# Patient Record
Sex: Female | Born: 1965
Health system: Southern US, Community
[De-identification: ages and names within clinical notes are randomized; demographics above are authoritative.]

## PROBLEM LIST (undated history)

## (undated) DIAGNOSIS — E669 Obesity, unspecified: Secondary | ICD-10-CM

## (undated) DIAGNOSIS — N184 Chronic kidney disease, stage 4 (severe): Secondary | ICD-10-CM

## (undated) DIAGNOSIS — M1990 Unspecified osteoarthritis, unspecified site: Secondary | ICD-10-CM

## (undated) DIAGNOSIS — R0609 Other forms of dyspnea: Secondary | ICD-10-CM

## (undated) DIAGNOSIS — E785 Hyperlipidemia, unspecified: Secondary | ICD-10-CM

## (undated) DIAGNOSIS — I428 Other cardiomyopathies: Secondary | ICD-10-CM

## (undated) DIAGNOSIS — E739 Lactose intolerance, unspecified: Secondary | ICD-10-CM

## (undated) DIAGNOSIS — Z87442 Personal history of urinary calculi: Secondary | ICD-10-CM

## (undated) DIAGNOSIS — D509 Iron deficiency anemia, unspecified: Secondary | ICD-10-CM

## (undated) DIAGNOSIS — D219 Benign neoplasm of connective and other soft tissue, unspecified: Secondary | ICD-10-CM

## (undated) DIAGNOSIS — I10 Essential (primary) hypertension: Secondary | ICD-10-CM

## (undated) DIAGNOSIS — R600 Localized edema: Secondary | ICD-10-CM

## (undated) DIAGNOSIS — R0600 Dyspnea, unspecified: Secondary | ICD-10-CM

## (undated) DIAGNOSIS — M549 Dorsalgia, unspecified: Secondary | ICD-10-CM

## (undated) DIAGNOSIS — Z973 Presence of spectacles and contact lenses: Secondary | ICD-10-CM

## (undated) DIAGNOSIS — K429 Umbilical hernia without obstruction or gangrene: Secondary | ICD-10-CM

## (undated) DIAGNOSIS — I493 Ventricular premature depolarization: Secondary | ICD-10-CM

## (undated) DIAGNOSIS — G4730 Sleep apnea, unspecified: Secondary | ICD-10-CM

## (undated) DIAGNOSIS — R0602 Shortness of breath: Secondary | ICD-10-CM

## (undated) DIAGNOSIS — K829 Disease of gallbladder, unspecified: Secondary | ICD-10-CM

## (undated) DIAGNOSIS — I499 Cardiac arrhythmia, unspecified: Secondary | ICD-10-CM

## (undated) DIAGNOSIS — I252 Old myocardial infarction: Secondary | ICD-10-CM

## (undated) DIAGNOSIS — I4891 Unspecified atrial fibrillation: Secondary | ICD-10-CM

## (undated) DIAGNOSIS — E11319 Type 2 diabetes mellitus with unspecified diabetic retinopathy without macular edema: Secondary | ICD-10-CM

## (undated) DIAGNOSIS — H269 Unspecified cataract: Secondary | ICD-10-CM

## (undated) DIAGNOSIS — R609 Edema, unspecified: Secondary | ICD-10-CM

## (undated) DIAGNOSIS — H35039 Hypertensive retinopathy, unspecified eye: Secondary | ICD-10-CM

## (undated) DIAGNOSIS — M2550 Pain in unspecified joint: Secondary | ICD-10-CM

## (undated) DIAGNOSIS — E119 Type 2 diabetes mellitus without complications: Secondary | ICD-10-CM

## (undated) HISTORY — DX: Unspecified cataract: H26.9

## (undated) HISTORY — DX: Unspecified osteoarthritis, unspecified site: M19.90

## (undated) HISTORY — DX: Dorsalgia, unspecified: M54.9

## (undated) HISTORY — DX: Pain in unspecified joint: M25.50

## (undated) HISTORY — DX: Type 2 diabetes mellitus with unspecified diabetic retinopathy without macular edema: E11.319

## (undated) HISTORY — PX: WISDOM TOOTH EXTRACTION: SHX21

## (undated) HISTORY — PX: CATARACT EXTRACTION: SUR2

## (undated) HISTORY — DX: Hyperlipidemia, unspecified: E78.5

## (undated) HISTORY — PX: CHOLECYSTECTOMY: SHX55

## (undated) HISTORY — DX: Lactose intolerance, unspecified: E73.9

## (undated) HISTORY — DX: Hypertensive retinopathy, unspecified eye: H35.039

## (undated) HISTORY — DX: Shortness of breath: R06.02

## (undated) HISTORY — DX: Old myocardial infarction: I25.2

## (undated) HISTORY — DX: Disease of gallbladder, unspecified: K82.9

---
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
## 2019-10-20 ENCOUNTER — Emergency Department (HOSPITAL_COMMUNITY): Payer: Self-pay

## 2019-10-20 ENCOUNTER — Other Ambulatory Visit: Payer: Self-pay

## 2019-10-20 ENCOUNTER — Encounter (HOSPITAL_COMMUNITY): Payer: Self-pay

## 2019-10-20 ENCOUNTER — Inpatient Hospital Stay (HOSPITAL_COMMUNITY)
Admission: EM | Admit: 2019-10-20 | Discharge: 2019-11-12 | DRG: 286 | Disposition: A | Discharge status: Home / Self Care | Payer: Self-pay | Attending: Internal Medicine | Admitting: Internal Medicine

## 2019-10-20 DIAGNOSIS — R8271 Bacteriuria: Secondary | ICD-10-CM | POA: Diagnosis present

## 2019-10-20 DIAGNOSIS — Z7901 Long term (current) use of anticoagulants: Secondary | ICD-10-CM

## 2019-10-20 DIAGNOSIS — N179 Acute kidney failure, unspecified: Secondary | ICD-10-CM | POA: Diagnosis present

## 2019-10-20 DIAGNOSIS — R739 Hyperglycemia, unspecified: Secondary | ICD-10-CM

## 2019-10-20 DIAGNOSIS — I248 Other forms of acute ischemic heart disease: Secondary | ICD-10-CM | POA: Diagnosis present

## 2019-10-20 DIAGNOSIS — I10 Essential (primary) hypertension: Secondary | ICD-10-CM

## 2019-10-20 DIAGNOSIS — K420 Umbilical hernia with obstruction, without gangrene: Secondary | ICD-10-CM | POA: Diagnosis present

## 2019-10-20 DIAGNOSIS — E1122 Type 2 diabetes mellitus with diabetic chronic kidney disease: Secondary | ICD-10-CM | POA: Diagnosis present

## 2019-10-20 DIAGNOSIS — I5023 Acute on chronic systolic (congestive) heart failure: Secondary | ICD-10-CM | POA: Diagnosis present

## 2019-10-20 DIAGNOSIS — I493 Ventricular premature depolarization: Secondary | ICD-10-CM | POA: Diagnosis present

## 2019-10-20 DIAGNOSIS — N1831 Chronic kidney disease, stage 3a: Secondary | ICD-10-CM | POA: Diagnosis present

## 2019-10-20 DIAGNOSIS — E119 Type 2 diabetes mellitus without complications: Secondary | ICD-10-CM

## 2019-10-20 DIAGNOSIS — D696 Thrombocytopenia, unspecified: Secondary | ICD-10-CM | POA: Diagnosis present

## 2019-10-20 DIAGNOSIS — Z79899 Other long term (current) drug therapy: Secondary | ICD-10-CM

## 2019-10-20 DIAGNOSIS — M25551 Pain in right hip: Secondary | ICD-10-CM

## 2019-10-20 DIAGNOSIS — R778 Other specified abnormalities of plasma proteins: Secondary | ICD-10-CM

## 2019-10-20 DIAGNOSIS — N3946 Mixed incontinence: Secondary | ICD-10-CM | POA: Diagnosis present

## 2019-10-20 DIAGNOSIS — R946 Abnormal results of thyroid function studies: Secondary | ICD-10-CM | POA: Diagnosis present

## 2019-10-20 DIAGNOSIS — D509 Iron deficiency anemia, unspecified: Secondary | ICD-10-CM | POA: Diagnosis present

## 2019-10-20 DIAGNOSIS — I130 Hypertensive heart and chronic kidney disease with heart failure and stage 1 through stage 4 chronic kidney disease, or unspecified chronic kidney disease: Secondary | ICD-10-CM | POA: Diagnosis present

## 2019-10-20 DIAGNOSIS — E1165 Type 2 diabetes mellitus with hyperglycemia: Secondary | ICD-10-CM | POA: Diagnosis present

## 2019-10-20 DIAGNOSIS — I509 Heart failure, unspecified: Secondary | ICD-10-CM

## 2019-10-20 DIAGNOSIS — I429 Cardiomyopathy, unspecified: Secondary | ICD-10-CM | POA: Diagnosis present

## 2019-10-20 DIAGNOSIS — E876 Hypokalemia: Secondary | ICD-10-CM | POA: Diagnosis not present

## 2019-10-20 DIAGNOSIS — Z87442 Personal history of urinary calculi: Secondary | ICD-10-CM

## 2019-10-20 DIAGNOSIS — Q519 Congenital malformation of uterus and cervix, unspecified: Secondary | ICD-10-CM

## 2019-10-20 DIAGNOSIS — N200 Calculus of kidney: Secondary | ICD-10-CM | POA: Diagnosis present

## 2019-10-20 DIAGNOSIS — D638 Anemia in other chronic diseases classified elsewhere: Secondary | ICD-10-CM | POA: Diagnosis present

## 2019-10-20 DIAGNOSIS — E8809 Other disorders of plasma-protein metabolism, not elsewhere classified: Secondary | ICD-10-CM | POA: Diagnosis present

## 2019-10-20 DIAGNOSIS — D259 Leiomyoma of uterus, unspecified: Secondary | ICD-10-CM | POA: Diagnosis present

## 2019-10-20 DIAGNOSIS — Z9114 Patient's other noncompliance with medication regimen: Secondary | ICD-10-CM

## 2019-10-20 DIAGNOSIS — T502X5A Adverse effect of carbonic-anhydrase inhibitors, benzothiadiazides and other diuretics, initial encounter: Secondary | ICD-10-CM | POA: Diagnosis not present

## 2019-10-20 DIAGNOSIS — I480 Paroxysmal atrial fibrillation: Principal | ICD-10-CM | POA: Diagnosis present

## 2019-10-20 DIAGNOSIS — Z20828 Contact with and (suspected) exposure to other viral communicable diseases: Secondary | ICD-10-CM | POA: Diagnosis present

## 2019-10-20 DIAGNOSIS — Z452 Encounter for adjustment and management of vascular access device: Secondary | ICD-10-CM

## 2019-10-20 DIAGNOSIS — I071 Rheumatic tricuspid insufficiency: Secondary | ICD-10-CM | POA: Diagnosis present

## 2019-10-20 DIAGNOSIS — Z794 Long term (current) use of insulin: Secondary | ICD-10-CM

## 2019-10-20 DIAGNOSIS — I4891 Unspecified atrial fibrillation: Secondary | ICD-10-CM | POA: Diagnosis present

## 2019-10-20 DIAGNOSIS — Z6841 Body Mass Index (BMI) 40.0 and over, adult: Secondary | ICD-10-CM

## 2019-10-20 DIAGNOSIS — R601 Generalized edema: Secondary | ICD-10-CM

## 2019-10-20 HISTORY — DX: Type 2 diabetes mellitus without complications: E11.9

## 2019-10-20 HISTORY — DX: Heart failure, unspecified: I50.9

## 2019-10-20 HISTORY — DX: Essential (primary) hypertension: I10

## 2019-10-20 LAB — CBG MONITORING, ED
Glucose-Capillary: 303 mg/dL — ABNORMAL HIGH (ref 70–99)
Glucose-Capillary: 328 mg/dL — ABNORMAL HIGH (ref 70–99)

## 2019-10-20 LAB — CBC WITH DIFFERENTIAL/PLATELET
Abs Immature Granulocytes: 0.02 10*3/uL (ref 0.00–0.07)
Basophils Absolute: 0.1 10*3/uL (ref 0.0–0.1)
Basophils Relative: 1 %
Eosinophils Absolute: 0.1 10*3/uL (ref 0.0–0.5)
Eosinophils Relative: 2 %
HCT: 39.7 % (ref 36.0–46.0)
Hemoglobin: 11.9 g/dL — ABNORMAL LOW (ref 12.0–15.0)
Immature Granulocytes: 0 %
Lymphocytes Relative: 19 %
Lymphs Abs: 1.2 10*3/uL (ref 0.7–4.0)
MCH: 24.1 pg — ABNORMAL LOW (ref 26.0–34.0)
MCHC: 30 g/dL (ref 30.0–36.0)
MCV: 80.5 fL (ref 80.0–100.0)
Monocytes Absolute: 0.6 10*3/uL (ref 0.1–1.0)
Monocytes Relative: 9 %
Neutro Abs: 4.3 10*3/uL (ref 1.7–7.7)
Neutrophils Relative %: 69 %
Platelets: UNDETERMINED 10*3/uL (ref 150–400)
RBC: 4.93 MIL/uL (ref 3.87–5.11)
RDW: 18.9 % — ABNORMAL HIGH (ref 11.5–15.5)
WBC: 6.2 10*3/uL (ref 4.0–10.5)
nRBC: 0 % (ref 0.0–0.2)

## 2019-10-20 LAB — POC URINE PREG, ED: Preg Test, Ur: NEGATIVE

## 2019-10-20 LAB — TSH: TSH: 4.672 u[IU]/mL — ABNORMAL HIGH (ref 0.350–4.500)

## 2019-10-20 LAB — BASIC METABOLIC PANEL
Anion gap: 11 (ref 5–15)
BUN: 31 mg/dL — ABNORMAL HIGH (ref 6–20)
CO2: 24 mmol/L (ref 22–32)
Calcium: 9.1 mg/dL (ref 8.9–10.3)
Chloride: 105 mmol/L (ref 98–111)
Creatinine, Ser: 1.76 mg/dL — ABNORMAL HIGH (ref 0.44–1.00)
GFR calc Af Amer: 38 mL/min — ABNORMAL LOW (ref >60)
GFR calc non Af Amer: 33 mL/min — ABNORMAL LOW (ref >60)
Glucose, Bld: 355 mg/dL — ABNORMAL HIGH (ref 70–99)
Potassium: 3.4 mmol/L — ABNORMAL LOW (ref 3.5–5.1)
Sodium: 140 mmol/L (ref 135–145)

## 2019-10-20 LAB — TROPONIN I (HIGH SENSITIVITY)
Troponin I (High Sensitivity): 103 ng/L (ref <18)
Troponin I (High Sensitivity): 83 ng/L — ABNORMAL HIGH (ref <18)

## 2019-10-20 LAB — BRAIN NATRIURETIC PEPTIDE: B Natriuretic Peptide: 1623.1 pg/mL — ABNORMAL HIGH (ref 0.0–100.0)

## 2019-10-20 LAB — MAGNESIUM: Magnesium: 2.1 mg/dL (ref 1.7–2.4)

## 2019-10-20 MED ORDER — ACETAMINOPHEN 325 MG PO TABS
650.0000 mg | ORAL_TABLET | Freq: Four times a day (QID) | ORAL | Status: DC | PRN
  Administered 2019-10-21 – 2019-11-05 (×5): 650 mg via ORAL
  Filled 2019-10-20 (×6): qty 2

## 2019-10-20 MED ORDER — FUROSEMIDE 10 MG/ML IJ SOLN
20.0000 mg | Freq: Two times a day (BID) | INTRAMUSCULAR | Status: DC
  Administered 2019-10-21 – 2019-10-22 (×3): 20 mg via INTRAVENOUS
  Filled 2019-10-20 (×2): qty 2
  Filled 2019-10-20: qty 4

## 2019-10-20 MED ORDER — TRAMADOL HCL 50 MG PO TABS
50.0000 mg | ORAL_TABLET | Freq: Four times a day (QID) | ORAL | Status: DC | PRN
  Administered 2019-10-21: 50 mg via ORAL
  Filled 2019-10-20: qty 1

## 2019-10-20 MED ORDER — POTASSIUM CHLORIDE CRYS ER 20 MEQ PO TBCR
40.0000 meq | EXTENDED_RELEASE_TABLET | Freq: Once | ORAL | Status: AC
  Administered 2019-10-21: 40 meq via ORAL
  Filled 2019-10-20: qty 2

## 2019-10-20 MED ORDER — METOPROLOL TARTRATE 5 MG/5ML IV SOLN
5.0000 mg | INTRAVENOUS | Status: DC | PRN
  Administered 2019-10-20: 5 mg via INTRAVENOUS
  Filled 2019-10-20: qty 5

## 2019-10-20 MED ORDER — ACETAMINOPHEN 650 MG RE SUPP: 650.0000 mg | Freq: Four times a day (QID) | RECTAL | Status: DC | PRN

## 2019-10-20 MED ORDER — FUROSEMIDE 10 MG/ML IJ SOLN
20.0000 mg | Freq: Once | INTRAMUSCULAR | Status: AC
  Administered 2019-10-20: 20 mg via INTRAVENOUS
  Filled 2019-10-20: qty 4

## 2019-10-20 MED ORDER — INSULIN ASPART 100 UNIT/ML ~~LOC~~ SOLN
0.0000 [IU] | Freq: Three times a day (TID) | SUBCUTANEOUS | Status: DC
  Administered 2019-10-21: 3 [IU] via SUBCUTANEOUS
  Administered 2019-10-21: 9 [IU] via SUBCUTANEOUS
  Administered 2019-10-21: 3 [IU] via SUBCUTANEOUS
  Administered 2019-10-21: 2 [IU] via SUBCUTANEOUS
  Administered 2019-10-22: 3 [IU] via SUBCUTANEOUS
  Administered 2019-10-22: 2 [IU] via SUBCUTANEOUS
  Administered 2019-10-22 – 2019-10-23 (×3): 3 [IU] via SUBCUTANEOUS
  Administered 2019-10-23: 5 [IU] via SUBCUTANEOUS
  Administered 2019-10-24: 12:00:00 3 [IU] via SUBCUTANEOUS
  Administered 2019-10-24: 5 [IU] via SUBCUTANEOUS
  Administered 2019-10-24: 2 [IU] via SUBCUTANEOUS
  Administered 2019-10-25: 5 [IU] via SUBCUTANEOUS
  Administered 2019-10-25: 2 [IU] via SUBCUTANEOUS
  Administered 2019-10-25 – 2019-10-26 (×2): 5 [IU] via SUBCUTANEOUS
  Administered 2019-10-26 – 2019-10-27 (×3): 3 [IU] via SUBCUTANEOUS
  Administered 2019-10-27 – 2019-10-28 (×2): 2 [IU] via SUBCUTANEOUS
  Administered 2019-10-28: 3 [IU] via SUBCUTANEOUS
  Administered 2019-10-28: 2 [IU] via SUBCUTANEOUS
  Administered 2019-10-29: 4 [IU] via SUBCUTANEOUS
  Administered 2019-10-29 – 2019-10-30 (×4): 2 [IU] via SUBCUTANEOUS
  Administered 2019-10-31: 1 [IU] via SUBCUTANEOUS
  Administered 2019-10-31: 12:00:00 2 [IU] via SUBCUTANEOUS
  Administered 2019-10-31 – 2019-11-01 (×2): 1 [IU] via SUBCUTANEOUS
  Administered 2019-11-01: 3 [IU] via SUBCUTANEOUS
  Administered 2019-11-01: 1 [IU] via SUBCUTANEOUS
  Administered 2019-11-02: 3 [IU] via SUBCUTANEOUS
  Administered 2019-11-02: 2 [IU] via SUBCUTANEOUS
  Administered 2019-11-02 – 2019-11-03 (×2): 3 [IU] via SUBCUTANEOUS
  Administered 2019-11-03 – 2019-11-04 (×5): 2 [IU] via SUBCUTANEOUS
  Administered 2019-11-05: 1 [IU] via SUBCUTANEOUS
  Administered 2019-11-05: 2 [IU] via SUBCUTANEOUS
  Administered 2019-11-05: 5 [IU] via SUBCUTANEOUS
  Administered 2019-11-06: 3 [IU] via SUBCUTANEOUS
  Administered 2019-11-06: 2 [IU] via SUBCUTANEOUS
  Administered 2019-11-06: 3 [IU] via SUBCUTANEOUS
  Administered 2019-11-07 – 2019-11-08 (×3): 2 [IU] via SUBCUTANEOUS
  Administered 2019-11-08: 1 [IU] via SUBCUTANEOUS
  Administered 2019-11-08 – 2019-11-11 (×8): 2 [IU] via SUBCUTANEOUS
  Administered 2019-11-11: 1 [IU] via SUBCUTANEOUS
  Administered 2019-11-11: 3 [IU] via SUBCUTANEOUS
  Administered 2019-11-12: 07:00:00 1 [IU] via SUBCUTANEOUS
  Filled 2019-10-20: qty 0.09

## 2019-10-20 MED ORDER — METOPROLOL TARTRATE 5 MG/5ML IV SOLN
5.0000 mg | Freq: Once | INTRAVENOUS | Status: AC
  Administered 2019-10-20: 5 mg via INTRAVENOUS
  Filled 2019-10-20: qty 5

## 2019-10-20 MED ORDER — INSULIN ASPART 100 UNIT/ML ~~LOC~~ SOLN
0.0000 [IU] | Freq: Every day | SUBCUTANEOUS | Status: DC
  Administered 2019-10-20: 4 [IU] via SUBCUTANEOUS
  Administered 2019-10-21: 2 [IU] via SUBCUTANEOUS
  Administered 2019-10-22: 3 [IU] via SUBCUTANEOUS
  Administered 2019-10-23: 2 [IU] via SUBCUTANEOUS
  Administered 2019-10-24: 4 [IU] via SUBCUTANEOUS
  Administered 2019-10-25 – 2019-10-26 (×2): 3 [IU] via SUBCUTANEOUS
  Administered 2019-10-27 – 2019-11-05 (×5): 2 [IU] via SUBCUTANEOUS
  Administered 2019-11-10: 3 [IU] via SUBCUTANEOUS
  Filled 2019-10-20: qty 0.05

## 2019-10-20 NOTE — ED Triage Notes (Signed)
Pt presents with c/o fall that occurred on Thursday night. Pt reports she was not initially feeling the pain after the fall but is now feeling pain in her right thigh. Pt does report pain with ambulation.

## 2019-10-20 NOTE — H&P (Signed)
History and Physical    Adrienne Lane ALP:[id number] DOB: [birth year] DOA: 10/20/2019  PCP: Patient, No Pcp Per Patient coming from: Home  Chief Complaint: Right hip pain  HPI: Adrienne Lane is a 53 y.o. female with medical history significant of type 2 diabetes and hypertension presenting with a chief complaint of right hip pain.  Patient states 2 days ago she was trying to get out of her [place] and her leg got stuck.  She lost her balance and fell on her right hip.  Since then she has been having pain in this area when standing or walking.  No pain at rest.  Denies head injury from the fall.  Denies any headaches or neck pain.  Denies loss of consciousness.  States she moved from [place] to [place] in December 2019 and could no longer qualify for Medicaid here.  Since then, she has not seen a doctor and has not been on any medications for her diabetes and hypertension.  She was previously on insulin for her diabetes.  States she was told about 2.5 to 3 years ago that her heart rate was irregular.  She was referred to cardiology back in [place] about 1.5 years ago but it was just an initial visit and she was not started on any medications.  States her legs have been swollen and abdomen distended for the past 4 months.  Her abdomen feels sore all over.  No nausea, vomiting, or diarrhea.  She is tolerating p.o. intake and requesting food to eat.  Also endorsing orthopnea.  Denies chest pain.  No other complaints.  ED Course: Found to be in A. fib with RVR with rate up to 140s.  Blood pressure elevated.  No leukocytosis.  Hemoglobin 11.9, MCV 80.5.  Potassium 3.4.  Magnesium normal.  Blood glucose 355.  Bicarb 24, anion gap 11.  BUN 31, creatinine 1.7.  TSH borderline elevated at 4.672.  High-sensitivity troponin 103.  BNP 1623.  Chest x-ray without pulmonary edema.  SARS-CoV-2 test pending.  X-ray of right hip and pelvis negative for fracture or dislocation. Patient received IV Lasix 20 mg and  IV Lopressor 5 mg.  Review of Systems:  All systems reviewed and apart from history of presenting illness, are negative.  Past Medical History:  Diagnosis Date  . Diabetes mellitus without complication ([place])   . Hypertension     Past Surgical History:  Procedure Laterality Date  . CHOLECYSTECTOMY       reports that she has never smoked. She has never used smokeless tobacco. She reports that she does not drink alcohol or use drugs.  No Known Allergies  Family History  Problem Relation Age of Onset  . Atrial fibrillation Mother     Prior to Admission medications   Not on File    Physical Exam: Vitals:   10/20/19 2107 10/20/19 2135 10/20/19 2230 10/20/19 2245  BP: (!) 152/105 (!) 181/114 (!) 143/101   Pulse: 82  (!) 124 83  Resp: (!) 1 (!) 25 18 (!) 24  SpO2: 95% 100% 99% 99%    Physical Exam  Constitutional: She is oriented to person, place, and time. She appears well-developed and well-nourished. No distress.  HENT:  Head: Normocephalic.  Eyes: Right eye exhibits no discharge. Left eye exhibits no discharge.  Neck: Neck supple.  Cardiovascular: Intact distal pulses.  Heart rate in the 120s Irregularly irregular rhythm  Pulmonary/Chest: Effort normal and breath sounds normal. No respiratory distress. She has no wheezes. She  has no rales.  Abdominal: Soft. Bowel sounds are normal. She exhibits distension.  Mild generalized tenderness to palpation No guarding, rebound, or rigidity  Musculoskeletal:        General: Edema present.     Comments: +3 pitting edema of bilateral lower extremities  Neurological: She is alert and oriented to person, place, and time.  Skin: Skin is warm and dry. She is not diaphoretic.     Labs on Admission: I have personally reviewed following labs and imaging studies  CBC: Recent Labs  Lab 10/20/19 1958  WBC 6.2  NEUTROABS 4.3  HGB 11.9*  HCT 39.7  MCV 80.5  PLT PLATELET CLUMPS NOTED ON SMEAR, UNABLE TO ESTIMATE   Basic  Metabolic Panel: Recent Labs  Lab 10/20/19 1958 10/20/19 2028  NA 140  --   K 3.4*  --   CL 105  --   CO2 24  --   GLUCOSE 355*  --   BUN 31*  --   CREATININE 1.76*  --   CALCIUM 9.1  --   MG  --  2.1   GFR: CrCl cannot be calculated (Unknown ideal weight.). Liver Function Tests: No results for input(s): AST, ALT, ALKPHOS, BILITOT, PROT, ALBUMIN in the last 168 hours. No results for input(s): LIPASE, AMYLASE in the last 168 hours. No results for input(s): AMMONIA in the last 168 hours. Coagulation Profile: No results for input(s): INR, PROTIME in the last 168 hours. Cardiac Enzymes: No results for input(s): CKTOTAL, CKMB, CKMBINDEX, TROPONINI in the last 168 hours. BNP (last 3 results) No results for input(s): PROBNP in the last 8760 hours. HbA1C: No results for input(s): HGBA1C in the last 72 hours. CBG: Recent Labs  Lab 10/20/19 2036 10/20/19 2324  GLUCAP 303* 328*   Lipid Profile: No results for input(s): CHOL, HDL, LDLCALC, TRIG, CHOLHDL, LDLDIRECT in the last 72 hours. Thyroid Function Tests: Recent Labs    10/20/19 2028  TSH 4.672*   Anemia Panel: No results for input(s): VITAMINB12, FOLATE, FERRITIN, TIBC, IRON, RETICCTPCT in the last 72 hours. Urine analysis: No results found for: COLORURINE, APPEARANCEUR, LABSPEC, [place], GLUCOSEU, HGBUR, BILIRUBINUR, KETONESUR, PROTEINUR, UROBILINOGEN, NITRITE, LEUKOCYTESUR  Radiological Exams on Admission: Dg Chest 1 View  Result Date: 10/20/2019 CLINICAL DATA:  Fall 2 days ago.  Shortness of breath. EXAM: CHEST  1 VIEW COMPARISON:  None. FINDINGS: The heart size and mediastinal contours are within normal limits. Both lungs are clear. No evidence of pneumothorax or pleural effusion. The visualized skeletal structures are unremarkable. IMPRESSION: No active disease. Electronically Signed   By: Marlaine Hind M.D.   On: 10/20/2019 22:07   Dg Hip Unilat With Pelvis 2-3 Views Right  Result Date: 10/20/2019 CLINICAL  DATA:  Fall 2 days ago. Right hip pain. Initial encounter. EXAM: DG HIP (WITH OR WITHOUT PELVIS) 2-3V RIGHT COMPARISON:  None. FINDINGS: There is no evidence of hip fracture or dislocation. There is no evidence of arthropathy or other focal bone abnormality. IMPRESSION: Negative. Electronically Signed   By: Marlaine Hind M.D.   On: 10/20/2019 22:07    EKG: Independently reviewed.  EKG with irregular rhythm and some visible P waves.  Heart rate 147.  Assessment/Plan Principal Problem:   Atrial fibrillation with rapid ventricular response (HCC) Active Problems:   CHF (congestive heart failure) (HCC)   Elevated troponin   AKI (acute kidney injury) (HCC)   Hyperglycemia   Probable A. fib with RVR Possibly precipitated by volume overload from new onset CHF.  EKG  with irregular rhythm although does show some visible P waves.  Rate up to 140s on arrival to the ED.  Per patient, she has had an irregular heart rhythm for the past 2.5-3 years and was previously referred to cardiology in [place].  Currently not on a rate control agent or anticoagulation.  CHA2DS2VASc 3. -Cardiac monitoring -Received IV Lopressor 5 mg in the ED.  Continues to be in A. fib with rate in the 120s.  Blood pressure stable.  Will give additional dose of Lopressor.  If rate still uncontrolled, will need Cardizem infusion. -IV Heparin for anticoagulation -Echocardiogram -Repeat EKG in a.m. -Consult cardiology in a.m.  New onset CHF Patient reports 66-month history of bilateral lower extremity edema, orthopnea, and abdominal distention.  Chest x-ray without pulmonary edema.  However, BNP significantly elevated at 1623. -Received IV Lasix 20 mg daily.  Continue IV Lasix 20 mg twice daily starting in the morning. -Echocardiogram -Monitor intake and output, daily weights, low-sodium diet with fluid restriction  Elevated troponin Likely secondary to demand ischemia in the setting of A. fib with RVR and new onset CHF.   High-sensitivity troponin 103.  EKG not suggestive of ACS.  Patient denies chest pain and appears comfortable. -Cardiac monitoring -Trend troponin  AKI BUN 31, creatinine 1.7.  Baseline creatinine 0.9-1.0 in 2019 per care everywhere.  Possibly cardiorenal due to decompensated CHF and vascular congestion from volume overload. -IV Lasix as above -Monitor urine output -Avoid nephrotoxic agents/contrast  Hyperglycemia in the setting of untreated type 2 diabetes Patient was previously on insulin but has not been on any medications since December 2019 due to lack of insurance.  Blood glucose elevated at 355.  No signs of DKA. -Check A1c.  Sliding scale insulin sensitive with bedtime coverage.  Elevated blood pressure in the setting of untreated hypertension Patient has not been on any antihypertensives since December 2019.  Received Lopressor for A. fib with RVR.  Systolic currently in 176H to 150s. -IV Lopressor as needed, may need Cardizem infusion if heart rate continues to be uncontrolled.  Right hip pain secondary to recent mechanical fall X-ray of right hip and pelvis negative for fracture or dislocation. -Tylenol as needed, tramadol as needed -PT evaluation -Fall precautions  Normocytic anemia Stable.  Hemoglobin 11.9, MCV 80.5.  Hemoglobin ranging between 9.3-10.3 in 2019 per care everywhere. -Check iron, ferritin, TIBC -Patient will need outpatient GI follow-up for screening colonoscopy given age above 40.  Mild hypokalemia Potassium 3.4.  Magnesium normal. -Replete potassium.  Continue to monitor electrolytes.  Borderline elevated TSH TSH borderline elevated at 4.672. -Check free T4 level  Patient needs prescription drug assistance -Care management consult  HIV screening The patient falls between the ages of 13-64 and should be screened for HIV, therefore HIV testing ordered.  DVT prophylaxis: Heparin Code Status: Full code Family Communication: No family available.  Disposition Plan: Anticipate discharge after clinical improvement. Consults called: None Admission status: It is my clinical opinion that admission to INPATIENT is reasonable and necessary in this 53 y.o. female . presenting with probable A. fib with RVR, new onset CHF, AKI, hyperglycemia in the setting of untreated type 2 diabetes, and elevated blood pressure in the setting of untreated hypertension.  Will need cardiology consultation in a.m.  Given the aforementioned, the predictability of an adverse outcome is felt to be significant. I expect that the patient will require at least 2 midnights in the hospital to treat this condition.   The medical decision making on  this patient was of high complexity and the patient is at high risk for clinical deterioration, therefore this is a level 3 visit.  Shela Leff MD [facility] Pager [phone]  If 7PM-7AM, please contact night-coverage [email or web address] Password [email or web address]  10/21/2019, 12:02 AM

## 2019-10-20 NOTE — ED Provider Notes (Signed)
[facility] DEPT Provider Note   CSN: [id number] Arrival date & time: 10/20/19  1531     History   Chief Complaint Chief Complaint  Patient presents with  . Fall    HPI Adrienne Lane is a 53 y.o. female.  She has a history of diabetes and hypertension and has been off her meds for about 10 months due to lack of insurance.  She said she had a fall 2 nights ago and landed on her right buttock and since then has had pain with ambulation.  Most of the pain occurs when she is transitioning from sitting to standing.  She says her legs feel weaker since then.  She has noticed increased shortness of breath especially when lying down flat that been going on for months.  She denies any chest pain.     The history is provided by the patient.  Fall This is a new problem. The current episode started 2 days ago. The problem occurs constantly. The problem has not changed since onset.Associated symptoms include shortness of breath. Pertinent negatives include no chest pain, no abdominal pain and no headaches. The symptoms are aggravated by standing and walking. The symptoms are relieved by rest. She has tried rest for the symptoms. The treatment provided mild relief.    Past Medical History:  Diagnosis Date  . Diabetes mellitus without complication ([place])   . Hypertension     There are no active problems to display for this patient.   Past Surgical History:  Procedure Laterality Date  . CHOLECYSTECTOMY       OB History   No obstetric history on file.      Home Medications    Prior to Admission medications   Not on File    Family History No family history on file.  Social History Social History   Tobacco Use  . Smoking status: Never Smoker  . Smokeless tobacco: Never Used  Substance Use Topics  . Alcohol use: Never    Frequency: Never  . Drug use: Never     Allergies   Patient has no known allergies.   Review of Systems Review of  Systems  Constitutional: Negative for fever.  HENT: Negative for sore throat.   Eyes: Negative for visual disturbance.  Respiratory: Positive for shortness of breath.   Cardiovascular: Negative for chest pain.  Gastrointestinal: Negative for abdominal pain.  Genitourinary: Negative for dysuria.  Musculoskeletal: Negative for back pain.  Skin: Negative for rash.  Neurological: Positive for tremors. Negative for headaches.     Physical Exam Updated Vital Signs BP (!) 139/114   Pulse (!) 105   Temp 97.8 F (36.6 C) (Oral)   Resp 14   Ht 5\' 8"  (1.727 m)   Wt 127 kg   LMP 10/20/2019 (Approximate)   SpO2 99%   BMI 42.57 kg/m   Physical Exam Vitals signs and nursing note reviewed.  Constitutional:      General: She is not in acute distress.    Appearance: She is well-developed.  HENT:     Head: Normocephalic and atraumatic.  Eyes:     Conjunctiva/sclera: Conjunctivae normal.  Neck:     Musculoskeletal: Neck supple.  Cardiovascular:     Rate and Rhythm: Tachycardia present. Rhythm irregular.     Pulses: Normal pulses.     Heart sounds: No murmur.  Pulmonary:     Effort: Pulmonary effort is normal. No respiratory distress.     Breath sounds: Normal breath sounds.  Abdominal:     Palpations: Abdomen is soft.     Tenderness: There is no abdominal tenderness. There is no guarding.  Musculoskeletal:        General: No deformity.     Right lower leg: Edema present.     Left lower leg: Edema present.  Skin:    General: Skin is warm and dry.     Capillary Refill: Capillary refill takes less than 2 seconds.  Neurological:     General: No focal deficit present.     Mental Status: She is alert.     Sensory: No sensory deficit.     Motor: No weakness.      ED Treatments / Results  Labs (all labs ordered are listed, but only abnormal results are displayed) Labs Reviewed  BASIC METABOLIC PANEL - Abnormal; Notable for the following components:      Result Value    Potassium 3.4 (*)    Glucose, Bld 355 (*)    BUN 31 (*)    Creatinine, Ser 1.76 (*)    GFR calc non Af Amer 33 (*)    GFR calc Af Amer 38 (*)    All other components within normal limits  CBC WITH DIFFERENTIAL/PLATELET - Abnormal; Notable for the following components:   Hemoglobin 11.9 (*)    MCH 24.1 (*)    RDW 18.9 (*)    All other components within normal limits  TSH - Abnormal; Notable for the following components:   TSH 4.672 (*)    All other components within normal limits  BRAIN NATRIURETIC PEPTIDE - Abnormal; Notable for the following components:   B Natriuretic Peptide 1,623.1 (*)    All other components within normal limits  HEMOGLOBIN A1C - Abnormal; Notable for the following components:   Hgb A1c MFr Bld 11.7 (*)    All other components within normal limits  CBC - Abnormal; Notable for the following components:   Hemoglobin 10.4 (*)    HCT 35.1 (*)    MCH 23.9 (*)    MCHC 29.6 (*)    RDW 18.6 (*)    All other components within normal limits  BASIC METABOLIC PANEL - Abnormal; Notable for the following components:   Glucose, Bld 180 (*)    BUN 33 (*)    Creatinine, Ser 1.90 (*)    GFR calc non Af Amer 30 (*)    GFR calc Af Amer 35 (*)    All other components within normal limits  APTT - Abnormal; Notable for the following components:   aPTT >200 (*)    All other components within normal limits  PROTIME-INR - Abnormal; Notable for the following components:   Prothrombin Time 18.2 (*)    INR 1.5 (*)    All other components within normal limits  PROTIME-INR - Abnormal; Notable for the following components:   Prothrombin Time 16.9 (*)    INR 1.4 (*)    All other components within normal limits  APTT - Abnormal; Notable for the following components:   aPTT 132 (*)    All other components within normal limits  CBG MONITORING, ED - Abnormal; Notable for the following components:   Glucose-Capillary 303 (*)    All other components within normal limits  CBG  MONITORING, ED - Abnormal; Notable for the following components:   Glucose-Capillary 328 (*)    All other components within normal limits  CBG MONITORING, ED - Abnormal; Notable for the following components:   Glucose-Capillary 379 (*)  All other components within normal limits  CBG MONITORING, ED - Abnormal; Notable for the following components:   Glucose-Capillary 225 (*)    All other components within normal limits  TROPONIN I (HIGH SENSITIVITY) - Abnormal; Notable for the following components:   Troponin I (High Sensitivity) 103 (*)    All other components within normal limits  TROPONIN I (HIGH SENSITIVITY) - Abnormal; Notable for the following components:   Troponin I (High Sensitivity) 83 (*)    All other components within normal limits  SARS CORONAVIRUS 2 (TAT 6-24 HRS)  MAGNESIUM  IRON AND TIBC  FERRITIN  HEPARIN LEVEL (UNFRACTIONATED)  T4, FREE  POC URINE PREG, ED    EKG EKG Interpretation  Date/Time:  Saturday October 20 2019 20:21:30 EDT Ventricular Rate:  149 PR Interval:    QRS Duration: 65 QT Interval:  271 QTC Calculation: 427 R Axis:   10 Text Interpretation: probable afib with RVR Nonspecific T abnormalities, lateral leads No old tracing to compare Confirmed by Aletta Edouard [phone]) on 10/20/2019 8:30:16 PM   Radiology Dg Chest 1 View  Result Date: 10/20/2019 CLINICAL DATA:  Fall 2 days ago.  Shortness of breath. EXAM: CHEST  1 VIEW COMPARISON:  None. FINDINGS: The heart size and mediastinal contours are within normal limits. Both lungs are clear. No evidence of pneumothorax or pleural effusion. The visualized skeletal structures are unremarkable. IMPRESSION: No active disease. Electronically Signed   By: Marlaine Hind M.D.   On: 10/20/2019 22:07   Dg Hip Unilat With Pelvis 2-3 Views Right  Result Date: 10/20/2019 CLINICAL DATA:  Fall 2 days ago. Right hip pain. Initial encounter. EXAM: DG HIP (WITH OR WITHOUT PELVIS) 2-3V RIGHT COMPARISON:  None.  FINDINGS: There is no evidence of hip fracture or dislocation. There is no evidence of arthropathy or other focal bone abnormality. IMPRESSION: Negative. Electronically Signed   By: Marlaine Hind M.D.   On: 10/20/2019 22:07    Procedures .Critical Care Performed by: Hayden Rasmussen, MD Authorized by: Hayden Rasmussen, MD   Critical care provider statement:    Critical care time (minutes):  45   Critical care was necessary to treat or prevent imminent or life-threatening deterioration of the following conditions:  Circulatory failure   Critical care was time spent personally by me on the following activities:  Discussions with consultants, evaluation of patient's response to treatment, examination of patient, ordering and performing treatments and interventions, ordering and review of laboratory studies, ordering and review of radiographic studies, pulse oximetry, re-evaluation of patient's condition, obtaining history from patient or surrogate, review of old charts and development of treatment plan with patient or surrogate   (including critical care time)  Medications Ordered in ED Medications  furosemide (LASIX) injection 20 mg (20 mg Intravenous Given 10/21/19 0837)  acetaminophen (TYLENOL) tablet 650 mg (has no administration in time range)    Or  acetaminophen (TYLENOL) suppository 650 mg (has no administration in time range)  insulin aspart (novoLOG) injection 0-9 Units (3 Units Subcutaneous Given 10/21/19 0836)  insulin aspart (novoLOG) injection 0-5 Units (4 Units Subcutaneous Given 10/20/19 2356)  traMADol (ULTRAM) tablet 50 mg (50 mg Oral Given 10/21/19 0039)  heparin ADULT infusion 100 units/mL (25000 units/271mL sodium chloride 0.45%) (1,200 Units/hr Intravenous New Bag/Given 10/21/19 0148)  diltiazem (CARDIZEM) injection 5 mg (has no administration in time range)  metoprolol tartrate (LOPRESSOR) injection 5 mg (5 mg Intravenous Given 10/20/19 2137)  furosemide (LASIX) injection  20 mg (20 mg Intravenous Given  10/20/19 2255)  potassium chloride SA (KLOR-CON) CR tablet 40 mEq (40 mEq Oral Given 10/21/19 0001)  heparin bolus via infusion 5,000 Units (5,000 Units Intravenous Bolus from Bag 10/21/19 0145)     Initial Impression / Assessment and Plan / ED Course  I have reviewed the triage vital signs and the nursing notes.  Pertinent labs & imaging results that were available during my care of the patient were reviewed by me and considered in my medical decision making (see chart for details).  Clinical Course as of Oct 20 940  Sat Oct 20, 5651  6817 53 year old female with untreated diabetes hypertension here after a fall with pain in her right hip and buttock area.  She is tacky here irregular in the 140s, cannot lay down flat because she gets so short of breath.  Fingerstick as her sugar at 330.  She said she has no PCP and no insurance.  Ultimately I think she is probably getting need to be admitted to the hospital.  Order some IV Lopressor to see if we can slow her down a little bit although she got particularly symptomatic as far as having chest pain.  Her rhythm does show some P waves but I think ultimately is probably A. fib.  She said she is always been a regular and never had it tested so she likely has been in greater than 72 hours and would not consider cardioversion.  She will probably need admission to the hospital for cardiology evaluation and will need social work case management regarding getting her on medications and outpatient follow-up.  We will sign this out to the oncoming team.   [MB]  2150 Patient has some vital signs in the record that show her heart rate being in the 80s and 90s.  Every time that have been in the room she is consistently been 120-150.   [MB]    Clinical Course User Index [MB] Hayden Rasmussen, MD      CHA2DS2/VAS Stroke Risk Points  Current as of 51 minutes ago     3 >= 2 Points: High Risk  1 - 1.99 Points: Medium Risk  0  Points: Low Risk    The previous score was 2 on 10/20/2019.: Last Change:     Details    This score determines the patient's risk of having a stroke if the  patient has atrial fibrillation.       Points Metrics  1 Has Congestive Heart Failure:  Yes    Current as of 51 minutes ago  0 Has Vascular Disease:  No    Current as of 51 minutes ago  1 Has Hypertension:  Yes    Current as of 51 minutes ago  0 Age:  42    Current as of 51 minutes ago  0 Has Diabetes:  No    Current as of 51 minutes ago  0 Had Stroke:  No  Had TIA:  No  Had thromboembolism:  No    Current as of 51 minutes ago  1 Female:  Yes    Current as of 51 minutes ago            Final Clinical Impressions(s) / ED Diagnoses   Final diagnoses:  Atrial fibrillation with RVR ([place])  Essential hypertension  Hyperglycemia  Right hip pain  Elevated troponin    ED Discharge Orders    None       Hayden Rasmussen, MD 10/21/19 [phone]

## 2019-10-20 NOTE — ED Notes (Signed)
Date and time results received: 10/20/19 22:10 (use smartphrase ".now" to insert current time)  Test: Tropnin Critical Value: 103  Name of Provider Notified: Eulis Foster

## 2019-10-20 NOTE — ED Notes (Signed)
Changed pt acuity from level 4 to 2.

## 2019-10-20 NOTE — Progress Notes (Signed)
ANTICOAGULATION CONSULT NOTE - Initial Consult  Pharmacy Consult for IV heparin Indication: atrial fibrillation  No Known Allergies  Patient Measurements:   Heparin Dosing Weight: 83 kg  Vital Signs: BP: 143/101 (10/31 2230) Pulse Rate: 83 (10/31 2245)  Labs: Recent Labs    10/20/19 1958 10/20/19 2028  HGB 11.9*  --   HCT 39.7  --   PLT PLATELET CLUMPS NOTED ON SMEAR, UNABLE TO ESTIMATE  --   CREATININE 1.76*  --   TROPONINIHS  --  103*    CrCl cannot be calculated (Unknown ideal weight.).   Medical History: Past Medical History:  Diagnosis Date  . Diabetes mellitus without complication ([place])   . Hypertension     Medications:  Scheduled:  . [START ON 10/21/2019] furosemide  20 mg Intravenous BID  . insulin aspart  0-5 Units Subcutaneous QHS  . [START ON 10/21/2019] insulin aspart  0-9 Units Subcutaneous TID WC  . potassium chloride  40 mEq Oral Once   Infusions:    Assessment: 40 yoF s/p fall on 10/29 now c/o right hip/buttock pain found to be in a-fib with RVR. Baseline labs: H/H=11.9/39.7, plts = clumped, aptt = pending INR = 1.5  Goal of Therapy:  Heparin level 0.3-0.7 units/ml Monitor platelets by anticoagulation protocol: Yes   Plan:  Baseline aptt, INR, wt, ht STAT Heparin 5000 unit IV bolus x1 Start drip at 1200 units/hr Daily CBC/HL Check 1st HL in 6 hours   Dorrene German 10/20/2019,11:14 PM

## 2019-10-20 NOTE — ED Provider Notes (Signed)
Care assumed from Dr. Aletta Edouard at shift change with labs and imaging pending.   In brief, this patient is a 53 y.o. F with PMH/o DM and HTN (non-compliant with meds x 1 year) who presents for evaluation of fall that occurred about 2 nights ago.  She reports that she landed on her right buttock and has since had pain to the area that is worse with movement.  Please see note from previous provider for full history/physical exam.    Physical Exam  BP (!) 143/101   Pulse 83   Resp (!) 24   LMP 10/20/2019 (Approximate)   SpO2 99%   Physical Exam   Tachycardic between 106-110 No evidence of respiratory distress.  ED Course/Procedures   Clinical Course as of Oct 20 2315  Sat Oct 19, 1336  8036 53 year old female with untreated diabetes hypertension here after a fall with pain in her right hip and buttock area.  She is tacky here irregular in the 140s, cannot lay down flat because she gets so short of breath.  Fingerstick as her sugar at 330.  She said she has no PCP and no insurance.  Ultimately I think she is probably getting need to be admitted to the hospital.  Order some IV Lopressor to see if we can slow her down a little bit although she got particularly symptomatic as far as having chest pain.  Her rhythm does show some P waves but I think ultimately is probably A. fib.  She said she is always been a regular and never had it tested so she likely has been in greater than 72 hours and would not consider cardioversion.  She will probably need admission to the hospital for cardiology evaluation and will need social work case management regarding getting her on medications and outpatient follow-up.  We will sign this out to the oncoming team.   [MB]  2150 Patient has some vital signs in the record that show her heart rate being in the 80s and 90s.  Every time that have been in the room she is consistently been 120-150.   [MB]    Clinical Course User Index [MB] Hayden Rasmussen, MD     Procedures   Results for orders placed or performed during the hospital encounter of 10/20/19 (from the past 24 hour(s))  Basic metabolic panel     Status: Abnormal   Collection Time: 10/20/19  7:58 PM  Result Value Ref Range   Sodium 140 135 - 145 mmol/L   Potassium 3.4 (L) 3.5 - 5.1 mmol/L   Chloride 105 98 - 111 mmol/L   CO2 24 22 - 32 mmol/L   Glucose, Bld 355 (H) 70 - 99 mg/dL   BUN 31 (H) 6 - 20 mg/dL   Creatinine, Ser 1.76 (H) 0.44 - 1.00 mg/dL   Calcium 9.1 8.9 - 10.3 mg/dL   GFR calc non Af Amer 33 (L) >60 mL/min   GFR calc Af Amer 38 (L) >60 mL/min   Anion gap 11 5 - 15  CBC with Differential     Status: Abnormal   Collection Time: 10/20/19  7:58 PM  Result Value Ref Range   WBC 6.2 4.0 - 10.5 K/uL   RBC 4.93 3.87 - 5.11 MIL/uL   Hemoglobin 11.9 (L) 12.0 - 15.0 g/dL   HCT 39.7 36.0 - 46.0 %   MCV 80.5 80.0 - 100.0 fL   MCH 24.1 (L) 26.0 - 34.0 pg   MCHC 30.0 30.0 -  36.0 g/dL   RDW 18.9 (H) 11.5 - 15.5 %   Platelets PLATELET CLUMPS NOTED ON SMEAR, UNABLE TO ESTIMATE 150 - 400 K/uL   nRBC 0.0 0.0 - 0.2 %   Neutrophils Relative % 69 %   Neutro Abs 4.3 1.7 - 7.7 K/uL   Lymphocytes Relative 19 %   Lymphs Abs 1.2 0.7 - 4.0 K/uL   Monocytes Relative 9 %   Monocytes Absolute 0.6 0.1 - 1.0 K/uL   Eosinophils Relative 2 %   Eosinophils Absolute 0.1 0.0 - 0.5 K/uL   Basophils Relative 1 %   Basophils Absolute 0.1 0.0 - 0.1 K/uL   Immature Granulocytes 0 %   Abs Immature Granulocytes 0.02 0.00 - 0.07 K/uL  TSH     Status: Abnormal   Collection Time: 10/20/19  8:28 PM  Result Value Ref Range   TSH 4.672 (H) 0.350 - 4.500 uIU/mL  Magnesium     Status: None   Collection Time: 10/20/19  8:28 PM  Result Value Ref Range   Magnesium 2.1 1.7 - 2.4 mg/dL  Troponin I (High Sensitivity)     Status: Abnormal   Collection Time: 10/20/19  8:28 PM  Result Value Ref Range   Troponin I (High Sensitivity) 103 (HH) <18 ng/L  Brain natriuretic peptide     Status: Abnormal    Collection Time: 10/20/19  8:29 PM  Result Value Ref Range   B Natriuretic Peptide 1,623.1 (H) 0.0 - 100.0 pg/mL  CBG monitoring, ED     Status: Abnormal   Collection Time: 10/20/19  8:36 PM  Result Value Ref Range   Glucose-Capillary 303 (H) 70 - 99 mg/dL  POC Urine Pregnancy, ED (not at [facility])     Status: None   Collection Time: 10/20/19  9:37 PM  Result Value Ref Range   Preg Test, Ur NEGATIVE NEGATIVE    EKG Interpretation  Date/Time:  Saturday October 20 2019 20:21:30 EDT Ventricular Rate:  149 PR Interval:    QRS Duration: 65 QT Interval:  271 QTC Calculation: 427 R Axis:   10 Text Interpretation: probable afib with RVR Nonspecific T abnormalities, lateral leads No old tracing to compare Confirmed by Aletta Edouard [phone]) on 10/20/2019 8:30:16 PM   MDM   On initial ED arrival, patient was tachycardic with heart rate into the 140s.  EKG showed A. fib with RVR.  She was given metoprolol which slightly improved but continued to be tachycardic.  Additionally, she was hypertensive and slightly tachypneic.  She is symptomatic and states when she lays flat, she has difficulty breathing.  This does not appear to be the reason why she fell.  Will need work-up for A. fib as well as echo for evaluation of heart failure.  PLAN: Patient pending blood work and imaging.  Will likely need admission given uncontrolled A. fib, diabetes, hypertension.   MDM: Urine pregnancy negative.  BNP is elevated at 1623.1.  Lance Bosch is elevated at 103.  At this time, patient is not having any chest pain.  I suspect that this is most likely due to demand and from her prolonged A. fib with possible new onset heart failure.  CBC without any significant leukocytosis.  Hemoglobin is 11.9.  TSH is 4.672.  BMP shows BUN of 31, creatinine of 1.76.  Will hold off on fluids given concern of mild heart failure.  Chest x-ray negative for any infectious etiology.  Hip x-ray negative for any acute bony abnormality.  Will  give small dose  of Lasix.  Discussed patient with Dr. Marlowe Sax (hospitalist) who will accept patient for admission.   Portions of this note were generated with Lobby[employer]. Dictation errors may occur despite best attempts at proofreading.   1. Atrial fibrillation with RVR ([place])   2. Essential hypertension   3. Hyperglycemia   4. Right hip pain   5. Elevated troponin      Desma Mcgregor 10/20/19 2316    Hayden Rasmussen, MD 10/21/19 [phone]

## 2019-10-21 ENCOUNTER — Encounter (HOSPITAL_COMMUNITY): Payer: Self-pay

## 2019-10-21 ENCOUNTER — Inpatient Hospital Stay (HOSPITAL_COMMUNITY): Payer: Self-pay

## 2019-10-21 ENCOUNTER — Other Ambulatory Visit: Payer: Self-pay

## 2019-10-21 DIAGNOSIS — N179 Acute kidney failure, unspecified: Secondary | ICD-10-CM

## 2019-10-21 DIAGNOSIS — R778 Other specified abnormalities of plasma proteins: Secondary | ICD-10-CM

## 2019-10-21 DIAGNOSIS — I509 Heart failure, unspecified: Secondary | ICD-10-CM

## 2019-10-21 DIAGNOSIS — I4891 Unspecified atrial fibrillation: Secondary | ICD-10-CM | POA: Diagnosis present

## 2019-10-21 DIAGNOSIS — I361 Nonrheumatic tricuspid (valve) insufficiency: Secondary | ICD-10-CM

## 2019-10-21 DIAGNOSIS — R739 Hyperglycemia, unspecified: Secondary | ICD-10-CM

## 2019-10-21 DIAGNOSIS — I340 Nonrheumatic mitral (valve) insufficiency: Secondary | ICD-10-CM

## 2019-10-21 LAB — GLUCOSE, CAPILLARY
Glucose-Capillary: 197 mg/dL — ABNORMAL HIGH (ref 70–99)
Glucose-Capillary: 207 mg/dL — ABNORMAL HIGH (ref 70–99)

## 2019-10-21 LAB — CBG MONITORING, ED
Glucose-Capillary: 379 mg/dL — ABNORMAL HIGH (ref 70–99)
Glucose-Capillary: 225 mg/dL — ABNORMAL HIGH (ref 70–99)
Glucose-Capillary: 205 mg/dL — ABNORMAL HIGH (ref 70–99)

## 2019-10-21 LAB — HEMOGLOBIN A1C
Hgb A1c MFr Bld: 11.7 % — ABNORMAL HIGH (ref 4.8–5.6)
Mean Plasma Glucose: 289.09 mg/dL

## 2019-10-21 LAB — FERRITIN: Ferritin: 20 ng/mL (ref 11–307)

## 2019-10-21 LAB — HEPARIN LEVEL (UNFRACTIONATED)
Heparin Unfractionated: 0.55 IU/mL (ref 0.30–0.70)
Heparin Unfractionated: 0.13 IU/mL — ABNORMAL LOW (ref 0.30–0.70)

## 2019-10-21 LAB — BASIC METABOLIC PANEL
Anion gap: 10 (ref 5–15)
BUN: 33 mg/dL — ABNORMAL HIGH (ref 6–20)
CO2: 24 mmol/L (ref 22–32)
Calcium: 8.9 mg/dL (ref 8.9–10.3)
Chloride: 108 mmol/L (ref 98–111)
Creatinine, Ser: 1.9 mg/dL — ABNORMAL HIGH (ref 0.44–1.00)
GFR calc Af Amer: 35 mL/min — ABNORMAL LOW (ref >60)
GFR calc non Af Amer: 30 mL/min — ABNORMAL LOW (ref >60)
Glucose, Bld: 180 mg/dL — ABNORMAL HIGH (ref 70–99)
Potassium: 3.6 mmol/L (ref 3.5–5.1)
Sodium: 142 mmol/L (ref 135–145)

## 2019-10-21 LAB — PROTIME-INR
INR: 1.5 — ABNORMAL HIGH (ref 0.8–1.2)
INR: 1.4 — ABNORMAL HIGH (ref 0.8–1.2)
Prothrombin Time: 18.2 seconds — ABNORMAL HIGH (ref 11.4–15.2)
Prothrombin Time: 16.9 seconds — ABNORMAL HIGH (ref 11.4–15.2)

## 2019-10-21 LAB — IRON AND TIBC
Iron: 44 ug/dL (ref 28–170)
Saturation Ratios: 12 % (ref 10.4–31.8)
TIBC: 355 ug/dL (ref 250–450)
UIBC: 311 ug/dL

## 2019-10-21 LAB — T4, FREE: Free T4: 1.24 ng/dL — ABNORMAL HIGH (ref 0.61–1.12)

## 2019-10-21 LAB — ECHOCARDIOGRAM COMPLETE
Height: 68 in
Weight: 4480 oz

## 2019-10-21 LAB — CBC
HCT: 35.1 % — ABNORMAL LOW (ref 36.0–46.0)
Hemoglobin: 10.4 g/dL — ABNORMAL LOW (ref 12.0–15.0)
MCH: 23.9 pg — ABNORMAL LOW (ref 26.0–34.0)
MCHC: 29.6 g/dL — ABNORMAL LOW (ref 30.0–36.0)
MCV: 80.5 fL (ref 80.0–100.0)
Platelets: 173 10*3/uL (ref 150–400)
RBC: 4.36 MIL/uL (ref 3.87–5.11)
RDW: 18.6 % — ABNORMAL HIGH (ref 11.5–15.5)
WBC: 5.8 10*3/uL (ref 4.0–10.5)
nRBC: 0 % (ref 0.0–0.2)

## 2019-10-21 LAB — APTT
aPTT: >200 seconds (ref 24–36)
aPTT: 132 seconds — ABNORMAL HIGH (ref 24–36)

## 2019-10-21 LAB — SARS CORONAVIRUS 2 (TAT 6-24 HRS): SARS Coronavirus 2: NEGATIVE

## 2019-10-21 MED ORDER — DILTIAZEM HCL 25 MG/5ML IV SOLN
5.0000 mg | INTRAVENOUS | Status: DC | PRN
  Administered 2019-10-22: 5 mg via INTRAVENOUS
  Filled 2019-10-21 (×3): qty 5

## 2019-10-21 MED ORDER — METOPROLOL TARTRATE 25 MG PO TABS
25.0000 mg | ORAL_TABLET | Freq: Two times a day (BID) | ORAL | Status: DC
  Administered 2019-10-21 – 2019-10-22 (×3): 25 mg via ORAL
  Filled 2019-10-21 (×4): qty 1

## 2019-10-21 MED ORDER — INFLUENZA VAC SPLIT QUAD 0.5 ML IM SUSY: 0.5000 mL | PREFILLED_SYRINGE | INTRAMUSCULAR | Status: DC

## 2019-10-21 MED ORDER — HEPARIN BOLUS VIA INFUSION
5000.0000 [IU] | Freq: Once | INTRAVENOUS | Status: AC
  Administered 2019-10-21: 02:00:00 5000 [IU] via INTRAVENOUS
  Filled 2019-10-21: qty 5000

## 2019-10-21 MED ORDER — HEPARIN (PORCINE) 25000 UT/250ML-% IV SOLN
1200.0000 [IU]/h | INTRAVENOUS | Status: DC
  Administered 2019-10-21 (×2): 1200 [IU]/h via INTRAVENOUS
  Filled 2019-10-21 (×2): qty 250

## 2019-10-21 MED ORDER — HEPARIN BOLUS VIA INFUSION: 5000.0000 [IU] | Freq: Once | INTRAVENOUS | Status: DC

## 2019-10-21 NOTE — ED Notes (Signed)
Cardiologist at bedside, gave EKG.

## 2019-10-21 NOTE — ED Notes (Addendum)
Second EKG not needed until 0700 per hospitalist.

## 2019-10-21 NOTE — Progress Notes (Signed)
PROGRESS NOTE  Adrienne Lane EVO:[id number] DOB: May 04, 1966 DOA: 10/20/2019 PCP: Patient, No Pcp Per  Hospital Course/Subjective: 72F with history of DM, HTN without medical care since 2019 admitted after fall at home found to have new onset CHF as well as uncontrolled Afib RVR of unknown duration. She was given IV Lopressor, IV lasix and is doing well this morning without acute complaints. Has also been started on IV Heparin drip and is awaiting further workup and cardiology evaluation.   Assessment/Plan: Principal Problem:   Atrial fibrillation with rapid ventricular response (HCC) Active Problems:   CHF (congestive heart failure) (HCC)   Elevated troponin   AKI (acute kidney injury) ([place])   Hyperglycemia   Probable A. fib with RVR Possibly precipitated by volume overload from new onset CHF.  EKG with irregular rhythm although does show some visible P waves.  Rate up to 140s on arrival to the ED.  Per patient, she has had an irregular heart rhythm for the past 2.5-3 years and was previously referred to cardiology in [place].  Currently not on a rate control agent or anticoagulation.  CHA2DS2VASc 3. -Cardiac monitoring -Received IV Lopressor 5 mg in the ED.  Continues to be in A. fib with rate in the 100s.  Blood pressure stable.  Will give additional dose of Lopressor.  If rate still uncontrolled, will need Cardizem infusion. -IV Heparin for anticoagulation -Echocardiogram ordered -Consult cardiology in a.m., discussed with Dr. Acie Fredrickson  New onset CHF Patient reports 84-month history of bilateral lower extremity edema, orthopnea, and abdominal distention.  Chest x-ray without pulmonary edema.  However, BNP significantly elevated at 1623. -Received IV Lasix 20 mg daily.  Continue IV Lasix 20 mg twice daily and follow electrolytes and renal function -Echocardiogram as above -Monitor intake and output, daily weights, low-sodium diet with fluid restriction  Elevated troponin Likely  secondary to demand ischemia in the setting of A. fib with RVR and new onset CHF.  High-sensitivity troponin 103.  EKG not suggestive of ACS.  Patient denies chest pain and appears comfortable. -Cardiac monitoring -Trend troponin  AKI BUN 31, creatinine 1.7.  Baseline creatinine 0.9-1.0 in 2019 per care everywhere.  Possibly cardiorenal due to decompensated CHF and vascular congestion from volume overload. -IV Lasix as above -Monitor urine output -Avoid nephrotoxic agents/contrast  Hyperglycemia in the setting of untreated type 2 diabetes Patient was previously on insulin but has not been on any medications since December 2019 due to lack of insurance.  Blood glucose elevated at 355.  No signs of DKA. -Check A1c.  Sliding scale insulin sensitive with bedtime coverage.  Elevated blood pressure in the setting of untreated hypertension Patient has not been on any antihypertensives since December 2019.  Received Lopressor for A. fib with RVR.  Systolic currently in 299B to 150s. -IV Lopressor as needed, may need Cardizem infusion if heart rate continues to be uncontrolled.  Right hip pain secondary to recent mechanical fall X-ray of right hip and pelvis negative for fracture or dislocation. -Tylenol as needed, tramadol as needed -PT evaluation -Fall precautions  Normocytic anemia Stable.  Hemoglobin 11.9, MCV 80.5.  Hemoglobin ranging between 9.3-10.3 in 2019 per care everywhere. -Check iron, ferritin, TIBC -Patient will need outpatient GI follow-up for screening colonoscopy given age above 5.  Mild hypokalemia Potassium 3.4.  Magnesium normal. -Replete potassium.  Continue to monitor electrolytes.  Borderline elevated TSH TSH borderline elevated at 4.672. -Check free T4 level  Patient needs prescription drug assistance -Care management consult  HIV screening The patient falls between the ages of 13-64 and should be screened for HIV, therefore HIV testing ordered.   DVT Prophylaxis: Heparin gtt for Afib  Code Status: FULL   Family Communication: None present, discussed at length with patient and all questions answered.   Disposition Plan: Inpatient care for now, anticipate discharge home when medically ready.   Consultants:  Cardiology   Procedures:  None   Antimicrobials:  None    Objective: Vitals:   10/21/19 0710 10/21/19 0730 10/21/19 0747 10/21/19 0830  BP:  (!) 134/98 (!) 134/98 (!) 139/114  Pulse: (!) 39  98 (!) 105  Resp: 17 18 18 14   Temp: 97.8 F (36.6 C)     TempSrc: Oral     SpO2: 97%  97% 99%  Weight:      Height:       No intake or output data in the 24 hours ending 10/21/19 1044 Filed Weights   10/21/19 0046  Weight: 127 kg     Exam: General:  Alert, oriented, calm, in no acute distress, on room air Eyes: EOMI, clear sclerea Neck: supple, no masses, trachea mildline  Cardiovascular: Rate 102, irregular in Afib, no murmurs or rubs, she has 2+ pitting BLE edema up to above the knees Respiratory: clear to auscultation bilaterally, no wheezes, no crackles  Abdomen: soft, nontender, nondistended, normal bowel tones heard  Skin: dry, no rashes  Musculoskeletal: no joint effusions, normal range of motion  Psychiatric: appropriate affect, normal speech  Neurologic: extraocular muscles intact, clear speech, moving all extremities with intact sensorium    Data Reviewed: CBC: Recent Labs  Lab 10/20/19 1958 10/21/19 0703  WBC 6.2 5.8  NEUTROABS 4.3  --   HGB 11.9* 10.4*  HCT 39.7 35.1*  MCV 80.5 80.5  PLT PLATELET CLUMPS NOTED ON SMEAR, UNABLE TO ESTIMATE 621   Basic Metabolic Panel: Recent Labs  Lab 10/20/19 1958 10/20/19 2028 10/21/19 0703  NA 140  --  142  K 3.4*  --  3.6  CL 105  --  108  CO2 24  --  24  GLUCOSE 355*  --  180*  BUN 31*  --  33*  CREATININE 1.76*  --  1.90*  CALCIUM 9.1  --  8.9  MG  --  2.1  --    GFR: Estimated Creatinine Clearance: 49.3 mL/min (A) (by C-G formula based  on SCr of 1.9 mg/dL (H)). Liver Function Tests: No results for input(s): AST, ALT, ALKPHOS, BILITOT, PROT, ALBUMIN in the last 168 hours. No results for input(s): LIPASE, AMYLASE in the last 168 hours. No results for input(s): AMMONIA in the last 168 hours. Coagulation Profile: Recent Labs  Lab 10/20/19 2304 10/21/19 0710  INR 1.5* 1.4*   Cardiac Enzymes: No results for input(s): CKTOTAL, CKMB, CKMBINDEX, TROPONINI in the last 168 hours. BNP (last 3 results) No results for input(s): PROBNP in the last 8760 hours. HbA1C: Recent Labs    10/20/19 2306  HGBA1C 11.7*   CBG: Recent Labs  Lab 10/20/19 2036 10/20/19 2324 10/21/19 0157 10/21/19 0822  GLUCAP 303* 328* 379* 225*   Lipid Profile: No results for input(s): CHOL, HDL, LDLCALC, TRIG, CHOLHDL, LDLDIRECT in the last 72 hours. Thyroid Function Tests: Recent Labs    10/20/19 2028 10/21/19 0703  TSH 4.672*  --   FREET4  --  1.24*   Anemia Panel: Recent Labs    10/21/19 0703  FERRITIN 20  TIBC 355  IRON 44   Urine  analysis: No results found for: COLORURINE, APPEARANCEUR, LABSPEC, PHURINE, GLUCOSEU, HGBUR, BILIRUBINUR, KETONESUR, PROTEINUR, UROBILINOGEN, NITRITE, LEUKOCYTESUR Sepsis Labs: @LABRCNTIP (procalcitonin:4,lacticidven:4)  ) Recent Results (from the past 240 hour(s))  SARS CORONAVIRUS 2 (TAT 6-24 HRS) Nasopharyngeal Nasopharyngeal Swab     Status: None   Collection Time: 10/20/19 10:45 PM   Specimen: Nasopharyngeal Swab  Result Value Ref Range Status   SARS Coronavirus 2 NEGATIVE NEGATIVE Final    Comment: (NOTE) SARS-CoV-2 target nucleic acids are NOT DETECTED. The SARS-CoV-2 RNA is generally detectable in upper and lower respiratory specimens during the acute phase of infection. Negative results do not preclude SARS-CoV-2 infection, do not rule out co-infections with other pathogens, and should not be used as the sole basis for treatment or other patient management decisions. Negative results  must be combined with clinical observations, patient history, and epidemiological information. The expected result is Negative. Fact Sheet for Patients: SugarRoll.be Fact Sheet for Healthcare Providers: https://www.woods-mathews.com/ This test is not yet approved or cleared by the [place] FDA and  has been authorized for detection and/or diagnosis of SARS-CoV-2 by FDA under an Emergency Use Authorization (EUA). This EUA will remain  in effect (meaning this test can be used) for the duration of the COVID-19 declaration under Section 56 4(b)(1) of the Act, 21 U.S.C. section 360bbb-3(b)(1), unless the authorization is terminated or revoked sooner. Performed at [facility], [place] [address] [place], [place] [address]      Studies: Dg Chest 1 View  Result Date: 10/20/2019 CLINICAL DATA:  Fall 2 days ago.  Shortness of breath. EXAM: CHEST  1 VIEW COMPARISON:  None. FINDINGS: The heart size and mediastinal contours are within normal limits. Both lungs are clear. No evidence of pneumothorax or pleural effusion. The visualized skeletal structures are unremarkable. IMPRESSION: No active disease. Electronically Signed   By: Marlaine Hind M.D.   On: 10/20/2019 22:07   Dg Hip Unilat With Pelvis 2-3 Views Right  Result Date: 10/20/2019 CLINICAL DATA:  Fall 2 days ago. Right hip pain. Initial encounter. EXAM: DG HIP (WITH OR WITHOUT PELVIS) 2-3V RIGHT COMPARISON:  None. FINDINGS: There is no evidence of hip fracture or dislocation. There is no evidence of arthropathy or other focal bone abnormality. IMPRESSION: Negative. Electronically Signed   By: Marlaine Hind M.D.   On: 10/20/2019 22:07    Scheduled Meds: . furosemide  20 mg Intravenous BID  . insulin aspart  0-5 Units Subcutaneous QHS  . insulin aspart  0-9 Units Subcutaneous TID WC    Continuous Infusions: . heparin 1,200 Units/hr (10/21/19 0148)     LOS: 1 day   Time spent: 25  minutes   Marry Guan, MD [facility] Pager [phone]  If 7PM-7AM, please contact night-coverage [email or web address] Password [facility] 10/21/2019, 10:44 AM

## 2019-10-21 NOTE — Progress Notes (Signed)
  Echocardiogram 2D Echocardiogram has been performed.  Jennette Dubin 10/21/2019, 1:04 PM

## 2019-10-21 NOTE — ED Notes (Signed)
Restarted heparin

## 2019-10-21 NOTE — ED Notes (Signed)
ECHO at bedside.

## 2019-10-21 NOTE — ED Notes (Addendum)
[place]rew aPTT

## 2019-10-21 NOTE — ED Notes (Signed)
Date and time results received: 10/21/19 3:43 AM   Test: aPTT Critical Value: >200  Name of Provider Notified: Messaged [place]

## 2019-10-21 NOTE — ED Notes (Signed)
Provider requested aPTT/INR be redrawn.

## 2019-10-21 NOTE — Consult Note (Addendum)
Cardiology Consult   Patient ID: Adrienne Lane MRN: [id number]; DOB: 1966/07/16   Admission date: 10/20/2019  Primary Care Provider: Patient, No Pcp Per Primary Cardiologist: No primary care provider on file.  Primary Electrophysiologist:  None   Chief Complaint:  Dyspnea  Patient Profile:   Adrienne Lane is a 53 y.o. female with hypertension, diabetes who we are asked to see by Dr. Hollice Gong for evaluation of new onset atrial fibrillation with RVR.  History of Present Illness:   Adrienne Lane is a 53 year old female with hypertension, diabetes who presented after a fall.  She moved from [place] to [place] in December 2019 and has not had any medical care since that time.  She previously was on medications for diabetes, including insulin, and hypertension, but has not been on anything over this last year.  She reported that 2 days ago she was getting out of a car and her leg got stuck.  She fell on her hip.  Since that time has been having hip pain.  No loss of consciousness.  On presentation to the ED she was found to be in AF with RVR, rates up to 140s.  Blood pressure elevated.  High-sensitivity troponin 103->83.  BNP 1623.  Creatinine 1.7.  Chest x-ray did not show any pulmonary edema.  X-ray of hip showed no fracture.  She was given IV Lasix 20 mg and IV Lopressor 5 mg.  She currently reports that she feels significantly improved after the IV Lasix.  She denies any dyspnea at rest, but does report that she feels short of breath with minimal exertion.  She states that the swelling in her legs has worsened over the last 4 to 5 months, and she has gained 20 pounds.    Heart Pathway Score:     Past Medical History:  Diagnosis Date  . Diabetes mellitus without complication ([place])   . Hypertension     Past Surgical History:  Procedure Laterality Date  . CHOLECYSTECTOMY       Medications Prior to Admission: Prior to Admission medications   Medication Sig Start Date End  Date Taking? Authorizing Provider  Insulin Glargine (BASAGLAR KWIKPEN) 100 UNIT/ML SOPN Inject 30 Units into the skin every morning.   Yes [provider]  lisinopril (ZESTRIL) 10 MG tablet Take 10 mg by mouth daily.   Yes [provider]  metoprolol succinate (TOPROL-XL) 50 MG 24 hr tablet Take 1 tablet by mouth daily.   Yes [provider]     Allergies:   No Known Allergies  Social History:   Social History   Socioeconomic History  . Marital status: Married    Spouse name: Not on file  . Number of children: Not on file  . Years of education: Not on file  . Highest education level: Not on file  Occupational History  . Not on file  Social Needs  . Financial resource strain: Not on file  . Food insecurity    Worry: Not on file    Inability: Not on file  . Transportation needs    Medical: Not on file    Non-medical: Not on file  Tobacco Use  . Smoking status: Never Smoker  . Smokeless tobacco: Never Used  Substance and Sexual Activity  . Alcohol use: Never    Frequency: Never  . Drug use: Never  . Sexual activity: Not on file  Lifestyle  . Physical activity    Days per week: Not on file  Minutes per session: Not on file  . Stress: Not on file  Relationships  . Social Herbalist on phone: Not on file    Gets together: Not on file    Attends religious service: Not on file    Active member of club or organization: Not on file    Attends meetings of clubs or organizations: Not on file    Relationship status: Not on file  . Intimate partner violence    Fear of current or ex partner: Not on file    Emotionally abused: Not on file    Physically abused: Not on file    Forced sexual activity: Not on file  Other Topics Concern  . Not on file  Social History Narrative  . Not on file    Family History:   The patient's family history includes Atrial fibrillation in her mother.    ROS:  Please see the history of present illness.   All other ROS reviewed and negative.     Physical Exam/Data:   Vitals:   10/21/19 0710 10/21/19 0730 10/21/19 0747 10/21/19 0830  BP:  (!) 134/98 (!) 134/98 (!) 139/114  Pulse: (!) 39  98 (!) 105  Resp: 17 18 18 14   Temp: 97.8 F (36.6 C)     TempSrc: Oral     SpO2: 97%  97% 99%  Weight:      Height:       No intake or output data in the 24 hours ending 10/21/19 1057 Last 3 Weights 10/21/2019  Weight (lbs) 280 lb  Weight (kg) 127.007 kg     Body mass index is 42.57 kg/m.  General:  Well nourished, well developed, in no acute distress HEENT: normal Lymph: no adenopathy Neck: + JVD Endocrine:  No thryomegaly Vascular: No carotid bruits Cardiac:  normal S1, S2; irregular, tachycardic, no murmur Lungs: Bibasilar crackles Abd: soft, nontender, no hepatomegaly  Ext: 2+ BLE edema  Musculoskeletal:  No deformities, BUE and BLE strength normal and equal Skin: warm and dry  Neuro:  CNs 2-12 intact, no focal abnormalities noted Psych:  Normal affect    EKG:  The ECG that was done was personally reviewed and demonstrates atrial fibrillation with RVR, rate 124.  Occasional sinus beat but then will have runs of AF.  Relevant CV Studies: None  Laboratory Data:  High Sensitivity Troponin:   Recent Labs  Lab 10/20/19 2028 10/20/19 2245  TROPONINIHS 103* 83*      Chemistry Recent Labs  Lab 10/20/19 1958 10/21/19 0703  NA 140 142  K 3.4* 3.6  CL 105 108  CO2 24 24  GLUCOSE 355* 180*  BUN 31* 33*  CREATININE 1.76* 1.90*  CALCIUM 9.1 8.9  GFRNONAA 33* 30*  GFRAA 38* 35*  ANIONGAP 11 10    No results for input(s): PROT, ALBUMIN, AST, ALT, ALKPHOS, BILITOT in the last 168 hours. Hematology Recent Labs  Lab 10/20/19 1958 10/21/19 0703  WBC 6.2 5.8  RBC 4.93 4.36  HGB 11.9* 10.4*  HCT 39.7 35.1*  MCV 80.5 80.5  MCH 24.1* 23.9*  MCHC 30.0 29.6*  RDW 18.9* 18.6*  PLT PLATELET CLUMPS NOTED ON SMEAR, UNABLE TO ESTIMATE 173   BNP Recent Labs  Lab 10/20/19  2029  BNP 1,623.1*    DDimer No results for input(s): DDIMER in the last 168 hours.   Radiology/Studies:  Dg Chest 1 View  Result Date: 10/20/2019 CLINICAL DATA:  Fall 2 days ago.  Shortness of  breath. EXAM: CHEST  1 VIEW COMPARISON:  None. FINDINGS: The heart size and mediastinal contours are within normal limits. Both lungs are clear. No evidence of pneumothorax or pleural effusion. The visualized skeletal structures are unremarkable. IMPRESSION: No active disease. Electronically Signed   By: Marlaine Hind M.D.   On: 10/20/2019 22:07   Dg Hip Unilat With Pelvis 2-3 Views Right  Result Date: 10/20/2019 CLINICAL DATA:  Fall 2 days ago. Right hip pain. Initial encounter. EXAM: DG HIP (WITH OR WITHOUT PELVIS) 2-3V RIGHT COMPARISON:  None. FINDINGS: There is no evidence of hip fracture or dislocation. There is no evidence of arthropathy or other focal bone abnormality. IMPRESSION: Negative. Electronically Signed   By: Marlaine Hind M.D.   On: 10/20/2019 22:07    Assessment and Plan:   Atrial fibrillation with RVR: New diagnosis.  Heart rate up to 140s on admission, currently improved to 90s to 110s.  Appears to have sinus beats followed by runs of AF vs AT.  Discussed rhythm with EP, cannot distinguish whether AF or AT, recommend treating as AF.  CHA2DS2-VASc score at least 2 given female and diabetes, also with suspected CHF -Continue heparin drip for now.  Would hold off on starting on oral anticoagulant for now as if systolic dysfunction on TTE will need ischemia evaluation -Start metoprolol 25 mg BID for rate control, can titrate as needed  Suspected heart failure: warm and wet on exam.  BNP 1623. - Agree with IV lasix for goal net negative 1-2 L daily - TTE ordered.  If systolic heart failure, will plan for ischemia evaluation, likely cath if renal function improves with diuresis  Elevated troponin: High-sensitivity troponin 103.  Likely demand ischemia in setting of AF with RVR and  suspected decompensated heart failure  AKI: Creatinine 1.7.  Baseline 1.0.  Concern for cardiorenal syndrome, continue diuresis as above  Hypertension: Elevated to 180s/110s on admission.  Has not been on any antihypertensives over the last year.  Currently improved to 140s/100s.  Adding metoprolol as above for rate control.  Plan to add ACE/ARB/ARNI pending results of TTE and improvement in renal function  Type 2 DM: A1c 11.7.  Has not had any treatment for her diabetes over last year, previously was on insulin.  Started on SSI.     For questions or updates, please contact Industry Please consult [email or web address] for contact info under      Signed, Donato Heinz, MD  10/21/2019 10:57 AM

## 2019-10-21 NOTE — Progress Notes (Addendum)
ANTICOAGULATION CONSULT NOTE  Pharmacy Consult for IV heparin Indication: atrial fibrillation  No Known Allergies  Patient Measurements: Height: 5\' 8"  (172.7 cm) Weight: 280 lb (127 kg) IBW/kg (Calculated) : 63.9 Heparin Dosing Weight: 83 kg  Vital Signs: Temp: 97.8 F (36.6 C) (11/01 0710) Temp Source: Oral (11/01 0710) BP: 139/114 (11/01 0830) Pulse Rate: 105 (11/01 0830)  Labs: Recent Labs    10/20/19 1958 10/20/19 2028 10/20/19 2245 10/20/19 2304 10/21/19 0703 10/21/19 0710  HGB 11.9*  --   --   --  10.4*  --   HCT 39.7  --   --   --  35.1*  --   PLT PLATELET CLUMPS NOTED ON SMEAR, UNABLE TO ESTIMATE  --   --   --  173  --   APTT  --   --   --  >200*  --  132*  LABPROT  --   --   --  18.2*  --  16.9*  INR  --   --   --  1.5*  --  1.4*  HEPARINUNFRC  --   --   --   --  0.55  --   CREATININE 1.76*  --   --   --  1.90*  --   TROPONINIHS  --  103* 83*  --   --   --     Estimated Creatinine Clearance: 49.3 mL/min (A) (by C-G formula based on SCr of 1.9 mg/dL (H)).   Medical History: Past Medical History:  Diagnosis Date  . Diabetes mellitus without complication ([place])   . Hypertension     Medications:  Scheduled:  . furosemide  20 mg Intravenous BID  . insulin aspart  0-5 Units Subcutaneous QHS  . insulin aspart  0-9 Units Subcutaneous TID WC   Infusions:  . heparin 1,200 Units/hr (10/21/19 0148)    Assessment: 64 yoF with PMH DM2, HTN, presents with R hip pain after falling. Also reports several months of LE edema, orthopnea, and abdominal distention. Found to have elevated BNP and to also be in AFib with RVR. Pharmacy to dose heparin   Baseline INR, aPTT: elevated; charted as being drawn before heparin started, but RN notes report drawing aPTT/INR while heparin running  Prior anticoagulation: none  Significant events:  Today, 10/21/2019:  CBC: Hgb slightly low at baseline and lower again today but still >10g; Plt stable WNL  Most recent heparin  level therapeutic on 1200 units/hr  No bleeding or infusion issues per nursing  Goal of Therapy: Heparin level 0.3-0.7 units/ml Monitor platelets by anticoagulation protocol: Yes  Plan:  Continue heparin IV infusion at 1200 units/hr  Recheck confirmatory heparin level in 8 hrs  Daily CBC, daily heparin level once stable  Monitor for signs of bleeding or thrombosis  Issues with coverage after moving to [place]; recommend CM consult to assist in determining preferred oral anticoagulant  Reuel Boom, PharmD, BCPS [phone] 10/21/2019, 9:44 AM   ADDENDUM  Confirmatory heparin level low at 0.13  Per floor RN, heparin running appropriately at 1200 units/hr and not informed of any pauses by ED RN  No infusion or bleeding issues  Plan:  2000 unit bolus  Increase heparin to 1450 units/hr  Recheck heparin level in 6 hrs  Reuel Boom, PharmD, BCPS [phone] 10/21/2019, 4:42 PM

## 2019-10-21 NOTE — Progress Notes (Signed)
   10/21/19 1503  Vitals  Temp (!) 97.5 F (36.4 C)  Temp Source Oral  BP (!) 170/84  MAP (mmHg) 104  BP Location Right Arm  BP Method Automatic  Patient Position (if appropriate) Sitting  Pulse Rate 92  Pulse Rate Source Monitor  Resp 18  Oxygen Therapy  SpO2 100 %  O2 Device Room Air  Pain Assessment  Pain Scale 0-10  Pain Score 0  POSS Scale ([place] Opioid Sedation Scale)  POSS *See Group Information* S-Acceptable,Sleep, easy to arouse   MEWS in the yellow. Protocol initiated. MD aware.

## 2019-10-21 NOTE — ED Notes (Addendum)
Stopped heparin to draw aPTT/INR and 5AM labs.

## 2019-10-21 NOTE — ED Notes (Signed)
RN unavailable for report at this time.

## 2019-10-21 NOTE — ED Notes (Signed)
Regarding the aPTT >200. I drew blood for the aPTT/INR after I started Heprin. I stopped the Heprin drip, flushed with 10 mL NS, then drew blood. I should have stopped Heprin, waited at least 15 mintues, then drew blood.

## 2019-10-22 DIAGNOSIS — M25551 Pain in right hip: Secondary | ICD-10-CM

## 2019-10-22 DIAGNOSIS — E119 Type 2 diabetes mellitus without complications: Secondary | ICD-10-CM

## 2019-10-22 DIAGNOSIS — I10 Essential (primary) hypertension: Secondary | ICD-10-CM

## 2019-10-22 LAB — COMPREHENSIVE METABOLIC PANEL
ALT: 22 U/L (ref 0–44)
AST: 23 U/L (ref 15–41)
Albumin: 2.8 g/dL — ABNORMAL LOW (ref 3.5–5.0)
Alkaline Phosphatase: 69 U/L (ref 38–126)
Anion gap: 11 (ref 5–15)
BUN: 37 mg/dL — ABNORMAL HIGH (ref 6–20)
CO2: 23 mmol/L (ref 22–32)
Calcium: 8.8 mg/dL — ABNORMAL LOW (ref 8.9–10.3)
Chloride: 104 mmol/L (ref 98–111)
Creatinine, Ser: 1.98 mg/dL — ABNORMAL HIGH (ref 0.44–1.00)
GFR calc Af Amer: 33 mL/min — ABNORMAL LOW (ref >60)
GFR calc non Af Amer: 29 mL/min — ABNORMAL LOW (ref >60)
Glucose, Bld: 226 mg/dL — ABNORMAL HIGH (ref 70–99)
Potassium: 3.4 mmol/L — ABNORMAL LOW (ref 3.5–5.1)
Sodium: 138 mmol/L (ref 135–145)
Total Bilirubin: 1.2 mg/dL (ref 0.3–1.2)
Total Protein: 6.5 g/dL (ref 6.5–8.1)

## 2019-10-22 LAB — CBC
HCT: 34.9 % — ABNORMAL LOW (ref 36.0–46.0)
Hemoglobin: 10.5 g/dL — ABNORMAL LOW (ref 12.0–15.0)
MCH: 24.3 pg — ABNORMAL LOW (ref 26.0–34.0)
MCHC: 30.1 g/dL (ref 30.0–36.0)
MCV: 80.8 fL (ref 80.0–100.0)
Platelets: 171 10*3/uL (ref 150–400)
RBC: 4.32 MIL/uL (ref 3.87–5.11)
RDW: 18.6 % — ABNORMAL HIGH (ref 11.5–15.5)
WBC: 6.7 10*3/uL (ref 4.0–10.5)
nRBC: 0 % (ref 0.0–0.2)

## 2019-10-22 LAB — GLUCOSE, CAPILLARY
Glucose-Capillary: 168 mg/dL — ABNORMAL HIGH (ref 70–99)
Glucose-Capillary: 204 mg/dL — ABNORMAL HIGH (ref 70–99)
Glucose-Capillary: 218 mg/dL — ABNORMAL HIGH (ref 70–99)
Glucose-Capillary: 259 mg/dL — ABNORMAL HIGH (ref 70–99)

## 2019-10-22 LAB — HEPARIN LEVEL (UNFRACTIONATED)
Heparin Unfractionated: 0.1 IU/mL — ABNORMAL LOW (ref 0.30–0.70)
Heparin Unfractionated: 0.27 IU/mL — ABNORMAL LOW (ref 0.30–0.70)
Heparin Unfractionated: 0.31 IU/mL (ref 0.30–0.70)

## 2019-10-22 LAB — MAGNESIUM: Magnesium: 1.8 mg/dL (ref 1.7–2.4)

## 2019-10-22 LAB — BASIC METABOLIC PANEL
Anion gap: 9 (ref 5–15)
BUN: 36 mg/dL — ABNORMAL HIGH (ref 6–20)
CO2: 23 mmol/L (ref 22–32)
Calcium: 8.9 mg/dL (ref 8.9–10.3)
Chloride: 106 mmol/L (ref 98–111)
Creatinine, Ser: 2.08 mg/dL — ABNORMAL HIGH (ref 0.44–1.00)
GFR calc Af Amer: 31 mL/min — ABNORMAL LOW (ref >60)
GFR calc non Af Amer: 27 mL/min — ABNORMAL LOW (ref >60)
Glucose, Bld: 251 mg/dL — ABNORMAL HIGH (ref 70–99)
Potassium: 4.3 mmol/L (ref 3.5–5.1)
Sodium: 138 mmol/L (ref 135–145)

## 2019-10-22 MED ORDER — HEPARIN BOLUS VIA INFUSION
2000.0000 [IU] | Freq: Once | INTRAVENOUS | Status: AC
  Administered 2019-10-22: 2000 [IU] via INTRAVENOUS
  Filled 2019-10-22: qty 2000

## 2019-10-22 MED ORDER — FUROSEMIDE 10 MG/ML IJ SOLN: 60.0000 mg | Freq: Two times a day (BID) | INTRAMUSCULAR | Status: DC

## 2019-10-22 MED ORDER — METOPROLOL TARTRATE 50 MG PO TABS
50.0000 mg | ORAL_TABLET | Freq: Four times a day (QID) | ORAL | Status: DC
  Administered 2019-10-22 – 2019-10-25 (×10): 50 mg via ORAL
  Filled 2019-10-22 (×12): qty 1

## 2019-10-22 MED ORDER — HEPARIN (PORCINE) 25000 UT/250ML-% IV SOLN
1750.0000 [IU]/h | INTRAVENOUS | Status: DC
  Administered 2019-10-23 – 2019-10-24 (×4): 1750 [IU]/h via INTRAVENOUS
  Filled 2019-10-22 (×4): qty 250

## 2019-10-22 MED ORDER — FUROSEMIDE 10 MG/ML IJ SOLN
60.0000 mg | Freq: Once | INTRAMUSCULAR | Status: AC
  Administered 2019-10-22: 60 mg via INTRAVENOUS
  Filled 2019-10-22: qty 6

## 2019-10-22 MED ORDER — FUROSEMIDE 10 MG/ML IJ SOLN
120.0000 mg | Freq: Once | INTRAVENOUS | Status: AC
  Administered 2019-10-22: 19:00:00 120 mg via INTRAVENOUS
  Filled 2019-10-22: qty 10

## 2019-10-22 MED ORDER — MAGNESIUM SULFATE 2 GM/50ML IV SOLN
2.0000 g | Freq: Once | INTRAVENOUS | Status: AC
  Administered 2019-10-22: 12:00:00 2 g via INTRAVENOUS
  Filled 2019-10-22: qty 50

## 2019-10-22 MED ORDER — DIPHENHYDRAMINE HCL 25 MG PO CAPS
25.0000 mg | ORAL_CAPSULE | Freq: Once | ORAL | Status: AC
  Administered 2019-10-22: 02:00:00 25 mg via ORAL
  Filled 2019-10-22: qty 1

## 2019-10-22 MED ORDER — HEPARIN (PORCINE) 25000 UT/250ML-% IV SOLN
1650.0000 [IU]/h | INTRAVENOUS | Status: DC
  Administered 2019-10-22: 1650 [IU]/h via INTRAVENOUS
  Filled 2019-10-22: qty 250

## 2019-10-22 MED ORDER — ISOSORB DINITRATE-HYDRALAZINE 20-37.5 MG PO TABS
2.0000 | ORAL_TABLET | Freq: Three times a day (TID) | ORAL | Status: DC
  Administered 2019-10-22 – 2019-10-27 (×17): 2 via ORAL
  Filled 2019-10-22 (×17): qty 2

## 2019-10-22 MED ORDER — HEPARIN (PORCINE) 25000 UT/250ML-% IV SOLN: 1450.0000 [IU]/h | INTRAVENOUS | Status: DC

## 2019-10-22 MED ORDER — POTASSIUM CHLORIDE CRYS ER 20 MEQ PO TBCR
40.0000 meq | EXTENDED_RELEASE_TABLET | Freq: Once | ORAL | Status: AC
  Administered 2019-10-22: 40 meq via ORAL
  Filled 2019-10-22 (×2): qty 2

## 2019-10-22 MED ORDER — FUROSEMIDE 10 MG/ML IJ SOLN
40.0000 mg | Freq: Two times a day (BID) | INTRAMUSCULAR | Status: DC
  Administered 2019-10-22: 40 mg via INTRAVENOUS
  Filled 2019-10-22: qty 4

## 2019-10-22 MED ORDER — POTASSIUM CHLORIDE CRYS ER 20 MEQ PO TBCR
40.0000 meq | EXTENDED_RELEASE_TABLET | Freq: Once | ORAL | Status: AC
  Administered 2019-10-22: 14:00:00 40 meq via ORAL
  Filled 2019-10-22: qty 2

## 2019-10-22 MED ORDER — INSULIN GLARGINE 100 UNIT/ML ~~LOC~~ SOLN
8.0000 [IU] | Freq: Every day | SUBCUTANEOUS | Status: DC
  Administered 2019-10-22: 8 [IU] via SUBCUTANEOUS
  Filled 2019-10-22 (×2): qty 0.08

## 2019-10-22 MED ORDER — HYDROMORPHONE HCL 1 MG/ML IJ SOLN: 1.0000 mg | Freq: Once | INTRAMUSCULAR | Status: DC

## 2019-10-22 MED ORDER — FUROSEMIDE 10 MG/ML IJ SOLN: 80.0000 mg | Freq: Once | INTRAMUSCULAR | Status: DC

## 2019-10-22 NOTE — Progress Notes (Signed)
PROGRESS NOTE    Adrienne Lane  MVE:[id number] DOB: [birth year] DOA: 10/20/2019 PCP: Patient, No Pcp Per   Brief Narrative:  HPI per Dr Yevonne Aline is a 53 y.o. female with medical history significant of type 2 diabetes and hypertension presented with a chief complaint of right hip pain.  Patient stated 2 days ago she was trying to get out of her [employer] and her leg got stuck.  She lost her balance and fell on her right hip.  Since then she has been having pain in this area when standing or walking.  No pain at rest.  Denied head injury from the fall.  Denied any headaches or neck pain.  Denied loss of consciousness.  Stated she moved from [place] to [place] in December 2019 and could no longer qualify for Medicaid here.  Since then, she has not seen a doctor and has not been on any medications for her diabetes and hypertension.  She was previously on insulin for her diabetes.  Stated she was told about 2.5 to 3 years ago that her heart rate was irregular.  She was referred to cardiology back in [place] about 1.5 years ago but it was just an initial visit and she was not started on any medications.  States her legs have been swollen and abdomen distended for the past 4 months.  Her abdomen feels sore all over.  No nausea, vomiting, or diarrhea.  She is tolerating p.o. intake and requesting food to eat.  Also endorsing orthopnea.  Denies chest pain.  No other complaints.  ED Course: Found to be in A. fib with RVR with rate up to 140s.  Blood pressure elevated.  No leukocytosis.  Hemoglobin 11.9, MCV 80.5.  Potassium 3.4.  Magnesium normal.  Blood glucose 355.  Bicarb 24, anion gap 11.  BUN 31, creatinine 1.7.  TSH borderline elevated at 4.672.  High-sensitivity troponin 103.  BNP 1623.  Chest x-ray without pulmonary edema.  SARS-CoV-2 test pending.  X-ray of right hip and pelvis negative for fracture or dislocation. Patient received IV Lasix 20 mg and IV Lopressor 5 mg.  Assessment  & Plan:   Principal Problem:   Atrial fibrillation with rapid ventricular response ([facility]) Active Problems:   CHF (congestive heart failure) (HCC)   Elevated troponin   AKI (acute kidney injury) ([place])   Hyperglycemia   Atrial fibrillation with RVR ([facility])  1 A. fib with RVR Questionable etiology.  TSH of 4.6.  T4 within normal limits.  2D echo which was done with a EF of 20 to 25% with wall motion abnormalities.  Patient currently on metoprolol 50 mg every 6 hours for rate control.  Patient on IV heparin for anticoagulation.  Cardiology following and appreciate input and recommendations.  2.  New onset systolic heart failure Questionable etiology.  Patient denies any ongoing chest pain.  Patient had presented with orthopnea, lower extremity edema, abdominal distention.  Patient volume overloaded on examination.  2D echo with a EF of 20 to 25% with mild LVH, left ventricular global hypokinesis.  Patient on Lasix 20 mg IV every 12 hours with an adequate urine output.  Urine output not being properly recorded.  Lasix increased to 120 mg IV twice daily per cardiology recommendations.  Continue metoprolol 50 mg every 6 hours for rate control for A. fib.  Cardiology adding BiDil.  Patient will likely need ischemic work-up however will defer to cardiology.  Cardiology following and appreciate input and recommendations.  3.  Elevated troponin Questionable etiology.  Patient with new onset systolic heart failure.  2D echo with a EF of 20 to 25% with wall motion abnormalities.  Patient on a beta-blocker.  Patient on IV Lasix.  BiDil been added to current regimen.  Patient likely needs an ischemic work-up and will defer to cardiology.  4.  Acute renal failure Likely prerenal azotemia secondary to acute CHF exacerbation.  Baseline creatinine was 0.9-1.0 in 2019.  Creatinine on admission at 1.7.  Creatinine currently at 1.98.  Will likely need increased diuresis with Lasix.  Lasix dose increased 220 mg IV every  12 hours.  Strict I's and O's.  Daily weights.  Follow urine output.  Follow.  5.  Hypokalemia Secondary to diuresis.  Replete.  6.  Poorly controlled diabetes mellitus type 2 Hemoglobin A1c 11.7.  Patient has been out of medications for almost 11 months as patient had moved from [place] down to [place] and [employer] and no recent follow-up with PCP.  CBG of 168 this morning.  Placed on Lantus 8 units daily.  Continue sliding scale insulin.  Follow.  7.  Right hip pain secondary to mechanical fall Plain films of the right hip negative for any fracture dislocation.  PT/OT.  Fall precautions.  Pain management.  8.  Normocytic anemia Anemia panel consistent with anemia of anemia of chronic disease.  Iron level of 44.  Ferritin of 20.  Hemoglobin of 10.5.  Follow H&H.    DVT prophylaxis: Heparin. Code Status: Full Family Communication: updated patient.  No family at bedside. Disposition Plan: Likely home when okay with cardiology.   Consultants:   Cardiology: Dr. Gardiner Rhyme 10/21/2019  Procedures:   2D echo 10/21/2019  Plain films of the right hip and pelvis 10/20/2019  CXR 10/20/2019  Antimicrobials:  None   Subjective: Patient sitting up on the side of the bed.  Patient denies any chest pain.  Still with lower extremity edema but slowly improving.  States abdominal swelling is slowly improving.  Urine output not properly recorded.  Denies any chest pain.  Objective: Vitals:   10/22/19 0652 10/22/19 0835 10/22/19 0843 10/22/19 1051  BP: (!) 139/103 (!) 139/104  (!) 157/108  Pulse: (!) 105 (!) 105 (!) 103 94  Resp: 16 18  20   Temp: 97.8 F (36.6 C)   98 F (36.7 C)  TempSrc: Oral   Oral  SpO2: 100% 98%  97%  Weight:      Height:        Intake/Output Summary (Last 24 hours) at 10/22/2019 1219 Last data filed at 10/22/2019 1206 Gross per 24 hour  Intake 1804.04 ml  Output 750 ml  Net 1054.04 ml   Filed Weights   10/21/19 0046 10/21/19 1503  10/22/19 0500  Weight: 127 kg (!) 148.8 kg (!) 149.6 kg    Examination:  General exam: Appears calm and comfortable  Respiratory system: Some bibasilar crackles.  No wheezing.  No rhonchi.  Cardiovascular system: S1 & S2 heard, RRR. No JVD, murmurs, rubs, gallops or clicks.  2-3+ bilateral lower extremity edema up to thighs. Gastrointestinal system: Abdomen is nondistended, soft and nontender. No organomegaly or masses felt. Normal bowel sounds heard. Central nervous system: Alert and oriented. No focal neurological deficits. Extremities: Symmetric 5 x 5 power. Skin: No rashes, lesions or ulcers Psychiatry: Judgement and insight appear normal. Mood & affect appropriate.     Data Reviewed: I have personally reviewed following labs and imaging studies  CBC:  Recent Labs  Lab 10/20/19 1958 10/21/19 0703 10/22/19 0014  WBC 6.2 5.8 6.7  NEUTROABS 4.3  --   --   HGB 11.9* 10.4* 10.5*  HCT 39.7 35.1* 34.9*  MCV 80.5 80.5 80.8  PLT PLATELET CLUMPS NOTED ON SMEAR, UNABLE TO ESTIMATE 173 169   Basic Metabolic Panel: Recent Labs  Lab 10/20/19 1958 10/20/19 2028 10/21/19 0703 10/22/19 0925  NA 140  --  142 138  K 3.4*  --  3.6 3.4*  CL 105  --  108 104  CO2 24  --  24 23  GLUCOSE 355*  --  180* 226*  BUN 31*  --  33* 37*  CREATININE 1.76*  --  1.90* 1.98*  CALCIUM 9.1  --  8.9 8.8*  MG  --  2.1  --  1.8   GFR: Estimated Creatinine Clearance: 52.1 mL/min (A) (by C-G formula based on SCr of 1.98 mg/dL (H)). Liver Function Tests: Recent Labs  Lab 10/22/19 0925  AST 23  ALT 22  ALKPHOS 69  BILITOT 1.2  PROT 6.5  ALBUMIN 2.8*   No results for input(s): LIPASE, AMYLASE in the last 168 hours. No results for input(s): AMMONIA in the last 168 hours. Coagulation Profile: Recent Labs  Lab 10/20/19 2304 10/21/19 0710  INR 1.5* 1.4*   Cardiac Enzymes: No results for input(s): CKTOTAL, CKMB, CKMBINDEX, TROPONINI in the last 168 hours. BNP (last 3 results) No results for  input(s): PROBNP in the last 8760 hours. HbA1C: Recent Labs    10/20/19 2306  HGBA1C 11.7*   CBG: Recent Labs  Lab 10/21/19 1152 10/21/19 1618 10/21/19 2121 10/22/19 0755 10/22/19 1201  GLUCAP 205* 197* 207* 168* 204*   Lipid Profile: No results for input(s): CHOL, HDL, LDLCALC, TRIG, CHOLHDL, LDLDIRECT in the last 72 hours. Thyroid Function Tests: Recent Labs    10/20/19 2028 10/21/19 0703  TSH 4.672*  --   FREET4  --  1.24*   Anemia Panel: Recent Labs    10/21/19 0703  FERRITIN 20  TIBC 355  IRON 44   Sepsis Labs: No results for input(s): PROCALCITON, LATICACIDVEN in the last 168 hours.  Recent Results (from the past 240 hour(s))  SARS CORONAVIRUS 2 (TAT 6-24 HRS) Nasopharyngeal Nasopharyngeal Swab     Status: None   Collection Time: 10/20/19 10:45 PM   Specimen: Nasopharyngeal Swab  Result Value Ref Range Status   SARS Coronavirus 2 NEGATIVE NEGATIVE Final    Comment: (NOTE) SARS-CoV-2 target nucleic acids are NOT DETECTED. The SARS-CoV-2 RNA is generally detectable in upper and lower respiratory specimens during the acute phase of infection. Negative results do not preclude SARS-CoV-2 infection, do not rule out co-infections with other pathogens, and should not be used as the sole basis for treatment or other patient management decisions. Negative results must be combined with clinical observations, patient history, and epidemiological information. The expected result is Negative. Fact Sheet for Patients: SugarRoll.be Fact Sheet for Healthcare Providers: https://www.woods-mathews.com/ This test is not yet approved or cleared by the [place] FDA and  has been authorized for detection and/or diagnosis of SARS-CoV-2 by FDA under an Emergency Use Authorization (EUA). This EUA will remain  in effect (meaning this test can be used) for the duration of the COVID-19 declaration under Section 56 4(b)(1) of the Act,  21 U.S.C. section 360bbb-3(b)(1), unless the authorization is terminated or revoked sooner. Performed at [facility], [address], [place], [place] [address]  Radiology Studies: Dg Chest 1 View  Result Date: 10/20/2019 CLINICAL DATA:  Fall 2 days ago.  Shortness of breath. EXAM: CHEST  1 VIEW COMPARISON:  None. FINDINGS: The heart size and mediastinal contours are within normal limits. Both lungs are clear. No evidence of pneumothorax or pleural effusion. The visualized skeletal structures are unremarkable. IMPRESSION: No active disease. Electronically Signed   By: Marlaine Hind M.D.   On: 10/20/2019 22:07   Dg Hip Unilat With Pelvis 2-3 Views Right  Result Date: 10/20/2019 CLINICAL DATA:  Fall 2 days ago. Right hip pain. Initial encounter. EXAM: DG HIP (WITH OR WITHOUT PELVIS) 2-3V RIGHT COMPARISON:  None. FINDINGS: There is no evidence of hip fracture or dislocation. There is no evidence of arthropathy or other focal bone abnormality. IMPRESSION: Negative. Electronically Signed   By: Marlaine Hind M.D.   On: 10/20/2019 22:07        Scheduled Meds:  furosemide  20 mg Intravenous BID    HYDROmorphone (DILAUDID) injection  1 mg Intravenous Once   insulin aspart  0-5 Units Subcutaneous QHS   insulin aspart  0-9 Units Subcutaneous TID WC   insulin glargine  8 Units Subcutaneous Daily   metoprolol tartrate  25 mg Oral BID   Continuous Infusions:  heparin 1,650 Units/hr (10/22/19 1043)   magnesium sulfate bolus IVPB       LOS: 2 days    Time spent: 35 minutes    Irine Seal, MD [facility]  If 7PM-7AM, please contact night-coverage [email or web address] 10/22/2019, 12:19 PM

## 2019-10-22 NOTE — Progress Notes (Signed)
ANTICOAGULATION CONSULT NOTE  Pharmacy Consult for IV heparin Indication: atrial fibrillation  No Known Allergies  Patient Measurements: Height: 5\' 8"  (172.7 cm) Weight: (!) 329 lb 12.9 oz (149.6 kg) IBW/kg (Calculated) : 63.9 Heparin Dosing Weight: 100.6 kg  Vital Signs: Temp: 98 F (36.7 C) (11/02 1051) Temp Source: Oral (11/02 1051) BP: 157/108 (11/02 1051) Pulse Rate: 94 (11/02 1051)  Labs: Recent Labs    10/20/19 1958 10/20/19 2028 10/20/19 2245 10/20/19 2304 10/21/19 0703 10/21/19 0710  10/22/19 0014 10/22/19 0925 10/22/19 1716  HGB 11.9*  --   --   --  10.4*  --   --  10.5*  --   --   HCT 39.7  --   --   --  35.1*  --   --  34.9*  --   --   PLT PLATELET CLUMPS NOTED ON SMEAR, UNABLE TO ESTIMATE  --   --   --  173  --   --  171  --   --   APTT  --   --   --  >200*  --  132*  --   --   --   --   LABPROT  --   --   --  18.2*  --  16.9*  --   --   --   --   INR  --   --   --  1.5*  --  1.4*  --   --   --   --   HEPARINUNFRC  --   --   --   --  0.55  --    < > 0.10* 0.27* 0.31  CREATININE 1.76*  --   --   --  1.90*  --   --   --  1.98* 2.08*  TROPONINIHS  --  103* 83*  --   --   --   --   --   --   --    < > = values in this interval not displayed.    Estimated Creatinine Clearance: 49.6 mL/min (A) (by C-G formula based on SCr of 2.08 mg/dL (H)).   Assessment: 32 yoF with PMH DM2, HTN, presents with R hip pain after falling. Also reports several months of LE edema, orthopnea, and abdominal distention. Found to have elevated BNP and to also be in AFib with RVR. Pharmacy to dose heparin   Baseline INR, aPTT: elevated; charted as being drawn before heparin started, but RN notes report drawing aPTT/INR while heparin running  Prior anticoagulation: none  Today, 11/2  heparin level up to 0.31 after heparin rate increase to 1650 units/hr -  At low end of goal CBC stable, no bleeding reported   Goal of Therapy: Heparin level 0.3-0.7 units/ml Monitor platelets  by anticoagulation protocol: Yes  Plan:  increase heparin drip to 1750 units/hr  Daily CBC, daily heparin level   Monitor for signs of bleeding or thrombosis  Issues with coverage after moving to [place]; recommend CM consult to assist in determining preferred oral anticoagulant  To continue heparin drip for now as she will likely need cardiac cath and TEE/DCCV   Eudelia Bunch, Pharm.D [phone] 10/22/2019 5:49 PM

## 2019-10-22 NOTE — Progress Notes (Signed)
ANTICOAGULATION CONSULT NOTE  Pharmacy Consult for IV heparin Indication: atrial fibrillation  No Known Allergies  Patient Measurements: Height: 5\' 8"  (172.7 cm) Weight: (!) 329 lb 12.9 oz (149.6 kg) IBW/kg (Calculated) : 63.9 Heparin Dosing Weight: 100.6 kg  Vital Signs: Temp: 97.8 F (36.6 C) (11/02 0652) Temp Source: Oral (11/02 0652) BP: 139/104 (11/02 0835) Pulse Rate: 103 (11/02 0843)  Labs: Recent Labs    10/20/19 1958 10/20/19 2028 10/20/19 2245 10/20/19 2304  10/21/19 0703 10/21/19 0710 10/21/19 1555 10/22/19 0014 10/22/19 0925  HGB 11.9*  --   --   --   --  10.4*  --   --  10.5*  --   HCT 39.7  --   --   --   --  35.1*  --   --  34.9*  --   PLT PLATELET CLUMPS NOTED ON SMEAR, UNABLE TO ESTIMATE  --   --   --   --  173  --   --  171  --   APTT  --   --   --  >200*  --   --  132*  --   --   --   LABPROT  --   --   --  18.2*  --   --  16.9*  --   --   --   INR  --   --   --  1.5*  --   --  1.4*  --   --   --   HEPARINUNFRC  --   --   --   --    < > 0.55  --  0.13* 0.10* 0.27*  CREATININE 1.76*  --   --   --   --  1.90*  --   --   --  1.98*  TROPONINIHS  --  103* 83*  --   --   --   --   --   --   --    < > = values in this interval not displayed.    Estimated Creatinine Clearance: 52.1 mL/min (A) (by C-G formula based on SCr of 1.98 mg/dL (H)).   Assessment: 71 yoF with PMH DM2, HTN, presents with R hip pain after falling. Also reports several months of LE edema, orthopnea, and abdominal distention. Found to have elevated BNP and to also be in AFib with RVR. Pharmacy to dose heparin   Baseline INR, aPTT: elevated; charted as being drawn before heparin started, but RN notes report drawing aPTT/INR while heparin running  Prior anticoagulation: none  Today, 11/2  heparin level up to 0.27 after 2000 unit bolus and heparin rate increase to 1450 units/hr - slightly below goal CBC stable, no bleeding reported   Goal of Therapy: Heparin level 0.3-0.7  units/ml Monitor platelets by anticoagulation protocol: Yes  Plan:  increase heparin drip to 1650 units/hr  Check HL in 6 hours  Daily CBC, daily heparin level once stable  Monitor for signs of bleeding or thrombosis  Issues with coverage after moving to [place]; recommend CM consult to assist in determining preferred oral anticoagulant   Eudelia Bunch, Pharm.D [phone] 10/22/2019 10:15 AM

## 2019-10-22 NOTE — Evaluation (Signed)
Physical Therapy Evaluation Patient Details Name: Adrienne Lane MRN: [id number] DOB: February 23, 1966 Today's Date: 10/22/2019   History of Present Illness  53 yo female admitted to ED on 10/29 with fall out of minivan and R hip and LE pain. Xray negative for fracture. Pt found to be in afib with RVR, sHF (new diagnosis). PMH includes DM with A1C 11.7, HTN.  Clinical Impression   Pt presents with difficulty performing transfers, dyspnea on exertion with tachycardia, and decreased activity tolerance. Pt to benefit from acute PT to address deficits. Pt ambulated 2x40 ft with no AD but intermittent UE support on hallway handrails. Pt limited by fatigue, DOE 3/4. PT expects pt to progress well with PT, d/c home in the care and assist of her family. PT to progress mobility as tolerated, and will continue to follow acutely.      Follow Up Recommendations Supervision for mobility/OOB;No PT follow up    Equipment Recommendations  None recommended by PT    Recommendations for Other Services       Precautions / Restrictions Precautions Precautions: Fall Precaution Comments: moderate fall risk Restrictions Weight Bearing Restrictions: No      Mobility  Bed Mobility Overal bed mobility: Needs Assistance             General bed mobility comments: pt sitting EOB upon PT arrival, up to chair upon PT exit.  Transfers Overall transfer level: Needs assistance Equipment used: 1 person hand held assist Transfers: Sit to/from Stand Sit to Stand: Min assist;From elevated surface         General transfer comment: Min assist for power up and steadying, pt with x3 unsuccessful stand attempts from low bed.  Ambulation/Gait Ambulation/Gait assistance: Min guard Gait Distance (Feet): 40 Feet(2x40 ft, 1 minute standing rest break) Assistive device: None;IV Pole Gait Pattern/deviations: Step-through pattern;Decreased stride length;Wide base of support Gait velocity: decr   General Gait  Details: Min guard for safety, pt with intermittent use of handrails in hallway and IV pole for steadying. 1 standing rest break to recover dyspnea on exertion, SpO2 99% with HR 113-131 bpm.  Stairs            Wheelchair Mobility    Modified Rankin (Stroke Patients Only)       Balance Overall balance assessment: Mild deficits observed, not formally tested;History of Falls                                           Pertinent Vitals/Pain Pain Assessment: Faces Faces Pain Scale: Hurts a little bit Pain Location: R hip and leg Pain Descriptors / Indicators: Sore Pain Intervention(s): Limited activity within patient's tolerance;Monitored during session;Repositioned    Home Living Family/patient expects to be discharged to:: Private residence Living Arrangements: Spouse/significant other Available Help at Discharge: Family Type of Home: House Home Access: Stairs to enter Entrance Stairs-Rails: Right;Can reach Software engineer of Steps: 5 Home Layout: One level Home Equipment: None      Prior Function Level of Independence: Independent         Comments: Pt reports history of difficulty with transfers and occasionally requires assist from family. Otherwise, pt reports independence with mobility PTA, no other history of falling besides fall on Thursday.     Hand Dominance   Dominant Hand: Right    Extremity/Trunk Assessment   Upper Extremity Assessment Upper Extremity Assessment: Overall WFL for tasks  assessed    Lower Extremity Assessment Lower Extremity Assessment: Overall WFL for tasks assessed    Cervical / Trunk Assessment Cervical / Trunk Assessment: Normal  Communication   Communication: No difficulties  Cognition Arousal/Alertness: Awake/alert Behavior During Therapy: WFL for tasks assessed/performed Overall Cognitive Status: Within Functional Limits for tasks assessed                                         General Comments      Exercises     Assessment/Plan    PT Assessment Patient needs continued PT services  PT Problem List Decreased strength;Decreased mobility;Decreased activity tolerance;Decreased balance;Decreased knowledge of use of DME;Pain;Cardiopulmonary status limiting activity       PT Treatment Interventions DME instruction;Therapeutic activities;Gait training;Therapeutic exercise;Patient/family education;Stair training;Balance training;Functional mobility training    PT Goals (Current goals can be found in the Care Plan section)  Acute Rehab PT Goals Patient Stated Goal: return home with family PT Goal Formulation: With patient Time For Goal Achievement: 11/05/19 Potential to Achieve Goals: Good    Frequency Min 3X/week   Barriers to discharge        Co-evaluation               AM-PAC PT "6 Clicks" Mobility  Outcome Measure Help needed turning from your back to your side while in a flat bed without using bedrails?: A Little Help needed moving from lying on your back to sitting on the side of a flat bed without using bedrails?: A Little Help needed moving to and from a bed to a chair (including a wheelchair)?: A Little Help needed standing up from a chair using your arms (e.g., wheelchair or bedside chair)?: A Little Help needed to walk in hospital room?: A Little Help needed climbing 3-5 steps with a railing? : A Little 6 Click Score: 18    End of Session Equipment Utilized During Treatment: Gait belt Activity Tolerance: Patient tolerated treatment well;Patient limited by pain Patient left: in chair;with call bell/phone within reach Nurse Communication: Mobility status PT Visit Diagnosis: Other abnormalities of gait and mobility (R26.89);History of falling (Z91.81);Unsteadiness on feet (R26.81)    Time: 1583-0940 PT Time Calculation (min) (ACUTE ONLY): 26 min   Charges:   PT Evaluation $PT Eval Low Complexity: 1 Low PT  Treatments $Gait Training: 8-22 mins       Alleigh Mollica E, PT Acute Rehabilitation Services Pager [phone]  Office [phone]  Eura Mccauslin D Naiah Donahoe 10/22/2019, 1:35 PM

## 2019-10-22 NOTE — Progress Notes (Addendum)
Inpatient Diabetes Program Recommendations  AACE/ADA: New Consensus Statement on Inpatient Glycemic Control (2015)  Target Ranges:  Prepandial:   less than 140 mg/dL      Peak postprandial:   less than 180 mg/dL (1-2 hours)      Critically ill patients:  140 - 180 mg/dL   Results for Adrienne Lane, Adrienne Lane (MRN [id number]) as of 10/22/2019 08:34  Ref. Range 10/20/2019 20:36 10/20/2019 23:24 10/21/2019 01:57 10/21/2019 08:22 10/21/2019 11:52 10/21/2019 16:18 10/21/2019 21:21  Glucose-Capillary Latest Ref Range: 70 - 99 mg/dL 303 (H) 328 (H)  4 units NOVOLOG  379 (H)  9 units NOVOLOG  225 (H)  3 units NOVOLOG  205 (H)  3 units NOVOLOG  197 (H)  2 units NOVOLOG  207 (H)  2 units NOVOLOG    Results for Adrienne, Lane (MRN [id number]) as of 10/22/2019 08:34  Ref. Range 10/22/2019 07:55  Glucose-Capillary Latest Ref Range: 70 - 99 mg/dL 168 (H)   Results for Adrienne, Lane (MRN [id number]) as of 10/22/2019 08:34  Ref. Range 10/20/2019 23:06  Hemoglobin A1C Latest Ref Range: 4.8 - 5.6 % 11.7 (H)  (289 mg/dl)    Admit with: A Fib with RVR/ New Onset CHF  History: DM, HTN  Home DM Meds: None since December 2019       Was taking Basaglar 30 units Daily  Current Orders: Novolog Sensitive Correction Scale/ SSI (0-9 units) TID AC + HS     Off meds and NO medical care for HTN and DM since December 2019 when she moved to [facility] from [place] and lost her Medicaid.  Was taking Insulin prior to moving to [place].  Hopefully Care Management can get patient follow up care with the [facility] after d/c so she can also get her meds filled there as well.    MD- Please consider the following in-hospital insulin adjustments:  1. Add low dose basal insulin to regimen: Lantus 8 units QHS (0.05 units/kg)  2. Increase Novolog SSI to the Moderate scale (0-15 units) TID AC + HS   Addendum 1:30pm- Spoke with patient about her current A1c of 11.7%.  Pt sad to hear her A1c was so  high but told me it doesn't surprise her since she has been off her insulin since December 2019 after losing her Medicaid after moving to [place] from [place].  Has has diabetes for over 20 years.  Explained what an A1c is and what it measures.  Reminded patient that her goal A1c is 7% or less per ADA standards to prevent both acute and long-term complications.  Reviewed home CBG goals.  Explained to patient the extreme importance of good glucose control at home.  Encouraged patient to check her CBGs at least bid at home (fasting and another check within the day) and to record all CBGs in a logbook for her PCP to review.  Pt concerned about affording meds and MD visits.  Discussed with pt that I have placed a consult to the Care Mgmt team and that I am hoping we can get her an appt at the [facility] clinic for affordable follow up.  Also explained to pt that if we can get her an appt at the [facility] clinic that she can also use their pharmacy which helps to provide meds at low cost as well.  Pt appreciative of all the info.    Called the [facility] pharmacy today to find out what Insulin they have in stock for low cost and  the pharmacy rep told me they have Basaglar and Humalog Insuiln pens in stock. Basaglar insulin pen- Order # [id number] Humalog Insulin pen- Order # [id number] Insulin Pen Needles- Order # [id number] Glucometer- Order # [id number]      --Will follow patient during hospitalization--  Wyn Quaker RN, MSN, CDE Diabetes Coordinator Inpatient Glycemic Control Team Team Pager: [phone] (8a-5p)

## 2019-10-22 NOTE — Progress Notes (Signed)
ANTICOAGULATION CONSULT NOTE  Pharmacy Consult for IV heparin Indication: atrial fibrillation  No Known Allergies  Patient Measurements: Height: 5\' 8"  (172.7 cm) Weight: (!) 328 lb 1.6 oz (148.8 kg) IBW/kg (Calculated) : 63.9 Heparin Dosing Weight: 83 kg  Vital Signs: Temp: 98.6 F (37 C) (11/01 2315) Temp Source: Oral (11/01 2315) BP: 116/88 (11/01 2315) Pulse Rate: 93 (11/01 2315)  Labs: Recent Labs    10/20/19 1958 10/20/19 2028 10/20/19 2245 10/20/19 2304 10/21/19 0703 10/21/19 0710 10/21/19 1555 10/22/19 0014  HGB 11.9*  --   --   --  10.4*  --   --  10.5*  HCT 39.7  --   --   --  35.1*  --   --  34.9*  PLT PLATELET CLUMPS NOTED ON SMEAR, UNABLE TO ESTIMATE  --   --   --  173  --   --  171  APTT  --   --   --  >200*  --  132*  --   --   LABPROT  --   --   --  18.2*  --  16.9*  --   --   INR  --   --   --  1.5*  --  1.4*  --   --   HEPARINUNFRC  --   --   --   --  0.55  --  0.13* 0.10*  CREATININE 1.76*  --   --   --  1.90*  --   --   --   TROPONINIHS  --  103* 83*  --   --   --   --   --     Estimated Creatinine Clearance: 54.1 mL/min (A) (by C-G formula based on SCr of 1.9 mg/dL (H)).   Medical History: Past Medical History:  Diagnosis Date  . Diabetes mellitus without complication ([place])   . Hypertension     Medications:  Scheduled:  . furosemide  20 mg Intravenous BID  . influenza vac split quadrivalent PF  0.5 mL Intramuscular Tomorrow-1000  . insulin aspart  0-5 Units Subcutaneous QHS  . insulin aspart  0-9 Units Subcutaneous TID WC  . metoprolol tartrate  25 mg Oral BID   Infusions:  . heparin 1,200 Units/hr (10/21/19 1930)    Assessment: 50 yoF with PMH DM2, HTN, presents with R hip pain after falling. Also reports several months of LE edema, orthopnea, and abdominal distention. Found to have elevated BNP and to also be in AFib with RVR. Pharmacy to dose heparin   Baseline INR, aPTT: elevated; charted as being drawn before heparin  started, but RN notes report drawing aPTT/INR while heparin running  Prior anticoagulation: none  Significant events:  11/1  CBC: Hgb slightly low at baseline and lower again today but still >10g; Plt stable WNL  Most recent heparin level therapeutic on 1200 units/hr Today, 11/2   Don't think last order was entered into Epic by pharmacy so heparin did not get increase to 1450 units/hr as ordered before.   0014 HL = 0.10 below goal, no infuson or bleeding issues per RN. H/H, plts stable     Goal of Therapy: Heparin level 0.3-0.7 units/ml Monitor platelets by anticoagulation protocol: Yes  Plan:  Give 2000 unit rebolus and increase heparin drip to 1450 units/hr  Check HL in 6 hours  Daily CBC, daily heparin level once stable  Monitor for signs of bleeding or thrombosis  Issues with coverage after moving to [place]; recommend CM  consult to assist in determining preferred oral anticoagulant   Dorrene German 10/22/2019, 1:12 AM

## 2019-10-22 NOTE — Progress Notes (Signed)
Cardiology Progress Note  Patient ID: Adrienne Lane MRN: [id number] DOB: [birth year] Date of Encounter: 10/22/2019  Primary Cardiologist: No primary care provider on file.  Subjective  Suboptimal urine output overnight.  Blood pressure remains severely elevated.  Heart rates better controlled on metoprolol.  Surprisingly without major symptoms.  ROS:  All other ROS reviewed and negative. Pertinent positives noted in the HPI.     Inpatient Medications  Scheduled Meds: . furosemide  60 mg Intravenous Once  .  HYDROmorphone (DILAUDID) injection  1 mg Intravenous Once  . insulin aspart  0-5 Units Subcutaneous QHS  . insulin aspart  0-9 Units Subcutaneous TID WC  . insulin glargine  8 Units Subcutaneous Daily  . isosorbide-hydrALAZINE  2 tablet Oral TID  . metoprolol tartrate  50 mg Oral Q6H  . potassium chloride  40 mEq Oral Once  . potassium chloride  40 mEq Oral Once   Continuous Infusions: . furosemide    . heparin 1,650 Units/hr (10/22/19 1043)   PRN Meds: acetaminophen **OR** acetaminophen, traMADol   Vital Signs   Vitals:   10/22/19 0652 10/22/19 0835 10/22/19 0843 10/22/19 1051  BP: (!) 139/103 (!) 139/104  (!) 157/108  Pulse: (!) 105 (!) 105 (!) 103 94  Resp: 16 18  20   Temp: 97.8 F (36.6 C)   98 F (36.7 C)  TempSrc: Oral   Oral  SpO2: 100% 98%  97%  Weight:      Height:        Intake/Output Summary (Last 24 hours) at 10/22/2019 1330 Last data filed at 10/22/2019 1206 Gross per 24 hour  Intake 1324.04 ml  Output 750 ml  Net 574.04 ml   Last 3 Weights 10/22/2019 10/21/2019 10/21/2019  Weight (lbs) 329 lb 12.9 oz 328 lb 1.6 oz 280 lb  Weight (kg) 149.6 kg 148.825 kg 127.007 kg      Telemetry  Overnight telemetry shows intermittent atrial fibrillation with frequent PVCs heart rates in the 90-110 range, which I personally reviewed.   ECG  The most recent ECG shows atrial fibrillation with RVR heart rate 110, nonspecific ST-T changes noted, which I  personally reviewed.   Physical Exam   Vitals:   10/22/19 0652 10/22/19 0835 10/22/19 0843 10/22/19 1051  BP: (!) 139/103 (!) 139/104  (!) 157/108  Pulse: (!) 105 (!) 105 (!) 103 94  Resp: 16 18  20   Temp: 97.8 F (36.6 C)   98 F (36.7 C)  TempSrc: Oral   Oral  SpO2: 100% 98%  97%  Weight:      Height:         Intake/Output Summary (Last 24 hours) at 10/22/2019 1330 Last data filed at 10/22/2019 1206 Gross per 24 hour  Intake 1324.04 ml  Output 750 ml  Net 574.04 ml    Last 3 Weights 10/22/2019 10/21/2019 10/21/2019  Weight (lbs) 329 lb 12.9 oz 328 lb 1.6 oz 280 lb  Weight (kg) 149.6 kg 148.825 kg 127.007 kg    Body mass index is 50.15 kg/m.  General: Well nourished, well developed, in no acute distress Head: Atraumatic, normal size  Eyes: PEERLA, EOMI  Neck: Supple, jugular venous distention up to earlobes, 18 to 20 cm of water Endocrine: No thryomegaly Cardiac: Irregular rhythm, no murmurs rubs or gallops Lungs: Crackles noted at lung bases Abd: Soft, nontender, no hepatomegaly  Ext: 2+ pitting edema to thighs Musculoskeletal: No deformities, BUE and BLE strength normal and equal Skin: Warm and dry, no rashes  Neuro: Alert and oriented to person, place, time, and situation, CNII-XII grossly intact, no focal deficits  Psych: Normal mood and affect   Labs  High Sensitivity Troponin:   Recent Labs  Lab 10/20/19 2028 10/20/19 2245  TROPONINIHS 103* 83*     Cardiac EnzymesNo results for input(s): TROPONINI in the last 168 hours. No results for input(s): TROPIPOC in the last 168 hours.  Chemistry Recent Labs  Lab 10/20/19 1958 10/21/19 0703 10/22/19 0925  NA 140 142 138  K 3.4* 3.6 3.4*  CL 105 108 104  CO2 24 24 23   GLUCOSE 355* 180* 226*  BUN 31* 33* 37*  CREATININE 1.76* 1.90* 1.98*  CALCIUM 9.1 8.9 8.8*  PROT  --   --  6.5  ALBUMIN  --   --  2.8*  AST  --   --  23  ALT  --   --  22  ALKPHOS  --   --  69  BILITOT  --   --  1.2  GFRNONAA 33* 30* 29*   GFRAA 38* 35* 33*  ANIONGAP 11 10 11     Hematology Recent Labs  Lab 10/20/19 1958 10/21/19 0703 10/22/19 0014  WBC 6.2 5.8 6.7  RBC 4.93 4.36 4.32  HGB 11.9* 10.4* 10.5*  HCT 39.7 35.1* 34.9*  MCV 80.5 80.5 80.8  MCH 24.1* 23.9* 24.3*  MCHC 30.0 29.6* 30.1  RDW 18.9* 18.6* 18.6*  PLT PLATELET CLUMPS NOTED ON SMEAR, UNABLE TO ESTIMATE 173 171   BNP Recent Labs  Lab 10/20/19 2029  BNP 1,623.1*    DDimer No results for input(s): DDIMER in the last 168 hours.   Radiology  Dg Chest 1 View  Result Date: 10/20/2019 CLINICAL DATA:  Fall 2 days ago.  Shortness of breath. EXAM: CHEST  1 VIEW COMPARISON:  None. FINDINGS: The heart size and mediastinal contours are within normal limits. Both lungs are clear. No evidence of pneumothorax or pleural effusion. The visualized skeletal structures are unremarkable. IMPRESSION: No active disease. Electronically Signed   By: Marlaine Hind M.D.   On: 10/20/2019 22:07   Dg Hip Unilat With Pelvis 2-3 Views Right  Result Date: 10/20/2019 CLINICAL DATA:  Fall 2 days ago. Right hip pain. Initial encounter. EXAM: DG HIP (WITH OR WITHOUT PELVIS) 2-3V RIGHT COMPARISON:  None. FINDINGS: There is no evidence of hip fracture or dislocation. There is no evidence of arthropathy or other focal bone abnormality. IMPRESSION: Negative. Electronically Signed   By: Marlaine Hind M.D.   On: 10/20/2019 22:07    Cardiac Studies  TTE 10/21/2019  1. Left ventricular ejection fraction, by visual estimation, is 20 to 25%. The left ventricle has severely decreased function. There is mildly increased left ventricular hypertrophy.  2. Left ventricular diastolic parameters are indeterminate.  3. The left ventricle demonstrates global hypokinesis.  4. Global right ventricle has normal systolic function.The right ventricular size is mildly enlarged. No increase in right ventricular wall thickness.  5. Left atrial size was moderately dilated.  6. Right atrial size was  moderately dilated.  7. The pericardial effusion is circumferential.  8. Trivial pericardial effusion is present.  9. The mitral valve is normal in structure. Mild to moderate mitral valve regurgitation. No evidence of mitral stenosis. 10. The tricuspid valve is normal in structure. Tricuspid valve regurgitation moderate-severe. 11. The aortic valve is normal in structure. Aortic valve regurgitation is not visualized. No evidence of aortic valve sclerosis or stenosis. 12. The pulmonic valve was normal in structure.  Pulmonic valve regurgitation is not visualized. 13. Moderately elevated pulmonary artery systolic pressure. 14. The tricuspid regurgitant velocity is 3.29 m/s, and with an assumed right atrial pressure of 15 mmHg, the estimated right ventricular systolic pressure is moderately elevated at 58.3 mmHg. 15. The inferior vena cava is dilated in size with <50% respiratory variability, suggesting right atrial pressure of 15 mmHg.  Patient Profile  Jumana Paccione is a 53 y.o. female with history of diabetes (A1c 11.1), CKD, obesity who was admitted on 11/1 for new onset systolic heart failure as well as atrial fibrillation with RVR.  This is a new diagnosis for her.  Assessment & Plan  1.  New onset systolic heart failure, ejection fraction 20 to 25%, with gross volume overload -Unclear etiology at this point, but longstanding history of diabetes hypertension is suspect -EKG without any acute ischemic changes however she will need an ischemia evaluation prior to discharge -We will increase her Lasix to 120 mg twice daily, and I will give potassium supplementation with each dose of Lasix -We need to check a repeat be met this afternoon and I have ordered this -We will continue metoprolol 50 mg tartrate every 6 hours for better rate control -I have added BiDil 20 mg - 37.5 mg 3 times daily for better afterload reduction as well with diuresis -She will need a cardiac cath likely prior to  discharge and we will do this when she is euvolemic and her kidney function is improved -I also favor TEE cardioversion pending better volume removal  2.  Atrial fibrillation with RVR -TSH 4.6, but T4 is within normal limits -Unclear if this is the cause of her cardiomyopathy or if she developed heart failure first and then A. Fib -We will continue with metoprolol tartrate 50 mg every 6 hours for rate control -I suspect her rates will be better once we get volume off -Continue heparin drip, as she will likely need cardiac cath and a TEE/DCCV -We need to get as much fluid off and get her kidney function as best as possible before any of the above plan procedures   For questions or updates, please contact [place] Please consult [email or web address] for contact info under   Signed, [place] T. Audie Box, [place]  10/22/2019 1:30 PM

## 2019-10-22 NOTE — Progress Notes (Signed)
Pt reports itching around abdomen since yesterday. She states it could possibly be due to decreased fluid swelling and drying around the area. On call provider made aware. One time dose of Benadryl ordered and administered. Pt reports effective. Will continue to monitor.

## 2019-10-23 ENCOUNTER — Telehealth: Payer: Self-pay

## 2019-10-23 ENCOUNTER — Inpatient Hospital Stay (HOSPITAL_COMMUNITY): Payer: Self-pay

## 2019-10-23 DIAGNOSIS — N200 Calculus of kidney: Secondary | ICD-10-CM

## 2019-10-23 LAB — URINALYSIS, ROUTINE W REFLEX MICROSCOPIC
Bilirubin Urine: NEGATIVE
Glucose, UA: NEGATIVE mg/dL
Ketones, ur: NEGATIVE mg/dL
Nitrite: NEGATIVE
Protein, ur: 100 mg/dL — AB
RBC / HPF: >50 RBC/hpf — ABNORMAL HIGH (ref 0–5)
Specific Gravity, Urine: 1.006 (ref 1.005–1.030)
WBC, UA: >50 WBC/hpf — ABNORMAL HIGH (ref 0–5)
pH: 7 (ref 5.0–8.0)

## 2019-10-23 LAB — BASIC METABOLIC PANEL
Anion gap: 11 (ref 5–15)
BUN: 42 mg/dL — ABNORMAL HIGH (ref 6–20)
CO2: 20 mmol/L — ABNORMAL LOW (ref 22–32)
Calcium: 8.6 mg/dL — ABNORMAL LOW (ref 8.9–10.3)
Chloride: 106 mmol/L (ref 98–111)
Creatinine, Ser: 2.24 mg/dL — ABNORMAL HIGH (ref 0.44–1.00)
GFR calc Af Amer: 28 mL/min — ABNORMAL LOW (ref >60)
GFR calc non Af Amer: 25 mL/min — ABNORMAL LOW (ref >60)
Glucose, Bld: 221 mg/dL — ABNORMAL HIGH (ref 70–99)
Potassium: 4.6 mmol/L (ref 3.5–5.1)
Sodium: 137 mmol/L (ref 135–145)

## 2019-10-23 LAB — GLUCOSE, CAPILLARY
Glucose-Capillary: 215 mg/dL — ABNORMAL HIGH (ref 70–99)
Glucose-Capillary: 275 mg/dL — ABNORMAL HIGH (ref 70–99)
Glucose-Capillary: 222 mg/dL — ABNORMAL HIGH (ref 70–99)
Glucose-Capillary: 247 mg/dL — ABNORMAL HIGH (ref 70–99)

## 2019-10-23 LAB — CBC
HCT: 34.4 % — ABNORMAL LOW (ref 36.0–46.0)
Hemoglobin: 10.5 g/dL — ABNORMAL LOW (ref 12.0–15.0)
MCH: 24.3 pg — ABNORMAL LOW (ref 26.0–34.0)
MCHC: 30.5 g/dL (ref 30.0–36.0)
MCV: 79.6 fL — ABNORMAL LOW (ref 80.0–100.0)
Platelets: 197 10*3/uL (ref 150–400)
RBC: 4.32 MIL/uL (ref 3.87–5.11)
RDW: 18.8 % — ABNORMAL HIGH (ref 11.5–15.5)
WBC: 6.1 10*3/uL (ref 4.0–10.5)
nRBC: 0 % (ref 0.0–0.2)

## 2019-10-23 LAB — HEPARIN LEVEL (UNFRACTIONATED): Heparin Unfractionated: 0.42 IU/mL (ref 0.30–0.70)

## 2019-10-23 LAB — MAGNESIUM: Magnesium: 2 mg/dL (ref 1.7–2.4)

## 2019-10-23 MED ORDER — FUROSEMIDE 10 MG/ML IJ SOLN
120.0000 mg | Freq: Once | INTRAVENOUS | Status: DC
  Filled 2019-10-23: qty 12

## 2019-10-23 MED ORDER — INSULIN GLARGINE 100 UNIT/ML ~~LOC~~ SOLN
12.0000 [IU] | Freq: Every day | SUBCUTANEOUS | Status: DC
  Administered 2019-10-23 – 2019-11-12 (×21): 12 [IU] via SUBCUTANEOUS
  Filled 2019-10-23 (×21): qty 0.12

## 2019-10-23 MED ORDER — FUROSEMIDE 10 MG/ML IJ SOLN
120.0000 mg | Freq: Two times a day (BID) | INTRAVENOUS | Status: DC
  Administered 2019-10-23 (×2): 120 mg via INTRAVENOUS
  Filled 2019-10-23 (×3): qty 12

## 2019-10-23 NOTE — Progress Notes (Signed)
Pharmacist Heart Failure Core Measure Documentation  Assessment: Adrienne Lane has an EF documented as 20-25% on 10/21/2019 by TTE.  Rationale: Heart failure patients with left ventricular systolic dysfunction (LVSD) and an EF < 40% should be prescribed an angiotensin converting enzyme inhibitor (ACEI) or angiotensin receptor blocker (ARB) at discharge unless a contraindication is documented in the medical record.  This patient is not currently on an ACEI or ARB for HF.  This note is being placed in the record in order to provide documentation that a contraindication to the use of these agents is present for this encounter.  ACE Inhibitor or Angiotensin Receptor Blocker is contraindicated (specify all that apply)  []   ACEI allergy AND ARB allergy []   Angioedema []   Moderate or severe aortic stenosis []   Hyperkalemia []   Hypotension []   Renal artery stenosis [x]   Worsening renal function, preexisting renal disease or dysfunction   Eudelia Bunch, Pharm.D [phone] 10/23/2019 7:58 AM

## 2019-10-23 NOTE — Progress Notes (Deleted)
Got a call from central telemetry regarding patient's heart rate on 45, Rn went to checked patient sleeping and was woken up for further assessment, patient deny any symptoms and stated that " I must have been very relax" , heart rate went back from 60's- 70's . We will continue to monitor.

## 2019-10-23 NOTE — Progress Notes (Signed)
Inpatient Diabetes Program Recommendations  AACE/ADA: New Consensus Statement on Inpatient Glycemic Control (2015)  Target Ranges:  Prepandial:   less than 140 mg/dL      Peak postprandial:   less than 180 mg/dL (1-2 hours)      Critically ill patients:  140 - 180 mg/dL   Results for Adrienne Lane, Adrienne Lane (MRN [id number]) as of 10/23/2019 13:03  Ref. Range 10/22/2019 07:55 10/22/2019 12:01 10/22/2019 16:38 10/22/2019 20:56  Glucose-Capillary Latest Ref Range: 70 - 99 mg/dL 168 (H)  2 units NOVOLOG  204 (H)  3 units NOVOLOG +  8 units LANTUS  218 (H)  3 units NOVOLOG  259 (H)  3 units NOVOLOG    Results for Adrienne Lane, Adrienne Lane (MRN [id number]) as of 10/23/2019 13:03  Ref. Range 10/23/2019 07:31 10/23/2019 11:17  Glucose-Capillary Latest Ref Range: 70 - 99 mg/dL 215 (H)  3 units NOVOLOG  275 (H)  5 units NOVOLOG +  12 units LANTUS      Admit with: A Fib with RVR/ New Onset CHF  History: DM, HTN  Home DM Meds: None since December 2019                             Was taking Basaglar 30 units Daily  Current Orders: Novolog Sensitive Correction Scale/ SSI (0-9 units) TID AC + HS      Lantus 12 units Daily     Off meds and NO medical care for HTN and DM since December 2019 when she moved to [facility] from [place] and lost her Medicaid.  Was taking Insulin prior to moving to [place].  MD- Note Lantus increased to 12 units daily this AM.  Agree with upward adjustment.  Please also consider adding Novolog Meal Coverage:  Novolog 4 units TID with meals  (Please add the following Hold Parameters: Hold if pt eats <50% of meal, Hold if pt NPO)    I called the [facility] pharmacy yesteday to find out what Insulin they have in stock for low cost and the pharmacy rep told me they have Basaglar and Humalog Insuiln pens in stock.  Patient has an appt at the [facility] clinic on 11/06/2019 and will be able to get her Rxs filled over at the [facility] after d/c for low cost.  Please consider the  below insulins as these are what they have in stock:  Basaglar insulin pen- Order # [id number] Humalog Insulin pen- Order # [id number] Insulin Pen Needles- Order # [id number] Glucometer- Order # [id number]     --Will follow patient during hospitalization--  Wyn Quaker RN, MSN, CDE Diabetes Coordinator Inpatient Glycemic Control Team Team Pager: [phone] (8a-5p)

## 2019-10-23 NOTE — Progress Notes (Signed)
Patient's heart rate runs on low 40's this morning Rn checked it manually and went to 50's  But on the monitor patient's heart rate is between 80-90 bpm consulted CN on the floor and advised to notify provider to hold medicine . Notified on call provider to hold scheduled  Metoprolol 50 mg for 0600 am. We will continue to monitor.

## 2019-10-23 NOTE — Progress Notes (Addendum)
PROGRESS NOTE    Adrienne Lane  CZY:[id number] DOB: 09-11-1966 DOA: 10/20/2019 PCP: Patient, No Pcp Per   Brief Narrative:  HPI per Dr Yevonne Aline is a 53 y.o. female with medical history significant of type 2 diabetes and hypertension presented with a chief complaint of right hip pain.  Patient stated 2 days ago she was trying to get out of her [employer] and her leg got stuck.  She lost her balance and fell on her right hip.  Since then she has been having pain in this area when standing or walking.  No pain at rest.  Denied head injury from the fall.  Denied any headaches or neck pain.  Denied loss of consciousness.  Stated she moved from [place] to [place] in December 2019 and could no longer qualify for Medicaid here.  Since then, she has not seen a doctor and has not been on any medications for her diabetes and hypertension.  She was previously on insulin for her diabetes.  Stated she was told about 2.5 to 3 years ago that her heart rate was irregular.  She was referred to cardiology back in [place] about 1.5 years ago but it was just an initial visit and she was not started on any medications.  States her legs have been swollen and abdomen distended for the past 4 months.  Her abdomen feels sore all over.  No nausea, vomiting, or diarrhea.  She is tolerating p.o. intake and requesting food to eat.  Also endorsing orthopnea.  Denies chest pain.  No other complaints.  ED Course: Found to be in A. fib with RVR with rate up to 140s.  Blood pressure elevated.  No leukocytosis.  Hemoglobin 11.9, MCV 80.5.  Potassium 3.4.  Magnesium normal.  Blood glucose 355.  Bicarb 24, anion gap 11.  BUN 31, creatinine 1.7.  TSH borderline elevated at 4.672.  High-sensitivity troponin 103.  BNP 1623.  Chest x-ray without pulmonary edema.  SARS-CoV-2 test pending.  X-ray of right hip and pelvis negative for fracture or dislocation. Patient received IV Lasix 20 mg and IV Lopressor 5 mg.  Assessment  & Plan:   Principal Problem:   Atrial fibrillation with rapid ventricular response (HCC) Active Problems:   CHF (congestive heart failure) (HCC)   Elevated troponin   AKI (acute kidney injury) ([place])   Hyperglycemia   Atrial fibrillation with RVR (HCC)   Right hip pain   Type 2 diabetes mellitus without complication (HCC)   Essential hypertension  1 A. fib with RVR Questionable etiology.  Patient seems to be going in and out of A. fib.  TSH of 4.6.  T4 within normal limits.  2D echo which was done with a EF of 20 to 25% with wall motion abnormalities.  Patient currently on metoprolol 50 mg every 6 hours for rate control.  Patient on IV heparin for anticoagulation.  Per cardiology continue IV heparin as patient may need a cardiac cath and a TEE/DCCV after patient has been diuresed adequately with improvement in renal function.  Appreciate cardiology input and recommendations.  2.  New onset systolic heart failure Questionable etiology.  Patient denies any ongoing chest pain.  Patient had presented with orthopnea, lower extremity edema, abdominal distention.  Patient volume overloaded on examination.  2D echo with a EF of 20 to 25% with mild LVH, left ventricular global hypokinesis.  Patient was on Lasix 20 mg IV every 12 hours with inadequate urine output.  Urine  output of 2.3 L over the past 24 hours. Lasix increased to 120 mg IV twice daily per cardiology recommendations.  Continue metoprolol 50 mg every 6 hours for rate control for A. fib.  Continue BiDil.  Patient will likely need ischemic work-up per cardiology however due to kidney function and current volume overload unable to proceed at this time per cardiology.  Continue diuresis.  Appreciate cardiology input and recommendations.  3.  Elevated troponin Questionable etiology.  Patient with new onset systolic heart failure.  2D echo with a EF of 20 to 25% with wall motion abnormalities.  Patient on a beta-blocker.  Patient on IV Lasix.   BiDil been added to current regimen.  Patient likely needs an ischemic work-up and will defer to cardiology.  4.  Acute renal failure Likely prerenal azotemia secondary to acute CHF exacerbation. Baseline creatinine was 0.9-1.0 in 2019.  Creatinine on admission at 1.7.  Creatinine currently at 2.24 from 1.98.  Lasix dose increased will likely need increased diuresis with Lasix.  Lasix dose increased to 120 mg IV every 12 hours per cardiology.  Monitor urine output.  Strict I's and O's.  Daily weights.  Renal ultrasound negative for hydronephrosis however does show bilateral staghorn calculi.  Monitor urine output.  If worsening renal function may need to get nephrology involved.  Follow.   5.  Hypokalemia Secondary to diuresis.  Repleted.  6.  Poorly controlled diabetes mellitus type 2 Hemoglobin A1c 11.7.  Patient has been out of medications for almost 11 months as patient had moved from [place] down to [place] and [employer] and no recent follow-up with PCP.  CBG of 215 this morning.  Increase Lantus to 12 units daily.  Continue sliding scale insulin.  Follow.   7.  Right hip pain secondary to mechanical fall Plain films of the right hip negative for any fracture dislocation.  PT/OT.  Fall precautions.  Pain management.  8.  Normocytic anemia Anemia panel consistent with anemia of anemia of chronic disease.  Iron level of 44.  Ferritin of 20.  Hemoglobin of 10.5.  Follow H&H.   9.  Abnormal urinalysis/staghorn calculi. Urinalysis abnormal with large leukocytes, nitrite negative, greater than 50 WBCs.  Urine cultures pending.  Patient denies any dysuria.  Renal ultrasound however shows bilateral renal cortical atrophy without obvious renal mass.  Suspected staghorn calculi within both kidneys.  Abdominal films showing large bilateral staghorn renal calculi.  Discussed with urology who came and assessed the patient.  Urology recommending CT stone protocol, awaiting urine culture  results and no need for antibiotics at this time.  If patient was to get septic or decompensate then could likely place empirically on IV antibiotics and further management per urology.    DVT prophylaxis: Heparin. Code Status: Full Family Communication: updated patient.  No family at bedside. Disposition Plan: Likely home when okay with cardiology.   Consultants:   Cardiology: Dr. Gardiner Rhyme 10/21/2019  Urology  Procedures:   2D echo 10/21/2019  Plain films of the right hip and pelvis 10/20/2019  CXR 10/20/2019  Renal ultrasound 10/22/2019  Antimicrobials:  None   Subjective: Patient sitting up in bed.  Still with complaints of shortness of breath on minimal exertion.  Denies any chest pain.  Denies any abdominal pain.  No dysuria.  Going in and out of A. fib.  Metoprolol held overnight.  Objective: Vitals:   10/22/19 1051 10/22/19 2101 10/22/19 2339 10/23/19 0607  BP: (!) 157/108 130/85 (!) 142/83 133/72  Pulse: 94 (!) 43 (!) 49 (!) 42  Resp: 20 20  20   Temp: 98 F (36.7 C) 97.6 F (36.4 C)  97.8 F (36.6 C)  TempSrc: Oral Oral  Oral  SpO2: 97% 100% 98% 100%  Weight:      Height:        Intake/Output Summary (Last 24 hours) at 10/23/2019 1231 Last data filed at 10/23/2019 1200 Gross per 24 hour  Intake 1348.19 ml  Output 2251 ml  Net -902.81 ml   Filed Weights   10/21/19 0046 10/21/19 1503 10/22/19 0500  Weight: 127 kg (!) 148.8 kg (!) 149.6 kg    Examination:  General exam: NAD. Respiratory system: Bibasilar crackles.  No wheezing, no rhonchi.  Speaking in full sentences.  Cardiovascular system: Regular rate rhythm no murmurs rubs or gallops.  2-3+ bilateral lower extremity edema up to thighs.  Gastrointestinal system: Abdomen is soft, nontender, nondistended, positive bowel sounds.  No rebound.  No guarding.  Central nervous system: Alert and oriented. No focal neurological deficits. Extremities: Symmetric 5 x 5 power. Skin: No rashes, lesions or  ulcers Psychiatry: Judgement and insight appear normal. Mood & affect appropriate.     Data Reviewed: I have personally reviewed following labs and imaging studies  CBC: Recent Labs  Lab 10/20/19 1958 10/21/19 0703 10/22/19 0014 10/23/19 0424  WBC 6.2 5.8 6.7 6.1  NEUTROABS 4.3  --   --   --   HGB 11.9* 10.4* 10.5* 10.5*  HCT 39.7 35.1* 34.9* 34.4*  MCV 80.5 80.5 80.8 79.6*  PLT PLATELET CLUMPS NOTED ON SMEAR, UNABLE TO ESTIMATE 173 171 673   Basic Metabolic Panel: Recent Labs  Lab 10/20/19 1958 10/20/19 2028 10/21/19 0703 10/22/19 0925 10/22/19 1716 10/23/19 0424  NA 140  --  142 138 138 137  K 3.4*  --  3.6 3.4* 4.3 4.6  CL 105  --  108 104 106 106  CO2 24  --  24 23 23  20*  GLUCOSE 355*  --  180* 226* 251* 221*  BUN 31*  --  33* 37* 36* 42*  CREATININE 1.76*  --  1.90* 1.98* 2.08* 2.24*  CALCIUM 9.1  --  8.9 8.8* 8.9 8.6*  MG  --  2.1  --  1.8  --  2.0   GFR: Estimated Creatinine Clearance: 46.1 mL/min (A) (by C-G formula based on SCr of 2.24 mg/dL (H)). Liver Function Tests: Recent Labs  Lab 10/22/19 0925  AST 23  ALT 22  ALKPHOS 69  BILITOT 1.2  PROT 6.5  ALBUMIN 2.8*   No results for input(s): LIPASE, AMYLASE in the last 168 hours. No results for input(s): AMMONIA in the last 168 hours. Coagulation Profile: Recent Labs  Lab 10/20/19 2304 10/21/19 0710  INR 1.5* 1.4*   Cardiac Enzymes: No results for input(s): CKTOTAL, CKMB, CKMBINDEX, TROPONINI in the last 168 hours. BNP (last 3 results) No results for input(s): PROBNP in the last 8760 hours. HbA1C: Recent Labs    10/20/19 2306  HGBA1C 11.7*   CBG: Recent Labs  Lab 10/22/19 1201 10/22/19 1638 10/22/19 2056 10/23/19 0731 10/23/19 1117  GLUCAP 204* 218* 259* 215* 275*   Lipid Profile: No results for input(s): CHOL, HDL, LDLCALC, TRIG, CHOLHDL, LDLDIRECT in the last 72 hours. Thyroid Function Tests: Recent Labs    10/20/19 2028 10/21/19 0703  TSH 4.672*  --   FREET4  --   1.24*   Anemia Panel: Recent Labs    10/21/19 0703  FERRITIN  20  TIBC 355  IRON 44   Sepsis Labs: No results for input(s): PROCALCITON, LATICACIDVEN in the last 168 hours.  Recent Results (from the past 240 hour(s))  SARS CORONAVIRUS 2 (TAT 6-24 HRS) Nasopharyngeal Nasopharyngeal Swab     Status: None   Collection Time: 10/20/19 10:45 PM   Specimen: Nasopharyngeal Swab  Result Value Ref Range Status   SARS Coronavirus 2 NEGATIVE NEGATIVE Final    Comment: (NOTE) SARS-CoV-2 target nucleic acids are NOT DETECTED. The SARS-CoV-2 RNA is generally detectable in upper and lower respiratory specimens during the acute phase of infection. Negative results do not preclude SARS-CoV-2 infection, do not rule out co-infections with other pathogens, and should not be used as the sole basis for treatment or other patient management decisions. Negative results must be combined with clinical observations, patient history, and epidemiological information. The expected result is Negative. Fact Sheet for Patients: SugarRoll.be Fact Sheet for Healthcare Providers: https://www.woods-mathews.com/ This test is not yet approved or cleared by the [place] FDA and  has been authorized for detection and/or diagnosis of SARS-CoV-2 by FDA under an Emergency Use Authorization (EUA). This EUA will remain  in effect (meaning this test can be used) for the duration of the COVID-19 declaration under Section 56 4(b)(1) of the Act, 21 U.S.C. section 360bbb-3(b)(1), unless the authorization is terminated or revoked sooner. Performed at [facility], [place] [address], [place], [place] [address]          Radiology Studies: US Renal  Result Date: 10/23/2019 CLINICAL DATA:  Acute kidney injury, history type II diabetes mellitus, hypertension EXAM: RENAL / URINARY TRACT ULTRASOUND COMPLETE COMPARISON:  None FINDINGS: Right Kidney: Renal measurements: 12.6 x  6.2 x 5.8 cm = volume: 240 mL. Cortical thinning. Normal cortical echogenicity. Multiple large shadowing echogenic foci within kidney consistent with large calculi, suspect staghorn calculi. No gross hydronephrosis or renal mass. Left Kidney: Renal measurements: 12.1 x 6.7 x 6.9 cm = volume: 290 mL. Cortical thinning. Normal cortical echogenicity. Large echogenic foci within LEFT kidney likely representing large staghorn calculi. No obvious mass or hydronephrosis. Bladder: Appears normal for degree of bladder distention. Other: N/A IMPRESSION: BILATERAL renal cortical atrophy without obvious renal mass. Suspected staghorn calculi within both kidneys; this can be confirmed by abdominal radiograph. Electronically Signed   By: Lavonia Dana M.D.   On: 10/23/2019 08:17        Scheduled Meds: .  HYDROmorphone (DILAUDID) injection  1 mg Intravenous Once  . insulin aspart  0-5 Units Subcutaneous QHS  . insulin aspart  0-9 Units Subcutaneous TID WC  . insulin glargine  12 Units Subcutaneous Daily  . isosorbide-hydrALAZINE  2 tablet Oral TID  . metoprolol tartrate  50 mg Oral Q6H   Continuous Infusions: . furosemide Stopped (10/23/19 0947)  . heparin 1,750 Units/hr (10/23/19 0215)     LOS: 3 days    Time spent: 35 minutes    Irine Seal, MD [facility]  If 7PM-7AM, please contact night-coverage [email or web address] 10/23/2019, 12:31 PM

## 2019-10-23 NOTE — Progress Notes (Signed)
ANTICOAGULATION CONSULT NOTE  Pharmacy Consult for IV heparin Indication: atrial fibrillation  No Known Allergies  Patient Measurements: Height: 5\' 8"  (172.7 cm) Weight: (!) 329 lb 12.9 oz (149.6 kg) IBW/kg (Calculated) : 63.9 Heparin Dosing Weight: 100.6 kg  Vital Signs: Temp: 97.8 F (36.6 C) (11/03 0607) Temp Source: Oral (11/03 0607) BP: 133/72 (11/03 0607) Pulse Rate: 42 (11/03 0607)  Labs: Recent Labs    10/20/19 2028 10/20/19 2245 10/20/19 2304 10/21/19 0703 10/21/19 0710  10/22/19 0014 10/22/19 0925 10/22/19 1716 10/23/19 0424  HGB  --   --   --  10.4*  --   --  10.5*  --   --  10.5*  HCT  --   --   --  35.1*  --   --  34.9*  --   --  34.4*  PLT  --   --   --  173  --   --  171  --   --  197  APTT  --   --  >200*  --  132*  --   --   --   --   --   LABPROT  --   --  18.2*  --  16.9*  --   --   --   --   --   INR  --   --  1.5*  --  1.4*  --   --   --   --   --   HEPARINUNFRC  --   --   --  0.55  --    < > 0.10* 0.27* 0.31 0.42  CREATININE  --   --   --  1.90*  --   --   --  1.98* 2.08* 2.24*  TROPONINIHS 103* 83*  --   --   --   --   --   --   --   --    < > = values in this interval not displayed.    Estimated Creatinine Clearance: 46.1 mL/min (A) (by C-G formula based on SCr of 2.24 mg/dL (H)).   Assessment: 34 yoF with PMH DM2, HTN, presents with R hip pain after falling. Also reports several months of LE edema, orthopnea, and abdominal distention. Found to have elevated BNP and to also be in AFib with RVR. Pharmacy to dose heparin   Baseline INR, aPTT: elevated; charted as being drawn before heparin started, but RN notes report drawing aPTT/INR while heparin running  Prior anticoagulation: none  10/23/2019  heparin level 0.42 remains therapeutic after heparin rate increase to 1750 units/hr CBC stable, no bleeding reported  Goal of Therapy: Heparin level 0.3-0.7 units/ml Monitor platelets by anticoagulation protocol: Yes  Plan:  Continue  heparin drip at 1750 units/hr  Daily CBC, daily heparin level   Monitor for signs of bleeding or thrombosis  Issues with coverage after moving to [place]; recommend CM consult to assist in determining preferred oral anticoagulant  To continue heparin drip for now as she will likely need cardiac cath and TEE/DCCV   Eudelia Bunch, Pharm.D [phone] 10/23/2019 7:44 AM

## 2019-10-23 NOTE — Consult Note (Signed)
Urology Consult Note   Requesting Attending Physician:  Eugenie Filler, MD Service Providing Consult: Urology  Consulting Attending: Dr. Phebe Colla, MD   Reason for Consult:  Staghorn calculi  HPI: Adrienne Lane is seen in consultation for reasons noted above at the request of Eugenie Filler, MD for evaluation of staghorn calculi.  This is a 53 y.o. female with history of HTN, DM (A1c 11), morbid obesity, new Afib with RVR, new systolic heart failure, new acute kidney injury who presented after hip injury but noted with multiple new diagnosis. Urology was consulted after RUS was obtained in the setting of AKI with report of bilateral staghorn calculi and UA concerning for colonization vs infection.   Patient reports no prior history of stone symptoms or passage. After diagnosed with diabetes was seen by what sounds like a nephrogist (poor historian). Urine testing was "always" concerning for infection and she was "always" on antibiotics "every 2 weeks." However, upon further questioning, she never had dysuria, hematuria, new LUTS. Her baseline mixed urinary incontinence (1 liner per day); did not change after course of antibiotics. But sounds as though post-antibiotic course urine testing never showed resolution and she would undergo another course of antibiotics. When asked, sounds as though urine collection was suboptimal given her body habitus, an not 'clean catch.'  Denies ever having hematuria. Rare UTI with dysuria and urgency/frequency. Feels as though she empties completely.  CT obtained in July 2019 for abdominal lesion did note "large staghorn calculi nearly completely fill the collecting systems bilaterally. Diffuse urothelial thickening throughout both collecting systems. Large uterine mass measuring approximately 15.2 x 18.9 x 18.8 cm." She remember this but was unable to set up urology follow up at that time.   Since admission, she has been started on Lasix. Cr 1.7 on  admission (10/31) -> 2.2 (11/3) while on diuresis today. Electrolytes remain wnl. KUB and RUS with large volume bilateral stone burden. No obvious hydronephrosis. No CVA tenderness or flank pain. Does endorse cutaneous discomfort - although she thinks this is from diuresis.   Denies family history of stones, GI surgery, gout.  Endorses prior excessive use of TUMS, diabetes, morbid obesity.    Past Medical History: Past Medical History:  Diagnosis Date   Diabetes mellitus without complication ([place])    Hypertension     Past Surgical History:  Past Surgical History:  Procedure Laterality Date   CHOLECYSTECTOMY      Medication: Current Facility-Administered Medications  Medication Dose Route Frequency Provider Last Rate Last Dose   acetaminophen (TYLENOL) tablet 650 mg  650 mg Oral Q6H PRN Shela Leff, MD   650 mg at 10/21/19 2138   Or   acetaminophen (TYLENOL) suppository 650 mg  650 mg Rectal Q6H PRN Shela Leff, MD       furosemide (LASIX) 120 mg in dextrose 5 % 50 mL IVPB  120 mg Intravenous BID Geralynn Rile, MD 62 mL/hr at 10/23/19 1708 120 mg at 10/23/19 1708   heparin ADULT infusion 100 units/mL (25000 units/245mL sodium chloride 0.45%)  1,750 Units/hr Intravenous Continuous Eudelia Bunch, RPH 17.5 mL/hr at 10/23/19 1706 1,750 Units/hr at 10/23/19 1706   HYDROmorphone (DILAUDID) injection 1 mg  1 mg Intravenous Once Bodenheimer, Charles A, NP       insulin aspart (novoLOG) injection 0-5 Units  0-5 Units Subcutaneous QHS Shela Leff, MD   3 Units at 10/22/19 2102   insulin aspart (novoLOG) injection 0-9 Units  0-9 Units Subcutaneous TID WC Rathore,  Wandra Feinstein, MD   3 Units at 10/23/19 1647   insulin glargine (LANTUS) injection 12 Units  12 Units Subcutaneous Daily Eugenie Filler, MD   12 Units at 10/23/19 1159   isosorbide-hydrALAZINE (BIDIL) 20-37.5 MG per tablet 2 tablet  2 tablet Oral TID Geralynn Rile, MD   2 tablet at  10/23/19 1647   metoprolol tartrate (LOPRESSOR) tablet 50 mg  50 mg Oral Q6H Geralynn Rile, MD   50 mg at 10/23/19 1647   traMADol (ULTRAM) tablet 50 mg  50 mg Oral Q6H PRN Shela Leff, MD   50 mg at 10/21/19 0039    Allergies: No Known Allergies  Social History: Social History   Tobacco Use   Smoking status: Never Smoker   Smokeless tobacco: Never Used  Substance Use Topics   Alcohol use: Never    Frequency: Never   Drug use: Never    Family History Family History  Problem Relation Age of Onset   Atrial fibrillation Mother     Review of Systems 10 systems were reviewed and are negative except as noted specifically in the HPI.  Objective   Vital signs in last 24 hours: BP (!) 145/78 (BP Location: Right Arm)    Pulse 83    Temp 98 F (36.7 C) (Oral)    Resp (!) 22    Ht 5\' 8"  (1.727 m)    Wt (!) 149.6 kg    LMP 10/20/2019 (Approximate)    SpO2 98%    BMI 50.15 kg/m   Physical Exam General: NAD, A&O, resting, appropriate HEENT: [place]/AT, EOMI, MMM Pulmonary: Normal work of breathing Cardiovascular: HDS, adequate peripheral perfusion Abdomen: Morbidly obese. Soft, NTTP, nondistended.  GU: No CVA tenderness bialterally. Voiding spontaneously. Extremities: warm and well perfused Neuro: Appropriate, no focal neurological deficits Skin: Does endorse tender skin over her lateral abdomen and flank but no deep tenderness.   Most Recent Labs: Lab Results  Component Value Date   WBC 6.1 10/23/2019   HGB 10.5 (L) 10/23/2019   HCT 34.4 (L) 10/23/2019   PLT 197 10/23/2019    Lab Results  Component Value Date   NA 137 10/23/2019   K 4.6 10/23/2019   CL 106 10/23/2019   CO2 20 (L) 10/23/2019   BUN 42 (H) 10/23/2019   CREATININE 2.24 (H) 10/23/2019   CALCIUM 8.6 (L) 10/23/2019   MG 2.0 10/23/2019    Lab Results  Component Value Date   INR 1.4 (H) 10/21/2019   APTT 132 (H) 10/21/2019   IMAGING: US Renal  Result Date: 10/23/2019 CLINICAL  DATA:  Acute kidney injury, history type II diabetes mellitus, hypertension EXAM: RENAL / URINARY TRACT ULTRASOUND COMPLETE COMPARISON:  None FINDINGS: Right Kidney: Renal measurements: 12.6 x 6.2 x 5.8 cm = volume: 240 mL. Cortical thinning. Normal cortical echogenicity. Multiple large shadowing echogenic foci within kidney consistent with large calculi, suspect staghorn calculi. No gross hydronephrosis or renal mass. Left Kidney: Renal measurements: 12.1 x 6.7 x 6.9 cm = volume: 290 mL. Cortical thinning. Normal cortical echogenicity. Large echogenic foci within LEFT kidney likely representing large staghorn calculi. No obvious mass or hydronephrosis. Bladder: Appears normal for degree of bladder distention. Other: N/A IMPRESSION: BILATERAL renal cortical atrophy without obvious renal mass. Suspected staghorn calculi within both kidneys; this can be confirmed by abdominal radiograph. Electronically Signed   By: Lavonia Dana M.D.   On: 10/23/2019 08:17   Dg Abd Portable 1v  Result Date: 10/23/2019 CLINICAL DATA:  Kidney stone,  bilateral abdominal pain. EXAM: PORTABLE ABDOMEN - 1 VIEW COMPARISON:  None. FINDINGS: Body habitus degrades image quality. Large staghorn calculi are seen in the kidneys bilaterally. IMPRESSION: Large bilateral staghorn renal calculi. Electronically Signed   By: Lorin Picket M.D.   On: 10/23/2019 14:33   OSH CT 07/13/2018: Large staghorn calculi nearly completely fill the collecting systems bilaterally. Diffuse urothelial thickening throughout both collecting systems.  Large heterogeneously enhancing uterine mass measuring approximately 18.5 x 15.2 x 18.2 cm. Bilateral ovarian cysts may be physiologic.  ------  Assessment:  54 y.o. female with multiple comorbidities including uncontrolled diabetes, HTN, morbid obesity, new systolic heart failure (EF ~20%), new atrial fibrillation with RVR, new acute kidney injury, and bilateral nephrolithiasis.   Acute Kidney Injury: This  is likely multifactorial. Renal atrophy is likely secondary to medical renal disease given her uncontrolled diabetes as well as HTN. It is possible her bilateral stone disease has caused progressive injury, although would not anticipate this over the past year (Cr July 2019 was 1.0; presented at 1.7). Her cardiac status may also be contributing. Would obtain CT to evaluate for hydronephrosis and tentatively plan for nonemergently bilateral percutaneous nephrostomy tubes to optimize urinary drainage and ideal improve renal function; pending CT results. Unlikely that bilateral PCNs will normalize renal function completely.   Pyruia/Bacteruria: This is in the setting of known bilateral staghorns; expected. She does not have any new urinary symptoms, dysuria, hematuria, or flank pain. Highly unlikely she has an active UTI; although she could be colonized. Would not treat empirically with antibiotics at this time. Follow up urine culture.   Bilateral Nephrolithiasis: Given her known large volume stone disease, this is likely chronic. Would obtain CT stone protocol to characterize stone burden and anatomy for furture intervention as above. Anticipate treatment would require PCNLs bialterally, or pending overall renal function and assessment of parenchyma, may need functional study if nephrectomy were to be considered. No emergent urologic treatment is required.   Recommendations: - CT stone protocol tonight; ordered - Follow up urine culture - Please make NPO at midnight for possible IR bilateral PCNs tomorrow, pending imaging  - AM renal function panel or BMP - If concerns for fever or infection, please page urology  Thank you for this consult. Please contact the urology consult pager with any further questions/concerns.

## 2019-10-23 NOTE — Progress Notes (Signed)
Cardiology Progress Note  Patient ID: Adrienne Lane MRN: [id number] DOB: 1966-12-18 Date of Encounter: 10/23/2019  Primary Cardiologist: No primary care provider on file.  Subjective  2.3 L urine output overnight.  -956 cc.  Appears to be in and out of atrial fibrillation.  Metoprolol was held overnight.  Reports she has been urinating but would like to get more fluid off.  ROS:  All other ROS reviewed and negative. Pertinent positives noted in the HPI.     Inpatient Medications  Scheduled Meds: .  HYDROmorphone (DILAUDID) injection  1 mg Intravenous Once  . insulin aspart  0-5 Units Subcutaneous QHS  . insulin aspart  0-9 Units Subcutaneous TID WC  . insulin glargine  12 Units Subcutaneous Daily  . isosorbide-hydrALAZINE  2 tablet Oral TID  . metoprolol tartrate  50 mg Oral Q6H   Continuous Infusions: . furosemide Stopped (10/23/19 0947)  . heparin 1,750 Units/hr (10/23/19 0215)   PRN Meds: acetaminophen **OR** acetaminophen, traMADol   Vital Signs   Vitals:   10/22/19 1051 10/22/19 2101 10/22/19 2339 10/23/19 0607  BP: (!) 157/108 130/85 (!) 142/83 133/72  Pulse: 94 (!) 43 (!) 49 (!) 42  Resp: 20 20  20   Temp: 98 F (36.7 C) 97.6 F (36.4 C)  97.8 F (36.6 C)  TempSrc: Oral Oral  Oral  SpO2: 97% 100% 98% 100%  Weight:      Height:        Intake/Output Summary (Last 24 hours) at 10/23/2019 1352 Last data filed at 10/23/2019 1200 Gross per 24 hour  Intake 1298.19 ml  Output 2251 ml  Net -952.81 ml   Last 3 Weights 10/22/2019 10/21/2019 10/21/2019  Weight (lbs) 329 lb 12.9 oz 328 lb 1.6 oz 280 lb  Weight (kg) 149.6 kg 148.825 kg 127.007 kg      Telemetry  Overnight telemetry shows atrial fibrillation with intermittent sinus rhythm, frequent PVCs, which I personally reviewed.   Physical Exam   Vitals:   10/22/19 1051 10/22/19 2101 10/22/19 2339 10/23/19 0607  BP: (!) 157/108 130/85 (!) 142/83 133/72  Pulse: 94 (!) 43 (!) 49 (!) 42  Resp: 20 20  20   Temp:  98 F (36.7 C) 97.6 F (36.4 C)  97.8 F (36.6 C)  TempSrc: Oral Oral  Oral  SpO2: 97% 100% 98% 100%  Weight:      Height:         Intake/Output Summary (Last 24 hours) at 10/23/2019 1352 Last data filed at 10/23/2019 1200 Gross per 24 hour  Intake 1298.19 ml  Output 2251 ml  Net -952.81 ml    Last 3 Weights 10/22/2019 10/21/2019 10/21/2019  Weight (lbs) 329 lb 12.9 oz 328 lb 1.6 oz 280 lb  Weight (kg) 149.6 kg 148.825 kg 127.007 kg    Body mass index is 50.15 kg/m.  General: Well nourished, well developed, in no acute distress Head: Atraumatic, normal size  Eyes: PEERLA, EOMI  Neck: Supple, JVD noted to be around 15 cm of water Endocrine: No thryomegaly Cardiac: Irregular rhythm Lungs: Crackles present bilaterally Abd: Soft, nontender, no hepatomegaly  Ext: No edema, 2+ pitting edema to knees Musculoskeletal: No deformities, BUE and BLE strength normal and equal Skin: Warm and dry, no rashes   Neuro: Alert and oriented to person, place, time, and situation, CNII-XII grossly intact, no focal deficits  Psych: Normal mood and affect   Labs  High Sensitivity Troponin:   Recent Labs  Lab 10/20/19 2028 10/20/19 2245  TROPONINIHS 103*  83*     Cardiac EnzymesNo results for input(s): TROPONINI in the last 168 hours. No results for input(s): TROPIPOC in the last 168 hours.  Chemistry Recent Labs  Lab 10/22/19 0925 10/22/19 1716 10/23/19 0424  NA 138 138 137  K 3.4* 4.3 4.6  CL 104 106 106  CO2 23 23 20*  GLUCOSE 226* 251* 221*  BUN 37* 36* 42*  CREATININE 1.98* 2.08* 2.24*  CALCIUM 8.8* 8.9 8.6*  PROT 6.5  --   --   ALBUMIN 2.8*  --   --   AST 23  --   --   ALT 22  --   --   ALKPHOS 69  --   --   BILITOT 1.2  --   --   GFRNONAA 29* 27* 25*  GFRAA 33* 31* 28*  ANIONGAP 11 9 11     Hematology Recent Labs  Lab 10/21/19 0703 10/22/19 0014 10/23/19 0424  WBC 5.8 6.7 6.1  RBC 4.36 4.32 4.32  HGB 10.4* 10.5* 10.5*  HCT 35.1* 34.9* 34.4*  MCV 80.5 80.8 79.6*   MCH 23.9* 24.3* 24.3*  MCHC 29.6* 30.1 30.5  RDW 18.6* 18.6* 18.8*  PLT 173 171 197   BNP Recent Labs  Lab 10/20/19 2029  BNP 1,623.1*    DDimer No results for input(s): DDIMER in the last 168 hours.   Radiology  US Renal  Result Date: 10/23/2019 CLINICAL DATA:  Acute kidney injury, history type II diabetes mellitus, hypertension EXAM: RENAL / URINARY TRACT ULTRASOUND COMPLETE COMPARISON:  None FINDINGS: Right Kidney: Renal measurements: 12.6 x 6.2 x 5.8 cm = volume: 240 mL. Cortical thinning. Normal cortical echogenicity. Multiple large shadowing echogenic foci within kidney consistent with large calculi, suspect staghorn calculi. No gross hydronephrosis or renal mass. Left Kidney: Renal measurements: 12.1 x 6.7 x 6.9 cm = volume: 290 mL. Cortical thinning. Normal cortical echogenicity. Large echogenic foci within LEFT kidney likely representing large staghorn calculi. No obvious mass or hydronephrosis. Bladder: Appears normal for degree of bladder distention. Other: N/A IMPRESSION: BILATERAL renal cortical atrophy without obvious renal mass. Suspected staghorn calculi within both kidneys; this can be confirmed by abdominal radiograph. Electronically Signed   By: Lavonia Dana M.D.   On: 10/23/2019 08:17    Cardiac Studies  TTE 10/21/2019   1. Left ventricular ejection fraction, by visual estimation, is 20 to 25%. The left ventricle has severely decreased function. There is mildly increased left ventricular hypertrophy.  2. Left ventricular diastolic parameters are indeterminate.  3. The left ventricle demonstrates global hypokinesis.  4. Global right ventricle has normal systolic function.The right ventricular size is mildly enlarged. No increase in right ventricular wall thickness.  5. Left atrial size was moderately dilated.  6. Right atrial size was moderately dilated.  7. The pericardial effusion is circumferential.  8. Trivial pericardial effusion is present.  9. The mitral valve  is normal in structure. Mild to moderate mitral valve regurgitation. No evidence of mitral stenosis. 10. The tricuspid valve is normal in structure. Tricuspid valve regurgitation moderate-severe. 11. The aortic valve is normal in structure. Aortic valve regurgitation is not visualized. No evidence of aortic valve sclerosis or stenosis. 12. The pulmonic valve was normal in structure. Pulmonic valve regurgitation is not visualized. 13. Moderately elevated pulmonary artery systolic pressure. 14. The tricuspid regurgitant velocity is 3.29 m/s, and with an assumed right atrial pressure of 15 mmHg, the estimated right ventricular systolic pressure is moderately elevated at 58.3 mmHg. 15. The inferior vena  cava is dilated in size with <50% respiratory variability, suggesting right atrial pressure of 15 mmHg.  Patient Profile  Adrienne Lane is a 53 y.o. female with history of diabetes (A1c 11.1), CKD, obesity who was admitted on 11/1 for new onset systolic heart failure as well as atrial fibrillation with RVR.  This is a new diagnosis for her.  Assessment & Plan  1.  New onset systolic heart failure, ejection fraction 20 to 25%, with gross volume overload -Unclear etiology at this point, but longstanding history of diabetes/hypertension.  She does have atrial fibrillation, which can cause a cardiomyopathy.  However this is unclear what came first. -EKG without any acute ischemic changes however she will need an ischemia evaluation prior to discharge -We will continue with Lasix 120 mg twice daily with potassium supplementation to see if her urine output picks up.  I have added appropriate afterload reduction (BiDil).  We will continue metoprolol tartrate 50 mg every 6 for better rate control.  She appears to be going in and out of atrial fibrillation and we will get an EKG to confirm this. -Her serum creatinine has increased and renal ultrasound shows atrophy consistent with medical renal disease.  There is  concern for staghorn calculi and we will obtain abdominal x-ray.  If urine output does not increase over the next 24 hours we will have nephrology evaluate her. -The goal will be to get a left heart catheterization done as well as right heart cath but with her kidney function and volume overload we are not able to proceed with this at this time.  I suspect she will have several more days before we contemplate a cardiac cath.  2.  Atrial fibrillation with RVR -TSH 4.6, but T4 is within normal limits -Unclear if this is the cause of her cardiomyopathy or if she developed heart failure first and then A. Fib -We will continue with metoprolol tartrate 50 mg every 6 hours for rate control  -Some of her telemetry looks like she is in and out of atrial fibrillation we will obtain an ECG to confirm this -Continue heparin drip, as she will likely need cardiac cath and a TEE/DCCV -We need to get as much fluid off and get her kidney function as best as possible before any of the above plan procedures   3. AKI -Primary team working up for possible urinary infection -Renal ultrasound with possible renal calculi; abdominal x-ray pending -If urine output does not improve we will need to get nephrology to see her    For questions or updates, please contact [place] Please consult [email or web address] for contact info under   Signed, [place] T. Audie Box, [place]  10/23/2019 1:52 PM

## 2019-10-23 NOTE — Telephone Encounter (Signed)
Message received from Purcell Mouton, RN CM requesting a hospital follow up appointment for the patient.  Informed her that an appt has been scheduled for 11/06/2019 @ 1010 @ [facility].

## 2019-10-24 ENCOUNTER — Inpatient Hospital Stay (HOSPITAL_COMMUNITY): Payer: Self-pay

## 2019-10-24 LAB — GLUCOSE, CAPILLARY
Glucose-Capillary: 191 mg/dL — ABNORMAL HIGH (ref 70–99)
Glucose-Capillary: 226 mg/dL — ABNORMAL HIGH (ref 70–99)
Glucose-Capillary: 270 mg/dL — ABNORMAL HIGH (ref 70–99)
Glucose-Capillary: 333 mg/dL — ABNORMAL HIGH (ref 70–99)
Glucose-Capillary: 240 mg/dL — ABNORMAL HIGH (ref 70–99)

## 2019-10-24 LAB — CBC
HCT: 32.3 % — ABNORMAL LOW (ref 36.0–46.0)
Hemoglobin: 9.7 g/dL — ABNORMAL LOW (ref 12.0–15.0)
MCH: 23.9 pg — ABNORMAL LOW (ref 26.0–34.0)
MCHC: 30 g/dL (ref 30.0–36.0)
MCV: 79.6 fL — ABNORMAL LOW (ref 80.0–100.0)
Platelets: 228 10*3/uL (ref 150–400)
RBC: 4.06 MIL/uL (ref 3.87–5.11)
RDW: 18.7 % — ABNORMAL HIGH (ref 11.5–15.5)
WBC: 5.8 10*3/uL (ref 4.0–10.5)
nRBC: 0 % (ref 0.0–0.2)

## 2019-10-24 LAB — RENAL FUNCTION PANEL
Albumin: 2.4 g/dL — ABNORMAL LOW (ref 3.5–5.0)
Anion gap: 11 (ref 5–15)
BUN: 46 mg/dL — ABNORMAL HIGH (ref 6–20)
CO2: 22 mmol/L (ref 22–32)
Calcium: 8.4 mg/dL — ABNORMAL LOW (ref 8.9–10.3)
Chloride: 106 mmol/L (ref 98–111)
Creatinine, Ser: 2.39 mg/dL — ABNORMAL HIGH (ref 0.44–1.00)
GFR calc Af Amer: 26 mL/min — ABNORMAL LOW (ref >60)
GFR calc non Af Amer: 23 mL/min — ABNORMAL LOW (ref >60)
Glucose, Bld: 229 mg/dL — ABNORMAL HIGH (ref 70–99)
Phosphorus: 3.9 mg/dL (ref 2.5–4.6)
Potassium: 3.8 mmol/L (ref 3.5–5.1)
Sodium: 139 mmol/L (ref 135–145)

## 2019-10-24 LAB — URINE CULTURE

## 2019-10-24 LAB — HEPARIN LEVEL (UNFRACTIONATED): Heparin Unfractionated: 0.44 IU/mL (ref 0.30–0.70)

## 2019-10-24 MED ORDER — AMIODARONE HCL 200 MG PO TABS
200.0000 mg | ORAL_TABLET | Freq: Every day | ORAL | Status: DC
  Administered 2019-10-31: 08:00:00 200 mg via ORAL
  Filled 2019-10-24: qty 1

## 2019-10-24 MED ORDER — AMIODARONE HCL 200 MG PO TABS
400.0000 mg | ORAL_TABLET | Freq: Two times a day (BID) | ORAL | Status: AC
  Administered 2019-10-24 – 2019-10-30 (×14): 400 mg via ORAL
  Filled 2019-10-24 (×15): qty 2

## 2019-10-24 NOTE — TOC Progression Note (Signed)
Transition of Care [facility]) - Progression Note    Patient Details  Name: Adrienne Lane MRN: [id number] Date of Birth: November 18, 1966  [facility] CM/SW Contact  Purcell Mouton, RN Phone Number: 10/24/2019, 2:41 PM  Clinical Narrative:    Appointment on 11/17 at 1010 AM at [place]. Pt is aware anf encouraged to keep this appointment.    Expected Discharge Plan: Home/Self Care Barriers to Discharge: No Barriers Identified  Expected Discharge Plan and Services Expected Discharge Plan: Home/Self Care       Living arrangements for the past 2 months: Single Family Home                                       Social Determinants of Health (SDOH) Interventions    Readmission Risk Interventions No flowsheet data found.

## 2019-10-24 NOTE — Progress Notes (Signed)
[place] for IV heparin Indication: atrial fibrillation  No Known Allergies  Patient Measurements: Height: 5\' 8"  (172.7 cm) Weight: (!) 330 lb 0.5 oz (149.7 kg) IBW/kg (Calculated) : 63.9 Heparin Dosing Weight: 100.6 kg  Vital Signs: Temp: 97.8 F (36.6 C) (11/04 0600) Temp Source: Oral (11/04 0600) BP: 123/83 (11/04 0600) Pulse Rate: 77 (11/04 0600)  Labs: Recent Labs    10/22/19 0014  10/22/19 1716 10/23/19 0424 10/24/19 0410  HGB 10.5*  --   --  10.5* 9.7*  HCT 34.9*  --   --  34.4* 32.3*  PLT 171  --   --  197 228  HEPARINUNFRC 0.10*   < > 0.31 0.42 0.44  CREATININE  --    < > 2.08* 2.24* 2.39*   < > = values in this interval not displayed.    Estimated Creatinine Clearance: 43.2 mL/min (A) (by C-G formula based on SCr of 2.39 mg/dL (H)).   Assessment: 40 yoF with PMH DM2, HTN, presents with R hip pain after falling. Also reports several months of LE edema, orthopnea, and abdominal distention. Found to have elevated BNP and to also be in AFib with RVR. Pharmacy to dose heparin   Baseline INR, aPTT: elevated; charted as being drawn before heparin started, but RN notes report drawing aPTT/INR while heparin running  Prior anticoagulation: none  10/24/2019  heparin level 0.44 remains therapeutic on heparin at 1750 units/hr Hg down slightly to 9.7, no bleeding reported  Goal of Therapy: Heparin level 0.3-0.7 units/ml Monitor platelets by anticoagulation protocol: Yes  Plan:  Continue heparin drip at 1750 units/hr  Daily CBC, daily heparin level   Monitor for signs of bleeding or thrombosis  Issues with coverage after moving to [place]; recommend CM consult to assist in determining preferred oral anticoagulant  To continue heparin drip for now as she will likely need cardiac cath and TEE/DCCV   Eudelia Bunch, Pharm.D [phone] 10/24/2019 9:08 AM

## 2019-10-24 NOTE — Progress Notes (Signed)
Cardiology Progress Note  Patient ID: Adrienne Lane MRN: [id number] DOB: 1966/04/04 Date of Encounter: 10/24/2019  Primary Cardiologist: Donato Heinz, MD  Subjective  Urine output remains suboptimal.  Creatinine continues to rise.  Staghorn renal calculi noted and urology has evaluated her without plans for intervention.  Also noted to have a uterine mass.  ROS:  All other ROS reviewed and negative. Pertinent positives noted in the HPI.     Inpatient Medications  Scheduled Meds:  insulin aspart  0-5 Units Subcutaneous QHS   insulin aspart  0-9 Units Subcutaneous TID WC   insulin glargine  12 Units Subcutaneous Daily   isosorbide-hydrALAZINE  2 tablet Oral TID   metoprolol tartrate  50 mg Oral Q6H   Continuous Infusions:  heparin 1,750 Units/hr (10/24/19 0921)   PRN Meds: acetaminophen **OR** acetaminophen, traMADol   Vital Signs   Vitals:   10/23/19 1636 10/23/19 2013 10/24/19 0500 10/24/19 0600  BP: (!) 145/78 125/76  123/83  Pulse: 83 (!) 42  77  Resp: (!) 22 20  20   Temp: 98 F (36.7 C) (!) 97.5 F (36.4 C)  97.8 F (36.6 C)  TempSrc: Oral Oral  Oral  SpO2: 98% 100%  100%  Weight:   (!) 150.5 kg (!) 149.7 kg  Height:        Intake/Output Summary (Last 24 hours) at 10/24/2019 1135 Last data filed at 10/24/2019 1000 Gross per 24 hour  Intake 1351.93 ml  Output 1701 ml  Net -349.07 ml   Last 3 Weights 10/24/2019 10/24/2019 10/22/2019  Weight (lbs) 330 lb 0.5 oz 331 lb 12.7 oz 329 lb 12.9 oz  Weight (kg) 149.7 kg 150.5 kg 149.6 kg      Telemetry  Overnight telemetry shows she appears to be having paroxysmal atrial fibrillation, which I personally reviewed.   ECG  The most recent ECG shows sinus rhythm with paroxysmal A. fib and PVCs, which I personally reviewed.   Physical Exam   Vitals:   10/23/19 1636 10/23/19 2013 10/24/19 0500 10/24/19 0600  BP: (!) 145/78 125/76  123/83  Pulse: 83 (!) 42  77  Resp: (!) 22 20  20   Temp: 98 F (36.7  C) (!) 97.5 F (36.4 C)  97.8 F (36.6 C)  TempSrc: Oral Oral  Oral  SpO2: 98% 100%  100%  Weight:   (!) 150.5 kg (!) 149.7 kg  Height:         Intake/Output Summary (Last 24 hours) at 10/24/2019 1135 Last data filed at 10/24/2019 1000 Gross per 24 hour  Intake 1351.93 ml  Output 1701 ml  Net -349.07 ml    Last 3 Weights 10/24/2019 10/24/2019 10/22/2019  Weight (lbs) 330 lb 0.5 oz 331 lb 12.7 oz 329 lb 12.9 oz  Weight (kg) 149.7 kg 150.5 kg 149.6 kg    Body mass index is 50.18 kg/m.  General: Well nourished, well developed, in no acute distress Head: Atraumatic, normal size  Eyes: PEERLA, EOMI  Neck: Supple, JVD noted up to earlobes around 15 to 18 cm of water Endocrine: No thryomegaly Cardiac: Irregular rhythm Lungs: Crackles at lung bases Abd: Soft, nontender, no hepatomegaly  Ext: 2+ pitting edema Musculoskeletal: No deformities, BUE and BLE strength normal and equal Skin: Warm and dry, no rashes   Neuro: Alert and oriented to person, place, time, and situation, CNII-XII grossly intact, no focal deficits  Psych: Normal mood and affect   Labs  High Sensitivity Troponin:   Recent Labs  Lab 10/20/19  2028 10/20/19 2245  TROPONINIHS 103* 83*     Cardiac EnzymesNo results for input(s): TROPONINI in the last 168 hours. No results for input(s): TROPIPOC in the last 168 hours.  Chemistry Recent Labs  Lab 10/22/19 0925 10/22/19 1716 10/23/19 0424 10/24/19 0410  NA 138 138 137 139  K 3.4* 4.3 4.6 3.8  CL 104 106 106 106  CO2 23 23 20* 22  GLUCOSE 226* 251* 221* 229*  BUN 37* 36* 42* 46*  CREATININE 1.98* 2.08* 2.24* 2.39*  CALCIUM 8.8* 8.9 8.6* 8.4*  PROT 6.5  --   --   --   ALBUMIN 2.8*  --   --  2.4*  AST 23  --   --   --   ALT 22  --   --   --   ALKPHOS 69  --   --   --   BILITOT 1.2  --   --   --   GFRNONAA 29* 27* 25* 23*  GFRAA 33* 31* 28* 26*  ANIONGAP 11 9 11 11     Hematology Recent Labs  Lab 10/22/19 0014 10/23/19 0424 10/24/19 0410  WBC 6.7 6.1  5.8  RBC 4.32 4.32 4.06  HGB 10.5* 10.5* 9.7*  HCT 34.9* 34.4* 32.3*  MCV 80.8 79.6* 79.6*  MCH 24.3* 24.3* 23.9*  MCHC 30.1 30.5 30.0  RDW 18.6* 18.8* 18.7*  PLT 171 197 228   BNP Recent Labs  Lab 10/20/19 2029  BNP 1,623.1*    DDimer No results for input(s): DDIMER in the last 168 hours.   Radiology  US Renal  Result Date: 10/23/2019 CLINICAL DATA:  Acute kidney injury, history type II diabetes mellitus, hypertension EXAM: RENAL / URINARY TRACT ULTRASOUND COMPLETE COMPARISON:  None FINDINGS: Right Kidney: Renal measurements: 12.6 x 6.2 x 5.8 cm = volume: 240 mL. Cortical thinning. Normal cortical echogenicity. Multiple large shadowing echogenic foci within kidney consistent with large calculi, suspect staghorn calculi. No gross hydronephrosis or renal mass. Left Kidney: Renal measurements: 12.1 x 6.7 x 6.9 cm = volume: 290 mL. Cortical thinning. Normal cortical echogenicity. Large echogenic foci within LEFT kidney likely representing large staghorn calculi. No obvious mass or hydronephrosis. Bladder: Appears normal for degree of bladder distention. Other: N/A IMPRESSION: BILATERAL renal cortical atrophy without obvious renal mass. Suspected staghorn calculi within both kidneys; this can be confirmed by abdominal radiograph. Electronically Signed   By: Lavonia Dana M.D.   On: 10/23/2019 08:17   Dg Abd Portable 1v  Result Date: 10/23/2019 CLINICAL DATA:  Kidney stone, bilateral abdominal pain. EXAM: PORTABLE ABDOMEN - 1 VIEW COMPARISON:  None. FINDINGS: Body habitus degrades image quality. Large staghorn calculi are seen in the kidneys bilaterally. IMPRESSION: Large bilateral staghorn renal calculi. Electronically Signed   By: Lorin Picket M.D.   On: 10/23/2019 14:33   Ct Renal Stone Study  Addendum Date: 10/23/2019   ADDENDUM REPORT: 10/23/2019 21:29 ADDENDUM: Findings and recommendations were discussed with NP Baltazar Najjar via telephone on 10/23/2019 at 9:29 p.m. Electronically Signed   By:  Lovena Le M.D.   On: 10/23/2019 21:29   Result Date: 10/23/2019 CLINICAL DATA:  Renal failure, history of bilateral staghorn calculi with new renal failure EXAM: CT ABDOMEN AND PELVIS WITHOUT CONTRAST TECHNIQUE: Multidetector CT imaging of the abdomen and pelvis was performed following the standard protocol without IV contrast. COMPARISON:  None. FINDINGS: Lower chest: Regions of mosaic attenuation in the lower lungs may reflect areas of air trapping versus imaging during exhalation. No consolidative  opacity. Small right pleural effusion. No left effusion. Marked cardiomegaly with four-chamber enlargement. Coronary artery calcifications are present. No pericardial effusion. Hepatobiliary: No visible or contour deforming liver abnormality is seen. Patient is post cholecystectomy. Slight prominence of the biliary tree likely related to reservoir effect. No calcified intraductal gallstones. Pancreas: Unremarkable. No pancreatic ductal dilatation or surrounding inflammatory changes. Spleen: Normal in size without focal abnormality. Adrenals/Urinary Tract: Adrenal glands are difficult to visualize due to increased attenuation of the mesentery and photon starvation towards the midline abdomen. The kidneys demonstrate extensive bilateral staghorn calculi. No visible hydronephrosis. Difficult to follow the course of either ureter given lack of contrast and fluid in the abdomen. No contour deforming renal lesions are seen. There is circumferential bladder wall thickening. Stomach/Bowel: Distal esophagus, stomach and duodenal sweep are unremarkable. No small bowel dilatation or wall thickening. Nonvisualization of the appendix. No convincing pericecal inflammation. No colonic dilatation or wall thickening. Vascular/Lymphatic: Atherosclerotic plaque within the normal caliber aorta. Assessment of the lymph nodes is limited in the absence of contrast media. Central mesenteric lymph nodes appear slightly engorged and  edematous. Reproductive: There is marked enlargement of the uterine fundus measuring up to 17 x 16 x 16 cm in size. There is heterogeneous attenuation of the uterus. A curvilinear air lucency is noted towards the superior aspect of this masslike enlargement. Difficult to discern the margins of the ovaries. Other: Moderate volume free fluid throughout the abdomen with central mesenteric hazy congestion and edema. Extensive severe body wall edema is seen circumferentially. Fat containing umbilical hernia is noted. No bowel containing hernias. Musculoskeletal: No acute osseous abnormality or suspicious osseous lesion. Multilevel degenerative changes are present in the imaged portions of the spine. Degenerative changes most pronounced at L4-5. 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 IMPRESSION: 1. Imaging quality is significantly degraded due to patient body habitus with resulting central photon starvation. Evaluation is furthermore complicated by lack of intravenous contrast material. 2. Marked enlargement of the uterine fundus measuring up to 17 cm in size, with heterogeneous attenuation and a an irregular curvilinear air lucency towards the superior aspect of this masslike enlargement. 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 Recommend further evaluation with pelvic ultrasound. 3. Extensive bilateral staghorn calculi. No visible hydronephrosis. Difficult to follow the course of either ureter given lack of contrast and fluid extensive fluid in the abdomen. 4. Severe anasarca with extensive body wall edema, mesenteric congestion, ascites, and right pleural effusion. 5. Circumferential thickening of the urinary bladder. Correlate with urinalysis to exclude cystitis. 6. Marked cardiomegaly with four-chamber enlargement. 7. Regions of mosaic attenuation in the lower lungs may reflect areas of air trapping versus imaging during exhalation. 8. Aortic  Atherosclerosis (ICD10-I70.0). Electronically Signed: By: Lovena Le M.D. On: 10/23/2019 20:58    Cardiac Studies  TTE 10/21/2019 1. Left ventricular ejection fraction, by visual estimation, is 20 to 25%. The left ventricle has severely decreased function. There is mildly increased left ventricular hypertrophy.  2. Left ventricular diastolic parameters are indeterminate.  3. The left ventricle demonstrates global hypokinesis.  4. Global right ventricle has normal systolic function.The right ventricular size is mildly enlarged. No increase in right ventricular wall thickness.  5. Left atrial size was moderately dilated.  6. Right atrial size was moderately dilated.  7. The pericardial effusion is circumferential.  8. Trivial pericardial effusion is present.  9. The mitral valve is normal in structure. Mild to moderate mitral valve regurgitation. No evidence of  mitral stenosis. 10. The tricuspid valve is normal in structure. Tricuspid valve regurgitation moderate-severe. 11. The aortic valve is normal in structure. Aortic valve regurgitation is not visualized. No evidence of aortic valve sclerosis or stenosis. 12. The pulmonic valve was normal in structure. Pulmonic valve regurgitation is not visualized. 13. Moderately elevated pulmonary artery systolic pressure. 14. The tricuspid regurgitant velocity is 3.29 m/s, and with an assumed right atrial pressure of 15 mmHg, the estimated right ventricular systolic pressure is moderately elevated at 58.3 mmHg. 15. The inferior vena cava is dilated in size with <50% respiratory variability, suggesting right atrial pressure of 15 mmHg.  CT A/P  1. Imaging quality is significantly degraded due to patient body habitus with resulting central photon starvation. Evaluation is furthermore complicated by lack of intravenous contrast material. 2. Marked enlargement of the uterine fundus measuring up to 17 cm in size, with heterogeneous attenuation and a an  irregular curvilinear air lucency towards the superior aspect of this masslike enlargement. 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 Recommend further evaluation with pelvic ultrasound. 3. Extensive bilateral staghorn calculi. No visible hydronephrosis. Difficult to follow the course of either ureter given lack of contrast and fluid extensive fluid in the abdomen. 4. Severe anasarca with extensive body wall edema, mesenteric congestion, ascites, and right pleural effusion. 5. Circumferential thickening of the urinary bladder. Correlate with urinalysis to exclude cystitis. 6. Marked cardiomegaly with four-chamber enlargement. 7. Regions of mosaic attenuation in the lower lungs may reflect areas of air trapping versus imaging during exhalation. 8. Aortic Atherosclerosis (ICD10-I70.0).   Patient Profile  Trace Cederberg a 53 y.o.femalewith history of diabetes(A1c 11.1),CKD, obesity who was admitted on 11/1 for new onset systolic heart failure as well as atrial fibrillation with RVR. This is a new diagnosis for her.  Assessment & Plan  1.New onset systolic heart failure, ejection fraction 20 to 25%, with gross volume overload -Unclear etiology at this point, but longstanding history of diabetes/hypertension.  She does have atrial fibrillation, which can cause a cardiomyopathy.  However this is unclear what came first. -EKG without any acute ischemic changes however she will need an ischemia evaluation prior to discharge -Urine output is not progressing despite high-dose Lasix.  We will stop this today.  Given that she has staghorn calculi noted and likely some underlying CKD we need nephrology to evaluate this. -She also has a rather large uterine mass and it is unclear if this is contributing to her decreased urine output.  It appears gynecology will evaluate her for this. -She remains grossly volume overloaded on examination and we will need better guidance on her kidney  disease, kidney stones, uterine mass, and if this is playing a role in her reduced urine output. -We will hold her diuretics today -She appears to be in and out of atrial fibrillation and I will continue her metoprolol tartrate 50 mg every 6 hours.  We will likely transition her to metoprolol succinate tomorrow. -Frequent PVCs are noted -We will also continue BiDil 20-37.5 mg 2 tablets 3 times daily -Overall, this is concerning that her urine output is declining and then she is now has significant likely underlying CKD and a uterine mass.  This all will need to be sorted out as we need to get her euvolemic prior to any consideration for heart catheterization.  We also hope that gynecology will be able to sort out what this mass is in any sort of prognosis needed as this will determine heart failure therapy and aggressiveness of  our recommendations.  2.Atrial fibrillation with RVR, paroxysmal -TSH 4.6, but T4 is within normal limits -Unclear if this is the cause of her cardiomyopathy or if she developed heart failure first and then A. Fib -She appears to be in relation. Given that it appears to be paroxysmal I do not think we will pursue cardioversion.  I will keep her on heparin for now as she may have planned procedures coming up in a DOAC may limit this. -I will plan to add amiodarone oral load (400 mg twice daily for 7 days and then 200 mg daily for 21 days and then stop) to help get her paroxysms of it under better control.  I do not want this to be a long-term therapy for her given her young age.  3. AKI -Staghorn renal calculi noted as well as uterine mass -Nephrology will weigh in today on this and gynecology is working up the mass to determine how much this is impacting her kidney function  For questions or updates, please contact [place] Please consult [email or web address] for contact info under   Signed, [place] B[place] T. Audie Box, [place]  10/24/2019 11:35 AM

## 2019-10-24 NOTE — Progress Notes (Signed)
Physical Therapy Treatment Patient Details Name: Adrienne Lane MRN: [id number] DOB: 03-Oct-1966 Today's Date: 10/24/2019    History of Present Illness 53 yo female admitted to ED on 10/29 with fall out of minivan and R hip and LE pain. Xray negative for fracture. Pt found to be in afib with RVR, sHF (new diagnosis). PMH includes DM with A1C 11.7, HTN.    PT Comments    Pt was able to increase gait distance but with some dyspnea.  Required assistance with transfers with cues for technique.  Pt with bradycardia today.  Will cont to benefit from PT to advance endurance for ambulation at home.     Follow Up Recommendations  Supervision for mobility/OOB;No PT follow up     Equipment Recommendations  None recommended by PT    Recommendations for Other Services       Precautions / Restrictions Precautions Precautions: Fall    Mobility  Bed Mobility Overal bed mobility: Needs Assistance Bed Mobility: Supine to Sit;Sit to Supine     Supine to sit: Min assist Sit to supine: Mod assist   General bed mobility comments: min A to pull up with moving to sit and mod A for legs with moving to supine  Transfers Overall transfer level: Needs assistance Equipment used: None Transfers: Sit to/from Stand Sit to Stand: Min guard;From elevated surface         General transfer comment: performed x 2 (from bed and from bsc); required assist toileting ADLS due to IVs in both hands  Ambulation/Gait Ambulation/Gait assistance: Min guard Gait Distance (Feet): 80 Feet Assistive device: IV Pole Gait Pattern/deviations: Step-through pattern;Decreased stride length;Wide base of support Gait velocity: decr   General Gait Details: 1 standing rest break due to dyspnea on exertion; SpO2 100% throughout tx; HR 45-50 bpm (HR low since yesterdya)   Stairs             Wheelchair Mobility    Modified Rankin (Stroke Patients Only)       Balance Overall balance assessment: Needs  assistance Sitting-balance support: No upper extremity supported;Feet supported Sitting balance-Leahy Scale: Good     Standing balance support: No upper extremity supported Standing balance-Leahy Scale: Good                              Cognition Arousal/Alertness: Awake/alert Behavior During Therapy: WFL for tasks assessed/performed Overall Cognitive Status: Within Functional Limits for tasks assessed                                        Exercises      General Comments        Pertinent Vitals/Pain Pain Assessment: Faces Faces Pain Scale: Hurts a little bit Pain Location: R hip and leg Pain Descriptors / Indicators: Sore Pain Intervention(s): Limited activity within patient's tolerance;Repositioned    Home Living                      Prior Function            PT Goals (current goals can now be found in the care plan section) Progress towards PT goals: Progressing toward goals    Frequency    Min 3X/week      PT Plan Current plan remains appropriate    Co-evaluation  AM-PAC PT "6 Clicks" Mobility   Outcome Measure  Help needed turning from your back to your side while in a flat bed without using bedrails?: A Little Help needed moving from lying on your back to sitting on the side of a flat bed without using bedrails?: A Little Help needed moving to and from a bed to a chair (including a wheelchair)?: None Help needed standing up from a chair using your arms (e.g., wheelchair or bedside chair)?: None Help needed to walk in hospital room?: None Help needed climbing 3-5 steps with a railing? : A Little 6 Click Score: 21    End of Session Equipment Utilized During Treatment: Gait belt Activity Tolerance: Patient tolerated treatment well;Patient limited by pain Patient left: in bed;with call bell/phone within reach;with bed alarm set   PT Visit Diagnosis: Other abnormalities of gait and mobility  (R26.89);History of falling (Z91.81);Unsteadiness on feet (R26.81)     Time: 1017-5102 PT Time Calculation (min) (ACUTE ONLY): 28 min  Charges:  $Gait Training: 8-22 mins $Therapeutic Activity: 8-22 mins                     Maggie Font, PT Acute Rehab Services [phone]    [name] 10/24/2019, 2:56 PM

## 2019-10-24 NOTE — TOC Initial Note (Signed)
Transition of Care [facility]) - Initial/Assessment Note    Patient Details  Name: Adrienne Lane MRN: [id number] Date of Birth: 1966-11-02  Transition of Care [facility]) CM/SW Contact:    Adrienne Mouton, RN Phone Number: 10/24/2019, 12:24 PM  Clinical Narrative:                 Pt states that she would like to get better and her kidneys. Pt agreed with finding her an appointment with one of [facility] [place].   Expected Discharge Plan: Home/Self Care Barriers to Discharge: No Barriers Identified   Patient Goals and CMS Choice Patient states their goals for this hospitalization and ongoing recovery are:: To get better.      Expected Discharge Plan and Services Expected Discharge Plan: Home/Self Care       Living arrangements for the past 2 months: Single Family Home                                      Prior Living Arrangements/Services Living arrangements for the past 2 months: Single Family Home Lives with:: Spouse Patient language and need for interpreter reviewed:: No Do you feel safe going back to the place where you live?: Yes               Activities of Daily Living Home Assistive Devices/Equipment: None ADL Screening (condition at time of admission) Patient's cognitive ability adequate to safely complete daily activities?: Yes Is the patient deaf or have difficulty hearing?: No Does the patient have difficulty seeing, even when wearing glasses/contacts?: No Does the patient have difficulty concentrating, remembering, or making decisions?: No Patient able to express need for assistance with ADLs?: Yes Does the patient have difficulty dressing or bathing?: No Independently performs ADLs?: Yes (appropriate for developmental age) Does the patient have difficulty walking or climbing stairs?: Yes Weakness of Legs: Both Weakness of Arms/Hands: Both  Permission Sought/Granted                  Emotional Assessment Appearance:: Appears stated  age   Affect (typically observed): Accepting Orientation: : Oriented to Self, Oriented to Place, Oriented to  Time, Oriented to Situation      Admission diagnosis:  Hyperglycemia [R73.9] Elevated troponin [R77.8] Right hip pain [M25.551] Atrial fibrillation with RVR ([name]) [I48.91] Essential hypertension [I10] Patient Active Problem List   Diagnosis Date Noted  . Right hip pain   . Type 2 diabetes mellitus without complication ([name])   . Essential hypertension   . CHF (congestive heart failure) ([name]) 10/21/2019  . Elevated troponin 10/21/2019  . AKI (acute kidney injury) ([name]) 10/21/2019  . Hyperglycemia 10/21/2019  . Atrial fibrillation with RVR ([name]) 10/21/2019  . Atrial fibrillation with rapid ventricular response ([name]) 10/20/2019   PCP:  Patient, No Pcp Per Pharmacy:   [facility] [address] ([address]), [place] [address] [place] ([place]) [place] [address] Phone: [phone] Fax: [phone]     Social Determinants of Health (SDOH) Interventions    Readmission Risk Interventions No flowsheet data found.

## 2019-10-24 NOTE — Progress Notes (Signed)
PROGRESS NOTE  Adrienne Lane KGU:[id number] DOB: 1966-03-16 DOA: 10/20/2019 PCP: Patient, No Pcp Per   LOS: 4 days   Brief Narrative / Interim history: 53 year old female with history of type 2 diabetes mellitus, hypertension, uterine fibroids in the past, came to the hospital with complaints of right hip pain, this is been going on for the past 2 days after having a fall. She moved from [place] to [place] in December 2019 and could no longer qualify for Medicaid here. Since then, she has not seen a doctor and has not been on any medications for her diabetes and hypertension. She was previously on insulin for her diabetes. Stated she was told about 2.5 to 3 years ago that her heart rate was irregular. She was referred to cardiology back in [place] about 1.5 years ago but it was just an initial visit and she was not started on any medications. States her legs have been swollen and abdomen distended for the past 4 months. Her abdomen feels sore all over. No nausea, vomiting, or diarrhea. She is tolerating p.o. intake and requesting food to eat. Also having orthopnea. Denies chest pain. No other complaints.   Subjective / 24h Interval events: Feeling better, body swelling still present but improved  Assessment & Plan: Principal Problem:   Atrial fibrillation with rapid ventricular response (HCC) Active Problems:   CHF (congestive heart failure) (HCC)   Elevated troponin   AKI (acute kidney injury) ([place])   Hyperglycemia   Atrial fibrillation with RVR (HCC)   Right hip pain   Type 2 diabetes mellitus without complication (HCC)   Essential hypertension   Principal Problem A. fib with RVR -Likely in the setting of heart failure, cardiology consulted and following.  Currently on metoprolol for rate control as well as IV heparin -Was diuresed aggressively, creatinine slightly increased and hold diuretics today -May eventually need cardiac cath and TEE/DCCV, defer to  cardiology  Active Problems Acute systolic CHF -2D echo done showed an EF of 20 to 25% -Initially on Lasix now, with creatinine elevation of this is on hold.  Continue metoprolol for rate control, continue BiDil, will likely need ischemic work-up when fluid optimized  Acute kidney injury -Patient's creatinine in 2019 was 0.9-1.0 range with labs done at [facility]. -Creatinine this admission 1.7, with diuresis gradually increasing and now it is 2.4.  Hold further Lasix. -May need nephrology input if continues to increase -Renal ultrasound negative for hydronephrosis   Bilateral staghorn renal calculi -Urology consulted, no need for interventions at this point.  Urine output 2 L over the last 24 hours -We will need immediate antibiotic coverage if she becomes febrile and urology reconsultation  Type 2 diabetes mellitus -Poorly controlled, with hyperglycemia.  Continue sliding scale and Lantus  Right hip pain secondary to mechanical fall -Negative imaging for fractures  Normocytic anemia -Of chronic disease  Uterine mass -CT scan of the abdomen and pelvis showed marked enlargement of the uterine fundus measuring up to 17 cm with heterogeneous attenuation and irregular curvilinear air lucency towards the superior aspect of this masslike and large amount.  This is concerning for endometrial carcinoma. -Patient apparently is aware of having uterine mass, she was told last year at [facility] that she is having uterine fibroids -Obtain transvaginal pelvic ultrasound to better characterize, if findings are still concerning for malignancy benefit from GYN onc input  Scheduled Meds:  amiodarone  400 mg Oral BID   Followed by   Derrill Memo ON 10/31/2019]  amiodarone  200 mg Oral Daily   insulin aspart  0-5 Units Subcutaneous QHS   insulin aspart  0-9 Units Subcutaneous TID WC   insulin glargine  12 Units Subcutaneous Daily   isosorbide-hydrALAZINE  2 tablet Oral TID   metoprolol  tartrate  50 mg Oral Q6H   Continuous Infusions:  heparin 1,750 Units/hr (10/24/19 0921)   PRN Meds:.acetaminophen **OR** acetaminophen, traMADol  DVT prophylaxis: heparin Code Status: Full code Family Communication: d/w patient  Disposition Plan: home when ready   Consultants:  Cardiology  Urology   Procedures:   2D echo 10/21/2019  Plain films of the right hip and pelvis 10/20/2019  CXR 10/20/2019  Renal ultrasound 10/22/2019  Microbiology  None   Antimicrobials: None     Objective: Vitals:   10/23/19 2013 10/24/19 0500 10/24/19 0600 10/24/19 1255  BP: 125/76  123/83 125/65  Pulse: (!) 42  77 (!) 42  Resp: 20  20 20   Temp: (!) 97.5 F (36.4 C)  97.8 F (36.6 C) 98.1 F (36.7 C)  TempSrc: Oral  Oral Oral  SpO2: 100%  100% 99%  Weight:  (!) 150.5 kg (!) 149.7 kg   Height:        Intake/Output Summary (Last 24 hours) at 10/24/2019 1328 Last data filed at 10/24/2019 1235 Gross per 24 hour  Intake 1591.93 ml  Output 1501 ml  Net 90.93 ml   Filed Weights   10/22/19 0500 10/24/19 0500 10/24/19 0600  Weight: (!) 149.6 kg (!) 150.5 kg (!) 149.7 kg    Examination:  Constitutional: NAD Eyes: PERRL, lids and conjunctivae normal ENMT: Mucous membranes are moist.  Respiratory: clear to auscultation bilaterally, no wheezing, no crackles. Normal respiratory effort.  Cardiovascular: Irregular, no murmurs / rubs / gallops. 2+ pitting LE edema. Good peripheral pulses Abdomen: no tenderness. Bowel sounds positive.  Musculoskeletal: no clubbing / cyanosis.  Skin: no rashes Neurologic: CN 2-12 grossly intact. Strength 5/5 in all 4.  Psychiatric: Normal judgment and insight. Alert and oriented x 3. Normal mood.    Data Reviewed: I have independently reviewed following labs and imaging studies   CBC: Recent Labs  Lab 10/20/19 1958 10/21/19 0703 10/22/19 0014 10/23/19 0424 10/24/19 0410  WBC 6.2 5.8 6.7 6.1 5.8  NEUTROABS 4.3  --   --   --   --   HGB 11.9*  10.4* 10.5* 10.5* 9.7*  HCT 39.7 35.1* 34.9* 34.4* 32.3*  MCV 80.5 80.5 80.8 79.6* 79.6*  PLT PLATELET CLUMPS NOTED ON SMEAR, UNABLE TO ESTIMATE 173 171 197 962   Basic Metabolic Panel: Recent Labs  Lab 10/20/19 2028 10/21/19 0703 10/22/19 0925 10/22/19 1716 10/23/19 0424 10/24/19 0410  NA  --  142 138 138 137 139  K  --  3.6 3.4* 4.3 4.6 3.8  CL  --  108 104 106 106 106  CO2  --  24 23 23  20* 22  GLUCOSE  --  180* 226* 251* 221* 229*  BUN  --  33* 37* 36* 42* 46*  CREATININE  --  1.90* 1.98* 2.08* 2.24* 2.39*  CALCIUM  --  8.9 8.8* 8.9 8.6* 8.4*  MG 2.1  --  1.8  --  2.0  --   PHOS  --   --   --   --   --  3.9   GFR: Estimated Creatinine Clearance: 43.2 mL/min (A) (by C-G formula based on SCr of 2.39 mg/dL (H)). Liver Function Tests: Recent Labs  Lab 10/22/19 9528 10/24/19  0410  AST 23  --   ALT 22  --   ALKPHOS 69  --   BILITOT 1.2  --   PROT 6.5  --   ALBUMIN 2.8* 2.4*   No results for input(s): LIPASE, AMYLASE in the last 168 hours. No results for input(s): AMMONIA in the last 168 hours. Coagulation Profile: Recent Labs  Lab 10/20/19 2304 10/21/19 0710  INR 1.5* 1.4*   Cardiac Enzymes: No results for input(s): CKTOTAL, CKMB, CKMBINDEX, TROPONINI in the last 168 hours. BNP (last 3 results) No results for input(s): PROBNP in the last 8760 hours. HbA1C: No results for input(s): HGBA1C in the last 72 hours. CBG: Recent Labs  Lab 10/23/19 1117 10/23/19 1642 10/23/19 2008 10/24/19 0813 10/24/19 1155  GLUCAP 275* 222* 247* 191* 226*   Lipid Profile: No results for input(s): CHOL, HDL, LDLCALC, TRIG, CHOLHDL, LDLDIRECT in the last 72 hours. Thyroid Function Tests: No results for input(s): TSH, T4TOTAL, FREET4, T3FREE, THYROIDAB in the last 72 hours. Anemia Panel: No results for input(s): VITAMINB12, FOLATE, FERRITIN, TIBC, IRON, RETICCTPCT in the last 72 hours. Urine analysis:    Component Value Date/Time   COLORURINE YELLOW 10/23/2019 1151    APPEARANCEUR CLOUDY (A) 10/23/2019 1151   LABSPEC 1.006 10/23/2019 1151   PHURINE 7.0 10/23/2019 1151   GLUCOSEU NEGATIVE 10/23/2019 1151   HGBUR MODERATE (A) 10/23/2019 1151   BILIRUBINUR NEGATIVE 10/23/2019 1151   KETONESUR NEGATIVE 10/23/2019 1151   PROTEINUR 100 (A) 10/23/2019 1151   NITRITE NEGATIVE 10/23/2019 1151   LEUKOCYTESUR LARGE (A) 10/23/2019 1151   Sepsis Labs: Invalid input(s): PROCALCITONIN, LACTICIDVEN  Recent Results (from the past 240 hour(s))  SARS CORONAVIRUS 2 (TAT 6-24 HRS) Nasopharyngeal Nasopharyngeal Swab     Status: None   Collection Time: 10/20/19 10:45 PM   Specimen: Nasopharyngeal Swab  Result Value Ref Range Status   SARS Coronavirus 2 NEGATIVE NEGATIVE Final    Comment: (NOTE) SARS-CoV-2 target nucleic acids are NOT DETECTED. The SARS-CoV-2 RNA is generally detectable in upper and lower respiratory specimens during the acute phase of infection. Negative results do not preclude SARS-CoV-2 infection, do not rule out co-infections with other pathogens, and should not be used as the sole basis for treatment or other patient management decisions. Negative results must be combined with clinical observations, patient history, and epidemiological information. The expected result is Negative. Fact Sheet for Patients: SugarRoll.be Fact Sheet for Healthcare Providers: https://www.woods-mathews.com/ This test is not yet approved or cleared by the [place] FDA and  has been authorized for detection and/or diagnosis of SARS-CoV-2 by FDA under an Emergency Use Authorization (EUA). This EUA will remain  in effect (meaning this test can be used) for the duration of the COVID-19 declaration under Section 56 4(b)(1) of the Act, 21 U.S.C. section 360bbb-3(b)(1), unless the authorization is terminated or revoked sooner. Performed at [facility], [place] [address], [place], [place] [address]   Culture, Urine      Status: Abnormal   Collection Time: 10/23/19 11:51 AM   Specimen: Urine, Catheterized  Result Value Ref Range Status   Specimen Description   Final    URINE, CATHETERIZED Performed at [facility] [address], [place], [place] [address]    Special Requests   Final    NONE Performed at [facility], [place] [address] [place], [place] [address]    Culture MULTIPLE SPECIES PRESENT, SUGGEST RECOLLECTION (A)  Final   Report Status 10/24/2019 FINAL  Final  Radiology Studies: US Renal  Result Date: 10/23/2019 CLINICAL DATA:  Acute kidney injury, history type II diabetes mellitus, hypertension EXAM: RENAL / URINARY TRACT ULTRASOUND COMPLETE COMPARISON:  None FINDINGS: Right Kidney: Renal measurements: 12.6 x 6.2 x 5.8 cm = volume: 240 mL. Cortical thinning. Normal cortical echogenicity. Multiple large shadowing echogenic foci within kidney consistent with large calculi, suspect staghorn calculi. No gross hydronephrosis or renal mass. Left Kidney: Renal measurements: 12.1 x 6.7 x 6.9 cm = volume: 290 mL. Cortical thinning. Normal cortical echogenicity. Large echogenic foci within LEFT kidney likely representing large staghorn calculi. No obvious mass or hydronephrosis. Bladder: Appears normal for degree of bladder distention. Other: N/A IMPRESSION: BILATERAL renal cortical atrophy without obvious renal mass. Suspected staghorn calculi within both kidneys; this can be confirmed by abdominal radiograph. Electronically Signed   By: Lavonia Dana M.D.   On: 10/23/2019 08:17   Dg Abd Portable 1v  Result Date: 10/23/2019 CLINICAL DATA:  Kidney stone, bilateral abdominal pain. EXAM: PORTABLE ABDOMEN - 1 VIEW COMPARISON:  None. FINDINGS: Body habitus degrades image quality. Large staghorn calculi are seen in the kidneys bilaterally. IMPRESSION: Large bilateral staghorn renal calculi. Electronically Signed   By: Lorin Picket M.D.   On: 10/23/2019 14:33   Ct Renal  Stone Study  Addendum Date: 10/23/2019   ADDENDUM REPORT: 10/23/2019 21:29 ADDENDUM: Findings and recommendations were discussed with NP Baltazar Najjar via telephone on 10/23/2019 at 9:29 p.m. Electronically Signed   By: Lovena Le M.D.   On: 10/23/2019 21:29   Result Date: 10/23/2019 CLINICAL DATA:  Renal failure, history of bilateral staghorn calculi with new renal failure EXAM: CT ABDOMEN AND PELVIS WITHOUT CONTRAST TECHNIQUE: Multidetector CT imaging of the abdomen and pelvis was performed following the standard protocol without IV contrast. COMPARISON:  None. FINDINGS: Lower chest: Regions of mosaic attenuation in the lower lungs may reflect areas of air trapping versus imaging during exhalation. No consolidative opacity. Small right pleural effusion. No left effusion. Marked cardiomegaly with four-chamber enlargement. Coronary artery calcifications are present. No pericardial effusion. Hepatobiliary: No visible or contour deforming liver abnormality is seen. Patient is post cholecystectomy. Slight prominence of the biliary tree likely related to reservoir effect. No calcified intraductal gallstones. Pancreas: Unremarkable. No pancreatic ductal dilatation or surrounding inflammatory changes. Spleen: Normal in size without focal abnormality. Adrenals/Urinary Tract: Adrenal glands are difficult to visualize due to increased attenuation of the mesentery and photon starvation towards the midline abdomen. The kidneys demonstrate extensive bilateral staghorn calculi. No visible hydronephrosis. Difficult to follow the course of either ureter given lack of contrast and fluid in the abdomen. No contour deforming renal lesions are seen. There is circumferential bladder wall thickening. Stomach/Bowel: Distal esophagus, stomach and duodenal sweep are unremarkable. No small bowel dilatation or wall thickening. Nonvisualization of the appendix. No convincing pericecal inflammation. No colonic dilatation or wall thickening.  Vascular/Lymphatic: Atherosclerotic plaque within the normal caliber aorta. Assessment of the lymph nodes is limited in the absence of contrast media. Central mesenteric lymph nodes appear slightly engorged and edematous. Reproductive: There is marked enlargement of the uterine fundus measuring up to 17 x 16 x 16 cm in size. There is heterogeneous attenuation of the uterus. A curvilinear air lucency is noted towards the superior aspect of this masslike enlargement. Difficult to discern the margins of the ovaries. Other: Moderate volume free fluid throughout the abdomen with central mesenteric hazy congestion and edema. Extensive severe body wall edema is seen circumferentially. Fat containing umbilical hernia is noted. No bowel  containing hernias. Musculoskeletal: No acute osseous abnormality or suspicious osseous lesion. Multilevel degenerative changes are present in the imaged portions of the spine. Degenerative changes most pronounced at L4-5. 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 IMPRESSION: 1. Imaging quality is significantly degraded due to patient body habitus with resulting central photon starvation. Evaluation is furthermore complicated by lack of intravenous contrast material. 2. Marked enlargement of the uterine fundus measuring up to 17 cm in size, with heterogeneous attenuation and a an irregular curvilinear air lucency towards the superior aspect of this masslike enlargement. 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 Recommend further evaluation with pelvic ultrasound. 3. Extensive bilateral staghorn calculi. No visible hydronephrosis. Difficult to follow the course of either ureter given lack of contrast and fluid extensive fluid in the abdomen. 4. Severe anasarca with extensive body wall edema, mesenteric congestion, ascites, and right pleural effusion. 5. Circumferential thickening of the urinary bladder. Correlate with urinalysis to  exclude cystitis. 6. Marked cardiomegaly with four-chamber enlargement. 7. Regions of mosaic attenuation in the lower lungs may reflect areas of air trapping versus imaging during exhalation. 8. Aortic Atherosclerosis (ICD10-I70.0). Electronically Signed: By: Lovena Le M.D. On: 10/23/2019 20:58   Marzetta Board, MD, PhD [facility]  Contact via  [email or web address]  [place] P: [phone] F: [phone]

## 2019-10-24 NOTE — Consult Note (Signed)
Urology Consult Progress Note   Requesting Attending Physician:  Caren Griffins, MD Service Providing Consult: Urology  Consulting Attending: Dr. Phebe Colla, MD  Reason for Consult:  Staghorn calculi  HPI from initial consult note: Adrienne Lane is seen in consultation for reasons noted above at the request of Caren Griffins, MD for evaluation of staghorn calculi.  This is a 53 y.o. female with history of HTN, DM (A1c 11), morbid obesity, new Afib with RVR, new systolic heart failure, new acute kidney injury who presented after hip injury but noted with multiple new diagnosis. Urology was consulted after RUS was obtained in the setting of AKI with report of bilateral staghorn calculi and UA concerning for colonization vs infection.   Patient reports no prior history of stone symptoms or passage. After diagnosed with diabetes was seen by what sounds like a nephrogist (poor historian). Urine testing was "always" concerning for infection and she was "always" on antibiotics "every 2 weeks." However, upon further questioning, she never had dysuria, hematuria, new LUTS. Her baseline mixed urinary incontinence (1 liner per day); did not change after course of antibiotics. But sounds as though post-antibiotic course urine testing never showed resolution and she would undergo another course of antibiotics. When asked, sounds as though urine collection was suboptimal given her body habitus, an not 'clean catch.'  Denies ever having hematuria. Rare UTI with dysuria and urgency/frequency. Feels as though she empties completely.  CT obtained in July 2019 for abdominal lesion did note "large staghorn calculi nearly completely fill the collecting systems bilaterally. Diffuse urothelial thickening throughout both collecting systems. Large uterine mass measuring approximately 15.2 x 18.9 x 18.8 cm." She remember this but was unable to set up urology follow up at that time.   Since admission, she has been  started on Lasix. Cr 1.7 on admission (10/31) -> 2.2 (11/3) while on diuresis today. Electrolytes remain wnl. KUB and RUS with large volume bilateral stone burden. No obvious hydronephrosis. No CVA tenderness or flank pain. Does endorse cutaneous discomfort - although she thinks this is from diuresis.   Denies family history of stones, GI surgery, gout.  Endorses prior excessive use of TUMS, diabetes, morbid obesity.   Objective   Vital signs in last 24 hours: BP 123/83 (BP Location: Left Arm)    Pulse 77    Temp 97.8 F (36.6 C) (Oral)    Resp 20    Ht 5\' 8"  (1.727 m)    Wt (!) 149.7 kg    LMP 10/20/2019 (Approximate)    SpO2 100%    BMI 50.18 kg/m   Physical Exam General: NAD, A&O, resting, appropriate HEENT: [place]/AT, EOMI, MMM Pulmonary: Normal work of breathing Cardiovascular: HDS, adequate peripheral perfusion Abdomen: Morbidly obese. Soft, NTTP, nondistended.  GU: No CVA tenderness bialterally. Voiding spontaneously. Extremities: warm and well perfused Neuro: Appropriate, no focal neurological deficits Skin: Does endorse tender skin over her lateral abdomen and flank but no deep tenderness.   Most Recent Labs: Lab Results  Component Value Date   WBC 5.8 10/24/2019   HGB 9.7 (L) 10/24/2019   HCT 32.3 (L) 10/24/2019   PLT 228 10/24/2019    Lab Results  Component Value Date   NA 139 10/24/2019   K 3.8 10/24/2019   CL 106 10/24/2019   CO2 22 10/24/2019   BUN 46 (H) 10/24/2019   CREATININE 2.39 (H) 10/24/2019   CALCIUM 8.4 (L) 10/24/2019   MG 2.0 10/23/2019   PHOS 3.9 10/24/2019  Ct Renal Stone Study  Addendum Date: 10/23/2019   ADDENDUM REPORT: 10/23/2019 21:29 ADDENDUM: Findings and recommendations were discussed with NP Baltazar Najjar via telephone on 10/23/2019 at 9:29 p.m. Electronically Signed   By: Lovena Le M.D.   On: 10/23/2019 21:29   Result Date: 10/23/2019 CLINICAL DATA:  Renal failure, history of bilateral staghorn calculi with new renal failure EXAM: CT ABDOMEN  AND PELVIS WITHOUT CONTRAST TECHNIQUE: Multidetector CT imaging of the abdomen and pelvis was performed following the standard protocol without IV contrast. COMPARISON:  None. FINDINGS: Lower chest: Regions of mosaic attenuation in the lower lungs may reflect areas of air trapping versus imaging during exhalation. No consolidative opacity. Small right pleural effusion. No left effusion. Marked cardiomegaly with four-chamber enlargement. Coronary artery calcifications are present. No pericardial effusion. Hepatobiliary: No visible or contour deforming liver abnormality is seen. Patient is post cholecystectomy. Slight prominence of the biliary tree likely related to reservoir effect. No calcified intraductal gallstones. Pancreas: Unremarkable. No pancreatic ductal dilatation or surrounding inflammatory changes. Spleen: Normal in size without focal abnormality. Adrenals/Urinary Tract: Adrenal glands are difficult to visualize due to increased attenuation of the mesentery and photon starvation towards the midline abdomen. The kidneys demonstrate extensive bilateral staghorn calculi. No visible hydronephrosis. Difficult to follow the course of either ureter given lack of contrast and fluid in the abdomen. No contour deforming renal lesions are seen. There is circumferential bladder wall thickening. Stomach/Bowel: Distal esophagus, stomach and duodenal sweep are unremarkable. No small bowel dilatation or wall thickening. Nonvisualization of the appendix. No convincing pericecal inflammation. No colonic dilatation or wall thickening. Vascular/Lymphatic: Atherosclerotic plaque within the normal caliber aorta. Assessment of the lymph nodes is limited in the absence of contrast media. Central mesenteric lymph nodes appear slightly engorged and edematous. Reproductive: There is marked enlargement of the uterine fundus measuring up to 17 x 16 x 16 cm in size. There is heterogeneous attenuation of the uterus. A curvilinear air  lucency is noted towards the superior aspect of this masslike enlargement. Difficult to discern the margins of the ovaries. Other: Moderate volume free fluid throughout the abdomen with central mesenteric hazy congestion and edema. Extensive severe body wall edema is seen circumferentially. Fat containing umbilical hernia is noted. No bowel containing hernias. Musculoskeletal: No acute osseous abnormality or suspicious osseous lesion. Multilevel degenerative changes are present in the imaged portions of the spine. Degenerative changes most pronounced at L4-5. 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 IMPRESSION: 1. Imaging quality is significantly degraded due to patient body habitus with resulting central photon starvation. Evaluation is furthermore complicated by lack of intravenous contrast material. 2. Marked enlargement of the uterine fundus measuring up to 17 cm in size, with heterogeneous attenuation and a an irregular curvilinear air lucency towards the superior aspect of this masslike enlargement. 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 Recommend further evaluation with pelvic ultrasound. 3. Extensive bilateral staghorn calculi. No visible hydronephrosis. Difficult to follow the course of either ureter given lack of contrast and fluid extensive fluid in the abdomen. 4. Severe anasarca with extensive body wall edema, mesenteric congestion, ascites, and right pleural effusion. 5. Circumferential thickening of the urinary bladder. Correlate with urinalysis to exclude cystitis. 6. Marked cardiomegaly with four-chamber enlargement. 7. Regions of mosaic attenuation in the lower lungs may reflect areas of air trapping versus imaging during exhalation. 8. Aortic Atherosclerosis (ICD10-I70.0). Electronically Signed: By: Lovena Le M.D. On: 10/23/2019 20:58  ------  INTERVAL: CT scan  with large bilateral staghorn calculi, no appreciable  hydronephrosis  Interestingly, concerning enlargement and appearance of uterus.  She continues to be afebrile without signs of infection. No leukocytosis.  Urine culture with mixed species; of note this was a catheterized specimen. Would not treat based on these findings.  Reports voiding well.  On heparin currently.   Assessment:  53 y.o. female with multiple comorbidities including uncontrolled diabetes, HTN, morbid obesity, new systolic heart failure (EF ~20%), new atrial fibrillation with RVR, new acute kidney injury, and bilateral nephrolithiasis.   Acute Kidney Injury: This is likely multifactorial. Renal atrophy is likely secondary to medical renal disease given her uncontrolled diabetes as well as HTN. It is possible her bilateral stone disease has caused progressive injury, although would not anticipate this over the past year (Cr July 2019 was 1.0; presented at 1.7). Her cardiac status may also be contributing. CT with large bilateral staghorns and renal atrophy, no hydronephrosis. Would not pursue further urinary drainage with stents or PCNs at this time.   Pyruia/Bacteruria: This is in the setting of known bilateral staghorns; expected. She does not have any new urinary symptoms, dysuria, hematuria, or flank pain. Highly unlikely she has an active UTI; urine culture with multiple species. She is likely colonized given staghorn calculi. Would not treat empirically with antibiotics at this time.   Bilateral Nephrolithiasis: CT with bilateral complete staghorn calculi, no prior symptomatic episodes or hospitalizations for UTI/urospesis. Anticipate treatment would require PCNLs bialterally, or pending overall renal function and assessment of parenchyma, may need functional study if nephrectomy were to be considered. After her overall hospital course, she should have urologic follow up to discuss management of bilateral complete staghorn calculi.    Recommendations: - Consider further  evaluation of uterine findings on CT - No urologic indication for further imaging at this time. - No indication to treat mixed positive UClx at this time as she does not have an active UTI (ie: asymptomatic) - No role for urologic intervention at this time.  - Please place ambulatory referral to urology to discuss future management of bilateral staghorn calculi - If concerns for fever or infection, please page urology.  Thank you for this consult. Urology will sign off at this time. Please contact urology with any further questions/concerns.

## 2019-10-24 NOTE — Progress Notes (Signed)
Inpatient Diabetes Program Recommendations  AACE/ADA: New Consensus Statement on Inpatient Glycemic Control (2015)  Target Ranges:  Prepandial:   less than 140 mg/dL      Peak postprandial:   less than 180 mg/dL (1-2 hours)      Critically ill patients:  140 - 180 mg/dL    Results for Adrienne Lane, Adrienne Lane (MRN [id number]) as of 10/24/2019 12:04  Ref. Range 10/24/2019 08:13 10/24/2019 11:55  Glucose-Capillary Latest Ref Range: 70 - 99 mg/dL 191 (H) 226 (H)    Admit with: A Fib with RVR/ New Onset CHF  History: DM, HTN  Home DM Meds: None since December 2019                             Was taking Basaglar 30 units Daily  Current Orders: Novolog Sensitive Correction Scale/ SSI (0-9 units) TID AC + HS      Lantus 12 units Daily    Off meds and NO medical care for HTN and DM since December 2019 when she moved to [facility] from [place] and lost her Medicaid.  Was taking Insulin prior to moving to [place].  Glucose increases after meal intake. Please consider adding Novolog Meal Coverage:  Novolog 2-4 units TID with meals  (Please add the following Hold Parameters: Hold if pt eats <50% of meal, Hold if pt NPO)    [facility] pharmacy have Basaglar and Humalog Insulin pens in stock.  Patient has an appt at the [facility] clinic on 11/06/2019 and will be able to get her Rxs filled over at the [facility] after d/c for low cost.  Please consider the below insulins as these are what they have in stock:  Basaglar insulin pen- Order # [id number] Humalog Insulin pen- Order # [id number] Insulin Pen Needles- Order # [id number] Glucometer- Order # [id number]     --Will follow patient during hospitalization--  Tama Headings RN, MSN, BC-ADM Inpatient Diabetes Coordinator Team Pager [phone] (8a-5p)

## 2019-10-25 DIAGNOSIS — D259 Leiomyoma of uterus, unspecified: Secondary | ICD-10-CM

## 2019-10-25 LAB — HEPARIN LEVEL (UNFRACTIONATED)
Heparin Unfractionated: 0.28 IU/mL — ABNORMAL LOW (ref 0.30–0.70)
Heparin Unfractionated: 0.42 IU/mL (ref 0.30–0.70)

## 2019-10-25 LAB — RENAL FUNCTION PANEL
Albumin: 2.7 g/dL — ABNORMAL LOW (ref 3.5–5.0)
Anion gap: 14 (ref 5–15)
BUN: 49 mg/dL — ABNORMAL HIGH (ref 6–20)
CO2: 20 mmol/L — ABNORMAL LOW (ref 22–32)
Calcium: 8.7 mg/dL — ABNORMAL LOW (ref 8.9–10.3)
Chloride: 103 mmol/L (ref 98–111)
Creatinine, Ser: 2.65 mg/dL — ABNORMAL HIGH (ref 0.44–1.00)
GFR calc Af Amer: 23 mL/min — ABNORMAL LOW (ref >60)
GFR calc non Af Amer: 20 mL/min — ABNORMAL LOW (ref >60)
Glucose, Bld: 213 mg/dL — ABNORMAL HIGH (ref 70–99)
Phosphorus: 4.3 mg/dL (ref 2.5–4.6)
Potassium: 4.3 mmol/L (ref 3.5–5.1)
Sodium: 137 mmol/L (ref 135–145)

## 2019-10-25 LAB — CBC
HCT: 34.3 % — ABNORMAL LOW (ref 36.0–46.0)
Hemoglobin: 10.2 g/dL — ABNORMAL LOW (ref 12.0–15.0)
MCH: 24 pg — ABNORMAL LOW (ref 26.0–34.0)
MCHC: 29.7 g/dL — ABNORMAL LOW (ref 30.0–36.0)
MCV: 80.7 fL (ref 80.0–100.0)
Platelets: 177 10*3/uL (ref 150–400)
RBC: 4.25 MIL/uL (ref 3.87–5.11)
RDW: 19 % — ABNORMAL HIGH (ref 11.5–15.5)
WBC: 5.8 10*3/uL (ref 4.0–10.5)
nRBC: 0.5 % — ABNORMAL HIGH (ref 0.0–0.2)

## 2019-10-25 LAB — CORTISOL: Cortisol, Plasma: 21.6 ug/dL

## 2019-10-25 LAB — GLUCOSE, CAPILLARY
Glucose-Capillary: 198 mg/dL — ABNORMAL HIGH (ref 70–99)
Glucose-Capillary: 266 mg/dL — ABNORMAL HIGH (ref 70–99)
Glucose-Capillary: 288 mg/dL — ABNORMAL HIGH (ref 70–99)
Glucose-Capillary: 274 mg/dL — ABNORMAL HIGH (ref 70–99)

## 2019-10-25 MED ORDER — METOPROLOL SUCCINATE ER 100 MG PO TB24
200.0000 mg | ORAL_TABLET | Freq: Every day | ORAL | Status: DC
  Administered 2019-10-25 – 2019-10-27 (×3): 200 mg via ORAL
  Filled 2019-10-25 (×3): qty 2

## 2019-10-25 MED ORDER — ONDANSETRON HCL 4 MG/2ML IJ SOLN
4.0000 mg | Freq: Four times a day (QID) | INTRAMUSCULAR | Status: DC | PRN
  Administered 2019-10-27: 4 mg via INTRAVENOUS
  Filled 2019-10-25: qty 2

## 2019-10-25 MED ORDER — FUROSEMIDE 10 MG/ML IJ SOLN
160.0000 mg | Freq: Two times a day (BID) | INTRAVENOUS | Status: DC
  Administered 2019-10-25 – 2019-10-27 (×5): 160 mg via INTRAVENOUS
  Filled 2019-10-25 (×4): qty 16
  Filled 2019-10-25 (×2): qty 10

## 2019-10-25 MED ORDER — ALBUMIN HUMAN 5 % IV SOLN
12.5000 g | Freq: Once | INTRAVENOUS | Status: AC
  Administered 2019-10-25: 12.5 g via INTRAVENOUS
  Filled 2019-10-25: qty 250

## 2019-10-25 MED ORDER — ALBUMIN HUMAN 25 % IV SOLN
12.5000 g | Freq: Four times a day (QID) | INTRAVENOUS | Status: AC
  Administered 2019-10-26 (×4): 12.5 g via INTRAVENOUS
  Filled 2019-10-25 (×4): qty 50

## 2019-10-25 MED ORDER — HEPARIN (PORCINE) 25000 UT/250ML-% IV SOLN
1850.0000 [IU]/h | INTRAVENOUS | Status: DC
  Administered 2019-10-25 – 2019-10-27 (×5): 1850 [IU]/h via INTRAVENOUS
  Filled 2019-10-25 (×5): qty 250

## 2019-10-25 NOTE — Consult Note (Addendum)
Reason for Consult:AKI/CKD stage 3 Referring Physician: Samuella Cota, MD  Lesbia Ottaway is an 53 y.o. female.  HPI: Mrs. Iwata has a PMH significant for DM, HTN, obesity, bilateral staghorn calculi, CKD stage 3, atrial fibrillation, who recently moved from [place] presented to [facility] ED on 10/20/19 with right hip pain.  In the ED she was found to be in atrial fibrillation with RVR and rate in the 140's.  Her BNP was 1623 and BUN/Cr of 31/1.76 (Cr had been 1.03 on 09/01/18 at [facility]).  She was also noted to have 3 + edema on exam and was admitted for new onset afib and CHF.  Cardiology was consulted and an ECHO revealed an EF of 20-25% with severe decreased LV function, moderately elevated pulmonary artery systolic pressure.  She was treated with high dose IV lasix without significant diuresis and we were consulted after her BUN/Cr continued to climb during this hospitalization.  She did have CT scan with showed large bilateral staghorn calculi but without hydronephrosis, so Urology signed off.  The trend in Scr is seen below.  Trend in Creatinine: Creatinine, Ser  Date/Time Value Ref Range Status  10/25/2019 04:41 AM 2.65 (H) 0.44 - 1.00 mg/dL Final  10/24/2019 04:10 AM 2.39 (H) 0.44 - 1.00 mg/dL Final  10/23/2019 04:24 AM 2.24 (H) 0.44 - 1.00 mg/dL Final  10/22/2019 05:16 PM 2.08 (H) 0.44 - 1.00 mg/dL Final  10/22/2019 09:25 AM 1.98 (H) 0.44 - 1.00 mg/dL Final  10/21/2019 07:03 AM 1.90 (H) 0.44 - 1.00 mg/dL Final  10/20/2019 07:58 PM 1.76 (H) 0.44 - 1.00 mg/dL Final    PMH:   Past Medical History:  Diagnosis Date  . Diabetes mellitus without complication ([place])   . Hypertension     PSH:   Past Surgical History:  Procedure Laterality Date  . CHOLECYSTECTOMY      Allergies: No Known Allergies  Medications:   Prior to Admission medications   Medication Sig Start Date End Date Taking? Authorizing Provider  Insulin Glargine (BASAGLAR KWIKPEN) 100 UNIT/ML SOPN Inject 30 Units into the skin  every morning.   Yes [provider]  lisinopril (ZESTRIL) 10 MG tablet Take 10 mg by mouth daily.   Yes [provider]  metoprolol succinate (TOPROL-XL) 50 MG 24 hr tablet Take 1 tablet by mouth daily.   Yes [provider]    Inpatient medications: . amiodarone  400 mg Oral BID   Followed by  . [START ON 10/31/2019] amiodarone  200 mg Oral Daily  . insulin aspart  0-5 Units Subcutaneous QHS  . insulin aspart  0-9 Units Subcutaneous TID WC  . insulin glargine  12 Units Subcutaneous Daily  . isosorbide-hydrALAZINE  2 tablet Oral TID  . metoprolol succinate  200 mg Oral Daily    Discontinued Meds:   Medications Discontinued During This Encounter  Medication Reason  . heparin bolus via infusion 5,000 Units Entry Error  . metoprolol tartrate (LOPRESSOR) injection 5 mg   . heparin ADULT infusion 100 units/mL (25000 units/232mL sodium chloride 0.45%)   . heparin ADULT infusion 100 units/mL (25000 units/229mL sodium chloride 0.45%)   . influenza vac split quadrivalent PF (FLUARIX) injection 0.5 mL   . furosemide (LASIX) injection 20 mg   . furosemide (LASIX) injection 60 mg   . furosemide (LASIX) injection 40 mg   . diltiazem (CARDIZEM) injection 5 mg   . metoprolol tartrate (LOPRESSOR) tablet 25 mg   . furosemide (LASIX) injection 80 mg   . heparin  ADULT infusion 100 units/mL (25000 units/232mL sodium chloride 0.45%)   . furosemide (LASIX) 120 mg in dextrose 5 % 50 mL IVPB   . insulin glargine (LANTUS) injection 8 Units   . furosemide (LASIX) 120 mg in dextrose 5 % 50 mL IVPB   . HYDROmorphone (DILAUDID) injection 1 mg   . traMADol (ULTRAM) tablet 50 mg   . heparin ADULT infusion 100 units/mL (25000 units/267mL sodium chloride 0.45%)   . metoprolol tartrate (LOPRESSOR) tablet 50 mg     Social History:  reports that she has never smoked. She has never used smokeless tobacco. She reports that she does not drink alcohol or use drugs.  Family History:    Family History  Problem Relation Age of Onset  . Atrial fibrillation Mother     Pertinent items are noted in HPI. Weight change: 0.9 kg  Intake/Output Summary (Last 24 hours) at 10/25/2019 1229 Last data filed at 10/25/2019 0920 Gross per 24 hour  Intake 1050 ml  Output 500 ml  Net 550 ml   BP 129/82 (BP Location: Right Arm)   Pulse 100   Temp 98.4 F (36.9 C) (Oral)   Resp 20   Ht 5\' 8"  (1.727 m)   Wt (!) 151.4 kg   LMP 10/20/2019 (Approximate)   SpO2 99%   BMI 50.75 kg/m  Vitals:   10/24/19 2048 10/24/19 2234 10/25/19 0500 10/25/19 0508  BP: (!) 183/153 (!) 146/121  129/82  Pulse: 64 73  100  Resp: 20   20  Temp: 97.9 F (36.6 C)   98.4 F (36.9 C)  TempSrc: Oral   Oral  SpO2: 99%   99%  Weight:   (!) 151.4 kg   Height:         General appearance: no distress, morbidly obese and slowed mentation Head: Normocephalic, without obvious abnormality, atraumatic Resp: clear to auscultation bilaterally Cardio: irregularly irregular rhythm and no rub GI: soft, non-tender; bowel sounds normal; no masses,  no organomegaly Extremities: edema 2+ anasaraca to lower abdomen  Labs: Basic Metabolic Panel: Recent Labs  Lab 10/20/19 1958 10/21/19 0703 10/22/19 0925 10/22/19 1716 10/23/19 0424 10/24/19 0410 10/25/19 0441  NA 140 142 138 138 137 139 137  K 3.4* 3.6 3.4* 4.3 4.6 3.8 4.3  CL 105 108 104 106 106 106 103  CO2 24 24 23 23  20* 22 20*  GLUCOSE 355* 180* 226* 251* 221* 229* 213*  BUN 31* 33* 37* 36* 42* 46* 49*  CREATININE 1.76* 1.90* 1.98* 2.08* 2.24* 2.39* 2.65*  ALBUMIN  --   --  2.8*  --   --  2.4* 2.7*  CALCIUM 9.1 8.9 8.8* 8.9 8.6* 8.4* 8.7*  PHOS  --   --   --   --   --  3.9 4.3   Liver Function Tests: Recent Labs  Lab 10/22/19 0925 10/24/19 0410 10/25/19 0441  AST 23  --   --   ALT 22  --   --   ALKPHOS 69  --   --   BILITOT 1.2  --   --   PROT 6.5  --   --   ALBUMIN 2.8* 2.4* 2.7*   No results for input(s): LIPASE, AMYLASE in the last  168 hours. No results for input(s): AMMONIA in the last 168 hours. CBC: Recent Labs  Lab 10/20/19 1958  10/22/19 0014 10/23/19 0424 10/24/19 0410 10/25/19 0441  WBC 6.2   < > 6.7 6.1 5.8 5.8  NEUTROABS 4.3  --   --   --   --   --  HGB 11.9*   < > 10.5* 10.5* 9.7* 10.2*  HCT 39.7   < > 34.9* 34.4* 32.3* 34.3*  MCV 80.5   < > 80.8 79.6* 79.6* 80.7  PLT PLATELET CLUMPS NOTED ON SMEAR, UNABLE TO ESTIMATE   < > 171 197 228 177   < > = values in this interval not displayed.   PT/INR: @LABRCNTIP (inr:5) Cardiac Enzymes: )No results for input(s): CKTOTAL, CKMB, CKMBINDEX, TROPONINI in the last 168 hours. CBG: Recent Labs  Lab 10/24/19 1155 10/24/19 1644 10/24/19 2039 10/24/19 2311 10/25/19 0729  GLUCAP 226* 270* 333* 240* 198*    Iron Studies:  Recent Labs  Lab 10/21/19 0703  IRON 44  TIBC 355  FERRITIN 20    Xrays/Other Studies: Dg Abd Portable 1v  Result Date: 10/23/2019 CLINICAL DATA:  Kidney stone, bilateral abdominal pain. EXAM: PORTABLE ABDOMEN - 1 VIEW COMPARISON:  None. FINDINGS: Body habitus degrades image quality. Large staghorn calculi are seen in the kidneys bilaterally. IMPRESSION: Large bilateral staghorn renal calculi. Electronically Signed   By: Lorin Picket M.D.   On: 10/23/2019 14:33   Ct Renal Stone Study  Addendum Date: 10/23/2019   ADDENDUM REPORT: 10/23/2019 21:29 ADDENDUM: Findings and recommendations were discussed with NP Baltazar Najjar via telephone on 10/23/2019 at 9:29 p.m. Electronically Signed   By: Lovena Le M.D.   On: 10/23/2019 21:29   Result Date: 10/23/2019 CLINICAL DATA:  Renal failure, history of bilateral staghorn calculi with new renal failure EXAM: CT ABDOMEN AND PELVIS WITHOUT CONTRAST TECHNIQUE: Multidetector CT imaging of the abdomen and pelvis was performed following the standard protocol without IV contrast. COMPARISON:  None. FINDINGS: Lower chest: Regions of mosaic attenuation in the lower lungs may reflect areas of air trapping  versus imaging during exhalation. No consolidative opacity. Small right pleural effusion. No left effusion. Marked cardiomegaly with four-chamber enlargement. Coronary artery calcifications are present. No pericardial effusion. Hepatobiliary: No visible or contour deforming liver abnormality is seen. Patient is post cholecystectomy. Slight prominence of the biliary tree likely related to reservoir effect. No calcified intraductal gallstones. Pancreas: Unremarkable. No pancreatic ductal dilatation or surrounding inflammatory changes. Spleen: Normal in size without focal abnormality. Adrenals/Urinary Tract: Adrenal glands are difficult to visualize due to increased attenuation of the mesentery and photon starvation towards the midline abdomen. The kidneys demonstrate extensive bilateral staghorn calculi. No visible hydronephrosis. Difficult to follow the course of either ureter given lack of contrast and fluid in the abdomen. No contour deforming renal lesions are seen. There is circumferential bladder wall thickening. Stomach/Bowel: Distal esophagus, stomach and duodenal sweep are unremarkable. No small bowel dilatation or wall thickening. Nonvisualization of the appendix. No convincing pericecal inflammation. No colonic dilatation or wall thickening. Vascular/Lymphatic: Atherosclerotic plaque within the normal caliber aorta. Assessment of the lymph nodes is limited in the absence of contrast media. Central mesenteric lymph nodes appear slightly engorged and edematous. Reproductive: There is marked enlargement of the uterine fundus measuring up to 17 x 16 x 16 cm in size. There is heterogeneous attenuation of the uterus. A curvilinear air lucency is noted towards the superior aspect of this masslike enlargement. Difficult to discern the margins of the ovaries. Other: Moderate volume free fluid throughout the abdomen with central mesenteric hazy congestion and edema. Extensive severe body wall edema is seen  circumferentially. Fat containing umbilical hernia is noted. No bowel containing hernias. Musculoskeletal: No acute osseous abnormality or suspicious osseous lesion. Multilevel degenerative changes are present in the imaged portions of the spine. Degenerative changes  most pronounced at L4-5. 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 IMPRESSION: 1. Imaging quality is significantly degraded due to patient body habitus with resulting central photon starvation. Evaluation is furthermore complicated by lack of intravenous contrast material. 2. Marked enlargement of the uterine fundus measuring up to 17 cm in size, with heterogeneous attenuation and a an irregular curvilinear air lucency towards the superior aspect of this masslike enlargement. 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 Recommend further evaluation with pelvic ultrasound. 3. Extensive bilateral staghorn calculi. No visible hydronephrosis. Difficult to follow the course of either ureter given lack of contrast and fluid extensive fluid in the abdomen. 4. Severe anasarca with extensive body wall edema, mesenteric congestion, ascites, and right pleural effusion. 5. Circumferential thickening of the urinary bladder. Correlate with urinalysis to exclude cystitis. 6. Marked cardiomegaly with four-chamber enlargement. 7. Regions of mosaic attenuation in the lower lungs may reflect areas of air trapping versus imaging during exhalation. 8. Aortic Atherosclerosis (ICD10-I70.0). Electronically Signed: By: Lovena Le M.D. On: 10/23/2019 20:58   US Pelvic Complete With Transvaginal  Result Date: 10/24/2019 CLINICAL DATA:  Initial evaluation for uterine anomaly. EXAM: TRANSABDOMINAL AND TRANSVAGINAL ULTRASOUND OF PELVIS TECHNIQUE: Both transabdominal and transvaginal ultrasound examinations of the pelvis were performed. Transabdominal technique was performed for global imaging of the pelvis including  uterus, ovaries, adnexal regions, and pelvic cul-de-sac. It was necessary to proceed with endovaginal exam following the transabdominal exam to visualize the uterus, endometrium, and ovaries. COMPARISON:  Prior CT from 10/23/2019. FINDINGS: Uterus Evaluation of the uterus is limited given body habitus in size. Uterus measures up to approximately 16.3 x 15.8 x 15.8 cm. Previously identified large masslike lesion involving the uterus is not well delineated given size, and could reflect a large fibroid or possibly endometrial mass. Endometrium Not definitely visualized. Right ovary Not visualized.  No adnexal mass. Left ovary Not visualized.  No adnexal mass. Other findings Moderate volume free fluid within the pelvis. IMPRESSION: 1. Technically limited exam due to body habitus. 2. Marked enlargement of the uterus, corresponding with abnormality seen on prior CT. Unclear whether this reflects a large uterine fibroid or possibly endometrial mass, not well assessed on this technically limited exam. Given the limitations of this study, further assessment with dedicated pelvic MRI is recommended for further characterization. Given the patient's low GFR, a noncontrast examination could be performed. 3. Nonvisualization of the ovaries.  No other adnexal mass. 4. Moderate volume free fluid within the pelvis. Electronically Signed   By: Jeannine Boga M.D.   On: 10/24/2019 18:48     Assessment/Plan: 1.  AKI/CKD- in setting of large, bilateral staghorn calculi, anasarca, atrial fibrillation with RVR, and new systolic CHF.  Her lasix was held due to rising Scr and we were consulted to help assist with diuresis.  She denies an history of CKD but knew about her calculi.  She also has hypoalbuminemia, anemia, and proteinuria.  Will start GN workup and quantify proteinuria. 2. New onset, acute systolic CHF with anasarca- CArdiology following.  Will resume IV lasix and add IV albumin to help assist with diuresis.  Need to  follow renal US to make sure she doesn't develop hydronephrosis from her bilateral staghorn calculi. 3. Atrial fibrillation- now rate controlled with amiodarone.  Currently on heparin drip 4. Bilateral staghorn calculi- evaluated by Urology.  No evidence of hydronephrosis so no indication for PNT 5. DM- poorly controlled with Hgb A1c 11.7%.  per primary 6.  Right hip pain s/p mechanical fall- no fx 7. Anemia- normocytic. Low iron will replete and follow 8. Uterine mass- per primary   Broadus John A Cedar Ditullio 10/25/2019, 12:29 PM

## 2019-10-25 NOTE — Progress Notes (Signed)
Cardiology Progress Note  Patient ID: Adrienne Lane MRN: [id number] DOB: May 06, 1966 Date of Encounter: 10/25/2019  Primary Cardiologist: Donato Heinz, MD  Subjective  Creatinine continue to rise.  We have given her a break from diuresis.  She had very suboptimal urine output even with diuresis.  Heart rates appear to be back in sinus with PVCs.  ROS:  All other ROS reviewed and negative. Pertinent positives noted in the HPI.     Inpatient Medications  Scheduled Meds: . amiodarone  400 mg Oral BID   Followed by  . [START ON 10/31/2019] amiodarone  200 mg Oral Daily  . insulin aspart  0-5 Units Subcutaneous QHS  . insulin aspart  0-9 Units Subcutaneous TID WC  . insulin glargine  12 Units Subcutaneous Daily  . isosorbide-hydrALAZINE  2 tablet Oral TID  . metoprolol tartrate  50 mg Oral Q6H   Continuous Infusions: . heparin 1,850 Units/hr (10/25/19 1000)   PRN Meds: acetaminophen **OR** acetaminophen   Vital Signs   Vitals:   10/24/19 2048 10/24/19 2234 10/25/19 0500 10/25/19 0508  BP: (!) 183/153 (!) 146/121  129/82  Pulse: 64 73  100  Resp: 20   20  Temp: 97.9 F (36.6 C)   98.4 F (36.9 C)  TempSrc: Oral   Oral  SpO2: 99%   99%  Weight:   (!) 151.4 kg   Height:        Intake/Output Summary (Last 24 hours) at 10/25/2019 1109 Last data filed at 10/25/2019 0920 Gross per 24 hour  Intake 1050 ml  Output 500 ml  Net 550 ml   Last 3 Weights 10/25/2019 10/24/2019 10/24/2019  Weight (lbs) 333 lb 12.4 oz 330 lb 0.5 oz 331 lb 12.7 oz  Weight (kg) 151.4 kg 149.7 kg 150.5 kg      Telemetry  Overnight telemetry shows normal sinus rhythm with frequent PVCs.  ECG  The most recent ECG shows sinus rhythm with paroxysmal A. fib and PVCs, which I personally reviewed.   Physical Exam   Vitals:   10/24/19 2048 10/24/19 2234 10/25/19 0500 10/25/19 0508  BP: (!) 183/153 (!) 146/121  129/82  Pulse: 64 73  100  Resp: 20   20  Temp: 97.9 F (36.6 C)   98.4 F (36.9  C)  TempSrc: Oral   Oral  SpO2: 99%   99%  Weight:   (!) 151.4 kg   Height:         Intake/Output Summary (Last 24 hours) at 10/25/2019 1109 Last data filed at 10/25/2019 0920 Gross per 24 hour  Intake 1050 ml  Output 500 ml  Net 550 ml    Last 3 Weights 10/25/2019 10/24/2019 10/24/2019  Weight (lbs) 333 lb 12.4 oz 330 lb 0.5 oz 331 lb 12.7 oz  Weight (kg) 151.4 kg 149.7 kg 150.5 kg    Body mass index is 50.75 kg/m.  General: Well nourished, well developed, in no acute distress Head: Atraumatic, normal size  Eyes: PEERLA, EOMI  Neck: Supple, JVD noted up to earlobes around 15 to 18 cm of water Endocrine: No thryomegaly Cardiac: Irregular rhythm Lungs: Crackles at lung bases Abd: Soft, nontender, no hepatomegaly  Ext: 2+ pitting edema Musculoskeletal: No deformities, BUE and BLE strength normal and equal Skin: Warm and dry, no rashes   Neuro: Alert and oriented to person, place, time, and situation, CNII-XII grossly intact, no focal deficits  Psych: Normal mood and affect   Labs  High Sensitivity Troponin:   Recent  Labs  Lab 10/20/19 2028 10/20/19 2245  TROPONINIHS 103* 83*     Cardiac EnzymesNo results for input(s): TROPONINI in the last 168 hours. No results for input(s): TROPIPOC in the last 168 hours.  Chemistry Recent Labs  Lab 10/22/19 0925  10/23/19 0424 10/24/19 0410 10/25/19 0441  NA 138   < > 137 139 137  K 3.4*   < > 4.6 3.8 4.3  CL 104   < > 106 106 103  CO2 23   < > 20* 22 20*  GLUCOSE 226*   < > 221* 229* 213*  BUN 37*   < > 42* 46* 49*  CREATININE 1.98*   < > 2.24* 2.39* 2.65*  CALCIUM 8.8*   < > 8.6* 8.4* 8.7*  PROT 6.5  --   --   --   --   ALBUMIN 2.8*  --   --  2.4* 2.7*  AST 23  --   --   --   --   ALT 22  --   --   --   --   ALKPHOS 69  --   --   --   --   BILITOT 1.2  --   --   --   --   GFRNONAA 29*   < > 25* 23* 20*  GFRAA 33*   < > 28* 26* 23*  ANIONGAP 11   < > 11 11 14    < > = values in this interval not displayed.    Hematology  Recent Labs  Lab 10/23/19 0424 10/24/19 0410 10/25/19 0441  WBC 6.1 5.8 5.8  RBC 4.32 4.06 4.25  HGB 10.5* 9.7* 10.2*  HCT 34.4* 32.3* 34.3*  MCV 79.6* 79.6* 80.7  MCH 24.3* 23.9* 24.0*  MCHC 30.5 30.0 29.7*  RDW 18.8* 18.7* 19.0*  PLT 197 228 177   BNP Recent Labs  Lab 10/20/19 2029  BNP 1,623.1*    DDimer No results for input(s): DDIMER in the last 168 hours.   Radiology  Dg Abd Portable 1v  Result Date: 10/23/2019 CLINICAL DATA:  Kidney stone, bilateral abdominal pain. EXAM: PORTABLE ABDOMEN - 1 VIEW COMPARISON:  None. FINDINGS: Body habitus degrades image quality. Large staghorn calculi are seen in the kidneys bilaterally. IMPRESSION: Large bilateral staghorn renal calculi. Electronically Signed   By: Lorin Picket M.D.   On: 10/23/2019 14:33   Ct Renal Stone Study  Addendum Date: 10/23/2019   ADDENDUM REPORT: 10/23/2019 21:29 ADDENDUM: Findings and recommendations were discussed with NP Baltazar Najjar via telephone on 10/23/2019 at 9:29 p.m. Electronically Signed   By: Lovena Le M.D.   On: 10/23/2019 21:29   Result Date: 10/23/2019 CLINICAL DATA:  Renal failure, history of bilateral staghorn calculi with new renal failure EXAM: CT ABDOMEN AND PELVIS WITHOUT CONTRAST TECHNIQUE: Multidetector CT imaging of the abdomen and pelvis was performed following the standard protocol without IV contrast. COMPARISON:  None. FINDINGS: Lower chest: Regions of mosaic attenuation in the lower lungs may reflect areas of air trapping versus imaging during exhalation. No consolidative opacity. Small right pleural effusion. No left effusion. Marked cardiomegaly with four-chamber enlargement. Coronary artery calcifications are present. No pericardial effusion. Hepatobiliary: No visible or contour deforming liver abnormality is seen. Patient is post cholecystectomy. Slight prominence of the biliary tree likely related to reservoir effect. No calcified intraductal gallstones. Pancreas: Unremarkable. No  pancreatic ductal dilatation or surrounding inflammatory changes. Spleen: Normal in size without focal abnormality. Adrenals/Urinary Tract: Adrenal glands are difficult to visualize  due to increased attenuation of the mesentery and photon starvation towards the midline abdomen. The kidneys demonstrate extensive bilateral staghorn calculi. No visible hydronephrosis. Difficult to follow the course of either ureter given lack of contrast and fluid in the abdomen. No contour deforming renal lesions are seen. There is circumferential bladder wall thickening. Stomach/Bowel: Distal esophagus, stomach and duodenal sweep are unremarkable. No small bowel dilatation or wall thickening. Nonvisualization of the appendix. No convincing pericecal inflammation. No colonic dilatation or wall thickening. Vascular/Lymphatic: Atherosclerotic plaque within the normal caliber aorta. Assessment of the lymph nodes is limited in the absence of contrast media. Central mesenteric lymph nodes appear slightly engorged and edematous. Reproductive: There is marked enlargement of the uterine fundus measuring up to 17 x 16 x 16 cm in size. There is heterogeneous attenuation of the uterus. A curvilinear air lucency is noted towards the superior aspect of this masslike enlargement. Difficult to discern the margins of the ovaries. Other: Moderate volume free fluid throughout the abdomen with central mesenteric hazy congestion and edema. Extensive severe body wall edema is seen circumferentially. Fat containing umbilical hernia is noted. No bowel containing hernias. Musculoskeletal: No acute osseous abnormality or suspicious osseous lesion. Multilevel degenerative changes are present in the imaged portions of the spine. Degenerative changes most pronounced at L4-5. 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 IMPRESSION: 1. Imaging quality is significantly degraded due to patient body habitus with  resulting central photon starvation. Evaluation is furthermore complicated by lack of intravenous contrast material. 2. Marked enlargement of the uterine fundus measuring up to 17 cm in size, with heterogeneous attenuation and a an irregular curvilinear air lucency towards the superior aspect of this masslike enlargement. 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 Recommend further evaluation with pelvic ultrasound. 3. Extensive bilateral staghorn calculi. No visible hydronephrosis. Difficult to follow the course of either ureter given lack of contrast and fluid extensive fluid in the abdomen. 4. Severe anasarca with extensive body wall edema, mesenteric congestion, ascites, and right pleural effusion. 5. Circumferential thickening of the urinary bladder. Correlate with urinalysis to exclude cystitis. 6. Marked cardiomegaly with four-chamber enlargement. 7. Regions of mosaic attenuation in the lower lungs may reflect areas of air trapping versus imaging during exhalation. 8. Aortic Atherosclerosis (ICD10-I70.0). Electronically Signed: By: Lovena Le M.D. On: 10/23/2019 20:58   US Pelvic Complete With Transvaginal  Result Date: 10/24/2019 CLINICAL DATA:  Initial evaluation for uterine anomaly. EXAM: TRANSABDOMINAL AND TRANSVAGINAL ULTRASOUND OF PELVIS TECHNIQUE: Both transabdominal and transvaginal ultrasound examinations of the pelvis were performed. Transabdominal technique was performed for global imaging of the pelvis including uterus, ovaries, adnexal regions, and pelvic cul-de-sac. It was necessary to proceed with endovaginal exam following the transabdominal exam to visualize the uterus, endometrium, and ovaries. COMPARISON:  Prior CT from 10/23/2019. FINDINGS: Uterus Evaluation of the uterus is limited given body habitus in size. Uterus measures up to approximately 16.3 x 15.8 x 15.8 cm. Previously identified large masslike lesion involving the uterus is not well delineated given size, and could  reflect a large fibroid or possibly endometrial mass. Endometrium Not definitely visualized. Right ovary Not visualized.  No adnexal mass. Left ovary Not visualized.  No adnexal mass. Other findings Moderate volume free fluid within the pelvis. IMPRESSION: 1. Technically limited exam due to body habitus. 2. Marked enlargement of the uterus, corresponding with abnormality seen on prior CT. Unclear whether this reflects a large uterine fibroid or possibly endometrial mass, not well assessed  on this technically limited exam. Given the limitations of this study, further assessment with dedicated pelvic MRI is recommended for further characterization. Given the patient's low GFR, a noncontrast examination could be performed. 3. Nonvisualization of the ovaries.  No other adnexal mass. 4. Moderate volume free fluid within the pelvis. Electronically Signed   By: Jeannine Boga M.D.   On: 10/24/2019 18:48    Cardiac Studies  TTE 10/21/2019 1. Left ventricular ejection fraction, by visual estimation, is 20 to 25%. The left ventricle has severely decreased function. There is mildly increased left ventricular hypertrophy.  2. Left ventricular diastolic parameters are indeterminate.  3. The left ventricle demonstrates global hypokinesis.  4. Global right ventricle has normal systolic function.The right ventricular size is mildly enlarged. No increase in right ventricular wall thickness.  5. Left atrial size was moderately dilated.  6. Right atrial size was moderately dilated.  7. The pericardial effusion is circumferential.  8. Trivial pericardial effusion is present.  9. The mitral valve is normal in structure. Mild to moderate mitral valve regurgitation. No evidence of mitral stenosis. 10. The tricuspid valve is normal in structure. Tricuspid valve regurgitation moderate-severe. 11. The aortic valve is normal in structure. Aortic valve regurgitation is not visualized. No evidence of aortic valve sclerosis  or stenosis. 12. The pulmonic valve was normal in structure. Pulmonic valve regurgitation is not visualized. 13. Moderately elevated pulmonary artery systolic pressure. 14. The tricuspid regurgitant velocity is 3.29 m/s, and with an assumed right atrial pressure of 15 mmHg, the estimated right ventricular systolic pressure is moderately elevated at 58.3 mmHg. 15. The inferior vena cava is dilated in size with <50% respiratory variability, suggesting right atrial pressure of 15 mmHg.  CT A/P  1. Imaging quality is significantly degraded due to patient body habitus with resulting central photon starvation. Evaluation is furthermore complicated by lack of intravenous contrast material. 2. Marked enlargement of the uterine fundus measuring up to 17 cm in size, with heterogeneous attenuation and a an irregular curvilinear air lucency towards the superior aspect of this masslike enlargement. 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 Recommend further evaluation with pelvic ultrasound. 3. Extensive bilateral staghorn calculi. No visible hydronephrosis. Difficult to follow the course of either ureter given lack of contrast and fluid extensive fluid in the abdomen. 4. Severe anasarca with extensive body wall edema, mesenteric congestion, ascites, and right pleural effusion. 5. Circumferential thickening of the urinary bladder. Correlate with urinalysis to exclude cystitis. 6. Marked cardiomegaly with four-chamber enlargement. 7. Regions of mosaic attenuation in the lower lungs may reflect areas of air trapping versus imaging during exhalation. 8. Aortic Atherosclerosis (ICD10-I70.0).   Patient Profile  Naveena Eyman a 53 y.o.femalewith history of diabetes(A1c 11.1),CKD, obesity who was admitted on 11/1 for new onset systolic heart failure as well as atrial fibrillation with RVR. This is a new diagnosis for her.  Assessment & Plan  1.New onset systolic heart failure,  ejection fraction 20 to 25%, with gross volume overload -Unclear etiology at this point, but longstanding history of diabetes/hypertension.  She does have atrial fibrillation, which can cause a cardiomyopathy.  However this is unclear what came first. -EKG without any acute ischemic changes however she will need an ischemia evaluation prior to discharge -Urine output has been suboptimal with high-dose diuretic therapy.  I am concerned given the staghorn calculi as well as uterine mass that there is any difficulty with diuresing her.  I also am concerned that she has pre-existing CKD.  We need nephrology  to evaluate her to help assist with this.  She may ultimately need dialysis for volume removal and we would appreciate their guidance for this.  I am currently holding diuretic therapy for now. -She is mainly normal sinus rhythm; I will switch her over to metoprolol succinate 200 mg daily -We will also continue BiDil 20-37.5 mg 2 tablets 3 times daily -No ACE/ARB given AKI -Would ultimately like to perform a cardiac cath to ensure that she has no significant CAD.  I think a right heart cath will also improve.  Given that she has significant AKI, we are holding on any cardiac at this point.  2.Atrial fibrillation with RVR, paroxysmal -TSH 4.6, but T4 is within normal limits -Unclear if this is the cause of her cardiomyopathy or if she developed heart failure first and then A. Fib -She is mainly in sinus rhythm today.  We will transition her to metoprolol succinate 20 mg daily.  She will continue with oral load as below (400 mg twice daily for 7 days and then 200 mg daily for 21 days and then stop) -We will continue heparin drip for now as I expect she may end up needing a dialysis catheter or other procedures given the uterine mass.  We will transition to a DOAC when we are able to.  I also would like to at some point consider cardiac catheterization however her kidney function precludes this currently    3. AKI -Staghorn renal calculi noted as well as uterine mass -Nephrology will weigh in today on this and gynecology is working up the mass to determine how much this is impacting her kidney function  For questions or updates, please contact [place] Please consult [email or web address] for contact info under   Signed, [place] Bells T. Audie Box, [place]  10/25/2019 11:09 AM

## 2019-10-25 NOTE — Progress Notes (Addendum)
Inpatient Diabetes Program Recommendations  AACE/ADA: New Consensus Statement on Inpatient Glycemic Control (2015)  Target Ranges:  Prepandial:   less than 140 mg/dL      Peak postprandial:   less than 180 mg/dL (1-2 hours)      Critically ill patients:  140 - 180 mg/dL   Results for CARNELLA, FRYMAN (MRN [id number]) as of 10/25/2019 09:33  Ref. Range 10/24/2019 08:13 10/24/2019 11:55 10/24/2019 16:44 10/24/2019 20:39 10/24/2019 23:11 10/25/2019 07:29  Glucose-Capillary Latest Ref Range: 70 - 99 mg/dL 191 (H) 226 (H) 270 (H) 333 (H) 240 (H) 198 (H)    Admit with: A Fib with RVR/ New Onset CHF  History: DM, HTN  Home DM Meds: None since December 2019                             Was taking Basaglar 30 units Daily  Current Orders: Novolog Sensitive Correction Scale/ SSI (0-9 units) TID AC + HS      Lantus 12 units Daily    Off meds and NO medical care for HTN and DM since December 2019 when she moved to [facility] from [place] and lost her Medicaid.  Was taking Insulin prior to moving to [place].  Fasting glucose still elevated in the 190's. Glucose increases after meal intake. Please consider:  -  Increase Lantus to 14 units  -  Novolog 2-4 units TID for meal coverage  (Please add the following Hold Parameters: Hold if pt eats <50% of meal, Hold if pt NPO)    [facility] pharmacy have Basaglar and Humalog Insulin pens in stock.  Patient has an appt at the [facility] clinic on 11/06/2019 and will be able to get her Rxs filled over at the [facility] after d/c for low cost.  Please consider the below insulins as these are what they have in stock:  Basaglar insulin pen- Order # [id number] Humalog Insulin pen- Order # [id number] Insulin Pen Needles- Order # [id number] Glucometer- Order # [id number]    --Will follow patient during hospitalization--  Tama Headings RN, MSN, BC-ADM Inpatient Diabetes Coordinator Team Pager [phone] (8a-5p)

## 2019-10-25 NOTE — Consult Note (Addendum)
Reason for Consult: uterine mass on CT scan Referring Physician: Geralynn Rile, MD  Adrienne Lane is an 53 y.o. female. No obstetric history on file. Patient's last menstrual period was 10/20/2019 (approximate). G0P0. Consult was requested to assess uterine mass that was noted on CT scan 11/3. Patient had a diagnosis of fibroid uterus when a CT was done 08/2018 at [facility]. She did not follow with gyn as recommended due to lack of insurance. Her menses are regular and not heavy or painful.  Pertinent Gynecological History: Menses: flow is moderate Bleeding: menstrual, monthly, lasts 3 days Contraception: none Sexually transmitted diseases: no past history Previous GYN Procedures: none  Last pap: normal Date: unsure OB History: G0, P0   Menstrual History: Patient's last menstrual period was 10/20/2019 (approximate).    Past Medical History:  Diagnosis Date  . Diabetes mellitus without complication ([place])   . Hypertension     Past Surgical History:  Procedure Laterality Date  . CHOLECYSTECTOMY      Family History  Problem Relation Age of Onset  . Atrial fibrillation Mother     Social History:  reports that she has never smoked. She has never used smokeless tobacco. She reports that she does not drink alcohol or use drugs.  Allergies: No Known Allergies  Medications: I have reviewed the patient's current medications.  Review of Systems  Constitutional: Positive for malaise/fatigue.  Respiratory: Negative for cough.   Cardiovascular: Negative for chest pain.  Gastrointestinal: Negative.   Genitourinary: Negative for hematuria.    Blood pressure (!) 138/94, pulse 77, temperature 97.7 F (36.5 C), temperature source Oral, resp. rate 18, height 5\' 8"  (1.727 m), weight (!) 151.4 kg, last menstrual period 10/20/2019, SpO2 98 %. Physical Exam  Nursing note and vitals reviewed. Constitutional: She is oriented to person, place, and time. She appears well-developed. No  distress.  obese  Cardiovascular: Normal rate.  Respiratory: Effort normal.  GI: There is no abdominal tenderness.  Morbidly obese, large pannus, no palpable mass or tenderness  Neurological: She is alert and oriented to person, place, and time.  Skin: Skin is warm and dry.  Psychiatric: She has a normal mood and affect. Her behavior is normal.    Results for orders placed or performed during the hospital encounter of 10/20/19 (from the past 48 hour(s))  Glucose, capillary     Status: Abnormal   Collection Time: 10/23/19  4:42 PM  Result Value Ref Range   Glucose-Capillary 222 (H) 70 - 99 mg/dL  Glucose, capillary     Status: Abnormal   Collection Time: 10/23/19  8:08 PM  Result Value Ref Range   Glucose-Capillary 247 (H) 70 - 99 mg/dL  CBC     Status: Abnormal   Collection Time: 10/24/19  4:10 AM  Result Value Ref Range   WBC 5.8 4.0 - 10.5 K/uL   RBC 4.06 3.87 - 5.11 MIL/uL   Hemoglobin 9.7 (L) 12.0 - 15.0 g/dL   HCT 32.3 (L) 36.0 - 46.0 %   MCV 79.6 (L) 80.0 - 100.0 fL   MCH 23.9 (L) 26.0 - 34.0 pg   MCHC 30.0 30.0 - 36.0 g/dL   RDW 18.7 (H) 11.5 - 15.5 %   Platelets 228 150 - 400 K/uL   nRBC 0.0 0.0 - 0.2 %    Comment: Performed at [facility], [place] [address], [place], [place] [address]  Heparin level (unfractionated)     Status: None   Collection Time: 10/24/19  4:10 AM  Result Value Ref Range   Heparin Unfractionated 0.44 0.30 - 0.70 IU/mL    Comment: (NOTE) If heparin results are below expected values, and patient dosage has  been confirmed, suggest follow up testing of antithrombin III levels. Performed at [facility], [place] [address], [place], [place] [address]   Renal function panel     Status: Abnormal   Collection Time: 10/24/19  4:10 AM  Result Value Ref Range   Sodium 139 135 - 145 mmol/L   Potassium 3.8 3.5 - 5.1 mmol/L    Comment: REPEATED TO VERIFY DELTA CHECK NOTED    Chloride 106 98 - 111 mmol/L   CO2 22 22  - 32 mmol/L   Glucose, Bld 229 (H) 70 - 99 mg/dL   BUN 46 (H) 6 - 20 mg/dL   Creatinine, Ser 2.39 (H) 0.44 - 1.00 mg/dL   Calcium 8.4 (L) 8.9 - 10.3 mg/dL   Phosphorus 3.9 2.5 - 4.6 mg/dL   Albumin 2.4 (L) 3.5 - 5.0 g/dL   GFR calc non Af Amer 23 (L) >60 mL/min   GFR calc Af Amer 26 (L) >60 mL/min   Anion gap 11 5 - 15    Comment: Performed at [facility], [place] [address] [place], [place] [address]  Glucose, capillary     Status: Abnormal   Collection Time: 10/24/19  8:13 AM  Result Value Ref Range   Glucose-Capillary 191 (H) 70 - 99 mg/dL  Glucose, capillary     Status: Abnormal   Collection Time: 10/24/19 11:55 AM  Result Value Ref Range   Glucose-Capillary 226 (H) 70 - 99 mg/dL   Comment 1 Notify RN   Glucose, capillary     Status: Abnormal   Collection Time: 10/24/19  4:44 PM  Result Value Ref Range   Glucose-Capillary 270 (H) 70 - 99 mg/dL   Comment 1 Notify RN   Glucose, capillary     Status: Abnormal   Collection Time: 10/24/19  8:39 PM  Result Value Ref Range   Glucose-Capillary 333 (H) 70 - 99 mg/dL  Glucose, capillary     Status: Abnormal   Collection Time: 10/24/19 11:11 PM  Result Value Ref Range   Glucose-Capillary 240 (H) 70 - 99 mg/dL  CBC     Status: Abnormal   Collection Time: 10/25/19  4:41 AM  Result Value Ref Range   WBC 5.8 4.0 - 10.5 K/uL   RBC 4.25 3.87 - 5.11 MIL/uL   Hemoglobin 10.2 (L) 12.0 - 15.0 g/dL   HCT 34.3 (L) 36.0 - 46.0 %   MCV 80.7 80.0 - 100.0 fL   MCH 24.0 (L) 26.0 - 34.0 pg   MCHC 29.7 (L) 30.0 - 36.0 g/dL   RDW 19.0 (H) 11.5 - 15.5 %   Platelets 177 150 - 400 K/uL    Comment: REPEATED TO VERIFY PLATELET COUNT CONFIRMED BY SMEAR SPECIMEN CHECKED FOR CLOTS    nRBC 0.5 (H) 0.0 - 0.2 %    Comment: Performed at [facility], [place] [address], [place], [place] [address]  Heparin level (unfractionated)     Status: Abnormal   Collection Time: 10/25/19  4:41 AM  Result Value Ref Range   Heparin  Unfractionated 0.28 (L) 0.30 - 0.70 IU/mL    Comment: (NOTE) If heparin results are below expected values, and patient dosage has  been confirmed, suggest follow up testing of antithrombin III levels. Performed at [facility], [place]  [address] [place], [place] [address]   Renal function panel     Status: Abnormal   Collection Time: 10/25/19  4:41 AM  Result Value Ref Range   Sodium 137 135 - 145 mmol/L   Potassium 4.3 3.5 - 5.1 mmol/L   Chloride 103 98 - 111 mmol/L   CO2 20 (L) 22 - 32 mmol/L   Glucose, Bld 213 (H) 70 - 99 mg/dL   BUN 49 (H) 6 - 20 mg/dL   Creatinine, Ser 2.65 (H) 0.44 - 1.00 mg/dL   Calcium 8.7 (L) 8.9 - 10.3 mg/dL   Phosphorus 4.3 2.5 - 4.6 mg/dL   Albumin 2.7 (L) 3.5 - 5.0 g/dL   GFR calc non Af Amer 20 (L) >60 mL/min   GFR calc Af Amer 23 (L) >60 mL/min   Anion gap 14 5 - 15    Comment: Performed at [facility], [place] [address], [place], [place] [address]  Glucose, capillary     Status: Abnormal   Collection Time: 10/25/19  7:29 AM  Result Value Ref Range   Glucose-Capillary 198 (H) 70 - 99 mg/dL  Glucose, capillary     Status: Abnormal   Collection Time: 10/25/19 12:31 PM  Result Value Ref Range   Glucose-Capillary 266 (H) 70 - 99 mg/dL  Cortisol     Status: None   Collection Time: 10/25/19  1:45 PM  Result Value Ref Range   Cortisol, Plasma 21.6 ug/dL    Comment: (NOTE) AM    6.7 - 22.6 ug/dL PM   <10.0       ug/dL Performed at [place] [address] [place], [place] [address]   Glucose, capillary     Status: Abnormal   Collection Time: 10/25/19  3:57 PM  Result Value Ref Range   Glucose-Capillary 288 (H) 70 - 99 mg/dL    Ct Renal Stone Study  Addendum Date: 10/23/2019   ADDENDUM REPORT: 10/23/2019 21:29 ADDENDUM: Findings and recommendations were discussed with NP Baltazar Najjar via telephone on 10/23/2019 at 9:29 p.m. Electronically Signed   By: Lovena Le M.D.   On: 10/23/2019 21:29   Result  Date: 10/23/2019 CLINICAL DATA:  Renal failure, history of bilateral staghorn calculi with new renal failure EXAM: CT ABDOMEN AND PELVIS WITHOUT CONTRAST TECHNIQUE: Multidetector CT imaging of the abdomen and pelvis was performed following the standard protocol without IV contrast. COMPARISON:  None. FINDINGS: Lower chest: Regions of mosaic attenuation in the lower lungs may reflect areas of air trapping versus imaging during exhalation. No consolidative opacity. Small right pleural effusion. No left effusion. Marked cardiomegaly with four-chamber enlargement. Coronary artery calcifications are present. No pericardial effusion. Hepatobiliary: No visible or contour deforming liver abnormality is seen. Patient is post cholecystectomy. Slight prominence of the biliary tree likely related to reservoir effect. No calcified intraductal gallstones. Pancreas: Unremarkable. No pancreatic ductal dilatation or surrounding inflammatory changes. Spleen: Normal in size without focal abnormality. Adrenals/Urinary Tract: Adrenal glands are difficult to visualize due to increased attenuation of the mesentery and photon starvation towards the midline abdomen. The kidneys demonstrate extensive bilateral staghorn calculi. No visible hydronephrosis. Difficult to follow the course of either ureter given lack of contrast and fluid in the abdomen. No contour deforming renal lesions are seen. There is circumferential bladder wall thickening. Stomach/Bowel: Distal esophagus, stomach and duodenal sweep are unremarkable. No small bowel dilatation or wall thickening. Nonvisualization of the appendix. No convincing pericecal inflammation. No colonic dilatation or wall thickening. Vascular/Lymphatic: Atherosclerotic plaque within  the normal caliber aorta. Assessment of the lymph nodes is limited in the absence of contrast media. Central mesenteric lymph nodes appear slightly engorged and edematous. Reproductive: There is marked enlargement of the  uterine fundus measuring up to 17 x 16 x 16 cm in size. There is heterogeneous attenuation of the uterus. A curvilinear air lucency is noted towards the superior aspect of this masslike enlargement. Difficult to discern the margins of the ovaries. Other: Moderate volume free fluid throughout the abdomen with central mesenteric hazy congestion and edema. Extensive severe body wall edema is seen circumferentially. Fat containing umbilical hernia is noted. No bowel containing hernias. Musculoskeletal: No acute osseous abnormality or suspicious osseous lesion. Multilevel degenerative changes are present in the imaged portions of the spine. Degenerative changes most pronounced at L4-5. 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 IMPRESSION: 1. Imaging quality is significantly degraded due to patient body habitus with resulting central photon starvation. Evaluation is furthermore complicated by lack of intravenous contrast material. 2. Marked enlargement of the uterine fundus measuring up to 17 cm in size, with heterogeneous attenuation and a an irregular curvilinear air lucency towards the superior aspect of this masslike enlargement. 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 Recommend further evaluation with pelvic ultrasound. 3. Extensive bilateral staghorn calculi. No visible hydronephrosis. Difficult to follow the course of either ureter given lack of contrast and fluid extensive fluid in the abdomen. 4. Severe anasarca with extensive body wall edema, mesenteric congestion, ascites, and right pleural effusion. 5. Circumferential thickening of the urinary bladder. Correlate with urinalysis to exclude cystitis. 6. Marked cardiomegaly with four-chamber enlargement. 7. Regions of mosaic attenuation in the lower lungs may reflect areas of air trapping versus imaging during exhalation. 8. Aortic Atherosclerosis (ICD10-I70.0). Electronically Signed: By: Lovena Le  M.D. On: 10/23/2019 20:58   US Pelvic Complete With Transvaginal  Result Date: 10/24/2019 CLINICAL DATA:  Initial evaluation for uterine anomaly. EXAM: TRANSABDOMINAL AND TRANSVAGINAL ULTRASOUND OF PELVIS TECHNIQUE: Both transabdominal and transvaginal ultrasound examinations of the pelvis were performed. Transabdominal technique was performed for global imaging of the pelvis including uterus, ovaries, adnexal regions, and pelvic cul-de-sac. It was necessary to proceed with endovaginal exam following the transabdominal exam to visualize the uterus, endometrium, and ovaries. COMPARISON:  Prior CT from 10/23/2019. FINDINGS: Uterus Evaluation of the uterus is limited given body habitus in size. Uterus measures up to approximately 16.3 x 15.8 x 15.8 cm. Previously identified large masslike lesion involving the uterus is not well delineated given size, and could reflect a large fibroid or possibly endometrial mass. Endometrium Not definitely visualized. Right ovary Not visualized.  No adnexal mass. Left ovary Not visualized.  No adnexal mass. Other findings Moderate volume free fluid within the pelvis. IMPRESSION: 1. Technically limited exam due to body habitus. 2. Marked enlargement of the uterus, corresponding with abnormality seen on prior CT. Unclear whether this reflects a large uterine fibroid or possibly endometrial mass, not well assessed on this technically limited exam. Given the limitations of this study, further assessment with dedicated pelvic MRI is recommended for further characterization. Given the patient's low GFR, a noncontrast examination could be performed. 3. Nonvisualization of the ovaries.  No other adnexal mass. 4. Moderate volume free fluid within the pelvis. Electronically Signed   By: Jeannine Boga M.D.   On: 10/24/2019 18:48   Result Impression   1. No acute findings within the abdomen or pelvis. 2. Small fat-containing umbilical hernia. The previously noted  water density  structure in this region has essentially resolved. 3. Large degenerated fibroid uterus. 4. Similar appearance of staghorn calculi occupying the majority of the bilateral renal collecting systems. 5. Additional findings as detailed in the body of the report.  Result Narrative  CT ABDOMEN PELVIS W CONTRAST (ROUTINE), 09/06/2018 7:38 PM  INDICATION:abdominal pain \ F79.02 Periumbilical abdominal pain  COMPARISON: CT abdomen pelvis from 07/12/2018  TECHNIQUE: Multislice axial images were obtained through the abdomen and pelvis with administration of iodinated intravenous contrast material. Multi-planar reformatted images were generated for additional analysis. Nongated technique limits cardiac detail.    All CT scans at [facility] and [place] are performed using dose optimization techniques as appropriate to a performed exam, including but not limited to one or more of the following: automated exposure control, adjustment of the mA and/or kV according to patient size, use of iterative reconstruction technique. In addition, [place] is participating in the [place] program which will further assist [place] in optimizing patient radiation exposure.  FINDINGS:   LOWER CHEST: . Mediastinum: Within normal limits.  . Heart/vessel: Normal heart size. No pericardial effusion. Minimal coronary artery calcification. . Lungs: Within normal limits. . Pleura: Within normal limits.   ABDOMEN: . Liver: Mild hepatomegaly. Liver span measured at 18.4 cm in the right MCL. . Gallbladder/biliary: Cholecystectomy. Mild intra and extrahepatic biliary ductal dilatation likely related to the postcholecystectomy state, and similar in appearance to the prior examination.  Marland Kitchen Spleen: The spleen may be mildly enlarged. The AP dimension is measured at 13.9 cm. Small splenules evident area. Pancreas: Within normal limits. . Adrenals: Within normal limits. . Kidneys:  Large staghorn calculi are again seen in the bilateral renal collecting systems. . Peritoneum: Within normal limits. . Mesentery: Within normal limits. . Extraperitoneum: Small fat-containing umbilical hernia. The previously noted water density structure in this region has essentially resolved with only a small residual. . GI tract: No bowel obstruction. Duodenal diverticulum. Portions of a normal appendix are seen. . Vascular: Minimal aortobiiliac atherosclerosis without aneurysm.  PELVIS: . Peritoneum: Within normal limits. . Extraperitoneum: Within normal limits. . Ureters: Within normal limits. . Bladder: Within normal limits. . Reproductive system: Similar large uterine mass measuring approximately 15.2 x 18.9 x 18.8 cm (AP x Trans x Sag), again most compatible with a degenerated fibroid. . Vascular: Minimal aortobiiliac atherosclerosis.  MSK: . 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  Other Result Information  Interface, Rad Results In - 09/07/2018  7:24 AM EDT CT ABDOMEN PELVIS W CONTRAST (ROUTINE), 09/06/2018 7:38 PM  INDICATION:abdominal pain \ I09.73 Periumbilical abdominal pain  COMPARISON: CT abdomen pelvis from 07/12/2018  TECHNIQUE: Multislice axial images were obtained through the abdomen and pelvis with administration of iodinated intravenous contrast material. Multi-planar reformatted images were generated for additional analysis. Nongated technique limits cardiac detail.      All CT scans at [facility] and [place] are performed using dose optimization techniques as appropriate to a performed exam, including but not limited to one or more of the following: automated exposure control, adjustment of the mA and/or kV according to patient size, use of iterative reconstruction technique. In addition, [place] is participating in the [place] program which will further assist [place] in optimizing patient  radiation exposure.  FINDINGS:   LOWER CHEST: .  Mediastinum: Within normal limits.  .  Heart/vessel: Normal heart size. No pericardial effusion. Minimal coronary artery calcification. Marland Kitchen  Lungs: Within normal limits. .  Pleura: Within normal limits.   ABDOMEN: .  Liver: Mild hepatomegaly. Liver span measured at 18.4 cm in the right MCL. .  Gallbladder/biliary: Cholecystectomy. Mild intra and extrahepatic biliary ductal dilatation likely related to the postcholecystectomy state, and similar in appearance to the prior examination.  Marland Kitchen  Spleen: The spleen may be mildly enlarged. The AP dimension is measured at 13.9 cm. Small splenules evident area.  Pancreas: Within normal limits. .  Adrenals: Within normal limits. .  Kidneys: Large staghorn calculi are again seen in the bilateral renal collecting systems. .  Peritoneum: Within normal limits. .  Mesentery: Within normal limits. .  Extraperitoneum: Small fat-containing umbilical hernia. The previously noted water density structure in this region has essentially resolved with only a small residual. .  GI tract: No bowel obstruction. Duodenal diverticulum. Portions of a normal appendix are seen. .  Vascular: Minimal aortobiiliac atherosclerosis without aneurysm.  PELVIS: .  Peritoneum: Within normal limits. .  Extraperitoneum: Within normal limits. .  Ureters: Within normal limits. .  Bladder: Within normal limits. .  Reproductive system: Similar large uterine mass measuring approximately 15.2 x 18.9 x 18.8 cm (AP x Trans x Sag), again most compatible with a degenerated fibroid. .  Vascular: Minimal aortobiiliac atherosclerosis.  MSK: .  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  CONCLUSION:  1.  No acute findings within the abdomen or pelvis. 2.  Small fat-containing umbilical hernia. The previously noted water density structure in this region has essentially resolved. 3.  Large degenerated fibroid uterus. 4.   Similar appearance of staghorn calculi occupying the majority of the bilateral renal collecting systems. 5.  Additional findings as detailed in the body of the report.  Status    Assessment/Plan: Large uterine mass which is stable since CT was done 08/2018 at [facility].  Patient Active Problem List   Diagnosis Date Noted  . Right hip pain   . Type 2 diabetes mellitus without complication ([place])   . Essential hypertension   . CHF (congestive heart failure) ([place]) 10/21/2019  . Elevated troponin 10/21/2019  . AKI (acute kidney injury) ([place]) 10/21/2019  . Hyperglycemia 10/21/2019  . Atrial fibrillation with RVR ([place]) 10/21/2019  . Atrial fibrillation with rapid ventricular response ([place]) 10/20/2019  The mass is consistent with a large fibroid and is stable since 08/2018 with previous imaging. She has minimal symptoms referable to the mass and does not have menorrhagia or dysmenorrhea. I would consider that this can be addressed after she recovers during this hospitalization and I will request an appointment at [facility] after she is discharged. Although she needs a pap test and endometrial biopsy there is little likelihood that she has a pelvic malignancy. I answered her questions to her satisfaction. Thank you for the consult  I viewed the images of the CT scan 40 min face to face and chart review and coordination of care  Emeterio Reeve 10/25/2019  4:37 PM   [phone]

## 2019-10-25 NOTE — Progress Notes (Signed)
PROGRESS NOTE  Adrienne Lane IEP:[id number] DOB: 10-19-1966 DOA: 10/20/2019 PCP: Patient, No Pcp Per   LOS: 5 days   Brief Narrative / Interim history: Patient is a 53 year old African-American female, morbidly obese, with past medical history significant for diabetes mellitus type 2, hypertension, uterine fibroids, chronic leg edema/anasarca and query history of atrial fibrillation not on any medication.  Patient was admitted with right-sided hip pain following a fall.  Patient has not seen a provider for the last 1 to 2 years due to lack of insurance, and has not been compliant with his medications and insulin.  Patient developed progressive leg edema with increased abdominal girth over the last 4 months.  On presentation to the hospital, patient was found to be in atrial fibrillation with rapid ventricular response.  Echocardiogram done revealed EF of 20-25 %, global hypokinesis and RVSP of 58.3 mmHg.  Cardiology input is appreciated.  Significantly, despite severe anasarca, patient is not on oxygen.  Worsening renal function is reported.  UA reveals protein, few bacteria, greater than 50 RBC and greater than 50 WBC.  Renal ultrasound is negative for obstruction, better revealed bilateral staghorn calculi.  Albumin is low.  Nephrology team has also been consulted.  Patient will be started on IV albumin and Lasix.  For GN work-up as per nephrology.  Further management will depend on hospital course.  Subjective: No shortness of breath at rest.  Assessment & Plan: Principal Problem:   Atrial fibrillation with rapid ventricular response (HCC) Active Problems:   CHF (congestive heart failure) (HCC)   Elevated troponin   AKI (acute kidney injury) ([place])   Hyperglycemia   Atrial fibrillation with RVR (HCC)   Right hip pain   Type 2 diabetes mellitus without complication (HCC)   Essential hypertension   Uterine fibroid   A. fib with RVR: -Heart rate is controlled.   -Continue  anticoagulation with heparin drip.   -Significantly low EF noted on echocardiogram could be secondary to tachycardia cardiomyopathy.   -Cardiology input is appreciated.  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 -Etiology of progressive edema/anasarca may be multifactorial. -Cardiology input is appreciated.  Acute kidney injury: -Patient's creatinine in 2019 was 0.9-1.0 range with labs done at [facility]. -Creatinine this admission 1.7, with diuresis gradually increasing and now it is 2.4.  Hold further Lasix. -Renal ultrasound negative for hydronephrosis  -Bilateral staghorn calculi noted. -Nephrology team has been consulted.  For possible GN work-up.  Bilateral staghorn renal calculi -Urology consulted, no need for interventions at this point.    Type 2 diabetes mellitus: -Poorly controlled, with hyperglycemia.  -Continue sliding scale and Lantus -Continue to optimize.  Right hip pain secondary to mechanical fall -Negative imaging for fractures  Normocytic anemia -Chronic disease  Uterine mass -CT scan of the abdomen and pelvis showed marked enlargement of the uterine fundus measuring up to 17 cm with heterogeneous attenuation and irregular curvilinear air lucency towards the superior aspect of this masslike and large amount.  This is concerning for endometrial carcinoma. -Patient apparently is aware of having uterine mass, she was told last year at [facility] that she is having uterine fibroids -Gynecology team consulted.  For further management on outpatient basis.  Scheduled Meds: . amiodarone  400 mg Oral BID   Followed by  . [START ON 10/31/2019] amiodarone  200 mg Oral  Daily  . insulin aspart  0-5 Units Subcutaneous QHS  . insulin aspart  0-9 Units Subcutaneous TID WC  . insulin glargine  12 Units  Subcutaneous Daily  . isosorbide-hydrALAZINE  2 tablet Oral TID  . metoprolol succinate  200 mg Oral Daily   Continuous Infusions: . furosemide    . heparin 1,850 Units/hr (10/25/19 1454)   PRN Meds:.acetaminophen **OR** acetaminophen, ondansetron (ZOFRAN) IV  DVT prophylaxis: heparin Code Status: Full code Family Communication:  Disposition Plan: This will depend on hospital course.  Consultants:  Cardiology  Urology  Nephrology  Procedures:   2D echo 10/21/2019  Plain films of the right hip and pelvis 10/20/2019  CXR 10/20/2019  Renal ultrasound 10/22/2019  Microbiology  None   Antimicrobials: None     Objective: Vitals:   10/24/19 2234 10/25/19 0500 10/25/19 0508 10/25/19 1235  BP: (!) 146/121  129/82 (!) 138/94  Pulse: 73  100 77  Resp:   20 18  Temp:   98.4 F (36.9 C) 97.7 F (36.5 C)  TempSrc:   Oral Oral  SpO2:   99% 98%  Weight:  (!) 151.4 kg    Height:        Intake/Output Summary (Last 24 hours) at 10/25/2019 1814 Last data filed at 10/25/2019 1513 Gross per 24 hour  Intake 918.38 ml  Output 300 ml  Net 618.38 ml   Filed Weights   10/24/19 0500 10/24/19 0600 10/25/19 0500  Weight: (!) 150.5 kg (!) 149.7 kg (!) 151.4 kg    Examination:  Constitutional: Patient is morbidly obese.  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   Eyes: Pallor.  No jaundice.  ENMT: Mucous membranes are moist.  JVD is equivocal. Respiratory: clear to auscultation bilaterally Cardiovascular: S1-S2, irregular.   Abdomen: Mildly obese, pitting edema, organs are difficult to assess. Musculoskeletal: 2+ lower extremity edema.  Abdominal wall edema. Neurologic: Awake and alert.  Patient moves all extremities.  Data Reviewed: I have independently reviewed following labs and imaging studies   CBC: Recent Labs  Lab 10/20/19 1958 10/21/19 0703 10/22/19 0014 10/23/19 0424 10/24/19 0410 10/25/19 0441  WBC 6.2 5.8 6.7 6.1 5.8 5.8  NEUTROABS 4.3  --   --   --   --   --    HGB 11.9* 10.4* 10.5* 10.5* 9.7* 10.2*  HCT 39.7 35.1* 34.9* 34.4* 32.3* 34.3*  MCV 80.5 80.5 80.8 79.6* 79.6* 80.7  PLT PLATELET CLUMPS NOTED ON SMEAR, UNABLE TO ESTIMATE 173 171 197 228 694   Basic Metabolic Panel: Recent Labs  Lab 10/20/19 2028  10/22/19 0925 10/22/19 1716 10/23/19 0424 10/24/19 0410 10/25/19 0441  NA  --    < > 138 138 137 139 137  K  --    < > 3.4* 4.3 4.6 3.8 4.3  CL  --    < > 104 106 106 106 103  CO2  --    < > 23 23 20* 22 20*  GLUCOSE  --    < > 226* 251* 221* 229* 213*  BUN  --    < > 37* 36* 42* 46* 49*  CREATININE  --    < > 1.98* 2.08* 2.24* 2.39* 2.65*  CALCIUM  --    < > 8.8* 8.9 8.6* 8.4* 8.7*  MG 2.1  --  1.8  --  2.0  --   --   PHOS  --   --   --   --   --  3.9 4.3   < > =  values in this interval not displayed.   GFR: Estimated Creatinine Clearance: 39.2 mL/min (A) (by C-G formula based on SCr of 2.65 mg/dL (H)). Liver Function Tests: Recent Labs  Lab 10/22/19 0925 10/24/19 0410 10/25/19 0441  AST 23  --   --   ALT 22  --   --   ALKPHOS 69  --   --   BILITOT 1.2  --   --   PROT 6.5  --   --   ALBUMIN 2.8* 2.4* 2.7*   No results for input(s): LIPASE, AMYLASE in the last 168 hours. No results for input(s): AMMONIA in the last 168 hours. Coagulation Profile: Recent Labs  Lab 10/20/19 2304 10/21/19 0710  INR 1.5* 1.4*   Cardiac Enzymes: No results for input(s): CKTOTAL, CKMB, CKMBINDEX, TROPONINI in the last 168 hours. BNP (last 3 results) No results for input(s): PROBNP in the last 8760 hours. HbA1C: No results for input(s): HGBA1C in the last 72 hours. CBG: Recent Labs  Lab 10/24/19 2039 10/24/19 2311 10/25/19 0729 10/25/19 1231 10/25/19 1557  GLUCAP 333* 240* 198* 266* 288*   Lipid Profile: No results for input(s): CHOL, HDL, LDLCALC, TRIG, CHOLHDL, LDLDIRECT in the last 72 hours. Thyroid Function Tests: No results for input(s): TSH, T4TOTAL, FREET4, T3FREE, THYROIDAB in the last 72 hours. Anemia Panel: No  results for input(s): VITAMINB12, FOLATE, FERRITIN, TIBC, IRON, RETICCTPCT in the last 72 hours. Urine analysis:    Component Value Date/Time   COLORURINE YELLOW 10/23/2019 1151   APPEARANCEUR CLOUDY (A) 10/23/2019 1151   LABSPEC 1.006 10/23/2019 1151   PHURINE 7.0 10/23/2019 1151   GLUCOSEU NEGATIVE 10/23/2019 1151   HGBUR MODERATE (A) 10/23/2019 1151   BILIRUBINUR NEGATIVE 10/23/2019 1151   KETONESUR NEGATIVE 10/23/2019 1151   PROTEINUR 100 (A) 10/23/2019 1151   NITRITE NEGATIVE 10/23/2019 1151   LEUKOCYTESUR LARGE (A) 10/23/2019 1151   Sepsis Labs: Invalid input(s): PROCALCITONIN, LACTICIDVEN  Recent Results (from the past 240 hour(s))  SARS CORONAVIRUS 2 (TAT 6-24 HRS) Nasopharyngeal Nasopharyngeal Swab     Status: None   Collection Time: 10/20/19 10:45 PM   Specimen: Nasopharyngeal Swab  Result Value Ref Range Status   SARS Coronavirus 2 NEGATIVE NEGATIVE Final    Comment: (NOTE) SARS-CoV-2 target nucleic acids are NOT DETECTED. The SARS-CoV-2 RNA is generally detectable in upper and lower respiratory specimens during the acute phase of infection. Negative results do not preclude SARS-CoV-2 infection, do not rule out co-infections with other pathogens, and should not be used as the sole basis for treatment or other patient management decisions. Negative results must be combined with clinical observations, patient history, and epidemiological information. The expected result is Negative. Fact Sheet for Patients: SugarRoll.be Fact Sheet for Healthcare Providers: https://www.woods-mathews.com/ This test is not yet approved or cleared by the [place] FDA and  has been authorized for detection and/or diagnosis of SARS-CoV-2 by FDA under an Emergency Use Authorization (EUA). This EUA will remain  in effect (meaning this test can be used) for the duration of the COVID-19 declaration under Section 56 4(b)(1) of the Act, 21 U.S.C.  section 360bbb-3(b)(1), unless the authorization is terminated or revoked sooner. Performed at [facility], [place] [address], [place], [place] [address]   Culture, Urine     Status: Abnormal   Collection Time: 10/23/19 11:51 AM   Specimen: Urine, Catheterized  Result Value Ref Range Status   Specimen Description   Final    URINE, CATHETERIZED Performed at [facility],  [place] [address], [place], [place] [address]    Special Requests   Final    NONE Performed at [facility], [place] [address] [place],  [address]    Culture MULTIPLE SPECIES PRESENT, SUGGEST RECOLLECTION (A)  Final   Report Status 10/24/2019 FINAL  Final      Radiology Studies: Ct Renal Stone Study  Addendum Date: 10/23/2019   ADDENDUM REPORT: 10/23/2019 21:29 ADDENDUM: Findings and recommendations were discussed with NP Baltazar Najjar via telephone on 10/23/2019 at 9:29 p.m. Electronically Signed   By: Lovena Le M.D.   On: 10/23/2019 21:29   Result Date: 10/23/2019 CLINICAL DATA:  Renal failure, history of bilateral staghorn calculi with new renal failure EXAM: CT ABDOMEN AND PELVIS WITHOUT CONTRAST TECHNIQUE: Multidetector CT imaging of the abdomen and pelvis was performed following the standard protocol without IV contrast. COMPARISON:  None. FINDINGS: Lower chest: Regions of mosaic attenuation in the lower lungs may reflect areas of air trapping versus imaging during exhalation. No consolidative opacity. Small right pleural effusion. No left effusion. Marked cardiomegaly with four-chamber enlargement. Coronary artery calcifications are present. No pericardial effusion. Hepatobiliary: No visible or contour deforming liver abnormality is seen. Patient is post cholecystectomy. Slight prominence of the biliary tree likely related to reservoir effect. No calcified intraductal gallstones. Pancreas: Unremarkable. No pancreatic ductal dilatation or surrounding inflammatory changes.  Spleen: Normal in size without focal abnormality. Adrenals/Urinary Tract: Adrenal glands are difficult to visualize due to increased attenuation of the mesentery and photon starvation towards the midline abdomen. The kidneys demonstrate extensive bilateral staghorn calculi. No visible hydronephrosis. Difficult to follow the course of either ureter given lack of contrast and fluid in the abdomen. No contour deforming renal lesions are seen. There is circumferential bladder wall thickening. Stomach/Bowel: Distal esophagus, stomach and duodenal sweep are unremarkable. No small bowel dilatation or wall thickening. Nonvisualization of the appendix. No convincing pericecal inflammation. No colonic dilatation or wall thickening. Vascular/Lymphatic: Atherosclerotic plaque within the normal caliber aorta. Assessment of the lymph nodes is limited in the absence of contrast media. Central mesenteric lymph nodes appear slightly engorged and edematous. Reproductive: There is marked enlargement of the uterine fundus measuring up to 17 x 16 x 16 cm in size. There is heterogeneous attenuation of the uterus. A curvilinear air lucency is noted towards the superior aspect of this masslike enlargement. Difficult to discern the margins of the ovaries. Other: Moderate volume free fluid throughout the abdomen with central mesenteric hazy congestion and edema. Extensive severe body wall edema is seen circumferentially. Fat containing umbilical hernia is noted. No bowel containing hernias. Musculoskeletal: No acute osseous abnormality or suspicious osseous lesion. Multilevel degenerative changes are present in the imaged portions of the spine. Degenerative changes most pronounced at L4-5. 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 IMPRESSION: 1. Imaging quality is significantly degraded due to patient body habitus with resulting central photon starvation. Evaluation is furthermore  complicated by lack of intravenous contrast material. 2. Marked enlargement of the uterine fundus measuring up to 17 cm in size, with heterogeneous attenuation and a an irregular curvilinear air lucency towards the superior aspect of this masslike enlargement. 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 Recommend further evaluation with pelvic ultrasound. 3. Extensive bilateral staghorn calculi. No visible hydronephrosis. Difficult to follow the course of either ureter given lack of contrast and fluid extensive fluid in the abdomen. 4. Severe anasarca with extensive body wall edema, mesenteric congestion, ascites, and right pleural  effusion. 5. Circumferential thickening of the urinary bladder. Correlate with urinalysis to exclude cystitis. 6. Marked cardiomegaly with four-chamber enlargement. 7. Regions of mosaic attenuation in the lower lungs may reflect areas of air trapping versus imaging during exhalation. 8. Aortic Atherosclerosis (ICD10-I70.0). Electronically Signed: By: Lovena Le M.D. On: 10/23/2019 20:58   US Pelvic Complete With Transvaginal  Result Date: 10/24/2019 CLINICAL DATA:  Initial evaluation for uterine anomaly. EXAM: TRANSABDOMINAL AND TRANSVAGINAL ULTRASOUND OF PELVIS TECHNIQUE: Both transabdominal and transvaginal ultrasound examinations of the pelvis were performed. Transabdominal technique was performed for global imaging of the pelvis including uterus, ovaries, adnexal regions, and pelvic cul-de-sac. It was necessary to proceed with endovaginal exam following the transabdominal exam to visualize the uterus, endometrium, and ovaries. COMPARISON:  Prior CT from 10/23/2019. FINDINGS: Uterus Evaluation of the uterus is limited given body habitus in size. Uterus measures up to approximately 16.3 x 15.8 x 15.8 cm. Previously identified large masslike lesion involving the uterus is not well delineated given size, and could reflect a large fibroid or possibly endometrial mass.  Endometrium Not definitely visualized. Right ovary Not visualized.  No adnexal mass. Left ovary Not visualized.  No adnexal mass. Other findings Moderate volume free fluid within the pelvis. IMPRESSION: 1. Technically limited exam due to body habitus. 2. Marked enlargement of the uterus, corresponding with abnormality seen on prior CT. Unclear whether this reflects a large uterine fibroid or possibly endometrial mass, not well assessed on this technically limited exam. Given the limitations of this study, further assessment with dedicated pelvic MRI is recommended for further characterization. Given the patient's low GFR, a noncontrast examination could be performed. 3. Nonvisualization of the ovaries.  No other adnexal mass. 4. Moderate volume free fluid within the pelvis. Electronically Signed   By: Jeannine Boga M.D.   On: 10/24/2019 18:48   Dana Allan MD. [facility]  Contact via  [email or web address]  [place] P: [phone] F: [phone]

## 2019-10-25 NOTE — Progress Notes (Signed)
[facility] for IV heparin Indication: atrial fibrillation  No Known Allergies  Patient Measurements: Height: 5\' 8"  (172.7 cm) Weight: (!) 333 lb 12.4 oz (151.4 kg) IBW/kg (Calculated) : 63.9 Heparin Dosing Weight: 100.6 kg  Vital Signs: Temp: 98.4 F (36.9 C) (11/05 0508) Temp Source: Oral (11/05 0508) BP: 129/82 (11/05 0508) Pulse Rate: 100 (11/05 0508)  Labs: Recent Labs    10/23/19 0424 10/24/19 0410 10/25/19 0441  HGB 10.5* 9.7* 10.2*  HCT 34.4* 32.3* 34.3*  PLT 197 228 177  HEPARINUNFRC 0.42 0.44 0.28*  CREATININE 2.24* 2.39* 2.65*    Estimated Creatinine Clearance: 39.2 mL/min (A) (by C-G formula based on SCr of 2.65 mg/dL (H)).   Assessment: 89 yoF with PMH DM2, HTN, presents with R hip pain after falling. Also reports several months of LE edema, orthopnea, and abdominal distention. Found to have elevated BNP and to also be in AFib with RVR. Pharmacy to dose heparin   Baseline INR, aPTT: elevated; charted as being drawn before heparin started, but RN notes report drawing aPTT/INR while heparin running  Prior anticoagulation: none  10/25/2019  heparin level 0.28 is slightly below goal on heparin at 1750 units/hr CBC stable, no bleeding reported, RN reports IV infusing well/no issues  Goal of Therapy: Heparin level 0.3-0.7 units/ml Monitor platelets by anticoagulation protocol: Yes  Plan:  Increase heparin drip to 1850 units/hr and check 8 hr heparin level  Daily CBC, daily heparin level   Monitor for signs of bleeding or thrombosis  Issues with coverage after moving to [place]; recommend CM consult to assist in determining preferred oral anticoagulant  To continue heparin drip for now as she will likely need cardiac cath and TEE/DCCV   Eudelia Bunch, Pharm.D [phone] 10/25/2019 8:08 AM

## 2019-10-25 NOTE — Progress Notes (Signed)
Pharmacy Brief Note - Anticoagulation Follow Up:  Patient on heparin infusion for atrial fibrillation.   Assessment:  HL = 0.42 is therapeutic on current heparin infusion rate of 1850 units/hr  Confirmed heparin infusing at correct rate. No issues with infusion, no signs/symptoms of bleeding or bruising per discussion with RN  Goal: HL 0.3 - 0.7  Plan:  Continue heparin infusion at current rate of 1850 units/hr  Check confirmatory HL in 6 hours  CBC daily   Lenis Noon, PharmD 10/25/19 7:47 PM

## 2019-10-26 LAB — CBC WITH DIFFERENTIAL/PLATELET
Abs Immature Granulocytes: 0.03 10*3/uL (ref 0.00–0.07)
Basophils Absolute: 0.1 10*3/uL (ref 0.0–0.1)
Basophils Relative: 1 %
Eosinophils Absolute: 0.1 10*3/uL (ref 0.0–0.5)
Eosinophils Relative: 2 %
HCT: 33 % — ABNORMAL LOW (ref 36.0–46.0)
Hemoglobin: 9.9 g/dL — ABNORMAL LOW (ref 12.0–15.0)
Immature Granulocytes: 1 %
Lymphocytes Relative: 23 %
Lymphs Abs: 1.4 10*3/uL (ref 0.7–4.0)
MCH: 24.3 pg — ABNORMAL LOW (ref 26.0–34.0)
MCHC: 30 g/dL (ref 30.0–36.0)
MCV: 80.9 fL (ref 80.0–100.0)
Monocytes Absolute: 0.8 10*3/uL (ref 0.1–1.0)
Monocytes Relative: 13 %
Neutro Abs: 3.7 10*3/uL (ref 1.7–7.7)
Neutrophils Relative %: 60 %
Platelets: 227 10*3/uL (ref 150–400)
RBC: 4.08 MIL/uL (ref 3.87–5.11)
RDW: 18.9 % — ABNORMAL HIGH (ref 11.5–15.5)
WBC: 6 10*3/uL (ref 4.0–10.5)
nRBC: 0.5 % — ABNORMAL HIGH (ref 0.0–0.2)

## 2019-10-26 LAB — PROTEIN ELECTROPHORESIS, SERUM
A/G Ratio: 0.8 (ref 0.7–1.7)
Albumin ELP: 2.9 g/dL (ref 2.9–4.4)
Alpha-1-Globulin: 0.3 g/dL (ref 0.0–0.4)
Alpha-2-Globulin: 1 g/dL (ref 0.4–1.0)
Beta Globulin: 1 g/dL (ref 0.7–1.3)
Gamma Globulin: 1.5 g/dL (ref 0.4–1.8)
Globulin, Total: 3.8 g/dL (ref 2.2–3.9)
Total Protein ELP: 6.7 g/dL (ref 6.0–8.5)

## 2019-10-26 LAB — GLUCOSE, CAPILLARY
Glucose-Capillary: 227 mg/dL — ABNORMAL HIGH (ref 70–99)
Glucose-Capillary: 235 mg/dL — ABNORMAL HIGH (ref 70–99)
Glucose-Capillary: 257 mg/dL — ABNORMAL HIGH (ref 70–99)
Glucose-Capillary: 282 mg/dL — ABNORMAL HIGH (ref 70–99)

## 2019-10-26 LAB — RENAL FUNCTION PANEL
Albumin: 2.9 g/dL — ABNORMAL LOW (ref 3.5–5.0)
Anion gap: 13 (ref 5–15)
BUN: 51 mg/dL — ABNORMAL HIGH (ref 6–20)
CO2: 22 mmol/L (ref 22–32)
Calcium: 8.9 mg/dL (ref 8.9–10.3)
Chloride: 103 mmol/L (ref 98–111)
Creatinine, Ser: 2.86 mg/dL — ABNORMAL HIGH (ref 0.44–1.00)
GFR calc Af Amer: 21 mL/min — ABNORMAL LOW (ref >60)
GFR calc non Af Amer: 18 mL/min — ABNORMAL LOW (ref >60)
Glucose, Bld: 213 mg/dL — ABNORMAL HIGH (ref 70–99)
Phosphorus: 4.6 mg/dL (ref 2.5–4.6)
Potassium: 4.4 mmol/L (ref 3.5–5.1)
Sodium: 138 mmol/L (ref 135–145)

## 2019-10-26 LAB — LIPID PANEL
Cholesterol: 122 mg/dL (ref 0–200)
HDL: 49 mg/dL (ref >40)
LDL Cholesterol: 62 mg/dL (ref 0–99)
Total CHOL/HDL Ratio: 2.5 RATIO
Triglycerides: 57 mg/dL (ref <150)
VLDL: 11 mg/dL (ref 0–40)

## 2019-10-26 LAB — ANTISTREPTOLYSIN O TITER: ASO: <20 IU/mL (ref 0.0–200.0)

## 2019-10-26 LAB — KAPPA/LAMBDA LIGHT CHAINS
Kappa free light chain: 163.6 mg/L — ABNORMAL HIGH (ref 3.3–19.4)
Kappa, lambda light chain ratio: 2.6 — ABNORMAL HIGH (ref 0.26–1.65)
Lambda free light chains: 63 mg/L — ABNORMAL HIGH (ref 5.7–26.3)

## 2019-10-26 LAB — MAGNESIUM: Magnesium: 2.2 mg/dL (ref 1.7–2.4)

## 2019-10-26 LAB — ANTI-DNA ANTIBODY, DOUBLE-STRANDED: ds DNA Ab: <1 IU/mL (ref 0–9)

## 2019-10-26 LAB — MPO/PR-3 (ANCA) ANTIBODIES
ANCA Proteinase 3: 6.6 U/mL — ABNORMAL HIGH (ref 0.0–3.5)
Myeloperoxidase Abs: 13.8 U/mL — ABNORMAL HIGH (ref 0.0–9.0)

## 2019-10-26 LAB — C3 COMPLEMENT: C3 Complement: 152 mg/dL (ref 82–167)

## 2019-10-26 LAB — HEPARIN LEVEL (UNFRACTIONATED)
Heparin Unfractionated: 0.36 IU/mL (ref 0.30–0.70)
Heparin Unfractionated: 0.37 IU/mL (ref 0.30–0.70)

## 2019-10-26 LAB — C4 COMPLEMENT: Complement C4, Body Fluid: 26 mg/dL (ref 12–38)

## 2019-10-26 MED ORDER — SODIUM CHLORIDE 0.9 % IV SOLN
510.0000 mg | INTRAVENOUS | Status: AC
  Administered 2019-10-26 – 2019-11-02 (×2): 510 mg via INTRAVENOUS
  Filled 2019-10-26 (×4): qty 17

## 2019-10-26 NOTE — Progress Notes (Signed)
Physical Therapy Treatment Patient Details Name: Adrienne Lane MRN: [id number] DOB: Apr 29, 1966 Today's Date: 10/26/2019    History of Present Illness 53 yo female admitted to ED on 10/29 with fall out of minivan and R hip and LE pain. Xray negative for fracture. Pt found to be in afib with RVR, sHF (new diagnosis). PMH includes DM with A1C 11.7, HTN.    PT Comments    Pt reports fatigue today and not able to ambulate as far, but did demonstrate improved transfers. Continues to need min guard for safety and vital signs monitored. Will continue to benefit from acute PT.    Follow Up Recommendations  Supervision for mobility/OOB;No PT follow up     Equipment Recommendations  None recommended by PT    Recommendations for Other Services       Precautions / Restrictions Precautions Precautions: Fall Restrictions Weight Bearing Restrictions: No    Mobility  Bed Mobility               General bed mobility comments: In chair at arrival  Transfers Overall transfer level: Needs assistance Equipment used: None Transfers: Sit to/from Stand Sit to Stand: Min guard         General transfer comment: Performed x 2 from low surface; required 2 attempts to stand  Ambulation/Gait Ambulation/Gait assistance: Min guard Gait Distance (Feet): 50 Feet Assistive device: IV Pole Gait Pattern/deviations: Step-through pattern;Wide base of support;Decreased stride length Gait velocity: decr   General Gait Details: Required 1 seated and 2 standing rest breaks.  Reports "just not feeling it today, little SHOB".  Upon return to room pt did report she stood for several minutes for ADLs earlier with nurse tech and is likely just fatigued. O2 sats were 100% on RA and HR 60-70 bpm   Stairs             Wheelchair Mobility    Modified Rankin (Stroke Patients Only)       Balance Overall balance assessment: Needs assistance Sitting-balance support: No upper extremity  supported;Feet supported Sitting balance-Leahy Scale: Good     Standing balance support: No upper extremity supported Standing balance-Leahy Scale: Fair                              Cognition Arousal/Alertness: Awake/alert Behavior During Therapy: WFL for tasks assessed/performed Overall Cognitive Status: Within Functional Limits for tasks assessed                                 General Comments: Reports hip feeling better      Exercises      General Comments        Pertinent Vitals/Pain Pain Assessment: No/denies pain    Home Living                      Prior Function            PT Goals (current goals can now be found in the care plan section) Progress towards PT goals: Progressing toward goals(overall improving but was not able to ambulate as far today due to fatigue)    Frequency    Min 3X/week      PT Plan Current plan remains appropriate    Co-evaluation              AM-PAC PT "6 Clicks" Mobility   Outcome Measure  Help needed turning from your back to your side while in a flat bed without using bedrails?: A Little Help needed moving from lying on your back to sitting on the side of a flat bed without using bedrails?: A Little Help needed moving to and from a bed to a chair (including a wheelchair)?: None Help needed standing up from a chair using your arms (e.g., wheelchair or bedside chair)?: None Help needed to walk in hospital room?: None Help needed climbing 3-5 steps with a railing? : A Little 6 Click Score: 21    End of Session Equipment Utilized During Treatment: Gait belt Activity Tolerance: Patient limited by fatigue Patient left: in chair;with call bell/phone within reach(chair alarm not in place at arrival, pt following commands and calls for assist) Nurse Communication: Mobility status PT Visit Diagnosis: Other abnormalities of gait and mobility (R26.89);History of falling  (Z91.81);Unsteadiness on feet (R26.81)     Time: 4643-1427 PT Time Calculation (min) (ACUTE ONLY): 12 min  Charges:  $Gait Training: 8-22 mins                     Maggie Font, PT Acute Rehab Services [phone]    [name] 10/26/2019, 12:31 PM

## 2019-10-26 NOTE — Progress Notes (Signed)
PROGRESS NOTE  Adrienne Lane TMH:[id number] DOB: Oct 29, 1966 DOA: 10/20/2019 PCP: Patient, No Pcp Per   LOS: 6 days   Brief Narrative / Interim history: Patient is a 53 year old African-American female, morbidly obese, with past medical history significant for diabetes mellitus type 2, hypertension, uterine fibroids, chronic leg edema/anasarca and query history of atrial fibrillation not on any medication.  Patient was admitted with right-sided hip pain following a fall.  Patient has not seen a provider for the last 1 to 2 years due to lack of insurance, and has not been compliant with his medications and insulin.  Patient developed progressive leg edema with increased abdominal girth over the last 4 months.  On presentation to the hospital, patient was found to be in atrial fibrillation with rapid ventricular response.  Echocardiogram done revealed EF of 20-25 %, global hypokinesis and RVSP of 58.3 mmHg.  Cardiology input is appreciated.  Significantly, despite severe anasarca, patient is not on oxygen.  Worsening renal function is reported.  UA reveals protein, few bacteria, greater than 50 RBC and greater than 50 WBC.  Renal ultrasound is negative for obstruction, better revealed bilateral staghorn calculi.  Albumin is low.  Nephrology team has also been consulted.  Patient will be started on IV albumin and Lasix.  For GN work-up as per nephrology.  Further management will depend on hospital course.  10/26/2019: Patient seen.  Input from nephrology and cardiology is appreciated.  Urine output noted, however, not sure about the accuracy.  Serum creatinine is 2.86 today.  We will add 3-day course of metolazone.  Continue to monitor renal function and electrolytes closely.  Further history from the patient indicated that she may obtain 40 to 60 pounds over the last 4 months.  Subjective: No shortness of breath at rest.  Assessment & Plan: Principal Problem:   Atrial fibrillation with rapid  ventricular response (HCC) Active Problems:   CHF (congestive heart failure) (HCC)   Elevated troponin   AKI (acute kidney injury) ([place])   Hyperglycemia   Atrial fibrillation with RVR (HCC)   Right hip pain   Type 2 diabetes mellitus without complication (HCC)   Essential hypertension   Uterine fibroid   A. fib with RVR: -Heart rate is controlled.   -Continue anticoagulation with heparin drip.   -Significantly low EF noted on echocardiogram could be secondary to tachycardia cardiomyopathy.  -Cardiology input is appreciated.  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 -Etiology of progressive edema/anasarca likely multifactorial. -Cardiology input is appreciated. -Baseline EF is not known.  Echocardiogram done on 10/21/2019 revealed EF of 20 to 25%.  Cannot entirely rule out tachycardia induced cardiomyopathy (patient was on rapid A. fib on presentation)  Acute kidney injury: -Patient's creatinine in 2019 was 0.9-1.0 range with labs done at [facility] -Creatinine this admission 1.7, with diuresis gradually increasing and now it is 2.4.  Hold further Lasix. -Renal ultrasound negative for hydronephrosis  -Bilateral staghorn calculi noted. -Nephrology team has been consulted.  For possible GN work-up. 10/26/2019: Add 3-day course of metolazone.  Monitor renal function and electrolytes while on metolazone.  Continue to monitor urine output.  Serum creatinine today is 2.86.  LDL is 62  Bilateral staghorn renal calculi -Urology consulted, no need for interventions at this point.    Type 2 diabetes mellitus: -Poorly controlled, with hyperglycemia.  -Continue sliding  scale and Lantus -Continue to optimize.  Right hip pain secondary to mechanical fall -Negative imaging for fractures  Normocytic anemia -Chronic disease   Uterine mass -CT scan of the abdomen and pelvis showed marked enlargement of the uterine fundus measuring up to 17 cm with heterogeneous attenuation and irregular curvilinear air lucency towards the superior aspect of this masslike and large amount.  This is concerning for endometrial carcinoma. -Patient apparently is aware of having uterine mass, she was told last year at [facility] that she is having uterine fibroids -Gynecology team consulted.  For further management on outpatient basis.  Scheduled Meds: . amiodarone  400 mg Oral BID   Followed by  . [START ON 10/31/2019] amiodarone  200 mg Oral Daily  . insulin aspart  0-5 Units Subcutaneous QHS  . insulin aspart  0-9 Units Subcutaneous TID WC  . insulin glargine  12 Units Subcutaneous Daily  . isosorbide-hydrALAZINE  2 tablet Oral TID  . metoprolol succinate  200 mg Oral Daily   Continuous Infusions: . albumin human 12.5 g (10/26/19 1425)  . ferumoxytol 510 mg (10/26/19 1328)  . furosemide 160 mg (10/26/19 0937)  . heparin 1,850 Units/hr (10/26/19 0421)   PRN Meds:.acetaminophen **OR** acetaminophen, ondansetron (ZOFRAN) IV  DVT prophylaxis: heparin Code Status: Full code Family Communication:  Disposition Plan: This will depend on hospital course.  Consultants:  Cardiology  Urology  Nephrology  Procedures:   2D echo 10/21/2019  Plain films of the right hip and pelvis 10/20/2019  CXR 10/20/2019  Renal ultrasound 10/22/2019  Microbiology  None   Antimicrobials: None     Objective: Vitals:   10/26/19 0424 10/26/19 0610 10/26/19 0616 10/26/19 1315  BP: 102/61  136/80 (!) 117/59  Pulse: 88  66 74  Resp: 18   20  Temp: 98.2 F (36.8 C)   97.9 F (36.6 C)  TempSrc:      SpO2: 100%   100%  Weight:  (!) 152 kg    Height:        Intake/Output Summary (Last 24 hours) at 10/26/2019 1558 Last data filed at 10/26/2019 1500 Gross per 24 hour  Intake 1775.52 ml  Output 1125 ml  Net 650.52 ml   Filed Weights    10/24/19 0600 10/25/19 0500 10/26/19 0610  Weight: (!) 149.7 kg (!) 151.4 kg (!) 152 kg    Examination:  Constitutional: Patient is morbidly obese.  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   Eyes: Pallor.  No jaundice.  ENMT: Mucous membranes are moist.  JVD is equivocal. Respiratory: clear to auscultation bilaterally Cardiovascular: S1-S2, irregular.   Abdomen: Mildly obese, pitting edema, organs are difficult to assess. Musculoskeletal: 2+ to 3 lower extremity edema/Anasarca.  Abdominal wall edema. Neurologic: Awake and alert.  Patient moves all extremities.  Data Reviewed: I have independently reviewed following labs and imaging studies   CBC: Recent Labs  Lab 10/20/19 1958  10/22/19 0014 10/23/19 0424 10/24/19 0410 10/25/19 0441 10/26/19 0434  WBC 6.2   < > 6.7 6.1 5.8 5.8 6.0  NEUTROABS 4.3  --   --   --   --   --  3.7  HGB 11.9*   < > 10.5* 10.5* 9.7* 10.2* 9.9*  HCT 39.7   < > 34.9* 34.4* 32.3* 34.3* 33.0*  MCV 80.5   < > 80.8 79.6* 79.6* 80.7 80.9  PLT PLATELET CLUMPS NOTED ON SMEAR, UNABLE TO ESTIMATE   < > 171 197 228 177 227   < > =  values in this interval not displayed.   Basic Metabolic Panel: Recent Labs  Lab 10/20/19 2028  10/22/19 0925 10/22/19 1716 10/23/19 0424 10/24/19 0410 10/25/19 0441 10/26/19 0434  NA  --    < > 138 138 137 139 137 138  K  --    < > 3.4* 4.3 4.6 3.8 4.3 4.4  CL  --    < > 104 106 106 106 103 103  CO2  --    < > 23 23 20* 22 20* 22  GLUCOSE  --    < > 226* 251* 221* 229* 213* 213*  BUN  --    < > 37* 36* 42* 46* 49* 51*  CREATININE  --    < > 1.98* 2.08* 2.24* 2.39* 2.65* 2.86*  CALCIUM  --    < > 8.8* 8.9 8.6* 8.4* 8.7* 8.9  MG 2.1  --  1.8  --  2.0  --   --  2.2  PHOS  --   --   --   --   --  3.9 4.3 4.6   < > = values in this interval not displayed.   GFR: Estimated Creatinine Clearance: 36.4 mL/min (A) (by C-G formula based on SCr of 2.86 mg/dL (H)). Liver Function Tests: Recent Labs  Lab 10/22/19 0925 10/24/19 0410  10/25/19 0441 10/26/19 0434  AST 23  --   --   --   ALT 22  --   --   --   ALKPHOS 69  --   --   --   BILITOT 1.2  --   --   --   PROT 6.5  --   --   --   ALBUMIN 2.8* 2.4* 2.7* 2.9*   No results for input(s): LIPASE, AMYLASE in the last 168 hours. No results for input(s): AMMONIA in the last 168 hours. Coagulation Profile: Recent Labs  Lab 10/20/19 2304 10/21/19 0710  INR 1.5* 1.4*   Cardiac Enzymes: No results for input(s): CKTOTAL, CKMB, CKMBINDEX, TROPONINI in the last 168 hours. BNP (last 3 results) No results for input(s): PROBNP in the last 8760 hours. HbA1C: No results for input(s): HGBA1C in the last 72 hours. CBG: Recent Labs  Lab 10/25/19 1231 10/25/19 1557 10/25/19 2039 10/26/19 0740 10/26/19 1202  GLUCAP 266* 288* 274* 227* 235*   Lipid Profile: Recent Labs    10/26/19 1020  CHOL 122  HDL 49  LDLCALC 62  TRIG 57  CHOLHDL 2.5   Thyroid Function Tests: No results for input(s): TSH, T4TOTAL, FREET4, T3FREE, THYROIDAB in the last 72 hours. Anemia Panel: No results for input(s): VITAMINB12, FOLATE, FERRITIN, TIBC, IRON, RETICCTPCT in the last 72 hours. Urine analysis:    Component Value Date/Time   COLORURINE YELLOW 10/23/2019 1151   APPEARANCEUR CLOUDY (A) 10/23/2019 1151   LABSPEC 1.006 10/23/2019 1151   PHURINE 7.0 10/23/2019 1151   GLUCOSEU NEGATIVE 10/23/2019 1151   HGBUR MODERATE (A) 10/23/2019 1151   BILIRUBINUR NEGATIVE 10/23/2019 1151   KETONESUR NEGATIVE 10/23/2019 1151   PROTEINUR 100 (A) 10/23/2019 1151   NITRITE NEGATIVE 10/23/2019 1151   LEUKOCYTESUR LARGE (A) 10/23/2019 1151   Sepsis Labs: Invalid input(s): PROCALCITONIN, LACTICIDVEN  Recent Results (from the past 240 hour(s))  SARS CORONAVIRUS 2 (TAT 6-24 HRS) Nasopharyngeal Nasopharyngeal Swab     Status: None   Collection Time: 10/20/19 10:45 PM   Specimen: Nasopharyngeal Swab  Result Value Ref Range Status   SARS Coronavirus 2 NEGATIVE NEGATIVE Final  Comment:  (NOTE) SARS-CoV-2 target nucleic acids are NOT DETECTED. The SARS-CoV-2 RNA is generally detectable in upper and lower respiratory specimens during the acute phase of infection. Negative results do not preclude SARS-CoV-2 infection, do not rule out co-infections with other pathogens, and should not be used as the sole basis for treatment or other patient management decisions. Negative results must be combined with clinical observations, patient history, and epidemiological information. The expected result is Negative. Fact Sheet for Patients: SugarRoll.be Fact Sheet for Healthcare Providers: https://www.woods-mathews.com/ This test is not yet approved or cleared by the [place] FDA and  has been authorized for detection and/or diagnosis of SARS-CoV-2 by FDA under an Emergency Use Authorization (EUA). This EUA will remain  in effect (meaning this test can be used) for the duration of the COVID-19 declaration under Section 56 4(b)(1) of the Act, 21 U.S.C. section 360bbb-3(b)(1), unless the authorization is terminated or revoked sooner. Performed at [facility], [place] [address], [place], [place] [address]   Culture, Urine     Status: Abnormal   Collection Time: 10/23/19 11:51 AM   Specimen: Urine, Catheterized  Result Value Ref Range Status   Specimen Description   Final    URINE, CATHETERIZED Performed at [facility] [address] [place], [place] [address]    Special Requests   Final    NONE Performed at [facility], [place] [address] [place], [place] [address]    Culture MULTIPLE SPECIES PRESENT, SUGGEST RECOLLECTION (A)  Final   Report Status 10/24/2019 FINAL  Final      Radiology Studies: US Pelvic Complete With Transvaginal  Result Date: 10/24/2019 CLINICAL DATA:  Initial evaluation for uterine anomaly. EXAM: TRANSABDOMINAL AND TRANSVAGINAL ULTRASOUND OF PELVIS TECHNIQUE: Both  transabdominal and transvaginal ultrasound examinations of the pelvis were performed. Transabdominal technique was performed for global imaging of the pelvis including uterus, ovaries, adnexal regions, and pelvic cul-de-sac. It was necessary to proceed with endovaginal exam following the transabdominal exam to visualize the uterus, endometrium, and ovaries. COMPARISON:  Prior CT from 10/23/2019. FINDINGS: Uterus Evaluation of the uterus is limited given body habitus in size. Uterus measures up to approximately 16.3 x 15.8 x 15.8 cm. Previously identified large masslike lesion involving the uterus is not well delineated given size, and could reflect a large fibroid or possibly endometrial mass. Endometrium Not definitely visualized. Right ovary Not visualized.  No adnexal mass. Left ovary Not visualized.  No adnexal mass. Other findings Moderate volume free fluid within the pelvis. IMPRESSION: 1. Technically limited exam due to body habitus. 2. Marked enlargement of the uterus, corresponding with abnormality seen on prior CT. Unclear whether this reflects a large uterine fibroid or possibly endometrial mass, not well assessed on this technically limited exam. Given the limitations of this study, further assessment with dedicated pelvic MRI is recommended for further characterization. Given the patient's low GFR, a noncontrast examination could be performed. 3. Nonvisualization of the ovaries.  No other adnexal mass. 4. Moderate volume free fluid within the pelvis. Electronically Signed   By: Jeannine Boga M.D.   On: 10/24/2019 18:48   Dana Allan MD. [facility]  Contact via  [email or web address]  [place] P: [phone] F: [phone]

## 2019-10-26 NOTE — Progress Notes (Signed)
Patient ID: Adrienne Lane, female   DOB: [birth year], 53 y.o.   MRN: [id number] S: No new complaints.  Received 1 dose of IV lasix and albumin yesterday with increased UOP. O:BP 136/80   Pulse 66   Temp 98.2 F (36.8 C)   Resp 18   Ht 5\' 8"  (1.727 m)   Wt (!) 152 kg   LMP 10/20/2019 (Approximate)   SpO2 100%   BMI 50.95 kg/m   Intake/Output Summary (Last 24 hours) at 10/26/2019 0930 Last data filed at 10/26/2019 0914 Gross per 24 hour  Intake 1403.9 ml  Output 825 ml  Net 578.9 ml   Intake/Output: I/O last 3 completed shifts: In: 2213.9 [P.O.:1440; I.V.:596; IV Piggyback:177.9] Out: 875 [Urine:875]  Intake/Output this shift:  Total I/O In: -  Out: 250 [Urine:250] Weight change: 0.6 kg Gen: morbidly obese AAF in NAD sitting upright in chair CVS: no rub Resp: decreased BS Abd: obese, +BS, soft, NT Ext: 2+ edema  Recent Labs  Lab 10/21/19 0703 10/22/19 0925 10/22/19 1716 10/23/19 0424 10/24/19 0410 10/25/19 0441 10/26/19 0434  NA 142 138 138 137 139 137 138  K 3.6 3.4* 4.3 4.6 3.8 4.3 4.4  CL 108 104 106 106 106 103 103  CO2 24 23 23  20* 22 20* 22  GLUCOSE 180* 226* 251* 221* 229* 213* 213*  BUN 33* 37* 36* 42* 46* 49* 51*  CREATININE 1.90* 1.98* 2.08* 2.24* 2.39* 2.65* 2.86*  ALBUMIN  --  2.8*  --   --  2.4* 2.7* 2.9*  CALCIUM 8.9 8.8* 8.9 8.6* 8.4* 8.7* 8.9  PHOS  --   --   --   --  3.9 4.3 4.6  AST  --  23  --   --   --   --   --   ALT  --  22  --   --   --   --   --    Liver Function Tests: Recent Labs  Lab 10/22/19 0925 10/24/19 0410 10/25/19 0441 10/26/19 0434  AST 23  --   --   --   ALT 22  --   --   --   ALKPHOS 69  --   --   --   BILITOT 1.2  --   --   --   PROT 6.5  --   --   --   ALBUMIN 2.8* 2.4* 2.7* 2.9*   No results for input(s): LIPASE, AMYLASE in the last 168 hours. No results for input(s): AMMONIA in the last 168 hours. CBC: Recent Labs  Lab 10/20/19 1958  10/22/19 0014 10/23/19 0424 10/24/19 0410 10/25/19 0441 10/26/19 0434   WBC 6.2   < > 6.7 6.1 5.8 5.8 6.0  NEUTROABS 4.3  --   --   --   --   --  3.7  HGB 11.9*   < > 10.5* 10.5* 9.7* 10.2* 9.9*  HCT 39.7   < > 34.9* 34.4* 32.3* 34.3* 33.0*  MCV 80.5   < > 80.8 79.6* 79.6* 80.7 80.9  PLT PLATELET CLUMPS NOTED ON SMEAR, UNABLE TO ESTIMATE   < > 171 197 228 177 227   < > = values in this interval not displayed.   Cardiac Enzymes: No results for input(s): CKTOTAL, CKMB, CKMBINDEX, TROPONINI in the last 168 hours. CBG: Recent Labs  Lab 10/25/19 0729 10/25/19 1231 10/25/19 1557 10/25/19 2039 10/26/19 0740  GLUCAP 198* 266* 288* 274* 227*    Iron Studies: No results for  input(s): IRON, TIBC, TRANSFERRIN, FERRITIN in the last 72 hours. Studies/Results: US Pelvic Complete With Transvaginal  Result Date: 10/24/2019 CLINICAL DATA:  Initial evaluation for uterine anomaly. EXAM: TRANSABDOMINAL AND TRANSVAGINAL ULTRASOUND OF PELVIS TECHNIQUE: Both transabdominal and transvaginal ultrasound examinations of the pelvis were performed. Transabdominal technique was performed for global imaging of the pelvis including uterus, ovaries, adnexal regions, and pelvic cul-de-sac. It was necessary to proceed with endovaginal exam following the transabdominal exam to visualize the uterus, endometrium, and ovaries. COMPARISON:  Prior CT from 10/23/2019. FINDINGS: Uterus Evaluation of the uterus is limited given body habitus in size. Uterus measures up to approximately 16.3 x 15.8 x 15.8 cm. Previously identified large masslike lesion involving the uterus is not well delineated given size, and could reflect a large fibroid or possibly endometrial mass. Endometrium Not definitely visualized. Right ovary Not visualized.  No adnexal mass. Left ovary Not visualized.  No adnexal mass. Other findings Moderate volume free fluid within the pelvis. IMPRESSION: 1. Technically limited exam due to body habitus. 2. Marked enlargement of the uterus, corresponding with abnormality seen on prior CT.  Unclear whether this reflects a large uterine fibroid or possibly endometrial mass, not well assessed on this technically limited exam. Given the limitations of this study, further assessment with dedicated pelvic MRI is recommended for further characterization. Given the patient's low GFR, a noncontrast examination could be performed. 3. Nonvisualization of the ovaries.  No other adnexal mass. 4. Moderate volume free fluid within the pelvis. Electronically Signed   By: Jeannine Boga M.D.   On: 10/24/2019 18:48   . amiodarone  400 mg Oral BID   Followed by  . [START ON 10/31/2019] amiodarone  200 mg Oral Daily  . insulin aspart  0-5 Units Subcutaneous QHS  . insulin aspart  0-9 Units Subcutaneous TID WC  . insulin glargine  12 Units Subcutaneous Daily  . isosorbide-hydrALAZINE  2 tablet Oral TID  . metoprolol succinate  200 mg Oral Daily    BMET    Component Value Date/Time   NA 138 10/26/2019 0434   K 4.4 10/26/2019 0434   CL 103 10/26/2019 0434   CO2 22 10/26/2019 0434   GLUCOSE 213 (H) 10/26/2019 0434   BUN 51 (H) 10/26/2019 0434   CREATININE 2.86 (H) 10/26/2019 0434   CALCIUM 8.9 10/26/2019 0434   GFRNONAA 18 (L) 10/26/2019 0434   GFRAA 21 (L) 10/26/2019 0434   CBC    Component Value Date/Time   WBC 6.0 10/26/2019 0434   RBC 4.08 10/26/2019 0434   HGB 9.9 (L) 10/26/2019 0434   HCT 33.0 (L) 10/26/2019 0434   PLT 227 10/26/2019 0434   MCV 80.9 10/26/2019 0434   MCH 24.3 (L) 10/26/2019 0434   MCHC 30.0 10/26/2019 0434   RDW 18.9 (H) 10/26/2019 0434   LYMPHSABS 1.4 10/26/2019 0434   MONOABS 0.8 10/26/2019 0434   EOSABS 0.1 10/26/2019 0434   BASOSABS 0.1 10/26/2019 0434     Assessment/Plan: 1.  AKI/CKD- in setting of large, bilateral staghorn calculi, anasarca, atrial fibrillation with RVR, and new systolic CHF.  Her lasix was held due to rising Scr and we were consulted to help assist with diuresis.  She denies an history of CKD but knew about her calculi.  She  also has hypoalbuminemia, anemia, and proteinuria.   1. Complement levels and ASO WNL 2. ANCA, ANA, dsDNA, SPEP/UPEP pending 3. 24 hour urine to quantify proteinuria. 2. New onset, acute systolic CHF with anasarca- CArdiology following.  Will resume IV lasix and add IV albumin to help assist with diuresis.  Need to follow renal US to make sure she doesn't develop hydronephrosis from her bilateral staghorn calculi. 3. Atrial fibrillation- now rate controlled with amiodarone.  Currently on heparin drip 4. Bilateral staghorn calculi- evaluated by Urology.  No evidence of hydronephrosis so no indication for PNT 5. DM- poorly controlled with Hgb A1c 11.7%.  per primary 6. Right hip pain s/p mechanical fall- no fx 7. Anemia- normocytic. Low iron will replete and follow 8. Uterine mass- is a large fibroid no change from CT in 2019. Donetta Potts, MD [employer] [phone]

## 2019-10-26 NOTE — Progress Notes (Signed)
Cardiology Progress Note  Patient ID: Lauria Depoy MRN: [id number] DOB: 1966-01-23 Date of Encounter: 10/26/2019  Primary Cardiologist: Donato Heinz, MD  Subjective  She is in sinus rhythm with ventricular bigeminy at times.  Urine output is picking up.  Nephrology following.  Remains grossly volume overloaded.  ROS:  All other ROS reviewed and negative. Pertinent positives noted in the HPI.     Inpatient Medications  Scheduled Meds:  amiodarone  400 mg Oral BID   Followed by   Derrill Memo ON 10/31/2019] amiodarone  200 mg Oral Daily   insulin aspart  0-5 Units Subcutaneous QHS   insulin aspart  0-9 Units Subcutaneous TID WC   insulin glargine  12 Units Subcutaneous Daily   isosorbide-hydrALAZINE  2 tablet Oral TID   metoprolol succinate  200 mg Oral Daily   Continuous Infusions:  albumin human 12.5 g (10/26/19 0424)   ferumoxytol 510 mg (10/26/19 1328)   furosemide 160 mg (10/26/19 0937)   heparin 1,850 Units/hr (10/26/19 0421)   PRN Meds: acetaminophen **OR** acetaminophen, ondansetron (ZOFRAN) IV   Vital Signs   Vitals:   10/26/19 0424 10/26/19 0610 10/26/19 0616 10/26/19 1315  BP: 102/61  136/80 (!) 117/59  Pulse: 88  66 74  Resp: 18   20  Temp: 98.2 F (36.8 C)   97.9 F (36.6 C)  TempSrc:      SpO2: 100%   100%  Weight:  (!) 152 kg    Height:        Intake/Output Summary (Last 24 hours) at 10/26/2019 1404 Last data filed at 10/26/2019 0937 Gross per 24 hour  Intake 1643.9 ml  Output 825 ml  Net 818.9 ml   Last 3 Weights 10/26/2019 10/25/2019 10/24/2019  Weight (lbs) 335 lb 1.6 oz 333 lb 12.4 oz 330 lb 0.5 oz  Weight (kg) 152 kg 151.4 kg 149.7 kg      Telemetry  Overnight telemetry shows normal sinus rhythm with frequent PVCs.  ECG  The most recent ECG shows sinus rhythm with paroxysmal A. fib and PVCs, which I personally reviewed.   Physical Exam   Vitals:   10/26/19 0424 10/26/19 0610 10/26/19 0616 10/26/19 1315  BP: 102/61   136/80 (!) 117/59  Pulse: 88  66 74  Resp: 18   20  Temp: 98.2 F (36.8 C)   97.9 F (36.6 C)  TempSrc:      SpO2: 100%   100%  Weight:  (!) 152 kg    Height:         Intake/Output Summary (Last 24 hours) at 10/26/2019 1404 Last data filed at 10/26/2019 0937 Gross per 24 hour  Intake 1643.9 ml  Output 825 ml  Net 818.9 ml    Last 3 Weights 10/26/2019 10/25/2019 10/24/2019  Weight (lbs) 335 lb 1.6 oz 333 lb 12.4 oz 330 lb 0.5 oz  Weight (kg) 152 kg 151.4 kg 149.7 kg    Body mass index is 50.95 kg/m.  General: Well nourished, well developed, in no acute distress Head: Atraumatic, normal size  Eyes: PEERLA, EOMI  Neck: Supple, JVD noted up to earlobes around 15 to 18 cm of water Endocrine: No thryomegaly Cardiac: Irregular rhythm Lungs: Crackles at lung bases Abd: Soft, nontender, no hepatomegaly  Ext: 2+ pitting edema Musculoskeletal: No deformities, BUE and BLE strength normal and equal Skin: Warm and dry, no rashes   Neuro: Alert and oriented to person, place, time, and situation, CNII-XII grossly intact, no focal deficits  Psych: Normal  mood and affect   Labs  High Sensitivity Troponin:   Recent Labs  Lab 10/20/19 2028 10/20/19 2245  TROPONINIHS 103* 83*     Cardiac EnzymesNo results for input(s): TROPONINI in the last 168 hours. No results for input(s): TROPIPOC in the last 168 hours.  Chemistry Recent Labs  Lab 10/22/19 0925  10/24/19 0410 10/25/19 0441 10/26/19 0434  NA 138   < > 139 137 138  K 3.4*   < > 3.8 4.3 4.4  CL 104   < > 106 103 103  CO2 23   < > 22 20* 22  GLUCOSE 226*   < > 229* 213* 213*  BUN 37*   < > 46* 49* 51*  CREATININE 1.98*   < > 2.39* 2.65* 2.86*  CALCIUM 8.8*   < > 8.4* 8.7* 8.9  PROT 6.5  --   --   --   --   ALBUMIN 2.8*  --  2.4* 2.7* 2.9*  AST 23  --   --   --   --   ALT 22  --   --   --   --   ALKPHOS 69  --   --   --   --   BILITOT 1.2  --   --   --   --   GFRNONAA 29*   < > 23* 20* 18*  GFRAA 33*   < > 26* 23* 21*    ANIONGAP 11   < > 11 14 13    < > = values in this interval not displayed.    Hematology Recent Labs  Lab 10/24/19 0410 10/25/19 0441 10/26/19 0434  WBC 5.8 5.8 6.0  RBC 4.06 4.25 4.08  HGB 9.7* 10.2* 9.9*  HCT 32.3* 34.3* 33.0*  MCV 79.6* 80.7 80.9  MCH 23.9* 24.0* 24.3*  MCHC 30.0 29.7* 30.0  RDW 18.7* 19.0* 18.9*  PLT 228 177 227   BNP Recent Labs  Lab 10/20/19 2029  BNP 1,623.1*    DDimer No results for input(s): DDIMER in the last 168 hours.   Radiology  US Pelvic Complete With Transvaginal  Result Date: 10/24/2019 CLINICAL DATA:  Initial evaluation for uterine anomaly. EXAM: TRANSABDOMINAL AND TRANSVAGINAL ULTRASOUND OF PELVIS TECHNIQUE: Both transabdominal and transvaginal ultrasound examinations of the pelvis were performed. Transabdominal technique was performed for global imaging of the pelvis including uterus, ovaries, adnexal regions, and pelvic cul-de-sac. It was necessary to proceed with endovaginal exam following the transabdominal exam to visualize the uterus, endometrium, and ovaries. COMPARISON:  Prior CT from 10/23/2019. FINDINGS: Uterus Evaluation of the uterus is limited given body habitus in size. Uterus measures up to approximately 16.3 x 15.8 x 15.8 cm. Previously identified large masslike lesion involving the uterus is not well delineated given size, and could reflect a large fibroid or possibly endometrial mass. Endometrium Not definitely visualized. Right ovary Not visualized.  No adnexal mass. Left ovary Not visualized.  No adnexal mass. Other findings Moderate volume free fluid within the pelvis. IMPRESSION: 1. Technically limited exam due to body habitus. 2. Marked enlargement of the uterus, corresponding with abnormality seen on prior CT. Unclear whether this reflects a large uterine fibroid or possibly endometrial mass, not well assessed on this technically limited exam. Given the limitations of this study, further assessment with dedicated pelvic MRI is  recommended for further characterization. Given the patient's low GFR, a noncontrast examination could be performed. 3. Nonvisualization of the ovaries.  No other adnexal mass. 4. Moderate volume  free fluid within the pelvis. Electronically Signed   By: Jeannine Boga M.D.   On: 10/24/2019 18:48    Cardiac Studies  TTE 10/21/2019 1. Left ventricular ejection fraction, by visual estimation, is 20 to 25%. The left ventricle has severely decreased function. There is mildly increased left ventricular hypertrophy.  2. Left ventricular diastolic parameters are indeterminate.  3. The left ventricle demonstrates global hypokinesis.  4. Global right ventricle has normal systolic function.The right ventricular size is mildly enlarged. No increase in right ventricular wall thickness.  5. Left atrial size was moderately dilated.  6. Right atrial size was moderately dilated.  7. The pericardial effusion is circumferential.  8. Trivial pericardial effusion is present.  9. The mitral valve is normal in structure. Mild to moderate mitral valve regurgitation. No evidence of mitral stenosis. 10. The tricuspid valve is normal in structure. Tricuspid valve regurgitation moderate-severe. 11. The aortic valve is normal in structure. Aortic valve regurgitation is not visualized. No evidence of aortic valve sclerosis or stenosis. 12. The pulmonic valve was normal in structure. Pulmonic valve regurgitation is not visualized. 13. Moderately elevated pulmonary artery systolic pressure. 14. The tricuspid regurgitant velocity is 3.29 m/s, and with an assumed right atrial pressure of 15 mmHg, the estimated right ventricular systolic pressure is moderately elevated at 58.3 mmHg. 15. The inferior vena cava is dilated in size with <50% respiratory variability, suggesting right atrial pressure of 15 mmHg.  CT A/P  1. Imaging quality is significantly degraded due to patient body habitus with resulting central photon  starvation. Evaluation is furthermore complicated by lack of intravenous contrast material. 2. Marked enlargement of the uterine fundus measuring up to 17 cm in size, with heterogeneous attenuation and a an irregular curvilinear air lucency towards the superior aspect of this masslike enlargement. 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 Recommend further evaluation with pelvic ultrasound. 3. Extensive bilateral staghorn calculi. No visible hydronephrosis. Difficult to follow the course of either ureter given lack of contrast and fluid extensive fluid in the abdomen. 4. Severe anasarca with extensive body wall edema, mesenteric congestion, ascites, and right pleural effusion. 5. Circumferential thickening of the urinary bladder. Correlate with urinalysis to exclude cystitis. 6. Marked cardiomegaly with four-chamber enlargement. 7. Regions of mosaic attenuation in the lower lungs may reflect areas of air trapping versus imaging during exhalation. 8. Aortic Atherosclerosis (ICD10-I70.0).   Patient Profile  Annslee Tercero a 53 y.o.femalewith history of diabetes(A1c 11.1),CKD, obesity who was admitted on 11/1 for new onset systolic heart failure as well as atrial fibrillation with RVR. This is a new diagnosis for her.  Assessment & Plan  1.New onset systolic heart failure, ejection fraction 20 to 25%, with gross volume overload -Unclear etiology at this point, but longstanding history of diabetes/hypertension.  She does have atrial fibrillation, which can cause a cardiomyopathy.  However this is unclear what came first.  She has been evaluated by gynecology who feel that her uterine mass will not interfere with any planned procedures we have.  They do feel it is a large uterine fibroid, and this does not represent a malignancy.  This is good to know regarding her aggressiveness of heart failure medications and therapy. -EKG without any acute ischemic changes however she  will need an ischemia evaluation prior to discharge -Continue metoprolol succinate 200 mg daily, and BiDil 20-37.5 mg 2 tablets 3 times daily -No ACE/ARB given AKI -Would ultimately like to perform a cardiac cath to ensure that she has no significant CAD.  I think a right heart cath will also improve.  Given that she has significant AKI, we are holding on any cardiac at this point. -Nephrology is following to assist with her diuresis.  She had rising creatinine and decreased urine output despite high doses of diuretic therapy.  They are assisting [place] with this.  Hopefully we can avoid any hemodialysis.  Clearly we will have to wait on cardiac catheterization and other things until we can get volume off of her.  I am hopeful that her urine output will improve.  2.Atrial fibrillation with RVR, paroxysmal -TSH 4.6, but T4 is within normal limits -Unclear if this is the cause of her cardiomyopathy or if she developed heart failure first and then A. Fib -Appears to be mainly in sinus rhythm now and I have transitioned her to metoprolol succinate 200 mg daily for heart failure  -We will complete a full amiodarone load to help keep her in sinus rhythm (400 mg twice daily for 7 days and then 200 mg daily for 21 days and then stop) -We will continue heparin drip for now as I expect she may end up needing a dialysis catheter  We will transition to a DOAC when we are able to.  I also would like to at some point consider cardiac catheterization however her kidney function precludes this currently   3. AKI -Staghorn renal calculi noted as well as uterine fibroid which is large -Nephrology following for assistance with diuretics -The staghorn renal calculi will require frequent monitoring for hydronephrosis  For questions or updates, please contact [place] Please consult [email or web address] for contact info under   Signed, Lake Bells T. Audie Box, [place]  10/26/2019 2:04 PM

## 2019-10-26 NOTE — Progress Notes (Signed)
ANTICOAGULATION CONSULT NOTE - Follow Up Consult  Pharmacy Consult for Heparin Indication: atrial fibrillation  No Known Allergies  Patient Measurements: Height: 5\' 8"  (172.7 cm) Weight: (!) 333 lb 12.4 oz (151.4 kg) IBW/kg (Calculated) : 63.9 Heparin Dosing Weight:   Vital Signs: Temp: 98.2 F (36.8 C) (11/06 0115) Temp Source: Oral (11/06 0115) BP: 128/71 (11/06 0115) Pulse Rate: 80 (11/06 0115)  Labs: Recent Labs    10/23/19 0424 10/24/19 0410 10/25/19 0441 10/25/19 1859 10/26/19 0112  HGB 10.5* 9.7* 10.2*  --   --   HCT 34.4* 32.3* 34.3*  --   --   PLT 197 228 177  --   --   HEPARINUNFRC 0.42 0.44 0.28* 0.42 0.36  CREATININE 2.24* 2.39* 2.65*  --   --     Estimated Creatinine Clearance: 39.2 mL/min (A) (by C-G formula based on SCr of 2.65 mg/dL (H)).   Medications:  Infusions:  . albumin human 12.5 g (10/26/19 0134)  . furosemide 160 mg (10/25/19 1907)  . heparin 1,850 Units/hr (10/25/19 1454)    Assessment: Patient with heparin level at goal.  No heparin issues noted.  Goal of Therapy:  Heparin level 0.3-0.7 units/ml Monitor platelets by anticoagulation protocol: Yes   Plan:  Continue heparin drip at current rate Recheck level with AM labs  Tyler Deis, Shea Stakes [place] 10/26/2019,3:26 AM

## 2019-10-27 DIAGNOSIS — I5020 Unspecified systolic (congestive) heart failure: Secondary | ICD-10-CM

## 2019-10-27 LAB — RENAL FUNCTION PANEL
Albumin: 3 g/dL — ABNORMAL LOW (ref 3.5–5.0)
Albumin: 2.9 g/dL — ABNORMAL LOW (ref 3.5–5.0)
Anion gap: 12 (ref 5–15)
Anion gap: 12 (ref 5–15)
BUN: 57 mg/dL — ABNORMAL HIGH (ref 6–20)
BUN: 55 mg/dL — ABNORMAL HIGH (ref 6–20)
CO2: 21 mmol/L — ABNORMAL LOW (ref 22–32)
CO2: 23 mmol/L (ref 22–32)
Calcium: 8.8 mg/dL — ABNORMAL LOW (ref 8.9–10.3)
Calcium: 8.7 mg/dL — ABNORMAL LOW (ref 8.9–10.3)
Chloride: 105 mmol/L (ref 98–111)
Chloride: 104 mmol/L (ref 98–111)
Creatinine, Ser: 2.87 mg/dL — ABNORMAL HIGH (ref 0.44–1.00)
Creatinine, Ser: 2.89 mg/dL — ABNORMAL HIGH (ref 0.44–1.00)
GFR calc Af Amer: 21 mL/min — ABNORMAL LOW (ref >60)
GFR calc Af Amer: 21 mL/min — ABNORMAL LOW (ref >60)
GFR calc non Af Amer: 18 mL/min — ABNORMAL LOW (ref >60)
GFR calc non Af Amer: 18 mL/min — ABNORMAL LOW (ref >60)
Glucose, Bld: 214 mg/dL — ABNORMAL HIGH (ref 70–99)
Glucose, Bld: 197 mg/dL — ABNORMAL HIGH (ref 70–99)
Phosphorus: 4.9 mg/dL — ABNORMAL HIGH (ref 2.5–4.6)
Phosphorus: 5 mg/dL — ABNORMAL HIGH (ref 2.5–4.6)
Potassium: 3.9 mmol/L (ref 3.5–5.1)
Potassium: 4 mmol/L (ref 3.5–5.1)
Sodium: 138 mmol/L (ref 135–145)
Sodium: 139 mmol/L (ref 135–145)

## 2019-10-27 LAB — CBC
HCT: 30.9 % — ABNORMAL LOW (ref 36.0–46.0)
Hemoglobin: 9.3 g/dL — ABNORMAL LOW (ref 12.0–15.0)
MCH: 24.8 pg — ABNORMAL LOW (ref 26.0–34.0)
MCHC: 30.1 g/dL (ref 30.0–36.0)
MCV: 82.4 fL (ref 80.0–100.0)
Platelets: 183 10*3/uL (ref 150–400)
RBC: 3.75 MIL/uL — ABNORMAL LOW (ref 3.87–5.11)
RDW: 19.2 % — ABNORMAL HIGH (ref 11.5–15.5)
WBC: 5.2 10*3/uL (ref 4.0–10.5)
nRBC: 0.6 % — ABNORMAL HIGH (ref 0.0–0.2)

## 2019-10-27 LAB — GLUCOSE, CAPILLARY
Glucose-Capillary: 182 mg/dL — ABNORMAL HIGH (ref 70–99)
Glucose-Capillary: 279 mg/dL — ABNORMAL HIGH (ref 70–99)
Glucose-Capillary: 226 mg/dL — ABNORMAL HIGH (ref 70–99)
Glucose-Capillary: 201 mg/dL — ABNORMAL HIGH (ref 70–99)

## 2019-10-27 LAB — PROTEIN / CREATININE RATIO, URINE
Creatinine, Urine: 37.65 mg/dL
Protein Creatinine Ratio: 1.07 mg/mg{Cre} — ABNORMAL HIGH (ref 0.00–0.15)
Total Protein, Urine: 41 mg/dL

## 2019-10-27 LAB — COMPLEMENT, TOTAL: Compl, Total (CH50): >60 U/mL (ref >41)

## 2019-10-27 LAB — SODIUM, URINE, RANDOM: Sodium, Ur: 87 mmol/L

## 2019-10-27 LAB — CREATININE, URINE, RANDOM: Creatinine, Urine: 39.62 mg/dL

## 2019-10-27 LAB — HEPARIN LEVEL (UNFRACTIONATED): Heparin Unfractionated: 0.41 IU/mL (ref 0.30–0.70)

## 2019-10-27 LAB — MAGNESIUM: Magnesium: 1.9 mg/dL (ref 1.7–2.4)

## 2019-10-27 MED ORDER — ALBUMIN HUMAN 25 % IV SOLN
25.0000 g | Freq: Four times a day (QID) | INTRAVENOUS | Status: DC
  Administered 2019-10-27 – 2019-10-28 (×5): 25 g via INTRAVENOUS
  Filled 2019-10-27 (×6): qty 100

## 2019-10-27 MED ORDER — METOPROLOL TARTRATE 50 MG PO TABS
50.0000 mg | ORAL_TABLET | Freq: Two times a day (BID) | ORAL | Status: DC
  Administered 2019-10-27 – 2019-10-28 (×3): 50 mg via ORAL
  Filled 2019-10-27 (×3): qty 1

## 2019-10-27 MED ORDER — METOLAZONE 5 MG PO TABS
5.0000 mg | ORAL_TABLET | Freq: Two times a day (BID) | ORAL | Status: DC
  Administered 2019-10-27 (×2): 5 mg via ORAL
  Filled 2019-10-27 (×3): qty 1

## 2019-10-27 MED ORDER — FUROSEMIDE 10 MG/ML IJ SOLN
160.0000 mg | Freq: Three times a day (TID) | INTRAVENOUS | Status: DC
  Administered 2019-10-28 (×2): 160 mg via INTRAVENOUS
  Filled 2019-10-27 (×3): qty 16

## 2019-10-27 NOTE — Progress Notes (Addendum)
MD paged about close administration of lasix 160mg  @ [place] and ordered again for 2230. Awaiting response. MD responded and followed orders by neurologist

## 2019-10-27 NOTE — Progress Notes (Addendum)
Patient ID: Adrienne Lane, female   DOB: [birth year], 53 y.o.   MRN: [id number] S: No new complaints.    O:BP 100/80 (BP Location: Right Arm)   Pulse 68   Temp 97.8 F (36.6 C) (Oral)   Resp 16   Ht 5\' 8"  (1.727 m)   Wt (!) 152.8 kg   LMP 10/20/2019 (Approximate)   SpO2 98%   BMI 51.21 kg/m   Intake/Output Summary (Last 24 hours) at 10/27/2019 2207 Last data filed at 10/27/2019 1950 Gross per 24 hour  Intake 1096 ml  Output 1100 ml  Net -4 ml   Intake/Output: I/O last 3 completed shifts: In: 1978 [P.O.:1200; I.V.:313; IV Piggyback:465] Out: 1950 [Urine:1950]  Intake/Output this shift:  Total I/O In: -  Out: 200 [Urine:200] Weight change: 1.8 kg Gen: morbidly obese AAF in NAD seen in bed in room CVS: no rub Resp: decreased BS Abd: obese, +BS, soft, NT Ext: 2+ edema  Recent Labs  Lab 10/22/19 0925 10/22/19 1716 10/23/19 0424 10/24/19 0410 10/25/19 0441 10/26/19 0434 10/27/19 0411 10/27/19 0545  NA 138 138 137 139 137 138 138 139  K 3.4* 4.3 4.6 3.8 4.3 4.4 3.9 4.0  CL 104 106 106 106 103 103 105 104  CO2 23 23 20* 22 20* 22 21* 23  GLUCOSE 226* 251* 221* 229* 213* 213* 214* 197*  BUN 37* 36* 42* 46* 49* 51* 57* 55*  CREATININE 1.98* 2.08* 2.24* 2.39* 2.65* 2.86* 2.87* 2.89*  ALBUMIN 2.8*  --   --  2.4* 2.7* 2.9* 3.0* 2.9*  CALCIUM 8.8* 8.9 8.6* 8.4* 8.7* 8.9 8.8* 8.7*  PHOS  --   --   --  3.9 4.3 4.6 4.9* 5.0*  AST 23  --   --   --   --   --   --   --   ALT 22  --   --   --   --   --   --   --    Liver Function Tests: Recent Labs  Lab 10/22/19 0925  10/26/19 0434 10/27/19 0411 10/27/19 0545  AST 23  --   --   --   --   ALT 22  --   --   --   --   ALKPHOS 69  --   --   --   --   BILITOT 1.2  --   --   --   --   PROT 6.5  --   --   --   --   ALBUMIN 2.8*   < > 2.9* 3.0* 2.9*   < > = values in this interval not displayed.   No results for input(s): LIPASE, AMYLASE in the last 168 hours. No results for input(s): AMMONIA in the last 168  hours. CBC: Recent Labs  Lab 10/23/19 0424 10/24/19 0410 10/25/19 0441 10/26/19 0434 10/27/19 0411  WBC 6.1 5.8 5.8 6.0 5.2  NEUTROABS  --   --   --  3.7  --   HGB 10.5* 9.7* 10.2* 9.9* 9.3*  HCT 34.4* 32.3* 34.3* 33.0* 30.9*  MCV 79.6* 79.6* 80.7 80.9 82.4  PLT 197 228 177 227 183   Cardiac Enzymes: No results for input(s): CKTOTAL, CKMB, CKMBINDEX, TROPONINI in the last 168 hours. CBG: Recent Labs  Lab 10/26/19 2134 10/27/19 0745 10/27/19 1431 10/27/19 1613 10/27/19 2146  GLUCAP 282* 182* 279* 226* 201*    Iron Studies: No results for input(s): IRON, TIBC, TRANSFERRIN, FERRITIN in the last 72  hours. Studies/Results: No results found. Marland Kitchen amiodarone  400 mg Oral BID   Followed by  . [START ON 10/31/2019] amiodarone  200 mg Oral Daily  . insulin aspart  0-5 Units Subcutaneous QHS  . insulin aspart  0-9 Units Subcutaneous TID WC  . insulin glargine  12 Units Subcutaneous Daily  . isosorbide-hydrALAZINE  2 tablet Oral TID  . metolazone  5 mg Oral BID  . metoprolol succinate  200 mg Oral Daily    BMET    Component Value Date/Time   NA 139 10/27/2019 0545   K 4.0 10/27/2019 0545   CL 104 10/27/2019 0545   CO2 23 10/27/2019 0545   GLUCOSE 197 (H) 10/27/2019 0545   BUN 55 (H) 10/27/2019 0545   CREATININE 2.89 (H) 10/27/2019 0545   CALCIUM 8.7 (L) 10/27/2019 0545   GFRNONAA 18 (L) 10/27/2019 0545   GFRAA 21 (L) 10/27/2019 0545   CBC    Component Value Date/Time   WBC 5.2 10/27/2019 0411   RBC 3.75 (L) 10/27/2019 0411   HGB 9.3 (L) 10/27/2019 0411   HCT 30.9 (L) 10/27/2019 0411   PLT 183 10/27/2019 0411   MCV 82.4 10/27/2019 0411   MCH 24.8 (L) 10/27/2019 0411   MCHC 30.1 10/27/2019 0411   RDW 19.2 (H) 10/27/2019 0411   LYMPHSABS 1.4 10/26/2019 0434   MONOABS 0.8 10/26/2019 0434   EOSABS 0.1 10/26/2019 0434   BASOSABS 0.1 10/26/2019 0434     Assessment/Plan: 1.  AKI/CKD- in setting of large, bilateral staghorn calculi, anasarca, atrial fibrillation  with RVR, and new systolic CHF.  Her lasix was held due to rising Scr and we were consulted to help assist with diuresis.  She denies an history of CKD but knew about her calculi.  She also has hypoalbuminemia, anemia, and proteinuria 1. ANCA's are both slightly up, ANA pend, anti-DNA neg, ASO neg, C3/C4 wnl,  SPEP and serum FLC are negative,  2. 24 hour urine to quantify proteinuria pending, urine PC ratio = 1.0 3. Creat stable today 2.8 range 4. Will get f/u renal US tomorrow , make sure no hydro 2. HTN - bp's soft, will dc bidil , cont metoprolol at lower dose w/ hold order 3. New onset, acute systolic CHF with anasarca- cardiology following.  We resumed IV lasix at highest dose 160 bid IV w/ IV albumin. Diuresing some but wt's are not coming down.  Will ^ lasix 160 tid.  4. Atrial fibrillation- now rate controlled with amiodarone.  Currently on heparin drip 5. Bilateral staghorn calculi- evaluated by Urology.  No evidence of hydronephrosis so no indication for nephrostomy tubes 6. DM- poorly controlled with Hgb A1c 11.7%.  per primary 7. Right hip pain s/p mechanical fall- no fx 8. Anemia- normocytic. Low iron will replete and follow 9. Uterine mass- is a large fibroid no change from CT in 2019  Kelly Splinter, MD 10/27/2019, 10:09 PM

## 2019-10-27 NOTE — Progress Notes (Signed)
ANTICOAGULATION CONSULT NOTE  Pharmacy Consult for IV heparin Indication: atrial fibrillation  No Known Allergies  Patient Measurements: Height: 5\' 8"  (172.7 cm) Weight: (!) 336 lb 12.8 oz (152.8 kg) IBW/kg (Calculated) : 63.9 Heparin Dosing Weight: 100.6 kg  Vital Signs: Temp: 97.9 F (36.6 C) (11/07 0443) Temp Source: Oral (11/07 0443) BP: 107/70 (11/07 0443) Pulse Rate: 66 (11/07 0443)  Labs: Recent Labs    10/25/19 0441  10/26/19 0112 10/26/19 0434 10/27/19 0411 10/27/19 0545  HGB 10.2*  --   --  9.9* 9.3*  --   HCT 34.3*  --   --  33.0* 30.9*  --   PLT 177  --   --  227 183  --   HEPARINUNFRC 0.28*   < > 0.36 0.37 0.41  --   CREATININE 2.65*  --   --  2.86* 2.87* 2.89*   < > = values in this interval not displayed.    Estimated Creatinine Clearance: 36.2 mL/min (A) (by C-G formula based on SCr of 2.89 mg/dL (H)).   Assessment: 36 yoF with PMH DM2, HTN, presents with R hip pain after falling. Also reports several months of LE edema, orthopnea, and abdominal distention. Found to have elevated BNP and to also be in AFib with RVR. Pharmacy to dose heparin   Baseline INR, aPTT: elevated; charted as being drawn before heparin started, but RN notes report drawing aPTT/INR while heparin running  Prior anticoagulation: none  10/27/2019  heparin level 0.41 is therapeutic on heparin  at 1850 units/hr CBC stable, no bleeding reported,   Goal of Therapy: Heparin level 0.3-0.7 units/ml Monitor platelets by anticoagulation protocol: Yes  Plan:  continue heparin drip at 1850 units/hr  Daily CBC, daily heparin level   Monitor for signs of bleeding or thrombosis  Issues with coverage after moving to ; recommend CM consult to assist in determining preferred oral anticoagulant   Eudelia Bunch, Pharm.D [phone] 10/27/2019 10:13 AM

## 2019-10-27 NOTE — Progress Notes (Signed)
PROGRESS NOTE  Adrienne Lane WNU:[id number] DOB: May 08, 1966 DOA: 10/20/2019 PCP: Patient, No Pcp Per   LOS: 7 days   Brief Narrative / Interim history: Patient is a 53 year old African-American female, morbidly obese, with past medical history significant for diabetes mellitus type 2, hypertension, uterine fibroids, chronic leg edema/anasarca and query history of atrial fibrillation not on any medication.  Patient was admitted with right-sided hip pain following a fall.  Patient has not seen a provider for the last 1 to 2 years due to lack of insurance, and has not been compliant with his medications and insulin.  Patient developed progressive leg edema with increased abdominal girth over the last 4 months.  On presentation to the hospital, patient was found to be in atrial fibrillation with rapid ventricular response.  Echocardiogram done revealed EF of 20-25 %, global hypokinesis and RVSP of 58.3 mmHg.  Cardiology input is appreciated.  Significantly, despite severe anasarca, patient is not on oxygen.  Worsening renal function is reported.  UA reveals protein, few bacteria, greater than 50 RBC and greater than 50 WBC.  Renal ultrasound is negative for obstruction, better revealed bilateral staghorn calculi.  Albumin is low.  Nephrology team has also been consulted.  Patient will be started on IV albumin and Lasix.  For GN work-up as per nephrology.  Further management will depend on hospital course.  10/26/2019: Patient seen.  Input from nephrology and cardiology is appreciated.  Urine output noted, however, not sure about the accuracy.  Serum creatinine is 2.86 today.  We will add 3-day course of metolazone.  Continue to monitor renal function and electrolytes closely.  Further history from the patient indicated that she may obtain 40 to 60 pounds over the last 4 months.  10/27/2019: Patient seen.  Patient reports improved urine output.  We will continue current regimen.  UPC reveals approximately 1  g proteinuria.  2.87 today (stable).  Follow-up pending work-up.  Subjective: No shortness of breath at rest. Reports some improvement in urine output.  Assessment & Plan: Principal Problem:   Atrial fibrillation with rapid ventricular response (HCC) Active Problems:   CHF (congestive heart failure) (HCC)   Elevated troponin   AKI (acute kidney injury) ([place])   Hyperglycemia   Atrial fibrillation with RVR (HCC)   Right hip pain   Type 2 diabetes mellitus without complication (HCC)   Essential hypertension   Uterine fibroid   A. fib with RVR: -Heart rate is controlled.   -Continue anticoagulation with heparin drip.   -Significantly low EF noted on echocardiogram could be secondary to tachycardia cardiomyopathy.  -Cardiology input is appreciated.  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 -Etiology of progressive edema/anasarca likely multifactorial. -Cardiology input is appreciated. -Baseline EF is not known.  Echocardiogram done on 10/21/2019 revealed EF of 20 to 25%.  Cannot entirely rule out tachycardia induced cardiomyopathy (patient was on rapid A. fib on presentation)  Acute kidney injury: -Patient's creatinine in 2019 was 0.9-1.0 range with labs done at [facility]. -Creatinine this admission 1.7, with diuresis gradually increasing and now it is 2.4.  Hold further Lasix. -Renal ultrasound negative for hydronephrosis  -Bilateral staghorn calculi noted. -Nephrology team has been consulted.  For possible GN work-up. 10/26/2019: Add 3-day course of metolazone.  Monitor renal function and electrolytes while on metolazone.  Continue to monitor urine output.  Serum creatinine today is 2.86.  LDL is 62. 10/27/2019: Serum creatinine seems to have peaked.  Continue current regimen.  Follow work-up.  Nephrology team is  directing.  Bilateral staghorn renal calculi -Urology consulted, no need for interventions at this point.    Type 2 diabetes mellitus: -Poorly controlled, with hyperglycemia.  -Continue sliding scale and Lantus -Continue to optimize.  Right hip pain secondary to mechanical fall -Negative imaging for fractures  Normocytic anemia -Chronic disease  Uterine mass -CT scan of the abdomen and pelvis showed marked enlargement of the uterine fundus measuring up to 17 cm with heterogeneous attenuation and irregular curvilinear air lucency towards the superior aspect of this masslike and large amount.  This is concerning for endometrial carcinoma. -Patient apparently is aware of having uterine mass, she was told last year at [facility] that she is having uterine fibroids -Gynecology team consulted.  For further management on outpatient basis.  Scheduled Meds: . amiodarone  400 mg Oral BID   Followed by  . [START ON 10/31/2019] amiodarone  200 mg Oral Daily  . insulin aspart  0-5 Units Subcutaneous QHS  . insulin aspart  0-9 Units Subcutaneous TID WC  . insulin glargine  12 Units Subcutaneous Daily  . isosorbide-hydrALAZINE  2 tablet Oral TID  . metolazone  5 mg Oral BID  . metoprolol succinate  200 mg Oral Daily   Continuous Infusions: . albumin human 25 g (10/27/19 1154)  . ferumoxytol 510 mg (10/26/19 1328)  . furosemide 160 mg (10/27/19 1020)  . heparin 1,850 Units/hr (10/27/19 0743)   PRN Meds:.acetaminophen **OR** acetaminophen, ondansetron (ZOFRAN) IV  DVT prophylaxis: heparin Code Status: Full code Family Communication:  Disposition Plan: This will depend on hospital course.  Consultants:  Cardiology  Urology  Nephrology  Procedures:   2D echo 10/21/2019  Plain films of the right hip and pelvis 10/20/2019  CXR 10/20/2019  Renal ultrasound 10/22/2019  Microbiology  None   Antimicrobials: None     Objective: Vitals:   10/26/19 1315 10/26/19 2138 10/27/19  0443 10/27/19 0701  BP: (!) 117/59 108/73 107/70   Pulse: 74 73 66   Resp: 20 20 20    Temp: 97.9 F (36.6 C) 98.7 F (37.1 C) 97.9 F (36.6 C)   TempSrc:  Oral Oral   SpO2: 100% 97% 94%   Weight:   (!) 153.8 kg (!) 152.8 kg  Height:        Intake/Output Summary (Last 24 hours) at 10/27/2019 1306 Last data filed at 10/27/2019 0950 Gross per 24 hour  Intake 707.97 ml  Output 1600 ml  Net -892.03 ml   Filed Weights   10/26/19 0610 10/27/19 0443 10/27/19 0701  Weight: (!) 152 kg (!) 153.8 kg (!) 152.8 kg    Examination:  Constitutional: Patient is morbidly obese.  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   Eyes: Pallor.  No jaundice.  ENMT: Mucous membranes are moist.  JVD is equivocal. Respiratory: clear to auscultation bilaterally Cardiovascular: S1-S2, irregular.   Abdomen: Mildly obese, pitting edema, organs are difficult to assess. Musculoskeletal: 2+ to 3 lower extremity edema/Anasarca.  Abdominal wall edema. Neurologic: Awake and alert.  Patient moves all extremities.  Data Reviewed: I have independently reviewed following labs and imaging studies   CBC: Recent Labs  Lab 10/20/19 1958  10/23/19 0424 10/24/19 0410 10/25/19 0441 10/26/19 0434 10/27/19 0411  WBC 6.2   < > 6.1 5.8 5.8 6.0 5.2  NEUTROABS 4.3  --   --   --   --  3.7  --   HGB 11.9*   < > 10.5* 9.7* 10.2* 9.9* 9.3*  HCT 39.7   < > 34.4* 32.3* 34.3* 33.0* 30.9*  MCV 80.5   < > 79.6* 79.6* 80.7 80.9 82.4  PLT PLATELET CLUMPS NOTED ON SMEAR, UNABLE TO ESTIMATE   < > 197 228 177 227 183   < > = values in this interval not displayed.   Basic Metabolic Panel: Recent Labs  Lab 10/20/19 2028  10/22/19 0925  10/23/19 0424 10/24/19 0410 10/25/19 0441 10/26/19 0434 10/27/19 0411 10/27/19 0527 10/27/19 0545  NA  --    < > 138   < > 137 139 137 138 138  --  139  K  --    < > 3.4*   < > 4.6 3.8 4.3 4.4 3.9  --  4.0  CL  --    < > 104   < > 106 106 103 103 105  --  104  CO2  --    < > 23   < > 20* 22 20* 22  21*  --  23  GLUCOSE  --    < > 226*   < > 221* 229* 213* 213* 214*  --  197*  BUN  --    < > 37*   < > 42* 46* 49* 51* 57*  --  55*  CREATININE  --    < > 1.98*   < > 2.24* 2.39* 2.65* 2.86* 2.87*  --  2.89*  CALCIUM  --    < > 8.8*   < > 8.6* 8.4* 8.7* 8.9 8.8*  --  8.7*  MG 2.1  --  1.8  --  2.0  --   --  2.2  --  1.9  --   PHOS  --   --   --   --   --  3.9 4.3 4.6 4.9*  --  5.0*   < > = values in this interval not displayed.   GFR: Estimated Creatinine Clearance: 36.2 mL/min (A) (by C-G formula based on SCr of 2.89 mg/dL (H)). Liver Function Tests: Recent Labs  Lab 10/22/19 0925 10/24/19 0410 10/25/19 0441 10/26/19 0434 10/27/19 0411 10/27/19 0545  AST 23  --   --   --   --   --   ALT 22  --   --   --   --   --   ALKPHOS 69  --   --   --   --   --   BILITOT 1.2  --   --   --   --   --   PROT 6.5  --   --   --   --   --   ALBUMIN 2.8* 2.4* 2.7* 2.9* 3.0* 2.9*   No results for input(s): LIPASE, AMYLASE in the last 168 hours. No results for input(s): AMMONIA in the last 168 hours. Coagulation Profile: Recent Labs  Lab 10/20/19 2304 10/21/19 0710  INR 1.5* 1.4*   Cardiac Enzymes: No results for input(s): CKTOTAL, CKMB, CKMBINDEX, TROPONINI in the last 168 hours. BNP (last 3 results) No results for input(s): PROBNP in the last 8760 hours. HbA1C: No results for input(s): HGBA1C in the last 72 hours. CBG: Recent Labs  Lab 10/26/19 0740 10/26/19 1202 10/26/19 1604 10/26/19 2134 10/27/19 0745  GLUCAP 227* 235* 257* 282* 182*   Lipid Profile: Recent Labs    10/26/19 1020  CHOL 122  HDL 49  LDLCALC 62  TRIG 57  CHOLHDL 2.5   Thyroid Function Tests: No results for input(s): TSH, T4TOTAL, FREET4, T3FREE, THYROIDAB in the last 72 hours. Anemia Panel: No results for input(s): VITAMINB12, FOLATE, FERRITIN, TIBC, IRON, RETICCTPCT in the last 72 hours. Urine analysis:    Component Value Date/Time   COLORURINE YELLOW 10/23/2019 1151   APPEARANCEUR CLOUDY (A)  10/23/2019 1151   LABSPEC 1.006 10/23/2019 1151   PHURINE 7.0 10/23/2019 1151   GLUCOSEU NEGATIVE 10/23/2019 1151   HGBUR MODERATE (A) 10/23/2019 1151   BILIRUBINUR NEGATIVE 10/23/2019 1151   KETONESUR NEGATIVE 10/23/2019 1151   PROTEINUR 100 (A) 10/23/2019 1151   NITRITE NEGATIVE 10/23/2019 1151   LEUKOCYTESUR LARGE (A) 10/23/2019 1151   Sepsis Labs: Invalid input(s): PROCALCITONIN, LACTICIDVEN  Recent Results (from the past 240 hour(s))  SARS CORONAVIRUS 2 (TAT 6-24 HRS) Nasopharyngeal Nasopharyngeal Swab     Status: None   Collection Time: 10/20/19 10:45 PM   Specimen: Nasopharyngeal Swab  Result Value Ref Range Status   SARS Coronavirus 2 NEGATIVE NEGATIVE Final    Comment: (NOTE) SARS-CoV-2 target nucleic acids are NOT DETECTED. The SARS-CoV-2 RNA is generally detectable in upper and lower respiratory specimens during the acute phase of infection. Negative results do not preclude SARS-CoV-2 infection, do not rule out co-infections with other pathogens, and should not be used as the sole basis for treatment or other patient management decisions. Negative results must be combined with clinical observations, patient history, and epidemiological information. The expected result is Negative. Fact Sheet for Patients: SugarRoll.be Fact Sheet for Healthcare Providers: https://www.woods-mathews.com/ This test is not yet approved or cleared by the [place] FDA and  has been authorized for detection and/or diagnosis of SARS-CoV-2 by FDA under an Emergency Use Authorization (EUA). This EUA will remain  in effect (meaning this test can be used) for the duration of the COVID-19 declaration under Section 56 4(b)(1) of the Act, 21 U.S.C. section 360bbb-3(b)(1), unless the authorization is terminated or revoked sooner. Performed at [facility], [place] [address], [place], [place] [address]   Culture, Urine     Status: Abnormal    Collection Time: 10/23/19 11:51 AM   Specimen: Urine, Catheterized  Result Value Ref Range Status   Specimen Description   Final    URINE, CATHETERIZED Performed at [facility] [address], [place], [place] [address]    Special Requests   Final    NONE Performed at [facility], [place] [address] [place], [place] [address]    Culture MULTIPLE SPECIES PRESENT, SUGGEST RECOLLECTION (A)  Final   Report Status 10/24/2019 FINAL  Final      Radiology Studies: No results found. Dana Allan MD. [facility]  Contact via  [email or web address]  [place] P: [phone] F: [phone]

## 2019-10-27 NOTE — Progress Notes (Signed)
Cardiology Progress Note  Patient ID: Adrienne Lane MRN: [id number] DOB: 19-Sep-1966 Date of Encounter: 10/27/2019  Primary Cardiologist: Donato Heinz, MD  Subjective   No complaints laying flat in bed  ROS:  All other ROS reviewed and negative. Pertinent positives noted in the HPI.     Inpatient Medications  Scheduled Meds: . amiodarone  400 mg Oral BID   Followed by  . [START ON 10/31/2019] amiodarone  200 mg Oral Daily  . insulin aspart  0-5 Units Subcutaneous QHS  . insulin aspart  0-9 Units Subcutaneous TID WC  . insulin glargine  12 Units Subcutaneous Daily  . isosorbide-hydrALAZINE  2 tablet Oral TID  . metolazone  5 mg Oral BID  . metoprolol succinate  200 mg Oral Daily   Continuous Infusions: . albumin human    . ferumoxytol 510 mg (10/26/19 1328)  . furosemide 160 mg (10/26/19 1911)  . heparin 1,850 Units/hr (10/26/19 1826)   PRN Meds: acetaminophen **OR** acetaminophen, ondansetron (ZOFRAN) IV   Vital Signs   Vitals:   10/26/19 1315 10/26/19 2138 10/27/19 0443 10/27/19 0701  BP: (!) 117/59 108/73 107/70   Pulse: 74 73 66   Resp: 20 20 20    Temp: 97.9 F (36.6 C) 98.7 F (37.1 C) 97.9 F (36.6 C)   TempSrc:  Oral Oral   SpO2: 100% 97% 94%   Weight:   (!) 153.8 kg (!) 152.8 kg  Height:        Intake/Output Summary (Last 24 hours) at 10/27/2019 0743 Last data filed at 10/27/2019 0504 Gross per 24 hour  Intake 1187.97 ml  Output 1650 ml  Net -462.03 ml   Last 3 Weights 10/27/2019 10/27/2019 10/26/2019  Weight (lbs) 336 lb 12.8 oz 339 lb 1.1 oz 335 lb 1.6 oz  Weight (kg) 152.771 kg 153.8 kg 152 kg      Telemetry   NSR occasional PVC;s   ECG  The most recent ECG shows sinus rhythm with paroxysmal A. fib and PVCs, which I personally reviewed.   Physical Exam   Vitals:   10/26/19 1315 10/26/19 2138 10/27/19 0443 10/27/19 0701  BP: (!) 117/59 108/73 107/70   Pulse: 74 73 66   Resp: 20 20 20    Temp: 97.9 F (36.6 C) 98.7 F (37.1  C) 97.9 F (36.6 C)   TempSrc:  Oral Oral   SpO2: 100% 97% 94%   Weight:   (!) 153.8 kg (!) 152.8 kg  Height:         Intake/Output Summary (Last 24 hours) at 10/27/2019 0743 Last data filed at 10/27/2019 0504 Gross per 24 hour  Intake 1187.97 ml  Output 1650 ml  Net -462.03 ml    Last 3 Weights 10/27/2019 10/27/2019 10/26/2019  Weight (lbs) 336 lb 12.8 oz 339 lb 1.1 oz 335 lb 1.6 oz  Weight (kg) 152.771 kg 153.8 kg 152 kg    Body mass index is 51.21 kg/m.   Affect appropriate Obese black female  HEENT: normal Neck supple with no adenopathy JVP normal no bruits no thyromegaly Lungs clear with no wheezing and good diaphragmatic motion Heart:  S1/S2 no murmur, no rub, gallop or click PMI normal Abdomen: benighn, BS positve, no tenderness, no AAA no bruit.  No HSM or HJR Distal pulses intact with no bruits No edema Neuro non-focal Skin warm and dry No muscular weakness   Labs  High Sensitivity Troponin:   Recent Labs  Lab 10/20/19 2028 10/20/19 2245  TROPONINIHS 103* 83*  Cardiac EnzymesNo results for input(s): TROPONINI in the last 168 hours. No results for input(s): TROPIPOC in the last 168 hours.  Chemistry Recent Labs  Lab 10/22/19 0925  10/26/19 0434 10/27/19 0411 10/27/19 0545  NA 138   < > 138 138 139  K 3.4*   < > 4.4 3.9 4.0  CL 104   < > 103 105 104  CO2 23   < > 22 21* 23  GLUCOSE 226*   < > 213* 214* 197*  BUN 37*   < > 51* 57* 55*  CREATININE 1.98*   < > 2.86* 2.87* 2.89*  CALCIUM 8.8*   < > 8.9 8.8* 8.7*  PROT 6.5  --   --   --   --   ALBUMIN 2.8*   < > 2.9* 3.0* 2.9*  AST 23  --   --   --   --   ALT 22  --   --   --   --   ALKPHOS 69  --   --   --   --   BILITOT 1.2  --   --   --   --   GFRNONAA 29*   < > 18* 18* 18*  GFRAA 33*   < > 21* 21* 21*  ANIONGAP 11   < > 13 12 12    < > = values in this interval not displayed.    Hematology Recent Labs  Lab 10/25/19 0441 10/26/19 0434 10/27/19 0411  WBC 5.8 6.0 5.2  RBC 4.25 4.08  3.75*  HGB 10.2* 9.9* 9.3*  HCT 34.3* 33.0* 30.9*  MCV 80.7 80.9 82.4  MCH 24.0* 24.3* 24.8*  MCHC 29.7* 30.0 30.1  RDW 19.0* 18.9* 19.2*  PLT 177 227 183   BNP Recent Labs  Lab 10/20/19 2029  BNP 1,623.1*    DDimer No results for input(s): DDIMER in the last 168 hours.   Radiology  No results found.  Cardiac Studies  TTE 10/21/2019 1. Left ventricular ejection fraction, by visual estimation, is 20 to 25%. The left ventricle has severely decreased function. There is mildly increased left ventricular hypertrophy.  2. Left ventricular diastolic parameters are indeterminate.  3. The left ventricle demonstrates global hypokinesis.  4. Global right ventricle has normal systolic function.The right ventricular size is mildly enlarged. No increase in right ventricular wall thickness.  5. Left atrial size was moderately dilated.  6. Right atrial size was moderately dilated.  7. The pericardial effusion is circumferential.  8. Trivial pericardial effusion is present.  9. The mitral valve is normal in structure. Mild to moderate mitral valve regurgitation. No evidence of mitral stenosis. 10. The tricuspid valve is normal in structure. Tricuspid valve regurgitation moderate-severe. 11. The aortic valve is normal in structure. Aortic valve regurgitation is not visualized. No evidence of aortic valve sclerosis or stenosis. 12. The pulmonic valve was normal in structure. Pulmonic valve regurgitation is not visualized. 13. Moderately elevated pulmonary artery systolic pressure. 14. The tricuspid regurgitant velocity is 3.29 m/s, and with an assumed right atrial pressure of 15 mmHg, the estimated right ventricular systolic pressure is moderately elevated at 58.3 mmHg. 15. The inferior vena cava is dilated in size with <50% respiratory variability, suggesting right atrial pressure of 15 mmHg.  CT A/P  1. Imaging quality is significantly degraded due to patient body habitus with resulting  central photon starvation. Evaluation is furthermore complicated by lack of intravenous contrast material. 2. Marked enlargement of the uterine fundus measuring up  to 17 cm in size, with heterogeneous attenuation and a an irregular curvilinear air lucency towards the superior aspect of this masslike enlargement. 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 Recommend further evaluation with pelvic ultrasound. 3. Extensive bilateral staghorn calculi. No visible hydronephrosis. Difficult to follow the course of either ureter given lack of contrast and fluid extensive fluid in the abdomen. 4. Severe anasarca with extensive body wall edema, mesenteric congestion, ascites, and right pleural effusion. 5. Circumferential thickening of the urinary bladder. Correlate with urinalysis to exclude cystitis. 6. Marked cardiomegaly with four-chamber enlargement. 7. Regions of mosaic attenuation in the lower lungs may reflect areas of air trapping versus imaging during exhalation. 8. Aortic Atherosclerosis (ICD10-I70.0).   Patient Profile  Adrienne Lane a 53 y.o.femalewith history of diabetes(A1c 11.1),CKD, obesity who was admitted on 11/1 for new onset systolic heart failure as well as atrial fibrillation with RVR. This is a new diagnosis for her.  Assessment & Plan  1.New onset systolic heart failure, ejection fraction 20 to 25%, with gross volume overload - on bidil and toprol unable to use entresto or ACE/ARB due to renal failure Cath being deferred for time being due to renal failure   2.Atrial fibrillation with RVR, paroxysma  We will complete a full amiodarone load to help keep her in sinus rhythm (400 mg twice daily for 7 days and then 200 mg daily for 21 days and then stop) continue heparin consider transition to DOAC once renal function declares itself  3. AKI -Staghorn renal calculi noted as well as uterine fibroid which is large -Nephrology following for assistance with  diuretics -The staghorn renal calculi will require frequent monitoring for hydronephrosis  For questions or updates, please contact [place] Please consult [email or web address] for contact info under    [employer]

## 2019-10-28 ENCOUNTER — Inpatient Hospital Stay (HOSPITAL_COMMUNITY): Payer: Self-pay

## 2019-10-28 LAB — URINALYSIS, ROUTINE W REFLEX MICROSCOPIC
Bacteria, UA: NONE SEEN
Bilirubin Urine: NEGATIVE
Glucose, UA: NEGATIVE mg/dL
Ketones, ur: NEGATIVE mg/dL
Nitrite: NEGATIVE
Protein, ur: 100 mg/dL — AB
Specific Gravity, Urine: 1.006 (ref 1.005–1.030)
WBC, UA: >50 WBC/hpf — ABNORMAL HIGH (ref 0–5)
pH: 7 (ref 5.0–8.0)

## 2019-10-28 LAB — RENAL FUNCTION PANEL
Albumin: 3.7 g/dL (ref 3.5–5.0)
Anion gap: 12 (ref 5–15)
BUN: 59 mg/dL — ABNORMAL HIGH (ref 6–20)
CO2: 23 mmol/L (ref 22–32)
Calcium: 9.1 mg/dL (ref 8.9–10.3)
Chloride: 102 mmol/L (ref 98–111)
Creatinine, Ser: 3.37 mg/dL — ABNORMAL HIGH (ref 0.44–1.00)
GFR calc Af Amer: 17 mL/min — ABNORMAL LOW (ref >60)
GFR calc non Af Amer: 15 mL/min — ABNORMAL LOW (ref >60)
Glucose, Bld: 178 mg/dL — ABNORMAL HIGH (ref 70–99)
Phosphorus: 4.9 mg/dL — ABNORMAL HIGH (ref 2.5–4.6)
Potassium: 4.1 mmol/L (ref 3.5–5.1)
Sodium: 137 mmol/L (ref 135–145)

## 2019-10-28 LAB — GLUCOSE, CAPILLARY
Glucose-Capillary: 160 mg/dL — ABNORMAL HIGH (ref 70–99)
Glucose-Capillary: 234 mg/dL — ABNORMAL HIGH (ref 70–99)
Glucose-Capillary: 200 mg/dL — ABNORMAL HIGH (ref 70–99)
Glucose-Capillary: 186 mg/dL — ABNORMAL HIGH (ref 70–99)

## 2019-10-28 LAB — MAGNESIUM: Magnesium: 2 mg/dL (ref 1.7–2.4)

## 2019-10-28 LAB — HEPARIN LEVEL (UNFRACTIONATED): Heparin Unfractionated: 0.3 IU/mL (ref 0.30–0.70)

## 2019-10-28 MED ORDER — ALBUMIN HUMAN 25 % IV SOLN
25.0000 g | Freq: Two times a day (BID) | INTRAVENOUS | Status: AC
  Administered 2019-10-28 – 2019-10-31 (×6): 25 g via INTRAVENOUS
  Filled 2019-10-28 (×3): qty 50
  Filled 2019-10-28 (×3): qty 100
  Filled 2019-10-28 (×2): qty 50

## 2019-10-28 MED ORDER — FUROSEMIDE 10 MG/ML IJ SOLN
160.0000 mg | Freq: Four times a day (QID) | INTRAVENOUS | Status: DC
  Administered 2019-10-28 – 2019-10-31 (×13): 160 mg via INTRAVENOUS
  Filled 2019-10-28 (×4): qty 16
  Filled 2019-10-28: qty 10
  Filled 2019-10-28: qty 16
  Filled 2019-10-28 (×2): qty 10
  Filled 2019-10-28 (×3): qty 16
  Filled 2019-10-28: qty 10
  Filled 2019-10-28 (×5): qty 16

## 2019-10-28 MED ORDER — METOLAZONE 5 MG PO TABS
10.0000 mg | ORAL_TABLET | Freq: Two times a day (BID) | ORAL | Status: AC
  Administered 2019-10-28 – 2019-10-29 (×3): 10 mg via ORAL
  Filled 2019-10-28 (×4): qty 2

## 2019-10-28 MED ORDER — METOLAZONE 5 MG PO TABS
10.0000 mg | ORAL_TABLET | Freq: Once | ORAL | Status: AC
  Administered 2019-10-28: 15:00:00 10 mg via ORAL
  Filled 2019-10-28: qty 2

## 2019-10-28 MED ORDER — HEPARIN (PORCINE) 25000 UT/250ML-% IV SOLN
2000.0000 [IU]/h | INTRAVENOUS | Status: DC
  Administered 2019-10-28 – 2019-10-31 (×6): 1900 [IU]/h via INTRAVENOUS
  Administered 2019-11-01 (×2): 2000 [IU]/h via INTRAVENOUS
  Filled 2019-10-28 (×8): qty 250

## 2019-10-28 NOTE — Progress Notes (Signed)
[facility] Progress Note  Subjective: BP's soft 100's , 1.5L in and 1.2 L out, wt's up again 154kg. Creat up 3.37, BUN 59.  CXR done is clear.  Renal US no hydro but poor eval.   Vitals:   10/27/19 0701 10/27/19 1441 10/27/19 2144 10/28/19 0505  BP:  100/80 111/69 108/72  Pulse:  68 64 64  Resp:  16 19 16   Temp:  97.8 F (36.6 C) (!) 97.5 F (36.4 C) 97.8 F (36.6 C)  TempSrc:  Oral  Oral  SpO2:  98% 100% 96%  Weight: (!) 152.8 kg   (!) 154.8 kg  Height:        Inpatient medications: . amiodarone  400 mg Oral BID   Followed by  . [START ON 10/31/2019] amiodarone  200 mg Oral Daily  . insulin aspart  0-5 Units Subcutaneous QHS  . insulin aspart  0-9 Units Subcutaneous TID WC  . insulin glargine  12 Units Subcutaneous Daily  . metolazone  10 mg Oral BID  . metoprolol tartrate  50 mg Oral BID   . albumin human    . ferumoxytol 510 mg (10/26/19 1328)  . furosemide 160 mg (10/28/19 1113)  . heparin 1,900 Units/hr (10/28/19 1316)   acetaminophen **OR** acetaminophen, ondansetron (ZOFRAN) IV    Exam: Gen: morbidly obese AAF in NAD seen in bed in room CVS: no rub Resp: decreased BS Abd: obese, +BS, soft, NT Ext: 2+ doughy edema LE's bilat    CXR today - clear  Renal US - 11 cm kidneys, large bilateral renal staghorn calculi, better visualized on CT. No definite hydronephrosis, although poorly evaluated. Bladder decompressed by indwelling Foley catheter.   CT renal stone 11/3 > The kidneys demonstrate extensive bilateral staghorn calculi. No visible hydronephrosis. Difficult to follow the course of either ureter given lack of contrast and fluid in the abdomen. No contour deforming renal lesions are seen. There is circumferential bladder wall thickening.   ECHO 10/21/19 >  Left Ventricle: Left ventricular ejection fraction, by visual estimation, is 20 to 25%. The left ventricle has severely decreased function. The left ventricle demonstrates global hypokinesis.  There is mildly increased left ventricular hypertrophy. Left ventricular diastolic parameters are indeterminate. Normal left atrial pressure.   UA 11/3 > > 50 rbc/ wbc per hpf, prot 100   Urine prot-creat ratio = 1.0   UNa 87, UCr 39 on 11/7 (on lasix)   Assessment/Plan:  1. AKI/CKD- in setting of large, bilateral staghorn calculi, anasarca, atrial fibrillation with RVR, and new low EF 20--25%. Her lasix was held due to rising Scr and we were consulted to help assist with diuresis and lasix was restarted at higher dose w/ IV albumin. Serologic w/u showed ANCA's are both slightly up, ANA pend, anti-DNA neg, ASO neg, C3/C4 wnl,  SPEP and serum FLC are negative. Urine PC ratio = 1.0. Unfortunately w/ higher lasix doses creat is still rising and pt is voiding but not in amounts needing to affect the edema in her legs/ abdomen, and wt's cont to rise.  - will max out diuretics today and hopefully can turn this around. If not working will likely progress to needing dialysis. Have d/w the patient who understands and questions answered.  2. HTN - bp's soft, dc'd bidil , cont metoprolol at lower dose w/ hold order 3. New onset, acute systolic CHF with anasarca- cardiology following 4. Atrial fibrillation- now rate controlled with amiodarone. Currently on heparin drip 5. Bilateral staghorn calculi- evaluated  by Urology. No evidence of hydronephrosis so no indication for nephrostomy tubes 6. DM-poorly controlled with Hgb A1c 11.7%.per primary 7. Right hip pain s/p mechanical fall- no fx 8. Anemia- normocytic.Low iron will replete and follow 9. Uterine mass- is a large fibroid no change from CT in 2019  Kelly Splinter, MD 10/27/2019, 10:09 PM  Iron/TIBC/Ferritin/ %Sat    Component Value Date/Time   IRON 44 10/21/2019 0703   TIBC 355 10/21/2019 0703   FERRITIN 20 10/21/2019 0703   IRONPCTSAT 12 10/21/2019 0703   Recent Labs  Lab 10/28/19 0437  NA 137  K 4.1  CL 102  CO2 23  GLUCOSE 178*   BUN 59*  CREATININE 3.37*  CALCIUM 9.1  PHOS 4.9*  ALBUMIN 3.7   Recent Labs  Lab 10/22/19 0925  AST 23  ALT 22  ALKPHOS 69  BILITOT 1.2  PROT 6.5   Recent Labs  Lab 10/27/19 0411  WBC 5.2  HGB 9.3*  HCT 30.9*  PLT 183

## 2019-10-28 NOTE — Progress Notes (Signed)
PROGRESS NOTE  Adrienne Lane:[id number] DOB: July 11, 1966 DOA: 10/20/2019 PCP: Patient, No Pcp Per   LOS: 8 days   Brief Narrative / Interim history: Patient is a 53 year old African-American female, morbidly obese, with past medical history significant for diabetes mellitus type 2, hypertension, uterine fibroids, chronic leg edema/anasarca and query history of atrial fibrillation not on any medication.  Patient was admitted with right-sided hip pain following a fall.  Patient has not seen a provider for the last 1 to 2 years due to lack of insurance, and has not been compliant with his medications and insulin.  Patient developed progressive leg edema with increased abdominal girth over the last 4 months.  On presentation to the hospital, patient was found to be in atrial fibrillation with rapid ventricular response.  Echocardiogram done revealed EF of 20-25 %, global hypokinesis and RVSP of 58.3 mmHg.  Cardiology input is appreciated.  Significantly, despite severe anasarca, patient is not on oxygen.  Worsening renal function is reported.  UA reveals protein, few bacteria, greater than 50 RBC and greater than 50 WBC.  Renal ultrasound is negative for obstruction, better revealed bilateral staghorn calculi.  Albumin is low.  Nephrology team has also been consulted.  Patient will be started on IV albumin and Lasix.  For GN work-up as per nephrology.  Further management will depend on hospital course.  10/26/2019: Patient seen.  Input from nephrology and cardiology is appreciated.  Urine output noted, however, not sure about the accuracy.  Serum creatinine is 2.86 today.  We will add 3-day course of metolazone.  Continue to monitor renal function and electrolytes closely.  Further history from the patient indicated that she may obtain 40 to 60 pounds over the last 4 months.  10/27/2019: Patient seen.  Patient reports improved urine output.  We will continue current regimen.  UPC reveals approximately 1  g proteinuria.  2.87 today (stable).  Follow-up pending work-up.  10/28/2019: Patient seen.  No new complaints.  Tells me the urine output is improving, but creatinine is rising.  Serum creatinine today is 3.37.  Input from nephrology team is highly appreciated.  Continue to monitor I's and O's and renal function closely.  Subjective: No shortness of breath at rest. Reports some improvement in urine output.  Assessment & Plan: Principal Problem:   Atrial fibrillation with rapid ventricular response (HCC) Active Problems:   CHF (congestive heart failure) (HCC)   Elevated troponin   AKI (acute kidney injury) ([place])   Hyperglycemia   Atrial fibrillation with RVR (HCC)   Right hip pain   Type 2 diabetes mellitus without complication (HCC)   Essential hypertension   Uterine fibroid   A. fib with RVR: -Heart rate is controlled.   -Continue anticoagulation with heparin drip.   -Significantly low EF noted on echocardiogram could be secondary to tachycardia cardiomyopathy.  -Cardiology input is appreciated.  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 -Etiology of progressive edema/anasarca likely multifactorial. -Cardiology input is appreciated. -Baseline EF is not known.  Echocardiogram done on 10/21/2019 revealed EF of 20 to 25%.  Cannot entirely rule out tachycardia induced cardiomyopathy (patient was on rapid A. fib on presentation)  Acute kidney injury: -Patient's creatinine in 2019 was 0.9-1.0 range with labs done at [facility]. -Creatinine this admission 1.7, with diuresis gradually increasing and now it is 2.4.  Hold further Lasix. -Renal ultrasound negative for hydronephrosis  -Bilateral staghorn calculi noted. -Nephrology team has been consulted.  For possible GN work-up. 10/26/2019: Add 3-day course of metolazone.   Monitor renal function and electrolytes while on metolazone.  Continue to monitor urine output.  Serum creatinine today is 2.86.  LDL is 62. 10/27/2019: Serum creatinine seems to have peaked.  Continue current regimen.  Follow work-up.  Nephrology team is directing. 10/28/2019: According to the patient, urine output is improving.  The dose of metolazone has been increased to 10 mg p.o. twice daily by nephrology.  Nephrology team is also increased the dose of IV Lasix to 160 mg every 6 hours.  Serum creatinine today is 3.37.  This is worsening.  Will defer further management due to the nephrology team.   Bilateral staghorn renal calculi -Urology consulted, no need for interventions at this point.    Type 2 diabetes mellitus: -Poorly controlled, with hyperglycemia.  -Continue sliding scale and Lantus -Continue to optimize.  Right hip pain secondary to mechanical fall -Negative imaging for fractures  Normocytic anemia -Chronic disease  Uterine mass -CT scan of the abdomen and pelvis showed marked enlargement of the uterine fundus measuring up to 17 cm with heterogeneous attenuation and irregular curvilinear air lucency towards the superior aspect of this masslike and large amount.  This is concerning for endometrial carcinoma. -Patient apparently is aware of having uterine mass, she was told last year at [facility] that she is having uterine fibroids -Gynecology team consulted.  For further management on outpatient basis.  Scheduled Meds: . amiodarone  400 mg Oral BID   Followed by  . [START ON 10/31/2019] amiodarone  200 mg Oral Daily  . insulin aspart  0-5 Units Subcutaneous QHS  . insulin aspart  0-9 Units Subcutaneous TID WC  . insulin glargine  12 Units Subcutaneous Daily  . metolazone  10 mg Oral BID  . metoprolol tartrate  50 mg Oral BID   Continuous Infusions: . albumin human    . ferumoxytol 510 mg (10/26/19 1328)  . furosemide 160 mg (10/28/19 1113)  . heparin 1,900  Units/hr (10/28/19 1114)   PRN Meds:.acetaminophen **OR** acetaminophen, ondansetron (ZOFRAN) IV  DVT prophylaxis: heparin Code Status: Full code Family Communication:  Disposition Plan: This will depend on hospital course.  Consultants:  Cardiology  Urology  Nephrology  Procedures:   2D echo 10/21/2019  Plain films of the right hip and pelvis 10/20/2019  CXR 10/20/2019  Renal ultrasound 10/22/2019  Microbiology  None   Antimicrobials: None     Objective: Vitals:   10/27/19 0701 10/27/19 1441 10/27/19 2144 10/28/19 0505  BP:  100/80 111/69 108/72  Pulse:  68 64 64  Resp:  16 19 16   Temp:  97.8 F (36.6 C) (!) 97.5 F (36.4 C) 97.8 F (36.6 C)  TempSrc:  Oral  Oral  SpO2:  98% 100% 96%  Weight: (!) 152.8 kg   (!) 154.8 kg  Height:        Intake/Output Summary (Last 24 hours) at 10/28/2019 1144 Last data filed at 10/28/2019 0600 Gross per 24 hour  Intake 1563.26 ml  Output 1050 ml  Net 513.26 ml   Filed Weights   10/27/19 0443 10/27/19 0701 10/28/19 0505  Weight: (!) 153.8 kg (!) 152.8 kg (!) 154.8 kg    Examination:  Constitutional: Patient is morbidly obese.  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   Eyes: Pallor.  No jaundice.  ENMT: Mucous membranes are moist.  JVD is equivocal. Respiratory: clear to auscultation bilaterally Cardiovascular: S1-S2, irregular.   Abdomen: Mildly obese, pitting edema, organs are difficult to assess. Musculoskeletal: 2+ to 3 lower extremity edema/Anasarca.  Abdominal wall edema. Neurologic: Awake and alert.  Patient moves all extremities.  Data Reviewed: I have independently reviewed following labs and imaging studies   CBC: Recent Labs  Lab 10/23/19 0424 10/24/19 0410 10/25/19 0441 10/26/19 0434 10/27/19 0411  WBC 6.1 5.8 5.8 6.0 5.2  NEUTROABS  --   --   --  3.7  --   HGB 10.5* 9.7* 10.2* 9.9* 9.3*  HCT 34.4* 32.3* 34.3* 33.0* 30.9*  MCV 79.6* 79.6* 80.7 80.9 82.4  PLT 197 228 177 227 109   Basic Metabolic  Panel: Recent Labs  Lab 10/22/19 0925  10/23/19 0424  10/25/19 0441 10/26/19 0434 10/27/19 0411 10/27/19 0527 10/27/19 0545 10/28/19 0437  NA 138   < > 137   < > 137 138 138  --  139 137  K 3.4*   < > 4.6   < > 4.3 4.4 3.9  --  4.0 4.1  CL 104   < > 106   < > 103 103 105  --  104 102  CO2 23   < > 20*   < > 20* 22 21*  --  23 23  GLUCOSE 226*   < > 221*   < > 213* 213* 214*  --  197* 178*  BUN 37*   < > 42*   < > 49* 51* 57*  --  55* 59*  CREATININE 1.98*   < > 2.24*   < > 2.65* 2.86* 2.87*  --  2.89* 3.37*  CALCIUM 8.8*   < > 8.6*   < > 8.7* 8.9 8.8*  --  8.7* 9.1  MG 1.8  --  2.0  --   --  2.2  --  1.9  --  2.0  PHOS  --   --   --    < > 4.3 4.6 4.9*  --  5.0* 4.9*   < > = values in this interval not displayed.   GFR: Estimated Creatinine Clearance: 31.3 mL/min (A) (by C-G formula based on SCr of 3.37 mg/dL (H)). Liver Function Tests: Recent Labs  Lab 10/22/19 0925  10/25/19 0441 10/26/19 0434 10/27/19 0411 10/27/19 0545 10/28/19 0437  AST 23  --   --   --   --   --   --   ALT 22  --   --   --   --   --   --   ALKPHOS 69  --   --   --   --   --   --   BILITOT 1.2  --   --   --   --   --   --   PROT 6.5  --   --   --   --   --   --   ALBUMIN 2.8*   < > 2.7* 2.9* 3.0* 2.9* 3.7   < > = values in this interval not displayed.   No results for input(s): LIPASE, AMYLASE in the last 168 hours. No results for input(s): AMMONIA in the last 168 hours. Coagulation Profile: No results for input(s): INR, PROTIME in the last 168 hours. Cardiac Enzymes: No results for input(s): CKTOTAL, CKMB, CKMBINDEX, TROPONINI in the last 168 hours. BNP (last 3 results) No results for input(s): PROBNP in the last 8760 hours. HbA1C: No  results for input(s): HGBA1C in the last 72 hours. CBG: Recent Labs  Lab 10/27/19 0745 10/27/19 1431 10/27/19 1613 10/27/19 2146 10/28/19 0800  GLUCAP 182* 279* 226* 201* 160*   Lipid Profile: Recent Labs    10/26/19 1020  CHOL 122  HDL 49   LDLCALC 62  TRIG 57  CHOLHDL 2.5   Thyroid Function Tests: No results for input(s): TSH, T4TOTAL, FREET4, T3FREE, THYROIDAB in the last 72 hours. Anemia Panel: No results for input(s): VITAMINB12, FOLATE, FERRITIN, TIBC, IRON, RETICCTPCT in the last 72 hours. Urine analysis:    Component Value Date/Time   COLORURINE YELLOW 10/23/2019 1151   APPEARANCEUR CLOUDY (A) 10/23/2019 1151   LABSPEC 1.006 10/23/2019 1151   PHURINE 7.0 10/23/2019 1151   GLUCOSEU NEGATIVE 10/23/2019 1151   HGBUR MODERATE (A) 10/23/2019 1151   BILIRUBINUR NEGATIVE 10/23/2019 1151   KETONESUR NEGATIVE 10/23/2019 1151   PROTEINUR 100 (A) 10/23/2019 1151   NITRITE NEGATIVE 10/23/2019 1151   LEUKOCYTESUR LARGE (A) 10/23/2019 1151   Sepsis Labs: Invalid input(s): PROCALCITONIN, LACTICIDVEN  Recent Results (from the past 240 hour(s))  SARS CORONAVIRUS 2 (TAT 6-24 HRS) Nasopharyngeal Nasopharyngeal Swab     Status: None   Collection Time: 10/20/19 10:45 PM   Specimen: Nasopharyngeal Swab  Result Value Ref Range Status   SARS Coronavirus 2 NEGATIVE NEGATIVE Final    Comment: (NOTE) SARS-CoV-2 target nucleic acids are NOT DETECTED. The SARS-CoV-2 RNA is generally detectable in upper and lower respiratory specimens during the acute phase of infection. Negative results do not preclude SARS-CoV-2 infection, do not rule out co-infections with other pathogens, and should not be used as the sole basis for treatment or other patient management decisions. Negative results must be combined with clinical observations, patient history, and epidemiological information. The expected result is Negative. Fact Sheet for Patients: SugarRoll.be Fact Sheet for Healthcare Providers: https://www.woods-mathews.com/ This test is not yet approved or cleared by the [place] FDA and  has been authorized for detection and/or diagnosis of SARS-CoV-2 by FDA under an Emergency Use  Authorization (EUA). This EUA will remain  in effect (meaning this test can be used) for the duration of the COVID-19 declaration under Section 56 4(b)(1) of the Act, 21 U.S.C. section 360bbb-3(b)(1), unless the authorization is terminated or revoked sooner. Performed at [facility], [place] [address] [place], [place] [address]   Culture, Urine     Status: Abnormal   Collection Time: 10/23/19 11:51 AM   Specimen: Urine, Catheterized  Result Value Ref Range Status   Specimen Description   Final    URINE, CATHETERIZED Performed at [facility] [address], [place], [place] [address]    Special Requests   Final    NONE Performed at [facility], [place] [address] [place], [place] [address]    Culture MULTIPLE SPECIES PRESENT, SUGGEST RECOLLECTION (A)  Final   Report Status 10/24/2019 FINAL  Final  Culture, Urine     Status: None (Preliminary result)   Collection Time: 10/27/19 12:17 AM   Specimen: Urine, Random  Result Value Ref Range Status   Specimen Description   Final    URINE, RANDOM Performed at [facility] [address], [place], [place] [address]    Special Requests   Final    NONE Performed at [facility], [place] [address] [place], [place] [address]    Culture   Final    CULTURE REINCUBATED FOR BETTER GROWTH Performed at [facility], [place] [address] [place], [place] [address]    Report Status PENDING  Incomplete      Radiology Studies: US Renal  Result Date: 10/28/2019 CLINICAL DATA:  Acute kidney injury EXAM: RENAL / URINARY TRACT ULTRASOUND COMPLETE COMPARISON:  CT abdomen/pelvis dated 10/23/2019 FINDINGS: Right Kidney: Renal measurements: 11.4 x 5.9 x 5.2 cm = volume: 183 mL. Large staghorn calculus throughout the renal pelvis, better visualized on CT. No definite hydronephrosis, although poorly evaluated. Left Kidney: Renal measurements: 11.2 x 5.3 x 5.5 cm = volume:  171 mL. Large staghorn calculus throughout the renal pelvis, better visualized on CT. No definite hydronephrosis, although poorly evaluated. Bladder: Decompressed by indwelling Foley catheter. Other: None. IMPRESSION: Large bilateral renal staghorn calculi, better visualized on CT. No definite hydronephrosis, although poorly evaluated. Bladder decompressed by indwelling Foley catheter. Electronically Signed   By: Julian Hy M.D.   On: 10/28/2019 10:22   Dg Chest Port 1 View  Result Date: 10/28/2019 CLINICAL DATA:  Shortness of breath, anasarca EXAM: PORTABLE CHEST 1 VIEW COMPARISON:  None. FINDINGS: Low lung volumes. No focal consolidation. No pleural effusion or pneumothorax. Cardiomegaly. IMPRESSION: No evidence of acute cardiopulmonary disease. Electronically Signed   By: Julian Hy M.D.   On: 10/28/2019 10:23   Dana Allan MD. [facility]  Contact via  [email or web address]  [place] P: [phone] F: [phone]

## 2019-10-28 NOTE — Progress Notes (Signed)
Cardiology Progress Note  Patient ID: Adrienne Lane MRN: [id number] DOB: [birth year] Date of Encounter: 10/28/2019  Primary Cardiologist: Donato Heinz, MD  Subjective   No cardiac complaints   ROS:  All other ROS reviewed and negative. Pertinent positives noted in the HPI.     Inpatient Medications  Scheduled Meds: . amiodarone  400 mg Oral BID   Followed by  . [START ON 10/31/2019] amiodarone  200 mg Oral Daily  . insulin aspart  0-5 Units Subcutaneous QHS  . insulin aspart  0-9 Units Subcutaneous TID WC  . insulin glargine  12 Units Subcutaneous Daily  . metolazone  5 mg Oral BID  . metoprolol tartrate  50 mg Oral BID   Continuous Infusions: . albumin human 60 mL/hr at 10/28/19 0600  . ferumoxytol 510 mg (10/26/19 1328)  . furosemide 160 mg (10/28/19 0658)  . heparin 1,850 Units/hr (10/28/19 0600)   PRN Meds: acetaminophen **OR** acetaminophen, ondansetron (ZOFRAN) IV   Vital Signs   Vitals:   10/27/19 0701 10/27/19 1441 10/27/19 2144 10/28/19 0505  BP:  100/80 111/69 108/72  Pulse:  68 64 64  Resp:  16 19 16   Temp:  97.8 F (36.6 C) (!) 97.5 F (36.4 C) 97.8 F (36.6 C)  TempSrc:  Oral  Oral  SpO2:  98% 100% 96%  Weight: (!) 152.8 kg   (!) 154.8 kg  Height:        Intake/Output Summary (Last 24 hours) at 10/28/2019 0808 Last data filed at 10/28/2019 0600 Gross per 24 hour  Intake 1563.26 ml  Output 1250 ml  Net 313.26 ml   Last 3 Weights 10/28/2019 10/27/2019 10/27/2019  Weight (lbs) 341 lb 3.2 oz 336 lb 12.8 oz 339 lb 1.1 oz  Weight (kg) 154.767 kg 152.771 kg 153.8 kg      Telemetry   NSR occasional PVC;s   ECG  The most recent ECG shows sinus rhythm with paroxysmal A. fib and PVCs, which I personally reviewed.   Physical Exam   Vitals:   10/27/19 0701 10/27/19 1441 10/27/19 2144 10/28/19 0505  BP:  100/80 111/69 108/72  Pulse:  68 64 64  Resp:  16 19 16   Temp:  97.8 F (36.6 C) (!) 97.5 F (36.4 C) 97.8 F (36.6 C)  TempSrc:   Oral  Oral  SpO2:  98% 100% 96%  Weight: (!) 152.8 kg   (!) 154.8 kg  Height:         Intake/Output Summary (Last 24 hours) at 10/28/2019 0808 Last data filed at 10/28/2019 0600 Gross per 24 hour  Intake 1563.26 ml  Output 1250 ml  Net 313.26 ml    Last 3 Weights 10/28/2019 10/27/2019 10/27/2019  Weight (lbs) 341 lb 3.2 oz 336 lb 12.8 oz 339 lb 1.1 oz  Weight (kg) 154.767 kg 152.771 kg 153.8 kg    Body mass index is 51.88 kg/m.   Affect appropriate Obese black female  HEENT: normal Neck supple with no adenopathy JVP normal no bruits no thyromegaly Lungs clear with no wheezing and good diaphragmatic motion Heart:  S1/S2 no murmur, no rub, gallop or click PMI normal Abdomen: benighn, BS positve, no tenderness, no AAA no bruit.  No HSM or HJR Distal pulses intact with no bruits No edema Neuro non-focal Skin warm and dry No muscular weakness   Labs  High Sensitivity Troponin:   Recent Labs  Lab 10/20/19 2028 10/20/19 2245  TROPONINIHS 103* 83*     Cardiac EnzymesNo results for  input(s): TROPONINI in the last 168 hours. No results for input(s): TROPIPOC in the last 168 hours.  Chemistry Recent Labs  Lab 10/22/19 0925  10/27/19 0411 10/27/19 0545 10/28/19 0437  NA 138   < > 138 139 137  K 3.4*   < > 3.9 4.0 4.1  CL 104   < > 105 104 102  CO2 23   < > 21* 23 23  GLUCOSE 226*   < > 214* 197* 178*  BUN 37*   < > 57* 55* 59*  CREATININE 1.98*   < > 2.87* 2.89* 3.37*  CALCIUM 8.8*   < > 8.8* 8.7* 9.1  PROT 6.5  --   --   --   --   ALBUMIN 2.8*   < > 3.0* 2.9* 3.7  AST 23  --   --   --   --   ALT 22  --   --   --   --   ALKPHOS 69  --   --   --   --   BILITOT 1.2  --   --   --   --   GFRNONAA 29*   < > 18* 18* 15*  GFRAA 33*   < > 21* 21* 17*  ANIONGAP 11   < > 12 12 12    < > = values in this interval not displayed.    Hematology Recent Labs  Lab 10/25/19 0441 10/26/19 0434 10/27/19 0411  WBC 5.8 6.0 5.2  RBC 4.25 4.08 3.75*  HGB 10.2* 9.9* 9.3*  HCT  34.3* 33.0* 30.9*  MCV 80.7 80.9 82.4  MCH 24.0* 24.3* 24.8*  MCHC 29.7* 30.0 30.1  RDW 19.0* 18.9* 19.2*  PLT 177 227 183   BNP No results for input(s): BNP, PROBNP in the last 168 hours.  DDimer No results for input(s): DDIMER in the last 168 hours.   Radiology  No results found.  Cardiac Studies  TTE 10/21/2019 1. Left ventricular ejection fraction, by visual estimation, is 20 to 25%. The left ventricle has severely decreased function. There is mildly increased left ventricular hypertrophy.  2. Left ventricular diastolic parameters are indeterminate.  3. The left ventricle demonstrates global hypokinesis.  4. Global right ventricle has normal systolic function.The right ventricular size is mildly enlarged. No increase in right ventricular wall thickness.  5. Left atrial size was moderately dilated.  6. Right atrial size was moderately dilated.  7. The pericardial effusion is circumferential.  8. Trivial pericardial effusion is present.  9. The mitral valve is normal in structure. Mild to moderate mitral valve regurgitation. No evidence of mitral stenosis. 10. The tricuspid valve is normal in structure. Tricuspid valve regurgitation moderate-severe. 11. The aortic valve is normal in structure. Aortic valve regurgitation is not visualized. No evidence of aortic valve sclerosis or stenosis. 12. The pulmonic valve was normal in structure. Pulmonic valve regurgitation is not visualized. 13. Moderately elevated pulmonary artery systolic pressure. 14. The tricuspid regurgitant velocity is 3.29 m/s, and with an assumed right atrial pressure of 15 mmHg, the estimated right ventricular systolic pressure is moderately elevated at 58.3 mmHg. 15. The inferior vena cava is dilated in size with <50% respiratory variability, suggesting right atrial pressure of 15 mmHg.  CT A/P  1. Imaging quality is significantly degraded due to patient body habitus with resulting central photon starvation.  Evaluation is furthermore complicated by lack of intravenous contrast material. 2. Marked enlargement of the uterine fundus measuring up to 17 cm in  size, with heterogeneous attenuation and a an irregular curvilinear air lucency towards the superior aspect of this masslike enlargement. 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 Recommend further evaluation with pelvic ultrasound. 3. Extensive bilateral staghorn calculi. No visible hydronephrosis. Difficult to follow the course of either ureter given lack of contrast and fluid extensive fluid in the abdomen. 4. Severe anasarca with extensive body wall edema, mesenteric congestion, ascites, and right pleural effusion. 5. Circumferential thickening of the urinary bladder. Correlate with urinalysis to exclude cystitis. 6. Marked cardiomegaly with four-chamber enlargement. 7. Regions of mosaic attenuation in the lower lungs may reflect areas of air trapping versus imaging during exhalation. 8. Aortic Atherosclerosis (ICD10-I70.0).   Patient Profile  Adrienne Lane a 53 y.o.femalewith history of diabetes(A1c 11.1),CKD, obesity who was admitted on 11/1 for new onset systolic heart failure as well as atrial fibrillation with RVR. This is a new diagnosis for her.  Assessment & Plan  1.New onset systolic heart failure, ejection fraction 20 to 25%, with gross volume overload - on bidil and toprol unable to use entresto or ACE/ARB due to renal failure Cath being deferred for time being due to renal failure   2.Atrial fibrillation with RVR, paroxysma  We will complete a full amiodarone load to help keep her in sinus rhythm (400 mg twice daily for 7 days and then 200 mg daily for 21 days and then stop) continue heparin consider transition to DOAC once renal function declares itself  3. AKI -Staghorn renal calculi noted as well as uterine fibroid which is large -Nephrology following for assistance with diuretics -The staghorn  renal calculi will require frequent monitoring for hydronephrosis - Cr up to 3.37 this am   For questions or updates, please contact [place] Please consult [email or web address] for contact info under    [employer]

## 2019-10-28 NOTE — Progress Notes (Signed)
ANTICOAGULATION CONSULT NOTE  Pharmacy Consult for IV heparin Indication: atrial fibrillation  No Known Allergies  Patient Measurements: Height: 5\' 8"  (172.7 cm) Weight: (!) 341 lb 3.2 oz (154.8 kg) IBW/kg (Calculated) : 63.9 Heparin Dosing Weight: 100.6 kg  Vital Signs: Temp: 97.8 F (36.6 C) (11/08 0505) Temp Source: Oral (11/08 0505) BP: 108/72 (11/08 0505) Pulse Rate: 64 (11/08 0505)  Labs: Recent Labs    10/26/19 0434 10/27/19 0411 10/27/19 0545 10/28/19 0437  HGB 9.9* 9.3*  --   --   HCT 33.0* 30.9*  --   --   PLT 227 183  --   --   HEPARINUNFRC 0.37 0.41  --  0.30  CREATININE 2.86* 2.87* 2.89* 3.37*    Estimated Creatinine Clearance: 31.3 mL/min (A) (by C-G formula based on SCr of 3.37 mg/dL (H)).   Assessment: 34 yoF with PMH DM2, HTN, presents with R hip pain after falling. Also reports several months of LE edema, orthopnea, and abdominal distention. Found to have elevated BNP and to also be in AFib with RVR. Pharmacy to dose heparin   Baseline INR, aPTT: elevated; charted as being drawn before heparin started, but RN notes report drawing aPTT/INR while heparin running  Prior anticoagulation: none  10/28/2019  heparin level 0.30 is therapeutic at the low end of goal on heparin  at 1850 units/hr No CBC today, but has been stable, no bleeding reported,   Goal of Therapy: Heparin level 0.3-0.7 units/ml Monitor platelets by anticoagulation protocol: Yes  Plan:  increase heparin drip to 1900 units/hr  Daily CBC, daily heparin level   Monitor for signs of bleeding or thrombosis  Eudelia Bunch, Pharm.D [phone] 10/28/2019 9:21 AM

## 2019-10-29 ENCOUNTER — Inpatient Hospital Stay (HOSPITAL_COMMUNITY): Payer: Self-pay

## 2019-10-29 DIAGNOSIS — I5021 Acute systolic (congestive) heart failure: Secondary | ICD-10-CM

## 2019-10-29 LAB — RENAL FUNCTION PANEL
Albumin: 4 g/dL (ref 3.5–5.0)
Anion gap: 11 (ref 5–15)
BUN: 60 mg/dL — ABNORMAL HIGH (ref 6–20)
CO2: 22 mmol/L (ref 22–32)
Calcium: 9.5 mg/dL (ref 8.9–10.3)
Chloride: 105 mmol/L (ref 98–111)
Creatinine, Ser: 3.42 mg/dL — ABNORMAL HIGH (ref 0.44–1.00)
GFR calc Af Amer: 17 mL/min — ABNORMAL LOW (ref >60)
GFR calc non Af Amer: 15 mL/min — ABNORMAL LOW (ref >60)
Glucose, Bld: 167 mg/dL — ABNORMAL HIGH (ref 70–99)
Phosphorus: 5 mg/dL — ABNORMAL HIGH (ref 2.5–4.6)
Potassium: 4.1 mmol/L (ref 3.5–5.1)
Sodium: 138 mmol/L (ref 135–145)

## 2019-10-29 LAB — CBC
HCT: 32.4 % — ABNORMAL LOW (ref 36.0–46.0)
Hemoglobin: 9.6 g/dL — ABNORMAL LOW (ref 12.0–15.0)
MCH: 24.2 pg — ABNORMAL LOW (ref 26.0–34.0)
MCHC: 29.6 g/dL — ABNORMAL LOW (ref 30.0–36.0)
MCV: 81.8 fL (ref 80.0–100.0)
Platelets: 234 10*3/uL (ref 150–400)
RBC: 3.96 MIL/uL (ref 3.87–5.11)
RDW: 19.3 % — ABNORMAL HIGH (ref 11.5–15.5)
WBC: 5.5 10*3/uL (ref 4.0–10.5)
nRBC: 0.5 % — ABNORMAL HIGH (ref 0.0–0.2)

## 2019-10-29 LAB — MAGNESIUM: Magnesium: 2 mg/dL (ref 1.7–2.4)

## 2019-10-29 LAB — COOXEMETRY PANEL
Carboxyhemoglobin: 1.8 % — ABNORMAL HIGH (ref 0.5–1.5)
Methemoglobin: 0.7 % (ref 0.0–1.5)
O2 Saturation: 92.9 %
Total hemoglobin: 9.7 g/dL — ABNORMAL LOW (ref 12.0–16.0)

## 2019-10-29 LAB — URINE CULTURE: Culture: 20000 — AB

## 2019-10-29 LAB — HEPARIN LEVEL (UNFRACTIONATED): Heparin Unfractionated: 0.42 IU/mL (ref 0.30–0.70)

## 2019-10-29 LAB — GLUCOSE, CAPILLARY
Glucose-Capillary: 144 mg/dL — ABNORMAL HIGH (ref 70–99)
Glucose-Capillary: 193 mg/dL — ABNORMAL HIGH (ref 70–99)
Glucose-Capillary: 205 mg/dL — ABNORMAL HIGH (ref 70–99)

## 2019-10-29 LAB — GLOMERULAR BASEMENT MEMBRANE ANTIBODIES: GBM Ab: 3 units (ref 0–20)

## 2019-10-29 LAB — IMMUNOFIXATION, URINE

## 2019-10-29 MED ORDER — ISOSORBIDE MONONITRATE ER 30 MG PO TB24
15.0000 mg | ORAL_TABLET | Freq: Every day | ORAL | Status: DC
  Administered 2019-10-29 – 2019-10-31 (×3): 15 mg via ORAL
  Filled 2019-10-29 (×3): qty 1

## 2019-10-29 MED ORDER — HYDRALAZINE HCL 10 MG PO TABS
10.0000 mg | ORAL_TABLET | Freq: Four times a day (QID) | ORAL | Status: DC
  Administered 2019-10-29 – 2019-11-01 (×12): 10 mg via ORAL
  Filled 2019-10-29 (×12): qty 1

## 2019-10-29 MED ORDER — SODIUM CHLORIDE 0.9 % IV SOLN: 250.0000 [IU]/h | INTRAVENOUS | Status: DC

## 2019-10-29 MED ORDER — PRISMASOL BGK 4/2.5 32-4-2.5 MEQ/L REPLACEMENT SOLN
Status: DC
  Administered 2019-10-29 – 2019-11-01 (×4): via INTRAVENOUS_CENTRAL

## 2019-10-29 MED ORDER — CHLORHEXIDINE GLUCONATE CLOTH 2 % EX PADS
6.0000 | MEDICATED_PAD | Freq: Every day | CUTANEOUS | Status: DC
  Administered 2019-10-29 – 2019-11-11 (×11): 6 via TOPICAL

## 2019-10-29 MED ORDER — FENTANYL CITRATE (PF) 100 MCG/2ML IJ SOLN
25.0000 ug | Freq: Once | INTRAMUSCULAR | Status: AC
  Administered 2019-10-29: 25 ug via INTRAVENOUS
  Filled 2019-10-29: qty 2

## 2019-10-29 MED ORDER — HEPARIN SODIUM (PORCINE) 1000 UNIT/ML DIALYSIS
1000.0000 [IU] | INTRAMUSCULAR | Status: DC | PRN
  Administered 2019-10-29: 4000 [IU] via INTRAVENOUS_CENTRAL
  Filled 2019-10-29 (×3): qty 6

## 2019-10-29 MED ORDER — HYDROCODONE-ACETAMINOPHEN 5-325 MG PO TABS
1.0000 | ORAL_TABLET | ORAL | Status: DC | PRN
  Administered 2019-10-29 – 2019-11-06 (×8): 1 via ORAL
  Filled 2019-10-29 (×8): qty 1

## 2019-10-29 MED ORDER — MIDAZOLAM HCL 2 MG/2ML IJ SOLN
2.0000 mg | Freq: Once | INTRAMUSCULAR | Status: AC
  Administered 2019-10-29: 18:00:00 2 mg via INTRAVENOUS

## 2019-10-29 MED ORDER — ALTEPLASE 2 MG IJ SOLR: 2.0000 mg | Freq: Once | INTRAMUSCULAR | Status: DC | PRN

## 2019-10-29 MED ORDER — HEPARIN BOLUS VIA INFUSION (CRRT): 1000.0000 [IU] | INTRAVENOUS | Status: DC | PRN

## 2019-10-29 MED ORDER — PRISMASOL BGK 4/2.5 32-4-2.5 MEQ/L IV SOLN
INTRAVENOUS | Status: DC
  Administered 2019-10-29 – 2019-11-01 (×20): via INTRAVENOUS_CENTRAL

## 2019-10-29 MED ORDER — SODIUM CHLORIDE 0.9 % FOR CRRT
INTRAVENOUS_CENTRAL | Status: DC | PRN
  Filled 2019-10-29: qty 1000

## 2019-10-29 MED ORDER — PRISMASOL BGK 4/2.5 32-4-2.5 MEQ/L REPLACEMENT SOLN
Status: DC
  Administered 2019-10-29: 21:00:00 via INTRAVENOUS_CENTRAL
  Administered 2019-10-30: 400 mL via INTRAVENOUS_CENTRAL
  Administered 2019-10-30 – 2019-11-01 (×4): via INTRAVENOUS_CENTRAL

## 2019-10-29 NOTE — Progress Notes (Addendum)
[facility] Progress Note  Subjective: BP's up a bit. 1.4L out yest and 1.9 in . 1.3 L uop so far today. Wt's stable and creat up slightly.  No new issues per pt.   Vitals:   10/29/19 0459 10/29/19 1147 10/29/19 1208 10/29/19 1319  BP: 114/81 120/69 (!) 128/100 (!) 160/85  Pulse: 64 71 70 80  Resp: 17 16  16   Temp: (!) 97.5 F (36.4 C) 97.9 F (36.6 C)    TempSrc: Oral Oral    SpO2: 99% 100% 100% 100%  Weight: (!) 154.6 kg     Height:        Inpatient medications: . amiodarone  400 mg Oral BID   Followed by  . [START ON 10/31/2019] amiodarone  200 mg Oral Daily  . hydrALAZINE  10 mg Oral Q6H  . insulin aspart  0-5 Units Subcutaneous QHS  . insulin aspart  0-9 Units Subcutaneous TID WC  . insulin glargine  12 Units Subcutaneous Daily  . isosorbide mononitrate  15 mg Oral Daily  . metolazone  10 mg Oral BID   . albumin human 25 g (10/29/19 1129)  . ferumoxytol 510 mg (10/26/19 1328)  . furosemide 160 mg (10/29/19 1323)  . heparin 1,900 Units/hr (10/29/19 0600)   acetaminophen **OR** acetaminophen, ondansetron (ZOFRAN) IV    Exam: Gen: morbidly obese AAF in NAD seen in bed in room CVS: no rub Resp: decreased BS Abd: obese, +BS, soft, NT Ext: 2+ doughy edema LE's bilat    CXR today - clear  Renal US - 11 cm kidneys, large bilateral renal staghorn calculi, better visualized on CT. No definite hydronephrosis, although poorly evaluated. Bladder decompressed by indwelling Foley catheter.   CT renal stone 11/3 > The kidneys demonstrate extensive bilateral staghorn calculi. No visible hydronephrosis. Difficult to follow the course of either ureter given lack of contrast and fluid in the abdomen. No contour deforming renal lesions are seen. There is circumferential bladder wall thickening.   ECHO 10/21/19 >  Left Ventricle: Left ventricular ejection fraction, by visual estimation, is 20 to 25%. The left ventricle has severely decreased function. The left ventricle  demonstrates global hypokinesis. There is mildly increased left ventricular hypertrophy. Left ventricular diastolic parameters are indeterminate. Normal left atrial pressure.   UA 11/3 > > 50 rbc/ wbc per hpf, prot 100   Urine prot-creat ratio = 1.0   UNa 87, UCr 39 on 11/7 (on lasix)   Assessment/Plan:  1. AKI/CKD- in setting of large, bilateral staghorn calculi, anasarca, atrial fibrillation with RVR, and new low EF 20--25%. Her lasix was held due to rising Scr and we were consulted to help assist with diuresis and lasix was restarted at higher dose w/ IV albumin. Serologic w/u showed ANCA's are both slightly up, anti-DNA neg, ASO neg, C3/C4 wnl,  SPEP and serum FLC negative. Urine PC ratio = 1.0.  - unfortunately patient requiring max doses of diuretics to keep vol even, but has sig anasarca left , and +8kg up from admission - creat stable overnight, no uremic signs, no indication for RRT yet - cont max diuretics - have d/w Dr [name] of CHF team 2. HTN - bp's up, back on hydral/ imdur 3. New onset, acute systolic CHF with anasarca- cardiology following 4. Atrial fibrillation- now rate controlled with amiodarone. Currently on heparin drip 5. Bilateral staghorn calculi- evaluated by Urology. No evidence of hydronephrosis so no indication for nephrostomy tubes 6. DM-poorly controlled with Hgb A1c 11.7%.per primary 7.  Right hip pain s/p mechanical fall- no fx 8. Anemia- normocytic.Low iron will replete and follow 9. Uterine mass- is a large fibroid no change from CT in 2019  Adrienne Splinter, MD 10/27/2019, 10:09 PM  Iron/TIBC/Ferritin/ %Sat    Component Value Date/Time   IRON 44 10/21/2019 0703   TIBC 355 10/21/2019 0703   FERRITIN 20 10/21/2019 0703   IRONPCTSAT 12 10/21/2019 0703   Recent Labs  Lab 10/29/19 0403  NA 138  K 4.1  CL 105  CO2 22  GLUCOSE 167*  BUN 60*  CREATININE 3.42*  CALCIUM 9.5  PHOS 5.0*  ALBUMIN 4.0   No results for input(s): AST, ALT,  ALKPHOS, BILITOT, PROT in the last 168 hours. Recent Labs  Lab 10/29/19 0403  WBC 5.5  HGB 9.6*  HCT 32.4*  PLT 234

## 2019-10-29 NOTE — CV Procedure (Signed)
Central Venous Catheter Insertion Procedure Note Adrienne Lane [id number] 05-19-1966    Procedure: Insertion of Central Venous Catheter Indications: Hemodialysis   Procedure Details Consent: Risks of procedure as well as the alternatives and risks of each were explained to the (patient/caregiver).  Consent for procedure obtained. Time Out: Verified patient identification, verified procedure, site/side was marked, verified correct patient position, special equipment/implants available, medications/allergies/relevent history reviewed, required imaging and test results available.  Performed   Maximum sterile technique was used including antiseptics, cap, gloves, gown, hand hygiene, mask and sheet. Skin prep: Chlorhexidine; local anesthetic administered A antimicrobial bonded/coated 13Fr 15cm triple lumen Trialysis catheter was placed in the right internal jugular vein using the Seldinger technique and u/s guidance.   Evaluation Blood flow good Complications: No apparent complications Patient did tolerate procedure well. Chest X-ray ordered to verify placement.  CXR: pending   [name], MD  6:02 PM

## 2019-10-29 NOTE — Consult Note (Signed)
Advanced Heart Failure Team Consult Note   Primary Physician: Patient, No Pcp Per PCP-Cardiologist:  Adrienne Heinz, MD  Reason for Consultation: Heart Failure   HPI:    Adrienne Lane is seen today for evaluation of heart failure at the request of Dr Adrienne Lane.   Adrienne Lane is a 53 year old with a history of morbid obesity, HTN and DM. She had been off all medications for at least a year.   Presented to [facility] after a mechanical fall and landed on her hip. In the ED EKG showed A fib RVR. Blood pressure was high so metoprolol was added for rate control. HS Trop 103>83. BNP 1623. Xray hip was negative. Cardiology consulted for new A fib and acute systolic heart failure.  ECHO showed EF 20-25% with severe RV dysfunction as well. 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 Started onamio/ heparin drip and IV lasix. Renal function worsened. Abdominal Xray showed large staghorn calculi. CT Renal Stone showed bilateral staghorn calculi without hydronephrosis and severe anasarca. Urology consulted on 11/3 with mention of possible nephrostomy tubes but renal US was negative for hydronephrosis. GYN consulted for uterine mass. Mass was thought to be large fibroid that will need to follow up after discharge. Nephrology consulted for worsening renal failure. Diuresing with high dose 160 mg IV lasix every 6 hours + metolazone 10 mg twice a day with sluggish response.    Creatinine worsening with attempts at diuresis 1.76  -> ->  3.42  Endorses SOB with any movement. No CP. Says baseline weight close to 250. Now weighs 340.   Transferring to [facility] for HF management.   Echo this admit- EF 20-25%. Dilated LA/RA. Trivial Pericardial Effusion.   Review of Systems: [y] = yes, [ ]  = no   . General: Weight gain [Y ]; Weight loss [ ] ; Anorexia [ ] ; Fatigue [Y ]; Fever [ ] ; Chills [ ] ; Weakness [Y ]  . Cardiac: Chest pain/pressure [ ] ; Resting SOB Blue.Reese ]; Exertional SOB [Y ]; Orthopnea [ ] ; Pedal Edema Blue.Reese ];  Palpitations [ ] ; Syncope [ ] ; Presyncope [ ] ; Paroxysmal nocturnal dyspnea[y ]  . Pulmonary: Cough [ ] ; Wheezing[ ] ; Hemoptysis[ ] ; Sputum [ ] ; Snoring [ ]   . GI: Vomiting[ ] ; Dysphagia[ ] ; Melena[ ] ; Hematochezia [ ] ; Heartburn[ ] ; Abdominal pain [ ] ; Constipation [ ] ; Diarrhea [ ] ; BRBPR [ ]   . GU: Hematuria[ ] ; Dysuria [ ] ; Nocturia[ ]   . Vascular: Pain in legs with walking [ ] ; Pain in feet with lying flat [ ] ; Non-healing sores [ ] ; Stroke [ ] ; TIA [ ] ; Slurred speech [ ] ;  . Neuro: Headaches[ ] ; Vertigo[ ] ; Seizures[ ] ; Paresthesias[ ] ;Blurred vision [ ] ; Diplopia [ ] ; Vision changes [ ]   . Ortho/Skin: Arthritis Blue.Reese ]; Joint pain [ Y]; Muscle pain [ ] ; Joint swelling [ ] ; Back Pain [ ] ; Rash [ ]  . Psych: Depression[ ] ; Anxiety[ ]   . Heme: Bleeding problems [ ] ; Clotting disorders [ ] ; Anemia [ ]   . Endocrine: Diabetes [Y ]; Thyroid dysfunction[ ]   Home Medications Prior to Admission medications   Medication Sig Start Date End Date Taking? Authorizing Provider  Insulin Glargine (BASAGLAR KWIKPEN) 100 UNIT/ML SOPN Inject 30 Units into the skin every morning.   Yes [provider]  lisinopril (ZESTRIL) 10 MG tablet Take 10 mg by mouth daily.   Yes [provider]  metoprolol succinate (TOPROL-XL) 50 MG 24 hr tablet Take 1 tablet by mouth daily.  Yes [provider]    Past Medical History: Past Medical History:  Diagnosis Date  . Diabetes mellitus without complication ([place])   . Hypertension     Past Surgical History: Past Surgical History:  Procedure Laterality Date  . CHOLECYSTECTOMY      Family History: Family History  Problem Relation Age of Onset  . Atrial fibrillation Mother     Social History: Social History   Socioeconomic History  . Marital status: Married    Spouse name: Not on file  . Number of children: Not on file  . Years of education: Not on file  . Highest education level: Not on file  Occupational History  . Not on file   Social Needs  . Financial resource strain: Not on file  . Food insecurity    Worry: Not on file    Inability: Not on file  . Transportation needs    Medical: Not on file    Non-medical: Not on file  Tobacco Use  . Smoking status: Never Smoker  . Smokeless tobacco: Never Used  Substance and Sexual Activity  . Alcohol use: Never    Frequency: Never  . Drug use: Never  . Sexual activity: Not on file  Lifestyle  . Physical activity    Days per week: Not on file    Minutes per session: Not on file  . Stress: Not on file  Relationships  . Social Herbalist on phone: Not on file    Gets together: Not on file    Attends religious service: Not on file    Active member of club or organization: Not on file    Attends meetings of clubs or organizations: Not on file    Relationship status: Not on file  Other Topics Concern  . Not on file  Social History Narrative  . Not on file    Allergies:  No Known Allergies  Objective:    Vital Signs:   Temp:  [97.5 F (36.4 C)-98.3 F (36.8 C)] 97.5 F (36.4 C) (11/09 0459) Pulse Rate:  [64-77] 64 (11/09 0459) Resp:  [17] 17 (11/09 0459) BP: (114)/(66-81) 114/81 (11/09 0459) SpO2:  [98 %-99 %] 99 % (11/09 0459) Weight:  [154.6 kg] 154.6 kg (11/09 0459) Last BM Date: 10/28/19  Weight change: Filed Weights   10/27/19 0701 10/28/19 0505 10/29/19 0459  Weight: (!) 152.8 kg (!) 154.8 kg (!) 154.6 kg    Intake/Output:   Intake/Output Summary (Last 24 hours) at 10/29/2019 1028 Last data filed at 10/29/2019 0907 Gross per 24 hour  Intake 1424.2 ml  Output 1725 ml  Net -300.8 ml      Physical Exam    General: Morbidly obese woman Lying in bed.  HEENT: normal Neck: supple. JVP to ear . Carotids 2+ bilat; no bruits. No lymphadenopathy or thyromegaly appreciated. Cor: PMI nondisplaced. Regular rate & rhythm. No rubs, gallops or murmurs. Lungs: clear Abdomen: obese soft, nontender, + distended. No hepatosplenomegaly. No  bruits or masses. Good bowel sounds. Extremities: no cyanosis, clubbing, rash, 3+ edema Neuro: alert & orientedx3, cranial nerves grossly intact. moves all 4 extremities w/o difficulty. Affect pleasant   Telemetry   NSR with frequent PACs/PVCs. Personally reviewed   EKG    Probable A Fib RVR on admit (versus sinus tach/MAT with frequent PACs)  Personally reviewed   Labs   Basic Metabolic Panel: Recent Labs  Lab 10/23/19 0424  10/26/19 0434 10/27/19 0411 10/27/19 0737 10/27/19 0545 10/28/19 1062  10/29/19 0403  NA 137   < > 138 138  --  139 137 138  K 4.6   < > 4.4 3.9  --  4.0 4.1 4.1  CL 106   < > 103 105  --  104 102 105  CO2 20*   < > 22 21*  --  23 23 22   GLUCOSE 221*   < > 213* 214*  --  197* 178* 167*  BUN 42*   < > 51* 57*  --  55* 59* 60*  CREATININE 2.24*   < > 2.86* 2.87*  --  2.89* 3.37* 3.42*  CALCIUM 8.6*   < > 8.9 8.8*  --  8.7* 9.1 9.5  MG 2.0  --  2.2  --  1.9  --  2.0 2.0  PHOS  --    < > 4.6 4.9*  --  5.0* 4.9* 5.0*   < > = values in this interval not displayed.    Liver Function Tests: Recent Labs  Lab 10/26/19 0434 10/27/19 0411 10/27/19 0545 10/28/19 0437 10/29/19 0403  ALBUMIN 2.9* 3.0* 2.9* 3.7 4.0   No results for input(s): LIPASE, AMYLASE in the last 168 hours. No results for input(s): AMMONIA in the last 168 hours.  CBC: Recent Labs  Lab 10/24/19 0410 10/25/19 0441 10/26/19 0434 10/27/19 0411 10/29/19 0403  WBC 5.8 5.8 6.0 5.2 5.5  NEUTROABS  --   --  3.7  --   --   HGB 9.7* 10.2* 9.9* 9.3* 9.6*  HCT 32.3* 34.3* 33.0* 30.9* 32.4*  MCV 79.6* 80.7 80.9 82.4 81.8  PLT 228 177 227 183 234    Cardiac Enzymes: No results for input(s): CKTOTAL, CKMB, CKMBINDEX, TROPONINI in the last 168 hours.  BNP: BNP (last 3 results) Recent Labs    10/20/19 2029  BNP 1,623.1*    ProBNP (last 3 results) No results for input(s): PROBNP in the last 8760 hours.   CBG: Recent Labs  Lab 10/28/19 0800 10/28/19 1203 10/28/19 1703  10/28/19 2240 10/29/19 0744  GLUCAP 160* 234* 200* 186* 144*    Coagulation Studies: No results for input(s): LABPROT, INR in the last 72 hours.   Imaging    No results found.   Medications:     Current Medications: . amiodarone  400 mg Oral BID   Followed by  . [START ON 10/31/2019] amiodarone  200 mg Oral Daily  . hydrALAZINE  10 mg Oral Q6H  . insulin aspart  0-5 Units Subcutaneous QHS  . insulin aspart  0-9 Units Subcutaneous TID WC  . insulin glargine  12 Units Subcutaneous Daily  . isosorbide mononitrate  15 mg Oral Daily  . metolazone  10 mg Oral BID     Infusions: . albumin human Stopped (10/29/19 0015)  . ferumoxytol 510 mg (10/26/19 1328)  . furosemide 66 mL/hr at 10/29/19 0600  . heparin 1,900 Units/hr (10/29/19 0600)        Assessment/Plan   1. Fall, R hip pain Xray was negative.   2. Acute Systolic Heart Failure BNP >1600 on admit. ECHO this admit  EF 20-25%, pericardial effusion, mild LVH, RA/LA dilated.  RV . 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  HS Trop low. Would not pursue LHC with elevated creatinine.  - Marked volume overload despite high dose IV lasix + metolazone 10 mg twice a day.   - Cut back BB No bb, dig, entesto, spiro with elevated creatinine.   3. A fib RVR - On Admit. Placed  on amio drip + heparin drip.  - Chemically converted to NSR   4. HTN  - On bidil - No insurance later will need to switch to hydralazine/imdur due to cost.   5. AKI  Creatinine on admit 1.76.  Creatinine worsened with diuresis. Marked volume overload. Todays creatinine 3.4 . Nephrology consulted and following closely.   - Urology saw--. Bilateral staghorn calculi. No hydronephrosis so no role for percutaneous nephrostomy tubes.    6. Uncontrolled DM Hgb A1C 11.7  On SS. Per primary team.   7. Uterine Mass Likely fibroid per GYN. Needs outpatient follow up.   Dr [name] discussed with Dr Jonnie Finner. Plan for HD catheter today for dialysis.    Length of Stay: [place], NP  10/29/2019, 10:28 AM  Advanced Heart Failure Team Pager [phone] (M-F; 7a - 4p)  Please contact [facility] for night-coverage after hours (4p -7a ) and weekends on [email or web address]  Agree with above.  53 y/o woman with morbid obesity, HTN, CKD 3a, and DM2. Admitted to [facility] ~ 1 week ago with new onset systolic HF with severe biventricular dysfunction EF 20-25% with severe RV dysfunction.   Now with marked volume overload (> 50 pounds) with worsening renal function in setting of attempts at IV diuresis.   General: Morbidly obese woman Lying in bed.  HEENT: normal Neck: supple. JVP to ear . Carotids 2+ bilat; no bruits. No lymphadenopathy or thyromegaly appreciated. Cor: PMI nondisplaced. Regular rate & rhythm. No rubs, gallops or murmurs. Lungs: clear Abdomen: obese soft, nontender, + distended. No hepatosplenomegaly. No bruits or masses. Good bowel sounds. Extremities: no cyanosis, clubbing, rash, 3-4+ edema into thigh Neuro: alert & orientedx3, cranial nerves grossly intact. moves all 4 extremities w/o difficulty. Affect pleasant  She has massive volume overload in setting on new onset systolic HF with severe biventricular dysfunction. Now c/b cardiorenal syndrome and AKI/CKD. I discussed the management with Dr. Jonnie Finner in Renal. I think options are obtaining central access and trying to manage HF with inotropic support versus moving straight to CVVHD. Given amount of volume on board, I think best option is to proceed with CVVHD for volume removal. Will eventually need R/L cath as permitted.   CRITICAL CARE Performed by: [name]  Total critical care time: 55 minutes  Critical care time was exclusive of separately billable procedures and treating other patients.  Critical care was necessary to treat or prevent imminent or life-threatening deterioration.  Critical care was time spent personally by me (independent of midlevel providers or  residents) on the following activities: development of treatment plan with patient and/or surrogate as well as nursing, discussions with consultants, evaluation of patient's response to treatment, examination of patient, obtaining history from patient or surrogate, ordering and performing treatments and interventions, ordering and review of laboratory studies, ordering and review of radiographic studies, pulse oximetry and re-evaluation of patient's condition.  [name], MD  5:19 PM

## 2019-10-29 NOTE — Progress Notes (Addendum)
Cardiology Progress Note  Patient ID: Adrienne Lane MRN: [id number] DOB: 28-Apr-1966 Date of Encounter: 10/29/2019  Primary Cardiologist: Donato Heinz, MD  Subjective   No chest pain, breathing a little better than yesterday, but still very volume overloaded from abd down  Inpatient Medications  Scheduled Meds: . amiodarone  400 mg Oral BID   Followed by  . [START ON 10/31/2019] amiodarone  200 mg Oral Daily  . insulin aspart  0-5 Units Subcutaneous QHS  . insulin aspart  0-9 Units Subcutaneous TID WC  . insulin glargine  12 Units Subcutaneous Daily  . metolazone  10 mg Oral BID  . metoprolol tartrate  50 mg Oral BID   Continuous Infusions: . albumin human Stopped (10/29/19 0015)  . ferumoxytol 510 mg (10/26/19 1328)  . furosemide 66 mL/hr at 10/29/19 0600  . heparin 1,900 Units/hr (10/29/19 0600)   PRN Meds: acetaminophen **OR** acetaminophen, ondansetron (ZOFRAN) IV   Vital Signs   Vitals:   10/27/19 2144 10/28/19 0505 10/28/19 2051 10/29/19 0459  BP: 111/69 108/72 114/66 114/81  Pulse: 64 64 77 64  Resp: 19 16 17 17   Temp: (!) 97.5 F (36.4 C) 97.8 F (36.6 C) 98.3 F (36.8 C) (!) 97.5 F (36.4 C)  TempSrc:  Oral Oral Oral  SpO2: 100% 96% 98% 99%  Weight:  (!) 154.8 kg  (!) 154.6 kg  Height:        Intake/Output Summary (Last 24 hours) at 10/29/2019 0856 Last data filed at 10/29/2019 0600 Gross per 24 hour  Intake 1904.2 ml  Output 1425 ml  Net 479.2 ml   Last 3 Weights 10/29/2019 10/28/2019 10/27/2019  Weight (lbs) 340 lb 12.8 oz 341 lb 3.2 oz 336 lb 12.8 oz  Weight (kg) 154.586 kg 154.767 kg 152.771 kg      Telemetry   SR, PVCs are frequent, episodes bigeminy  ECG   None today  Physical Exam   Vitals:   10/27/19 2144 10/28/19 0505 10/28/19 2051 10/29/19 0459  BP: 111/69 108/72 114/66 114/81  Pulse: 64 64 77 64  Resp: 19 16 17 17   Temp: (!) 97.5 F (36.4 C) 97.8 F (36.6 C) 98.3 F (36.8 C) (!) 97.5 F (36.4 C)  TempSrc:  Oral  Oral Oral  SpO2: 100% 96% 98% 99%  Weight:  (!) 154.8 kg  (!) 154.6 kg  Height:         Intake/Output Summary (Last 24 hours) at 10/29/2019 0856 Last data filed at 10/29/2019 0600 Gross per 24 hour  Intake 1904.2 ml  Output 1425 ml  Net 479.2 ml    Last 3 Weights 10/29/2019 10/28/2019 10/27/2019  Weight (lbs) 340 lb 12.8 oz 341 lb 3.2 oz 336 lb 12.8 oz  Weight (kg) 154.586 kg 154.767 kg 152.771 kg    Body mass index is 51.82 kg/m.   General: Well developed, obese, female in no acute distress Head: Eyes PERRLA, Head normocephalic and atraumatic Lungs: decreased BS bases bilaterally to auscultation. Heart: regularly irregular R&R, S1 S2, without rub or gallop. No murmur. Upper extremity pulses are 2+ & equal. JVD to jaw Abdomen: Bowel sounds are present, abdomen firm and slightly tender without masses or  hernias noted. Msk: Normal strength and tone for age. Extremities: No clubbing, cyanosis, 2-3+ LE edema.    Skin:  No rashes or lesions noted. Neuro: Alert and oriented X 3. Psych:  Good affect, responds appropriately   Labs  High Sensitivity Troponin:   Recent Labs  Lab 10/20/19 2028  10/20/19 2245  TROPONINIHS 103* 83*      Chemistry Recent Labs  Lab 10/22/19 0925  10/27/19 0545 10/28/19 0437 10/29/19 0403  NA 138   < > 139 137 138  K 3.4*   < > 4.0 4.1 4.1  CL 104   < > 104 102 105  CO2 23   < > 23 23 22   GLUCOSE 226*   < > 197* 178* 167*  BUN 37*   < > 55* 59* 60*  CREATININE 1.98*   < > 2.89* 3.37* 3.42*  CALCIUM 8.8*   < > 8.7* 9.1 9.5  PROT 6.5  --   --   --   --   ALBUMIN 2.8*   < > 2.9* 3.7 4.0  AST 23  --   --   --   --   ALT 22  --   --   --   --   ALKPHOS 69  --   --   --   --   BILITOT 1.2  --   --   --   --   GFRNONAA 29*   < > 18* 15* 15*  GFRAA 33*   < > 21* 17* 17*  ANIONGAP 11   < > 12 12 11    < > = values in this interval not displayed.    Hematology Recent Labs  Lab 10/26/19 0434 10/27/19 0411 10/29/19 0403  WBC 6.0 5.2 5.5  RBC  4.08 3.75* 3.96  HGB 9.9* 9.3* 9.6*  HCT 33.0* 30.9* 32.4*  MCV 80.9 82.4 81.8  MCH 24.3* 24.8* 24.2*  MCHC 30.0 30.1 29.6*  RDW 18.9* 19.2* 19.3*  PLT 227 183 234   BNP No results for input(s): BNP, PROBNP in the last 168 hours.  DDimer No results for input(s): DDIMER in the last 168 hours.   Radiology  US Renal  Result Date: 10/28/2019 CLINICAL DATA:  Acute kidney injury EXAM: RENAL / URINARY TRACT ULTRASOUND COMPLETE COMPARISON:  CT abdomen/pelvis dated 10/23/2019 FINDINGS: Right Kidney: Renal measurements: 11.4 x 5.9 x 5.2 cm = volume: 183 mL. Large staghorn calculus throughout the renal pelvis, better visualized on CT. No definite hydronephrosis, although poorly evaluated. Left Kidney: Renal measurements: 11.2 x 5.3 x 5.5 cm = volume: 171 mL. Large staghorn calculus throughout the renal pelvis, better visualized on CT. No definite hydronephrosis, although poorly evaluated. Bladder: Decompressed by indwelling Foley catheter. Other: None. IMPRESSION: Large bilateral renal staghorn calculi, better visualized on CT. No definite hydronephrosis, although poorly evaluated. Bladder decompressed by indwelling Foley catheter. Electronically Signed   By: Julian Hy M.D.   On: 10/28/2019 10:22   Dg Chest Port 1 View  Result Date: 10/28/2019 CLINICAL DATA:  Shortness of breath, anasarca EXAM: PORTABLE CHEST 1 VIEW COMPARISON:  None. FINDINGS: Low lung volumes. No focal consolidation. No pleural effusion or pneumothorax. Cardiomegaly. IMPRESSION: No evidence of acute cardiopulmonary disease. Electronically Signed   By: Julian Hy M.D.   On: 10/28/2019 10:23    Cardiac Studies  TTE 10/21/2019 1. Left ventricular ejection fraction, by visual estimation, is 20 to 25%. The left ventricle has severely decreased function. There is mildly increased left ventricular hypertrophy.  2. Left ventricular diastolic parameters are indeterminate.  3. The left ventricle demonstrates global hypokinesis.   4. Global right ventricle has normal systolic function.The right ventricular size is mildly enlarged. No increase in right ventricular wall thickness.  5. Left atrial size was moderately dilated.  6. Right atrial size was  moderately dilated.  7. The pericardial effusion is circumferential.  8. Trivial pericardial effusion is present.  9. The mitral valve is normal in structure. Mild to moderate mitral valve regurgitation. No evidence of mitral stenosis. 10. The tricuspid valve is normal in structure. Tricuspid valve regurgitation moderate-severe. 11. The aortic valve is normal in structure. Aortic valve regurgitation is not visualized. No evidence of aortic valve sclerosis or stenosis. 12. The pulmonic valve was normal in structure. Pulmonic valve regurgitation is not visualized. 13. Moderately elevated pulmonary artery systolic pressure. 14. The tricuspid regurgitant velocity is 3.29 m/s, and with an assumed right atrial pressure of 15 mmHg, the estimated right ventricular systolic pressure is moderately elevated at 58.3 mmHg. 15. The inferior vena cava is dilated in size with <50% respiratory variability, suggesting right atrial pressure of 15 mmHg.  CT A/P  1. Imaging quality is significantly degraded due to patient body habitus with resulting central photon starvation. Evaluation is furthermore complicated by lack of intravenous contrast material. 2. Marked enlargement of the uterine fundus measuring up to 17 cm in size, with heterogeneous attenuation and a an irregular curvilinear air lucency towards the superior aspect of this masslike enlargement. 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 Recommend further evaluation with pelvic ultrasound. 3. Extensive bilateral staghorn calculi. No visible hydronephrosis. Difficult to follow the course of either ureter given lack of contrast and fluid extensive fluid in the abdomen. 4. Severe anasarca with extensive body wall edema,  mesenteric congestion, ascites, and right pleural effusion. 5. Circumferential thickening of the urinary bladder. Correlate with urinalysis to exclude cystitis. 6. Marked cardiomegaly with four-chamber enlargement. 7. Regions of mosaic attenuation in the lower lungs may reflect areas of air trapping versus imaging during exhalation. 8. Aortic Atherosclerosis (ICD10-I70.0).   Patient Profile   Adrienne Lane a 53 y.o.femalewith history of diabetes(A1c 11.1),CKD, obesity who was admitted on 11/1 for new onset systolic heart failure as well as atrial fibrillation with RVR. This is a new diagnosis for her.  Assessment & Plan   1.New onset systolic heart failure, ejection fraction 20 to 25%, with gross volume overload  - pt found to be grossly volume overloaded w/ EF 20% on admit 10/31, new dx - thinks sx began in June-July, but no hx CP, no precipitating event - currently on Lasix 160 mg IV q 6 hr, and metolazone 10 mg qd, per Nephrology - Bidil d/c'd - on Lopressor 50 mg bid, discuss changing to Toprol XL 100 mg qd w/ MD  2.Atrial fibrillation with RVR, paroxysmal  - being loaded w/ Amio, w/ 400 mg bid x 7 d>>200 mg qd x 21 days and stop - on heparin>>DOAC when renal function stabilized  3. AKI on CKD - bilateral Staghorn calculi seen>>watch for hydronephrosis - Nephrology managing diuretics - Cr 1.76 on admit>>3.42 today - Uterine fibroid seen, GYN has seen, fibroid is stable, f/u after hospitalization  Otherwise, per IM Principal Problem:   Atrial fibrillation with rapid ventricular response (HCC) Active Problems:   CHF (congestive heart failure) (HCC)   Elevated troponin   AKI (acute kidney injury) ([place])   Hyperglycemia   Atrial fibrillation with RVR (HCC)   Right hip pain   Type 2 diabetes mellitus without complication ([place])   Essential hypertension   Uterine fibroid    For questions or updates, please contact [facility] Please consult  [email or web address] for contact info under    Rosaria Ferries, PA-C 10/29/2019 9:02 AM Beeper [phone]  As above, patient seen and examined.  Patient has had gradual worsening dyspnea and bilateral lower extremity edema since June.  I have personally reviewed the patient's echocardiogram and she has severe global reduction in LV systolic function.  She remains markedly volume overloaded on examination.  Despite high-dose diuretics little progress is being made in improving volume status.  Her renal function is also deteriorating.  She has converted to sinus rhythm on amiodarone.  1 acute systolic congestive heart failure-patient remains markedly volume overloaded.  Renal function is worsening despite high-dose diuretics.  I have discussed the patient with Dr. [name].  We will transfer to [facility] to ICU bed.  She will likely need inotropes.  Follow [facility].  May ultimately require dialysis.  2 cardiomyopathy-etiology unclear.  Patient was in atrial fibrillation at time of admission and therefore could be tachycardia mediated.  She was also very hypertensive and not taking medications.  There is no history of alcohol abuse.  She does have a long history of diabetes mellitus and therefore coronary artery disease could also be contributing.  We will discontinue metoprolol now that patient is in sinus rhythm (patient with acute systolic CHF).  Avoid ARB or Entresto in setting of worsening renal function.  Will also avoid digoxin and spironolactone for same reason.  Add hydralazine 10 mg p.o. 3 times daily and Imdur 30 mg daily.  Titrate as tolerated.  She will ultimately require cardiac catheterization but will avoid for now in the setting of worsening renal function.  3 acute on chronic stage III kidney disease-renal function is worsening with aggressive diuresis.  Nephrology following.  May require dialysis if inotropes do not improve creatinine.  103  4 noncompliance-patient was not taking  medications at time of admission due to financial reasons.  We discussed the importance of compliance with all medications.  5 hypertension-follow blood pressure with changes as outlined above.  Titrate medications as needed.  6 paroxysmal atrial fibrillation-she remains in sinus rhythm.  Continue amiodarone at present dose.  Continue heparin.  She will need transition to apixaban once all procedures complete.  Kirk Ruths, MD

## 2019-10-29 NOTE — Progress Notes (Signed)
ANTICOAGULATION CONSULT NOTE  Pharmacy Consult for IV heparin Indication: atrial fibrillation  No Known Allergies  Patient Measurements: Height: 5\' 8"  (172.7 cm) Weight: (!) 340 lb 12.8 oz (154.6 kg) IBW/kg (Calculated) : 63.9 Heparin Dosing Weight: 100.6 kg  Vital Signs: Temp: 97.9 F (36.6 C) (11/09 1147) Temp Source: Oral (11/09 1147) BP: 160/85 (11/09 1319) Pulse Rate: 80 (11/09 1319)  Labs: Recent Labs    10/27/19 0411 10/27/19 0545 10/28/19 0437 10/29/19 0403  HGB 9.3*  --   --  9.6*  HCT 30.9*  --   --  32.4*  PLT 183  --   --  234  HEPARINUNFRC 0.41  --  0.30 0.42  CREATININE 2.87* 2.89* 3.37* 3.42*    Estimated Creatinine Clearance: 30.8 mL/min (A) (by C-G formula based on SCr of 3.42 mg/dL (H)).   Assessment: 35 yoF with PMH DM2, HTN, presents with R hip pain after falling. Also reports several months of LE edema, orthopnea, and abdominal distention. Found to have elevated BNP and to also be in AFib with RVR. Pharmacy to dose heparin  Heparin therapeutic this morning at 0.42, CBC stable. Pt transferred to [facility] for CRRT and evaluation by HF team. Heparin paused on transfer - will resume prior rate.  Goal of Therapy: Heparin level 0.3-0.7 units/ml Monitor platelets by anticoagulation protocol: Yes  Plan: -Restart heparin 1900 units/hr -Check heparin level with daily labs   Arrie Senate, PharmD, BCPS Clinical Pharmacist [phone] Please check AMION for all [facility] Pharmacy numbers 10/29/2019

## 2019-10-29 NOTE — Progress Notes (Signed)
PT Cancellation Note  Patient Details Name: Adrienne Lane MRN: [id number] DOB: 1965-12-26   Cancelled Treatment:    Reason Eval/Treat Not Completed: Patient not medically ready  Pt has transferred to [facility].     Mikael Spray Amea Mcphail 10/29/2019, 5:21 PM

## 2019-10-29 NOTE — Progress Notes (Signed)
Report called to [facility]H for transfer.

## 2019-10-29 NOTE — Progress Notes (Signed)
PROGRESS NOTE  Adrienne Lane GYJ:[id number] DOB: 1966-01-28 DOA: 10/20/2019 PCP: Patient, No Pcp Per   LOS: 9 days   Brief Narrative / Interim history: Patient is a 53 year old African-American female, morbidly obese, with past medical history significant for diabetes mellitus type 2, hypertension, uterine fibroids, chronic leg edema/anasarca and query history of atrial fibrillation not on any medication.  Patient was admitted with right-sided hip pain following a fall.  Patient had not seen a provider for the last 1 to 2 years due to lack of insurance, and was not compliant with her medication, including insulin.  Patient developed progressive leg edema with increased abdominal girth over the last 4-5 months.  On presentation to the hospital, patient was found to be in atrial fibrillation with rapid ventricular response.  Echocardiogram done revealed EF of 20-25 %, global hypokinesis and RVSP of 58.3 mmHg.  Cardiology input is appreciated.  Significantly, despite severe anasarca, patient is not on oxygen.  Worsening renal function is noted with bilateral staghorn calculi.  UA revealed protein, few bacteria, greater than 50 RBC and greater than 50 WBC.  Renal ultrasound was negative for obstruction.  CT scan revealed bilateral staghorn calculi.  Albumin was low.  Despite aggressive diuresis, there was no improvement in patient's weight.  Renal function continued to worsen.  Nephrology team was consulted.  Patient has been on IV albumin, IV Lasix and oral metolazone with no significant change in patient's weight.  Rising creatinine noted.  Nephrology also initiated GN work-up, but no significant findings to date.  Approximately 1 g proteinuria was noted.    10/29/2019: Cardiology team has decided to transfer patient to [facility] for further cardiac intervention.   Subjective: No shortness of breath at rest. Reports some improvement in urine output.  Assessment & Plan: Principal  Problem:   Atrial fibrillation with rapid ventricular response (HCC) Active Problems:   CHF (congestive heart failure) (HCC)   Elevated troponin   AKI (acute kidney injury) ([place])   Hyperglycemia   Atrial fibrillation with RVR (HCC)   Right hip pain   Type 2 diabetes mellitus without complication (HCC)   Essential hypertension   Uterine fibroid   A. fib with RVR: -Heart rate is controlled.   -Continue anticoagulation with heparin drip.   -Significantly low EF noted on echocardiogram could be secondary to tachycardia cardiomyopathy.  -Cardiology input is appreciated.  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 -Etiology of progressive edema/anasarca likely multifactorial. -Cardiology input is appreciated. -Baseline EF is not known.  Echocardiogram done on 10/21/2019 revealed EF of 20 to 25%.  Cannot entirely rule out tachycardia induced cardiomyopathy (patient was on rapid A. fib on presentation) 10/29/2019: The plan is to transfer patient to [facility] for further cardiac management.    Acute kidney injury: -Patient's creatinine in 2019 was 0.9-1.0 range with labs done at [facility]. -Creatinine this admission 1.7, with diuresis gradually increasing and now it is 2.4.  Hold further Lasix. -Renal ultrasound negative for hydronephrosis  -Bilateral staghorn calculi noted. -Nephrology team has been consulted.  For possible GN work-up. 10/26/2019: Add 3-day course of metolazone.  Monitor renal function and electrolytes while on metolazone.  Continue to monitor urine output.  Serum creatinine today is 2.86.  LDL is 62. 10/27/2019: Serum creatinine seems to have peaked.  Continue current regimen.  Follow work-up.  Nephrology team is directing. 10/28/2019: According to the patient, urine output is improving.  The  dose of metolazone has been increased to 10 mg p.o. twice daily by nephrology.  Nephrology team is also increased the dose of IV Lasix to 160 mg every 6 hours.  Serum creatinine today is 3.37.  This is worsening.  Will defer further management due to the nephrology team. 10/29/2019: Despite maximum diuretics, patient's weight has not changed.  Massive edema persists.  Bilateral staghorn renal calculi -Urology consulted, no need for interventions at this point. 10/29/2019: Kindly have a low threshold to reconsult urology team.  Type 2 diabetes mellitus: -Poorly controlled, with hyperglycemia.  -Continue sliding scale and Lantus -Continue to optimize.  Right hip pain secondary to mechanical fall -Negative imaging for fractures  Normocytic anemia -Chronic disease  Uterine mass -CT scan of the abdomen and pelvis showed marked enlargement of the uterine fundus measuring up to 17 cm with heterogeneous attenuation and irregular curvilinear air lucency towards the superior aspect of this masslike and large amount.  This is concerning for endometrial carcinoma. -Patient apparently is aware of having uterine mass, she was told last year at [facility] that she is having uterine fibroids -Gynecology team consulted.  For further management on outpatient basis.  Scheduled Meds: . amiodarone  400 mg Oral BID   Followed by  . [START ON 10/31/2019] amiodarone  200 mg Oral Daily  . hydrALAZINE  10 mg Oral Q6H  . insulin aspart  0-5 Units Subcutaneous QHS  . insulin aspart  0-9 Units Subcutaneous TID WC  . insulin glargine  12 Units Subcutaneous Daily  . isosorbide mononitrate  15 mg Oral Daily  . metolazone  10 mg Oral BID   Continuous Infusions: . albumin human 25 g (10/29/19 1129)  . ferumoxytol 510 mg (10/26/19 1328)  . furosemide 66 mL/hr at 10/29/19 0600  . heparin 1,900 Units/hr (10/29/19 0600)   PRN Meds:.acetaminophen **OR** acetaminophen, ondansetron (ZOFRAN) IV  DVT prophylaxis:  heparin Code Status: Full code Family Communication:  Disposition Plan: This will depend on hospital course.  Consultants:  Cardiology  Urology  Nephrology  Procedures:   2D echo 10/21/2019  Plain films of the right hip and pelvis 10/20/2019  CXR 10/20/2019  Renal ultrasound 10/22/2019  Microbiology  None   Antimicrobials: None     Objective: Vitals:   10/28/19 2051 10/29/19 0459 10/29/19 1147 10/29/19 1208  BP: 114/66 114/81 120/69 (!) 128/100  Pulse: 77 64 71 70  Resp: 17 17 16    Temp: 98.3 F (36.8 C) (!) 97.5 F (36.4 C) 97.9 F (36.6 C)   TempSrc: Oral Oral Oral   SpO2: 98% 99% 100% 100%  Weight:  (!) 154.6 kg    Height:        Intake/Output Summary (Last 24 hours) at 10/29/2019 1305 Last data filed at 10/29/2019 1255 Gross per 24 hour  Intake 1358.2 ml  Output 2325 ml  Net -966.8 ml   Filed Weights   10/27/19 0701 10/28/19 0505 10/29/19 0459  Weight: (!) 152.8 kg (!) 154.8 kg (!) 154.6 kg    Examination:  Constitutional: Patient is morbidly obese.  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   Eyes: Pallor.  No jaundice.  ENMT: Mucous membranes are moist.  JVD is equivocal. Respiratory: clear to auscultation bilaterally Cardiovascular: S1-S2, irregular.   Abdomen: Mildly obese, pitting edema, organs are difficult to assess. Musculoskeletal: 2+ to 3 lower extremity edema/Anasarca.  Abdominal wall edema. Neurologic:  Awake and alert.  Patient moves all extremities.  Data Reviewed: I have independently reviewed following labs and imaging studies   CBC: Recent Labs  Lab 10/24/19 0410 10/25/19 0441 10/26/19 0434 10/27/19 0411 10/29/19 0403  WBC 5.8 5.8 6.0 5.2 5.5  NEUTROABS  --   --  3.7  --   --   HGB 9.7* 10.2* 9.9* 9.3* 9.6*  HCT 32.3* 34.3* 33.0* 30.9* 32.4*  MCV 79.6* 80.7 80.9 82.4 81.8  PLT 228 177 227 183 338   Basic Metabolic Panel: Recent Labs  Lab 10/23/19 0424  10/26/19 0434 10/27/19 0411 10/27/19 0527 10/27/19 0545 10/28/19  0437 10/29/19 0403  NA 137   < > 138 138  --  139 137 138  K 4.6   < > 4.4 3.9  --  4.0 4.1 4.1  CL 106   < > 103 105  --  104 102 105  CO2 20*   < > 22 21*  --  23 23 22   GLUCOSE 221*   < > 213* 214*  --  197* 178* 167*  BUN 42*   < > 51* 57*  --  55* 59* 60*  CREATININE 2.24*   < > 2.86* 2.87*  --  2.89* 3.37* 3.42*  CALCIUM 8.6*   < > 8.9 8.8*  --  8.7* 9.1 9.5  MG 2.0  --  2.2  --  1.9  --  2.0 2.0  PHOS  --    < > 4.6 4.9*  --  5.0* 4.9* 5.0*   < > = values in this interval not displayed.   GFR: Estimated Creatinine Clearance: 30.8 mL/min (A) (by C-G formula based on SCr of 3.42 mg/dL (H)). Liver Function Tests: Recent Labs  Lab 10/26/19 0434 10/27/19 0411 10/27/19 0545 10/28/19 0437 10/29/19 0403  ALBUMIN 2.9* 3.0* 2.9* 3.7 4.0   No results for input(s): LIPASE, AMYLASE in the last 168 hours. No results for input(s): AMMONIA in the last 168 hours. Coagulation Profile: No results for input(s): INR, PROTIME in the last 168 hours. Cardiac Enzymes: No results for input(s): CKTOTAL, CKMB, CKMBINDEX, TROPONINI in the last 168 hours. BNP (last 3 results) No results for input(s): PROBNP in the last 8760 hours. HbA1C: No results for input(s): HGBA1C in the last 72 hours. CBG: Recent Labs  Lab 10/28/19 1203 10/28/19 1703 10/28/19 2240 10/29/19 0744 10/29/19 1135  GLUCAP 234* 200* 186* 144* 193*   Lipid Profile: No results for input(s): CHOL, HDL, LDLCALC, TRIG, CHOLHDL, LDLDIRECT in the last 72 hours. Thyroid Function Tests: No results for input(s): TSH, T4TOTAL, FREET4, T3FREE, THYROIDAB in the last 72 hours. Anemia Panel: No results for input(s): VITAMINB12, FOLATE, FERRITIN, TIBC, IRON, RETICCTPCT in the last 72 hours. Urine analysis:    Component Value Date/Time   COLORURINE YELLOW 10/28/2019 1542   APPEARANCEUR CLOUDY (A) 10/28/2019 1542   LABSPEC 1.006 10/28/2019 1542   PHURINE 7.0 10/28/2019 1542   GLUCOSEU NEGATIVE 10/28/2019 1542   HGBUR MODERATE (A)  10/28/2019 1542   BILIRUBINUR NEGATIVE 10/28/2019 1542   KETONESUR NEGATIVE 10/28/2019 1542   PROTEINUR 100 (A) 10/28/2019 1542   NITRITE NEGATIVE 10/28/2019 1542   LEUKOCYTESUR LARGE (A) 10/28/2019 1542   Sepsis Labs: Invalid input(s): PROCALCITONIN, LACTICIDVEN  Recent Results (from the past 240 hour(s))  SARS CORONAVIRUS 2 (TAT 6-24 HRS) Nasopharyngeal Nasopharyngeal Swab     Status: None   Collection Time: 10/20/19 10:45 PM   Specimen: Nasopharyngeal Swab  Result Value Ref Range Status  SARS Coronavirus 2 NEGATIVE NEGATIVE Final    Comment: (NOTE) SARS-CoV-2 target nucleic acids are NOT DETECTED. The SARS-CoV-2 RNA is generally detectable in upper and lower respiratory specimens during the acute phase of infection. Negative results do not preclude SARS-CoV-2 infection, do not rule out co-infections with other pathogens, and should not be used as the sole basis for treatment or other patient management decisions. Negative results must be combined with clinical observations, patient history, and epidemiological information. The expected result is Negative. Fact Sheet for Patients: SugarRoll.be Fact Sheet for Healthcare Providers: https://www.woods-mathews.com/ This test is not yet approved or cleared by the [place] FDA and  has been authorized for detection and/or diagnosis of SARS-CoV-2 by FDA under an Emergency Use Authorization (EUA). This EUA will remain  in effect (meaning this test can be used) for the duration of the COVID-19 declaration under Section 56 4(b)(1) of the Act, 21 U.S.C. section 360bbb-3(b)(1), unless the authorization is terminated or revoked sooner. Performed at [facility], [place] [address], [place], [place] [address]   Culture, Urine     Status: Abnormal   Collection Time: 10/23/19 11:51 AM   Specimen: Urine, Catheterized  Result Value Ref Range Status   Specimen Description   Final    URINE,  CATHETERIZED Performed at [facility] [address] [place], [place] [address]    Special Requests   Final    NONE Performed at [facility], [place] [address] [place], [place] [address]    Culture MULTIPLE SPECIES PRESENT, SUGGEST RECOLLECTION (A)  Final   Report Status 10/24/2019 FINAL  Final  Culture, Urine     Status: None (Preliminary result)   Collection Time: 10/27/19 12:17 AM   Specimen: Urine, Random  Result Value Ref Range Status   Specimen Description   Final    URINE, RANDOM Performed at [facility] [address], [place], [place] [address]    Special Requests   Final    NONE Performed at [facility], [place] [address], [place], [place] [address]    Culture   Final    CULTURE REINCUBATED FOR BETTER GROWTH Performed at [facility], [place] [address], [place], [place] [address]    Report Status PENDING  Incomplete      Radiology Studies: US Renal  Result Date: 10/28/2019 CLINICAL DATA:  Acute kidney injury EXAM: RENAL / URINARY TRACT ULTRASOUND COMPLETE COMPARISON:  CT abdomen/pelvis dated 10/23/2019 FINDINGS: Right Kidney: Renal measurements: 11.4 x 5.9 x 5.2 cm = volume: 183 mL. Large staghorn calculus throughout the renal pelvis, better visualized on CT. No definite hydronephrosis, although poorly evaluated. Left Kidney: Renal measurements: 11.2 x 5.3 x 5.5 cm = volume: 171 mL. Large staghorn calculus throughout the renal pelvis, better visualized on CT. No definite hydronephrosis, although poorly evaluated. Bladder: Decompressed by indwelling Foley catheter. Other: None. IMPRESSION: Large bilateral renal staghorn calculi, better visualized on CT. No definite hydronephrosis, although poorly evaluated. Bladder decompressed by indwelling Foley catheter. Electronically Signed   By: Julian Hy M.D.   On: 10/28/2019 10:22   Dg Chest Port 1 View  Result Date: 10/28/2019 CLINICAL DATA:   Shortness of breath, anasarca EXAM: PORTABLE CHEST 1 VIEW COMPARISON:  None. FINDINGS: Low lung volumes. No focal consolidation. No pleural effusion or pneumothorax. Cardiomegaly. IMPRESSION: No evidence of acute cardiopulmonary disease. Electronically Signed   By: Julian Hy M.D.   On: 10/28/2019 10:23   Dana Allan MD. [facility]  [facility]  Contact via  [email or web address]  [place] P: [phone] F: [phone]

## 2019-10-29 NOTE — Progress Notes (Signed)
ANTICOAGULATION CONSULT NOTE  Pharmacy Consult for IV heparin Indication: atrial fibrillation  No Known Allergies  Patient Measurements: Height: 5\' 8"  (172.7 cm) Weight: (!) 340 lb 12.8 oz (154.6 kg) IBW/kg (Calculated) : 63.9 Heparin Dosing Weight: 100.6 kg  Vital Signs: Temp: 97.5 F (36.4 C) (11/09 0459) Temp Source: Oral (11/09 0459) BP: 114/81 (11/09 0459) Pulse Rate: 64 (11/09 0459)  Labs: Recent Labs    10/27/19 0411 10/27/19 0545 10/28/19 0437 10/29/19 0403  HGB 9.3*  --   --  9.6*  HCT 30.9*  --   --  32.4*  PLT 183  --   --  234  HEPARINUNFRC 0.41  --  0.30 0.42  CREATININE 2.87* 2.89* 3.37* 3.42*    Estimated Creatinine Clearance: 30.8 mL/min (A) (by C-G formula based on SCr of 3.42 mg/dL (H)).   Assessment: 57 yoF with PMH DM2, HTN, presents with R hip pain after falling. Also reports several months of LE edema, orthopnea, and abdominal distention. Found to have elevated BNP and to also be in AFib with RVR. Pharmacy to dose heparin   Baseline INR, aPTT: elevated; charted as being drawn before heparin started, but RN notes report drawing aPTT/INR while heparin running  Prior anticoagulation: none  Today,10/29/2019 -Heparin level therapeutic and stable at rate 1900 units/hr -Plt wnl, Hgb low, but stable, Scr still trending up -No bleeding reported, per nursing  Goal of Therapy: Heparin level 0.3-0.7 units/ml Monitor platelets by anticoagulation protocol: Yes  Plan:  Continue Heparin drip at 1900 units/hr  Daily CBC, daily Heparin level   Monitor SCr  Monitor for signs of bleeding or thrombosis  Plan transfer to [facility] for cardiac ICU level care  Neville Route, PharmD Student 10/29/2019 8:56 AM

## 2019-10-29 NOTE — TOC Progression Note (Signed)
Transition of Care [facility]) - Progression Note    Patient Details  Name: Iqra Rotundo MRN: [id number] Date of Birth: 10/22/1966  Transition of Care [facility]) CM/SW Contact  Purcell Mouton, RN Phone Number: 10/29/2019, 11:40 AM  Clinical Narrative:    Pt will transfer to [facility]. Appointment on 11/17 at 1010 AM at [place]. Pt is aware anf encouraged to keep this appointment.    Expected Discharge Plan: Home/Self Care Barriers to Discharge: No Barriers Identified  Expected Discharge Plan and Services Expected Discharge Plan: Home/Self Care       Living arrangements for the past 2 months: Single Family Home                                       Social Determinants of Health (SDOH) Interventions    Readmission Risk Interventions No flowsheet data found.

## 2019-10-30 LAB — RENAL FUNCTION PANEL
Albumin: 4 g/dL (ref 3.5–5.0)
Albumin: 4 g/dL (ref 3.5–5.0)
Anion gap: 10 (ref 5–15)
Anion gap: 12 (ref 5–15)
BUN: 49 mg/dL — ABNORMAL HIGH (ref 6–20)
BUN: 39 mg/dL — ABNORMAL HIGH (ref 6–20)
CO2: 25 mmol/L (ref 22–32)
CO2: 25 mmol/L (ref 22–32)
Calcium: 9.6 mg/dL (ref 8.9–10.3)
Calcium: 9.3 mg/dL (ref 8.9–10.3)
Chloride: 102 mmol/L (ref 98–111)
Chloride: 100 mmol/L (ref 98–111)
Creatinine, Ser: 2.87 mg/dL — ABNORMAL HIGH (ref 0.44–1.00)
Creatinine, Ser: 2.49 mg/dL — ABNORMAL HIGH (ref 0.44–1.00)
GFR calc Af Amer: 21 mL/min — ABNORMAL LOW (ref >60)
GFR calc Af Amer: 25 mL/min — ABNORMAL LOW (ref >60)
GFR calc non Af Amer: 18 mL/min — ABNORMAL LOW (ref >60)
GFR calc non Af Amer: 22 mL/min — ABNORMAL LOW (ref >60)
Glucose, Bld: 164 mg/dL — ABNORMAL HIGH (ref 70–99)
Glucose, Bld: 181 mg/dL — ABNORMAL HIGH (ref 70–99)
Phosphorus: 4.1 mg/dL (ref 2.5–4.6)
Phosphorus: 3.5 mg/dL (ref 2.5–4.6)
Potassium: 3.8 mmol/L (ref 3.5–5.1)
Potassium: 3.7 mmol/L (ref 3.5–5.1)
Sodium: 137 mmol/L (ref 135–145)
Sodium: 137 mmol/L (ref 135–145)

## 2019-10-30 LAB — ANTINUCLEAR ANTIBODIES, IFA: ANA Ab, IFA: NEGATIVE

## 2019-10-30 LAB — COOXEMETRY PANEL
Carboxyhemoglobin: 1.8 % — ABNORMAL HIGH (ref 0.5–1.5)
Methemoglobin: 0.8 % (ref 0.0–1.5)
O2 Saturation: 56.9 %
Total hemoglobin: 9.9 g/dL — ABNORMAL LOW (ref 12.0–16.0)

## 2019-10-30 LAB — MAGNESIUM: Magnesium: 2 mg/dL (ref 1.7–2.4)

## 2019-10-30 LAB — CBC
HCT: 32.2 % — ABNORMAL LOW (ref 36.0–46.0)
Hemoglobin: 9.9 g/dL — ABNORMAL LOW (ref 12.0–15.0)
MCH: 24.4 pg — ABNORMAL LOW (ref 26.0–34.0)
MCHC: 30.7 g/dL (ref 30.0–36.0)
MCV: 79.5 fL — ABNORMAL LOW (ref 80.0–100.0)
Platelets: 219 10*3/uL (ref 150–400)
RBC: 4.05 MIL/uL (ref 3.87–5.11)
RDW: 19.6 % — ABNORMAL HIGH (ref 11.5–15.5)
WBC: 5.8 10*3/uL (ref 4.0–10.5)
nRBC: 0.3 % — ABNORMAL HIGH (ref 0.0–0.2)

## 2019-10-30 LAB — GLUCOSE, CAPILLARY
Glucose-Capillary: 184 mg/dL — ABNORMAL HIGH (ref 70–99)
Glucose-Capillary: 187 mg/dL — ABNORMAL HIGH (ref 70–99)
Glucose-Capillary: 171 mg/dL — ABNORMAL HIGH (ref 70–99)
Glucose-Capillary: 161 mg/dL — ABNORMAL HIGH (ref 70–99)

## 2019-10-30 LAB — HEPARIN LEVEL (UNFRACTIONATED): Heparin Unfractionated: 0.34 IU/mL (ref 0.30–0.70)

## 2019-10-30 LAB — APTT: aPTT: 88 seconds — ABNORMAL HIGH (ref 24–36)

## 2019-10-30 MED ORDER — SODIUM CHLORIDE 0.9% FLUSH
10.0000 mL | INTRAVENOUS | Status: DC | PRN
  Administered 2019-10-30 (×2): 10 mL
  Filled 2019-10-30 (×2): qty 40

## 2019-10-30 MED ORDER — SODIUM CHLORIDE 0.9% FLUSH
10.0000 mL | Freq: Two times a day (BID) | INTRAVENOUS | Status: DC
  Administered 2019-10-30 – 2019-11-12 (×5): 10 mL

## 2019-10-30 NOTE — Progress Notes (Addendum)
Advanced Heart Failure Rounding Note  PCP-Cardiologist: Donato Heinz, MD   Subjective:    Transferred from [facility] to [facility] for HF management. Marked volume overload. Started on CVVHD. >4 liters of urine over night. Weight trending down 340>322 pounds.   Feeling a little better. Denies SOB.    Objective:   Weight Range: (!) 150.6 kg Body mass index is 50.48 kg/m.   Vital Signs:   Temp:  [97.4 F (36.3 C)-98.4 F (36.9 C)] 97.5 F (36.4 C) (11/10 0600) Pulse Rate:  [42-82] 67 (11/10 0645) Resp:  [13-30] 14 (11/10 0645) BP: (113-160)/(67-103) 137/86 (11/10 0645) SpO2:  [90 %-100 %] 99 % (11/10 0645) Weight:  [150.6 kg] 150.6 kg (11/10 0401) Last BM Date: 10/29/19  Weight change: Filed Weights   10/28/19 0505 10/29/19 0459 10/30/19 0401  Weight: (!) 154.8 kg (!) 154.6 kg (!) 150.6 kg    Intake/Output:   Intake/Output Summary (Last 24 hours) at 10/30/2019 0732 Last data filed at 10/30/2019 0700 Gross per 24 hour  Intake 1294.47 ml  Output 4981 ml  Net -3686.53 ml      Physical Exam   CVP 23-24  General:  No resp difficulty HEENT: Normal Neck: Supple. JVP to jaw . Carotids 2+ bilat; no bruits. No lymphadenopathy or thyromegaly appreciated. Cor: PMI nondisplaced. Regular rate & rhythm. No rubs, gallops or murmurs. Lungs: Clear Abdomen: obese, soft, nontender, nondistended. No hepatosplenomegaly. No bruits or masses. Good bowel sounds. Extremities: No cyanosis, clubbing, rash, Ra dnLLE 2+ edema Neuro: Alert & orientedx3, cranial nerves grossly intact. moves all 4 extremities w/o difficulty. Affect pleasant   Telemetry   SR PVCs/PACs   EKG    N/A   Labs    CBC Recent Labs    10/29/19 0403 10/30/19 0418  WBC 5.5 5.8  HGB 9.6* 9.9*  HCT 32.4* 32.2*  MCV 81.8 79.5*  PLT 234 191   Basic Metabolic Panel Recent Labs    10/29/19 0403 10/30/19 0418  NA 138 137  K 4.1 3.8  CL 105 102  CO2 22 25  GLUCOSE 167* 164*  BUN 60* 49*   CREATININE 3.42* 2.87*  CALCIUM 9.5 9.6  MG 2.0 2.0  PHOS 5.0* 4.1   Liver Function Tests Recent Labs    10/29/19 0403 10/30/19 0418  ALBUMIN 4.0 4.0   No results for input(s): LIPASE, AMYLASE in the last 72 hours. Cardiac Enzymes No results for input(s): CKTOTAL, CKMB, CKMBINDEX, TROPONINI in the last 72 hours.  BNP: BNP (last 3 results) Recent Labs    10/20/19 2029  BNP 1,623.1*    ProBNP (last 3 results) No results for input(s): PROBNP in the last 8760 hours.   D-Dimer No results for input(s): DDIMER in the last 72 hours. Hemoglobin A1C No results for input(s): HGBA1C in the last 72 hours. Fasting Lipid Panel No results for input(s): CHOL, HDL, LDLCALC, TRIG, CHOLHDL, LDLDIRECT in the last 72 hours. Thyroid Function Tests No results for input(s): TSH, T4TOTAL, T3FREE, THYROIDAB in the last 72 hours.  Invalid input(s): FREET3  Other results:   Imaging    Dg Chest 1 View  Result Date: 10/29/2019 CLINICAL DATA:  Central line placement EXAM: CHEST  1 VIEW COMPARISON:  October 28, 2011 FINDINGS: The lung volumes are low. The heart size is enlarged. A central venous catheter is noted with tip terminating near the cavoatrial junction. There is no pneumothorax. There is vascular congestion with possible early pulmonary edema. IMPRESSION: 1. Well-positioned right-sided central venous  catheter without evidence for pneumothorax. 2. Low lung volumes. 3. Cardiomegaly with vascular congestion and probable early developing pulmonary edema. Electronically Signed   By: Constance Holster M.D.   On: 10/29/2019 18:27      Medications:     Scheduled Medications: . amiodarone  400 mg Oral BID   Followed by  . [START ON 10/31/2019] amiodarone  200 mg Oral Daily  . Chlorhexidine Gluconate Cloth  6 each Topical Daily  . hydrALAZINE  10 mg Oral Q6H  . insulin aspart  0-5 Units Subcutaneous QHS  . insulin aspart  0-9 Units Subcutaneous TID WC  . insulin glargine  12 Units  Subcutaneous Daily  . isosorbide mononitrate  15 mg Oral Daily  . sodium chloride flush  10-40 mL Intracatheter Q12H     Infusions: .  prismasol BGK 4/2.5 400 mL/hr at 10/29/19 2106  .  prismasol BGK 4/2.5 300 mL/hr at 10/29/19 2105  . albumin human Stopped (10/30/19 0000)  . ferumoxytol 510 mg (10/26/19 1328)  . furosemide 66 mL/hr at 10/30/19 0700  . heparin 1,900 Units/hr (10/30/19 0700)  . prismasol BGK 4/2.5 1,500 mL/hr at 10/30/19 0704     PRN Medications:  acetaminophen **OR** acetaminophen, alteplase, heparin, HYDROcodone-acetaminophen, ondansetron (ZOFRAN) IV, sodium chloride, sodium chloride flush     Assessment/Plan   1. Fall, R hip pain Xray was negative.   2. Acute Systolic Heart Failure BNP >1600 on admit. ECHO this admit  EF 20-25%, pericardial effusion, mild LVH, RA/LA dilated.  RV . 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  HS Trop low. Would not pursue LHC with elevated creatinine.  - Marked volume overload despite high dose IV lasix + metolazone 10 mg twice a day.   - Started CVVHD on 11/9. CVP trending down from 23-24.  - Cut back BB No bb, dig, entesto, spiro with elevated creatinine.   3. A fib RVR - On Admit. Placed on amio drip + heparin drip.  - Chemically converted to NSR  - Maintaining NSR  4. HTN  - On bidil - No insurance later will need to switch to hydralazine/imdur due to cost.  - Stable.   5. AKI  Creatinine on admit 1.76.  Creatinine worsened with diuresis. Marked volume overload. Started on CVVHD on 11/9.  Making over 4 liters of urine.  Nephrology consulted and following closely.   - Urology saw--. Bilateral staghorn calculi. No hydronephrosis so no role for percutaneous nephrostomy tubes.    6. Uncontrolled DM Hgb A1C 11.7  On SS. Per primary team.   7. Uterine Mass Likely fibroid per GYN. Needs outpatient follow up.   8. Anemia  Hgb 9.9. No obvious source of bleeding.   Length of Stay: [place], NP   10/30/2019, 7:32 AM  Advanced Heart Failure Team Pager [phone] (M-F; 7a - 4p)  Please contact [facility] for night-coverage after hours (4p -7a ) and weekends on [email or web address]   Agree with above.   Started on CVVHD last night with excellent response. Urine output has now also picked up dramatically. CVP initially 32 now 23-24. Co-ox inaccurate. Repeating this am.  Weight down 18 pounds. Fatigued. But denies SOB, orthopnea or PND.   General:  Sitting up in bed No resp difficulty HEENT: normal Neck: supple. RIJ trialysis cath Carotids 2+ bilat; no bruits. No lymphadenopathy or thryomegaly appreciated. Cor: PMI nonpalpable Regular rate & rhythm. No rubs, gallops or murmurs. Lungs: clear Abdomen: obese soft, nontender, + distended. No hepatosplenomegaly. No bruits or masses.  Good bowel sounds. Extremities: no cyanosis, clubbing, rash, 4+ edema Neuro: alert & orientedx3, cranial nerves grossly intact. moves all 4 extremities w/o difficulty. Affect pleasant  She is improving with CVVHD. Renal function picking up with renal unloading. Weight down 18 pounds. Will continue CVVHD and diuresis. Check co-ox. With CVP so high I worry about significant PH. Need to make sure we don't drop her pressure or diurese too quickly so as not to add to her renal injury. Will need R +/- L heart cath when fluid status improved. Continue heparin for PAF. Place UNNA boots.   CRITICAL CARE Performed by: [name]  Total critical care time: 35 minutes  Critical care time was exclusive of separately billable procedures and treating other patients.  Critical care was necessary to treat or prevent imminent or life-threatening deterioration.  Critical care was time spent personally by me (independent of midlevel providers or residents) on the following activities: development of treatment plan with patient and/or surrogate as well as nursing, discussions with consultants, evaluation of patient's response to  treatment, examination of patient, obtaining history from patient or surrogate, ordering and performing treatments and interventions, ordering and review of laboratory studies, ordering and review of radiographic studies, pulse oximetry and re-evaluation of patient's condition.  [name], MD  7:53 AM

## 2019-10-30 NOTE — Progress Notes (Signed)
Physical Therapy Treatment Patient Details Name: Adrienne Lane MRN: [id number] DOB: 01/16/1966 Today's Date: 10/30/2019    History of Present Illness 53 yo female admitted to ED on 10/29 with fall out of minivan and R hip and LE pain. Xray negative for fracture. Pt found to be in afib with RVR, sHF (new diagnosis). PMH includes DM with A1C 11.7, HTN. Pt started on CRRT on 11/9    PT Comments    Pt tolerated treatment well, requiring increased assistance to perform bed mobility and transfers. Pt ambulation limited by CRRT to protect line integrity. RN present in room for mobility and monitoring lines. Pt continues to demonstrate LE weakness, as well as gait and balance deficits and will benefit from continued acute PT services to reduce falls risk.   Follow Up Recommendations  Home health PT;Supervision/Assistance - 24 hour     Equipment Recommendations  None recommended by PT(will continue to assess)    Recommendations for Other Services       Precautions / Restrictions Precautions Precautions: Fall Restrictions Weight Bearing Restrictions: No    Mobility  Bed Mobility Overal bed mobility: Needs Assistance Bed Mobility: Supine to Sit;Sit to Supine     Supine to sit: Mod assist Sit to supine: Mod assist      Transfers Overall transfer level: Needs assistance Equipment used: 2 person hand held assist Transfers: Sit to/from Stand Sit to Stand: Mod assist            Ambulation/Gait Ambulation/Gait assistance: Min guard Gait Distance (Feet): 0 Feet(pt marching in place for ~2 minutes) Assistive device: 1 person hand held assist Gait Pattern/deviations: (marching in place)     General Gait Details: Pt marching in place, initially with BUe support of PT progressing to no UE support   Stairs             Wheelchair Mobility    Modified Rankin (Stroke Patients Only)       Balance Overall balance assessment: Needs assistance Sitting-balance support:  No upper extremity supported;Feet supported Sitting balance-Leahy Scale: Good Sitting balance - Comments: supervision of 1 person   Standing balance support: No upper extremity supported Standing balance-Leahy Scale: Fair Standing balance comment: minG-close supervision                            Cognition Arousal/Alertness: Awake/alert Behavior During Therapy: WFL for tasks assessed/performed Overall Cognitive Status: Within Functional Limits for tasks assessed                                        Exercises      General Comments General comments (skin integrity, edema, etc.): VSS throughout session on room air, RN monitoring CRRT lines      Pertinent Vitals/Pain Pain Assessment: Faces Faces Pain Scale: Hurts little more Pain Location: CRRT cath site Pain Descriptors / Indicators: Grimacing;Guarding;Sharp Pain Intervention(s): Limited activity within patient's tolerance;Monitored during session    Home Living                      Prior Function            PT Goals (current goals can now be found in the care plan section) Acute Rehab PT Goals Patient Stated Goal: return home with family Progress towards PT goals: Not progressing toward goals - comment(pt started on CRRT limiting  mobility this session)    Frequency    Min 3X/week      PT Plan Current plan remains appropriate    Co-evaluation              AM-PAC PT "6 Clicks" Mobility   Outcome Measure  Help needed turning from your back to your side while in a flat bed without using bedrails?: A Lot Help needed moving from lying on your back to sitting on the side of a flat bed without using bedrails?: A Lot Help needed moving to and from a bed to a chair (including a wheelchair)?: A Lot Help needed standing up from a chair using your arms (e.g., wheelchair or bedside chair)?: A Little Help needed to walk in hospital room?: A Little Help needed climbing 3-5 steps  with a railing? : A Lot 6 Click Score: 14    End of Session Equipment Utilized During Treatment: (none) Activity Tolerance: Patient tolerated treatment well Patient left: in bed;with call bell/phone within reach;with nursing/sitter in room Nurse Communication: Mobility status PT Visit Diagnosis: Other abnormalities of gait and mobility (R26.89);History of falling (Z91.81);Unsteadiness on feet (R26.81)     Time: 0086-7619 PT Time Calculation (min) (ACUTE ONLY): 23 min  Charges:  $Gait Training: 8-22 mins $Therapeutic Activity: 8-22 mins                     Zenaida Niece, PT, DPT Acute Rehabilitation Pager: [phone]    Zenaida Niece 10/30/2019, 9:49 AM

## 2019-10-30 NOTE — Progress Notes (Signed)
[facility] Progress Note  Subjective: BP up slightly. 4.9 L out yest , combination of UF and UOP  Vitals:   10/30/19 1445 10/30/19 1500 10/30/19 1515 10/30/19 1522  BP:  (!) 145/93 (!) 153/91   Pulse: (!) 38 75 73   Resp: 20 20 (!) 27   Temp:    (!) 97.4 F (36.3 C)  TempSrc:    Oral  SpO2: 100% 99% 100%   Weight:      Height:        Inpatient medications: . amiodarone  400 mg Oral BID   Followed by  . [START ON 10/31/2019] amiodarone  200 mg Oral Daily  . Chlorhexidine Gluconate Cloth  6 each Topical Daily  . hydrALAZINE  10 mg Oral Q6H  . insulin aspart  0-5 Units Subcutaneous QHS  . insulin aspart  0-9 Units Subcutaneous TID WC  . insulin glargine  12 Units Subcutaneous Daily  . isosorbide mononitrate  15 mg Oral Daily  . sodium chloride flush  10-40 mL Intracatheter Q12H   .  prismasol BGK 4/2.5 400 mL/hr at 10/30/19 0925  .  prismasol BGK 4/2.5 300 mL/hr at 10/30/19 1400  . albumin human 60 mL/hr at 10/30/19 1118  . ferumoxytol 510 mg (10/26/19 1328)  . furosemide Stopped (10/30/19 1233)  . heparin 1,900 Units/hr (10/30/19 1500)  . prismasol BGK 4/2.5 1,500 mL/hr at 10/30/19 1318   acetaminophen **OR** acetaminophen, alteplase, heparin, HYDROcodone-acetaminophen, ondansetron (ZOFRAN) IV, sodium chloride, sodium chloride flush    Exam: Gen: morbidly obese AAF in NAD seen in bed in room CVS: no rub Resp: decreased BS Abd: obese, +BS, soft, NT Ext: 2-3 + doughy edema LE's bilat    CXR today - clear  Renal US - 11 cm kidneys, large bilateral renal staghorn calculi, better visualized on CT. No definite hydronephrosis, although poorly evaluated. Bladder decompressed by indwelling Foley catheter.   CT renal stone 11/3 > The kidneys demonstrate extensive bilateral staghorn calculi. No visible hydronephrosis. Difficult to follow the course of either ureter given lack of contrast and fluid in the abdomen. No contour deforming renal lesions are seen. There  is circumferential bladder wall thickening.   ECHO 10/21/19 >  Left Ventricle: Left ventricular ejection fraction, by visual estimation, is 20 to 25%. The left ventricle has severely decreased function. The left ventricle demonstrates global hypokinesis. There is mildly increased left ventricular hypertrophy. Left ventricular diastolic parameters are indeterminate. Normal left atrial pressure.   UA 11/3 > > 50 rbc/ wbc per hpf, prot 100   Urine prot-creat ratio = 1.0   UNa 87, UCr 39 on 11/7 (on lasix)   Assessment/Plan:  1. AKI/CKD- in setting of large, bilateral staghorn calculi, anasarca, atrial fibrillation with RVR, and new low EF 20--25%. Her lasix was held due to rising Scr and we were consulted to help assist with diuresis and lasix was restarted at higher dose w/ IV albumin. Serologic w/u showed ANCA's are both slightly up, anti-DNA neg, ASO neg, C3/C4 wnl,  SPEP and serum FLC negative. Urine PC ratio = 1.0.  - patient did not improve w/ highest dosing IV diuretics > transferred to CHF service at [facility] and is now on CRRT, appreciate CHF team - lowered UF goal to 100 cc/hr approx - still making urine and getting lasix 160 qid IV 2. HTN - bp's up, back on hydral/ imdur 3. New onset, acute systolic CHF EF 16-07%, per cards 4. Atrial fibrillation- rate controlled with amiodarone. Currently on  heparin drip 5. Bilateral staghorn calculi- evaluated by Urology. No evidence of hydronephrosis so no indication for nephrostomy tubes 6. DM-poorly controlled with Hgb A1c 11.7%.per primary 7. Right hip pain s/p mechanical fall- no fx 8. Anemia- normocytic.Low iron will replete and follow 9. Uterine mass- is a large fibroid no change from CT in 2019  Kelly Splinter, MD 10/27/2019, 10:09 PM  Iron/TIBC/Ferritin/ %Sat    Component Value Date/Time   IRON 44 10/21/2019 0703   TIBC 355 10/21/2019 0703   FERRITIN 20 10/21/2019 0703   IRONPCTSAT 12 10/21/2019 0703   Recent Labs  Lab  10/30/19 1516  NA 137  K 3.7  CL 100  CO2 25  GLUCOSE 181*  BUN 39*  CREATININE 2.49*  CALCIUM 9.3  PHOS 3.5  ALBUMIN 4.0   No results for input(s): AST, ALT, ALKPHOS, BILITOT, PROT in the last 168 hours. Recent Labs  Lab 10/30/19 0418  WBC 5.8  HGB 9.9*  HCT 32.2*  PLT 219

## 2019-10-30 NOTE — Progress Notes (Signed)
Orthopedic Tech Progress Note Patient Details:  Adrienne Lane 04-08-1966 [id number]  Ortho Devices Type of Ortho Device: Unna boot, Ace wrap Ortho Device/Splint Location: Bilateral unna boots Ortho Device/Splint Interventions: Application   Post Interventions Patient Tolerated: Well Instructions Provided: Care of device   [place] Pink 10/30/2019, 6:21 PM

## 2019-10-30 NOTE — Progress Notes (Signed)
[place] for IV heparin Indication: atrial fibrillation  No Known Allergies  Patient Measurements: Height: 5\' 8"  (172.7 cm) Weight: (!) 332 lb 0.2 oz (150.6 kg) IBW/kg (Calculated) : 63.9 Heparin Dosing Weight: 100.6 kg  Vital Signs: Temp: 97.5 F (36.4 C) (11/10 1111) Temp Source: Oral (11/10 1111) BP: 152/82 (11/10 1345) Pulse Rate: 83 (11/10 1400)  Labs: Recent Labs    10/28/19 0437 10/29/19 0403 10/30/19 0418  HGB  --  9.6* 9.9*  HCT  --  32.4* 32.2*  PLT  --  234 219  APTT  --   --  88*  HEPARINUNFRC 0.30 0.42 0.34  CREATININE 3.37* 3.42* 2.87*    Estimated Creatinine Clearance: 36.1 mL/min (A) (by C-G formula based on SCr of 2.87 mg/dL (H)).   Assessment: 37 yoF with PMH DM2, HTN, presents with R hip pain after falling. Also reports several months of LE edema, orthopnea, and abdominal distention. Found to have elevated BNP and to also be in AFib with RVR. Pharmacy to dose heparin  Heparin therapeutic this morning at 0.34, CBC stable.   .  prismasol BGK 4/2.5 400 mL/hr at 10/30/19 0925  .  prismasol BGK 4/2.5 300 mL/hr at 10/30/19 1400  . albumin human 60 mL/hr at 10/30/19 1118  . ferumoxytol 510 mg (10/26/19 1328)  . furosemide Stopped (10/30/19 1233)  . heparin 1,900 Units/hr (10/30/19 1400)  . prismasol BGK 4/2.5 1,500 mL/hr at 10/30/19 1318    Goal of Therapy: Heparin level 0.3-0.7 units/ml Monitor platelets by anticoagulation protocol: Yes  Plan: -Restart heparin 1900 units/hr -Check heparin level with daily labs   Marguerite Olea, [facility] Clinical Pharmacist Phone [phone]  10/30/2019 2:02 PM

## 2019-10-30 NOTE — TOC Initial Note (Signed)
Transition of Care [facility]) - Initial/Assessment Note    Patient Details  Name: Adrienne Lane MRN: [id number] Date of Birth: Sep 16, 1966  [facility] CM/SW Contact:    Bethena Roys, RN Phone Number: 10/30/2019, 3:39 PM  Clinical Narrative: Pt transitioned from [facility] to unit 2[facility]. PTA pt is from home with family. Presented for Afib with RVR- was on CRRT during visit. Previous CM has set her up with PCP @ [facility]. Patient had questions regarding diabetes medications- Patient will benefit from Diabetes Educator consult regarding medications- she is without insurance; however, she does work. CM will continue to follow for additional transition of care needs as she progresses.                 Expected Discharge Plan: [place] Barriers to Discharge: Continued Medical Work up   Patient Goals and CMS Choice Patient states their goals for this hospitalization and ongoing recovery are:: "to feel better"      Expected Discharge Plan and Services Expected Discharge Plan: [place]   Discharge Planning Services: Follow-up appt scheduled, CM Consult Post Acute Care Choice: Home Health Living arrangements for the past 2 months: Single Family Home                   Prior Living Arrangements/Services Living arrangements for the past 2 months: Single Family Home Lives with:: Spouse Patient language and need for interpreter reviewed:: Yes Do you feel safe going back to the place where you live?: Yes      Need for Family Participation in Patient Care: Yes (Comment) Care giver support system in place?: Yes (comment)   Criminal Activity/Legal Involvement Pertinent to Current Situation/Hospitalization: No - Comment as needed  Activities of Daily Living Home Assistive Devices/Equipment: None ADL Screening (condition at time of admission) Patient's cognitive ability adequate to safely complete daily activities?:  Yes Is the patient deaf or have difficulty hearing?: No Does the patient have difficulty seeing, even when wearing glasses/contacts?: No Does the patient have difficulty concentrating, remembering, or making decisions?: No Patient able to express need for assistance with ADLs?: Yes Does the patient have difficulty dressing or bathing?: No Independently performs ADLs?: Yes (appropriate for developmental age) Does the patient have difficulty walking or climbing stairs?: Yes Weakness of Legs: Both Weakness of Arms/Hands: Both  Permission Sought/Granted Permission sought to share information with : Family Supports     Emotional Assessment Appearance:: Appears stated age Attitude/Demeanor/Rapport: Engaged Affect (typically observed): Accepting Orientation: : Oriented to Self, Oriented to Place, Oriented to  Time, Oriented to Situation Alcohol / Substance Use: Not Applicable Psych Involvement: No (comment)  Admission diagnosis:  Hyperglycemia [R73.9] Elevated troponin [R77.8] Right hip pain [M25.551] Atrial fibrillation with RVR ([place]) [I48.91] Essential hypertension [I10] Patient Active Problem List   Diagnosis Date Noted  . Uterine fibroid 10/25/2019  . Right hip pain   . Type 2 diabetes mellitus without complication ([place])   . Essential hypertension   . CHF (congestive heart failure) ([place]) 10/21/2019  . Elevated troponin 10/21/2019  . AKI (acute kidney injury) ([place]) 10/21/2019  . Hyperglycemia 10/21/2019  . Atrial fibrillation with RVR ([place]) 10/21/2019  . Atrial fibrillation with rapid ventricular response ([place]) 10/20/2019   PCP:  Patient, No Pcp Per Pharmacy:   [facility] [address] ([address]), [place] [address] [place] ([place]) [place] [address] Phone: [phone] Fax: [phone]  Social Determinants of Health (SDOH) Interventions    Readmission Risk Interventions No flowsheet data found.

## 2019-10-31 LAB — CBC
HCT: 29.9 % — ABNORMAL LOW (ref 36.0–46.0)
HCT: 30 % — ABNORMAL LOW (ref 36.0–46.0)
Hemoglobin: 9.3 g/dL — ABNORMAL LOW (ref 12.0–15.0)
Hemoglobin: 9.5 g/dL — ABNORMAL LOW (ref 12.0–15.0)
MCH: 24.9 pg — ABNORMAL LOW (ref 26.0–34.0)
MCH: 25.2 pg — ABNORMAL LOW (ref 26.0–34.0)
MCHC: 31.1 g/dL (ref 30.0–36.0)
MCHC: 31.7 g/dL (ref 30.0–36.0)
MCV: 80.2 fL (ref 80.0–100.0)
MCV: 79.6 fL — ABNORMAL LOW (ref 80.0–100.0)
Platelets: 94 10*3/uL — ABNORMAL LOW (ref 150–400)
Platelets: 95 10*3/uL — ABNORMAL LOW (ref 150–400)
RBC: 3.73 MIL/uL — ABNORMAL LOW (ref 3.87–5.11)
RBC: 3.77 MIL/uL — ABNORMAL LOW (ref 3.87–5.11)
RDW: 20 % — ABNORMAL HIGH (ref 11.5–15.5)
RDW: 20 % — ABNORMAL HIGH (ref 11.5–15.5)
WBC: 5.4 10*3/uL (ref 4.0–10.5)
WBC: 5.5 10*3/uL (ref 4.0–10.5)
nRBC: 0 % (ref 0.0–0.2)
nRBC: 0 % (ref 0.0–0.2)

## 2019-10-31 LAB — APTT
aPTT: 124 seconds — ABNORMAL HIGH (ref 24–36)
aPTT: 86 seconds — ABNORMAL HIGH (ref 24–36)

## 2019-10-31 LAB — RENAL FUNCTION PANEL
Albumin: 3.9 g/dL (ref 3.5–5.0)
Albumin: 3.9 g/dL (ref 3.5–5.0)
Anion gap: 12 (ref 5–15)
Anion gap: 10 (ref 5–15)
BUN: 29 mg/dL — ABNORMAL HIGH (ref 6–20)
BUN: 24 mg/dL — ABNORMAL HIGH (ref 6–20)
CO2: 26 mmol/L (ref 22–32)
CO2: 26 mmol/L (ref 22–32)
Calcium: 9.4 mg/dL (ref 8.9–10.3)
Calcium: 9.3 mg/dL (ref 8.9–10.3)
Chloride: 99 mmol/L (ref 98–111)
Chloride: 100 mmol/L (ref 98–111)
Creatinine, Ser: 1.91 mg/dL — ABNORMAL HIGH (ref 0.44–1.00)
Creatinine, Ser: 1.75 mg/dL — ABNORMAL HIGH (ref 0.44–1.00)
GFR calc Af Amer: 35 mL/min — ABNORMAL LOW (ref >60)
GFR calc Af Amer: 38 mL/min — ABNORMAL LOW (ref >60)
GFR calc non Af Amer: 30 mL/min — ABNORMAL LOW (ref >60)
GFR calc non Af Amer: 33 mL/min — ABNORMAL LOW (ref >60)
Glucose, Bld: 154 mg/dL — ABNORMAL HIGH (ref 70–99)
Glucose, Bld: 151 mg/dL — ABNORMAL HIGH (ref 70–99)
Phosphorus: 2.8 mg/dL (ref 2.5–4.6)
Phosphorus: 2.6 mg/dL (ref 2.5–4.6)
Potassium: 3.5 mmol/L (ref 3.5–5.1)
Potassium: 3.8 mmol/L (ref 3.5–5.1)
Sodium: 137 mmol/L (ref 135–145)
Sodium: 136 mmol/L (ref 135–145)

## 2019-10-31 LAB — COOXEMETRY PANEL
Carboxyhemoglobin: 2.1 % — ABNORMAL HIGH (ref 0.5–1.5)
Methemoglobin: 1.1 % (ref 0.0–1.5)
O2 Saturation: 62.7 %
Total hemoglobin: 13.2 g/dL (ref 12.0–16.0)

## 2019-10-31 LAB — HEPARIN LEVEL (UNFRACTIONATED)
Heparin Unfractionated: 0.19 IU/mL — ABNORMAL LOW (ref 0.30–0.70)
Heparin Unfractionated: 0.22 IU/mL — ABNORMAL LOW (ref 0.30–0.70)
Heparin Unfractionated: 0.36 IU/mL (ref 0.30–0.70)

## 2019-10-31 LAB — GLUCOSE, CAPILLARY
Glucose-Capillary: 145 mg/dL — ABNORMAL HIGH (ref 70–99)
Glucose-Capillary: 176 mg/dL — ABNORMAL HIGH (ref 70–99)
Glucose-Capillary: 136 mg/dL — ABNORMAL HIGH (ref 70–99)
Glucose-Capillary: 181 mg/dL — ABNORMAL HIGH (ref 70–99)

## 2019-10-31 LAB — MAGNESIUM: Magnesium: 2.1 mg/dL (ref 1.7–2.4)

## 2019-10-31 MED ORDER — ISOSORBIDE MONONITRATE ER 30 MG PO TB24
30.0000 mg | ORAL_TABLET | Freq: Every day | ORAL | Status: DC
  Administered 2019-11-01 – 2019-11-02 (×2): 30 mg via ORAL
  Filled 2019-10-31 (×2): qty 1

## 2019-10-31 MED ORDER — FUROSEMIDE 10 MG/ML IJ SOLN
160.0000 mg | Freq: Three times a day (TID) | INTRAVENOUS | Status: DC
  Administered 2019-10-31 – 2019-11-04 (×11): 160 mg via INTRAVENOUS
  Filled 2019-10-31: qty 10
  Filled 2019-10-31 (×11): qty 16
  Filled 2019-10-31: qty 10

## 2019-10-31 MED ORDER — DOCUSATE SODIUM 100 MG PO CAPS: 200.0000 mg | ORAL_CAPSULE | Freq: Every day | ORAL | Status: DC | PRN

## 2019-10-31 MED ORDER — AMIODARONE HCL 200 MG PO TABS
200.0000 mg | ORAL_TABLET | Freq: Two times a day (BID) | ORAL | Status: DC
  Administered 2019-10-31 – 2019-11-06 (×12): 200 mg via ORAL
  Filled 2019-10-31 (×12): qty 1

## 2019-10-31 MED ORDER — GERHARDT'S BUTT CREAM
TOPICAL_CREAM | CUTANEOUS | Status: DC | PRN
  Administered 2019-10-31: 21:00:00 via TOPICAL
  Filled 2019-10-31: qty 1

## 2019-10-31 NOTE — Progress Notes (Signed)
Advanced Heart Failure Rounding Note  PCP-Cardiologist: Donato Heinz, MD   Subjective:    Transferred from [facility] to [facility] for HF management. Marked volume overload. Started on CVVHD.  Remains on CVVHD and also high-dose lasis. Weight down 4 pounds overnight.  U/o good   Feels bloated. UNNA boots bothering her. No orthopnea or PND.    Objective:   Weight Range: (!) 149.1 kg Body mass index is 49.98 kg/m.   Vital Signs:   Temp:  [97.4 F (36.3 C)-98.3 F (36.8 C)] 98.1 F (36.7 C) (11/11 0700) Pulse Rate:  [38-90] 78 (11/11 0900) Resp:  [14-32] 20 (11/11 0900) BP: (116-169)/(68-120) 131/92 (11/11 0900) SpO2:  [95 %-100 %] 95 % (11/11 0900) Weight:  [149.1 kg] 149.1 kg (11/11 0500) Last BM Date: 10/29/19  Weight change: Filed Weights   10/29/19 0459 10/30/19 0401 10/31/19 0500  Weight: (!) 154.6 kg (!) 150.6 kg (!) 149.1 kg    Intake/Output:   Intake/Output Summary (Last 24 hours) at 10/31/2019 1016 Last data filed at 10/31/2019 1000 Gross per 24 hour  Intake 1310.1 ml  Output 5278 ml  Net -3967.9 ml      Physical Exam   CVP 22 General: Sitting up in bed HEENT: normal Neck: supple. JVP up RIJ trialysis Carotids 2+ bilat; no bruits. No lymphadenopathy or thryomegaly appreciated. Cor: PMI nondisplaced. Regular rate & rhythm. No rubs, gallops or murmurs. Lungs: clear Abdomen: obese soft, nontender, nondistended. No hepatosplenomegaly. No bruits or masses. Good bowel sounds. Extremities: no cyanosis, clubbing, rash, 3-4+ edema into thighs + unna boots Neuro: alert & orientedx3, cranial nerves grossly intact. moves all 4 extremities w/o difficulty. Affect pleasant    Telemetry   SR 80-90s PVCs/PACs PVC burden 15-20 per min Personally reviewed  Labs    CBC Recent Labs    10/31/19 0512 10/31/19 0816  WBC 5.4 5.5  HGB 9.3* 9.5*  HCT 29.9* 30.0*  MCV 80.2 79.6*  PLT 94* 95*   Basic Metabolic Panel Recent Labs    10/30/19 0418 10/30/19  1516 10/31/19 0512  NA 137 137 137  K 3.8 3.7 3.5  CL 102 100 99  CO2 25 25 26   GLUCOSE 164* 181* 154*  BUN 49* 39* 29*  CREATININE 2.87* 2.49* 1.91*  CALCIUM 9.6 9.3 9.4  MG 2.0  --  2.1  PHOS 4.1 3.5 2.8   Liver Function Tests Recent Labs    10/30/19 1516 10/31/19 0512  ALBUMIN 4.0 3.9   No results for input(s): LIPASE, AMYLASE in the last 72 hours. Cardiac Enzymes No results for input(s): CKTOTAL, CKMB, CKMBINDEX, TROPONINI in the last 72 hours.  BNP: BNP (last 3 results) Recent Labs    10/20/19 2029  BNP 1,623.1*    ProBNP (last 3 results) No results for input(s): PROBNP in the last 8760 hours.   D-Dimer No results for input(s): DDIMER in the last 72 hours. Hemoglobin A1C No results for input(s): HGBA1C in the last 72 hours. Fasting Lipid Panel No results for input(s): CHOL, HDL, LDLCALC, TRIG, CHOLHDL, LDLDIRECT in the last 72 hours. Thyroid Function Tests No results for input(s): TSH, T4TOTAL, T3FREE, THYROIDAB in the last 72 hours.  Invalid input(s): FREET3  Other results:   Imaging    No results found.   Medications:     Scheduled Medications: . amiodarone  200 mg Oral Daily  . Chlorhexidine Gluconate Cloth  6 each Topical Daily  . hydrALAZINE  10 mg Oral Q6H  . insulin aspart  0-5 Units Subcutaneous QHS  . insulin aspart  0-9 Units Subcutaneous TID WC  . insulin glargine  12 Units Subcutaneous Daily  . isosorbide mononitrate  15 mg Oral Daily  . sodium chloride flush  10-40 mL Intracatheter Q12H    Infusions: .  prismasol BGK 4/2.5 400 mL (10/30/19 2057)  .  prismasol BGK 4/2.5 300 mL/hr at 10/31/19 0743  . ferumoxytol 510 mg (10/26/19 1328)  . furosemide Stopped (10/31/19 1761)  . heparin 1,900 Units/hr (10/31/19 1000)  . prismasol BGK 4/2.5 1,500 mL/hr at 10/31/19 0729    PRN Medications: acetaminophen **OR** acetaminophen, alteplase, heparin, HYDROcodone-acetaminophen, ondansetron (ZOFRAN) IV, sodium chloride, sodium  chloride flush     Assessment/Plan   1. Acute Systolic Heart Failure - BNP >1600 on admit. ECHO this admit  EF 20-25%, pericardial effusion, mild LVH, RA/LA dilated.  RV . 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 . - Marked volume overload despite high dose IV lasix + metolazone 10 mg twice a day.   - Started CVVHD on 11/9. CVP initially 32 now trending down to 22 - Since CVVHD started urine output has picked up as well. Continue diuresis. Adjust CVVHD to shoot for net negative 5-8 pounds/day. Will keep UF at -100/hr for now. D/w Renal personally.  - Suspect she has component of PAH. Consider RHC on Friday or early next week - Not candidate for coronary angio with ARF - Continue hydralazine/imdur. Do not drop BP - No b-b, ACE/ARB or spiro with ARF - PVCs may be contributing to CM. Increase amio to 200 bid   2. Paroxysmal A fib RVR - On Admit. Placed on amio drip + heparin drip.  - Chemically converted to NSR  - Maintaining NSR on po amio. Continue heparin  3. HTN  - Not on Bidil due to lack of insurance -> use hydralazine/imdur  - Stable.   4. AKI  - Creatinine on admit 1.76.  Creatinine worsened with diuresis. Marked volume overload. Started on CVVHD on 11/9.  - Renal following. See discussion above - Urology saw--. Bilateral staghorn calculi. No hydronephrosis so no role for percutaneous nephrostomy tubes.    5. Uncontrolled DM -Hgb A1C 11.7  - TRH managing On SS.   6. Uterine Mass Likely fibroid per GYN. Needs outpatient follow up.   7. Anemia  Hgb 9.5. No obvious source of bleeding.   CRITICAL CARE Performed by: [name]  Total critical care time: 35 minutes  Critical care time was exclusive of separately billable procedures and treating other patients.  Critical care was necessary to treat or prevent imminent or life-threatening deterioration.  Critical care was time spent personally by me (independent of midlevel providers or residents) on the  following activities: development of treatment plan with patient and/or surrogate as well as nursing, discussions with consultants, evaluation of patient's response to treatment, examination of patient, obtaining history from patient or surrogate, ordering and performing treatments and interventions, ordering and review of laboratory studies, ordering and review of radiographic studies, pulse oximetry and re-evaluation of patient's condition.    Length of Stay: [place], MD  10/31/2019, 10:16 AM  Advanced Heart Failure Team Pager [phone] (M-F; 7a - 4p)  Please contact [facility] for night-coverage after hours (4p -7a ) and weekends on [email or web address]

## 2019-10-31 NOTE — Progress Notes (Signed)
[facility] for IV heparin Indication: atrial fibrillation  No Known Allergies  Patient Measurements: Height: 5\' 8"  (172.7 cm) Weight: (!) 328 lb 11.3 oz (149.1 kg) IBW/kg (Calculated) : 63.9 Heparin Dosing Weight: 100.6 kg  Vital Signs: Temp: 97.6 F (36.4 C) (11/11 1100) Temp Source: Oral (11/11 1100) BP: 147/80 (11/11 1400) Pulse Rate: 81 (11/11 1400)  Labs: Recent Labs    10/30/19 0418 10/30/19 1516 10/31/19 0512 10/31/19 0816 10/31/19 0926  HGB 9.9*  --  9.3* 9.5*  --   HCT 32.2*  --  29.9* 30.0*  --   PLT 219  --  94* 95*  --   APTT 88*  --  124*  --  86*  HEPARINUNFRC 0.34  --  0.19* 0.22*  --   CREATININE 2.87* 2.49* 1.91*  --   --     Estimated Creatinine Clearance: 53.9 mL/min (A) (by C-G formula based on SCr of 1.91 mg/dL (H)).   Assessment: 29 yoF with PMH DM2, HTN, presents with R hip pain after falling. Also reports several months of LE edema, orthopnea, and abdominal distention. Found to have elevated BNP and to also be in AFib with RVR. Pharmacy to dose heparin  Heparin therapeutic this morning at 0.34, CBC stable.   .  prismasol BGK 4/2.5 400 mL/hr at 10/31/19 1234  .  prismasol BGK 4/2.5 300 mL/hr at 10/31/19 0743  . ferumoxytol 510 mg (10/26/19 1328)  . furosemide    . heparin 1,900 Units/hr (10/31/19 1400)  . prismasol BGK 4/2.5 1,500 mL/hr at 10/31/19 1134    Goal of Therapy: Heparin level 0.3-0.7 units/ml Monitor platelets by anticoagulation protocol: Yes  Plan: Increase IV heparin to 2000 units/hr. Repeat heparin level in 8 hrs Daily heparin level and CBC.  Marguerite Olea, [facility] Clinical Pharmacist Phone [phone]  10/31/2019 2:57 PM

## 2019-10-31 NOTE — Progress Notes (Signed)
ANTICOAGULATION CONSULT NOTE - Follow Up Consult  Pharmacy Consult for heparin Indication: atrial fibrillation  Labs: Recent Labs    10/30/19 0418 10/30/19 1516 10/31/19 0512 10/31/19 0816 10/31/19 0926 10/31/19 1524 10/31/19 2238  HGB 9.9*  --  9.3* 9.5*  --   --   --   HCT 32.2*  --  29.9* 30.0*  --   --   --   PLT 219  --  94* 95*  --   --   --   APTT 88*  --  124*  --  86*  --   --   HEPARINUNFRC 0.34  --  0.19* 0.22*  --   --  0.36  CREATININE 2.87* 2.49* 1.91*  --   --  1.75*  --     Assessment/Plan: 53yo female therapeutic on heparin after rate changes. Will continue gtt at current rate and confirm stable with additional level.     Wynona Neat, PharmD, BCPS  10/31/2019,11:37 PM

## 2019-10-31 NOTE — Progress Notes (Signed)
[facility] Progress Note  Subjective: [age] UOP yest and 973 UF, wt's down 149kg, pt feeling a little better  Vitals:   10/31/19 0800 10/31/19 0900 10/31/19 1010 10/31/19 1100  BP: (!) 127/94 (!) 131/92 (!) 145/97 134/73  Pulse: 86 78 88 76  Resp: (!) 21 20 18 15   Temp:      TempSrc:      SpO2: 96% 95% 99% 100%  Weight:      Height:        Inpatient medications: . amiodarone  200 mg Oral BID  . Chlorhexidine Gluconate Cloth  6 each Topical Daily  . hydrALAZINE  10 mg Oral Q6H  . insulin aspart  0-5 Units Subcutaneous QHS  . insulin aspart  0-9 Units Subcutaneous TID WC  . insulin glargine  12 Units Subcutaneous Daily  . [START ON 11/01/2019] isosorbide mononitrate  30 mg Oral Daily  . sodium chloride flush  10-40 mL Intracatheter Q12H   .  prismasol BGK 4/2.5 400 mL (10/30/19 2057)  .  prismasol BGK 4/2.5 300 mL/hr at 10/31/19 0743  . ferumoxytol 510 mg (10/26/19 1328)  . furosemide 160 mg (10/31/19 1137)  . heparin 1,900 Units/hr (10/31/19 1100)  . prismasol BGK 4/2.5 1,500 mL/hr at 10/31/19 1134   acetaminophen **OR** acetaminophen, alteplase, docusate sodium, Gerhardt's butt cream, heparin, HYDROcodone-acetaminophen, ondansetron (ZOFRAN) IV, sodium chloride, sodium chloride flush    Exam: Gen: morbidly obese AAF in NAD, on CRRT CVS: no rub Resp: decreased BS Abd: obese, +BS, soft, NT Ext: 3 + doughy edema LE's bilat to hips    CXR today - clear  Renal US - 11 cm kidneys, large bilateral renal staghorn calculi, better visualized on CT. No definite hydronephrosis, although poorly evaluated. Bladder decompressed by indwelling Foley catheter.   CT renal stone 11/3 > The kidneys demonstrate extensive bilateral staghorn calculi. No visible hydronephrosis. Difficult to follow the course of either ureter given lack of contrast and fluid in the abdomen. No contour deforming renal lesions are seen. There is circumferential bladder wall thickening.   ECHO  10/21/19 >  Left Ventricle: Left ventricular ejection fraction, by visual estimation, is 20 to 25%. The left ventricle has severely decreased function. The left ventricle demonstrates global hypokinesis. There is mildly increased left ventricular hypertrophy. Left ventricular diastolic parameters are indeterminate. Normal left atrial pressure.   UA 11/3 > > 50 rbc/ wbc per hpf, prot 100   Urine prot-creat ratio = 1.0   UNa 87, UCr 39 on 11/7 (on lasix)   Assessment/Plan: 1. AKI/CKD- in setting of large, bilateral staghorn calculi, anasarca, atrial fibrillation with RVR, and new low EF 20--25%. Baseline creat prob around 1.7- 1.9. Her lasix was held due to rising Scr and we were consulted to help assist with diuresis and lasix was restarted at higher dose w/ IV albumin, however patient did not improve w/ high dosing IV diuretics and so was started on CRRT on 11/9. Serologic w/u essentially negative except mildly bilat ANCA (both)      - goal total wt lowering 5- 8 lbs per day, have d/w cards - UOP yest 4L, will lower lasix 160 to tid - cont UF net neg 100 cc/hr approx 2. HTN - on hydral/ imdur, BP's good 3. New onset, acute systolic CHF EF 69-48%, per cards 4. Atrial fibrillation- rate controlled with amiodarone. Currently on heparin drip 5. Bilateral staghorn calculi- evaluated by Urology. No evidence of hydronephrosis so no indication for nephrostomy tubes 6. DM-poorly controlled  with Hgb A1c 11.7%.per primary 7. Right hip pain s/p mechanical fall- no fx 8. Anemia- normocytic.Low iron will replete and follow 9. Uterine mass- is a large fibroid no change from CT in 2019  Kelly Splinter, MD 10/27/2019, 10:09 PM  Iron/TIBC/Ferritin/ %Sat    Component Value Date/Time   IRON 44 10/21/2019 0703   TIBC 355 10/21/2019 0703   FERRITIN 20 10/21/2019 0703   IRONPCTSAT 12 10/21/2019 0703   Recent Labs  Lab 10/31/19 0512  NA 137  K 3.5  CL 99  CO2 26  GLUCOSE 154*  BUN 29*   CREATININE 1.91*  CALCIUM 9.4  PHOS 2.8  ALBUMIN 3.9   No results for input(s): AST, ALT, ALKPHOS, BILITOT, PROT in the last 168 hours. Recent Labs  Lab 10/31/19 0816  WBC 5.5  HGB 9.5*  HCT 30.0*  PLT 95*

## 2019-11-01 LAB — RENAL FUNCTION PANEL
Albumin: 3.7 g/dL (ref 3.5–5.0)
Albumin: 3.7 g/dL (ref 3.5–5.0)
Anion gap: 11 (ref 5–15)
Anion gap: 10 (ref 5–15)
BUN: 19 mg/dL (ref 6–20)
BUN: 16 mg/dL (ref 6–20)
CO2: 28 mmol/L (ref 22–32)
CO2: 28 mmol/L (ref 22–32)
Calcium: 9.2 mg/dL (ref 8.9–10.3)
Calcium: 9.3 mg/dL (ref 8.9–10.3)
Chloride: 100 mmol/L (ref 98–111)
Chloride: 99 mmol/L (ref 98–111)
Creatinine, Ser: 1.49 mg/dL — ABNORMAL HIGH (ref 0.44–1.00)
Creatinine, Ser: 1.46 mg/dL — ABNORMAL HIGH (ref 0.44–1.00)
GFR calc Af Amer: 47 mL/min — ABNORMAL LOW (ref >60)
GFR calc Af Amer: 48 mL/min — ABNORMAL LOW (ref >60)
GFR calc non Af Amer: 40 mL/min — ABNORMAL LOW (ref >60)
GFR calc non Af Amer: 41 mL/min — ABNORMAL LOW (ref >60)
Glucose, Bld: 188 mg/dL — ABNORMAL HIGH (ref 70–99)
Glucose, Bld: 151 mg/dL — ABNORMAL HIGH (ref 70–99)
Phosphorus: 2.2 mg/dL — ABNORMAL LOW (ref 2.5–4.6)
Phosphorus: 1.9 mg/dL — ABNORMAL LOW (ref 2.5–4.6)
Potassium: 3.8 mmol/L (ref 3.5–5.1)
Potassium: 3.8 mmol/L (ref 3.5–5.1)
Sodium: 139 mmol/L (ref 135–145)
Sodium: 137 mmol/L (ref 135–145)

## 2019-11-01 LAB — CBC
HCT: 29.7 % — ABNORMAL LOW (ref 36.0–46.0)
Hemoglobin: 9.2 g/dL — ABNORMAL LOW (ref 12.0–15.0)
MCH: 24.7 pg — ABNORMAL LOW (ref 26.0–34.0)
MCHC: 31 g/dL (ref 30.0–36.0)
MCV: 79.8 fL — ABNORMAL LOW (ref 80.0–100.0)
Platelets: 82 10*3/uL — ABNORMAL LOW (ref 150–400)
RBC: 3.72 MIL/uL — ABNORMAL LOW (ref 3.87–5.11)
RDW: 20.2 % — ABNORMAL HIGH (ref 11.5–15.5)
WBC: 5.5 10*3/uL (ref 4.0–10.5)
nRBC: 0 % (ref 0.0–0.2)

## 2019-11-01 LAB — GLUCOSE, CAPILLARY
Glucose-Capillary: 215 mg/dL — ABNORMAL HIGH (ref 70–99)
Glucose-Capillary: 145 mg/dL — ABNORMAL HIGH (ref 70–99)
Glucose-Capillary: 144 mg/dL — ABNORMAL HIGH (ref 70–99)
Glucose-Capillary: 203 mg/dL — ABNORMAL HIGH (ref 70–99)

## 2019-11-01 LAB — HEPARIN LEVEL (UNFRACTIONATED): Heparin Unfractionated: 0.36 IU/mL (ref 0.30–0.70)

## 2019-11-01 LAB — HEPARIN INDUCED PLATELET AB (HIT ANTIBODY): Heparin Induced Plt Ab: 0.983 OD — ABNORMAL HIGH (ref 0.000–0.400)

## 2019-11-01 LAB — APTT: aPTT: 145 seconds — ABNORMAL HIGH (ref 24–36)

## 2019-11-01 LAB — MAGNESIUM: Magnesium: 2.1 mg/dL (ref 1.7–2.4)

## 2019-11-01 MED ORDER — HYDRALAZINE HCL 25 MG PO TABS
25.0000 mg | ORAL_TABLET | Freq: Three times a day (TID) | ORAL | Status: DC
  Administered 2019-11-01 – 2019-11-02 (×4): 25 mg via ORAL
  Filled 2019-11-01 (×4): qty 1

## 2019-11-01 MED ORDER — SODIUM CHLORIDE 0.9 % IV SOLN
0.0500 mg/kg/h | INTRAVENOUS | Status: DC
  Administered 2019-11-01 – 2019-11-09 (×6): 0.05 mg/kg/h via INTRAVENOUS
  Filled 2019-11-01 (×8): qty 250

## 2019-11-01 NOTE — Progress Notes (Addendum)
Advanced Heart Failure Rounding Note  PCP-Cardiologist: Donato Heinz, MD   Subjective:    Transferred from [facility] to [facility] for HF management. Marked volume overload. Started on CVVHD.  Remains on CVVHD and also high-dose lasix. Weight down another 14 pounds  341---> 309 -> 295 pounds.  Urine output about 6L CVVHD -100  Continues to feel better. Denies SOB, orthopnea or PND. HIT panel + SRA pending. SBP 140. Now on bival. No bleeding   Objective:   Weight Range: 134.2 kg Body mass index is 44.98 kg/m.   Vital Signs:   Temp:  [98.1 F (36.7 C)-98.9 F (37.2 C)] 98.5 F (36.9 C) (11/13 0805) Pulse Rate:  [48-103] 52 (11/13 0805) Resp:  [16-29] 17 (11/13 0805) BP: (123-187)/(66-164) 147/71 (11/13 0805) SpO2:  [95 %-100 %] 95 % (11/13 0805) Weight:  [134.2 kg] 134.2 kg (11/13 0406) Last BM Date: 11/01/19  Weight change: Filed Weights   11/01/19 0354 11/01/19 0800 11/02/19 0406  Weight: (!) 149 kg (!) 140.4 kg 134.2 kg    Intake/Output:   Intake/Output Summary (Last 24 hours) at 11/02/2019 0922 Last data filed at 11/02/2019 0800 Gross per 24 hour  Intake 832.39 ml  Output 7914 ml  Net -7081.61 ml      Physical Exam  CVP 18 General:  Sitting up in bed  HEENT: normal Neck: supple. + JVP to jaw RIJ trialysis Carotids 2+ bilat; no bruits. No lymphadenopathy or thryomegaly appreciated. Cor: PMI nondisplaced. Regular rate & rhythm. No rubs, gallops or murmurs. Lungs: clear Abdomen: obese soft, nontender, nondistended. No hepatosplenomegaly. No bruits or masses. Good bowel sounds. Extremities: no cyanosis, clubbing, rash, 2+ edema Neuro: alert & orientedx3, cranial nerves grossly intact. moves all 4 extremities w/o difficulty. Affect pleasa  Telemetry   SR 90-100 PVC burden decreased  personally checked.  Labs    CBC Recent Labs    11/01/19 0353 11/02/19 0338  WBC 5.5 4.9  HGB 9.2* 9.1*  HCT 29.7* 29.4*  MCV 79.8* 81.4  PLT 82* 68*   Basic  Metabolic Panel Recent Labs    11/01/19 0353 11/01/19 1601 11/02/19 0338  NA 139 137 139  K 3.8 3.8 3.6  CL 100 99 100  CO2 28 28 28   GLUCOSE 188* 151* 171*  BUN 19 16 13   CREATININE 1.49* 1.46* 1.32*  CALCIUM 9.2 9.3 9.1  MG 2.1  --  2.2  PHOS 2.2* 1.9* 2.1*   Liver Function Tests Recent Labs    11/01/19 1601 11/02/19 0338  ALBUMIN 3.7 3.4*   No results for input(s): LIPASE, AMYLASE in the last 72 hours. Cardiac Enzymes No results for input(s): CKTOTAL, CKMB, CKMBINDEX, TROPONINI in the last 72 hours.  BNP: BNP (last 3 results) Recent Labs    10/20/19 2029  BNP 1,623.1*    ProBNP (last 3 results) No results for input(s): PROBNP in the last 8760 hours.   D-Dimer No results for input(s): DDIMER in the last 72 hours. Hemoglobin A1C No results for input(s): HGBA1C in the last 72 hours. Fasting Lipid Panel No results for input(s): CHOL, HDL, LDLCALC, TRIG, CHOLHDL, LDLDIRECT in the last 72 hours. Thyroid Function Tests No results for input(s): TSH, T4TOTAL, T3FREE, THYROIDAB in the last 72 hours.  Invalid input(s): FREET3  Other results:   Imaging    No results found.   Medications:     Scheduled Medications: . amiodarone  200 mg Oral BID  . Chlorhexidine Gluconate Cloth  6 each Topical Daily  .  hydrALAZINE  25 mg Oral Q8H  . insulin aspart  0-5 Units Subcutaneous QHS  . insulin aspart  0-9 Units Subcutaneous TID WC  . insulin glargine  12 Units Subcutaneous Daily  . isosorbide mononitrate  30 mg Oral Daily  . sodium chloride flush  10-40 mL Intracatheter Q12H    Infusions: .  prismasol BGK 4/2.5 400 mL/hr at 11/01/19 1519  .  prismasol BGK 4/2.5 200 mL/hr at 11/01/19 0904  . bivalirudin (ANGIOMAX) infusion 0.5 mg/mL (Non-ACS indications) 0.05 mg/kg/hr (11/02/19 0800)  . ferumoxytol 510 mg (10/26/19 1328)  . furosemide Stopped (11/02/19 0754)  . prismasol BGK 4/2.5 1,500 mL/hr at 11/01/19 2210    PRN Medications: acetaminophen **OR**  acetaminophen, alteplase, docusate sodium, Gerhardt's butt cream, HYDROcodone-acetaminophen, ondansetron (ZOFRAN) IV, sodium chloride, sodium chloride flush     Assessment/Plan   1. Acute Systolic Heart Failure - BNP >1600 on admit. ECHO this admit  EF 20-25%, pericardial effusion, mild LVH, RA/LA dilated.  RV down . Possible Tachy Mediated cardiomyopathy versus HTN. . - Started CVVHD on 11/9 due to poor response to IV diuresis and rising creatinine. CVP initially 32 now 18-19  - Weight down 46 pounds so far but CVP still up and urine output brisk. (Patient reports dry weight of 250 pounds) - Urien output picked up markedly with fluid removal and renal vein decomaression. Can stop CVVHD and continue diuresis. D/w Renal at bedside - Plan RHC on Monday after full fluid removal - Not candidate for coronary angio with ARF - Increase hydralazine to 37.5 mg three times a day. Continue imdur 30 mg daily.  - No b-b, ACE/ARB or spiro with ARF - PVCs may be contributing to CM. Continue amio 200 bid - Consult CR  2. Paroxysmal A fib RVR - On Admit. Placed on amio drip + heparin drip.  - Chemically converted to NSR  - Maintaining NSR on po amio. Heparin off due to + HIT screen. On bival  3. HTN  - SBP 140s. Not on Bidil due to lack of insurance -> use hydralazine/imdur  - Would not drop SBP < 120 to preserve renal perfusion  - See above.   4. AKI  - Creatinine on admit 1.76.  Creatinine worsened with diuresis. Marked volume overload. Started on CVVHD on 11/9.  - Renal following. See discussion above - Urology saw--. Bilateral staghorn calculi. No hydronephrosis so no role for percutaneous nephrostomy tubes.    5. Uncontrolled DM - Hgb A1C 11.7  - TRH managing On SS.   6. Uterine Mass - Likely fibroid per GYN. Needs outpatient follow up.   7. Anemia  - Hgb stable at 9.1  8. Thrombocytopenia - Platelets dropping. 219> 28>41>32>44 - HIT panel +. SRA pending. Now on Bival  - No  bleeding  CRITICAL CARE Performed by: [name]  Total critical care time: 35 minutes  Critical care time was exclusive of separately billable procedures and treating other patients.  Critical care was necessary to treat or prevent imminent or life-threatening deterioration.  Critical care was time spent personally by me (independent of midlevel providers or residents) on the following activities: development of treatment plan with patient and/or surrogate as well as nursing, discussions with consultants, evaluation of patient's response to treatment, examination of patient, obtaining history from patient or surrogate, ordering and performing treatments and interventions, ordering and review of laboratory studies, ordering and review of radiographic studies, pulse oximetry and re-evaluation of patient's condition.     Length of Stay:  [place], MD  11/02/2019, 9:22 AM  Advanced Heart Failure Team Pager [phone] (M-F; 7a - 4p)  Please contact [facility] for night-coverage after hours (4p -7a ) and weekends on [email or web address]

## 2019-11-01 NOTE — Progress Notes (Signed)
Physical Therapy Treatment Patient Details Name: Adrienne Lane MRN: [id number] DOB: 08/31/1966 Today's Date: 11/01/2019    History of Present Illness 53 yo female admitted to ED on 10/29 with fall out of minivan and R hip and LE pain. Xray negative for fracture. Pt found to be in afib with RVR, sHF (new diagnosis). PMH includes DM with A1C 11.7, HTN. Pt started on CRRT on 11/9    PT Comments    Pt fatigued during session, yawning at times, but tolerates therapeutic exercise with use of Blue theraband well. Pt demonstrates good LE and UE strength, limited at times by CRRT lines and pain from IV in LUE. Pt will continue to benefit from PT POC to ensure a return to her prior level of function.   Follow Up Recommendations  Home health PT;Supervision/Assistance - 24 hour     Equipment Recommendations  None recommended by PT    Recommendations for Other Services       Precautions / Restrictions Precautions Precautions: Fall Restrictions Weight Bearing Restrictions: No    Mobility  Bed Mobility                  Transfers Overall transfer level: (pt preferring to transfer to [facility] with assistance of nursing)                  Ambulation/Gait                 Stairs             Wheelchair Mobility    Modified Rankin (Stroke Patients Only)       Balance Overall balance assessment: Modified Independent Sitting-balance support: Feet unsupported;Bilateral upper extremity supported Sitting balance-Leahy Scale: Good                                      Cognition Arousal/Alertness: Awake/alert Behavior During Therapy: WFL for tasks assessed/performed Overall Cognitive Status: Within Functional Limits for tasks assessed                                        Exercises General Exercises - Upper Extremity Elbow Flexion: AROM;Both;Other reps (comment);Theraband(8 reps, blue theraband) Elbow Extension: AROM;Both;Other  reps (comment);Other (comment);Theraband(8 reps, blue theraband) General Exercises - Lower Extremity Ankle Circles/Pumps: AROM;Both;Other reps (comment);Other (comment)(12 reps blue theraband) Long Arc Quad: AROM;Both;Other reps (comment)(blue theraband, 12 reps) Hip ABduction/ADduction: AROM;Both;10 reps Hip Flexion/Marching: AROM;Both;5 reps    General Comments        Pertinent Vitals/Pain Pain Assessment: No/denies pain    Home Living                      Prior Function            PT Goals (current goals can now be found in the care plan section) Acute Rehab PT Goals Patient Stated Goal: return home with family Progress towards PT goals: Not progressing toward goals - comment(limited due to CRRT, pt and RN report pt ambulates well)    Frequency    Min 3X/week      PT Plan Current plan remains appropriate    Co-evaluation              AM-PAC PT "6 Clicks" Mobility   Outcome Measure  Help needed turning from your back  to your side while in a flat bed without using bedrails?: A Lot Help needed moving from lying on your back to sitting on the side of a flat bed without using bedrails?: A Lot Help needed moving to and from a bed to a chair (including a wheelchair)?: A Lot Help needed standing up from a chair using your arms (e.g., wheelchair or bedside chair)?: A Little Help needed to walk in hospital room?: A Little Help needed climbing 3-5 steps with a railing? : A Lot 6 Click Score: 14    End of Session Equipment Utilized During Treatment: (none) Activity Tolerance: Patient tolerated treatment well Patient left: in chair;with call bell/phone within reach;with nursing/sitter in room Nurse Communication: Mobility status PT Visit Diagnosis: Other abnormalities of gait and mobility (R26.89);History of falling (Z91.81);Unsteadiness on feet (R26.81)     Time: 1245-1300 PT Time Calculation (min) (ACUTE ONLY): 15 min  Charges:  $Therapeutic  Exercise: 8-22 mins                     Zenaida Niece, PT, DPT Acute Rehabilitation Pager: [phone]    Zenaida Niece 11/01/2019, 1:20 PM

## 2019-11-01 NOTE — Progress Notes (Signed)
ANTICOAGULATION CONSULT NOTE  Pharmacy Consult:  Bivalirudin Indication: atrial fibrillation with +HIT Ab  No Known Allergies  Patient Measurements: Height: 5\' 8"  (172.7 cm) Weight: (!) 309 lb 8.4 oz (140.4 kg) IBW/kg (Calculated) : 63.9  Vital Signs: Temp: 98.2 F (36.8 C) (11/12 1900) Temp Source: Oral (11/12 1900) BP: 170/90 (11/12 1900) Pulse Rate: 99 (11/12 1900)  Labs: Recent Labs    10/31/19 0512 10/31/19 0816 10/31/19 0926 10/31/19 1524 10/31/19 2238 11/01/19 0353 11/01/19 1601  HGB 9.3* 9.5*  --   --   --  9.2*  --   HCT 29.9* 30.0*  --   --   --  29.7*  --   PLT 94* 95*  --   --   --  82*  --   APTT 124*  --  86*  --   --  145*  --   HEPARINUNFRC 0.19* 0.22*  --   --  0.36 0.36  --   CREATININE 1.91*  --   --  1.75*  --  1.49* 1.46*    Estimated Creatinine Clearance: 68 mL/min (A) (by C-G formula based on SCr of 1.46 mg/dL (H)).   Assessment: 45 yoF with PMH DM2, HTN, presents with R hip pain after falling. Also reports several months of LE edema, orthopnea, and abdominal distention. Found to have elevated BNP and to also be in AFib with RVR.  Patient has been on IV heparin, now with positive HIT antibody.  Pharmacy has been consulted to transition from IV heparin to bivalirudin.  Renal aware all heparin through CRRT will be discontinued.  Goal of Therapy: APTT 50-85 sec Monitor platelets by anticoagulation protocol: Yes  Plan: Start bivalirudin at 0.05 mg/kg/hr Check 2 hr aPTT and SRA Daily aPTT and CBC  Schon Zeiders D. Mina Marble, PharmD, BCPS, [place] 11/01/2019, 7:50 PM

## 2019-11-01 NOTE — Progress Notes (Signed)
[facility] Progress Note  Subjective: 5400 UOP and 2000 UF yest, net neg 6.2 L.   Feeling better.   Vitals:   11/01/19 0900 11/01/19 1000 11/01/19 1100 11/01/19 1200  BP: (!) 145/76 138/74 133/81 (!) 151/93  Pulse: 87 91 92 88  Resp: (!) 23 18    Temp:   98.3 F (36.8 C)   TempSrc:   Oral   SpO2: 99% 100% 100% 100%  Weight:      Height:        Inpatient medications: . amiodarone  200 mg Oral BID  . Chlorhexidine Gluconate Cloth  6 each Topical Daily  . hydrALAZINE  25 mg Oral Q8H  . insulin aspart  0-5 Units Subcutaneous QHS  . insulin aspart  0-9 Units Subcutaneous TID WC  . insulin glargine  12 Units Subcutaneous Daily  . isosorbide mononitrate  30 mg Oral Daily  . sodium chloride flush  10-40 mL Intracatheter Q12H   .  prismasol BGK 4/2.5 400 mL/hr at 11/01/19 0201  .  prismasol BGK 4/2.5 200 mL/hr at 11/01/19 0904  . ferumoxytol 510 mg (10/26/19 1328)  . furosemide Stopped (11/01/19 0263)  . heparin 2,000 Units/hr (11/01/19 1200)  . prismasol BGK 4/2.5 1,500 mL/hr at 11/01/19 1156   acetaminophen **OR** acetaminophen, alteplase, docusate sodium, Gerhardt's butt cream, heparin, HYDROcodone-acetaminophen, ondansetron (ZOFRAN) IV, sodium chloride, sodium chloride flush    Exam: Gen: morbidly obese AAF in NAD, on CRRT CVS: no rub Resp: decreased BS Abd: obese, +BS, soft, NT Ext: 3 + doughy edema LE's bilat to hips    CXR today - clear  Renal US - 11 cm kidneys, large bilateral renal staghorn calculi, better visualized on CT. No definite hydronephrosis, although poorly evaluated. Bladder decompressed by indwelling Foley catheter.   CT renal stone 11/3 > The kidneys demonstrate extensive bilateral staghorn calculi. No visible hydronephrosis. Difficult to follow the course of either ureter given lack of contrast and fluid in the abdomen. No contour deforming renal lesions are seen. There is circumferential bladder wall thickening.   ECHO 10/21/19 >  Left  Ventricle: Left ventricular ejection fraction, by visual estimation, is 20 to 25%. The left ventricle has severely decreased function. The left ventricle demonstrates global hypokinesis. There is mildly increased left ventricular hypertrophy. Left ventricular diastolic parameters are indeterminate. Normal left atrial pressure.   UA 11/3 > > 50 rbc/ wbc per hpf, prot 100   Urine prot-creat ratio = 1.0   UNa 87, UCr 39 on 11/7 (on lasix)   Assessment/Plan: 1. AKI/CKD- in setting of anasarca, atrial fibrillation with RVR, and new low EF 20--25%. Suspected cardiorenal.  Baseline creat prob around 1.7- 1.9. Anasarca did not improve w/ high dose IV diuretics and so was started on CRRT on 11/9. Serologic w/u essentially negative except mild ^ of both ANCA's      - good UF and good UOP - cont IV lasix 160 to tid - cont UF net neg 100 cc/hr approx 2. HTN - on hydral/ imdur, BP's good 3. New onset, acute systolic CHF EF 78-58%, per cards 4. Atrial fibrillation- rate controlled with amiodarone. Currently on heparin drip 5. Bilateral staghorn calculi- evaluated by Urology. No evidence of hydronephrosis so no indication for nephrostomy tubes 6. DM-poorly controlled with Hgb A1c 11.7%.per primary 7. Right hip pain s/p mechanical fall- no fx 8. Anemia- normocytic.Low iron will replete and follow 9. Uterine mass- is a large fibroid no change from CT in 2019  [employer],  MD 10/27/2019, 10:09 PM  Iron/TIBC/Ferritin/ %Sat    Component Value Date/Time   IRON 44 10/21/2019 0703   TIBC 355 10/21/2019 0703   FERRITIN 20 10/21/2019 0703   IRONPCTSAT 12 10/21/2019 0703   Recent Labs  Lab 11/01/19 0353  NA 139  K 3.8  CL 100  CO2 28  GLUCOSE 188*  BUN 19  CREATININE 1.49*  CALCIUM 9.2  PHOS 2.2*  ALBUMIN 3.7   No results for input(s): AST, ALT, ALKPHOS, BILITOT, PROT in the last 168 hours. Recent Labs  Lab 11/01/19 0353  WBC 5.5  HGB 9.2*  HCT 29.7*  PLT 82*

## 2019-11-01 NOTE — Progress Notes (Signed)
[place] for IV heparin Indication: atrial fibrillation  No Known Allergies  Patient Measurements: Height: 5\' 8"  (172.7 cm) Weight: (!) 309 lb 8.4 oz (140.4 kg) IBW/kg (Calculated) : 63.9 Heparin Dosing Weight: 100.6 kg  Vital Signs: Temp: 98.1 F (36.7 C) (11/12 0700) Temp Source: Oral (11/12 0700) BP: 138/74 (11/12 1000) Pulse Rate: 91 (11/12 1000)  Labs: Recent Labs    10/31/19 0512 10/31/19 0816 10/31/19 0926 10/31/19 1524 10/31/19 2238 11/01/19 0353  HGB 9.3* 9.5*  --   --   --  9.2*  HCT 29.9* 30.0*  --   --   --  29.7*  PLT 94* 95*  --   --   --  82*  APTT 124*  --  86*  --   --  145*  HEPARINUNFRC 0.19* 0.22*  --   --  0.36 0.36  CREATININE 1.91*  --   --  1.75*  --  1.49*    Estimated Creatinine Clearance: 66.6 mL/min (A) (by C-G formula based on SCr of 1.49 mg/dL (H)).   Assessment: 89 yoF with PMH DM2, HTN, presents with R hip pain after falling. Also reports several months of LE edema, orthopnea, and abdominal distention. Found to have elevated BNP and to also be in AFib with RVR. Pharmacy to dose heparin  Heparin therapeutic this morning at 0.36, H/H stable, pltc down and HIT Ab sent yesterday.   Goal of Therapy: Heparin level 0.3-0.7 units/ml Monitor platelets by anticoagulation protocol: Yes  Plan: Continue IV heparin 2000 units/hr. Daily heparin level and CBC F/U HIT antibody   Arrie Senate, PharmD, BCPS Clinical Pharmacist [phone] Please check AMION for all [place] numbers 11/01/2019

## 2019-11-02 LAB — CBC
HCT: 29.4 % — ABNORMAL LOW (ref 36.0–46.0)
Hemoglobin: 9.1 g/dL — ABNORMAL LOW (ref 12.0–15.0)
MCH: 25.2 pg — ABNORMAL LOW (ref 26.0–34.0)
MCHC: 31 g/dL (ref 30.0–36.0)
MCV: 81.4 fL (ref 80.0–100.0)
Platelets: 68 10*3/uL — ABNORMAL LOW (ref 150–400)
RBC: 3.61 MIL/uL — ABNORMAL LOW (ref 3.87–5.11)
RDW: 20.5 % — ABNORMAL HIGH (ref 11.5–15.5)
WBC: 4.9 10*3/uL (ref 4.0–10.5)
nRBC: 0 % (ref 0.0–0.2)

## 2019-11-02 LAB — RENAL FUNCTION PANEL
Albumin: 3.4 g/dL — ABNORMAL LOW (ref 3.5–5.0)
Anion gap: 11 (ref 5–15)
BUN: 13 mg/dL (ref 6–20)
CO2: 28 mmol/L (ref 22–32)
Calcium: 9.1 mg/dL (ref 8.9–10.3)
Chloride: 100 mmol/L (ref 98–111)
Creatinine, Ser: 1.32 mg/dL — ABNORMAL HIGH (ref 0.44–1.00)
GFR calc Af Amer: 54 mL/min — ABNORMAL LOW (ref >60)
GFR calc non Af Amer: 47 mL/min — ABNORMAL LOW (ref >60)
Glucose, Bld: 171 mg/dL — ABNORMAL HIGH (ref 70–99)
Phosphorus: 2.1 mg/dL — ABNORMAL LOW (ref 2.5–4.6)
Potassium: 3.6 mmol/L (ref 3.5–5.1)
Sodium: 139 mmol/L (ref 135–145)

## 2019-11-02 LAB — GLUCOSE, CAPILLARY
Glucose-Capillary: 153 mg/dL — ABNORMAL HIGH (ref 70–99)
Glucose-Capillary: 191 mg/dL — ABNORMAL HIGH (ref 70–99)
Glucose-Capillary: 202 mg/dL — ABNORMAL HIGH (ref 70–99)
Glucose-Capillary: 255 mg/dL — ABNORMAL HIGH (ref 70–99)
Glucose-Capillary: 221 mg/dL — ABNORMAL HIGH (ref 70–99)

## 2019-11-02 LAB — BASIC METABOLIC PANEL
Anion gap: 9 (ref 5–15)
BUN: 12 mg/dL (ref 6–20)
CO2: 27 mmol/L (ref 22–32)
Calcium: 9 mg/dL (ref 8.9–10.3)
Chloride: 100 mmol/L (ref 98–111)
Creatinine, Ser: 1.69 mg/dL — ABNORMAL HIGH (ref 0.44–1.00)
GFR calc Af Amer: 40 mL/min — ABNORMAL LOW (ref >60)
GFR calc non Af Amer: 35 mL/min — ABNORMAL LOW (ref >60)
Glucose, Bld: 230 mg/dL — ABNORMAL HIGH (ref 70–99)
Potassium: 4.1 mmol/L (ref 3.5–5.1)
Sodium: 136 mmol/L (ref 135–145)

## 2019-11-02 LAB — APTT
aPTT: 58 seconds — ABNORMAL HIGH (ref 24–36)
aPTT: 73 seconds — ABNORMAL HIGH (ref 24–36)
aPTT: 68 seconds — ABNORMAL HIGH (ref 24–36)

## 2019-11-02 LAB — MAGNESIUM: Magnesium: 2.2 mg/dL (ref 1.7–2.4)

## 2019-11-02 MED ORDER — HYDRALAZINE HCL 25 MG PO TABS
37.5000 mg | ORAL_TABLET | Freq: Three times a day (TID) | ORAL | Status: DC
  Administered 2019-11-02 – 2019-11-03 (×3): 37.5 mg via ORAL
  Filled 2019-11-02 (×3): qty 2

## 2019-11-02 MED ORDER — POTASSIUM CHLORIDE CRYS ER 20 MEQ PO TBCR
40.0000 meq | EXTENDED_RELEASE_TABLET | Freq: Once | ORAL | Status: AC
  Administered 2019-11-02: 10:00:00 40 meq via ORAL
  Filled 2019-11-02: qty 2

## 2019-11-02 NOTE — Progress Notes (Signed)
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 Heparin     +HIT Ab 11/01/19    Patient Measurements: Height: 5\' 8"  (172.7 cm) Weight: (!) 309 lb 8.4 oz (140.4 kg) IBW/kg (Calculated) : 63.9  Vital Signs: Temp: 98.2 F (36.8 C) (11/12 1900) Temp Source: Oral (11/12 1900) BP: 158/84 (11/12 2300) Pulse Rate: 93 (11/12 2300)  Labs: Recent Labs    10/31/19 0512 10/31/19 0816 10/31/19 0926 10/31/19 1524 10/31/19 2238 11/01/19 0353 11/01/19 1601 11/01/19 2326  HGB 9.3* 9.5*  --   --   --  9.2*  --   --   HCT 29.9* 30.0*  --   --   --  29.7*  --   --   PLT 94* 95*  --   --   --  82*  --   --   APTT 124*  --  86*  --   --  145*  --  58*  HEPARINUNFRC 0.19* 0.22*  --   --  0.36 0.36  --   --   CREATININE 1.91*  --   --  1.75*  --  1.49* 1.46*  --     Estimated Creatinine Clearance: 68 mL/min (A) (by C-G formula based on SCr of 1.46 mg/dL (H)).   Assessment: 73 yoF with PMH DM2, HTN, presents with R hip pain after falling. Also reports several months of LE edema, orthopnea, and abdominal distention. Found to have elevated BNP and to also be in AFib with RVR.  Patient has been on IV heparin, now with positive HIT antibody.  Pharmacy has been consulted to transition from IV heparin to bivalirudin.  Renal aware all heparin through CRRT will be discontinued.  Initial aPTT therapeutic at 58 sec.  Goal of Therapy: APTT 50-85 sec Monitor platelets by anticoagulation protocol: Yes  Plan: Continue bivalirudin at 0.05 mg/kg/hr Check aPTT with am labs Daily aPTT and CBC F/u SRA  Alanda Slim, PharmD, [facility] Clinical Pharmacist Please see AMION for all Pharmacists' Contact Phone Numbers 11/02/2019, 12:44 AM

## 2019-11-02 NOTE — Progress Notes (Signed)
[facility] Progress Note  Subjective:   Ongoing excellent UOP, 6L UOP, 2.2L UF, neg 7.7L yesterday  K 3.6 and P 2.2.   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 amiodarone  200 mg Oral BID  . Chlorhexidine Gluconate Cloth  6 each Topical Daily  . hydrALAZINE  37.5 mg Oral Q8H  . insulin aspart  0-5 Units Subcutaneous QHS  . insulin aspart  0-9 Units Subcutaneous TID WC  . insulin glargine  12 Units Subcutaneous Daily  . isosorbide mononitrate  30 mg Oral Daily  . sodium chloride flush  10-40 mL Intracatheter Q12H   .  prismasol BGK 4/2.5 400 mL/hr at 11/01/19 1519  .  prismasol BGK 4/2.5 200 mL/hr at 11/01/19 0904  . bivalirudin (ANGIOMAX) infusion 0.5 mg/mL (Non-ACS indications) 0.05 mg/kg/hr (11/02/19 1200)  . ferumoxytol 510 mg (10/26/19 1328)  . furosemide Stopped (11/02/19 0754)  . prismasol BGK 4/2.5 1,500 mL/hr at 11/01/19 2210   acetaminophen **OR** acetaminophen, alteplase, docusate sodium, Gerhardt's butt cream, HYDROcodone-acetaminophen, ondansetron (ZOFRAN) IV, sodium chloride, sodium chloride flush    Exam: Gen: morbidly obese AAF in NAD, on CRRT CVS: no rub Resp: decreased BS Abd: obese, +BS, soft, NT Ext: 3 + doughy edema LE's bilat to hips    CXR today - clear  Renal US - 11 cm kidneys, large bilateral renal staghorn calculi, better visualized on CT. No definite hydronephrosis, although poorly evaluated. Bladder decompressed by indwelling Foley catheter.   CT renal stone 11/3 > The kidneys demonstrate extensive bilateral staghorn calculi. No visible hydronephrosis. Difficult to follow the course of either ureter given lack of  contrast and fluid in the abdomen. No contour deforming renal lesions are seen. There is circumferential bladder wall thickening.   ECHO 10/21/19 >  Left Ventricle: Left ventricular ejection fraction, by visual estimation, is 20 to 25%. The left ventricle has severely decreased function. The left ventricle demonstrates global hypokinesis. There is mildly increased left ventricular hypertrophy. Left ventricular diastolic parameters are indeterminate. Normal left atrial pressure.   UA 11/3 > > 50 rbc/ wbc per hpf, prot 100   Urine prot-creat ratio = 1.0   UNa 87, UCr 39 on 11/7 (on lasix)   Assessment/Plan: 1. AKI/CKD- in setting of anasarca, atrial fibrillation with RVR, and new low EF 20--25%. Suspected cardiorenal.  Baseline creat prob around 1.7- 1.9. Anasarca did not improve w/ high dose IV diuretics and so was started on CRRT on 11/9. Serologic w/u essentially negative except mild ^ of both ANCA's.  Now clearly recovering GFR and diuretics responsive. Stop CRRT today, cont diuretics.  BMP BID 2. HTN - on hydral/ imdur, BP's good 3. New onset, acute systolic CHF EF 95-63%, per AHF 4. Atrial fibrillation- rate controlled with amiodarone.  ? HIT, bivalirudin 5. Bilateral staghorn calculi- evaluated by Urology. No evidence of hydronephrosis so no indication for nephrostomy tubes 6. DM-poorly controlled with Hgb A1c 11.7%.per primary 7. Right hip pain s/p mechanical fall- no fx 8. Anemia- normocytic.Low iron will replete and  follow 9. Uterine mass- is a large fibroid no change from CT in 2019  Rexene Agent MD 10/27/2019, 10:09 PM  Iron/TIBC/Ferritin/ %Sat    Component Value Date/Time   IRON 44 10/21/2019 0703   TIBC 355 10/21/2019 0703   FERRITIN 20 10/21/2019 0703   IRONPCTSAT 12 10/21/2019 0703   Recent Labs  Lab 11/02/19 0338  NA 139  K 3.6  CL 100  CO2 28  GLUCOSE 171*  BUN 13  CREATININE 1.32*  CALCIUM 9.1  PHOS 2.1*  ALBUMIN 3.4*   No results for input(s): AST,  ALT, ALKPHOS, BILITOT, PROT in the last 168 hours. Recent Labs  Lab 11/02/19 0338  WBC 4.9  HGB 9.1*  HCT 29.4*  PLT 68*

## 2019-11-02 NOTE — Progress Notes (Signed)
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 Heparin     +HIT Ab 11/01/19    Patient Measurements: Height: 5\' 8"  (172.7 cm) Weight: 295 lb 13.7 oz (134.2 kg)(standing scale) IBW/kg (Calculated) : 63.9  Vital Signs: Temp: 98.9 F (37.2 C) (11/13 0000) Temp Source: Oral (11/13 0000) BP: 141/91 (11/13 0400) Pulse Rate: 79 (11/13 0300)  Labs: Recent Labs    10/31/19 0512 10/31/19 0816  10/31/19 1524 10/31/19 2238 11/01/19 0353 11/01/19 1601 11/01/19 2326 11/02/19 0338  HGB 9.3* 9.5*  --   --   --  9.2*  --   --   --   HCT 29.9* 30.0*  --   --   --  29.7*  --   --   --   PLT 94* 95*  --   --   --  82*  --   --   --   APTT 124*  --    < >  --   --  145*  --  58* 73*  HEPARINUNFRC 0.19* 0.22*  --   --  0.36 0.36  --   --   --   CREATININE 1.91*  --   --  1.75*  --  1.49* 1.46*  --   --    < > = values in this interval not displayed.    Estimated Creatinine Clearance: 66.2 mL/min (A) (by C-G formula based on SCr of 1.46 mg/dL (H)).   Assessment: 51 yoF with PMH DM2, HTN, presents with R hip pain after falling. Also reports several months of LE edema, orthopnea, and abdominal distention. Found to have elevated BNP and to also be in AFib with RVR.  Patient has been on IV heparin, now with positive HIT antibody.  Pharmacy has been consulted to transition from IV heparin to bivalirudin.  Renal aware all heparin through CRRT will be discontinued.  Second aPTT therapeutic at 73 sec.  Goal of Therapy: APTT 50-85 sec Monitor platelets by anticoagulation protocol: Yes  Plan: Continue bivalirudin at 0.05 mg/kg/hr Daily aPTT and CBC F/u SRA  Alanda Slim, PharmD, [facility] Clinical Pharmacist Please see AMION for all Pharmacists' Contact Phone Numbers 11/02/2019, 4:33 AM

## 2019-11-02 NOTE — Progress Notes (Signed)
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 Heparin     +HIT Ab 11/01/19    Patient Measurements: Height: 5\' 8"  (172.7 cm) Weight: 295 lb 13.7 oz (134.2 kg) IBW/kg (Calculated) : 63.9  Vital Signs: Temp: 98 F (36.7 C) (11/13 1641) Temp Source: Oral (11/13 1641) BP: 134/79 (11/13 1820) Pulse Rate: 97 (11/13 1820)  Labs: Recent Labs    10/31/19 0816  10/31/19 2238 11/01/19 0353 11/01/19 1601 11/01/19 2326 11/02/19 0338 11/02/19 1342 11/02/19 1806  HGB 9.5*  --   --  9.2*  --   --  9.1*  --   --   HCT 30.0*  --   --  29.7*  --   --  29.4*  --   --   PLT 95*  --   --  82*  --   --  68*  --   --   APTT  --    < >  --  145*  --  58* 73*  --  68*  HEPARINUNFRC 0.22*  --  0.36 0.36  --   --   --   --   --   CREATININE  --    < >  --  1.49* 1.46*  --  1.32* 1.69*  --    < > = values in this interval not displayed.    Estimated Creatinine Clearance: 57.2 mL/min (A) (by C-G formula based on SCr of 1.69 mg/dL (H)).   Assessment: 86 yoF with PMH DM2, HTN, presents with R hip pain after falling. Also reports several months of LE edema, orthopnea, and abdominal distention. Found to have elevated BNP and to also be in AFib with RVR.  Patient has been on IV heparin, now with positive HIT antibody.  Pharmacy has been consulted to transition from IV heparin to bivalirudin.  APTT remains therapeutic off CRRT.  No bleeding reported.  Goal of Therapy: APTT 50-85 sec Monitor platelets by anticoagulation protocol: Yes  Plan: Continue bivalirudin at 0.05 mg/kg/hr Daily aPTT and CBC F/U SRA  Jesscia Imm D. Mina Marble, PharmD, BCPS, [place] 11/02/2019, 6:49 PM

## 2019-11-02 NOTE — Plan of Care (Signed)
  Problem: Clinical Measurements: Goal: Ability to maintain clinical measurements within normal limits will improve Outcome: Progressing   Problem: Clinical Measurements: Goal: Respiratory complications will improve Outcome: Progressing   Problem: Clinical Measurements: Goal: Cardiovascular complication will be avoided Outcome: Progressing   

## 2019-11-02 NOTE — Progress Notes (Signed)
Pharmacy Heparin Induced Thrombocytopenia (HIT) Note:  Adrienne Lane is an 53 y.o. female being evaluated for HIT. Heparin was started 10/31 for atrial fib, and baseline platelets were 173.   HIT labs were ordered on 11/11 when platelets dropped to 95.  Auto-populate labs:  Heparin Induced Plt Ab  Date/Time Value Ref Range Status  10/31/2019 03:24 PM 0.983 (H) 0.000 - 0.400 OD Final    Comment:    (NOTE) Performed At: [facility] [place], [place] [id number] Rush Farmer MD VF:[id number]      CALCULATE SCORE:  4Ts (see the HIT Algorithm) Score  Thrombocytopenia 2  Timing 1  Thrombosis 0  Other causes of thrombocytopenia 1  Total 7     Recommendations (A or B) are based on available lab results (HIT antibody and/or SRA) and the HIT algorithm    A. HIT antibody result available  Possible HIT    Order SRA:  Yes  Discontinue heparin / LMWH:  Yes  Initiate alternative anticoagulation:  Yes  Document heparin allergy:  Yes    B. SRA result availability  SRA not available   Name of MD Contacted: [place] (Discussed with provider) Labs ordered:  SRA ordered  Heparin allergy:  Heparin allergy documented or updated. Anticoagulation plans:  Begin alternative anticoagulation with bivalirudin   Comments (List any alternative plans or if there are contraindications to therapy)   Arrie Senate, PharmD, BCPS Clinical Pharmacist [phone] Please check AMION for all [place] numbers 11/02/2019

## 2019-11-03 LAB — CBC
HCT: 29.9 % — ABNORMAL LOW (ref 36.0–46.0)
Hemoglobin: 9 g/dL — ABNORMAL LOW (ref 12.0–15.0)
MCH: 24.4 pg — ABNORMAL LOW (ref 26.0–34.0)
MCHC: 30.1 g/dL (ref 30.0–36.0)
MCV: 81 fL (ref 80.0–100.0)
Platelets: 75 10*3/uL — ABNORMAL LOW (ref 150–400)
RBC: 3.69 MIL/uL — ABNORMAL LOW (ref 3.87–5.11)
RDW: 20.6 % — ABNORMAL HIGH (ref 11.5–15.5)
WBC: 4.9 10*3/uL (ref 4.0–10.5)
nRBC: 0 % (ref 0.0–0.2)

## 2019-11-03 LAB — GLUCOSE, CAPILLARY
Glucose-Capillary: 163 mg/dL — ABNORMAL HIGH (ref 70–99)
Glucose-Capillary: 174 mg/dL — ABNORMAL HIGH (ref 70–99)
Glucose-Capillary: 220 mg/dL — ABNORMAL HIGH (ref 70–99)
Glucose-Capillary: 152 mg/dL — ABNORMAL HIGH (ref 70–99)

## 2019-11-03 LAB — RENAL FUNCTION PANEL
Albumin: 3.5 g/dL (ref 3.5–5.0)
Anion gap: 10 (ref 5–15)
BUN: 15 mg/dL (ref 6–20)
CO2: 28 mmol/L (ref 22–32)
Calcium: 9.3 mg/dL (ref 8.9–10.3)
Chloride: 97 mmol/L — ABNORMAL LOW (ref 98–111)
Creatinine, Ser: 1.81 mg/dL — ABNORMAL HIGH (ref 0.44–1.00)
GFR calc Af Amer: 37 mL/min — ABNORMAL LOW (ref >60)
GFR calc non Af Amer: 32 mL/min — ABNORMAL LOW (ref >60)
Glucose, Bld: 189 mg/dL — ABNORMAL HIGH (ref 70–99)
Phosphorus: 2.5 mg/dL (ref 2.5–4.6)
Potassium: 4 mmol/L (ref 3.5–5.1)
Sodium: 135 mmol/L (ref 135–145)

## 2019-11-03 LAB — APTT: aPTT: 66 seconds — ABNORMAL HIGH (ref 24–36)

## 2019-11-03 LAB — MAGNESIUM: Magnesium: 1.9 mg/dL (ref 1.7–2.4)

## 2019-11-03 MED ORDER — ISOSORBIDE MONONITRATE ER 60 MG PO TB24
60.0000 mg | ORAL_TABLET | Freq: Every day | ORAL | Status: DC
  Administered 2019-11-03 – 2019-11-12 (×10): 60 mg via ORAL
  Filled 2019-11-03 (×10): qty 1

## 2019-11-03 MED ORDER — HYDRALAZINE HCL 50 MG PO TABS
50.0000 mg | ORAL_TABLET | Freq: Three times a day (TID) | ORAL | Status: DC
  Administered 2019-11-03 – 2019-11-04 (×3): 50 mg via ORAL
  Filled 2019-11-03 (×3): qty 1

## 2019-11-03 NOTE — Significant Event (Signed)
Patient has an order to remove HD catheter. However, Dr. Aundra Dubin would like to keep HD line as it is used to transduce CVP via the pigtail port. RN called office of Dr. Joelyn Oms as there is no phone number listed in the unit for him nor in [place]. Spoke to receptionist and left call back message to [employer] PA.   11:16am: Dr. Jonnie Finner called back and updated regarding HD. Per Dr. Jonnie Finner, can leave HD as is.    Lita Flynn

## 2019-11-03 NOTE — Progress Notes (Signed)
[facility] Progress Note  Subjective:   3.5L UOP on 160 TID IV lasix, off CRRT , net 2.6L neg and 2kg down  SCr 1.8, K 4.0, Mg 1.9  Has R IJ Temp HD cath  Vitals:   11/03/19 0650 11/03/19 0700 11/03/19 0746 11/03/19 0800  BP:    135/64  Pulse:  (!) 110  97  Resp:  (!) 22  (!) 22  Temp: 98.4 F (36.9 C)  98.9 F (37.2 C)   TempSrc: Oral  Oral   SpO2:  98%  96%  Weight:      Height:        Inpatient medications: . amiodarone  200 mg Oral BID  . Chlorhexidine Gluconate Cloth  6 each Topical Daily  . hydrALAZINE  50 mg Oral Q8H  . insulin aspart  0-5 Units Subcutaneous QHS  . insulin aspart  0-9 Units Subcutaneous TID WC  . insulin glargine  12 Units Subcutaneous Daily  . isosorbide mononitrate  60 mg Oral Daily  . sodium chloride flush  10-40 mL Intracatheter Q12H   . bivalirudin (ANGIOMAX) infusion 0.5 mg/mL (Non-ACS indications) 0.05 mg/kg/hr (11/03/19 0800)  . furosemide Stopped (11/03/19 6222)   acetaminophen **OR** acetaminophen, alteplase, docusate sodium, Gerhardt's butt cream, HYDROcodone-acetaminophen, ondansetron (ZOFRAN) IV, sodium chloride flush    Exam: Gen: morbidly obese AAF in NAD R IJ Temp HD Cath c/d/i CVS: no rub Resp: decreased BS Abd: obese, +BS, soft, NT Ext: 1 + doughy edema LE's bilat to hips    CXR today - clear  Renal US - 11 cm kidneys, large bilateral renal staghorn calculi, better visualized on CT. No definite hydronephrosis, although poorly evaluated. Bladder decompressed by indwelling Foley catheter.   CT renal stone 11/3 > The kidneys demonstrate extensive bilateral staghorn calculi. No visible hydronephrosis. Difficult to follow the course of either ureter given lack of contrast and fluid in the abdomen. No contour deforming renal lesions are seen. There is circumferential bladder wall thickening.   ECHO 10/21/19 >  Left Ventricle: Left ventricular ejection fraction, by visual estimation, is 20 to 25%. The left  ventricle has severely decreased function. The left ventricle demonstrates global hypokinesis. There is mildly increased left ventricular hypertrophy. Left ventricular diastolic parameters are indeterminate. Normal left atrial pressure.   UA 11/3 > > 50 rbc/ wbc per hpf, prot 100   Urine prot-creat ratio = 1.0   UNa 87, UCr 39 on 11/7 (on lasix)   Assessment/Plan: 1. AKI/CKD- in setting of anasarca, atrial fibrillation with RVR, and new low EF 20--25%. Suspected cardiorenal.  Baseline creat prob around 1.7- 1.9. Anasarca did not improve w/ high dose IV diuretics and so was started on CRRT on 11/9-11/13.  Serologic w/u essentially negative except mild ^ of both ANCA's.  Now clearly recovering GFR and diuretics responsive. Off CRRT.  Remove HD cath today. Cont diuretics 2. HTN - on hydral/ imdur, BP's good 3. New onset, acute systolic CHF EF 97-98%, per AHF 4. Atrial fibrillation- rate controlled with amiodarone.  ? HIT, bivalirudin 5. Bilateral staghorn calculi- evaluated by Urology. No evidence of hydronephrosis so no indication for nephrostomy tubes 6. DM-poorly controlled with Hgb A1c 11.7%.per primary 7. Right hip pain s/p mechanical fall- no fx 8. Anemia- normocytic.Low iron will replete and follow 9. Uterine mass- is a large fibroid no change from CT in 2019  Rexene Agent MD 10/27/2019, 10:09 PM  Iron/TIBC/Ferritin/ %Sat    Component Value Date/Time   IRON 44 10/21/2019 0703  TIBC 355 10/21/2019 0703   FERRITIN 20 10/21/2019 0703   IRONPCTSAT 12 10/21/2019 0703   Recent Labs  Lab 11/03/19 0458  NA 135  K 4.0  CL 97*  CO2 28  GLUCOSE 189*  BUN 15  CREATININE 1.81*  CALCIUM 9.3  PHOS 2.5  ALBUMIN 3.5   No results for input(s): AST, ALT, ALKPHOS, BILITOT, PROT in the last 168 hours. Recent Labs  Lab 11/03/19 0458  WBC 4.9  HGB 9.0*  HCT 29.9*  PLT 75*

## 2019-11-03 NOTE — Progress Notes (Signed)
Patient ID: Adrienne Lane, female   DOB: 29-Feb-1966, 53 y.o.   MRN: [id number]     Advanced Heart Failure Rounding Note  PCP-Cardiologist: Donato Heinz, MD   Subjective:    Transferred from [facility] to [facility] for HF management. Marked volume overload. Started on CVVHD.  CVVH stopped 11/13, high dose Lasix continued.  Weight down 4 lbs.  CVP 15.   Continues to feel better. Denies SOB, orthopnea or PND.  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  SBP 140s.   She is in NSR.    Objective:   Weight Range: 132.4 kg Body mass index is 44.38 kg/m.   Vital Signs:   Temp:  [98 F (36.7 C)-98.7 F (37.1 C)] 98.4 F (36.9 C) (11/14 0650) Pulse Rate:  [35-110] 97 (11/14 0800) Resp:  [17-30] 22 (11/14 0800) BP: (110-178)/(64-100) 135/64 (11/14 0800) SpO2:  [96 %-100 %] 96 % (11/14 0800) Weight:  [132.4 kg] 132.4 kg (11/14 0457) Last BM Date: 11/02/19  Weight change: Filed Weights   11/01/19 0800 11/02/19 0406 11/03/19 0457  Weight: (!) 140.4 kg 134.2 kg 132.4 kg    Intake/Output:   Intake/Output Summary (Last 24 hours) at 11/03/2019 0818 Last data filed at 11/03/2019 0800 Gross per 24 hour  Intake 706.12 ml  Output 4350 ml  Net -3643.88 ml      Physical Exam  CVP 15 General: NAD Neck: JVP 14-16 cm, no thyromegaly or thyroid nodule.  Lungs: Clear to auscultation bilaterally with normal respiratory effort. CV: Nondisplaced PMI.  Heart regular S1/S2, no S3/S4, no murmur. 2+ edema to knees.   Abdomen: Soft, nontender, no hepatosplenomegaly, no distention.  Skin: Intact without lesions or rashes.  Neurologic: Alert and oriented x 3.  Psych: Normal affect. Extremities: No clubbing or cyanosis.  HEENT: Normal.    Telemetry   NSR 90s, personally reviewed.   Labs    CBC Recent Labs    11/02/19 0338 11/03/19 0458  WBC 4.9 4.9  HGB 9.1* 9.0*  HCT 29.4* 29.9*  MCV 81.4 81.0  PLT 68* 75*   Basic Metabolic Panel Recent Labs    11/02/19 0338 11/02/19 1342 11/03/19 0458  NA 139  136 135  K 3.6 4.1 4.0  CL 100 100 97*  CO2 28 27 28   GLUCOSE 171* 230* 189*  BUN 13 12 15   CREATININE 1.32* 1.69* 1.81*  CALCIUM 9.1 9.0 9.3  MG 2.2  --  1.9  PHOS 2.1*  --  2.5   Liver Function Tests Recent Labs    11/02/19 0338 11/03/19 0458  ALBUMIN 3.4* 3.5   No results for input(s): LIPASE, AMYLASE in the last 72 hours. Cardiac Enzymes No results for input(s): CKTOTAL, CKMB, CKMBINDEX, TROPONINI in the last 72 hours.  BNP: BNP (last 3 results) Recent Labs    10/20/19 2029  BNP 1,623.1*    ProBNP (last 3 results) No results for input(s): PROBNP in the last 8760 hours.   D-Dimer No results for input(s): DDIMER in the last 72 hours. Hemoglobin A1C No results for input(s): HGBA1C in the last 72 hours. Fasting Lipid Panel No results for input(s): CHOL, HDL, LDLCALC, TRIG, CHOLHDL, LDLDIRECT in the last 72 hours. Thyroid Function Tests No results for input(s): TSH, T4TOTAL, T3FREE, THYROIDAB in the last 72 hours.  Invalid input(s): FREET3  Other results:   Imaging    No results found.   Medications:     Scheduled Medications: . amiodarone  200 mg Oral BID  . Chlorhexidine Gluconate Cloth  6 each  Topical Daily  . hydrALAZINE  37.5 mg Oral Q8H  . insulin aspart  0-5 Units Subcutaneous QHS  . insulin aspart  0-9 Units Subcutaneous TID WC  . insulin glargine  12 Units Subcutaneous Daily  . isosorbide mononitrate  30 mg Oral Daily  . sodium chloride flush  10-40 mL Intracatheter Q12H    Infusions: . bivalirudin (ANGIOMAX) infusion 0.5 mg/mL (Non-ACS indications) 0.05 mg/kg/hr (11/03/19 0800)  . furosemide Stopped (11/03/19 0354)    PRN Medications: acetaminophen **OR** acetaminophen, alteplase, docusate sodium, Gerhardt's butt cream, HYDROcodone-acetaminophen, ondansetron (ZOFRAN) IV, sodium chloride flush     Assessment/Plan   1. Acute Systolic Heart Failure - BNP >1600 on admit. ECHO this admit  EF 20-25%, pericardial effusion, mild LVH,  RA/LA dilated.  RV down. Possible Tachy-mediated cardiomyopathy versus HTN.  - Started CVVHD on 11/9 due to poor response to IV diuresis and rising creatinine. CVP initially 32 now 15.  CVVH stopped 11/13.   - Weight continues to come down.  Still volume overloaded.  Continue Lasix 160 mg IV q8 hrs today.  - Plan RHC on Monday after full fluid removal - Not candidate for coronary angio with AKI - Increase hydralazine to 50 mg three times a day and Imdur to 60 mg daily.  - No b-b, ACE/ARB or spiro with ARF - PVCs may be contributing to CM. Continue amio 200 bid - Consult CR  2. Paroxysmal A fib RVR - On admit. Placed on amio drip.  - Chemically converted to NSR  - Maintaining NSR on po amio. Heparin off due to + HIT screen. On bivalirudin.   3. HTN  - SBP 140s. Not on Bidil due to lack of insurance -> use hydralazine/imdur  - Would not drop SBP < 120 to preserve renal perfusion  - See above.   4. AKI  - Creatinine on admit 1.76.  Creatinine worsened with diuresis. Marked volume overload. Started on CVVHD on 11/9 and stopped 11/13.  Now good UOP.   - Renal following. See discussion above - Urology saw: Bilateral staghorn calculi. No hydronephrosis so no role for percutaneous nephrostomy tubes.    5. Uncontrolled DM - Hgb A1C 11.7  - TRH managing On SS.   6. Uterine Mass - Likely fibroid per GYN. Needs outpatient follow up.   7. Anemia  - Hgb stable at 9.1  8. Thrombocytopenia - Platelets dropping. 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  Now on bivalirudin.   - No bleeding   Length of Stay: [place], MD  11/03/2019, 8:18 AM  Advanced Heart Failure Team Pager [phone] (M-F; 7a - 4p)  Please contact [facility] for night-coverage after hours (4p -7a ) and weekends on [email or web address]

## 2019-11-03 NOTE — Progress Notes (Signed)
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 Heparin     +HIT Ab 11/01/19    Patient Measurements: Height: 5\' 8"  (172.7 cm) Weight: 291 lb 14.2 oz (132.4 kg) IBW/kg (Calculated) : 63.9  Vital Signs: Temp: 98.9 F (37.2 C) (11/14 0746) Temp Source: Oral (11/14 0746) BP: 152/95 (11/14 0900) Pulse Rate: 94 (11/14 1000)  Labs: Recent Labs    10/31/19 2238  11/01/19 0353  11/02/19 0338 11/02/19 1342 11/02/19 1806 11/03/19 0458 11/03/19 0736  HGB  --    < > 9.2*  --  9.1*  --   --  9.0*  --   HCT  --   --  29.7*  --  29.4*  --   --  29.9*  --   PLT  --   --  82*  --  68*  --   --  75*  --   APTT  --   --  145*   < > 73*  --  68*  --  66*  HEPARINUNFRC 0.36  --  0.36  --   --   --   --   --   --   CREATININE  --   --  1.49*   < > 1.32* 1.69*  --  1.81*  --    < > = values in this interval not displayed.    Estimated Creatinine Clearance: 53 mL/min (A) (by C-G formula based on SCr of 1.81 mg/dL (H)).   Assessment: 49 yoF with PMH DM2, HTN, presents with R hip pain after falling. Also reports several months of LE edema, orthopnea, and abdominal distention. Found to have elevated BNP and to also be in AFib with RVR.  Patient has been on IV heparin, now with positive HIT antibody.  Pharmacy consulted to transition from IV heparin to bivalirudin.  APTT remains therapeutic off CRRT.  No bleeding reported.  Platelet count beginning to recover.  Goal of Therapy: APTT 50-85 sec Monitor platelets by anticoagulation protocol: Yes  Plan: Continue bivalirudin at 0.05 mg/kg/hr Daily aPTT and CBC F/U SRA Oral anticoagulation eventually once platelets recover.  Marguerite Olea, [facility] Clinical Pharmacist Phone [phone]  11/03/2019 10:21 AM

## 2019-11-04 DIAGNOSIS — I5043 Acute on chronic combined systolic (congestive) and diastolic (congestive) heart failure: Secondary | ICD-10-CM

## 2019-11-04 LAB — GLUCOSE, CAPILLARY
Glucose-Capillary: 167 mg/dL — ABNORMAL HIGH (ref 70–99)
Glucose-Capillary: 175 mg/dL — ABNORMAL HIGH (ref 70–99)
Glucose-Capillary: 195 mg/dL — ABNORMAL HIGH (ref 70–99)
Glucose-Capillary: 170 mg/dL — ABNORMAL HIGH (ref 70–99)

## 2019-11-04 LAB — CBC
HCT: 30.5 % — ABNORMAL LOW (ref 36.0–46.0)
Hemoglobin: 9.3 g/dL — ABNORMAL LOW (ref 12.0–15.0)
MCH: 24.8 pg — ABNORMAL LOW (ref 26.0–34.0)
MCHC: 30.5 g/dL (ref 30.0–36.0)
MCV: 81.3 fL (ref 80.0–100.0)
Platelets: 92 10*3/uL — ABNORMAL LOW (ref 150–400)
RBC: 3.75 MIL/uL — ABNORMAL LOW (ref 3.87–5.11)
RDW: 20.7 % — ABNORMAL HIGH (ref 11.5–15.5)
WBC: 5.3 10*3/uL (ref 4.0–10.5)
nRBC: 0 % (ref 0.0–0.2)

## 2019-11-04 LAB — RENAL FUNCTION PANEL
Albumin: 3.4 g/dL — ABNORMAL LOW (ref 3.5–5.0)
Anion gap: 12 (ref 5–15)
BUN: 18 mg/dL (ref 6–20)
CO2: 28 mmol/L (ref 22–32)
Calcium: 9.5 mg/dL (ref 8.9–10.3)
Chloride: 99 mmol/L (ref 98–111)
Creatinine, Ser: 1.91 mg/dL — ABNORMAL HIGH (ref 0.44–1.00)
GFR calc Af Amer: 35 mL/min — ABNORMAL LOW (ref >60)
GFR calc non Af Amer: 30 mL/min — ABNORMAL LOW (ref >60)
Glucose, Bld: 125 mg/dL — ABNORMAL HIGH (ref 70–99)
Phosphorus: 2.9 mg/dL (ref 2.5–4.6)
Potassium: 3.5 mmol/L (ref 3.5–5.1)
Sodium: 139 mmol/L (ref 135–145)

## 2019-11-04 LAB — APTT: aPTT: 73 seconds — ABNORMAL HIGH (ref 24–36)

## 2019-11-04 LAB — MAGNESIUM: Magnesium: 1.6 mg/dL — ABNORMAL LOW (ref 1.7–2.4)

## 2019-11-04 MED ORDER — MAGNESIUM SULFATE 4 GM/100ML IV SOLN
4.0000 g | Freq: Once | INTRAVENOUS | Status: AC
  Administered 2019-11-04: 4 g via INTRAVENOUS
  Filled 2019-11-04: qty 100

## 2019-11-04 MED ORDER — FUROSEMIDE 10 MG/ML IJ SOLN
120.0000 mg | Freq: Two times a day (BID) | INTRAVENOUS | Status: DC
  Administered 2019-11-04 – 2019-11-08 (×9): 120 mg via INTRAVENOUS
  Filled 2019-11-04: qty 10
  Filled 2019-11-04: qty 12
  Filled 2019-11-04: qty 10
  Filled 2019-11-04 (×2): qty 12
  Filled 2019-11-04: qty 10
  Filled 2019-11-04: qty 2
  Filled 2019-11-04 (×4): qty 12
  Filled 2019-11-04: qty 10

## 2019-11-04 MED ORDER — POTASSIUM CHLORIDE CRYS ER 20 MEQ PO TBCR
40.0000 meq | EXTENDED_RELEASE_TABLET | Freq: Four times a day (QID) | ORAL | Status: AC
  Administered 2019-11-04 (×2): 40 meq via ORAL
  Filled 2019-11-04 (×2): qty 2

## 2019-11-04 MED ORDER — HYDRALAZINE HCL 50 MG PO TABS
75.0000 mg | ORAL_TABLET | Freq: Three times a day (TID) | ORAL | Status: DC
  Administered 2019-11-04 – 2019-11-06 (×6): 75 mg via ORAL
  Filled 2019-11-04 (×6): qty 1

## 2019-11-04 NOTE — Plan of Care (Signed)
Progressing well, performing own ADLs. Ambulates to/from bathroom with steady gait. Intermittently has dyspnea with exertion but no drop in O2 sats, remains on RA. Diuresing well.   In depth discussion about Heart Failure, fluid restrictions, dietary changes, daily weights, edema, when to call MD. Adrienne Lane understanding and desire to make lifestyle changes for improved health.    Problem: Education: Goal: Knowledge of General Education information will improve Description: Including pain rating scale, medication(s)/side effects and non-pharmacologic comfort measures Outcome: Progressing   Problem: Health Behavior/Discharge Planning: Goal: Ability to manage health-related needs will improve Outcome: Progressing   Problem: Clinical Measurements: Goal: Ability to maintain clinical measurements within normal limits will improve Outcome: Progressing Goal: Will remain free from infection Outcome: Progressing Goal: Diagnostic test results will improve Outcome: Progressing Goal: Respiratory complications will improve Outcome: Progressing Goal: Cardiovascular complication will be avoided Outcome: Progressing   Problem: Activity: Goal: Risk for activity intolerance will decrease Outcome: Progressing   Problem: Nutrition: Goal: Adequate nutrition will be maintained Outcome: Progressing   Problem: Coping: Goal: Level of anxiety will decrease Outcome: Progressing   Problem: Elimination: Goal: Will not experience complications related to bowel motility Outcome: Progressing Goal: Will not experience complications related to urinary retention Outcome: Progressing   Problem: Pain Managment: Goal: General experience of comfort will improve Outcome: Progressing   Problem: Safety: Goal: Ability to remain free from injury will improve Outcome: Progressing   Problem: Skin Integrity: Goal: Risk for impaired skin integrity will decrease Outcome: Progressing

## 2019-11-04 NOTE — Progress Notes (Signed)
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 Heparin     +HIT Ab 11/01/19    Patient Measurements: Height: 5\' 8"  (172.7 cm) Weight: 281 lb 4.9 oz (127.6 kg)(checked x 2, standing scale, voided 7.6 L in 24 hr) IBW/kg (Calculated) : 63.9  Vital Signs: Temp: 98.2 F (36.8 C) (11/15 0345) Temp Source: Oral (11/15 0345) BP: 166/95 (11/15 0545) Pulse Rate: 72 (11/15 0700)  Labs: Recent Labs    11/02/19 0338 11/02/19 1342 11/02/19 1806 11/03/19 0458 11/03/19 0736 11/04/19 0304  HGB 9.1*  --   --  9.0*  --  9.3*  HCT 29.4*  --   --  29.9*  --  30.5*  PLT 68*  --   --  75*  --  92*  APTT 73*  --  68*  --  66* 73*  CREATININE 1.32* 1.69*  --  1.81*  --  1.91*    Estimated Creatinine Clearance: 49.2 mL/min (A) (by C-G formula based on SCr of 1.91 mg/dL (H)).   Assessment: 34 yoF with PMH DM2, HTN, presents with R hip pain after falling. Also reports several months of LE edema, orthopnea, and abdominal distention. Found to have elevated BNP and to also be in AFib with RVR.  Patient has been on IV heparin, now with positive HIT antibody.  Pharmacy consulted to transition from IV heparin to bivalirudin.  APTT remains therapeutic off CRRT.  No bleeding reported.  Platelet count beginning to recover.  Goal of Therapy: APTT 50-85 sec Monitor platelets by anticoagulation protocol: Yes  Plan: Continue bivalirudin at 0.05 mg/kg/hr Daily aPTT and CBC F/U SRA Oral anticoagulation eventually once platelets recover.  Marguerite Olea, [facility] Clinical Pharmacist Phone [phone]  11/04/2019 7:38 AM

## 2019-11-04 NOTE — Progress Notes (Signed)
Patient ID: Adrienne Lane, female   DOB: 17-Feb-1966, 53 y.o.   MRN: [id number]     Advanced Heart Failure Rounding Note  PCP-Cardiologist: Donato Heinz, MD   Subjective:    Transferred from [facility] to [facility] for HF management. Marked volume overload. Started on CVVHD.  CVVH stopped 11/13, high dose Lasix continued.  Weight down 10 lbs over the last day with marked diuresis.  CVP 16 today.  Creatinine 1.9 up from 1.8.   Continues to feel better, walked in hall. Denies SOB, orthopnea or PND.  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  SBP 150s.   She is in NSR.    Objective:   Weight Range: 127.6 kg Body mass index is 42.77 kg/m.   Vital Signs:   Temp:  [98.2 F (36.8 C)-98.8 F (37.1 C)] 98.8 F (37.1 C) (11/15 0744) Pulse Rate:  [53-103] 72 (11/15 0700) Resp:  [15-26] 20 (11/15 0700) BP: (128-166)/(71-97) (P) 157/90 (11/15 0700) SpO2:  [94 %-100 %] 98 % (11/15 0700) Weight:  [127.6 kg] 127.6 kg (11/15 0545) Last BM Date: 11/03/19  Weight change: Filed Weights   11/02/19 0406 11/03/19 0457 11/04/19 0545  Weight: 134.2 kg 132.4 kg 127.6 kg    Intake/Output:   Intake/Output Summary (Last 24 hours) at 11/04/2019 0818 Last data filed at 11/04/2019 0746 Gross per 24 hour  Intake 1567.05 ml  Output 8000 ml  Net -6432.95 ml      Physical Exam  CVP 16 General: NAD Neck: JVP 14 cm, no thyromegaly or thyroid nodule.  Lungs: Clear to auscultation bilaterally with normal respiratory effort. CV: Nondisplaced PMI.  Heart regular S1/S2, no S3/S4, no murmur. 1+ ankle edema.   Abdomen: Soft, nontender, no hepatosplenomegaly, no distention.  Skin: Intact without lesions or rashes.  Neurologic: Alert and oriented x 3.  Psych: Normal affect. Extremities: No clubbing or cyanosis.  HEENT: Normal.    Telemetry   NSR 90s, personally reviewed.   Labs    CBC Recent Labs    11/03/19 0458 11/04/19 0304  WBC 4.9 5.3  HGB 9.0* 9.3*  HCT 29.9* 30.5*  MCV 81.0 81.3  PLT  75* 92*   Basic Metabolic Panel Recent Labs    11/03/19 0458 11/04/19 0304  NA 135 139  K 4.0 3.5  CL 97* 99  CO2 28 28  GLUCOSE 189* 125*  BUN 15 18  CREATININE 1.81* 1.91*  CALCIUM 9.3 9.5  MG 1.9 1.6*  PHOS 2.5 2.9   Liver Function Tests Recent Labs    11/03/19 0458 11/04/19 0304  ALBUMIN 3.5 3.4*   No results for input(s): LIPASE, AMYLASE in the last 72 hours. Cardiac Enzymes No results for input(s): CKTOTAL, CKMB, CKMBINDEX, TROPONINI in the last 72 hours.  BNP: BNP (last 3 results) Recent Labs    10/20/19 2029  BNP 1,623.1*    ProBNP (last 3 results) No results for input(s): PROBNP in the last 8760 hours.   D-Dimer No results for input(s): DDIMER in the last 72 hours. Hemoglobin A1C No results for input(s): HGBA1C in the last 72 hours. Fasting Lipid Panel No results for input(s): CHOL, HDL, LDLCALC, TRIG, CHOLHDL, LDLDIRECT in the last 72 hours. Thyroid Function Tests No results for input(s): TSH, T4TOTAL, T3FREE, THYROIDAB in the last 72 hours.  Invalid input(s): FREET3  Other results:   Imaging    No results found.   Medications:     Scheduled Medications: . amiodarone  200 mg Oral BID  . Chlorhexidine Gluconate Cloth  6 each Topical Daily  . hydrALAZINE  75 mg Oral Q8H  . insulin aspart  0-5 Units Subcutaneous QHS  . insulin aspart  0-9 Units Subcutaneous TID WC  . insulin glargine  12 Units Subcutaneous Daily  . isosorbide mononitrate  60 mg Oral Daily  . potassium chloride  40 mEq Oral Q6H  . sodium chloride flush  10-40 mL Intracatheter Q12H    Infusions: . bivalirudin (ANGIOMAX) infusion 0.5 mg/mL (Non-ACS indications) 0.05 mg/kg/hr (11/03/19 1900)  . furosemide    . magnesium sulfate bolus IVPB      PRN Medications: acetaminophen **OR** acetaminophen, alteplase, docusate sodium, Gerhardt's butt cream, HYDROcodone-acetaminophen, ondansetron (ZOFRAN) IV, sodium chloride flush     Assessment/Plan   1. Acute Systolic  Heart Failure - BNP >1600 on admit. ECHO this admit  EF 20-25%, pericardial effusion, mild LVH, RA/LA dilated.  RV down. Possible Tachy-mediated cardiomyopathy versus HTN.  - Started CVVHD on 11/9 due to poor response to IV diuresis and rising creatinine. CVP initially 32, now 16.  CVVH stopped 11/13.   - Weight continues to come down, marked diuresis yesterday with 10 lb weight loss and stable creatinine.  Still volume overloaded, will decrease Lasix to 120 mg IV bid given marked diuresis yesterday.  - Follow co-ox.  - RHC after full diuresis, ?Monday versus Tuesday.  - Not candidate for coronary angio with AKI - Increase hydralazine to 75 mg three times a day and continue Imdur 60 mg daily.  - No b-b, ACE/ARB or spiro with ARF - PVCs may be contributing to CM. Continue amiodarone 200 bid - Consult CR  2. Paroxysmal A fib RVR - On admit. Placed on amio drip.  - Chemically converted to NSR  - Maintaining NSR on po amio. Heparin off due to + HIT screen. On bivalirudin, would eventually start him on apixaban.     3. HTN  - SBP 150s. Not on Bidil due to lack of insurance -> use hydralazine/imdur  - Would not drop SBP < 120 to preserve renal perfusion  - See above.   4. AKI  - Creatinine on admit 1.76.  Creatinine worsened with diuresis. Marked volume overload. Started on CVVHD on 11/9 and stopped 11/13.  Now good UOP, creatinine stable at 1.9 off CVVH.   - Renal following. See discussion above - Urology saw: Bilateral staghorn calculi. No hydronephrosis so no role for percutaneous nephrostomy tubes.    5. Uncontrolled DM - Hgb A1C 11.7  - TRH managing On SS.   6. Uterine Mass - Likely fibroid per GYN. Needs outpatient follow up.   7. Anemia  - Hgb stable at 9.1  8. Thrombocytopenia - Platelets starting to rise again, 92 K today.  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  Now on bivalirudin.  Plan long-term anticoagulation with apixaban.  - No bleeding   Length of Stay: [place], MD   11/04/2019, 8:18 AM  Advanced Heart Failure Team Pager [phone] (M-F; 7a - 4p)  Please contact [facility] for night-coverage after hours (4p -7a ) and weekends on [email or web address]

## 2019-11-04 NOTE — Progress Notes (Signed)
[facility] Progress Note  Subjective:   8L!!! UOP on 160 TID IV lasix, off CRRT , net 6.4L neg and 5kg down  Stable SCr 1.9, K 3.5, Mg 1.8  Still has R IJ Temp HD cath, AHF wanted CVPs  No c/o feels good, is OOB to chair.  Vitals:   11/04/19 0600 11/04/19 0700 11/04/19 0744 11/04/19 0814  BP:  (!) 157/90  (!) 143/85  Pulse: (!) 53 72  (!) 103  Resp: 18 20  (!) 25  Temp:   98.8 F (37.1 C)   TempSrc:   Oral   SpO2: 99% 98%  98%  Weight:      Height:        Inpatient medications: . amiodarone  200 mg Oral BID  . Chlorhexidine Gluconate Cloth  6 each Topical Daily  . hydrALAZINE  75 mg Oral Q8H  . insulin aspart  0-5 Units Subcutaneous QHS  . insulin aspart  0-9 Units Subcutaneous TID WC  . insulin glargine  12 Units Subcutaneous Daily  . isosorbide mononitrate  60 mg Oral Daily  . potassium chloride  40 mEq Oral Q6H  . sodium chloride flush  10-40 mL Intracatheter Q12H   . bivalirudin (ANGIOMAX) infusion 0.5 mg/mL (Non-ACS indications) 0.05 mg/kg/hr (11/04/19 1000)  . furosemide     acetaminophen **OR** acetaminophen, alteplase, docusate sodium, Gerhardt's butt cream, HYDROcodone-acetaminophen, ondansetron (ZOFRAN) IV, sodium chloride flush    Exam: Gen: morbidly obese AAF in NAD R IJ Temp HD Cath c/d/i CVS: no rub Resp: decreased BS Abd: obese, +BS, soft, NT Ext: 1 + doughy edema LE's bilat to hips    CXR today - clear  Renal US - 11 cm kidneys, large bilateral renal staghorn calculi, better visualized on CT. No definite hydronephrosis, although poorly evaluated. Bladder decompressed by indwelling Foley catheter.   CT renal stone 11/3 > The kidneys demonstrate extensive bilateral staghorn calculi. No visible hydronephrosis. Difficult to follow the course of either ureter given lack of contrast and fluid in the abdomen. No contour deforming renal lesions are seen. There is circumferential bladder wall thickening.   ECHO 10/21/19 >  Left Ventricle:  Left ventricular ejection fraction, by visual estimation, is 20 to 25%. The left ventricle has severely decreased function. The left ventricle demonstrates global hypokinesis. There is mildly increased left ventricular hypertrophy. Left ventricular diastolic parameters are indeterminate. Normal left atrial pressure.   UA 11/3 > > 50 rbc/ wbc per hpf, prot 100   Urine prot-creat ratio = 1.0   UNa 87, UCr 39 on 11/7 (on lasix)   Assessment/Plan: 1. AKI/CKD- in setting of anasarca, atrial fibrillation with RVR, and new low EF 20--25%. Suspected cardiorenal.  Baseline creat prob around 1.7- 1.9. Anasarca did not improve w/ high dose IV diuretics and so was started on CRRT on 11/9-11/13.  Serologic w/u essentially negative except mild ^ of both ANCA's.  Now clearly recovering GFR and diuretic responsive. Off CRRT with stable GFR. No futher suggestion. AHF managing diuretics. Will need to f/u at [place], will make appt 2. HTN - on hydral/ imdur, BP's good 3. New onset, acute systolic CHF EF 84-16%, per AHF 4. Atrial fibrillation- rate controlled with amiodarone.  ? HIT, bivalirudin 5. Bilateral staghorn calculi- evaluated by Urology. No evidence of hydronephrosis so no indication for nephrostomy tubes 6. DM-poorly controlled with Hgb A1c 11.7%.per primary 7. Right hip pain s/p mechanical fall- no fx 8. Anemia- normocytic.Low iron will replete and follow 9. Uterine mass- is  a large fibroid no change from CT in 2019  Will sign off for now.  Please call with any questions or concerns.  Pt does need follow up with nephrology and I will arrange.    Adrienne Lane 10/27/2019, 10:09 PM  Iron/TIBC/Ferritin/ %Sat    Component Value Date/Time   IRON 44 10/21/2019 0703   TIBC 355 10/21/2019 0703   FERRITIN 20 10/21/2019 0703   IRONPCTSAT 12 10/21/2019 0703   Recent Labs  Lab 11/04/19 0304  NA 139  K 3.5  CL 99  CO2 28  GLUCOSE 125*  BUN 18  CREATININE 1.91*  CALCIUM 9.5  PHOS 2.9   ALBUMIN 3.4*   No results for input(s): AST, ALT, ALKPHOS, BILITOT, PROT in the last 168 hours. Recent Labs  Lab 11/04/19 0304  WBC 5.3  HGB 9.3*  HCT 30.5*  PLT 92*

## 2019-11-05 LAB — CBC
HCT: 30.5 % — ABNORMAL LOW (ref 36.0–46.0)
Hemoglobin: 9.4 g/dL — ABNORMAL LOW (ref 12.0–15.0)
MCH: 24.9 pg — ABNORMAL LOW (ref 26.0–34.0)
MCHC: 30.8 g/dL (ref 30.0–36.0)
MCV: 80.7 fL (ref 80.0–100.0)
Platelets: 121 10*3/uL — ABNORMAL LOW (ref 150–400)
RBC: 3.78 MIL/uL — ABNORMAL LOW (ref 3.87–5.11)
RDW: 20.5 % — ABNORMAL HIGH (ref 11.5–15.5)
WBC: 5.8 10*3/uL (ref 4.0–10.5)
nRBC: 0 % (ref 0.0–0.2)

## 2019-11-05 LAB — RENAL FUNCTION PANEL
Albumin: 3.4 g/dL — ABNORMAL LOW (ref 3.5–5.0)
Anion gap: 13 (ref 5–15)
BUN: 21 mg/dL — ABNORMAL HIGH (ref 6–20)
CO2: 28 mmol/L (ref 22–32)
Calcium: 9.6 mg/dL (ref 8.9–10.3)
Chloride: 98 mmol/L (ref 98–111)
Creatinine, Ser: 2.15 mg/dL — ABNORMAL HIGH (ref 0.44–1.00)
GFR calc Af Amer: 30 mL/min — ABNORMAL LOW (ref >60)
GFR calc non Af Amer: 26 mL/min — ABNORMAL LOW (ref >60)
Glucose, Bld: 172 mg/dL — ABNORMAL HIGH (ref 70–99)
Phosphorus: 3.3 mg/dL (ref 2.5–4.6)
Potassium: 4 mmol/L (ref 3.5–5.1)
Sodium: 139 mmol/L (ref 135–145)

## 2019-11-05 LAB — APTT: aPTT: 71 seconds — ABNORMAL HIGH (ref 24–36)

## 2019-11-05 LAB — GLUCOSE, CAPILLARY
Glucose-Capillary: 183 mg/dL — ABNORMAL HIGH (ref 70–99)
Glucose-Capillary: 257 mg/dL — ABNORMAL HIGH (ref 70–99)
Glucose-Capillary: 140 mg/dL — ABNORMAL HIGH (ref 70–99)
Glucose-Capillary: 219 mg/dL — ABNORMAL HIGH (ref 70–99)

## 2019-11-05 LAB — MAGNESIUM: Magnesium: 2.1 mg/dL (ref 1.7–2.4)

## 2019-11-05 MED ORDER — POTASSIUM CHLORIDE CRYS ER 20 MEQ PO TBCR
20.0000 meq | EXTENDED_RELEASE_TABLET | Freq: Once | ORAL | Status: AC
  Administered 2019-11-05: 20 meq via ORAL
  Filled 2019-11-05: qty 1

## 2019-11-05 NOTE — TOC Progression Note (Signed)
Transition of Care [facility]) - Progression Note    Patient Details  Name: Adrienne Lane MRN: [id number] Date of Birth: 1966/06/12  Transition of Care [facility]) CM/SW Contact  Graves-Bigelow, Ocie Cornfield, RN Phone Number: 11/05/2019, 11:16 AM  Clinical Narrative:   CM will continue to follow for transition of care needs. Patient previously had an appointment scheduled outpatient for 11-06-19 for hospital follow up. CM did call the Clinic[facility] at [facility] and rescheduled appointment for 12-18-19 at 2:50 pm with Dr. Wynetta Emery. Pt continues to be diuresed with IV Lasix. Plan for RHC- will follow for additional transition of care needs.     Expected Discharge Plan: [place] Barriers to Discharge: Continued Medical Work up  Expected Discharge Plan and Services Expected Discharge Plan: [place]   Discharge Planning Services: Follow-up appt scheduled, CM Consult Post Acute Care Choice: Home Health Living arrangements for the past 2 months: Single Family Home                  Readmission Risk Interventions No flowsheet data found.

## 2019-11-05 NOTE — Plan of Care (Signed)

## 2019-11-05 NOTE — Progress Notes (Signed)
Patient ID: Adrienne Lane, female   DOB: 08/05/1966, 53 y.o.   MRN: [id number]     Advanced Heart Failure Rounding Note  PCP-Cardiologist: Donato Heinz, MD   Subjective:    Transferred from [facility] to [facility] for HF management. Marked volume overload. Started on CVVHD.  CVVH stopped 11/13, high dose Lasix continued.    Weight down another 10 pounds overnight. Weight down 67 pounds total.  Breathing better. No orthopnea or PND. Walking halls. Creatinine 2.15 (was 1.9 yesterday).    Objective:   Weight Range: 124.4 kg Body mass index is 41.7 kg/m.   Vital Signs:   Temp:  [98.2 F (36.8 C)-99 F (37.2 C)] 98.5 F (36.9 C) (11/16 1236) Pulse Rate:  [90-105] 100 (11/16 1200) Resp:  [16-26] 22 (11/16 1200) BP: (131-161)/(70-95) 142/87 (11/16 1200) SpO2:  [95 %-100 %] 99 % (11/16 1200) Weight:  [124.4 kg] 124.4 kg (11/16 0639) Last BM Date: 11/04/19  Weight change: Filed Weights   11/03/19 0457 11/04/19 0545 11/05/19 0639  Weight: 132.4 kg 127.6 kg 124.4 kg    Intake/Output:   Intake/Output Summary (Last 24 hours) at 11/05/2019 1428 Last data filed at 11/05/2019 1400 Gross per 24 hour  Intake 1899.8 ml  Output 4600 ml  Net -2700.2 ml      Physical Exam   CVP 17-18 General:  Sitting up in chair  No resp difficulty HEENT: normal Neck: supple. JVP to ear Carotids 2+ bilat; no bruits. No lymphadenopathy or thryomegaly appreciated. Cor: PMI nondisplaced. Mildly irregular rate & rhythm. No rubs, gallops or murmurs. Lungs: clear Abdomen: obese soft, nontender, nondistended. No hepatosplenomegaly. No bruits or masses. Good bowel sounds. Extremities: no cyanosis, clubbing, rash, 1-2+ edema Neuro: alert & orientedx3, cranial nerves grossly intact. moves all 4 extremities w/o difficulty. Affect pleasant   Telemetry   NSR 90-105, frequent PACs personally reviewed.   Labs    CBC Recent Labs    11/04/19 0304 11/05/19 0327  WBC 5.3 5.8  HGB 9.3* 9.4*  HCT 30.5*  30.5*  MCV 81.3 80.7  PLT 92* 119*   Basic Metabolic Panel Recent Labs    11/04/19 0304 11/05/19 0327  NA 139 139  K 3.5 4.0  CL 99 98  CO2 28 28  GLUCOSE 125* 172*  BUN 18 21*  CREATININE 1.91* 2.15*  CALCIUM 9.5 9.6  MG 1.6* 2.1  PHOS 2.9 3.3   Liver Function Tests Recent Labs    11/04/19 0304 11/05/19 0327  ALBUMIN 3.4* 3.4*   No results for input(s): LIPASE, AMYLASE in the last 72 hours. Cardiac Enzymes No results for input(s): CKTOTAL, CKMB, CKMBINDEX, TROPONINI in the last 72 hours.  BNP: BNP (last 3 results) Recent Labs    10/20/19 2029  BNP 1,623.1*    ProBNP (last 3 results) No results for input(s): PROBNP in the last 8760 hours.   D-Dimer No results for input(s): DDIMER in the last 72 hours. Hemoglobin A1C No results for input(s): HGBA1C in the last 72 hours. Fasting Lipid Panel No results for input(s): CHOL, HDL, LDLCALC, TRIG, CHOLHDL, LDLDIRECT in the last 72 hours. Thyroid Function Tests No results for input(s): TSH, T4TOTAL, T3FREE, THYROIDAB in the last 72 hours.  Invalid input(s): FREET3  Other results:   Imaging    No results found.   Medications:     Scheduled Medications: . amiodarone  200 mg Oral BID  . Chlorhexidine Gluconate Cloth  6 each Topical Daily  . hydrALAZINE  75 mg Oral Q8H  .  insulin aspart  0-5 Units Subcutaneous QHS  . insulin aspart  0-9 Units Subcutaneous TID WC  . insulin glargine  12 Units Subcutaneous Daily  . isosorbide mononitrate  60 mg Oral Daily  . sodium chloride flush  10-40 mL Intracatheter Q12H    Infusions: . bivalirudin (ANGIOMAX) infusion 0.5 mg/mL (Non-ACS indications) 0.05 mg/kg/hr (11/05/19 1400)  . furosemide Stopped (11/05/19 0848)    PRN Medications: acetaminophen **OR** acetaminophen, alteplase, docusate sodium, Gerhardt's butt cream, HYDROcodone-acetaminophen, ondansetron (ZOFRAN) IV, sodium chloride flush     Assessment/Plan   1. Acute Systolic Heart Failure - BNP  >1600 on admit. ECHO this admit  EF 20-25%, pericardial effusion, mild LVH, RA/LA dilated.  RV down. Possible Tachy-mediated cardiomyopathy versus HTN.  - Started CVVHD on 11/9 due to poor response to IV diuresis and rising creatinine. CVP initially 32, now 16.  CVVH stopped 11/13.   - Weight continues to come down. Now down nearly 70 pounds on lasix 120 IV bid and metoalzone - RHC after full diuresis, - Not candidate for coronary angio with AKI - Continue hydralazine 75 mg tid Imdur 60 mg daily.  - No b-b, ACE/ARB or spiro with ARF - PVCs may be contributing to CM. Continue amiodarone 200 bid  2. Paroxysmal A fib RVR - On admit.  - Chemically converted to NSR  - Maintaining NSR on po amio. Heparin off due to + HIT screen. On bivalirudin, would eventually start him on apixaban.     3. HTN  - SBP 140s. Not on Bidil due to lack of insurance -> use hydralazine/imdur  - Would not drop SBP < 120 to preserve renal perfusion    4. AKI  - Creatinine on admit 1.76.  Creatinine worsened with diuresis. Marked volume overload. Started on CVVHD on 11/9 and stopped 11/13.  Now good UOP, creatinine up slightly to 2.1 off CVVH.   - Renal following. See discussion above - Urology saw: Bilateral staghorn calculi. No hydronephrosis so no role for percutaneous nephrostomy tubes.    5. Uncontrolled DM - Hgb A1C 11.7  - TRH managing On SS.   6. Uterine Mass - Likely fibroid per GYN. Needs outpatient follow up.   7. Anemia  - Hgb stable at 9.4  8. Thrombocytopenia - Platelets starting to rise again, 121 K today.  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  Now on bivalirudin.  Plan long-term anticoagulation with apixaban.  - No bleeding   Length of Stay: Stow, MD  11/05/2019, 2:28 PM  Advanced Heart Failure Team Pager [phone] (M-F; 7a - 4p)  Please contact [facility] for night-coverage after hours (4p -7a ) and weekends on [email or web address]

## 2019-11-05 NOTE — Progress Notes (Signed)
Physical Therapy Treatment and Discharge Patient Details Name: Adrienne Lane MRN: [id number] DOB: [birth year] Today's Date: 11/05/2019    History of Present Illness 53 yo female admitted to ED on 10/29 with fall out of minivan and R hip and LE pain. Xray negative for fracture. Pt found to be in afib with RVR, sHF (new diagnosis). PMH includes DM with A1C 11.7, HTN. Pt started on CRRT on 11/9    PT Comments    Pt tolerated treatment well, ambulating for community distances at a modI level with use of RW. PT provides HEP with use of blue theraband and demonstrates all exercises for patient, some of which performed in previous PT session. Pt expresses no concern with her current mobility level. Pt is encouraged to ambulate 2-3 times per day for the remainder of hospitalization with use of RW. Pt requires no further acute PT services. PT recommending discharge home with outpatient PT and a RW.   Follow Up Recommendations  Outpatient PT;Supervision - Intermittent     Equipment Recommendations  Rolling walker with 5" wheels    Recommendations for Other Services       Precautions / Restrictions Precautions Precautions: Fall Restrictions Weight Bearing Restrictions: No    Mobility  Bed Mobility Overal bed mobility: Independent Bed Mobility: Supine to Sit     Supine to sit: Independent        Transfers Overall transfer level: Independent Equipment used: None Transfers: Sit to/from Stand Sit to Stand: Independent            Ambulation/Gait Ambulation/Gait assistance: Modified independent (Device/Increase time) Gait Distance (Feet): 500 Feet Assistive device: Rolling walker (2 wheeled) Gait Pattern/deviations: Step-through pattern;Wide base of support Gait velocity: functional Gait velocity interpretation: 1.31 - 2.62 ft/sec, indicative of limited community ambulator General Gait Details: steady step through gait pattern, no significant balance  deviations   Stairs Stairs: (pt denies concerns for stair negotiation)           Wheelchair Mobility    Modified Rankin (Stroke Patients Only)       Balance Overall balance assessment: Modified Independent Sitting-balance support: No upper extremity supported;Feet supported Sitting balance-Leahy Scale: Normal     Standing balance support: No upper extremity supported;During functional activity Standing balance-Leahy Scale: Good                              Cognition Arousal/Alertness: Awake/alert Behavior During Therapy: WFL for tasks assessed/performed Overall Cognitive Status: Within Functional Limits for tasks assessed                                        Exercises General Exercises - Upper Extremity Shoulder Flexion: AROM;Theraband Theraband Level (Shoulder Flexion): Level 4 (Blue) Elbow Flexion: AROM;Theraband Theraband Level (Elbow Flexion): Level 4 (Blue) Elbow Extension: AROM;Theraband Theraband Level (Elbow Extension): Level 4 (Blue) General Exercises - Lower Extremity Long Arc Quad: AROM(blue theraband) Hip Flexion/Marching: AROM;Seated(blue theraband) Toe Raises: AROM Other Exercises Other Exercises: Rows, blue theraband Other Exercises: Clam shells, blue theraband Other Exercises: Bridges    General Comments General comments (skin integrity, edema, etc.): VSS, pt asymptomatic on room air      Pertinent Vitals/Pain Pain Assessment: No/denies pain    Home Living                      Prior Function  PT Goals (current goals can now be found in the care plan section) Acute Rehab PT Goals Patient Stated Goal: return home with family Progress towards PT goals: Goals met/education completed, patient discharged from PT    Frequency    Min 3X/week      PT Plan Current plan remains appropriate    Co-evaluation              AM-PAC PT "6 Clicks" Mobility   Outcome Measure  Help  needed turning from your back to your side while in a flat bed without using bedrails?: None Help needed moving from lying on your back to sitting on the side of a flat bed without using bedrails?: None Help needed moving to and from a bed to a chair (including a wheelchair)?: None Help needed standing up from a chair using your arms (e.g., wheelchair or bedside chair)?: None Help needed to walk in hospital room?: None Help needed climbing 3-5 steps with a railing? : None 6 Click Score: 24    End of Session Equipment Utilized During Treatment: (none) Activity Tolerance: Patient tolerated treatment well Patient left: in chair;with call bell/phone within reach Nurse Communication: Mobility status PT Visit Diagnosis: Other abnormalities of gait and mobility (R26.89);History of falling (Z91.81);Unsteadiness on feet (R26.81)     Time: 4210-3128 PT Time Calculation (min) (ACUTE ONLY): 23 min  Charges:  $Gait Training: 8-22 mins $Therapeutic Exercise: 8-22 mins                     Zenaida Niece, PT, DPT Acute Rehabilitation Pager: [phone]    Zenaida Niece 11/05/2019, 10:25 AM

## 2019-11-05 NOTE — Progress Notes (Signed)
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 Heparin     +HIT Ab 11/01/19    Patient Measurements: Height: 5\' 8"  (172.7 cm) Weight: 274 lb 4 oz (124.4 kg) IBW/kg (Calculated) : 63.9  Vital Signs: Temp: 98.5 F (36.9 C) (11/16 1236) Temp Source: Oral (11/16 1236) BP: 142/87 (11/16 1200) Pulse Rate: 100 (11/16 1200)  Labs: Recent Labs    11/03/19 0458 11/03/19 0736 11/04/19 0304 11/05/19 0327  HGB 9.0*  --  9.3* 9.4*  HCT 29.9*  --  30.5* 30.5*  PLT 75*  --  92* 121*  APTT  --  66* 73* 71*  CREATININE 1.81*  --  1.91* 2.15*    Estimated Creatinine Clearance: 43.1 mL/min (A) (by C-G formula based on SCr of 2.15 mg/dL (H)).   Assessment: 14 yoF with PMH DM2, HTN, presents with R hip pain after falling. Also reports several months of LE edema, orthopnea, and abdominal distention. Found to have elevated BNP and to also be in AFib with RVR.  Patient has been on IV heparin, now with positive HIT antibody.  Pharmacy consulted to transition from IV heparin to bivalirudin.  APTT remains therapeutic..  No bleeding reported.  Platelet count beginning to recover.  Goal of Therapy: APTT 50-85 sec Monitor platelets by anticoagulation protocol: Yes  Plan: Continue bivalirudin at 0.05 mg/kg/hr Daily aPTT and CBC F/U SRA - called lab today - still pending. Oral anticoagulation eventually once platelets recover.  Marguerite Olea, [facility] Clinical Pharmacist Phone [phone]  11/05/2019 1:37 PM

## 2019-11-05 NOTE — Plan of Care (Signed)

## 2019-11-06 ENCOUNTER — Ambulatory Visit: Payer: Self-pay

## 2019-11-06 LAB — GLUCOSE, CAPILLARY
Glucose-Capillary: 157 mg/dL — ABNORMAL HIGH (ref 70–99)
Glucose-Capillary: 219 mg/dL — ABNORMAL HIGH (ref 70–99)
Glucose-Capillary: 244 mg/dL — ABNORMAL HIGH (ref 70–99)
Glucose-Capillary: 187 mg/dL — ABNORMAL HIGH (ref 70–99)

## 2019-11-06 LAB — SEROTONIN RELEASE ASSAY (SRA)
SRA .2 IU/mL UFH Ser-aCnc: 3 % (ref 0–20)
SRA 100IU/mL UFH Ser-aCnc: 2 % (ref 0–20)

## 2019-11-06 LAB — CBC
HCT: 29.3 % — ABNORMAL LOW (ref 36.0–46.0)
Hemoglobin: 9.2 g/dL — ABNORMAL LOW (ref 12.0–15.0)
MCH: 25.3 pg — ABNORMAL LOW (ref 26.0–34.0)
MCHC: 31.4 g/dL (ref 30.0–36.0)
MCV: 80.5 fL (ref 80.0–100.0)
Platelets: 131 10*3/uL — ABNORMAL LOW (ref 150–400)
RBC: 3.64 MIL/uL — ABNORMAL LOW (ref 3.87–5.11)
RDW: 20.8 % — ABNORMAL HIGH (ref 11.5–15.5)
WBC: 4.9 10*3/uL (ref 4.0–10.5)
nRBC: 0 % (ref 0.0–0.2)

## 2019-11-06 LAB — MAGNESIUM: Magnesium: 1.8 mg/dL (ref 1.7–2.4)

## 2019-11-06 LAB — RENAL FUNCTION PANEL
Albumin: 3.4 g/dL — ABNORMAL LOW (ref 3.5–5.0)
Anion gap: 13 (ref 5–15)
BUN: 22 mg/dL — ABNORMAL HIGH (ref 6–20)
CO2: 27 mmol/L (ref 22–32)
Calcium: 9.2 mg/dL (ref 8.9–10.3)
Chloride: 99 mmol/L (ref 98–111)
Creatinine, Ser: 2 mg/dL — ABNORMAL HIGH (ref 0.44–1.00)
GFR calc Af Amer: 33 mL/min — ABNORMAL LOW (ref >60)
GFR calc non Af Amer: 28 mL/min — ABNORMAL LOW (ref >60)
Glucose, Bld: 157 mg/dL — ABNORMAL HIGH (ref 70–99)
Phosphorus: 4.2 mg/dL (ref 2.5–4.6)
Potassium: 3.5 mmol/L (ref 3.5–5.1)
Sodium: 139 mmol/L (ref 135–145)

## 2019-11-06 LAB — APTT: aPTT: 74 seconds — ABNORMAL HIGH (ref 24–36)

## 2019-11-06 MED ORDER — MEXILETINE HCL 200 MG PO CAPS
200.0000 mg | ORAL_CAPSULE | Freq: Two times a day (BID) | ORAL | Status: DC
  Administered 2019-11-06 – 2019-11-07 (×3): 200 mg via ORAL
  Filled 2019-11-06 (×5): qty 1

## 2019-11-06 MED ORDER — SODIUM CHLORIDE 0.9 % IV SOLN
INTRAVENOUS | Status: DC | PRN
  Administered 2019-11-06 – 2019-11-07 (×2): 250 mL via INTRAVENOUS

## 2019-11-06 MED ORDER — POTASSIUM CHLORIDE CRYS ER 20 MEQ PO TBCR
40.0000 meq | EXTENDED_RELEASE_TABLET | Freq: Two times a day (BID) | ORAL | Status: DC
  Administered 2019-11-06 (×2): 40 meq via ORAL
  Filled 2019-11-06 (×2): qty 2

## 2019-11-06 MED ORDER — MAGNESIUM SULFATE 2 GM/50ML IV SOLN
2.0000 g | Freq: Once | INTRAVENOUS | Status: AC
  Administered 2019-11-06: 2 g via INTRAVENOUS
  Filled 2019-11-06: qty 50

## 2019-11-06 MED ORDER — HYDRALAZINE HCL 50 MG PO TABS
100.0000 mg | ORAL_TABLET | Freq: Three times a day (TID) | ORAL | Status: DC
  Administered 2019-11-06 – 2019-11-12 (×18): 100 mg via ORAL
  Filled 2019-11-06 (×18): qty 2

## 2019-11-06 MED ORDER — CARVEDILOL 3.125 MG PO TABS
3.1250 mg | ORAL_TABLET | Freq: Two times a day (BID) | ORAL | Status: DC
  Administered 2019-11-06 – 2019-11-08 (×5): 3.125 mg via ORAL
  Filled 2019-11-06 (×5): qty 1

## 2019-11-06 NOTE — Progress Notes (Addendum)
Patient ID: Adrienne Lane, female   DOB: 11-01-1966, 53 y.o.   MRN: [id number]     Advanced Heart Failure Rounding Note  PCP-Cardiologist: Donato Heinz, MD   Subjective:    Transferred from [facility] to [facility] for HF management. Marked volume overload. Started on CVVHD.  CVVH stopped 11/13, high dose Lasix continued.    Weight down another 3kg overnight. Weight down 27kg total. She is up in chair and feeling well. No orthopnea or PND. Walking halls. Creatinine stable at 2.0 (was 2.15 yesterday), which is thought to be near her baseline.    Objective:   Weight Range: 121.4 kg Body mass index is 40.69 kg/m.   Vital Signs:   Temp:  [98.5 F (36.9 C)-99.3 F (37.4 C)] 98.6 F (37 C) (11/17 0337) Pulse Rate:  [61-104] 104 (11/17 0541) Resp:  [16-25] 17 (11/17 0541) BP: (131-161)/(70-100) 161/95 (11/17 0541) SpO2:  [97 %-100 %] 97 % (11/17 0541) Weight:  [121.4 kg-124.4 kg] 121.4 kg (11/17 0541) Last BM Date: 11/05/19  Weight change: Filed Weights   11/04/19 0545 11/05/19 0639 11/06/19 0541  Weight: 127.6 kg 124.4 kg 121.4 kg    Intake/Output:   Intake/Output Summary (Last 24 hours) at 11/06/2019 0623 Last data filed at 11/06/2019 0545 Gross per 24 hour  Intake 1754.9 ml  Output 4600 ml  Net -2845.1 ml      Physical Exam   CVP 15-16 General:  Sitting up in chair, appears stated age HEENT: normal Neck: supple. +JVD, no bruits. No lymphadenopathy or thryomegaly appreciated. Cor: PMI nondisplaced. irregular rate & rhythm. No rubs, gallops or murmurs. Lungs: CTAB Abdomen: obese soft, nontender, nondistended. Large ventral hernia. Good bowel sounds. Extremities: no cyanosis, clubbing, rash, 2+ edema Neuro: alert & orientedx3, cranial nerves grossly intact. moves all 4 extremities w/o difficulty. Affect pleasant   Telemetry   NSR 90-115, frequent PACs & PVCs personally reviewed.   Labs    CBC Recent Labs    11/05/19 0327 11/06/19 0245  WBC 5.8 4.9  HGB  9.4* 9.2*  HCT 30.5* 29.3*  MCV 80.7 80.5  PLT 121* 818*   Basic Metabolic Panel Recent Labs    11/05/19 0327 11/06/19 0245  NA 139 139  K 4.0 3.5  CL 98 99  CO2 28 27  GLUCOSE 172* 157*  BUN 21* 22*  CREATININE 2.15* 2.00*  CALCIUM 9.6 9.2  MG 2.1 1.8  PHOS 3.3 4.2   Liver Function Tests Recent Labs    11/05/19 0327 11/06/19 0245  ALBUMIN 3.4* 3.4*   No results for input(s): LIPASE, AMYLASE in the last 72 hours. Cardiac Enzymes No results for input(s): CKTOTAL, CKMB, CKMBINDEX, TROPONINI in the last 72 hours.  BNP: BNP (last 3 results) Recent Labs    10/20/19 2029  BNP 1,623.1*    ProBNP (last 3 results) No results for input(s): PROBNP in the last 8760 hours.   D-Dimer No results for input(s): DDIMER in the last 72 hours. Hemoglobin A1C No results for input(s): HGBA1C in the last 72 hours. Fasting Lipid Panel No results for input(s): CHOL, HDL, LDLCALC, TRIG, CHOLHDL, LDLDIRECT in the last 72 hours. Thyroid Function Tests No results for input(s): TSH, T4TOTAL, T3FREE, THYROIDAB in the last 72 hours.  Invalid input(s): FREET3  Other results:   Imaging    No results found.   Medications:     Scheduled Medications: . amiodarone  200 mg Oral BID  . Chlorhexidine Gluconate Cloth  6 each Topical Daily  . hydrALAZINE  75 mg Oral Q8H  . insulin aspart  0-5 Units Subcutaneous QHS  . insulin aspart  0-9 Units Subcutaneous TID WC  . insulin glargine  12 Units Subcutaneous Daily  . isosorbide mononitrate  60 mg Oral Daily  . sodium chloride flush  10-40 mL Intracatheter Q12H    Infusions: . bivalirudin (ANGIOMAX) infusion 0.5 mg/mL (Non-ACS indications) 0.05 mg/kg/hr (11/06/19 0336)  . furosemide Stopped (11/05/19 1810)    PRN Medications: acetaminophen **OR** acetaminophen, alteplase, docusate sodium, Gerhardt's butt cream, HYDROcodone-acetaminophen, ondansetron (ZOFRAN) IV, sodium chloride flush     Assessment/Plan   1. Acute Systolic  Heart Failure - BNP >1600 on admit. ECHO this admit  EF 20-25%, pericardial effusion, mild LVH, RA/LA dilated.  RV down. Possible Tachy-mediated cardiomyopathy versus HTN.  - Started CVVHD on 11/9 due to poor response to IV diuresis and rising creatinine. CVP initially 32, now 16.  CVVH stopped 11/13.   - Weight continues to come down. Now down >70 pounds on lasix 120 IV bid and metolazone, now on lasix alone. EDW 250 lbs per patient, still 17 lbs up if not more.  - RHC after full diuresis, - Not candidate for coronary angio with AKI - Continue hydralazine 75 mg tid, increase Imdur 90 mg daily.  - No ACE/ARB or spiro with ARF - start low dose beta blocker once diuresis complete - PVCs may be contributing to CM. Continue amiodarone 200 bid  2. Paroxysmal A fib RVR - On admit.  - Chemically converted to NSR  - Maintaining NSR on po amio. Heparin off due to + HIT screen. On bivalirudin, eventually start on apixaban    3. HTN  - SBP 160s. Not on Bidil due to lack of insurance -> use hydralazine/imdur  - increase imdur to 90 mg qd.  - start beta blocker once diuresis complete - Would not drop SBP < 120 to preserve renal perfusion  4. AKI  - Creatinine on admit 1.76.  Creatinine worsened with diuresis. Marked volume overload. Started on CVVHD on 11/9 and stopped 11/13.  Now good UOP, Cr 2 today. - Nephrology signed off, will need f/u with [facility].  - Urology saw: Bilateral staghorn calculi. No hydronephrosis so no role for percutaneous nephrostomy tubes.    5. Uncontrolled DM - Hgb A1C 11.7  - TRH managing On SSI and lantus 12   6. Uterine Mass - Likely fibroid per GYN. Needs outpatient follow up.   7. Anemia  - Hgb stable at 9.2  8. Thrombocytopenia - Platelets continuing to improve, 131  - HIT panel +.  Now on bivalirudin.  Plan long-term anticoagulation with apixaban.  - No bleeding   Length of Stay: Dixon, DO  11/06/2019, 6:23 AM  Advanced Heart  Failure Team Pager [phone] (M-F; [place])  Please contact [facility] for night-coverage after hours (4p -7a ) and weekends on [email or web address]  Patient seen and examined with the above-signed Advanced Practice Provider and/or Housestaff. I personally reviewed laboratory data, imaging studies and relevant notes. I independently examined the patient and formulated the important aspects of the plan. I have edited the note to reflect any of my changes or salient points. I have personally discussed the plan with the patient and/or family.  Continues to diurese well. Renal function stable. Still about 20 pounds form baseline weight. Will continue IV diuresis probably 2-3 more days. RHC later this week. Maintaining NSR on po amio. On bival due to HIT. (SRA still pending).  General:  Well appearing. No resp difficulty HEENT: normal Neck: supple. RIJ trialysis cath. + JVP to jaw  Carotids 2+ bilat; no bruits. No lymphadenopathy or thryomegaly appreciated. Cor: PMI nondisplaced. Regular rate & rhythm. No rubs, gallops or murmurs. Lungs: clear Abdomen: soft, nontender, nondistended. No hepatosplenomegaly. No bruits or masses. Good bowel sounds. Extremities: no cyanosis, clubbing, rash, 2+ edema Neuro: alert & orientedx3, cranial nerves grossly intact. moves all 4 extremities w/o difficulty. Affect pleasant   Plan continue IV diuresis. Continue bival. Pull trialysis cath. RHC later in the week. Possibly Thursday.  Still with frequents monomorphic PVCs (22%) will add carvedilol may need mexilitene. Will need outpatient sleep study.increase hydral to 100 tid for HTN .  [name], MD  9:41 AM

## 2019-11-06 NOTE — Plan of Care (Signed)
Progressing well, continues to diurese. Performing own ADLs, ambulated in hallways. Continuing teaching on heart healthy/heart failure lifestyle changes. Remains on angiomax gtt, no Afib.   Problem: Education: Goal: Knowledge of General Education information will improve Description: Including pain rating scale, medication(s)/side effects and non-pharmacologic comfort measures Outcome: Progressing   Problem: Health Behavior/Discharge Planning: Goal: Ability to manage health-related needs will improve Outcome: Progressing   Problem: Clinical Measurements: Goal: Ability to maintain clinical measurements within normal limits will improve Outcome: Progressing Goal: Will remain free from infection Outcome: Progressing Goal: Diagnostic test results will improve Outcome: Progressing Goal: Respiratory complications will improve Outcome: Progressing Goal: Cardiovascular complication will be avoided Outcome: Progressing   Problem: Activity: Goal: Risk for activity intolerance will decrease Outcome: Progressing   Problem: Nutrition: Goal: Adequate nutrition will be maintained Outcome: Progressing   Problem: Coping: Goal: Level of anxiety will decrease Outcome: Progressing   Problem: Elimination: Goal: Will not experience complications related to bowel motility Outcome: Progressing Goal: Will not experience complications related to urinary retention Outcome: Progressing   Problem: Pain Managment: Goal: General experience of comfort will improve Outcome: Progressing   Problem: Safety: Goal: Ability to remain free from injury will improve Outcome: Progressing   Problem: Skin Integrity: Goal: Risk for impaired skin integrity will decrease Outcome: Progressing

## 2019-11-06 NOTE — Progress Notes (Signed)
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 Heparin     +HIT Ab 11/01/19    Patient Measurements: Height: 5\' 8"  (172.7 cm) Weight: 267 lb 10.2 oz (121.4 kg) IBW/kg (Calculated) : 63.9  Vital Signs: Temp: 98.5 F (36.9 C) (11/17 1147) Temp Source: Oral (11/17 1147) BP: 153/90 (11/17 1130) Pulse Rate: 104 (11/17 0541)  Labs: Recent Labs    11/04/19 0304 11/05/19 0327 11/06/19 0245  HGB 9.3* 9.4* 9.2*  HCT 30.5* 30.5* 29.3*  PLT 92* 121* 131*  APTT 73* 71* 74*  CREATININE 1.91* 2.15* 2.00*    Estimated Creatinine Clearance: 45.7 mL/min (A) (by C-G formula based on SCr of 2 mg/dL (H)).   Assessment: 41 yoF with PMH DM2, HTN, presents with R hip pain after falling. Also reports several months of LE edema, orthopnea, and abdominal distention. Found to have elevated BNP and to also be in AFib with RVR.  Patient has been on IV heparin, now with positive HIT antibody.  Pharmacy consulted to transition from IV heparin to bivalirudin.  APTT remains therapeutic.  No bleeding reported.  Platelet count continues to recover. SRA negative for HIT.   Goal of Therapy: APTT 50-85 sec Monitor platelets by anticoagulation protocol: Yes  Plan: Continue bivalirudin at 0.05 mg/kg/hr Daily aPTT and CBC F/u transition to oral anticoagulation  Richardine Service, PharmD PGY1 Pharmacy Resident Phone: [phone] 11/06/2019  12:34 PM  Please check [email or web address] for unit-specific pharmacy phone numbers.

## 2019-11-06 NOTE — Progress Notes (Signed)
CARDIAC REHAB PHASE I   PRE:  Rate/Rhythm: 93 SR bigeminy PVCs  BP:  Supine:   Sitting: 151/73  Standing:    SaO2: 99%RA  MODE:  Ambulation: 370 ft   POST:  Rate/Rhythm: 113 ST PVCs                           Sat 97%RA 1424-1512 Pt walked 370 ft on RA with steady gait and tolerated well. Went to bathroom and then to recliner. Breathing better per pt. Gave CHF booklet and discussed signs/symptoms of when to call MD, daily weights, 2000 mg sodium restriction, and 2 LFR. Understanding voiced.  Gave low sodium diets.      Graylon Good, RN BSN  11/06/2019 3:07 PM

## 2019-11-07 ENCOUNTER — Inpatient Hospital Stay (HOSPITAL_COMMUNITY): Payer: Self-pay

## 2019-11-07 LAB — CBC
HCT: 28.4 % — ABNORMAL LOW (ref 36.0–46.0)
Hemoglobin: 9.1 g/dL — ABNORMAL LOW (ref 12.0–15.0)
MCH: 25.5 pg — ABNORMAL LOW (ref 26.0–34.0)
MCHC: 32 g/dL (ref 30.0–36.0)
MCV: 79.6 fL — ABNORMAL LOW (ref 80.0–100.0)
Platelets: 139 10*3/uL — ABNORMAL LOW (ref 150–400)
RBC: 3.57 MIL/uL — ABNORMAL LOW (ref 3.87–5.11)
RDW: 20.4 % — ABNORMAL HIGH (ref 11.5–15.5)
WBC: 4.7 10*3/uL (ref 4.0–10.5)
nRBC: 0 % (ref 0.0–0.2)

## 2019-11-07 LAB — RENAL FUNCTION PANEL
Albumin: 3.2 g/dL — ABNORMAL LOW (ref 3.5–5.0)
Anion gap: 12 (ref 5–15)
BUN: 26 mg/dL — ABNORMAL HIGH (ref 6–20)
CO2: 28 mmol/L (ref 22–32)
Calcium: 9.2 mg/dL (ref 8.9–10.3)
Chloride: 100 mmol/L (ref 98–111)
Creatinine, Ser: 2.17 mg/dL — ABNORMAL HIGH (ref 0.44–1.00)
GFR calc Af Amer: 30 mL/min — ABNORMAL LOW (ref >60)
GFR calc non Af Amer: 26 mL/min — ABNORMAL LOW (ref >60)
Glucose, Bld: 202 mg/dL — ABNORMAL HIGH (ref 70–99)
Phosphorus: 4.3 mg/dL (ref 2.5–4.6)
Potassium: 3.7 mmol/L (ref 3.5–5.1)
Sodium: 140 mmol/L (ref 135–145)

## 2019-11-07 LAB — LACTIC ACID, PLASMA: Lactic Acid, Venous: 1 mmol/L (ref 0.5–1.9)

## 2019-11-07 LAB — MRSA PCR SCREENING: MRSA by PCR: NEGATIVE

## 2019-11-07 LAB — MAGNESIUM: Magnesium: 2 mg/dL (ref 1.7–2.4)

## 2019-11-07 LAB — GLUCOSE, CAPILLARY
Glucose-Capillary: 161 mg/dL — ABNORMAL HIGH (ref 70–99)
Glucose-Capillary: 212 mg/dL — ABNORMAL HIGH (ref 70–99)
Glucose-Capillary: 194 mg/dL — ABNORMAL HIGH (ref 70–99)
Glucose-Capillary: 194 mg/dL — ABNORMAL HIGH (ref 70–99)

## 2019-11-07 LAB — APTT: aPTT: 68 seconds — ABNORMAL HIGH (ref 24–36)

## 2019-11-07 MED ORDER — POTASSIUM CHLORIDE CRYS ER 20 MEQ PO TBCR
40.0000 meq | EXTENDED_RELEASE_TABLET | Freq: Two times a day (BID) | ORAL | Status: AC
  Administered 2019-11-07 (×2): 40 meq via ORAL
  Filled 2019-11-07 (×2): qty 2

## 2019-11-07 MED ORDER — METOLAZONE 5 MG PO TABS
5.0000 mg | ORAL_TABLET | Freq: Once | ORAL | Status: AC
  Administered 2019-11-07: 5 mg via ORAL
  Filled 2019-11-07: qty 1

## 2019-11-07 NOTE — Progress Notes (Addendum)
Patient ID: Adrienne Lane, female   DOB: [birth year], 53 y.o.   MRN: [id number]     Advanced Heart Failure Rounding Note  PCP-Cardiologist: Donato Heinz, MD   Subjective:    Transferred from [facility] to [facility] for HF management. Marked volume overload. Started on CVVHD.  CVVH stopped 11/13, high dose Lasix at 120mg  bid continued.    Feeling well today but having increasing pain from umbilical hernia. Down 1kg overnight. Weight down 28kg total. She is up in chair and feeling well. No orthopnea or PND. Walking halls. Creatinine at baseline 2.17.   Started on mexilitene for high PVC burden on 11/17    Objective:   Weight Range: 121.4 kg Body mass index is 40.69 kg/m.   Vital Signs:   Temp:  [98.1 F (36.7 C)-98.8 F (37.1 C)] 98.3 F (36.8 C) (11/18 0300) Pulse Rate:  [42-98] 42 (11/18 0300) Resp:  [14-25] 19 (11/18 0300) BP: (128-159)/(63-110) 128/69 (11/18 0300) SpO2:  [99 %-100 %] 99 % (11/18 0300) Last BM Date: 11/05/19  Weight change: Filed Weights   11/04/19 0545 11/05/19 0639 11/06/19 0541  Weight: 127.6 kg 124.4 kg 121.4 kg    Intake/Output:   Intake/Output Summary (Last 24 hours) at 11/07/2019 0603 Last data filed at 11/07/2019 0400 Gross per 24 hour  Intake 1371.84 ml  Output 2875 ml  Net -1503.16 ml      Physical Exam   General:  Sitting up in chair, obese HEENT: Normal anicteric Neck: supple. +JVD, improving, no bruits. No lymphadenopathy or thryomegaly appreciated. Cor: PMI nondisplaced. irregular rate & rhythm. No rubs, gallops or murmurs. Lungs: CTAB,  No wheeze Abdomen: obese soft, nontender, nondistended. Large ventral hernia, indurated. Good bowel sounds. Extremities: no cyanosis, clubbing, rash, 1-2+ edema Neuro: alert & oriented x 3, cranial nerves grossly intact. moves all 4 extremities w/o difficulty. Affect pleasant    Telemetry   NSR 90-115, frequent PVCs, episodes of bigeminy, personally reviewed.   Labs    CBC Recent Labs    11/06/19 0245 11/07/19 0224  WBC 4.9 4.7  HGB 9.2* 9.1*  HCT 29.3* 28.4*  MCV 80.5 79.6*  PLT 131* 891*   Basic Metabolic Panel Recent Labs    11/06/19 0245 11/07/19 0224  NA 139 140  K 3.5 3.7  CL 99 100  CO2 27 28  GLUCOSE 157* 202*  BUN 22* 26*  CREATININE 2.00* 2.17*  CALCIUM 9.2 9.2  MG 1.8 2.0  PHOS 4.2 4.3   Liver Function Tests Recent Labs    11/06/19 0245 11/07/19 0224  ALBUMIN 3.4* 3.2*   No results for input(s): LIPASE, AMYLASE in the last 72 hours. Cardiac Enzymes No results for input(s): CKTOTAL, CKMB, CKMBINDEX, TROPONINI in the last 72 hours.  BNP: BNP (last 3 results) Recent Labs    10/20/19 2029  BNP 1,623.1*    ProBNP (last 3 results) No results for input(s): PROBNP in the last 8760 hours.   D-Dimer No results for input(s): DDIMER in the last 72 hours. Hemoglobin A1C No results for input(s): HGBA1C in the last 72 hours. Fasting Lipid Panel No results for input(s): CHOL, HDL, LDLCALC, TRIG, CHOLHDL, LDLDIRECT in the last 72 hours. Thyroid Function Tests No results for input(s): TSH, T4TOTAL, T3FREE, THYROIDAB in the last 72 hours.  Invalid input(s): FREET3  Other results:   Imaging    No results found.   Medications:     Scheduled Medications: . carvedilol  3.125 mg Oral BID WC  . Chlorhexidine Gluconate Cloth  6 each Topical Daily  . hydrALAZINE  100 mg Oral Q8H  . insulin aspart  0-5 Units Subcutaneous QHS  . insulin aspart  0-9 Units Subcutaneous TID WC  . insulin glargine  12 Units Subcutaneous Daily  . isosorbide mononitrate  60 mg Oral Daily  . mexiletine  200 mg Oral Q12H  . potassium chloride  40 mEq Oral BID  . sodium chloride flush  10-40 mL Intracatheter Q12H    Infusions: . sodium chloride 250 mL (11/06/19 0810)  . bivalirudin (ANGIOMAX) infusion 0.5 mg/mL (Non-ACS indications) 0.05 mg/kg/hr (11/06/19 1800)  . furosemide 120 mg (11/06/19 1843)    PRN Medications: sodium chloride, acetaminophen  **OR** acetaminophen, alteplase, docusate sodium, Gerhardt's butt cream, HYDROcodone-acetaminophen, ondansetron (ZOFRAN) IV, sodium chloride flush     Assessment/Plan   1. Acute Systolic Heart Failure - BNP >1600 on admit. ECHO this admit  EF 20-25%, pericardial effusion, mild LVH, RA/LA dilated.  RV down. Possible Tachy-mediated cardiomyopathy versus HTN.  - Started CVVHD on 11/9 due to poor response to IV diuresis and rising creatinine. CVP initially 32, last 16 11/17.  CVVH stopped 11/13.   - Weight continues to come down. Now down >70 pounds on lasix 120 IV bid and metolazone, now on lasix alone. EDW 250 lbs per patient, still ~17 lbs up.  - RHC after full diuresis - Not candidate for coronary angio with AKI - Hydral increased 100 mg tid, cont imdur 60 mg qd, bp better controlled today  - No ACE/ARB or spiro with ARF - PVCs may be contributing to CM. coreg 3.125 mg bid and mexiletine 200 mg bid started yesterday. Now having episodes of bigeminy as well with ~39% PVcs  2. Paroxysmal A fib RVR - On admit.  - Chemically converted to NSR  - Maintaining NSR on po amio. Heparin off due to + HIT screen. - SRA negative, platelets improving, switch bivalirudin to apixaban today   3. HTN  - SBP 160s. Not on Bidil due to lack of insurance -> use hydralazine/imdur  - hydral increased to 100 yesterday - low dose beta blocker started yesterday - Would not drop SBP < 120 to preserve renal perfusion  4. AKI  - Creatinine on admit 1.76.  Creatinine worsened with diuresis. Marked volume overload. Started on CVVHD on 11/9 and stopped 11/13.  Now good UOP, Cr 2 today. - Nephrology signed off, thought to be at baseline ~2, will need f/u with [facility].  - Urology saw: Bilateral staghorn calculi. No hydronephrosis so no role for percutaneous nephrostomy tubes.    5. Uncontrolled DM - Hgb A1C 11.7  - TRH managing On SSI and lantus 12   6. Uterine Mass - Likely fibroid per GYN. Needs  outpatient follow up.   7. Anemia  - Hgb stable at 9.1  8. Thrombocytopenia - Platelets continuing to improve, 131>139 today - HIT panel +, Serotonin release assay negative, now on bivalirudin.  Plan long-term anticoagulation with apixaban.  - No bleeding   Length of Stay: [place], DO  11/07/2019, 6:03 AM  Advanced Heart Failure Team Pager [phone] (M-F; [place])  Please contact [facility] for night-coverage after hours (4p -7a ) and weekends on [email or web address]  Patient seen and examined with the above-signed Advanced Practice Provider and/or Housestaff. I personally reviewed laboratory data, imaging studies and relevant notes. I independently examined the patient and formulated the important aspects of the plan. I have edited the note to reflect any of  my changes or salient points. I have personally discussed the plan with the patient and/or family.  Continues to diurese well. Weight down 74 pounds. Nearing baseline weight of 250 pounds. Will continue IV lasix. Add metolazone 5mg  today. Will plan RHC tomorrow.   Remains in NSR with high PVC burden. Amio switched to mexilitene but has only had one dose so far. I am concerned she may have PVC-induced CM (vs HTN). No coronary angio in setting of Cr . 2.0.   Platelets improving on bival. SRA negative. Can switch to Eliquis after cath tomorrow.   Exam with increasing pain and tenderness surrounding umbilical hernia. Concern for incarceration. No fevers or leukocytosis. Repeat CT (no contrast). Check lactate. Call [facility].   Increase carvedilol to 6.25 bid.   [name], MD  8:34 AM

## 2019-11-07 NOTE — Progress Notes (Signed)
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   Assessment: 4 yoF with PMH DM2, HTN, presents with R hip pain after falling. Also reports several months of LE edema, orthopnea, and abdominal distention. Found to have elevated BNP and to also be in AFib with RVR.  Patient has been on IV heparin, now with positive HIT antibody.  Pharmacy consulted to transition from IV heparin to bivalirudin.  APTT remains therapeutic.  No bleeding reported.  Platelet count continues to recover. SRA negative for HIT.   Goal of Therapy: APTT 50-85 sec Monitor platelets by anticoagulation protocol: Yes  Plan: Continue bivalirudin at 0.05 mg/kg/hr Daily aPTT and CBC Plan to transition to Eliquis after cath tomorrow  Richardine Service, PharmD PGY1 Pharmacy Resident Phone: [phone] 11/07/2019  6:30 AM  Please check [email or web address] for unit-specific pharmacy phone numbers.

## 2019-11-07 NOTE — Progress Notes (Signed)
Patient ID: Adrienne Lane, female   DOB: January 07, 1966, 53 y.o.   MRN: [id number]   CT scan shows hernia only contains fat and fluid.  No bowel involved. No need for emergent surgery.  Will resume her diet. Will consider elective surgery in the future depending on her cardiac status. I discussed this with her and she agrees with the plan

## 2019-11-07 NOTE — Progress Notes (Signed)
No need for 2nd PIV at this time. Medications compatible. Notified Research scientist (medical). Fran Lowes, RN VAST

## 2019-11-07 NOTE — Consult Note (Signed)
Reason for Consult:incarcerated hernia Referring Physician: Dr. Pierre Bali  Adrienne Lane is an 53 y.o. female.  HPI: This is a 53 year old female I was asked to consult on urgently for incarcerated umbilical hernia.  The patient reports she has had the hernia for at least a year.  It has been evaluated in the past at [facility] in [place].  Over the past 24 hours, she is reported increasing pain at her umbilicus.  She had one episode of emesis 2 days ago but denies any nausea today.  She reports sharp, focal, moderate to severe pain at the umbilicus.  Again, she currently has no obstructive symptoms.  She is here for management of heart failure and volume overload.  Past Medical History:  Diagnosis Date  . Diabetes mellitus without complication ([place])   . Hypertension     Past Surgical History:  Procedure Laterality Date  . CHOLECYSTECTOMY      Family History  Problem Relation Age of Onset  . Atrial fibrillation Mother     Social History:  reports that she has never smoked. She has never used smokeless tobacco. She reports that she does not drink alcohol or use drugs.  Allergies: No Known Allergies  Medications: I have reviewed the patient's current medications.  Results for orders placed or performed during the hospital encounter of 10/20/19 (from the past 48 hour(s))  Glucose, capillary     Status: Abnormal   Collection Time: 11/05/19 12:33 PM  Result Value Ref Range   Glucose-Capillary 257 (H) 70 - 99 mg/dL  Glucose, capillary     Status: Abnormal   Collection Time: 11/05/19  3:45 PM  Result Value Ref Range   Glucose-Capillary 140 (H) 70 - 99 mg/dL  Glucose, capillary     Status: Abnormal   Collection Time: 11/05/19  9:30 PM  Result Value Ref Range   Glucose-Capillary 219 (H) 70 - 99 mg/dL  CBC     Status: Abnormal   Collection Time: 11/06/19  2:45 AM  Result Value Ref Range   WBC 4.9 4.0 - 10.5 K/uL   RBC 3.64 (L) 3.87 - 5.11 MIL/uL   Hemoglobin 9.2  (L) 12.0 - 15.0 g/dL   HCT 29.3 (L) 36.0 - 46.0 %   MCV 80.5 80.0 - 100.0 fL   MCH 25.3 (L) 26.0 - 34.0 pg   MCHC 31.4 30.0 - 36.0 g/dL   RDW 20.8 (H) 11.5 - 15.5 %   Platelets 131 (L) 150 - 400 K/uL   nRBC 0.0 0.0 - 0.2 %    Comment: Performed at [facility], [address], [place], [place] [address]  Magnesium     Status: None   Collection Time: 11/06/19  2:45 AM  Result Value Ref Range   Magnesium 1.8 1.7 - 2.4 mg/dL    Comment: Performed at [facility] [address], [place], [place] [address]  APTT     Status: Abnormal   Collection Time: 11/06/19  2:45 AM  Result Value Ref Range   aPTT 74 (H) 24 - 36 seconds    Comment:        IF BASELINE aPTT IS ELEVATED, SUGGEST PATIENT RISK ASSESSMENT BE USED TO DETERMINE APPROPRIATE ANTICOAGULANT THERAPY. Performed at [facility], [place] [address], [place], [place] [address]   Renal function panel     Status: Abnormal   Collection Time: 11/06/19  2:45 AM  Result Value Ref Range   Sodium 139 135 - 145 mmol/L  Potassium 3.5 3.5 - 5.1 mmol/L   Chloride 99 98 - 111 mmol/L   CO2 27 22 - 32 mmol/L   Glucose, Bld 157 (H) 70 - 99 mg/dL   BUN 22 (H) 6 - 20 mg/dL   Creatinine, Ser 2.00 (H) 0.44 - 1.00 mg/dL   Calcium 9.2 8.9 - 10.3 mg/dL   Phosphorus 4.2 2.5 - 4.6 mg/dL   Albumin 3.4 (L) 3.5 - 5.0 g/dL   GFR calc non Af Amer 28 (L) >60 mL/min   GFR calc Af Amer 33 (L) >60 mL/min   Anion gap 13 5 - 15    Comment: Performed at [facility] [address], [place], [place] [address]  Glucose, capillary     Status: Abnormal   Collection Time: 11/06/19  6:54 AM  Result Value Ref Range   Glucose-Capillary 157 (H) 70 - 99 mg/dL  Glucose, capillary     Status: Abnormal   Collection Time: 11/06/19 11:45 AM  Result Value Ref Range   Glucose-Capillary 219 (H) 70 - 99 mg/dL  Glucose, capillary     Status: Abnormal   Collection Time: 11/06/19  3:49 PM  Result Value Ref Range   Glucose-Capillary 244 (H) 70 - 99  mg/dL  Glucose, capillary     Status: Abnormal   Collection Time: 11/06/19  9:16 PM  Result Value Ref Range   Glucose-Capillary 187 (H) 70 - 99 mg/dL  CBC     Status: Abnormal   Collection Time: 11/07/19  2:24 AM  Result Value Ref Range   WBC 4.7 4.0 - 10.5 K/uL   RBC 3.57 (L) 3.87 - 5.11 MIL/uL   Hemoglobin 9.1 (L) 12.0 - 15.0 g/dL   HCT 28.4 (L) 36.0 - 46.0 %   MCV 79.6 (L) 80.0 - 100.0 fL   MCH 25.5 (L) 26.0 - 34.0 pg   MCHC 32.0 30.0 - 36.0 g/dL   RDW 20.4 (H) 11.5 - 15.5 %   Platelets 139 (L) 150 - 400 K/uL    Comment: REPEATED TO VERIFY   nRBC 0.0 0.0 - 0.2 %    Comment: Performed at [facility], [place] [address], [place], [place] [address]  Renal function panel (daily at 0500)     Status: Abnormal   Collection Time: 11/07/19  2:24 AM  Result Value Ref Range   Sodium 140 135 - 145 mmol/L   Potassium 3.7 3.5 - 5.1 mmol/L   Chloride 100 98 - 111 mmol/L   CO2 28 22 - 32 mmol/L   Glucose, Bld 202 (H) 70 - 99 mg/dL   BUN 26 (H) 6 - 20 mg/dL   Creatinine, Ser 2.17 (H) 0.44 - 1.00 mg/dL   Calcium 9.2 8.9 - 10.3 mg/dL   Phosphorus 4.3 2.5 - 4.6 mg/dL   Albumin 3.2 (L) 3.5 - 5.0 g/dL   GFR calc non Af Amer 26 (L) >60 mL/min   GFR calc Af Amer 30 (L) >60 mL/min   Anion gap 12 5 - 15    Comment: Performed at [facility] [address], [place], [place] [address]  Magnesium     Status: None   Collection Time: 11/07/19  2:24 AM  Result Value Ref Range   Magnesium 2.0 1.7 - 2.4 mg/dL    Comment: Performed at [facility], [place] [address] [place], [place] [address]  APTT     Status: Abnormal   Collection Time: 11/07/19  2:24 AM  Result  Value Ref Range   aPTT 68 (H) 24 - 36 seconds    Comment:        IF BASELINE aPTT IS ELEVATED, SUGGEST PATIENT RISK ASSESSMENT BE USED TO DETERMINE APPROPRIATE ANTICOAGULANT THERAPY. Performed at [facility], [place] [address] [place], [place] [address]   Glucose, capillary     Status: Abnormal   Collection Time:  11/07/19  6:32 AM  Result Value Ref Range   Glucose-Capillary 161 (H) 70 - 99 mg/dL    No results found.  Review of Systems  All other systems reviewed and are negative.  Blood pressure 126/73, pulse (!) 44, temperature 98 F (36.7 C), temperature source Oral, resp. rate (!) 22, height 5\' 8"  (1.727 m), weight 120.4 kg, last menstrual period 10/20/2019, SpO2 97 %. Physical Exam  Constitutional: She is oriented to person, place, and time. She appears well-developed and well-nourished. No distress.  HENT:  Head: Normocephalic and atraumatic.  Right Ear: External ear normal.  Left Ear: External ear normal.  Nose: Nose normal.  Mouth/Throat: No oropharyngeal exudate.  Eyes: Pupils are equal, round, and reactive to light. No scleral icterus.  Neck: Normal range of motion. Neck supple. No tracheal deviation present.  Cardiovascular: Intact distal pulses.  Irregular rate and rhythm  Respiratory: Effort normal and breath sounds normal. No respiratory distress.  GI: Soft.  Her abdomen is obese.  She has a chronically incarcerated hernia above the umbilicus.  There are chronic skin changes.  She has tenderness at the hernia site.  The rest of her abdomen is soft and nontender.  Musculoskeletal: Normal range of motion.        General: Edema present. No tenderness.  Neurological: She is alert and oriented to person, place, and time.  Skin: Skin is warm. No rash noted. She is not diaphoretic. No erythema.  Psychiatric: Her behavior is normal. Judgment normal.    Assessment/Plan:  Incarcerated umbilical hernia  I suspect this is from her laparoscopic incision from her cholecystectomy.  I suspect it probably contains incarcerated omentum.  An urgent CAT scan of the abdomen and pelvis is been ordered.  If this does contain bowel, I would need to proceed to the operating room for an urgent repair.  If it contains only omentum, we would try to manage this conservatively and plan elective surgery  when she is more stable from a cardiopulmonary standpoint.  I will see her back after the CT scan.  I explained to her and she is in agreement with plan.  Coralie Keens MD 11/07/2019, 9:32 AM

## 2019-11-07 NOTE — Progress Notes (Signed)
1330 Holding ambulation until CT results available. Will continue to follow . Graylon Good RN BSN 11/07/2019 1:28 PM

## 2019-11-08 LAB — RENAL FUNCTION PANEL
Albumin: 3.4 g/dL — ABNORMAL LOW (ref 3.5–5.0)
Anion gap: 13 (ref 5–15)
BUN: 32 mg/dL — ABNORMAL HIGH (ref 6–20)
CO2: 26 mmol/L (ref 22–32)
Calcium: 9.2 mg/dL (ref 8.9–10.3)
Chloride: 98 mmol/L (ref 98–111)
Creatinine, Ser: 2.5 mg/dL — ABNORMAL HIGH (ref 0.44–1.00)
GFR calc Af Amer: 25 mL/min — ABNORMAL LOW (ref >60)
GFR calc non Af Amer: 22 mL/min — ABNORMAL LOW (ref >60)
Glucose, Bld: 261 mg/dL — ABNORMAL HIGH (ref 70–99)
Phosphorus: 4.5 mg/dL (ref 2.5–4.6)
Potassium: 4 mmol/L (ref 3.5–5.1)
Sodium: 137 mmol/L (ref 135–145)

## 2019-11-08 LAB — CBC
HCT: 29.4 % — ABNORMAL LOW (ref 36.0–46.0)
Hemoglobin: 9.4 g/dL — ABNORMAL LOW (ref 12.0–15.0)
MCH: 25.5 pg — ABNORMAL LOW (ref 26.0–34.0)
MCHC: 32 g/dL (ref 30.0–36.0)
MCV: 79.7 fL — ABNORMAL LOW (ref 80.0–100.0)
Platelets: 174 10*3/uL (ref 150–400)
RBC: 3.69 MIL/uL — ABNORMAL LOW (ref 3.87–5.11)
RDW: 20.5 % — ABNORMAL HIGH (ref 11.5–15.5)
WBC: 4.7 10*3/uL (ref 4.0–10.5)
nRBC: 0 % (ref 0.0–0.2)

## 2019-11-08 LAB — GLUCOSE, CAPILLARY
Glucose-Capillary: 179 mg/dL — ABNORMAL HIGH (ref 70–99)
Glucose-Capillary: 148 mg/dL — ABNORMAL HIGH (ref 70–99)
Glucose-Capillary: 157 mg/dL — ABNORMAL HIGH (ref 70–99)
Glucose-Capillary: 167 mg/dL — ABNORMAL HIGH (ref 70–99)
Glucose-Capillary: 177 mg/dL — ABNORMAL HIGH (ref 70–99)

## 2019-11-08 LAB — APTT: aPTT: 66 seconds — ABNORMAL HIGH (ref 24–36)

## 2019-11-08 LAB — MAGNESIUM: Magnesium: 1.9 mg/dL (ref 1.7–2.4)

## 2019-11-08 MED ORDER — POTASSIUM CHLORIDE CRYS ER 20 MEQ PO TBCR
40.0000 meq | EXTENDED_RELEASE_TABLET | Freq: Once | ORAL | Status: AC
  Administered 2019-11-08: 11:00:00 40 meq via ORAL
  Filled 2019-11-08: qty 2

## 2019-11-08 MED ORDER — SODIUM CHLORIDE 0.9% FLUSH: 3.0000 mL | INTRAVENOUS | Status: DC | PRN

## 2019-11-08 MED ORDER — SODIUM CHLORIDE 0.9% FLUSH
3.0000 mL | Freq: Two times a day (BID) | INTRAVENOUS | Status: DC
  Administered 2019-11-08 – 2019-11-12 (×3): 3 mL via INTRAVENOUS

## 2019-11-08 MED ORDER — SODIUM CHLORIDE 0.9 % IV SOLN: 250.0000 mL | INTRAVENOUS | Status: DC | PRN

## 2019-11-08 MED ORDER — SODIUM CHLORIDE 0.9 % IV SOLN
INTRAVENOUS | Status: DC
  Administered 2019-11-09: 06:00:00 via INTRAVENOUS

## 2019-11-08 MED ORDER — ASPIRIN 81 MG PO CHEW
81.0000 mg | CHEWABLE_TABLET | ORAL | Status: AC
  Administered 2019-11-09: 81 mg via ORAL
  Filled 2019-11-08: qty 1

## 2019-11-08 MED ORDER — MAGNESIUM SULFATE 2 GM/50ML IV SOLN
2.0000 g | Freq: Once | INTRAVENOUS | Status: AC
  Administered 2019-11-08: 2 g via INTRAVENOUS
  Filled 2019-11-08: qty 50

## 2019-11-08 MED ORDER — METOLAZONE 5 MG PO TABS
5.0000 mg | ORAL_TABLET | Freq: Once | ORAL | Status: AC
  Administered 2019-11-08: 5 mg via ORAL
  Filled 2019-11-08: qty 1

## 2019-11-08 MED ORDER — MEXILETINE HCL 150 MG PO CAPS
300.0000 mg | ORAL_CAPSULE | Freq: Two times a day (BID) | ORAL | Status: DC
  Administered 2019-11-08 – 2019-11-12 (×9): 300 mg via ORAL
  Filled 2019-11-08 (×9): qty 2

## 2019-11-08 NOTE — H&P (View-Only) (Signed)
Patient ID: Adrienne Lane, female   DOB: 1966-10-21, 53 y.o.   MRN: [id number]     Advanced Heart Failure Rounding Note  PCP-Cardiologist: Donato Heinz, MD   Subjective:    Transferred from [facility] to [facility] for HF management. Marked volume overload. Started on CVVHD.  CVVH stopped 11/13, high dose Lasix at 120mg  bid continued, metolazone added yesterday. Slight Cr/BUN bump today.    No SOB or orthopnea, ambulating. Some umbilical hernia pain but only at night, agrees with plan for outpatient elective surgery. Down 1kg overnight. Weight down 29kg total. No orthopnea or PND. Working with PT.  Started on mexilitene for high PVC burden on 11/17. PVC burden 25-30%.    Objective:   Weight Range: 119.7 kg Body mass index is 40.12 kg/m.   Vital Signs:   Temp:  [97.4 F (36.3 C)-98.1 F (36.7 C)] 98.1 F (36.7 C) (11/19 0341) Pulse Rate:  [44-94] 80 (11/19 0341) Resp:  [15-26] 20 (11/19 0341) BP: (126-154)/(72-92) 139/72 (11/19 0341) SpO2:  [97 %-100 %] 100 % (11/19 0341) Weight:  [119.7 kg] 119.7 kg (11/19 0341) Last BM Date: 11/06/19  Weight change: Filed Weights   11/06/19 0541 11/07/19 0500 11/08/19 0341  Weight: 121.4 kg 120.4 kg 119.7 kg    Intake/Output:   Intake/Output Summary (Last 24 hours) at 11/08/2019 0604 Last data filed at 11/08/2019 0422 Gross per 24 hour  Intake 1067.68 ml  Output 2950 ml  Net -1882.32 ml      Physical Exam   General:  Supine in bed, NAD HEENT: Normal anicteric  Neck: supple. Mild JVD, improving, no bruits. No lymphadenopathy or thryomegaly appreciated. Cor: PMI nondisplaced. irregular rate & rhythm. No r/m/g Lungs: CTAB,  No wheeze Abdomen: obese soft, nondistended. Indurated umbilical hernia, slightly tender. Good bowel sounds. Extremities: no cyanosis, clubbing, rash, tr-1+ edema Neuro: alert & oriented x 3, cranial nerves grossly intact. moves all 4 extremities w/o difficulty. Affect pleasant   Telemetry   NSR 88, PVC  burden 25-30%, episodes of bigeminy, personally reviewed.   Labs    CBC Recent Labs    11/07/19 0224 11/08/19 0215  WBC 4.7 4.7  HGB 9.1* 9.4*  HCT 28.4* 29.4*  MCV 79.6* 79.7*  PLT 139* 517   Basic Metabolic Panel Recent Labs    11/07/19 0224 11/08/19 0215  NA 140 137  K 3.7 4.0  CL 100 98  CO2 28 26  GLUCOSE 202* 261*  BUN 26* 32*  CREATININE 2.17* 2.50*  CALCIUM 9.2 9.2  MG 2.0 1.9  PHOS 4.3 4.5   Liver Function Tests Recent Labs    11/07/19 0224 11/08/19 0215  ALBUMIN 3.2* 3.4*   No results for input(s): LIPASE, AMYLASE in the last 72 hours. Cardiac Enzymes No results for input(s): CKTOTAL, CKMB, CKMBINDEX, TROPONINI in the last 72 hours.  BNP: BNP (last 3 results) Recent Labs    10/20/19 2029  BNP 1,623.1*    ProBNP (last 3 results) No results for input(s): PROBNP in the last 8760 hours.   D-Dimer No results for input(s): DDIMER in the last 72 hours. Hemoglobin A1C No results for input(s): HGBA1C in the last 72 hours. Fasting Lipid Panel No results for input(s): CHOL, HDL, LDLCALC, TRIG, CHOLHDL, LDLDIRECT in the last 72 hours. Thyroid Function Tests No results for input(s): TSH, T4TOTAL, T3FREE, THYROIDAB in the last 72 hours.  Invalid input(s): FREET3  Other results:   Imaging    Ct Abdomen Pelvis Wo Contrast  Result Date: 11/07/2019 CLINICAL  DATA:  Evaluate for incarcerated umbilical hernia. EXAM: CT ABDOMEN AND PELVIS WITHOUT CONTRAST TECHNIQUE: Multidetector CT imaging of the abdomen and pelvis was performed following the standard protocol without IV contrast. COMPARISON:  10/23/2019 FINDINGS: Lower chest: Small calcification along the medial right lung base. 3 mm nodule along the left major fissure on sequence 5, image 7. Slightly mosaic attenuation in the lower lungs could represent air trapping versus areas of atelectasis. No significant pleural effusions. Hepatobiliary: Again noted is a small amount of perihepatic ascites.  Perihepatic ascites has slightly decreased from the comparison examination. Gallbladder has been surgically removed. Pancreas: Unremarkable. No pancreatic ductal dilatation or surrounding inflammatory changes. Spleen: Normal in size without focal abnormality. Adrenals/Urinary Tract: Adrenal glands are poorly characterized. Large bilateral staghorn calculi appear to be involving most of the renal calices. Evidence for cortical scarring or atrophy along the left kidney lower pole. Normal appearance of the urinary bladder. Stomach/Bowel: There is oral contrast on this examination. Oral contrast in the stomach and small bowel. There is no contrast in the colon but there is no evidence for bowel extending into the umbilical hernia. No evidence for bowel obstruction or focal bowel inflammation. Vascular/Lymphatic: Atherosclerotic calcifications in the aorta and iliac arteries. Negative for an aortic aneurysm. No significant lymph node enlargement but limited evaluation on this noncontrast examination. Reproductive: Again noted is a markedly enlarged uterus measuring 18.2 x 14.0 x 16.9 cm. Uterus is heterogeneous and difficult to evaluate for fibroids or a uterine mass. No gross adnexal lesions. Other: Again noted is a umbilical or periumbilical hernia containing fat and fluid. This ventral hernia is large measuring up to 7.1 cm in the craniocaudal dimension. Diffuse subcutaneous edema in the anterior abdominal soft tissues. Decreased subcutaneous edema appeared to the previous examination. Negative for free intraperitoneal air. Musculoskeletal: Disc space narrowing and endplate changes at O2-H4. IMPRESSION: 1. Large umbilical hernia containing fat and fluid. Fluid within the hernia could represent ascites but cannot exclude incarceration or inflammation involving the umbilical hernia. No evidence for bowel within this umbilical hernia. 2. Large bilateral renal staghorn calculi. 3. Markedly enlarged uterus. Differential  would include uterine fibroids versus endometrial mass. Consider further characterization with a pelvic MRI as was mentioned on the ultrasound from 10/24/2019. 4. Decreased ascites and subcutaneous edema since 10/23/2019. 5. 3 mm nodule in the left lung is indeterminate. No follow-up needed if patient is low-risk. Non-contrast chest CT can be considered in 12 months if patient is high-risk. This recommendation follows the consensus statement: Guidelines for Management of Incidental Pulmonary Nodules Detected on CT Images: From the Fleischner Society 2017; Radiology 2017; 284:228-243. Electronically Signed   By: Markus Daft M.D.   On: 11/07/2019 13:48     Medications:     Scheduled Medications: . carvedilol  3.125 mg Oral BID WC  . Chlorhexidine Gluconate Cloth  6 each Topical Daily  . hydrALAZINE  100 mg Oral Q8H  . insulin aspart  0-5 Units Subcutaneous QHS  . insulin aspart  0-9 Units Subcutaneous TID WC  . insulin glargine  12 Units Subcutaneous Daily  . isosorbide mononitrate  60 mg Oral Daily  . mexiletine  200 mg Oral Q12H  . sodium chloride flush  10-40 mL Intracatheter Q12H    Infusions: . sodium chloride Stopped (11/07/19 2142)  . bivalirudin (ANGIOMAX) infusion 0.5 mg/mL (Non-ACS indications) 0.05 mg/kg/hr (11/08/19 0000)  . furosemide Stopped (11/07/19 2124)    PRN Medications: sodium chloride, acetaminophen **OR** acetaminophen, alteplase, docusate sodium, Gerhardt's butt cream,  HYDROcodone-acetaminophen, ondansetron (ZOFRAN) IV, sodium chloride flush     Assessment/Plan   1. Acute Systolic Heart Failure - BNP >1600 on admit. ECHO this admit  EF 20-25%, pericardial effusion, mild LVH, RA/LA dilated.  RV down. Cardiomyopathy likely 2/2 heavy PVC burden, possibly uncontrolled hypertension as well.  - near baseline weight, creatinine bump today with weight down >70 lbs. L - Now down >70 pounds on lasix 120 IV bid and metolazone added again yesterday. EDW 250 lbs per  patient, still ~13 lbs up with slight Cr bump today. LE edema resolved but +JVD. Cont. Lasix & metolazone one more day.  - RHC today, no coronary angio due to AKI - NPO this morning  - cont. hydral 100 mg tid, imdur 60 mg qd - coreg & mexiletine 2/2 heavy PVC burden - increase coreg - No ACE/ARB or spiro with ARF  2. Paroxysmal A fib RVR Resolved, chemically converted to sinus rhythm with amio, switched to mexiletine & coreg for significant PVC burden. 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    3. HTN  BP controlled on hydral 100 mg tid, imdur 60 mg qd, and coreg 3.125.  - Would not drop SBP < 120 to preserve renal perfusion  4. AKI  Creatinine on admit 1.76, initially worsened with diuresis. Marked volume overload. Started on CVVHD on 11/9 and stopped 11/13. Baseline creatinine thought to be ~2 per nephro.  - Having good Uop. Continuing to diurese, creatinine bump today with added metolazone yesterday.    - Urology saw: Bilateral staghorn calculi. No hydronephrosis so no role for percutaneous nephrostomy tubes.    5. Uncontrolled DM Hgb A1C 11.7. TRH managing On SSI and lantus 12   6. Uterine Mass Likely fibroid per GYN. Needs outpatient follow up.   7. Iron Deficiency Anemia  Hgb stable at 9.1. Ferritin 20. Completed feraheme 11/13.   8. Thrombocytopenia - resolved Platelets 174 today. Previously positive HIT antibody, negative SRA.   9. Umbilical Hernia CT scan with increased pain yesterday showed fat and fluid, no bowel within the hernia. Will need outpatient follow-up for surgery.    Length of Stay: [place], DO  11/08/2019, 6:04 AM  Patient seen and examined with the above-signed Advanced Practice Provider and/or Housestaff. I personally reviewed laboratory data, imaging studies and relevant notes. I independently examined the patient and formulated the important aspects of the plan. I have edited the note to reflect  any of my changes or salient points. I have personally discussed the plan with the patient and/or family.  She continues to diurese well on IV lasix and metolazone. Slight bump in creatinine today but will continue IV lasix and metolazone one more day. Paln RHC tomorrow am. Still with frequent PVCs despite mexilitene. Will increase mexilitene to 300 bid. Discussed dosing with PharmD personally.  Maintaining NSR. Continue heparin for now. Can switch to eliquis after cath.   AB pain improved (not resolved). No n/v. CT reassuring. Appreciate GSU help.    [name], MD  9:16 AM

## 2019-11-08 NOTE — Progress Notes (Addendum)
Patient ID: Adrienne Lane, female   DOB: [birth year], 53 y.o.   MRN: [id number]     Advanced Heart Failure Rounding Note  PCP-Cardiologist: Donato Heinz, MD   Subjective:    Transferred from [facility] to [facility] for HF management. Marked volume overload. Started on CVVHD.  CVVH stopped 11/13, high dose Lasix at 120mg  bid continued, metolazone added yesterday. Slight Cr/BUN bump today.    No SOB or orthopnea, ambulating. Some umbilical hernia pain but only at night, agrees with plan for outpatient elective surgery. Down 1kg overnight. Weight down 29kg total. No orthopnea or PND. Working with PT.  Started on mexilitene for high PVC burden on 11/17. PVC burden 25-30%.    Objective:   Weight Range: 119.7 kg Body mass index is 40.12 kg/m.   Vital Signs:   Temp:  [97.4 F (36.3 C)-98.1 F (36.7 C)] 98.1 F (36.7 C) (11/19 0341) Pulse Rate:  [44-94] 80 (11/19 0341) Resp:  [15-26] 20 (11/19 0341) BP: (126-154)/(72-92) 139/72 (11/19 0341) SpO2:  [97 %-100 %] 100 % (11/19 0341) Weight:  [119.7 kg] 119.7 kg (11/19 0341) Last BM Date: 11/06/19  Weight change: Filed Weights   11/06/19 0541 11/07/19 0500 11/08/19 0341  Weight: 121.4 kg 120.4 kg 119.7 kg    Intake/Output:   Intake/Output Summary (Last 24 hours) at 11/08/2019 0604 Last data filed at 11/08/2019 0422 Gross per 24 hour  Intake 1067.68 ml  Output 2950 ml  Net -1882.32 ml      Physical Exam   General:  Supine in bed, NAD HEENT: Normal anicteric  Neck: supple. Mild JVD, improving, no bruits. No lymphadenopathy or thryomegaly appreciated. Cor: PMI nondisplaced. irregular rate & rhythm. No r/m/g Lungs: CTAB,  No wheeze Abdomen: obese soft, nondistended. Indurated umbilical hernia, slightly tender. Good bowel sounds. Extremities: no cyanosis, clubbing, rash, tr-1+ edema Neuro: alert & oriented x 3, cranial nerves grossly intact. moves all 4 extremities w/o difficulty. Affect pleasant   Telemetry   NSR 88, PVC  burden 25-30%, episodes of bigeminy, personally reviewed.   Labs    CBC Recent Labs    11/07/19 0224 11/08/19 0215  WBC 4.7 4.7  HGB 9.1* 9.4*  HCT 28.4* 29.4*  MCV 79.6* 79.7*  PLT 139* 408   Basic Metabolic Panel Recent Labs    11/07/19 0224 11/08/19 0215  NA 140 137  K 3.7 4.0  CL 100 98  CO2 28 26  GLUCOSE 202* 261*  BUN 26* 32*  CREATININE 2.17* 2.50*  CALCIUM 9.2 9.2  MG 2.0 1.9  PHOS 4.3 4.5   Liver Function Tests Recent Labs    11/07/19 0224 11/08/19 0215  ALBUMIN 3.2* 3.4*   No results for input(s): LIPASE, AMYLASE in the last 72 hours. Cardiac Enzymes No results for input(s): CKTOTAL, CKMB, CKMBINDEX, TROPONINI in the last 72 hours.  BNP: BNP (last 3 results) Recent Labs    10/20/19 2029  BNP 1,623.1*    ProBNP (last 3 results) No results for input(s): PROBNP in the last 8760 hours.   D-Dimer No results for input(s): DDIMER in the last 72 hours. Hemoglobin A1C No results for input(s): HGBA1C in the last 72 hours. Fasting Lipid Panel No results for input(s): CHOL, HDL, LDLCALC, TRIG, CHOLHDL, LDLDIRECT in the last 72 hours. Thyroid Function Tests No results for input(s): TSH, T4TOTAL, T3FREE, THYROIDAB in the last 72 hours.  Invalid input(s): FREET3  Other results:   Imaging    Ct Abdomen Pelvis Wo Contrast  Result Date: 11/07/2019 CLINICAL  DATA:  Evaluate for incarcerated umbilical hernia. EXAM: CT ABDOMEN AND PELVIS WITHOUT CONTRAST TECHNIQUE: Multidetector CT imaging of the abdomen and pelvis was performed following the standard protocol without IV contrast. COMPARISON:  10/23/2019 FINDINGS: Lower chest: Small calcification along the medial right lung base. 3 mm nodule along the left major fissure on sequence 5, image 7. Slightly mosaic attenuation in the lower lungs could represent air trapping versus areas of atelectasis. No significant pleural effusions. Hepatobiliary: Again noted is a small amount of perihepatic ascites.  Perihepatic ascites has slightly decreased from the comparison examination. Gallbladder has been surgically removed. Pancreas: Unremarkable. No pancreatic ductal dilatation or surrounding inflammatory changes. Spleen: Normal in size without focal abnormality. Adrenals/Urinary Tract: Adrenal glands are poorly characterized. Large bilateral staghorn calculi appear to be involving most of the renal calices. Evidence for cortical scarring or atrophy along the left kidney lower pole. Normal appearance of the urinary bladder. Stomach/Bowel: There is oral contrast on this examination. Oral contrast in the stomach and small bowel. There is no contrast in the colon but there is no evidence for bowel extending into the umbilical hernia. No evidence for bowel obstruction or focal bowel inflammation. Vascular/Lymphatic: Atherosclerotic calcifications in the aorta and iliac arteries. Negative for an aortic aneurysm. No significant lymph node enlargement but limited evaluation on this noncontrast examination. Reproductive: Again noted is a markedly enlarged uterus measuring 18.2 x 14.0 x 16.9 cm. Uterus is heterogeneous and difficult to evaluate for fibroids or a uterine mass. No gross adnexal lesions. Other: Again noted is a umbilical or periumbilical hernia containing fat and fluid. This ventral hernia is large measuring up to 7.1 cm in the craniocaudal dimension. Diffuse subcutaneous edema in the anterior abdominal soft tissues. Decreased subcutaneous edema appeared to the previous examination. Negative for free intraperitoneal air. Musculoskeletal: Disc space narrowing and endplate changes at G3-O7. IMPRESSION: 1. Large umbilical hernia containing fat and fluid. Fluid within the hernia could represent ascites but cannot exclude incarceration or inflammation involving the umbilical hernia. No evidence for bowel within this umbilical hernia. 2. Large bilateral renal staghorn calculi. 3. Markedly enlarged uterus. Differential  would include uterine fibroids versus endometrial mass. Consider further characterization with a pelvic MRI as was mentioned on the ultrasound from 10/24/2019. 4. Decreased ascites and subcutaneous edema since 10/23/2019. 5. 3 mm nodule in the left lung is indeterminate. No follow-up needed if patient is low-risk. Non-contrast chest CT can be considered in 12 months if patient is high-risk. This recommendation follows the consensus statement: Guidelines for Management of Incidental Pulmonary Nodules Detected on CT Images: From the Fleischner Society 2017; Radiology 2017; 284:228-243. Electronically Signed   By: Markus Daft M.D.   On: 11/07/2019 13:48     Medications:     Scheduled Medications: . carvedilol  3.125 mg Oral BID WC  . Chlorhexidine Gluconate Cloth  6 each Topical Daily  . hydrALAZINE  100 mg Oral Q8H  . insulin aspart  0-5 Units Subcutaneous QHS  . insulin aspart  0-9 Units Subcutaneous TID WC  . insulin glargine  12 Units Subcutaneous Daily  . isosorbide mononitrate  60 mg Oral Daily  . mexiletine  200 mg Oral Q12H  . sodium chloride flush  10-40 mL Intracatheter Q12H    Infusions: . sodium chloride Stopped (11/07/19 2142)  . bivalirudin (ANGIOMAX) infusion 0.5 mg/mL (Non-ACS indications) 0.05 mg/kg/hr (11/08/19 0000)  . furosemide Stopped (11/07/19 2124)    PRN Medications: sodium chloride, acetaminophen **OR** acetaminophen, alteplase, docusate sodium, Gerhardt's butt cream,  HYDROcodone-acetaminophen, ondansetron (ZOFRAN) IV, sodium chloride flush     Assessment/Plan   1. Acute Systolic Heart Failure - BNP >1600 on admit. ECHO this admit  EF 20-25%, pericardial effusion, mild LVH, RA/LA dilated.  RV down. Cardiomyopathy likely 2/2 heavy PVC burden, possibly uncontrolled hypertension as well.  - near baseline weight, creatinine bump today with weight down >70 lbs. L - Now down >70 pounds on lasix 120 IV bid and metolazone added again yesterday. EDW 250 lbs per  patient, still ~13 lbs up with slight Cr bump today. LE edema resolved but +JVD. Cont. Lasix & metolazone one more day.  - RHC today, no coronary angio due to AKI - NPO this morning  - cont. hydral 100 mg tid, imdur 60 mg qd - coreg & mexiletine 2/2 heavy PVC burden - increase coreg - No ACE/ARB or spiro with ARF  2. Paroxysmal A fib RVR Resolved, chemically converted to sinus rhythm with amio, switched to mexiletine & coreg for significant PVC burden. 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    3. HTN  BP controlled on hydral 100 mg tid, imdur 60 mg qd, and coreg 3.125.  - Would not drop SBP < 120 to preserve renal perfusion  4. AKI  Creatinine on admit 1.76, initially worsened with diuresis. Marked volume overload. Started on CVVHD on 11/9 and stopped 11/13. Baseline creatinine thought to be ~2 per nephro.  - Having good Uop. Continuing to diurese, creatinine bump today with added metolazone yesterday.    - Urology saw: Bilateral staghorn calculi. No hydronephrosis so no role for percutaneous nephrostomy tubes.    5. Uncontrolled DM Hgb A1C 11.7. TRH managing On SSI and lantus 12   6. Uterine Mass Likely fibroid per GYN. Needs outpatient follow up.   7. Iron Deficiency Anemia  Hgb stable at 9.1. Ferritin 20. Completed feraheme 11/13.   8. Thrombocytopenia - resolved Platelets 174 today. Previously positive HIT antibody, negative SRA.   9. Umbilical Hernia CT scan with increased pain yesterday showed fat and fluid, no bowel within the hernia. Will need outpatient follow-up for surgery.    Length of Stay: [place], DO  11/08/2019, 6:04 AM  Patient seen and examined with the above-signed Advanced Practice Provider and/or Housestaff. I personally reviewed laboratory data, imaging studies and relevant notes. I independently examined the patient and formulated the important aspects of the plan. I have edited the note to reflect  any of my changes or salient points. I have personally discussed the plan with the patient and/or family.  She continues to diurese well on IV lasix and metolazone. Slight bump in creatinine today but will continue IV lasix and metolazone one more day. Paln RHC tomorrow am. Still with frequent PVCs despite mexilitene. Will increase mexilitene to 300 bid. Discussed dosing with PharmD personally.  Maintaining NSR. Continue heparin for now. Can switch to eliquis after cath.   AB pain improved (not resolved). No n/v. CT reassuring. Appreciate GSU help.    [name], MD  9:16 AM

## 2019-11-08 NOTE — Progress Notes (Signed)
CARDIAC REHAB PHASE I   PRE:  Rate/Rhythm: 82 SR bigeminy PVCs  BP:  Supine:   Sitting: 138/76  Standing:    SaO2: 98%RA  MODE:  Ambulation: 440 ft   POST:  Rate/Rhythm: 92 SR PVCs  BP:  Supine:   Sitting: 144/90  Standing:    SaO2: 95-100%RA 0958-1020 Pt walked 440 ft on RA with steady gait. I managed IV pole. Tolerated well. Stated felt like breathing normal.  To recliner with call bell.    Graylon Good, RN BSN  11/08/2019 10:15 AM

## 2019-11-08 NOTE — Progress Notes (Signed)
Nutrition Brief Note  Patient identified for LOS (day #19)  Wt Readings from Last 15 Encounters:  11/08/19 119.7 kg    Body mass index is 40.12 kg/m. Patient meets criteria for morbid obesity based on current BMI. Current weight is 263 lb. PTA, the only other weight recorded in the chart was on 09/06/18 at [facility] when she weighed 269 lb.   She has an incarcerated umbilical hernia without signs/symptoms of obstructive nature. She is currently NPO for RHC.   Current diet order is NPO, but patient wsa previously on Heart Healthy/Carb Modified and consumed 100% of all meals over the past 5 days.  Labs reviewed; A1c: 11.7% Medications reviewed; 120 mg IV lasix BID since 11/15, sliding scale novolog, 12 units lantus/day, 2 g IV Mg sulfate x1 run 11/19, 40 mEq Klor-Con x2 doses 11/18 and x1 dose 11/19.  No nutrition interventions warranted at this time. If nutrition issues arise, please consult RD.     Jarome Matin, MS, RD, LDN, [facility] Inpatient Clinical Dietitian Pager # [phone] After hours/weekend pager # [phone]

## 2019-11-08 NOTE — Progress Notes (Signed)
[facility]/Trauma Progress Note      Assessment/Plan Principal Problem:   Atrial fibrillation with rapid ventricular response (HCC) Active Problems:   CHF (congestive heart failure) (HCC)   Elevated troponin   AKI (acute kidney injury) ([place])   Hyperglycemia   Atrial fibrillation with RVR (HCC)   Right hip pain   Type 2 diabetes mellitus without complication (HCC)   Essential hypertension   Uterine fibroid  Incarcerated umbilical hernia - CT showed large umbilical hernia with fat and fluid, no bowel seen. - pt is having flatus and tolerating a diet. Not ill from this.  - VSS - recommend outpt follow up for repair  FEN: NPO per cards then okay for reg diet from our standpoint.  VTE: SCD's, per primary ID: no antibiotics. Afebrile, WBC 4.7 Follow up: Dr. Ninfa Linden     LOS: 19 days    Subjective: CC: tired  No pain at rest. She states pain only with palpation of hernia. She is having flatus and tolerated a regular diet last evening.   Objective: Vital signs in last 24 hours: Temp:  [97.4 F (36.3 C)-98.1 F (36.7 C)] 98.1 F (36.7 C) (11/19 0341) Pulse Rate:  [44-94] 80 (11/19 0341) Resp:  [15-26] 20 (11/19 0341) BP: (126-154)/(72-92) 139/72 (11/19 0341) SpO2:  [97 %-100 %] 100 % (11/19 0341) Weight:  [119.7 kg] 119.7 kg (11/19 0341) Last BM Date: 11/06/19  Intake/Output from previous day: 11/18 0701 - 11/19 0700 In: 1067.7 [P.O.:660; I.V.:283.7; IV Piggyback:124] Out: 3350 [Urine:3350] Intake/Output this shift: No intake/output data recorded.  PE:  Gen:  Alert, NAD, pleasant, cooperative Pulm:  Rate and effort normal Abd: Soft, ND, +BS, obese, umbilical hernia that is firm and TTP. Otherwise no TTP of abdomen. No peritonitis  Skin: no rashes noted, warm and dry   Anti-infectives: Anti-infectives (From admission, onward)   None      Lab Results:  Recent Labs    11/07/19 0224 11/08/19 0215  WBC 4.7 4.7  HGB 9.1* 9.4*  HCT 28.4*  29.4*  PLT 139* 174   BMET Recent Labs    11/07/19 0224 11/08/19 0215  NA 140 137  K 3.7 4.0  CL 100 98  CO2 28 26  GLUCOSE 202* 261*  BUN 26* 32*  CREATININE 2.17* 2.50*  CALCIUM 9.2 9.2   PT/INR No results for input(s): LABPROT, INR in the last 72 hours. CMP     Component Value Date/Time   NA 137 11/08/2019 0215   K 4.0 11/08/2019 0215   CL 98 11/08/2019 0215   CO2 26 11/08/2019 0215   GLUCOSE 261 (H) 11/08/2019 0215   BUN 32 (H) 11/08/2019 0215   CREATININE 2.50 (H) 11/08/2019 0215   CALCIUM 9.2 11/08/2019 0215   PROT 6.5 10/22/2019 0925   ALBUMIN 3.4 (L) 11/08/2019 0215   AST 23 10/22/2019 0925   ALT 22 10/22/2019 0925   ALKPHOS 69 10/22/2019 0925   BILITOT 1.2 10/22/2019 0925   GFRNONAA 22 (L) 11/08/2019 0215   GFRAA 25 (L) 11/08/2019 0215   Lipase  No results found for: LIPASE  Studies/Results: Ct Abdomen Pelvis Wo Contrast  Result Date: 11/07/2019 CLINICAL DATA:  Evaluate for incarcerated umbilical hernia. EXAM: CT ABDOMEN AND PELVIS WITHOUT CONTRAST TECHNIQUE: Multidetector CT imaging of the abdomen and pelvis was performed following the standard protocol without IV contrast. COMPARISON:  10/23/2019 FINDINGS: Lower chest: Small calcification along the medial right lung base. 3 mm nodule along the left major fissure on sequence 5,  image 7. Slightly mosaic attenuation in the lower lungs could represent air trapping versus areas of atelectasis. No significant pleural effusions. Hepatobiliary: Again noted is a small amount of perihepatic ascites. Perihepatic ascites has slightly decreased from the comparison examination. Gallbladder has been surgically removed. Pancreas: Unremarkable. No pancreatic ductal dilatation or surrounding inflammatory changes. Spleen: Normal in size without focal abnormality. Adrenals/Urinary Tract: Adrenal glands are poorly characterized. Large bilateral staghorn calculi appear to be involving most of the renal calices. Evidence for  cortical scarring or atrophy along the left kidney lower pole. Normal appearance of the urinary bladder. Stomach/Bowel: There is oral contrast on this examination. Oral contrast in the stomach and small bowel. There is no contrast in the colon but there is no evidence for bowel extending into the umbilical hernia. No evidence for bowel obstruction or focal bowel inflammation. Vascular/Lymphatic: Atherosclerotic calcifications in the aorta and iliac arteries. Negative for an aortic aneurysm. No significant lymph node enlargement but limited evaluation on this noncontrast examination. Reproductive: Again noted is a markedly enlarged uterus measuring 18.2 x 14.0 x 16.9 cm. Uterus is heterogeneous and difficult to evaluate for fibroids or a uterine mass. No gross adnexal lesions. Other: Again noted is a umbilical or periumbilical hernia containing fat and fluid. This ventral hernia is large measuring up to 7.1 cm in the craniocaudal dimension. Diffuse subcutaneous edema in the anterior abdominal soft tissues. Decreased subcutaneous edema appeared to the previous examination. Negative for free intraperitoneal air. Musculoskeletal: Disc space narrowing and endplate changes at U8-K8. IMPRESSION: 1. Large umbilical hernia containing fat and fluid. Fluid within the hernia could represent ascites but cannot exclude incarceration or inflammation involving the umbilical hernia. No evidence for bowel within this umbilical hernia. 2. Large bilateral renal staghorn calculi. 3. Markedly enlarged uterus. Differential would include uterine fibroids versus endometrial mass. Consider further characterization with a pelvic MRI as was mentioned on the ultrasound from 10/24/2019. 4. Decreased ascites and subcutaneous edema since 10/23/2019. 5. 3 mm nodule in the left lung is indeterminate. No follow-up needed if patient is low-risk. Non-contrast chest CT can be considered in 12 months if patient is high-risk. This recommendation follows  the consensus statement: Guidelines for Management of Incidental Pulmonary Nodules Detected on CT Images: From the Fleischner Society 2017; Radiology 2017; 284:228-243. Electronically Signed   By: Markus Daft M.D.   On: 11/07/2019 13:48     Kalman Drape, [facility] Please see amion for pager for the following: Cristine Polio, & Friday 7:00am - 4:30pm Thursdays 7:00am -11:30am

## 2019-11-08 NOTE — Progress Notes (Signed)
ANTICOAGULATION CONSULT NOTE  Pharmacy Consult:  Bivalirudin Indication: atrial fibrillation with +HIT Ab, negative SRA  No Known Allergies  Patient Measurements: Height: 5\' 8"  (172.7 cm) Weight: 263 lb 14.3 oz (119.7 kg) IBW/kg (Calculated) : 63.9  Vital Signs: Temp: 98 F (36.7 C) (11/19 0744) Temp Source: Oral (11/19 0744) BP: 132/80 (11/19 0744) Pulse Rate: 80 (11/19 0341)  Labs: Recent Labs    11/06/19 0245 11/07/19 0224 11/08/19 0215  HGB 9.2* 9.1* 9.4*  HCT 29.3* 28.4* 29.4*  PLT 131* 139* 174  APTT 74* 68* 66*  CREATININE 2.00* 2.17* 2.50*    Estimated Creatinine Clearance: 36.2 mL/min (A) (by C-G formula based on SCr of 2.5 mg/dL (H)).   Assessment: 61 yoF with PMH DM2, HTN, presents with R hip pain after falling. Also reports several months of LE edema, orthopnea, and abdominal distention. Found to have elevated BNP and to also be in AFib with RVR.  Patient has been on IV heparin, now with positive HIT antibody.  Pharmacy consulted to transition from IV heparin to bivalirudin.  APTT remains therapeutic.  No bleeding reported.  Platelet count continues to recover. SRA negative for HIT.   Goal of Therapy: APTT 50-85 sec Monitor platelets by anticoagulation protocol: Yes  Plan: Continue bivalirudin at 0.05 mg/kg/hr Daily aPTT and CBC Plan to transition to Eliquis after cath tomorrow AM  Richardine Service, PharmD PGY1 Pharmacy Resident Phone: [phone] 11/08/2019  8:55 AM  Please check [email or web address] for unit-specific pharmacy phone numbers.

## 2019-11-09 ENCOUNTER — Encounter (HOSPITAL_COMMUNITY): Payer: Self-pay | Admitting: Internal Medicine

## 2019-11-09 ENCOUNTER — Inpatient Hospital Stay (HOSPITAL_COMMUNITY)
Admission: EM | Disposition: A | Discharge status: Home / Self Care | Payer: Self-pay | Source: Home / Self Care | Attending: Internal Medicine

## 2019-11-09 ENCOUNTER — Ambulatory Visit (HOSPITAL_COMMUNITY): Admission: RE | Admit: 2019-11-09 | Payer: MEDICAID | Source: Home / Self Care | Admitting: Internal Medicine

## 2019-11-09 DIAGNOSIS — I480 Paroxysmal atrial fibrillation: Principal | ICD-10-CM

## 2019-11-09 HISTORY — PX: RIGHT HEART CATH: CATH118263

## 2019-11-09 LAB — RENAL FUNCTION PANEL
Albumin: 3.5 g/dL (ref 3.5–5.0)
Anion gap: 14 (ref 5–15)
BUN: 36 mg/dL — ABNORMAL HIGH (ref 6–20)
CO2: 27 mmol/L (ref 22–32)
Calcium: 9.6 mg/dL (ref 8.9–10.3)
Chloride: 98 mmol/L (ref 98–111)
Creatinine, Ser: 2.58 mg/dL — ABNORMAL HIGH (ref 0.44–1.00)
GFR calc Af Amer: 24 mL/min — ABNORMAL LOW (ref >60)
GFR calc non Af Amer: 21 mL/min — ABNORMAL LOW (ref >60)
Glucose, Bld: 230 mg/dL — ABNORMAL HIGH (ref 70–99)
Phosphorus: 4.9 mg/dL — ABNORMAL HIGH (ref 2.5–4.6)
Potassium: 3.6 mmol/L (ref 3.5–5.1)
Sodium: 139 mmol/L (ref 135–145)

## 2019-11-09 LAB — POCT I-STAT EG7
Acid-Base Excess: 4 mmol/L — ABNORMAL HIGH (ref 0.0–2.0)
Acid-Base Excess: 2 mmol/L (ref 0.0–2.0)
Acid-Base Excess: 4 mmol/L — ABNORMAL HIGH (ref 0.0–2.0)
Bicarbonate: 28.9 mmol/L — ABNORMAL HIGH (ref 20.0–28.0)
Bicarbonate: 25.9 mmol/L (ref 20.0–28.0)
Bicarbonate: 28.3 mmol/L — ABNORMAL HIGH (ref 20.0–28.0)
Calcium, Ion: 1.24 mmol/L (ref 1.15–1.40)
Calcium, Ion: 1.18 mmol/L (ref 1.15–1.40)
Calcium, Ion: 1.22 mmol/L (ref 1.15–1.40)
HCT: 30 % — ABNORMAL LOW (ref 36.0–46.0)
HCT: 27 % — ABNORMAL LOW (ref 36.0–46.0)
HCT: 29 % — ABNORMAL LOW (ref 36.0–46.0)
Hemoglobin: 10.2 g/dL — ABNORMAL LOW (ref 12.0–15.0)
Hemoglobin: 9.2 g/dL — ABNORMAL LOW (ref 12.0–15.0)
Hemoglobin: 9.9 g/dL — ABNORMAL LOW (ref 12.0–15.0)
O2 Saturation: 72 %
O2 Saturation: 68 %
O2 Saturation: 70 %
Potassium: 3.4 mmol/L — ABNORMAL LOW (ref 3.5–5.1)
Potassium: 3.1 mmol/L — ABNORMAL LOW (ref 3.5–5.1)
Potassium: 3.2 mmol/L — ABNORMAL LOW (ref 3.5–5.1)
Sodium: 141 mmol/L (ref 135–145)
Sodium: 141 mmol/L (ref 135–145)
Sodium: 143 mmol/L (ref 135–145)
TCO2: 30 mmol/L (ref 22–32)
TCO2: 27 mmol/L (ref 22–32)
TCO2: 30 mmol/L (ref 22–32)
pCO2, Ven: 42.1 mmHg — ABNORMAL LOW (ref 44.0–60.0)
pCO2, Ven: 38.5 mmHg — ABNORMAL LOW (ref 44.0–60.0)
pCO2, Ven: 43 mmHg — ABNORMAL LOW (ref 44.0–60.0)
pH, Ven: 7.444 — ABNORMAL HIGH (ref 7.250–7.430)
pH, Ven: 7.427 (ref 7.250–7.430)
pH, Ven: 7.435 — ABNORMAL HIGH (ref 7.250–7.430)
pO2, Ven: 37 mmHg (ref 32.0–45.0)
pO2, Ven: 35 mmHg (ref 32.0–45.0)
pO2, Ven: 35 mmHg (ref 32.0–45.0)

## 2019-11-09 LAB — GLUCOSE, CAPILLARY
Glucose-Capillary: 156 mg/dL — ABNORMAL HIGH (ref 70–99)
Glucose-Capillary: 155 mg/dL — ABNORMAL HIGH (ref 70–99)
Glucose-Capillary: 157 mg/dL — ABNORMAL HIGH (ref 70–99)
Glucose-Capillary: 180 mg/dL — ABNORMAL HIGH (ref 70–99)

## 2019-11-09 LAB — CBC
HCT: 31.1 % — ABNORMAL LOW (ref 36.0–46.0)
Hemoglobin: 9.8 g/dL — ABNORMAL LOW (ref 12.0–15.0)
MCH: 25.2 pg — ABNORMAL LOW (ref 26.0–34.0)
MCHC: 31.5 g/dL (ref 30.0–36.0)
MCV: 79.9 fL — ABNORMAL LOW (ref 80.0–100.0)
Platelets: 229 10*3/uL (ref 150–400)
RBC: 3.89 MIL/uL (ref 3.87–5.11)
RDW: 20.5 % — ABNORMAL HIGH (ref 11.5–15.5)
WBC: 5.2 10*3/uL (ref 4.0–10.5)
nRBC: 0 % (ref 0.0–0.2)

## 2019-11-09 LAB — APTT: aPTT: 71 seconds — ABNORMAL HIGH (ref 24–36)

## 2019-11-09 LAB — MAGNESIUM: Magnesium: 2.1 mg/dL (ref 1.7–2.4)

## 2019-11-09 SURGERY — RIGHT HEART CATH
Anesthesia: LOCAL

## 2019-11-09 MED ORDER — FENTANYL CITRATE (PF) 100 MCG/2ML IJ SOLN
INTRAMUSCULAR | Status: AC
  Filled 2019-11-09: qty 2

## 2019-11-09 MED ORDER — ONDANSETRON HCL 4 MG/2ML IJ SOLN: 4.0000 mg | Freq: Four times a day (QID) | INTRAMUSCULAR | Status: DC | PRN

## 2019-11-09 MED ORDER — LABETALOL HCL 5 MG/ML IV SOLN: 10.0000 mg | INTRAVENOUS | Status: AC | PRN

## 2019-11-09 MED ORDER — SODIUM CHLORIDE 0.9 % IV SOLN: 250.0000 mL | INTRAVENOUS | Status: DC | PRN

## 2019-11-09 MED ORDER — SODIUM CHLORIDE 0.9% FLUSH
3.0000 mL | INTRAVENOUS | Status: DC | PRN
  Administered 2019-11-11: 3 mL via INTRAVENOUS
  Filled 2019-11-09: qty 3

## 2019-11-09 MED ORDER — HYDRALAZINE HCL 20 MG/ML IJ SOLN: 10.0000 mg | INTRAMUSCULAR | Status: AC | PRN

## 2019-11-09 MED ORDER — HEPARIN (PORCINE) IN NACL 1000-0.9 UT/500ML-% IV SOLN
INTRAVENOUS | Status: AC
  Filled 2019-11-09: qty 1000

## 2019-11-09 MED ORDER — ACETAMINOPHEN 325 MG PO TABS: 650.0000 mg | ORAL_TABLET | ORAL | Status: DC | PRN

## 2019-11-09 MED ORDER — FENTANYL CITRATE (PF) 100 MCG/2ML IJ SOLN
INTRAMUSCULAR | Status: DC | PRN
  Administered 2019-11-09: 25 ug via INTRAVENOUS

## 2019-11-09 MED ORDER — SODIUM CHLORIDE 0.9% FLUSH
3.0000 mL | Freq: Two times a day (BID) | INTRAVENOUS | Status: DC
  Administered 2019-11-10 – 2019-11-12 (×4): 3 mL via INTRAVENOUS

## 2019-11-09 MED ORDER — LIDOCAINE HCL (PF) 1 % IJ SOLN
INTRAMUSCULAR | Status: AC
  Filled 2019-11-09: qty 30

## 2019-11-09 MED ORDER — CARVEDILOL 6.25 MG PO TABS
6.2500 mg | ORAL_TABLET | Freq: Two times a day (BID) | ORAL | Status: DC
  Administered 2019-11-09 – 2019-11-10 (×3): 6.25 mg via ORAL
  Filled 2019-11-09 (×3): qty 1

## 2019-11-09 MED ORDER — APIXABAN 5 MG PO TABS
5.0000 mg | ORAL_TABLET | Freq: Two times a day (BID) | ORAL | Status: DC
  Administered 2019-11-09 – 2019-11-12 (×6): 5 mg via ORAL
  Filled 2019-11-09 (×6): qty 1

## 2019-11-09 MED ORDER — MIDAZOLAM HCL 2 MG/2ML IJ SOLN
INTRAMUSCULAR | Status: DC | PRN
  Administered 2019-11-09: 1 mg via INTRAVENOUS

## 2019-11-09 MED ORDER — SODIUM CHLORIDE 0.9 % IV SOLN
INTRAVENOUS | Status: AC | PRN
  Administered 2019-11-09: 999 mL via INTRAVENOUS

## 2019-11-09 MED ORDER — POTASSIUM CHLORIDE CRYS ER 20 MEQ PO TBCR
40.0000 meq | EXTENDED_RELEASE_TABLET | Freq: Once | ORAL | Status: AC
  Administered 2019-11-09: 06:00:00 40 meq via ORAL
  Filled 2019-11-09: qty 2

## 2019-11-09 MED ORDER — LIDOCAINE HCL (PF) 1 % IJ SOLN
INTRAMUSCULAR | Status: DC | PRN
  Administered 2019-11-09: 2 mL

## 2019-11-09 MED ORDER — MIDAZOLAM HCL 2 MG/2ML IJ SOLN
INTRAMUSCULAR | Status: AC
  Filled 2019-11-09: qty 2

## 2019-11-09 SURGICAL SUPPLY — 8 items
CATH BALLN WEDGE 5F 110CM (CATHETERS) ×1 IMPLANT
PACK CARDIAC CATHETERIZATION (CUSTOM PROCEDURE TRAY) ×2 IMPLANT
PROTECTION STATION PRESSURIZED (MISCELLANEOUS) ×2
SHEATH GLIDE SLENDER 4/5FR (SHEATH) ×1 IMPLANT
STATION PROTECTION PRESSURIZED (MISCELLANEOUS) IMPLANT
TRANSDUCER W/STOPCOCK (MISCELLANEOUS) ×2 IMPLANT
TUBING ART PRESS 72  MALE/FEM (TUBING) ×1
TUBING ART PRESS 72 MALE/FEM (TUBING) IMPLANT

## 2019-11-09 NOTE — Interval H&P Note (Signed)
History and Physical Interval Note:  11/09/2019 7:36 AM  Adrienne Lane  has presented today for surgery, with the diagnosis of heart failure.  The various methods of treatment have been discussed with the patient and family. After consideration of risks, benefits and other options for treatment, the patient has consented to  Procedure(s): RIGHT HEART CATH (N/A) as a surgical intervention.  The patient's history has been reviewed, patient examined, no change in status, stable for surgery.  I have reviewed the patient's chart and labs.  Questions were answered to the patient's satisfaction.     [name]

## 2019-11-09 NOTE — Discharge Instructions (Signed)

## 2019-11-09 NOTE — Progress Notes (Signed)
CARDIAC REHAB PHASE I   PRE:  Rate/Rhythm: 88 SR bigeminy PVCs  BP:  Supine:   Sitting: 125/56  Standing:    SaO2: 98%RA  MODE:  Ambulation: 450 ft   POST:  Rate/Rhythm: 95 SR PVCs  BP:  Supine:   Sitting: 139/69  Standing:    SaO2: 100%RA 1115-1145 Pt walked 450 ft on RA with steady gait and tolerated well. No complaints. Back to recliner after walk. Gave pt OFF THE BEAT booklet for atrial fib.  Graylon Good, RN BSN  11/09/2019 11:41 AM

## 2019-11-09 NOTE — Progress Notes (Signed)
Patient ID: Adrienne Lane, female   DOB: 07/11/1966, 53 y.o.   MRN: [id number]     Advanced Heart Failure Rounding Note  PCP-Cardiologist: Donato Heinz, MD   Subjective:    Transferred from [facility] to [facility] for HF management. Marked volume overload. Started on CVVHD.  CVVH stopped 11/13  Remains on high dose Lasix at 120mg  bid and metolazone.   Diuresing well. Weight down to 259  (admit 341)  Feeling better. Denies SOB, orthopnea or PND.   Switched from [place] for high PVC burden on 11/17. PVC burden 25-30%.   RHC this am RA = 11 RV = 47/15 PA = 49/17 (32) PCW = 20 Fick cardiac output/index = 8.8/3.8 PVR = 1.4 WU FA sat = 97% PA sat = 70%, 72% High SVC = 68%  Assessment: 1. Mild pulmonary venous HTN with normal output  Plan/Discussion:  This looks to be as good as we are going to get her for now as renal function now getting worse with diuresis.    Objective:   Weight Range: 117.8 kg Body mass index is 39.49 kg/m.   Vital Signs:   Temp:  [97.6 F (36.4 C)-98.4 F (36.9 C)] 98.4 F (36.9 C) (11/20 0345) Pulse Rate:  [64-100] 88 (11/20 0345) Resp:  [17-25] 25 (11/20 0345) BP: (132-163)/(67-84) 136/69 (11/20 0345) SpO2:  [92 %-100 %] 100 % (11/20 0345) Weight:  [117.8 kg] 117.8 kg (11/20 0436) Last BM Date: 11/06/19  Weight change: Filed Weights   11/07/19 0500 11/08/19 0341 11/09/19 0436  Weight: 120.4 kg 119.7 kg 117.8 kg    Intake/Output:   Intake/Output Summary (Last 24 hours) at 11/09/2019 0617 Last data filed at 11/09/2019 0535 Gross per 24 hour  Intake 1666.74 ml  Output 4550 ml  Net -2883.26 ml      Physical Exam   CVP 11 General:  Lying flat No resp difficulty HEENT: normal Neck: supple. JVP to jaw. Carotids 2+ bilat; no bruits. No lymphadenopathy or thryomegaly appreciated. Cor: PMI nondisplaced. Regular rate & rhythm. No rubs, gallops or murmurs. Lungs: clear Abdomen: obese soft, nondistended. + peri-umbilical  hernia mildly tender No hepatosplenomegaly. No bruits or masses. Good bowel sounds. Extremities: no cyanosis, clubbing, rash, trace edema Neuro: alert & orientedx3, cranial nerves grossly intact. moves all 4 extremities w/o difficulty. Affect pleasant    Telemetry   NSR 80s monomporphic PVC burden 25-30%, episodes of bigeminy Personally reviewed  Labs    CBC Recent Labs    11/08/19 0215 11/09/19 0201  WBC 4.7 5.2  HGB 9.4* 9.8*  HCT 29.4* 31.1*  MCV 79.7* 79.9*  PLT 174 829   Basic Metabolic Panel Recent Labs    11/08/19 0215 11/09/19 0201  NA 137 139  K 4.0 3.6  CL 98 98  CO2 26 27  GLUCOSE 261* 230*  BUN 32* 36*  CREATININE 2.50* 2.58*  CALCIUM 9.2 9.6  MG 1.9 2.1  PHOS 4.5 4.9*   Liver Function Tests Recent Labs    11/08/19 0215 11/09/19 0201  ALBUMIN 3.4* 3.5   No results for input(s): LIPASE, AMYLASE in the last 72 hours. Cardiac Enzymes No results for input(s): CKTOTAL, CKMB, CKMBINDEX, TROPONINI in the last 72 hours.  BNP: BNP (last 3 results) Recent Labs    10/20/19 2029  BNP 1,623.1*    ProBNP (last 3 results) No results for input(s): PROBNP in the last 8760 hours.   D-Dimer No results for input(s): DDIMER in the last 72 hours. Hemoglobin A1C No  results for input(s): HGBA1C in the last 72 hours. Fasting Lipid Panel No results for input(s): CHOL, HDL, LDLCALC, TRIG, CHOLHDL, LDLDIRECT in the last 72 hours. Thyroid Function Tests No results for input(s): TSH, T4TOTAL, T3FREE, THYROIDAB in the last 72 hours.  Invalid input(s): FREET3  Other results:   Imaging    No results found.   Medications:     Scheduled Medications: . carvedilol  3.125 mg Oral BID WC  . Chlorhexidine Gluconate Cloth  6 each Topical Daily  . hydrALAZINE  100 mg Oral Q8H  . insulin aspart  0-5 Units Subcutaneous QHS  . insulin aspart  0-9 Units Subcutaneous TID WC  . insulin glargine  12 Units Subcutaneous Daily  . isosorbide mononitrate  60 mg Oral  Daily  . mexiletine  300 mg Oral Q12H  . sodium chloride flush  10-40 mL Intracatheter Q12H  . sodium chloride flush  3 mL Intravenous Q12H    Infusions: . sodium chloride Stopped (11/09/19 0533)  . sodium chloride    . sodium chloride 10 mL/hr at 11/09/19 0534  . bivalirudin (ANGIOMAX) infusion 0.5 mg/mL (Non-ACS indications) 0.05 mg/kg/hr (11/09/19 0535)  . furosemide Stopped (11/08/19 1836)    PRN Medications: sodium chloride, sodium chloride, acetaminophen **OR** acetaminophen, alteplase, docusate sodium, Gerhardt's butt cream, HYDROcodone-acetaminophen, ondansetron (ZOFRAN) IV, sodium chloride flush, sodium chloride flush     Assessment/Plan   1. Acute Systolic Heart Failure - BNP >1600 on admit. ECHO this admit  EF 20-25%, pericardial effusion, mild LVH, RA/LA dilated.  RV down. Cardiomyopathy likely 2/2 heavy PVC burden, possibly uncontrolled hypertension as well.  - weight down 82 pounds. RHC as above. This looks to be about as dry as we can get her.  - Creatinine up - stop lasix.  - possibly switch to torsemide 40 bid in am - cont. hydral 100 mg tid, imdur 60 mg qd - coreg & mexiletine 2/2 heavy PVC burden - mexiletine increased on 11/18 - No ACE/ARB or spiro with ARF - Consider Farxiga soon   2. Paroxysmal A fib RVR - Resolved with chemical conversion to NSR on admit - Was started on amio , switched to mexiletine & coreg for significant PVC burden. - Mexilitine increased 11/18 to 300 mg tid 2/2 PVCs & bigeminy. Increase carvedilol today - still with high burden. Will follow. Can place zio in clinic. If not improving can refer to EP for consideration of PVC ablation  - will start eliquis after cath today.    3. HTN  - improving  4. AKI  Creatinine on admit 1.76, initially worsened with diuresis. Marked volume overload. Started on CVVHD on 11/9 and stopped 11/13. Baseline creatinine thought to be ~2 per nephro.  - Creatinine up to 2.5. Stop lasix  - Urology  saw: Bilateral staghorn calculi. No hydronephrosis so no role for percutaneous nephrostomy tubes.    5. Uncontrolled DM Hgb A1C 11.7. TRH managing On SSI and lantus 12   6. Uterine Mass -Likely fibroid per GYN. Needs outpatient follow up.   7. Iron Deficiency Anemia  -Hgb stable at 9.8. Ferritin 20. Completed feraheme 51/88.   9. Umbilical Hernia - GSU has evaluated. CT ok Will follow-up with surgery after discharge.   10. Thrombocytopenia - resolved.  - HIT negative - stop bival today. Start Eliquis    Length of Stay: 20  [name], MD  8:28 AM

## 2019-11-09 NOTE — Evaluation (Signed)
Physical Therapy Evaluation Patient Details Name: Adrienne Lane MRN: [id number] DOB: 1966-06-21 Today's Date: 11/09/2019   History of Present Illness  53 yo female admitted to ED on 10/29 with fall out of minivan and R hip and LE pain. Xray negative for fracture. Pt found to be in afib with RVR, sHF (new diagnosis). PMH includes DM with A1C 11.7, HTN. Pt started on CRRT on 11/9-11/13. s/p cardiac cath 11/19.  Clinical Impression  Patient presents with dyspnea on exertion and impaired endurance s/p above. Pt was requiring some assist for the last 2 months with standing and had limited walking tolerance due to SOB. Has support from spouse at home.Today, pt tolerated gait training and stair training with supervision-Mod I for safety. Noted to have 2/4 DOE however VSS throughout. Reports feeling better and eager to return home. Education re: walking program, importance of mobility/exercise, weighing self daily etc. Encouraged walking while in the hospital. Pt does not require skilled therapy services as pt functioning close to baseline. All education completed. Discharge from therapy.    Follow Up Recommendations Outpatient PT;Supervision - Intermittent(cardiac rehab)    Equipment Recommendations  None recommended by PT    Recommendations for Other Services       Precautions / Restrictions Precautions Precautions: None Restrictions Weight Bearing Restrictions: No      Mobility  Bed Mobility               General bed mobility comments: Up in chair upon PT arrival.  Transfers Overall transfer level: Independent Equipment used: None Transfers: Sit to/from Stand Sit to Stand: Independent         General transfer comment: Stood from chair without difficulty, use of UEs to assist. Reports she has been working on trying to stand without UE support.  Ambulation/Gait Ambulation/Gait assistance: Modified independent (Device/Increase time) Gait Distance (Feet): 400  Feet Assistive device: None Gait Pattern/deviations: Step-through pattern;Decreased stride length;Wide base of support Gait velocity: functional Gait velocity interpretation: 1.31 - 2.62 ft/sec, indicative of limited community ambulator General Gait Details: SLow, steady gait with wide BoS. 2/4 DOE. VSS throughout. No evidence of imbalance.  Stairs Stairs: Yes Stairs assistance: Supervision;Modified independent (Device/Increase time) Stair Management: Alternating pattern;Step to pattern;Two rails Number of Stairs: 5 General stair comments: Cues for technique and safety; alternating stepping technique to ascend and step to to descend.  Wheelchair Mobility    Modified Rankin (Stroke Patients Only)       Balance Overall balance assessment: Modified Independent Sitting-balance support: Feet supported;No upper extremity supported Sitting balance-Leahy Scale: Normal     Standing balance support: During functional activity Standing balance-Leahy Scale: Good                               Pertinent Vitals/Pain Pain Assessment: No/denies pain    Home Living Family/patient expects to be discharged to:: Private residence Living Arrangements: Spouse/significant other Available Help at Discharge: Family Type of Home: House Home Access: Stairs to enter Entrance Stairs-Rails: Right;Can reach Software engineer of Steps: 5 Home Layout: One level Home Equipment: None      Prior Function Level of Independence: Independent         Comments: Pt reports history of difficulty with transfers and occasionally requires assist from family over the last 2 months. Otherwise, pt reports independence with mobility PTA, no other history of falling besides fall prior to admission.     Hand Dominance   Dominant Hand:  Right    Extremity/Trunk Assessment   Upper Extremity Assessment Upper Extremity Assessment: Defer to OT evaluation    Lower Extremity  Assessment Lower Extremity Assessment: Overall WFL for tasks assessed    Cervical / Trunk Assessment Cervical / Trunk Assessment: Normal  Communication   Communication: No difficulties  Cognition Arousal/Alertness: Awake/alert Behavior During Therapy: WFL for tasks assessed/performed Overall Cognitive Status: Within Functional Limits for tasks assessed                                        General Comments General comments (skin integrity, edema, etc.): VSS    Exercises     Assessment/Plan    PT Assessment Patent does not need any further PT services  PT Problem List Decreased mobility;Decreased activity tolerance;Decreased balance;Cardiopulmonary status limiting activity       PT Treatment Interventions      PT Goals (Current goals can be found in the Care Plan section)  Acute Rehab PT Goals Patient Stated Goal: go home PT Goal Formulation: All assessment and education complete, DC therapy    Frequency     Barriers to discharge        Co-evaluation               AM-PAC PT "6 Clicks" Mobility  Outcome Measure Help needed turning from your back to your side while in a flat bed without using bedrails?: None Help needed moving from lying on your back to sitting on the side of a flat bed without using bedrails?: None Help needed moving to and from a bed to a chair (including a wheelchair)?: None Help needed standing up from a chair using your arms (e.g., wheelchair or bedside chair)?: None Help needed to walk in hospital room?: None Help needed climbing 3-5 steps with a railing? : A Little 6 Click Score: 23    End of Session   Activity Tolerance: Patient tolerated treatment well Patient left: in chair;with call bell/phone within reach Nurse Communication: Mobility status PT Visit Diagnosis: Other abnormalities of gait and mobility (R26.89);History of falling (Z91.81);Unsteadiness on feet (R26.81)    Time: 1165-7903 PT Time Calculation  (min) (ACUTE ONLY): 20 min   Charges:   PT Evaluation $PT Eval Moderate Complexity: 1 Mod          Marisa Severin, PT, DPT Acute Rehabilitation Services Pager [phone] Office [phone]      Marguarite Arbour A Sabra Heck 11/09/2019, 2:51 PM

## 2019-11-10 LAB — RENAL FUNCTION PANEL
Albumin: 3.3 g/dL — ABNORMAL LOW (ref 3.5–5.0)
Anion gap: 14 (ref 5–15)
BUN: 40 mg/dL — ABNORMAL HIGH (ref 6–20)
CO2: 25 mmol/L (ref 22–32)
Calcium: 9.3 mg/dL (ref 8.9–10.3)
Chloride: 98 mmol/L (ref 98–111)
Creatinine, Ser: 2.94 mg/dL — ABNORMAL HIGH (ref 0.44–1.00)
GFR calc Af Amer: 21 mL/min — ABNORMAL LOW (ref >60)
GFR calc non Af Amer: 18 mL/min — ABNORMAL LOW (ref >60)
Glucose, Bld: 201 mg/dL — ABNORMAL HIGH (ref 70–99)
Phosphorus: 4.7 mg/dL — ABNORMAL HIGH (ref 2.5–4.6)
Potassium: 3.8 mmol/L (ref 3.5–5.1)
Sodium: 137 mmol/L (ref 135–145)

## 2019-11-10 LAB — CBC
HCT: 28.2 % — ABNORMAL LOW (ref 36.0–46.0)
Hemoglobin: 9.2 g/dL — ABNORMAL LOW (ref 12.0–15.0)
MCH: 25.9 pg — ABNORMAL LOW (ref 26.0–34.0)
MCHC: 32.6 g/dL (ref 30.0–36.0)
MCV: 79.4 fL — ABNORMAL LOW (ref 80.0–100.0)
Platelets: 220 10*3/uL (ref 150–400)
RBC: 3.55 MIL/uL — ABNORMAL LOW (ref 3.87–5.11)
RDW: 20.5 % — ABNORMAL HIGH (ref 11.5–15.5)
WBC: 4.4 10*3/uL (ref 4.0–10.5)
nRBC: 0 % (ref 0.0–0.2)

## 2019-11-10 LAB — GLUCOSE, CAPILLARY
Glucose-Capillary: 167 mg/dL — ABNORMAL HIGH (ref 70–99)
Glucose-Capillary: 171 mg/dL — ABNORMAL HIGH (ref 70–99)
Glucose-Capillary: 174 mg/dL — ABNORMAL HIGH (ref 70–99)
Glucose-Capillary: 283 mg/dL — ABNORMAL HIGH (ref 70–99)

## 2019-11-10 LAB — MAGNESIUM: Magnesium: 2 mg/dL (ref 1.7–2.4)

## 2019-11-10 MED ORDER — CARVEDILOL 6.25 MG PO TABS
9.3750 mg | ORAL_TABLET | Freq: Two times a day (BID) | ORAL | Status: DC
  Administered 2019-11-10 – 2019-11-11 (×2): 9.375 mg via ORAL
  Filled 2019-11-10 (×2): qty 1

## 2019-11-10 NOTE — Plan of Care (Signed)
  Problem: Education: Goal: Knowledge of General Education information will improve Description: Including pain rating scale, medication(s)/side effects and non-pharmacologic comfort measures Outcome: Progressing   Problem: Health Behavior/Discharge Planning: Goal: Ability to manage health-related needs will improve Outcome: Progressing   Problem: Clinical Measurements: Goal: Ability to maintain clinical measurements within normal limits will improve Outcome: Progressing Goal: Will remain free from infection Outcome: Progressing Goal: Diagnostic test results will improve Outcome: Progressing Goal: Respiratory complications will improve Outcome: Progressing Goal: Cardiovascular complication will be avoided Outcome: Progressing   Problem: Activity: Goal: Risk for activity intolerance will decrease Outcome: Progressing   Problem: Nutrition: Goal: Adequate nutrition will be maintained Outcome: Progressing   Problem: Coping: Goal: Level of anxiety will decrease Outcome: Progressing   Problem: Elimination: Goal: Will not experience complications related to bowel motility Outcome: Progressing Goal: Will not experience complications related to urinary retention Outcome: Progressing   Problem: Pain Managment: Goal: General experience of comfort will improve Outcome: Progressing   Problem: Cardiac: Goal: Ability to achieve and maintain adequate cardiopulmonary perfusion will improve Outcome: Progressing   Problem: Activity: Goal: Capacity to carry out activities will improve Outcome: Progressing   Problem: Education: Goal: Ability to demonstrate management of disease process will improve Outcome: Progressing Goal: Ability to verbalize understanding of medication therapies will improve Outcome: Progressing Goal: Individualized Educational Video(s) Outcome: Progressing   Problem: Skin Integrity: Goal: Risk for impaired skin integrity will decrease Outcome:  Progressing   Problem: Safety: Goal: Ability to remain free from injury will improve Outcome: Progressing

## 2019-11-10 NOTE — Progress Notes (Signed)
Patient ID: Adrienne Lane, female   DOB: 1966-04-27, 53 y.o.   MRN: [id number]     Advanced Heart Failure Rounding Note  PCP-Cardiologist: Donato Heinz, MD   Subjective:    Transferred from [facility] to [facility] for HF management. Marked volume overload. Started on CVVHD.  CVVH stopped 11/13  She has diuresed well, weight down.  IV Lasix was stopped yesterday.  Creatinine trending up, 2.94 today.   Switched from amiodarone to mexiletine for high PVC burden on 11/17. PVC burden 25-30%.  Still with bigeminal PVCs today.   RHC this am RA = 11 RV = 47/15 PA = 49/17 (32) PCW = 20 Fick cardiac output/index = 8.8/3.8 PVR = 1.4 WU FA sat = 97% PA sat = 70%, 72% High SVC = 68%  Assessment: 1. Mild pulmonary venous HTN with normal output  Plan/Discussion:  This looks to be as good as we are going to get her for now as renal function now getting worse with diuresis.    Objective:   Weight Range: 113.6 kg Body mass index is 38.08 kg/m.   Vital Signs:   Temp:  [97.2 F (36.2 C)-98.1 F (36.7 C)] 97.2 F (36.2 C) (11/21 0746) Pulse Rate:  [73-91] 91 (11/21 0746) Resp:  [18-22] 20 (11/21 0746) BP: (124-140)/(55-74) 140/64 (11/21 0746) SpO2:  [97 %-99 %] 98 % (11/21 0746) Weight:  [113.6 kg] 113.6 kg (11/21 0500) Last BM Date: 11/06/19  Weight change: Filed Weights   11/08/19 0341 11/09/19 0436 11/10/19 0500  Weight: 119.7 kg 117.8 kg 113.6 kg    Intake/Output:   Intake/Output Summary (Last 24 hours) at 11/10/2019 0918 Last data filed at 11/10/2019 3716 Gross per 24 hour  Intake 120 ml  Output 950 ml  Net -830 ml      Physical Exam   General: NAD Neck: JVP 10 cm, no thyromegaly or thyroid nodule.  Lungs: Clear to auscultation bilaterally with normal respiratory effort. CV: Nondisplaced PMI.  Heart regularly irregular S1/S2 (PVCs), no S3/S4, no murmur.  1+ ankle edema.   Abdomen: Soft, nontender, no hepatosplenomegaly, no distention.  Skin: Intact without  lesions or rashes.  Neurologic: Alert and oriented x 3.  Psych: Normal affect. Extremities: No clubbing or cyanosis.  HEENT: Normal.    Telemetry   NSR 80s with ventricular bigeminy. Personally reviewed  Labs    CBC Recent Labs    11/09/19 0201  11/09/19 0751 11/10/19 0139  WBC 5.2  --   --  4.4  HGB 9.8*   < > 9.9* 9.2*  HCT 31.1*   < > 29.0* 28.2*  MCV 79.9*  --   --  79.4*  PLT 229  --   --  220   < > = values in this interval not displayed.   Basic Metabolic Panel Recent Labs    11/09/19 0201  11/09/19 0751 11/10/19 0139  NA 139   < > 141 137  K 3.6   < > 3.2* 3.8  CL 98  --   --  98  CO2 27  --   --  25  GLUCOSE 230*  --   --  201*  BUN 36*  --   --  40*  CREATININE 2.58*  --   --  2.94*  CALCIUM 9.6  --   --  9.3  MG 2.1  --   --  2.0  PHOS 4.9*  --   --  4.7*   < > = values in this  interval not displayed.   Liver Function Tests Recent Labs    11/09/19 0201 11/10/19 0139  ALBUMIN 3.5 3.3*   No results for input(s): LIPASE, AMYLASE in the last 72 hours. Cardiac Enzymes No results for input(s): CKTOTAL, CKMB, CKMBINDEX, TROPONINI in the last 72 hours.  BNP: BNP (last 3 results) Recent Labs    10/20/19 2029  BNP 1,623.1*    ProBNP (last 3 results) No results for input(s): PROBNP in the last 8760 hours.   D-Dimer No results for input(s): DDIMER in the last 72 hours. Hemoglobin A1C No results for input(s): HGBA1C in the last 72 hours. Fasting Lipid Panel No results for input(s): CHOL, HDL, LDLCALC, TRIG, CHOLHDL, LDLDIRECT in the last 72 hours. Thyroid Function Tests No results for input(s): TSH, T4TOTAL, T3FREE, THYROIDAB in the last 72 hours.  Invalid input(s): FREET3  Other results:   Imaging    No results found.   Medications:     Scheduled Medications: . apixaban  5 mg Oral BID  . carvedilol  9.375 mg Oral BID WC  . Chlorhexidine Gluconate Cloth  6 each Topical Daily  . hydrALAZINE  100 mg Oral Q8H  . insulin aspart   0-5 Units Subcutaneous QHS  . insulin aspart  0-9 Units Subcutaneous TID WC  . insulin glargine  12 Units Subcutaneous Daily  . isosorbide mononitrate  60 mg Oral Daily  . mexiletine  300 mg Oral Q12H  . sodium chloride flush  10-40 mL Intracatheter Q12H  . sodium chloride flush  3 mL Intravenous Q12H  . sodium chloride flush  3 mL Intravenous Q12H    Infusions: . sodium chloride Stopped (11/09/19 0533)  . sodium chloride      PRN Medications: sodium chloride, sodium chloride, acetaminophen, alteplase, docusate sodium, Gerhardt's butt cream, HYDROcodone-acetaminophen, ondansetron (ZOFRAN) IV, sodium chloride flush, sodium chloride flush     Assessment/Plan   1. Acute Systolic Heart Failure - BNP >1600 on admit. ECHO this admit  EF 20-25%, pericardial effusion, mild LVH, RA/LA dilated.  RV down. Cardiomyopathy likely 2/2 heavy PVC burden, possibly uncontrolled hypertension as well.  - weight down considerably. RHC as above. Creatinine rising, 2.94 today.  Would give diuretic holiday to allow equilibration today, if creatinine stabilizes could start torsemide 40 mg bid tomorrow.  - cont. hydral 100 mg tid, imdur 60 mg qd - coreg & mexiletine 2/2 heavy PVC burden - mexiletine increased on 11/18.  BP is normal to elevated, will increase Coreg to 9.375 mg bid today.  - No ACE/ARB or spiro with AKI.  - Consider [name] soon   2. Paroxysmal A fib RVR - Resolved with chemical conversion to NSR on admit - Was started on amiodarone, switched to mexiletine & coreg for significant PVC burden. - Continue Eliquis.   3. PVCs - Mexilitine increased 11/18 to 300 mg tid 2/2 PVCs & bigeminy. Increase carvedilol again today - still with high burden, ventricular bigeminy currently. Will follow. Can place zio in clinic. If not improving can refer to EP for consideration of PVC ablation    4. HTN  - improving  5. AKI  Creatinine on admit 1.76, initially worsened with diuresis. Marked volume  overload. Started on CVVHD on 11/9 and stopped 11/13. Baseline creatinine thought to be ~2 per nephro.  - Creatinine up to 2.94 today. Off IV Lasix, will reassess tomorrow, start torsemide if creatinine stabilizes.  - Urology saw: Bilateral staghorn calculi. No hydronephrosis so no role for percutaneous nephrostomy tubes.  6. Uncontrolled DM Hgb A1C 11.7. TRH managing On SSI and lantus 12   7. Uterine Mass -Likely fibroid per GYN. Needs outpatient follow up.   8. Iron Deficiency Anemia  -Hgb stable at 9.8. Ferritin 20. Completed feraheme 59/09.   9. Umbilical Hernia - [facility] has evaluated. CT ok Will follow-up with surgery after discharge.   10. Thrombocytopenia - resolved.  - HIT negative   Length of Stay: Roland, MD  9:18 AM

## 2019-11-10 NOTE — Progress Notes (Signed)
Pt is stable throughout the day, vitals stable, ambulated in a hallway without distress and chest pain, will continue to monitor  Palma Holter, RN

## 2019-11-10 NOTE — Progress Notes (Signed)
CARDIAC REHAB PHASE I   PRE:  Rate/Rhythm: 21 SR with PVCs   BP:  Sitting: 125/80      SaO2: 96% RA   MODE:  Ambulation: 450 ft   POST:  Rate/Rhythm: 86 SR with PVCs  BP:  Sitting: 122/75      SaO2: 95% RA   Pt just walked with nurse, but willing to walk again. Pt ambulated 474ft independently. Pt denied any complaints of CP, SOB or lightheadedness. Spoke with pt regarding CRPII. Left pt brochure.   [id number]  Carma Lair MS, ACSM CEP  1:55 PM 11/10/2019

## 2019-11-11 LAB — RENAL FUNCTION PANEL
Albumin: 3.3 g/dL — ABNORMAL LOW (ref 3.5–5.0)
Anion gap: 12 (ref 5–15)
BUN: 48 mg/dL — ABNORMAL HIGH (ref 6–20)
CO2: 27 mmol/L (ref 22–32)
Calcium: 9.3 mg/dL (ref 8.9–10.3)
Chloride: 101 mmol/L (ref 98–111)
Creatinine, Ser: 2.86 mg/dL — ABNORMAL HIGH (ref 0.44–1.00)
GFR calc Af Amer: 21 mL/min — ABNORMAL LOW (ref >60)
GFR calc non Af Amer: 18 mL/min — ABNORMAL LOW (ref >60)
Glucose, Bld: 130 mg/dL — ABNORMAL HIGH (ref 70–99)
Phosphorus: 4.7 mg/dL — ABNORMAL HIGH (ref 2.5–4.6)
Potassium: 3.8 mmol/L (ref 3.5–5.1)
Sodium: 140 mmol/L (ref 135–145)

## 2019-11-11 LAB — CBC
HCT: 28.1 % — ABNORMAL LOW (ref 36.0–46.0)
Hemoglobin: 8.9 g/dL — ABNORMAL LOW (ref 12.0–15.0)
MCH: 25.4 pg — ABNORMAL LOW (ref 26.0–34.0)
MCHC: 31.7 g/dL (ref 30.0–36.0)
MCV: 80.3 fL (ref 80.0–100.0)
Platelets: 228 10*3/uL (ref 150–400)
RBC: 3.5 MIL/uL — ABNORMAL LOW (ref 3.87–5.11)
RDW: 20.7 % — ABNORMAL HIGH (ref 11.5–15.5)
WBC: 3.9 10*3/uL — ABNORMAL LOW (ref 4.0–10.5)
nRBC: 0 % (ref 0.0–0.2)

## 2019-11-11 LAB — MAGNESIUM: Magnesium: 2.3 mg/dL (ref 1.7–2.4)

## 2019-11-11 LAB — GLUCOSE, CAPILLARY
Glucose-Capillary: 140 mg/dL — ABNORMAL HIGH (ref 70–99)
Glucose-Capillary: 231 mg/dL — ABNORMAL HIGH (ref 70–99)
Glucose-Capillary: 168 mg/dL — ABNORMAL HIGH (ref 70–99)
Glucose-Capillary: 191 mg/dL — ABNORMAL HIGH (ref 70–99)

## 2019-11-11 MED ORDER — POTASSIUM CHLORIDE CRYS ER 20 MEQ PO TBCR
40.0000 meq | EXTENDED_RELEASE_TABLET | Freq: Once | ORAL | Status: AC
  Administered 2019-11-11: 10:00:00 40 meq via ORAL
  Filled 2019-11-11: qty 2

## 2019-11-11 MED ORDER — CARVEDILOL 12.5 MG PO TABS
12.5000 mg | ORAL_TABLET | Freq: Two times a day (BID) | ORAL | Status: DC
  Administered 2019-11-11 – 2019-11-12 (×2): 12.5 mg via ORAL
  Filled 2019-11-11 (×2): qty 1

## 2019-11-11 MED ORDER — TORSEMIDE 20 MG PO TABS
40.0000 mg | ORAL_TABLET | Freq: Two times a day (BID) | ORAL | Status: DC
  Administered 2019-11-11 – 2019-11-12 (×3): 40 mg via ORAL
  Filled 2019-11-11 (×3): qty 2

## 2019-11-11 NOTE — Progress Notes (Signed)
Pt is stable, ambulated in a hallway without chest pain and distress, husband is visiting her, denies any complain this time, will continue to monitor the patient  Palma Holter, RN

## 2019-11-11 NOTE — Progress Notes (Signed)
Patient ID: Adrienne Lane, female   DOB: [birth year], 53 y.o.   MRN: [id number]     Advanced Heart Failure Rounding Note  PCP-Cardiologist: Donato Heinz, MD   Subjective:    Transferred from [facility] to [facility] for HF management. Marked volume overload. Started on CVVHD.  CVVH stopped 11/13  She has diuresed well, weight down.  IV Lasix was stopped Friday.  Creatinine rose and diuretics held yesterday.  Creatinine 2.94 => 2.86 today.    Switched from amiodarone to mexiletine for high PVC burden on 11/17. PVC burden 25-30%.  Looks like less PVCs today (was in bigeminy yesterday).   RHC this am RA = 11 RV = 47/15 PA = 49/17 (32) PCW = 20 Fick cardiac output/index = 8.8/3.8 PVR = 1.4 WU FA sat = 97% PA sat = 70%, 72% High SVC = 68%  Assessment: 1. Mild pulmonary venous HTN with normal output  Plan/Discussion:  This looks to be as good as we are going to get her for now as renal function now getting worse with diuresis.    Objective:   Weight Range: 116.9 kg Body mass index is 39.19 kg/m.   Vital Signs:   Temp:  [97.4 F (36.3 C)-98.4 F (36.9 C)] 98.4 F (36.9 C) (11/22 0745) Pulse Rate:  [61-88] 88 (11/22 0745) Resp:  [16-24] 18 (11/22 0745) BP: (120-147)/(56-74) 136/57 (11/22 0745) SpO2:  [98 %-100 %] 100 % (11/22 0745) Weight:  [116.9 kg] 116.9 kg (11/22 0359) Last BM Date: 11/10/19  Weight change: Filed Weights   11/09/19 0436 11/10/19 0500 11/11/19 0359  Weight: 117.8 kg 113.6 kg 116.9 kg    Intake/Output:   Intake/Output Summary (Last 24 hours) at 11/11/2019 0836 Last data filed at 11/11/2019 0745 Gross per 24 hour  Intake 720 ml  Output 1900 ml  Net -1180 ml      Physical Exam   General: NAD Neck: JVP 8 cm, no thyromegaly or thyroid nodule.  Lungs: Clear to auscultation bilaterally with normal respiratory effort. CV: Nondisplaced PMI.  Heart regular S1/S2, no S3/S4, no murmur.  1+ ankle edema.  Abdomen: Soft, nontender, no  hepatosplenomegaly, no distention.  Skin: Intact without lesions or rashes.  Neurologic: Alert and oriented x 3.  Psych: Normal affect. Extremities: No clubbing or cyanosis.  HEENT: Normal.    Telemetry   NSR 80s with occasional PVCs, less than yesterday. Personally reviewed  Labs    CBC Recent Labs    11/10/19 0139 11/11/19 0211  WBC 4.4 3.9*  HGB 9.2* 8.9*  HCT 28.2* 28.1*  MCV 79.4* 80.3  PLT 220 563   Basic Metabolic Panel Recent Labs    11/10/19 0139 11/11/19 0211  NA 137 140  K 3.8 3.8  CL 98 101  CO2 25 27  GLUCOSE 201* 130*  BUN 40* 48*  CREATININE 2.94* 2.86*  CALCIUM 9.3 9.3  MG 2.0 2.3  PHOS 4.7* 4.7*   Liver Function Tests Recent Labs    11/10/19 0139 11/11/19 0211  ALBUMIN 3.3* 3.3*   No results for input(s): LIPASE, AMYLASE in the last 72 hours. Cardiac Enzymes No results for input(s): CKTOTAL, CKMB, CKMBINDEX, TROPONINI in the last 72 hours.  BNP: BNP (last 3 results) Recent Labs    10/20/19 2029  BNP 1,623.1*    ProBNP (last 3 results) No results for input(s): PROBNP in the last 8760 hours.   D-Dimer No results for input(s): DDIMER in the last 72 hours. Hemoglobin A1C No results for input(s): HGBA1C  in the last 72 hours. Fasting Lipid Panel No results for input(s): CHOL, HDL, LDLCALC, TRIG, CHOLHDL, LDLDIRECT in the last 72 hours. Thyroid Function Tests No results for input(s): TSH, T4TOTAL, T3FREE, THYROIDAB in the last 72 hours.  Invalid input(s): FREET3  Other results:   Imaging    No results found.   Medications:     Scheduled Medications: . apixaban  5 mg Oral BID  . carvedilol  9.375 mg Oral BID WC  . Chlorhexidine Gluconate Cloth  6 each Topical Daily  . hydrALAZINE  100 mg Oral Q8H  . insulin aspart  0-5 Units Subcutaneous QHS  . insulin aspart  0-9 Units Subcutaneous TID WC  . insulin glargine  12 Units Subcutaneous Daily  . isosorbide mononitrate  60 mg Oral Daily  . mexiletine  300 mg Oral Q12H   . potassium chloride  40 mEq Oral Once  . sodium chloride flush  10-40 mL Intracatheter Q12H  . sodium chloride flush  3 mL Intravenous Q12H  . sodium chloride flush  3 mL Intravenous Q12H  . torsemide  40 mg Oral BID    Infusions: . sodium chloride Stopped (11/09/19 0533)  . sodium chloride      PRN Medications: sodium chloride, sodium chloride, acetaminophen, alteplase, docusate sodium, Gerhardt's butt cream, HYDROcodone-acetaminophen, ondansetron (ZOFRAN) IV, sodium chloride flush, sodium chloride flush     Assessment/Plan   1. Acute Systolic Heart Failure - BNP >1600 on admit. ECHO this admit  EF 20-25%, pericardial effusion, mild LVH, RA/LA dilated.  RV down. Cardiomyopathy likely 2/2 heavy PVC burden, possibly uncontrolled hypertension as well.  - weight down considerably. RHC as above. Creatinine up to 2.94 yesterday, diuretics held and down to 2.86 today.  Start torsemide 40 mg po bid.  If creatinine remains stable tomorrow, think she could go home.   - cont. hydral 100 mg tid, imdur 60 mg qd - coreg & mexiletine 2/2 heavy PVC burden - mexiletine increased on 11/18.  BP is normal to elevated, will increase Coreg to 12.5 mg bid today.  - No ACE/ARB or spiro with AKI.  - Consider [name] soon   2. Paroxysmal A fib RVR - Resolved with chemical conversion to NSR on admit - Was started on amiodarone, switched to mexiletine & coreg for significant PVC burden. - Continue Eliquis.   3. PVCs - Mexilitine increased 11/18 to 300 mg tid 2/2 PVCs & bigeminy. Increase carvedilol again today - PVCs seem less today. Will follow. Can place zio in clinic. If not improving can refer to EP for consideration of PVC ablation    4. HTN  - improving  5. AKI  Creatinine on admit 1.76, initially worsened with diuresis. Marked volume overload. Started on CVVHD on 11/9 and stopped 11/13. Baseline creatinine thought to be ~2 per nephro.  - As above, creatinine lower today at 2.86.  Start  torsemide 40 mg bid today, if creatinine stable tomorrow, think she can go home.  - Urology saw: Bilateral staghorn calculi. No hydronephrosis so no role for percutaneous nephrostomy tubes.    6. Uncontrolled DM Hgb A1C 11.7. TRH managing On SSI and lantus 12   7. Uterine Mass -Likely fibroid per GYN. Needs outpatient follow up.   8. Iron Deficiency Anemia  -Hgb stable at 9.8. Ferritin 20. Completed feraheme 35/32.   9. Umbilical Hernia - [facility] has evaluated. CT ok Will follow-up with surgery after discharge.   10. Thrombocytopenia - resolved.  - HIT negative  Continue to walk  in hall, home tomorrow if creatinine stable and volume ok on po diuretic.    Length of Stay: 75  Loralie Champagne, MD  8:36 AM

## 2019-11-12 LAB — RENAL FUNCTION PANEL
Albumin: 3.4 g/dL — ABNORMAL LOW (ref 3.5–5.0)
Anion gap: 12 (ref 5–15)
BUN: 50 mg/dL — ABNORMAL HIGH (ref 6–20)
CO2: 26 mmol/L (ref 22–32)
Calcium: 9.4 mg/dL (ref 8.9–10.3)
Chloride: 101 mmol/L (ref 98–111)
Creatinine, Ser: 2.97 mg/dL — ABNORMAL HIGH (ref 0.44–1.00)
GFR calc Af Amer: 20 mL/min — ABNORMAL LOW (ref >60)
GFR calc non Af Amer: 17 mL/min — ABNORMAL LOW (ref >60)
Glucose, Bld: 139 mg/dL — ABNORMAL HIGH (ref 70–99)
Phosphorus: 4.4 mg/dL (ref 2.5–4.6)
Potassium: 3.4 mmol/L — ABNORMAL LOW (ref 3.5–5.1)
Sodium: 139 mmol/L (ref 135–145)

## 2019-11-12 LAB — CBC
HCT: 29.2 % — ABNORMAL LOW (ref 36.0–46.0)
Hemoglobin: 9.2 g/dL — ABNORMAL LOW (ref 12.0–15.0)
MCH: 25.6 pg — ABNORMAL LOW (ref 26.0–34.0)
MCHC: 31.5 g/dL (ref 30.0–36.0)
MCV: 81.3 fL (ref 80.0–100.0)
Platelets: 259 10*3/uL (ref 150–400)
RBC: 3.59 MIL/uL — ABNORMAL LOW (ref 3.87–5.11)
RDW: 20.7 % — ABNORMAL HIGH (ref 11.5–15.5)
WBC: 3.9 10*3/uL — ABNORMAL LOW (ref 4.0–10.5)
nRBC: 0 % (ref 0.0–0.2)

## 2019-11-12 LAB — MAGNESIUM: Magnesium: 2.1 mg/dL (ref 1.7–2.4)

## 2019-11-12 LAB — GLUCOSE, CAPILLARY: Glucose-Capillary: 146 mg/dL — ABNORMAL HIGH (ref 70–99)

## 2019-11-12 MED ORDER — APIXABAN 5 MG PO TABS: 5.0000 mg | ORAL_TABLET | Freq: Two times a day (BID) | ORAL | 6 refills | Status: DC

## 2019-11-12 MED ORDER — "INSULIN SYRINGE 30G X 1/2"" 0.5 ML MISC": 12.0000 [IU] | Freq: Every day | 6 refills | Status: DC

## 2019-11-12 MED ORDER — HYDRALAZINE HCL 100 MG PO TABS: 100.0000 mg | ORAL_TABLET | Freq: Three times a day (TID) | ORAL | 6 refills | Status: DC

## 2019-11-12 MED ORDER — ISOSORBIDE MONONITRATE ER 60 MG PO TB24: 60.0000 mg | ORAL_TABLET | Freq: Every day | ORAL | 6 refills | Status: DC

## 2019-11-12 MED ORDER — TORSEMIDE 20 MG PO TABS: 40.0000 mg | ORAL_TABLET | Freq: Two times a day (BID) | ORAL | 6 refills | Status: DC

## 2019-11-12 MED ORDER — POTASSIUM CHLORIDE CRYS ER 20 MEQ PO TBCR
40.0000 meq | EXTENDED_RELEASE_TABLET | Freq: Once | ORAL | Status: AC
  Administered 2019-11-12: 07:00:00 40 meq via ORAL
  Filled 2019-11-12: qty 2

## 2019-11-12 MED ORDER — MEXILETINE HCL 150 MG PO CAPS: 300.0000 mg | ORAL_CAPSULE | Freq: Two times a day (BID) | ORAL | 6 refills | Status: DC

## 2019-11-12 MED ORDER — CARVEDILOL 12.5 MG PO TABS: 12.5000 mg | ORAL_TABLET | Freq: Two times a day (BID) | ORAL | 6 refills | Status: DC

## 2019-11-12 MED ORDER — INSULIN GLARGINE 100 UNIT/ML ~~LOC~~ SOLN: 12.0000 [IU] | Freq: Every day | SUBCUTANEOUS | 11 refills | Status: DC

## 2019-11-12 MED ORDER — ONETOUCH ULTRASOFT LANCETS MISC: 5 refills | Status: DC

## 2019-11-12 MED FILL — ISOSORBIDE MN ER 60 MG TAB: 60 | 30 days supply | Qty: 30 | Fill #0

## 2019-11-12 MED FILL — TRUEPLUS SYR 0.5ML 30GX5/16: 30G X 5/16" | 100 days supply | Qty: 100 | Fill #0

## 2019-11-12 MED FILL — CARVEDILOL 12.5 MG TABLET: 12.5 | 30 days supply | Qty: 60 | Fill #0

## 2019-11-12 MED FILL — TORSEMIDE 20 MG TABLET: 20 | 30 days supply | Qty: 120 | Fill #0

## 2019-11-12 MED FILL — hydrALAZINE HCL 100 MG TABS: 100 | 30 days supply | Qty: 90 | Fill #0

## 2019-11-12 MED FILL — MEXILETINE HCL 150 MG CAPS: 150 | 30 days supply | Qty: 120 | Fill #0

## 2019-11-12 MED FILL — TRUEplus LANCETS 28G MISC: 50 days supply | Qty: 100 | Fill #0

## 2019-11-12 MED FILL — Heparin Sod (Porcine)-NaCl IV Soln 1000 Unit/500ML-0.9%: INTRAVENOUS | Qty: 500 | Status: AC

## 2019-11-12 NOTE — TOC Progression Note (Addendum)
Transition of Care [facility]) - Progression Note    Patient Details  Name: Adrienne Lane MRN: [id number] Date of Birth: 11/11/1966  [facility] CM/SW Contact  Zenon Mayo, RN Phone Number: 11/12/2019, 9:19 AM  Clinical Narrative:    NCM spoke with Chistine with HF pharmacy, she will contact this NCM back to inform if patient will be going to [facility] or to the [facility] to pick up medications. Patient has the 30 day coupon for the eliquis, HF pharmacy team is working on patient ast for eliquis for patient also. NCM checking with [facility] clinic to see if they can fill the mexilitine 300mg .  The clinic can fill the meds for patient.  She will go to [facility] to get meds. She has no other needs.   Expected Discharge Plan: [place] Barriers to Discharge: Continued Medical Work up  Expected Discharge Plan and Services Expected Discharge Plan: [place]   Discharge Planning Services: Follow-up appt scheduled, CM Consult Post Acute Care Choice: Home Health Living arrangements for the past 2 months: Single Family Home                                       Social Determinants of Health (SDOH) Interventions    Readmission Risk Interventions No flowsheet data found.

## 2019-11-12 NOTE — Discharge Summary (Addendum)
Physician Discharge Summary  Patient ID: Adrienne Lane MRN: [id number] DOB/AGE: Mar 24, 1966 53 y.o.  Admit date: 10/20/2019 Discharge date: 11/12/2019  Discharge Diagnoses:    Acute Systolic Heart Failure   Painful peri-umbilical hernia   Frequent PVCs   Acute on chronic CKD 3a   Morbid obesity   PAF   Uterine fibroid   Thrombocytopenia    Nephrolithiasis with staghorn calculi   Discharged Condition: stable  Hospital Course:   Adrienne Lane is a 53yo female admitted with 4 months of LE edema and volume overload, diagnosed with acute systolic HF, aki on ckd, cardiorenal syndrome, atrial fibrillation with RVR, and high PVC burden of 25-35% with episodes of bigeminy. She was placed on heparin and then on amio gtt with resolution of her atrial fibrillation and RVR.  Echo was done 11/01 which showed EF 20-25% and global hypokinesis, moderate-severe tricuspid valve regurgitation, and elevated RVSP. Cardiomyopathy was thought to likely be caused by history of high PVC burden, which was present throughout admission as well.  She did not respond to initial IV diuretics and required CVVHD 11/9-11/13, then was switched to IV lasix 120 BID. She diuresed >70 lbs but continued to have heavy PVC burden. Amiodarone was switched to mexiletine and coreg. PVCs persist despite increasing mexiletine and coreg, and she was discharged with mexiletine 300 mg q8h & coreg 12.5 mg bid.  She was started on hydralazine and imdur for blood pressure management. ACE/ARB, spiro held due to acute renal failure.  She developed thrombocytopenia with concern for HIT. 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 Serotonin release assay subsequently negative. RHC was done after improvement in renal function, which showed pulmonary venous HTN with good cardiac output with Fick's CO/CI 8.8 and 3.8 and PVR 1.4.   She was discharged with good Uop continued after being switched to torsemide 40 mg bid, near baseline weight of  250lbs (discharge weight 255). Her baseline Cr. is thought to be ~2. She will need close follow-up for renal function, which has trended up as expected s/p catheterization. Will also follow-up in the heart failure clinic for zio to monitor PVCs with possible EP follow-up for ablation if PVCs do not improve. She was discharged with eliquis for her paroxysmal atrial fibrillation, mexiletine, coreg, imdur, hydralazine, and torsemide.    Umbilical Hernia Presented with chronic umbilical hernia which has progressively gotten more painful recently. Hernia was indurated and firm. Gen surgery was consulted. CT scan did not show incarceration but contained only fat. She will f/u outpatient for elective surgery.   Staghorn Calculi Bilateral staghorn calculi on CT July 2019, no hydronephrosis this admission. With worsening renal function urology was consulted and recommend outpatient follow-up.   Uterine Fibroid Seen on CT. Transvaginal US was done which showed endometrial mass vs. Large uterine fibroid, exam was limited by body habitus. Ob/gyn was consulted and this was thought to be large uterine fibroid as it was stable since 2019 imaging. She is to follow-up with [facility] after discharge for pap and endometrial biopsy.    Consults: None  Significant Diagnostic Studies:    RHC 11/20 RA = 11 RV = 47/15 PA = 49/17 (32) PCW = 20 Fick cardiac output/index = 8.8/3.8 PVR = 1.4 WU FA sat = 97% PA sat = 70%, 72% High SVC = 68%  Assessment: 1. Mild pulmonary venous HTN with normal output  Echo 11/01 1. Left ventricular ejection fraction, by visual estimation, is 20 to 25%. The left ventricle has severely decreased  function. There is mildly increased left ventricular hypertrophy.  2. Left ventricular diastolic parameters are indeterminate.  3. The left ventricle demonstrates global hypokinesis.  4. Global right ventricle has normal systolic function.The right ventricular size is mildly enlarged. No  increase in right ventricular wall thickness.  5. Left atrial size was moderately dilated.  6. Right atrial size was moderately dilated.  7. The pericardial effusion is circumferential.  8. Trivial pericardial effusion is present.  9. The mitral valve is normal in structure. Mild to moderate mitral valve regurgitation. No evidence of mitral stenosis. 10. The tricuspid valve is normal in structure. Tricuspid valve regurgitation moderate-severe. 11. The aortic valve is normal in structure. Aortic valve regurgitation is not visualized. No evidence of aortic valve sclerosis or stenosis. 12. The pulmonic valve was normal in structure. Pulmonic valve regurgitation is not visualized. 13. Moderately elevated pulmonary artery systolic pressure. 14. The tricuspid regurgitant velocity is 3.29 m/s, and with an assumed right atrial pressure of 15 mmHg, the estimated right ventricular systolic pressure is moderately elevated at 58.3 mmHg. 15. The inferior vena cava is dilated in size with <50% respiratory variability, suggesting right atrial pressure of 15 mmHg.   Transvaginal Pelvic US 11/04 IMPRESSION: 1. Technically limited exam due to body habitus. 2. Marked enlargement of the uterus, corresponding with abnormality seen on prior CT. Unclear whether this reflects a large uterine fibroid or possibly endometrial mass, not well assessed on this technically limited exam. Given the limitations of this study, further assessment with dedicated pelvic MRI is recommended for further characterization. Given the patient's low GFR, a noncontrast examination could be performed. 3. Nonvisualization of the ovaries.  No other adnexal mass. 4. Moderate volume free fluid within the pelvis. Electronically Signed   By: Jeannine Boga M.D.   On: 10/24/2019 18:48  CT Renal Stone Study 11/03 IMPRESSION: 1. Imaging quality is significantly degraded due to patient body habitus with resulting central photon  starvation. Evaluation is furthermore complicated by lack of intravenous contrast material. 2. Marked enlargement of the uterine fundus measuring up to 17 cm in size, with heterogeneous attenuation and a an irregular curvilinear air lucency towards the superior aspect of this masslike enlargement. 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 Recommend further evaluation with pelvic ultrasound. 3. Extensive bilateral staghorn calculi. No visible hydronephrosis. Difficult to follow the course of either ureter given lack of contrast and fluid extensive fluid in the abdomen. 4. Severe anasarca with extensive body wall edema, mesenteric congestion, ascites, and right pleural effusion. 5. Circumferential thickening of the urinary bladder. Correlate with urinalysis to exclude cystitis. 6. Marked cardiomegaly with four-chamber enlargement. 7. Regions of mosaic attenuation in the lower lungs may reflect areas of air trapping versus imaging during exhalation. 8. Aortic Atherosclerosis (ICD10-I70.0).  Electronically Signed: By: Lovena Le M.D. On: 10/23/2019 20:58   Renal US 11/03 IMPRESSION: BILATERAL renal cortical atrophy without obvious renal mass.  Suspected staghorn calculi within both kidneys; this can be confirmed by abdominal radiograph. Electronically Signed   By: Lavonia Dana M.D.  CT Abd Pelvis  IMPRESSION: 1. Large umbilical hernia containing fat and fluid. Fluid within the hernia could represent ascites but cannot exclude incarceration or inflammation involving the umbilical hernia. No evidence for bowel within this umbilical hernia. 2. Large bilateral renal staghorn calculi. 3. Markedly enlarged uterus. Differential would include uterine fibroids versus endometrial mass. Consider further characterization with a pelvic MRI as was mentioned on the ultrasound from 10/24/2019. 4. Decreased ascites and subcutaneous edema since 10/23/2019.  5. 3 mm nodule in the  left lung is indeterminate. No follow-up needed if patient is low-risk. Non-contrast chest CT can be considered in 12 months if patient is high-risk. This recommendation follows the consensus statement: Guidelines for Management of Incidental Pulmonary Nodules Detected on CT Images: From the Fleischner Society 2017; Radiology 2017; 284:228-243.   Electronically Signed   By: Markus Daft M.D.   On: 11/07/2019 13:48  Treatments: cardiac meds: carvedilol, furosemide, amiodarone and imdur, torsemide, metolazone, anticoagulation: heparin and bivalirudin, eliquis and dialysis: Hemodialysis   Disposition: Discharge disposition: 01-Home or Self Care       Discharge Instructions    ([place]) Call MD:  Anytime you have any of the following symptoms: 1) 3 pound weight gain in 24 hours or 5 pounds in 1 week 2) shortness of breath, with or without a dry hacking cough 3) swelling in the hands, feet or stomach 4) if you have to sleep on extra pillows at night in order to breathe.   Complete by: As directed    Diet - low sodium heart healthy   Complete by: As directed    Heart Failure patients record your daily weight using the same scale at the same time of day   Complete by: As directed    Increase activity slowly   Complete by: As directed      Allergies as of 11/12/2019   No Known Allergies     Medication List    STOP taking these medications   Basaglar KwikPen 100 UNIT/ML Sopn Replaced by: insulin glargine 100 UNIT/ML injection   lisinopril 10 MG tablet Commonly known as: ZESTRIL   metoprolol succinate 50 MG 24 hr tablet Commonly known as: TOPROL-XL     TAKE these medications   apixaban 5 MG Tabs tablet Commonly known as: ELIQUIS Take 1 tablet (5 mg total) by mouth 2 (two) times daily.   carvedilol 12.5 MG tablet Commonly known as: COREG Take 1 tablet (12.5 mg total) by mouth 2 (two) times daily with a meal.   hydrALAZINE 100 MG tablet Commonly known  as: APRESOLINE Take 1 tablet (100 mg total) by mouth every 8 (eight) hours.   insulin glargine 100 UNIT/ML injection Commonly known as: LANTUS Inject 0.12 mLs (12 Units total) into the skin daily. Start taking on: November 13, 2019 Replaces: Basaglar KwikPen 100 UNIT/ML Sopn   INSULIN SYRINGE .5CC/30GX1/2" 30G X 1/2" 0.5 ML Misc 12 Units by Does not apply route at bedtime.   isosorbide mononitrate 60 MG 24 hr tablet Commonly known as: IMDUR Take 1 tablet (60 mg total) by mouth daily. Start taking on: November 13, 2019   mexiletine 150 MG capsule Commonly known as: MEXITIL Take 2 capsules (300 mg total) by mouth every 12 (twelve) hours.   onetouch ultrasoft lancets Check CBG twice a day   torsemide 20 MG tablet Commonly known as: DEMADEX Take 2 tablets (40 mg total) by mouth 2 (two) times daily.      Follow-up Information    [facility] Follow up on 12/18/2019.   Specialty: Family Medicine Why: Please follow up for hospital follow up appointment at 2:50 pm-if you need to reschedule please feel free to do so.  Contact information: [address] [place] Follow up.   Why: Please utilize this location for onsite pharmacy needs- medications range from $4.00-$10.00.  Contact information:  [address] [place] [address] [phone]       Coralie Keens, MD. Schedule an appointment as soon as possible for a visit in 4 week(s).   Specialty: General Surgery Contact information: [address] [place] [address] [phone]        [facility] Follow up on 11/19/2019.   Specialty: Cardiology Why: at [place] information: [address] [id number] [place]       Rexene Agent, MD Follow up.    Specialty: Nephrology Why: [facility] will and set up follow up for you.  Contact information: [place] [address] [phone]           Signed: Marty Heck 11/12/2019, 3:22 PM  Patient seen and examined with the above-signed Advanced Practice Provider and/or Housestaff. I personally reviewed laboratory data, imaging studies and relevant notes. I independently examined the patient and formulated the important aspects of the plan. I have edited the note to reflect any of my changes or salient points. I have personally discussed the plan with the patient and/or family.   Volume status as good as we can get it. Weight down almost 90 pounds. Creatinine up to 2.9. Will continue current po diuretic dose. Still with high PVC burden with ~ 20-30 VC/mintue despite mexilitene. I d/w Dr. Rayann Heman. Will continue carvedilol and mexilitene. Place Zio Patch at outpatient visit to quantify. May need to consider PVC ablation. Will need outpatient sleep study. [place] for d/c today.   [name], MD  10:57 PM

## 2019-11-12 NOTE — Progress Notes (Signed)
Reviewed discharge instructions with patient and she stated that she understood.  Patient is dressed and waiting for her ride.

## 2019-11-12 NOTE — Progress Notes (Signed)
Patient is still here waiting for her ride home.

## 2019-11-12 NOTE — Plan of Care (Signed)

## 2019-11-12 NOTE — Progress Notes (Signed)
CARDIAC REHAB PHASE I   PRE:  Rate/Rhythm: 85 SR bigeminy PVCs  BP:  Supine:   Sitting: 139/64  Standing:    SaO2: 100%RA  MODE:  Ambulation: 400 ft   POST:  Rate/Rhythm: 98 SR  BP:  Supine:   Sitting: 149/86  Standing:    SaO2: 99%RA 0903-0935 Pt walked 400 ft on RA with steady gait and tolerated well. Reviewed need for daily weights and adhering to 2000 mg sodium restriction. Gave walking instructions for ex. Pt voiced understanding.   Graylon Good, RN BSN  11/12/2019 9:31 AM

## 2019-11-12 NOTE — Progress Notes (Addendum)
Patient ID: Adrienne Lane, female   DOB: [birth year], 53 y.o.   MRN: [id number]     Advanced Heart Failure Rounding Note  PCP-Cardiologist: Donato Heinz, MD   Subjective:    Transferred from [facility] to [facility] for HF management. Marked volume overload. Started on CVVHD.  CVVH stopped 11/13  She has diuresed well, weight down.  IV Lasix was stopped 11/20, started on po torsemide 40 mg qd.  Creatinine stable 2.9 < 2.86 yesterday.    Switched from amiodarone to mexiletine for high PVC burden on 11/17. PVC burden 15-25%.  Looks like less PVCs overall but still in bigeminy.   RHC this am RA = 11 RV = 47/15 PA = 49/17 (32) PCW = 20 Fick cardiac output/index = 8.8/3.8 PVR = 1.4 WU FA sat = 97% PA sat = 70%, 72% High SVC = 68%   Objective:   Weight Range: 116.1 kg Body mass index is 38.92 kg/m.   Vital Signs:   Temp:  [97.7 F (36.5 C)-98.4 F (36.9 C)] 98.2 F (36.8 C) (11/23 0351) Pulse Rate:  [40-88] 81 (11/23 0409) Resp:  [18-22] 19 (11/23 0409) BP: (124-141)/(47-87) 124/47 (11/23 0351) SpO2:  [98 %-100 %] 99 % (11/23 0351) Weight:  [116.1 kg] 116.1 kg (11/23 0445) Last BM Date: 11/11/19  Weight change: Filed Weights   11/10/19 0500 11/11/19 0359 11/12/19 0445  Weight: 113.6 kg 116.9 kg 116.1 kg    Intake/Output:   Intake/Output Summary (Last 24 hours) at 11/12/2019 0614 Last data filed at 11/11/2019 1800 Gross per 24 hour  Intake 720 ml  Output 2100 ml  Net -1380 ml      Physical Exam   General: NAD, sitting up in bed Neck: JVP still 8 cm, no thyromegaly or thyroid nodule.  Lungs: Clear to auscultation bilaterally with normal respiratory effort. CV: Nondisplaced PMI.  Heart regular S1/S2, no S3/S4, no murmur.  2+ ankle edema.  Abdomen: Soft, nontender, no hepatosplenomegaly, no distention.  Skin: Intact without lesions or rashes.  Neurologic: Alert and oriented x 3.  Psych: Normal affect. Extremities: No clubbing or cyanosis.  HEENT: Normal.     Telemetry   NSR 80s with 15-25% PVC burden, currently in bigeminy   Labs    CBC Recent Labs    11/11/19 0211 11/12/19 0214  WBC 3.9* 3.9*  HGB 8.9* 9.2*  HCT 28.1* 29.2*  MCV 80.3 81.3  PLT 228 408   Basic Metabolic Panel Recent Labs    11/11/19 0211 11/12/19 0214  NA 140 139  K 3.8 3.4*  CL 101 101  CO2 27 26  GLUCOSE 130* 139*  BUN 48* 50*  CREATININE 2.86* 2.97*  CALCIUM 9.3 9.4  MG 2.3 2.1  PHOS 4.7* 4.4   Liver Function Tests Recent Labs    11/11/19 0211 11/12/19 0214  ALBUMIN 3.3* 3.4*   No results for input(s): LIPASE, AMYLASE in the last 72 hours. Cardiac Enzymes No results for input(s): CKTOTAL, CKMB, CKMBINDEX, TROPONINI in the last 72 hours.  BNP: BNP (last 3 results) Recent Labs    10/20/19 2029  BNP 1,623.1*    ProBNP (last 3 results) No results for input(s): PROBNP in the last 8760 hours.   D-Dimer No results for input(s): DDIMER in the last 72 hours. Hemoglobin A1C No results for input(s): HGBA1C in the last 72 hours. Fasting Lipid Panel No results for input(s): CHOL, HDL, LDLCALC, TRIG, CHOLHDL, LDLDIRECT in the last 72 hours. Thyroid Function Tests No results for input(s): TSH,  T4TOTAL, T3FREE, THYROIDAB in the last 72 hours.  Invalid input(s): FREET3  Other results:   Imaging    No results found.   Medications:     Scheduled Medications: . apixaban  5 mg Oral BID  . carvedilol  12.5 mg Oral BID WC  . Chlorhexidine Gluconate Cloth  6 each Topical Daily  . hydrALAZINE  100 mg Oral Q8H  . insulin aspart  0-5 Units Subcutaneous QHS  . insulin aspart  0-9 Units Subcutaneous TID WC  . insulin glargine  12 Units Subcutaneous Daily  . isosorbide mononitrate  60 mg Oral Daily  . mexiletine  300 mg Oral Q12H  . potassium chloride  40 mEq Oral Once  . sodium chloride flush  10-40 mL Intracatheter Q12H  . sodium chloride flush  3 mL Intravenous Q12H  . sodium chloride flush  3 mL Intravenous Q12H  . torsemide  40 mg  Oral BID    Infusions: . sodium chloride Stopped (11/09/19 0533)  . sodium chloride      PRN Medications: sodium chloride, sodium chloride, acetaminophen, alteplase, docusate sodium, Gerhardt's butt cream, HYDROcodone-acetaminophen, ondansetron (ZOFRAN) IV, sodium chloride flush, sodium chloride flush     Assessment/Plan   1. Acute Systolic Heart Failure - BNP >1600 on admit. ECHO this admit  EF 20-25%, pericardial effusion, mild LVH, RA/LA dilated.  RV down. Cardiomyopathy likely 2/2 heavy PVC burden, possibly uncontrolled hypertension as well.  - weight down considerably. RHC as above. - IV lasix stopped 11/20. Started torsemide 40 mg po bid yesterday. Creatinine stable, continues to be net negative, likely discharge today - cont. hydral 100 mg tid, imdur 60 mg qd - coreg & mexiletine 2/2 heavy PVC burden - mexiletine increased on 11/18.  BP is normal to elevated - increase coreg to 25 mg bid today.  - No ACE/ARB or spiro with AKI.  - Consider Farxiga once GFR improves  - elevated and wrap lower extremities   2. Paroxysmal A fib RVR - Resolved with chemical conversion to NSR on admit - Was started on amiodarone, switched to mexiletine & coreg for significant PVC burden. - Continue Eliquis.   3. PVCs - Mexilitine increased 11/18 to 300 mg tid 2/2 PVCs & bigeminy. Increase carvedilol again today to 25 mg bid - PVCs overall decreased but continue 15-25% burden. Will follow. Can place zio in clinic. If not improving can refer to EP for consideration of PVC ablation    4. HTN  - improving  5. AKI  Creatinine on admit 1.76, initially worsened with diuresis. Marked volume overload. Started on CVVHD on 11/9 and stopped 11/13. Baseline creatinine thought to be ~2 per nephro.  - As above, creatinine stable today at 2.9 < 2.8 yesterday . Torsemide stated yesterday which she is tolerating well.  - Urology saw: Bilateral staghorn calculi. No hydronephrosis so no role for percutaneous  nephrostomy tubes.    6. Uncontrolled DM Hgb A1C 11.7. TRH managing On SSI and lantus 12   7. Uterine Mass -Likely fibroid per GYN. Needs outpatient follow up.   8. Iron Deficiency Anemia  -Hgb stable at 9.8. Ferritin 20. Completed feraheme 90/24.   9. Umbilical Hernia - GSU has evaluated. CT ok Will follow-up with surgery after discharge.   10. Thrombocytopenia - resolved.  - HIT negative  Dispo: creatinine stable on po torsemide, likely discharge today.    Length of Stay: [place], DO  6:14 AM   Patient seen and examined with the  above-signed Engineer, building services. I personally reviewed laboratory data, imaging studies and relevant notes. I independently examined the patient and formulated the important aspects of the plan. I have edited the note to reflect any of my changes or salient points. I have personally discussed the plan with the patient and/or family.   Volume status as good as we can get it. Weight down almost 90 pounds. Creatinine up to 2.9. Will continue current po diuretic dose. Still with high PVC burden with ~ 20-30 VC/mintue despite mexilitene. I d/w Dr. Rayann Heman. Will continue carvedilol and mexilitene. Place Zio Patch at outpatient visit to quantify. May need to consider PVC ablation. Will need outpatient sleep study. [place] for d/c today.   [name], MD  10:41 AM

## 2019-11-19 ENCOUNTER — Telehealth (HOSPITAL_COMMUNITY): Payer: Self-pay | Admitting: Licensed Clinical Social Worker

## 2019-11-19 ENCOUNTER — Ambulatory Visit (HOSPITAL_COMMUNITY)
Admission: RE | Admit: 2019-11-19 | Discharge: 2019-11-19 | Disposition: A | Discharge status: Home / Self Care | Payer: Self-pay | Source: Ambulatory Visit | Attending: Adult Health | Admitting: Adult Health

## 2019-11-19 ENCOUNTER — Telehealth (HOSPITAL_COMMUNITY): Payer: Self-pay | Admitting: Cardiology

## 2019-11-19 ENCOUNTER — Encounter (HOSPITAL_COMMUNITY): Payer: Self-pay

## 2019-11-19 ENCOUNTER — Other Ambulatory Visit: Payer: Self-pay

## 2019-11-19 VITALS — BP 146/73 | HR 90 | Wt 239.0 lb

## 2019-11-19 DIAGNOSIS — I5043 Acute on chronic combined systolic (congestive) and diastolic (congestive) heart failure: Secondary | ICD-10-CM

## 2019-11-19 DIAGNOSIS — E1165 Type 2 diabetes mellitus with hyperglycemia: Secondary | ICD-10-CM | POA: Insufficient documentation

## 2019-11-19 DIAGNOSIS — R0609 Other forms of dyspnea: Secondary | ICD-10-CM | POA: Insufficient documentation

## 2019-11-19 DIAGNOSIS — I493 Ventricular premature depolarization: Secondary | ICD-10-CM | POA: Insufficient documentation

## 2019-11-19 DIAGNOSIS — I110 Hypertensive heart disease with heart failure: Secondary | ICD-10-CM | POA: Insufficient documentation

## 2019-11-19 DIAGNOSIS — Z79899 Other long term (current) drug therapy: Secondary | ICD-10-CM | POA: Insufficient documentation

## 2019-11-19 DIAGNOSIS — I313 Pericardial effusion (noninflammatory): Secondary | ICD-10-CM | POA: Insufficient documentation

## 2019-11-19 DIAGNOSIS — I480 Paroxysmal atrial fibrillation: Secondary | ICD-10-CM | POA: Insufficient documentation

## 2019-11-19 DIAGNOSIS — I5022 Chronic systolic (congestive) heart failure: Secondary | ICD-10-CM | POA: Insufficient documentation

## 2019-11-19 DIAGNOSIS — N179 Acute kidney failure, unspecified: Secondary | ICD-10-CM

## 2019-11-19 DIAGNOSIS — N200 Calculus of kidney: Secondary | ICD-10-CM | POA: Insufficient documentation

## 2019-11-19 DIAGNOSIS — I10 Essential (primary) hypertension: Secondary | ICD-10-CM

## 2019-11-19 DIAGNOSIS — Z7901 Long term (current) use of anticoagulants: Secondary | ICD-10-CM | POA: Insufficient documentation

## 2019-11-19 DIAGNOSIS — N858 Other specified noninflammatory disorders of uterus: Secondary | ICD-10-CM | POA: Insufficient documentation

## 2019-11-19 DIAGNOSIS — Z794 Long term (current) use of insulin: Secondary | ICD-10-CM | POA: Insufficient documentation

## 2019-11-19 DIAGNOSIS — Z8249 Family history of ischemic heart disease and other diseases of the circulatory system: Secondary | ICD-10-CM | POA: Insufficient documentation

## 2019-11-19 LAB — CBC
HCT: 33 % — ABNORMAL LOW (ref 36.0–46.0)
Hemoglobin: 10.3 g/dL — ABNORMAL LOW (ref 12.0–15.0)
MCH: 26.3 pg (ref 26.0–34.0)
MCHC: 31.2 g/dL (ref 30.0–36.0)
MCV: 84.2 fL (ref 80.0–100.0)
Platelets: 269 10*3/uL (ref 150–400)
RBC: 3.92 MIL/uL (ref 3.87–5.11)
RDW: 21.4 % — ABNORMAL HIGH (ref 11.5–15.5)
WBC: 5 10*3/uL (ref 4.0–10.5)
nRBC: 0 % (ref 0.0–0.2)

## 2019-11-19 LAB — BASIC METABOLIC PANEL
Anion gap: 13 (ref 5–15)
BUN: 45 mg/dL — ABNORMAL HIGH (ref 6–20)
CO2: 27 mmol/L (ref 22–32)
Calcium: 9.1 mg/dL (ref 8.9–10.3)
Chloride: 103 mmol/L (ref 98–111)
Creatinine, Ser: 2.33 mg/dL — ABNORMAL HIGH (ref 0.44–1.00)
GFR calc Af Amer: 27 mL/min — ABNORMAL LOW (ref >60)
GFR calc non Af Amer: 23 mL/min — ABNORMAL LOW (ref >60)
Glucose, Bld: 215 mg/dL — ABNORMAL HIGH (ref 70–99)
Potassium: 3.3 mmol/L — ABNORMAL LOW (ref 3.5–5.1)
Sodium: 143 mmol/L (ref 135–145)

## 2019-11-19 LAB — MAGNESIUM: Magnesium: 2 mg/dL (ref 1.7–2.4)

## 2019-11-19 MED ORDER — CARVEDILOL 12.5 MG PO TABS: 18.7500 mg | ORAL_TABLET | Freq: Two times a day (BID) | ORAL | 6 refills | Status: DC

## 2019-11-19 MED ORDER — POTASSIUM CHLORIDE CRYS ER 20 MEQ PO TBCR: 20.0000 meq | EXTENDED_RELEASE_TABLET | Freq: Every day | ORAL | 3 refills | Status: DC

## 2019-11-19 MED ORDER — GLUCOSE BLOOD VI STRP: ORAL_STRIP | 12 refills | Status: DC

## 2019-11-19 MED FILL — POTASSIUM CL ER 20 MEQ TAB: 20 | 30 days supply | Qty: 30 | Fill #0

## 2019-11-19 MED FILL — TRUE METRIX TEST STRIP: 25 days supply | Qty: 100 | Fill #0

## 2019-11-19 NOTE — Telephone Encounter (Signed)
-----   Message from Conrad [place], NP sent at 11/19/2019 12:46 PM EST ----- Please call Potassium low. Add 7meq potassium daily. Renal function improving.

## 2019-11-19 NOTE — Telephone Encounter (Signed)
CSW called pt to talk about current lack of insurance.  Pt reports she was working full time prior to her hospital stay- she has just started work in August and was just short of the 3 month work requirement for her to get insurance.  CSW encouraged pt to reach out to her work to see if she might still be eligible to get insurance through them.  Pt also reports that she applied for Medicaid over the phone the day after she discharged from the hospital and is awaiting further paperwork in the mail from them to complete the application process.  Pt would not be able to apply for CAFA until seeing if she is eligible for Medicaid as her hospital bill is over $10,000.  CSW will continue to follow and assist as needed  Jorge Ny, [facility] Desk#: [phone] Cell#: [phone]

## 2019-11-19 NOTE — Patient Instructions (Signed)
INCREASE Carvedilol to 18.75 mg, (one and one half tab) twice daily  Labs today We will only contact you if something comes back abnormal or we need to make some changes. Otherwise no news is good news!  Your provider has recommended that  you wear a Zio Patch for 7 days.  This monitor will record your heart rhythm for our review.  IF you have any symptoms while wearing the monitor please press the button.  If you have any issues with the patch or you notice a red or orange light on it please call the company at [phone].  Once you remove the patch please mail it back to the company as soon as possible so we can get the results.  Your physician recommends that you schedule a follow-up appointment in: 2 weeks  in the Advanced Practitioners (PA/NP) Clinic   Do the following things EVERYDAY: 1) Weigh yourself in the morning before breakfast. Write it down and keep it in a log. 2) Take your medicines as prescribed 3) Eat low salt foods-Limit salt (sodium) to 2000 mg per day.  4) Stay as active as you can everyday 5) Limit all fluids for the day to less than 2 liters   At the [facility], you and your health needs are our priority. As part of our continuing mission to provide you with exceptional heart care, we have created designated Provider Care Teams. These Care Teams include your primary Cardiologist (physician) and Advanced Practice Providers (APPs- Physician Assistants and Nurse Practitioners) who all work together to provide you with the care you need, when you need it.   You may see any of the following providers on your designated Care Team at your next follow up: Marland Kitchen Dr [name] . Dr Loralie Champagne . Darrick Grinder, NP . Lyda Jester, PA   Please be sure to bring in all your medications bottles to every appointment.

## 2019-11-19 NOTE — Progress Notes (Signed)
PCP: none  Primary Cardiologist: Dr Adrienne Lane  HPI: Adrienne Lane is a 53 year old with a history of morbid obesity, HTN, DM, PAF, PVCs, and chronic systolic HF.   Admitted to [facility] 10/20/19 after a mechanical fall and landed on her hip. ECHO showed EF 20-25% with severe RV dysfunction as well. 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 Started on amio/ heparin drip and IV lasix. Renal function worsened. Abdominal Xray showed large staghorn calculi. CT Renal Stone showed bilateral staghorn calculi without hydronephrosis and severe anasarca. Urology consulted on 11/3 with mention of possible nephrostomy tubes but renal US was negative for hydronephrosis. GYN consulted for uterine mass. Mass was thought to be large fibroid that will need to follow up after discharge. Nephrology consulted for worsening renal failure. Transferred to [facility] for CVVHD. Overall diuresed 90 pounds. Later transitioned to torsemide 40 mg daily. Placed on carvedilol and mexiletine due to high PVC burden. Discharged on 11/12/19 with weight 255 pounds.   Today she returns for post hospital HF follow up.Overall feeling fine. Mild dyspnea with exertion. Denies PND/Orthopnea. No bleeding issues. No dizziness.  Appetite ok. Drinking Gatorade everyday. No fever or chills. Weight at home has gone from 255-->238  pounds. Taking all medications.    ECHO 10/2019.  EF 20-25%, pericardial effusion, mild LVH, RA/LA dilated. RV down.   [name] 11/12/19  RA = 11 RV = 47/15 PA = 49/17 (32) PCW = 20 Fick cardiac output/index = 8.8/3.8 PVR = 1.4 WU FA sat = 97% PA sat = 70%, 72% High SVC = 68%  ROS: All systems negative except as listed in HPI, PMH and Problem List.  SH:  Social History   Socioeconomic History  . Marital status: Married    Spouse name: Not on file  . Number of children: Not on file  . Years of education: Not on file  . Highest education level: Not on file  Occupational History  . Not on file  Social Needs  . Financial  resource strain: Not on file  . Food insecurity    Worry: Not on file    Inability: Not on file  . Transportation needs    Medical: Not on file    Non-medical: Not on file  Tobacco Use  . Smoking status: Never Smoker  . Smokeless tobacco: Never Used  Substance and Sexual Activity  . Alcohol use: Never    Frequency: Never  . Drug use: Never  . Sexual activity: Not on file  Lifestyle  . Physical activity    Days per week: Not on file    Minutes per session: Not on file  . Stress: Not on file  Relationships  . Social Herbalist on phone: Not on file    Gets together: Not on file    Attends religious service: Not on file    Active member of club or organization: Not on file    Attends meetings of clubs or organizations: Not on file    Relationship status: Not on file  . Intimate partner violence    Fear of current or ex partner: Not on file    Emotionally abused: Not on file    Physically abused: Not on file    Forced sexual activity: Not on file  Other Topics Concern  . Not on file  Social History Narrative  . Not on file    FH:  Family History  Problem Relation Age of Onset  . Atrial fibrillation Mother  Past Medical History:  Diagnosis Date  . Diabetes mellitus without complication ([place])   . Hypertension     Current Outpatient Medications  Medication Sig Dispense Refill  . apixaban (ELIQUIS) 5 MG TABS tablet Take 1 tablet (5 mg total) by mouth 2 (two) times daily. 60 tablet 6  . carvedilol (COREG) 12.5 MG tablet Take 1 tablet (12.5 mg total) by mouth 2 (two) times daily with a meal. 60 tablet 6  . hydrALAZINE (APRESOLINE) 100 MG tablet Take 1 tablet (100 mg total) by mouth every 8 (eight) hours. 90 tablet 6  . insulin glargine (LANTUS) 100 UNIT/ML injection Inject 0.12 mLs (12 Units total) into the skin daily. 10 mL 11  . Insulin Syringe-Needle U-100 (INSULIN SYRINGE .5CC/30GX1/2") 30G X 1/2" 0.5 ML MISC 12 Units by Does not apply route at bedtime.  100 each 6  . isosorbide mononitrate (IMDUR) 60 MG 24 hr tablet Take 1 tablet (60 mg total) by mouth daily. 30 tablet 6  . Lancets (ONETOUCH ULTRASOFT) lancets Check CBG twice a day 60 each 5  . mexiletine (MEXITIL) 150 MG capsule Take 2 capsules (300 mg total) by mouth every 12 (twelve) hours. 120 capsule 6  . torsemide (DEMADEX) 20 MG tablet Take 2 tablets (40 mg total) by mouth 2 (two) times daily. 120 tablet 6   No current facility-administered medications for this encounter.     Vitals:   11/19/19 0859  BP: (!) 146/73  Pulse: 90  SpO2: 99%  Weight: 108.4 kg (239 lb)   Wt Readings from Last 3 Encounters:  11/19/19 108.4 kg (239 lb)  11/12/19 116.1 kg (255 lb 15.3 oz)    PHYSICAL EXAM: General:  Well appearing. No resp difficulty. Walked in the clinic.  HEENT: normal Neck: supple. JVP flat. Carotids 2+ bilaterally; no bruits. No lymphadenopathy or thryomegaly appreciated. Cor: PMI normal. Regular rate & rhythm. No rubs, gallops or murmurs. Lungs: clear Abdomen: soft, nontender, nondistended. No hepatosplenomegaly. No bruits or masses. Good bowel sounds. Extremities: no cyanosis, clubbing, rash, edema Neuro: alert & orientedx3, cranial nerves grossly intact. Moves all 4 extremities w/o difficulty. Affect pleasant.   ECG: SR with occasional PVCs 88 bpm.    ASSESSMENT & PLAN: 1. Chronic  Systolic Heart Failure ECHO 10/2019  EF 20-25%, pericardial effusion, mild LVH, RA/LA dilated.  RV . Possible Tachy Mediated cardiomyopathy verus PVC burden. She has not had LHC due to elevated creatinine. Had [place] 11/23 with preserved cardiac output.  NYHA II. Volume status stable. Continue torsemide 40 mg daily. Check BMET today  - Increase carvedilol 18.75 mg twice a day - No spiro/dig/arb with CKD - Continue hydralazine 100 mg three times a day + imdur 60 mg daily.  - Plan to check ECHO in 3 months after HF medications optimized.  - Discussed low salt food choices and limiting fluid to <  2 liters per day. Provided with weight chart.   2. PAF  - Continue carvedilol  -Continue eliquis 5 mg twice a day.  - EKG today. Maintaining NSR.   3 . HTN   Stable. Increase carvedilol as noted above.   4. H/O AKI  Recent admit peaked at 3.4 - Urology saw--. Bilateral staghorn calculi. No hydronephrosis so no role for percutaneous nephrostomy tubes.  - Follow up with Nephrology next week.   5. Uncontrolled DM Hgb A1C 11.7  - She has follow up with PCP  6. Uterine Mass Likely fibroid per GYN. Needs outpatient follow up.  - Follow up  was set up.   7. PVCs  High PVC burden recent hospitalization.  - Continue mexiletene + carvedilol.  - Place ziopatch to quantify.   Follow up in 2 week. Plan to repeat ECHO in 3 months. Check BMET,CBC.   Adrienne Cardoza NP-C  10:46 AM

## 2019-11-19 NOTE — Telephone Encounter (Signed)
-----   Message from Conrad Sylvania, NP sent at 11/19/2019 12:46 PM EST ----- Please call Potassium low. Add 27meq potassium daily. Renal function improving.

## 2019-11-21 ENCOUNTER — Encounter: Payer: Self-pay | Admitting: Obstetrics & Gynecology

## 2019-11-21 ENCOUNTER — Ambulatory Visit (INDEPENDENT_AMBULATORY_CARE_PROVIDER_SITE_OTHER): Payer: Self-pay | Admitting: Obstetrics & Gynecology

## 2019-11-21 ENCOUNTER — Other Ambulatory Visit: Payer: Self-pay

## 2019-11-21 VITALS — BP 135/78 | HR 71 | Ht 68.0 in | Wt 242.0 lb

## 2019-11-21 DIAGNOSIS — D259 Leiomyoma of uterus, unspecified: Secondary | ICD-10-CM

## 2019-11-21 DIAGNOSIS — K429 Umbilical hernia without obstruction or gangrene: Secondary | ICD-10-CM

## 2019-11-21 MED FILL — !TRUE METRIX BLOOD GLUCOSE: 365 days supply | Qty: 1 | Fill #0

## 2019-11-21 NOTE — Patient Instructions (Signed)
Uterine Fibroids  Uterine fibroids are lumps of tissue (tumors) in your womb (uterus). They are not cancer (are benign). Most women with this condition do not need treatment. Sometimes fibroids can affect your ability to have children (your fertility). If that happens, you may need surgery to take out the fibroids. Follow these instructions at home:  Take over-the-counter and prescription medicines only as told by your doctor. Your doctor may suggest NSAIDs (such as aspirin or ibuprofen) to help with pain.  Ask your doctor if you should: ? Take iron pills. ? Eat more foods that have iron in them, such as dark green, leafy vegetables.  If directed, apply heat to your back or belly to reduce pain. Use the heat source that your doctor recommends, such as a moist heat pack or a heating pad. ? Put a towel between your skin and the heat source. ? Leave the heat on for 20-30 minutes. ? Remove the heat if your skin turns bright red. This is especially important if you are unable to feel pain, heat, or cold. You may have a greater risk of getting burned.  Pay close attention to your period (menstrual) cycles. Tell your doctor about any changes, such as: ? A heavier blood flow than usual. ? Needing to use more pads or tampons than normal. ? A change in how many days your period lasts. ? A change in symptoms that come with your period, such as cramps or back pain.  Keep all follow-up visits as told by your doctor. This is important. Your doctor may need to watch your fibroids over time for any changes. Contact a doctor if you:  Have pain that does not get better with medicine or heat, such as pain or cramps in: ? Your back. ? The area between your hip bones (pelvic area). ? Your belly.  Have new bleeding between your periods.  Have more bleeding during or between your periods.  Feel very tired or weak.  Feel light-headed. Get help right away if you:  Pass out (faint).  Have pain in the  area between your hip bones that suddenly gets worse.  Have bleeding that soaks a tampon or pad in 30 minutes or less. Summary  Uterine fibroids are lumps of tissue (tumors) in your womb (uterus). They are not cancer.  The only treatment that most women need is taking aspirin or ibuprofen for pain.  Contact a doctor if you have pain or cramps that do not get better with medicine.  Make sure you know what symptoms you should get help for right away. This information is not intended to replace advice given to you by your health care provider. Make sure you discuss any questions you have with your health care provider. Document Released: 01/08/2011 Document Revised: 11/18/2017 Document Reviewed: 11/01/2017 Elsevier Patient Education  2020 [employer].

## 2019-11-21 NOTE — Progress Notes (Signed)
Patient ID: Adrienne Lane, female   DOB: [birth year], 52 y.o.   MRN: [id number]  Chief Complaint  Patient presents with  . Referral    enlarged uterus    HPI Adrienne Lane is a 53 y.o. female.  [id number] Patient's last menstrual period was 10/15/2019 (within days). Patient as hospitalized in early Nov on cardiology service. A CT and [place] confirmed her previously Dx fibroid uterus and I was consulted. She has no problem with heavy VB and no pain HPI  Past Medical History:  Diagnosis Date  . Diabetes mellitus without complication ([place])   . Hypertension     Past Surgical History:  Procedure Laterality Date  . CHOLECYSTECTOMY    . RIGHT HEART CATH N/A 11/09/2019   Procedure: RIGHT HEART CATH;  Surgeon: Jolaine Artist, MD;  Location: [place] [facility];  Service: Cardiovascular;  Laterality: N/A;    Family History  Problem Relation Age of Onset  . Atrial fibrillation Mother     Social History Social History   Tobacco Use  . Smoking status: Never Smoker  . Smokeless tobacco: Never Used  Substance Use Topics  . Alcohol use: Never    Frequency: Never  . Drug use: Never    No Known Allergies  Current Outpatient Medications  Medication Sig Dispense Refill  . apixaban (ELIQUIS) 5 MG TABS tablet Take 1 tablet (5 mg total) by mouth 2 (two) times daily. 60 tablet 6  . BLACK ELDERBERRY PO Take by mouth.    . carvedilol (COREG) 12.5 MG tablet Take 1.5 tablets (18.75 mg total) by mouth 2 (two) times daily with a meal. 90 tablet 6  . Cholecalciferol (VITAMIN D3) 10 MCG (400 UNIT) tablet Take 400 Units by mouth daily.    . hydrALAZINE (APRESOLINE) 100 MG tablet Take 1 tablet (100 mg total) by mouth every 8 (eight) hours. 90 tablet 6  . insulin glargine (LANTUS) 100 UNIT/ML injection Inject 0.12 mLs (12 Units total) into the skin daily. 10 mL 11  . Insulin Syringe-Needle U-100 (INSULIN SYRINGE .5CC/30GX1/2") 30G X 1/2" 0.5 ML MISC 12 Units by Does not apply route at bedtime. 100  each 6  . isosorbide mononitrate (IMDUR) 60 MG 24 hr tablet Take 1 tablet (60 mg total) by mouth daily. 30 tablet 6  . mexiletine (MEXITIL) 150 MG capsule Take 2 capsules (300 mg total) by mouth every 12 (twelve) hours. 120 capsule 6  . Multiple Vitamins-Minerals (WOMENS MULTI PO) Take 1 tablet by mouth daily.    Marland Kitchen torsemide (DEMADEX) 20 MG tablet Take 2 tablets (40 mg total) by mouth 2 (two) times daily. 120 tablet 6  . glucose blood test strip Use as instructed (Patient not taking: Reported on 11/21/2019) 100 each 12  . Lancets (ONETOUCH ULTRASOFT) lancets Check CBG twice a day (Patient not taking: Reported on 11/21/2019) 60 each 5  . potassium chloride SA (KLOR-CON) 20 MEQ tablet Take 1 tablet (20 mEq total) by mouth daily. (Patient not taking: Reported on 11/21/2019) 90 tablet 3   No current facility-administered medications for this visit.     Review of Systems Review of Systems  Constitutional: Negative.   Gastrointestinal: Positive for abdominal pain.       Hernia  Genitourinary: Positive for menstrual problem. Negative for vaginal bleeding and vaginal discharge.    Blood pressure 135/78, pulse 71, height 5\' 8"  (1.727 m), weight 242 lb (109.8 kg), last menstrual period 10/15/2019.  Physical Exam Physical Exam Vitals signs and nursing note reviewed.  Constitutional:  Appearance: Normal appearance.  Cardiovascular:     Rate and Rhythm: Normal rate.  Pulmonary:     Effort: Pulmonary effort is normal.  Abdominal:     Palpations: There is mass (lower abdomen below umbilicus).     Hernia: A hernia (umbilical) is present.  Skin:    General: Skin is warm and dry.  Neurological:     General: No focal deficit present.     Mental Status: She is alert.  Psychiatric:        Mood and Affect: Mood normal.        Behavior: Behavior normal.     Data Reviewed CLINICAL DATA:  Initial evaluation for uterine anomaly.  EXAM: TRANSABDOMINAL AND TRANSVAGINAL ULTRASOUND OF  PELVIS  TECHNIQUE: Both transabdominal and transvaginal ultrasound examinations of the pelvis were performed. Transabdominal technique was performed for global imaging of the pelvis including uterus, ovaries, adnexal regions, and pelvic cul-de-sac. It was necessary to proceed with endovaginal exam following the transabdominal exam to visualize the uterus, endometrium, and ovaries.  COMPARISON:  Prior CT from 10/23/2019.  FINDINGS: Uterus  Evaluation of the uterus is limited given body habitus in size. Uterus measures up to approximately 16.3 x 15.8 x 15.8 cm. Previously identified large masslike lesion involving the uterus is not well delineated given size, and could reflect a large fibroid or possibly endometrial mass.  Endometrium  Not definitely visualized.  Right ovary  Not visualized.  No adnexal mass.  Left ovary  Not visualized.  No adnexal mass.  Other findings  Moderate volume free fluid within the pelvis.  IMPRESSION: 1. Technically limited exam due to body habitus. 2. Marked enlargement of the uterus, corresponding with abnormality seen on prior CT. Unclear whether this reflects a large uterine fibroid or possibly endometrial mass, not well assessed on this technically limited exam. Given the limitations of this study, further assessment with dedicated pelvic MRI is recommended for further characterization. Given the patient's low GFR, a noncontrast examination could be performed. 3. Nonvisualization of the ovaries.  No other adnexal mass. 4. Moderate volume free fluid within the pelvis.   Electronically Signed   By: Jeannine Boga M.D.   On: 10/24/2019 18:48 CLINICAL DATA:  Evaluate for incarcerated umbilical hernia.  EXAM: CT ABDOMEN AND PELVIS WITHOUT CONTRAST  TECHNIQUE: Multidetector CT imaging of the abdomen and pelvis was performed following the standard protocol without IV contrast.  COMPARISON:   10/23/2019  FINDINGS: Lower chest: Small calcification along the medial right lung base. 3 mm nodule along the left major fissure on sequence 5, image 7. Slightly mosaic attenuation in the lower lungs could represent air trapping versus areas of atelectasis. No significant pleural effusions.  Hepatobiliary: Again noted is a small amount of perihepatic ascites. Perihepatic ascites has slightly decreased from the comparison examination. Gallbladder has been surgically removed.  Pancreas: Unremarkable. No pancreatic ductal dilatation or surrounding inflammatory changes.  Spleen: Normal in size without focal abnormality.  Adrenals/Urinary Tract: Adrenal glands are poorly characterized. Large bilateral staghorn calculi appear to be involving most of the renal calices. Evidence for cortical scarring or atrophy along the left kidney lower pole. Normal appearance of the urinary bladder.  Stomach/Bowel: There is oral contrast on this examination. Oral contrast in the stomach and small bowel. There is no contrast in the colon but there is no evidence for bowel extending into the umbilical hernia. No evidence for bowel obstruction or focal bowel inflammation.  Vascular/Lymphatic: Atherosclerotic calcifications in the aorta and iliac arteries. Negative for  an aortic aneurysm. No significant lymph node enlargement but limited evaluation on this noncontrast examination.  Reproductive: Again noted is a markedly enlarged uterus measuring 18.2 x 14.0 x 16.9 cm. Uterus is heterogeneous and difficult to evaluate for fibroids or a uterine mass. No gross adnexal lesions.  Other: Again noted is a umbilical or periumbilical hernia containing fat and fluid. This ventral hernia is large measuring up to 7.1 cm in the craniocaudal dimension. Diffuse subcutaneous edema in the anterior abdominal soft tissues. Decreased subcutaneous edema appeared to the previous examination. Negative for  free intraperitoneal air.  Musculoskeletal: Disc space narrowing and endplate changes at O0-B5.  IMPRESSION: 1. Large umbilical hernia containing fat and fluid. Fluid within the hernia could represent ascites but cannot exclude incarceration or inflammation involving the umbilical hernia. No evidence for bowel within this umbilical hernia. 2. Large bilateral renal staghorn calculi. 3. Markedly enlarged uterus. Differential would include uterine fibroids versus endometrial mass. Consider further characterization with a pelvic MRI as was mentioned on the ultrasound from 10/24/2019. 4. Decreased ascites and subcutaneous edema since 10/23/2019. 5. 3 mm nodule in the left lung is indeterminate. No follow-up needed if patient is low-risk. Non-contrast chest CT can be considered in 12 months if patient is high-risk. This recommendation follows the consensus statement: Guidelines for Management of Incidental Pulmonary Nodules Detected on CT Images: From the Fleischner Society 2017; Radiology 2017; 284:228-243.   Electronically Signed   By: Markus Daft M.D.   On: 11/07/2019 13:48   Assessment Large fibroid uterus essentially asymptomatic Umbilical hernia likely to be repaired Plan F/u in 6 month Report if sx of bleeding pain or mass increase   Emeterio Reeve 11/21/2019, 10:17 AM

## 2019-11-22 ENCOUNTER — Ambulatory Visit (HOSPITAL_COMMUNITY)
Admission: RE | Admit: 2019-11-22 | Discharge: 2019-11-22 | Disposition: A | Discharge status: Home / Self Care | Payer: MEDICAID | Source: Ambulatory Visit | Attending: Cardiology | Admitting: Cardiology

## 2019-11-22 ENCOUNTER — Telehealth (HOSPITAL_COMMUNITY): Payer: Self-pay

## 2019-11-22 DIAGNOSIS — I4891 Unspecified atrial fibrillation: Secondary | ICD-10-CM

## 2019-11-22 NOTE — Telephone Encounter (Signed)
Pt called to report that zio patch is blinking orange.  She tried calling company to trouble shoot however they were unsuccessful and it continues to blink orange.  Advised to come to office for a replacement patch.  She will do so with nurses today.  Staff made aware of same.

## 2019-12-02 NOTE — Progress Notes (Signed)
PCP: none  Primary Cardiologist: Dr Gardiner Rhyme  HPI: Adrienne Lane is a 53 year old with a history of morbid obesity, HTN, DM, PAF, PVCs, and chronic systolic HF.   Admitted to [facility] 10/20/19 after a mechanical fall and landed on her hip. ECHO showed EF 20-25% with severe RV dysfunction as well. 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 Started on amio/ heparin drip and IV lasix. Renal function worsened. Abdominal Xray showed large staghorn calculi. CT Renal Stone showed bilateral staghorn calculi without hydronephrosis and severe anasarca. Urology consulted on 11/3 with mention of possible nephrostomy tubes but renal US was negative for hydronephrosis. GYN consulted for uterine mass. Mass was thought to be large fibroid that will need to follow up after discharge. Nephrology consulted for worsening renal failure. Transferred to [facility] for CVVHD. Overall diuresed 90 pounds. Later transitioned to torsemide 40 mg daily. Placed on carvedilol and mexiletine due to high PVC burden. Discharged on 11/12/19 with weight 255 pounds.   Today she returns for HF follow up. Last visit carvedilol was increased to 18.75 mg twice a day. Overall feeling fine. Mild SOB with inclines. Denies PND/Orthopnea.No bleeding issues.  Appetite ok. No fever or chills. Weight at home 238-242  pounds. Taking all medications  ECHO 10/2019.  EF 20-25%, pericardial effusion, mild LVH, RA/LA dilated. RV down.   [place] 11/12/19  RA = 11 RV = 47/15 PA = 49/17 (32) PCW = 20 Fick cardiac output/index = 8.8/3.8 PVR = 1.4 WU FA sat = 97% PA sat = 70%, 72% High SVC = 68%  ROS: All systems negative except as listed in HPI, PMH and Problem List.  SH:  Social History   Socioeconomic History  . Marital status: Married    Spouse name: Not on file  . Number of children: Not on file  . Years of education: Not on file  . Highest education level: Not on file  Occupational History  . Not on file  Tobacco Use  . Smoking status: Never Smoker    . Smokeless tobacco: Never Used  Substance and Sexual Activity  . Alcohol use: Never  . Drug use: Never  . Sexual activity: Not on file  Other Topics Concern  . Not on file  Social History Narrative  . Not on file   Social Determinants of Health   Financial Resource Strain:   . Difficulty of Paying Living Expenses: Not on file  Food Insecurity:   . Worried About Charity fundraiser in the Last Year: Not on file  . Ran Out of Food in the Last Year: Not on file  Transportation Needs:   . Lack of Transportation (Medical): Not on file  . Lack of Transportation (Non-Medical): Not on file  Physical Activity:   . Days of Exercise per Week: Not on file  . Minutes of Exercise per Session: Not on file  Stress:   . Feeling of Stress : Not on file  Social Connections:   . Frequency of Communication with Friends and Family: Not on file  . Frequency of Social Gatherings with Friends and Family: Not on file  . Attends Religious Services: Not on file  . Active Member of Clubs or Organizations: Not on file  . Attends Archivist Meetings: Not on file  . Marital Status: Not on file  Intimate Partner Violence:   . Fear of Current or Ex-Partner: Not on file  . Emotionally Abused: Not on file  . Physically Abused: Not on file  . Sexually  Abused: Not on file    FH:  Family History  Problem Relation Age of Onset  . Atrial fibrillation Mother     Past Medical History:  Diagnosis Date  . Diabetes mellitus without complication ([place])   . Hypertension     Current Outpatient Medications  Medication Sig Dispense Refill  . apixaban (ELIQUIS) 5 MG TABS tablet Take 1 tablet (5 mg total) by mouth 2 (two) times daily. 60 tablet 6  . BLACK ELDERBERRY PO Take by mouth.    . carvedilol (COREG) 12.5 MG tablet Take 1.5 tablets (18.75 mg total) by mouth 2 (two) times daily with a meal. 90 tablet 6  . Cholecalciferol (VITAMIN D3) 10 MCG (400 UNIT) tablet Take 400 Units by mouth daily.    Marland Kitchen  glucose blood test strip Use as instructed 100 each 12  . hydrALAZINE (APRESOLINE) 100 MG tablet Take 1 tablet (100 mg total) by mouth every 8 (eight) hours. 90 tablet 6  . insulin glargine (LANTUS) 100 UNIT/ML injection Inject 0.12 mLs (12 Units total) into the skin daily. 10 mL 11  . Insulin Syringe-Needle U-100 (INSULIN SYRINGE .5CC/30GX1/2") 30G X 1/2" 0.5 ML MISC 12 Units by Does not apply route at bedtime. 100 each 6  . isosorbide mononitrate (IMDUR) 60 MG 24 hr tablet Take 1 tablet (60 mg total) by mouth daily. 30 tablet 6  . Lancets (ONETOUCH ULTRASOFT) lancets Check CBG twice a day 60 each 5  . mexiletine (MEXITIL) 150 MG capsule Take 2 capsules (300 mg total) by mouth every 12 (twelve) hours. 120 capsule 6  . Multiple Vitamins-Minerals (WOMENS MULTI PO) Take 1 tablet by mouth daily.    . potassium chloride SA (KLOR-CON) 20 MEQ tablet Take 1 tablet (20 mEq total) by mouth daily. 90 tablet 3  . torsemide (DEMADEX) 20 MG tablet Take 2 tablets (40 mg total) by mouth 2 (two) times daily. 120 tablet 6   No current facility-administered medications for this encounter.    Vitals:   12/03/19 0830  BP: 112/63  Pulse: 89  SpO2: 97%  Weight: 108.4 kg (239 lb)   Wt Readings from Last 3 Encounters:  12/03/19 108.4 kg (239 lb)  11/21/19 109.8 kg (242 lb)  11/19/19 108.4 kg (239 lb)    PHYSICAL EXAM: General:  Well appearing. No resp difficulty HEENT: normal Neck: supple. no JVD. Carotids 2+ bilat; no bruits. No lymphadenopathy or thryomegaly appreciated. Cor: PMI nondisplaced. Regular rate & rhythm. No rubs, gallops or murmurs. Lungs: clear Abdomen: soft, nontender, nondistended. No hepatosplenomegaly. No bruits or masses. Good bowel sounds. Extremities: no cyanosis, clubbing, rash, edema Neuro: alert & orientedx3, cranial nerves grossly intact. moves all 4 extremities w/o difficulty. Affect pleasant  ASSESSMENT & PLAN: 1. Chronic  Systolic Heart Failure ECHO 10/2019  EF 20-25%,  pericardial effusion, mild LVH, RA/LA dilated.  RV . Possible Tachy Mediated cardiomyopathy verus PVC burden. She has not had LHC due to elevated creatinine. Had [place] 11/23 with preserved cardiac output.  NYHA II-III. Volume status stable. Continue torsemide 40 mg daily.  Continue carvedilol 18.75 mg twice a day. - No spiro/dig/arb with CKD - Continue hydralazine 100 mg three times a day + imdur 60 mg daily.  - Plan to check ECHO in 8 -10 weeks.   2. PAF  - Continue carvedilol  -Continue eliquis 5 mg twice a day.  - Regular pulse on exam.   3 . HTN  - Stable.   4. H/O AKI  Recent admit peaked at  3.4 - Urology saw--. Bilateral staghorn calculi. No hydronephrosis so no role for percutaneous nephrostomy tubes.   - Follow up with Nephrology this week.    5. Uncontrolled DM Hgb A1C 11.7  - She has follow up with PCP  6. Uterine Mass Likely fibroid per GYN.  - She has had GYN F/U and be seen again in 6 month.   7. PVCs  High PVC burden recent hospitalization.  - Continue mexiletene + carvedilol.  - ZioPatch results pending.    Follow up 4 weeks. Zio Patch results pending. Follow up with Dr [name] in 8 weeks with an ECHO.   Jaxson Anglin NP-C  8:41 AM

## 2019-12-03 ENCOUNTER — Ambulatory Visit (HOSPITAL_COMMUNITY)
Admission: RE | Admit: 2019-12-03 | Discharge: 2019-12-03 | Disposition: A | Discharge status: Home / Self Care | Payer: Self-pay | Source: Ambulatory Visit | Attending: Cardiology | Admitting: Cardiology

## 2019-12-03 ENCOUNTER — Encounter (HOSPITAL_COMMUNITY): Payer: Self-pay

## 2019-12-03 ENCOUNTER — Other Ambulatory Visit: Payer: Self-pay

## 2019-12-03 VITALS — BP 112/63 | HR 89 | Wt 239.0 lb

## 2019-12-03 DIAGNOSIS — Z79899 Other long term (current) drug therapy: Secondary | ICD-10-CM | POA: Insufficient documentation

## 2019-12-03 DIAGNOSIS — I509 Heart failure, unspecified: Secondary | ICD-10-CM

## 2019-12-03 DIAGNOSIS — Z7901 Long term (current) use of anticoagulants: Secondary | ICD-10-CM | POA: Insufficient documentation

## 2019-12-03 DIAGNOSIS — I10 Essential (primary) hypertension: Secondary | ICD-10-CM

## 2019-12-03 DIAGNOSIS — I4891 Unspecified atrial fibrillation: Secondary | ICD-10-CM

## 2019-12-03 DIAGNOSIS — I313 Pericardial effusion (noninflammatory): Secondary | ICD-10-CM | POA: Insufficient documentation

## 2019-12-03 DIAGNOSIS — N200 Calculus of kidney: Secondary | ICD-10-CM | POA: Insufficient documentation

## 2019-12-03 DIAGNOSIS — E1165 Type 2 diabetes mellitus with hyperglycemia: Secondary | ICD-10-CM | POA: Insufficient documentation

## 2019-12-03 DIAGNOSIS — Z8249 Family history of ischemic heart disease and other diseases of the circulatory system: Secondary | ICD-10-CM | POA: Insufficient documentation

## 2019-12-03 DIAGNOSIS — I110 Hypertensive heart disease with heart failure: Secondary | ICD-10-CM | POA: Insufficient documentation

## 2019-12-03 DIAGNOSIS — I480 Paroxysmal atrial fibrillation: Secondary | ICD-10-CM | POA: Insufficient documentation

## 2019-12-03 DIAGNOSIS — Z87448 Personal history of other diseases of urinary system: Secondary | ICD-10-CM | POA: Insufficient documentation

## 2019-12-03 DIAGNOSIS — I493 Ventricular premature depolarization: Secondary | ICD-10-CM | POA: Insufficient documentation

## 2019-12-03 DIAGNOSIS — I5022 Chronic systolic (congestive) heart failure: Secondary | ICD-10-CM | POA: Insufficient documentation

## 2019-12-03 DIAGNOSIS — N858 Other specified noninflammatory disorders of uterus: Secondary | ICD-10-CM | POA: Insufficient documentation

## 2019-12-03 DIAGNOSIS — Z794 Long term (current) use of insulin: Secondary | ICD-10-CM | POA: Insufficient documentation

## 2019-12-03 NOTE — Patient Instructions (Signed)
It was great to see you today! No medication changes are needed at this time.  Your physician has requested that you have an echocardiogram. Echocardiography is a painless test that uses sound waves to create images of your heart. It provides your doctor with information about the size and shape of your heart and how well your heart's chambers and valves are working. This procedure takes approximately one hour. There are no restrictions for this procedure.   Your physician recommends that you schedule a follow-up appointment in: 2 weeks  in the Advanced Practitioners (PA/NP) Clinic  And in 8 weeks with Dr [name] with echo  Do the following things EVERYDAY: 1) Weigh yourself in the morning before breakfast. Write it down and keep it in a log. 2) Take your medicines as prescribed 3) Eat low salt foods--Limit salt (sodium) to 2000 mg per day.  4) Stay as active as you can everyday 5) Limit all fluids for the day to less than 2 liters  At the [facility], you and your health needs are our priority. As part of our continuing mission to provide you with exceptional heart care, we have created designated Provider Care Teams. These Care Teams include your primary Cardiologist (physician) and Advanced Practice Providers (APPs- Physician Assistants and Nurse Practitioners) who all work together to provide you with the care you need, when you need it.   You may see any of the following providers on your designated Care Team at your next follow up: Marland Kitchen Dr [name] . Dr Loralie Champagne . Darrick Grinder, NP . Lyda Jester, PA . Audry Riles, PharmD   Please be sure to bring in all your medications bottles to every appointment.

## 2019-12-11 MED FILL — CARVEDILOL 12.5 MG TABLET: 12.5 | 90 days supply | Qty: 90 | Fill #0

## 2019-12-17 ENCOUNTER — Ambulatory Visit: Payer: Self-pay

## 2019-12-18 ENCOUNTER — Ambulatory Visit (INDEPENDENT_AMBULATORY_CARE_PROVIDER_SITE_OTHER): Payer: Self-pay | Admitting: Internal Medicine

## 2019-12-18 DIAGNOSIS — K429 Umbilical hernia without obstruction or gangrene: Secondary | ICD-10-CM

## 2019-12-18 DIAGNOSIS — N200 Calculus of kidney: Secondary | ICD-10-CM

## 2019-12-18 DIAGNOSIS — N184 Chronic kidney disease, stage 4 (severe): Secondary | ICD-10-CM

## 2019-12-18 DIAGNOSIS — Z794 Long term (current) use of insulin: Secondary | ICD-10-CM

## 2019-12-18 DIAGNOSIS — E1159 Type 2 diabetes mellitus with other circulatory complications: Secondary | ICD-10-CM

## 2019-12-18 DIAGNOSIS — I493 Ventricular premature depolarization: Secondary | ICD-10-CM

## 2019-12-18 DIAGNOSIS — I5022 Chronic systolic (congestive) heart failure: Secondary | ICD-10-CM

## 2019-12-18 DIAGNOSIS — D259 Leiomyoma of uterus, unspecified: Secondary | ICD-10-CM

## 2019-12-18 DIAGNOSIS — I480 Paroxysmal atrial fibrillation: Secondary | ICD-10-CM

## 2019-12-18 DIAGNOSIS — R310 Gross hematuria: Secondary | ICD-10-CM

## 2019-12-18 NOTE — Progress Notes (Signed)
Virtual Visit via Telephone Note Due to current restrictions/limitations of in-office visits due to the COVID-19 pandemic, this scheduled clinical appointment was converted to a telehealth visit  I connected with Adrienne Lane on 12/18/19 at  2:50 PM EST by telephone and verified that I am speaking with the correct person using two identifiers. I am in my office.  The patient is in her car with her spouse driving.  Only the patient and myself participated in this encounter.  I discussed the limitations, risks, security and privacy concerns of performing an evaluation and management service by telephone and the availability of in person appointments. I also discussed with the patient that there may be a patient responsible charge related to this service. The patient expressed understanding and agreed to proceed.   History of Present Illness: Patient with history of DM type II, systolic CHF, PAF, frequent PVCs, morbid obesity, CKD stage 4, uterine fibroid, nephrolithiasis with staghorn calculi bilaterally.  Purpose of today's visit is hospital follow-up and to establish care at [place]  Previous PCP was in [place].  She was last seen there 06/2018.  She has not had a PCP since relocating to [place].  Patient hospitalized 10/31-11/23/2020 with new acute systolic heart failure frequent PVCs and PAF.  She did not respond to IV diuretics initially and required CVVHD for 5 days.  By the time she was discharged weight was down to 250 pounds having diuresed greater than 70 pounds. -EF 20-25% with global hypokinesis and moderate severe tricuspid valve regurg.  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  Serotonin release assay subsequently negative.  Platelet count has since normalized. -Umbilical hernia: She complained of painful chronic umbilical hernia.  CT scan  did not show incarceration but did contain fat.  Plan is for her to follow-up with general surgery -Hematuria: Patient states she had gross hematuria prior to and during hospitalization and continues to have it.  She was noted to have bilateral staghorn calculi once CAT scan and plan was for follow-up with urology as an outpatient. -Uterine fibroid: Endometrial mass versus large uterine fibroid seen on transvaginal ultrasound which was limited by body habitus.  Plan was for her to follow-up with gynecology as an outpatient.  Today:   CHF/atrial fibrillation/PVCs: Patient reports she is doing much better since hospitalization.  Weight is down to 225 pounds.  She is trying to limit salt in the foods. -Some shortness of breath when walking uphill or upstairs.  No lower extremity edema. Wore zio patch x7 days and has turned it in.  She is awaiting results. -She has not had any bruising since being on Eliquis.  She is having some hematuria which has not changed since hospitalization -She was seen in heart failure clinic on the 14th of this month.  Note reviewed from that visit.  DM: She is on Lantus 12 units daily.  She checks blood sugars fasting before breakfast.  Last 5 readings were 139, 188, 198, 163, 155.  She tries to be careful with her eating habits.  Renal stone/hematuria: In addition to the staghorn calculus, CT renal protocol also revealed circumferential thickening of the urinary bladder.  She has appointment [facility] 01/10/[facility].  Uterine fibroid: She was seen by the gynecologist Dr. Roselie Awkward on 12/10/2019.  He told her that the plan for now is to observe and have her follow-up in about 6 months.  She still gets regular menstrual cycles with moderate bleeding lasting 7 to 9 days.  She has a normocytic anemia with iron studies revealing iron of 44, ferritin of 20, TIBC of 353.  She is currently not on iron supplement.  Most recent CBC showed an improvement in H&H of 10.3/33  CKD 4:  She has seen Dr. Carmina Miller with [facility] on 12/05/2019.  No changes made in medications.  She did have blood draw and UA done on that visit.  Umbilical hernia: She reports pain "every now and then only at night."  She reports that when she does have pain it is never enough to warrant taking any medications.  She has not had any nausea or vomiting.   Outpatient Encounter Medications as of 12/18/2019  Medication Sig  . apixaban (ELIQUIS) 5 MG TABS tablet Take 1 tablet (5 mg total) by mouth 2 (two) times daily.  Marland Kitchen BLACK ELDERBERRY PO Take by mouth.  . carvedilol (COREG) 12.5 MG tablet Take 1.5 tablets (18.75 mg total) by mouth 2 (two) times daily with a meal.  . Cholecalciferol (VITAMIN D3) 10 MCG (400 UNIT) tablet Take 400 Units by mouth daily.  Marland Kitchen glucose blood test strip Use as instructed  . hydrALAZINE (APRESOLINE) 100 MG tablet Take 1 tablet (100 mg total) by mouth every 8 (eight) hours.  . insulin glargine (LANTUS) 100 UNIT/ML injection Inject 0.12 mLs (12 Units total) into the skin daily.  . Insulin Syringe-Needle U-100 (INSULIN SYRINGE .5CC/30GX1/2") 30G X 1/2" 0.5 ML MISC 12 Units by Does not apply route at bedtime.  . isosorbide mononitrate (IMDUR) 60 MG 24 hr tablet Take 1 tablet (60 mg total) by mouth daily.  . Lancets (ONETOUCH ULTRASOFT) lancets Check CBG twice a day  . mexiletine (MEXITIL) 150 MG capsule Take 2 capsules (300 mg total) by mouth every 12 (twelve) hours.  . Multiple Vitamins-Minerals (WOMENS MULTI PO) Take 1 tablet by mouth daily.  . potassium chloride SA (KLOR-CON) 20 MEQ tablet Take 1 tablet (20 mEq total) by mouth daily.  Marland Kitchen torsemide (DEMADEX) 20 MG tablet Take 2 tablets (40 mg total) by mouth 2 (two) times daily.   No facility-administered encounter medications on file as of 12/18/2019.    Observations/Objective: Lab Results  Component Value Date   WBC 5.0 11/19/2019   HGB 10.3 (L) 11/19/2019   HCT 33.0 (L) 11/19/2019   MCV 84.2 11/19/2019   PLT 269  11/19/2019     Chemistry      Component Value Date/Time   NA 143 11/19/2019 1049   K 3.3 (L) 11/19/2019 1049   CL 103 11/19/2019 1049   CO2 27 11/19/2019 1049   BUN 45 (H) 11/19/2019 1049   CREATININE 2.33 (H) 11/19/2019 1049      Component Value Date/Time   CALCIUM 9.1 11/19/2019 1049   ALKPHOS 69 10/22/2019 0925   AST 23 10/22/2019 0925   ALT 22 10/22/2019 0925   BILITOT 1.2 10/22/2019 0925     Lab Results  Component Value Date   IRON 44 10/21/2019   TIBC 355 10/21/2019   FERRITIN 20 10/21/2019     Assessment and Plan: 1. Chronic systolic congestive heart failure ([place]) -Followed by cardiology. She sounds clinically stable and has had additional weight loss.  DASH diet discussed and encouraged.  Encouraged her to continue to weigh herself at least once a week. -Continue medications as prescribed by cardiology  2. Paroxysmal atrial fibrillation (HCC) 3. Frequent PVCs Followed by cardiology. Advised to report  any significant bruising or bleeding while on DOAC.  However she is currently having some gross hematuria which she has had since hospitalization and for which she will be seeing urologist  4. Type 2 diabetes mellitus with other circulatory complication, with long-term current use of insulin (HCC) Would like to get morning blood sugars a little bit better but will not try to tightly control blood sugars given her other comorbidities including CKD.  Recommend that she increase the Lantus from 12 units daily to 15 units daily.  Continue to monitor blood sugars  5. Uterine leiomyoma, unspecified location She has seen the gynecologist already.  Plan is to monitor for now and have her follow-up in 6 months  6. Gross hematuria 7. Staghorn renal calculus Has appointment coming up with the urologist. -In the meantime I recommend that she start taking iron 325 mg once a day which she will purchase over-the-counter.  8. Chronic kidney disease (CKD), stage IV (severe)  ([place]) We need to be careful with any medicine prescribed to make sure that it does not adversely affect kidney function.  She will continue follow-up with the nephrologist  9. Umbilical hernia without obstruction and without gangrene Went over signs and symptoms that would suggest need for urgent evaluation.   Follow Up Instructions: F/u in 4-6 wks   I discussed the assessment and treatment plan with the patient. The patient was provided an opportunity to ask questions and all were answered. The patient agreed with the plan and demonstrated an understanding of the instructions.   The patient was advised to call back or seek an in-person evaluation if the symptoms worsen or if the condition fails to improve as anticipated.  I provided 22 minutes of non-face-to-face time during this encounter.   Karle Plumber, MD

## 2019-12-24 MED FILL — TORSEMIDE 20 MG TABLET: 20 | 30 days supply | Qty: 120 | Fill #1

## 2019-12-24 MED FILL — ISOSORBIDE MN ER 60 MG TAB: 60 | 30 days supply | Qty: 30 | Fill #1

## 2019-12-24 MED FILL — MEXILETINE HCL 150 MG CAPS: 150 | 30 days supply | Qty: 120 | Fill #1

## 2019-12-24 MED FILL — !ELIQUIS 5MG TABLET: 5 | 30 days supply | Qty: 60 | Fill #1

## 2019-12-26 ENCOUNTER — Telehealth (HOSPITAL_COMMUNITY): Payer: Self-pay | Admitting: Cardiology

## 2019-12-26 DIAGNOSIS — I4891 Unspecified atrial fibrillation: Secondary | ICD-10-CM

## 2019-12-26 DIAGNOSIS — I493 Ventricular premature depolarization: Secondary | ICD-10-CM

## 2019-12-26 NOTE — Telephone Encounter (Signed)
Patient aware of results and voiced understanding Referral placed    Clegg, Amy D, NP  12/26/2019 1:20 PM EST    Ongoing high PVC burden. Was on amiodarone in the hospital and later switched to mexiletene. Will refer to EP. ? Possible ablation with 2 morphologies.   Please call her and let her know we are referring to EP.

## 2019-12-26 NOTE — Telephone Encounter (Signed)
-----   Message from Conrad Tioga, NP sent at 12/26/2019  1:20 PM EST ----- Ongoing high PVC burden. Was on amiodarone in the hospital and later switched to mexiletene.  Will refer to EP. ? Possible ablation with 2 morphologies.   Please call her and let her know we are referring to EP.

## 2019-12-30 NOTE — Progress Notes (Signed)
PCP: Dr Wynetta Emery  Primary Cardiologist: Dr Gardiner Rhyme HF MD: Dr [name] Nephorology: Dr Joelyn Oms   HPI: Adrienne Lane is a 54 year old with a history of morbid obesity, HTN, DM, PAF, PVCs, and chronic systolic HF.   Admitted to [facility] 10/20/19 after a mechanical fall and landed on her hip. ECHO showed EF 20-25% with severe RV dysfunction as well. 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 Started on amio/ heparin drip and IV lasix. Renal function worsened. Abdominal Xray showed large staghorn calculi. CT Renal Stone showed bilateral staghorn calculi without hydronephrosis and severe anasarca. Urology consulted on 11/3 with mention of possible nephrostomy tubes but renal US was negative for hydronephrosis. GYN consulted for uterine mass. Mass was thought to be large fibroid that will need to follow up after discharge. Nephrology consulted for worsening renal failure. Transferred to [facility] for CVV[facility]. Overall diuresed 90 pounds. Later transitioned to torsemide 40 mg daily. Placed on carvedilol and mexiletine due to high PVC burden. Discharged on 11/12/19 with weight 255 pounds.   Zio Patch 11/2019 High PVC burden with 2 different morphologies. Referred to EP 12/26/19  Today she returns for HF follow up.Overall feeling fine. SOB with inclines Denies PND/Orthopnea.  No bleeding issues. Appetite ok. No fever or chills. Weight at home 225-233 pounds. Taking all medications.   ECHO 10/21/2019. EF 20-25%, pericardial effusion, mild LVH, RA/LA dilated. RV down.   [place] 11/12/19  RA = 11 RV = 47/15 PA = 49/17 (32) PCW = 20 Fick cardiac output/index = 8.8/3.8 PVR = 1.4 WU FA sat = 97% PA sat = 70%, 72% High SVC = 68%  ROS: All systems negative except as listed in HPI, PMH and Problem List.  SH:  Social History   Socioeconomic History  . Marital status: Married    Spouse name: Not on file  . Number of children: Not on file  . Years of education: Not on file  . Highest education level: Not on file    Occupational History  . Not on file  Tobacco Use  . Smoking status: Never Smoker  . Smokeless tobacco: Never Used  Substance and Sexual Activity  . Alcohol use: Never  . Drug use: Never  . Sexual activity: Not on file  Other Topics Concern  . Not on file  Social History Narrative  . Not on file   Social Determinants of Health   Financial Resource Strain:   . Difficulty of Paying Living Expenses: Not on file  Food Insecurity:   . Worried About Charity fundraiser in the Last Year: Not on file  . Ran Out of Food in the Last Year: Not on file  Transportation Needs:   . Lack of Transportation (Medical): Not on file  . Lack of Transportation (Non-Medical): Not on file  Physical Activity:   . Days of Exercise per Week: Not on file  . Minutes of Exercise per Session: Not on file  Stress:   . Feeling of Stress : Not on file  Social Connections:   . Frequency of Communication with Friends and Family: Not on file  . Frequency of Social Gatherings with Friends and Family: Not on file  . Attends Religious Services: Not on file  . Active Member of Clubs or Organizations: Not on file  . Attends Archivist Meetings: Not on file  . Marital Status: Not on file  Intimate Partner Violence:   . Fear of Current or Ex-Partner: Not on file  . Emotionally Abused: Not on  file  . Physically Abused: Not on file  . Sexually Abused: Not on file    FH:  Family History  Problem Relation Age of Onset  . Atrial fibrillation Mother     Past Medical History:  Diagnosis Date  . Diabetes mellitus without complication ([place])   . Hypertension     Current Outpatient Medications  Medication Sig Dispense Refill  . apixaban (ELIQUIS) 5 MG TABS tablet Take 1 tablet (5 mg total) by mouth 2 (two) times daily. 60 tablet 6  . BLACK ELDERBERRY PO Take by mouth.    . carvedilol (COREG) 12.5 MG tablet Take 1.5 tablets (18.75 mg total) by mouth 2 (two) times daily with a meal. 90 tablet 6  .  Cholecalciferol (VITAMIN D3) 10 MCG (400 UNIT) tablet Take 400 Units by mouth daily.    Marland Kitchen glucose blood test strip Use as instructed 100 each 12  . hydrALAZINE (APRESOLINE) 100 MG tablet Take 1 tablet (100 mg total) by mouth every 8 (eight) hours. 90 tablet 6  . insulin glargine (LANTUS) 100 UNIT/ML injection Inject 0.12 mLs (12 Units total) into the skin daily. 10 mL 11  . Insulin Syringe-Needle U-100 (INSULIN SYRINGE .5CC/30GX1/2") 30G X 1/2" 0.5 ML MISC 12 Units by Does not apply route at bedtime. 100 each 6  . isosorbide mononitrate (IMDUR) 60 MG 24 hr tablet Take 1 tablet (60 mg total) by mouth daily. 30 tablet 6  . Lancets (ONETOUCH ULTRASOFT) lancets Check CBG twice a day 60 each 5  . mexiletine (MEXITIL) 150 MG capsule Take 2 capsules (300 mg total) by mouth every 12 (twelve) hours. 120 capsule 6  . Multiple Vitamins-Minerals (WOMENS MULTI PO) Take 1 tablet by mouth daily.    . potassium chloride SA (KLOR-CON) 20 MEQ tablet Take 1 tablet (20 mEq total) by mouth daily. 90 tablet 3  . torsemide (DEMADEX) 20 MG tablet Take 2 tablets (40 mg total) by mouth 2 (two) times daily. 120 tablet 6   No current facility-administered medications for this encounter.    Vitals:   12/31/19 0951  BP: 128/85  Pulse: 82  SpO2: 100%  Weight: 103.4 kg (228 lb)   Wt Readings from Last 3 Encounters:  12/31/19 103.4 kg (228 lb)  12/03/19 108.4 kg (239 lb)  11/21/19 109.8 kg (242 lb)    PHYSICAL EXAM: General:  Well appearing. No resp difficulty HEENT: normal Neck: supple. no JVD. Carotids 2+ bilat; no bruits. No lymphadenopathy or thryomegaly appreciated. Cor: PMI nondisplaced. Irregular rate & rhythm. No rubs, gallops or murmurs. Lungs: clear Abdomen: soft, nontender, nondistended. No hepatosplenomegaly. No bruits or masses. Good bowel sounds. Extremities: no cyanosis, clubbing, rash, R and LLE trace edema. Compression stockings in place.  Neuro: alert & orientedx3, cranial nerves grossly intact.  moves all 4 extremities w/o difficulty. Affect pleasant  EKG: SR with frequent PVCs 79 bpm    ASSESSMENT & PLAN: 1. Chronic  Systolic Heart Failure ECHO 10/2019  EF 20-25%, pericardial effusion, mild LVH, RA/LA dilated.  RV . Possible Tachy Mediated cardiomyopathy verus PVC burden. She has not had LHC due to elevated creatinine. Had [place] 11/23 with preserved cardiac output.  NYHA III. Volume status stable. Continue torsemide 40 mg daily.  - Increase carvedilol to 25 mg twice a day.   - No spiro/dig/arb with CKD - Continue hydralazine 100 mg three times a day + imdur 60 mg daily.  - Plan to check ECHO 02/03/19 with Dr [name].    2. PAF  -  Maintaining NSR.  - Continue carvedilol  -Continue eliquis 5 mg twice a day.   3 . HTN  -Stable.   4. H/O AKI  Recent admit peaked at 3.4 - Urology saw--. Bilateral staghorn calculi. No hydronephrosis so no role for percutaneous nephrostomy tubes. She has follow up Urology  - Creatinine down 2.33.  - Check BMET today    5. Uncontrolled DM Hgb A1C 11.7  - She has been seen by PCP with adjustments made.   6. Uterine Mass Likely fibroid per GYN.  - She has had GYN F/U and be seen again in 6 month.   7. PVCs  High PVC burden recent hospitalization.  - Continue mexiletene + carvedilol.  - ZioPatch- High PVC burden with 2 different morphoogies. Referred to EP ob 12/26/19   Follow up 4 weeks with Dr [name] and an ECHO.    Eulalio Reamy NP-C  9:59 AM

## 2019-12-31 ENCOUNTER — Encounter (HOSPITAL_COMMUNITY): Payer: Self-pay

## 2019-12-31 ENCOUNTER — Ambulatory Visit (HOSPITAL_COMMUNITY)
Admission: RE | Admit: 2019-12-31 | Discharge: 2019-12-31 | Disposition: A | Discharge status: Home / Self Care | Payer: Self-pay | Source: Ambulatory Visit | Attending: Adult Health | Admitting: Adult Health

## 2019-12-31 ENCOUNTER — Other Ambulatory Visit: Payer: Self-pay

## 2019-12-31 VITALS — BP 128/85 | HR 82 | Wt 228.0 lb

## 2019-12-31 DIAGNOSIS — I509 Heart failure, unspecified: Secondary | ICD-10-CM

## 2019-12-31 DIAGNOSIS — I480 Paroxysmal atrial fibrillation: Secondary | ICD-10-CM | POA: Insufficient documentation

## 2019-12-31 DIAGNOSIS — N858 Other specified noninflammatory disorders of uterus: Secondary | ICD-10-CM | POA: Insufficient documentation

## 2019-12-31 DIAGNOSIS — Z87448 Personal history of other diseases of urinary system: Secondary | ICD-10-CM | POA: Insufficient documentation

## 2019-12-31 DIAGNOSIS — Z7901 Long term (current) use of anticoagulants: Secondary | ICD-10-CM | POA: Insufficient documentation

## 2019-12-31 DIAGNOSIS — I110 Hypertensive heart disease with heart failure: Secondary | ICD-10-CM | POA: Insufficient documentation

## 2019-12-31 DIAGNOSIS — I493 Ventricular premature depolarization: Secondary | ICD-10-CM | POA: Insufficient documentation

## 2019-12-31 DIAGNOSIS — N179 Acute kidney failure, unspecified: Secondary | ICD-10-CM

## 2019-12-31 DIAGNOSIS — E1165 Type 2 diabetes mellitus with hyperglycemia: Secondary | ICD-10-CM | POA: Insufficient documentation

## 2019-12-31 DIAGNOSIS — I313 Pericardial effusion (noninflammatory): Secondary | ICD-10-CM | POA: Insufficient documentation

## 2019-12-31 DIAGNOSIS — Z8249 Family history of ischemic heart disease and other diseases of the circulatory system: Secondary | ICD-10-CM | POA: Insufficient documentation

## 2019-12-31 DIAGNOSIS — I10 Essential (primary) hypertension: Secondary | ICD-10-CM

## 2019-12-31 DIAGNOSIS — Z794 Long term (current) use of insulin: Secondary | ICD-10-CM | POA: Insufficient documentation

## 2019-12-31 DIAGNOSIS — N200 Calculus of kidney: Secondary | ICD-10-CM | POA: Insufficient documentation

## 2019-12-31 DIAGNOSIS — I5022 Chronic systolic (congestive) heart failure: Secondary | ICD-10-CM | POA: Insufficient documentation

## 2019-12-31 DIAGNOSIS — R008 Other abnormalities of heart beat: Secondary | ICD-10-CM | POA: Insufficient documentation

## 2019-12-31 DIAGNOSIS — Z79899 Other long term (current) drug therapy: Secondary | ICD-10-CM | POA: Insufficient documentation

## 2019-12-31 LAB — BASIC METABOLIC PANEL
Anion gap: 10 (ref 5–15)
BUN: 31 mg/dL — ABNORMAL HIGH (ref 6–20)
CO2: 27 mmol/L (ref 22–32)
Calcium: 9.2 mg/dL (ref 8.9–10.3)
Chloride: 105 mmol/L (ref 98–111)
Creatinine, Ser: 1.82 mg/dL — ABNORMAL HIGH (ref 0.44–1.00)
GFR calc Af Amer: 37 mL/min — ABNORMAL LOW (ref >60)
GFR calc non Af Amer: 32 mL/min — ABNORMAL LOW (ref >60)
Glucose, Bld: 225 mg/dL — ABNORMAL HIGH (ref 70–99)
Potassium: 3.2 mmol/L — ABNORMAL LOW (ref 3.5–5.1)
Sodium: 142 mmol/L (ref 135–145)

## 2019-12-31 MED ORDER — CARVEDILOL 25 MG PO TABS: 25.0000 mg | ORAL_TABLET | Freq: Two times a day (BID) | ORAL | 3 refills | Status: DC

## 2019-12-31 MED FILL — CARVEDILOL 25 MG TABLET: 25 | 30 days supply | Qty: 60 | Fill #0

## 2019-12-31 NOTE — Patient Instructions (Addendum)
Lab work done today. We will notify you of any abnormal lab work. No news is good news!  EKG done today.  INCREASE Carvedilol to 25mg  tab two times daily.  Please keep follow up appointment in February 2021.  At the [facility], you and your health needs are our priority. As part of our continuing mission to provide you with exceptional heart care, we have created designated Provider Care Teams. These Care Teams include your primary Cardiologist (physician) and Advanced Practice Providers (APPs- Physician Assistants and Nurse Practitioners) who all work together to provide you with the care you need, when you need it.   You may see any of the following providers on your designated Care Team at your next follow up: Marland Kitchen Dr [name] . Dr Loralie Champagne . Darrick Grinder, NP . Lyda Jester, PA . Audry Riles, PharmD   Please be sure to bring in all your medications bottles to every appointment.

## 2020-01-01 ENCOUNTER — Encounter: Payer: Self-pay | Admitting: Internal Medicine

## 2020-01-01 ENCOUNTER — Ambulatory Visit (INDEPENDENT_AMBULATORY_CARE_PROVIDER_SITE_OTHER): Payer: Self-pay | Admitting: Internal Medicine

## 2020-01-01 VITALS — BP 160/86 | HR 79 | Ht 68.0 in | Wt 224.8 lb

## 2020-01-01 DIAGNOSIS — I493 Ventricular premature depolarization: Secondary | ICD-10-CM

## 2020-01-01 DIAGNOSIS — I5022 Chronic systolic (congestive) heart failure: Secondary | ICD-10-CM

## 2020-01-01 DIAGNOSIS — I4891 Unspecified atrial fibrillation: Secondary | ICD-10-CM

## 2020-01-01 NOTE — Addendum Note (Signed)
Encounter addended by: Micki Riley, RN on: 01/01/2020 10:41 AM  Actions taken: Imaging Exam ended

## 2020-01-01 NOTE — Patient Instructions (Addendum)
Medication Instructions:  Your physician recommends that you continue on your current medications as directed. Please refer to the Current Medication list given to you today.  Labwork: None ordered.  Testing/Procedures: None ordered.  Follow-Up: Your physician wants you to follow-up in:  Will contact you regarding f/u.  Any Other Special Instructions Will Be Listed Below (If Applicable).  If you need a refill on your cardiac medications before your next appointment, please call your pharmacy.

## 2020-01-01 NOTE — Progress Notes (Signed)
HPI Adrienne Lane is referred by Darrick Grinder for evaluation of PVC's and PAF in the setting of a DCM, chronic renal insufficiency and LV dysfunction. She is an obese middle aged woman who presented with anasarca in late October and ultimately was diuresed over 90 lbs. She has continued to lose weight. She is breathing much better. She was seen in our CHF clinic yesterday and noted to be in ventricular bigeminy. She denies a h/o syncope. Her renal function has improved. Her dyspnea is class 2. Her peripheral edema is much improved. She feels her PVC's when she lies down to sleep at night. She wore a cardiac monitor which demonstrated 19% PVC's. Mostly monomorphic. No Known Allergies   Current Outpatient Medications  Medication Sig Dispense Refill  . apixaban (ELIQUIS) 5 MG TABS tablet Take 1 tablet (5 mg total) by mouth 2 (two) times daily. 60 tablet 6  . BLACK ELDERBERRY PO Take by mouth.    . carvedilol (COREG) 25 MG tablet Take 1 tablet (25 mg total) by mouth 2 (two) times daily with a meal. 180 tablet 3  . Cholecalciferol (VITAMIN D3) 10 MCG (400 UNIT) tablet Take 400 Units by mouth daily.    Marland Kitchen glucose blood test strip Use as instructed 100 each 12  . hydrALAZINE (APRESOLINE) 100 MG tablet Take 1 tablet (100 mg total) by mouth every 8 (eight) hours. 90 tablet 6  . insulin glargine (LANTUS) 100 UNIT/ML injection Inject 0.12 mLs (12 Units total) into the skin daily. 10 mL 11  . Insulin Syringe-Needle U-100 (INSULIN SYRINGE .5CC/30GX1/2") 30G X 1/2" 0.5 ML MISC 12 Units by Does not apply route at bedtime. 100 each 6  . isosorbide mononitrate (IMDUR) 60 MG 24 hr tablet Take 1 tablet (60 mg total) by mouth daily. 30 tablet 6  . Lancets (ONETOUCH ULTRASOFT) lancets Check CBG twice a day 60 each 5  . mexiletine (MEXITIL) 150 MG capsule Take 2 capsules (300 mg total) by mouth every 12 (twelve) hours. 120 capsule 6  . Multiple Vitamins-Minerals (WOMENS MULTI PO) Take 1 tablet by mouth daily.    .  potassium chloride SA (KLOR-CON) 20 MEQ tablet Take 1 tablet (20 mEq total) by mouth daily. 90 tablet 3  . torsemide (DEMADEX) 20 MG tablet Take 2 tablets (40 mg total) by mouth 2 (two) times daily. 120 tablet 6   No current facility-administered medications for this visit.     Past Medical History:  Diagnosis Date  . Diabetes mellitus without complication ([place])   . Hypertension     ROS:   All systems reviewed and negative except as noted in the HPI.   Past Surgical History:  Procedure Laterality Date  . CHOLECYSTECTOMY    . RIGHT HEART CATH N/A 11/09/2019   Procedure: RIGHT HEART CATH;  Surgeon: Jolaine Artist, MD;  Location: [place] [facility];  Service: Cardiovascular;  Laterality: N/A;     Family History  Problem Relation Age of Onset  . Atrial fibrillation Mother      Social History   Socioeconomic History  . Marital status: Married    Spouse name: Not on file  . Number of children: Not on file  . Years of education: Not on file  . Highest education level: Not on file  Occupational History  . Not on file  Tobacco Use  . Smoking status: Never Smoker  . Smokeless tobacco: Never Used  Substance and Sexual Activity  . Alcohol use: Never  .  Drug use: Never  . Sexual activity: Not on file  Other Topics Concern  . Not on file  Social History Narrative  . Not on file   Social Determinants of Health   Financial Resource Strain:   . Difficulty of Paying Living Expenses: Not on file  Food Insecurity:   . Worried About Charity fundraiser in the Last Year: Not on file  . Ran Out of Food in the Last Year: Not on file  Transportation Needs:   . Lack of Transportation (Medical): Not on file  . Lack of Transportation (Non-Medical): Not on file  Physical Activity:   . Days of Exercise per Week: Not on file  . Minutes of Exercise per Session: Not on file  Stress:   . Feeling of Stress : Not on file  Social Connections:   . Frequency of Communication with  Friends and Family: Not on file  . Frequency of Social Gatherings with Friends and Family: Not on file  . Attends Religious Services: Not on file  . Active Member of Clubs or Organizations: Not on file  . Attends Archivist Meetings: Not on file  . Marital Status: Not on file  Intimate Partner Violence:   . Fear of Current or Ex-Partner: Not on file  . Emotionally Abused: Not on file  . Physically Abused: Not on file  . Sexually Abused: Not on file     BP (!) 160/86   Pulse 79   Ht 5\' 8"  (1.727 m)   Wt 224 lb 12.8 oz (102 kg)   SpO2 97%   BMI 34.18 kg/m   Physical Exam:  Well appearing NAD HEENT: Unremarkable Neck:  No JVD, no thyromegally Lymphatics:  No adenopathy Back:  No CVA tenderness Lungs:  Clear with no wheezes HEART:  Regular rate rhythm, no murmurs, no rubs, no clicks Abd:  soft, positive bowel sounds, no organomegally, no rebound, no guarding Ext:  2 plus pulses, no edema, no cyanosis, no clubbing Skin:  No rashes no nodules Neuro:  CN II through XII intact, motor grossly intact  EKG - reviewed NSR with PVC's with a Left bundle inferior axis, transition to positive at V3.     Assess/Plan: 1. Non-ischemic CM - her EF is poor. She will have this repeated and if her EF remains down, she will likely need ICD insertion for primary prevention. 2. PVC's - there is a possibility the PVC's are contributing to her CM. However, she failed amiodarone therapy in the hospital, albeit when she was much sicker. I will discuss PVC ablation with Dr. Reine Just. If the patient is willing we would consider an attempt at [facility] ablation as she appears to have failed medical therapy. I do not think the mexitil is adding much. 3. Obesity - she continues to lose weight. I encouraged her. 4. Chronic renal insufficiency -her creatinine has continued to improve and is probably near her baseline.  Mikle Bosworth.D.

## 2020-01-04 ENCOUNTER — Telehealth (HOSPITAL_COMMUNITY): Payer: Self-pay | Admitting: Cardiology

## 2020-01-04 MED ORDER — POTASSIUM CHLORIDE CRYS ER 20 MEQ PO TBCR: 40.0000 meq | EXTENDED_RELEASE_TABLET | Freq: Every day | ORAL | 3 refills | Status: DC

## 2020-01-04 MED FILL — POTASSIUM CL ER 20 MEQ TAB: 20 | 30 days supply | Qty: 60 | Fill #0

## 2020-01-04 NOTE — Telephone Encounter (Signed)
Pt aware and voiced understanding 

## 2020-01-04 NOTE — Telephone Encounter (Signed)
-----   Message from Conrad Bradford, NP sent at 12/31/2019 12:44 PM EST ----- Renal function improving. Increase potassium to 40 meq daily.

## 2020-01-16 ENCOUNTER — Telehealth (HOSPITAL_COMMUNITY): Payer: Self-pay | Admitting: Cardiology

## 2020-01-16 NOTE — Telephone Encounter (Signed)
Disability/physician accommodation form completed Faxed 01/14/20, patient aware and copy mailed to patient. Sent documents to be scanned into chart

## 2020-01-17 ENCOUNTER — Other Ambulatory Visit: Payer: Self-pay | Admitting: Urology

## 2020-01-17 ENCOUNTER — Other Ambulatory Visit (HOSPITAL_COMMUNITY): Payer: Self-pay

## 2020-01-21 DIAGNOSIS — I498 Other specified cardiac arrhythmias: Secondary | ICD-10-CM

## 2020-01-21 HISTORY — DX: Other specified cardiac arrhythmias: I49.8

## 2020-01-21 MED FILL — hydrALAZINE HCL 100 MG TABS: 100 | 30 days supply | Qty: 90 | Fill #1

## 2020-01-22 MED FILL — ISOSORBIDE MN ER 60 MG TAB: 60 | 30 days supply | Qty: 30 | Fill #2

## 2020-01-22 MED FILL — TORSEMIDE 20 MG TABLET: 20 | 30 days supply | Qty: 120 | Fill #2

## 2020-01-31 MED FILL — !LANTUS 100 UNITS/ML VIAL: 100 | 28 days supply | Qty: 10 | Fill #1

## 2020-01-31 MED FILL — MEXILETINE HCL 150 MG CAPS: 150 | 30 days supply | Qty: 120 | Fill #2

## 2020-01-31 MED FILL — ELIQUIS 5 MG TABLET: 5 | 30 days supply | Qty: 60 | Fill #2

## 2020-01-31 MED FILL — CARVEDILOL 25 MG TABLET: 25 | 30 days supply | Qty: 60 | Fill #0

## 2020-02-01 ENCOUNTER — Encounter (HOSPITAL_COMMUNITY): Payer: Self-pay

## 2020-02-01 NOTE — Patient Instructions (Addendum)
DUE TO COVID-19 ONLY ONE VISITOR IS ALLOWED TO COME WITH YOU AND STAY IN THE WAITING ROOM ONLY DURING PRE OP AND PROCEDURE. THE ONE VISITOR MAY VISIT WITH YOU IN YOUR PRIVATE ROOM DURING VISITING HOURS ONLY!!   COVID SWAB TESTING MUST BE COMPLETED ON:  Monday, Feb. 22, 2021 at 9:40 AM [address] [facility] (Must self quarantine after testing. Follow instructions on handout.)             Your procedure is scheduled on: Thursday, Feb. 25, 2021   Report to [facility] Main  Entrance   Report to Short Stay at 5:30 AM   Call this number if you have problems the morning of surgery [phone]   Do not eat food or drink liquids :After Midnight.   Oral Hygiene is also important to reduce your risk of infection.                                    Remember - BRUSH YOUR TEETH THE MORNING OF SURGERY WITH YOUR REGULAR TOOTHPASTE   Do NOT smoke after Midnight   Take these medicines the morning of surgery with A SIP OF WATER: Carvedilol, Hydralazine, Isosorbide, Mexiletine, Amlodipine   TAKE LANTUS 7.5 UNITS THE MORNING OF SURGERY  DO NOT TAKE ANY ORAL DIABETIC MEDICATIONS DAY OF YOUR SURGERY                               You may not have any metal on your body including hair pins, jewelry, and body piercings             Do not wear make-up, lotions, powders, perfumes/cologne, or deodorant             Do not wear nail polish.  Do not shave  48 hours prior to surgery.               Do not bring valuables to the hospital. [place].   Contacts, dentures or bridgework may not be worn into surgery.   Bring small overnight bag day of surgery.    Special Instructions: Bring a copy of your healthcare power of attorney and living will documents         the day of surgery if you haven't scanned them in before.              Please read over the following fact sheets you  were given:  How to Manage Your Diabetes Before and After Surgery  Why is it important to control my blood sugar before and after surgery? . Improving blood sugar levels before and after surgery helps healing and can limit problems. . A way of improving blood sugar control is eating a healthy diet by: o  Eating less sugar and carbohydrates o  Increasing activity/exercise o  Talking with your doctor about reaching your blood sugar goals . High blood sugars (greater than 180 mg/dL) can raise your risk of infections and slow your recovery, so you will need to focus on controlling your diabetes during the weeks before surgery. . Make sure that the doctor who takes care of your diabetes knows about your planned surgery including the date  and location.  How do I manage my blood sugar before surgery? . Check your blood sugar at least 4 times a day, starting 2 days before surgery, to make sure that the level is not too high or low. o Check your blood sugar the morning of your surgery when you wake up and every 2 hours until you get to the Short Stay unit. . If your blood sugar is less than 70 mg/dL, you will need to treat for low blood sugar: o Do not take insulin. o Treat a low blood sugar (less than 70 mg/dL) with  cup of clear juice (cranberry or apple), 4 glucose tablets, OR glucose gel. o Recheck blood sugar in 15 minutes after treatment (to make sure it is greater than 70 mg/dL). If your blood sugar is not greater than 70 mg/dL on recheck, call [phone] for further instructions. . Report your blood sugar to the short stay nurse when you get to Short Stay.  . If you are admitted to the hospital after surgery: o Your blood sugar will be checked by the staff and you will probably be given insulin after surgery (instead of oral diabetes medicines) to make sure you have good blood sugar levels. o The goal for blood sugar control after surgery is 80-180 mg/dL.   WHAT DO I DO ABOUT MY  DIABETES MEDICATION?  Marland Kitchen Do not take oral diabetes medicines (pills) the morning of surgery.  . THE DAY BEFORE SURGERY, take  15   units of   LANTUS    Insulin        . THE MORNING OF SURGERY, take 7.5   units of  LANTUS       insulin.  Reviewed and Endorsed by [facility] Patient Education Committee, August 2015  [facility] - Preparing for Surgery Before surgery, you can play an important role.  Because skin is not sterile, your skin needs to be as free of germs as possible.  You can reduce the number of germs on your skin by washing with CHG (chlorahexidine gluconate) soap before surgery.  CHG is an antiseptic cleaner which kills germs and bonds with the skin to continue killing germs even after washing. Please DO NOT use if you have an allergy to CHG or antibacterial soaps.  If your skin becomes reddened/irritated stop using the CHG and inform your nurse when you arrive at Short Stay. Do not shave (including legs and underarms) for at least 48 hours prior to the first CHG shower.  You may shave your face/neck.  Please follow these instructions carefully:  1.  Shower with CHG Soap the night before surgery and the  morning of surgery.  2.  If you choose to wash your hair, wash your hair first as usual with your normal  shampoo.  3.  After you shampoo, rinse your hair and body thoroughly to remove the shampoo.                             4.  Use CHG as you would any other liquid soap.  You can apply chg directly to the skin and wash.  Gently with a scrungie or clean washcloth.  5.  Apply the CHG Soap to your body ONLY FROM THE NECK DOWN.   Do   not use on face/ open  Wound or open sores. Avoid contact with eyes, ears mouth and   genitals (private parts).                       Wash face,  Genitals (private parts) with your normal soap.             6.  Wash thoroughly, paying special attention to the area where your    surgery  will be performed.  7.  Thoroughly rinse  your body with warm water from the neck down.  8.  DO NOT shower/wash with your normal soap after using and rinsing off the CHG Soap.                9.  Pat yourself dry with a clean towel.            10.  Wear clean pajamas.            11.  Place clean sheets on your bed the night of your first shower and do not  sleep with pets. Day of Surgery : Do not apply any lotions/deodorants the morning of surgery.  Please wear clean clothes to the hospital/surgery center.  FAILURE TO FOLLOW THESE INSTRUCTIONS MAY RESULT IN THE CANCELLATION OF YOUR SURGERY  PATIENT SIGNATURE_________________________________  NURSE SIGNATURE__________________________________  ________________________________________________________________________

## 2020-02-04 ENCOUNTER — Other Ambulatory Visit (HOSPITAL_COMMUNITY): Payer: Self-pay | Admitting: Internal Medicine

## 2020-02-04 ENCOUNTER — Other Ambulatory Visit: Payer: Self-pay

## 2020-02-04 ENCOUNTER — Ambulatory Visit (HOSPITAL_BASED_OUTPATIENT_CLINIC_OR_DEPARTMENT_OTHER)
Admission: RE | Admit: 2020-02-04 | Discharge: 2020-02-04 | Disposition: A | Discharge status: Home / Self Care | Payer: Self-pay | Source: Ambulatory Visit | Attending: Internal Medicine | Admitting: Internal Medicine

## 2020-02-04 ENCOUNTER — Ambulatory Visit (HOSPITAL_COMMUNITY)
Admission: RE | Admit: 2020-02-04 | Discharge: 2020-02-04 | Disposition: A | Discharge status: Home / Self Care | Payer: Self-pay | Source: Ambulatory Visit | Attending: Cardiology | Admitting: Cardiology

## 2020-02-04 ENCOUNTER — Encounter (HOSPITAL_COMMUNITY): Payer: Self-pay | Admitting: Internal Medicine

## 2020-02-04 VITALS — BP 150/72 | HR 70 | Wt 224.5 lb

## 2020-02-04 DIAGNOSIS — I493 Ventricular premature depolarization: Secondary | ICD-10-CM | POA: Insufficient documentation

## 2020-02-04 DIAGNOSIS — Z7901 Long term (current) use of anticoagulants: Secondary | ICD-10-CM | POA: Insufficient documentation

## 2020-02-04 DIAGNOSIS — Z79899 Other long term (current) drug therapy: Secondary | ICD-10-CM | POA: Insufficient documentation

## 2020-02-04 DIAGNOSIS — I5022 Chronic systolic (congestive) heart failure: Secondary | ICD-10-CM | POA: Insufficient documentation

## 2020-02-04 DIAGNOSIS — I313 Pericardial effusion (noninflammatory): Secondary | ICD-10-CM | POA: Insufficient documentation

## 2020-02-04 DIAGNOSIS — I480 Paroxysmal atrial fibrillation: Secondary | ICD-10-CM | POA: Insufficient documentation

## 2020-02-04 DIAGNOSIS — I10 Essential (primary) hypertension: Secondary | ICD-10-CM

## 2020-02-04 DIAGNOSIS — I130 Hypertensive heart and chronic kidney disease with heart failure and stage 1 through stage 4 chronic kidney disease, or unspecified chronic kidney disease: Secondary | ICD-10-CM | POA: Insufficient documentation

## 2020-02-04 DIAGNOSIS — N200 Calculus of kidney: Secondary | ICD-10-CM | POA: Insufficient documentation

## 2020-02-04 DIAGNOSIS — N1832 Chronic kidney disease, stage 3b: Secondary | ICD-10-CM | POA: Insufficient documentation

## 2020-02-04 DIAGNOSIS — Z6832 Body mass index (BMI) 32.0-32.9, adult: Secondary | ICD-10-CM | POA: Insufficient documentation

## 2020-02-04 DIAGNOSIS — Z794 Long term (current) use of insulin: Secondary | ICD-10-CM | POA: Insufficient documentation

## 2020-02-04 DIAGNOSIS — E1122 Type 2 diabetes mellitus with diabetic chronic kidney disease: Secondary | ICD-10-CM | POA: Insufficient documentation

## 2020-02-04 DIAGNOSIS — I509 Heart failure, unspecified: Secondary | ICD-10-CM

## 2020-02-04 DIAGNOSIS — I2510 Atherosclerotic heart disease of native coronary artery without angina pectoris: Secondary | ICD-10-CM

## 2020-02-04 MED ORDER — AMLODIPINE BESYLATE 5 MG PO TABS: 5.0000 mg | ORAL_TABLET | Freq: Every day | ORAL | 6 refills | Status: DC

## 2020-02-04 MED ORDER — POTASSIUM CHLORIDE ER 10 MEQ PO TBCR: 40.0000 meq | EXTENDED_RELEASE_TABLET | Freq: Every day | ORAL | 6 refills | Status: DC

## 2020-02-04 MED FILL — AMLODIPINE BESYLATE 5 MG TA: 5 | 30 days supply | Qty: 30 | Fill #0

## 2020-02-04 MED FILL — POTASSIUM CHLORIDE ER 10 ME: 10 | 30 days supply | Qty: 120 | Fill #0

## 2020-02-04 NOTE — Patient Instructions (Signed)
Restart Potassium at 40 meq (4 tabs) daily, we have sent you in a prescription for the 10 meq tabs so they will be easier for you to swallow  Start Amlodipine 5 mg daily  Your physician recommends that you schedule a follow-up appointment in: 4 months  If you have any questions or concerns before your next appointment please send [place] a message through [place] or call our office at [phone].  At the [facility], you and your health needs are our priority. As part of our continuing mission to provide you with exceptional heart care, we have created designated Provider Care Teams. These Care Teams include your primary Cardiologist (physician) and Advanced Practice Providers (APPs- Physician Assistants and Nurse Practitioners) who all work together to provide you with the care you need, when you need it.   You may see any of the following providers on your designated Care Team at your next follow up: Marland Kitchen Dr [name] . Dr Loralie Champagne . Darrick Grinder, NP . Lyda Jester, PA . Audry Riles, PharmD   Please be sure to bring in all your medications bottles to every appointment.

## 2020-02-04 NOTE — Progress Notes (Signed)
  Echocardiogram 2D Echocardiogram has been performed.  Roylene Heaton A Audray Rumore 02/04/2020, 11:00 AM

## 2020-02-04 NOTE — Progress Notes (Signed)
PCP: Dr Adrienne Lane  Primary Cardiologist: Dr Adrienne Lane HF MD: Dr Adrienne Lane Nephorology: Dr Adrienne Lane   HPI: Adrienne Lane is a 54 year old with a history of morbid obesity, HTN, DM, PAF, PVCs, and chronic systolic HF.   Admitted to [facility] 10/20/19 after a mechanical fall and landed on her hip. ECHO showed EF 20-25% with severe RV dysfunction as well. 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 Started on amio/ heparin drip and IV lasix. Renal function worsened. Abdominal Xray showed large staghorn calculi. CT Renal Stone showed bilateral staghorn calculi without hydronephrosis and severe anasarca. Urology consulted on 11/3 with mention of possible nephrostomy tubes but renal US was negative for hydronephrosis. GYN consulted for uterine mass. Mass was thought to be large fibroid that will need to follow up after discharge. Nephrology consulted for worsening renal failure. Transferred to [facility] for CVVHD. Overall diuresed 90 pounds. Later transitioned to torsemide 40 mg daily. Placed on carvedilol and mexiletine due to high PVC burden. Discharged on 11/12/19 with weight 255 pounds.   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 Saw Dr. [name] who was considering PVC ablation. Doubted mexilitene is helping much.  Returns for HF f/u. Feels ok. Just tired. Can do basic activities and go to store but gets tired easily. Can go up steps if she takes her time. Weight down to 225. No edema, orthopnea or PND. Occasional palpitations especially when lying on left side.   Echo today EF 35-40% hard to assess with PVCs. RV mildly down No effusion.   ECHO 10/21/2019. EF 20-25%, pericardial effusion, mild LVH, RA/LA dilated. RV down.   [place] 11/12/19  RA = 11 RV = 47/15 PA = 49/17 (32) PCW = 20 Fick cardiac output/index = 8.8/3.8 PVR = 1.4 WU FA sat = 97% PA sat = 70%, 72% High SVC = 68%  ROS: All systems negative except as listed in HPI, PMH and Problem  List.  SH:  Social History   Socioeconomic History  . Marital status: Married    Spouse name: Not on file  . Number of children: Not on file  . Years of education: Not on file  . Highest education level: Not on file  Occupational History  . Not on file  Tobacco Use  . Smoking status: Never Smoker  . Smokeless tobacco: Never Used  Substance and Sexual Activity  . Alcohol use: Never  . Drug use: Never  . Sexual activity: Not on file  Other Topics Concern  . Not on file  Social History Narrative  . Not on file   Social Determinants of Health   Financial Resource Strain:   . Difficulty of Paying Living Expenses: Not on file  Food Insecurity:   . Worried About Charity fundraiser in the Last Year: Not on file  . Ran Out of Food in the Last Year: Not on file  Transportation Needs:   . Lack of Transportation (Medical): Not on file  . Lack of Transportation (Non-Medical): Not on file  Physical Activity:   . Days of Exercise per Week: Not on file  . Minutes of Exercise per Session: Not on file  Stress:   . Feeling of Stress : Not on file  Social Connections:   . Frequency of Communication with Friends and Family: Not on file  . Frequency of Social Gatherings with Friends and Family: Not on file  . Attends Religious Services: Not on file  . Active Member of Clubs  or Organizations: Not on file  . Attends Archivist Meetings: Not on file  . Marital Status: Not on file  Intimate Partner Violence:   . Fear of Current or Ex-Partner: Not on file  . Emotionally Abused: Not on file  . Physically Abused: Not on file  . Sexually Abused: Not on file    FH:  Family History  Problem Relation Age of Onset  . Atrial fibrillation Mother     Past Medical History:  Diagnosis Date  . Atrial fibrillation with RVR ([place])   . CHF (congestive heart failure) ([place])   . Chronic renal insufficiency   . Diabetes mellitus without complication ([place])   . Fibroids   . History of  kidney stones   . Hypertension   . LV dysfunction   . Non-ischemic cardiomyopathy ([place])   . Obese   . Peripheral edema   . Premature ventricular contractions (PVCs) (VPCs)   . Umbilical hernia     Current Outpatient Medications  Medication Sig Dispense Refill  . apixaban (ELIQUIS) 5 MG TABS tablet Take 1 tablet (5 mg total) by mouth 2 (two) times daily. 60 tablet 6  . carvedilol (COREG) 25 MG tablet Take 1 tablet (25 mg total) by mouth 2 (two) times daily with a meal. 180 tablet 3  . glucose blood test strip Use as instructed 100 each 12  . hydrALAZINE (APRESOLINE) 100 MG tablet Take 1 tablet (100 mg total) by mouth every 8 (eight) hours. 90 tablet 6  . insulin glargine (LANTUS) 100 UNIT/ML injection Inject 15 Units into the skin daily.    . Insulin Syringe-Needle U-100 (INSULIN SYRINGE .5CC/30GX1/2") 30G X 1/2" 0.5 ML MISC 12 Units by Does not apply route at bedtime. 100 each 6  . isosorbide mononitrate (IMDUR) 60 MG 24 hr tablet Take 1 tablet (60 mg total) by mouth daily. 30 tablet 6  . Lancets (ONETOUCH ULTRASOFT) lancets Check CBG twice a day 60 each 5  . mexiletine (MEXITIL) 150 MG capsule Take 2 capsules (300 mg total) by mouth every 12 (twelve) hours. 120 capsule 6  . Multiple Vitamins-Minerals (WOMENS MULTI PO) Take 1 tablet by mouth daily.    . Nutritional Supplements (NUTRITIONAL SHAKE PO) Take by mouth. For potassium    . torsemide (DEMADEX) 20 MG tablet Take 2 tablets (40 mg total) by mouth 2 (two) times daily. 120 tablet 6   No current facility-administered medications for this encounter.    Vitals:   02/04/20 1101  BP: (!) 150/72  Pulse: 70  SpO2: 99%  Weight: 115.8 kg (255 lb 3.2 oz)   Wt Readings from Last 3 Encounters:  02/04/20 115.8 kg (255 lb 3.2 oz)  01/01/20 102 kg (224 lb 12.8 oz)  12/31/19 103.4 kg (228 lb)    PHYSICAL EXAM: General:  Well appearing. No resp difficulty HEENT: normal Neck: supple. no JVD. Carotids 2+ bilat; no bruits. No  lymphadenopathy or thryomegaly appreciated. Cor: PMI nondisplaced. irregular rate & rhythm. No rubs, gallops or murmurs. Lungs: clear Abdomen: obese soft, nontender, nondistended. No hepatosplenomegaly. No bruits or masses. Good bowel sounds. Extremities: no cyanosis, clubbing, rash, edema Neuro: alert & orientedx3, cranial nerves grossly intact. moves all 4 extremities w/o difficulty. Affect pleasant   EKG: SR with frequent PVCs 79 bpm    ASSESSMENT & PLAN: 1. Chronic  Systolic Heart Failure ECHO 10/2019  EF 20-25%, pericardial effusion, mild LVH, RA/LA dilated.  RV . Possible Tachy Mediated cardiomyopathy verus PVC. She has not had LHC  due to elevated creatinine. Had [place] 11/23 with preserved cardiac output.  - Echo today EF 35-40% (Hard to assess with PVCs) RV mild HK. No effusion Personally reviewed - NYHA II-early III - Volume status ok   - Continue torsemide 40 mg daily.  - Continue carvedilol 25 bid - No spiro/dig/arb with CKD. Recent creatinine 1.82 (down from 2.97). If creatinine stays under 2.0 would consider Entresto +/- SGLT2i (after she has nephrolithotomy) - Continue hydralazine 100 mg three times a day + imdur 60 mg daily.  - can't tolerate kdur pills will switch to 10meq pills. If doesn't tolerate use liquid   2. 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 Saw Dr. [name] who was considering PVC ablation. Doubted mexilitene is helping much. - We discussed and we both favor attempt at [facility] ablation when other medical issues sorted out. Has f/u with Dr. [name] 03/04/20  3. PAF  - Maintaining NSR.  - Continue carvedilol  - Continue eliquis 5 mg twice a day. Ok to hold for surgery for 2 days  4. CKD 3b - recent creatinine 1.8 (down from 2.97)  - Follows with Nephrology  5. HTN  - still a bit high - add amldopine 5 daily   6. H/O AKI  - Recent admit  peaked at 3.4 - Urology saw--. Bilateral staghorn calculi. No hydronephrosis so no role for percutaneous nephrostomy tubes.  - She is scheduled for L percutaneous nephrolithotomy with Dr. Louis Lane 02/14/20 - Creatinine down 2.97 -> 2.33 -> 1.82 - Check BMET today    7. Uncontrolled DM - Hgb A1C 11.7  - She has been seen by PCP with adjustments made.  - Recheck today  8. Uterine Mass - Likely fibroid per GYN.  - She has had GYN F/U and be seen again in 6 month.   9. Possible OSA with snoring - needs sleep study - complicated by lack of health insurance - can order when she gets Medicaid  [name] MD  11:37 AM

## 2020-02-05 ENCOUNTER — Ambulatory Visit: Payer: Self-pay | Admitting: Internal Medicine

## 2020-02-05 ENCOUNTER — Encounter (HOSPITAL_COMMUNITY): Payer: Self-pay

## 2020-02-05 ENCOUNTER — Encounter (HOSPITAL_COMMUNITY)
Admission: RE | Admit: 2020-02-05 | Discharge: 2020-02-05 | Disposition: A | Discharge status: Home / Self Care | Payer: Self-pay | Source: Ambulatory Visit | Attending: Urology | Admitting: Urology

## 2020-02-05 ENCOUNTER — Other Ambulatory Visit: Payer: Self-pay

## 2020-02-05 DIAGNOSIS — Z7901 Long term (current) use of anticoagulants: Secondary | ICD-10-CM | POA: Insufficient documentation

## 2020-02-05 DIAGNOSIS — I509 Heart failure, unspecified: Secondary | ICD-10-CM | POA: Insufficient documentation

## 2020-02-05 DIAGNOSIS — E669 Obesity, unspecified: Secondary | ICD-10-CM | POA: Insufficient documentation

## 2020-02-05 DIAGNOSIS — I429 Cardiomyopathy, unspecified: Secondary | ICD-10-CM | POA: Insufficient documentation

## 2020-02-05 DIAGNOSIS — I083 Combined rheumatic disorders of mitral, aortic and tricuspid valves: Secondary | ICD-10-CM | POA: Insufficient documentation

## 2020-02-05 DIAGNOSIS — N183 Chronic kidney disease, stage 3 unspecified: Secondary | ICD-10-CM | POA: Insufficient documentation

## 2020-02-05 DIAGNOSIS — I4891 Unspecified atrial fibrillation: Secondary | ICD-10-CM | POA: Insufficient documentation

## 2020-02-05 DIAGNOSIS — Z794 Long term (current) use of insulin: Secondary | ICD-10-CM | POA: Insufficient documentation

## 2020-02-05 DIAGNOSIS — Z6832 Body mass index (BMI) 32.0-32.9, adult: Secondary | ICD-10-CM | POA: Insufficient documentation

## 2020-02-05 DIAGNOSIS — N200 Calculus of kidney: Secondary | ICD-10-CM | POA: Insufficient documentation

## 2020-02-05 DIAGNOSIS — Z01812 Encounter for preprocedural laboratory examination: Secondary | ICD-10-CM | POA: Insufficient documentation

## 2020-02-05 DIAGNOSIS — I130 Hypertensive heart and chronic kidney disease with heart failure and stage 1 through stage 4 chronic kidney disease, or unspecified chronic kidney disease: Secondary | ICD-10-CM | POA: Insufficient documentation

## 2020-02-05 DIAGNOSIS — E1122 Type 2 diabetes mellitus with diabetic chronic kidney disease: Secondary | ICD-10-CM | POA: Insufficient documentation

## 2020-02-05 DIAGNOSIS — Z9049 Acquired absence of other specified parts of digestive tract: Secondary | ICD-10-CM | POA: Insufficient documentation

## 2020-02-05 HISTORY — DX: Dyspnea, unspecified: R06.00

## 2020-02-05 HISTORY — DX: Personal history of urinary calculi: Z87.442

## 2020-02-05 HISTORY — DX: Other forms of dyspnea: R06.09

## 2020-02-05 HISTORY — DX: Localized edema: R60.0

## 2020-02-05 HISTORY — DX: Iron deficiency anemia, unspecified: D50.9

## 2020-02-05 HISTORY — DX: Other cardiomyopathies: I42.8

## 2020-02-05 HISTORY — DX: Ventricular premature depolarization: I49.3

## 2020-02-05 HISTORY — DX: Edema, unspecified: R60.9

## 2020-02-05 HISTORY — DX: Benign neoplasm of connective and other soft tissue, unspecified: D21.9

## 2020-02-05 HISTORY — DX: Obesity, unspecified: E66.9

## 2020-02-05 HISTORY — DX: Unspecified atrial fibrillation: I48.91

## 2020-02-05 HISTORY — DX: Umbilical hernia without obstruction or gangrene: K42.9

## 2020-02-05 NOTE — Progress Notes (Signed)
PCP - Dr. Etheleen Mayhew Cardiologist - Dr. Beckie Salts Last office visit 01/01/20 in epic, Dr. [name] 02/04/20 in epic  Chest x-ray - 10/29/2019 in epic EKG - 12/31/19 in epic Stress Test - N/A ECHO - 10/21/2019 in epic Cardiac Cath - 11/09/2019 in epic  Sleep Study - N/A CPAP - N/A  Fasting Blood Sugar - 131-177 Checks Blood Sugar ___once__ daily  Blood Thinner Instructions: Eliquis patient stated will stop 2 days prior to surgery Aspirin Instructions: N/A Last Dose: N/A  Anesthesia review: CHF, Afib RVR, Non ischemic cardiomyopathy, HTN, DM  Patient denies shortness of breath, fever, cough and chest pain at PAT appointment   Patient verbalized understanding of instructions that were given to them at the PAT appointment. Patient was also instructed that they will need to review over the PAT instructions again at home before surgery.

## 2020-02-06 ENCOUNTER — Encounter (HOSPITAL_COMMUNITY)
Admission: RE | Admit: 2020-02-06 | Discharge: 2020-02-06 | Disposition: A | Discharge status: Home / Self Care | Payer: Self-pay | Source: Ambulatory Visit | Attending: Urology | Admitting: Urology

## 2020-02-06 LAB — BASIC METABOLIC PANEL
Anion gap: 9 (ref 5–15)
BUN: 30 mg/dL — ABNORMAL HIGH (ref 6–20)
CO2: 28 mmol/L (ref 22–32)
Calcium: 8.9 mg/dL (ref 8.9–10.3)
Chloride: 104 mmol/L (ref 98–111)
Creatinine, Ser: 1.74 mg/dL — ABNORMAL HIGH (ref 0.44–1.00)
GFR calc Af Amer: 39 mL/min — ABNORMAL LOW (ref >60)
GFR calc non Af Amer: 33 mL/min — ABNORMAL LOW (ref >60)
Glucose, Bld: 237 mg/dL — ABNORMAL HIGH (ref 70–99)
Potassium: 3.4 mmol/L — ABNORMAL LOW (ref 3.5–5.1)
Sodium: 141 mmol/L (ref 135–145)

## 2020-02-06 LAB — CBC
HCT: 35.2 % — ABNORMAL LOW (ref 36.0–46.0)
Hemoglobin: 11.2 g/dL — ABNORMAL LOW (ref 12.0–15.0)
MCH: 28.6 pg (ref 26.0–34.0)
MCHC: 31.8 g/dL (ref 30.0–36.0)
MCV: 89.8 fL (ref 80.0–100.0)
Platelets: 189 10*3/uL (ref 150–400)
RBC: 3.92 MIL/uL (ref 3.87–5.11)
RDW: 15.2 % (ref 11.5–15.5)
WBC: 4.6 10*3/uL (ref 4.0–10.5)
nRBC: 0 % (ref 0.0–0.2)

## 2020-02-06 LAB — GLUCOSE, CAPILLARY: Glucose-Capillary: 242 mg/dL — ABNORMAL HIGH (ref 70–99)

## 2020-02-06 LAB — HEMOGLOBIN A1C
Hgb A1c MFr Bld: 8.4 % — ABNORMAL HIGH (ref 4.8–5.6)
Mean Plasma Glucose: 194.38 mg/dL

## 2020-02-07 ENCOUNTER — Other Ambulatory Visit: Payer: Self-pay | Admitting: Urology

## 2020-02-07 ENCOUNTER — Ambulatory Visit: Payer: Self-pay | Admitting: Internal Medicine

## 2020-02-07 DIAGNOSIS — N200 Calculus of kidney: Secondary | ICD-10-CM

## 2020-02-07 NOTE — Progress Notes (Signed)
Urology Note:  Patient with upcoming surgery. Needs urine sample. Orders placed.

## 2020-02-07 NOTE — Anesthesia Preprocedure Evaluation (Addendum)
Anesthesia Evaluation  Patient identified by MRN, date of birth, ID band Patient awake    Reviewed: Allergy & Precautions, NPO status , Patient's Chart, lab work & pertinent test results  Airway Mallampati: II  TM Distance: >3 FB Neck ROM: Full    Dental  (+) Teeth Intact, Dental Advisory Given   Pulmonary neg pulmonary ROS,    Pulmonary exam normal breath sounds clear to auscultation       Cardiovascular hypertension, Pt. on medications and Pt. on home beta blockers (-) angina+CHF  (-) Past MI and (-) Cardiac Stents + dysrhythmias Atrial Fibrillation  Rhythm:Irregular Rate:Abnormal  Echo 02/04/2020: 1. Inferobasal hypokinesis EF hard to judge as patient in bigeminal rhythm most of study No starin imaging done . Left ventricular ejection fraction, by estimation, is 50 to 55%. The left ventricle has low normal function. The left ventricle demonstrates regional wall motion abnormalities (see scoring diagram/findings for description). The left ventricular internal cavity size was mildly dilated. There is mild left ventricular hypertrophy. Left ventricular diastolic parameters are indeterminate.  2. Right ventricular systolic function is normal. The right ventricular size is normal. There is mildly elevated pulmonary artery systolic pressure.  3. Left atrial size was moderately dilated.  4. The mitral valve is normal in structure and function. Mild mitral valve regurgitation.  5. Tricuspid valve regurgitation is mild to moderate.  6. The aortic valve is tricuspid. Aortic valve regurgitation is trivial. Mild aortic valve sclerosis is present, with no evidence of aortic valve stenosis.  7. The inferior vena cava is dilated in size with >50% respiratory variability, suggesting right atrial pressure of 8 mmHg.    Neuro/Psych negative neurological ROS  negative psych ROS   GI/Hepatic negative GI ROS, Neg liver ROS,   Endo/Other   diabetes, Type 2, Insulin DependentObesity   Renal/GU Renal InsufficiencyRenal diseaseLEFT RENAL STONE     Musculoskeletal negative musculoskeletal ROS (+)   Abdominal   Peds  Hematology  (+) Blood dyscrasia, anemia ,   Anesthesia Other Findings Day of surgery medications reviewed with the patient.  Reproductive/Obstetrics                           Anesthesia Physical Anesthesia Plan  ASA: III  Anesthesia Plan: General   Post-op Pain Management:    Induction: Intravenous  PONV Risk Score and Plan: 4 or greater and Midazolam, Diphenhydramine, Dexamethasone and Ondansetron  Airway Management Planned: Oral ETT  Additional Equipment:   Intra-op Plan:   Post-operative Plan: Extubation in OR  Informed Consent: I have reviewed the patients History and Physical, chart, labs and discussed the procedure including the risks, benefits and alternatives for the proposed anesthesia with the patient or authorized representative who has indicated his/her understanding and acceptance.     Dental advisory given  Plan Discussed with: CRNA  Anesthesia Plan Comments: (See PAT note 02/06/20, Konrad Felix, PA-C)      Anesthesia Quick Evaluation

## 2020-02-07 NOTE — H&P (View-Only) (Signed)
Urology Note:  Patient with upcoming surgery. Needs urine sample. Orders placed.

## 2020-02-07 NOTE — Progress Notes (Signed)
Anesthesia Chart Review   Case: [id number] Date/Time: 02/14/20 4098   Procedure: NEPHROLITHOTOMY PERCUTANEOUS (Left )   Anesthesia type: General   Pre-op diagnosis: LEFT RENAL STONE   Location: [facility]   Surgeons: Ardis Hughs, MD      DISCUSSION:54 y.o. never smoker with h/o HTN, A-fib (on Eliquis), Non-ischemic cardiomyopathy, DM II (improvement in management since 10/2019 hospital admission, A1C now 8.4, CBG DOS), CKD Stage III (creatinine improved since 10/2019 hospital admission, now 1/74), left renal stone scheduled for above procedure 02/14/20 with Dr. Louis Meckel.    Pt last seen by cardiologist, Dr. [name], 02/04/20.  Per OV note EF now 35-40%, previously 20-25% 10/2019.  She has not had a LHC due to elevated creatinine, RHC 10/2019 with preserved cardiac output.  Volume status ok at this visit, she continues with torsemide 40 mg daily.  Pt with frequent PVCs, considering ablation after above procedure, she has a follow up appointment with Dr. Lovena Le 03/04/20. Dr. [name] aware of planned procedure and states it is ok for her to hold Eliquis for 2 days prior to surgery.    Anticipate pt can proceed with planned procedure barring acute status change.   VS: BP (!) 164/69 (BP Location: Right Arm)   Pulse 98   Temp 36.9 C (Oral)   Resp 18   Ht 5\' 8"  (1.727 m)   Wt 96.9 kg   LMP 01/22/2020 (Exact Date)   SpO2 99%   BMI 32.47 kg/m   PROVIDERS: Ladell Pier, MD is PCP   [name], MD is Cardiologist  LABS: Labs reviewed: Acceptable for surgery. (all labs ordered are listed, but only abnormal results are displayed)  Labs Reviewed  HEMOGLOBIN A1C - Abnormal; Notable for the following components:      Result Value   Hgb A1c MFr Bld 8.4 (*)    All other components within normal limits  BASIC METABOLIC PANEL - Abnormal; Notable for the following components:   Potassium 3.4 (*)    Glucose, Bld 237 (*)    BUN 30 (*)    Creatinine, Ser 1.74 (*)    GFR calc non Af Amer 33 (*)    GFR calc Af Amer 39 (*)    All other components within normal limits  CBC - Abnormal; Notable for the following components:   Hemoglobin 11.2 (*)    HCT 35.2 (*)    All other components within normal limits  GLUCOSE, CAPILLARY - Abnormal; Notable for the following components:   Glucose-Capillary 242 (*)    All other components within normal limits     IMAGES:   EKG: 12/31/19 Rate 79 bpm Sinus rhythm with frequent Premature Ventricular complexes in a pattern of bigeminy Abnormal ECG Since last tracing now in ventricular bigeminy.  CV: Echo 02/04/20 Impression:  1. Inferobasal hypokinesis EF hard to judge as patient in bigeminal rhythm most of study No starin imaging done . Left ventricular ejection fraction, by estimation, is 50 to 55%. The left ventricle has low normal function. The left ventricle demonstrates regional wall motion abnormalities (see scoring diagram/findings for description). The left ventricular internal cavity size was mildly dilated. There is mild left ventricular hypertrophy. Left ventricular diastolic parameters are indeterminate. 2. Right ventricular systolic function is normal. The right ventricular size is normal. There is mildly elevated pulmonary artery systolic pressure. 3. Left atrial size was moderately dilated. 4. The mitral valve is normal in structure and function. Mild mitral valve regurgitation. 5.  Tricuspid valve regurgitation is mild to moderate. 6. The aortic valve is tricuspid. Aortic valve regurgitation is trivial. Mild aortic valve sclerosis is present, with no evidence of aortic valve stenosis. 7. The inferior vena cava is dilated in size with >50% respiratory variability, suggesting right atrial pressure of 8 mmHg. Past Medical History:  Diagnosis Date  . Atrial fibrillation with RVR ([place])   . CHF (congestive heart failure) ([place])   . CKD (chronic kidney disease), stage III    . Diabetes mellitus without complication ([place])   . DOE (dyspnea on exertion)    walking upstairs or up hill resolves in one minute  . Fibroids   . History of kidney stones   . Hypertension   . Iron deficiency anemia   . Non-ischemic cardiomyopathy ([facility])    tachycardia induced  . Obese   . Peripheral edema   . Premature ventricular contractions (PVCs) ([facility])   . Umbilical hernia   . Umbilical hernia     Past Surgical History:  Procedure Laterality Date  . CHOLECYSTECTOMY    . RIGHT HEART CATH N/A 11/09/2019   Procedure: RIGHT HEART CATH;  Surgeon: Jolaine Artist, MD;  Location: [facility];  Service: Cardiovascular;  Laterality: N/A;  . WISDOM TOOTH EXTRACTION      MEDICATIONS: . amLODipine (NORVASC) 5 MG tablet  . apixaban (ELIQUIS) 5 MG TABS tablet  . carvedilol (COREG) 25 MG tablet  . glucose blood test strip  . hydrALAZINE (APRESOLINE) 100 MG tablet  . insulin glargine (LANTUS) 100 UNIT/ML injection  . Insulin Syringe-Needle U-100 (INSULIN SYRINGE .5CC/30GX1/2") 30G X 1/2" 0.5 ML MISC  . isosorbide mononitrate (IMDUR) 60 MG 24 hr tablet  . Lancets (ONETOUCH ULTRASOFT) lancets  . mexiletine (MEXITIL) 150 MG capsule  . Multiple Vitamins-Minerals (WOMENS MULTI PO)  . Nutritional Supplements (NUTRITIONAL SHAKE PO)  . potassium chloride (KLOR-CON) 10 MEQ tablet  . torsemide (DEMADEX) 20 MG tablet   No current facility-administered medications for this encounter.     Maia Plan [facility] Pre-Surgical Testing [phone] 02/07/20  11:38 AM

## 2020-02-08 LAB — URINALYSIS, ROUTINE W REFLEX MICROSCOPIC
Bilirubin Urine: NEGATIVE
Glucose, UA: NEGATIVE mg/dL
Ketones, ur: NEGATIVE mg/dL
Nitrite: NEGATIVE
Protein, ur: 100 mg/dL — AB
RBC / HPF: >50 RBC/hpf — ABNORMAL HIGH (ref 0–5)
Specific Gravity, Urine: 1.011 (ref 1.005–1.030)
WBC, UA: >50 WBC/hpf — ABNORMAL HIGH (ref 0–5)
pH: 8 (ref 5.0–8.0)

## 2020-02-10 ENCOUNTER — Telehealth: Payer: Self-pay | Admitting: Urology

## 2020-02-10 LAB — URINE CULTURE: Culture: >=100000 — AB

## 2020-02-10 MED ORDER — AMOXICILLIN-POT CLAVULANATE 875-125 MG PO TABS: 1.0000 | ORAL_TABLET | Freq: Two times a day (BID) | ORAL | 0 refills | Status: DC

## 2020-02-10 NOTE — Telephone Encounter (Signed)
Urology Phone Note:  Called in Augmentin for preop urine culture. She will pick up tomorrow and start. Confirmed pharmacy.

## 2020-02-11 ENCOUNTER — Other Ambulatory Visit (HOSPITAL_COMMUNITY)
Admission: RE | Admit: 2020-02-11 | Discharge: 2020-02-11 | Disposition: A | Discharge status: Home / Self Care | Payer: HRSA Program | Source: Ambulatory Visit | Attending: Urology | Admitting: Urology

## 2020-02-11 DIAGNOSIS — Z20822 Contact with and (suspected) exposure to covid-19: Secondary | ICD-10-CM | POA: Diagnosis not present

## 2020-02-11 DIAGNOSIS — Z01812 Encounter for preprocedural laboratory examination: Secondary | ICD-10-CM | POA: Diagnosis present

## 2020-02-11 LAB — SARS CORONAVIRUS 2 (TAT 6-24 HRS): SARS Coronavirus 2: NEGATIVE

## 2020-02-11 MED FILL — AMOX-CLAV 875-125 MG TABLET: 875-125 | 7 days supply | Qty: 14 | Fill #0

## 2020-02-13 MED ORDER — GENTAMICIN SULFATE 40 MG/ML IJ SOLN
400.0000 mg | INTRAVENOUS | Status: AC
  Administered 2020-02-14: 400 mg via INTRAVENOUS
  Filled 2020-02-13: qty 10

## 2020-02-13 MED ORDER — SODIUM CHLORIDE 0.9 % IV SOLN
2.0000 g | INTRAVENOUS | Status: AC
  Administered 2020-02-14: 2 g via INTRAVENOUS
  Filled 2020-02-13: qty 2

## 2020-02-14 ENCOUNTER — Inpatient Hospital Stay (HOSPITAL_COMMUNITY)
Admission: RE | Admit: 2020-02-14 | Discharge: 2020-02-15 | DRG: 660 | Disposition: A | Discharge status: Home / Self Care | Payer: Self-pay | Attending: Urology | Admitting: Urology

## 2020-02-14 ENCOUNTER — Ambulatory Visit (HOSPITAL_COMMUNITY): Payer: Self-pay | Admitting: Anesthesiology

## 2020-02-14 ENCOUNTER — Ambulatory Visit (HOSPITAL_COMMUNITY): Payer: Self-pay

## 2020-02-14 ENCOUNTER — Ambulatory Visit (HOSPITAL_COMMUNITY): Payer: Self-pay | Admitting: Physician Assistant

## 2020-02-14 ENCOUNTER — Encounter (HOSPITAL_COMMUNITY)
Admission: RE | Disposition: A | Discharge status: Home / Self Care | Payer: Self-pay | Source: Home / Self Care | Attending: Urology

## 2020-02-14 ENCOUNTER — Encounter (HOSPITAL_COMMUNITY): Payer: Self-pay | Admitting: Urology

## 2020-02-14 DIAGNOSIS — I509 Heart failure, unspecified: Secondary | ICD-10-CM | POA: Diagnosis present

## 2020-02-14 DIAGNOSIS — Z7901 Long term (current) use of anticoagulants: Secondary | ICD-10-CM

## 2020-02-14 DIAGNOSIS — I130 Hypertensive heart and chronic kidney disease with heart failure and stage 1 through stage 4 chronic kidney disease, or unspecified chronic kidney disease: Secondary | ICD-10-CM | POA: Diagnosis present

## 2020-02-14 DIAGNOSIS — I4819 Other persistent atrial fibrillation: Secondary | ICD-10-CM | POA: Diagnosis present

## 2020-02-14 DIAGNOSIS — Z6832 Body mass index (BMI) 32.0-32.9, adult: Secondary | ICD-10-CM

## 2020-02-14 DIAGNOSIS — Z8249 Family history of ischemic heart disease and other diseases of the circulatory system: Secondary | ICD-10-CM

## 2020-02-14 DIAGNOSIS — N184 Chronic kidney disease, stage 4 (severe): Secondary | ICD-10-CM | POA: Diagnosis present

## 2020-02-14 DIAGNOSIS — N200 Calculus of kidney: Principal | ICD-10-CM | POA: Diagnosis present

## 2020-02-14 DIAGNOSIS — Z87442 Personal history of urinary calculi: Secondary | ICD-10-CM

## 2020-02-14 DIAGNOSIS — I480 Paroxysmal atrial fibrillation: Secondary | ICD-10-CM | POA: Diagnosis present

## 2020-02-14 DIAGNOSIS — E669 Obesity, unspecified: Secondary | ICD-10-CM | POA: Diagnosis present

## 2020-02-14 DIAGNOSIS — E1122 Type 2 diabetes mellitus with diabetic chronic kidney disease: Secondary | ICD-10-CM | POA: Diagnosis present

## 2020-02-14 DIAGNOSIS — Z833 Family history of diabetes mellitus: Secondary | ICD-10-CM

## 2020-02-14 HISTORY — PX: NEPHROLITHOTOMY: SHX5134

## 2020-02-14 LAB — TYPE AND SCREEN
ABO/RH(D): O POS
Antibody Screen: NEGATIVE

## 2020-02-14 LAB — GLUCOSE, CAPILLARY
Glucose-Capillary: 163 mg/dL — ABNORMAL HIGH (ref 70–99)
Glucose-Capillary: 230 mg/dL — ABNORMAL HIGH (ref 70–99)
Glucose-Capillary: 208 mg/dL — ABNORMAL HIGH (ref 70–99)
Glucose-Capillary: 226 mg/dL — ABNORMAL HIGH (ref 70–99)
Glucose-Capillary: 181 mg/dL — ABNORMAL HIGH (ref 70–99)
Glucose-Capillary: 277 mg/dL — ABNORMAL HIGH (ref 70–99)

## 2020-02-14 LAB — ABO/RH: ABO/RH(D): O POS

## 2020-02-14 LAB — PREGNANCY, URINE: Preg Test, Ur: NEGATIVE

## 2020-02-14 SURGERY — NEPHROLITHOTOMY PERCUTANEOUS
Anesthesia: General | Laterality: Left

## 2020-02-14 MED ORDER — ISOSORBIDE MONONITRATE ER 60 MG PO TB24
60.0000 mg | ORAL_TABLET | Freq: Every day | ORAL | Status: DC
  Administered 2020-02-15: 10:00:00 60 mg via ORAL
  Filled 2020-02-14: qty 1

## 2020-02-14 MED ORDER — FENTANYL CITRATE (PF) 250 MCG/5ML IJ SOLN
INTRAMUSCULAR | Status: AC
  Filled 2020-02-14: qty 5

## 2020-02-14 MED ORDER — MEXILETINE HCL 150 MG PO CAPS
300.0000 mg | ORAL_CAPSULE | Freq: Two times a day (BID) | ORAL | Status: DC
  Administered 2020-02-15: 10:00:00 300 mg via ORAL
  Filled 2020-02-14 (×2): qty 2

## 2020-02-14 MED ORDER — INSULIN ASPART 100 UNIT/ML ~~LOC~~ SOLN
5.0000 [IU] | Freq: Once | SUBCUTANEOUS | Status: AC
  Administered 2020-02-14: 11:00:00 5 [IU] via SUBCUTANEOUS

## 2020-02-14 MED ORDER — INSULIN ASPART 100 UNIT/ML ~~LOC~~ SOLN
0.0000 [IU] | SUBCUTANEOUS | Status: DC
  Administered 2020-02-14: 17:00:00 4 [IU] via SUBCUTANEOUS
  Administered 2020-02-14: 11 [IU] via SUBCUTANEOUS
  Administered 2020-02-15: 4 [IU] via SUBCUTANEOUS
  Administered 2020-02-15: 05:00:00 3 [IU] via SUBCUTANEOUS

## 2020-02-14 MED ORDER — OXYCODONE HCL 5 MG PO TABS
5.0000 mg | ORAL_TABLET | ORAL | Status: DC | PRN
  Administered 2020-02-14: 15:00:00 5 mg via ORAL
  Filled 2020-02-14: qty 1

## 2020-02-14 MED ORDER — ACETAMINOPHEN 325 MG PO TABS: 650.0000 mg | ORAL_TABLET | ORAL | Status: DC | PRN

## 2020-02-14 MED ORDER — DOCUSATE SODIUM 100 MG PO CAPS
100.0000 mg | ORAL_CAPSULE | Freq: Two times a day (BID) | ORAL | Status: DC
  Administered 2020-02-14 – 2020-02-15 (×2): 100 mg via ORAL
  Filled 2020-02-14 (×2): qty 1

## 2020-02-14 MED ORDER — DEXAMETHASONE SODIUM PHOSPHATE 10 MG/ML IJ SOLN
INTRAMUSCULAR | Status: DC | PRN
  Administered 2020-02-14: 10 mg via INTRAVENOUS

## 2020-02-14 MED ORDER — EPHEDRINE 5 MG/ML INJ
INTRAVENOUS | Status: AC
  Filled 2020-02-14: qty 10

## 2020-02-14 MED ORDER — BUPIVACAINE HCL 0.25 % IJ SOLN
INTRAMUSCULAR | Status: DC | PRN
  Administered 2020-02-14: 30 mL

## 2020-02-14 MED ORDER — AMLODIPINE BESYLATE 5 MG PO TABS
5.0000 mg | ORAL_TABLET | Freq: Every day | ORAL | Status: DC
  Administered 2020-02-15: 10:00:00 5 mg via ORAL
  Filled 2020-02-14: qty 1

## 2020-02-14 MED ORDER — MENTHOL 3 MG MT LOZG: 1.0000 | LOZENGE | OROMUCOSAL | Status: DC | PRN

## 2020-02-14 MED ORDER — SODIUM CHLORIDE 0.9% IV SOLUTION: Freq: Once | INTRAVENOUS | Status: DC

## 2020-02-14 MED ORDER — PROPOFOL 10 MG/ML IV BOLUS: INTRAVENOUS | Status: DC | PRN

## 2020-02-14 MED ORDER — FENTANYL CITRATE (PF) 100 MCG/2ML IJ SOLN
INTRAMUSCULAR | Status: AC
  Filled 2020-02-14: qty 2

## 2020-02-14 MED ORDER — LACTATED RINGERS IV SOLN: INTRAVENOUS | Status: DC

## 2020-02-14 MED ORDER — IOHEXOL 300 MG/ML  SOLN
INTRAMUSCULAR | Status: DC | PRN
  Administered 2020-02-14: 100 mL

## 2020-02-14 MED ORDER — FENTANYL CITRATE (PF) 250 MCG/5ML IJ SOLN: INTRAMUSCULAR | Status: DC | PRN

## 2020-02-14 MED ORDER — CARVEDILOL 25 MG PO TABS
25.0000 mg | ORAL_TABLET | Freq: Two times a day (BID) | ORAL | Status: DC
  Administered 2020-02-15: 08:00:00 25 mg via ORAL
  Filled 2020-02-14: qty 1

## 2020-02-14 MED ORDER — SODIUM CHLORIDE 0.9 % IV SOLN
1.0000 g | Freq: Four times a day (QID) | INTRAVENOUS | Status: DC
  Filled 2020-02-14 (×8): qty 1000

## 2020-02-14 MED ORDER — TORSEMIDE 20 MG PO TABS
40.0000 mg | ORAL_TABLET | Freq: Two times a day (BID) | ORAL | Status: DC
  Administered 2020-02-14 – 2020-02-15 (×2): 40 mg via ORAL
  Filled 2020-02-14 (×3): qty 2

## 2020-02-14 MED ORDER — PROMETHAZINE HCL 25 MG/ML IJ SOLN: 6.2500 mg | INTRAMUSCULAR | Status: DC | PRN

## 2020-02-14 MED ORDER — PHENOL 1.4 % MT LIQD
1.0000 | OROMUCOSAL | Status: DC | PRN
  Filled 2020-02-14: qty 177

## 2020-02-14 MED ORDER — BUPIVACAINE HCL 0.25 % IJ SOLN
INTRAMUSCULAR | Status: AC
  Filled 2020-02-14: qty 1

## 2020-02-14 MED ORDER — MIDAZOLAM HCL 2 MG/2ML IJ SOLN
INTRAMUSCULAR | Status: DC | PRN
  Administered 2020-02-14: 2 mg via INTRAVENOUS

## 2020-02-14 MED ORDER — SENNA 8.6 MG PO TABS
1.0000 | ORAL_TABLET | Freq: Two times a day (BID) | ORAL | Status: DC
  Administered 2020-02-14 – 2020-02-15 (×2): 8.6 mg via ORAL
  Filled 2020-02-14 (×2): qty 1

## 2020-02-14 MED ORDER — SODIUM CHLORIDE 0.9 % IV SOLN
1.0000 g | Freq: Every day | INTRAVENOUS | Status: DC
  Administered 2020-02-14: 16:00:00 1 g via INTRAVENOUS
  Filled 2020-02-14: qty 10
  Filled 2020-02-14: qty 1

## 2020-02-14 MED ORDER — LACTATED RINGERS IV SOLN: INTRAVENOUS | Status: DC | PRN

## 2020-02-14 MED ORDER — INSULIN ASPART 100 UNIT/ML ~~LOC~~ SOLN
5.0000 [IU] | Freq: Once | SUBCUTANEOUS | Status: AC
  Administered 2020-02-14: 13:00:00 5 [IU] via SUBCUTANEOUS

## 2020-02-14 MED ORDER — DEXAMETHASONE SODIUM PHOSPHATE 10 MG/ML IJ SOLN
INTRAMUSCULAR | Status: AC
  Filled 2020-02-14: qty 1

## 2020-02-14 MED ORDER — PROPOFOL 10 MG/ML IV BOLUS
INTRAVENOUS | Status: AC
  Filled 2020-02-14: qty 20

## 2020-02-14 MED ORDER — SODIUM CHLORIDE 0.9 % IR SOLN
Status: DC | PRN
  Administered 2020-02-14: 33000 mL

## 2020-02-14 MED ORDER — ONDANSETRON HCL 4 MG/2ML IJ SOLN
INTRAMUSCULAR | Status: DC | PRN
  Administered 2020-02-14: 4 mg via INTRAVENOUS

## 2020-02-14 MED ORDER — PROPOFOL 10 MG/ML IV BOLUS
INTRAVENOUS | Status: DC | PRN
  Administered 2020-02-14: 90 mg via INTRAVENOUS

## 2020-02-14 MED ORDER — ALUM & MAG HYDROXIDE-SIMETH 200-200-20 MG/5ML PO SUSP: 15.0000 mL | ORAL | Status: DC | PRN

## 2020-02-14 MED ORDER — ROCURONIUM BROMIDE 10 MG/ML (PF) SYRINGE
PREFILLED_SYRINGE | INTRAVENOUS | Status: DC | PRN
  Administered 2020-02-14: 10 mg via INTRAVENOUS
  Administered 2020-02-14: 20 mg via INTRAVENOUS
  Administered 2020-02-14: 10 mg via INTRAVENOUS
  Administered 2020-02-14: 60 mg via INTRAVENOUS

## 2020-02-14 MED ORDER — FENTANYL CITRATE (PF) 250 MCG/5ML IJ SOLN
INTRAMUSCULAR | Status: DC | PRN
  Administered 2020-02-14: 100 ug via INTRAVENOUS
  Administered 2020-02-14 (×2): 50 ug via INTRAVENOUS

## 2020-02-14 MED ORDER — ONDANSETRON HCL 4 MG/2ML IJ SOLN: 4.0000 mg | INTRAMUSCULAR | Status: DC | PRN

## 2020-02-14 MED ORDER — INSULIN ASPART 100 UNIT/ML ~~LOC~~ SOLN
SUBCUTANEOUS | Status: AC
  Filled 2020-02-14: qty 1

## 2020-02-14 MED ORDER — FENTANYL CITRATE (PF) 100 MCG/2ML IJ SOLN
25.0000 ug | INTRAMUSCULAR | Status: DC | PRN
  Administered 2020-02-14 (×3): 50 ug via INTRAVENOUS

## 2020-02-14 MED ORDER — SUGAMMADEX SODIUM 200 MG/2ML IV SOLN
INTRAVENOUS | Status: DC | PRN
  Administered 2020-02-14: 250 mg via INTRAVENOUS

## 2020-02-14 MED ORDER — EPHEDRINE SULFATE-NACL 50-0.9 MG/10ML-% IV SOSY
PREFILLED_SYRINGE | INTRAVENOUS | Status: DC | PRN
  Administered 2020-02-14 (×3): 10 mg via INTRAVENOUS

## 2020-02-14 MED ORDER — MORPHINE SULFATE (PF) 2 MG/ML IV SOLN: 2.0000 mg | INTRAVENOUS | Status: DC | PRN

## 2020-02-14 MED ORDER — LIDOCAINE 2% (20 MG/ML) 5 ML SYRINGE
INTRAMUSCULAR | Status: DC | PRN
  Administered 2020-02-14: 100 mg via INTRAVENOUS

## 2020-02-14 MED ORDER — ONDANSETRON HCL 4 MG/2ML IJ SOLN
INTRAMUSCULAR | Status: AC
  Filled 2020-02-14: qty 2

## 2020-02-14 MED ORDER — SUCCINYLCHOLINE CHLORIDE 200 MG/10ML IV SOSY
PREFILLED_SYRINGE | INTRAVENOUS | Status: AC
  Filled 2020-02-14: qty 10

## 2020-02-14 MED ORDER — ROCURONIUM BROMIDE 10 MG/ML (PF) SYRINGE: PREFILLED_SYRINGE | INTRAVENOUS | Status: DC | PRN

## 2020-02-14 MED ORDER — MIDAZOLAM HCL 2 MG/2ML IJ SOLN
INTRAMUSCULAR | Status: AC
  Filled 2020-02-14: qty 2

## 2020-02-14 MED ORDER — HYDROMORPHONE HCL 1 MG/ML IJ SOLN
0.5000 mg | Freq: Once | INTRAMUSCULAR | Status: AC
  Administered 2020-02-14: 14:00:00 0.5 mg via INTRAVENOUS

## 2020-02-14 MED ORDER — HYDROMORPHONE HCL 1 MG/ML IJ SOLN
INTRAMUSCULAR | Status: AC
  Filled 2020-02-14: qty 1

## 2020-02-14 MED ORDER — LIDOCAINE 2% (20 MG/ML) 5 ML SYRINGE
INTRAMUSCULAR | Status: AC
  Filled 2020-02-14: qty 5

## 2020-02-14 SURGICAL SUPPLY — 52 items
BAG URINE DRAIN 2000ML AR STRL (UROLOGICAL SUPPLIES) ×2 IMPLANT
BASKET STONE NCOMPASS (UROLOGICAL SUPPLIES) IMPLANT
BASKET ZERO TIP NITINOL 2.4FR (BASKET) ×2 IMPLANT
BENZOIN TINCTURE PRP APPL 2/3 (GAUZE/BANDAGES/DRESSINGS) ×2 IMPLANT
BLADE SURG 15 STRL LF DISP TIS (BLADE) ×1 IMPLANT
BLADE SURG 15 STRL SS (BLADE) ×1
CATH AINSWORTH 30CC 24FR (CATHETERS) IMPLANT
CATH IMAGER II 65CM (CATHETERS) ×2 IMPLANT
CATH ULTRATHANE 14FR (CATHETERS) ×2 IMPLANT
CATH URET 5FR 28IN OPEN ENDED (CATHETERS) ×2 IMPLANT
CATH URET DUAL LUMEN 6-10FR 50 (CATHETERS) ×2 IMPLANT
CATH X-FORCE N30 NEPHROSTOMY (TUBING) ×2 IMPLANT
CHLORAPREP W/TINT 26 (MISCELLANEOUS) ×2 IMPLANT
COVER WAND RF STERILE (DRAPES) IMPLANT
DRAPE C-ARM 42X120 X-RAY (DRAPES) ×2 IMPLANT
DRAPE LINGEMAN PERC (DRAPES) ×2 IMPLANT
DRSG PAD ABDOMINAL 8X10 ST (GAUZE/BANDAGES/DRESSINGS) ×2 IMPLANT
DRSG TEGADERM 8X12 (GAUZE/BANDAGES/DRESSINGS) ×4 IMPLANT
EXTRACTOR STONE 1.7FRX115CM (UROLOGICAL SUPPLIES) IMPLANT
FIBER LASER FLEXIVA 365 (UROLOGICAL SUPPLIES) IMPLANT
FIBER LASER TRAC TIP (UROLOGICAL SUPPLIES) IMPLANT
GAUZE SPONGE 4X4 12PLY STRL (GAUZE/BANDAGES/DRESSINGS) ×2 IMPLANT
GLOVE BIOGEL M STRL SZ7.5 (GLOVE) ×2 IMPLANT
GOWN STRL REUS W/TWL XL LVL3 (GOWN DISPOSABLE) ×2 IMPLANT
GUIDEWIRE AMPLAZ .035X145 (WIRE) ×4 IMPLANT
GUIDEWIRE ANG ZIPWIRE 038X150 (WIRE) IMPLANT
GUIDEWIRE STR DUAL SENSOR (WIRE) ×4 IMPLANT
HOLDER NEEDLE AMPLATZ W/INSERT (MISCELLANEOUS) IMPLANT
IV SET EXTENSION CATH 6 NF (IV SETS) ×2 IMPLANT
KIT BASIN OR (CUSTOM PROCEDURE TRAY) ×2 IMPLANT
KIT PROBE 340X3.4XDISP GRN (MISCELLANEOUS) IMPLANT
KIT PROBE TRILOGY 3.4X340 (MISCELLANEOUS)
KIT PROBE TRILOGY 3.9X350 (MISCELLANEOUS) IMPLANT
KIT TURNOVER KIT A (KITS) IMPLANT
MANIFOLD NEPTUNE II (INSTRUMENTS) ×2 IMPLANT
NEEDLE SPNL 20GX3.5 QUINCKE YW (NEEDLE) IMPLANT
NEEDLE TROCAR 18X15 ECHO (NEEDLE) IMPLANT
NEEDLE TROCAR 18X20 (NEEDLE) IMPLANT
NS IRRIG 1000ML POUR BTL (IV SOLUTION) ×2 IMPLANT
PACK CYSTO (CUSTOM PROCEDURE TRAY) ×2 IMPLANT
SHEATH PEELAWAY SET 9 (SHEATH) ×2 IMPLANT
SHEATH X FORCE 10MMX22CM (SHEATH) ×2 IMPLANT
SPONGE LAP 4X18 RFD (DISPOSABLE) ×2 IMPLANT
SUT ETHILON 3 0 PS 1 (SUTURE) ×4 IMPLANT
SYR 10ML LL (SYRINGE) ×2 IMPLANT
SYR 20ML LL LF (SYRINGE) ×4 IMPLANT
SYR 50ML LL SCALE MARK (SYRINGE) ×2 IMPLANT
TOWEL OR 17X26 10 PK STRL BLUE (TOWEL DISPOSABLE) ×2 IMPLANT
TRAY FOLEY MTR SLVR 16FR STAT (SET/KITS/TRAYS/PACK) ×2 IMPLANT
TUBING CONNECTING 10 (TUBING) ×4 IMPLANT
TUBING STONE CATCHER TRILOGY (MISCELLANEOUS) IMPLANT
TUBING UROLOGY SET (TUBING) ×2 IMPLANT

## 2020-02-14 NOTE — Interval H&P Note (Signed)
History and Physical Interval Note:  02/14/2020 7:33 AM  Adrienne Lane  has presented today for surgery, with the diagnosis of LEFT RENAL STONE.  The various methods of treatment have been discussed with the patient and family. After consideration of risks, benefits and other options for treatment, the patient has consented to  Procedure(s): NEPHROLITHOTOMY PERCUTANEOUS (Left) as a surgical intervention.  The patient's history has been reviewed, patient examined, no change in status, stable for surgery.  I have reviewed the patient's chart and labs.  Questions were answered to the patient's satisfaction.     Haskel Schroeder

## 2020-02-14 NOTE — Transfer of Care (Signed)
Immediate Anesthesia Transfer of Care Note  Patient: Adrienne Lane  Procedure(s) Performed: NEPHROLITHOTOMY PERCUTANEOUS/ SURGEON ACCESS/ LEFT PERCUTANEOUS NEPHROSTOMY TUBE PLACEMENT (Left )  Patient Location: PACU  Anesthesia Type:General  Level of Consciousness: drowsy  Airway & Oxygen Therapy: Patient Spontanous Breathing and Patient connected to face mask oxygen  Post-op Assessment: Report given to RN and Post -op Vital signs reviewed and stable  Post vital signs: Reviewed and stable  Last Vitals:  Vitals Value Taken Time  BP 115/73 02/14/20 1106  Temp    Pulse 55 02/14/20 1107  Resp 22 02/14/20 1107  SpO2 96 % 02/14/20 1107  Vitals shown include unvalidated device data.  Last Pain:  Vitals:   02/14/20 0559  TempSrc: Oral         Complications: No apparent anesthesia complications

## 2020-02-14 NOTE — H&P (Signed)
Urology Problem List  - Bilateral Staghorn stones  - Microhematuria   01/10/20  54 year old unhealthy female with obesity, diabetes on insulin, heart failure (Ef 25% in Nov 2020), paroxysmal A fib on Eliquis uterine fibroids, CKD, and bilateral staghorn stones.   Stones were diagnosed in 2019 but did not follow-up with urology. She was hospitalized in November 2020 with heart failure and acute on chronic CKD. During that admission a CT scan was performed which again identified large bilateral renal stones without hydronephrosis. Seen by urology on 10/24/2019 ([facility]). Decision was made to hold on intervention due to no hydronephrosis.   Since discharge she has adressed several of her medical issues   CARDIOLOGY:  - seen in heart failure clinic on 12/03/2019. Dr. [name]. [phone]. [place] [facility] heart failure clinic. Seen again by cardiology on 12/31/2019. Plan was to continue Eliquis and be evaluated by a EP cardiologist for persistent A. fib. Although she has a low EF she denies any history of MI, stent, aspirin, Plavix. Next appointment on 02/05/20   GYN:  - She was seen by gynecology with plan to reevaluate her large uterine fibroid in June 2021. She is on no medications for this.   NEPHROLOGY:  She was seen by nephrology on 12/05/2019. No acute interventions from this visit. Last creatinine 12/31/2019, 1.82.    UROLOGY:  From a urologic perspective she has had no prior episodes of nephrolithiasis. Did have hematuria back when she was living in [place] in 2019 but never had imaging at the time. Currently denies flank pain. No visible hematuria but does have microscopic hematuria on UA today. Denies fevers, chills, suprapubic pain. Due to lack of symptoms will not send urine for culture.   Did discuss that she has microhematuria, likely from stones, and that we would plan on a cystoscopy during her stone procedure   Today we reviewed her imaging and I drew a picture to  help her conceptualize her stone burden. I explained a PCNL in detail and in her specific circumstances stated we would likely need access from 2 points in the kidney and she may need multiple procedures on each side to clear her stones.   She was very knowledgeable of her medical history and participated in the conversation, voicing understanding of the plan and rationale. States she will likely receive disability within 90 days and is actively working on that process.   No prior abdominal surgeries. Does have a ventral wall hernia which is mostly asymptomatic     ALLERGIES: None   MEDICATIONS: Apresoline  Coreg  Dermadex  Eliquis  Imdur  Lantus  Mexitil     GU PSH: None   NON-GU PSH: Remove Gallbladder     GU PMH: None   NON-GU PMH: Atrial Fibrillation Diabetes Type 2 Heart disease, unspecified Hypertension    FAMILY HISTORY: Congestive Heart Failure - Mother Diabetes - Mother, Brother Heart Attack - Mother Hypertension - Brother, Mother stroke - Mother   SOCIAL HISTORY: Marital Status: Married Preferred Language: English; Ethnicity: Not Hispanic Or Latino; Race: Black or African American Current Smoking Status: Patient has never smoked.   Tobacco Use Assessment Completed: Used Tobacco in last 30 days? Has never drank.  Drinks 1 caffeinated drink per day. Patient's occupation Customer service manager.    REVIEW OF SYSTEMS:    GU Review Female:   Patient reports frequent urination, get up at night to urinate, and leakage of urine. Patient denies hard to postpone urination, burning /pain with urination, stream  starts and stops, trouble starting your stream, have to strain to urinate, and being pregnant.  Gastrointestinal (Upper):   Patient denies nausea, vomiting, and indigestion/ heartburn.  Gastrointestinal (Lower):   Patient denies diarrhea and constipation.  Constitutional:   Patient reports fatigue. Patient denies fever, night sweats, and weight loss.  Skin:    Patient denies skin rash/ lesion and itching.  Eyes:   Patient denies blurred vision and double vision.  Ears/ Nose/ Throat:   Patient denies sore throat and sinus problems.  Hematologic/Lymphatic:   Patient denies swollen glands and easy bruising.  Cardiovascular:   Patient denies leg swelling and chest pains.  Respiratory:   Patient denies cough and shortness of breath.  Endocrine:   Patient reports excessive thirst.   Musculoskeletal:   Patient denies back pain and joint pain.  Neurological:   Patient denies headaches and dizziness.  Psychologic:   Patient denies depression and anxiety.   Notes: Hematuria, Hx of UTI    VITAL SIGNS:      01/10/2020 02:42 PM  Weight 230 lb / 104.33 kg  Height 68 in / 172.72 cm  BP 173/76 mmHg  Pulse 39 /min  Temperature 98.0 F / 36.6 C  BMI 35.0 kg/m   MULTI-SYSTEM PHYSICAL EXAMINATION:      Notes: General Appearance: No acute distress. Alert and oriented x 3.  Pulmonary: Normal respiratory effort on room air  Cardiovascular: Regular rate  Abdomen: Soft, non-tender, no protruding hernia  Musculoskeletal: Normal gait. Extremities without edema.  GU: No CVA or SP tenderness  Neurologic: No motor abnormalities noted.      PAST DATA REVIEWED:  Source Of History:  Patient  Records Review:   Previous Patient Records  Urine Test Review:   Urinalysis  X-Ray Review: C.T. Abdomen: Reviewed Films.     PROCEDURES:          Urinalysis w/Scope Dipstick Dipstick Cont'd Micro  Color: Red Bilirubin: Neg mg/dL WBC/hpf: >60/hpf  Appearance: Turbid Ketones: Neg mg/dL RBC/hpf: >60/hpf  Specific Gravity: 1.020 Blood: 3+ ery/uL Bacteria: Rare (0-9/hpf)  pH: 7.0 Protein: 3+ mg/dL Cystals: NS (Not Seen)  Glucose: Neg mg/dL Urobilinogen: 0.2 mg/dL Casts: NS (Not Seen)    Nitrites: Neg Trichomonas: Not Present    Leukocyte Esterase: 3+ leu/uL Mucous: Not Present      Epithelial Cells: 0 - 5/hpf      Yeast: NS (Not Seen)      Sperm: Not Present     Notes: Microscopic not concentrated.    ASSESSMENT:      ICD-10 Details  1 GU:   Renal calculus - N20.0   2   Microscopic hematuria - R31.21    PLAN:           Schedule Return Visit/Planned Activity: 1 Month - Office Visit  Return Notes: Return on 02/07/20 for resident clinic          Document Letter(s):  Created for Patient: Clinical Summary         Notes:   54 year old unhealthy female with CHF/ EF 20%, A. fib on Eliquis, and large bilateral staghorn stones.   We had an in-depth discussion regarding PCNL. She is on board with the general surgical plan. Discussed risks of PCNL which include bleeding, infection, damage to the kidney, residual stone burden.   She needs PCNL soon, but it is not technically an emergency. Due to COVID, pending insurance, and ongoing cardiac workup I think it is best to aim for late February.  Plan:   Microhematuria: likely due to bilateral stones  -Evaluate with cystoscopy during stone procedure   Bilateral stones: Discussed case with Dr. Louis Meckel. Plan for staged PCNL procedure.  - Start with the left side with urology to obtain access from likely 2 sites. Admit overnight and leave council tip nephrostomy tube in place. Likely discharge the next day and bring back for second look PCNL 1 week later. After she has recovered from left side, plan for a PCNL on the right, again getting our own access and proactively scheduling 2 procedures.   Preoperative checklist:  -- Will submit green posting sheet to scheduler. PCNL x2 with 1 week between  - Needs preoperative anesthesia clearance, requested on green sheet  - CBC, BMP, Urine at Visit on 02/07/20  - Cardiac clearance requested on posting sheet. We will see her in clinic after her cardiac visit on 02/05/20  - NO VIR access needed  - Can double check insurance status at time of next visit  Discussed with Dr. Louis Meckel

## 2020-02-14 NOTE — Anesthesia Postprocedure Evaluation (Signed)
Anesthesia Post Note  Patient: Jonica Bickhart  Procedure(s) Performed: NEPHROLITHOTOMY PERCUTANEOUS/ SURGEON ACCESS/ LEFT PERCUTANEOUS NEPHROSTOMY TUBE PLACEMENT (Left )     Patient location during evaluation: PACU Anesthesia Type: General Level of consciousness: awake and alert Vital Signs Assessment: post-procedure vital signs reviewed and stable Respiratory status: spontaneous breathing, nonlabored ventilation, respiratory function stable and patient connected to nasal cannula oxygen Cardiovascular status: blood pressure returned to baseline and stable Postop Assessment: no apparent nausea or vomiting Anesthetic complications: no    Last Vitals:  Vitals:   02/14/20 1315 02/14/20 1330  BP: 105/63   Pulse: (!) 40   Resp: (!) 22 (!) 25  Temp:    SpO2: 94%     Last Pain:  Vitals:   02/14/20 1300  TempSrc:   PainSc: [place]

## 2020-02-14 NOTE — Interval H&P Note (Signed)
History and Physical Interval Note:  02/14/2020 7:38 AM  Adrienne Lane  has presented today for surgery, with the diagnosis of LEFT RENAL STONE.  The various methods of treatment have been discussed with the patient and family. After consideration of risks, benefits and other options for treatment, the patient has consented to  Procedure(s): NEPHROLITHOTOMY PERCUTANEOUS (Left) as a surgical intervention.  The patient's history has been reviewed, patient examined, no change in status, stable for surgery.  I have reviewed the patient's chart and labs.  Questions were answered to the patient's satisfaction.     Ardis Hughs

## 2020-02-14 NOTE — Anesthesia Procedure Notes (Signed)
Procedure Name: Intubation Date/Time: 02/14/2020 8:48 AM Performed by: Sharlette Dense, CRNA Patient Re-evaluated:Patient Re-evaluated prior to induction Oxygen Delivery Method: Circle system utilized Preoxygenation: Pre-oxygenation with 100% oxygen Induction Type: IV induction Ventilation: Mask ventilation without difficulty and Oral airway inserted - appropriate to patient size Laryngoscope Size: Sabra Heck and 2 Grade View: Grade I Tube type: Oral Tube size: 7.5 mm Number of attempts: 1 Airway Equipment and Method: Stylet Placement Confirmation: ETT inserted through vocal cords under direct vision,  positive ETCO2 and breath sounds checked- equal and bilateral Secured at: 24 cm Tube secured with: Tape Dental Injury: Teeth and Oropharynx as per pre-operative assessment

## 2020-02-14 NOTE — Plan of Care (Signed)

## 2020-02-14 NOTE — Op Note (Addendum)
Preoperative Diagnosis:   Left renal stone greater than 2 cm   Postoperative Diagnosis:   Left renal stone greater than 2 cm   Procedure(s) Performed:   1. Left percutaneous nephrostolithotomy for stone burden greater than 2 cm 2. Left percutaneous renal access to establish nephrostomy tract 3. Cystoscopy with Left ureteral catheterization and retrograde pyelogram 4. Antegrade nephrostogram with nephrostomy tube placement 5. Simple Foley catheter placement 6. Intraoperative fluoroscopy with interpretation less than 1 hour   Teaching Surgeon:  Louis Meckel, M.D.   Resident Surgeon:  Bethel Born Snyder,M.D.   Anesthesia:  General via endotracheal tube.     IV Fluids:  See Anesthesia record.   Estimated Blood Loss:  25 cc.   Cultures:  None   Drains:  Left 16 Fr nephrostomy tube, 16 French Foley catheter, both to drainage.   Specimens: None   Complications:  None.   Indications for Surgery:  Adrienne Lane is a 54 y.o. female with a history of nephrolithiasis.  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  The patient presents today for percutaneous treatment of Left kidney stone. The risks and benefits of the procedure were discussed with the patient who wishes to proceed.   Operative Findings:   Successful percutaneous access was obtained to the posterior upper pole calyx and treatment of the half of stone burden.  Successful placement of nephrostomy tube via lower pole access.  Radiologic Interpretation of Retrograde Pyelogram and Anterograde Nephrostogram: Retrograde pyelogram demonstrated multiple filling defects consistent with staghorn calculi.  Antegrade nephrostogram post-treatment demonstrated expected contrast along access tract and no other contrast extravasation   Procedure:  The patient was correctly identified in the preoperative holding area where written informed consent as well as potential risks and complications were  reviewed. The patient was brought back to the operative suite. Once correct information was verified, general anesthesia was induced via endotracheal tube.  The patient was then gently repositioned into the prone split leg position, paying careful attention to pad all pressure points and affixed the patient to the bed at multiple points of contact.  She was then prepped and draped in the usual sterile fashion and given appropriate perioperative procedural antibiotics.  Sequential compression devices were placed for VTE prophylaxis.  A timeout was then performed.   At the beginning of the case, flexible cystoscope was performed per urethra with copious lubrication and normal saline irrigation running.  The urethra and bladder appeared grossly normal.  Turning our attention to the Left ureteral orifice, we gently cannulated the orifice with a sensor wire, which was advanced into the renal pelvis under fluoroscopic guidance.  The cystoscope was removed and a dual lumen catheter was advanced over the wire.    Next, we performed a retrograde pyelogram, which demonstrated findings  as above.  We elected to obtain upper pole access. We then placed a second wire in retrograde fashion and removed the dual lumen catheter, leaving the wires in place. We then advanced a ureteroscope over the second wire and advanced this to the level of the renal pelvis. We removed the wire from the scope.  We obtained access via 18-gauge needle using triangulation technique and fluoroscopic as well as direct visual guidance.  After placement of the access needle, a wire was advanced into the kidney and manipulated down the patient's ureter with the assistance of the Kumpe catheter. Next, using a combination of catheters and dilators, we placed a second safety wire and then developed our  percutaneous tract with the advancement of a 30 French x 20 cm Nephromax balloon dilator.  After this, a 64 French sheath was advanced to the edge of the  distal calyx on fluoroscopy.     We then performed rigid nephroscopy with the lithotrite. Immediately upon entrance into the collecting system, we encountered the stone and we then proceeded to treat the stone with lithotripsy. After reaching the limit of the stone capable of being treated with the rigid scope, we elected to obtain a second access point in the lower pole. After a few attempts, we were able to access the lower pole via the triangulation technique. A wire was advanced with some difficulty around lower pole stones and into the kidney with the assistance of a Kumpe catheter. At this time, given length of the procedure and large residual stone burden, we elected to place a lower pole nephrostomy tube and treat the remaining stone at follow up procedure scheduled for next week. A 16 Fr foley catheter was placed per urethra to drainage.   We passed a 78 French nephrostomy tube over our lower pole wire to the patient's renal pelvis without difficulty. We removed the wire and locked nephrostomy tube into place. Antegrade nephrostogram demonstrated complete filling of the entire renal collecting system with minimal contrast extravasation from the access tract and satisfactory placement of her nephrostomy tube in the renal pelvis. The nephrostomy tube was affixed to the patient's skin using a single suture in an interrupted fashion. From the upper pole access site, we removed the sheath and all instruments and wires, and skin was closed in an interrupted fashion. We then administered 20 cc of 1% Lidocaine solution locally around the nephrostomy site and upper pole access site, and placed the nephrostomy tube to drainage. At this point, the patient was extubated and taken to the recovery area in stable fashion.

## 2020-02-15 LAB — BASIC METABOLIC PANEL
Anion gap: 12 (ref 5–15)
BUN: 56 mg/dL — ABNORMAL HIGH (ref 6–20)
CO2: 22 mmol/L (ref 22–32)
Calcium: 9.3 mg/dL (ref 8.9–10.3)
Chloride: 103 mmol/L (ref 98–111)
Creatinine, Ser: 2.72 mg/dL — ABNORMAL HIGH (ref 0.44–1.00)
GFR calc Af Amer: 23 mL/min — ABNORMAL LOW (ref >60)
GFR calc non Af Amer: 19 mL/min — ABNORMAL LOW (ref >60)
Glucose, Bld: 155 mg/dL — ABNORMAL HIGH (ref 70–99)
Potassium: 4.7 mmol/L (ref 3.5–5.1)
Sodium: 137 mmol/L (ref 135–145)

## 2020-02-15 LAB — GLUCOSE, CAPILLARY
Glucose-Capillary: 183 mg/dL — ABNORMAL HIGH (ref 70–99)
Glucose-Capillary: 148 mg/dL — ABNORMAL HIGH (ref 70–99)
Glucose-Capillary: 116 mg/dL — ABNORMAL HIGH (ref 70–99)

## 2020-02-15 LAB — HEMOGLOBIN AND HEMATOCRIT, BLOOD
HCT: 34.1 % — ABNORMAL LOW (ref 36.0–46.0)
Hemoglobin: 10.8 g/dL — ABNORMAL LOW (ref 12.0–15.0)

## 2020-02-15 MED ORDER — AMOXICILLIN-POT CLAVULANATE 875-125 MG PO TABS: 1.0000 | ORAL_TABLET | Freq: Two times a day (BID) | ORAL | 0 refills | Status: DC

## 2020-02-15 MED ORDER — OXYCODONE HCL 5 MG PO TABS: 5.0000 mg | ORAL_TABLET | ORAL | 0 refills | Status: DC | PRN

## 2020-02-15 MED FILL — AMOX-CLAV 875-125 MG TABLET: 875-125 | 7 days supply | Qty: 14 | Fill #0

## 2020-02-15 NOTE — Discharge Summary (Addendum)
Date of admission: 02/14/2020  Date of discharge: 02/15/2020  Admission diagnosis: Nephrolithiasis  Discharge diagnosis: Nephrolithiasis  History and Physical: For full details, please see admission history and physical. Briefly, Adrienne Lane is a 54 y.o. with bilateral nephrolithiasis.  After discussing management/treatment options, she elected to proceed with left PCNL  Hospital Course: [name] was taken to the operating room on 02/14/2020 and underwent a left PCNL. She tolerated this procedure well and without complications. Postoperatively, she was able to be transferred to a regular hospital room following recovery from anesthesia.  She was able to begin ambulating the night of surgery. She remained hemodynamically stable overnight.  She had excellent urine output and foley catheter was removed. She was transitioned to oral pain medication, tolerated a clear liquid diet, and had met all discharge criteria and was able to be discharged home later on POD#1. She is being discharged with nephrostomy tube to drainage until next procedure.   Laboratory values:  Recent Labs    02/15/20 0417  HGB 10.8*  HCT 34.1*    Disposition: Home  Discharge instruction: 1.  Activity:  You are encouraged to ambulate frequently (about every hour during waking hours) to help prevent blood clots from forming in your legs or lungs.  However, you should not engage in any heavy lifting (> 10-15 lbs), strenuous activity, or straining. 2. Diet: You should advance your diet as instructed by your physician.  It will be normal to have some bloating, nausea, and abdominal discomfort intermittently. 3. Prescriptions:  You will be provided a prescription for pain medication to take as needed.  If your pain is not severe enough to require the prescription pain medication, you may take extra strength Tylenol instead which will have less side effects.  You can also take Tylenol in addition to the prescribed oxycodone.  You should also take a stool softener to avoid straining with bowel movements as the prescription pain medication may constipate you. 4. Nephrostomy tube: The nurse will instruct you on how to care for the nephrostomy tube bag after discharge. You can shower, just let the water run over the bag, no need to scrub or clean bag or tubing. 5. What to call us about: You should call the office [phone]) if you develop fever > 101 or develop persistent vomiting. 6.  You should continue holding your Eliquis until surgery next week as we discussed. 7.  You should continue the antibiotic Augmentin; a refill was sent for you in case you run out before surgery. Keep taking until surgery next week.     Discharge medications:   Allergies as of 02/15/2020      Reactions   Adhesive [tape]    Tears skin, can tolerate paper tape      Medication List    TAKE these medications   amLODipine 5 MG tablet Commonly known as: NORVASC Take 1 tablet (5 mg total) by mouth daily. What changed: when to take this   amoxicillin-clavulanate 875-125 MG tablet Commonly known as: AUGMENTIN Take 1 tablet by mouth every 12 (twelve) hours.   apixaban 5 MG Tabs tablet Commonly known as: ELIQUIS Take 1 tablet (5 mg total) by mouth 2 (two) times daily.   carvedilol 25 MG tablet Commonly known as: COREG Take 1 tablet (25 mg total) by mouth 2 (two) times daily with a meal.   glucose blood test strip Use as instructed   hydrALAZINE 100 MG tablet Commonly known as: APRESOLINE Take 1 tablet (100 mg total) by  mouth every 8 (eight) hours.   insulin glargine 100 UNIT/ML injection Commonly known as: LANTUS Inject 15 Units into the skin every morning.   INSULIN SYRINGE .5CC/30GX1/2" 30G X 1/2" 0.5 ML Misc 12 Units by Does not apply route at bedtime.   isosorbide mononitrate 60 MG 24 hr tablet Commonly known as: IMDUR Take 1 tablet (60 mg total) by mouth daily.   mexiletine 150 MG capsule Commonly known as:  MEXITIL Take 2 capsules (300 mg total) by mouth every 12 (twelve) hours.   NUTRITIONAL SHAKE PO Take by mouth. For potassium   onetouch ultrasoft lancets Check CBG twice a day   oxyCODONE 5 MG immediate release tablet Commonly known as: Roxicodone Take 1 tablet (5 mg total) by mouth every 4 (four) hours as needed.   potassium chloride 10 MEQ tablet Commonly known as: KLOR-CON Take 4 tablets (40 mEq total) by mouth daily.   torsemide 20 MG tablet Commonly known as: DEMADEX Take 2 tablets (40 mg total) by mouth 2 (two) times daily.   WOMENS MULTI PO Take 1 tablet by mouth daily.       Followup:  In one week for next procedure as scheduled.

## 2020-02-15 NOTE — Discharge Instructions (Signed)
1.  Activity:  You are encouraged to ambulate frequently (about every hour during waking hours) to help prevent blood clots from forming in your legs or lungs.  However, you should not engage in any heavy lifting (> 10-15 lbs), strenuous activity, or straining. 2. Diet: You should advance your diet as instructed by your physician.  It will be normal to have some bloating, nausea, and abdominal discomfort intermittently. 3. Prescriptions:  You will be provided a prescription for pain medication to take as needed.  If your pain is not severe enough to require the prescription pain medication, you may take extra strength Tylenol instead which will have less side effects.  You can also take Tylenol in addition to the prescribed oxycodone. You should also take a stool softener to avoid straining with bowel movements as the prescription pain medication may constipate you. 4. Nephrostomy tube: The nurse will instruct you on how to care for the nephrostomy tube bag after discharge. You can shower, just let the water run over the bag, no need to scrub or clean bag or tubing. 5. What to call us about: You should call the office [phone]) if you develop fever > 101 or develop persistent vomiting. 6. 6. You should continue holding your Eliquis until surgery next week as we discussed. 7. 7. You should continue the antibiotic Augmentin; a refill was sent for you in case you run out before surgery. Keep taking until surgery next week.

## 2020-02-18 ENCOUNTER — Other Ambulatory Visit (HOSPITAL_COMMUNITY)
Admission: RE | Admit: 2020-02-18 | Discharge: 2020-02-18 | Disposition: A | Discharge status: Home / Self Care | Payer: HRSA Program | Source: Ambulatory Visit | Attending: Urology | Admitting: Urology

## 2020-02-18 ENCOUNTER — Other Ambulatory Visit: Payer: Self-pay

## 2020-02-18 ENCOUNTER — Encounter (HOSPITAL_COMMUNITY): Payer: Self-pay | Admitting: Urology

## 2020-02-18 ENCOUNTER — Telehealth: Payer: Self-pay

## 2020-02-18 DIAGNOSIS — Z20822 Contact with and (suspected) exposure to covid-19: Secondary | ICD-10-CM | POA: Insufficient documentation

## 2020-02-18 DIAGNOSIS — Z01812 Encounter for preprocedural laboratory examination: Secondary | ICD-10-CM | POA: Diagnosis present

## 2020-02-18 NOTE — Telephone Encounter (Signed)
Transition Care Management Follow-up Telephone Call  Date of discharge and from where: 02/15/2020, [facility]  How have you been since you were released from the hospital? She stated that she has been " groggy and sore."   Any questions or concerns? None at this time.   Items Reviewed:  Did the pt receive and understand the discharge instructions provided?  she said that she has the instructions in a bag. Instructed her to call the clinic with any questions that she might have. She can also direct her questions to Dr Louis Meckel regarding her nephrostomy tube and planned follow up. She said that she had a COVID test prior to her recent procedure and she has remained quarantined since discharge   Medications obtained and verified? She has her medications, no new medications ordered.  She confirmed that she continues to hold the eliquis.  She also confirmed that she takes lantus 15 units daily. No questions about her medications.   Any new allergies since your discharge?   None reported   Do you have support at home?  yes, her husband   Other (ie: DME, Home Health, etc) no home health ordered.   Has nephrostomy tube. She said that her husband has been emptying the drainage bag. No questions about the nephrostomy tube.   Functional Questionnaire:  independent.  Her husband provides needed assist    Follow up appointments reviewed:    PCP Hospital f/u appt confirmed? Yes, 03/10/2020, with [facility] [place] f/u appt confirmed?   Dr Louis Meckel - 02/21/2020; cardiology - 03/04/2020.   Are transportation arrangements needed?   No,she has transportation   If their condition worsens, is the pt aware to call  their PCP or go to the ED?  yes  Was the patient provided with contact information for the PCP's office or ED?   She has the phone numbers for PCP and specialists on her AVS  Was the pt encouraged to call back with questions or concerns? Yes.  She was also instructed to  call Dr Louis Meckel with any questions regarding her nephrostomy tube or upcoming appointment with him  - 02/21/2020

## 2020-02-19 LAB — SARS CORONAVIRUS 2 (TAT 6-24 HRS): SARS Coronavirus 2: NEGATIVE

## 2020-02-21 ENCOUNTER — Inpatient Hospital Stay (HOSPITAL_COMMUNITY): Payer: Self-pay

## 2020-02-21 ENCOUNTER — Inpatient Hospital Stay (HOSPITAL_COMMUNITY)
Admission: RE | Admit: 2020-02-21 | Discharge: 2020-02-22 | DRG: 661 | Disposition: A | Discharge status: Home / Self Care | Payer: Self-pay | Source: Other Acute Inpatient Hospital | Attending: Urology | Admitting: Urology

## 2020-02-21 ENCOUNTER — Ambulatory Visit (HOSPITAL_COMMUNITY): Payer: Self-pay

## 2020-02-21 ENCOUNTER — Ambulatory Visit (HOSPITAL_COMMUNITY): Payer: Self-pay | Admitting: Anesthesiology

## 2020-02-21 ENCOUNTER — Encounter (HOSPITAL_COMMUNITY)
Admission: RE | Disposition: A | Discharge status: Home / Self Care | Payer: Self-pay | Source: Other Acute Inpatient Hospital | Attending: Urology

## 2020-02-21 ENCOUNTER — Encounter (HOSPITAL_COMMUNITY): Payer: Self-pay | Admitting: Urology

## 2020-02-21 ENCOUNTER — Other Ambulatory Visit: Payer: Self-pay

## 2020-02-21 DIAGNOSIS — Z8249 Family history of ischemic heart disease and other diseases of the circulatory system: Secondary | ICD-10-CM

## 2020-02-21 DIAGNOSIS — N132 Hydronephrosis with renal and ureteral calculous obstruction: Principal | ICD-10-CM | POA: Diagnosis present

## 2020-02-21 DIAGNOSIS — I878 Other specified disorders of veins: Secondary | ICD-10-CM

## 2020-02-21 DIAGNOSIS — Z833 Family history of diabetes mellitus: Secondary | ICD-10-CM

## 2020-02-21 DIAGNOSIS — Z452 Encounter for adjustment and management of vascular access device: Secondary | ICD-10-CM

## 2020-02-21 DIAGNOSIS — Z794 Long term (current) use of insulin: Secondary | ICD-10-CM

## 2020-02-21 DIAGNOSIS — Z7901 Long term (current) use of anticoagulants: Secondary | ICD-10-CM

## 2020-02-21 DIAGNOSIS — I480 Paroxysmal atrial fibrillation: Secondary | ICD-10-CM | POA: Diagnosis present

## 2020-02-21 DIAGNOSIS — N200 Calculus of kidney: Secondary | ICD-10-CM

## 2020-02-21 DIAGNOSIS — E119 Type 2 diabetes mellitus without complications: Secondary | ICD-10-CM | POA: Diagnosis present

## 2020-02-21 HISTORY — PX: IR FLUORO GUIDE CV LINE RIGHT: IMG2283

## 2020-02-21 HISTORY — DX: Presence of spectacles and contact lenses: Z97.3

## 2020-02-21 HISTORY — PX: NEPHROLITHOTOMY: SHX5134

## 2020-02-21 HISTORY — PX: IR US GUIDE VASC ACCESS RIGHT: IMG2390

## 2020-02-21 LAB — BASIC METABOLIC PANEL
Anion gap: 9 (ref 5–15)
BUN: 35 mg/dL — ABNORMAL HIGH (ref 6–20)
CO2: 23 mmol/L (ref 22–32)
Calcium: 8.4 mg/dL — ABNORMAL LOW (ref 8.9–10.3)
Chloride: 108 mmol/L (ref 98–111)
Creatinine, Ser: 2.09 mg/dL — ABNORMAL HIGH (ref 0.44–1.00)
GFR calc Af Amer: 31 mL/min — ABNORMAL LOW (ref >60)
GFR calc non Af Amer: 27 mL/min — ABNORMAL LOW (ref >60)
Glucose, Bld: 305 mg/dL — ABNORMAL HIGH (ref 70–99)
Potassium: 4.3 mmol/L (ref 3.5–5.1)
Sodium: 140 mmol/L (ref 135–145)

## 2020-02-21 LAB — CBC
HCT: 29.8 % — ABNORMAL LOW (ref 36.0–46.0)
Hemoglobin: 9.6 g/dL — ABNORMAL LOW (ref 12.0–15.0)
MCH: 29.4 pg (ref 26.0–34.0)
MCHC: 32.2 g/dL (ref 30.0–36.0)
MCV: 91.1 fL (ref 80.0–100.0)
Platelets: 265 10*3/uL (ref 150–400)
RBC: 3.27 MIL/uL — ABNORMAL LOW (ref 3.87–5.11)
RDW: 14.1 % (ref 11.5–15.5)
WBC: 12.2 10*3/uL — ABNORMAL HIGH (ref 4.0–10.5)
nRBC: 0 % (ref 0.0–0.2)

## 2020-02-21 LAB — GLUCOSE, CAPILLARY
Glucose-Capillary: 302 mg/dL — ABNORMAL HIGH (ref 70–99)
Glucose-Capillary: 284 mg/dL — ABNORMAL HIGH (ref 70–99)
Glucose-Capillary: 266 mg/dL — ABNORMAL HIGH (ref 70–99)
Glucose-Capillary: 282 mg/dL — ABNORMAL HIGH (ref 70–99)
Glucose-Capillary: 432 mg/dL — ABNORMAL HIGH (ref 70–99)

## 2020-02-21 LAB — GLUCOSE, RANDOM: Glucose, Bld: 495 mg/dL — ABNORMAL HIGH (ref 70–99)

## 2020-02-21 SURGERY — NEPHROLITHOTOMY PERCUTANEOUS SECOND LOOK
Anesthesia: General | Laterality: Left

## 2020-02-21 MED ORDER — LACTATED RINGERS IV SOLN: INTRAVENOUS | Status: DC

## 2020-02-21 MED ORDER — ENSURE ENLIVE PO LIQD: 237.0000 mL | Freq: Two times a day (BID) | ORAL | Status: DC

## 2020-02-21 MED ORDER — FENTANYL CITRATE (PF) 250 MCG/5ML IJ SOLN
INTRAMUSCULAR | Status: DC | PRN
  Administered 2020-02-21: 50 ug via INTRAVENOUS

## 2020-02-21 MED ORDER — INSULIN ASPART 100 UNIT/ML ~~LOC~~ SOLN
0.0000 [IU] | Freq: Three times a day (TID) | SUBCUTANEOUS | Status: DC
  Administered 2020-02-22 (×2): 5 [IU] via SUBCUTANEOUS

## 2020-02-21 MED ORDER — SUGAMMADEX SODIUM 500 MG/5ML IV SOLN
INTRAVENOUS | Status: AC
  Filled 2020-02-21: qty 5

## 2020-02-21 MED ORDER — SODIUM CHLORIDE 0.9 % IR SOLN
Status: DC | PRN
  Administered 2020-02-21: 30000 mL

## 2020-02-21 MED ORDER — SODIUM CHLORIDE 0.9 % IV SOLN: INTRAVENOUS | Status: DC

## 2020-02-21 MED ORDER — OXYCODONE HCL 5 MG PO TABS
5.0000 mg | ORAL_TABLET | ORAL | Status: DC | PRN
  Administered 2020-02-21 – 2020-02-22 (×3): 5 mg via ORAL
  Filled 2020-02-21 (×4): qty 1

## 2020-02-21 MED ORDER — HYDROMORPHONE HCL 1 MG/ML IJ SOLN: 0.2500 mg | INTRAMUSCULAR | Status: DC | PRN

## 2020-02-21 MED ORDER — BUPIVACAINE-EPINEPHRINE 0.5% -1:200000 IJ SOLN
INTRAMUSCULAR | Status: DC | PRN
  Administered 2020-02-21: 20 mL

## 2020-02-21 MED ORDER — PROPOFOL 10 MG/ML IV BOLUS
INTRAVENOUS | Status: AC
  Filled 2020-02-21: qty 20

## 2020-02-21 MED ORDER — ONDANSETRON HCL 4 MG/2ML IJ SOLN
INTRAMUSCULAR | Status: DC | PRN
  Administered 2020-02-21: 4 mg via INTRAVENOUS

## 2020-02-21 MED ORDER — IOHEXOL 300 MG/ML  SOLN
INTRAMUSCULAR | Status: DC | PRN
  Administered 2020-02-21: 20 mL

## 2020-02-21 MED ORDER — ONDANSETRON HCL 4 MG/2ML IJ SOLN: 4.0000 mg | INTRAMUSCULAR | Status: DC | PRN

## 2020-02-21 MED ORDER — ACETAMINOPHEN 500 MG PO TABS
1000.0000 mg | ORAL_TABLET | Freq: Four times a day (QID) | ORAL | Status: DC
  Administered 2020-02-21 – 2020-02-22 (×3): 1000 mg via ORAL
  Filled 2020-02-21 (×3): qty 2

## 2020-02-21 MED ORDER — DOCUSATE SODIUM 100 MG PO CAPS
100.0000 mg | ORAL_CAPSULE | Freq: Two times a day (BID) | ORAL | Status: DC
  Administered 2020-02-21 – 2020-02-22 (×3): 100 mg via ORAL
  Filled 2020-02-21 (×4): qty 1

## 2020-02-21 MED ORDER — SUGAMMADEX SODIUM 500 MG/5ML IV SOLN
INTRAVENOUS | Status: DC | PRN
  Administered 2020-02-21: 250 mg via INTRAVENOUS

## 2020-02-21 MED ORDER — CHLORHEXIDINE GLUCONATE CLOTH 2 % EX PADS
6.0000 | MEDICATED_PAD | Freq: Every day | CUTANEOUS | Status: DC
  Administered 2020-02-22: 6 via TOPICAL

## 2020-02-21 MED ORDER — LIDOCAINE HCL 1 % IJ SOLN
INTRAMUSCULAR | Status: DC | PRN
  Administered 2020-02-21: 10 mL via INTRADERMAL

## 2020-02-21 MED ORDER — PHENYLEPHRINE 40 MCG/ML (10ML) SYRINGE FOR IV PUSH (FOR BLOOD PRESSURE SUPPORT)
PREFILLED_SYRINGE | INTRAVENOUS | Status: AC
  Filled 2020-02-21: qty 10

## 2020-02-21 MED ORDER — SODIUM CHLORIDE 0.9% FLUSH: 10.0000 mL | INTRAVENOUS | Status: DC | PRN

## 2020-02-21 MED ORDER — OXYCODONE HCL 5 MG/5ML PO SOLN: 5.0000 mg | Freq: Once | ORAL | Status: DC | PRN

## 2020-02-21 MED ORDER — MIDAZOLAM HCL 5 MG/5ML IJ SOLN
INTRAMUSCULAR | Status: DC | PRN
  Administered 2020-02-21: 1 mg via INTRAVENOUS

## 2020-02-21 MED ORDER — DEXAMETHASONE SODIUM PHOSPHATE 10 MG/ML IJ SOLN
INTRAMUSCULAR | Status: AC
  Filled 2020-02-21: qty 1

## 2020-02-21 MED ORDER — INSULIN ASPART 100 UNIT/ML ~~LOC~~ SOLN
0.0000 [IU] | Freq: Every day | SUBCUTANEOUS | Status: DC
  Administered 2020-02-21: 5 [IU] via SUBCUTANEOUS

## 2020-02-21 MED ORDER — LIDOCAINE HCL 1 % IJ SOLN
INTRAMUSCULAR | Status: AC
  Filled 2020-02-21: qty 20

## 2020-02-21 MED ORDER — MUPIROCIN 2 % EX OINT
1.0000 "application " | TOPICAL_OINTMENT | Freq: Two times a day (BID) | CUTANEOUS | Status: DC
  Administered 2020-02-21 – 2020-02-22 (×2): 1 via NASAL
  Filled 2020-02-21: qty 22

## 2020-02-21 MED ORDER — PHENYLEPHRINE HCL-NACL 10-0.9 MG/250ML-% IV SOLN
INTRAVENOUS | Status: DC | PRN
  Administered 2020-02-21: 25 ug/min via INTRAVENOUS

## 2020-02-21 MED ORDER — FENTANYL CITRATE (PF) 250 MCG/5ML IJ SOLN
INTRAMUSCULAR | Status: AC
  Filled 2020-02-21: qty 5

## 2020-02-21 MED ORDER — DEXAMETHASONE SODIUM PHOSPHATE 10 MG/ML IJ SOLN
INTRAMUSCULAR | Status: DC | PRN
  Administered 2020-02-21: 4 mg via INTRAVENOUS

## 2020-02-21 MED ORDER — ONDANSETRON HCL 4 MG/2ML IJ SOLN
INTRAMUSCULAR | Status: AC
  Filled 2020-02-21: qty 2

## 2020-02-21 MED ORDER — LIDOCAINE HCL (CARDIAC) PF 100 MG/5ML IV SOSY
PREFILLED_SYRINGE | INTRAVENOUS | Status: DC | PRN
  Administered 2020-02-21: 60 mg via INTRAVENOUS

## 2020-02-21 MED ORDER — CEFAZOLIN SODIUM-DEXTROSE 2-4 GM/100ML-% IV SOLN
2.0000 g | INTRAVENOUS | Status: AC
  Administered 2020-02-21: 2 g via INTRAVENOUS
  Filled 2020-02-21: qty 100

## 2020-02-21 MED ORDER — EPHEDRINE 5 MG/ML INJ
INTRAVENOUS | Status: AC
  Filled 2020-02-21: qty 10

## 2020-02-21 MED ORDER — PROMETHAZINE HCL 25 MG/ML IJ SOLN: 6.2500 mg | INTRAMUSCULAR | Status: DC | PRN

## 2020-02-21 MED ORDER — GENTAMICIN SULFATE 40 MG/ML IJ SOLN
2.0000 mg/kg | INTRAVENOUS | Status: AC
  Administered 2020-02-21: 160 mg via INTRAVENOUS
  Filled 2020-02-21: qty 4

## 2020-02-21 MED ORDER — PROPOFOL 10 MG/ML IV BOLUS
INTRAVENOUS | Status: DC | PRN
  Administered 2020-02-21: 100 mg via INTRAVENOUS

## 2020-02-21 MED ORDER — ROCURONIUM BROMIDE 10 MG/ML (PF) SYRINGE
PREFILLED_SYRINGE | INTRAVENOUS | Status: AC
  Filled 2020-02-21: qty 10

## 2020-02-21 MED ORDER — MORPHINE SULFATE (PF) 2 MG/ML IV SOLN: 2.0000 mg | INTRAVENOUS | Status: DC | PRN

## 2020-02-21 MED ORDER — BUPIVACAINE-EPINEPHRINE 0.5% -1:200000 IJ SOLN
INTRAMUSCULAR | Status: AC
  Filled 2020-02-21: qty 1

## 2020-02-21 MED ORDER — 0.9 % SODIUM CHLORIDE (POUR BTL) OPTIME
TOPICAL | Status: DC | PRN
  Administered 2020-02-21: 1000 mL

## 2020-02-21 MED ORDER — EPHEDRINE SULFATE 50 MG/ML IJ SOLN
INTRAMUSCULAR | Status: DC | PRN
  Administered 2020-02-21 (×5): 10 mg via INTRAVENOUS

## 2020-02-21 MED ORDER — OXYCODONE HCL 5 MG PO TABS: 5.0000 mg | ORAL_TABLET | Freq: Once | ORAL | Status: DC | PRN

## 2020-02-21 MED ORDER — LIDOCAINE 2% (20 MG/ML) 5 ML SYRINGE
INTRAMUSCULAR | Status: AC
  Filled 2020-02-21: qty 5

## 2020-02-21 MED ORDER — INSULIN ASPART 100 UNIT/ML ~~LOC~~ SOLN
5.0000 [IU] | Freq: Once | SUBCUTANEOUS | Status: AC
  Administered 2020-02-21: 5 [IU] via SUBCUTANEOUS

## 2020-02-21 MED ORDER — AMOXICILLIN-POT CLAVULANATE 875-125 MG PO TABS
1.0000 | ORAL_TABLET | Freq: Two times a day (BID) | ORAL | Status: DC
  Administered 2020-02-21 – 2020-02-22 (×2): 1 via ORAL
  Filled 2020-02-21 (×2): qty 1

## 2020-02-21 MED ORDER — MIDAZOLAM HCL 2 MG/2ML IJ SOLN
INTRAMUSCULAR | Status: AC
  Filled 2020-02-21: qty 2

## 2020-02-21 MED ORDER — ROCURONIUM BROMIDE 100 MG/10ML IV SOLN
INTRAVENOUS | Status: DC | PRN
  Administered 2020-02-21 (×2): 10 mg via INTRAVENOUS
  Administered 2020-02-21: 60 mg via INTRAVENOUS
  Administered 2020-02-21 (×2): 10 mg via INTRAVENOUS

## 2020-02-21 MED ORDER — ACETAMINOPHEN 500 MG PO TABS
1000.0000 mg | ORAL_TABLET | Freq: Once | ORAL | Status: AC
  Administered 2020-02-21: 1000 mg via ORAL
  Filled 2020-02-21: qty 2

## 2020-02-21 MED ORDER — SENNA 8.6 MG PO TABS
1.0000 | ORAL_TABLET | Freq: Two times a day (BID) | ORAL | Status: DC
  Administered 2020-02-21 – 2020-02-22 (×3): 8.6 mg via ORAL
  Filled 2020-02-21 (×3): qty 1

## 2020-02-21 MED ORDER — SODIUM CHLORIDE 0.9% FLUSH: 10.0000 mL | Freq: Two times a day (BID) | INTRAVENOUS | Status: DC

## 2020-02-21 MED ORDER — PHENYLEPHRINE HCL (PRESSORS) 10 MG/ML IV SOLN
INTRAVENOUS | Status: DC | PRN
  Administered 2020-02-21 (×3): 80 ug via INTRAVENOUS

## 2020-02-21 SURGICAL SUPPLY — 55 items
BAG URINE DRAIN 2000ML AR STRL (UROLOGICAL SUPPLIES) ×2 IMPLANT
BASKET STONE NCOMPASS (UROLOGICAL SUPPLIES) IMPLANT
BASKET ZERO TIP NITINOL 2.4FR (BASKET) IMPLANT
BENZOIN TINCTURE PRP APPL 2/3 (GAUZE/BANDAGES/DRESSINGS) ×2 IMPLANT
BLADE SURG 15 STRL LF DISP TIS (BLADE) ×1 IMPLANT
BLADE SURG 15 STRL SS (BLADE) ×1
CATH AINSWORTH 30CC 24FR (CATHETERS) IMPLANT
CATH IMAGER II 65CM (CATHETERS) ×2 IMPLANT
CATH ULTRATHANE 14FR (CATHETERS) ×2 IMPLANT
CATH URET 5FR 28IN OPEN ENDED (CATHETERS) IMPLANT
CATH URET DUAL LUMEN 6-10FR 50 (CATHETERS) ×2 IMPLANT
CATH X-FORCE N30 NEPHROSTOMY (TUBING) ×2 IMPLANT
CHLORAPREP W/TINT 26 (MISCELLANEOUS) ×2 IMPLANT
COVER WAND RF STERILE (DRAPES) IMPLANT
DRAPE C-ARM 42X120 X-RAY (DRAPES) ×2 IMPLANT
DRAPE LINGEMAN PERC (DRAPES) ×2 IMPLANT
DRAPE SURG IRRIG POUCH 19X23 (DRAPES) ×2 IMPLANT
DRSG PAD ABDOMINAL 8X10 ST (GAUZE/BANDAGES/DRESSINGS) IMPLANT
DRSG TEGADERM 8X12 (GAUZE/BANDAGES/DRESSINGS) IMPLANT
EXTRACTOR STONE 1.7FRX115CM (UROLOGICAL SUPPLIES) IMPLANT
FIBER LASER FLEXIVA 365 (UROLOGICAL SUPPLIES) IMPLANT
FIBER LASER TRAC TIP (UROLOGICAL SUPPLIES) IMPLANT
GAUZE SPONGE 4X4 12PLY STRL (GAUZE/BANDAGES/DRESSINGS) IMPLANT
GLOVE BIOGEL M STRL SZ7.5 (GLOVE) ×2 IMPLANT
GOWN STRL REUS W/TWL XL LVL3 (GOWN DISPOSABLE) ×4 IMPLANT
GUIDEWIRE AMPLAZ .035X145 (WIRE) ×4 IMPLANT
GUIDEWIRE ANG ZIPWIRE 038X150 (WIRE) IMPLANT
GUIDEWIRE STR DUAL SENSOR (WIRE) ×4 IMPLANT
HOLDER FOLEY CATH W/STRAP (MISCELLANEOUS) ×2 IMPLANT
HOLDER NEEDLE AMPLATZ W/INSERT (MISCELLANEOUS) IMPLANT
IV SET EXTENSION CATH 6 NF (IV SETS) IMPLANT
KIT BASIN OR (CUSTOM PROCEDURE TRAY) ×2 IMPLANT
KIT PROBE 340X3.4XDISP GRN (MISCELLANEOUS) IMPLANT
KIT PROBE TRILOGY 3.4X340 (MISCELLANEOUS)
KIT PROBE TRILOGY 3.9X350 (MISCELLANEOUS) ×4 IMPLANT
KIT TURNOVER KIT A (KITS) ×2 IMPLANT
MANIFOLD NEPTUNE II (INSTRUMENTS) ×2 IMPLANT
NEEDLE SPNL 20GX3.5 QUINCKE YW (NEEDLE) ×2 IMPLANT
NEEDLE TROCAR 18X15 ECHO (NEEDLE) IMPLANT
NEEDLE TROCAR 18X20 (NEEDLE) ×2 IMPLANT
NS IRRIG 1000ML POUR BTL (IV SOLUTION) IMPLANT
PACK CYSTO (CUSTOM PROCEDURE TRAY) ×2 IMPLANT
SHEATH PEELAWAY SET 9 (SHEATH) ×2 IMPLANT
SPONGE DRAIN TRACH 4X4 STRL 2S (GAUZE/BANDAGES/DRESSINGS) ×2 IMPLANT
SPONGE LAP 4X18 RFD (DISPOSABLE) ×2 IMPLANT
SUT ETHILON 3 0 PS 1 (SUTURE) ×2 IMPLANT
SYR 10ML LL (SYRINGE) ×2 IMPLANT
SYR 20ML LL LF (SYRINGE) ×4 IMPLANT
SYR 50ML LL SCALE MARK (SYRINGE) IMPLANT
TOWEL OR 17X26 10 PK STRL BLUE (TOWEL DISPOSABLE) ×2 IMPLANT
TRAY FOLEY MTR SLVR 14FR STAT (SET/KITS/TRAYS/PACK) ×2 IMPLANT
TRAY FOLEY MTR SLVR 16FR STAT (SET/KITS/TRAYS/PACK) IMPLANT
TUBING CONNECTING 10 (TUBING) ×2 IMPLANT
TUBING STONE CATCHER TRILOGY (MISCELLANEOUS) ×2 IMPLANT
TUBING UROLOGY SET (TUBING) ×2 IMPLANT

## 2020-02-21 NOTE — Progress Notes (Signed)
Spoke with Dr Louis Meckel re midline vs PICC line for IV access.  He is to consult with nephrology for approval.  Aware that the pt currently has no IV access.

## 2020-02-21 NOTE — Anesthesia Postprocedure Evaluation (Signed)
Anesthesia Post Note  Patient: Adrienne Lane  Procedure(s) Performed: NEPHROLITHOTOMY PERCUTANEOUS SECOND LOOK (Left )     Patient location during evaluation: PACU Anesthesia Type: General Level of consciousness: awake and alert Pain management: pain level controlled Vital Signs Assessment: post-procedure vital signs reviewed and stable Respiratory status: spontaneous breathing, nonlabored ventilation and respiratory function stable Cardiovascular status: blood pressure returned to baseline and stable Postop Assessment: no apparent nausea or vomiting Anesthetic complications: no    Last Vitals:  Vitals:   02/21/20 1200 02/21/20 1226  BP: 100/60 (!) 115/59  Pulse: 65 64  Resp: 18 20  Temp: (!) 36.4 C (!) 36.3 C  SpO2: 94% 92%    Last Pain:  Vitals:   02/21/20 1226  TempSrc: Oral  PainSc:                  Lynda Rainwater

## 2020-02-21 NOTE — Anesthesia Preprocedure Evaluation (Signed)
Anesthesia Evaluation  Patient identified by MRN, date of birth, ID band Patient awake    Reviewed: Allergy & Precautions, NPO status , Patient's Chart, lab work & pertinent test results  Airway Mallampati: II  TM Distance: >3 FB Neck ROM: Full    Dental  (+) Teeth Intact, Dental Advisory Given   Pulmonary neg pulmonary ROS,    Pulmonary exam normal breath sounds clear to auscultation       Cardiovascular hypertension, Pt. on medications and Pt. on home beta blockers (-) angina+CHF  (-) Past MI and (-) Cardiac Stents + dysrhythmias Atrial Fibrillation  Rhythm:Irregular Rate:Abnormal  Echo 02/04/2020: 1. Inferobasal hypokinesis EF hard to judge as patient in bigeminal rhythm most of study No starin imaging done . Left ventricular ejection fraction, by estimation, is 50 to 55%. The left ventricle has low normal function. The left ventricle demonstrates regional wall motion abnormalities (see scoring diagram/findings for description). The left ventricular internal cavity size was mildly dilated. There is mild left ventricular hypertrophy. Left ventricular diastolic parameters are indeterminate.  2. Right ventricular systolic function is normal. The right ventricular size is normal. There is mildly elevated pulmonary artery systolic pressure.  3. Left atrial size was moderately dilated.  4. The mitral valve is normal in structure and function. Mild mitral valve regurgitation.  5. Tricuspid valve regurgitation is mild to moderate.  6. The aortic valve is tricuspid. Aortic valve regurgitation is trivial. Mild aortic valve sclerosis is present, with no evidence of aortic valve stenosis.  7. The inferior vena cava is dilated in size with >50% respiratory variability, suggesting right atrial pressure of 8 mmHg.    Neuro/Psych negative neurological ROS  negative psych ROS   GI/Hepatic negative GI ROS, Neg liver ROS,   Endo/Other   diabetes, Type 2, Insulin DependentObesity   Renal/GU Renal InsufficiencyRenal diseaseLEFT RENAL STONE     Musculoskeletal negative musculoskeletal ROS (+)   Abdominal   Peds  Hematology  (+) Blood dyscrasia, anemia ,   Anesthesia Other Findings Day of surgery medications reviewed with the patient.  Reproductive/Obstetrics                             Anesthesia Physical  Anesthesia Plan  ASA: III  Anesthesia Plan: General   Post-op Pain Management:    Induction: Intravenous  PONV Risk Score and Plan: 3 and Midazolam, Dexamethasone, Ondansetron and Treatment may vary due to age or medical condition  Airway Management Planned: Oral ETT  Additional Equipment:   Intra-op Plan:   Post-operative Plan: Extubation in OR  Informed Consent: I have reviewed the patients History and Physical, chart, labs and discussed the procedure including the risks, benefits and alternatives for the proposed anesthesia with the patient or authorized representative who has indicated his/her understanding and acceptance.     Dental advisory given  Plan Discussed with: CRNA  Anesthesia Plan Comments: (See PAT note 02/06/20, Konrad Felix, PA-C)        Anesthesia Quick Evaluation

## 2020-02-21 NOTE — Transfer of Care (Signed)
Immediate Anesthesia Transfer of Care Note  Patient: Adrienne Lane  Procedure(s) Performed: NEPHROLITHOTOMY PERCUTANEOUS SECOND LOOK (Left )  Patient Location: PACU  Anesthesia Type:General  Level of Consciousness: drowsy, patient cooperative and responds to stimulation  Airway & Oxygen Therapy: Patient Spontanous Breathing and Patient connected to face mask oxygen  Post-op Assessment: Report given to RN and Post -op Vital signs reviewed and stable  Post vital signs: Reviewed and stable  Last Vitals:  Vitals Value Taken Time  BP 115/62 02/21/20 1049  Temp    Pulse 61 02/21/20 1052  Resp 18 02/21/20 1052  SpO2 100 % 02/21/20 1052  Vitals shown include unvalidated device data.  Last Pain:  Vitals:   02/21/20 0630  TempSrc:   PainSc: 7          Complications: No apparent anesthesia complications

## 2020-02-21 NOTE — H&P (Signed)
Urology Problem List  - Bilateral Staghorn stones  - Microhematuria   Interval history since clinic visit:  S/p initial left PCNL on 02/14/2020.  Presents today for second look left PCNL.  Clinic visit: 01/10/20  54 year old unhealthy female with obesity, diabetes on insulin, heart failure (Ef 25% in Nov 2020), paroxysmal A fib on Eliquis uterine fibroids, CKD, and bilateral staghorn stones.   Stones were diagnosed in 2019 but did not follow-up with urology. She was hospitalized in November 2020 with heart failure and acute on chronic CKD. During that admission a CT scan was performed which again identified large bilateral renal stones without hydronephrosis. Seen by urology on 10/24/2019 ([facility]). Decision was made to hold on intervention due to no hydronephrosis.   Since discharge she has adressed several of her medical issues   CARDIOLOGY:  - seen in heart failure clinic on 12/03/2019. Dr. [name]. [phone]. [facility] heart failure [facility]. Seen again by cardiology on 12/31/2019. Plan was to continue Eliquis and be evaluated by a EP cardiologist for persistent A. fib. Although she has a low EF she denies any history of MI, stent, aspirin, Plavix. Next appointment on 02/05/20   GYN:  - She was seen by gynecology with plan to reevaluate her large uterine fibroid in June 2021. She is on no medications for this.   NEPHROLOGY:  She was seen by nephrology on 12/05/2019. No acute interventions from this visit. Last creatinine 12/31/2019, 1.82.    UROLOGY:  From a urologic perspective she has had no prior episodes of nephrolithiasis. Did have hematuria back when she was living in [place] in 2019 but never had imaging at the time. Currently denies flank pain. No visible hematuria but does have microscopic hematuria on UA today. Denies fevers, chills, suprapubic pain. Due to lack of symptoms will not send urine for culture.   Did discuss that she has microhematuria,  likely from stones, and that we would plan on a cystoscopy during her stone procedure   Today we reviewed her imaging and I drew a picture to help her conceptualize her stone burden. I explained a PCNL in detail and in her specific circumstances stated we would likely need access from 2 points in the kidney and she may need multiple procedures on each side to clear her stones.   She was very knowledgeable of her medical history and participated in the conversation, voicing understanding of the plan and rationale. States she will likely receive disability within 90 days and is actively working on that process.   No prior abdominal surgeries. Does have a ventral wall hernia which is mostly asymptomatic     ALLERGIES: None   MEDICATIONS: Apresoline  Coreg  Dermadex  Eliquis  Imdur  Lantus  Mexitil     GU PSH: None   NON-GU PSH: Remove Gallbladder     GU PMH: None   NON-GU PMH: Atrial Fibrillation Diabetes Type 2 Heart disease, unspecified Hypertension    FAMILY HISTORY: Congestive Heart Failure - Mother Diabetes - Mother, Brother Heart Attack - Mother Hypertension - Brother, Mother stroke - Mother   SOCIAL HISTORY: Marital Status: Married Preferred Language: English; Ethnicity: Not Hispanic Or Latino; Race: Black or African American Current Smoking Status: Patient has never smoked.   Tobacco Use Assessment Completed: Used Tobacco in last 30 days? Has never drank.  Drinks 1 caffeinated drink per day. Patient's occupation Customer service manager.    REVIEW OF SYSTEMS:    GU Review Female:   Patient reports  frequent urination, get up at night to urinate, and leakage of urine. Patient denies hard to postpone urination, burning /pain with urination, stream starts and stops, trouble starting your stream, have to strain to urinate, and being pregnant.  Gastrointestinal (Upper):   Patient denies nausea, vomiting, and indigestion/ heartburn.  Gastrointestinal  (Lower):   Patient denies diarrhea and constipation.  Constitutional:   Patient reports fatigue. Patient denies fever, night sweats, and weight loss.  Skin:   Patient denies skin rash/ lesion and itching.  Eyes:   Patient denies blurred vision and double vision.  Ears/ Nose/ Throat:   Patient denies sore throat and sinus problems.  Hematologic/Lymphatic:   Patient denies swollen glands and easy bruising.  Cardiovascular:   Patient denies leg swelling and chest pains.  Respiratory:   Patient denies cough and shortness of breath.  Endocrine:   Patient reports excessive thirst.   Musculoskeletal:   Patient denies back pain and joint pain.  Neurological:   Patient denies headaches and dizziness.  Psychologic:   Patient denies depression and anxiety.   Notes: Hematuria, Hx of UTI    VITAL SIGNS:      01/10/2020 02:42 PM  Weight 230 lb / 104.33 kg  Height 68 in / 172.72 cm  BP 173/76 mmHg  Pulse 39 /min  Temperature 98.0 F / 36.6 C  BMI 35.0 kg/m   MULTI-SYSTEM PHYSICAL EXAMINATION:      Notes: General Appearance: No acute distress. Alert and oriented x 3.  Pulmonary: Normal respiratory effort on room air  Cardiovascular: Regular rate  Abdomen: Soft, non-tender, no protruding hernia  Musculoskeletal: Normal gait. Extremities without edema.  GU: No CVA or SP tenderness  Neurologic: No motor abnormalities noted.      PAST DATA REVIEWED:  Source Of History:  Patient  Records Review:   Previous Patient Records  Urine Test Review:   Urinalysis  X-Ray Review: C.T. Abdomen: Reviewed Films.     PROCEDURES:          Urinalysis w/Scope Dipstick Dipstick Cont'd Micro  Color: Red Bilirubin: Neg mg/dL WBC/hpf: >60/hpf  Appearance: Turbid Ketones: Neg mg/dL RBC/hpf: >60/hpf  Specific Gravity: 1.020 Blood: 3+ ery/uL Bacteria: Rare (0-9/hpf)  pH: 7.0 Protein: 3+ mg/dL Cystals: NS (Not Seen)  Glucose: Neg mg/dL Urobilinogen: 0.2 mg/dL Casts: NS (Not Seen)    Nitrites: Neg  Trichomonas: Not Present    Leukocyte Esterase: 3+ leu/uL Mucous: Not Present      Epithelial Cells: 0 - 5/hpf      Yeast: NS (Not Seen)      Sperm: Not Present    Notes: Microscopic not concentrated.    ASSESSMENT:      ICD-10 Details  1 GU:   Renal calculus - N20.0   2   Microscopic hematuria - R31.21    PLAN:           Schedule Return Visit/Planned Activity: 1 Month - Office Visit  Return Notes: Return on 02/07/20 for resident clinic          Document Letter(s):  Created for Patient: Clinical Summary         Notes:   54 year old unhealthy female with CHF/ EF 20%, A. fib on Eliquis, and large bilateral staghorn stones.   We had an in-depth discussion regarding PCNL. She is on board with the general surgical plan. Discussed risks of PCNL which include bleeding, infection, damage to the kidney, residual stone burden.   She needs PCNL soon, but it is not technically  an emergency. Due to COVID, pending insurance, and ongoing cardiac workup I think it is best to aim for late February.   Plan:   Microhematuria: likely due to bilateral stones  -Evaluate with cystoscopy during stone procedure   Bilateral stones: Discussed case with Dr. Louis Meckel. Plan for staged PCNL procedure.  - Start with the left side with urology to obtain access from likely 2 sites. Admit overnight and leave council tip nephrostomy tube in place. Likely discharge the next day and bring back for second look PCNL 1 week later. After she has recovered from left side, plan for a PCNL on the right, again getting our own access and proactively scheduling 2 procedures.   Preoperative checklist:  -- Will submit green posting sheet to scheduler. PCNL x2 with 1 week between  - Needs preoperative anesthesia clearance, requested on green sheet  - CBC, BMP, Urine at Visit on 02/07/20  - Cardiac clearance requested on posting sheet. We will see her in clinic after her cardiac visit on 02/05/20  - NO VIR  access needed  - Can double check insurance status at time of next visit

## 2020-02-21 NOTE — Procedures (Signed)
Interventional Radiology Procedure:   Indications: Poor venous access  Procedure: Central line placement  Findings: Right jugular dual lumen central line, tip at SVC/RA junction  Complications: None     EBL: Minimal, less than 10 ml  Plan: Central line is ready to use.   Adrienne Lane R. Anselm Pancoast, MD  Pager: [phone]

## 2020-02-21 NOTE — Op Note (Signed)
Preoperative Diagnosis:   Left renal stone greater than 2 cm   Postoperative Diagnosis:   Left renal stone greater than 2 cm   Procedure(s) Performed:   1. Left percutaneous nephrostolithotomy for stone burden greater than 2 cm 2. Left percutaneous renal access to establish nephrostomy tract 3. Antegrade nephrostogram with nephrostomy tube placement 6. Intraoperative fluoroscopy with interpretation less than 1 hour   Teaching Surgeon:  Louis Meckel, M.D.   Resident Surgeon:  Bethel Born Gilberta Peeters,M.D.   Anesthesia:  General via endotracheal tube.     IV Fluids:  See Anesthesia record.   Estimated Blood Loss:  25 cc.   Cultures:  None   Drains:  Left 14 Fr nephrostomy tube, 14 French Foley catheter, both to drainage.   Specimens: None   Complications:  None.   Indications for Surgery:  Adrienne Lane is a 54 y.o. female with a history of nephrolithiasis.  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  She had initial left PCNL on 02/14/2020 with known residual burden after procedure. The patient presents today for repeat  percutaneous treatment of Left kidney stone. The risks and benefits of the procedure were discussed with the patient who wishes to proceed.   Operative Findings:    Left nephrostomy tube found to be dislodged; therefore new access was obtained.  Successful percutaneous access was obtained to the posterior lower pole calyx with treatment of lower pole stone burden.  Successful placement of nephrostomy tube via a new upper pole access point for planned third look PCNL in the future.  Radiologic Interpretation of Retrograde Pyelogram and Anterograde Nephrostogram: Antegrade nephrostogram demonstrated multiple filling defects consistent with staghorn calculi and moderate hydronephrosis.    Procedure: The patient was correctly identified in the preoperative holding area where written informed consent as well as potential risks and  complications were reviewed. The patient was brought back to the operative suite. Once correct information was verified, general anesthesia was induced via endotracheal tube.  The patient was then gently repositioned into the prone position, paying careful attention to pad all pressure points and affix the patient to the bed at multiple points of contact.  She was then prepped and draped in the usual sterile fashion and given appropriate perioperative procedural antibiotics.  Sequential compression devices were placed for VTE prophylaxis. A 14 Fr foley catheter was placed per urethra.  A timeout was then performed.   We started by performing antegrade nephrostogram through prior nephrostomy tube. This demonstrated nephrostomy tube outside of kidney with contrast extravasation in tract. Therefore, we elected to remove the nephrostomy tube and place new access.  We elected to obtain lower pole access.  We obtained access via 18-gauge needle using triangulation technique and fluoroscopic as well as direct visual guidance.  After placement of the access needle, a wire was advanced into the kidney and manipulated down the patient's ureter with the assistance of the Kumpe catheter. We then utilized a combination of the dual lumen catheter and a Kumpe catheter to place a second wire down to the level of the bladder. We replaced one of the sensor wires with a super stiff wire. Next, we developed our percutaneous tract with the advancement of a 30 French x 20 cm Nephromax balloon dilator over the stiff wire.  After this, a 46 French sheath was advanced to the edge of the distal calyx on fluoroscopy.     We then performed rigid nephroscopy with the lithotrite. Immediately upon entrance into the  collecting system, we encountered stone and we then proceeded to treat the stone with lithotripsy. After reaching the limit of the stone capable of being treated with the rigid scope. We then switched to flexible scope and  identified additional stone burden remaining in upper pole. At this time, given length of the procedure and large residual stone burden, we elected to place an upper pole nephrostomy tube and treat the remaining stone at a third follow up procedure in the future. .   We gained access to the upper pole utilizing the needle and triangulation technique described above. We passed a 34 French nephrostomy tube over the wire to the patient's renal pelvis without difficulty. We confirmed with nephroscopy the placement of the nephrostomy tube curl within the renal pelvis. We removed the wire and locked nephrostomy tube into place. The nephrostomy tube was affixed to the patient's skin using a single suture in an interrupted fashion. From the prior lower pole access site, we removed the sheath and all instruments and wires, and skin was closed in an interrupted fashion. We administered 20 cc of 1% Lidocaine solution with epinephrine locally around the nephrostomy site and lower pole access site, and placed the nephrostomy tube to drainage. At this point, the patient was extubated and taken to the recovery area in stable fashion.

## 2020-02-21 NOTE — Interval H&P Note (Signed)
History and Physical Interval Note:  02/21/2020 7:13 AM  Adrienne Lane  has presented today for surgery, with the diagnosis of LEFT RENAL STONE.  The various methods of treatment have been discussed with the patient and family. After consideration of risks, benefits and other options for treatment, the patient has consented to  Procedure(s): NEPHROLITHOTOMY PERCUTANEOUS SECOND LOOK (Left) as a surgical intervention.  The patient's history has been reviewed, patient examined, no change in status, stable for surgery.  I have reviewed the patient's chart and labs.  Questions were answered to the patient's satisfaction.     Ardis Hughs

## 2020-02-21 NOTE — Anesthesia Procedure Notes (Signed)
Procedure Name: Intubation Date/Time: 02/21/2020 7:40 AM Performed by: Glory Buff, CRNA Pre-anesthesia Checklist: Patient identified, Emergency Drugs available, Suction available and Patient being monitored Patient Re-evaluated:Patient Re-evaluated prior to induction Oxygen Delivery Method: Circle system utilized Preoxygenation: Pre-oxygenation with 100% oxygen Induction Type: IV induction Ventilation: Mask ventilation without difficulty Laryngoscope Size: Miller and 3 Grade View: Grade I Tube type: Oral Tube size: 7.0 mm Number of attempts: 1 Airway Equipment and Method: Stylet and Oral airway Placement Confirmation: ETT inserted through vocal cords under direct vision,  positive ETCO2 and breath sounds checked- equal and bilateral Secured at: 21 cm Tube secured with: Tape Dental Injury: Teeth and Oropharynx as per pre-operative assessment

## 2020-02-21 NOTE — Progress Notes (Signed)
Inpatient Diabetes Program Recommendations  AACE/ADA: New Consensus Statement on Inpatient Glycemic Control (2015)  Target Ranges:  Prepandial:   less than 140 mg/dL      Peak postprandial:   less than 180 mg/dL (1-2 hours)      Critically ill patients:  140 - 180 mg/dL   Lab Results  Component Value Date   GLUCAP 266 (H) 02/21/2020   HGBA1C 8.4 (H) 02/06/2020    Review of Glycemic Control  Diabetes history: DM2 Outpatient Diabetes medications: Lantus 15 units QHS Current orders for Inpatient glycemic control: Novolog 0-15 units tidwc and hs  HgbA1C - 8.4% Received Decadron 4 mg this am. Needs portion of basal insulin home dose  Inpatient Diabetes Program Recommendations:     Lantus 8 units QHS  Continue to follow.  Thank you. Lorenda Peck, RD, LDN, CDE Inpatient Diabetes Coordinator [phone]

## 2020-02-22 ENCOUNTER — Inpatient Hospital Stay (HOSPITAL_COMMUNITY): Payer: Self-pay

## 2020-02-22 LAB — BASIC METABOLIC PANEL
Anion gap: 10 (ref 5–15)
BUN: 40 mg/dL — ABNORMAL HIGH (ref 6–20)
CO2: 23 mmol/L (ref 22–32)
Calcium: 8.6 mg/dL — ABNORMAL LOW (ref 8.9–10.3)
Chloride: 100 mmol/L (ref 98–111)
Creatinine, Ser: 2.45 mg/dL — ABNORMAL HIGH (ref 0.44–1.00)
GFR calc Af Amer: 26 mL/min — ABNORMAL LOW (ref >60)
GFR calc non Af Amer: 22 mL/min — ABNORMAL LOW (ref >60)
Glucose, Bld: 306 mg/dL — ABNORMAL HIGH (ref 70–99)
Potassium: 4.7 mmol/L (ref 3.5–5.1)
Sodium: 133 mmol/L — ABNORMAL LOW (ref 135–145)

## 2020-02-22 LAB — CBC
HCT: 25.9 % — ABNORMAL LOW (ref 36.0–46.0)
Hemoglobin: 8.2 g/dL — ABNORMAL LOW (ref 12.0–15.0)
MCH: 28.8 pg (ref 26.0–34.0)
MCHC: 31.7 g/dL (ref 30.0–36.0)
MCV: 90.9 fL (ref 80.0–100.0)
Platelets: 250 10*3/uL (ref 150–400)
RBC: 2.85 MIL/uL — ABNORMAL LOW (ref 3.87–5.11)
RDW: 13.9 % (ref 11.5–15.5)
WBC: 12.2 10*3/uL — ABNORMAL HIGH (ref 4.0–10.5)
nRBC: 0 % (ref 0.0–0.2)

## 2020-02-22 LAB — GLUCOSE, CAPILLARY
Glucose-Capillary: 327 mg/dL — ABNORMAL HIGH (ref 70–99)
Glucose-Capillary: 236 mg/dL — ABNORMAL HIGH (ref 70–99)
Glucose-Capillary: 241 mg/dL — ABNORMAL HIGH (ref 70–99)

## 2020-02-22 LAB — CALCULI, WITH PHOTOGRAPH (CLINICAL LAB)
Carbonate Apatite: 20 %
Mg NH4 PO4 (Struvite): 80 %
Weight Calculi: 720 mg

## 2020-02-22 MED ORDER — OXYCODONE HCL 5 MG PO TABS: 5.0000 mg | ORAL_TABLET | ORAL | 0 refills | Status: DC | PRN

## 2020-02-22 MED ORDER — FERROUS SULFATE 325 (65 FE) MG PO TABS: 325.0000 mg | ORAL_TABLET | Freq: Every day | ORAL | 3 refills | Status: DC

## 2020-02-22 MED ORDER — AMOXICILLIN-POT CLAVULANATE 875-125 MG PO TABS: 1.0000 | ORAL_TABLET | Freq: Two times a day (BID) | ORAL | 0 refills | Status: AC

## 2020-02-22 MED FILL — AMOX-CLAV 875-125 MG TABLET: 875-125 | 21 days supply | Qty: 42 | Fill #0

## 2020-02-22 MED FILL — FERROUS SULFATE 325 MG TAB: 325 (65 FE) | 30 days supply | Qty: 30 | Fill #0

## 2020-02-22 NOTE — Plan of Care (Signed)
  Problem: Skin Integrity: Goal: Demonstration of wound healing without infection will improve Outcome: Progressing   Problem: Education: Goal: Knowledge of General Education information will improve Description: Including pain rating scale, medication(s)/side effects and non-pharmacologic comfort measures Outcome: Completed/Met   Problem: Clinical Measurements: Goal: Ability to maintain clinical measurements within normal limits will improve Outcome: Completed/Met   Problem: Activity: Goal: Risk for activity intolerance will decrease Outcome: Completed/Met   Problem: Nutrition: Goal: Adequate nutrition will be maintained Outcome: Completed/Met   Problem: Coping: Goal: Level of anxiety will decrease Outcome: Completed/Met

## 2020-02-22 NOTE — Progress Notes (Signed)
1 Day Post-Op Subjective: The patient is doing well.  No nausea or vomiting. Pain is adequately controlled.  Objective: Vital signs in last 24 hours: Temp:  [97.4 F (36.3 C)-98.6 F (37 C)] 98.6 F (37 C) (03/05 0604) Pulse Rate:  [58-72] 72 (03/05 0604) Resp:  [11-22] 18 (03/05 0604) BP: (95-135)/(56-90) 132/64 (03/05 0604) SpO2:  [92 %-100 %] 100 % (03/05 0604)  Intake/Output from previous day: 03/04 0701 - 03/05 0700 In: 2164 [P.O.:800; I.V.:1210; IV Piggyback:154] Out: 750 [Urine:600; Blood:150] Intake/Output this shift: No intake/output data recorded.  Physical Exam:  General: Alert and oriented. CV: Regular rate Lungs: Normal work of breathing GI: Soft, Nondistended. Incisions: Clean and dry. Urine: Clear, pink tinged.  Extremities: Nontender, no erythema, no edema.  Lab Results: Recent Labs    02/21/20 1100 02/22/20 0415  HGB 9.6* 8.2*  HCT 29.8* 25.9*          Recent Labs    02/21/20 1100 02/22/20 0415  CREATININE 2.09* 2.45*           Results for orders placed or performed during the hospital encounter of 02/21/20 (from the past 24 hour(s))  Glucose, capillary     Status: Abnormal   Collection Time: 02/21/20  9:05 AM  Result Value Ref Range   Glucose-Capillary 284 (H) 70 - 99 mg/dL  Glucose, capillary     Status: Abnormal   Collection Time: 02/21/20 10:56 AM  Result Value Ref Range   Glucose-Capillary 266 (H) 70 - 99 mg/dL  Basic metabolic panel     Status: Abnormal   Collection Time: 02/21/20 11:00 AM  Result Value Ref Range   Sodium 140 135 - 145 mmol/L   Potassium 4.3 3.5 - 5.1 mmol/L   Chloride 108 98 - 111 mmol/L   CO2 23 22 - 32 mmol/L   Glucose, Bld 305 (H) 70 - 99 mg/dL   BUN 35 (H) 6 - 20 mg/dL   Creatinine, Ser 2.09 (H) 0.44 - 1.00 mg/dL   Calcium 8.4 (L) 8.9 - 10.3 mg/dL   GFR calc non Af Amer 27 (L) >60 mL/min   GFR calc Af Amer 31 (L) >60 mL/min   Anion gap 9 5 - 15  CBC     Status: Abnormal   Collection Time: 02/21/20  11:00 AM  Result Value Ref Range   WBC 12.2 (H) 4.0 - 10.5 K/uL   RBC 3.27 (L) 3.87 - 5.11 MIL/uL   Hemoglobin 9.6 (L) 12.0 - 15.0 g/dL   HCT 29.8 (L) 36.0 - 46.0 %   MCV 91.1 80.0 - 100.0 fL   MCH 29.4 26.0 - 34.0 pg   MCHC 32.2 30.0 - 36.0 g/dL   RDW 14.1 11.5 - 15.5 %   Platelets 265 150 - 400 K/uL   nRBC 0.0 0.0 - 0.2 %  Glucose, capillary     Status: Abnormal   Collection Time: 02/21/20 12:41 PM  Result Value Ref Range   Glucose-Capillary 282 (H) 70 - 99 mg/dL  Glucose, capillary     Status: Abnormal   Collection Time: 02/21/20  9:09 PM  Result Value Ref Range   Glucose-Capillary 432 (H) 70 - 99 mg/dL  Glucose, random     Status: Abnormal   Collection Time: 02/21/20  9:51 PM  Result Value Ref Range   Glucose, Bld 495 (H) 70 - 99 mg/dL  Glucose, capillary     Status: Abnormal   Collection Time: 02/22/20  1:48 AM  Result Value Ref Range  Glucose-Capillary 327 (H) 70 - 99 mg/dL  CBC     Status: Abnormal   Collection Time: 02/22/20  4:15 AM  Result Value Ref Range   WBC 12.2 (H) 4.0 - 10.5 K/uL   RBC 2.85 (L) 3.87 - 5.11 MIL/uL   Hemoglobin 8.2 (L) 12.0 - 15.0 g/dL   HCT 25.9 (L) 36.0 - 46.0 %   MCV 90.9 80.0 - 100.0 fL   MCH 28.8 26.0 - 34.0 pg   MCHC 31.7 30.0 - 36.0 g/dL   RDW 13.9 11.5 - 15.5 %   Platelets 250 150 - 400 K/uL   nRBC 0.0 0.0 - 0.2 %  Basic metabolic panel     Status: Abnormal   Collection Time: 02/22/20  4:15 AM  Result Value Ref Range   Sodium 133 (L) 135 - 145 mmol/L   Potassium 4.7 3.5 - 5.1 mmol/L   Chloride 100 98 - 111 mmol/L   CO2 23 22 - 32 mmol/L   Glucose, Bld 306 (H) 70 - 99 mg/dL   BUN 40 (H) 6 - 20 mg/dL   Creatinine, Ser 2.45 (H) 0.44 - 1.00 mg/dL   Calcium 8.6 (L) 8.9 - 10.3 mg/dL   GFR calc non Af Amer 22 (L) >60 mL/min   GFR calc Af Amer 26 (L) >60 mL/min   Anion gap 10 5 - 15    Assessment/Plan: POD# 1 s/p repeat left PCNL.   1) Ambulate, Incentive spirometry 2) Advance diet as tolerated 3) Oral pain medication 4)  D/C urethral catheter 5) D/C central line  Likely discharge today.    LOS: 1 day   Haskel Schroeder 02/22/2020, 7:51 AM

## 2020-02-22 NOTE — Progress Notes (Signed)
Central line placed yesterday without complication.  Reviewed placement images and there is an indeterminate curvilinear density overlying the right lung apex on final image.  This density is not consistent with a retained wire but indeterminate.  Therefore, follow up CXR performed this morning.  I reviewed the CXR and this curvilinear density is not present and likely represented a structure external to the patient during the central line placement.  Central line remains well positioned without complicating features.

## 2020-02-22 NOTE — Discharge Instructions (Signed)
1.  Activity:  You are encouraged to ambulate frequently (about every hour during waking hours) to help prevent blood clots from forming in your legs or lungs.  However, you should not engage in any heavy lifting (> 10-15 lbs), strenuous activity, or straining. 2. Diet: You should advance your diet as instructed by your physician.  It will be normal to have some bloating, nausea, and abdominal discomfort intermittently. 3. Prescriptions:  You will be provided a prescription for pain medication to take as needed.  If your pain is not severe enough to require the prescription pain medication, you may take extra strength Tylenol instead which will have less side effects.  You should also take a  stool softener to avoid straining with bowel movements as the prescription pain medication may constipate you. 4. You should take care of the nephrostomy tube as you have before. 5. You should continue the antibiotic Augmentin. We sent a refill to the pharmacy for you. 6. You should continue holding Eliquis. We have cleared this by your cardiologist. 7. You should start taking the iron supplement that we prescribed to you. 8. What to call us about: You should call the office [phone]) if you develop fever > 101 or develop persistent vomiting.

## 2020-02-22 NOTE — Discharge Summary (Signed)
Date of admission: 02/21/2020  Date of discharge: 02/22/2020  Admission diagnosis: Nephrolithiasis  Discharge diagnosis: Nephrolithiasis  History and Physical: For full details, please see admission history and physical. Briefly, Adrienne Lane is a 54 y.o. female with bilateral nephrolithiasis presenting for repeat left PCNL.  After discussing management/treatment options, she elected to proceed with surgical treatment.  Hospital Course: Adrienne Lane was taken to the operating room on 02/21/2020 and underwent a left PCNL. She tolerated this procedure well and without complications. Postoperatively, she was able to be transferred to a regular hospital room following recovery from anesthesia.  She was able to begin ambulating the night of surgery. She remained hemodynamically stable overnight.  She had excellent urine output and foley was removed on POD #1.  Her pain was well controlled, she tolerated a regular diet, and had met all discharge criteria and was able to be discharged home later on POD#1. She is being discharge with left nephrostomy tube in place.  Laboratory values:  Recent Labs    02/21/20 1100 02/22/20 0415  HGB 9.6* 8.2*  HCT 29.8* 25.9*    Disposition: Home  Discharge instruction:  Discussed with patient.   Discharge medications:   Allergies as of 02/22/2020      Reactions   Adhesive [tape]    Tears skin, can tolerate paper tape      Medication List    TAKE these medications   acetaminophen 500 MG tablet Commonly known as: TYLENOL Take 500 mg by mouth every 6 (six) hours as needed.   amLODipine 5 MG tablet Commonly known as: NORVASC Take 1 tablet (5 mg total) by mouth daily. What changed: when to take this   amoxicillin-clavulanate 875-125 MG tablet Commonly known as: AUGMENTIN Take 1 tablet by mouth every 12 (twelve) hours for 21 days.   apixaban 5 MG Tabs tablet Commonly known as: ELIQUIS Take 1 tablet (5 mg total) by mouth 2 (two) times daily.    carvedilol 25 MG tablet Commonly known as: COREG Take 1 tablet (25 mg total) by mouth 2 (two) times daily with a meal.   ferrous sulfate 325 (65 FE) MG tablet Take 1 tablet (325 mg total) by mouth daily with breakfast.   glucose blood test strip Use as instructed   hydrALAZINE 100 MG tablet Commonly known as: APRESOLINE Take 1 tablet (100 mg total) by mouth every 8 (eight) hours.   insulin glargine 100 UNIT/ML injection Commonly known as: LANTUS Inject 15 Units into the skin every morning.   INSULIN SYRINGE .5CC/30GX1/2" 30G X 1/2" 0.5 ML Misc 12 Units by Does not apply route at bedtime.   isosorbide mononitrate 60 MG 24 hr tablet Commonly known as: IMDUR Take 1 tablet (60 mg total) by mouth daily.   mexiletine 150 MG capsule Commonly known as: MEXITIL Take 2 capsules (300 mg total) by mouth every 12 (twelve) hours.   NUTRITIONAL SHAKE PO Take by mouth. For potassium   onetouch ultrasoft lancets Check CBG twice a day   oxyCODONE 5 MG immediate release tablet Commonly known as: Roxicodone Take 1 tablet (5 mg total) by mouth every 4 (four) hours as needed.   potassium chloride 10 MEQ tablet Commonly known as: KLOR-CON Take 4 tablets (40 mEq total) by mouth daily.   torsemide 20 MG tablet Commonly known as: DEMADEX Take 2 tablets (40 mg total) by mouth 2 (two) times daily.   WOMENS MULTI PO Take 1 tablet by mouth daily.       Followup:  We  will call patient to schedule follow up procedure.

## 2020-02-25 ENCOUNTER — Telehealth: Payer: Self-pay

## 2020-02-25 ENCOUNTER — Encounter (HOSPITAL_COMMUNITY): Payer: Self-pay | Admitting: Urology

## 2020-02-25 ENCOUNTER — Other Ambulatory Visit: Payer: Self-pay | Admitting: Urology

## 2020-02-25 ENCOUNTER — Other Ambulatory Visit (HOSPITAL_COMMUNITY)
Admission: RE | Admit: 2020-02-25 | Discharge: 2020-02-25 | Disposition: A | Discharge status: Home / Self Care | Payer: HRSA Program | Source: Ambulatory Visit | Attending: Urology | Admitting: Urology

## 2020-02-25 DIAGNOSIS — Z20822 Contact with and (suspected) exposure to covid-19: Secondary | ICD-10-CM | POA: Insufficient documentation

## 2020-02-25 DIAGNOSIS — Z01812 Encounter for preprocedural laboratory examination: Secondary | ICD-10-CM | POA: Insufficient documentation

## 2020-02-25 LAB — SARS CORONAVIRUS 2 (TAT 6-24 HRS): SARS Coronavirus 2: NEGATIVE

## 2020-02-25 NOTE — Progress Notes (Signed)
Anesthesia Review:  PCP: Cardiologist :Taylor,Bensimhon  Chest x-ray :02/22/20 EKG :12/31/19 Echo :02/14/20 Cardiac Cath :11/09/19  Sleep Study/ CPAP : Fasting Blood Sugar :      / Checks Blood Sugar -- times a day:   Blood Thinner/ Instructions /Last Dose: ASA / Instructions/ Last Dose :  Last dose of Eliquis on 02/10/20 Patient denies shortness of breath, chest pain, fever, and cough at this phone interview. Patient discharged on 02/21/20.  HGB- 8.2 on 3/5 last hgba1c-8.4 on 02/06/20 bmp-done 3/5-epic

## 2020-02-25 NOTE — Anesthesia Preprocedure Evaluation (Addendum)
Anesthesia Evaluation  Patient identified by MRN, date of birth, ID bandGeneral Assessment Comment:Patient awake but reports fatigue  Reviewed: Allergy & Precautions, NPO status , Patient's Chart, lab work & pertinent test results, reviewed documented beta blocker date and time   Airway Mallampati: I  TM Distance: >3 FB Neck ROM: Full    Dental no notable dental hx.    Pulmonary neg pulmonary ROS,    Pulmonary exam normal breath sounds clear to auscultation       Cardiovascular hypertension, Pt. on medications and Pt. on home beta blockers +CHF  Normal cardiovascular exam+ dysrhythmias Atrial Fibrillation  Rhythm:Regular Rate:Normal  ECHO: 1. Inferobasal hypokinesis EF hard to judge as patient in bigeminal rhythm most of study No starin imaging done . Left ventricular ejection fraction, by estimation, is 50 to 55%. The left ventricle has low normal function. The left ventricle demonstrates regional wall motion abnormalities (see scoring diagram/findings for description). The left ventricular internal cavity size was mildly dilated. There is mild left ventricular hypertrophy. Left ventricular diastolic parameters are indeterminate. 2. Right ventricular systolic function is normal. The right ventricular size is normal. There is mildly elevated pulmonary artery systolic pressure. 3. Left atrial size was moderately dilated. 4. The mitral valve is normal in structure and function. Mild mitral valve regurgitation. 5. Tricuspid valve regurgitation is mild to moderate. 6. The aortic valve is tricuspid. Aortic valve regurgitation is trivial. Mild aortic valve sclerosis is present, with no evidence of aortic valve stenosis. 7. The inferior vena cava is dilated in size with >50% respiratory variability, suggesting right atrial pressure of 8 mmHg.   Neuro/Psych negative neurological ROS  negative psych ROS   GI/Hepatic negative GI ROS, Neg  liver ROS,   Endo/Other  diabetes, Insulin Dependent  Renal/GU CRFRenal disease     Musculoskeletal negative musculoskeletal ROS (+)   Abdominal (+) + obese,   Peds  Hematology  (+) anemia ,   Anesthesia Other Findings LEFT PARTIAL STAGHORN STONE  Reproductive/Obstetrics hcg negative                            Anesthesia Physical Anesthesia Plan  ASA: III  Anesthesia Plan: General   Post-op Pain Management:    Induction: Intravenous  PONV Risk Score and Plan: 4 or greater and Dexamethasone, Ondansetron and Treatment may vary due to age or medical condition  Airway Management Planned: Oral ETT  Additional Equipment:   Intra-op Plan:   Post-operative Plan: Extubation in OR  Informed Consent: I have reviewed the patients History and Physical, chart, labs and discussed the procedure including the risks, benefits and alternatives for the proposed anesthesia with the patient or authorized representative who has indicated his/her understanding and acceptance.     Dental advisory given  Plan Discussed with: CRNA  Anesthesia Plan Comments:        Anesthesia Quick Evaluation

## 2020-02-25 NOTE — Telephone Encounter (Signed)
Transition Care Management Follow-up Telephone Call  Date of discharge and from where: 02/22/2020, [facility]  How have you been since you were released from the hospital? She said she is feeling" pretty good."  She noted that she will be returning to the hospital tomorrow to have the rest of her kidney stone removed. She said that she just received a call about that procedure this  morning.  She has COVID test today at 1405.  Any questions or concerns?  none at this time  Items Reviewed:  Did the pt receive and understand the discharge instructions provided?  she has the instructions and did not have any questions.   Medications obtained and verified?  She said that she has all medications except the ferrous sulfate.  No questions about her medications.  She said that she continues to hold the eliquis as she was instructed   Any new allergies since your discharge?   None reported   Do you have support at home? yes, her husband  Other (ie: DME, Home Health, etc) no home health ordered   Has glucometer. Blood sugar this morning - 234  She did not have any questions about her left nephrostomy tube or care of the tube/drainiage bag.    Functional Questionnaire: (I = Independent and D = Dependent) ADL's:independent   Follow up appointments reviewed:    PCP Hospital f/u appt confirmed?[facility] Juleen China 03/10/2020. She did not want to schedule an appointment any sooner.   [facility] f/u appt confirmed? .cardiology appointment 03/04/2020  Are transportation arrangements needed? no, she has transportation.   If their condition worsens, is the pt aware to call  their PCP or go to the ED? yes  Was the patient provided with contact information for the PCP's office or ED?  She has the phone number for the clinic  Was the pt encouraged to call back with questions or concerns?  yes

## 2020-02-26 ENCOUNTER — Ambulatory Visit (HOSPITAL_COMMUNITY): Payer: Self-pay | Admitting: Physician Assistant

## 2020-02-26 ENCOUNTER — Other Ambulatory Visit: Payer: Self-pay

## 2020-02-26 ENCOUNTER — Encounter (HOSPITAL_COMMUNITY): Payer: Self-pay | Admitting: Urology

## 2020-02-26 ENCOUNTER — Encounter (HOSPITAL_COMMUNITY)
Admission: RE | Disposition: A | Discharge status: Home / Self Care | Payer: Self-pay | Source: Home / Self Care | Attending: Urology

## 2020-02-26 ENCOUNTER — Ambulatory Visit (HOSPITAL_COMMUNITY): Payer: Self-pay

## 2020-02-26 ENCOUNTER — Inpatient Hospital Stay (HOSPITAL_COMMUNITY)
Admission: RE | Admit: 2020-02-26 | Discharge: 2020-02-27 | DRG: 660 | Disposition: A | Discharge status: Home / Self Care | Payer: Self-pay | Attending: Urology | Admitting: Urology

## 2020-02-26 DIAGNOSIS — I5032 Chronic diastolic (congestive) heart failure: Secondary | ICD-10-CM | POA: Diagnosis present

## 2020-02-26 DIAGNOSIS — E119 Type 2 diabetes mellitus without complications: Secondary | ICD-10-CM

## 2020-02-26 DIAGNOSIS — E1165 Type 2 diabetes mellitus with hyperglycemia: Secondary | ICD-10-CM | POA: Diagnosis present

## 2020-02-26 DIAGNOSIS — Z20822 Contact with and (suspected) exposure to covid-19: Secondary | ICD-10-CM | POA: Diagnosis present

## 2020-02-26 DIAGNOSIS — Z794 Long term (current) use of insulin: Secondary | ICD-10-CM

## 2020-02-26 DIAGNOSIS — Z6835 Body mass index (BMI) 35.0-35.9, adult: Secondary | ICD-10-CM

## 2020-02-26 DIAGNOSIS — Z7901 Long term (current) use of anticoagulants: Secondary | ICD-10-CM

## 2020-02-26 DIAGNOSIS — Z8249 Family history of ischemic heart disease and other diseases of the circulatory system: Secondary | ICD-10-CM

## 2020-02-26 DIAGNOSIS — N184 Chronic kidney disease, stage 4 (severe): Secondary | ICD-10-CM | POA: Diagnosis present

## 2020-02-26 DIAGNOSIS — N200 Calculus of kidney: Principal | ICD-10-CM | POA: Diagnosis present

## 2020-02-26 DIAGNOSIS — Z79899 Other long term (current) drug therapy: Secondary | ICD-10-CM

## 2020-02-26 DIAGNOSIS — I428 Other cardiomyopathies: Secondary | ICD-10-CM | POA: Diagnosis present

## 2020-02-26 DIAGNOSIS — E669 Obesity, unspecified: Secondary | ICD-10-CM | POA: Diagnosis present

## 2020-02-26 DIAGNOSIS — Z9289 Personal history of other medical treatment: Secondary | ICD-10-CM

## 2020-02-26 DIAGNOSIS — E1122 Type 2 diabetes mellitus with diabetic chronic kidney disease: Secondary | ICD-10-CM | POA: Diagnosis present

## 2020-02-26 DIAGNOSIS — I10 Essential (primary) hypertension: Secondary | ICD-10-CM | POA: Diagnosis present

## 2020-02-26 DIAGNOSIS — Z91048 Other nonmedicinal substance allergy status: Secondary | ICD-10-CM

## 2020-02-26 DIAGNOSIS — Z87442 Personal history of urinary calculi: Secondary | ICD-10-CM

## 2020-02-26 DIAGNOSIS — I130 Hypertensive heart and chronic kidney disease with heart failure and stage 1 through stage 4 chronic kidney disease, or unspecified chronic kidney disease: Secondary | ICD-10-CM | POA: Diagnosis present

## 2020-02-26 DIAGNOSIS — I480 Paroxysmal atrial fibrillation: Secondary | ICD-10-CM | POA: Diagnosis present

## 2020-02-26 DIAGNOSIS — Z833 Family history of diabetes mellitus: Secondary | ICD-10-CM

## 2020-02-26 DIAGNOSIS — D649 Anemia, unspecified: Secondary | ICD-10-CM | POA: Diagnosis present

## 2020-02-26 HISTORY — DX: Personal history of other medical treatment: Z92.89

## 2020-02-26 HISTORY — PX: NEPHROLITHOTOMY: SHX5134

## 2020-02-26 LAB — CBC
HCT: 26 % — ABNORMAL LOW (ref 36.0–46.0)
Hemoglobin: 8.2 g/dL — ABNORMAL LOW (ref 12.0–15.0)
MCH: 29.2 pg (ref 26.0–34.0)
MCHC: 31.5 g/dL (ref 30.0–36.0)
MCV: 92.5 fL (ref 80.0–100.0)
Platelets: 262 10*3/uL (ref 150–400)
RBC: 2.81 MIL/uL — ABNORMAL LOW (ref 3.87–5.11)
RDW: 14.1 % (ref 11.5–15.5)
WBC: 13.3 10*3/uL — ABNORMAL HIGH (ref 4.0–10.5)
nRBC: 0 % (ref 0.0–0.2)

## 2020-02-26 LAB — GLUCOSE, CAPILLARY
Glucose-Capillary: 283 mg/dL — ABNORMAL HIGH (ref 70–99)
Glucose-Capillary: 273 mg/dL — ABNORMAL HIGH (ref 70–99)
Glucose-Capillary: 352 mg/dL — ABNORMAL HIGH (ref 70–99)
Glucose-Capillary: 392 mg/dL — ABNORMAL HIGH (ref 70–99)

## 2020-02-26 LAB — POCT I-STAT, CHEM 8
BUN: 32 mg/dL — ABNORMAL HIGH (ref 6–20)
Calcium, Ion: 1.27 mmol/L (ref 1.15–1.40)
Chloride: 105 mmol/L (ref 98–111)
Creatinine, Ser: 2.5 mg/dL — ABNORMAL HIGH (ref 0.44–1.00)
Glucose, Bld: 275 mg/dL — ABNORMAL HIGH (ref 70–99)
HCT: 23 % — ABNORMAL LOW (ref 36.0–46.0)
Hemoglobin: 7.8 g/dL — ABNORMAL LOW (ref 12.0–15.0)
Potassium: 4.1 mmol/L (ref 3.5–5.1)
Sodium: 139 mmol/L (ref 135–145)
TCO2: 25 mmol/L (ref 22–32)

## 2020-02-26 LAB — BASIC METABOLIC PANEL
Anion gap: 10 (ref 5–15)
BUN: 36 mg/dL — ABNORMAL HIGH (ref 6–20)
CO2: 21 mmol/L — ABNORMAL LOW (ref 22–32)
Calcium: 8.7 mg/dL — ABNORMAL LOW (ref 8.9–10.3)
Chloride: 107 mmol/L (ref 98–111)
Creatinine, Ser: 2.46 mg/dL — ABNORMAL HIGH (ref 0.44–1.00)
GFR calc Af Amer: 25 mL/min — ABNORMAL LOW (ref >60)
GFR calc non Af Amer: 22 mL/min — ABNORMAL LOW (ref >60)
Glucose, Bld: 274 mg/dL — ABNORMAL HIGH (ref 70–99)
Potassium: 4.3 mmol/L (ref 3.5–5.1)
Sodium: 138 mmol/L (ref 135–145)

## 2020-02-26 LAB — MAGNESIUM: Magnesium: 2 mg/dL (ref 1.7–2.4)

## 2020-02-26 LAB — PREGNANCY, URINE: Preg Test, Ur: NEGATIVE

## 2020-02-26 LAB — PREPARE RBC (CROSSMATCH)

## 2020-02-26 SURGERY — NEPHROLITHOTOMY PERCUTANEOUS
Anesthesia: General | Laterality: Left

## 2020-02-26 MED ORDER — LACTATED RINGERS IV SOLN: INTRAVENOUS | Status: DC | PRN

## 2020-02-26 MED ORDER — LACTATED RINGERS IV SOLN: INTRAVENOUS | Status: DC

## 2020-02-26 MED ORDER — 0.9 % SODIUM CHLORIDE (POUR BTL) OPTIME
TOPICAL | Status: DC | PRN
  Administered 2020-02-26: 1000 mL

## 2020-02-26 MED ORDER — INSULIN ASPART 100 UNIT/ML ~~LOC~~ SOLN
0.0000 [IU] | Freq: Three times a day (TID) | SUBCUTANEOUS | Status: DC
  Administered 2020-02-26: 15 [IU] via SUBCUTANEOUS
  Administered 2020-02-27: 5 [IU] via SUBCUTANEOUS
  Administered 2020-02-27: 3 [IU] via SUBCUTANEOUS

## 2020-02-26 MED ORDER — PROMETHAZINE HCL 25 MG/ML IJ SOLN: 6.2500 mg | INTRAMUSCULAR | Status: DC | PRN

## 2020-02-26 MED ORDER — SUGAMMADEX SODIUM 500 MG/5ML IV SOLN
INTRAVENOUS | Status: DC | PRN
  Administered 2020-02-26: 250 mg via INTRAVENOUS

## 2020-02-26 MED ORDER — FENTANYL CITRATE (PF) 100 MCG/2ML IJ SOLN
INTRAMUSCULAR | Status: DC | PRN
  Administered 2020-02-26: 100 ug via INTRAVENOUS

## 2020-02-26 MED ORDER — PROPOFOL 10 MG/ML IV BOLUS
INTRAVENOUS | Status: AC
  Filled 2020-02-26: qty 20

## 2020-02-26 MED ORDER — INSULIN ASPART 100 UNIT/ML ~~LOC~~ SOLN
SUBCUTANEOUS | Status: AC
  Filled 2020-02-26: qty 1

## 2020-02-26 MED ORDER — DEXAMETHASONE SODIUM PHOSPHATE 10 MG/ML IJ SOLN
INTRAMUSCULAR | Status: DC | PRN
  Administered 2020-02-26: 4 mg via INTRAVENOUS

## 2020-02-26 MED ORDER — OXYCODONE HCL 5 MG PO TABS: 5.0000 mg | ORAL_TABLET | ORAL | Status: DC | PRN

## 2020-02-26 MED ORDER — FERROUS SULFATE 325 (65 FE) MG PO TABS
325.0000 mg | ORAL_TABLET | Freq: Every day | ORAL | Status: DC
  Administered 2020-02-27: 325 mg via ORAL
  Filled 2020-02-26: qty 1

## 2020-02-26 MED ORDER — MEXILETINE HCL 150 MG PO CAPS
300.0000 mg | ORAL_CAPSULE | Freq: Two times a day (BID) | ORAL | Status: DC
  Administered 2020-02-26 – 2020-02-27 (×2): 300 mg via ORAL
  Filled 2020-02-26 (×3): qty 2

## 2020-02-26 MED ORDER — OXYCODONE HCL 5 MG/5ML PO SOLN: 5.0000 mg | Freq: Once | ORAL | Status: DC | PRN

## 2020-02-26 MED ORDER — AMOXICILLIN-POT CLAVULANATE 875-125 MG PO TABS: 1.0000 | ORAL_TABLET | Freq: Two times a day (BID) | ORAL | Status: DC

## 2020-02-26 MED ORDER — LIDOCAINE HCL (CARDIAC) PF 100 MG/5ML IV SOSY
PREFILLED_SYRINGE | INTRAVENOUS | Status: DC | PRN
  Administered 2020-02-26: 60 mg via INTRAVENOUS

## 2020-02-26 MED ORDER — DEXAMETHASONE SODIUM PHOSPHATE 10 MG/ML IJ SOLN
INTRAMUSCULAR | Status: AC
  Filled 2020-02-26: qty 1

## 2020-02-26 MED ORDER — BUPIVACAINE-EPINEPHRINE 0.5% -1:200000 IJ SOLN
INTRAMUSCULAR | Status: AC
  Filled 2020-02-26: qty 1

## 2020-02-26 MED ORDER — FENTANYL CITRATE (PF) 100 MCG/2ML IJ SOLN
INTRAMUSCULAR | Status: AC
  Filled 2020-02-26: qty 2

## 2020-02-26 MED ORDER — ROCURONIUM BROMIDE 10 MG/ML (PF) SYRINGE
PREFILLED_SYRINGE | INTRAVENOUS | Status: AC
  Filled 2020-02-26: qty 10

## 2020-02-26 MED ORDER — MIDAZOLAM HCL 2 MG/2ML IJ SOLN
INTRAMUSCULAR | Status: AC
  Filled 2020-02-26: qty 2

## 2020-02-26 MED ORDER — CARVEDILOL 25 MG PO TABS
25.0000 mg | ORAL_TABLET | Freq: Two times a day (BID) | ORAL | Status: DC
  Administered 2020-02-27: 25 mg via ORAL
  Filled 2020-02-26: qty 1

## 2020-02-26 MED ORDER — INSULIN ASPART 100 UNIT/ML ~~LOC~~ SOLN
5.0000 [IU] | Freq: Once | SUBCUTANEOUS | Status: AC
  Administered 2020-02-26: 5 [IU] via SUBCUTANEOUS

## 2020-02-26 MED ORDER — HYDROMORPHONE HCL 1 MG/ML IJ SOLN: 0.2500 mg | INTRAMUSCULAR | Status: DC | PRN

## 2020-02-26 MED ORDER — SODIUM CHLORIDE 0.9 % IR SOLN
Status: DC | PRN
  Administered 2020-02-26: 30000 mL

## 2020-02-26 MED ORDER — DOCUSATE SODIUM 100 MG PO CAPS
100.0000 mg | ORAL_CAPSULE | Freq: Two times a day (BID) | ORAL | Status: DC
  Administered 2020-02-26 – 2020-02-27 (×2): 100 mg via ORAL
  Filled 2020-02-26 (×2): qty 1

## 2020-02-26 MED ORDER — INSULIN GLARGINE 100 UNIT/ML ~~LOC~~ SOLN
15.0000 [IU] | Freq: Every day | SUBCUTANEOUS | Status: DC
  Administered 2020-02-26 – 2020-02-27 (×2): 15 [IU] via SUBCUTANEOUS
  Filled 2020-02-26 (×2): qty 0.15

## 2020-02-26 MED ORDER — CEFAZOLIN SODIUM-DEXTROSE 2-4 GM/100ML-% IV SOLN
2.0000 g | Freq: Three times a day (TID) | INTRAVENOUS | Status: DC
  Administered 2020-02-26: 2 g via INTRAVENOUS
  Filled 2020-02-26 (×2): qty 100

## 2020-02-26 MED ORDER — CHLORHEXIDINE GLUCONATE CLOTH 2 % EX PADS: 6.0000 | MEDICATED_PAD | Freq: Every day | CUTANEOUS | Status: DC

## 2020-02-26 MED ORDER — AMLODIPINE BESYLATE 5 MG PO TABS
5.0000 mg | ORAL_TABLET | ORAL | Status: DC
  Administered 2020-02-27: 5 mg via ORAL
  Filled 2020-02-26: qty 1

## 2020-02-26 MED ORDER — PHENYLEPHRINE HCL-NACL 10-0.9 MG/250ML-% IV SOLN
INTRAVENOUS | Status: DC | PRN
  Administered 2020-02-26: 25 ug/min via INTRAVENOUS

## 2020-02-26 MED ORDER — CEFAZOLIN SODIUM-DEXTROSE 1-4 GM/50ML-% IV SOLN
1.0000 g | Freq: Three times a day (TID) | INTRAVENOUS | Status: AC
  Administered 2020-02-26 – 2020-02-27 (×2): 1 g via INTRAVENOUS
  Filled 2020-02-26 (×3): qty 50

## 2020-02-26 MED ORDER — GLYCOPYRROLATE PF 0.2 MG/ML IJ SOSY
PREFILLED_SYRINGE | INTRAMUSCULAR | Status: AC
  Filled 2020-02-26: qty 1

## 2020-02-26 MED ORDER — SUGAMMADEX SODIUM 500 MG/5ML IV SOLN
INTRAVENOUS | Status: AC
  Filled 2020-02-26: qty 5

## 2020-02-26 MED ORDER — PROPOFOL 10 MG/ML IV BOLUS
INTRAVENOUS | Status: DC | PRN
  Administered 2020-02-26: 150 mg via INTRAVENOUS

## 2020-02-26 MED ORDER — INSULIN ASPART 100 UNIT/ML ~~LOC~~ SOLN
SUBCUTANEOUS | Status: DC | PRN
  Administered 2020-02-26: 4 [IU] via SUBCUTANEOUS

## 2020-02-26 MED ORDER — ISOSORBIDE MONONITRATE ER 60 MG PO TB24
60.0000 mg | ORAL_TABLET | Freq: Every day | ORAL | Status: DC
  Administered 2020-02-27: 60 mg via ORAL
  Filled 2020-02-26: qty 1

## 2020-02-26 MED ORDER — ENSURE ENLIVE PO LIQD: 237.0000 mL | Freq: Two times a day (BID) | ORAL | Status: DC

## 2020-02-26 MED ORDER — ACETAMINOPHEN 500 MG PO TABS
1000.0000 mg | ORAL_TABLET | Freq: Four times a day (QID) | ORAL | Status: DC
  Administered 2020-02-26 – 2020-02-27 (×5): 1000 mg via ORAL
  Filled 2020-02-26 (×4): qty 2

## 2020-02-26 MED ORDER — HYDRALAZINE HCL 50 MG PO TABS
100.0000 mg | ORAL_TABLET | Freq: Three times a day (TID) | ORAL | Status: DC
  Administered 2020-02-27: 100 mg via ORAL
  Filled 2020-02-26: qty 2

## 2020-02-26 MED ORDER — PHENYLEPHRINE HCL (PRESSORS) 10 MG/ML IV SOLN
INTRAVENOUS | Status: AC
  Filled 2020-02-26: qty 1

## 2020-02-26 MED ORDER — SENNA 8.6 MG PO TABS
1.0000 | ORAL_TABLET | Freq: Two times a day (BID) | ORAL | Status: DC
  Administered 2020-02-26 – 2020-02-27 (×2): 8.6 mg via ORAL
  Filled 2020-02-26 (×2): qty 1

## 2020-02-26 MED ORDER — IOHEXOL 300 MG/ML  SOLN
INTRAMUSCULAR | Status: DC | PRN
  Administered 2020-02-26: 46 mL

## 2020-02-26 MED ORDER — LIDOCAINE 2% (20 MG/ML) 5 ML SYRINGE
INTRAMUSCULAR | Status: AC
  Filled 2020-02-26: qty 5

## 2020-02-26 MED ORDER — POTASSIUM CHLORIDE ER 10 MEQ PO TBCR
40.0000 meq | EXTENDED_RELEASE_TABLET | Freq: Every day | ORAL | Status: DC
  Administered 2020-02-27: 40 meq via ORAL
  Filled 2020-02-26 (×2): qty 4

## 2020-02-26 MED ORDER — GENTAMICIN SULFATE 40 MG/ML IJ SOLN
520.0000 mg | Freq: Once | INTRAVENOUS | Status: AC
  Administered 2020-02-26: 520 mg via INTRAVENOUS
  Filled 2020-02-26: qty 13

## 2020-02-26 MED ORDER — ONDANSETRON HCL 4 MG/2ML IJ SOLN
INTRAMUSCULAR | Status: DC | PRN
  Administered 2020-02-26: 4 mg via INTRAVENOUS

## 2020-02-26 MED ORDER — SODIUM CHLORIDE 0.9% IV SOLUTION: Freq: Once | INTRAVENOUS | Status: DC

## 2020-02-26 MED ORDER — ACETAMINOPHEN 500 MG PO TABS
1000.0000 mg | ORAL_TABLET | Freq: Once | ORAL | Status: AC
  Administered 2020-02-26: 1000 mg via ORAL
  Filled 2020-02-26: qty 2

## 2020-02-26 MED ORDER — BUPIVACAINE-EPINEPHRINE (PF) 0.5% -1:200000 IJ SOLN
INTRAMUSCULAR | Status: AC
  Filled 2020-02-26: qty 1.8

## 2020-02-26 MED ORDER — OXYCODONE HCL 5 MG PO TABS: 5.0000 mg | ORAL_TABLET | Freq: Once | ORAL | Status: DC | PRN

## 2020-02-26 MED ORDER — ONDANSETRON HCL 4 MG/2ML IJ SOLN
INTRAMUSCULAR | Status: AC
  Filled 2020-02-26: qty 2

## 2020-02-26 MED ORDER — ROCURONIUM BROMIDE 100 MG/10ML IV SOLN
INTRAVENOUS | Status: DC | PRN
  Administered 2020-02-26: 20 mg via INTRAVENOUS
  Administered 2020-02-26 (×2): 10 mg via INTRAVENOUS
  Administered 2020-02-26: 60 mg via INTRAVENOUS

## 2020-02-26 MED ORDER — TORSEMIDE 20 MG PO TABS
40.0000 mg | ORAL_TABLET | Freq: Two times a day (BID) | ORAL | Status: DC
  Administered 2020-02-27: 40 mg via ORAL
  Filled 2020-02-26 (×2): qty 2

## 2020-02-26 MED ORDER — BUPIVACAINE-EPINEPHRINE 0.5% -1:200000 IJ SOLN
INTRAMUSCULAR | Status: DC | PRN
  Administered 2020-02-26: 13 mL

## 2020-02-26 MED ORDER — AMOXICILLIN-POT CLAVULANATE 875-125 MG PO TABS
1.0000 | ORAL_TABLET | Freq: Two times a day (BID) | ORAL | Status: DC
  Administered 2020-02-27: 1 via ORAL
  Filled 2020-02-26: qty 1

## 2020-02-26 SURGICAL SUPPLY — 57 items
BAG URINE DRAIN 2000ML AR STRL (UROLOGICAL SUPPLIES) IMPLANT
BASKET STONE NITINOL 3FRX115MB (UROLOGICAL SUPPLIES) IMPLANT
BASKET ZERO TIP NITINOL 2.4FR (BASKET) IMPLANT
BENZOIN TINCTURE PRP APPL 2/3 (GAUZE/BANDAGES/DRESSINGS) ×2 IMPLANT
CATH FOLEY 2W COUNCIL 20FR 5CC (CATHETERS) IMPLANT
CATH FOLEY 2W COUNCIL 5CC 18FR (CATHETERS) IMPLANT
CATH IMAGER II 65CM (CATHETERS) ×2 IMPLANT
CATH ROBINSON RED A/P 20FR (CATHETERS) IMPLANT
CATH ULTRATHANE 14FR (CATHETERS) ×2 IMPLANT
CATH URET 5FR 28IN OPEN ENDED (CATHETERS) IMPLANT
CATH URET DUAL LUMEN 6-10FR 50 (CATHETERS) ×2 IMPLANT
CATH X-FORCE N30 NEPHROSTOMY (TUBING) ×2 IMPLANT
CHLORAPREP W/TINT 26 (MISCELLANEOUS) ×2 IMPLANT
COVER SURGICAL LIGHT HANDLE (MISCELLANEOUS) ×2 IMPLANT
COVER WAND RF STERILE (DRAPES) IMPLANT
DRAPE C-ARM 42X120 X-RAY (DRAPES) ×2 IMPLANT
DRAPE LINGEMAN PERC (DRAPES) ×2 IMPLANT
DRAPE SURG IRRIG POUCH 19X23 (DRAPES) ×2 IMPLANT
DRAPE UTILITY XL STRL (DRAPES) IMPLANT
DRSG PAD ABDOMINAL 8X10 ST (GAUZE/BANDAGES/DRESSINGS) ×2 IMPLANT
DRSG TEGADERM 8X12 (GAUZE/BANDAGES/DRESSINGS) ×2 IMPLANT
EXTRACTOR STONE NITINOL NGAGE (UROLOGICAL SUPPLIES) IMPLANT
EXTRACTOR STONE PERC NCIRCLE (MISCELLANEOUS) ×2 IMPLANT
FIBER LASER FLEXIVA 365 (UROLOGICAL SUPPLIES) ×2 IMPLANT
FIBER LASER TRAC TIP (UROLOGICAL SUPPLIES) ×2 IMPLANT
GAUZE SPONGE 4X4 12PLY STRL (GAUZE/BANDAGES/DRESSINGS) ×2 IMPLANT
GLOVE BIOGEL M STRL SZ7.5 (GLOVE) ×2 IMPLANT
GOWN STRL REUS W/TWL XL LVL3 (GOWN DISPOSABLE) ×2 IMPLANT
GUIDEWIRE AMPLAZ .035X145 (WIRE) ×2 IMPLANT
GUIDEWIRE ANG ZIPWIRE 038X150 (WIRE) IMPLANT
GUIDEWIRE STR DUAL SENSOR (WIRE) ×2 IMPLANT
IV SET EXTENSION CATH 6 NF (IV SETS) IMPLANT
KIT BASIN OR (CUSTOM PROCEDURE TRAY) ×2 IMPLANT
KIT PROBE 340X3.4XDISP GRN (MISCELLANEOUS) IMPLANT
KIT PROBE TRILOGY 3.4X340 (MISCELLANEOUS)
KIT PROBE TRILOGY 3.9X350 (MISCELLANEOUS) ×2 IMPLANT
KIT TURNOVER KIT A (KITS) IMPLANT
MANIFOLD NEPTUNE II (INSTRUMENTS) ×2 IMPLANT
NEEDLE HYPO 22GX1.5 SAFETY (NEEDLE) IMPLANT
NEEDLE TROCAR 18X15 ECHO (NEEDLE) IMPLANT
NS IRRIG 1000ML POUR BTL (IV SOLUTION) ×2 IMPLANT
PACK CYSTO (CUSTOM PROCEDURE TRAY) ×2 IMPLANT
SHEATH PEELAWAY SET 9 (SHEATH) IMPLANT
SHEATH URETERAL 12FRX35CM (MISCELLANEOUS) IMPLANT
SPONGE LAP 4X18 RFD (DISPOSABLE) ×2 IMPLANT
STENT ENDOURETEROTOMY 7-14 26C (STENTS) IMPLANT
STENT URET 6FRX24 CONTOUR (STENTS) ×2 IMPLANT
SUT SILK 2 0 SH (SUTURE) ×2 IMPLANT
SYR 10ML LL (SYRINGE) ×2 IMPLANT
SYR 20ML LL LF (SYRINGE) ×2 IMPLANT
SYR 50ML LL SCALE MARK (SYRINGE) ×2 IMPLANT
TOWEL OR NON WOVEN STRL DISP B (DISPOSABLE) ×2 IMPLANT
TRAY FOLEY MTR SLVR 16FR STAT (SET/KITS/TRAYS/PACK) IMPLANT
TUBING CONNECTING 10 (TUBING) ×2 IMPLANT
TUBING STONE CATCHER TRILOGY (MISCELLANEOUS) ×2 IMPLANT
TUBING UROLOGY SET (TUBING) ×2 IMPLANT
WATER STERILE IRR 1000ML POUR (IV SOLUTION) IMPLANT

## 2020-02-26 NOTE — Consult Note (Signed)
Medical Consultation   Adrienne Lane  EHU:[id number]  DOB: Dec 30, 1965  DOA: 02/26/2020  PCP: Ladell Pier, MD    Outpatient Specialists:   Requesting physician: Dr. Kerrie Pleasure and Dr. Ellison Hughs.  Reason for consultation: Medical management.  History of Present Illness: Adrienne Lane is an 54 y.o. female with below past medical history paroxysmal atrial fibrillation, frequent PVCs, chronic diastolic CHF, stage IV CKD, type 2 diabetes, uterine fibroids, hypertension, iron deficiency anemia, nonischemic cardiomyopathy, obesity, peripheral edema, urolithiasis who has been followed by urology due to bilateral staghorn stone and microhematuria who underwent a left percutaneous nephrostolithotomy with antegrade nephrostogram with nephrostomy tube placement and antegrade left ureteral stent placement who we are being consulted for medical management.  She was evaluated in PACU after her procedure and stated that she was feeling well, but was hungry.  She denies fever, chills, headache, rhinorrhea, sore throat, dyspnea, wheezing, hemoptysis, chest pain, palpitations, dizziness, diaphoresis, PND or orthopnea.  She occasionally gets lower extremity edema.  No abdominal pain, no flank pain at this time, nausea, emesis, diarrhea, constipation, melena or hematochezia.  No polyuria, polydipsia, polyphagia or blurred vision.  Past Medical History:  Diagnosis Date  . Atrial fibrillation with RVR ([place])   . CHF (congestive heart failure) ([place])   . CKD (chronic kidney disease), stage III   . Diabetes mellitus without complication ([place])   . DOE (dyspnea on exertion)    walking upstairs or up hill resolves in one minute  . Fibroids   . History of kidney stones   . Hypertension   . Iron deficiency anemia   . Non-ischemic cardiomyopathy (HCC)    tachycardia induced  . Obese   . Peripheral edema   . Premature ventricular contractions (PVCs) ([facility])   . Umbilical hernia     . Umbilical hernia   . Wears glasses    Review of Systems:  ROS As per HPI otherwise 10 point review of systems negative.   Past Surgical History: Past Surgical History:  Procedure Laterality Date  . CHOLECYSTECTOMY    . IR FLUORO GUIDE CV LINE RIGHT  02/21/2020  . IR US GUIDE VASC ACCESS RIGHT  02/21/2020  . NEPHROLITHOTOMY Left 02/14/2020   Procedure: NEPHROLITHOTOMY PERCUTANEOUS/ SURGEON ACCESS/ LEFT PERCUTANEOUS NEPHROSTOMY TUBE PLACEMENT;  Surgeon: Ardis Hughs, MD;  Location: [facility];  Service: Urology;  Laterality: Left;  . NEPHROLITHOTOMY Left 02/21/2020   Procedure: NEPHROLITHOTOMY PERCUTANEOUS SECOND LOOK;  Surgeon: Ardis Hughs, MD;  Location: [facility];  Service: Urology;  Laterality: Left;  . RIGHT HEART CATH N/A 11/09/2019   Procedure: RIGHT HEART CATH;  Surgeon: Jolaine Artist, MD;  Location: [facility];  Service: Cardiovascular;  Laterality: N/A;  . WISDOM TOOTH EXTRACTION     Allergies:   Allergies  Allergen Reactions  . Adhesive [Tape]     Tears skin, can tolerate paper tape   Social History:  reports that she has never smoked. She has never used smokeless tobacco. She reports that she does not drink alcohol or use drugs.  Family History: Family History  Problem Relation Age of Onset  . Atrial fibrillation Mother    Physical Exam: Vitals:   02/26/20 1300 02/26/20 1315 02/26/20 1345 02/26/20 1415  BP: 133/63 129/62  135/70  Pulse: (!) 38 (!) 39  79  Resp: (!) 8 19  13   Temp:   98.1 F (36.7 C)  TempSrc:      SpO2: 99% 100%  99%  Weight:      Height:        Constitutional: Alert and awake, oriented x3, not in any acute distress. Eyes: PERLA, EOMI, irises appear normal, anicteric sclera,  ENMT: external ears and nose appear normal, normal hearing.            Lips appears normal, oropharynx mucosa, tongue, posterior pharynx appear normal  Neck: neck appears normal, no masses, normal ROM, no thyromegaly, no JVD  CVS: S1-S2 with  frequent extrasystoles and occasional bigeminy on the cardiac monitor, no murmur rubs or gallops, no LE edema, normal pedal pulses  Respiratory:  clear to auscultation bilaterally, no wheezing, rales or rhonchi. Respiratory effort normal. No accessory muscle use.  Abdomen: soft nontender, nondistended, normal bowel sounds, no hepatosplenomegaly, no hernias  Musculoskeletal: : SCDs in place.  No cyanosis, clubbing or edema noted bilaterally                       Neuro: Cranial nerves II-XII intact, strength, sensation, reflexes Psych: judgement and insight appear normal, stable mood and affect, mental status Skin: no rashes or lesions or ulcers, no induration or nodules   Data reviewed:  I have personally reviewed following labs and imaging studies Labs:  CBC: Recent Labs  Lab 02/21/20 1100 02/22/20 0415 02/26/20 0713 02/26/20 1003  WBC 12.2* 12.2* 13.3*  --   HGB 9.6* 8.2* 8.2* 7.8*  HCT 29.8* 25.9* 26.0* 23.0*  MCV 91.1 90.9 92.5  --   PLT 265 250 262  --     Basic Metabolic Panel: Recent Labs  Lab 02/21/20 1100 02/21/20 1100 02/21/20 2151 02/22/20 0415 02/22/20 0415 02/26/20 0713 02/26/20 1003  NA 140  --   --  133*  --  138 139  K 4.3   < >  --  4.7   < > 4.3 4.1  CL 108  --   --  100  --  107 105  CO2 23  --   --  23  --  21*  --   GLUCOSE 305*  --  495* 306*  --  274* 275*  BUN 35*  --   --  40*  --  36* 32*  CREATININE 2.09*  --   --  2.45*  --  2.46* 2.50*  CALCIUM 8.4*  --   --  8.6*  --  8.7*  --   MG  --   --   --   --   --  2.0  --    < > = values in this interval not displayed.   GFR Estimated Creatinine Clearance: 34 mL/min (A) (by C-G formula based on SCr of 2.5 mg/dL (H)). Liver Function Tests: No results for input(s): AST, ALT, ALKPHOS, BILITOT, PROT, ALBUMIN in the last 168 hours. No results for input(s): LIPASE, AMYLASE in the last 168 hours. No results for input(s): AMMONIA in the last 168 hours. Coagulation profile No results for input(s): INR,  PROTIME in the last 168 hours.  Cardiac Enzymes: No results for input(s): CKTOTAL, CKMB, CKMBINDEX, TROPONINI in the last 168 hours. BNP: Invalid input(s): POCBNP CBG: Recent Labs  Lab 02/22/20 0148 02/22/20 0824 02/22/20 1142 02/26/20 0554 02/26/20 1120  GLUCAP 327* 236* 241* 283* 273*   D-Dimer No results for input(s): DDIMER in the last 72 hours. Hgb A1c No results for input(s): HGBA1C in the last 72 hours. Lipid Profile No results  for input(s): CHOL, HDL, LDLCALC, TRIG, CHOLHDL, LDLDIRECT in the last 72 hours. Thyroid function studies No results for input(s): TSH, T4TOTAL, T3FREE, THYROIDAB in the last 72 hours.  Invalid input(s): FREET3 Anemia work up No results for input(s): VITAMINB12, FOLATE, FERRITIN, TIBC, IRON, RETICCTPCT in the last 72 hours. Urinalysis    Component Value Date/Time   COLORURINE RED (A) 02/08/2020 1159   APPEARANCEUR TURBID (A) 02/08/2020 1159   LABSPEC 1.011 02/08/2020 1159   PHURINE 8.0 02/08/2020 1159   GLUCOSEU NEGATIVE 02/08/2020 1159   HGBUR LARGE (A) 02/08/2020 1159   BILIRUBINUR NEGATIVE 02/08/2020 1159   KETONESUR NEGATIVE 02/08/2020 1159   PROTEINUR 100 (A) 02/08/2020 1159   NITRITE NEGATIVE 02/08/2020 1159   LEUKOCYTESUR LARGE (A) 02/08/2020 1159     Microbiology Recent Results (from the past 240 hour(s))  SARS CORONAVIRUS 2 (TAT 6-24 HRS) Nasopharyngeal Nasopharyngeal Swab     Status: None   Collection Time: 02/18/20  3:35 PM   Specimen: Nasopharyngeal Swab  Result Value Ref Range Status   SARS Coronavirus 2 NEGATIVE NEGATIVE Final    Comment: (NOTE) SARS-CoV-2 target nucleic acids are NOT DETECTED. The SARS-CoV-2 RNA is generally detectable in upper and lower respiratory specimens during the acute phase of infection. Negative results do not preclude SARS-CoV-2 infection, do not rule out co-infections with other pathogens, and should not be used as the sole basis for treatment or other patient management  decisions. Negative results must be combined with clinical observations, patient history, and epidemiological information. The expected result is Negative. Fact Sheet for Patients: SugarRoll.be Fact Sheet for Healthcare Providers: https://www.woods-mathews.com/ This test is not yet approved or cleared by the [place] FDA and  has been authorized for detection and/or diagnosis of SARS-CoV-2 by FDA under an Emergency Use Authorization (EUA). This EUA will remain  in effect (meaning this test can be used) for the duration of the COVID-19 declaration under Section 56 4(b)(1) of the Act, 21 U.S.C. section 360bbb-3(b)(1), unless the authorization is terminated or revoked sooner. Performed at [facility], [place] [address], [place], [place] [address]   SARS CORONAVIRUS 2 (TAT 6-24 HRS) Nasopharyngeal Nasopharyngeal Swab     Status: None   Collection Time: 02/25/20  2:04 PM   Specimen: Nasopharyngeal Swab  Result Value Ref Range Status   SARS Coronavirus 2 NEGATIVE NEGATIVE Final    Comment: (NOTE) SARS-CoV-2 target nucleic acids are NOT DETECTED. The SARS-CoV-2 RNA is generally detectable in upper and lower respiratory specimens during the acute phase of infection. Negative results do not preclude SARS-CoV-2 infection, do not rule out co-infections with other pathogens, and should not be used as the sole basis for treatment or other patient management decisions. Negative results must be combined with clinical observations, patient history, and epidemiological information. The expected result is Negative. Fact Sheet for Patients: SugarRoll.be Fact Sheet for Healthcare Providers: https://www.woods-mathews.com/ This test is not yet approved or cleared by the [place] FDA and  has been authorized for detection and/or diagnosis of SARS-CoV-2 by FDA under an Emergency Use Authorization (EUA). This  EUA will remain  in effect (meaning this test can be used) for the duration of the COVID-19 declaration under Section 56 4(b)(1) of the Act, 21 U.S.C. section 360bbb-3(b)(1), unless the authorization is terminated or revoked sooner. Performed at [facility], [place] [address], [place], [place] [address]        Inpatient Medications:   Scheduled Meds: . sodium chloride   Intravenous Once  Continuous Infusions: .  ceFAZolin (ANCEF) IV    . lactated ringers       Radiological Exams on Admission: DG C-Arm 1-60 Min-No Report  Result Date: 02/26/2020 Fluoroscopy was utilized by the requesting physician.  No radiographic interpretation.    Impression/Recommendations Principal Problem:   Nephrolithiasis This morning she had the following procedure(s) Performed: 1. Left percutaneous nephrostolithotomy for stone burden greater than 2 cm 2. Antegrade nephrostogram with nephrostomy tube placement 3. Antegrade left ureteral stent placement 4. Intraoperative fluoroscopy with interpretation less than 1 hour. Postop management per urology.  Active Problems:   Type 2 diabetes mellitus (HCC) Carbohydrate modified diet. Resume Lantus 15 units SQ every morning. CBG monitoring with RI SS AC and QHS.    Essential hypertension Continue amlodipine 5 mg p.o. daily. Continue hydralazine 100 mg p.o. every 8 hours. Also on torsemide, carvedilol and Imdur.    Paroxysmal atrial fibrillation (HCC) CHA?DS?-VASc Score of at least 4. Continue apixaban 5 mg p.o. twice daily. Continue carvedilol 25 mg p.o. twice daily.    Chronic kidney disease (CKD), stage IV (severe) (HCC) Monitor renal function electrolytes.    Non-ischemic cardiomyopathy ([place]) No signs of decompensation at this time. Continue carvedilol, Imdur, hydralazine and torsemide.    Normocytic anemia Monitor H&H. Transfuse as needed.  Thank you for this consultation.  Our [facility] hospitalist team will follow the patient with  you.  Time Spent: About 55 minutes were spent during the process of this consult.  Reubin Milan M.D. [facility] 02/26/2020, 3:06 PM

## 2020-02-26 NOTE — Anesthesia Postprocedure Evaluation (Signed)
Anesthesia Post Note  Patient: Adrienne Lane  Procedure(s) Performed: NEPHROLITHOTOMY PERCUTANEOUS (Left )     Patient location during evaluation: PACU Anesthesia Type: General Level of consciousness: awake Pain management: pain level controlled Vital Signs Assessment: post-procedure vital signs reviewed and stable Respiratory status: spontaneous breathing, nonlabored ventilation, respiratory function stable and patient connected to nasal cannula oxygen Cardiovascular status: blood pressure returned to baseline and stable Postop Assessment: no apparent nausea or vomiting Anesthetic complications: no    Last Vitals:  Vitals:   02/26/20 1345 02/26/20 1415  BP:  135/70  Pulse:  79  Resp:  13  Temp: 36.7 C   SpO2:  99%    Last Pain:  Vitals:   02/26/20 1415  TempSrc:   PainSc: 4                  Jorita Bohanon P Kylieann Eagles

## 2020-02-26 NOTE — Transfer of Care (Signed)
Immediate Anesthesia Transfer of Care Note  Patient: Adrienne Lane  Procedure(s) Performed: NEPHROLITHOTOMY PERCUTANEOUS (Left )  Patient Location: PACU  Anesthesia Type:General  Level of Consciousness: drowsy, patient cooperative and responds to stimulation  Airway & Oxygen Therapy: Patient Spontanous Breathing and Patient connected to face mask oxygen  Post-op Assessment: Report given to RN and Post -op Vital signs reviewed and stable  Post vital signs: Reviewed and stable  Last Vitals:  Vitals Value Taken Time  BP 135/78 02/26/20 1039  Temp    Pulse 62 02/26/20 1044  Resp 13 02/26/20 1044  SpO2 100 % 02/26/20 1044  Vitals shown include unvalidated device data.  Last Pain:  Vitals:   02/26/20 0557  TempSrc: Oral         Complications: No apparent anesthesia complications

## 2020-02-26 NOTE — Op Note (Signed)
Preoperative Diagnosis:   Left renal stone greater than 2 cm   Postoperative Diagnosis:   Left renal stone greater than 2 cm   Procedure(s) Performed:   1. Left percutaneous nephrostolithotomy for stone burden greater than 2 cm 2. Antegrade nephrostogram with nephrostomy tube placement 3. Antegrade left ureteral stent placement 4. Intraoperative fluoroscopy with interpretation less than 1 hour   Teaching Surgeon:  Aleen Campi, M.D.   Resident Surgeon:  Bethel Born Chenille Toor,M.D.   Anesthesia:  General via endotracheal tube.     IV Fluids:  See Anesthesia record.   Estimated Blood Loss:  25 cc.   Cultures:  None   Drains:  Left 14 Fr nephrostomy tube, 14 French Foley catheter, both to drainage. 6 Fr x 24 cm JJ ureteral stent   Specimens: None   Complications:  None.   Indications for Surgery:  Adrienne Lane is a 54 y.o. female with a history of nephrolithiasis.  The patient was evaluated and noted with bilateral staghorn renal calculi.  She had initial left PCNL on 02/14/2020 with known residual stone. burden after procedure; she had repeat left PCNL on 02/21/2020 with continued residual stone burden. The patient presents today for repeat  percutaneous treatment of Left kidney stone. The risks and benefits of the procedure were discussed with the patient who wished to proceed.   Operative Findings:    Treatment of all significantly sized left renal stones via established nephrostomy tract. Successful placement of nephrostomy tube and ureteral stent in antegrade fashion.   Radiologic Interpretation of Anterograde Nephrostogram: Antegrade nephrostogram demonstrated multiple filling defects consistent with staghorn calculi.  Fluoroscopy at end of case demonstrated no significantly sized residual stone burden.   Procedure: The patient was correctly identified in the preoperative holding area where written informed consent as well as potential risks and complications were reviewed. The  patient was brought back to the operative suite. Once correct information was verified, general anesthesia was induced via endotracheal tube.  The patient was then gently repositioned into the prone position, paying careful attention to pad all pressure points and affix the patient to the bed at multiple points of contact.  She was then prepped and draped in the usual sterile fashion and given appropriate perioperative procedural antibiotics.  Sequential compression devices were placed for VTE prophylaxis. A 14 Fr foley catheter was placed per urethra.  A timeout was then performed.   We started by performing antegrade nephrostogram through prior nephrostomy tube.  This demonstrated the above-noted findings and the nephrostomy tube was confirmed to be in excellent position.  We then advanced a sensor wire through the nephrostomy tube tract and removed the nephrostomy tube leaving the wire in place.  Next, we utilized a Kumpe catheter in order to help direct the sensor wire down the ureter and it was advanced successfully down to the level of the bladder.  With the assistance of a dual-lumen catheter, we placed a stiff wire alongside the sensor wire down the level of the bladder.  We removed the dual-lumen catheter leaving the 2 wires in place.  Next, we re-developed our percutaneous tract with the advancement of a 30 French x 20 cm Nephromax balloon dilator over the stiff wire.  After this, a 47 French sheath was advanced to the edge of the upper pole calyx on fluoroscopy.     We then performed rigid nephroscopy with the lithotrite. Upon entrance into the collecting system, we encountered stone and we then proceeded to treat the stone with lithotripsy.  After reaching the limit of the stone capable of being treated with the rigid scope, we obtained a flexible scope, and identified additional stone burden in the lower pole which was successfully treated with a laser lithotripsy through the flexible scope.  We  treated the stone until there are no significantly sized stones left in the kidney.  Tiny residual fragments of stone remained, and these fragments are likely to pass via ureter.  We then placed a 6 [place] by 24 cm JJ ureteral stent in antegrade fashion over the sensor wire.  The sensor wire was removed, leaving the catheter in place.  Fluoroscopy confirmed appropriate placement with proximal curl within the kidney and distal curl within bladder.  Next, we passed a 31 French nephrostomy tube over the stiff wire to the patient's renal pelvis without difficulty.  We remove the wire and locked the nephrostomy tube into place. The nephrostomy tube was affixed to the patient's skin using a single suture in an interrupted fashion. We administered 20 cc of 1% Lidocaine solution with epinephrine locally around the nephrostomy site, and placed the nephrostomy tube to drainage. At this point, the patient was extubated and taken to the recovery area in stable fashion.

## 2020-02-26 NOTE — H&P (View-Only) (Signed)
Urology Problem List  - Bilateral Staghorn stones  - Microhematuria   Interval history since clinic visit:  S/p initial left PCNL on 02/14/2020, repeat PCNL on 02/21/2020  Presents today for third look left PCNL.  Clinic visit: 01/10/20  54 year old unhealthy female with obesity, diabetes on insulin, heart failure (Ef 25% in Nov 2020), paroxysmal A fib on Eliquis uterine fibroids, CKD, and bilateral staghorn stones.   Stones were diagnosed in 2019 but did not follow-up with urology. She was hospitalized in November 2020 with heart failure and acute on chronic CKD. During that admission a CT scan was performed which again identified large bilateral renal stones without hydronephrosis. Seen by urology on 10/24/2019 ([facility]). Decision was made to hold on intervention due to no hydronephrosis.   Since discharge she has adressed several of her medical issues   CARDIOLOGY:  - seen in heart failure clinic on 12/03/2019. Dr. [name]. [phone]. [facility] heart failure [facility]. Seen again by cardiology on 12/31/2019. Plan was to continue Eliquis and be evaluated by a EP cardiologist for persistent A. fib. Although she has a low EF she denies any history of MI, stent, aspirin, Plavix. Next appointment on 02/05/20   GYN:  - She was seen by gynecology with plan to reevaluate her large uterine fibroid in June 2021. She is on no medications for this.   NEPHROLOGY:  She was seen by nephrology on 12/05/2019. No acute interventions from this visit. Last creatinine 12/31/2019, 1.82.    UROLOGY:  From a urologic perspective she has had no prior episodes of nephrolithiasis. Did have hematuria back when she was living in [place] in 2019 but never had imaging at the time. Currently denies flank pain. No visible hematuria but does have microscopic hematuria on UA today. Denies fevers, chills, suprapubic pain. Due to lack of symptoms will not send urine for culture.   Did discuss that  she has microhematuria, likely from stones, and that we would plan on a cystoscopy during her stone procedure   Today we reviewed her imaging and I drew a picture to help her conceptualize her stone burden. I explained a PCNL in detail and in her specific circumstances stated we would likely need access from 2 points in the kidney and she may need multiple procedures on each side to clear her stones.   She was very knowledgeable of her medical history and participated in the conversation, voicing understanding of the plan and rationale. States she will likely receive disability within 90 days and is actively working on that process.   No prior abdominal surgeries. Does have a ventral wall hernia which is mostly asymptomatic    ALLERGIES: None   MEDICATIONS: Apresoline  Coreg  Dermadex  Eliquis  Imdur  Lantus  Mexitil    GU PSH: None   NON-GU PSH: Remove Gallbladder    GU PMH: None   NON-GU PMH: Atrial Fibrillation Diabetes Type 2 Heart disease, unspecified Hypertension   FAMILY HISTORY: Congestive Heart Failure - Mother Diabetes - Mother, Brother Heart Attack - Mother Hypertension - Brother, Mother stroke - Mother   SOCIAL HISTORY: Marital Status: Married Preferred Language: English; Ethnicity: Not Hispanic Or Latino; Race: Black or African American Current Smoking Status: Patient has never smoked.   Tobacco Use Assessment Completed: Used Tobacco in last 30 days? Has never drank.  Drinks 1 caffeinated drink per day. Patient's occupation Customer service manager.   REVIEW OF SYSTEMS:  GU Review Female: Patient reports frequent urination, get up at  night to urinate, and leakage of urine. Patient denies hard to postpone urination, burning /pain with urination, stream starts and stops, trouble starting your stream, have to strain to urinate, and being pregnant.  Gastrointestinal (Upper): Patient denies nausea, vomiting, and indigestion/  heartburn.  Gastrointestinal (Lower): Patient denies diarrhea and constipation.  Constitutional: Patient reports fatigue. Patient denies fever, night sweats, and weight loss.  Skin: Patient denies skin rash/ lesion and itching.  Eyes: Patient denies blurred vision and double vision.  Ears/ Nose/ Throat: Patient denies sore throat and sinus problems.  Hematologic/Lymphatic: Patient denies swollen glands and easy bruising.  Cardiovascular: Patient denies leg swelling and chest pains.  Respiratory: Patient denies cough and shortness of breath.  Endocrine: Patient reports excessive thirst.  Musculoskeletal: Patient denies back pain and joint pain.  Neurological: Patient denies headaches and dizziness.  Psychologic: Patient denies depression and anxiety.   Notes: Hematuria, Hx of UTI   VITAL SIGNS:  01/10/2020 02:42 PM  Weight 230 lb / 104.33 kg  Height 68 in / 172.72 cm  BP 173/76 mmHg  Pulse 39 /min  Temperature 98.0 F / 36.6 C  BMI 35.0 kg/m   MULTI-SYSTEM PHYSICAL EXAMINATION:   Notes: General Appearance: No acute distress. Alert and oriented x 3.  Pulmonary: Normal respiratory effort on room air  Cardiovascular: Regular rate  Abdomen: Soft, non-tender, no protruding hernia  Musculoskeletal: Normal gait. Extremities without edema.  GU: No CVA or SP tenderness  Neurologic: No motor abnormalities noted.    PAST DATA REVIEWED:  Source Of History: Patient  Records Review: Previous Patient Records  Urine Test Review: Urinalysis  X-Ray Review: C.T. Abdomen: Reviewed Films.    PROCEDURES:   Urinalysis w/Scope Dipstick Dipstick Cont'd Micro  Color: Red Bilirubin: Neg mg/dL WBC/hpf: >60/hpf  Appearance: Turbid Ketones: Neg mg/dL RBC/hpf: >60/hpf  Specific Gravity: 1.020 Blood: 3+ ery/uL Bacteria: Rare (0-9/hpf)  pH: 7.0 Protein: 3+ mg/dL Cystals: NS (Not Seen)  Glucose: Neg mg/dL Urobilinogen: 0.2 mg/dL Casts: NS (Not  Seen)  Nitrites: Neg Trichomonas: Not Present  Leukocyte Esterase: 3+ leu/uL Mucous: Not Present  Epithelial Cells: 0 - 5/hpf  Yeast: NS (Not Seen)  Sperm: Not Present    Notes: Microscopic not concentrated.   ASSESSMENT:  ICD-10 Details  1 GU: Renal calculus - N20.0  2 Microscopic hematuria - R31.21   PLAN:   Schedule Return Visit/Planned Activity: 1 Month - Office Visit  Return Notes: Return on 02/07/20 for resident clinic    Document Letter(s): Created for Patient: Clinical Summary    Notes: 54 year old unhealthy female with CHF/ EF 20%, A. fib on Eliquis, and large bilateral staghorn stones.   We had an in-depth discussion regarding PCNL. She is on board with the general surgical plan. Discussed risks of PCNL which include bleeding, infection, damage to the kidney, residual stone burden.   She needs PCNL soon, but it is not technically an emergency. Due to COVID, pending insurance, and ongoing cardiac workup I think it is best to aim for late February.   Plan:   Microhematuria: likely due to bilateral stones  -Evaluate with cystoscopy during stone procedure   Bilateral stones: Discussed case with Dr. Louis Meckel. Plan for staged PCNL procedure.  - Start with the left side with urology to obtain access from likely 2 sites. Admit overnight and leave council tip nephrostomy tube in place. Likely discharge the next day and bring back for second look PCNL 1 week later. After she has recovered from left side, plan for a  PCNL on the right, again getting our own access and proactively scheduling 2 procedures.   Preoperative checklist:  -- Will submit green posting sheet to scheduler. PCNL x2 with 1 week between  - Needs preoperative anesthesia clearance, requested on green sheet  - CBC, BMP, Urine at Visit on 02/07/20  - Cardiac clearance requested on posting sheet. We will see her in clinic after her cardiac visit on  02/05/20  - NO VIR access needed  - Can double check insurance status at time of next visit

## 2020-02-26 NOTE — Anesthesia Procedure Notes (Signed)
Procedure Name: Intubation Date/Time: 02/26/2020 8:01 AM Performed by: Glory Buff, CRNA Pre-anesthesia Checklist: Patient identified, Emergency Drugs available, Suction available and Patient being monitored Patient Re-evaluated:Patient Re-evaluated prior to induction Oxygen Delivery Method: Circle system utilized Preoxygenation: Pre-oxygenation with 100% oxygen Induction Type: IV induction Ventilation: Mask ventilation without difficulty Laryngoscope Size: Miller and 3 Grade View: Grade I Tube type: Oral Number of attempts: 1 Airway Equipment and Method: Stylet and Oral airway Placement Confirmation: ETT inserted through vocal cords under direct vision,  positive ETCO2 and breath sounds checked- equal and bilateral Secured at: 22 cm Tube secured with: Tape Dental Injury: Teeth and Oropharynx as per pre-operative assessment

## 2020-02-26 NOTE — H&P (Signed)
Urology Problem List  - Bilateral Staghorn stones  - Microhematuria   Interval history since clinic visit:  S/p initial left PCNL on 02/14/2020, repeat PCNL on 02/21/2020  Presents today for third look left PCNL.  Clinic visit: 01/10/20  54 year old unhealthy female with obesity, diabetes on insulin, heart failure (Ef 25% in Nov 2020), paroxysmal A fib on Eliquis uterine fibroids, CKD, and bilateral staghorn stones.   Stones were diagnosed in 2019 but did not follow-up with urology. She was hospitalized in November 2020 with heart failure and acute on chronic CKD. During that admission a CT scan was performed which again identified large bilateral renal stones without hydronephrosis. Seen by urology on 10/24/2019 ([facility]). Decision was made to hold on intervention due to no hydronephrosis.   Since discharge she has adressed several of her medical issues   CARDIOLOGY:  - seen in heart failure clinic on 12/03/2019. Dr. [name]. [phone]. [facility] heart failure [facility]. Seen again by cardiology on 12/31/2019. Plan was to continue Eliquis and be evaluated by a EP cardiologist for persistent A. fib. Although she has a low EF she denies any history of MI, stent, aspirin, Plavix. Next appointment on 02/05/20   GYN:  - She was seen by gynecology with plan to reevaluate her large uterine fibroid in June 2021. She is on no medications for this.   NEPHROLOGY:  She was seen by nephrology on 12/05/2019. No acute interventions from this visit. Last creatinine 12/31/2019, 1.82.    UROLOGY:  From a urologic perspective she has had no prior episodes of nephrolithiasis. Did have hematuria back when she was living in [place] in 2019 but never had imaging at the time. Currently denies flank pain. No visible hematuria but does have microscopic hematuria on UA today. Denies fevers, chills, suprapubic pain. Due to lack of symptoms will not send urine for culture.   Did discuss that  she has microhematuria, likely from stones, and that we would plan on a cystoscopy during her stone procedure   Today we reviewed her imaging and I drew a picture to help her conceptualize her stone burden. I explained a PCNL in detail and in her specific circumstances stated we would likely need access from 2 points in the kidney and she may need multiple procedures on each side to clear her stones.   She was very knowledgeable of her medical history and participated in the conversation, voicing understanding of the plan and rationale. States she will likely receive disability within 90 days and is actively working on that process.   No prior abdominal surgeries. Does have a ventral wall hernia which is mostly asymptomatic    ALLERGIES: None   MEDICATIONS: Apresoline  Coreg  Dermadex  Eliquis  Imdur  Lantus  Mexitil    GU PSH: None   NON-GU PSH: Remove Gallbladder    GU PMH: None   NON-GU PMH: Atrial Fibrillation Diabetes Type 2 Heart disease, unspecified Hypertension   FAMILY HISTORY: Congestive Heart Failure - Mother Diabetes - Mother, Brother Heart Attack - Mother Hypertension - Brother, Mother stroke - Mother   SOCIAL HISTORY: Marital Status: Married Preferred Language: English; Ethnicity: Not Hispanic Or Latino; Race: Black or African American Current Smoking Status: Patient has never smoked.   Tobacco Use Assessment Completed: Used Tobacco in last 30 days? Has never drank.  Drinks 1 caffeinated drink per day. Patient's occupation Customer service manager.   REVIEW OF SYSTEMS:  GU Review Female: Patient reports frequent urination, get up at  night to urinate, and leakage of urine. Patient denies hard to postpone urination, burning /pain with urination, stream starts and stops, trouble starting your stream, have to strain to urinate, and being pregnant.  Gastrointestinal (Upper): Patient denies nausea, vomiting, and indigestion/  heartburn.  Gastrointestinal (Lower): Patient denies diarrhea and constipation.  Constitutional: Patient reports fatigue. Patient denies fever, night sweats, and weight loss.  Skin: Patient denies skin rash/ lesion and itching.  Eyes: Patient denies blurred vision and double vision.  Ears/ Nose/ Throat: Patient denies sore throat and sinus problems.  Hematologic/Lymphatic: Patient denies swollen glands and easy bruising.  Cardiovascular: Patient denies leg swelling and chest pains.  Respiratory: Patient denies cough and shortness of breath.  Endocrine: Patient reports excessive thirst.  Musculoskeletal: Patient denies back pain and joint pain.  Neurological: Patient denies headaches and dizziness.  Psychologic: Patient denies depression and anxiety.   Notes: Hematuria, Hx of UTI   VITAL SIGNS:  01/10/2020 02:42 PM  Weight 230 lb / 104.33 kg  Height 68 in / 172.72 cm  BP 173/76 mmHg  Pulse 39 /min  Temperature 98.0 F / 36.6 C  BMI 35.0 kg/m   MULTI-SYSTEM PHYSICAL EXAMINATION:   Notes: General Appearance: No acute distress. Alert and oriented x 3.  Pulmonary: Normal respiratory effort on room air  Cardiovascular: Regular rate  Abdomen: Soft, non-tender, no protruding hernia  Musculoskeletal: Normal gait. Extremities without edema.  GU: No CVA or SP tenderness  Neurologic: No motor abnormalities noted.    PAST DATA REVIEWED:  Source Of History: Patient  Records Review: Previous Patient Records  Urine Test Review: Urinalysis  X-Ray Review: C.T. Abdomen: Reviewed Films.    PROCEDURES:   Urinalysis w/Scope Dipstick Dipstick Cont'd Micro  Color: Red Bilirubin: Neg mg/dL WBC/hpf: >60/hpf  Appearance: Turbid Ketones: Neg mg/dL RBC/hpf: >60/hpf  Specific Gravity: 1.020 Blood: 3+ ery/uL Bacteria: Rare (0-9/hpf)  pH: 7.0 Protein: 3+ mg/dL Cystals: NS (Not Seen)  Glucose: Neg mg/dL Urobilinogen: 0.2 mg/dL Casts: NS (Not  Seen)  Nitrites: Neg Trichomonas: Not Present  Leukocyte Esterase: 3+ leu/uL Mucous: Not Present  Epithelial Cells: 0 - 5/hpf  Yeast: NS (Not Seen)  Sperm: Not Present    Notes: Microscopic not concentrated.   ASSESSMENT:  ICD-10 Details  1 GU: Renal calculus - N20.0  2 Microscopic hematuria - R31.21   PLAN:   Schedule Return Visit/Planned Activity: 1 Month - Office Visit  Return Notes: Return on 02/07/20 for resident clinic    Document Letter(s): Created for Patient: Clinical Summary    Notes: 54 year old unhealthy female with CHF/ EF 20%, A. fib on Eliquis, and large bilateral staghorn stones.   We had an in-depth discussion regarding PCNL. She is on board with the general surgical plan. Discussed risks of PCNL which include bleeding, infection, damage to the kidney, residual stone burden.   She needs PCNL soon, but it is not technically an emergency. Due to COVID, pending insurance, and ongoing cardiac workup I think it is best to aim for late February.   Plan:   Microhematuria: likely due to bilateral stones  -Evaluate with cystoscopy during stone procedure   Bilateral stones: Discussed case with Dr. Louis Meckel. Plan for staged PCNL procedure.  - Start with the left side with urology to obtain access from likely 2 sites. Admit overnight and leave council tip nephrostomy tube in place. Likely discharge the next day and bring back for second look PCNL 1 week later. After she has recovered from left side, plan for a  PCNL on the right, again getting our own access and proactively scheduling 2 procedures.   Preoperative checklist:  -- Will submit green posting sheet to scheduler. PCNL x2 with 1 week between  - Needs preoperative anesthesia clearance, requested on green sheet  - CBC, BMP, Urine at Visit on 02/07/20  - Cardiac clearance requested on posting sheet. We will see her in clinic after her cardiac visit on  02/05/20  - NO VIR access needed  - Can double check insurance status at time of next visit

## 2020-02-27 DIAGNOSIS — Z794 Long term (current) use of insulin: Secondary | ICD-10-CM

## 2020-02-27 DIAGNOSIS — I428 Other cardiomyopathies: Secondary | ICD-10-CM

## 2020-02-27 DIAGNOSIS — D649 Anemia, unspecified: Secondary | ICD-10-CM

## 2020-02-27 DIAGNOSIS — N184 Chronic kidney disease, stage 4 (severe): Secondary | ICD-10-CM

## 2020-02-27 DIAGNOSIS — E1165 Type 2 diabetes mellitus with hyperglycemia: Secondary | ICD-10-CM

## 2020-02-27 DIAGNOSIS — I480 Paroxysmal atrial fibrillation: Secondary | ICD-10-CM

## 2020-02-27 DIAGNOSIS — N200 Calculus of kidney: Principal | ICD-10-CM

## 2020-02-27 DIAGNOSIS — I10 Essential (primary) hypertension: Secondary | ICD-10-CM

## 2020-02-27 LAB — BPAM RBC
Blood Product Expiration Date: 202104042359
ISSUE DATE / TIME: 202103091012
Unit Type and Rh: 5100

## 2020-02-27 LAB — BASIC METABOLIC PANEL
Anion gap: 10 (ref 5–15)
BUN: 43 mg/dL — ABNORMAL HIGH (ref 6–20)
CO2: 22 mmol/L (ref 22–32)
Calcium: 8.8 mg/dL — ABNORMAL LOW (ref 8.9–10.3)
Chloride: 106 mmol/L (ref 98–111)
Creatinine, Ser: 2.47 mg/dL — ABNORMAL HIGH (ref 0.44–1.00)
GFR calc Af Amer: 25 mL/min — ABNORMAL LOW (ref >60)
GFR calc non Af Amer: 22 mL/min — ABNORMAL LOW (ref >60)
Glucose, Bld: 209 mg/dL — ABNORMAL HIGH (ref 70–99)
Potassium: 4 mmol/L (ref 3.5–5.1)
Sodium: 138 mmol/L (ref 135–145)

## 2020-02-27 LAB — TYPE AND SCREEN
ABO/RH(D): O POS
Antibody Screen: NEGATIVE
Unit division: 0

## 2020-02-27 LAB — HEMOGLOBIN AND HEMATOCRIT, BLOOD
HCT: 27.1 % — ABNORMAL LOW (ref 36.0–46.0)
Hemoglobin: 8.7 g/dL — ABNORMAL LOW (ref 12.0–15.0)

## 2020-02-27 LAB — GLUCOSE, CAPILLARY
Glucose-Capillary: 202 mg/dL — ABNORMAL HIGH (ref 70–99)
Glucose-Capillary: 202 mg/dL — ABNORMAL HIGH (ref 70–99)
Glucose-Capillary: 178 mg/dL — ABNORMAL HIGH (ref 70–99)

## 2020-02-27 MED ORDER — TAMSULOSIN HCL 0.4 MG PO CAPS: 0.4000 mg | ORAL_CAPSULE | Freq: Every day | ORAL | 2 refills | Status: DC

## 2020-02-27 MED ORDER — PRO-STAT SUGAR FREE PO LIQD: 30.0000 mL | Freq: Two times a day (BID) | ORAL | Status: DC

## 2020-02-27 MED ORDER — INSULIN ASPART 100 UNIT/ML ~~LOC~~ SOLN
3.0000 [IU] | Freq: Three times a day (TID) | SUBCUTANEOUS | Status: DC
  Administered 2020-02-27: 3 [IU] via SUBCUTANEOUS

## 2020-02-27 MED ORDER — ADULT MULTIVITAMIN W/MINERALS CH: 1.0000 | ORAL_TABLET | Freq: Every day | ORAL | Status: DC

## 2020-02-27 MED FILL — TAMSULOSIN HCL 0.4 MG CAP: 0.4 | 30 days supply | Qty: 30 | Fill #0

## 2020-02-27 NOTE — Discharge Summary (Signed)
Date of admission: 02/26/2020  Date of discharge: 02/27/2020  Admission diagnosis: Nephrolithiasis  Discharge diagnosis: Nephrolithiasis  History and Physical: For full details, please see admission history and physical. Briefly, Adrienne Lane is a 54 y.o. female with bilateral nephrolithiasis presenting for repeat left PCNL.  After discussing management/treatment options, she elected to proceed with surgical treatment.  Hospital Course: Adrienne Lane was taken to the operating room on 02/26/2020 and underwent a left PCNL. She tolerated this procedure well and without complications. Postoperatively, she was able to be transferred to a regular hospital room following recovery from anesthesia.  She was able to begin ambulating the night of surgery. She remained hemodynamically stable overnight after receiving one unit of pRBC for anemia.  She had excellent urine output and foley was removed on POD #1.  Her nephrostomy tube was capped, and she tolerated this well so nephrostomy tube was removed prior to discharge. Her pain was well controlled, she tolerated a regular diet, and had met all discharge criteria and was able to be discharged home later on POD#1. She is being discharged with a left ureteral stent in place.    Laboratory values:  Recent Labs    02/26/20 0713 02/26/20 1003 02/27/20 0539  HGB 8.2* 7.8* 8.7*  HCT 26.0* 23.0* 27.1*     Physical Exam:  General: Alert and oriented. CV: Regular rate Lungs: Normal work of breathing GI: Soft, Nondistended. Incisions: Clean, healing well.  GU: voiding spontaneously.   Disposition: Home  Discharge instruction: Reviewed with patient.   Discharge medications:   Allergies as of 02/27/2020      Reactions   Adhesive [tape]    Tears skin, can tolerate paper tape      Medication List    TAKE these medications   acetaminophen 500 MG tablet Commonly known as: TYLENOL Take 500 mg by mouth every 6 (six) hours as needed for mild pain.    amLODipine 5 MG tablet Commonly known as: NORVASC Take 1 tablet (5 mg total) by mouth daily. What changed: when to take this   amoxicillin-clavulanate 875-125 MG tablet Commonly known as: AUGMENTIN Take 1 tablet by mouth every 12 (twelve) hours for 21 days. Notes to patient: Take for 7 days and then resume 7 days prior to surgery   apixaban 5 MG Tabs tablet Commonly known as: ELIQUIS Take 1 tablet (5 mg total) by mouth 2 (two) times daily. Notes to patient: Stop taking 3-5 days prior to surgery   carvedilol 25 MG tablet Commonly known as: COREG Take 1 tablet (25 mg total) by mouth 2 (two) times daily with a meal.   ferrous sulfate 325 (65 FE) MG tablet Take 1 tablet (325 mg total) by mouth daily with breakfast.   glucose blood test strip Use as instructed   hydrALAZINE 100 MG tablet Commonly known as: APRESOLINE Take 1 tablet (100 mg total) by mouth every 8 (eight) hours.   insulin glargine 100 UNIT/ML injection Commonly known as: LANTUS Inject 15 Units into the skin every morning.   INSULIN SYRINGE .5CC/30GX1/2" 30G X 1/2" 0.5 ML Misc 12 Units by Does not apply route at bedtime.   isosorbide mononitrate 60 MG 24 hr tablet Commonly known as: IMDUR Take 1 tablet (60 mg total) by mouth daily.   mexiletine 150 MG capsule Commonly known as: MEXITIL Take 2 capsules (300 mg total) by mouth every 12 (twelve) hours.   onetouch ultrasoft lancets Check CBG twice a day   oxyCODONE 5 MG immediate release tablet Commonly  known as: Roxicodone Take 1 tablet (5 mg total) by mouth every 4 (four) hours as needed.   potassium chloride 10 MEQ tablet Commonly known as: KLOR-CON Take 4 tablets (40 mEq total) by mouth daily.   tamsulosin 0.4 MG Caps capsule Commonly known as: Flomax Take 1 capsule (0.4 mg total) by mouth daily.   torsemide 20 MG tablet Commonly known as: DEMADEX Take 2 tablets (40 mg total) by mouth 2 (two) times daily.   WOMENS MULTI PO Take 1 tablet by  mouth daily.       Followup:She will follow up for repeat procedure and stent removal; we will call her to schedule.    

## 2020-02-27 NOTE — Progress Notes (Signed)
Initial Nutrition Assessment  DOCUMENTATION CODES:   Obesity unspecified  INTERVENTION:  - will d/c Ensure Enlive per patient preference. - will order Hormel Shake with breakfast meals, each supplement provides 500 kcal and 22 grams protein. - will order 30 mL Prostat BID, each supplement provides 100 kcal and 15 grams of protein. - will order daily multivitamin with minerals.    NUTRITION DIAGNOSIS:   Increased nutrient needs related to acute illness as evidenced by estimated needs.  GOAL:   Patient will meet greater than or equal to 90% of their needs  MONITOR:   PO intake, Supplement acceptance, Labs, Weight trends  REASON FOR ASSESSMENT:   Malnutrition Screening Tool  ASSESSMENT:   54 y.o. female with medical history of afib, frequent PVCs, CHF, stage 4 CKD, type 2 DM, uterine fibroids, HTN, iron deficiency anemia, non-ischemic cardiomyopathy, obesity, peripheral edema, and urolithiasis. She underwent a L percutaneous nephrostolithotomy with antegrade nephrostogram with nephrostomy tube placement and antegrade L ureteral stent placement.  Patient reports that appetite was good PTA and that she did not have any difficulty or discomfort when eating, but that despite eating well she has been losing weight over the past 1-2 months. Per chart review, weight on 3/9 was 234 lb and weight on 12/03/19 was 238 lb. This indicates 4 lb weight loss (1.7% body weight) in the past 3 months; not significant for time frame.   Per notes: - nephrolithiasis s/p L perc nephrostolithotomy, nephrostomy tube placement and L ureteral stent placement - normocytic anemia   Labs reviewed; CBGs: 202, 202, 178 mg/dl, BUN: 43 mg/dl, creatinine: 2.47 mg/dl, Ca: 8.8 mg/dl, GFR: 25 ml/min. Medications reviewed; 100 mg colace BID, sliding scale novolog, 3 units novolog TID, 15 units lantus/day, 40 mEq Klor-Con once/day, 1 tablet senokot BID.      NUTRITION - FOCUSED PHYSICAL EXAM:  completed; no  muscle and no fat wasting.   Diet Order:   Diet Order            Diet Carb Modified Fluid consistency: Thin; Room service appropriate? Yes  Diet effective now              EDUCATION NEEDS:   No education needs have been identified at this time  Skin:  Skin Assessment: Reviewed RN Assessment  Last BM:  unknown  Height:   Ht Readings from Last 1 Encounters:  02/26/20 5\' 8"  (1.727 m)    Weight:   Wt Readings from Last 1 Encounters:  02/26/20 106.2 kg    Ideal Body Weight:  63.6 kg  BMI:  Body mass index is 35.61 kg/m.  Estimated Nutritional Needs:   Kcal:  0034-9179 kcal  Protein:  110-125 grams  Fluid:  >/= 2.3 L/day     Jarome Matin, MS, RD, LDN, CNSC Inpatient Clinical Dietitian RD pager # available in [facility]  After hours/weekend pager # available in [facility]

## 2020-02-27 NOTE — Progress Notes (Signed)
PROGRESS NOTE  Adrienne Lane RSW:[id number] DOB: [birth year] DOA: 02/26/2020 PCP: Ladell Pier, MD   LOS: 1 day   Brief narrative: As per HPI,  Adrienne Lane is an 54 y.o. female with below past medical history paroxysmal atrial fibrillation, frequent PVCs, chronic diastolic CHF, stage IV CKD, type 2 diabetes, uterine fibroids, hypertension, iron deficiency anemia, nonischemic cardiomyopathy, obesity, peripheral edema, urolithiasis who has been followed by urology due to bilateral staghorn stone and microhematuria underwent a left percutaneous nephrostolithotomy with antegrade nephrostogram with nephrostomy tube placement and antegrade left ureteral stent placement was referred to medical team for medical consultation of medical issues.  Assessment/Plan:  Principal Problem:   Nephrolithiasis Active Problems:   Type 2 diabetes mellitus (HCC)   Essential hypertension   Paroxysmal atrial fibrillation (HCC)   Chronic kidney disease (CKD), stage IV (severe) (HCC)   Non-ischemic cardiomyopathy (HCC)   Normocytic anemia    Nephrolithiasis Status post leftpercutaneous nephrostolithotomy, Antegrade nephrostogram with nephrostomy tube placement, Antegrade left ureteral stent placement.  Management as per primary team.    Type 2 diabetes mellitus with hyperglycemia.. Continue Lantus in the morning, sliding scale insulin Accu-Cheks diabetic diet.  Carbohydrate modified diet.  Patient is on Lantus at home.  Add NovoLog with meals during hospitalization.    Essential hypertension Continue amlodipine, hydralazine, torsemide, carvedilol and Imdur.  Blood pressure.  Blood pressure reasonably controlled.    Paroxysmal atrial fibrillation (HCC) Continue Coreg.  Patient is not on Eliquis at this time.  She takes Eliquis 5 mg twice a day at home.  Okay to take Eliquis if no further intervention is planned.    Chronic kidney disease stage IV.  Closely monitor renal function,  electrolytes.    Non-ischemic cardiomyopathy ([place]) Compensated at this time.  Continue carvedilol, Imdur, hydralazine and torsemide.    Normocytic anemia Monitor H&H. Transfuse as needed.  On iron supplement as well.  VTE Prophylaxis: SCDs  Code Status: Full code  Family Communication: None today  Disposition Plan:  . Patient is from home . Likely disposition to home . Barriers to discharge: As per primary team    Procedures: Status post leftpercutaneous nephrostolithotomy, Antegrade nephrostogram with nephrostomy tube placement, Antegrade left ureteral stent placement.   Antibiotics:  . Augmentin  Anti-infectives (From admission, onward)   Start     Dose/Rate Route Frequency Ordered Stop   02/27/20 1000  amoxicillin-clavulanate (AUGMENTIN) 875-125 MG per tablet 1 tablet     1 tablet Oral Every 12 hours 02/26/20 1704     02/26/20 2200  amoxicillin-clavulanate (AUGMENTIN) 875-125 MG per tablet 1 tablet  Status:  Discontinued     1 tablet Oral Every 12 hours 02/26/20 1655 02/26/20 1704   02/26/20 1730  ceFAZolin (ANCEF) IVPB 1 g/50 mL premix     1 g 100 mL/hr over 30 Minutes Intravenous Every 8 hours 02/26/20 1655 02/27/20 0251   02/26/20 0645  ceFAZolin (ANCEF) IVPB 2g/100 mL premix  Status:  Discontinued     2 g 200 mL/hr over 30 Minutes Intravenous Every 8 hours 02/26/20 0641 02/26/20 1656   02/26/20 0600  gentamicin (GARAMYCIN) 520 mg in dextrose 5 % 100 mL IVPB    Note to Pharmacy: PER PHARMACY PROTOCOL   520 mg 113 mL/hr over 60 Minutes Intravenous  Once 02/26/20 0552 02/26/20 0844       Subjective: Today, patient was seen and examined at bedside.  Patient denies overt pain.  Denies any nausea, vomiting.  No fever or chills.  Objective: Vitals:  02/27/20 0614 02/27/20 0630  BP: (!) 149/70 (!) 149/70  Pulse:  78  Resp:  18  Temp:  97.9 F (36.6 C)  SpO2:  98%    Intake/Output Summary (Last 24 hours) at 02/27/2020 1123 Last data filed at 02/27/2020  6948 Gross per 24 hour  Intake 515 ml  Output 1135 ml  Net -620 ml   Filed Weights   02/26/20 0557  Weight: 106.2 kg   Body mass index is 35.61 kg/m.   Physical Exam: GENERAL: Patient is alert awake and oriented. Not in obvious distress.  Obese HENT: No scleral pallor or icterus. Pupils equally reactive to light. Oral mucosa is moist NECK: is supple, no gross swelling noted. CHEST: Clear to auscultation. No crackles or wheezes.  Diminished breath sounds bilaterally. CVS: S1 and S2 heard, no murmur. Regular rate and rhythm.  ABDOMEN: Soft, non-tender, bowel sounds are present.  Left percutaneous nephrostomy tube in place. EXTREMITIES: No edema. CNS: Cranial nerves are intact. No focal motor deficits. SKIN: warm and dry without rashes.  Data Review: I have personally reviewed the following laboratory data and studies,  CBC: Recent Labs  Lab 02/21/20 1100 02/22/20 0415 02/26/20 0713 02/26/20 1003 02/27/20 0539  WBC 12.2* 12.2* 13.3*  --   --   HGB 9.6* 8.2* 8.2* 7.8* 8.7*  HCT 29.8* 25.9* 26.0* 23.0* 27.1*  MCV 91.1 90.9 92.5  --   --   PLT 265 250 262  --   --    Basic Metabolic Panel: Recent Labs  Lab 02/21/20 1100 02/21/20 1100 02/21/20 2151 02/22/20 0415 02/26/20 0713 02/26/20 1003 02/27/20 0539  NA 140  --   --  133* 138 139 138  K 4.3  --   --  4.7 4.3 4.1 4.0  CL 108  --   --  100 107 105 106  CO2 23  --   --  23 21*  --  22  GLUCOSE 305*   < > 495* 306* 274* 275* 209*  BUN 35*  --   --  40* 36* 32* 43*  CREATININE 2.09*  --   --  2.45* 2.46* 2.50* 2.47*  CALCIUM 8.4*  --   --  8.6* 8.7*  --  8.8*  MG  --   --   --   --  2.0  --   --    < > = values in this interval not displayed.   Liver Function Tests: No results for input(s): AST, ALT, ALKPHOS, BILITOT, PROT, ALBUMIN in the last 168 hours. No results for input(s): LIPASE, AMYLASE in the last 168 hours. No results for input(s): AMMONIA in the last 168 hours. Cardiac Enzymes: No results for  input(s): CKTOTAL, CKMB, CKMBINDEX, TROPONINI in the last 168 hours. BNP (last 3 results) Recent Labs    10/20/19 2029  BNP 1,623.1*    ProBNP (last 3 results) No results for input(s): PROBNP in the last 8760 hours.  CBG: Recent Labs  Lab 02/26/20 1120 02/26/20 1705 02/26/20 2043 02/27/20 0310 02/27/20 0747  GLUCAP 273* 352* 392* 202* 202*   Recent Results (from the past 240 hour(s))  SARS CORONAVIRUS 2 (TAT 6-24 HRS) Nasopharyngeal Nasopharyngeal Swab     Status: None   Collection Time: 02/18/20  3:35 PM   Specimen: Nasopharyngeal Swab  Result Value Ref Range Status   SARS Coronavirus 2 NEGATIVE NEGATIVE Final    Comment: (NOTE) SARS-CoV-2 target nucleic acids are NOT DETECTED. The SARS-CoV-2 RNA is generally detectable in upper  and lower respiratory specimens during the acute phase of infection. Negative results do not preclude SARS-CoV-2 infection, do not rule out co-infections with other pathogens, and should not be used as the sole basis for treatment or other patient management decisions. Negative results must be combined with clinical observations, patient history, and epidemiological information. The expected result is Negative. Fact Sheet for Patients: SugarRoll.be Fact Sheet for Healthcare Providers: https://www.woods-mathews.com/ This test is not yet approved or cleared by the [place] FDA and  has been authorized for detection and/or diagnosis of SARS-CoV-2 by FDA under an Emergency Use Authorization (EUA). This EUA will remain  in effect (meaning this test can be used) for the duration of the COVID-19 declaration under Section 56 4(b)(1) of the Act, 21 U.S.C. section 360bbb-3(b)(1), unless the authorization is terminated or revoked sooner. Performed at [facility], [place] [address] [place], [place] [address]   SARS CORONAVIRUS 2 (TAT 6-24 HRS) Nasopharyngeal Nasopharyngeal Swab     Status: None    Collection Time: 02/25/20  2:04 PM   Specimen: Nasopharyngeal Swab  Result Value Ref Range Status   SARS Coronavirus 2 NEGATIVE NEGATIVE Final    Comment: (NOTE) SARS-CoV-2 target nucleic acids are NOT DETECTED. The SARS-CoV-2 RNA is generally detectable in upper and lower respiratory specimens during the acute phase of infection. Negative results do not preclude SARS-CoV-2 infection, do not rule out co-infections with other pathogens, and should not be used as the sole basis for treatment or other patient management decisions. Negative results must be combined with clinical observations, patient history, and epidemiological information. The expected result is Negative. Fact Sheet for Patients: SugarRoll.be Fact Sheet for Healthcare Providers: https://www.woods-mathews.com/ This test is not yet approved or cleared by the [place] FDA and  has been authorized for detection and/or diagnosis of SARS-CoV-2 by FDA under an Emergency Use Authorization (EUA). This EUA will remain  in effect (meaning this test can be used) for the duration of the COVID-19 declaration under Section 56 4(b)(1) of the Act, 21 U.S.C. section 360bbb-3(b)(1), unless the authorization is terminated or revoked sooner. Performed at [facility], [place] [address] [place], [place] [address]   Urine culture     Status: Abnormal (Preliminary result)   Collection Time: 02/26/20  5:53 AM   Specimen: Urine, Clean Catch  Result Value Ref Range Status   Specimen Description   Final    URINE, CLEAN CATCH Performed at [facility], [place] [address], [place], [place] [address]    Special Requests   Final    NONE Performed at [facility], [place] [address] [place], [place] [address]    Culture (A)  Final    40,000 COLONIES/mL STAPHYLOCOCCUS EPIDERMIDIS SUSCEPTIBILITIES TO FOLLOW Performed at [facility], [place] [address],  [place],  [address]    Report Status PENDING  Incomplete     Studies: DG C-Arm 1-60 Min-No Report  Result Date: 02/26/2020 Fluoroscopy was utilized by the requesting physician.  No radiographic interpretation.      Flora Lipps, MD  [facility] 02/27/2020

## 2020-02-28 ENCOUNTER — Other Ambulatory Visit: Payer: Self-pay | Admitting: Internal Medicine

## 2020-02-28 ENCOUNTER — Telehealth: Payer: Self-pay

## 2020-02-28 LAB — URINE CULTURE: Culture: 40000 — AB

## 2020-02-28 MED ORDER — CLINDAMYCIN HCL 300 MG PO CAPS: 300.0000 mg | ORAL_CAPSULE | Freq: Three times a day (TID) | ORAL | 0 refills | Status: DC

## 2020-02-28 NOTE — Telephone Encounter (Signed)
Transition Care Management Follow-up Telephone Call  Date of discharge and from where: 02/27/2020, [facility]   How have you been since you were released from the hospital? She said that she is tired but feeling better.  Any questions or concerns? no questions/concerns at this time.   Items Reviewed:  Did the pt receive and understand the discharge instructions provided?  yes she has the instructions and she has no questions.   Medications obtained and verified?  she has all medications except the tamsulosin. She does not feel that she needs it.  No questions about meds at this time.  She said that she was instructed to hold the eliquis for 5 days and urology would then call to check on her and provide further instructions regarding resuming the medication.    Any new allergies since your discharge?   None reported   Do you have support at home? yes  Other (ie: DME, Home Health, etc) no home health ordered.  Left nephrostomy tube has been pulled. She said that her urine is starting to clear.  Yesterday it was red and today it has been more pink. No clots. No urgency or hesitancy voiding.    She has a glucometer  Functional Questionnaire: (I = Independent and D = Dependent) ADL's: independent   Follow up appointments reviewed:    PCP Hospital f/u appt confirmed? .  Appointment with Dr Juleen China  03/10/2020 @0830   [facility] f/u appt confirmed? Marland Kitchenappointmwnt with cardiology- 03/04/2020.   If their condition worsens, is the pt aware to call  their PCP or go to the ED?   yes  Was the patient provided with contact information for the PCP's office or ED?  She has the clinic phone number  Was the pt encouraged to call back with questions or concerns? yes

## 2020-02-29 ENCOUNTER — Telehealth: Payer: Self-pay

## 2020-02-29 MED FILL — CLINDAMYCIN HCL 300 MG CAPS: 300 | 7 days supply | Qty: 21 | Fill #0

## 2020-02-29 NOTE — Telephone Encounter (Signed)
Contacted pt to go over urine results. Pt states she was already prescribed Amoxicillin and she was told to continue for 5 days then stop and then start back before the procedure

## 2020-03-03 ENCOUNTER — Other Ambulatory Visit: Payer: Self-pay | Admitting: Urology

## 2020-03-04 ENCOUNTER — Other Ambulatory Visit: Payer: Self-pay

## 2020-03-04 ENCOUNTER — Encounter: Payer: Self-pay | Admitting: Internal Medicine

## 2020-03-04 ENCOUNTER — Ambulatory Visit (INDEPENDENT_AMBULATORY_CARE_PROVIDER_SITE_OTHER): Payer: Self-pay | Admitting: Internal Medicine

## 2020-03-04 VITALS — BP 122/66 | HR 65 | Ht 68.0 in | Wt 228.0 lb

## 2020-03-04 DIAGNOSIS — I493 Ventricular premature depolarization: Secondary | ICD-10-CM

## 2020-03-04 NOTE — Patient Instructions (Addendum)
Medication Instructions:  Your physician recommends that you continue on your current medications as directed. Please refer to the Current Medication list given to you today.  Labwork: None ordered.  Testing/Procedures: Your physician has recommended that you wear a holter monitor. Holter monitors are medical devices that record the heart's electrical activity. Doctors most often use these monitors to diagnose arrhythmias. Arrhythmias are problems with the speed or rhythm of the heartbeat. The monitor is a small, portable device. You can wear one while you do your normal daily activities. This is usually used to diagnose what is causing palpitations/syncope (passing out).  You will wear a heart monitor for 24 hours  Follow-Up: Your physician wants you to follow-up in: with Dr. Lovena Le based on results of your heart monitor.     Any Other Special Instructions Will Be Listed Below (If Applicable).  If you need a refill on your cardiac medications before your next appointment, please call your pharmacy.   ZIO XT- Long Term Monitor Instructions   Your physician has requested you wear your ZIO patch monitor__24 hours.   This is a single patch monitor.  [employer] supplies one patch monitor per enrollment.  Additional stickers are not available.   Please do not apply patch if you will be having a Nuclear Stress Test, Echocardiogram, Cardiac CT, MRI, or Chest Xray during the time frame you would be wearing the monitor. The patch cannot be worn during these tests.  You cannot remove and re-apply the ZIO XT patch monitor.   Your ZIO patch monitor will be sent USPS Priority mail from [facility] directly to your home address. The monitor may also be mailed to a PO BOX if home delivery is not available.   It may take 3-5 days to receive your monitor after you have been enrolled.   Once you have received you monitor, please review enclosed instructions.  Your monitor has already been registered  assigning a specific monitor serial # to you.   Applying the monitor   Shave hair from upper left chest.   Hold abrader disc by orange tab.  Rub abrader in 40 strokes over left upper chest as indicated in your monitor instructions.   Clean area with 4 enclosed alcohol pads .  Use all pads to assure are is cleaned thoroughly.  Let dry.   Apply patch as indicated in monitor instructions.  Patch will be place under collarbone on left side of chest with arrow pointing upward.   Rub patch adhesive wings for 2 minutes.Remove white label marked "1".  Remove white label marked "2".  Rub patch adhesive wings for 2 additional minutes.   While looking in a mirror, press and release button in center of patch.  A small green light will flash 3-4 times .  This will be your only indicator the monitor has been turned on.     Do not shower for the first 24 hours.  You may shower after the first 24 hours.   Press button if you feel a symptom. You will hear a small click.  Record Date, Time and Symptom in the Patient Log Book.   When you are ready to remove patch, follow instructions on last 2 pages of Patient Log Book.  Stick patch monitor onto last page of Patient Log Book.   Place Patient Log Book in [place] box.  Use locking tab on box and tape box closed securely.  The [employer] has [employer] on it.  Please  place in mailbox as soon as possible.  Your physician should have your test results approximately 7 days after the monitor has been mailed back to [facility].   Call [facility] at [phone] if you have questions regarding your ZIO XT patch monitor.  Call them immediately if you see an orange light blinking on your monitor.   If your monitor falls off in less than 4 days contact our Monitor department at [phone].  If your monitor becomes loose or falls off after 4 days call [employer] at [phone] for suggestions on securing your monitor.

## 2020-03-04 NOTE — Progress Notes (Signed)
HPI Ms. Rettinger returns today for followup of her PVC's. She is a pleasant 54 yo woman with a non-ischemic CM, chronic systolic heart failure and a fairly high PVC burden (19%). It is unclear as to whether her CM is related to her PVC's. Specifically whether her PVC's have played a role in its maintenance. She was placed on mexitil. She does not have much in the way of palpitations except when she lies down in bed at night. Her CHF symptoms are unchanged.  Allergies  Allergen Reactions  . Adhesive [Tape]     Tears skin, can tolerate paper tape     Current Outpatient Medications  Medication Sig Dispense Refill  . acetaminophen (TYLENOL) 500 MG tablet Take 500 mg by mouth every 6 (six) hours as needed for mild pain.     Marland Kitchen amLODipine (NORVASC) 5 MG tablet Take 1 tablet (5 mg total) by mouth daily. 30 tablet 6  . amoxicillin-clavulanate (AUGMENTIN) 875-125 MG tablet Take 1 tablet by mouth every 12 (twelve) hours for 21 days. 42 tablet 0  . apixaban (ELIQUIS) 5 MG TABS tablet Take 1 tablet (5 mg total) by mouth 2 (two) times daily. 60 tablet 6  . carvedilol (COREG) 25 MG tablet Take 1 tablet (25 mg total) by mouth 2 (two) times daily with a meal. 180 tablet 3  . clindamycin (CLEOCIN) 300 MG capsule Take 1 capsule (300 mg total) by mouth 3 (three) times daily. 21 capsule 0  . ferrous sulfate 325 (65 FE) MG tablet Take 1 tablet (325 mg total) by mouth daily with breakfast. 30 tablet 3  . glucose blood test strip Use as instructed 100 each 12  . hydrALAZINE (APRESOLINE) 100 MG tablet Take 1 tablet (100 mg total) by mouth every 8 (eight) hours. 90 tablet 6  . insulin glargine (LANTUS) 100 UNIT/ML injection Inject 15 Units into the skin every morning.     . Insulin Syringe-Needle U-100 (INSULIN SYRINGE .5CC/30GX1/2") 30G X 1/2" 0.5 ML MISC 12 Units by Does not apply route at bedtime. 100 each 6  . isosorbide mononitrate (IMDUR) 60 MG 24 hr tablet Take 1 tablet (60 mg total) by mouth daily. 30  tablet 6  . Lancets (ONETOUCH ULTRASOFT) lancets Check CBG twice a day 60 each 5  . mexiletine (MEXITIL) 150 MG capsule Take 2 capsules (300 mg total) by mouth every 12 (twelve) hours. 120 capsule 6  . Multiple Vitamins-Minerals (WOMENS MULTI PO) Take 1 tablet by mouth daily.    Marland Kitchen oxyCODONE (ROXICODONE) 5 MG immediate release tablet Take 1 tablet (5 mg total) by mouth every 4 (four) hours as needed. 15 tablet 0  . potassium chloride (KLOR-CON) 10 MEQ tablet Take 4 tablets (40 mEq total) by mouth daily. 120 tablet 6  . torsemide (DEMADEX) 20 MG tablet Take 2 tablets (40 mg total) by mouth 2 (two) times daily. 120 tablet 6   No current facility-administered medications for this visit.     Past Medical History:  Diagnosis Date  . Atrial fibrillation with RVR ([place])   . CHF (congestive heart failure) ([place])   . CKD (chronic kidney disease), stage III   . Diabetes mellitus without complication ([name])   . DOE (dyspnea on exertion)    walking upstairs or up hill resolves in one minute  . Fibroids   . History of kidney stones   . Hypertension   . Iron deficiency anemia   . Non-ischemic cardiomyopathy (HCC)    tachycardia induced  .  Obese   . Peripheral edema   . Premature ventricular contractions (PVCs) ([facility])   . Umbilical hernia   . Umbilical hernia   . Wears glasses     ROS:   All systems reviewed and negative except as noted in the HPI.   Past Surgical History:  Procedure Laterality Date  . CHOLECYSTECTOMY    . IR FLUORO GUIDE CV LINE RIGHT  02/21/2020  . IR US GUIDE VASC ACCESS RIGHT  02/21/2020  . NEPHROLITHOTOMY Left 02/14/2020   Procedure: NEPHROLITHOTOMY PERCUTANEOUS/ SURGEON ACCESS/ LEFT PERCUTANEOUS NEPHROSTOMY TUBE PLACEMENT;  Surgeon: Ardis Hughs, MD;  Location: [facility];  Service: Urology;  Laterality: Left;  . NEPHROLITHOTOMY Left 02/21/2020   Procedure: NEPHROLITHOTOMY PERCUTANEOUS SECOND LOOK;  Surgeon: Ardis Hughs, MD;  Location: [facility];  Service: Urology;   Laterality: Left;  . NEPHROLITHOTOMY Left 02/26/2020   Procedure: NEPHROLITHOTOMY PERCUTANEOUS;  Surgeon: Ceasar Mons, MD;  Location: [facility];  Service: Urology;  Laterality: Left;  NEED 150 MIN  . RIGHT HEART CATH N/A 11/09/2019   Procedure: RIGHT HEART CATH;  Surgeon: Jolaine Artist, MD;  Location: [facility];  Service: Cardiovascular;  Laterality: N/A;  . WISDOM TOOTH EXTRACTION       Family History  Problem Relation Age of Onset  . Atrial fibrillation Mother      Social History   Socioeconomic History  . Marital status: Married    Spouse name: Not on file  . Number of children: Not on file  . Years of education: Not on file  . Highest education level: Not on file  Occupational History  . Not on file  Tobacco Use  . Smoking status: Never Smoker  . Smokeless tobacco: Never Used  Substance and Sexual Activity  . Alcohol use: Never  . Drug use: Never  . Sexual activity: Not on file  Other Topics Concern  . Not on file  Social History Narrative  . Not on file   Social Determinants of Health   Financial Resource Strain:   . Difficulty of Paying Living Expenses:   Food Insecurity:   . Worried About Charity fundraiser in the Last Year:   . Arboriculturist in the Last Year:   Transportation Needs:   . Film/video editor (Medical):   Marland Kitchen Lack of Transportation (Non-Medical):   Physical Activity:   . Days of Exercise per Week:   . Minutes of Exercise per Session:   Stress:   . Feeling of Stress :   Social Connections:   . Frequency of Communication with Friends and Family:   . Frequency of Social Gatherings with Friends and Family:   . Attends Religious Services:   . Active Member of Clubs or Organizations:   . Attends Archivist Meetings:   Marland Kitchen Marital Status:   Intimate Partner Violence:   . Fear of Current or Ex-Partner:   . Emotionally Abused:   Marland Kitchen Physically Abused:   . Sexually Abused:      BP 122/66   Pulse 65    Ht 5\' 8"  (1.727 m)   Wt 228 lb (103.4 kg)   SpO2 97%   BMI 34.67 kg/m   Physical Exam:  Well appearing 54 yo woman, NAD HEENT: Unremarkable Neck:  6 cm JVD, no thyromegally Lymphatics:  No adenopathy Back:  No CVA tenderness Lungs:  Clear with no wheezes HEART:  IRegular rate rhythm, no murmurs, no rubs, no clicks Abd:  soft, positive  bowel sounds, no organomegally, no rebound, no guarding Ext:  2 plus pulses, no edema, no cyanosis, no clubbing Skin:  No rashes no nodules Neuro:  CN II through XII intact, motor grossly intact  EKG - nsr with no PVC's  Assess/Plan: 1. PVC's - it is difficult to know whether she has gotten better or not since I last saw her. On her ECG today she had no PVC's. On exam she did. I am concerned that an EP study might result in a paucity of PVC' s making mapping and ablation much more difficult. Even though it has only been 3 months, I would like to see if there is a significant change of the PVC's on mexitil. If not then we would proceed with ablation but if she is now down to 12% or less I do not think ablation would be of much value. It would certainly be unlikely to improve her LV function. However, if she is over 20%, then proceeding with ablation would be more likely to help to improve her EF and make her CHF symptoms improve as well. I have reviewed this with the patient and we will make a decision regarding ablation based on her PVC results.  2. Chronic systolic heart failure - her symptoms are class 2B/3A. She will continue guideline directed medical therapy.  3. Stage 3 renal failure - her creatining is in the mid/low 2's and stable.  4. Nephrolithiasis - she is s/p nephrolithiasis treatment with lithotripsy.  Mikle Bosworth.D.

## 2020-03-05 ENCOUNTER — Telehealth: Payer: Self-pay | Admitting: *Deleted

## 2020-03-05 NOTE — Telephone Encounter (Signed)
Dr. Lovena Le had requested a 24 hour holter to evaluate PVC's.  Patient is not insured at this time.  Patient was given information regaridng Financial assistance program with [employer].   She did not want [place] to enroll her at this time.  She will contact our office, if and when she decides to be enrolled for a self pay 3 day ZIO XT long term holter monitor to be shipped to her home.

## 2020-03-06 ENCOUNTER — Telehealth: Payer: Self-pay

## 2020-03-06 NOTE — Telephone Encounter (Signed)
Left message for Pt per her request.  Pt has medicaid pending, had asked this nurse to check back in with her.  Left VM advising Pt to call when she gets medicaid so that we can move forward with her heart monitor.

## 2020-03-06 NOTE — Telephone Encounter (Signed)

## 2020-03-10 ENCOUNTER — Ambulatory Visit (INDEPENDENT_AMBULATORY_CARE_PROVIDER_SITE_OTHER): Payer: Self-pay | Admitting: Internal Medicine

## 2020-03-10 ENCOUNTER — Other Ambulatory Visit: Payer: Self-pay

## 2020-03-10 ENCOUNTER — Encounter: Payer: Self-pay | Admitting: Internal Medicine

## 2020-03-10 VITALS — BP 154/94 | HR 85 | Temp 97.2°F | Resp 17 | Wt 223.0 lb

## 2020-03-10 DIAGNOSIS — I10 Essential (primary) hypertension: Secondary | ICD-10-CM

## 2020-03-10 DIAGNOSIS — Z09 Encounter for follow-up examination after completed treatment for conditions other than malignant neoplasm: Secondary | ICD-10-CM

## 2020-03-10 DIAGNOSIS — Z1231 Encounter for screening mammogram for malignant neoplasm of breast: Secondary | ICD-10-CM

## 2020-03-10 DIAGNOSIS — E1165 Type 2 diabetes mellitus with hyperglycemia: Secondary | ICD-10-CM

## 2020-03-10 DIAGNOSIS — N200 Calculus of kidney: Secondary | ICD-10-CM

## 2020-03-10 DIAGNOSIS — Z794 Long term (current) use of insulin: Secondary | ICD-10-CM

## 2020-03-10 NOTE — Progress Notes (Signed)
Subjective:    Adrienne Lane - 54 y.o. female MRN [id number]  Date of birth: 05-17-1966  HPI  Adrienne Lane is here for hospital f/u for nephrolithiasis. Patient underwent a left PCNL. Nephrostomy tube was removed prior to d/c. She was discharged with left uretal stent in place. She has f/u scheduled for stent removal and also has surgery scheduled for the right side. She will hold Eliquis 5 days prior to her surgery. She denies pain and has not had to use her narcotic. She is not taking the Flomax. Denies difficulty with urinating. She does report that when she resumed Eliquis she did note some pink tinge to her urine occasionally. Denies frank blood or passage of clots.      Health Maintenance:  Health Maintenance Due  Topic Date Due  . FOOT EXAM  Never done  . HIV Screening  Never done  . PAP SMEAR-Modifier  Never done  . MAMMOGRAM  Never done    -  reports that she has never smoked. She has never used smokeless tobacco. - Review of Systems: Per HPI. - Past Medical History: Patient Active Problem List   Diagnosis Date Noted  . Non-ischemic cardiomyopathy ([place])   . Normocytic anemia   . Nephrolithiasis 02/14/2020  . Paroxysmal atrial fibrillation ([place]) 12/18/2019  . 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 Gross hematuria 12/18/2019  . Staghorn renal calculus 12/18/2019  . Chronic kidney disease (CKD), stage IV (severe) ([place]) 12/18/2019  . Umbilical hernia without obstruction and without gangrene 12/18/2019  . Uterine fibroid 10/25/2019  . Right hip pain   . Type 2 diabetes mellitus ([place])   . Essential hypertension   . CHF (congestive heart failure) ([place]) 10/21/2019  . Elevated troponin 10/21/2019  . AKI (acute kidney injury) ([place]) 10/21/2019  . Hyperglycemia 10/21/2019  . Atrial fibrillation with RVR ([place]) 10/21/2019  . Atrial fibrillation with rapid ventricular response ([place]) 10/20/2019   - Medications: reviewed and updated   Objective:   Physical Exam BP (!) 154/94    Pulse 85   Temp (!) 97.2 F (36.2 C) (Temporal)   Resp 17   Wt 223 lb (101.2 kg)   SpO2 98%   BMI 33.91 kg/m  Physical Exam  Constitutional: She is oriented to person, place, and time and well-developed, well-nourished, and in no distress. No distress.  Cardiovascular: Normal rate.  Pulmonary/Chest: Effort normal. No respiratory distress.  Musculoskeletal:        General: Normal range of motion.  Neurological: She is alert and oriented to person, place, and time.  Skin: Skin is warm and dry. She is not diaphoretic.  Psychiatric: Affect and judgment normal.           Assessment & Plan:   1. Hospital discharge follow-up  2. Nephrolithiasis Has f/u planned with urology. Seems to be recovering well. Suspect pink tinged urine related to anticoagulation, does not sound concerning for frank bleeding.   3. Essential hypertension BP elevated today. Patient reports she took her medications later than she typically does this AM. Reviewed recent BPs, at goal over the past few measurements. Will continue to monitor.    4. Encounter for screening mammogram for malignant neoplasm of breast - MM DIGITAL SCREENING BILATERAL; Future  5. Type 2 diabetes mellitus with hyperglycemia, with long-term current use of insulin (HCC) On Insulin and reports compliance. A1c trend 11.7> 8.4. from Oct to Feb. Patient able to state her goal is <7%. Will continue to work on glucose control. Patient wishes to wait  to address further until after surgery. Plan for f/u in May.      Phill Myron, D.O. 03/10/2020, 8:51 AM Primary Care at [facility]

## 2020-03-12 ENCOUNTER — Other Ambulatory Visit: Payer: Self-pay

## 2020-03-12 ENCOUNTER — Encounter (HOSPITAL_COMMUNITY): Payer: Self-pay

## 2020-03-12 DIAGNOSIS — Z1231 Encounter for screening mammogram for malignant neoplasm of breast: Secondary | ICD-10-CM

## 2020-03-12 NOTE — Patient Instructions (Addendum)
DUE TO COVID-19 ONLY TWO VISITORS ARE ALLOWED TO COME WITH YOU AND STAY IN THE WAITING ROOM ONLY DURING PRE OP AND PROCEDURE. THE TWO VISITORS MAY VISIT WITH YOU IN YOUR PRIVATE ROOM DURING VISITING HOURS ONLY!!   COVID SWAB TESTING MUST BE COMPLETED ON:  Thursday, March 20, 2020 at   [address], [place] [facility] enter pre surgical testing line (Must self quarantine after testing. Follow instructions on handout.)             Your procedure is scheduled on: Monday, March 24, 2020   Report to [facility] Main  Entrance   Report to Short Stay at 5:30 AM   [facility])   Call this number if you have problems the morning of surgery [phone]   Do not eat food or drink liquids :After Midnight.   Oral Hygiene is also important to reduce your risk of infection.                                    Remember - BRUSH YOUR TEETH THE MORNING OF SURGERY WITH YOUR REGULAR TOOTHPASTE   Do NOT smoke after Midnight   Take these medicines the morning of surgery with A SIP OF WATER: Amlodipine, Amoxicillin, Carvedilol, Clindamycin, Hydralazine, Isosorbide, Mexiletine    THE MORNING OF SURGERY, take 7.5   units of  Lantus       insulin.  DO NOT TAKE ANY ORAL DIABETIC MEDICATIONS DAY OF YOUR SURGERY                               You may not have any metal on your body including hair pins, jewelry, and body piercings             Do not wear make-up, lotions, powders, perfumes/cologne, or deodorant             Do not wear nail polish.  Do not shave  48 hours prior to surgery.               Do not bring valuables to the hospital. [place].   Contacts, dentures or bridgework may not be worn into surgery.   Bring small overnight bag day of surgery.   Special Instructions: Bring a copy of your healthcare power of attorney and living will documents the day of surgery if you haven't scanned them in before.               Please read over the following fact sheets you were given:  How to Manage Your Diabetes Before and After Surgery  Why is it important to control my blood sugar before and after surgery? . Improving blood sugar levels before and after surgery helps healing and can limit problems. . A way of improving blood sugar control is eating a healthy diet by: o  Eating less sugar and carbohydrates o  Increasing activity/exercise o  Talking with your doctor about reaching your blood sugar goals . High blood sugars (greater than 180 mg/dL) can raise your risk of infections and slow your recovery, so you will need to focus on controlling your diabetes during the weeks before surgery. . Make sure that the doctor who takes care of your  diabetes knows about your planned surgery including the date and location.  How do I manage my blood sugar before surgery? . Check your blood sugar at least 4 times a day, starting 2 days before surgery, to make sure that the level is not too high or low. o Check your blood sugar the morning of your surgery when you wake up and every 2 hours until you get to the Short Stay unit. . If your blood sugar is less than 70 mg/dL, you will need to treat for low blood sugar: o Do not take insulin. o Treat a low blood sugar (less than 70 mg/dL) with  cup of clear juice (cranberry or apple), 4 glucose tablets, OR glucose gel. o Recheck blood sugar in 15 minutes after treatment (to make sure it is greater than 70 mg/dL). If your blood sugar is not greater than 70 mg/dL on recheck, call [phone] for further instructions. . Report your blood sugar to the short stay nurse when you get to Short Stay.  . If you are admitted to the hospital after surgery: o Your blood sugar will be checked by the staff and you will probably be given insulin after surgery (instead of oral diabetes medicines) to make sure you have good blood sugar levels. o The goal for blood sugar control after surgery  is 80-180 mg/dL.   WHAT DO I DO ABOUT MY DIABETES MEDICATION?  Marland Kitchen Do not take oral diabetes medicines (pills) the morning of surgery.  . THE NIGHT BEFORE SURGERY, take 15    units of  Lantus     insulin.      . THE MORNING OF SURGERY, take 7.5   units of  Lantus       insulin.  Reviewed and Endorsed by [facility] Patient Education Committee, August 2015  [facility] - Preparing for Surgery Before surgery, you can play an important role.  Because skin is not sterile, your skin needs to be as free of germs as possible.  You can reduce the number of germs on your skin by washing with CHG (chlorahexidine gluconate) soap before surgery.  CHG is an antiseptic cleaner which kills germs and bonds with the skin to continue killing germs even after washing. Please DO NOT use if you have an allergy to CHG or antibacterial soaps.  If your skin becomes reddened/irritated stop using the CHG and inform your nurse when you arrive at Short Stay. Do not shave (including legs and underarms) for at least 48 hours prior to the first CHG shower.  You may shave your face/neck.  Please follow these instructions carefully:  1.  Shower with CHG Soap the night before surgery and the  morning of surgery.  2.  If you choose to wash your hair, wash your hair first as usual with your normal  shampoo.  3.  After you shampoo, rinse your hair and body thoroughly to remove the shampoo.                             4.  Use CHG as you would any other liquid soap.  You can apply chg directly to the skin and wash.  Gently with a scrungie or clean washcloth.  5.  Apply the CHG Soap to your body ONLY FROM THE NECK DOWN.   Do not use on face/ open  Wound or open sores. Avoid contact with eyes, ears mouth and genitals (private parts).                       Wash face,  Genitals (private parts) with your normal soap.             6.  Wash thoroughly, paying special attention to the area where your surgery  will  be performed.  7.  Thoroughly rinse your body with warm water from the neck down.  8.  DO NOT shower/wash with your normal soap after using and rinsing off the CHG Soap.                9.  Pat yourself dry with a clean towel.            10.  Wear clean pajamas.            11.  Place clean sheets on your bed the night of your first shower and do not  sleep with pets. Day of Surgery : Do not apply any lotions/deodorants the morning of surgery.  Please wear clean clothes to the hospital/surgery center.  FAILURE TO FOLLOW THESE INSTRUCTIONS MAY RESULT IN THE CANCELLATION OF YOUR SURGERY  PATIENT SIGNATURE_________________________________  NURSE SIGNATURE__________________________________  ________________________________________________________________________

## 2020-03-13 ENCOUNTER — Encounter (HOSPITAL_COMMUNITY)
Admission: RE | Admit: 2020-03-13 | Discharge: 2020-03-13 | Disposition: A | Discharge status: Home / Self Care | Payer: Self-pay | Source: Ambulatory Visit | Attending: Urology | Admitting: Urology

## 2020-03-13 ENCOUNTER — Other Ambulatory Visit: Payer: Self-pay

## 2020-03-13 ENCOUNTER — Encounter (HOSPITAL_COMMUNITY): Payer: Self-pay

## 2020-03-13 DIAGNOSIS — Z01812 Encounter for preprocedural laboratory examination: Secondary | ICD-10-CM | POA: Insufficient documentation

## 2020-03-13 HISTORY — DX: Chronic kidney disease, stage 4 (severe): N18.4

## 2020-03-13 LAB — BASIC METABOLIC PANEL
Anion gap: 8 (ref 5–15)
BUN: 22 mg/dL — ABNORMAL HIGH (ref 6–20)
CO2: 27 mmol/L (ref 22–32)
Calcium: 8.8 mg/dL — ABNORMAL LOW (ref 8.9–10.3)
Chloride: 104 mmol/L (ref 98–111)
Creatinine, Ser: 1.51 mg/dL — ABNORMAL HIGH (ref 0.44–1.00)
GFR calc Af Amer: 46 mL/min — ABNORMAL LOW (ref >60)
GFR calc non Af Amer: 39 mL/min — ABNORMAL LOW (ref >60)
Glucose, Bld: 215 mg/dL — ABNORMAL HIGH (ref 70–99)
Potassium: 3.8 mmol/L (ref 3.5–5.1)
Sodium: 139 mmol/L (ref 135–145)

## 2020-03-13 LAB — CBC
HCT: 28.6 % — ABNORMAL LOW (ref 36.0–46.0)
Hemoglobin: 9.1 g/dL — ABNORMAL LOW (ref 12.0–15.0)
MCH: 29.1 pg (ref 26.0–34.0)
MCHC: 31.8 g/dL (ref 30.0–36.0)
MCV: 91.4 fL (ref 80.0–100.0)
Platelets: 189 10*3/uL (ref 150–400)
RBC: 3.13 MIL/uL — ABNORMAL LOW (ref 3.87–5.11)
RDW: 15 % (ref 11.5–15.5)
WBC: 5.6 10*3/uL (ref 4.0–10.5)
nRBC: 0 % (ref 0.0–0.2)

## 2020-03-13 LAB — GLUCOSE, CAPILLARY: Glucose-Capillary: 198 mg/dL — ABNORMAL HIGH (ref 70–99)

## 2020-03-13 MED FILL — TRUEPLUS SYR 0.5ML 30GX5/16: 30G X 5/16" | 100 days supply | Qty: 100 | Fill #1

## 2020-03-13 MED FILL — AMLODIPINE BESYLATE 5 MG TA: 5 | 30 days supply | Qty: 30 | Fill #1

## 2020-03-13 MED FILL — ISOSORBIDE MN ER 60 MG TAB: 60 | 30 days supply | Qty: 30 | Fill #3

## 2020-03-13 NOTE — Progress Notes (Signed)
PCP - dr. Jobe Gibbon Cardiologist - Dr. Ardyth Harps  Chest x-ray - 02/22/20 EKG - 03/04/20 Stress Test -  ECHO - 02/04/20 Cardiac Cath - 11/09/19  Sleep Study - no CPAP -   Fasting Blood Sugar - 140-180 Checks Blood Sugar __QD___ times a day  Blood Thinner Instructions:Eliquis Aspirin Instructions:Stop 5 days prior to DOS Last Dose:03/19/20  Anesthesia review:   Patient denies shortness of breath, fever, cough and chest pain at PAT appointment yes  Patient verbalized understanding of instructions that were given to them at the PAT appointment. Patient was also instructed that they will need to review over the PAT instructions again at home before surgery. yes

## 2020-03-20 ENCOUNTER — Other Ambulatory Visit (HOSPITAL_COMMUNITY)
Admission: RE | Admit: 2020-03-20 | Discharge: 2020-03-20 | Disposition: A | Discharge status: Home / Self Care | Payer: HRSA Program | Source: Ambulatory Visit | Attending: Urology | Admitting: Urology

## 2020-03-20 DIAGNOSIS — Z20822 Contact with and (suspected) exposure to covid-19: Secondary | ICD-10-CM | POA: Insufficient documentation

## 2020-03-20 DIAGNOSIS — Z01812 Encounter for preprocedural laboratory examination: Secondary | ICD-10-CM | POA: Diagnosis present

## 2020-03-20 LAB — SARS CORONAVIRUS 2 (TAT 6-24 HRS): SARS Coronavirus 2: NEGATIVE

## 2020-03-23 MED ORDER — GENTAMICIN SULFATE 40 MG/ML IJ SOLN
5.0000 mg/kg | INTRAVENOUS | Status: AC
  Administered 2020-03-24: 400 mg via INTRAVENOUS
  Filled 2020-03-23: qty 10

## 2020-03-23 NOTE — Anesthesia Preprocedure Evaluation (Addendum)
Anesthesia Evaluation  Patient identified by MRN, date of birth, ID bandGeneral Assessment Comment:Patient awake but reports fatigue  Reviewed: Allergy & Precautions, NPO status , Patient's Chart, lab work & pertinent test results, reviewed documented beta blocker date and time   Airway Mallampati: I  TM Distance: >3 FB Neck ROM: Full    Dental no notable dental hx.    Pulmonary shortness of breath and with exertion,    Pulmonary exam normal breath sounds clear to auscultation       Cardiovascular hypertension, Pt. on medications and Pt. on home beta blockers +CHF and + DOE  Normal cardiovascular exam+ dysrhythmias Atrial Fibrillation  Rhythm:Regular Rate:Normal  ECHO: 1. Inferobasal hypokinesis EF hard to judge as patient in bigeminal rhythm most of study No starin imaging done . Left ventricular ejection fraction, by estimation, is 50 to 55%. The left ventricle has low normal function. The left ventricle demonstrates regional wall motion abnormalities (see scoring diagram/findings for description). The left ventricular internal cavity size was mildly dilated. There is mild left ventricular hypertrophy. Left ventricular diastolic parameters are indeterminate. 2. Right ventricular systolic function is normal. The right ventricular size is normal. There is mildly elevated pulmonary artery systolic pressure. 3. Left atrial size was moderately dilated. 4. The mitral valve is normal in structure and function. Mild mitral valve regurgitation. 5. Tricuspid valve regurgitation is mild to moderate. 6. The aortic valve is tricuspid. Aortic valve regurgitation is trivial. Mild aortic valve sclerosis is present, with no evidence of aortic valve stenosis. 7. The inferior vena cava is dilated in size with >50% respiratory variability, suggesting right atrial pressure of 8 mmHg.   Neuro/Psych negative neurological ROS  negative psych ROS   GI/Hepatic negative GI ROS, Neg liver ROS,   Endo/Other  diabetes, Insulin Dependent  Renal/GU CRFRenal disease     Musculoskeletal negative musculoskeletal ROS (+)   Abdominal (+) + obese,   Peds  Hematology  (+) Blood dyscrasia, anemia ,   Anesthesia Other Findings LEFT PARTIAL STAGHORN STONE  Reproductive/Obstetrics hcg negative                            Anesthesia Physical  Anesthesia Plan  ASA: III  Anesthesia Plan: General   Post-op Pain Management:    Induction: Intravenous  PONV Risk Score and Plan: 4 or greater and Dexamethasone, Ondansetron and Treatment may vary due to age or medical condition  Airway Management Planned: LMA and Oral ETT  Additional Equipment:   Intra-op Plan:   Post-operative Plan: Extubation in OR  Informed Consent: I have reviewed the patients History and Physical, chart, labs and discussed the procedure including the risks, benefits and alternatives for the proposed anesthesia with the patient or authorized representative who has indicated his/her understanding and acceptance.     Dental advisory given  Plan Discussed with: CRNA and Anesthesiologist  Anesthesia Plan Comments:         Anesthesia Quick Evaluation

## 2020-03-24 ENCOUNTER — Other Ambulatory Visit: Payer: Self-pay

## 2020-03-24 ENCOUNTER — Encounter (HOSPITAL_COMMUNITY)
Admission: RE | Disposition: A | Discharge status: Home / Self Care | Payer: Self-pay | Source: Home / Self Care | Attending: Urology

## 2020-03-24 ENCOUNTER — Ambulatory Visit (HOSPITAL_COMMUNITY): Payer: Self-pay | Admitting: Physician Assistant

## 2020-03-24 ENCOUNTER — Inpatient Hospital Stay (HOSPITAL_COMMUNITY)
Admission: RE | Admit: 2020-03-24 | Discharge: 2020-03-25 | DRG: 660 | Disposition: A | Discharge status: Home / Self Care | Payer: Self-pay | Attending: Urology | Admitting: Urology

## 2020-03-24 ENCOUNTER — Encounter (HOSPITAL_COMMUNITY): Payer: Self-pay | Admitting: Urology

## 2020-03-24 ENCOUNTER — Ambulatory Visit (HOSPITAL_COMMUNITY): Payer: Self-pay

## 2020-03-24 DIAGNOSIS — Z91048 Other nonmedicinal substance allergy status: Secondary | ICD-10-CM

## 2020-03-24 DIAGNOSIS — Z7901 Long term (current) use of anticoagulants: Secondary | ICD-10-CM

## 2020-03-24 DIAGNOSIS — I130 Hypertensive heart and chronic kidney disease with heart failure and stage 1 through stage 4 chronic kidney disease, or unspecified chronic kidney disease: Secondary | ICD-10-CM | POA: Diagnosis present

## 2020-03-24 DIAGNOSIS — E1122 Type 2 diabetes mellitus with diabetic chronic kidney disease: Secondary | ICD-10-CM | POA: Diagnosis present

## 2020-03-24 DIAGNOSIS — I480 Paroxysmal atrial fibrillation: Secondary | ICD-10-CM | POA: Diagnosis present

## 2020-03-24 DIAGNOSIS — I5022 Chronic systolic (congestive) heart failure: Secondary | ICD-10-CM | POA: Diagnosis present

## 2020-03-24 DIAGNOSIS — Z823 Family history of stroke: Secondary | ICD-10-CM

## 2020-03-24 DIAGNOSIS — N189 Chronic kidney disease, unspecified: Secondary | ICD-10-CM | POA: Diagnosis present

## 2020-03-24 DIAGNOSIS — N200 Calculus of kidney: Principal | ICD-10-CM | POA: Diagnosis present

## 2020-03-24 DIAGNOSIS — Z6834 Body mass index (BMI) 34.0-34.9, adult: Secondary | ICD-10-CM

## 2020-03-24 DIAGNOSIS — E669 Obesity, unspecified: Secondary | ICD-10-CM | POA: Diagnosis present

## 2020-03-24 DIAGNOSIS — Z79899 Other long term (current) drug therapy: Secondary | ICD-10-CM

## 2020-03-24 DIAGNOSIS — Z833 Family history of diabetes mellitus: Secondary | ICD-10-CM

## 2020-03-24 DIAGNOSIS — Z794 Long term (current) use of insulin: Secondary | ICD-10-CM

## 2020-03-24 DIAGNOSIS — Z20822 Contact with and (suspected) exposure to covid-19: Secondary | ICD-10-CM | POA: Diagnosis present

## 2020-03-24 DIAGNOSIS — Z8249 Family history of ischemic heart disease and other diseases of the circulatory system: Secondary | ICD-10-CM

## 2020-03-24 DIAGNOSIS — D649 Anemia, unspecified: Secondary | ICD-10-CM | POA: Diagnosis present

## 2020-03-24 DIAGNOSIS — F17200 Nicotine dependence, unspecified, uncomplicated: Secondary | ICD-10-CM | POA: Diagnosis present

## 2020-03-24 HISTORY — PX: NEPHROLITHOTOMY: SHX5134

## 2020-03-24 LAB — SAMPLE TO BLOOD BANK

## 2020-03-24 LAB — GLUCOSE, CAPILLARY
Glucose-Capillary: 168 mg/dL — ABNORMAL HIGH (ref 70–99)
Glucose-Capillary: 240 mg/dL — ABNORMAL HIGH (ref 70–99)
Glucose-Capillary: 260 mg/dL — ABNORMAL HIGH (ref 70–99)
Glucose-Capillary: 220 mg/dL — ABNORMAL HIGH (ref 70–99)
Glucose-Capillary: 274 mg/dL — ABNORMAL HIGH (ref 70–99)

## 2020-03-24 LAB — HEMOGLOBIN AND HEMATOCRIT, BLOOD
HCT: 37.7 % (ref 36.0–46.0)
Hemoglobin: 11.7 g/dL — ABNORMAL LOW (ref 12.0–15.0)

## 2020-03-24 LAB — PREGNANCY, URINE: Preg Test, Ur: NEGATIVE

## 2020-03-24 SURGERY — NEPHROLITHOTOMY PERCUTANEOUS
Anesthesia: General | Laterality: Right

## 2020-03-24 MED ORDER — MIDAZOLAM HCL 2 MG/2ML IJ SOLN
INTRAMUSCULAR | Status: DC | PRN
  Administered 2020-03-24: 2 mg via INTRAVENOUS

## 2020-03-24 MED ORDER — INSULIN ASPART 100 UNIT/ML ~~LOC~~ SOLN
6.0000 [IU] | Freq: Once | SUBCUTANEOUS | Status: AC
  Administered 2020-03-24: 6 [IU] via SUBCUTANEOUS

## 2020-03-24 MED ORDER — GENTAMICIN SULFATE 40 MG/ML IJ SOLN
400.0000 mg | INTRAVENOUS | Status: DC
  Administered 2020-03-25: 07:00:00 400 mg via INTRAVENOUS
  Filled 2020-03-24: qty 10

## 2020-03-24 MED ORDER — IOHEXOL 300 MG/ML  SOLN
INTRAMUSCULAR | Status: DC | PRN
  Administered 2020-03-24: 75 mL via ORAL

## 2020-03-24 MED ORDER — FENTANYL CITRATE (PF) 100 MCG/2ML IJ SOLN: 25.0000 ug | INTRAMUSCULAR | Status: DC | PRN

## 2020-03-24 MED ORDER — CHLORHEXIDINE GLUCONATE CLOTH 2 % EX PADS: 6.0000 | MEDICATED_PAD | Freq: Every day | CUTANEOUS | Status: DC

## 2020-03-24 MED ORDER — MIDAZOLAM HCL 2 MG/2ML IJ SOLN
INTRAMUSCULAR | Status: AC
  Filled 2020-03-24: qty 2

## 2020-03-24 MED ORDER — ROCURONIUM BROMIDE 10 MG/ML (PF) SYRINGE
PREFILLED_SYRINGE | INTRAVENOUS | Status: AC
  Filled 2020-03-24: qty 10

## 2020-03-24 MED ORDER — DEXAMETHASONE SODIUM PHOSPHATE 10 MG/ML IJ SOLN
INTRAMUSCULAR | Status: DC | PRN
  Administered 2020-03-24: 8 mg via INTRAVENOUS

## 2020-03-24 MED ORDER — FENTANYL CITRATE (PF) 100 MCG/2ML IJ SOLN
INTRAMUSCULAR | Status: DC | PRN
  Administered 2020-03-24 (×2): 50 ug via INTRAVENOUS

## 2020-03-24 MED ORDER — BUPIVACAINE-EPINEPHRINE 0.5% -1:200000 IJ SOLN
INTRAMUSCULAR | Status: DC | PRN
  Administered 2020-03-24: 30 mL

## 2020-03-24 MED ORDER — FENTANYL CITRATE (PF) 100 MCG/2ML IJ SOLN
INTRAMUSCULAR | Status: AC
  Filled 2020-03-24: qty 2

## 2020-03-24 MED ORDER — ONDANSETRON HCL 4 MG/2ML IJ SOLN
INTRAMUSCULAR | Status: AC
  Filled 2020-03-24: qty 2

## 2020-03-24 MED ORDER — PHENYLEPHRINE 40 MCG/ML (10ML) SYRINGE FOR IV PUSH (FOR BLOOD PRESSURE SUPPORT)
PREFILLED_SYRINGE | INTRAVENOUS | Status: DC | PRN
  Administered 2020-03-24 (×3): 120 ug via INTRAVENOUS

## 2020-03-24 MED ORDER — ACETAMINOPHEN 325 MG PO TABS: 650.0000 mg | ORAL_TABLET | ORAL | Status: DC | PRN

## 2020-03-24 MED ORDER — OXYCODONE HCL 5 MG PO TABS: 5.0000 mg | ORAL_TABLET | ORAL | Status: DC | PRN

## 2020-03-24 MED ORDER — ONDANSETRON HCL 4 MG/2ML IJ SOLN: 4.0000 mg | Freq: Once | INTRAMUSCULAR | Status: DC | PRN

## 2020-03-24 MED ORDER — ONDANSETRON HCL 4 MG/2ML IJ SOLN
INTRAMUSCULAR | Status: DC | PRN
  Administered 2020-03-24: 4 mg via INTRAVENOUS

## 2020-03-24 MED ORDER — PROPOFOL 10 MG/ML IV BOLUS
INTRAVENOUS | Status: DC | PRN
  Administered 2020-03-24: 130 mg via INTRAVENOUS

## 2020-03-24 MED ORDER — LIDOCAINE 2% (20 MG/ML) 5 ML SYRINGE
INTRAMUSCULAR | Status: DC | PRN
  Administered 2020-03-24: 60 mg via INTRAVENOUS

## 2020-03-24 MED ORDER — MEPERIDINE HCL 50 MG/ML IJ SOLN: 6.2500 mg | INTRAMUSCULAR | Status: DC | PRN

## 2020-03-24 MED ORDER — POTASSIUM CHLORIDE IN NACL 20-0.9 MEQ/L-% IV SOLN
INTRAVENOUS | Status: DC
  Filled 2020-03-24 (×2): qty 1000

## 2020-03-24 MED ORDER — BUPIVACAINE-EPINEPHRINE (PF) 0.5% -1:200000 IJ SOLN
INTRAMUSCULAR | Status: AC
  Filled 2020-03-24: qty 30

## 2020-03-24 MED ORDER — HYDRALAZINE HCL 50 MG PO TABS
100.0000 mg | ORAL_TABLET | Freq: Three times a day (TID) | ORAL | Status: DC
  Administered 2020-03-24 – 2020-03-25 (×3): 100 mg via ORAL
  Filled 2020-03-24 (×2): qty 2

## 2020-03-24 MED ORDER — ACETAMINOPHEN 325 MG PO TABS: 325.0000 mg | ORAL_TABLET | ORAL | Status: DC | PRN

## 2020-03-24 MED ORDER — LIDOCAINE 2% (20 MG/ML) 5 ML SYRINGE
INTRAMUSCULAR | Status: AC
  Filled 2020-03-24: qty 5

## 2020-03-24 MED ORDER — SENNA 8.6 MG PO TABS
1.0000 | ORAL_TABLET | Freq: Two times a day (BID) | ORAL | Status: DC
  Administered 2020-03-24 – 2020-03-25 (×2): 8.6 mg via ORAL
  Filled 2020-03-24 (×2): qty 1

## 2020-03-24 MED ORDER — CARVEDILOL 25 MG PO TABS
25.0000 mg | ORAL_TABLET | Freq: Two times a day (BID) | ORAL | Status: DC
  Administered 2020-03-24 – 2020-03-25 (×2): 25 mg via ORAL
  Filled 2020-03-24 (×2): qty 1

## 2020-03-24 MED ORDER — MORPHINE SULFATE (PF) 2 MG/ML IV SOLN
2.0000 mg | INTRAVENOUS | Status: DC | PRN
  Administered 2020-03-24 (×3): 2 mg via INTRAVENOUS
  Filled 2020-03-24 (×3): qty 1

## 2020-03-24 MED ORDER — SODIUM CHLORIDE 0.9 % IR SOLN
Status: DC | PRN
  Administered 2020-03-24: 36000 mL

## 2020-03-24 MED ORDER — SUGAMMADEX SODIUM 500 MG/5ML IV SOLN
INTRAVENOUS | Status: AC
  Filled 2020-03-24: qty 5

## 2020-03-24 MED ORDER — ACETAMINOPHEN 160 MG/5ML PO SOLN: 325.0000 mg | ORAL | Status: DC | PRN

## 2020-03-24 MED ORDER — PHENYLEPHRINE HCL (PRESSORS) 10 MG/ML IV SOLN
INTRAVENOUS | Status: AC
  Filled 2020-03-24: qty 1

## 2020-03-24 MED ORDER — LACTATED RINGERS IV SOLN: INTRAVENOUS | Status: DC

## 2020-03-24 MED ORDER — PHENYLEPHRINE 40 MCG/ML (10ML) SYRINGE FOR IV PUSH (FOR BLOOD PRESSURE SUPPORT)
PREFILLED_SYRINGE | INTRAVENOUS | Status: AC
  Filled 2020-03-24: qty 10

## 2020-03-24 MED ORDER — ROCURONIUM BROMIDE 10 MG/ML (PF) SYRINGE
PREFILLED_SYRINGE | INTRAVENOUS | Status: DC | PRN
  Administered 2020-03-24: 50 mg via INTRAVENOUS
  Administered 2020-03-24: 20 mg via INTRAVENOUS
  Administered 2020-03-24: 10 mg via INTRAVENOUS
  Administered 2020-03-24: 20 mg via INTRAVENOUS

## 2020-03-24 MED ORDER — OXYCODONE HCL 5 MG/5ML PO SOLN: 5.0000 mg | Freq: Once | ORAL | Status: DC | PRN

## 2020-03-24 MED ORDER — INSULIN ASPART 100 UNIT/ML ~~LOC~~ SOLN
SUBCUTANEOUS | Status: AC
  Filled 2020-03-24: qty 1

## 2020-03-24 MED ORDER — OXYCODONE HCL 5 MG PO TABS: 5.0000 mg | ORAL_TABLET | Freq: Once | ORAL | Status: DC | PRN

## 2020-03-24 MED ORDER — MEXILETINE HCL 150 MG PO CAPS
300.0000 mg | ORAL_CAPSULE | Freq: Two times a day (BID) | ORAL | Status: DC
  Administered 2020-03-24 – 2020-03-25 (×2): 300 mg via ORAL
  Filled 2020-03-24 (×3): qty 2

## 2020-03-24 MED ORDER — INSULIN ASPART 100 UNIT/ML ~~LOC~~ SOLN
0.0000 [IU] | Freq: Three times a day (TID) | SUBCUTANEOUS | Status: DC
  Administered 2020-03-24 (×2): 7 [IU] via SUBCUTANEOUS
  Administered 2020-03-25: 3 [IU] via SUBCUTANEOUS

## 2020-03-24 MED ORDER — PROPOFOL 10 MG/ML IV BOLUS
INTRAVENOUS | Status: AC
  Filled 2020-03-24: qty 20

## 2020-03-24 MED ORDER — DOCUSATE SODIUM 100 MG PO CAPS
100.0000 mg | ORAL_CAPSULE | Freq: Two times a day (BID) | ORAL | Status: DC
  Administered 2020-03-24 – 2020-03-25 (×2): 100 mg via ORAL
  Filled 2020-03-24 (×2): qty 1

## 2020-03-24 MED ORDER — SUGAMMADEX SODIUM 200 MG/2ML IV SOLN
INTRAVENOUS | Status: DC | PRN
  Administered 2020-03-24: 210 mg via INTRAVENOUS

## 2020-03-24 MED ORDER — SODIUM CHLORIDE (PF) 0.9 % IJ SOLN
INTRAMUSCULAR | Status: AC
  Filled 2020-03-24: qty 10

## 2020-03-24 MED ORDER — PHENYLEPHRINE HCL-NACL 10-0.9 MG/250ML-% IV SOLN
INTRAVENOUS | Status: DC | PRN
  Administered 2020-03-24: 40 ug/min via INTRAVENOUS

## 2020-03-24 MED ORDER — CEFAZOLIN SODIUM-DEXTROSE 1-4 GM/50ML-% IV SOLN
1.0000 g | Freq: Three times a day (TID) | INTRAVENOUS | Status: DC
  Administered 2020-03-24 – 2020-03-25 (×3): 1 g via INTRAVENOUS
  Filled 2020-03-24 (×6): qty 50

## 2020-03-24 MED ORDER — CEFAZOLIN SODIUM-DEXTROSE 2-4 GM/100ML-% IV SOLN
2.0000 g | INTRAVENOUS | Status: AC
  Administered 2020-03-24: 2 g via INTRAVENOUS
  Filled 2020-03-24: qty 100

## 2020-03-24 MED ORDER — STERILE WATER FOR IRRIGATION IR SOLN
Status: DC | PRN
  Administered 2020-03-24: 500 mL

## 2020-03-24 MED ORDER — AMLODIPINE BESYLATE 5 MG PO TABS
5.0000 mg | ORAL_TABLET | Freq: Every day | ORAL | Status: DC
  Administered 2020-03-25: 5 mg via ORAL
  Filled 2020-03-24: qty 1

## 2020-03-24 MED ORDER — DEXAMETHASONE SODIUM PHOSPHATE 10 MG/ML IJ SOLN
INTRAMUSCULAR | Status: AC
  Filled 2020-03-24: qty 1

## 2020-03-24 MED ORDER — TORSEMIDE 20 MG PO TABS
40.0000 mg | ORAL_TABLET | Freq: Two times a day (BID) | ORAL | Status: DC
  Administered 2020-03-24 – 2020-03-25 (×2): 40 mg via ORAL
  Filled 2020-03-24 (×3): qty 2

## 2020-03-24 MED ORDER — 0.9 % SODIUM CHLORIDE (POUR BTL) OPTIME
TOPICAL | Status: DC | PRN
  Administered 2020-03-24: 1000 mL

## 2020-03-24 SURGICAL SUPPLY — 52 items
BAG URINE DRAIN 2000ML AR STRL (UROLOGICAL SUPPLIES) ×2 IMPLANT
BASKET STONE NCOMPASS (UROLOGICAL SUPPLIES) IMPLANT
BASKET ZERO TIP NITINOL 2.4FR (BASKET) ×2 IMPLANT
BENZOIN TINCTURE PRP APPL 2/3 (GAUZE/BANDAGES/DRESSINGS) ×2 IMPLANT
BLADE SURG 15 STRL LF DISP TIS (BLADE) ×1 IMPLANT
BLADE SURG 15 STRL SS (BLADE) ×1
CATH AINSWORTH 30CC 24FR (CATHETERS) ×2 IMPLANT
CATH IMAGER II 65CM (CATHETERS) ×4 IMPLANT
CATH URET 5FR 28IN OPEN ENDED (CATHETERS) ×2 IMPLANT
CATH URET DUAL LUMEN 6-10FR 50 (CATHETERS) ×2 IMPLANT
CATH X-FORCE N30 NEPHROSTOMY (TUBING) ×2 IMPLANT
CHLORAPREP W/TINT 26 (MISCELLANEOUS) ×2 IMPLANT
COVER WAND RF STERILE (DRAPES) IMPLANT
DRAPE C-ARM 42X120 X-RAY (DRAPES) ×2 IMPLANT
DRAPE LINGEMAN PERC (DRAPES) ×2 IMPLANT
DRSG PAD ABDOMINAL 8X10 ST (GAUZE/BANDAGES/DRESSINGS) ×4 IMPLANT
DRSG TEGADERM 8X12 (GAUZE/BANDAGES/DRESSINGS) ×2 IMPLANT
EXTRACTOR STONE 1.7FRX115CM (UROLOGICAL SUPPLIES) IMPLANT
FIBER LASER FLEXIVA 365 (UROLOGICAL SUPPLIES) ×2 IMPLANT
FIBER LASER TRAC TIP (UROLOGICAL SUPPLIES) IMPLANT
GAUZE SPONGE 4X4 12PLY STRL (GAUZE/BANDAGES/DRESSINGS) ×2 IMPLANT
GLOVE BIOGEL M STRL SZ7.5 (GLOVE) ×2 IMPLANT
GOWN STRL REUS W/TWL XL LVL3 (GOWN DISPOSABLE) ×2 IMPLANT
GUIDEWIRE AMPLAZ .035X145 (WIRE) ×4 IMPLANT
GUIDEWIRE ANG ZIPWIRE 038X150 (WIRE) IMPLANT
GUIDEWIRE SENSOR ANG DUAL FLEX (WIRE) IMPLANT
GUIDEWIRE STR DUAL SENSOR (WIRE) ×4 IMPLANT
HOLDER NEEDLE AMPLATZ W/INSERT (MISCELLANEOUS) IMPLANT
IV SET EXTENSION CATH 6 NF (IV SETS) ×2 IMPLANT
KIT BASIN OR (CUSTOM PROCEDURE TRAY) ×2 IMPLANT
KIT PROBE 340X3.4XDISP GRN (MISCELLANEOUS) IMPLANT
KIT PROBE TRILOGY 3.4X340 (MISCELLANEOUS)
KIT PROBE TRILOGY 3.9X350 (MISCELLANEOUS) ×2 IMPLANT
KIT TURNOVER KIT A (KITS) IMPLANT
MANIFOLD NEPTUNE II (INSTRUMENTS) ×2 IMPLANT
NEEDLE SPNL 20GX3.5 QUINCKE YW (NEEDLE) IMPLANT
NEEDLE TROCAR 18X15 ECHO (NEEDLE) IMPLANT
NEEDLE TROCAR 18X20 (NEEDLE) IMPLANT
NS IRRIG 1000ML POUR BTL (IV SOLUTION) ×2 IMPLANT
PACK CYSTO (CUSTOM PROCEDURE TRAY) ×2 IMPLANT
SHEATH PEELAWAY SET 9 (SHEATH) IMPLANT
SPONGE LAP 4X18 RFD (DISPOSABLE) ×2 IMPLANT
STENT URET 6FRX26 CONTOUR (STENTS) ×2 IMPLANT
SUT ETHILON 3 0 PS 1 (SUTURE) ×2 IMPLANT
SYR 10ML LL (SYRINGE) ×2 IMPLANT
SYR 20ML LL LF (SYRINGE) ×4 IMPLANT
SYR 50ML LL SCALE MARK (SYRINGE) ×2 IMPLANT
TOWEL OR 17X26 10 PK STRL BLUE (TOWEL DISPOSABLE) ×2 IMPLANT
TRAY FOLEY MTR SLVR 16FR STAT (SET/KITS/TRAYS/PACK) ×2 IMPLANT
TUBING CONNECTING 10 (TUBING) ×4 IMPLANT
TUBING STONE CATCHER TRILOGY (MISCELLANEOUS) ×2 IMPLANT
TUBING UROLOGY SET (TUBING) ×2 IMPLANT

## 2020-03-24 NOTE — Interval H&P Note (Signed)
History and Physical Interval Note: Patient underwent left sided PCNL previously. She presents today for right sided PCNL and left ureteral stent removal.  03/24/2020 7:01 AM  Karl Luke  has presented today for surgery, with the diagnosis of BILATERAL NEPHROLITHIASIS.  The various methods of treatment have been discussed with the patient and family. After consideration of risks, benefits and other options for treatment, the patient has consented to  Procedure(s): NEPHROLITHOTOMY PERCUTANEOUS WITH ACCESS LEFT STENT REMOVAL (Right) as a surgical intervention.  The patient's history has been reviewed, patient examined, no change in status, stable for surgery.  I have reviewed the patient's chart and labs.  Questions were answered to the patient's satisfaction.     Haskel Schroeder

## 2020-03-24 NOTE — Interval H&P Note (Signed)
History and Physical Interval Note:  03/24/2020 7:07 AM  Adrienne Lane  has presented today for surgery, with the diagnosis of BILATERAL NEPHROLITHIASIS.  The various methods of treatment have been discussed with the patient and family. After consideration of risks, benefits and other options for treatment, the patient has consented to  Procedure(s): NEPHROLITHOTOMY PERCUTANEOUS WITH ACCESS LEFT STENT REMOVAL (Right) as a surgical intervention.  The patient's history has been reviewed, patient examined, no change in status, stable for surgery.  I have reviewed the patient's chart and labs.  Questions were answered to the patient's satisfaction.     Ardis Hughs

## 2020-03-24 NOTE — Progress Notes (Signed)
Pharmacy Antibiotic Note  Adrienne Lane is a 54 y.o. female admitted on 03/24/2020 with UTI.  Pharmacy has been consulted for gentamicin dosing.  Plan:  Gentamicin 400 mg IV q24h ( 5 mg/kg using adjusted BW of 79.5 kg)    Consider obtaining gentamicin level if pt is on prolonged course.  Monitor clinical course, renal function, cultures as available   Height: 5\' 8"  (172.7 cm) Weight: 103 kg (227 lb) IBW/kg (Calculated) : 63.9  Temp (24hrs), Avg:97.2 F (36.2 C), Min:96.4 F (35.8 C), Max:98.3 F (36.8 C)  No results for input(s): WBC, CREATININE, LATICACIDVEN, VANCOTROUGH, VANCOPEAK, VANCORANDOM, GENTTROUGH, GENTPEAK, GENTRANDOM, TOBRATROUGH, TOBRAPEAK, TOBRARND, AMIKACINPEAK, AMIKACINTROU, AMIKACIN in the last 168 hours.  Estimated Creatinine Clearance: 54.7 mL/min (A) (by C-G formula based on SCr of 1.51 mg/dL (H)).    Allergies  Allergen Reactions  . Adhesive [Tape]     Tears skin, can tolerate paper tape    Antimicrobials this admission: 4/5 cefazolin x 1 4/5 gentamicin   Dose adjustments this admission:    Microbiology results:    Thank you for allowing pharmacy to be a part of this patient's care.   Royetta Asal, PharmD, BCPS 03/24/2020 12:57 PM

## 2020-03-24 NOTE — Anesthesia Postprocedure Evaluation (Signed)
Anesthesia Post Note  Patient: Adrienne Lane  Procedure(s) Performed: NEPHROLITHOTOMY PERCUTANEOUS WITH ACCESS LEFT STENT REMOVAL (Right )     Patient location during evaluation: PACU Anesthesia Type: General Level of consciousness: awake and alert Pain management: pain level controlled Vital Signs Assessment: post-procedure vital signs reviewed and stable Respiratory status: spontaneous breathing, nonlabored ventilation, respiratory function stable and patient connected to nasal cannula oxygen Cardiovascular status: blood pressure returned to baseline and stable Postop Assessment: no apparent nausea or vomiting Anesthetic complications: no    Last Vitals:  Vitals:   03/24/20 0615  BP: (!) 146/80  Pulse: 63  Resp: 18  Temp: 36.8 C  SpO2: 100%    Last Pain:  Vitals:   03/24/20 0639  TempSrc:   PainSc: 0-No pain                 Ziyanna Tolin

## 2020-03-24 NOTE — Transfer of Care (Signed)
Immediate Anesthesia Transfer of Care Note  Patient: Adrienne Lane  Procedure(s) Performed: NEPHROLITHOTOMY PERCUTANEOUS WITH ACCESS LEFT STENT REMOVAL (Right )  Patient Location: PACU  Anesthesia Type:General  Level of Consciousness: awake  Airway & Oxygen Therapy: Patient Spontanous Breathing and Patient connected to face mask oxygen  Post-op Assessment: Report given to RN, Post -op Vital signs reviewed and stable and Patient moving all extremities X 4  Post vital signs: Reviewed and stable  Last Vitals:  Vitals Value Taken Time  BP 143/84 03/24/20 1150  Temp    Pulse 66 03/24/20 1152  Resp 23 03/24/20 1152  SpO2 100 % 03/24/20 1152  Vitals shown include unvalidated device data.  Last Pain:  Vitals:   03/24/20 0639  TempSrc:   PainSc: 0-No pain         Complications: No apparent anesthesia complications

## 2020-03-24 NOTE — Op Note (Signed)
Preoperative Diagnosis: Right renal stone greater than 2 cm   Postoperative Diagnosis: Right renal stone greater than 2 cm   Procedure(s) Performed:   1. Right percutaneous nephrostolithotomy for stone burden greater than 2 cm 2. Right percutaneous renal access to establish nephrostomy tract 3. Cystoscopy with right ureteral catheterization and retrograde pyelogram 4. Antegrade nephrostogram  5. Simple Foley catheter placement 6. Intraoperative fluoroscopy with interpretation less than 1 hour 7. Left ureteral stent removal   Teaching Surgeon:  Louis Meckel, M.D.   Resident Surgeon:  Bethel Born Calistro Rauf,M.D.   Anesthesia:  General via endotracheal tube.     IV Fluids:  See Anesthesia record.   Estimated Blood Loss:  25 cc.   Cultures:  None   Drains:  Right 6 Fr x 26 cm JJ ureteral stent 16 French Foley catheter, to drainage.   Specimens: None   Complications:  None.   Indications for Surgery:  Adrienne Lane is a 54 y.o. female with a history of nephrolithiasis.  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  The patient presents today for percutaneous treatment of right kidney stone as well as removal of left ureteral stent. She had undergone previous left stone treatment and had prior left ureteral stent in place. The risks and benefits of the procedure were discussed with the patient who wishes to proceed.   Operative Findings:   Successful percutaneous access was obtained to the posterior calyx with treatment of all visible stone burden.  Successful placement of right ureteral stent. Successful removal of left ureteral stent.   Radiologic Interpretation of Retrograde Pyelogram and Anterograde Nephrostogram: Retrograde pyelogram demonstrated multiple filling defects consistent with staghorn calculi.  Antegrade nephrostogram post-treatment demonstrated expected contrast along access tract and no other contrast extravasation or obvious  filling defects.    Procedure:  The patient was correctly identified in the preoperative holding area where written informed consent as well as potential risks and complications were reviewed. The patient was brought back to the operative suite. Once correct information was verified, general anesthesia was induced via endotracheal tube.  The patient was then gently repositioned into the prone split leg position, paying careful attention to pad all pressure points and affixed the patient to the bed at multiple points of contact.  She was then prepped and draped in the usual sterile fashion and given appropriate perioperative procedural antibiotics.  Sequential compression devices were placed for VTE prophylaxis.  A timeout was then performed.   At the beginning of the case, cystoscopy was performed per urethra with copious lubrication and normal saline irrigation running.  The urethra and bladder appeared grossly normal.  Turning our attention to the left ureteral orifice, we removed the left ureteral stent using a grasper. We next gently cannulated the RIGHT ureteral orifice with a sensor wire, which was advanced into the renal pelvis under fluoroscopic guidance.  The cystoscope was removed and a dual lumen catheter was advanced over the wire.    Next, we performed a retrograde pyelogram, which demonstrated findings as above.  We elected to obtain lower pole access. We then placed a second wire in retrograde fashion and removed the dual lumen catheter, leaving the wires in place. We then advanced a ureteroscope over the second wire and advanced this to the level of the renal pelvis. We removed the wire from the scope.  We placed a 16 Fr foley catheter to drainage. We obtained access via 18-gauge needle using triangulation technique and fluoroscopic as well  as direct visual guidance.  After placement of the access needle, a wire was advanced into the kidney and manipulated down the patient's ureter. We then  placed a second wire using the assistance of a dual lumen catheter. Next, we developed our percutaneous tract with the advancement of a 30 French x 20 cm Nephromax balloon dilator.  After this, a 70 French sheath was advanced to the edge of the distal calyx on fluoroscopy.     We then performed rigid nephroscopy with the lithotrite. Immediately upon entrance into the collecting system, we encountered the stone and we then proceeded to treat the stone with lithotripsy. After reaching the limit of the stone capable of being treated with the rigid scope, we performed nephroscopy with flexible cystoscope and treated the remaining stone burden with laser lithotripsy and basket extraction. At the end of the procedure, no significant stone burden remained. We placed a 6 Fr x 26 cm JJ ureteral stent in antegrade fashion with the assistance of a wire, this was placed under direct and fluoroscopic guidance. We then removed all of our instruments, and placed a 30 Fr catheter through the sheath to irrigate kidney. Sheath and wires were removed from tract. Antegrade nephrostogram was performed through 30 Fr catheter with findings as noted above. Irrigation of kidney revealed no active bleeding, and we elected to remove the nephrostomy tube catheter, leaving the ureteral stent in place and the urethral catheter to drainage.   We then administered 20 cc of 1% Lidocaine solution locally around the nephrostomy site and closed skin in an interrupted vertical mattress fashion with nylon suture. At this point, the patient was extubated and taken to the recovery area in stable fashion.

## 2020-03-24 NOTE — Anesthesia Procedure Notes (Signed)
Procedure Name: Intubation Date/Time: 03/24/2020 7:42 AM Performed by: Niel Hummer, CRNA Pre-anesthesia Checklist: Patient identified, Emergency Drugs available, Suction available and Patient being monitored Patient Re-evaluated:Patient Re-evaluated prior to induction Oxygen Delivery Method: Circle system utilized Preoxygenation: Pre-oxygenation with 100% oxygen Induction Type: IV induction Ventilation: Mask ventilation without difficulty Laryngoscope Size: Mac and 4 Grade View: Grade I Tube type: Oral Tube size: 7.0 mm Number of attempts: 1 Airway Equipment and Method: Stylet Placement Confirmation: ETT inserted through vocal cords under direct vision,  breath sounds checked- equal and bilateral and positive ETCO2 Secured at: 22 cm Tube secured with: Tape Dental Injury: Teeth and Oropharynx as per pre-operative assessment

## 2020-03-25 ENCOUNTER — Inpatient Hospital Stay (HOSPITAL_COMMUNITY): Payer: Self-pay

## 2020-03-25 LAB — HEMOGLOBIN AND HEMATOCRIT, BLOOD
HCT: 30.6 % — ABNORMAL LOW (ref 36.0–46.0)
Hemoglobin: 9.8 g/dL — ABNORMAL LOW (ref 12.0–15.0)

## 2020-03-25 LAB — BASIC METABOLIC PANEL
Anion gap: 9 (ref 5–15)
BUN: 39 mg/dL — ABNORMAL HIGH (ref 6–20)
CO2: 25 mmol/L (ref 22–32)
Calcium: 9 mg/dL (ref 8.9–10.3)
Chloride: 104 mmol/L (ref 98–111)
Creatinine, Ser: 2.43 mg/dL — ABNORMAL HIGH (ref 0.44–1.00)
GFR calc Af Amer: 26 mL/min — ABNORMAL LOW (ref >60)
GFR calc non Af Amer: 22 mL/min — ABNORMAL LOW (ref >60)
Glucose, Bld: 139 mg/dL — ABNORMAL HIGH (ref 70–99)
Potassium: 4.6 mmol/L (ref 3.5–5.1)
Sodium: 138 mmol/L (ref 135–145)

## 2020-03-25 LAB — GLUCOSE, CAPILLARY
Glucose-Capillary: 144 mg/dL — ABNORMAL HIGH (ref 70–99)
Glucose-Capillary: 143 mg/dL — ABNORMAL HIGH (ref 70–99)

## 2020-03-25 MED ORDER — APIXABAN 5 MG PO TABS: 5.0000 mg | ORAL_TABLET | Freq: Two times a day (BID) | ORAL | 6 refills | Status: DC

## 2020-03-25 MED ORDER — TRAMADOL HCL 50 MG PO TABS: 50.0000 mg | ORAL_TABLET | Freq: Four times a day (QID) | ORAL | 0 refills | Status: DC | PRN

## 2020-03-25 MED FILL — traMADol HCL 50 MG TABS: 50 | 2 days supply | Qty: 15 | Fill #0

## 2020-03-25 NOTE — Progress Notes (Signed)
Urology Inpatient Progress Report  Nephrolithiasis [N20.0]  Procedure(s): NEPHROLITHOTOMY PERCUTANEOUS WITH ACCESS LEFT STENT REMOVAL  1 Day Post-Op   Intv/Subj: No acute events overnight. Patient is without complaint.  Active Problems:   Nephrolithiasis  Current Facility-Administered Medications  Medication Dose Route Frequency Provider Last Rate Last Admin  . 0.9 % NaCl with KCl 20 mEq/ L  infusion   Intravenous Continuous Haskel Schroeder, MD 50 mL/hr at 03/24/20 1520 New Bag at 03/24/20 1520  . acetaminophen (TYLENOL) tablet 650 mg  650 mg Oral Q4H PRN Haskel Schroeder, MD      . amLODipine (NORVASC) tablet 5 mg  5 mg Oral Daily Haskel Schroeder, MD      . carvedilol (COREG) tablet 25 mg  25 mg Oral BID WC Haskel Schroeder, MD   25 mg at 03/24/20 1730  . ceFAZolin (ANCEF) IVPB 1 g/50 mL premix  1 g Intravenous Q8H Haskel Schroeder, MD 100 mL/hr at 03/25/20 0514 1 g at 03/25/20 0514  . Chlorhexidine Gluconate Cloth 2 % PADS 6 each  6 each Topical Daily Ardis Hughs, MD      . docusate sodium (COLACE) capsule 100 mg  100 mg Oral BID Haskel Schroeder, MD   100 mg at 03/24/20 2230  . gentamicin (GARAMYCIN) 400 mg in dextrose 5 % 100 mL IVPB  400 mg Intravenous Q24H Ardis Hughs, MD 110 mL/hr at 03/25/20 0722 400 mg at 03/25/20 2355  . hydrALAZINE (APRESOLINE) tablet 100 mg  100 mg Oral Q8H Haskel Schroeder, MD   100 mg at 03/25/20 7322  . insulin aspart (novoLOG) injection 0-20 Units  0-20 Units Subcutaneous TID WC Haskel Schroeder, MD   7 Units at 03/24/20 2228  . mexiletine (MEXITIL) capsule 300 mg  300 mg Oral Q12H Haskel Schroeder, MD   300 mg at 03/24/20 1729  . morphine 2 MG/ML injection 2-4 mg  2-4 mg Intravenous Q2H PRN Haskel Schroeder, MD   2 mg at 03/24/20 2230  . oxyCODONE (Oxy IR/ROXICODONE) immediate release tablet 5 mg  5 mg Oral Q4H PRN Haskel Schroeder, MD      . senna [facility]) tablet 8.6 mg  1 tablet Oral BID Haskel Schroeder, MD   8.6 mg at 03/24/20 2230  . torsemide (DEMADEX) tablet 40 mg  40 mg Oral BID Haskel Schroeder, MD   40 mg at 03/24/20 1730     Objective: Vital: Vitals:   03/24/20 1731 03/24/20 2101 03/25/20 0021 03/25/20 0410  BP: 139/87 (!) 124/59 (!) 118/56 (!) 141/72  Pulse: 64 71 74 86  Resp: 17 18 18 18   Temp: (!) 97.5 F (36.4 C) 98.4 F (36.9 C) 98.6 F (37 C) 98.7 F (37.1 C)  TempSrc:  Oral Oral Oral  SpO2: 99% 98% 97% 96%  Weight:      Height:       I/Os: I/O last 3 completed shifts: In: 2190 [P.O.:480; I.V.:1450; IV Piggyback:260] Out: 750 [Urine:700; Blood:50]  Physical Exam:  General: Patient is in no apparent distress Lungs: Normal respiratory effort, chest expands symmetrically. GI: Incisions are c/d/i. The abdomen is soft and nontender without mass. Foley: blood tinged  Ext: lower extremities symmetric  Lab Results: Recent Labs    03/24/20 1206 03/25/20 0517  HGB 11.7* 9.8*  HCT 37.7 30.6*   Recent Labs    03/25/20 0517  NA 138  K 4.6  CL 104  CO2 25  GLUCOSE  139*  BUN 39*  CREATININE 2.43*  CALCIUM 9.0   No results for input(s): LABPT, INR in the last 72 hours. No results for input(s): LABURIN in the last 72 hours. Results for orders placed or performed during the hospital encounter of 03/20/20  SARS CORONAVIRUS 2 (TAT 6-24 HRS) Nasopharyngeal Nasopharyngeal Swab     Status: None   Collection Time: 03/20/20  2:49 PM   Specimen: Nasopharyngeal Swab  Result Value Ref Range Status   SARS Coronavirus 2 NEGATIVE NEGATIVE Final    Comment: (NOTE) SARS-CoV-2 target nucleic acids are NOT DETECTED. The SARS-CoV-2 RNA is generally detectable in upper and lower respiratory specimens during the acute phase of infection. Negative results do not preclude SARS-CoV-2 infection, do not rule out co-infections with other pathogens, and should not be used as the sole basis for treatment or other patient management decisions. Negative results must  be combined with clinical observations, patient history, and epidemiological information. The expected result is Negative. Fact Sheet for Patients: SugarRoll.be Fact Sheet for Healthcare Providers: https://www.woods-mathews.com/ This test is not yet approved or cleared by the [place] FDA and  has been authorized for detection and/or diagnosis of SARS-CoV-2 by FDA under an Emergency Use Authorization (EUA). This EUA will remain  in effect (meaning this test can be used) for the duration of the COVID-19 declaration under Section 56 4(b)(1) of the Act, 21 U.S.C. section 360bbb-3(b)(1), unless the authorization is terminated or revoked sooner. Performed at [facility], [place] [address] [place], [place] [address]     Studies/Results: CT ABDOMEN WO CONTRAST  Result Date: 03/25/2020 CLINICAL DATA:  evaluate for residual kidney stones. Status post right percutaneous nephrostomy and left ureteral stent removal. EXAM: CT ABDOMEN WITHOUT CONTRAST TECHNIQUE: Multidetector CT imaging of the abdomen was performed following the standard protocol without IV contrast. COMPARISON:  11/07/2019 FINDINGS: Lower chest: Subsegmental atelectasis identified within the lung bases. Hepatobiliary: No suspicious liver abnormality. Status post cholecystectomy. Fusiform dilatation of the CBD is again noted measuring up to 11 mm. Pancreas: Unremarkable. No pancreatic ductal dilatation or surrounding inflammatory changes. Spleen: Normal in size without focal abnormality. Adrenals/Urinary Tract: Normal appearance of the adrenal glands. Decrease in stone burden within the right renal collecting system. Right-sided nephroureteral stent is identified. The proximal pigtail is at the level of the right UPJ. Air is identified within the mildly dilated right renal collecting system. Small perinephric hematoma noted around the inferior pole of the right kidney measuring 3.9 x 1.5 cm,  image 50/2. There are 5 residual calcifications identified within the mid and lower pole collecting system of the right kidney which measure up to 6 mm. Several stone fragments are identified posterior to the inferior pole of the right kidney measuring up to 3 mm, image 51/2. Interval decrease in overall stone burden within the left renal collecting system. Multiple large stone fragments remain within the mid and inferior pole collecting systems of the left kidney. The largest is in the lower pole measuring 1.5 cm, image 59/4. Cluster of stone fragments measuring up to 1.1 cm noted within the mid collecting system of the left kidney. Multiple tiny stone fragments are identified along the previous percutaneous nephrostomy tract, image 34/2. Stomach/Bowel: Stomach is within normal limits. Appendix appears normal. No evidence of bowel wall thickening, distention, or inflammatory changes. Vascular/Lymphatic: Mild aortic atherosclerosis without aneurysm. Small retroperitoneal lymph nodes are identified, likely reactive. Other: Midline ventral abdominal wall umbilical hernia is identified which contains fat and fluid. The very large  fibroid uterus is again noted. Free fluid is noted extending along the right pericolic gutter. Musculoskeletal: No acute or significant osseous findings. IMPRESSION: 1. Significant interval decrease in stone burden within both renal collecting systems. 2. Residual stones are identified within both collecting systems, left greater than right. Additionally, tiny stone fragments are noted within the retroperitoneal fat posterior to the inferior pole of right kidney and along the left sided percutaneous nephrostomy tract 3. Right-sided nephroureteral stent is in place with proximal pigtail at the level of the UPJ. 4. Small perinephric hematoma is identified around the inferior pole of the right kidney. Aortic Atherosclerosis (ICD10-I70.0). Electronically Signed   By: Kerby Moors M.D.   On:  03/25/2020 08:54   DG C-Arm 1-60 Min-No Report  Result Date: 03/24/2020 Fluoroscopy was utilized by the requesting physician.  No radiographic interpretation.    Assessment: Procedure(s): NEPHROLITHOTOMY PERCUTANEOUS WITH ACCESS LEFT STENT REMOVAL, 1 Day Post-Op  doing well.  Small residual fragments on CT, small perinephric hemorrhage  Plan: ADAT ORal pain meds Home at lunch   Louis Meckel, MD Urology 03/25/2020, 9:41 AM

## 2020-03-25 NOTE — H&P (View-Only) (Signed)
Urology Inpatient Progress Report  Nephrolithiasis [N20.0]  Procedure(s): NEPHROLITHOTOMY PERCUTANEOUS WITH ACCESS LEFT STENT REMOVAL  1 Day Post-Op   Intv/Subj: No acute events overnight. Patient is without complaint.  Active Problems:   Nephrolithiasis  Current Facility-Administered Medications  Medication Dose Route Frequency Provider Last Rate Last Admin  . 0.9 % NaCl with KCl 20 mEq/ L  infusion   Intravenous Continuous Haskel Schroeder, MD 50 mL/hr at 03/24/20 1520 New Bag at 03/24/20 1520  . acetaminophen (TYLENOL) tablet 650 mg  650 mg Oral Q4H PRN Haskel Schroeder, MD      . amLODipine (NORVASC) tablet 5 mg  5 mg Oral Daily Haskel Schroeder, MD      . carvedilol (COREG) tablet 25 mg  25 mg Oral BID WC Haskel Schroeder, MD   25 mg at 03/24/20 1730  . ceFAZolin (ANCEF) IVPB 1 g/50 mL premix  1 g Intravenous Q8H Haskel Schroeder, MD 100 mL/hr at 03/25/20 0514 1 g at 03/25/20 0514  . Chlorhexidine Gluconate Cloth 2 % PADS 6 each  6 each Topical Daily Ardis Hughs, MD      . docusate sodium (COLACE) capsule 100 mg  100 mg Oral BID Haskel Schroeder, MD   100 mg at 03/24/20 2230  . gentamicin (GARAMYCIN) 400 mg in dextrose 5 % 100 mL IVPB  400 mg Intravenous Q24H Ardis Hughs, MD 110 mL/hr at 03/25/20 0722 400 mg at 03/25/20 6283  . hydrALAZINE (APRESOLINE) tablet 100 mg  100 mg Oral Q8H Haskel Schroeder, MD   100 mg at 03/25/20 6629  . insulin aspart (novoLOG) injection 0-20 Units  0-20 Units Subcutaneous TID WC Haskel Schroeder, MD   7 Units at 03/24/20 2228  . mexiletine (MEXITIL) capsule 300 mg  300 mg Oral Q12H Haskel Schroeder, MD   300 mg at 03/24/20 1729  . morphine 2 MG/ML injection 2-4 mg  2-4 mg Intravenous Q2H PRN Haskel Schroeder, MD   2 mg at 03/24/20 2230  . oxyCODONE (Oxy IR/ROXICODONE) immediate release tablet 5 mg  5 mg Oral Q4H PRN Haskel Schroeder, MD      . senna [facility]) tablet 8.6 mg  1 tablet Oral BID Haskel Schroeder, MD   8.6 mg at 03/24/20 2230  . torsemide (DEMADEX) tablet 40 mg  40 mg Oral BID Haskel Schroeder, MD   40 mg at 03/24/20 1730     Objective: Vital: Vitals:   03/24/20 1731 03/24/20 2101 03/25/20 0021 03/25/20 0410  BP: 139/87 (!) 124/59 (!) 118/56 (!) 141/72  Pulse: 64 71 74 86  Resp: 17 18 18 18   Temp: (!) 97.5 F (36.4 C) 98.4 F (36.9 C) 98.6 F (37 C) 98.7 F (37.1 C)  TempSrc:  Oral Oral Oral  SpO2: 99% 98% 97% 96%  Weight:      Height:       I/Os: I/O last 3 completed shifts: In: 2190 [P.O.:480; I.V.:1450; IV Piggyback:260] Out: 750 [Urine:700; Blood:50]  Physical Exam:  General: Patient is in no apparent distress Lungs: Normal respiratory effort, chest expands symmetrically. GI: Incisions are c/d/i. The abdomen is soft and nontender without mass. Foley: blood tinged  Ext: lower extremities symmetric  Lab Results: Recent Labs    03/24/20 1206 03/25/20 0517  HGB 11.7* 9.8*  HCT 37.7 30.6*   Recent Labs    03/25/20 0517  NA 138  K 4.6  CL 104  CO2 25  GLUCOSE  139*  BUN 39*  CREATININE 2.43*  CALCIUM 9.0   No results for input(s): LABPT, INR in the last 72 hours. No results for input(s): LABURIN in the last 72 hours. Results for orders placed or performed during the hospital encounter of 03/20/20  SARS CORONAVIRUS 2 (TAT 6-24 HRS) Nasopharyngeal Nasopharyngeal Swab     Status: None   Collection Time: 03/20/20  2:49 PM   Specimen: Nasopharyngeal Swab  Result Value Ref Range Status   SARS Coronavirus 2 NEGATIVE NEGATIVE Final    Comment: (NOTE) SARS-CoV-2 target nucleic acids are NOT DETECTED. The SARS-CoV-2 RNA is generally detectable in upper and lower respiratory specimens during the acute phase of infection. Negative results do not preclude SARS-CoV-2 infection, do not rule out co-infections with other pathogens, and should not be used as the sole basis for treatment or other patient management decisions. Negative results must  be combined with clinical observations, patient history, and epidemiological information. The expected result is Negative. Fact Sheet for Patients: SugarRoll.be Fact Sheet for Healthcare Providers: https://www.woods-mathews.com/ This test is not yet approved or cleared by the [place] FDA and  has been authorized for detection and/or diagnosis of SARS-CoV-2 by FDA under an Emergency Use Authorization (EUA). This EUA will remain  in effect (meaning this test can be used) for the duration of the COVID-19 declaration under Section 56 4(b)(1) of the Act, 21 U.S.C. section 360bbb-3(b)(1), unless the authorization is terminated or revoked sooner. Performed at [facility], [place] [address], [place], [place] [address]     Studies/Results: CT ABDOMEN WO CONTRAST  Result Date: 03/25/2020 CLINICAL DATA:  evaluate for residual kidney stones. Status post right percutaneous nephrostomy and left ureteral stent removal. EXAM: CT ABDOMEN WITHOUT CONTRAST TECHNIQUE: Multidetector CT imaging of the abdomen was performed following the standard protocol without IV contrast. COMPARISON:  11/07/2019 FINDINGS: Lower chest: Subsegmental atelectasis identified within the lung bases. Hepatobiliary: No suspicious liver abnormality. Status post cholecystectomy. Fusiform dilatation of the CBD is again noted measuring up to 11 mm. Pancreas: Unremarkable. No pancreatic ductal dilatation or surrounding inflammatory changes. Spleen: Normal in size without focal abnormality. Adrenals/Urinary Tract: Normal appearance of the adrenal glands. Decrease in stone burden within the right renal collecting system. Right-sided nephroureteral stent is identified. The proximal pigtail is at the level of the right UPJ. Air is identified within the mildly dilated right renal collecting system. Small perinephric hematoma noted around the inferior pole of the right kidney measuring 3.9 x 1.5 cm,  image 50/2. There are 5 residual calcifications identified within the mid and lower pole collecting system of the right kidney which measure up to 6 mm. Several stone fragments are identified posterior to the inferior pole of the right kidney measuring up to 3 mm, image 51/2. Interval decrease in overall stone burden within the left renal collecting system. Multiple large stone fragments remain within the mid and inferior pole collecting systems of the left kidney. The largest is in the lower pole measuring 1.5 cm, image 59/4. Cluster of stone fragments measuring up to 1.1 cm noted within the mid collecting system of the left kidney. Multiple tiny stone fragments are identified along the previous percutaneous nephrostomy tract, image 34/2. Stomach/Bowel: Stomach is within normal limits. Appendix appears normal. No evidence of bowel wall thickening, distention, or inflammatory changes. Vascular/Lymphatic: Mild aortic atherosclerosis without aneurysm. Small retroperitoneal lymph nodes are identified, likely reactive. Other: Midline ventral abdominal wall umbilical hernia is identified which contains fat and fluid. The very large  fibroid uterus is again noted. Free fluid is noted extending along the right pericolic gutter. Musculoskeletal: No acute or significant osseous findings. IMPRESSION: 1. Significant interval decrease in stone burden within both renal collecting systems. 2. Residual stones are identified within both collecting systems, left greater than right. Additionally, tiny stone fragments are noted within the retroperitoneal fat posterior to the inferior pole of right kidney and along the left sided percutaneous nephrostomy tract 3. Right-sided nephroureteral stent is in place with proximal pigtail at the level of the UPJ. 4. Small perinephric hematoma is identified around the inferior pole of the right kidney. Aortic Atherosclerosis (ICD10-I70.0). Electronically Signed   By: Kerby Moors M.D.   On:  03/25/2020 08:54   DG C-Arm 1-60 Min-No Report  Result Date: 03/24/2020 Fluoroscopy was utilized by the requesting physician.  No radiographic interpretation.    Assessment: Procedure(s): NEPHROLITHOTOMY PERCUTANEOUS WITH ACCESS LEFT STENT REMOVAL, 1 Day Post-Op  doing well.  Small residual fragments on CT, small perinephric hemorrhage  Plan: ADAT ORal pain meds Home at lunch   Louis Meckel, MD Urology 03/25/2020, 9:41 AM

## 2020-03-25 NOTE — Discharge Summary (Signed)
Date of admission: 03/24/2020  Date of discharge: 03/25/2020  Admission diagnosis: Nephrolithiasis  Discharge diagnosis: Nephrolithiasis   History and Physical: For full details, please see admission history and physical. Briefly,Adrienne Vereenis a53 y.o.female with bilateral nephrolithiasis presenting for right PCNL and left ureteral stent placement. After discussing management/treatment options, she elected to proceed with surgical treatment.  Hospital Course:Adrienne Vereenwas taken to the operating room on3/9/2021and underwent a right PCNL and left ureteral stent removal.She tolerated this procedure well and without complications. Postoperatively,she was able to be transferred to a regular hospital room following recovery from anesthesia.She was able to begin ambulating the night of surgery.She remained hemodynamically stable overnight.She had excellent urine outputand foley was removed onPOD #1. Her pain was well controlled,she tolerated a diet, and had met all discharge criteria and was able to be discharged home later on POD#1.CT scan prior to discharge demonstrated small residual right stone burden and moderate residual left stone burden. She is being discharged with a RIGHT ureteral stent in place.     Laboratory values:  Recent Labs    03/24/20 1206 03/25/20 0517  HGB 11.7* 9.8*  HCT 37.7 30.6*    Physical Exam: General: Alert and oriented. CV: Regular rate Lungs:Normal work of breathing GI: Soft, Nondistended. Incisions: Clean, healing well.  GU: clear dark pink urine, no clots  Disposition: Home  Discharge instruction: Reviewed with patient. Advised to continue Augmentin at home until next surgery. Advised to hold Eliquis 5 days prior to next procedure as we have done before.   Discharge medications:   Allergies as of 03/25/2020      Reactions   Adhesive [tape]    Tears skin, can tolerate paper tape      Medication List    STOP taking these  medications   clindamycin 300 MG capsule Commonly known as: CLEOCIN     TAKE these medications   amLODipine 5 MG tablet Commonly known as: NORVASC Take 1 tablet (5 mg total) by mouth daily.   apixaban 5 MG Tabs tablet Commonly known as: ELIQUIS Take 1 tablet (5 mg total) by mouth 2 (two) times daily. Resume when bleeding has stopped in your urine. What changed: additional instructions   carvedilol 25 MG tablet Commonly known as: COREG Take 1 tablet (25 mg total) by mouth 2 (two) times daily with a meal.   ferrous sulfate 325 (65 FE) MG tablet Take 1 tablet (325 mg total) by mouth daily with breakfast.   glucose blood test strip Use as instructed   hydrALAZINE 100 MG tablet Commonly known as: APRESOLINE Take 1 tablet (100 mg total) by mouth every 8 (eight) hours.   insulin glargine 100 UNIT/ML injection Commonly known as: LANTUS Inject 15 Units into the skin every morning.   INSULIN SYRINGE .5CC/30GX1/2" 30G X 1/2" 0.5 ML Misc 12 Units by Does not apply route at bedtime.   isosorbide mononitrate 60 MG 24 hr tablet Commonly known as: IMDUR Take 1 tablet (60 mg total) by mouth daily.   mexiletine 150 MG capsule Commonly known as: MEXITIL Take 2 capsules (300 mg total) by mouth every 12 (twelve) hours.   onetouch ultrasoft lancets Check CBG twice a day   potassium chloride 10 MEQ tablet Commonly known as: KLOR-CON Take 4 tablets (40 mEq total) by mouth daily.   torsemide 20 MG tablet Commonly known as: DEMADEX Take 2 tablets (40 mg total) by mouth 2 (two) times daily.   traMADol 50 MG tablet Commonly known as: Ultram Take 1-2 tablets (50-100  mg total) by mouth every 6 (six) hours as needed for moderate pain.   WOMENS MULTI PO Take 1 tablet by mouth daily.       Followup: She will follow up as scheduled for next procedure. We will plan on bilateral ureteroscopy and likely right ureteral stent removal.

## 2020-03-26 ENCOUNTER — Telehealth: Payer: Self-pay

## 2020-03-26 NOTE — Telephone Encounter (Signed)
Transition Care Management Follow-up Telephone Call  Date of discharge and from where:03/25/2020 [address]rien[address]   How have you been since you were released from the hospital? She said that she is much better / taking her meds for pain   Any questions or concerns? no questions/concerns at this time.   Items Reviewed:  Did the pt receive and understand the discharge instructions provided?  yes she has the instructions and she has no questions.   Medications obtained and verified?   Stated she is well aware of all her med and instructions. Pas pain medication for now. Will pick up the rest of meds today from the pharmacy.  Any new allergies since your discharge?   None reported   Do you have support at home? yes  Other (ie: DME, Home Health, etc) no home health ordered.  Left ureteral stent removal.  Placement of right ureteral stent. No urgency or hesitancy voiding.    She has a glucometer at home  Functional Questionnaire: (I = Independent and D = Dependent) ADL's: independent   Follow up appointments reviewed:    PCP Hospital f/u appt confirmed? .  Appointment with Dr Juleen China  03/31/2020 @10 :[facility] f/u appt confirmed?  appt with Dr Louis Meckel on 04/03/2020 and Norwood Levo MD[facility] 06/02/2020  If their condition worsens, is the pt aware to call  their PCP or go to the ED?   yes  Was the patient provided with contact information for the PCP's office or ED?  She has the clinic phone number  Was the pt encouraged to call back with questions or concerns? yes Avoid straining or any acctivity to cause Chest pain Diff breathing , SOB.  Call MD if any of this occurs : 3-4 pound weight gain in 1-2 days or 2 pounds overnight Shortness of breath, with or without a dry hacking cough Swelling in the hands, feet or stomach If you have to sleep on extra pillows at night in order to breathe Verbalized understanding

## 2020-03-26 NOTE — Telephone Encounter (Signed)
Duplicate/ already in [employer]

## 2020-03-27 ENCOUNTER — Encounter (HOSPITAL_COMMUNITY): Payer: Self-pay | Admitting: Urology

## 2020-03-28 LAB — CALCULI, WITH PHOTOGRAPH (CLINICAL LAB)
Carbonate Apatite: 40 %
Mg NH4 PO4 (Struvite): 60 %
Weight Calculi: 58 mg

## 2020-03-31 ENCOUNTER — Other Ambulatory Visit (HOSPITAL_COMMUNITY)
Admission: RE | Admit: 2020-03-31 | Discharge: 2020-03-31 | Disposition: A | Discharge status: Home / Self Care | Payer: HRSA Program | Source: Ambulatory Visit | Attending: Urology | Admitting: Urology

## 2020-03-31 DIAGNOSIS — Z20822 Contact with and (suspected) exposure to covid-19: Secondary | ICD-10-CM | POA: Insufficient documentation

## 2020-03-31 DIAGNOSIS — Z01812 Encounter for preprocedural laboratory examination: Secondary | ICD-10-CM | POA: Diagnosis present

## 2020-03-31 LAB — SARS CORONAVIRUS 2 (TAT 6-24 HRS): SARS Coronavirus 2: NEGATIVE

## 2020-03-31 MED FILL — CARVEDILOL 25 MG TABLET: 25 | 30 days supply | Qty: 60 | Fill #1

## 2020-03-31 MED FILL — TORSEMIDE 20 MG TABLET: 20 | 30 days supply | Qty: 120 | Fill #3

## 2020-03-31 MED FILL — POTASSIUM CHLORIDE ER 10 ME: 10 | 30 days supply | Qty: 120 | Fill #1

## 2020-03-31 MED FILL — MEXILETINE HCL 150 MG CAPS: 150 | 30 days supply | Qty: 120 | Fill #3

## 2020-04-01 ENCOUNTER — Ambulatory Visit: Payer: Self-pay

## 2020-04-01 ENCOUNTER — Encounter: Payer: Self-pay | Admitting: Internal Medicine

## 2020-04-01 ENCOUNTER — Telehealth (INDEPENDENT_AMBULATORY_CARE_PROVIDER_SITE_OTHER): Payer: Self-pay | Admitting: Internal Medicine

## 2020-04-01 DIAGNOSIS — Z9889 Other specified postprocedural states: Secondary | ICD-10-CM

## 2020-04-01 DIAGNOSIS — Z87442 Personal history of urinary calculi: Secondary | ICD-10-CM

## 2020-04-01 DIAGNOSIS — Z09 Encounter for follow-up examination after completed treatment for conditions other than malignant neoplasm: Secondary | ICD-10-CM

## 2020-04-01 NOTE — Progress Notes (Signed)
Virtual Visit via Telephone Note  I connected with Adrienne Lane, on 04/01/2020 at 10:49 AM by telephone due to the COVID-19 pandemic and verified that I am speaking with the correct person using two identifiers.   Consent: I discussed the limitations, risks, security and privacy concerns of performing an evaluation and management service by telephone and the availability of in person appointments. I also discussed with the patient that there may be a patient responsible charge related to this service. The patient expressed understanding and agreed to proceed.   Location of Patient: Home   Location of Provider: Clinic    Persons participating in Telemedicine visit: Me[facility] Dr. Juleen China      History of Present Illness: Patient has visit for hospital f/u. Patient reports she is feeling well since her procedure. No concerns about her incisions. She has no issues with urination. She is still taking Augmentin and will continue through her next procedure, which is scheduled for 4/15. She is holding her Eliquis currently in anticipation of her surgery.   Hospital Course:Adrienne Vereenwas taken to the operating room on 4/5/2021and underwent a right PCNL and left ureteral stent removal.She tolerated this procedure well and without complications. Postoperatively,she was able to be transferred to a regular hospital room following recovery from anesthesia.She was able to begin ambulating the night of surgery.She remained hemodynamically stable overnight.She had excellent urine outputand foley was removed onPOD #1.Her pain was well controlled,she tolerated a diet, and had met all discharge criteria and was able to be discharged home later on POD#1.CT scan prior to discharge demonstrated small residual right stone burden and moderate residual left stone burden. She is being discharged with a RIGHT ureteral stent in place.   Followup: She will follow up as  scheduled for next   procedure. We will plan on bilateral ureteroscopy   and likely right ureteral stent removal.    Past Medical History:  Diagnosis Date  . Atrial fibrillation with RVR ([place])   . Bigeminy 01/2020  . CHF (congestive heart failure) ([place]) 10/20/2019  . CKD (chronic kidney disease), stage IV ([place])   . Diabetes mellitus without complication ([place])   . DOE (dyspnea on exertion)    walking upstairs or up hill resolves in one minute  . Dyspnea    with activity  . Fibroids   . History of kidney stones   . History of recent blood transfusion 02/26/2020  . Hypertension   . Iron deficiency anemia   . Non-ischemic cardiomyopathy ([facility])    tachycardia induced  . Obese   . Peripheral edema   . Premature ventricular contractions (PVCs) ([facility])   . Umbilical hernia   . Umbilical hernia   . Wears glasses    Allergies  Allergen Reactions  . Adhesive [Tape]     Tears skin, can tolerate paper tape    Current Outpatient Medications on File Prior to Visit  Medication Sig Dispense Refill  . amLODipine (NORVASC) 5 MG tablet Take 1 tablet (5 mg total) by mouth daily. 30 tablet 6  . amoxicillin-clavulanate (AUGMENTIN) 875-125 MG tablet Take 1 tablet by mouth 2 (two) times daily.    Marland Kitchen apixaban (ELIQUIS) 5 MG TABS tablet Take 1 tablet (5 mg total) by mouth 2 (two) times daily. Resume when bleeding has stopped in your urine. 60 tablet 6  . carvedilol (COREG) 25 MG tablet Take 1 tablet (25 mg total) by mouth 2 (two) times daily with a meal. 180 tablet 3  . ferrous sulfate  325 (65 FE) MG tablet Take 1 tablet (325 mg total) by mouth daily with breakfast. 30 tablet 3  . glucose blood test strip Use as instructed 100 each 12  . hydrALAZINE (APRESOLINE) 100 MG tablet Take 1 tablet (100 mg total) by mouth every 8 (eight) hours. 90 tablet 6  . insulin glargine (LANTUS) 100 UNIT/ML injection Inject 15 Units into the skin every morning.     . isosorbide mononitrate (IMDUR) 60 MG 24 hr tablet Take 1  tablet (60 mg total) by mouth daily. 30 tablet 6  . Lancets (ONETOUCH ULTRASOFT) lancets Check CBG twice a day 60 each 5  . mexiletine (MEXITIL) 150 MG capsule Take 2 capsules (300 mg total) by mouth every 12 (twelve) hours. 120 capsule 6  . Multiple Vitamins-Minerals (WOMENS MULTI PO) Take 1 tablet by mouth daily.    . potassium chloride (KLOR-CON) 10 MEQ tablet Take 4 tablets (40 mEq total) by mouth daily. 120 tablet 6  . tamsulosin (FLOMAX) 0.4 MG CAPS capsule Take 0.4 mg by mouth daily.    Marland Kitchen torsemide (DEMADEX) 20 MG tablet Take 2 tablets (40 mg total) by mouth 2 (two) times daily. 120 tablet 6  . traMADol (ULTRAM) 50 MG tablet Take 1-2 tablets (50-100 mg total) by mouth every 6 (six) hours as needed for moderate pain. 15 tablet 0  . TRUEPLUS INSULIN SYRINGE 30G X 5/16" 0.5 ML MISC USE TO INJECT 12 UNITS AT BEDTIME.     No current facility-administered medications on file prior to visit.    Observations/Objective: NAD. Speaking clearly.  Work of breathing normal.  Alert and oriented. Mood appropriate.   Assessment and Plan: 1. Hospital discharge follow-up Patient doing well. Is compliant with abx without side effects. She is holding anticoagulation appropriately for next procedure 4/15 for right uretal stent removal and bilateral ureteroscopy.   2. H/O nephrolithotomy with removal of calculi    Follow Up Instructions: After next procedure performed    I discussed the assessment and treatment plan with the patient. The patient was provided an opportunity to ask questions and all were answered. The patient agreed with the plan and demonstrated an understanding of the instructions.   The patient was advised to call back or seek an in-person evaluation if the symptoms worsen or if the condition fails to improve as anticipated.     I provided 10 minutes total of non-face-to-face time during this encounter including median intraservice time, reviewing previous notes, investigations,  ordering medications, medical decision making, coordinating care and patient verbalized understanding at the end of the visit.    Phill Myron, D.O. Primary Care at [facility]  04/01/2020, 10:49 AM

## 2020-04-02 MED ORDER — GENTAMICIN SULFATE 40 MG/ML IJ SOLN
5.0000 mg/kg | INTRAVENOUS | Status: AC
  Administered 2020-04-03: 08:00:00 400 mg via INTRAVENOUS
  Filled 2020-04-02: qty 10

## 2020-04-02 NOTE — Anesthesia Preprocedure Evaluation (Addendum)
Anesthesia Evaluation  Patient identified by MRN, date of birth, ID band Patient awake    Reviewed: Allergy & Precautions, NPO status , Patient's Chart, lab work & pertinent test results  Airway Mallampati: II  TM Distance: >3 FB Neck ROM: Full    Dental no notable dental hx.    Pulmonary neg pulmonary ROS,    Pulmonary exam normal breath sounds clear to auscultation       Cardiovascular hypertension, Normal cardiovascular exam Rhythm:Regular Rate:Normal  1. Inferobasal hypokinesis EF hard to judge as patient in bigeminal  rhythm most of study No starin imaging done . Left ventricular ejection  fraction, by estimation, is 50 to 55%. The left ventricle has low normal  function. The left ventricle  demonstrates regional wall motion abnormalities (see scoring  diagram/findings for description). The left ventricular internal cavity  size was mildly dilated. There is mild left ventricular hypertrophy. Left  ventricular diastolic parameters are  indeterminate.  2. Right ventricular systolic function is normal. The right ventricular  size is normal. There is mildly elevated pulmonary artery systolic  pressure.  3. Left atrial size was moderately dilated.  4. The mitral valve is normal in structure and function. Mild mitral  valve regurgitation.  5. Tricuspid valve regurgitation is mild to moderate.  6. The aortic valve is tricuspid. Aortic valve regurgitation is trivial.  Mild aortic valve sclerosis is present, with no evidence of aortic valve  stenosis.  7. The inferior vena cava is dilated in size with >50% respiratory  variability, suggesting right atrial pressure of 8 mmHg.    Neuro/Psych negative neurological ROS  negative psych ROS   GI/Hepatic negative GI ROS, Neg liver ROS,   Endo/Other  diabetes  Renal/GU Renal InsufficiencyRenal disease  negative genitourinary   Musculoskeletal negative musculoskeletal  ROS (+)   Abdominal   Peds negative pediatric ROS (+)  Hematology  (+) anemia ,   Anesthesia Other Findings   Reproductive/Obstetrics negative OB ROS                            Anesthesia Physical Anesthesia Plan  ASA: III  Anesthesia Plan: General   Post-op Pain Management:    Induction: Intravenous  PONV Risk Score and Plan: 3 and Ondansetron, Dexamethasone and Treatment may vary due to age or medical condition  Airway Management Planned: LMA  Additional Equipment:   Intra-op Plan:   Post-operative Plan: Extubation in OR  Informed Consent: I have reviewed the patients History and Physical, chart, labs and discussed the procedure including the risks, benefits and alternatives for the proposed anesthesia with the patient or authorized representative who has indicated his/her understanding and acceptance.     Dental advisory given  Plan Discussed with: CRNA and Surgeon  Anesthesia Plan Comments:        Anesthesia Quick Evaluation

## 2020-04-03 ENCOUNTER — Ambulatory Visit (HOSPITAL_COMMUNITY): Payer: Self-pay | Admitting: Physician Assistant

## 2020-04-03 ENCOUNTER — Ambulatory Visit (HOSPITAL_COMMUNITY): Payer: Self-pay

## 2020-04-03 ENCOUNTER — Encounter (HOSPITAL_COMMUNITY): Payer: Self-pay | Admitting: Urology

## 2020-04-03 ENCOUNTER — Ambulatory Visit (HOSPITAL_COMMUNITY): Payer: Self-pay | Admitting: Certified Registered"

## 2020-04-03 ENCOUNTER — Other Ambulatory Visit: Payer: Self-pay

## 2020-04-03 ENCOUNTER — Ambulatory Visit (HOSPITAL_COMMUNITY)
Admission: RE | Admit: 2020-04-03 | Discharge: 2020-04-03 | Disposition: A | Discharge status: Home / Self Care | Payer: Self-pay | Source: Ambulatory Visit | Attending: Urology | Admitting: Urology

## 2020-04-03 ENCOUNTER — Encounter (HOSPITAL_COMMUNITY)
Admission: RE | Disposition: A | Discharge status: Home / Self Care | Payer: Self-pay | Source: Ambulatory Visit | Attending: Urology

## 2020-04-03 DIAGNOSIS — Z87442 Personal history of urinary calculi: Secondary | ICD-10-CM | POA: Insufficient documentation

## 2020-04-03 DIAGNOSIS — N200 Calculus of kidney: Secondary | ICD-10-CM

## 2020-04-03 DIAGNOSIS — E119 Type 2 diabetes mellitus without complications: Secondary | ICD-10-CM | POA: Insufficient documentation

## 2020-04-03 DIAGNOSIS — N132 Hydronephrosis with renal and ureteral calculous obstruction: Secondary | ICD-10-CM | POA: Insufficient documentation

## 2020-04-03 DIAGNOSIS — I10 Essential (primary) hypertension: Secondary | ICD-10-CM | POA: Insufficient documentation

## 2020-04-03 HISTORY — PX: CYSTOSCOPY/RETROGRADE/URETEROSCOPY: SHX5316

## 2020-04-03 LAB — CBC
HCT: 28.4 % — ABNORMAL LOW (ref 36.0–46.0)
Hemoglobin: 9.3 g/dL — ABNORMAL LOW (ref 12.0–15.0)
MCH: 29.8 pg (ref 26.0–34.0)
MCHC: 32.7 g/dL (ref 30.0–36.0)
MCV: 91 fL (ref 80.0–100.0)
Platelets: 278 10*3/uL (ref 150–400)
RBC: 3.12 MIL/uL — ABNORMAL LOW (ref 3.87–5.11)
RDW: 13.6 % (ref 11.5–15.5)
WBC: 6.8 10*3/uL (ref 4.0–10.5)
nRBC: 0 % (ref 0.0–0.2)

## 2020-04-03 LAB — BASIC METABOLIC PANEL
Anion gap: 10 (ref 5–15)
BUN: 28 mg/dL — ABNORMAL HIGH (ref 6–20)
CO2: 25 mmol/L (ref 22–32)
Calcium: 8.9 mg/dL (ref 8.9–10.3)
Chloride: 106 mmol/L (ref 98–111)
Creatinine, Ser: 2.11 mg/dL — ABNORMAL HIGH (ref 0.44–1.00)
GFR calc Af Amer: 30 mL/min — ABNORMAL LOW (ref >60)
GFR calc non Af Amer: 26 mL/min — ABNORMAL LOW (ref >60)
Glucose, Bld: 229 mg/dL — ABNORMAL HIGH (ref 70–99)
Potassium: 3.7 mmol/L (ref 3.5–5.1)
Sodium: 141 mmol/L (ref 135–145)

## 2020-04-03 LAB — GLUCOSE, CAPILLARY
Glucose-Capillary: 201 mg/dL — ABNORMAL HIGH (ref 70–99)
Glucose-Capillary: 220 mg/dL — ABNORMAL HIGH (ref 70–99)

## 2020-04-03 LAB — PREGNANCY, URINE: Preg Test, Ur: NEGATIVE

## 2020-04-03 SURGERY — CYSTOSCOPY/RETROGRADE/URETEROSCOPY
Anesthesia: General | Laterality: Right

## 2020-04-03 MED ORDER — PROPOFOL 10 MG/ML IV BOLUS
INTRAVENOUS | Status: DC | PRN
  Administered 2020-04-03: 140 mg via INTRAVENOUS

## 2020-04-03 MED ORDER — DEXAMETHASONE SODIUM PHOSPHATE 10 MG/ML IJ SOLN
INTRAMUSCULAR | Status: AC
  Filled 2020-04-03: qty 1

## 2020-04-03 MED ORDER — ACETAMINOPHEN 10 MG/ML IV SOLN: 1000.0000 mg | Freq: Once | INTRAVENOUS | Status: DC | PRN

## 2020-04-03 MED ORDER — FENTANYL CITRATE (PF) 250 MCG/5ML IJ SOLN
INTRAMUSCULAR | Status: DC | PRN
  Administered 2020-04-03 (×2): 25 ug via INTRAVENOUS
  Administered 2020-04-03: 50 ug via INTRAVENOUS
  Administered 2020-04-03: 25 ug via INTRAVENOUS

## 2020-04-03 MED ORDER — LACTATED RINGERS IV SOLN: INTRAVENOUS | Status: DC

## 2020-04-03 MED ORDER — IOHEXOL 300 MG/ML  SOLN
INTRAMUSCULAR | Status: DC | PRN
  Administered 2020-04-03: 41 mL

## 2020-04-03 MED ORDER — SODIUM CHLORIDE 0.9 % IR SOLN
Status: DC | PRN
  Administered 2020-04-03: 12000 mL

## 2020-04-03 MED ORDER — LIDOCAINE 2% (20 MG/ML) 5 ML SYRINGE
INTRAMUSCULAR | Status: DC | PRN
  Administered 2020-04-03: 40 mg via INTRAVENOUS

## 2020-04-03 MED ORDER — ONDANSETRON HCL 4 MG/2ML IJ SOLN
INTRAMUSCULAR | Status: AC
  Filled 2020-04-03: qty 2

## 2020-04-03 MED ORDER — LIDOCAINE 2% (20 MG/ML) 5 ML SYRINGE
INTRAMUSCULAR | Status: AC
  Filled 2020-04-03: qty 5

## 2020-04-03 MED ORDER — ROCURONIUM BROMIDE 10 MG/ML (PF) SYRINGE
PREFILLED_SYRINGE | INTRAVENOUS | Status: DC | PRN
  Administered 2020-04-03 (×4): 10 mg via INTRAVENOUS
  Administered 2020-04-03: 50 mg via INTRAVENOUS

## 2020-04-03 MED ORDER — PROPOFOL 10 MG/ML IV BOLUS
INTRAVENOUS | Status: AC
  Filled 2020-04-03: qty 40

## 2020-04-03 MED ORDER — SODIUM CHLORIDE 0.9 % IV SOLN: INTRAVENOUS | Status: DC

## 2020-04-03 MED ORDER — OXYCODONE HCL 5 MG/5ML PO SOLN: 5.0000 mg | Freq: Once | ORAL | Status: DC | PRN

## 2020-04-03 MED ORDER — AMOXICILLIN-POT CLAVULANATE 875-125 MG PO TABS: 1.0000 | ORAL_TABLET | Freq: Two times a day (BID) | ORAL | 0 refills | Status: AC

## 2020-04-03 MED ORDER — DEXAMETHASONE SODIUM PHOSPHATE 10 MG/ML IJ SOLN
INTRAMUSCULAR | Status: DC | PRN
  Administered 2020-04-03: 4 mg via INTRAVENOUS

## 2020-04-03 MED ORDER — FENTANYL CITRATE (PF) 100 MCG/2ML IJ SOLN: 25.0000 ug | INTRAMUSCULAR | Status: DC | PRN

## 2020-04-03 MED ORDER — CEFAZOLIN SODIUM-DEXTROSE 2-4 GM/100ML-% IV SOLN
2.0000 g | INTRAVENOUS | Status: AC
  Administered 2020-04-03: 08:00:00 2 g via INTRAVENOUS
  Filled 2020-04-03: qty 100

## 2020-04-03 MED ORDER — MIDAZOLAM HCL 2 MG/2ML IJ SOLN
INTRAMUSCULAR | Status: DC | PRN
  Administered 2020-04-03 (×2): 1 mg via INTRAVENOUS

## 2020-04-03 MED ORDER — PROMETHAZINE HCL 25 MG/ML IJ SOLN: 6.2500 mg | INTRAMUSCULAR | Status: DC | PRN

## 2020-04-03 MED ORDER — 0.9 % SODIUM CHLORIDE (POUR BTL) OPTIME
TOPICAL | Status: DC | PRN
  Administered 2020-04-03: 1000 mL

## 2020-04-03 MED ORDER — LACTATED RINGERS IV SOLN: INTRAVENOUS | Status: DC | PRN

## 2020-04-03 MED ORDER — FENTANYL CITRATE (PF) 100 MCG/2ML IJ SOLN
INTRAMUSCULAR | Status: AC
  Filled 2020-04-03: qty 2

## 2020-04-03 MED ORDER — PHENYLEPHRINE HCL-NACL 10-0.9 MG/250ML-% IV SOLN
INTRAVENOUS | Status: DC | PRN
  Administered 2020-04-03: 20 ug/min via INTRAVENOUS

## 2020-04-03 MED ORDER — SUGAMMADEX SODIUM 200 MG/2ML IV SOLN
INTRAVENOUS | Status: DC | PRN
  Administered 2020-04-03: 200 mg via INTRAVENOUS

## 2020-04-03 MED ORDER — OXYCODONE HCL 5 MG PO TABS: 5.0000 mg | ORAL_TABLET | Freq: Once | ORAL | Status: DC | PRN

## 2020-04-03 MED ORDER — BUPIVACAINE-EPINEPHRINE (PF) 0.5% -1:200000 IJ SOLN
INTRAMUSCULAR | Status: AC
  Filled 2020-04-03: qty 30

## 2020-04-03 MED ORDER — MIDAZOLAM HCL 2 MG/2ML IJ SOLN
INTRAMUSCULAR | Status: AC
  Filled 2020-04-03: qty 2

## 2020-04-03 MED ORDER — ONDANSETRON HCL 4 MG/2ML IJ SOLN
INTRAMUSCULAR | Status: DC | PRN
  Administered 2020-04-03: 4 mg via INTRAVENOUS

## 2020-04-03 MED ORDER — ROCURONIUM BROMIDE 10 MG/ML (PF) SYRINGE
PREFILLED_SYRINGE | INTRAVENOUS | Status: AC
  Filled 2020-04-03: qty 10

## 2020-04-03 MED FILL — AMOX-CLAV 875-125 MG TABLET: 875-125 | 14 days supply | Qty: 28 | Fill #0

## 2020-04-03 SURGICAL SUPPLY — 60 items
BAG URINE DRAIN 2000ML AR STRL (UROLOGICAL SUPPLIES) IMPLANT
BAG URO CATCHER STRL LF (MISCELLANEOUS) ×3 IMPLANT
BASKET STONE NCOMPASS (UROLOGICAL SUPPLIES) ×1 IMPLANT
BASKET ZERO TIP NITINOL 2.4FR (BASKET) IMPLANT
BENZOIN TINCTURE PRP APPL 2/3 (GAUZE/BANDAGES/DRESSINGS) ×2 IMPLANT
BLADE SURG 15 STRL LF DISP TIS (BLADE) IMPLANT
BLADE SURG 15 STRL SS (BLADE)
CATH AINSWORTH 30CC 24FR (CATHETERS) IMPLANT
CATH IMAGER II 65CM (CATHETERS) IMPLANT
CATH URET 5FR 28IN OPEN ENDED (CATHETERS) ×3 IMPLANT
CATH URET DUAL LUMEN 6-10FR 50 (CATHETERS) ×1 IMPLANT
CATH X-FORCE N30 NEPHROSTOMY (TUBING) IMPLANT
CHLORAPREP W/TINT 26 (MISCELLANEOUS) ×2 IMPLANT
CLOTH BEACON ORANGE TIMEOUT ST (SAFETY) ×2 IMPLANT
COVER WAND RF STERILE (DRAPES) IMPLANT
DRAPE C-ARM 42X120 X-RAY (DRAPES) ×2 IMPLANT
DRAPE LINGEMAN PERC (DRAPES) ×2 IMPLANT
DRSG PAD ABDOMINAL 8X10 ST (GAUZE/BANDAGES/DRESSINGS) ×4 IMPLANT
DRSG TEGADERM 8X12 (GAUZE/BANDAGES/DRESSINGS) IMPLANT
EXTRACTOR STONE 1.7FRX115CM (UROLOGICAL SUPPLIES) ×1 IMPLANT
FIBER LASER FLEXIVA 365 (UROLOGICAL SUPPLIES) IMPLANT
FIBER LASER TRAC TIP (UROLOGICAL SUPPLIES) ×1 IMPLANT
GAUZE SPONGE 4X4 12PLY STRL (GAUZE/BANDAGES/DRESSINGS) ×3 IMPLANT
GLOVE BIOGEL M STRL SZ7.5 (GLOVE) ×5 IMPLANT
GOWN STRL REUS W/TWL XL LVL3 (GOWN DISPOSABLE) ×6 IMPLANT
GUIDEWIRE AMPLAZ .035X145 (WIRE) ×4 IMPLANT
GUIDEWIRE ANG ZIPWIRE 038X150 (WIRE) IMPLANT
GUIDEWIRE STR DUAL SENSOR (WIRE) ×5 IMPLANT
HLDR NDL AMPLATZ W/INSERTS (MISCELLANEOUS) IMPLANT
HOLDER NEEDLE AMPLATZ W/INSERT (MISCELLANEOUS) IMPLANT
IV SET EXTENSION CATH 6 NF (IV SETS) IMPLANT
KIT BASIN OR (CUSTOM PROCEDURE TRAY) ×2 IMPLANT
KIT PROBE 340X3.4XDISP GRN (MISCELLANEOUS) IMPLANT
KIT PROBE TRILOGY 3.4X340 (MISCELLANEOUS)
KIT PROBE TRILOGY 3.9X350 (MISCELLANEOUS) IMPLANT
KIT TURNOVER KIT A (KITS) IMPLANT
MANIFOLD NEPTUNE II (INSTRUMENTS) ×5 IMPLANT
NDL SPNL 20GX3.5 QUINCKE YW (NEEDLE) IMPLANT
NDL TROCAR 18X15 ECHO (NEEDLE) IMPLANT
NDL TROCAR 18X20 (NEEDLE) IMPLANT
NEEDLE SPNL 20GX3.5 QUINCKE YW (NEEDLE) IMPLANT
NEEDLE TROCAR 18X15 ECHO (NEEDLE) IMPLANT
NEEDLE TROCAR 18X20 (NEEDLE) IMPLANT
NS IRRIG 1000ML POUR BTL (IV SOLUTION) ×2 IMPLANT
PACK CYSTO (CUSTOM PROCEDURE TRAY) ×5 IMPLANT
SHEATH PEELAWAY SET 9 (SHEATH) IMPLANT
SHEATH URETERAL 12FRX28CM (UROLOGICAL SUPPLIES) IMPLANT
SHEATH URETERAL 12FRX35CM (MISCELLANEOUS) ×1 IMPLANT
SPONGE LAP 4X18 RFD (DISPOSABLE) ×2 IMPLANT
STENT URET 6FRX26 CONTOUR (STENTS) ×2 IMPLANT
SUT ETHILON 3 0 PS 1 (SUTURE) IMPLANT
SYR 10ML LL (SYRINGE) ×2 IMPLANT
SYR 20ML LL LF (SYRINGE) ×4 IMPLANT
SYR 50ML LL SCALE MARK (SYRINGE) IMPLANT
TOWEL OR 17X26 10 PK STRL BLUE (TOWEL DISPOSABLE) ×2 IMPLANT
TRAY FOLEY MTR SLVR 16FR STAT (SET/KITS/TRAYS/PACK) IMPLANT
TUBING CONNECTING 10 (TUBING) ×5 IMPLANT
TUBING STONE CATCHER TRILOGY (MISCELLANEOUS) IMPLANT
TUBING UROLOGY SET (TUBING) ×5 IMPLANT
WIRE COONS/BENSON .038X145CM (WIRE) IMPLANT

## 2020-04-03 NOTE — Transfer of Care (Signed)
Immediate Anesthesia Transfer of Care Note  Patient: Adrienne Lane  Procedure(s) Performed: CYSTOSCOPY/RETROGRADE/URETEROSCOPY (Bilateral )  Patient Location: PACU  Anesthesia Type:General  Level of Consciousness: awake, drowsy and patient cooperative  Airway & Oxygen Therapy: Patient Spontanous Breathing and Patient connected to face mask oxygen  Post-op Assessment: Report given to RN and Post -op Vital signs reviewed and stable  Post vital signs: Reviewed and stable  Last Vitals:  Vitals Value Taken Time  BP 138/84 04/03/20 1115  Temp    Pulse 59 04/03/20 1116  Resp 20 04/03/20 1116  SpO2 100 % 04/03/20 1116  Vitals shown include unvalidated device data.  Last Pain:  Vitals:   04/03/20 0620  TempSrc:   PainSc: 0-No pain      Patients Stated Pain Goal: 4 (75/88/32 5498)  Complications: No apparent anesthesia complications

## 2020-04-03 NOTE — Op Note (Signed)
Preoperative Diagnosis: Bilateral nephrolithiasis  Postoperative Diagnosis:  Same  Procedure(s) Performed:   - Cystourethroscopy - Bilateral ureteroscopic stone extraction with laser lithotripsy - Bilateral retrograde pyelogram - Right ureteral stent removal  - Bilateral ureteral stent placement - Intraoperative fluoroscopy with interpretation <1hr.   Teaching Surgeon:  Louis Meckel, MD  Resident Surgeon:  Haskel Schroeder, MD  Assistant(s):  None  Anesthesia:  General  Fluids:  See anesthesia record  Estimated blood loss: Minimal  Specimens:  None  Drains:  Bilateral 6Fr x 26cm JJ ureteral stent with dangler  Complications:  None  Indications: 54 y.o. patient with a history of bilateral nephrolithiasis; see prior notes for details. Risks & benefits of the procedure discussed with the patient, who wishes to proceed.  Findings:   Cystourethroscopy with grossly normal bladder and expected stent edema.  Right ureteroscopy demonstrated tiny residual stones that were removed via basket extraction.  Ureteroscopy demonstrated large residual stone burden that was successfully treated with laser lithotripsy and basket extraction.  There were small residual pieces of stone in left kidney at end of procedure that will likely pass spontaneously; these were too small to easily extract with the basket.  Radiologic Interpretation of Retrograde Pyelogram: Right retrograde pyelogram demonstrated mild hydroureteronephrosis with no filling defects.  Left retrograde pyelogram demonstrated moderate hydroureteronephrosis with a filling defects consistent with lower pole stone at the start of the case.  Fluoroscopy at the end of the case demonstrated proper placement of bilateral ureteral stents.  Description:  The patient was correctly identified in the preop holding area where written informed consent as well potential risk and complication reviewed. The patient agreed. They were brought back  to the operative suite where a preinduction timeout was performed. Once correct information was verified, general anesthesia was induced. They were then gently placed into dorsal lithotomy position with SCDs in place for VTE prophylaxis. They were prepped and draped in the usual sterile fashion and given appropriate preoperative antibiotics. A second timeout was then performed.   We inserted a 13F rigid cystoscope per urethra with copious lubrication and normal saline irrigation running. This demonstrated findings as described above.    We turned our attention to the right ureteral orifice which had a prior right ureteral stent emanating without any encrustation.  We grasped the distal end of the ureteral stent and brought it to the urethral meatus with flexible graspers.  A sensor wire was then advanced through the ureteral stent under fluoroscopic guidance until an appropriate curl was noted in the expected location of the renal pelvis.  The ureteral stent was removed leaving the wire in place.  A dual-lumen catheter was then passed over the wire.  Right retrograde pyelogram was performed through the dual-lumen catheter with the above-noted findings.  A second wire was placed utilizing the dual-lumen catheter.  The dual-lumen catheter was removed leaving the sensor wires in place.  For one of the wires, we advanced a ureteral sheath to the level of the right proximal ureter.  We then obtained a flexible ureteroscope which passed through the sheath.  We identified tiny residual stone burden which was easily basket extracted.  We removed our instruments and the sheath leaving the wire in place for later access.  We then turned our attention to the left ureteral orifice.  Left retrograde pyelogram was performed with the above-noted findings.  We cannulated this with a combination of a open-ended catheter and a sensor wire.  The sensor wire was advanced to the expected  location of the renal pelvis.  The  open-ended catheter was removed, leaving the sensor wire in place.  In a similar fashion to the right side, we placed a second sensor wire utilizing the assistance of a dual-lumen catheter.  Again, we passed a ureteral sheath over one of our wires. Ureteroscopy was then performed with the above-noted findings.  Of note there was large residual stone burden in the mid and lower pole.  This was treated with laser lithotripsy utilizing the 200 m laser.  Fragments were then basket extracted.  As we already had stone for analysis from prior procedure, stone fragments were not sent for analysis.  We then reexplored the location of stone treatment which did demonstrate multiple tiny residual stones.  Due to their location and small size we are not able to basket extract these and elected to end procedure.  These stone fragments are small enough to be able to pass through ureter.   We decided to leave bilateral stents.  During initial left ureteral stent placement, wire was curled in the bladder and we temporarily lost access to the left kidney.  We were able to replace access wire without difficulty.  Bilaterally the same procedure was performed to place the stents.  We advanced a 6 [place] by 26 cm JJ ureteral stent with a Dangler and the assistance of a stent pusher under direct fluoroscopic and visual guidance.  Sensor wire removal demonstrated satisfactory stent curls proximally in the renal pelvis and distally in the bladder bilaterally.  The bladder was emptied and all instrumentation was removed.  Stent strings were cut to an appropriate length and tucked within the vagina.  The patient was awoken and taken to the recovery unit for routine postoperative care.   Post Op Plan:   Discharge home when meets PACU criteria.  She will have stents removed at follow-up appointment.  She should continue antibiotics until stent removal appointment.

## 2020-04-03 NOTE — Interval H&P Note (Signed)
History and Physical Interval Note:  04/03/2020 7:27 AM  Adrienne Lane  has presented today for surgery, with the diagnosis of BILATERAL NEPHROLITHIASIS.  The various methods of treatment have been discussed with the patient and family. After consideration of risks, benefits and other options for treatment, the patient has consented to  Procedure(s): CYSTOSCOPY/RETROGRADE/URETEROSCOPY (Bilateral) as a surgical intervention.  The patient's history has been reviewed, patient examined, no change in status, stable for surgery.  I have reviewed the patient's chart and labs.  Questions were answered to the patient's satisfaction.     Ardis Hughs

## 2020-04-03 NOTE — Anesthesia Procedure Notes (Signed)
Procedure Name: Intubation Date/Time: 04/03/2020 7:39 AM Performed by: Eben Burow, CRNA Pre-anesthesia Checklist: Patient identified, Emergency Drugs available, Suction available, Patient being monitored and Timeout performed Patient Re-evaluated:Patient Re-evaluated prior to induction Oxygen Delivery Method: Circle system utilized Preoxygenation: Pre-oxygenation with 100% oxygen Induction Type: IV induction Ventilation: Mask ventilation without difficulty Laryngoscope Size: Mac and 4 Grade View: Grade I Tube type: Oral Tube size: 7.0 mm Number of attempts: 1 Airway Equipment and Method: Stylet Placement Confirmation: ETT inserted through vocal cords under direct vision,  positive ETCO2 and breath sounds checked- equal and bilateral Secured at: 22 cm Tube secured with: Tape Dental Injury: Teeth and Oropharynx as per pre-operative assessment

## 2020-04-03 NOTE — Anesthesia Postprocedure Evaluation (Signed)
Anesthesia Post Note  Patient: Adrienne Lane  Procedure(s) Performed: CYSTOSCOPY/RETROGRADE/URETEROSCOPY (Bilateral )     Patient location during evaluation: PACU Anesthesia Type: General Level of consciousness: awake and alert Pain management: pain level controlled Vital Signs Assessment: post-procedure vital signs reviewed and stable Respiratory status: spontaneous breathing, nonlabored ventilation, respiratory function stable and patient connected to nasal cannula oxygen Cardiovascular status: blood pressure returned to baseline and stable Postop Assessment: no apparent nausea or vomiting Anesthetic complications: no    Last Vitals:  Vitals:   04/03/20 1115 04/03/20 1130  BP: 138/84 127/78  Pulse: (!) 55 62  Resp: 20 19  Temp:    SpO2: 100% 100%    Last Pain:  Vitals:   04/03/20 1130  TempSrc:   PainSc: Asleep                 Shondell Poulson S

## 2020-04-03 NOTE — Interval H&P Note (Signed)
History and Physical Interval Note: Patient has done well since last surgery. Plan for bilateral ureteroscopy today to evaluate residual stone.   04/03/2020 7:07 AM  Adrienne Lane  has presented today for surgery, with the diagnosis of BILATERAL NEPHROLITHIASIS.  The various methods of treatment have been discussed with the patient and family. After consideration of risks, benefits and other options for treatment, the patient has consented to  Procedure(s): CYSTOSCOPY/RETROGRADE/URETEROSCOPY (Bilateral) as a surgical intervention.  The patient's history has been reviewed, patient examined, no change in status, stable for surgery.  I have reviewed the patient's chart and labs.  Questions were answered to the patient's satisfaction.     Haskel Schroeder

## 2020-04-09 MED FILL — hydrALAZINE HCL 100 MG TABS: 100 | 30 days supply | Qty: 90 | Fill #2

## 2020-04-10 ENCOUNTER — Other Ambulatory Visit: Payer: Self-pay | Admitting: Student in an Organized Health Care Education/Training Program

## 2020-04-10 MED ORDER — SULFAMETHOXAZOLE-TRIMETHOPRIM 400-80 MG PO TABS: 1.0000 | ORAL_TABLET | Freq: Every day | ORAL | 2 refills | Status: DC

## 2020-04-10 MED FILL — SULFAMETHOXAZOLE-TMP SS TAB: 400-80 | 30 days supply | Qty: 30 | Fill #0

## 2020-04-10 NOTE — Progress Notes (Signed)
Low dose Bactrim for struvite stones ordered. Office visit documented through [facility].

## 2020-05-02 ENCOUNTER — Telehealth (HOSPITAL_COMMUNITY): Payer: Self-pay | Admitting: *Deleted

## 2020-05-02 DIAGNOSIS — I5022 Chronic systolic (congestive) heart failure: Secondary | ICD-10-CM

## 2020-05-02 MED ORDER — METOLAZONE 5 MG PO TABS: ORAL_TABLET | ORAL | 0 refills | Status: DC

## 2020-05-02 NOTE — Telephone Encounter (Signed)
Take metolazone 5mg  with kcl 40 meq x 2 days  Bmet monday

## 2020-05-02 NOTE — Telephone Encounter (Signed)
Pt aware and agreeable with plan. Lab appt scheduled.  

## 2020-05-02 NOTE — Telephone Encounter (Signed)
Pt called stating her weight is up 15lbs in 2 weeks and shes becoming more short of breath when she walks. PT c/o swelling in ankles and feet. No change in diet and she states she is taking all medications as prescribed. Pt said weight gain was gradual gaining about 2lbs every day or so. Pt denies chest pain.    Routed to [place] for advice

## 2020-05-02 NOTE — Addendum Note (Signed)
Addended by: Harvie Junior on: 05/02/2020 02:59 PM   Modules accepted: Orders

## 2020-05-05 ENCOUNTER — Other Ambulatory Visit: Payer: Self-pay

## 2020-05-05 ENCOUNTER — Ambulatory Visit (HOSPITAL_COMMUNITY)
Admission: RE | Admit: 2020-05-05 | Discharge: 2020-05-05 | Disposition: A | Discharge status: Home / Self Care | Payer: Self-pay | Source: Ambulatory Visit | Attending: Internal Medicine | Admitting: Internal Medicine

## 2020-05-05 DIAGNOSIS — I5022 Chronic systolic (congestive) heart failure: Secondary | ICD-10-CM | POA: Insufficient documentation

## 2020-05-05 LAB — BASIC METABOLIC PANEL WITH GFR
Anion gap: 12 (ref 5–15)
BUN: 25 mg/dL — ABNORMAL HIGH (ref 6–20)
CO2: 26 mmol/L (ref 22–32)
Calcium: 8.7 mg/dL — ABNORMAL LOW (ref 8.9–10.3)
Chloride: 100 mmol/L (ref 98–111)
Creatinine, Ser: 1.94 mg/dL — ABNORMAL HIGH (ref 0.44–1.00)
GFR calc Af Amer: 34 mL/min — ABNORMAL LOW
GFR calc non Af Amer: 29 mL/min — ABNORMAL LOW
Glucose, Bld: 151 mg/dL — ABNORMAL HIGH (ref 70–99)
Potassium: 3.4 mmol/L — ABNORMAL LOW (ref 3.5–5.1)
Sodium: 138 mmol/L (ref 135–145)

## 2020-05-09 ENCOUNTER — Telehealth: Payer: Self-pay

## 2020-05-09 NOTE — Telephone Encounter (Signed)

## 2020-05-12 ENCOUNTER — Other Ambulatory Visit: Payer: Self-pay

## 2020-05-12 ENCOUNTER — Encounter: Payer: Self-pay | Admitting: Internal Medicine

## 2020-05-12 ENCOUNTER — Ambulatory Visit (INDEPENDENT_AMBULATORY_CARE_PROVIDER_SITE_OTHER): Payer: Self-pay | Admitting: Internal Medicine

## 2020-05-12 VITALS — BP 149/66 | HR 62 | Temp 97.5°F | Resp 17 | Ht 68.0 in | Wt 228.0 lb

## 2020-05-12 DIAGNOSIS — E1165 Type 2 diabetes mellitus with hyperglycemia: Secondary | ICD-10-CM

## 2020-05-12 DIAGNOSIS — Z794 Long term (current) use of insulin: Secondary | ICD-10-CM

## 2020-05-12 DIAGNOSIS — I10 Essential (primary) hypertension: Secondary | ICD-10-CM

## 2020-05-12 DIAGNOSIS — Z114 Encounter for screening for human immunodeficiency virus [HIV]: Secondary | ICD-10-CM

## 2020-05-12 LAB — POCT GLYCOSYLATED HEMOGLOBIN (HGB A1C): Hemoglobin A1C: 8.1 % — AB (ref 4.0–5.6)

## 2020-05-12 LAB — GLUCOSE, POCT (MANUAL RESULT ENTRY): POC Glucose: 221 mg/dl — AB (ref 70–99)

## 2020-05-12 NOTE — Patient Instructions (Signed)
Try changing the battery on your meter. If that does not resolve your error message, please bring it to your appointment with the pharmacist to see if you can troubleshoot at that visit.

## 2020-05-12 NOTE — Progress Notes (Signed)
Subjective:    Adrienne Lane - 54 y.o. female MRN [id number]  Date of birth: 07-23-1966  HPI  Adrienne Lane is here for DM follow up.  Diabetes mellitus, Type 2 Disease Monitoring             Blood Sugar Ranges: Has not been monitoring due to receiving error message on her meter. Had been running high in the low 200s prior to receiving this message.              Polyuria: None              Visual problems: no   Urine Microalbumin: Collected. Patient was previously on Lisinopril but appears to have been held at hospital d/c due to ARF.   Last A1C: 8.4 (Feb 2021)   Medications: Lantus 15u in AM  Medication Compliance: yes  Medication Side Effects             Hypoglycemia: no    Health Maintenance:  Health Maintenance Due  Topic Date Due  . COVID-19 Vaccine (1) Never done  . HIV Screening  Never done  . PAP SMEAR-Modifier  Never done  . MAMMOGRAM  Never done    -  reports that she has never smoked. She has never used smokeless tobacco. - Review of Systems: Per HPI. - Past Medical History: Patient Active Problem List   Diagnosis Date Noted  . Non-ischemic cardiomyopathy ([place])   . Normocytic anemia   . Nephrolithiasis 02/14/2020  . Paroxysmal atrial fibrillation ([place]) 12/18/2019  . 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 Gross hematuria 12/18/2019  . Staghorn renal calculus 12/18/2019  . Chronic kidney disease (CKD), stage IV (severe) ([place]) 12/18/2019  . Umbilical hernia without obstruction and without gangrene 12/18/2019  . Uterine fibroid 10/25/2019  . Right hip pain   . Type 2 diabetes mellitus ([place])   . Essential hypertension   . CHF (congestive heart failure) ([place]) 10/21/2019  . Atrial fibrillation with RVR ([place]) 10/21/2019  . Atrial fibrillation with rapid ventricular response ([place]) 10/20/2019   - Medications: reviewed and updated   Objective:   Physical Exam BP (!) 149/66   Pulse 62   Temp (!) 97.5 F (36.4 C) (Temporal)   Resp 17   Ht 5\' 8"  (1.727 m)   Wt 228  lb (103.4 kg)   LMP 03/13/2020   SpO2 95%   BMI 34.67 kg/m  Physical Exam  Constitutional: She is oriented to person, place, and time and well-developed, well-nourished, and in no distress. No distress.  HENT:  Head: Normocephalic and atraumatic.  Eyes: Conjunctivae and EOM are normal.  Cardiovascular: Normal rate, regular rhythm and normal heart sounds.  No murmur heard. Pulmonary/Chest: Effort normal and breath sounds normal. No respiratory distress.  Musculoskeletal:        General: Normal range of motion.  Neurological: She is alert and oriented to person, place, and time.  Skin: Skin is warm and dry. She is not diaphoretic.  Diabetic Foot Check -  Appearance - no lesions, ulcers or calluses Skin - no unusual pallor or redness Monofilament testing - normal bilaterally  Right - Great toe, medial, central, lateral ball and posterior foot intact Left - Great toe, medial, central, lateral ball and posterior foot intact   Psychiatric: Affect and judgment normal.           Assessment & Plan:   1. Type 2 diabetes mellitus with hyperglycemia, with long-term current use of insulin (HCC) CBG 221 non fasting and A1c  8.1%. A1c has improved slightly; however fasting CBGs per patient are above goal. Would likely benefit from increase in insulin dosing; however, reports error message on meter. She did not bring this today so unable to help troubleshoot. She reports the meter is only about 35 months old. Have recommended trying battery change. Schedule follow up with PharmD and bring meter with her to visit if still receiving error message. Discussed that will need her to have ability to monitor CBGs at home before we can adjust her insulin dose.  Counseled on Diabetic diet, my plate method, 924 minutes of moderate intensity exercise/week Blood sugar logs with fasting goals of 80-120 mg/dl, random of less than 180 and in the event of sugars less than 60 mg/dl or greater than 400 mg/dl  encouraged to notify the clinic. Advised on the need for annual eye exams, annual foot exams, Pneumonia vaccine. - Glucose (CBG) - HgB A1c - HM Diabetes Foot Exam - Microalbumin/Creatinine Ratio, Urine  2. Screening for HIV (human immunodeficiency virus) - HIV antibody (with reflex)  3. Essential hypertension BP is above goal; however, at past office visits has been much better controlled. Will hold off on adjusting medications for now and will continue to monitor. If remains elevated, could consider increase in Amlodipine dose. Additionally, patient was previously on Lisinopril but this was discontinued at hospitalization due to ARF.          Phill Myron, D.O. 05/12/2020, 9:16 AM Primary Care at [facility]

## 2020-05-13 ENCOUNTER — Other Ambulatory Visit: Payer: Self-pay | Admitting: Internal Medicine

## 2020-05-13 ENCOUNTER — Ambulatory Visit: Payer: Self-pay

## 2020-05-13 DIAGNOSIS — R809 Proteinuria, unspecified: Secondary | ICD-10-CM | POA: Insufficient documentation

## 2020-05-13 DIAGNOSIS — N184 Chronic kidney disease, stage 4 (severe): Secondary | ICD-10-CM

## 2020-05-13 LAB — MICROALBUMIN / CREATININE URINE RATIO
Creatinine, Urine: 51.4 mg/dL
Microalb/Creat Ratio: 1776 mg/g creat — ABNORMAL HIGH (ref 0–29)
Microalbumin, Urine: 913.1 ug/mL

## 2020-05-13 LAB — HIV ANTIBODY (ROUTINE TESTING W REFLEX): HIV Screen 4th Generation wRfx: NONREACTIVE

## 2020-05-13 MED ORDER — LOSARTAN POTASSIUM 50 MG PO TABS: 50.0000 mg | ORAL_TABLET | Freq: Every day | ORAL | 3 refills | Status: DC

## 2020-05-13 MED FILL — LOSARTAN POTASSIUM 50 MG TA: 50 | 30 days supply | Qty: 30 | Fill #0

## 2020-05-14 MED FILL — ISOSORBIDE MN ER 60 MG TAB: 60 | 30 days supply | Qty: 30 | Fill #4

## 2020-05-14 MED FILL — TRUE METRIX TEST STRIP: 25 days supply | Qty: 100 | Fill #1

## 2020-05-14 MED FILL — CARVEDILOL 25 MG TABLET: 25 | 30 days supply | Qty: 60 | Fill #2

## 2020-05-20 ENCOUNTER — Ambulatory Visit: Payer: Self-pay | Admitting: *Deleted

## 2020-05-20 ENCOUNTER — Ambulatory Visit
Admission: RE | Admit: 2020-05-20 | Discharge: 2020-05-20 | Disposition: A | Discharge status: Home / Self Care | Payer: Self-pay | Source: Ambulatory Visit | Attending: Obstetrics and Gynecology | Admitting: Obstetrics and Gynecology

## 2020-05-20 ENCOUNTER — Other Ambulatory Visit: Payer: Self-pay

## 2020-05-20 VITALS — BP 138/86 | Temp 97.5°F | Wt 231.9 lb

## 2020-05-20 DIAGNOSIS — Z124 Encounter for screening for malignant neoplasm of cervix: Secondary | ICD-10-CM

## 2020-05-20 DIAGNOSIS — Z01419 Encounter for gynecological examination (general) (routine) without abnormal findings: Secondary | ICD-10-CM

## 2020-05-20 DIAGNOSIS — Z1231 Encounter for screening mammogram for malignant neoplasm of breast: Secondary | ICD-10-CM

## 2020-05-20 NOTE — Patient Instructions (Signed)
Explained breast self awareness with Karl Luke. Let patient know BCCCP will cover Pap smears and HPV typing every 5 years unless has a history of abnormal Pap smears. Patient stated she is on the wait list to have a hysterectomy completed due to her fibroids. Let patient know if she has a hysterectomy completed that she will not need any further Pap smears due to she has no history of an abnormal Pap smear and the hysterectomy will be completed for benign reasons. Referred patient to the [facility] for a screening mammogram. Appointment scheduled for Tuesday, May 20, 2020 at 1410. Patient aware of appointment and will be there. Let patient know will follow up with her within the next couple weeks with results of Pap smear by letter or phone. Informed patient that the [facility] will follow-up with her within the next couple of weeks with results of her mammogram by letter or phone. Karl Luke verbalized understanding.  Karolyne Timmons, Arvil Chaco, RN 11:43 AM

## 2020-05-20 NOTE — Progress Notes (Signed)
Adrienne Lane is a 54 y.o. [place] female who presents to [facility] clinic today with no complaints.   Pap Smear: Pap smear completed today. Last Pap smear was in 2018 at a clinic in [place] and was normal per patient. Per patient has no history of an abnormal Pap smear. No Pap smear results are in Epic.   Physical exam: Breasts Breasts symmetrical. No skin abnormalities bilateral breasts. No nipple retraction bilateral breasts. No nipple discharge bilateral breasts. No lymphadenopathy. No lumps palpated bilateral breasts. No complaints of pain or tenderness on exam.       Pelvic/Bimanual Ext Genitalia No lesions, no swelling and no discharge observed on external genitalia.        Vagina Vagina pink and normal texture. No lesions or blood observed in vagina. Patient has a history of AUB due to fibroids. Patient is being followed by an OBGYN and on a wait list for a hysterectomy.       Cervix Cervix is present. Cervix pink and of normal texture. Blood observed at cervical os.   Uterus Uterus is present and palpable. Uterus is firm and enlarged due to uterine fibroids.     Adnexae Bilateral ovaries present and palpable. No tenderness on palpation.         Rectovaginal No rectal exam completed today since patient had no rectal complaints. No skin abnormalities observed on exam.     Smoking History: Patient has never smoked.   Patient Navigation: Patient education provided. Access to services provided for patient through [facility] program.   Colorectal Cancer Screening: Per patient had a colonoscopy completed in May 2019 in [place]. No complaints today.    Breast and Cervical Cancer Risk Assessment: Patient has no family history of breast cancer, known genetic mutations, or radiation treatment to the chest before age 38. Patient has no history of cervical dysplasia, immunocompromised, or DES exposure in-utero.  Risk Assessment    Risk Scores      05/20/2020   Last edited by:  Demetrius Revel, LPN   5-year risk: 1.2 %   Lifetime risk: 8.3 %          A: BCCCP exam with pap smear   P: Referred patient to the [facility] for a screening mammogram. Appointment scheduled for Tuesday, May 20, 2020 at 1410.  Adrienne Parish, RN 05/20/2020 11:43 AM

## 2020-05-21 ENCOUNTER — Ambulatory Visit (HOSPITAL_COMMUNITY)
Admission: RE | Admit: 2020-05-21 | Discharge: 2020-05-21 | Disposition: A | Discharge status: Home / Self Care | Payer: Self-pay | Source: Ambulatory Visit | Attending: Urology | Admitting: Urology

## 2020-05-21 ENCOUNTER — Other Ambulatory Visit (HOSPITAL_COMMUNITY): Payer: Self-pay | Admitting: Urology

## 2020-05-21 ENCOUNTER — Other Ambulatory Visit: Payer: Self-pay | Admitting: Urology

## 2020-05-21 DIAGNOSIS — N200 Calculus of kidney: Secondary | ICD-10-CM

## 2020-05-21 NOTE — Progress Notes (Signed)
Patient notified of results & recommendations. Expressed understanding. Scheduled lab appointment for 05/29/2020 @ 9 AM.

## 2020-05-22 ENCOUNTER — Telehealth (HOSPITAL_COMMUNITY): Payer: Self-pay | Admitting: Cardiology

## 2020-05-22 DIAGNOSIS — I5022 Chronic systolic (congestive) heart failure: Secondary | ICD-10-CM

## 2020-05-22 LAB — CYTOLOGY - PAP
Adequacy: ABSENT
Comment: NEGATIVE
Diagnosis: NEGATIVE
High risk HPV: NEGATIVE

## 2020-05-22 NOTE — Telephone Encounter (Signed)
Repeat labs 05/23/20 Pt aware

## 2020-05-23 ENCOUNTER — Emergency Department (HOSPITAL_COMMUNITY): Payer: Self-pay

## 2020-05-23 ENCOUNTER — Telehealth: Payer: Self-pay

## 2020-05-23 ENCOUNTER — Inpatient Hospital Stay (HOSPITAL_COMMUNITY)
Admission: EM | Admit: 2020-05-23 | Discharge: 2020-05-26 | DRG: 660 | Disposition: A | Discharge status: Home / Self Care | Payer: Self-pay | Attending: Internal Medicine | Admitting: Internal Medicine

## 2020-05-23 ENCOUNTER — Other Ambulatory Visit: Payer: Self-pay

## 2020-05-23 ENCOUNTER — Ambulatory Visit (HOSPITAL_COMMUNITY)
Admission: RE | Admit: 2020-05-23 | Discharge: 2020-05-23 | Disposition: A | Discharge status: Home / Self Care | Payer: Self-pay | Source: Ambulatory Visit | Attending: Cardiology | Admitting: Cardiology

## 2020-05-23 ENCOUNTER — Encounter (HOSPITAL_COMMUNITY): Payer: Self-pay

## 2020-05-23 DIAGNOSIS — I509 Heart failure, unspecified: Secondary | ICD-10-CM

## 2020-05-23 DIAGNOSIS — E876 Hypokalemia: Secondary | ICD-10-CM | POA: Diagnosis present

## 2020-05-23 DIAGNOSIS — Z833 Family history of diabetes mellitus: Secondary | ICD-10-CM

## 2020-05-23 DIAGNOSIS — K429 Umbilical hernia without obstruction or gangrene: Secondary | ICD-10-CM | POA: Diagnosis present

## 2020-05-23 DIAGNOSIS — R809 Proteinuria, unspecified: Secondary | ICD-10-CM

## 2020-05-23 DIAGNOSIS — Z9049 Acquired absence of other specified parts of digestive tract: Secondary | ICD-10-CM

## 2020-05-23 DIAGNOSIS — Z8249 Family history of ischemic heart disease and other diseases of the circulatory system: Secondary | ICD-10-CM

## 2020-05-23 DIAGNOSIS — D259 Leiomyoma of uterus, unspecified: Secondary | ICD-10-CM | POA: Diagnosis present

## 2020-05-23 DIAGNOSIS — Z7901 Long term (current) use of anticoagulants: Secondary | ICD-10-CM

## 2020-05-23 DIAGNOSIS — N132 Hydronephrosis with renal and ureteral calculous obstruction: Secondary | ICD-10-CM | POA: Diagnosis present

## 2020-05-23 DIAGNOSIS — Z79899 Other long term (current) drug therapy: Secondary | ICD-10-CM

## 2020-05-23 DIAGNOSIS — Z6834 Body mass index (BMI) 34.0-34.9, adult: Secondary | ICD-10-CM

## 2020-05-23 DIAGNOSIS — I493 Ventricular premature depolarization: Secondary | ICD-10-CM | POA: Diagnosis present

## 2020-05-23 DIAGNOSIS — Z20822 Contact with and (suspected) exposure to covid-19: Secondary | ICD-10-CM | POA: Diagnosis present

## 2020-05-23 DIAGNOSIS — I5032 Chronic diastolic (congestive) heart failure: Secondary | ICD-10-CM | POA: Diagnosis present

## 2020-05-23 DIAGNOSIS — I130 Hypertensive heart and chronic kidney disease with heart failure and stage 1 through stage 4 chronic kidney disease, or unspecified chronic kidney disease: Secondary | ICD-10-CM | POA: Diagnosis present

## 2020-05-23 DIAGNOSIS — E669 Obesity, unspecified: Secondary | ICD-10-CM | POA: Diagnosis present

## 2020-05-23 DIAGNOSIS — I10 Essential (primary) hypertension: Secondary | ICD-10-CM | POA: Diagnosis present

## 2020-05-23 DIAGNOSIS — N179 Acute kidney failure, unspecified: Principal | ICD-10-CM | POA: Diagnosis present

## 2020-05-23 DIAGNOSIS — D509 Iron deficiency anemia, unspecified: Secondary | ICD-10-CM | POA: Diagnosis present

## 2020-05-23 DIAGNOSIS — Z823 Family history of stroke: Secondary | ICD-10-CM

## 2020-05-23 DIAGNOSIS — I5022 Chronic systolic (congestive) heart failure: Secondary | ICD-10-CM | POA: Insufficient documentation

## 2020-05-23 DIAGNOSIS — E1122 Type 2 diabetes mellitus with diabetic chronic kidney disease: Secondary | ICD-10-CM | POA: Diagnosis present

## 2020-05-23 DIAGNOSIS — Z87442 Personal history of urinary calculi: Secondary | ICD-10-CM

## 2020-05-23 DIAGNOSIS — K5900 Constipation, unspecified: Secondary | ICD-10-CM | POA: Diagnosis present

## 2020-05-23 DIAGNOSIS — I428 Other cardiomyopathies: Secondary | ICD-10-CM | POA: Diagnosis present

## 2020-05-23 DIAGNOSIS — N184 Chronic kidney disease, stage 4 (severe): Secondary | ICD-10-CM | POA: Diagnosis present

## 2020-05-23 DIAGNOSIS — I482 Chronic atrial fibrillation, unspecified: Secondary | ICD-10-CM | POA: Diagnosis present

## 2020-05-23 DIAGNOSIS — Z794 Long term (current) use of insulin: Secondary | ICD-10-CM

## 2020-05-23 DIAGNOSIS — I480 Paroxysmal atrial fibrillation: Secondary | ICD-10-CM | POA: Diagnosis present

## 2020-05-23 DIAGNOSIS — N134 Hydroureter: Secondary | ICD-10-CM

## 2020-05-23 LAB — BASIC METABOLIC PANEL
Anion gap: 11 (ref 5–15)
BUN: 36 mg/dL — ABNORMAL HIGH (ref 6–20)
CO2: 29 mmol/L (ref 22–32)
Calcium: 9.5 mg/dL (ref 8.9–10.3)
Chloride: 100 mmol/L (ref 98–111)
Creatinine, Ser: 3.24 mg/dL — ABNORMAL HIGH (ref 0.44–1.00)
GFR calc Af Amer: 18 mL/min — ABNORMAL LOW (ref >60)
GFR calc non Af Amer: 16 mL/min — ABNORMAL LOW (ref >60)
Glucose, Bld: 194 mg/dL — ABNORMAL HIGH (ref 70–99)
Potassium: 3.3 mmol/L — ABNORMAL LOW (ref 3.5–5.1)
Sodium: 140 mmol/L (ref 135–145)

## 2020-05-23 LAB — COMPREHENSIVE METABOLIC PANEL
ALT: 15 U/L (ref 0–44)
AST: 20 U/L (ref 15–41)
Albumin: 3.6 g/dL (ref 3.5–5.0)
Alkaline Phosphatase: 75 U/L (ref 38–126)
Anion gap: 12 (ref 5–15)
BUN: 41 mg/dL — ABNORMAL HIGH (ref 6–20)
CO2: 29 mmol/L (ref 22–32)
Calcium: 9.4 mg/dL (ref 8.9–10.3)
Chloride: 100 mmol/L (ref 98–111)
Creatinine, Ser: 3.23 mg/dL — ABNORMAL HIGH (ref 0.44–1.00)
GFR calc Af Amer: 18 mL/min — ABNORMAL LOW (ref >60)
GFR calc non Af Amer: 16 mL/min — ABNORMAL LOW (ref >60)
Glucose, Bld: 123 mg/dL — ABNORMAL HIGH (ref 70–99)
Potassium: 3.4 mmol/L — ABNORMAL LOW (ref 3.5–5.1)
Sodium: 141 mmol/L (ref 135–145)
Total Bilirubin: 0.8 mg/dL (ref 0.3–1.2)
Total Protein: 7.8 g/dL (ref 6.5–8.1)

## 2020-05-23 LAB — CBC
HCT: 37.9 % (ref 36.0–46.0)
Hemoglobin: 12.4 g/dL (ref 12.0–15.0)
MCH: 29.6 pg (ref 26.0–34.0)
MCHC: 32.7 g/dL (ref 30.0–36.0)
MCV: 90.5 fL (ref 80.0–100.0)
Platelets: 189 10*3/uL (ref 150–400)
RBC: 4.19 MIL/uL (ref 3.87–5.11)
RDW: 14.3 % (ref 11.5–15.5)
WBC: 9.7 10*3/uL (ref 4.0–10.5)
nRBC: 0 % (ref 0.0–0.2)

## 2020-05-23 LAB — URINALYSIS, ROUTINE W REFLEX MICROSCOPIC
Bilirubin Urine: NEGATIVE
Glucose, UA: NEGATIVE mg/dL
Hgb urine dipstick: NEGATIVE
Ketones, ur: NEGATIVE mg/dL
Nitrite: NEGATIVE
Protein, ur: 100 mg/dL — AB
Specific Gravity, Urine: 1.006 (ref 1.005–1.030)
pH: 7 (ref 5.0–8.0)

## 2020-05-23 LAB — SARS CORONAVIRUS 2 BY RT PCR (HOSPITAL ORDER, PERFORMED IN ~~LOC~~ HOSPITAL LAB): SARS Coronavirus 2: NEGATIVE

## 2020-05-23 LAB — LIPASE, BLOOD: Lipase: 21 U/L (ref 11–51)

## 2020-05-23 LAB — CBG MONITORING, ED
Glucose-Capillary: 122 mg/dL — ABNORMAL HIGH (ref 70–99)
Glucose-Capillary: 237 mg/dL — ABNORMAL HIGH (ref 70–99)

## 2020-05-23 MED ORDER — MEXILETINE HCL 150 MG PO CAPS
300.0000 mg | ORAL_CAPSULE | Freq: Two times a day (BID) | ORAL | Status: DC
  Administered 2020-05-23 – 2020-05-26 (×5): 300 mg via ORAL
  Filled 2020-05-23 (×6): qty 2

## 2020-05-23 MED ORDER — HYDROMORPHONE HCL 1 MG/ML IJ SOLN
0.5000 mg | INTRAMUSCULAR | Status: DC | PRN
  Administered 2020-05-23: 0.5 mg via INTRAVENOUS
  Filled 2020-05-23: qty 1

## 2020-05-23 MED ORDER — SODIUM CHLORIDE 0.9% FLUSH: 3.0000 mL | Freq: Once | INTRAVENOUS | Status: DC

## 2020-05-23 MED ORDER — ONDANSETRON HCL 4 MG/2ML IJ SOLN: 4.0000 mg | Freq: Four times a day (QID) | INTRAMUSCULAR | Status: DC | PRN

## 2020-05-23 MED ORDER — HYDRALAZINE HCL 25 MG PO TABS
100.0000 mg | ORAL_TABLET | Freq: Three times a day (TID) | ORAL | Status: DC
  Administered 2020-05-23 – 2020-05-26 (×6): 100 mg via ORAL
  Filled 2020-05-23 (×7): qty 4

## 2020-05-23 MED ORDER — INSULIN ASPART 100 UNIT/ML ~~LOC~~ SOLN
0.0000 [IU] | Freq: Three times a day (TID) | SUBCUTANEOUS | Status: DC
  Administered 2020-05-23: 2 [IU] via SUBCUTANEOUS
  Administered 2020-05-24: 5 [IU] via SUBCUTANEOUS
  Administered 2020-05-24 – 2020-05-25 (×2): 3 [IU] via SUBCUTANEOUS
  Administered 2020-05-25: 5 [IU] via SUBCUTANEOUS
  Administered 2020-05-25: 3 [IU] via SUBCUTANEOUS
  Administered 2020-05-26 (×2): 2 [IU] via SUBCUTANEOUS
  Filled 2020-05-23: qty 0.15

## 2020-05-23 MED ORDER — FERROUS SULFATE 325 (65 FE) MG PO TABS
325.0000 mg | ORAL_TABLET | Freq: Every day | ORAL | Status: DC
  Administered 2020-05-25 – 2020-05-26 (×2): 325 mg via ORAL
  Filled 2020-05-23 (×2): qty 1

## 2020-05-23 MED ORDER — SODIUM CHLORIDE 0.9 % IV BOLUS
500.0000 mL | Freq: Once | INTRAVENOUS | Status: AC
  Administered 2020-05-23: 500 mL via INTRAVENOUS

## 2020-05-23 MED ORDER — HEPARIN SODIUM (PORCINE) 5000 UNIT/ML IJ SOLN
5000.0000 [IU] | Freq: Three times a day (TID) | INTRAMUSCULAR | Status: DC
  Administered 2020-05-23 – 2020-05-26 (×7): 5000 [IU] via SUBCUTANEOUS
  Filled 2020-05-23 (×7): qty 1

## 2020-05-23 MED ORDER — ONDANSETRON HCL 4 MG PO TABS: 4.0000 mg | ORAL_TABLET | Freq: Four times a day (QID) | ORAL | Status: DC | PRN

## 2020-05-23 MED ORDER — INSULIN GLARGINE 100 UNIT/ML ~~LOC~~ SOLN
15.0000 [IU] | Freq: Every day | SUBCUTANEOUS | Status: DC
  Administered 2020-05-25 – 2020-05-26 (×2): 15 [IU] via SUBCUTANEOUS
  Filled 2020-05-23 (×3): qty 0.15

## 2020-05-23 MED ORDER — AMLODIPINE BESYLATE 5 MG PO TABS
5.0000 mg | ORAL_TABLET | Freq: Every day | ORAL | Status: DC
  Administered 2020-05-23 – 2020-05-26 (×3): 5 mg via ORAL
  Filled 2020-05-23 (×3): qty 1

## 2020-05-23 MED ORDER — FENTANYL CITRATE (PF) 100 MCG/2ML IJ SOLN
50.0000 ug | Freq: Once | INTRAMUSCULAR | Status: AC
  Administered 2020-05-23: 50 ug via INTRAVENOUS
  Filled 2020-05-23: qty 2

## 2020-05-23 MED ORDER — SODIUM CHLORIDE 0.9 % IV SOLN: INTRAVENOUS | Status: DC

## 2020-05-23 MED ORDER — ONDANSETRON HCL 4 MG/2ML IJ SOLN
4.0000 mg | Freq: Once | INTRAMUSCULAR | Status: AC
  Administered 2020-05-23: 4 mg via INTRAVENOUS
  Filled 2020-05-23: qty 2

## 2020-05-23 MED ORDER — BISACODYL 5 MG PO TBEC
5.0000 mg | DELAYED_RELEASE_TABLET | Freq: Every day | ORAL | Status: DC | PRN
  Filled 2020-05-23: qty 1

## 2020-05-23 MED ORDER — ISOSORBIDE MONONITRATE ER 60 MG PO TB24
60.0000 mg | ORAL_TABLET | Freq: Every day | ORAL | Status: DC
  Administered 2020-05-23 – 2020-05-26 (×3): 60 mg via ORAL
  Filled 2020-05-23 (×3): qty 1

## 2020-05-23 MED ORDER — POTASSIUM CHLORIDE ER 10 MEQ PO TBCR: 40.0000 meq | EXTENDED_RELEASE_TABLET | Freq: Every day | ORAL | Status: DC

## 2020-05-23 MED ORDER — SENNOSIDES-DOCUSATE SODIUM 8.6-50 MG PO TABS: 1.0000 | ORAL_TABLET | Freq: Every evening | ORAL | Status: DC | PRN

## 2020-05-23 MED ORDER — HYDROCODONE-ACETAMINOPHEN 5-325 MG PO TABS
1.0000 | ORAL_TABLET | ORAL | Status: DC | PRN
  Administered 2020-05-24: 2 via ORAL
  Administered 2020-05-24 – 2020-05-25 (×2): 1 via ORAL
  Filled 2020-05-23 (×2): qty 1
  Filled 2020-05-23: qty 2

## 2020-05-23 MED ORDER — INSULIN ASPART 100 UNIT/ML ~~LOC~~ SOLN
0.0000 [IU] | Freq: Every day | SUBCUTANEOUS | Status: DC
  Administered 2020-05-23: 2 [IU] via SUBCUTANEOUS
  Administered 2020-05-24: 3 [IU] via SUBCUTANEOUS
  Administered 2020-05-25: 2 [IU] via SUBCUTANEOUS
  Filled 2020-05-23: qty 0.05

## 2020-05-23 MED ORDER — TAMSULOSIN HCL 0.4 MG PO CAPS
0.4000 mg | ORAL_CAPSULE | Freq: Every day | ORAL | Status: DC
  Administered 2020-05-23 – 2020-05-26 (×3): 0.4 mg via ORAL
  Filled 2020-05-23 (×3): qty 1

## 2020-05-23 MED ORDER — CARVEDILOL 25 MG PO TABS
25.0000 mg | ORAL_TABLET | Freq: Two times a day (BID) | ORAL | Status: DC
  Administered 2020-05-23 – 2020-05-26 (×4): 25 mg via ORAL
  Filled 2020-05-23: qty 1
  Filled 2020-05-23: qty 2
  Filled 2020-05-23 (×2): qty 1

## 2020-05-23 NOTE — ED Triage Notes (Signed)
Patient c/o abdominal pain and bloating x 2 days ago.

## 2020-05-23 NOTE — ED Notes (Signed)
Patient transported to CT 

## 2020-05-23 NOTE — Consult Note (Signed)
I have been asked to see the patient by Dr. Holli Humbles, for evaluation and management of hydronephrosis with AKI.  History of present illness: 54 yo woman with hx of nephrolithiasis most recently s/p  bilateral ureteroscopy with Dr. Louis Meckel in April 2021 who presented to ED with abdominal pain that has been worsening over the past week.  CT abd/pelvis shows enlarged leiomyomatous uterus (19x20x14cm) causing extrinsic compression of ureters with distension of bilateral proximal ureters and pelvises.  Patient denies flank pain, dysuria, or hematuria.     Review of systems: A 12 point comprehensive review of systems was obtained and is negative unless otherwise stated in the history of present illness.  Patient Active Problem List   Diagnosis Date Noted  . AKI (acute kidney injury) ([place]) 05/23/2020  . Chronic a-fib ([place]) 05/23/2020  . Hydroureter on right 05/23/2020  . Microalbuminuria 05/13/2020  . Non-ischemic cardiomyopathy ([place])   . Normocytic anemia   . Nephrolithiasis 02/14/2020  . Paroxysmal atrial fibrillation ([place]) 12/18/2019  . 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 Gross hematuria 12/18/2019  . Staghorn renal calculus 12/18/2019  . Chronic kidney disease (CKD), stage IV (severe) ([place]) 12/18/2019  . Umbilical hernia without obstruction and without gangrene 12/18/2019  . Uterine fibroid 10/25/2019  . Right hip pain   . Type 2 diabetes mellitus ([place])   . Essential hypertension   . CHF (congestive heart failure) ([place]) 10/21/2019  . Atrial fibrillation with RVR ([place]) 10/21/2019  . Atrial fibrillation with rapid ventricular response ([place]) 10/20/2019    No current facility-administered medications on file prior to encounter.   Current Outpatient Medications on File Prior to Encounter  Medication Sig Dispense Refill  . amLODipine (NORVASC) 5 MG tablet Take 1 tablet (5 mg total) by mouth daily. 30 tablet 6  . apixaban (ELIQUIS) 5 MG TABS tablet Take 1 tablet (5 mg total) by mouth 2  (two) times daily. Resume when bleeding has stopped in your urine. 60 tablet 6  . carvedilol (COREG) 25 MG tablet Take 1 tablet (25 mg total) by mouth 2 (two) times daily with a meal. 180 tablet 3  . ferrous sulfate 325 (65 FE) MG tablet Take 1 tablet (325 mg total) by mouth daily with breakfast. 30 tablet 3  . hydrALAZINE (APRESOLINE) 100 MG tablet Take 1 tablet (100 mg total) by mouth every 8 (eight) hours. 90 tablet 6  . insulin glargine (LANTUS) 100 UNIT/ML injection Inject 15 Units into the skin every morning.     . isosorbide mononitrate (IMDUR) 60 MG 24 hr tablet Take 1 tablet (60 mg total) by mouth daily. 30 tablet 6  . losartan (COZAAR) 50 MG tablet Take 1 tablet (50 mg total) by mouth daily. 90 tablet 3  . metolazone (ZAROXOLYN) 5 MG tablet Take only as directed by CHF clinic (Patient taking differently: Take 10 mg by mouth See admin instructions. Per CHF Clinic take 10mg  by mouth on 6.3.21 and 6.4.21.) 10 tablet 0  . mexiletine (MEXITIL) 150 MG capsule Take 2 capsules (300 mg total) by mouth every 12 (twelve) hours. 120 capsule 6  . Multiple Vitamins-Minerals (WOMENS MULTI PO) Take 1 tablet by mouth daily.    . potassium chloride (KLOR-CON) 10 MEQ tablet Take 4 tablets (40 mEq total) by mouth daily. 120 tablet 6  . sulfamethoxazole-trimethoprim (BACTRIM) 400-80 MG tablet Take 1 tablet by mouth daily. 30 tablet 2  . tamsulosin (FLOMAX) 0.4 MG CAPS capsule Take 0.4 mg by mouth daily.    Marland Kitchen torsemide (DEMADEX) 20  MG tablet Take 2 tablets (40 mg total) by mouth 2 (two) times daily. 120 tablet 6  . traMADol (ULTRAM) 50 MG tablet Take 1-2 tablets (50-100 mg total) by mouth every 6 (six) hours as needed for moderate pain. 15 tablet 0  . glucose blood test strip Use as instructed 100 each 12  . Lancets (ONETOUCH ULTRASOFT) lancets Check CBG twice a day 60 each 5    Past Medical History:  Diagnosis Date  . Atrial fibrillation with RVR ([place])   . [place] 01/2020  . CHF (congestive heart  failure) ([place]) 10/20/2019  . CKD (chronic kidney disease), stage IV ([place])   . Diabetes mellitus without complication ([place])   . DOE (dyspnea on exertion)    walking upstairs or up hill resolves in one minute  . Dyspnea    with activity  . Fibroids   . History of kidney stones   . History of recent blood transfusion 02/26/2020  . Hypertension   . Iron deficiency anemia   . Non-ischemic cardiomyopathy (HCC)    tachycardia induced  . Obese   . Peripheral edema   . Premature ventricular contractions (PVCs) ([facility])   . Umbilical hernia   . Umbilical hernia   . Wears glasses     Past Surgical History:  Procedure Laterality Date  . CHOLECYSTECTOMY    . CYSTOSCOPY/RETROGRADE/URETEROSCOPY Bilateral 04/03/2020   Procedure: CYSTOSCOPY/RETROGRADE/URETEROSCOPY;  Surgeon: Ardis Hughs, MD;  Location: [facility];  Service: Urology;  Laterality: Bilateral;  . IR FLUORO GUIDE CV LINE RIGHT  02/21/2020  . IR US GUIDE VASC ACCESS RIGHT  02/21/2020  . NEPHROLITHOTOMY Left 02/14/2020   Procedure: NEPHROLITHOTOMY PERCUTANEOUS/ SURGEON ACCESS/ LEFT PERCUTANEOUS NEPHROSTOMY TUBE PLACEMENT;  Surgeon: Ardis Hughs, MD;  Location: [facility];  Service: Urology;  Laterality: Left;  . NEPHROLITHOTOMY Left 02/21/2020   Procedure: NEPHROLITHOTOMY PERCUTANEOUS SECOND LOOK;  Surgeon: Ardis Hughs, MD;  Location: [facility];  Service: Urology;  Laterality: Left;  . NEPHROLITHOTOMY Left 02/26/2020   Procedure: NEPHROLITHOTOMY PERCUTANEOUS;  Surgeon: Ceasar Mons, MD;  Location: [facility];  Service: Urology;  Laterality: Left;  NEED 150 MIN  . NEPHROLITHOTOMY Right 03/24/2020   Procedure: NEPHROLITHOTOMY PERCUTANEOUS WITH ACCESS LEFT STENT REMOVAL;  Surgeon: Ardis Hughs, MD;  Location: [facility];  Service: Urology;  Laterality: Right;  . RIGHT HEART CATH N/A 11/09/2019   Procedure: RIGHT HEART CATH;  Surgeon: Jolaine Artist, MD;  Location: [facility];  Service: Cardiovascular;  Laterality:  N/A;  . WISDOM TOOTH EXTRACTION      Social History   Tobacco Use  . Smoking status: Never Smoker  . Smokeless tobacco: Never Used  Substance Use Topics  . Alcohol use: Not Currently  . Drug use: Never    Family History  Problem Relation Age of Onset  . Atrial fibrillation Mother   . Hypertension Mother   . Stroke Mother   . Diabetes Mother   . Diabetes Father   . Hypertension Father   . Diabetes Sister   . Hypertension Sister   . Diabetes Brother   . Hypertension Brother   . Diabetes Brother   . Heart attack Brother     PE: Vitals:   05/23/20 1416 05/23/20 1430 05/23/20 1500 05/23/20 1730  BP: 131/88 (!) 143/73 128/74 136/61  Pulse: 65 (!) 47 (!) 48 67  Resp: 15 16 16 15   Temp:      TempSrc:      SpO2: 93% 99% 93% 98%  Weight:      Height:       Patient appears to be in no acute distress  patient is alert and oriented x3 Atraumatic normocephalic head No increased work of breathing Regular sinus rhythm/rate Abdomen is soft, nontender, nondistended, no CVA; mild suprapubic tenderness Lower extremities are symmetric without appreciable edema Grossly neurologically intact No identifiable skin lesions  Recent Labs    05/23/20 1007  WBC 9.7  HGB 12.4  HCT 37.9   Recent Labs    05/23/20 0917 05/23/20 1007  NA 140 141  K 3.3* 3.4*  CL 100 100  CO2 29 29  GLUCOSE 194* 123*  BUN 36* 41*  CREATININE 3.24* 3.23*  CALCIUM 9.5 9.4   No results for input(s): LABPT, INR in the last 72 hours. No results for input(s): LABURIN in the last 72 hours. Results for orders placed or performed during the hospital encounter of 05/23/20  SARS Coronavirus 2 by RT PCR (hospital order, performed in [facility] hospital lab) Nasopharyngeal Nasopharyngeal Swab     Status: None   Collection Time: 05/23/20  3:05 PM   Specimen: Nasopharyngeal Swab  Result Value Ref Range Status   SARS Coronavirus 2 NEGATIVE NEGATIVE Final    Comment: (NOTE) SARS-CoV-2 target nucleic  acids are NOT DETECTED. The SARS-CoV-2 RNA is generally detectable in upper and lower respiratory specimens during the acute phase of infection. The lowest concentration of SARS-CoV-2 viral copies this assay can detect is 250 copies / mL. A negative result does not preclude SARS-CoV-2 infection and should not be used as the sole basis for treatment or other patient management decisions.  A negative result may occur with improper specimen collection / handling, submission of specimen other than nasopharyngeal swab, presence of viral mutation(s) within the areas targeted by this assay, and inadequate number of viral copies (<250 copies / mL). A negative result must be combined with clinical observations, patient history, and epidemiological information. Fact Sheet for Patients:   StrictlyIdeas.no Fact Sheet for Healthcare Providers: BankingDealers.co.za This test is not yet approved or cleared  by the [place] FDA and has been authorized for detection and/or diagnosis of SARS-CoV-2 by FDA under an Emergency Use Authorization (EUA).  This EUA will remain in effect (meaning this test can be used) for the duration of the COVID-19 declaration under Section 564(b)(1) of the Act, 21 U.S.C. section 360bbb-3(b)(1), unless the authorization is terminated or revoked sooner. Performed at [facility], [place] [address], [place], [place] [address]     Imaging: CT Abd/Pelvis 05/22/20 IMPRESSION: 1. Incarcerated fat containing umbilical hernia. 2. Bilateral nonobstructing renal calculi, stable. Calcifications along the posterior margins of the bilateral kidneys consistent with previous percutaneous nephrostomies, stable. 3. Enlarged leiomyomatous uterus. 4. Mild distension of the bilateral renal pelves and proximal ureters, likely due to extrinsic compression on the ureters by an enlarged leiomyomatous uterus. 5. Aortic  Atherosclerosis (ICD10-I70.0).  Imp: 54 yo woman with enlarged leiomyomatous uterus causing extrinsic compression of bilateral ureters in setting of AKI.  Creatinine has been variable but was 1.5 in March 2021 and is now  3.23.  Recommendations: -discussed with patient management of bilateral hydronephrosis due to extrinsic compression from uterus including observation, ureteral stent placement, and PCN tubes -patient is willing to try cystoscopy with ureteral stent placement which may then be beneficial for gyn if hysterectomy is planned -if stents fail (unable to place or hydro does not improve will proceed with PCN tubes) -NPO at midnight -risks and benefits  of stents discussed with patient (she has had them in the past and tolerated them well)  Thank you for involving me in this patient's care, I will continue to follow along.  Please page with any further questions or concerns. Jordon Kristiansen D Roben Tatsch

## 2020-05-23 NOTE — ED Provider Notes (Addendum)
[facility] DEPT Provider Note   CSN: [id number] Arrival date & time: 05/23/20  0160     History Chief Complaint  Patient presents with  . Abdominal Pain  . Bloated    Adrienne Lane is a 54 y.o. female.  HPI Patient presents with abdominal pain.  Some nausea and decreased oral intake.  Pain in the upper abdomen.  States abdomen is much larger and firmer than normal.  Swelling is in upper abdomen.  No fevers.  States she normally has bowel movements around 4 times a day has only had once a day for the last 2 days.  No dysuria.  Had blood work drawn at [facility] and creatinine has gone from around 2 2 weeks ago up to 3.2 now.  Potassium is 3.3.  Does have a history of fibroid uterus.  Does have a history of kidney stones and had to have recent stone retrieval.  Had ultrasound and x-ray done 2 days ago reassuring.  States the upper abdomen is full and swollen which is not typical for her.  Has known fibroid uterus.  Has known small umbilical hernia.    Past Medical History:  Diagnosis Date  . Atrial fibrillation with RVR ([place])   . [place] 01/2020  . CHF (congestive heart failure) ([place]) 10/20/2019  . CKD (chronic kidney disease), stage IV ([place])   . Diabetes mellitus without complication ([place])   . DOE (dyspnea on exertion)    walking upstairs or up hill resolves in one minute  . Dyspnea    with activity  . Fibroids   . History of kidney stones   . History of recent blood transfusion 02/26/2020  . Hypertension   . Iron deficiency anemia   . Non-ischemic cardiomyopathy (H[facility])    tachycardia induced  . Obese   . Peripheral edema   . Premature ventricular contractions (PVCs) ([facility])   . Umbilical hernia   . Umbilical hernia   . Wears glasses     Patient Active Problem List   Diagnosis Date Noted  . Microalbuminuria 05/13/2020  . Non-ischemic cardiomyopathy ([place])   . Normocytic anemia   . Nephrolithiasis 02/14/2020  . Paroxysmal atrial  fibrillation ([place]) 12/18/2019  . 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 Gross hematuria 12/18/2019  . Staghorn renal calculus 12/18/2019  . Chronic kidney disease (CKD), stage IV (severe) ([place]) 12/18/2019  . Umbilical hernia without obstruction and without gangrene 12/18/2019  . Uterine fibroid 10/25/2019  . Right hip pain   . Type 2 diabetes mellitus ([place])   . Essential hypertension   . CHF (congestive heart failure) ([place]) 10/21/2019  . Atrial fibrillation with RVR ([place]) 10/21/2019  . Atrial fibrillation with rapid ventricular response ([place]) 10/20/2019    Past Surgical History:  Procedure Laterality Date  . CHOLECYSTECTOMY    . CYSTOSCOPY/RETROGRADE/URETEROSCOPY Bilateral 04/03/2020   Procedure: CYSTOSCOPY/RETROGRADE/URETEROSCOPY;  Surgeon: Ardis Hughs, MD;  Location: [facility];  Service: Urology;  Laterality: Bilateral;  . IR FLUORO GUIDE CV LINE RIGHT  02/21/2020  . IR US GUIDE VASC ACCESS RIGHT  02/21/2020  . NEPHROLITHOTOMY Left 02/14/2020   Procedure: NEPHROLITHOTOMY PERCUTANEOUS/ SURGEON ACCESS/ LEFT PERCUTANEOUS NEPHROSTOMY TUBE PLACEMENT;  Surgeon: Ardis Hughs, MD;  Location: [facility];  Service: Urology;  Laterality: Left;  . NEPHROLITHOTOMY Left 02/21/2020   Procedure: NEPHROLITHOTOMY PERCUTANEOUS SECOND LOOK;  Surgeon: Ardis Hughs, MD;  Location: [facility];  Service: Urology;  Laterality: Left;  . NEPHROLITHOTOMY Left 02/26/2020   Procedure: NEPHROLITHOTOMY PERCUTANEOUS;  Surgeon: Lovena Neighbours,  Conception Oms, MD;  Location: [facility];  Service: Urology;  Laterality: Left;  NEED 150 MIN  . NEPHROLITHOTOMY Right 03/24/2020   Procedure: NEPHROLITHOTOMY PERCUTANEOUS WITH ACCESS LEFT STENT REMOVAL;  Surgeon: Ardis Hughs, MD;  Location: [facility];  Service: Urology;  Laterality: Right;  . RIGHT HEART CATH N/A 11/09/2019   Procedure: RIGHT HEART CATH;  Surgeon: Jolaine Artist, MD;  Location: [facility];  Service: Cardiovascular;  Laterality: N/A;  . WISDOM TOOTH EXTRACTION        OB History    Gravida  0   Para  0   Term  0   Preterm  0   AB  0   Living  0     SAB  0   TAB  0   Ectopic  0   Multiple  0   Live Births  0           Family History  Problem Relation Age of Onset  . Atrial fibrillation Mother   . Hypertension Mother   . Stroke Mother   . Diabetes Mother   . Diabetes Father   . Hypertension Father   . Diabetes Sister   . Hypertension Sister   . Diabetes Brother   . Hypertension Brother   . Diabetes Brother   . Heart attack Brother     Social History   Tobacco Use  . Smoking status: Never Smoker  . Smokeless tobacco: Never Used  Substance Use Topics  . Alcohol use: Not Currently  . Drug use: Never    Home Medications Prior to Admission medications   Medication Sig Start Date End Date Taking? Authorizing Provider  amLODipine (NORVASC) 5 MG tablet Take 1 tablet (5 mg total) by mouth daily. 02/04/20  Yes Bensimhon, Shaune Pascal, MD  apixaban (ELIQUIS) 5 MG TABS tablet Take 1 tablet (5 mg total) by mouth 2 (two) times daily. Resume when bleeding has stopped in your urine. 03/25/20  Yes Ardis Hughs, MD  carvedilol (COREG) 25 MG tablet Take 1 tablet (25 mg total) by mouth 2 (two) times daily with a meal. 12/31/19  Yes Clegg, Amy D, NP  ferrous sulfate 325 (65 FE) MG tablet Take 1 tablet (325 mg total) by mouth daily with breakfast. 02/22/20  Yes Haskel Schroeder, MD  hydrALAZINE (APRESOLINE) 100 MG tablet Take 1 tablet (100 mg total) by mouth every 8 (eight) hours. 11/12/19  Yes Clegg, Amy D, NP  insulin glargine (LANTUS) 100 UNIT/ML injection Inject 15 Units into the skin every morning.    Yes [provider]  isosorbide mononitrate (IMDUR) 60 MG 24 hr tablet Take 1 tablet (60 mg total) by mouth daily. 11/13/19  Yes Clegg, Amy D, NP  losartan (COZAAR) 50 MG tablet Take 1 tablet (50 mg total) by mouth daily. 05/13/20  Yes Nicolette Bang, DO  metolazone (ZAROXOLYN) 5 MG tablet Take only as directed  by CHF clinic Patient taking differently: Take 10 mg by mouth See admin instructions. Per CHF Clinic take 10mg  by mouth on 6.3.21 and 6.4.21. 05/02/20  Yes Bensimhon, Shaune Pascal, MD  mexiletine (MEXITIL) 150 MG capsule Take 2 capsules (300 mg total) by mouth every 12 (twelve) hours. 11/12/19  Yes Clegg, Amy D, NP  Multiple Vitamins-Minerals (WOMENS MULTI PO) Take 1 tablet by mouth daily.   Yes [provider]  potassium chloride (KLOR-CON) 10 MEQ tablet Take 4 tablets (40 mEq total) by mouth daily. 02/04/20  Yes Bensimhon, Shaune Pascal,  MD  sulfamethoxazole-trimethoprim (BACTRIM) 400-80 MG tablet Take 1 tablet by mouth daily. 04/10/20  Yes Haskel Schroeder, MD  tamsulosin (FLOMAX) 0.4 MG CAPS capsule Take 0.4 mg by mouth daily. 03/10/20  Yes [provider]  torsemide (DEMADEX) 20 MG tablet Take 2 tablets (40 mg total) by mouth 2 (two) times daily. 11/12/19  Yes Clegg, Amy D, NP  traMADol (ULTRAM) 50 MG tablet Take 1-2 tablets (50-100 mg total) by mouth every 6 (six) hours as needed for moderate pain. 03/25/20  Yes Ardis Hughs, MD  glucose blood test strip Use as instructed 11/19/19   Darrick Grinder D, NP  Lancets (ONETOUCH ULTRASOFT) lancets Check CBG twice a day 11/12/19   Darrick Grinder D, NP    Allergies    Adhesive [tape]  Review of Systems   Review of Systems  Constitutional: Negative for appetite change.  HENT: Negative for congestion.   Respiratory: Negative for shortness of breath.   Gastrointestinal: Positive for abdominal pain, nausea and vomiting. Negative for blood in stool.  Genitourinary: Negative for flank pain.  Musculoskeletal: Negative for back pain.  Skin: Negative for rash.  Neurological: Negative for weakness.    Physical Exam Updated Vital Signs BP (!) 143/73   Pulse (!) 47   Temp 97.6 F (36.4 C) (Oral)   Resp 16   Ht 5\' 8"  (1.727 m)   Wt 102.1 kg   LMP 05/12/2020 (Exact Date)   SpO2 99%   BMI 34.21 kg/m   Physical Exam Vitals and nursing  note reviewed.  HENT:     Head: Normocephalic.  Abdominal:     Hernia: A hernia is present.     Comments: Upper abdominal tenderness with mass.  Large over most of the upper abdomen.  Fair amount of tenderness.  Reducible umbilical hernia.  Skin:    General: Skin is warm.     Capillary Refill: Capillary refill takes less than 2 seconds.  Neurological:     Mental Status: She is alert.     ED Results / Procedures / Treatments   Labs (all labs ordered are listed, but only abnormal results are displayed) Labs Reviewed  COMPREHENSIVE METABOLIC PANEL - Abnormal; Notable for the following components:      Result Value   Potassium 3.4 (*)    Glucose, Bld 123 (*)    BUN 41 (*)    Creatinine, Ser 3.23 (*)    GFR calc non Af Amer 16 (*)    GFR calc Af Amer 18 (*)    All other components within normal limits  URINALYSIS, ROUTINE W REFLEX MICROSCOPIC - Abnormal; Notable for the following components:   Color, Urine STRAW (*)    Protein, ur 100 (*)    Leukocytes,Ua TRACE (*)    Bacteria, UA RARE (*)    All other components within normal limits  SARS CORONAVIRUS 2 BY RT PCR [facility] ORDER, [place] [facility])  LIPASE, BLOOD  CBC    EKG EKG Interpretation  Date/Time:  Friday May 23 2020 13:09:18 EDT Ventricular Rate:  77 PR Interval:    QRS Duration: 99 QT Interval:  435 QTC Calculation: 493 R Axis:   -3 Text Interpretation: Sinus rhythm Ventricular trigeminy Left ventricular hypertrophy Borderline prolonged QT interval Confirmed by Davonna Belling [phone]) on 05/23/2020 1:34:38 PM   Radiology CT ABDOMEN PELVIS WO CONTRAST  Result Date: 05/23/2020 CLINICAL DATA:  Nausea, abdominal pain EXAM: CT ABDOMEN AND PELVIS WITHOUT CONTRAST TECHNIQUE: Multidetector CT imaging of  the abdomen and pelvis was performed following the standard protocol without IV contrast. COMPARISON:  05/23/2020, 03/25/2020 FINDINGS: Lower chest: No acute pleural or parenchymal lung disease.  Hepatobiliary: No focal liver abnormality is seen. Status post cholecystectomy. No biliary dilatation. Pancreas: Unremarkable. No pancreatic ductal dilatation or surrounding inflammatory changes. Spleen: Normal in size without focal abnormality. Adrenals/Urinary Tract: Bilateral nonobstructing renal calculi are identified. Within the lower pole right kidney there are 2 discrete calyceal calculi largest measuring 3 mm. Increasing calcification in the perinephric tissues adjacent to the lower pole right kidney measuring 6 mm likely reflects sequela from prior percutaneous nephrostomy. Multiple nonobstructing left renal calculi are seen, largest in the lower pole calices measuring up to 15 mm. Again, there are linear calcifications extending through the posterior aspect of the interpolar left kidney likely representing sequela from previous percutaneous nephrostomy, stable. There is mild distension of the bilateral renal pelves and proximal ureters, likely due to extrinsic compression on the ureters by an enlarged leiomyomatous uterus. No distal urinary tract calculi. The bladder is minimally distended with no gross abnormalities. The adrenals are stable. Stomach/Bowel: No bowel obstruction or ileus. Large diverticulum off the 2/3 portion duodenum again noted, without complication. No bowel wall thickening or inflammatory change. Normal appendix right lower quadrant. Vascular/Lymphatic: Aortic atherosclerosis. No enlarged abdominal or pelvic lymph nodes. Reproductive: Enlarged leiomyomatous uterus is again identified, measuring 19.2 by 20.4 x 14.3 cm. There are no adnexal masses. Other: There is a fat containing umbilical hernia. Significant fat stranding is seen within the herniated mesenteric fat, consistent with incarcerated hernia. No bowel herniation. There is no free fluid or free gas. Musculoskeletal: There are no acute or destructive bony lesions. Stable symmetrical sclerosis of the sacroiliac joints.  Reconstructed images demonstrate no additional findings. IMPRESSION: 1. Incarcerated fat containing umbilical hernia. 2. Bilateral nonobstructing renal calculi, stable. Calcifications along the posterior margins of the bilateral kidneys consistent with previous percutaneous nephrostomies, stable. 3. Enlarged leiomyomatous uterus. 4. Mild distension of the bilateral renal pelves and proximal ureters, likely due to extrinsic compression on the ureters by an enlarged leiomyomatous uterus. 5. Aortic Atherosclerosis (ICD10-I70.0). Electronically Signed   By: Randa Ngo M.D.   On: 05/23/2020 15:44   DG Abdomen Acute W/Chest  Result Date: 05/23/2020 CLINICAL DATA:  Abdominal pain with bloating EXAM: DG ABDOMEN ACUTE W/ 1V CHEST COMPARISON:  Abdominal radiograph May 21, 2020. CT abdomen and pelvis March 25, 2020 FINDINGS: PA chest: Lungs are clear. Heart size and pulmonary vascularity are normal. No adenopathy. Supine and upright abdomen: Pelvic mass better appreciated on CT and more recent abdominal radiographic examination. There is no appreciable bowel dilatation or air-fluid level to suggest bowel obstruction. No free air. There are surgical clips in the right upper quadrant. Scattered renal calculi again noted, largest seen on the left measuring approximately 1 cm. Surgical clips right upper quadrant. IMPRESSION: 1.  No bowel obstruction or free air. 2.  Multiple renal calculi bilaterally, larger on the left, stable. 3. Pelvic mass, less well seen currently than on previous study. Mass known to represent enlarged leiomyomatous uterus. 4.  Lungs clear. Electronically Signed   By: Lowella Grip III M.D.   On: 05/23/2020 14:09    Procedures Procedures (including critical care time)  Medications Ordered in ED Medications  sodium chloride flush (NS) 0.9 % injection 3 mL ( Intravenous Canceled Entry 05/23/20 1316)  ondansetron (ZOFRAN) injection 4 mg (4 mg Intravenous Given 05/23/20 1259)  fentaNYL  (SUBLIMAZE) injection 50 mcg (50 mcg Intravenous  Given 05/23/20 1259)  sodium chloride 0.9 % bolus 500 mL (0 mLs Intravenous Stopped 05/23/20 1337)    ED Course  I have reviewed the triage vital signs and the nursing notes.  Pertinent labs & imaging results that were available during my care of the patient were reviewed by me and considered in my medical decision making (see chart for details).    MDM Rules/Calculators/A&P                      Patient abdominal with known fibroid uterus and known kidney stones.  Has had creatinine go from 2 up to 3.  Known heart failure.  With decreased oral intake patient may benefit from admission to the hospital with acute kidney injury.  However with the mass in the upper abdomen potentially could be the fibroid uterus will get CT scan.    CT scan done and does show a large fibroid uterus.  Potentially could be a area of enteritis versus peristalsis in the left upper abdomen.  She is tender in this area so potentially could be enteritis.  Does have some hydronephrosis secondary to the ureters being compressed from the fibroids.  Will admit to internal medicine. Final Clinical Impression(s) / ED Diagnoses Final diagnoses:  AKI (acute kidney injury) [facility])    Rx / [place] Orders ED Discharge Orders    None       Davonna Belling, MD 05/23/20 1500    Davonna Belling, MD 05/23/20 1554

## 2020-05-23 NOTE — H&P (View-Only) (Signed)
I have been asked to see the patient by Dr. Holli Humbles, for evaluation and management of hydronephrosis with AKI.  History of present illness: 54 yo woman with hx of nephrolithiasis most recently s/p  bilateral ureteroscopy with Dr. Louis Meckel in April 2021 who presented to ED with abdominal pain that has been worsening over the past week.  CT abd/pelvis shows enlarged leiomyomatous uterus (19x20x14cm) causing extrinsic compression of ureters with distension of bilateral proximal ureters and pelvises.  Patient denies flank pain, dysuria, or hematuria.     Review of systems: A 12 point comprehensive review of systems was obtained and is negative unless otherwise stated in the history of present illness.  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   Current Outpatient Medications on File Prior to Encounter  Medication Sig Dispense Refill   amLODipine (NORVASC) 5 MG tablet Take 1 tablet (5 mg total) by mouth daily. 30 tablet 6   apixaban (ELIQUIS) 5 MG TABS tablet Take 1 tablet (5 mg total) by mouth 2  (two) times daily. Resume when bleeding has stopped in your urine. 60 tablet 6   carvedilol (COREG) 25 MG tablet Take 1 tablet (25 mg total) by mouth 2 (two) times daily with a meal. 180 tablet 3   ferrous sulfate 325 (65 FE) MG tablet Take 1 tablet (325 mg total) by mouth daily with breakfast. 30 tablet 3   hydrALAZINE (APRESOLINE) 100 MG tablet Take 1 tablet (100 mg total) by mouth every 8 (eight) hours. 90 tablet 6   insulin glargine (LANTUS) 100 UNIT/ML injection Inject 15 Units into the skin every morning.      isosorbide mononitrate (IMDUR) 60 MG 24 hr tablet Take 1 tablet (60 mg total) by mouth daily. 30 tablet 6   losartan (COZAAR) 50 MG tablet Take 1 tablet (50 mg total) by mouth daily. 90 tablet 3   metolazone (ZAROXOLYN) 5 MG tablet Take only as directed by CHF clinic (Patient taking differently: Take 10 mg by mouth See admin instructions. Per CHF Clinic take 10mg  by mouth on 6.3.21 and 6.4.21.) 10 tablet 0   mexiletine (MEXITIL) 150 MG capsule Take 2 capsules (300 mg total) by mouth every 12 (twelve) hours. 120 capsule 6   Multiple Vitamins-Minerals (WOMENS MULTI PO) Take 1 tablet by mouth daily.     potassium chloride (KLOR-CON) 10 MEQ tablet Take 4 tablets (40 mEq total) by mouth daily. 120 tablet 6   sulfamethoxazole-trimethoprim (BACTRIM) 400-80 MG tablet Take 1 tablet by mouth daily. 30 tablet 2   tamsulosin (FLOMAX) 0.4 MG CAPS capsule Take 0.4 mg by mouth daily.     torsemide (DEMADEX) 20  MG tablet Take 2 tablets (40 mg total) by mouth 2 (two) times daily. 120 tablet 6   traMADol (ULTRAM) 50 MG tablet Take 1-2 tablets (50-100 mg total) by mouth every 6 (six) hours as needed for moderate pain. 15 tablet 0   glucose blood test strip Use as instructed 100 each 12   Lancets (ONETOUCH ULTRASOFT) lancets Check CBG twice a day 60 each 5    Past Medical History:  Diagnosis Date   Atrial fibrillation with RVR ([place])    [place] 01/2020   CHF (congestive heart  failure) ([place]) 10/20/2019   CKD (chronic kidney disease), stage IV ([facility])    Diabetes mellitus without complication ([place])    DOE (dyspnea on exertion)    walking upstairs or up hill resolves in one minute   Dyspnea    with activity   Fibroids    History of kidney stones    History of recent blood transfusion 02/26/2020   Hypertension    Iron deficiency anemia    Non-ischemic cardiomyopathy ([facility])    tachycardia induced   Obese    Peripheral edema    Premature ventricular contractions (PVCs) ([facility])    Umbilical hernia    Umbilical hernia    Wears glasses     Past Surgical History:  Procedure Laterality Date   CHOLECYSTECTOMY     CYSTOSCOPY/RETROGRADE/URETEROSCOPY Bilateral 04/03/2020   Procedure: CYSTOSCOPY/RETROGRADE/URETEROSCOPY;  Surgeon: Ardis Hughs, MD;  Location: [facility];  Service: Urology;  Laterality: Bilateral;   IR FLUORO GUIDE CV LINE RIGHT  02/21/2020   IR US GUIDE VASC ACCESS RIGHT  02/21/2020   NEPHROLITHOTOMY Left 02/14/2020   Procedure: NEPHROLITHOTOMY PERCUTANEOUS/ SURGEON ACCESS/ LEFT PERCUTANEOUS NEPHROSTOMY TUBE PLACEMENT;  Surgeon: Ardis Hughs, MD;  Location: [facility];  Service: Urology;  Laterality: Left;   NEPHROLITHOTOMY Left 02/21/2020   Procedure: NEPHROLITHOTOMY PERCUTANEOUS SECOND LOOK;  Surgeon: Ardis Hughs, MD;  Location: [facility];  Service: Urology;  Laterality: Left;   NEPHROLITHOTOMY Left 02/26/2020   Procedure: NEPHROLITHOTOMY PERCUTANEOUS;  Surgeon: Ceasar Mons, MD;  Location: [facility];  Service: Urology;  Laterality: Left;  NEED 150 MIN   NEPHROLITHOTOMY Right 03/24/2020   Procedure: NEPHROLITHOTOMY PERCUTANEOUS WITH ACCESS LEFT STENT REMOVAL;  Surgeon: Ardis Hughs, MD;  Location: [facility];  Service: Urology;  Laterality: Right;   RIGHT HEART CATH N/A 11/09/2019   Procedure: RIGHT HEART CATH;  Surgeon: Jolaine Artist, MD;  Location: [facility];  Service: Cardiovascular;  Laterality:  N/A;   WISDOM TOOTH EXTRACTION      Social History   Tobacco Use   Smoking status: Never Smoker   Smokeless tobacco: Never Used  Substance Use Topics   Alcohol use: Not Currently   Drug use: Never    Family History  Problem Relation Age of Onset   Atrial fibrillation Mother    Hypertension Mother    Stroke Mother    Diabetes Mother    Diabetes Father    Hypertension Father    Diabetes Sister    Hypertension Sister    Diabetes Brother    Hypertension Brother    Diabetes Brother    Heart attack Brother     PE: Vitals:   05/23/20 1416 05/23/20 1430 05/23/20 1500 05/23/20 1730  BP: 131/88 (!) 143/73 128/74 136/61  Pulse: 65 (!) 47 (!) 48 67  Resp: 15 16 16 15   Temp:      TempSrc:      SpO2: 93% 99% 93% 98%  Weight:      Height:       Patient appears to be in no acute distress  patient is alert and oriented x3 Atraumatic normocephalic head No increased work of breathing Regular sinus rhythm/rate Abdomen is soft, nontender, nondistended, no CVA; mild suprapubic tenderness Lower extremities are symmetric without appreciable edema Grossly neurologically intact No identifiable skin lesions  Recent Labs    05/23/20 1007  WBC 9.7  HGB 12.4  HCT 37.9   Recent Labs    05/23/20 0917 05/23/20 1007  NA 140 141  K 3.3* 3.4*  CL 100 100  CO2 29 29  GLUCOSE 194* 123*  BUN 36* 41*  CREATININE 3.24* 3.23*  CALCIUM 9.5 9.4   No results for input(s): LABPT, INR in the last 72 hours. No results for input(s): LABURIN in the last 72 hours. Results for orders placed or performed during the hospital encounter of 05/23/20  SARS Coronavirus 2 by RT PCR (hospital order, performed in [facility] hospital lab) Nasopharyngeal Nasopharyngeal Swab     Status: None   Collection Time: 05/23/20  3:05 PM   Specimen: Nasopharyngeal Swab  Result Value Ref Range Status   SARS Coronavirus 2 NEGATIVE NEGATIVE Final    Comment: (NOTE) SARS-CoV-2 target nucleic  acids are NOT DETECTED. The SARS-CoV-2 RNA is generally detectable in upper and lower respiratory specimens during the acute phase of infection. The lowest concentration of SARS-CoV-2 viral copies this assay can detect is 250 copies / mL. A negative result does not preclude SARS-CoV-2 infection and should not be used as the sole basis for treatment or other patient management decisions.  A negative result may occur with improper specimen collection / handling, submission of specimen other than nasopharyngeal swab, presence of viral mutation(s) within the areas targeted by this assay, and inadequate number of viral copies (<250 copies / mL). A negative result must be combined with clinical observations, patient history, and epidemiological information. Fact Sheet for Patients:   StrictlyIdeas.no Fact Sheet for Healthcare Providers: BankingDealers.co.za This test is not yet approved or cleared  by the [place] FDA and has been authorized for detection and/or diagnosis of SARS-CoV-2 by FDA under an Emergency Use Authorization (EUA).  This EUA will remain in effect (meaning this test can be used) for the duration of the COVID-19 declaration under Section 564(b)(1) of the Act, 21 U.S.C. section 360bbb-3(b)(1), unless the authorization is terminated or revoked sooner. Performed at [facility], [place] [address] [place], [place] [address]     Imaging: CT Abd/Pelvis 05/22/20 IMPRESSION: 1. Incarcerated fat containing umbilical hernia. 2. Bilateral nonobstructing renal calculi, stable. Calcifications along the posterior margins of the bilateral kidneys consistent with previous percutaneous nephrostomies, stable. 3. Enlarged leiomyomatous uterus. 4. Mild distension of the bilateral renal pelves and proximal ureters, likely due to extrinsic compression on the ureters by an enlarged leiomyomatous uterus. 5. Aortic  Atherosclerosis (ICD10-I70.0).  Imp: 54 yo woman with enlarged leiomyomatous uterus causing extrinsic compression of bilateral ureters in setting of AKI.  Creatinine has been variable but was 1.5 in March 2021 and is now  3.23.  Recommendations: -discussed with patient management of bilateral hydronephrosis due to extrinsic compression from uterus including observation, ureteral stent placement, and PCN tubes -patient is willing to try cystoscopy with ureteral stent placement which may then be beneficial for gyn if hysterectomy is planned -if stents fail (unable to place or hydro does not improve will proceed with PCN tubes) -NPO at midnight -risks and benefits  of stents discussed with patient (she has had them in the past and tolerated them well)  Thank you for involving me in this patient's care, I will continue to follow along.  Please page with any further questions or concerns. Merilyn Pagan D Amiliana Foutz

## 2020-05-23 NOTE — ED Notes (Signed)
Patient given meal tray.

## 2020-05-23 NOTE — H&P (Addendum)
History and Physical    Adrienne Lane UEA:[id number] DOB: 03-07-1966 DOA: 05/23/2020  PCP: Nicolette Bang, DO   Chief Complaint: Abdominal pain  HPI: Adrienne Lane is a 54 y.o. female with medical history significant of A. fib, diastolic heart failure EF 50 to 55%, CKD 4, fibroid uterus, history of nephrolithiasis, iron deficiency anemia, hypertension, obesity.  Patient presents with approximately 1 week of abdominal pain and changes in bowel and bladder frequency.  She indicates fewer bowel movements, typically for a day now approximately 1 a day, also indicates decreased urine output.  Nominal pain is periumbilical and epigastric, worse with movement and palpation, improved with rest.  No improvement with over-the-counter medications, heat or ice.  ED Course: Presents to the ED as above after 1 week of abdominal pain, CT in the ED remarkable for possible right-sided hydronephrosis questionably bilateral, large fibroid uterus as well as compression of colon with questionable inflammatory changes.  Patient's labs are remarkable for elevated creatinine at 3.24 with otherwise normal white count mild hypokalemia at 3.4 and depressed GFR.  Given elevated creatinine, intractable abdominal pain poor p.o. intake and changes in bowel and bladder habits hospitalist was called for further evaluation and admission.  Review of Systems: As per HPI denies nausea, vomiting, diarrhea, headache, fever, chills, shortness of breath, chest pain.  She does remark on constipation, decreased urinary output, epigastric abdominal pain.   Assessment/Plan Principal Problem:   AKI (acute kidney injury) ([place]) Active Problems:   CHF (congestive heart failure) (HCC)   Essential hypertension   Uterine fibroid   Frequent PVCs   Chronic kidney disease (CKD), stage IV (severe) (HCC)   Chronic a-fib (HCC)   Hydroureter on right   AKI concerning for obstructive process given hydroureter on baseline CKD 4, POA    -Continue IV fluids cautiously given heart failure diagnosis as below -Creatinine moderately elevated from baseline (around 1.9-2.0) -Urology consulted for possible need for stent placement over the weekend -Of note recently had bilateral staghorn calculi removed, could have residual enlarged hydroureter, defer to urology whether or not this appears to be more acute in the setting of obstruction or residual Lab Results  Component Value Date   CREATININE 3.23 (H) 05/23/2020   CREATININE 3.24 (H) 05/23/2020   CREATININE 1.94 (H) 05/05/2020   Large fibroid uterus, concerning for obstruction -OB/GYN has been consulted for further evaluation and treatment, patient was previously seen by Dr. Roselie Awkward who discussed surgical removal of fibroid held off due to patient's new onset A. fib and heart failure diagnoses that needed to be better controlled which they now are. -At this time once patient's creatinine improves patient would appears to be medically stable for procedure from our standpoint  Constipation, secondary to above -Continue to monitor, will offer Dulcolax Senokot or MiraLAX as needed  Heart failure, diastolic, EF 50 to 47%, no acute exacerbation -Appears to be well controlled on home regimen: Continue carvedilol, currently holding ARB and diuretic in the setting of AKI as above -Monitor closely given IV fluids as above  A. fib, rate controlled, frequent PVCs -Rate controlled, continue home medications: Carvedilol  History of nephrolithiasis status post bilateral staghorn calculi removal earlier this year -As above, possibly somewhat subacute or chronic hydroureter cannot be ruled out, defer to urology  Anemia, iron deficiency, at baseline -Continue home iron supplementation  Hypertension, well controlled -Resume home medications, currently well controlled on home amlodipine, carvedilol, hydralazine, isosorbide -holding diuretics as above  Diabetes, poorly controlled -Hold home  p.o. medications,  continue sliding scale Lab Results  Component Value Date   HGBA1C 8.1 (A) 05/12/2020   DVT prophylaxis: Heparin Code Status: Full Family Communication: Husband at bedside Status is: Observation  Dispo: The patient is from: Home              Anticipated d/c is to: Home              Anticipated d/c date is: Pending further evaluation and treatment likely 48 to 72 hours pending specialist evaluation and possible need for further procedure or surgery.              Patient currently not medically stable for discharge due to need for IV fluids, IV pain medications, further imaging and evaluation by specialist to ensure no need for further evaluation procedure or surgery.  Consultants:   OB/GYN, Dr. Rip Harbour  Urology Dr. Claudia Desanctis  Procedures:   Defer to specialists/consults -none currently planned   Past Medical History:  Diagnosis Date  . Atrial fibrillation with RVR ([place])   . [place] 01/2020  . CHF (congestive heart failure) ([place]) 10/20/2019  . CKD (chronic kidney disease), stage IV ([place])   . Diabetes mellitus without complication ([place])   . DOE (dyspnea on exertion)    walking upstairs or up hill resolves in one minute  . Dyspnea    with activity  . Fibroids   . History of kidney stones   . History of recent blood transfusion 02/26/2020  . Hypertension   . Iron deficiency anemia   . Non-ischemic cardiomyopathy (HCC)    tachycardia induced  . Obese   . Peripheral edema   . Premature ventricular contractions (PVCs) ([facility])   . Umbilical hernia   . Umbilical hernia   . Wears glasses     Past Surgical History:  Procedure Laterality Date  . CHOLECYSTECTOMY    . CYSTOSCOPY/RETROGRADE/URETEROSCOPY Bilateral 04/03/2020   Procedure: CYSTOSCOPY/RETROGRADE/URETEROSCOPY;  Surgeon: Ardis Hughs, MD;  Location: [facility];  Service: Urology;  Laterality: Bilateral;  . IR FLUORO GUIDE CV LINE RIGHT  02/21/2020  . IR US GUIDE VASC ACCESS RIGHT  02/21/2020  .  NEPHROLITHOTOMY Left 02/14/2020   Procedure: NEPHROLITHOTOMY PERCUTANEOUS/ SURGEON ACCESS/ LEFT PERCUTANEOUS NEPHROSTOMY TUBE PLACEMENT;  Surgeon: Ardis Hughs, MD;  Location: [facility];  Service: Urology;  Laterality: Left;  . NEPHROLITHOTOMY Left 02/21/2020   Procedure: NEPHROLITHOTOMY PERCUTANEOUS SECOND LOOK;  Surgeon: Ardis Hughs, MD;  Location: [facility];  Service: Urology;  Laterality: Left;  . NEPHROLITHOTOMY Left 02/26/2020   Procedure: NEPHROLITHOTOMY PERCUTANEOUS;  Surgeon: Ceasar Mons, MD;  Location: [facility];  Service: Urology;  Laterality: Left;  NEED 150 MIN  . NEPHROLITHOTOMY Right 03/24/2020   Procedure: NEPHROLITHOTOMY PERCUTANEOUS WITH ACCESS LEFT STENT REMOVAL;  Surgeon: Ardis Hughs, MD;  Location: [facility];  Service: Urology;  Laterality: Right;  . RIGHT HEART CATH N/A 11/09/2019   Procedure: RIGHT HEART CATH;  Surgeon: Jolaine Artist, MD;  Location: [facility];  Service: Cardiovascular;  Laterality: N/A;  . WISDOM TOOTH EXTRACTION       reports that she has never smoked. She has never used smokeless tobacco. She reports previous alcohol use. She reports that she does not use drugs.  Allergies  Allergen Reactions  . Adhesive [Tape]     Tears skin, can tolerate paper tape    Family History  Problem Relation Age of Onset  . Atrial fibrillation Mother   . Hypertension Mother   . Stroke Mother   .  Diabetes Mother   . Diabetes Father   . Hypertension Father   . Diabetes Sister   . Hypertension Sister   . Diabetes Brother   . Hypertension Brother   . Diabetes Brother   . Heart attack Brother     Prior to Admission medications   Medication Sig Start Date End Date Taking? Authorizing Provider  amLODipine (NORVASC) 5 MG tablet Take 1 tablet (5 mg total) by mouth daily. 02/04/20  Yes Bensimhon, Shaune Pascal, MD  apixaban (ELIQUIS) 5 MG TABS tablet Take 1 tablet (5 mg total) by mouth 2 (two) times daily. Resume when bleeding has stopped in  your urine. 03/25/20  Yes Ardis Hughs, MD  carvedilol (COREG) 25 MG tablet Take 1 tablet (25 mg total) by mouth 2 (two) times daily with a meal. 12/31/19  Yes Clegg, Amy D, NP  ferrous sulfate 325 (65 FE) MG tablet Take 1 tablet (325 mg total) by mouth daily with breakfast. 02/22/20  Yes Haskel Schroeder, MD  hydrALAZINE (APRESOLINE) 100 MG tablet Take 1 tablet (100 mg total) by mouth every 8 (eight) hours. 11/12/19  Yes Clegg, Amy D, NP  insulin glargine (LANTUS) 100 UNIT/ML injection Inject 15 Units into the skin every morning.    Yes [provider]  isosorbide mononitrate (IMDUR) 60 MG 24 hr tablet Take 1 tablet (60 mg total) by mouth daily. 11/13/19  Yes Clegg, Amy D, NP  losartan (COZAAR) 50 MG tablet Take 1 tablet (50 mg total) by mouth daily. 05/13/20  Yes Nicolette Bang, DO  metolazone (ZAROXOLYN) 5 MG tablet Take only as directed by CHF clinic Patient taking differently: Take 10 mg by mouth See admin instructions. Per CHF Clinic take 10mg  by mouth on 6.3.21 and 6.4.21. 05/02/20  Yes Bensimhon, Shaune Pascal, MD  mexiletine (MEXITIL) 150 MG capsule Take 2 capsules (300 mg total) by mouth every 12 (twelve) hours. 11/12/19  Yes Clegg, Amy D, NP  Multiple Vitamins-Minerals (WOMENS MULTI PO) Take 1 tablet by mouth daily.   Yes [provider]  potassium chloride (KLOR-CON) 10 MEQ tablet Take 4 tablets (40 mEq total) by mouth daily. 02/04/20  Yes Bensimhon, Shaune Pascal, MD  sulfamethoxazole-trimethoprim (BACTRIM) 400-80 MG tablet Take 1 tablet by mouth daily. 04/10/20  Yes Haskel Schroeder, MD  tamsulosin (FLOMAX) 0.4 MG CAPS capsule Take 0.4 mg by mouth daily. 03/10/20  Yes [provider]  torsemide (DEMADEX) 20 MG tablet Take 2 tablets (40 mg total) by mouth 2 (two) times daily. 11/12/19  Yes Clegg, Amy D, NP  traMADol (ULTRAM) 50 MG tablet Take 1-2 tablets (50-100 mg total) by mouth every 6 (six) hours as needed for moderate pain. 03/25/20  Yes Ardis Hughs, MD  glucose blood test strip Use as instructed 11/19/19   Darrick Grinder D, NP  Lancets [facility] ULTRASOFT) lancets Check CBG twice a day 11/12/19   Darrick Grinder D, NP    Physical Exam: Vitals:   05/23/20 1330 05/23/20 1416 05/23/20 1430 05/23/20 1500  BP: 132/66 131/88 (!) 143/73 128/74  Pulse: 87 65 (!) 47 (!) 48  Resp: 18 15 16 16   Temp:      TempSrc:      SpO2: 95% 93% 99% 93%  Weight:      Height:        Constitutional: NAD, calm, comfortable Vitals:   05/23/20 1330 05/23/20 1416 05/23/20 1430 05/23/20 1500  BP: 132/66 131/88 (!) 143/73 128/74  Pulse: 87 65 (!) 47 (!)  48  Resp: 18 15 16 16   Temp:      TempSrc:      SpO2: 95% 93% 99% 93%  Weight:      Height:       General:  Pleasantly resting in bed, No acute distress. HEENT:  Normocephalic atraumatic.  Sclerae nonicteric, noninjected.  Extraocular movements intact bilaterally. Neck:  Without mass or deformity.  Trachea is midline. Lungs:  Clear to auscultate bilaterally without rhonchi, wheeze, or rales. Heart: Irregularly irregular - without murmurs, rubs, or gallops. Abdomen: Soft, moderately tender at the epigastrium otherwise without distention guarding or rebound. Extremities: Without cyanosis, clubbing, edema, or obvious deformity. Vascular:  Dorsalis pedis and posterior tibial pulses palpable bilaterally. Skin:  Warm and dry, no erythema, no ulcerations.   Labs on Admission: I have personally reviewed following labs and imaging studies  CBC: Recent Labs  Lab 05/23/20 1007  WBC 9.7  HGB 12.4  HCT 37.9  MCV 90.5  PLT 505   Basic Metabolic Panel: Recent Labs  Lab 05/23/20 0917 05/23/20 1007  NA 140 141  K 3.3* 3.4*  CL 100 100  CO2 29 29  GLUCOSE 194* 123*  BUN 36* 41*  CREATININE 3.24* 3.23*  CALCIUM 9.5 9.4   GFR: Estimated Creatinine Clearance: 25.5 mL/min (A) (by C-G formula based on SCr of 3.23 mg/dL (H)). Liver Function Tests: Recent Labs  Lab 05/23/20 1007  AST 20  ALT 15    ALKPHOS 75  BILITOT 0.8  PROT 7.8  ALBUMIN 3.6   Recent Labs  Lab 05/23/20 1007  LIPASE 21   No results for input(s): AMMONIA in the last 168 hours. Coagulation Profile: No results for input(s): INR, PROTIME in the last 168 hours. Cardiac Enzymes: No results for input(s): CKTOTAL, CKMB, CKMBINDEX, TROPONINI in the last 168 hours. BNP (last 3 results) No results for input(s): PROBNP in the last 8760 hours. HbA1C: No results for input(s): HGBA1C in the last 72 hours. CBG: No results for input(s): GLUCAP in the last 168 hours. Lipid Profile: No results for input(s): CHOL, HDL, LDLCALC, TRIG, CHOLHDL, LDLDIRECT in the last 72 hours. Thyroid Function Tests: No results for input(s): TSH, T4TOTAL, FREET4, T3FREE, THYROIDAB in the last 72 hours. Anemia Panel: No results for input(s): VITAMINB12, FOLATE, FERRITIN, TIBC, IRON, RETICCTPCT in the last 72 hours. Urine analysis:    Component Value Date/Time   COLORURINE STRAW (A) 05/23/2020 1400   APPEARANCEUR CLEAR 05/23/2020 1400   LABSPEC 1.006 05/23/2020 1400   PHURINE 7.0 05/23/2020 1400   GLUCOSEU NEGATIVE 05/23/2020 1400   HGBUR NEGATIVE 05/23/2020 1400   BILIRUBINUR NEGATIVE 05/23/2020 1400   KETONESUR NEGATIVE 05/23/2020 1400   PROTEINUR 100 (A) 05/23/2020 1400   NITRITE NEGATIVE 05/23/2020 1400   LEUKOCYTESUR TRACE (A) 05/23/2020 1400    Radiological Exams on Admission: CT ABDOMEN PELVIS WO CONTRAST  Addendum Date: 05/23/2020   ADDENDUM REPORT: 05/23/2020 15:58 ADDENDUM: The case was reviewed with Dr. Alvino Chapel. On physical exam the patient is tender within the left mid abdomen. There is a short segment of jejunum on images 36 through 46 demonstrating wall thickening. There is some mild mesenteric fat stranding in this region as well. Differential would include short segment inflammatory/infectious enteritis versus decompressed bowel due to peristalsis. Electronically Signed   By: Randa Ngo M.D.   On: 05/23/2020 15:58    Result Date: 05/23/2020 CLINICAL DATA:  Nausea, abdominal pain EXAM: CT ABDOMEN AND PELVIS WITHOUT CONTRAST TECHNIQUE: Multidetector CT imaging of the abdomen and pelvis  was performed following the standard protocol without IV contrast. COMPARISON:  05/23/2020, 03/25/2020 FINDINGS: Lower chest: No acute pleural or parenchymal lung disease. Hepatobiliary: No focal liver abnormality is seen. Status post cholecystectomy. No biliary dilatation. Pancreas: Unremarkable. No pancreatic ductal dilatation or surrounding inflammatory changes. Spleen: Normal in size without focal abnormality. Adrenals/Urinary Tract: Bilateral nonobstructing renal calculi are identified. Within the lower pole right kidney there are 2 discrete calyceal calculi largest measuring 3 mm. Increasing calcification in the perinephric tissues adjacent to the lower pole right kidney measuring 6 mm likely reflects sequela from prior percutaneous nephrostomy. Multiple nonobstructing left renal calculi are seen, largest in the lower pole calices measuring up to 15 mm. Again, there are linear calcifications extending through the posterior aspect of the interpolar left kidney likely representing sequela from previous percutaneous nephrostomy, stable. There is mild distension of the bilateral renal pelves and proximal ureters, likely due to extrinsic compression on the ureters by an enlarged leiomyomatous uterus. No distal urinary tract calculi. The bladder is minimally distended with no gross abnormalities. The adrenals are stable. Stomach/Bowel: No bowel obstruction or ileus. Large diverticulum off the 2/3 portion duodenum again noted, without complication. No bowel wall thickening or inflammatory change. Normal appendix right lower quadrant. Vascular/Lymphatic: Aortic atherosclerosis. No enlarged abdominal or pelvic lymph nodes. Reproductive: Enlarged leiomyomatous uterus is again identified, measuring 19.2 by 20.4 x 14.3 cm. There are no adnexal masses.  Other: There is a fat containing umbilical hernia. Significant fat stranding is seen within the herniated mesenteric fat, consistent with incarcerated hernia. No bowel herniation. There is no free fluid or free gas. Musculoskeletal: There are no acute or destructive bony lesions. Stable symmetrical sclerosis of the sacroiliac joints. Reconstructed images demonstrate no additional findings. IMPRESSION: 1. Incarcerated fat containing umbilical hernia. 2. Bilateral nonobstructing renal calculi, stable. Calcifications along the posterior margins of the bilateral kidneys consistent with previous percutaneous nephrostomies, stable. 3. Enlarged leiomyomatous uterus. 4. Mild distension of the bilateral renal pelves and proximal ureters, likely due to extrinsic compression on the ureters by an enlarged leiomyomatous uterus. 5. Aortic Atherosclerosis (ICD10-I70.0). Electronically Signed: By: Randa Ngo M.D. On: 05/23/2020 15:44   DG Abdomen Acute W/Chest  Result Date: 05/23/2020 CLINICAL DATA:  Abdominal pain with bloating EXAM: DG ABDOMEN ACUTE W/ 1V CHEST COMPARISON:  Abdominal radiograph May 21, 2020. CT abdomen and pelvis March 25, 2020 FINDINGS: PA chest: Lungs are clear. Heart size and pulmonary vascularity are normal. No adenopathy. Supine and upright abdomen: Pelvic mass better appreciated on CT and more recent abdominal radiographic examination. There is no appreciable bowel dilatation or air-fluid level to suggest bowel obstruction. No free air. There are surgical clips in the right upper quadrant. Scattered renal calculi again noted, largest seen on the left measuring approximately 1 cm. Surgical clips right upper quadrant. IMPRESSION: 1.  No bowel obstruction or free air. 2.  Multiple renal calculi bilaterally, larger on the left, stable. 3. Pelvic mass, less well seen currently than on previous study. Mass known to represent enlarged leiomyomatous uterus. 4.  Lungs clear. Electronically Signed   By: Lowella Grip III M.D.   On: 05/23/2020 14:09    EKG: Independently reviewed.  Normal sinus rhythm with frequent PVCs   [facility]  If 7PM-7AM, please contact night-coverage [email or web address]   05/23/2020, 5:02 PM

## 2020-05-23 NOTE — Telephone Encounter (Signed)
Normal Pap/HPV result letter mailed.

## 2020-05-24 ENCOUNTER — Observation Stay (HOSPITAL_COMMUNITY): Payer: Self-pay | Admitting: Anesthesiology

## 2020-05-24 ENCOUNTER — Encounter (HOSPITAL_COMMUNITY)
Admission: EM | Disposition: A | Discharge status: Home / Self Care | Payer: Self-pay | Source: Home / Self Care | Attending: Internal Medicine

## 2020-05-24 ENCOUNTER — Other Ambulatory Visit: Payer: Self-pay

## 2020-05-24 ENCOUNTER — Observation Stay (HOSPITAL_COMMUNITY): Payer: Self-pay

## 2020-05-24 DIAGNOSIS — I5022 Chronic systolic (congestive) heart failure: Secondary | ICD-10-CM

## 2020-05-24 DIAGNOSIS — D259 Leiomyoma of uterus, unspecified: Secondary | ICD-10-CM

## 2020-05-24 DIAGNOSIS — I482 Chronic atrial fibrillation, unspecified: Secondary | ICD-10-CM

## 2020-05-24 DIAGNOSIS — I493 Ventricular premature depolarization: Secondary | ICD-10-CM

## 2020-05-24 DIAGNOSIS — N134 Hydroureter: Secondary | ICD-10-CM

## 2020-05-24 DIAGNOSIS — I10 Essential (primary) hypertension: Secondary | ICD-10-CM

## 2020-05-24 DIAGNOSIS — N184 Chronic kidney disease, stage 4 (severe): Secondary | ICD-10-CM

## 2020-05-24 HISTORY — PX: CYSTOSCOPY W/ URETERAL STENT PLACEMENT: SHX1429

## 2020-05-24 LAB — BASIC METABOLIC PANEL
Anion gap: 9 (ref 5–15)
BUN: 45 mg/dL — ABNORMAL HIGH (ref 6–20)
CO2: 27 mmol/L (ref 22–32)
Calcium: 8.2 mg/dL — ABNORMAL LOW (ref 8.9–10.3)
Chloride: 101 mmol/L (ref 98–111)
Creatinine, Ser: 3.38 mg/dL — ABNORMAL HIGH (ref 0.44–1.00)
GFR calc Af Amer: 17 mL/min — ABNORMAL LOW (ref >60)
GFR calc non Af Amer: 15 mL/min — ABNORMAL LOW (ref >60)
Glucose, Bld: 104 mg/dL — ABNORMAL HIGH (ref 70–99)
Potassium: 3.1 mmol/L — ABNORMAL LOW (ref 3.5–5.1)
Sodium: 137 mmol/L (ref 135–145)

## 2020-05-24 LAB — GLUCOSE, CAPILLARY
Glucose-Capillary: 113 mg/dL — ABNORMAL HIGH (ref 70–99)
Glucose-Capillary: 177 mg/dL — ABNORMAL HIGH (ref 70–99)
Glucose-Capillary: 275 mg/dL — ABNORMAL HIGH (ref 70–99)
Glucose-Capillary: 281 mg/dL — ABNORMAL HIGH (ref 70–99)
Glucose-Capillary: 363 mg/dL — ABNORMAL HIGH (ref 70–99)

## 2020-05-24 LAB — CBC
HCT: 29.6 % — ABNORMAL LOW (ref 36.0–46.0)
Hemoglobin: 9.6 g/dL — ABNORMAL LOW (ref 12.0–15.0)
MCH: 29.5 pg (ref 26.0–34.0)
MCHC: 32.4 g/dL (ref 30.0–36.0)
MCV: 91.1 fL (ref 80.0–100.0)
Platelets: 157 10*3/uL (ref 150–400)
RBC: 3.25 MIL/uL — ABNORMAL LOW (ref 3.87–5.11)
RDW: 14 % (ref 11.5–15.5)
WBC: 5.2 10*3/uL (ref 4.0–10.5)
nRBC: 0 % (ref 0.0–0.2)

## 2020-05-24 SURGERY — CYSTOSCOPY, WITH RETROGRADE PYELOGRAM AND URETERAL STENT INSERTION
Anesthesia: General | Site: Ureter | Laterality: Bilateral

## 2020-05-24 MED ORDER — SODIUM CHLORIDE 0.9 % IV SOLN: 1.0000 g | Freq: Once | INTRAVENOUS | Status: DC

## 2020-05-24 MED ORDER — MIDAZOLAM HCL 5 MG/5ML IJ SOLN
INTRAMUSCULAR | Status: DC | PRN
  Administered 2020-05-24: 2 mg via INTRAVENOUS

## 2020-05-24 MED ORDER — IOHEXOL 300 MG/ML  SOLN
INTRAMUSCULAR | Status: DC | PRN
  Administered 2020-05-24: 28 mL

## 2020-05-24 MED ORDER — LIDOCAINE 2% (20 MG/ML) 5 ML SYRINGE
INTRAMUSCULAR | Status: DC | PRN
  Administered 2020-05-24: 50 mg via INTRAVENOUS

## 2020-05-24 MED ORDER — FENTANYL CITRATE (PF) 100 MCG/2ML IJ SOLN: 25.0000 ug | INTRAMUSCULAR | Status: DC | PRN

## 2020-05-24 MED ORDER — PROPOFOL 10 MG/ML IV BOLUS
INTRAVENOUS | Status: DC | PRN
  Administered 2020-05-24: 150 mg via INTRAVENOUS

## 2020-05-24 MED ORDER — SODIUM CHLORIDE 0.9 % IV SOLN
1.0000 g | Freq: Once | INTRAVENOUS | Status: AC
  Administered 2020-05-24: 1 g via INTRAVENOUS
  Filled 2020-05-24: qty 1

## 2020-05-24 MED ORDER — FENTANYL CITRATE (PF) 100 MCG/2ML IJ SOLN
INTRAMUSCULAR | Status: AC
  Filled 2020-05-24: qty 2

## 2020-05-24 MED ORDER — DEXAMETHASONE SODIUM PHOSPHATE 10 MG/ML IJ SOLN
INTRAMUSCULAR | Status: DC | PRN
  Administered 2020-05-24: 8 mg via INTRAVENOUS

## 2020-05-24 MED ORDER — ONDANSETRON HCL 4 MG/2ML IJ SOLN
INTRAMUSCULAR | Status: AC
  Filled 2020-05-24: qty 2

## 2020-05-24 MED ORDER — STERILE WATER FOR IRRIGATION IR SOLN
Status: DC | PRN
  Administered 2020-05-24: 500 mL

## 2020-05-24 MED ORDER — POLYETHYLENE GLYCOL 3350 17 G PO PACK
17.0000 g | PACK | Freq: Every day | ORAL | Status: DC
  Administered 2020-05-24 – 2020-05-26 (×3): 17 g via ORAL
  Filled 2020-05-24 (×3): qty 1

## 2020-05-24 MED ORDER — PHENYLEPHRINE 40 MCG/ML (10ML) SYRINGE FOR IV PUSH (FOR BLOOD PRESSURE SUPPORT)
PREFILLED_SYRINGE | INTRAVENOUS | Status: AC
  Filled 2020-05-24: qty 10

## 2020-05-24 MED ORDER — DEXAMETHASONE SODIUM PHOSPHATE 10 MG/ML IJ SOLN
INTRAMUSCULAR | Status: AC
  Filled 2020-05-24: qty 1

## 2020-05-24 MED ORDER — ONDANSETRON HCL 4 MG/2ML IJ SOLN: 4.0000 mg | Freq: Once | INTRAMUSCULAR | Status: DC | PRN

## 2020-05-24 MED ORDER — PROPOFOL 10 MG/ML IV BOLUS
INTRAVENOUS | Status: AC
  Filled 2020-05-24: qty 20

## 2020-05-24 MED ORDER — PHENYLEPHRINE 40 MCG/ML (10ML) SYRINGE FOR IV PUSH (FOR BLOOD PRESSURE SUPPORT)
PREFILLED_SYRINGE | INTRAVENOUS | Status: DC | PRN
  Administered 2020-05-24: 120 ug via INTRAVENOUS
  Administered 2020-05-24: 80 ug via INTRAVENOUS
  Administered 2020-05-24: 120 ug via INTRAVENOUS

## 2020-05-24 MED ORDER — LIDOCAINE 2% (20 MG/ML) 5 ML SYRINGE
INTRAMUSCULAR | Status: AC
  Filled 2020-05-24: qty 5

## 2020-05-24 MED ORDER — ONDANSETRON HCL 4 MG/2ML IJ SOLN
INTRAMUSCULAR | Status: DC | PRN
  Administered 2020-05-24: 4 mg via INTRAVENOUS

## 2020-05-24 MED ORDER — EPHEDRINE SULFATE-NACL 50-0.9 MG/10ML-% IV SOSY
PREFILLED_SYRINGE | INTRAVENOUS | Status: DC | PRN
  Administered 2020-05-24: 10 mg via INTRAVENOUS

## 2020-05-24 MED ORDER — MAGNESIUM SULFATE 2 GM/50ML IV SOLN
2.0000 g | Freq: Once | INTRAVENOUS | Status: AC
  Administered 2020-05-24: 2 g via INTRAVENOUS
  Filled 2020-05-24: qty 50

## 2020-05-24 MED ORDER — EPHEDRINE 5 MG/ML INJ
INTRAVENOUS | Status: AC
  Filled 2020-05-24: qty 10

## 2020-05-24 MED ORDER — MIDAZOLAM HCL 2 MG/2ML IJ SOLN
INTRAMUSCULAR | Status: AC
  Filled 2020-05-24: qty 2

## 2020-05-24 MED ORDER — LACTATED RINGERS IV SOLN
INTRAVENOUS | Status: DC | PRN
Start: 2020-05-24 — End: 2020-05-24

## 2020-05-24 MED ORDER — SODIUM CHLORIDE 0.9 % IR SOLN
Status: DC | PRN
  Administered 2020-05-24: 3000 mL

## 2020-05-24 MED ORDER — FENTANYL CITRATE (PF) 100 MCG/2ML IJ SOLN
INTRAMUSCULAR | Status: DC | PRN
  Administered 2020-05-24: 50 ug via INTRAVENOUS
  Administered 2020-05-24 (×2): 25 ug via INTRAVENOUS

## 2020-05-24 MED ORDER — POTASSIUM CHLORIDE 10 MEQ/100ML IV SOLN
10.0000 meq | INTRAVENOUS | Status: AC
  Administered 2020-05-24 (×4): 10 meq via INTRAVENOUS
  Filled 2020-05-24 (×2): qty 100

## 2020-05-24 SURGICAL SUPPLY — 13 items
BAG URO CATCHER STRL LF (MISCELLANEOUS) ×2 IMPLANT
CATH INTERMIT  6FR 70CM (CATHETERS) ×2 IMPLANT
CLOTH BEACON ORANGE TIMEOUT ST (SAFETY) ×2 IMPLANT
GLOVE BIO SURGEON STRL SZ 6.5 (GLOVE) ×2 IMPLANT
GOWN STRL REUS W/ TWL LRG LVL3 (GOWN DISPOSABLE) ×1 IMPLANT
GOWN STRL REUS W/TWL LRG LVL3 (GOWN DISPOSABLE) ×5 IMPLANT
GUIDEWIRE STR DUAL SENSOR (WIRE) ×2 IMPLANT
KIT TURNOVER KIT A (KITS) IMPLANT
MANIFOLD NEPTUNE II (INSTRUMENTS) ×2 IMPLANT
STENT URET 6FRX26 CONTOUR (STENTS) ×2 IMPLANT
TRAY CYSTO PACK (CUSTOM PROCEDURE TRAY) ×2 IMPLANT
TUBING CONNECTING 10 (TUBING) ×2 IMPLANT
TUBING UROLOGY SET (TUBING) IMPLANT

## 2020-05-24 NOTE — Plan of Care (Signed)

## 2020-05-24 NOTE — Plan of Care (Signed)
  Problem: Education: Goal: Knowledge of General Education information will improve Description: Including pain rating scale, medication(s)/side effects and non-pharmacologic comfort measures Outcome: Progressing   Problem: Health Behavior/Discharge Planning: Goal: Ability to manage health-related needs will improve Outcome: Progressing   Problem: Clinical Measurements: Goal: Will remain free from infection Outcome: Progressing Goal: Respiratory complications will improve Outcome: Progressing   Problem: Activity: Goal: Risk for activity intolerance will decrease Outcome: Progressing   Problem: Nutrition: Goal: Adequate nutrition will be maintained Outcome: Progressing   

## 2020-05-24 NOTE — Progress Notes (Signed)
PROGRESS NOTE  Adrienne Lane BPZ:[id number] DOB: [birth year] DOA: 05/23/2020 PCP: Nicolette Bang, DO   LOS: 0 days   Brief narrative: As per HPI,  Adrienne Lane is a 54 y.o. female with medical history significant of A. fib, diastolic heart failure EF 50 to 55%, CKD 4, fibroid uterus, history of nephrolithiasis, iron deficiency anemia, hypertension, obesity who presented to the hospital with complaints of 1 week of abdominal pain and changes in bowel and bladder frequency with decreased urinary output epigastric periumbilical pain not improved with over-the-counter medication heat or ice.  In the ED patient had a CT scan of the abdomen which showed bilateral hydronephrosis and large fibroid uterus.  Creatinine on presentation was 3.24 with mild hypokalemia at 3.4. Given elevated creatinine, intractable abdominal pain poor p.o. intake and changes in bowel and bladder habits hospitalist was called for further evaluation and admission.  Assessment/Plan:  Principal Problem:   AKI (acute kidney injury) ([place]) Active Problems:   CHF (congestive heart failure) (HCC)   Essential hypertension   Uterine fibroid   Frequent PVCs   Chronic kidney disease (CKD), stage IV (severe) (HCC)   Chronic a-fib (HCC)   Hydroureter on right  AKI on chronic renal disease stage IV likely secondary to hydronephrosis.   Received some IV fluids.  Creatinine moderately elevated, baseline around 1.9-2.0. Urology was consulted for possible stent placement.  Patient did have a history of bilateral staghorn calculus removal with residual hydroureter.  Creatinine of of 3.3 today.  Patient was then taken for cystoscopy with with bilateral ureteral stent placement with bilateral retrograde pyelography.  Afebrile at this time.  Has mild lower abdominal pain office intervention.  Hypokalemia.  Will replenish.  Potassium of 3.1.  Replenish IV.  Check magnesium level in a.m.  We will give her magnesium sulfate 2 g x  1.  Large fibroid uterus, likely contributing to hydronephrosis.  GYN  Dr. Roselie Awkward had seen the patient previously who had recommended surgical removal of fibroid but because of A. fib and heart failure diagnosis it had not been performed.  Could consider elective removal as scheduled in July.  Constipation, secondary to above  Dulcolax Senokot or MiraLAX as needed.  Chronic heart failure, diastolic, EF 50 to 24%, Compensated at this time.  Continue carvedilol, currently holding ARB and diuretic in the setting of AKI as above. Monitor closely on IV fluids.  A. fib, rate controlled, frequent PVCs.  Rate controlled on Carvedilol.  Not on anticoagulation.  Had an episode of bradycardia but at this time has improved.  Need to replenish electrolytes.  Monitor.  Will check magnesium level as well  History of nephrolithiasis status post bilateral staghorn calculi removal earlier this year.  Urology on board.  Anemia, iron deficiency, at baseline. Continue home iron supplementation  Essential hypertension Continue amlodipine, carvedilol, hydralazine, isosorbide -holding diuretics due to AKI  Diabetes mellittus type II, poorly controlled Continue sliding scale insulin for now.  Hold p.o. medication.   VTE Prophylaxis: Heparin subcu  Code Status: Full code  Family Communication: Spoke with the patient's husband at bedside  Status is: Observation  The patient will require care spanning > 2 midnights and should be moved to inpatient because: Inpatient level of care appropriate due to severity of illness and Acute kidney injury on CKD, urological procedure and further monitoring plan  Dispo: The patient is from: Home              Anticipated d/c is to: Home  Anticipated d/c date is: 2 days              Patient currently is not medically stable to d/c.    Consultants:  Urology  Procedures:  Cystoscopy with bilateral ureteric stent  insertion.  Antibiotics:  . None  Anti-infectives (From admission, onward)   None     Subjective: Today, patient was seen and examined at bedside.  Seen after ureteric stent placement.  Complaints of mild abdominal pain.  Also complains of constipation.  Objective: Vitals:   05/24/20 0121 05/24/20 0547  BP: 109/64 106/64  Pulse: 62 (!) 53  Resp: 16 15  Temp: 98.5 F (36.9 C) (!) 97.5 F (36.4 C)  SpO2: 97% 96%    Intake/Output Summary (Last 24 hours) at 05/24/2020 0755 Last data filed at 05/24/2020 0600 Gross per 24 hour  Intake 1267.25 ml  Output 0 ml  Net 1267.25 ml   Filed Weights   05/23/20 1002  Weight: 102.1 kg   Body mass index is 34.21 kg/m.   Physical Exam:  GENERAL: Patient is alert awake and oriented. Not in obvious distress. Obese HENT: No scleral pallor or icterus. Pupils equally reactive to light. Oral mucosa is moist NECK: is supple, no gross swelling noted. CHEST: Clear to auscultation. No crackles or wheezes.  Diminished breath sounds bilaterally. CVS: S1 and S2 heard, no murmur. Regular rate and rhythm.  ABDOMEN: Soft, tenderness over the epigastric region, bowel sounds are present.  Suprapubic tenderness noted. EXTREMITIES: No edema. CNS: Cranial nerves are intact. No focal motor deficits. SKIN: warm and dry without rashes.  Data Review: I have personally reviewed the following laboratory data and studies,  CBC: Recent Labs  Lab 05/23/20 1007 05/24/20 0336  WBC 9.7 5.2  HGB 12.4 9.6*  HCT 37.9 29.6*  MCV 90.5 91.1  PLT 189 735   Basic Metabolic Panel: Recent Labs  Lab 05/23/20 0917 05/23/20 1007 05/24/20 0336  NA 140 141 137  K 3.3* 3.4* 3.1*  CL 100 100 101  CO2 29 29 27   GLUCOSE 194* 123* 104*  BUN 36* 41* 45*  CREATININE 3.24* 3.23* 3.38*  CALCIUM 9.5 9.4 8.2*   Liver Function Tests: Recent Labs  Lab 05/23/20 1007  AST 20  ALT 15  ALKPHOS 75  BILITOT 0.8  PROT 7.8  ALBUMIN 3.6   Recent Labs  Lab  05/23/20 1007  LIPASE 21   No results for input(s): AMMONIA in the last 168 hours. Cardiac Enzymes: No results for input(s): CKTOTAL, CKMB, CKMBINDEX, TROPONINI in the last 168 hours. BNP (last 3 results) Recent Labs    10/20/19 2029  BNP 1,623.1*    ProBNP (last 3 results) No results for input(s): PROBNP in the last 8760 hours.  CBG: Recent Labs  Lab 05/23/20 1753 05/23/20 2227 05/24/20 0743  GLUCAP 122* 237* 113*   Recent Results (from the past 240 hour(s))  SARS Coronavirus 2 by RT PCR (hospital order, performed in [facility] hospital lab) Nasopharyngeal Nasopharyngeal Swab     Status: None   Collection Time: 05/23/20  3:05 PM   Specimen: Nasopharyngeal Swab  Result Value Ref Range Status   SARS Coronavirus 2 NEGATIVE NEGATIVE Final    Comment: (NOTE) SARS-CoV-2 target nucleic acids are NOT DETECTED. The SARS-CoV-2 RNA is generally detectable in upper and lower respiratory specimens during the acute phase of infection. The lowest concentration of SARS-CoV-2 viral copies this assay can detect is 250 copies / mL. A negative result does not preclude SARS-CoV-2  infection and should not be used as the sole basis for treatment or other patient management decisions.  A negative result may occur with improper specimen collection / handling, submission of specimen other than nasopharyngeal swab, presence of viral mutation(s) within the areas targeted by this assay, and inadequate number of viral copies (<250 copies / mL). A negative result must be combined with clinical observations, patient history, and epidemiological information. Fact Sheet for Patients:   StrictlyIdeas.no Fact Sheet for Healthcare Providers: BankingDealers.co.za This test is not yet approved or cleared  by the [place] FDA and has been authorized for detection and/or diagnosis of SARS-CoV-2 by FDA under an Emergency Use Authorization (EUA).  This EUA  will remain in effect (meaning this test can be used) for the duration of the COVID-19 declaration under Section 564(b)(1) of the Act, 21 U.S.C. section 360bbb-3(b)(1), unless the authorization is terminated or revoked sooner. Performed at [facility], [place] [address], [place], [place] [address]      Studies: CT ABDOMEN PELVIS WO CONTRAST  Addendum Date: 05/23/2020   ADDENDUM REPORT: 05/23/2020 15:58 ADDENDUM: The case was reviewed with Dr. Alvino Chapel. On physical exam the patient is tender within the left mid abdomen. There is a short segment of jejunum on images 36 through 46 demonstrating wall thickening. There is some mild mesenteric fat stranding in this region as well. Differential would include short segment inflammatory/infectious enteritis versus decompressed bowel due to peristalsis. Electronically Signed   By: Randa Ngo M.D.   On: 05/23/2020 15:58   Result Date: 05/23/2020 CLINICAL DATA:  Nausea, abdominal pain EXAM: CT ABDOMEN AND PELVIS WITHOUT CONTRAST TECHNIQUE: Multidetector CT imaging of the abdomen and pelvis was performed following the standard protocol without IV contrast. COMPARISON:  05/23/2020, 03/25/2020 FINDINGS: Lower chest: No acute pleural or parenchymal lung disease. Hepatobiliary: No focal liver abnormality is seen. Status post cholecystectomy. No biliary dilatation. Pancreas: Unremarkable. No pancreatic ductal dilatation or surrounding inflammatory changes. Spleen: Normal in size without focal abnormality. Adrenals/Urinary Tract: Bilateral nonobstructing renal calculi are identified. Within the lower pole right kidney there are 2 discrete calyceal calculi largest measuring 3 mm. Increasing calcification in the perinephric tissues adjacent to the lower pole right kidney measuring 6 mm likely reflects sequela from prior percutaneous nephrostomy. Multiple nonobstructing left renal calculi are seen, largest in the lower pole calices measuring up to 15  mm. Again, there are linear calcifications extending through the posterior aspect of the interpolar left kidney likely representing sequela from previous percutaneous nephrostomy, stable. There is mild distension of the bilateral renal pelves and proximal ureters, likely due to extrinsic compression on the ureters by an enlarged leiomyomatous uterus. No distal urinary tract calculi. The bladder is minimally distended with no gross abnormalities. The adrenals are stable. Stomach/Bowel: No bowel obstruction or ileus. Large diverticulum off the 2/3 portion duodenum again noted, without complication. No bowel wall thickening or inflammatory change. Normal appendix right lower quadrant. Vascular/Lymphatic: Aortic atherosclerosis. No enlarged abdominal or pelvic lymph nodes. Reproductive: Enlarged leiomyomatous uterus is again identified, measuring 19.2 by 20.4 x 14.3 cm. There are no adnexal masses. Other: There is a fat containing umbilical hernia. Significant fat stranding is seen within the herniated mesenteric fat, consistent with incarcerated hernia. No bowel herniation. There is no free fluid or free gas. Musculoskeletal: There are no acute or destructive bony lesions. Stable symmetrical sclerosis of the sacroiliac joints. Reconstructed images demonstrate no additional findings. IMPRESSION: 1. Incarcerated fat containing umbilical hernia. 2. Bilateral nonobstructing renal calculi,  stable. Calcifications along the posterior margins of the bilateral kidneys consistent with previous percutaneous nephrostomies, stable. 3. Enlarged leiomyomatous uterus. 4. Mild distension of the bilateral renal pelves and proximal ureters, likely due to extrinsic compression on the ureters by an enlarged leiomyomatous uterus. 5. Aortic Atherosclerosis (ICD10-I70.0). Electronically Signed: By: Randa Ngo M.D. On: 05/23/2020 15:44   DG Abdomen Acute W/Chest  Result Date: 05/23/2020 CLINICAL DATA:  Abdominal pain with bloating EXAM:  DG ABDOMEN ACUTE W/ 1V CHEST COMPARISON:  Abdominal radiograph May 21, 2020. CT abdomen and pelvis March 25, 2020 FINDINGS: PA chest: Lungs are clear. Heart size and pulmonary vascularity are normal. No adenopathy. Supine and upright abdomen: Pelvic mass better appreciated on CT and more recent abdominal radiographic examination. There is no appreciable bowel dilatation or air-fluid level to suggest bowel obstruction. No free air. There are surgical clips in the right upper quadrant. Scattered renal calculi again noted, largest seen on the left measuring approximately 1 cm. Surgical clips right upper quadrant. IMPRESSION: 1.  No bowel obstruction or free air. 2.  Multiple renal calculi bilaterally, larger on the left, stable. 3. Pelvic mass, less well seen currently than on previous study. Mass known to represent enlarged leiomyomatous uterus. 4.  Lungs clear. Electronically Signed   By: Lowella Grip III M.D.   On: 05/23/2020 14:09      Flora Lipps, MD  [facility] 05/24/2020

## 2020-05-24 NOTE — Transfer of Care (Signed)
Immediate Anesthesia Transfer of Care Note  Patient: Trenise Turay  Procedure(s) Performed: CYSTOSCOPY WITH RETROGRADE PYELOGRAM/URETERAL STENT PLACEMENT (Bilateral Ureter)  Patient Location: PACU  Anesthesia Type:General  Level of Consciousness: awake, drowsy, patient cooperative and responds to stimulation  Airway & Oxygen Therapy: Patient Spontanous Breathing and Patient connected to face mask oxygen  Post-op Assessment: Report given to RN and Post -op Vital signs reviewed and stable  Post vital signs: Reviewed and stable  Last Vitals:  Vitals Value Taken Time  BP    Temp    Pulse 74 05/24/20 0922  Resp    SpO2 100 % 05/24/20 0922  Vitals shown include unvalidated device data.  Last Pain:  Vitals:   05/24/20 0730  TempSrc:   PainSc: 2       Patients Stated Pain Goal: 3 (73/41/93 7902)  Complications: No apparent anesthesia complications

## 2020-05-24 NOTE — Anesthesia Postprocedure Evaluation (Signed)
Anesthesia Post Note  Patient: Adrienne Lane  Procedure(s) Performed: CYSTOSCOPY WITH RETROGRADE PYELOGRAM/URETERAL STENT PLACEMENT (Bilateral Ureter)     Patient location during evaluation: PACU Anesthesia Type: General Level of consciousness: awake and alert Pain management: pain level controlled Vital Signs Assessment: post-procedure vital signs reviewed and stable Respiratory status: spontaneous breathing, nonlabored ventilation, respiratory function stable and patient connected to nasal cannula oxygen Cardiovascular status: blood pressure returned to baseline and stable Postop Assessment: no apparent nausea or vomiting Anesthetic complications: no    Last Vitals:  Vitals:   05/24/20 1005 05/24/20 1037  BP: (!) 108/59 118/61  Pulse: (!) 47 64  Resp:  17  Temp:  (!) 36.4 C  SpO2: 97% 100%    Last Pain:  Vitals:   05/24/20 1037  TempSrc: Oral  PainSc:                  Tiajuana Amass

## 2020-05-24 NOTE — Op Note (Signed)
Preoperative diagnosis:  1. Bilateral hydronephrosis  Postoperative diagnosis:  1. Bilateral hydronephrosis  Procedure:  1. Cystoscopy 2. Bilateral ureteral stent placement 3. Bilateral retrograde pyelography with interpretation   Surgeon: Jacalyn Lefevre, MD  Anesthesia: General  Complications: None  Intraoperative findings:  bilateral retrograde pyelography demonstrated laterally deviated distal ureters with hydronephrosis (R>L)  EBL: Minimal  Specimens: None  Indication: Adrienne Lane is a 53 y.o. patient with an enlarged leiomyomatous uterus causing lateral extrinsic compression of bilateral ureters resulting in hydronephrosis. After reviewing the management options for treatment, he elected to proceed with the above surgical procedure(s). We have discussed the potential benefits and risks of the procedure, side effects of the proposed treatment, the likelihood of the patient achieving the goals of the procedure, and any potential problems that might occur during the procedure or recuperation. Informed consent has been obtained.  Description of procedure:  The patient was taken to the operating room and general anesthesia was induced.  The patient was placed in the dorsal lithotomy position, prepped and draped in the usual sterile fashion, and preoperative antibiotics were administered. A preoperative time-out was performed.   Cystourethroscopy was performed.  The patient's urethra was examined and was normal.The bladder was then systematically examined in its entirety. There was no evidence for any bladder tumors, stones, or other mucosal pathology.    Attention then turned to the leftureteral orifice and a ureteral catheter was used to intubate the ureteral orifice.  Omnipaque contrast was injected through the ureteral catheter and a retrograde pyelogram was performed with findings as dictated above.  A 0.38 sensor guidewire was then advanced up the left ureter into the renal  pelvis under fluoroscopic guidance.  The stent was then advanced over the wire and positioned appropriately under fluoroscopic and cystoscopic guidance.  The wire was then removed with an adequate stent curl noted in the renal pelvis as well as in the bladder.  This entire procedure was repeated on the right side.  The bladder was then emptied and the procedure ended.  The patient appeared to tolerate the procedure well and without complications.  The patient was able to be awakened and transferred to the recovery unit in satisfactory condition.   Follow up: Trend renal function.  Patient may need hysterectomy pending gyn evaluation.   Jacalyn Lefevre, M.D.

## 2020-05-24 NOTE — Interval H&P Note (Signed)
History and Physical Interval Note: Creatinine trending up. Will proceed with bilateral stent placement this AM.   05/24/2020 7:26 AM  Adrienne Lane  has presented today for surgery, with the diagnosis of BILATERAL HYDRONEPPHROSIS.  The various methods of treatment have been discussed with the patient and family. After consideration of risks, benefits and other options for treatment, the patient has consented to  Procedure(s): CYSTOSCOPY WITH RETROGRADE PYELOGRAM/URETERAL STENT PLACEMENT (Bilateral) as a surgical intervention.  The patient's history has been reviewed, patient examined, no change in status, stable for surgery.  I have reviewed the patient's chart and labs.  Questions were answered to the patient's satisfaction.     Catheleen Langhorne D Suhas Estis

## 2020-05-24 NOTE — Anesthesia Procedure Notes (Signed)
Procedure Name: LMA Insertion Date/Time: 05/24/2020 9:02 AM Performed by: Silas Sacramento, CRNA Pre-anesthesia Checklist: Patient identified, Emergency Drugs available, Suction available and Patient being monitored Patient Re-evaluated:Patient Re-evaluated prior to induction Oxygen Delivery Method: Circle system utilized Preoxygenation: Pre-oxygenation with 100% oxygen Induction Type: IV induction LMA: LMA with gastric port inserted LMA Size: 4.0 Tube type: Oral Number of attempts: 1 Placement Confirmation: ETT inserted through vocal cords under direct vision,  positive ETCO2 and breath sounds checked- equal and bilateral Tube secured with: Tape Dental Injury: Teeth and Oropharynx as per pre-operative assessment

## 2020-05-24 NOTE — Progress Notes (Signed)
Patient arrived to unit in hospital bed, A&Ox4, had been given dilaudid at 2304 and vitals prior to transfer to unit scored a yellow mews for low systolic and low hr (pt had also received multiple BP meds just prior to pain medication), vitals upon arrival to floor were improved, HR was 58 radial and systolic was >297, mews is green at this time, patient very comfortable, instructed on call bell use, admission completed, assessment done, will continue to monitor.

## 2020-05-24 NOTE — Anesthesia Preprocedure Evaluation (Signed)
Anesthesia Evaluation  Patient identified by MRN, date of birth, ID band Patient awake    Reviewed: Allergy & Precautions, NPO status , Patient's Chart, lab work & pertinent test results  Airway Mallampati: II  TM Distance: >3 FB Neck ROM: Full    Dental  (+) Dental Advisory Given   Pulmonary neg pulmonary ROS,    breath sounds clear to auscultation       Cardiovascular hypertension, Pt. on medications and Pt. on home beta blockers +CHF and + DOE  + dysrhythmias  Rhythm:Regular Rate:Normal     Neuro/Psych negative neurological ROS     GI/Hepatic negative GI ROS, Neg liver ROS,   Endo/Other  diabetes, Type 2, Insulin Dependent  Renal/GU CRFRenal disease     Musculoskeletal   Abdominal   Peds  Hematology  (+) anemia ,   Anesthesia Other Findings   Reproductive/Obstetrics                             Lab Results  Component Value Date   WBC 5.2 05/24/2020   HGB 9.6 (L) 05/24/2020   HCT 29.6 (L) 05/24/2020   MCV 91.1 05/24/2020   PLT 157 05/24/2020   Lab Results  Component Value Date   CREATININE 3.38 (H) 05/24/2020   BUN 45 (H) 05/24/2020   NA 137 05/24/2020   K 3.1 (L) 05/24/2020   CL 101 05/24/2020   CO2 27 05/24/2020    Anesthesia Physical Anesthesia Plan  ASA: III  Anesthesia Plan: General   Post-op Pain Management:    Induction: Intravenous  PONV Risk Score and Plan: 3 and Dexamethasone, Ondansetron and Treatment may vary due to age or medical condition  Airway Management Planned: LMA  Additional Equipment: None  Intra-op Plan:   Post-operative Plan: Extubation in OR  Informed Consent: I have reviewed the patients History and Physical, chart, labs and discussed the procedure including the risks, benefits and alternatives for the proposed anesthesia with the patient or authorized representative who has indicated his/her understanding and acceptance.      Dental advisory given  Plan Discussed with: CRNA  Anesthesia Plan Comments:         Anesthesia Quick Evaluation

## 2020-05-25 LAB — COMPREHENSIVE METABOLIC PANEL
ALT: 14 U/L (ref 0–44)
AST: 19 U/L (ref 15–41)
Albumin: 3 g/dL — ABNORMAL LOW (ref 3.5–5.0)
Alkaline Phosphatase: 57 U/L (ref 38–126)
Anion gap: 12 (ref 5–15)
BUN: 53 mg/dL — ABNORMAL HIGH (ref 6–20)
CO2: 23 mmol/L (ref 22–32)
Calcium: 8.1 mg/dL — ABNORMAL LOW (ref 8.9–10.3)
Chloride: 98 mmol/L (ref 98–111)
Creatinine, Ser: 2.93 mg/dL — ABNORMAL HIGH (ref 0.44–1.00)
GFR calc Af Amer: 20 mL/min — ABNORMAL LOW (ref >60)
GFR calc non Af Amer: 18 mL/min — ABNORMAL LOW (ref >60)
Glucose, Bld: 256 mg/dL — ABNORMAL HIGH (ref 70–99)
Potassium: 3.6 mmol/L (ref 3.5–5.1)
Sodium: 133 mmol/L — ABNORMAL LOW (ref 135–145)
Total Bilirubin: 0.5 mg/dL (ref 0.3–1.2)
Total Protein: 6.5 g/dL (ref 6.5–8.1)

## 2020-05-25 LAB — CBC
HCT: 32.4 % — ABNORMAL LOW (ref 36.0–46.0)
Hemoglobin: 10.8 g/dL — ABNORMAL LOW (ref 12.0–15.0)
MCH: 29.6 pg (ref 26.0–34.0)
MCHC: 33.3 g/dL (ref 30.0–36.0)
MCV: 88.8 fL (ref 80.0–100.0)
Platelets: 167 10*3/uL (ref 150–400)
RBC: 3.65 MIL/uL — ABNORMAL LOW (ref 3.87–5.11)
RDW: 13.6 % (ref 11.5–15.5)
WBC: 7.5 10*3/uL (ref 4.0–10.5)
nRBC: 0 % (ref 0.0–0.2)

## 2020-05-25 LAB — PHOSPHORUS: Phosphorus: 3.9 mg/dL (ref 2.5–4.6)

## 2020-05-25 LAB — GLUCOSE, CAPILLARY
Glucose-Capillary: 213 mg/dL — ABNORMAL HIGH (ref 70–99)
Glucose-Capillary: 180 mg/dL — ABNORMAL HIGH (ref 70–99)
Glucose-Capillary: 170 mg/dL — ABNORMAL HIGH (ref 70–99)
Glucose-Capillary: 236 mg/dL — ABNORMAL HIGH (ref 70–99)

## 2020-05-25 LAB — MAGNESIUM: Magnesium: 3 mg/dL — ABNORMAL HIGH (ref 1.7–2.4)

## 2020-05-25 MED ORDER — POTASSIUM CHLORIDE CRYS ER 20 MEQ PO TBCR
40.0000 meq | EXTENDED_RELEASE_TABLET | Freq: Two times a day (BID) | ORAL | Status: DC
  Administered 2020-05-25 – 2020-05-26 (×3): 40 meq via ORAL
  Filled 2020-05-25 (×3): qty 2

## 2020-05-25 NOTE — Progress Notes (Signed)
   05/24/20 2142  Assess: MEWS Score  Temp 98.1 F (36.7 C)  BP 138/71  Pulse Rate (!) 138  Resp 18  Level of Consciousness Alert  Assess: MEWS Score  MEWS Temp 0  MEWS Systolic 0  MEWS Pulse 3  MEWS RR 0  MEWS LOC 0  MEWS Score 3  MEWS Score Color Yellow  Assess: if the MEWS score is Yellow or Red  Were vital signs taken at a resting state? Yes  Focused Assessment Documented focused assessment  Early Detection of Sepsis Score *See Row Information* Low  MEWS guidelines implemented *See Row Information* Yes  Treat  MEWS Interventions Administered scheduled meds/treatments;Escalated (See documentation below)  Take Vital Signs  Increase Vital Sign Frequency  Yellow: Q 2hr X 2 then Q 4hr X 2, if remains yellow, continue Q 4hrs  Escalate  MEWS: Escalate Yellow: discuss with charge nurse/RN and consider discussing with provider and RRT  Notify: Provider  Provider Name/Title Dr. Myna Hidalgo  Date Provider Notified 05/24/20  Time Provider Notified 2205  Notification Type Page  Notification Reason Change in status  Response See new orders  Date of Provider Response 05/24/20  Time of Provider Response 2215  Document  Patient Outcome Transferred/level of care increased  Progress note created (see row info) Yes   Interventions: EKG obtained Patient transferred to telemetry  [place] Jakerra Floyd RN

## 2020-05-25 NOTE — Progress Notes (Signed)
PROGRESS NOTE  Adrienne Lane TDV:[id number] DOB: 1966-10-29 DOA: 05/23/2020 PCP: Nicolette Bang, DO   LOS: 1 day   Brief narrative: As per HPI,  Adrienne Lane is a 54 y.o. female with medical history significant of A. fib, diastolic heart failure EF 50 to 55%, CKD 4, fibroid uterus, history of nephrolithiasis, iron deficiency anemia, hypertension, obesity who presented to the hospital with complaints of 1 week of abdominal pain and changes in bowel and bladder frequency with decreased urinary output epigastric periumbilical pain not improved with over-the-counter medication heat or ice.  In the ED, patient had a CT scan of the abdomen which showed bilateral hydronephrosis and large fibroid uterus.  Creatinine on presentation was 3.24 with mild hypokalemia at 3.4. Given elevated creatinine, intractable abdominal pain poor p.o. intake and changes in bowel and bladder habits hospitalist was called for further evaluation and admission.  Assessment/Plan:  Principal Problem:   AKI (acute kidney injury) ([place]) Active Problems:   CHF (congestive heart failure) (HCC)   Essential hypertension   Uterine fibroid   Frequent PVCs   Chronic kidney disease (CKD), stage IV (severe) (HCC)   Chronic a-fib (HCC)   Hydroureter on right  AKI on chronic renal disease stage IV likely secondary to hydronephrosis.    Urology was consulted for possible stent placement.  Patient did have a history of bilateral staghorn calculus removal with residual hydroureter.  Creatinine of 2.9 from 3.3 yesterday..  Patient was then taken for cystoscopy with with bilateral ureteral stent placement with bilateral retrograde pyelography.  Afebrile and without leukocytosis.  Hypokalemia.  Received magnesium sulfate and potassium yesterday.  Potassium 3.6 today. Will continue to replenish potassium orally.  Check BMP in a.m.  Large fibroid uterus, likely contributing to hydronephrosis.  GYN  Dr. Roselie Awkward had seen the  patient previously who had recommended surgical removal of fibroid but because of A. fib and heart failure diagnosis it had not been performed.  Plan for elective removal as scheduled in July.  Constipation, secondary to above  Dulcolax Senokot or MiraLAX as needed.  Continue bowel regimen  Chronic heart failure, diastolic, EF 50 to 06%, Compensated at this time.  Continue carvedilol, currently holding ARB and diuretic in the setting of AKI as above.  Discontinue IV fluids.  A. fib, rate controlled, frequent PVCs.  With episodes of bradycardia frequent PVCs bigeminy.  Spoke with Dr. Marlou Porch cardiology.  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 Lovena Le and Bensimhon in the past.  He stated to continue the current medication which include mexiletine and Coreg.  Patient is asymptomatic from it.  Continue to replenish potassium.  Check magnesium levels in a.m.    History of nephrolithiasis status post bilateral staghorn calculi removal earlier this year.  Urology on board.  Anemia, iron deficiency, at baseline. Continue home iron supplementation  Essential hypertension Continue amlodipine, carvedilol, hydralazine, isosorbide -holding diuretics due to AKI  Diabetes mellittus type II, poorly controlled Continue sliding scale insulin, Lantus from home.  Diabetic diet.  Closely monitor blood glucose levels.  Latest POC glucose of 213   VTE Prophylaxis: Heparin subcu  Code Status: Full code  Family Communication: None today.  Spoke with the patient's husband yesterday.  Status is: Inpatient  The patient will need inpatient because: Inpatient level of care appropriate due to severity of illness and Acute kidney injury on CKD, urological procedure and further monitoring plan  Dispo: The patient is from: Home  Anticipated d/c is to: Home              Anticipated d/c date is: 1-2 days, urology recommendation.              Patient currently is not medically stable  to d/c.    Consultants:  Urology  Procedures:  Cystoscopy with bilateral ureteric stent insertion.  Antibiotics:  . None  Anti-infectives (From admission, onward)   Start     Dose/Rate Route Frequency Ordered Stop   05/24/20 0845  cefTRIAXone (ROCEPHIN) 1 g in sodium chloride 0.9 % 100 mL IVPB  Status:  Discontinued     1 g 200 mL/hr over 30 Minutes Intravenous  Once 05/24/20 0836 05/24/20 0837   05/24/20 0845  cefTRIAXone (ROCEPHIN) 1 g in sodium chloride 0.9 % 100 mL IVPB     1 g 200 mL/hr over 30 Minutes Intravenous  Once 05/24/20 0838 05/24/20 0906     Subjective: Today, patient denies urinary urgency frequency or dysuria but has mild pink urine.  Complains of mild abdominal discomfort.  No fever chills or rigor.  Denies shortness of breath cough or chest pain.    Objective: Vitals:   05/25/20 0504 05/25/20 0546  BP: (!) 151/80 (!) 144/81  Pulse: 61 97  Resp:  16  Temp: 97.9 F (36.6 C) 98.3 F (36.8 C)  SpO2: 98% 99%    Intake/Output Summary (Last 24 hours) at 05/25/2020 0725 Last data filed at 05/25/2020 0200 Gross per 24 hour  Intake 2596.32 ml  Output 705 ml  Net 1891.32 ml   Filed Weights   05/23/20 1002  Weight: 102.1 kg   Body mass index is 34.21 kg/m.   Physical Exam:  GENERAL: Patient is alert awake and oriented. Not in obvious distress. Obese HENT: No scleral pallor or icterus. Pupils equally reactive to light. Oral mucosa is moist NECK: is supple, no gross swelling noted. CHEST: Clear to auscultation. No crackles or wheezes.  Diminished breath sounds bilaterally. CVS: S1 and S2 heard, no murmur. Regular rate and rhythm.  ABDOMEN: Soft, tenderness over the epigastric region, bowel sounds are present.  Suprapubic tenderness noted. EXTREMITIES: No edema. CNS: Cranial nerves are intact. No focal motor deficits. SKIN: warm and dry without rashes.  Data Review: I have personally reviewed the following laboratory data and  studies,  CBC: Recent Labs  Lab 05/23/20 1007 05/24/20 0336 05/25/20 0437  WBC 9.7 5.2 7.5  HGB 12.4 9.6* 10.8*  HCT 37.9 29.6* 32.4*  MCV 90.5 91.1 88.8  PLT 189 157 268   Basic Metabolic Panel: Recent Labs  Lab 05/23/20 0917 05/23/20 1007 05/24/20 0336 05/25/20 0437  NA 140 141 137 133*  K 3.3* 3.4* 3.1* 3.6  CL 100 100 101 98  CO2 29 29 27 23   GLUCOSE 194* 123* 104* 256*  BUN 36* 41* 45* 53*  CREATININE 3.24* 3.23* 3.38* 2.93*  CALCIUM 9.5 9.4 8.2* 8.1*  MG  --   --   --  3.0*  PHOS  --   --   --  3.9   Liver Function Tests: Recent Labs  Lab 05/23/20 1007 05/25/20 0437  AST 20 19  ALT 15 14  ALKPHOS 75 57  BILITOT 0.8 0.5  PROT 7.8 6.5  ALBUMIN 3.6 3.0*   Recent Labs  Lab 05/23/20 1007  LIPASE 21   No results for input(s): AMMONIA in the last 168 hours. Cardiac Enzymes: No results for input(s): CKTOTAL, CKMB, CKMBINDEX, TROPONINI in the last  168 hours. BNP (last 3 results) Recent Labs    10/20/19 2029  BNP 1,623.1*    ProBNP (last 3 results) No results for input(s): PROBNP in the last 8760 hours.  CBG: Recent Labs  Lab 05/24/20 0743 05/24/20 1220 05/24/20 1732 05/24/20 2237 05/24/20 2333  GLUCAP 113* 177* 363* 275* 281*   Recent Results (from the past 240 hour(s))  SARS Coronavirus 2 by RT PCR (hospital order, performed in [facility] hospital lab) Nasopharyngeal Nasopharyngeal Swab     Status: None   Collection Time: 05/23/20  3:05 PM   Specimen: Nasopharyngeal Swab  Result Value Ref Range Status   SARS Coronavirus 2 NEGATIVE NEGATIVE Final    Comment: (NOTE) SARS-CoV-2 target nucleic acids are NOT DETECTED. The SARS-CoV-2 RNA is generally detectable in upper and lower respiratory specimens during the acute phase of infection. The lowest concentration of SARS-CoV-2 viral copies this assay can detect is 250 copies / mL. A negative result does not preclude SARS-CoV-2 infection and should not be used as the sole basis for treatment  or other patient management decisions.  A negative result may occur with improper specimen collection / handling, submission of specimen other than nasopharyngeal swab, presence of viral mutation(s) within the areas targeted by this assay, and inadequate number of viral copies (<250 copies / mL). A negative result must be combined with clinical observations, patient history, and epidemiological information. Fact Sheet for Patients:   StrictlyIdeas.no Fact Sheet for Healthcare Providers: BankingDealers.co.za This test is not yet approved or cleared  by the [place] FDA and has been authorized for detection and/or diagnosis of SARS-CoV-2 by FDA under an Emergency Use Authorization (EUA).  This EUA will remain in effect (meaning this test can be used) for the duration of the COVID-19 declaration under Section 564(b)(1) of the Act, 21 U.S.C. section 360bbb-3(b)(1), unless the authorization is terminated or revoked sooner. Performed at [facility], [place] [address], [place], [place] [address]      Studies: CT ABDOMEN PELVIS WO CONTRAST  Addendum Date: 05/23/2020   ADDENDUM REPORT: 05/23/2020 15:58 ADDENDUM: The case was reviewed with Dr. Alvino Chapel. On physical exam the patient is tender within the left mid abdomen. There is a short segment of jejunum on images 36 through 46 demonstrating wall thickening. There is some mild mesenteric fat stranding in this region as well. Differential would include short segment inflammatory/infectious enteritis versus decompressed bowel due to peristalsis. Electronically Signed   By: Randa Ngo M.D.   On: 05/23/2020 15:58   Result Date: 05/23/2020 CLINICAL DATA:  Nausea, abdominal pain EXAM: CT ABDOMEN AND PELVIS WITHOUT CONTRAST TECHNIQUE: Multidetector CT imaging of the abdomen and pelvis was performed following the standard protocol without IV contrast. COMPARISON:  05/23/2020,  03/25/2020 FINDINGS: Lower chest: No acute pleural or parenchymal lung disease. Hepatobiliary: No focal liver abnormality is seen. Status post cholecystectomy. No biliary dilatation. Pancreas: Unremarkable. No pancreatic ductal dilatation or surrounding inflammatory changes. Spleen: Normal in size without focal abnormality. Adrenals/Urinary Tract: Bilateral nonobstructing renal calculi are identified. Within the lower pole right kidney there are 2 discrete calyceal calculi largest measuring 3 mm. Increasing calcification in the perinephric tissues adjacent to the lower pole right kidney measuring 6 mm likely reflects sequela from prior percutaneous nephrostomy. Multiple nonobstructing left renal calculi are seen, largest in the lower pole calices measuring up to 15 mm. Again, there are linear calcifications extending through the posterior aspect of the interpolar left kidney likely representing sequela from previous percutaneous nephrostomy,  stable. There is mild distension of the bilateral renal pelves and proximal ureters, likely due to extrinsic compression on the ureters by an enlarged leiomyomatous uterus. No distal urinary tract calculi. The bladder is minimally distended with no gross abnormalities. The adrenals are stable. Stomach/Bowel: No bowel obstruction or ileus. Large diverticulum off the 2/3 portion duodenum again noted, without complication. No bowel wall thickening or inflammatory change. Normal appendix right lower quadrant. Vascular/Lymphatic: Aortic atherosclerosis. No enlarged abdominal or pelvic lymph nodes. Reproductive: Enlarged leiomyomatous uterus is again identified, measuring 19.2 by 20.4 x 14.3 cm. There are no adnexal masses. Other: There is a fat containing umbilical hernia. Significant fat stranding is seen within the herniated mesenteric fat, consistent with incarcerated hernia. No bowel herniation. There is no free fluid or free gas. Musculoskeletal: There are no acute or  destructive bony lesions. Stable symmetrical sclerosis of the sacroiliac joints. Reconstructed images demonstrate no additional findings. IMPRESSION: 1. Incarcerated fat containing umbilical hernia. 2. Bilateral nonobstructing renal calculi, stable. Calcifications along the posterior margins of the bilateral kidneys consistent with previous percutaneous nephrostomies, stable. 3. Enlarged leiomyomatous uterus. 4. Mild distension of the bilateral renal pelves and proximal ureters, likely due to extrinsic compression on the ureters by an enlarged leiomyomatous uterus. 5. Aortic Atherosclerosis (ICD10-I70.0). Electronically Signed: By: Randa Ngo M.D. On: 05/23/2020 15:44   DG Abdomen Acute W/Chest  Result Date: 05/23/2020 CLINICAL DATA:  Abdominal pain with bloating EXAM: DG ABDOMEN ACUTE W/ 1V CHEST COMPARISON:  Abdominal radiograph May 21, 2020. CT abdomen and pelvis March 25, 2020 FINDINGS: PA chest: Lungs are clear. Heart size and pulmonary vascularity are normal. No adenopathy. Supine and upright abdomen: Pelvic mass better appreciated on CT and more recent abdominal radiographic examination. There is no appreciable bowel dilatation or air-fluid level to suggest bowel obstruction. No free air. There are surgical clips in the right upper quadrant. Scattered renal calculi again noted, largest seen on the left measuring approximately 1 cm. Surgical clips right upper quadrant. IMPRESSION: 1.  No bowel obstruction or free air. 2.  Multiple renal calculi bilaterally, larger on the left, stable. 3. Pelvic mass, less well seen currently than on previous study. Mass known to represent enlarged leiomyomatous uterus. 4.  Lungs clear. Electronically Signed   By: Lowella Grip III M.D.   On: 05/23/2020 14:09   DG C-Arm 1-60 Min-No Report  Result Date: 05/24/2020 Fluoroscopy was utilized by the requesting physician.  No radiographic interpretation.      Flora Lipps, MD  [facility] 05/25/2020

## 2020-05-26 ENCOUNTER — Telehealth: Payer: Self-pay | Admitting: Obstetrics and Gynecology

## 2020-05-26 LAB — GLUCOSE, CAPILLARY
Glucose-Capillary: 121 mg/dL — ABNORMAL HIGH (ref 70–99)
Glucose-Capillary: 142 mg/dL — ABNORMAL HIGH (ref 70–99)

## 2020-05-26 LAB — BASIC METABOLIC PANEL
Anion gap: 8 (ref 5–15)
BUN: 55 mg/dL — ABNORMAL HIGH (ref 6–20)
CO2: 26 mmol/L (ref 22–32)
Calcium: 8.4 mg/dL — ABNORMAL LOW (ref 8.9–10.3)
Chloride: 102 mmol/L (ref 98–111)
Creatinine, Ser: 2.37 mg/dL — ABNORMAL HIGH (ref 0.44–1.00)
GFR calc Af Amer: 26 mL/min — ABNORMAL LOW (ref >60)
GFR calc non Af Amer: 23 mL/min — ABNORMAL LOW (ref >60)
Glucose, Bld: 111 mg/dL — ABNORMAL HIGH (ref 70–99)
Potassium: 3.6 mmol/L (ref 3.5–5.1)
Sodium: 136 mmol/L (ref 135–145)

## 2020-05-26 LAB — CBC
HCT: 30 % — ABNORMAL LOW (ref 36.0–46.0)
Hemoglobin: 10 g/dL — ABNORMAL LOW (ref 12.0–15.0)
MCH: 29.4 pg (ref 26.0–34.0)
MCHC: 33.3 g/dL (ref 30.0–36.0)
MCV: 88.2 fL (ref 80.0–100.0)
Platelets: 153 10*3/uL (ref 150–400)
RBC: 3.4 MIL/uL — ABNORMAL LOW (ref 3.87–5.11)
RDW: 13.8 % (ref 11.5–15.5)
WBC: 4.5 10*3/uL (ref 4.0–10.5)
nRBC: 0 % (ref 0.0–0.2)

## 2020-05-26 LAB — MAGNESIUM: Magnesium: 2.9 mg/dL — ABNORMAL HIGH (ref 1.7–2.4)

## 2020-05-26 MED ORDER — SENNOSIDES-DOCUSATE SODIUM 8.6-50 MG PO TABS: 1.0000 | ORAL_TABLET | Freq: Every day | ORAL | 0 refills | Status: DC

## 2020-05-26 MED ORDER — LOSARTAN POTASSIUM 50 MG PO TABS
50.0000 mg | ORAL_TABLET | Freq: Every day | ORAL | 3 refills | Status: DC
Start: 2020-05-29 — End: 2020-07-21

## 2020-05-26 MED ORDER — TORSEMIDE 20 MG PO TABS: 40.0000 mg | ORAL_TABLET | Freq: Two times a day (BID) | ORAL | 6 refills | Status: DC

## 2020-05-26 NOTE — Progress Notes (Signed)
Discussed with patient and spouse discharge instructions, they verbalized agreement and understanding.  Patient to leave in private vehicle with all belongings.   

## 2020-05-26 NOTE — Telephone Encounter (Signed)
Spoke to patient to get her scheduled with a follow-up appointment with Dr. Roselie Awkward. Patient stated she is following up with a different OBGYN office that will be doing her surgery. Follow up appointment was not made

## 2020-05-26 NOTE — Discharge Summary (Signed)
Physician Discharge Summary  Adrienne Lane ZOX:[id number] DOB: 1966-02-11 DOA: 05/23/2020  PCP: Nicolette Bang, DO  Admit date: 05/23/2020 Discharge date: 05/26/2020  Admitted From: Home  Discharge disposition: Home   Recommendations for Outpatient Follow-Up:    Follow up with your primary care provider in one week.   Check CBC, BMP, magnesium in the next visit.  Follow-up up with [facility] for stent follow-up and removal.  Office to contact  Follow-up with gynecologist for fibroid uterus removal as soon as possible due to obstructive uropathy requiring ureteric stent   Discharge Diagnosis:   Principal Problem:   AKI (acute kidney injury) ([place]) Active Problems:   CHF (congestive heart failure) ([place])   Essential hypertension   Uterine fibroid   Frequent PVCs   Chronic kidney disease (CKD), stage IV (severe) ([place])   Chronic a-fib ([place])   Hydroureter on right   Discharge Condition: Improved.  Diet recommendation: Low sodium, heart healthy.     Wound care: None.  Code status: Full.   History of Present Illness:   Adrienne Lane a 54 y.o.femalewith medical history significant ofA. fib, diastolic heart failure EF 50 to 55%, CKD 4, fibroid uterus, history of nephrolithiasis, iron deficiency anemia, hypertension, obesity who presented to the hospital with complaints of 1 week of abdominal pain and changes in bowel and bladder frequency with decreased urinary output epigastric periumbilical pain not improved with over-the-counter medication heat or ice.  In the ED, patient had a CT scan of the abdomen which showed bilateral hydronephrosis and large fibroid uterus.  Creatinine on presentation was 3.24 with mild hypokalemia at 3.4.Given elevated creatinine, intractable abdominal pain poor p.o. intake and changes in bowel and bladder habits hospitalist was called for further evaluation and admission.   Hospital Course:   Following conditions were  addressed during hospitalization as listed below,  AKI on chronic renal disease stage IV likely secondary to hydronephrosis active uropathy.  Patient did have a history of bilateral staghorn calculus removal with residual hydroureter.    Urology was consulted and patient was taken for cystoscopy with with bilateral ureteral stent placement with bilateral retrograde pyelography.  Postprocedure patient's renal function started to improve and was almost at baseline prior to discharge.  Patient remained Afebrile and without leukocytosis.  He did not have any urinary issues.  Spoke with urology prior to discharge and recommend continuation of stents until fibroid uterus surgery is done.  Hypokalemia.    Improved after replacement.  Large fibroid uterus, likely contributing to obstructive uropathy/hydronephrosis.  GYN  Dr. Roselie Awkward had seen the patient previously who had recommended surgical removal of fibroid but because of A. fib and heart failure diagnosis it had not been performed.   I have spoken with the patient about the need for early follow-up and discussion about surgery as soon as possible.   Constipation, secondary to above  Continue stool softener at home.  Chronic heart failure, diastolic, EF 50 to 81%, Remained compensated. Continue carvedilol, resume ARB and diuretic in the on 05/29/2020 due to recent AKI.  A. fib, rate controlled, frequent PVCs.  With episodes of bradycardia frequent PVCs bigeminy.  Spoke with Dr. Marlou Porch, cardiology.  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 Lovena Le and Bensimhon in the past.  on mexiletine and Coreg.  Patient is asymptomatic from it.    Will need to follow-up with cardiology as outpatient.   History of nephrolithiasis status post bilateral staghorn calculi removal earlier this year.  Urology  on board.  Anemia, iron deficiency, at baseline. Continue home iron supplementation  Essential hypertension Continue amlodipine,  carvedilol, hydralazine, isosorbidedischarge.  Diabetes mellittus type II,poorlycontrolled Will resume diabetic diet and insulin at home.  Disposition.  At this time, patient is stable for disposition home.  Select follow-up with your primary care provider, urology, gynecology as outpatient.  Medical Consultants:    Urology  Procedures:     Cystoscopy with bilateral ureteric stent insertion on 05/24/2020 with Dr. Simone Curia [facility].. Subjective:   Today, patient feels okay complains of mild epigastric discomfort, no urinary urgency frequency dysuria.  No fever chills or rigor.  Discharge Exam:   Vitals:   05/26/20 0613 05/26/20 0613  BP: (!) 147/80 (!) 147/80  Pulse: (!) 57 (!) 58  Resp: 18 18  Temp: 98.3 F (36.8 C) 98.3 F (36.8 C)  SpO2: 96% 96%   Vitals:   05/25/20 2107 05/26/20 0526 05/26/20 0613 05/26/20 0613  BP: 128/66 131/73 (!) 147/80 (!) 147/80  Pulse: 75 (!) 55 (!) 57 (!) 58  Resp: 18 (!) 21 18 18   Temp: 98 F (36.7 C) 98 F (36.7 C) 98.3 F (36.8 C) 98.3 F (36.8 C)  TempSrc: Oral Oral Oral Oral  SpO2: 100% 100% 96% 96%  Weight:      Height:       Body mass index is 34.21 kg/m. General: Alert awake, not in obvious distress, obese HENT: pupils equally reacting to light,  No scleral pallor or icterus noted. Oral mucosa is moist.  Chest:  Clear breath sounds.  Diminished breath sounds bilaterally. No crackles or wheezes.  CVS: S1 &S2 heard. No murmur.  Regular rate and rhythm. Abdomen: Soft, mild epigastric discomfort, suprapubic discomfort, nondistended.  Bowel sounds are heard.   Extremities: No cyanosis, clubbing or edema.  Peripheral pulses are palpable. Psych: Alert, awake and oriented, normal mood CNS:  No cranial nerve deficits.  Power equal in all extremities.   Skin: Warm and dry.  No rashes noted.  The results of significant diagnostics from this hospitalization (including imaging, microbiology, ancillary and laboratory) are listed  below for reference.     Diagnostic Studies:   CT ABDOMEN PELVIS WO CONTRAST  Addendum Date: 05/23/2020   ADDENDUM REPORT: 05/23/2020 15:58 ADDENDUM: The case was reviewed with Dr. Alvino Chapel. On physical exam the patient is tender within the left mid abdomen. There is a short segment of jejunum on images 36 through 46 demonstrating wall thickening. There is some mild mesenteric fat stranding in this region as well. Differential would include short segment inflammatory/infectious enteritis versus decompressed bowel due to peristalsis. Electronically Signed   By: Randa Ngo M.D.   On: 05/23/2020 15:58   Result Date: 05/23/2020 CLINICAL DATA:  Nausea, abdominal pain EXAM: CT ABDOMEN AND PELVIS WITHOUT CONTRAST TECHNIQUE: Multidetector CT imaging of the abdomen and pelvis was performed following the standard protocol without IV contrast. COMPARISON:  05/23/2020, 03/25/2020 FINDINGS: Lower chest: No acute pleural or parenchymal lung disease. Hepatobiliary: No focal liver abnormality is seen. Status post cholecystectomy. No biliary dilatation. Pancreas: Unremarkable. No pancreatic ductal dilatation or surrounding inflammatory changes. Spleen: Normal in size without focal abnormality. Adrenals/Urinary Tract: Bilateral nonobstructing renal calculi are identified. Within the lower pole right kidney there are 2 discrete calyceal calculi largest measuring 3 mm. Increasing calcification in the perinephric tissues adjacent to the lower pole right kidney measuring 6 mm likely reflects sequela from prior percutaneous nephrostomy. Multiple nonobstructing left renal calculi are seen, largest in the lower pole  calices measuring up to 15 mm. Again, there are linear calcifications extending through the posterior aspect of the interpolar left kidney likely representing sequela from previous percutaneous nephrostomy, stable. There is mild distension of the bilateral renal pelves and proximal ureters, likely due to extrinsic  compression on the ureters by an enlarged leiomyomatous uterus. No distal urinary tract calculi. The bladder is minimally distended with no gross abnormalities. The adrenals are stable. Stomach/Bowel: No bowel obstruction or ileus. Large diverticulum off the 2/3 portion duodenum again noted, without complication. No bowel wall thickening or inflammatory change. Normal appendix right lower quadrant. Vascular/Lymphatic: Aortic atherosclerosis. No enlarged abdominal or pelvic lymph nodes. Reproductive: Enlarged leiomyomatous uterus is again identified, measuring 19.2 by 20.4 x 14.3 cm. There are no adnexal masses. Other: There is a fat containing umbilical hernia. Significant fat stranding is seen within the herniated mesenteric fat, consistent with incarcerated hernia. No bowel herniation. There is no free fluid or free gas. Musculoskeletal: There are no acute or destructive bony lesions. Stable symmetrical sclerosis of the sacroiliac joints. Reconstructed images demonstrate no additional findings. IMPRESSION: 1. Incarcerated fat containing umbilical hernia. 2. Bilateral nonobstructing renal calculi, stable. Calcifications along the posterior margins of the bilateral kidneys consistent with previous percutaneous nephrostomies, stable. 3. Enlarged leiomyomatous uterus. 4. Mild distension of the bilateral renal pelves and proximal ureters, likely due to extrinsic compression on the ureters by an enlarged leiomyomatous uterus. 5. Aortic Atherosclerosis (ICD10-I70.0). Electronically Signed: By: Randa Ngo M.D. On: 05/23/2020 15:44   DG Abdomen Acute W/Chest  Result Date: 05/23/2020 CLINICAL DATA:  Abdominal pain with bloating EXAM: DG ABDOMEN ACUTE W/ 1V CHEST COMPARISON:  Abdominal radiograph May 21, 2020. CT abdomen and pelvis March 25, 2020 FINDINGS: PA chest: Lungs are clear. Heart size and pulmonary vascularity are normal. No adenopathy. Supine and upright abdomen: Pelvic mass better appreciated on CT and more  recent abdominal radiographic examination. There is no appreciable bowel dilatation or air-fluid level to suggest bowel obstruction. No free air. There are surgical clips in the right upper quadrant. Scattered renal calculi again noted, largest seen on the left measuring approximately 1 cm. Surgical clips right upper quadrant. IMPRESSION: 1.  No bowel obstruction or free air. 2.  Multiple renal calculi bilaterally, larger on the left, stable. 3. Pelvic mass, less well seen currently than on previous study. Mass known to represent enlarged leiomyomatous uterus. 4.  Lungs clear. Electronically Signed   By: Lowella Grip III M.D.   On: 05/23/2020 14:09   DG C-Arm 1-60 Min-No Report  Result Date: 05/24/2020 Fluoroscopy was utilized by the requesting physician.  No radiographic interpretation.     Labs:   Basic Metabolic Panel: Recent Labs  Lab 05/23/20 0917 05/23/20 0917 05/23/20 1007 05/23/20 1007 05/24/20 0336 05/24/20 0336 05/25/20 0437 05/26/20 0759  NA 140  --  141  --  137  --  133* 136  K 3.3*   < > 3.4*   < > 3.1*   < > 3.6 3.6  CL 100  --  100  --  101  --  98 102  CO2 29  --  29  --  27  --  23 26  GLUCOSE 194*  --  123*  --  104*  --  256* 111*  BUN 36*  --  41*  --  45*  --  53* 55*  CREATININE 3.24*  --  3.23*  --  3.38*  --  2.93* 2.37*  CALCIUM 9.5  --  9.4  --  8.2*  --  8.1* 8.4*  MG  --   --   --   --   --   --  3.0* 2.9*  PHOS  --   --   --   --   --   --  3.9  --    < > = values in this interval not displayed.   GFR Estimated Creatinine Clearance: 34.7 mL/min (A) (by C-G formula based on SCr of 2.37 mg/dL (H)). Liver Function Tests: Recent Labs  Lab 05/23/20 1007 05/25/20 0437  AST 20 19  ALT 15 14  ALKPHOS 75 57  BILITOT 0.8 0.5  PROT 7.8 6.5  ALBUMIN 3.6 3.0*   Recent Labs  Lab 05/23/20 1007  LIPASE 21   No results for input(s): AMMONIA in the last 168 hours. Coagulation profile No results for input(s): INR, PROTIME in the last 168  hours.  CBC: Recent Labs  Lab 05/23/20 1007 05/24/20 0336 05/25/20 0437 05/26/20 0759  WBC 9.7 5.2 7.5 4.5  HGB 12.4 9.6* 10.8* 10.0*  HCT 37.9 29.6* 32.4* 30.0*  MCV 90.5 91.1 88.8 88.2  PLT 189 157 167 153   Cardiac Enzymes: No results for input(s): CKTOTAL, CKMB, CKMBINDEX, TROPONINI in the last 168 hours. BNP: Invalid input(s): POCBNP CBG: Recent Labs  Lab 05/25/20 1142 05/25/20 1639 05/25/20 2104 05/26/20 0806 05/26/20 1135  GLUCAP 180* 170* 236* 121* 142*   D-Dimer No results for input(s): DDIMER in the last 72 hours. Hgb A1c No results for input(s): HGBA1C in the last 72 hours. Lipid Profile No results for input(s): CHOL, HDL, LDLCALC, TRIG, CHOLHDL, LDLDIRECT in the last 72 hours. Thyroid function studies No results for input(s): TSH, T4TOTAL, T3FREE, THYROIDAB in the last 72 hours.  Invalid input(s): FREET3 Anemia work up No results for input(s): VITAMINB12, FOLATE, FERRITIN, TIBC, IRON, RETICCTPCT in the last 72 hours. Microbiology Recent Results (from the past 240 hour(s))  SARS Coronavirus 2 by RT PCR (hospital order, performed in [facility] hospital lab) Nasopharyngeal Nasopharyngeal Swab     Status: None   Collection Time: 05/23/20  3:05 PM   Specimen: Nasopharyngeal Swab  Result Value Ref Range Status   SARS Coronavirus 2 NEGATIVE NEGATIVE Final    Comment: (NOTE) SARS-CoV-2 target nucleic acids are NOT DETECTED. The SARS-CoV-2 RNA is generally detectable in upper and lower respiratory specimens during the acute phase of infection. The lowest concentration of SARS-CoV-2 viral copies this assay can detect is 250 copies / mL. A negative result does not preclude SARS-CoV-2 infection and should not be used as the sole basis for treatment or other patient management decisions.  A negative result may occur with improper specimen collection / handling, submission of specimen other than nasopharyngeal swab, presence of viral mutation(s) within  the areas targeted by this assay, and inadequate number of viral copies (<250 copies / mL). A negative result must be combined with clinical observations, patient history, and epidemiological information. Fact Sheet for Patients:   StrictlyIdeas.no Fact Sheet for Healthcare Providers: BankingDealers.co.za This test is not yet approved or cleared  by the [place] FDA and has been authorized for detection and/or diagnosis of SARS-CoV-2 by FDA under an Emergency Use Authorization (EUA).  This EUA will remain in effect (meaning this test can be used) for the duration of the COVID-19 declaration under Section 564(b)(1) of the Act, 21 U.S.C. section 360bbb-3(b)(1), unless the authorization is terminated or revoked sooner. Performed at [facility], [place]  [address], [place], [place] [address]      Discharge Instructions:   Discharge Instructions    Diet - low sodium heart healthy   Complete by: As directed    Discharge instructions   Complete by: As directed    Follow-up with your primary care provider in 1 week.  Check blood work at that time.  Resume taking water pill (torsemide) and losartan from 05/29/2020.  Please get an appointment with your gynecologist as soon as possible to discuss about fibroid uterus surgery.   Increase activity slowly   Complete by: As directed      Allergies as of 05/26/2020      Reactions   Adhesive [tape]    Tears skin, can tolerate paper tape      Medication List    STOP taking these medications   sulfamethoxazole-trimethoprim 400-80 MG tablet Commonly known as: BACTRIM     TAKE these medications   amLODipine 5 MG tablet Commonly known as: NORVASC Take 1 tablet (5 mg total) by mouth daily.   apixaban 5 MG Tabs tablet Commonly known as: ELIQUIS Take 1 tablet (5 mg total) by mouth 2 (two) times daily. Resume when bleeding has stopped in your urine.   carvedilol 25 MG  tablet Commonly known as: COREG Take 1 tablet (25 mg total) by mouth 2 (two) times daily with a meal.   ferrous sulfate 325 (65 FE) MG tablet Take 1 tablet (325 mg total) by mouth daily with breakfast.   glucose blood test strip Use as instructed   hydrALAZINE 100 MG tablet Commonly known as: APRESOLINE Take 1 tablet (100 mg total) by mouth every 8 (eight) hours.   insulin glargine 100 UNIT/ML injection Commonly known as: LANTUS Inject 15 Units into the skin every morning.   isosorbide mononitrate 60 MG 24 hr tablet Commonly known as: IMDUR Take 1 tablet (60 mg total) by mouth daily.   losartan 50 MG tablet Commonly known as: COZAAR Take 1 tablet (50 mg total) by mouth daily. Start taking on: May 29, 2020 What changed: These instructions start on May 29, 2020. If you are unsure what to do until then, ask your doctor or other care provider.   metolazone 5 MG tablet Commonly known as: ZAROXOLYN Take only as directed by CHF clinic What changed:   how much to take  how to take this  when to take this  additional instructions   mexiletine 150 MG capsule Commonly known as: MEXITIL Take 2 capsules (300 mg total) by mouth every 12 (twelve) hours.   onetouch ultrasoft lancets Check CBG twice a day   potassium chloride 10 MEQ tablet Commonly known as: KLOR-CON Take 4 tablets (40 mEq total) by mouth daily.   senna-docusate 8.6-50 MG tablet Commonly known as: Senokot-S Take 1 tablet by mouth at bedtime.   tamsulosin 0.4 MG Caps capsule Commonly known as: FLOMAX Take 0.4 mg by mouth daily.   torsemide 20 MG tablet Commonly known as: DEMADEX Take 2 tablets (40 mg total) by mouth 2 (two) times daily. Start taking on: May 29, 2020 What changed: These instructions start on May 29, 2020. If you are unsure what to do until then, ask your doctor or other care provider.   traMADol 50 MG tablet Commonly known as: Ultram Take 1-2 tablets (50-100 mg total) by mouth  every 6 (six) hours as needed for moderate pain.   WOMENS MULTI PO Take 1 tablet by mouth daily.      Follow-up Information  Nicolette Bang, DO. Schedule an appointment as soon as possible for a visit in 1 week(s).   Specialty: Family Medicine Why: For regular follow-up.  Check blood work at that time. Contact information: [place] [id number] [phone]        Donato Heinz, MD .   Specialty: Cardiology Contact information: [address] [place] [address] [phone]        Robley Fries, MD Follow up.   Specialty: Urology Why: Stent follow-up.  Office to contact you.  If you do not hear from the office please call in few days. Contact information: [address] [place] [address] [phone]            Time coordinating discharge: 39 minutes  Signed:  Rawlins Stuard  [facility] 05/26/2020, 1:16 PM

## 2020-05-27 ENCOUNTER — Telehealth: Payer: Self-pay

## 2020-05-27 NOTE — Telephone Encounter (Signed)
Transition Care Management Follow-up Telephone Call  Date of discharge and from where: 05/26/2020, [facility]   How have you been since you were released from the hospital? She stated she is feeling " pretty good."  Any questions or concerns?  none at this time  Items Reviewed:  Did the pt receive and understand the discharge instructions provided? Orpah Greek she has no questions.   Medications obtained and verified? she said that she still needs to pick up the senokot but she has all other medications. She is aware that there is a change with the losartan and torsemide and she did not have any questions about the med regime.   Any new allergies since your discharge? none reported   Do you have support at home?yes, she said she has plenty of help.  Other (ie: DME, Home Health, etc) no home health or DME ordered.  Has glucometer. Blood sugar this morning - 163.  Functional Questionnaire: (I = Independent and D = Dependent) ADL's: independent  Follow up appointments reviewed:    PCP Hospital f/u appt confirmed?she would like to be seen 05/29/2020 when she is scheduled for labwork. Informed her this CM would check with [facility].to see if she can also be seen by Dr Juleen China on 05/29/2020  Specialist Hospital f/u appt confirmed? she stated that she saw GYN today. Cardiology - 06/02/2020. Appt with [facility] - 06/26/2020.   Are transportation arrangements needed? No, she has transportation.   If their condition worsens, is the pt aware to call  their PCP or go to the ED? yes  Was the patient provided with contact information for the PCP's office or ED?  she has the phone number for the clinic  Was the pt encouraged to call back with questions or concerns? yes

## 2020-05-28 ENCOUNTER — Telehealth (INDEPENDENT_AMBULATORY_CARE_PROVIDER_SITE_OTHER): Payer: Self-pay | Admitting: Internal Medicine

## 2020-05-28 ENCOUNTER — Encounter: Payer: Self-pay | Admitting: Internal Medicine

## 2020-05-28 DIAGNOSIS — D259 Leiomyoma of uterus, unspecified: Secondary | ICD-10-CM

## 2020-05-28 DIAGNOSIS — Z794 Long term (current) use of insulin: Secondary | ICD-10-CM

## 2020-05-28 DIAGNOSIS — N1330 Unspecified hydronephrosis: Secondary | ICD-10-CM

## 2020-05-28 DIAGNOSIS — N184 Chronic kidney disease, stage 4 (severe): Secondary | ICD-10-CM

## 2020-05-28 DIAGNOSIS — Z09 Encounter for follow-up examination after completed treatment for conditions other than malignant neoplasm: Secondary | ICD-10-CM

## 2020-05-28 DIAGNOSIS — E1165 Type 2 diabetes mellitus with hyperglycemia: Secondary | ICD-10-CM

## 2020-05-28 NOTE — Progress Notes (Signed)
Virtual Visit via Telephone Note  I connected with Adrienne Lane, on 05/28/2020 at 3:59 PM by telephone due to the COVID-19 pandemic and verified that I am speaking with the correct person using two identifiers.   Consent: I discussed the limitations, risks, security and privacy concerns of performing an evaluation and management service by telephone and the availability of in person appointments. I also discussed with the patient that there may be a patient responsible charge related to this service. The patient expressed understanding and agreed to proceed.   Location of Patient: Home   Location of Provider: Clinic    Persons participating in Telemedicine visit: Amiah Frohlich [facility] Dr. Juleen China      History of Present Illness: Patient has visit for hospital d/c follow up. Was hospitalized from 6/4-6/7 for AKI likely related to large fibroid uterus causing bilateral hydronephrosis. Patient now has bilateral ureteral stents in place. Will remain in place until uterus is removed.   Patient reports she saw Dr. Alwyn Pea with [facility] yesterday for fibroid uterus. Planning for hysterectomy. Waiting to hear back for scheduled date for surgery.    Copied from Discharge Summary:    Recommendations for Outpatient Follow-Up:    Follow up with your primary care provider in one week.   Check CBC, BMP, magnesium in the next visit.  [facility] up with [facility] for stent follow-up and removal.  Office to contact  Follow-up with gynecologist for fibroid uterus removal as soon as possible due to obstructive uropathy requiring ureteric stent  AKI on chronic renal disease stage IV likely secondary to hydronephrosis active uropathy. Patient did have a history of bilateral staghorn calculus removal with residual hydroureter.   Urology was consulted and patient was taken for cystoscopy with with bilateral ureteral stent placement with bilateral retrograde pyelography.   Postprocedure patient's renal function started to improve and was almost at baseline prior to discharge.  Patient remainedAfebrileand without leukocytosis.  He did not have any urinary issues.  Spoke with urology prior to discharge and recommend continuation of stents until fibroid uterus surgery is done.  Hypokalemia.  Improved after replacement.  Large fibroid uterus, likely contributing to obstructive uropathy/hydronephrosis. GYN Dr. Roselie Awkward had seen the patient previously who had recommended surgical removal of fibroid but because of A. fib and heart failure diagnosis it had not been performed.  I have spoken with the patient about the need for early follow-up and discussion about surgery as soon as possible.   Constipation, secondary to above Continue stool softener at home.  Chronic heart failure, diastolic, EF 50 to 37%, Remained compensated.Continue carvedilol, resume ARB and diuretic in the on 05/29/2020 due to recent AKI.  A. fib, rate controlled, frequent PVCs. With episodes of bradycardia frequent PVCs bigeminy.Spoke with Dr. Marlou Porch, cardiology. 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 Lovena Le and [place] in the past. on mexiletine and Coreg. Patient is asymptomatic from it.   Will need to follow-up with cardiology as outpatient.  History of nephrolithiasis status post bilateral staghorn calculi removal earlier this year. Urology on board.  Anemia, iron deficiency, at baseline. Continue home iron supplementation  Essential hypertension Continue amlodipine, carvedilol, hydralazine, isosorbidedischarge.  Diabetes mellittus type II,poorlycontrolled Will resume diabetic diet and insulin at home.  Disposition.  At this time, patient is stable for disposition home.  Select follow-up with your primary care provider, urology, gynecology as outpatient.    CT Abdomen:  IMPRESSION: 1. Incarcerated fat containing umbilical hernia. 2.  Bilateral nonobstructing  renal calculi, stable. Calcifications along the posterior margins of the bilateral kidneys consistent with previous percutaneous nephrostomies, stable. 3. Enlarged leiomyomatous uterus. 4. Mild distension of the bilateral renal pelves and proximal ureters, likely due to extrinsic compression on the ureters by an enlarged leiomyomatous uterus. 5. Aortic Atherosclerosis (ICD10-I70.0).  Past Medical History:  Diagnosis Date   Atrial fibrillation with RVR ([place])    Bigeminy 01/2020   CHF (congestive heart failure) ([place]) 10/20/2019   CKD (chronic kidney disease), stage IV (HCC)    Diabetes mellitus without complication ([place])    DOE (dyspnea on exertion)    walking upstairs or up hill resolves in one minute   Dyspnea    with activity   Fibroids    History of kidney stones    History of recent blood transfusion 02/26/2020   Hypertension    Iron deficiency anemia    Non-ischemic cardiomyopathy (HCC)    tachycardia induced   Obese    Peripheral edema    Premature ventricular contractions (PVCs) (VPCs)    Umbilical hernia    Umbilical hernia    Wears glasses    Allergies  Allergen Reactions   Adhesive [Tape]     Tears skin, can tolerate paper tape    Current Outpatient Medications on File Prior to Visit  Medication Sig Dispense Refill   amLODipine (NORVASC) 5 MG tablet Take 1 tablet (5 mg total) by mouth daily. 30 tablet 6   apixaban (ELIQUIS) 5 MG TABS tablet Take 1 tablet (5 mg total) by mouth 2 (two) times daily. Resume when bleeding has stopped in your urine. 60 tablet 6   carvedilol (COREG) 25 MG tablet Take 1 tablet (25 mg total) by mouth 2 (two) times daily with a meal. 180 tablet 3   ferrous sulfate 325 (65 FE) MG tablet Take 1 tablet (325 mg total) by mouth daily with breakfast. 30 tablet 3   glucose blood test strip Use as instructed 100 each 12   hydrALAZINE (APRESOLINE) 100 MG tablet Take 1 tablet (100 mg total) by  mouth every 8 (eight) hours. 90 tablet 6   insulin glargine (LANTUS) 100 UNIT/ML injection Inject 15 Units into the skin every morning.      isosorbide mononitrate (IMDUR) 60 MG 24 hr tablet Take 1 tablet (60 mg total) by mouth daily. 30 tablet 6   Lancets (ONETOUCH ULTRASOFT) lancets Check CBG twice a day 60 each 5   [START ON 05/29/2020] losartan (COZAAR) 50 MG tablet Take 1 tablet (50 mg total) by mouth daily. 90 tablet 3   metolazone (ZAROXOLYN) 5 MG tablet Take only as directed by CHF clinic (Patient taking differently: Take 10 mg by mouth See admin instructions. Per CHF Clinic take 10mg  by mouth on 6.3.21 and 6.4.21.) 10 tablet 0   mexiletine (MEXITIL) 150 MG capsule Take 2 capsules (300 mg total) by mouth every 12 (twelve) hours. 120 capsule 6   Multiple Vitamins-Minerals (WOMENS MULTI PO) Take 1 tablet by mouth daily.     potassium chloride (KLOR-CON) 10 MEQ tablet Take 4 tablets (40 mEq total) by mouth daily. 120 tablet 6   senna-docusate (SENOKOT-S) 8.6-50 MG tablet Take 1 tablet by mouth at bedtime. 100 tablet 0   tamsulosin (FLOMAX) 0.4 MG CAPS capsule Take 0.4 mg by mouth daily.     [START ON 05/29/2020] torsemide (DEMADEX) 20 MG tablet Take 2 tablets (40 mg total) by mouth 2 (two) times daily. 120 tablet 6   traMADol (ULTRAM) 50 MG tablet Take  1-2 tablets (50-100 mg total) by mouth every 6 (six) hours as needed for moderate pain. 15 tablet 0   No current facility-administered medications on file prior to visit.    Observations/Objective: NAD. Speaking clearly.  Work of breathing normal.  Alert and oriented. Mood appropriate.   Assessment and Plan: 1. Hospital discharge follow-up - Basic metabolic panel; Future - CBC; Future - Magnesium; Future  2. Bilateral hydronephrosis Stents currently in place. Followed by urology.   3. Uterine leiomyoma, unspecified location Followed by GYN. Planning for hysterectomy to relieve post renal obstruction and subsequent  abnormal kidney function.   4. Chronic kidney disease (CKD), stage IV (severe) ([place])  5. Type 2 diabetes mellitus with hyperglycemia, with long-term current use of insulin (HCC) Was able to replace battery in meter and now working again. Fasting CBGs have been around 160s. Takes Lantus 15u per day. Instructed to increase by 1u daily for fasting CBGs >130 until reaches fasting CBG <130. If reaches Lantus 20u daily hold there and we will re-assess.  Counseled on Diabetic diet, my plate method, 818 minutes of moderate intensity exercise/week Blood sugar logs with fasting goals of 80-120 mg/dl, random of less than 180 and in the event of sugars less than 60 mg/dl or greater than 400 mg/dl encouraged to notify the clinic. Advised on the need for annual eye exams, annual foot exams, Pneumonia vaccine.     Follow Up Instructions: Lab visit    I discussed the assessment and treatment plan with the patient. The patient was provided an opportunity to ask questions and all were answered. The patient agreed with the plan and demonstrated an understanding of the instructions.   The patient was advised to call back or seek an in-person evaluation if the symptoms worsen or if the condition fails to improve as anticipated.     I provided 32 minutes total of non-face-to-face time during this encounter including median intraservice time, reviewing previous notes, investigations, ordering medications, medical decision making, coordinating care and patient verbalized understanding at the end of the visit.    Phill Myron, D.O. Primary Care at [facility]  05/28/2020, 3:59 PM

## 2020-05-29 ENCOUNTER — Other Ambulatory Visit: Payer: Self-pay

## 2020-05-29 DIAGNOSIS — Z09 Encounter for follow-up examination after completed treatment for conditions other than malignant neoplasm: Secondary | ICD-10-CM

## 2020-05-30 LAB — CBC
Hematocrit: 34 % (ref 34.0–46.6)
Hemoglobin: 11.4 g/dL (ref 11.1–15.9)
MCH: 29.1 pg (ref 26.6–33.0)
MCHC: 33.5 g/dL (ref 31.5–35.7)
MCV: 87 fL (ref 79–97)
Platelets: 197 10*3/uL (ref 150–450)
RBC: 3.92 x10E6/uL (ref 3.77–5.28)
RDW: 13.6 % (ref 11.7–15.4)
WBC: 5.8 10*3/uL (ref 3.4–10.8)

## 2020-05-30 LAB — BASIC METABOLIC PANEL
BUN/Creatinine Ratio: 18 (ref 9–23)
BUN: 34 mg/dL — ABNORMAL HIGH (ref 6–24)
CO2: 24 mmol/L (ref 20–29)
Calcium: 8.9 mg/dL (ref 8.7–10.2)
Chloride: 104 mmol/L (ref 96–106)
Creatinine, Ser: 1.88 mg/dL — ABNORMAL HIGH (ref 0.57–1.00)
GFR calc Af Amer: 35 mL/min/{1.73_m2} — ABNORMAL LOW (ref >59)
GFR calc non Af Amer: 30 mL/min/{1.73_m2} — ABNORMAL LOW (ref >59)
Glucose: 251 mg/dL — ABNORMAL HIGH (ref 65–99)
Potassium: 4 mmol/L (ref 3.5–5.2)
Sodium: 143 mmol/L (ref 134–144)

## 2020-05-30 LAB — MAGNESIUM: Magnesium: 1.8 mg/dL (ref 1.6–2.3)

## 2020-06-02 ENCOUNTER — Ambulatory Visit (HOSPITAL_COMMUNITY)
Admission: RE | Admit: 2020-06-02 | Discharge: 2020-06-02 | Disposition: A | Discharge status: Home / Self Care | Payer: Self-pay | Source: Ambulatory Visit | Attending: Internal Medicine | Admitting: Internal Medicine

## 2020-06-02 ENCOUNTER — Other Ambulatory Visit: Payer: Self-pay

## 2020-06-02 ENCOUNTER — Encounter (HOSPITAL_COMMUNITY): Payer: Self-pay | Admitting: Internal Medicine

## 2020-06-02 VITALS — BP 160/78 | HR 89 | Wt 236.0 lb

## 2020-06-02 DIAGNOSIS — E1122 Type 2 diabetes mellitus with diabetic chronic kidney disease: Secondary | ICD-10-CM | POA: Insufficient documentation

## 2020-06-02 DIAGNOSIS — E1165 Type 2 diabetes mellitus with hyperglycemia: Secondary | ICD-10-CM | POA: Insufficient documentation

## 2020-06-02 DIAGNOSIS — I5022 Chronic systolic (congestive) heart failure: Secondary | ICD-10-CM | POA: Insufficient documentation

## 2020-06-02 DIAGNOSIS — N1832 Chronic kidney disease, stage 3b: Secondary | ICD-10-CM

## 2020-06-02 DIAGNOSIS — D259 Leiomyoma of uterus, unspecified: Secondary | ICD-10-CM | POA: Insufficient documentation

## 2020-06-02 DIAGNOSIS — I493 Ventricular premature depolarization: Secondary | ICD-10-CM | POA: Insufficient documentation

## 2020-06-02 DIAGNOSIS — I130 Hypertensive heart and chronic kidney disease with heart failure and stage 1 through stage 4 chronic kidney disease, or unspecified chronic kidney disease: Secondary | ICD-10-CM | POA: Insufficient documentation

## 2020-06-02 DIAGNOSIS — N184 Chronic kidney disease, stage 4 (severe): Secondary | ICD-10-CM | POA: Insufficient documentation

## 2020-06-02 DIAGNOSIS — Z794 Long term (current) use of insulin: Secondary | ICD-10-CM | POA: Insufficient documentation

## 2020-06-02 DIAGNOSIS — I428 Other cardiomyopathies: Secondary | ICD-10-CM | POA: Insufficient documentation

## 2020-06-02 DIAGNOSIS — Z833 Family history of diabetes mellitus: Secondary | ICD-10-CM | POA: Insufficient documentation

## 2020-06-02 DIAGNOSIS — Z8249 Family history of ischemic heart disease and other diseases of the circulatory system: Secondary | ICD-10-CM | POA: Insufficient documentation

## 2020-06-02 DIAGNOSIS — Z79899 Other long term (current) drug therapy: Secondary | ICD-10-CM | POA: Insufficient documentation

## 2020-06-02 DIAGNOSIS — R0683 Snoring: Secondary | ICD-10-CM | POA: Insufficient documentation

## 2020-06-02 DIAGNOSIS — Z87442 Personal history of urinary calculi: Secondary | ICD-10-CM | POA: Insufficient documentation

## 2020-06-02 DIAGNOSIS — I480 Paroxysmal atrial fibrillation: Secondary | ICD-10-CM | POA: Insufficient documentation

## 2020-06-02 DIAGNOSIS — Z7901 Long term (current) use of anticoagulants: Secondary | ICD-10-CM | POA: Insufficient documentation

## 2020-06-02 MED ORDER — DAPAGLIFLOZIN PROPANEDIOL 10 MG PO TABS
10.0000 mg | ORAL_TABLET | Freq: Every day | ORAL | 6 refills | Status: DC
Start: 2020-06-02 — End: 2021-09-29

## 2020-06-02 MED FILL — FARXIGA 10 MG TABLET: 10 | 30 days supply | Qty: 30 | Fill #0

## 2020-06-02 NOTE — Progress Notes (Signed)
PCP: Dr Wynetta Emery  Primary Cardiologist: Dr Gardiner Rhyme HF MD: Dr [name] Nephorology: Dr Joelyn Oms   HPI: [name] is a 54 year old with a history of morbid obesity, HTN, DM, PAF, PVCs, and chronic systolic HF.   Admitted to [facility] 10/20/19 after a mechanical fall and landed on her hip. ECHO showed EF 20-25% with severe RV dysfunction as well. 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 Started on amio/ heparin drip and IV lasix. Renal function worsened. Abdominal Xray showed large staghorn calculi. CT Renal Stone showed bilateral staghorn calculi without hydronephrosis and severe anasarca. Urology consulted on 11/3 with mention of possible nephrostomy tubes but renal US was negative for hydronephrosis. GYN consulted for uterine mass. Mass was thought to be large fibroid that will need to follow up after discharge. Nephrology consulted for worsening renal failure. Transferred to [facility] for CVVHD. Overall diuresed 90 pounds. Later transitioned to torsemide 40 mg daily. Placed on carvedilol and mexiletine due to high PVC burden. Discharged on 11/12/19 with weight 255 pounds.   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 Saw Dr. Lovena Le who was considering PVC ablation. Doubted mexilitene is helping much.  Admitted 6/21 with AKI on CKD. CT scan of the abdomen which showed bilateral hydronephrosis and large fibroid uterus. Creatinine on presentation was 3.24. Bilateral ureteral stent placement with bilateral retrograde pyelography. Last creatinine 1.88  Returns for HF f/u. From heart perspective says she is doing OK. Mild SOB with exertion. Mild LE edema. No orthopnea or PND. Weight stable 228 ->229. Had a couple of doses of metolazone in May for volume overload. BP has been well controlled 110-120 range.    Echo 2/21 EF 40-45% hard to assess with PVCs. (read formally as 50-55%) RV mildly down No effusion.   ECHO 10/21/2019. EF 20-25%, pericardial  effusion, mild LVH, RA/LA dilated. RV down.   [place] 11/12/19  RA = 11 RV = 47/15 PA = 49/17 (32) PCW = 20 Fick cardiac output/index = 8.8/3.8 PVR = 1.4 WU FA sat = 97% PA sat = 70%, 72% High SVC = 68%  ROS: All systems negative except as listed in HPI, PMH and Problem List.  SH:  Social History   Socioeconomic History  . Marital status: Married    Spouse name: Not on file  . Number of children: 0  . Years of education: Not on file  . Highest education level: Associate degree: occupational, Hotel manager, or vocational program  Occupational History  . Not on file  Tobacco Use  . Smoking status: Never Smoker  . Smokeless tobacco: Never Used  Vaping Use  . Vaping Use: Never used  Substance and Sexual Activity  . Alcohol use: Not Currently  . Drug use: Never  . Sexual activity: Yes    Birth control/protection: None  Other Topics Concern  . Not on file  Social History Narrative  . Not on file   Social Determinants of Health   Financial Resource Strain:   . Difficulty of Paying Living Expenses:   Food Insecurity:   . Worried About Charity fundraiser in the Last Year:   . Arboriculturist in the Last Year:   Transportation Needs: No Transportation Needs  . Lack of Transportation (Medical): No  . Lack of Transportation (Non-Medical): No  Physical Activity:   . Days of Exercise per Week:   . Minutes of Exercise per Session:   Stress:   . Feeling of Stress :  Social Connections:   . Frequency of Communication with Friends and Family:   . Frequency of Social Gatherings with Friends and Family:   . Attends Religious Services:   . Active Member of Clubs or Organizations:   . Attends Archivist Meetings:   Marland Kitchen Marital Status:   Intimate Partner Violence:   . Fear of Current or Ex-Partner:   . Emotionally Abused:   Marland Kitchen Physically Abused:   . Sexually Abused:     FH:  Family History  Problem Relation Age of Onset  . Atrial fibrillation Mother   .  Hypertension Mother   . Stroke Mother   . Diabetes Mother   . Diabetes Father   . Hypertension Father   . Diabetes Sister   . Hypertension Sister   . Diabetes Brother   . Hypertension Brother   . Diabetes Brother   . Heart attack Brother     Past Medical History:  Diagnosis Date  . Atrial fibrillation with RVR ([place])   . Bigeminy 01/2020  . CHF (congestive heart failure) ([place]) 10/20/2019  . CKD (chronic kidney disease), stage IV ([place])   . Diabetes mellitus without complication ([place])   . DOE (dyspnea on exertion)    walking upstairs or up hill resolves in one minute  . Dyspnea    with activity  . Fibroids   . History of kidney stones   . History of recent blood transfusion 02/26/2020  . Hypertension   . Iron deficiency anemia   . Non-ischemic cardiomyopathy (HCC)    tachycardia induced  . Obese   . Peripheral edema   . Premature ventricular contractions (PVCs) (VPCs)   . Umbilical hernia   . Umbilical hernia   . Wears glasses     Current Outpatient Medications  Medication Sig Dispense Refill  . amLODipine (NORVASC) 5 MG tablet Take 1 tablet (5 mg total) by mouth daily. 30 tablet 6  . apixaban (ELIQUIS) 5 MG TABS tablet Take 1 tablet (5 mg total) by mouth 2 (two) times daily. Resume when bleeding has stopped in your urine. 60 tablet 6  . carvedilol (COREG) 25 MG tablet Take 1 tablet (25 mg total) by mouth 2 (two) times daily with a meal. 180 tablet 3  . ferrous sulfate 325 (65 FE) MG tablet Take 1 tablet (325 mg total) by mouth daily with breakfast. 30 tablet 3  . glucose blood test strip Use as instructed 100 each 12  . hydrALAZINE (APRESOLINE) 100 MG tablet Take 1 tablet (100 mg total) by mouth every 8 (eight) hours. 90 tablet 6  . insulin glargine (LANTUS) 100 UNIT/ML injection Inject 15 Units into the skin every morning.     . isosorbide mononitrate (IMDUR) 60 MG 24 hr tablet Take 1 tablet (60 mg total) by mouth daily. 30 tablet 6  . Lancets (ONETOUCH ULTRASOFT)  lancets Check CBG twice a day 60 each 5  . losartan (COZAAR) 50 MG tablet Take 1 tablet (50 mg total) by mouth daily. 90 tablet 3  . metolazone (ZAROXOLYN) 5 MG tablet Take only as directed by CHF clinic (Patient taking differently: Take 10 mg by mouth See admin instructions. Per CHF Clinic take 10mg  by mouth on 6.3.21 and 6.4.21.) 10 tablet 0  . mexiletine (MEXITIL) 150 MG capsule Take 2 capsules (300 mg total) by mouth every 12 (twelve) hours. 120 capsule 6  . Multiple Vitamins-Minerals (WOMENS MULTI PO) Take 1 tablet by mouth daily.    . potassium chloride (KLOR-CON) 10  MEQ tablet Take 4 tablets (40 mEq total) by mouth daily. 120 tablet 6  . senna-docusate (SENOKOT-S) 8.6-50 MG tablet Take 1 tablet by mouth at bedtime. 100 tablet 0  . torsemide (DEMADEX) 20 MG tablet Take 2 tablets (40 mg total) by mouth 2 (two) times daily. 120 tablet 6  . traMADol (ULTRAM) 50 MG tablet Take 1-2 tablets (50-100 mg total) by mouth every 6 (six) hours as needed for moderate pain. 15 tablet 0   No current facility-administered medications for this encounter.    Vitals:   06/02/20 1136  BP: (!) 160/78  Pulse: 89  SpO2: 99%  Weight: 107 kg (236 lb)   Wt Readings from Last 3 Encounters:  06/02/20 107 kg (236 lb)  05/23/20 102.1 kg (225 lb)  05/20/20 105.2 kg (231 lb 14.4 oz)    PHYSICAL EXAM: General:  Well appearing. No resp difficulty HEENT: normal Neck: supple. no JVD. Carotids 2+ bilat; no bruits. No lymphadenopathy or thryomegaly appreciated. Cor: PMI nondisplaced. Irregular rate & rhythm. No rubs, gallops or murmurs. Lungs: clear Abdomen: obese soft, nontender, nondistended. No hepatosplenomegaly. No bruits or masses. Good bowel sounds. Extremities: no cyanosis, clubbing, rash, trace edema Neuro: alert & orientedx3, cranial nerves grossly intact. moves all 4 extremities w/o difficulty. Affect pleasant  EKG: SR with frequent monomorphic PVCs 80 bpm  Personally reviewed   ASSESSMENT &  PLAN: 1. Chronic  Systolic Heart Failure ECHO 10/2019  EF 20-25%, pericardial effusion, mild LVH, RA/LA dilated.  RV . Possible Tachy Mediated cardiomyopathy verus PVC. She has not had LHC due to elevated creatinine. Had [place] 11/23 with preserved cardiac output.  - Echo 2/21 EF 40-45% (Hard to assess with PVCs) RV mild HK. No effusion Personally reviewed - Improved NYHA II - Volume status ok   - Continue torsemide 40 mg daily.  - Continue carvedilol 25 bid - Has not been on spiro/dig/arb with CKD IIB-IV. Recent creatinine 1.88 - Start Farxiga 10 for HF and renal protectionj - If creatinine remains < 2.0 consider ARNI at next visit - Continue hydralazine 100 mg three times a day + imdur 60 mg daily.  - Awaiting EP input on further direction regarding PVC management, Will repeat echo and Zio  2. Frequent PVCs  - Failed amio - On mexilitene but does not seem to be owrking -  Zio Patch 11/2019 Frequent PVCs (19.0%, 42278) with two predominant morphologies (9.8 & 9.3%, respectively). Saw Dr. Lovena Le who was considering PVC ablation. Doubted mexilitene is helping much. - We discussed and we both favor attempt at [facility] ablation when other medical issues sorted out. Has f/u with Dr. Lovena Le soon, Will repeat echo and zio prior to visit  3. PAF  - Maintaining NSR.  - Continue carvedilol  - Continue eliquis 5 mg twice a day.  4. CKD 3b - recent creatinine 1.88 (down from 2.97)  - Follows with Nephrology  5. HTN  -  High here but well controlled at home  6. H/O AKI with staghorn calculi - Recent admit peaked at 3.4 - She is s/p nephrolithotomy - Creatinine down 2.97 -> 2.33 -> 1.82 - Check BMET today    7. Uncontrolled DM - Hgb A1C 11.7  - Adding Farxiga  8. Uterine Mass - Following with Gyn. Planning for resection - She is at moderate risk for peri-op CV complications but OK to proceed. Will await echo results  9. Possible OSA with snoring - needs sleep study - complicated by lack  of health  insurance - can order when she gets Medicaid - Will order now  [name] MD  11:58 AM

## 2020-06-02 NOTE — Progress Notes (Signed)
Zio patch placed onto patient.  All instructions and information reviewed with patient, they verbalize understanding with no questions. Pt is aware to call company and ask for financial assistance.   Pt provided 30 day free card for [place], pt assistance forms completed and signed by pt, will send into [employer]

## 2020-06-02 NOTE — Patient Instructions (Addendum)
Start Farxiga 10 mg daily  Your physician has requested that you have an echocardiogram. Echocardiography is a painless test that uses sound waves to create images of your heart. It provides your doctor with information about the size and shape of your heart and how well your heart's chambers and valves are working. This procedure takes approximately one hour. There are no restrictions for this procedure.  Your provider has recommended that  you wear a Zio Patch for 14 days.  This monitor will record your heart rhythm for our review.  IF you have any symptoms while wearing the monitor please press the button.  If you have any issues with the patch or you notice a red or orange light on it please call the company at [phone].  Once you remove the patch please mail it back to the company as soon as possible so we can get the results.  Please call our office in October to schedule your follow up appointment  If you have any questions or concerns before your next appointment please send [place] a message through [place] or call our office at [phone].    TO LEAVE A MESSAGE FOR THE NURSE SELECT OPTION 2, PLEASE LEAVE A MESSAGE INCLUDING: . YOUR NAME . DATE OF BIRTH . CALL BACK NUMBER . REASON FOR CALL**this is important as we prioritize the call backs  [place] AS LONG AS YOU CALL BEFORE 4:00 PM  At the [facility], you and your health needs are our priority. As part of our continuing mission to provide you with exceptional heart care, we have created designated Provider Care Teams. These Care Teams include your primary Cardiologist (physician) and Advanced Practice Providers (APPs- Physician Assistants and Nurse Practitioners) who all work together to provide you with the care you need, when you need it.   You may see any of the following providers on your designated Care Team at your next follow up: Marland Kitchen Dr [name] . Dr Loralie Champagne . Darrick Grinder, NP . Lyda Jester, PA . Audry Riles, PharmD   Please be sure to bring in all your medications bottles to every appointment.

## 2020-06-04 ENCOUNTER — Telehealth (HOSPITAL_COMMUNITY): Payer: Self-pay | Admitting: Pharmacy Technician

## 2020-06-04 NOTE — Progress Notes (Signed)
Patient notified of results & recommendations. Expressed understanding.

## 2020-06-04 NOTE — Telephone Encounter (Signed)
Sent [employer] application via fax.  Will follow up.

## 2020-06-06 NOTE — Telephone Encounter (Signed)
Pt called stating she called zio customer service because the light on her zio was flashing. They tried troubleshooting but the light stayed on. She was advised to send in her device. Pt wanted to make sure that was ok with our office before she sent it in. I advised pt to send her zio to the company.

## 2020-06-10 ENCOUNTER — Other Ambulatory Visit: Payer: Self-pay | Admitting: Family Medicine

## 2020-06-10 ENCOUNTER — Other Ambulatory Visit: Payer: Self-pay

## 2020-06-10 ENCOUNTER — Ambulatory Visit: Payer: Self-pay | Attending: Internal Medicine | Admitting: Pharmacist

## 2020-06-10 DIAGNOSIS — E1165 Type 2 diabetes mellitus with hyperglycemia: Secondary | ICD-10-CM

## 2020-06-10 DIAGNOSIS — Z794 Long term (current) use of insulin: Secondary | ICD-10-CM

## 2020-06-10 MED ORDER — METOLAZONE 5 MG PO TABS: ORAL_TABLET | ORAL | 0 refills | Status: DC

## 2020-06-10 MED ORDER — INSULIN GLARGINE 100 UNIT/ML ~~LOC~~ SOLN: 20.0000 [IU] | SUBCUTANEOUS | 2 refills | Status: DC

## 2020-06-10 MED FILL — TORSEMIDE 20 MG TABLET: 20 | 30 days supply | Qty: 120 | Fill #4

## 2020-06-10 MED FILL — metOLazone 5 MG TABS: 5 | 30 days supply | Qty: 10 | Fill #0

## 2020-06-10 NOTE — Progress Notes (Signed)
° ° °  S:    PCP: Dr. Juleen China   No chief complaint on file.  Patient arrives in good spirits.  Presents for diabetes evaluation, education, and management Patient was referred and last seen by Primary Care Provider on 05/28/2020.  She was instructed to titrate her insulin daily to achieve fasting goals.   Family/Social History:  - FHx: DM, HTN, stroke, A fib, MI - Tobacco:  Never smoker - Alcohol:  Denies use   Insurance coverage/medication affordability: none   Medication adherence reported.   Current diabetes medications include: Farxiga 10 mg daily, Lantus 20 units daily  Patient denies hypoglycemic events.  Patient reported dietary habits:  - Reports limiting carbs and sweets  - Has decreased intake of fruit juices since seeing Dr. Juleen China   Patient-reported exercise habits:  - walks daily; endorses DUE when climbing stairs or walking up inclines    Patient denies nocturia (nighttime urination).  Patient denies neuropathy (nerve pain). Patient denies visual changes. Patient reports self foot exams.     O:  POCT: 128  Lab Results  Component Value Date   HGBA1C 8.1 (A) 05/12/2020   There were no vitals filed for this visit.  Lipid Panel     Component Value Date/Time   CHOL 122 10/26/2019 1020   TRIG 57 10/26/2019 1020   HDL 49 10/26/2019 1020   CHOLHDL 2.5 10/26/2019 1020   VLDL 11 10/26/2019 1020   LDLCALC 62 10/26/2019 1020   Home fasting blood sugars: 120s-130  2 hour post-meal/random blood sugars: none   Clinical Atherosclerotic Cardiovascular Disease (ASCVD): No  The ASCVD Risk score Mikey Bussing DC Jr., et al., 2013) failed to calculate for the following reasons:   The valid total cholesterol range is 130 to 320 mg/dL    A/P: Diabetes longstanding currently uncontrolled d/t A1c, however, home sugars reveal improvement.. Patient is able to verbalize appropriate hypoglycemia management plan. Medication adherence reported. -Continued current regimen.    -Extensively discussed pathophysiology of diabetes, recommended lifestyle interventions, dietary effects on blood sugar control -Counseled on s/sx of and management of hypoglycemia -Next A1C anticipated 07/2020.  Written patient instructions provided.  Total time in face to face counseling 15 minutes.   Follow up Pharmacist Clinic Visit in 1 month.    Benard Halsted, PharmD, [place] [phone]

## 2020-06-11 MED FILL — LANTUS 100 UNITS/ML VIAL: 100 | 28 days supply | Qty: 10 | Fill #0

## 2020-06-11 MED FILL — MEXILETINE 150 MG CAPSULE: 150 | 30 days supply | Qty: 120 | Fill #4

## 2020-06-16 ENCOUNTER — Ambulatory Visit (HOSPITAL_COMMUNITY)
Admission: RE | Admit: 2020-06-16 | Discharge: 2020-06-16 | Disposition: A | Discharge status: Home / Self Care | Payer: Self-pay | Source: Ambulatory Visit | Attending: Internal Medicine | Admitting: Internal Medicine

## 2020-06-16 ENCOUNTER — Other Ambulatory Visit: Payer: Self-pay

## 2020-06-16 DIAGNOSIS — E1122 Type 2 diabetes mellitus with diabetic chronic kidney disease: Secondary | ICD-10-CM | POA: Insufficient documentation

## 2020-06-16 DIAGNOSIS — I081 Rheumatic disorders of both mitral and tricuspid valves: Secondary | ICD-10-CM | POA: Insufficient documentation

## 2020-06-16 DIAGNOSIS — N189 Chronic kidney disease, unspecified: Secondary | ICD-10-CM | POA: Insufficient documentation

## 2020-06-16 DIAGNOSIS — I428 Other cardiomyopathies: Secondary | ICD-10-CM

## 2020-06-16 DIAGNOSIS — I4891 Unspecified atrial fibrillation: Secondary | ICD-10-CM | POA: Insufficient documentation

## 2020-06-16 DIAGNOSIS — I493 Ventricular premature depolarization: Secondary | ICD-10-CM | POA: Insufficient documentation

## 2020-06-16 DIAGNOSIS — I129 Hypertensive chronic kidney disease with stage 1 through stage 4 chronic kidney disease, or unspecified chronic kidney disease: Secondary | ICD-10-CM | POA: Insufficient documentation

## 2020-06-16 NOTE — Progress Notes (Signed)
  Echocardiogram 2D Echocardiogram has been performed.  Adrienne Lane M 06/16/2020, 10:56 AM

## 2020-06-30 ENCOUNTER — Telehealth: Payer: Self-pay

## 2020-06-30 NOTE — Telephone Encounter (Signed)

## 2020-07-01 ENCOUNTER — Ambulatory Visit (INDEPENDENT_AMBULATORY_CARE_PROVIDER_SITE_OTHER): Payer: Self-pay | Admitting: Internal Medicine

## 2020-07-01 ENCOUNTER — Ambulatory Visit (HOSPITAL_COMMUNITY)
Admission: RE | Admit: 2020-07-01 | Discharge: 2020-07-01 | Disposition: A | Discharge status: Home / Self Care | Payer: Self-pay | Source: Ambulatory Visit | Attending: Internal Medicine | Admitting: Internal Medicine

## 2020-07-01 ENCOUNTER — Other Ambulatory Visit: Payer: Self-pay

## 2020-07-01 VITALS — BP 94/52 | HR 65 | Temp 97.2°F | Resp 17 | Wt 229.0 lb

## 2020-07-01 DIAGNOSIS — R600 Localized edema: Secondary | ICD-10-CM | POA: Insufficient documentation

## 2020-07-01 NOTE — Patient Instructions (Signed)
Your DVT ultrasound has been scheduled at [facility] on 07/01/2020 at 12:45 PM.

## 2020-07-01 NOTE — Addendum Note (Signed)
Encounter addended by: Micki Riley, RN on: 07/01/2020 10:55 AM  Actions taken: Imaging Exam ended

## 2020-07-01 NOTE — Progress Notes (Signed)
Subjective:    Adrienne Lane - 54 y.o. female MRN [id number]  Date of birth: January 28, 1966  HPI  Adrienne Lane is here for left lower leg edema. She bumped her shin on the tub and has a bump that has not gone down since about 1.5-2 weeks ago. However, she has had edema for about one month. Reports leg was previously about 2x as big. No edema of right leg. Denies erythema, rashes, skin color changes of the left leg. Did have pain in the back of her left leg that was very tender but then went away after a few days. Has had multiple surgeries this spring related to nephrolithiasis and hydronephrosis. Reports that after surgeries she was up and moving around. No personal history of breast cancer, reproductive cancers, no other cancers. Has a large fibroid uterus which has caused obstructive uropathy and hydronephrosis.  She is currently on Eliquis for Afib. Reports compliance with this medication. No prior history of VTE.      Health Maintenance:  Health Maintenance Due  Topic Date Due  . Hepatitis C Screening  Never done  . COVID-19 Vaccine (1) Never done    -  reports that she has never smoked. She has never used smokeless tobacco. - Review of Systems: Per HPI. - Past Medical History: Patient Active Problem List   Diagnosis Date Noted  . AKI (acute kidney injury) ([place]) 05/23/2020  . Chronic a-fib ([place]) 05/23/2020  . Hydroureter on right 05/23/2020  . Microalbuminuria 05/13/2020  . Non-ischemic cardiomyopathy ([place])   . Normocytic anemia   . Nephrolithiasis 02/14/2020  . Paroxysmal atrial fibrillation ([place]) 12/18/2019  . 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 Gross hematuria 12/18/2019  . Staghorn renal calculus 12/18/2019  . Chronic kidney disease (CKD), stage IV (severe) ([place]) 12/18/2019  . Umbilical hernia without obstruction and without gangrene 12/18/2019  . Uterine fibroid 10/25/2019  . Right hip pain   . Type 2 diabetes mellitus ([place])   . Essential hypertension   . CHF (congestive  heart failure) ([place]) 10/21/2019  . Atrial fibrillation with RVR ([place]) 10/21/2019  . Atrial fibrillation with rapid ventricular response ([place]) 10/20/2019   - Medications: reviewed and updated   Objective:   Physical Exam BP (!) 94/52   Pulse 65   Temp (!) 97.2 F (36.2 C) (Temporal)   Resp 17   Wt 229 lb (103.9 kg)   SpO2 96%   BMI 34.82 kg/m  Physical Exam Constitutional:      General: She is not in acute distress.    Appearance: She is not diaphoretic.  HENT:     Head: Normocephalic and atraumatic.  Eyes:     Conjunctiva/sclera: Conjunctivae normal.  Cardiovascular:     Rate and Rhythm: Normal rate and regular rhythm.     Heart sounds: Normal heart sounds. No murmur heard.      Comments: Negative Homan's test bilaterally.  Pulmonary:     Effort: Pulmonary effort is normal. No respiratory distress.     Breath sounds: Normal breath sounds.  Musculoskeletal:        General: Normal range of motion.     Left lower leg: Edema present.     Comments: Left lower extremity edema to distal portion of thigh, pitting, 2-3+. No palpable cords in left calf. No pain with palpation.   Skin:    General: Skin is warm and dry.  Neurological:     Mental Status: She is alert and oriented to person, place, and time.  Psychiatric:  Mood and Affect: Affect normal.        Judgment: Judgment normal.            Assessment & Plan:   1. Edema of left lower extremity Given significant unilateral edema with history of multiple recent surgeries, am concerned about possibility of DVT. This is less likely given she is anticoagulated but certainly could have had failure of medication. Would also question vascular congestion related to large uterine fibroids that have caused obstructive uropathy. CT abdomen/pelvis did not comment on any evidence of vascular compression. Patient is having hysterectomy in early August. If DVT sono neg, will likely wait to see if edema resolves post operatively  with removal of uterus. This could also be benign dependent edema. Does have impaired kidney function, thought to be postrenal in nature. No other lab abnormalities that would be concerning for etiology and would expect bilateral edema as presentation. Amlodipine could also be culprit but again would anticipate bilateral lower extremity edema. If does not resolve post hysterectomy, would consider obtaining further vascular imaging of the pelvic region.  - VAS [place] LOWER EXTREMITY VENOUS (DVT); Future     Phill Myron, D.O. 07/11/2020, 2:24 PM Primary Care at [facility]

## 2020-07-01 NOTE — Progress Notes (Signed)
Left lower extremity venous duplex completed. Refer to "CV Proc" under chart review to view preliminary results.  07/01/2020 2:10 PM Kelby Aline., MHA, RVT, RDCS, RDMS

## 2020-07-10 ENCOUNTER — Ambulatory Visit: Payer: Self-pay | Attending: Family Medicine | Admitting: Pharmacist

## 2020-07-10 ENCOUNTER — Encounter: Payer: Self-pay | Admitting: Pharmacist

## 2020-07-10 ENCOUNTER — Other Ambulatory Visit: Payer: Self-pay

## 2020-07-10 DIAGNOSIS — E1165 Type 2 diabetes mellitus with hyperglycemia: Secondary | ICD-10-CM

## 2020-07-10 DIAGNOSIS — Z794 Long term (current) use of insulin: Secondary | ICD-10-CM

## 2020-07-10 LAB — GLUCOSE, POCT (MANUAL RESULT ENTRY): POC Glucose: 150 mg/dl — AB (ref 70–99)

## 2020-07-10 MED ORDER — AMLODIPINE BESYLATE 5 MG PO TABS
10.0000 mg | ORAL_TABLET | Freq: Every day | ORAL | 2 refills | Status: DC
Start: 2020-07-10 — End: 2020-07-10

## 2020-07-10 MED ORDER — AMLODIPINE BESYLATE 5 MG PO TABS: 5.0000 mg | ORAL_TABLET | Freq: Every day | ORAL | 6 refills | Status: DC

## 2020-07-10 MED FILL — ?AMLODIPINE BESYLATE 5MG TA: 5 | 30 days supply | Qty: 30 | Fill #0

## 2020-07-10 NOTE — Progress Notes (Signed)
° ° °  S:    PCP: Dr. Juleen China   No chief complaint on file.  Patient arrives in good spirits.  Presents for diabetes evaluation, education, and management Patient was referred and last seen by Primary Care Provider on 05/28/2020. I saw her 06/10/2020.  Family/Social History:  - FHx: DM, HTN, stroke, A fib, MI - Tobacco:  Never smoker - Alcohol:  Denies use   Insurance coverage/medication affordability: none   Medication adherence reported.   Current diabetes medications include: Farxiga 10 mg daily, Lantus 20 units daily  Patient denies hypoglycemic events.  Patient reported dietary habits:  - Reports limiting carbs and sweets  - Has decreased intake of fruit juices since seeing Dr. Juleen China   Patient-reported exercise habits:  - walks daily; endorses DUE when climbing stairs or walking up inclines    Patient denies nocturia (nighttime urination).  Patient denies neuropathy (nerve pain). Patient denies visual changes. Patient reports self foot exams.     O:  POCT: 150 (ate a peach just before her appt today)  Lab Results  Component Value Date   HGBA1C 8.1 (A) 05/12/2020   There were no vitals filed for this visit.  Lipid Panel     Component Value Date/Time   CHOL 122 10/26/2019 1020   TRIG 57 10/26/2019 1020   HDL 49 10/26/2019 1020   CHOLHDL 2.5 10/26/2019 1020   VLDL 11 10/26/2019 1020   LDLCALC 62 10/26/2019 1020   Home fasting blood sugars: 100s - 120s 2 hour post-meal/random blood sugars: none   Clinical Atherosclerotic Cardiovascular Disease (ASCVD): No  The ASCVD Risk score Mikey Bussing DC Jr., et al., 2013) failed to calculate for the following reasons:   The valid total cholesterol range is 130 to 320 mg/dL    A/P: Diabetes longstanding currently uncontrolled d/t A1c, however, home sugars reveal improvement. Patient is able to verbalize appropriate hypoglycemia management plan. Medication adherence reported. -Continued current regimen.  -Extensively  discussed pathophysiology of diabetes, recommended lifestyle interventions, dietary effects on blood sugar control -Counseled on s/sx of and management of hypoglycemia -Next A1C anticipated 07/2020.  Written patient instructions provided.  Total time in face to face counseling 15 minutes.   Follow up PCP Clinic Visit in August.    Benard Halsted, PharmD, [place] [phone]

## 2020-07-15 ENCOUNTER — Other Ambulatory Visit: Payer: Self-pay | Admitting: Obstetrics & Gynecology

## 2020-07-15 NOTE — Progress Notes (Signed)
[place], [place] [address] [place] [address] Phone: [phone] Fax: [phone]  [facility] at [place], [place] - [place] [address]  [place] [address] Phone: [phone] Fax: [phone]      Your procedure is scheduled on Monday August 2  Report to [facility] Main Entrance "A" at 1015 A.M., and check in at the Admitting office.  Call this number if you have problems the morning of surgery:  [phone]  Call [phone] if you have any questions prior to your surgery date Monday-Friday 8am-4pm    Remember:  Do not eat after midnight the night before your surgery  You may drink clear liquids until 0915 am the morning of your surgery.   Clear liquids allowed are: Water, Non-Citrus Juices (without pulp), Carbonated Beverages, Clear Tea, Black Coffee Only, and Gatorade    Take these medicines the morning of surgery with A SIP OF WATER  amLODipine (NORVASC) carvedilol (COREG) hydrALAZINE (APRESOLINE)  isosorbide mononitrate (IMDUR) traMADol (ULTRAM) if needed for pain   Follow your surgeon's instructions on when to stop apixaban (ELIQUIS).  If no instructions were given by your surgeon then you will need to call the office to get those instructions.    As of today, STOP taking any Aspirin (unless otherwise instructed by your surgeon) Aleve, Naproxen, Ibuprofen, Motrin, Advil, Goody's, BC's, all herbal medications, fish oil, and all vitamins.   WHAT DO I DO ABOUT MY DIABETES MEDICATION?   Marland Kitchen Do not take oral diabetes medicines (pills) the morning of surgery. dapagliflozin propanediol (FARXIGA     . THE MORNING OF SURGERY, take ______10_______ units of ___insulin glargine (LANTUS) _______insulin.  . The day of surgery, do not take other diabetes injectables, including Byetta (exenatide), Bydureon (exenatide ER), Victoza (liraglutide), or Trulicity  (dulaglutide).  . If your CBG is greater than 220 mg/dL, you may take  of your sliding scale (correction) dose of insulin.   HOW TO MANAGE YOUR DIABETES BEFORE AND AFTER SURGERY  Why is it important to control my blood sugar before and after surgery? . Improving blood sugar levels before and after surgery helps healing and can limit problems. . A way of improving blood sugar control is eating a healthy diet by: o  Eating less sugar and carbohydrates o  Increasing activity/exercise o  Talking with your doctor about reaching your blood sugar goals . High blood sugars (greater than 180 mg/dL) can raise your risk of infections and slow your recovery, so you will need to focus on controlling your diabetes during the weeks before surgery. . Make sure that the doctor who takes care of your diabetes knows about your planned surgery including the date and location.  How do I manage my blood sugar before surgery? . Check your blood sugar at least 4 times a day, starting 2 days before surgery, to make sure that the level is not too high or low. . Check your blood sugar the morning of your surgery when you wake up and every 2 hours until you get to the Short Stay unit. o If your blood sugar is less than 70 mg/dL, you will need to treat for low blood sugar: - Do not take insulin. - Treat a low blood sugar (less than 70 mg/dL) with  cup of clear juice (cranberry or apple), 4 glucose tablets, OR glucose gel. - Recheck blood sugar in 15 minutes after treatment (  to make sure it is greater than 70 mg/dL). If your blood sugar is not greater than 70 mg/dL on recheck, call [phone] for further instructions. . Report your blood sugar to the short stay nurse when you get to Short Stay.  . If you are admitted to the hospital after surgery: o Your blood sugar will be checked by the staff and you will probably be given insulin after surgery (instead of oral diabetes medicines) to make sure you have good blood  sugar levels. o The goal for blood sugar control after surgery is 80-180 mg/dL.                   Do not wear jewelry, make up, or nail polish            Do not wear lotions, powders, perfumes/colognes, or deodorant.            Do not shave 48 hours prior to surgery.  Men may shave face and neck.            Do not bring valuables to the hospital.            [facility] is not responsible for any belongings or valuables.  Do NOT Smoke (Tobacco/Vaping) or drink Alcohol 24 hours prior to your procedure If you use a CPAP at night, you may bring all equipment for your overnight stay.   Contacts, glasses, dentures or bridgework may not be worn into surgery.      For patients admitted to the hospital, discharge time will be determined by your treatment team.   Patients discharged the day of surgery will not be allowed to drive home, and someone needs to stay with them for 24 hours.    Special instructions:   [place]- Preparing For Surgery  Before surgery, you can play an important role. Because skin is not sterile, your skin needs to be as free of germs as possible. You can reduce the number of germs on your skin by washing with CHG (chlorahexidine gluconate) Soap before surgery.  CHG is an antiseptic cleaner which kills germs and bonds with the skin to continue killing germs even after washing.    Oral Hygiene is also important to reduce your risk of infection.  Remember - BRUSH YOUR TEETH THE MORNING OF SURGERY WITH YOUR REGULAR TOOTHPASTE  Please do not use if you have an allergy to CHG or antibacterial soaps. If your skin becomes reddened/irritated stop using the CHG.  Do not shave (including legs and underarms) for at least 48 hours prior to first CHG shower. It is OK to shave your face.  Please follow these instructions carefully.   1. Shower the NIGHT BEFORE SURGERY and the MORNING OF SURGERY with CHG Soap.   2. If you chose to wash your hair, wash your hair first as usual with  your normal shampoo.  3. After you shampoo, rinse your hair and body thoroughly to remove the shampoo.  4. Use CHG as you would any other liquid soap. You can apply CHG directly to the skin and wash gently with a scrungie or a clean washcloth.   5. Apply the CHG Soap to your body ONLY FROM THE NECK DOWN.  Do not use on open wounds or open sores. Avoid contact with your eyes, ears, mouth and genitals (private parts). Wash Face and genitals (private parts)  with your normal soap.   6. Wash thoroughly, paying special attention to the area where your surgery  will be performed.  7. Thoroughly rinse your body with warm water from the neck down.  8. DO NOT shower/wash with your normal soap after using and rinsing off the CHG Soap.  9. Pat yourself dry with a CLEAN TOWEL.  10. Wear CLEAN PAJAMAS to bed the night before surgery  11. Place CLEAN SHEETS on your bed the night of your first shower and DO NOT SLEEP WITH PETS.   Day of Surgery: Wear Clean/Comfortable clothing the morning of surgery Do not apply any deodorants/lotions.   Remember to brush your teeth WITH YOUR REGULAR TOOTHPASTE.   Please read over the following fact sheets that you were given.

## 2020-07-16 ENCOUNTER — Other Ambulatory Visit: Payer: Self-pay

## 2020-07-16 ENCOUNTER — Encounter (HOSPITAL_COMMUNITY): Payer: Self-pay

## 2020-07-16 ENCOUNTER — Encounter (HOSPITAL_COMMUNITY)
Admission: RE | Admit: 2020-07-16 | Discharge: 2020-07-16 | Disposition: A | Discharge status: Home / Self Care | Payer: Self-pay | Source: Ambulatory Visit | Attending: Obstetrics & Gynecology | Admitting: Obstetrics & Gynecology

## 2020-07-16 DIAGNOSIS — Z01812 Encounter for preprocedural laboratory examination: Secondary | ICD-10-CM | POA: Insufficient documentation

## 2020-07-16 LAB — BASIC METABOLIC PANEL
Anion gap: 9 (ref 5–15)
BUN: 21 mg/dL — ABNORMAL HIGH (ref 6–20)
CO2: 27 mmol/L (ref 22–32)
Calcium: 8.7 mg/dL — ABNORMAL LOW (ref 8.9–10.3)
Chloride: 104 mmol/L (ref 98–111)
Creatinine, Ser: 1.77 mg/dL — ABNORMAL HIGH (ref 0.44–1.00)
GFR calc Af Amer: 38 mL/min — ABNORMAL LOW (ref >60)
GFR calc non Af Amer: 32 mL/min — ABNORMAL LOW (ref >60)
Glucose, Bld: 268 mg/dL — ABNORMAL HIGH (ref 70–99)
Potassium: 3.2 mmol/L — ABNORMAL LOW (ref 3.5–5.1)
Sodium: 140 mmol/L (ref 135–145)

## 2020-07-16 LAB — TYPE AND SCREEN
ABO/RH(D): O POS
Antibody Screen: NEGATIVE

## 2020-07-16 LAB — CBC
HCT: 30.2 % — ABNORMAL LOW (ref 36.0–46.0)
Hemoglobin: 9.5 g/dL — ABNORMAL LOW (ref 12.0–15.0)
MCH: 27.4 pg (ref 26.0–34.0)
MCHC: 31.5 g/dL (ref 30.0–36.0)
MCV: 87 fL (ref 80.0–100.0)
Platelets: 223 10*3/uL (ref 150–400)
RBC: 3.47 MIL/uL — ABNORMAL LOW (ref 3.87–5.11)
RDW: 13.6 % (ref 11.5–15.5)
WBC: 3.9 10*3/uL — ABNORMAL LOW (ref 4.0–10.5)
nRBC: 0 % (ref 0.0–0.2)

## 2020-07-16 LAB — GLUCOSE, CAPILLARY: Glucose-Capillary: 241 mg/dL — ABNORMAL HIGH (ref 70–99)

## 2020-07-16 LAB — HEMOGLOBIN A1C
Hgb A1c MFr Bld: 7 % — ABNORMAL HIGH (ref 4.8–5.6)
Mean Plasma Glucose: 154.2 mg/dL

## 2020-07-16 NOTE — Progress Notes (Signed)
°   07/16/20 0937  OBSTRUCTIVE SLEEP APNEA  Have you ever been diagnosed with sleep apnea through a sleep study? No  Do you snore loudly (loud enough to be heard through closed doors)?  1  Do you often feel tired, fatigued, or sleepy during the daytime (such as falling asleep during driving or talking to someone)? 1  Has anyone observed you stop breathing during your sleep? 1  Do you have, or are you being treated for high blood pressure? 1  BMI more than 35 kg/m2? 0  Age > 50 (1-yes) 1  Neck circumference greater than:Female 16 inches or larger, Female 17inches or larger? 0  Female Gender (Yes=1) 0  Obstructive Sleep Apnea Score 5  Score 5 or greater  Results sent to PCP

## 2020-07-16 NOTE — Progress Notes (Signed)
PCP - Dr. Nicolette Bang Cardiologist - Dr. [name] Urologist - Dr. Jacalyn Lefevre  PPM/ICD - denies   Chest x-ray - N/A EKG - 06/02/2020 Stress Test - denies ECHO - 06/16/2020 Cardiac Cath - 11/09/2019  Sleep Study - denies CPAP - N/A STOP BANG score 5: results routed to PCP  Fasting Blood Sugar - 100 - 120 Checks Blood Sugar 1 time/day  Blood Thinner Instructions: ELIQUIS: per patient last dose 07/14/2020 Aspirin Instructions: N/A  ERAS Protcol - Yes PRE-SURGERY Ensure or G2- None ordered  COVID TEST- Scheduled for 07/18/2020. Patient verbalized understanding of self-quarantine instructions, appointment time and place.  Anesthesia review: YES, cardiac hx  Patient denies shortness of breath, fever, cough and chest pain at PAT appointment  All instructions explained to the patient, with a verbal understanding of the material. Patient agrees to go over the instructions while at home for a better understanding. Patient also instructed to self quarantine after being tested for COVID-19. The opportunity to ask questions was provided.

## 2020-07-17 NOTE — Anesthesia Preprocedure Evaluation (Addendum)
Anesthesia Evaluation  Patient identified by MRN, date of birth, ID band Patient awake    Reviewed: Allergy & Precautions, NPO status , Patient's Chart, lab work & pertinent test results, reviewed documented beta blocker date and time   History of Anesthesia Complications Negative for: history of anesthetic complications  Airway Mallampati: I  TM Distance: >3 FB Neck ROM: Full    Dental no notable dental hx.    Pulmonary neg pulmonary ROS,    Pulmonary exam normal        Cardiovascular hypertension, Pt. on medications and Pt. on home beta blockers +CHF  Normal cardiovascular exam+ dysrhythmias (on Eliquis) Atrial Fibrillation   TTE 05/2020: EF 50-55%, global hypokinesis, grade II diastolic dysfunction, moderately elevated PASP, RVSP 50.3 mmHg, moderate LAE, mild to moderate MR, mild to moderate TR     Neuro/Psych negative neurological ROS  negative psych ROS   GI/Hepatic negative GI ROS, Neg liver ROS,   Endo/Other  diabetes, Type 2, Insulin Dependent  Renal/GU Renal InsufficiencyRenal disease   Uterine fibroids    Musculoskeletal negative musculoskeletal ROS (+)   Abdominal   Peds  Hematology  (+) anemia , Hgb 9.5   Anesthesia Other Findings Day of surgery medications reviewed with patient.  Reproductive/Obstetrics negative OB ROS                           Anesthesia Physical Anesthesia Plan  ASA: III  Anesthesia Plan: General   Post-op Pain Management:    Induction: Intravenous  PONV Risk Score and Plan: 4 or greater and Scopolamine patch - Pre-op, Midazolam, Ondansetron, Dexamethasone and Treatment may vary due to age or medical condition  Airway Management Planned: Oral ETT  Additional Equipment: None  Intra-op Plan:   Post-operative Plan: Extubation in OR  Informed Consent: I have reviewed the patients History and Physical, chart, labs and discussed the procedure  including the risks, benefits and alternatives for the proposed anesthesia with the patient or authorized representative who has indicated his/her understanding and acceptance.     Dental advisory given  Plan Discussed with: CRNA  Anesthesia Plan Comments: (PAT note by Karoline Caldwell, PA-C: Follows with cardiology for history of HTN, PAF, frequent PVCs, and chronic systolic HF. ECHO 16/0109 EF 20-25%, pericardial effusion, mild LVH, RA/LA dilated. RV . Possible Tachy Mediated cardiomyopathy verus PVC. She has not had LHC due to elevated creatinine. Had [place] 11/23 with preserved cardiac output.  Echo 2/21 EF 40-45% (Per Dr. Clayborne Dana note, hard to assess with PVCs) RV mild HK. No effusion.  Last seen by Dr. [name] 06/02/2020, heart failure status improved NYHA II, volume status was good at that time maintaining sinus rhythm, continues to have frequent PVCs (EP is considering PVC ablation).  Upcoming surgery discussed, per note, "She is at moderate risk for peri-op CV complications but OK to proceed. Will await echo results."  Repeat echo 06/16/2020 showed EF 32-35%, grade 2 diastolic dysfunction, normal right ventricular systolic function, mild to moderate MR.  Admitted 6/21 with AKI on CKD. CT scan of the abdomen which showed bilateral hydronephrosis and large fibroid uterus. Creatinine on presentation was 3.24. Bilateral ureteral stent placement with bilateral retrograde pyelography. Creatinine 1.77 on preop labs which appears to be near her baseline.  Anemia on preop labs, hemoglobin 9.5.  IDDM2 reasonably well-controlled, A1c 7.0.  Patient reports last dose of Eliquis 07/14/2020.  EKG 06/02/2020: Sinus rhythm with frequent Premature ventricular complexes in a pattern of bigeminy.  Rate 80.  Event monitor 07/01/2020: Preliminary findings: Patient had a min HR of 56 bpm, max HR of 171 bpm, and AVG HR of 74 bpm.  Predominant underlying rhythm was sinus rhythm.  82 supraventricular tachycardia  runs occurred, the run with the fastest interval lasting 12 beats with a max rate of 171 bpm, the longest lasting 9 beats with an average rate of 110 bpm.  Isolated SVE's were occasional (2.9%, 2315), SVE couplets were rare (<1.0%, 398), and SVE triplets were rare (<1.0%, 137).  Isolated VE's were frequent (33.2%, [id number]), VE couplets were rare (<1.0%, 105), and VE triplets were rare (<1.0%, 1).  Ventricular bigeminy and trigeminy were present.  TTE 06/16/20: 1. Left ventricular ejection fraction, by estimation, is 50 to 55%. The  left ventricle has low normal function. The left ventricle demonstrates  global hypokinesis. Left ventricular diastolic parameters are consistent  with Grade II diastolic dysfunction  (pseudonormalization). The average left ventricular global longitudinal  strain is -9.5 %.  2. Right ventricular systolic function is normal. The right ventricular  size is normal. There is moderately elevated pulmonary artery systolic  pressure. The estimated right ventricular systolic pressure is 60.6 mmHg.  3. Left atrial size was moderately dilated.  4. The mitral valve is grossly normal. Mild to moderate mitral valve  regurgitation.  5. Tricuspid valve regurgitation is mild to moderate.  6. The aortic valve is normal in structure. Aortic valve regurgitation is  not visualized. No aortic stenosis is present. )      Anesthesia Quick Evaluation

## 2020-07-17 NOTE — Progress Notes (Signed)
Anesthesia Chart Review:  Follows with cardiology for history of HTN, PAF, frequent PVCs, and chronic systolic HF. ECHO 08/3234 EF 20-25%, pericardial effusion, mild LVH, RA/LA dilated. RV . Possible Tachy Mediated cardiomyopathy verus PVC. She has not had LHC due to elevated creatinine. Had [place] 11/23 with preserved cardiac output.  Echo 2/21 EF 40-45% (Per Dr. Clayborne Dana note, hard to assess with PVCs) RV mild HK. No effusion.  Last seen by Dr. [name] 06/02/2020, heart failure status improved NYHA II, volume status was good at that time maintaining sinus rhythm, continues to have frequent PVCs (EP is considering PVC ablation).  Upcoming surgery discussed, per note, "She is at moderate risk for peri-op CV complications but OK to proceed. Will await echo results."  Repeat echo 06/16/2020 showed EF 57-32%, grade 2 diastolic dysfunction, normal right ventricular systolic function, mild to moderate MR.  Admitted 6/21 with AKI on CKD. CT scan of the abdomen which showed bilateral hydronephrosis and large fibroid uterus. Creatinine on presentation was 3.24. Bilateral ureteral stent placement with bilateral retrograde pyelography. Creatinine 1.77 on preop labs which appears to be near her baseline.  Anemia on preop labs, hemoglobin 9.5.  IDDM2 reasonably well-controlled, A1c 7.0.  Patient reports last dose of Eliquis 07/14/2020.  EKG 06/02/2020: Sinus rhythm with frequent Premature ventricular complexes in a pattern of bigeminy.  Rate 80.  Event monitor 07/01/2020: Preliminary findings: Patient had a min HR of 56 bpm, max HR of 171 bpm, and AVG HR of 74 bpm.  Predominant underlying rhythm was sinus rhythm.  82 supraventricular tachycardia runs occurred, the run with the fastest interval lasting 12 beats with a max rate of 171 bpm, the longest lasting 9 beats with an average rate of 110 bpm.  Isolated SVE's were occasional (2.9%, 2315), SVE couplets were rare (<1.0%, 398), and SVE triplets were rare  (<1.0%, 137).  Isolated VE's were frequent (33.2%, [id number]), VE couplets were rare (<1.0%, 105), and VE triplets were rare (<1.0%, 1).  Ventricular bigeminy and trigeminy were present.  TTE 06/16/20: 1. Left ventricular ejection fraction, by estimation, is 50 to 55%. The  left ventricle has low normal function. The left ventricle demonstrates  global hypokinesis. Left ventricular diastolic parameters are consistent  with Grade II diastolic dysfunction  (pseudonormalization). The average left ventricular global longitudinal  strain is -9.5 %.  2. Right ventricular systolic function is normal. The right ventricular  size is normal. There is moderately elevated pulmonary artery systolic  pressure. The estimated right ventricular systolic pressure is 20.2 mmHg.  3. Left atrial size was moderately dilated.  4. The mitral valve is grossly normal. Mild to moderate mitral valve  regurgitation.  5. Tricuspid valve regurgitation is mild to moderate.  6. The aortic valve is normal in structure. Aortic valve regurgitation is  not visualized. No aortic stenosis is present.   Wynonia Musty [facility] Stay [facility]/Anesthesiology Phone [phone] 07/17/2020 11:24 AM

## 2020-07-18 ENCOUNTER — Other Ambulatory Visit (HOSPITAL_COMMUNITY)
Admission: RE | Admit: 2020-07-18 | Discharge: 2020-07-18 | Disposition: A | Discharge status: Home / Self Care | Payer: HRSA Program | Source: Ambulatory Visit | Attending: Obstetrics & Gynecology | Admitting: Obstetrics & Gynecology

## 2020-07-18 DIAGNOSIS — Z20822 Contact with and (suspected) exposure to covid-19: Secondary | ICD-10-CM | POA: Diagnosis not present

## 2020-07-18 DIAGNOSIS — Z01812 Encounter for preprocedural laboratory examination: Secondary | ICD-10-CM | POA: Insufficient documentation

## 2020-07-18 LAB — SARS CORONAVIRUS 2 (TAT 6-24 HRS): SARS Coronavirus 2: NEGATIVE

## 2020-07-21 ENCOUNTER — Other Ambulatory Visit: Payer: Self-pay

## 2020-07-21 ENCOUNTER — Encounter (HOSPITAL_COMMUNITY): Payer: Self-pay | Admitting: Obstetrics & Gynecology

## 2020-07-21 ENCOUNTER — Inpatient Hospital Stay (HOSPITAL_COMMUNITY): Payer: Self-pay | Admitting: Physician Assistant

## 2020-07-21 ENCOUNTER — Encounter (HOSPITAL_COMMUNITY)
Admission: RE | Disposition: A | Discharge status: Home / Self Care | Payer: Self-pay | Source: Home / Self Care | Attending: Obstetrics & Gynecology

## 2020-07-21 ENCOUNTER — Inpatient Hospital Stay (HOSPITAL_COMMUNITY)
Admission: RE | Admit: 2020-07-21 | Discharge: 2020-07-23 | DRG: 742 | Disposition: A | Discharge status: Home / Self Care | Payer: Self-pay | Attending: Obstetrics & Gynecology | Admitting: Obstetrics & Gynecology

## 2020-07-21 ENCOUNTER — Inpatient Hospital Stay (HOSPITAL_COMMUNITY): Payer: Self-pay | Admitting: Anesthesiology

## 2020-07-21 DIAGNOSIS — Z20822 Contact with and (suspected) exposure to covid-19: Secondary | ICD-10-CM | POA: Diagnosis present

## 2020-07-21 DIAGNOSIS — D219 Benign neoplasm of connective and other soft tissue, unspecified: Secondary | ICD-10-CM | POA: Diagnosis present

## 2020-07-21 DIAGNOSIS — Z7901 Long term (current) use of anticoagulants: Secondary | ICD-10-CM

## 2020-07-21 DIAGNOSIS — I428 Other cardiomyopathies: Secondary | ICD-10-CM | POA: Diagnosis present

## 2020-07-21 DIAGNOSIS — I130 Hypertensive heart and chronic kidney disease with heart failure and stage 1 through stage 4 chronic kidney disease, or unspecified chronic kidney disease: Secondary | ICD-10-CM | POA: Diagnosis present

## 2020-07-21 DIAGNOSIS — E1122 Type 2 diabetes mellitus with diabetic chronic kidney disease: Secondary | ICD-10-CM | POA: Diagnosis present

## 2020-07-21 DIAGNOSIS — I509 Heart failure, unspecified: Secondary | ICD-10-CM | POA: Diagnosis present

## 2020-07-21 DIAGNOSIS — Z90710 Acquired absence of both cervix and uterus: Secondary | ICD-10-CM | POA: Diagnosis present

## 2020-07-21 DIAGNOSIS — Z794 Long term (current) use of insulin: Secondary | ICD-10-CM

## 2020-07-21 DIAGNOSIS — N184 Chronic kidney disease, stage 4 (severe): Secondary | ICD-10-CM | POA: Diagnosis present

## 2020-07-21 DIAGNOSIS — I4891 Unspecified atrial fibrillation: Secondary | ICD-10-CM | POA: Diagnosis present

## 2020-07-21 DIAGNOSIS — D259 Leiomyoma of uterus, unspecified: Principal | ICD-10-CM | POA: Diagnosis present

## 2020-07-21 DIAGNOSIS — Z79899 Other long term (current) drug therapy: Secondary | ICD-10-CM

## 2020-07-21 HISTORY — PX: HYSTERECTOMY ABDOMINAL WITH SALPINGO-OOPHORECTOMY: SHX6792

## 2020-07-21 LAB — GLUCOSE, CAPILLARY
Glucose-Capillary: 99 mg/dL (ref 70–99)
Glucose-Capillary: 112 mg/dL — ABNORMAL HIGH (ref 70–99)
Glucose-Capillary: 124 mg/dL — ABNORMAL HIGH (ref 70–99)
Glucose-Capillary: 216 mg/dL — ABNORMAL HIGH (ref 70–99)

## 2020-07-21 LAB — CBC
HCT: 34.1 % — ABNORMAL LOW (ref 36.0–46.0)
Hemoglobin: 10.6 g/dL — ABNORMAL LOW (ref 12.0–15.0)
MCH: 27 pg (ref 26.0–34.0)
MCHC: 31.1 g/dL (ref 30.0–36.0)
MCV: 87 fL (ref 80.0–100.0)
Platelets: 175 10*3/uL (ref 150–400)
RBC: 3.92 MIL/uL (ref 3.87–5.11)
RDW: 13.8 % (ref 11.5–15.5)
WBC: 9.3 10*3/uL (ref 4.0–10.5)
nRBC: 0 % (ref 0.0–0.2)

## 2020-07-21 LAB — CREATININE, SERUM
Creatinine, Ser: 1.65 mg/dL — ABNORMAL HIGH (ref 0.44–1.00)
GFR calc Af Amer: 41 mL/min — ABNORMAL LOW (ref >60)
GFR calc non Af Amer: 35 mL/min — ABNORMAL LOW (ref >60)

## 2020-07-21 LAB — POCT PREGNANCY, URINE: Preg Test, Ur: NEGATIVE

## 2020-07-21 SURGERY — HYSTERECTOMY, ABDOMINAL, WITH SALPINGO-OOPHORECTOMY
Anesthesia: General

## 2020-07-21 MED ORDER — DIPHENHYDRAMINE HCL 12.5 MG/5ML PO ELIX
12.5000 mg | ORAL_SOLUTION | Freq: Four times a day (QID) | ORAL | Status: DC | PRN
  Filled 2020-07-21: qty 5

## 2020-07-21 MED ORDER — NALOXONE HCL 0.4 MG/ML IJ SOLN
0.4000 mg | INTRAMUSCULAR | Status: DC | PRN
  Filled 2020-07-21: qty 1

## 2020-07-21 MED ORDER — DIPHENHYDRAMINE HCL 50 MG/ML IJ SOLN: 12.5000 mg | Freq: Four times a day (QID) | INTRAMUSCULAR | Status: DC | PRN

## 2020-07-21 MED ORDER — GLYCOPYRROLATE PF 0.2 MG/ML IJ SOSY
PREFILLED_SYRINGE | INTRAMUSCULAR | Status: AC
  Filled 2020-07-21: qty 1

## 2020-07-21 MED ORDER — IBUPROFEN 800 MG PO TABS
800.0000 mg | ORAL_TABLET | Freq: Four times a day (QID) | ORAL | Status: DC
  Administered 2020-07-22 – 2020-07-23 (×2): 800 mg via ORAL
  Filled 2020-07-21 (×2): qty 1

## 2020-07-21 MED ORDER — DEXAMETHASONE SODIUM PHOSPHATE 10 MG/ML IJ SOLN
INTRAMUSCULAR | Status: AC
  Filled 2020-07-21: qty 1

## 2020-07-21 MED ORDER — HEMOSTATIC AGENTS (NO CHARGE) OPTIME
TOPICAL | Status: DC | PRN
  Administered 2020-07-21: 1 via TOPICAL

## 2020-07-21 MED ORDER — EPHEDRINE SULFATE-NACL 50-0.9 MG/10ML-% IV SOSY
PREFILLED_SYRINGE | INTRAVENOUS | Status: DC | PRN
  Administered 2020-07-21: 5 mg via INTRAVENOUS

## 2020-07-21 MED ORDER — ENOXAPARIN SODIUM 40 MG/0.4ML ~~LOC~~ SOLN
40.0000 mg | SUBCUTANEOUS | Status: DC
  Administered 2020-07-22 – 2020-07-23 (×2): 40 mg via SUBCUTANEOUS
  Filled 2020-07-21 (×2): qty 0.4

## 2020-07-21 MED ORDER — LIDOCAINE 2% (20 MG/ML) 5 ML SYRINGE
INTRAMUSCULAR | Status: DC | PRN
  Administered 2020-07-21: 100 mg via INTRAVENOUS

## 2020-07-21 MED ORDER — OXYCODONE HCL 5 MG/5ML PO SOLN: 5.0000 mg | Freq: Once | ORAL | Status: DC | PRN

## 2020-07-21 MED ORDER — HYDROMORPHONE 1 MG/ML IV SOLN
INTRAVENOUS | Status: AC
  Filled 2020-07-21: qty 30

## 2020-07-21 MED ORDER — ACETAMINOPHEN 500 MG PO TABS
1000.0000 mg | ORAL_TABLET | Freq: Once | ORAL | Status: AC
  Administered 2020-07-21: 1000 mg via ORAL
  Filled 2020-07-21: qty 2

## 2020-07-21 MED ORDER — CARVEDILOL 25 MG PO TABS
25.0000 mg | ORAL_TABLET | Freq: Two times a day (BID) | ORAL | Status: DC
  Administered 2020-07-21 – 2020-07-23 (×4): 25 mg via ORAL
  Filled 2020-07-21 (×4): qty 1

## 2020-07-21 MED ORDER — MIDAZOLAM HCL 2 MG/2ML IJ SOLN
INTRAMUSCULAR | Status: DC | PRN
  Administered 2020-07-21: 2 mg via INTRAVENOUS

## 2020-07-21 MED ORDER — HYDROMORPHONE 1 MG/ML IV SOLN
INTRAVENOUS | Status: DC
  Administered 2020-07-21: 1.2 mg via INTRAVENOUS
  Administered 2020-07-21: 30 mg via INTRAVENOUS
  Administered 2020-07-22: 0.3 mg via INTRAVENOUS
  Administered 2020-07-22: 0 mg via INTRAVENOUS

## 2020-07-21 MED ORDER — POTASSIUM CHLORIDE CRYS ER 20 MEQ PO TBCR
40.0000 meq | EXTENDED_RELEASE_TABLET | Freq: Once | ORAL | Status: AC
  Administered 2020-07-21: 40 meq via ORAL
  Filled 2020-07-21: qty 2

## 2020-07-21 MED ORDER — SODIUM CHLORIDE 0.9% FLUSH: 9.0000 mL | INTRAVENOUS | Status: DC | PRN

## 2020-07-21 MED ORDER — PROPOFOL 10 MG/ML IV BOLUS
INTRAVENOUS | Status: AC
  Filled 2020-07-21: qty 20

## 2020-07-21 MED ORDER — LACTATED RINGERS IV SOLN: INTRAVENOUS | Status: DC

## 2020-07-21 MED ORDER — ONDANSETRON HCL 4 MG/2ML IJ SOLN
INTRAMUSCULAR | Status: DC | PRN
  Administered 2020-07-21: 4 mg via INTRAVENOUS

## 2020-07-21 MED ORDER — KETOROLAC TROMETHAMINE 30 MG/ML IJ SOLN
30.0000 mg | Freq: Four times a day (QID) | INTRAMUSCULAR | Status: AC
  Administered 2020-07-21 – 2020-07-22 (×4): 30 mg via INTRAVENOUS
  Filled 2020-07-21 (×4): qty 1

## 2020-07-21 MED ORDER — MIDAZOLAM HCL 2 MG/2ML IJ SOLN
INTRAMUSCULAR | Status: AC
  Filled 2020-07-21: qty 2

## 2020-07-21 MED ORDER — ACETAMINOPHEN 500 MG PO TABS
1000.0000 mg | ORAL_TABLET | Freq: Four times a day (QID) | ORAL | Status: DC
  Administered 2020-07-21 – 2020-07-23 (×6): 1000 mg via ORAL
  Filled 2020-07-21 (×6): qty 2

## 2020-07-21 MED ORDER — DAPAGLIFLOZIN PROPANEDIOL 10 MG PO TABS
10.0000 mg | ORAL_TABLET | Freq: Every day | ORAL | Status: DC
  Administered 2020-07-22 – 2020-07-23 (×2): 10 mg via ORAL
  Filled 2020-07-21 (×2): qty 1

## 2020-07-21 MED ORDER — SODIUM CHLORIDE (PF) 0.9 % IJ SOLN
INTRAMUSCULAR | Status: AC
  Filled 2020-07-21: qty 50

## 2020-07-21 MED ORDER — PHENYLEPHRINE 40 MCG/ML (10ML) SYRINGE FOR IV PUSH (FOR BLOOD PRESSURE SUPPORT)
PREFILLED_SYRINGE | INTRAVENOUS | Status: DC | PRN
  Administered 2020-07-21 (×4): 80 ug via INTRAVENOUS

## 2020-07-21 MED ORDER — FENTANYL CITRATE (PF) 250 MCG/5ML IJ SOLN
INTRAMUSCULAR | Status: DC | PRN
  Administered 2020-07-21: 100 ug via INTRAVENOUS

## 2020-07-21 MED ORDER — ROCURONIUM BROMIDE 10 MG/ML (PF) SYRINGE
PREFILLED_SYRINGE | INTRAVENOUS | Status: DC | PRN
  Administered 2020-07-21: 60 mg via INTRAVENOUS
  Administered 2020-07-21: 10 mg via INTRAVENOUS

## 2020-07-21 MED ORDER — OXYCODONE HCL 5 MG PO TABS: 5.0000 mg | ORAL_TABLET | Freq: Once | ORAL | Status: DC | PRN

## 2020-07-21 MED ORDER — CEFAZOLIN SODIUM-DEXTROSE 2-4 GM/100ML-% IV SOLN
2.0000 g | INTRAVENOUS | Status: AC
  Administered 2020-07-21: 2 g via INTRAVENOUS
  Filled 2020-07-21: qty 100

## 2020-07-21 MED ORDER — SIMETHICONE 80 MG PO CHEW
80.0000 mg | CHEWABLE_TABLET | Freq: Four times a day (QID) | ORAL | Status: DC | PRN
  Filled 2020-07-21: qty 1

## 2020-07-21 MED ORDER — PANTOPRAZOLE SODIUM 40 MG IV SOLR
40.0000 mg | Freq: Every day | INTRAVENOUS | Status: DC
  Administered 2020-07-21: 40 mg via INTRAVENOUS
  Filled 2020-07-21: qty 40

## 2020-07-21 MED ORDER — INSULIN GLARGINE 100 UNIT/ML ~~LOC~~ SOLN
20.0000 [IU] | Freq: Every day | SUBCUTANEOUS | Status: DC
  Administered 2020-07-22 – 2020-07-23 (×2): 20 [IU] via SUBCUTANEOUS
  Filled 2020-07-21 (×2): qty 0.2

## 2020-07-21 MED ORDER — FENTANYL CITRATE (PF) 250 MCG/5ML IJ SOLN
INTRAMUSCULAR | Status: AC
  Filled 2020-07-21: qty 5

## 2020-07-21 MED ORDER — POVIDONE-IODINE 10 % EX SWAB
2.0000 "application " | Freq: Once | CUTANEOUS | Status: AC
  Administered 2020-07-21: 2 via TOPICAL

## 2020-07-21 MED ORDER — BUPIVACAINE LIPOSOME 1.3 % IJ SUSP
20.0000 mL | Freq: Once | INTRAMUSCULAR | Status: DC
  Filled 2020-07-21: qty 20

## 2020-07-21 MED ORDER — GABAPENTIN 100 MG PO CAPS
100.0000 mg | ORAL_CAPSULE | Freq: Every day | ORAL | Status: DC
  Administered 2020-07-21 – 2020-07-22 (×2): 100 mg via ORAL
  Filled 2020-07-21 (×2): qty 1

## 2020-07-21 MED ORDER — ONDANSETRON HCL 4 MG/2ML IJ SOLN
INTRAMUSCULAR | Status: AC
  Filled 2020-07-21: qty 2

## 2020-07-21 MED ORDER — FENTANYL CITRATE (PF) 100 MCG/2ML IJ SOLN: 25.0000 ug | INTRAMUSCULAR | Status: DC | PRN

## 2020-07-21 MED ORDER — PROMETHAZINE HCL 25 MG/ML IJ SOLN: 6.2500 mg | INTRAMUSCULAR | Status: DC | PRN

## 2020-07-21 MED ORDER — MEXILETINE HCL 150 MG PO CAPS
300.0000 mg | ORAL_CAPSULE | Freq: Two times a day (BID) | ORAL | Status: DC
  Administered 2020-07-21 – 2020-07-23 (×4): 300 mg via ORAL
  Filled 2020-07-21 (×6): qty 2

## 2020-07-21 MED ORDER — ORAL CARE MOUTH RINSE: 15.0000 mL | Freq: Once | OROMUCOSAL | Status: AC

## 2020-07-21 MED ORDER — VASOPRESSIN 20 UNIT/ML IV SOLN
INTRAVENOUS | Status: AC
  Filled 2020-07-21: qty 1

## 2020-07-21 MED ORDER — DEXAMETHASONE SODIUM PHOSPHATE 10 MG/ML IJ SOLN
INTRAMUSCULAR | Status: DC | PRN
  Administered 2020-07-21: 10 mg via INTRAVENOUS

## 2020-07-21 MED ORDER — CHLORHEXIDINE GLUCONATE 0.12 % MT SOLN
15.0000 mL | Freq: Once | OROMUCOSAL | Status: AC
  Administered 2020-07-21: 15 mL via OROMUCOSAL
  Filled 2020-07-21: qty 15

## 2020-07-21 MED ORDER — ONDANSETRON HCL 4 MG/2ML IJ SOLN: 4.0000 mg | Freq: Four times a day (QID) | INTRAMUSCULAR | Status: DC | PRN

## 2020-07-21 MED ORDER — BUPIVACAINE HCL (PF) 0.5 % IJ SOLN
INTRAMUSCULAR | Status: AC
  Filled 2020-07-21: qty 30

## 2020-07-21 MED ORDER — MENTHOL 3 MG MT LOZG: 1.0000 | LOZENGE | OROMUCOSAL | Status: DC | PRN

## 2020-07-21 MED ORDER — GLYCOPYRROLATE PF 0.2 MG/ML IJ SOSY
PREFILLED_SYRINGE | INTRAMUSCULAR | Status: DC | PRN
Start: 2020-07-21 — End: 2020-07-21
  Administered 2020-07-21 (×2): .1 mg via INTRAVENOUS

## 2020-07-21 MED ORDER — BUPIVACAINE LIPOSOME 1.3 % IJ SUSP
INTRAMUSCULAR | Status: DC | PRN
  Administered 2020-07-21: 40 mL

## 2020-07-21 MED ORDER — LOSARTAN POTASSIUM 50 MG PO TABS: 50.0000 mg | ORAL_TABLET | Freq: Every day | ORAL | Status: DC

## 2020-07-21 MED ORDER — KETOROLAC TROMETHAMINE 30 MG/ML IJ SOLN: 30.0000 mg | Freq: Once | INTRAMUSCULAR | Status: DC

## 2020-07-21 MED ORDER — SCOPOLAMINE 1 MG/3DAYS TD PT72
1.0000 | MEDICATED_PATCH | Freq: Once | TRANSDERMAL | Status: DC
  Administered 2020-07-21: 1.5 mg via TRANSDERMAL
  Filled 2020-07-21: qty 1

## 2020-07-21 MED ORDER — PHENYLEPHRINE HCL-NACL 10-0.9 MG/250ML-% IV SOLN
INTRAVENOUS | Status: DC | PRN
  Administered 2020-07-21: 20 ug/min via INTRAVENOUS

## 2020-07-21 MED ORDER — DIPHENHYDRAMINE HCL 50 MG/ML IJ SOLN: 50.0000 mg | Freq: Three times a day (TID) | INTRAMUSCULAR | Status: DC | PRN

## 2020-07-21 MED ORDER — AMLODIPINE BESYLATE 5 MG PO TABS
5.0000 mg | ORAL_TABLET | Freq: Every day | ORAL | Status: DC
  Administered 2020-07-22 – 2020-07-23 (×2): 5 mg via ORAL
  Filled 2020-07-21 (×2): qty 1

## 2020-07-21 MED ORDER — PROPOFOL 10 MG/ML IV BOLUS
INTRAVENOUS | Status: DC | PRN
  Administered 2020-07-21: 180 mg via INTRAVENOUS

## 2020-07-21 SURGICAL SUPPLY — 41 items
BENZOIN TINCTURE PRP APPL 2/3 (GAUZE/BANDAGES/DRESSINGS) ×4 IMPLANT
CANISTER SUCT 3000ML PPV (MISCELLANEOUS) ×4 IMPLANT
DRAPE CESAREAN BIRTH W POUCH (DRAPES) ×4 IMPLANT
DRSG PAD ABDOMINAL 8X10 ST (GAUZE/BANDAGES/DRESSINGS) ×4 IMPLANT
DURAPREP 26ML APPLICATOR (WOUND CARE) ×4 IMPLANT
ELECT NEEDLE TIP 2.8 STRL (NEEDLE) ×4 IMPLANT
GAUZE 4X4 16PLY RFD (DISPOSABLE) ×4 IMPLANT
GAUZE SPONGE 4X4 12PLY STRL (GAUZE/BANDAGES/DRESSINGS) ×4 IMPLANT
GLOVE BIO SURGEON STRL SZ 6.5 (GLOVE) ×4 IMPLANT
GLOVE BIOGEL PI IND STRL 7.0 (GLOVE) ×6 IMPLANT
GLOVE BIOGEL PI INDICATOR 7.0 (GLOVE) ×2
GOWN STRL REUS W/ TWL LRG LVL3 (GOWN DISPOSABLE) ×9 IMPLANT
GOWN STRL REUS W/TWL LRG LVL3 (GOWN DISPOSABLE) ×3
HEMOSTAT ARISTA ABSORB 3G PWDR (HEMOSTASIS) ×4 IMPLANT
KIT TURNOVER KIT B (KITS) ×4 IMPLANT
NEEDLE HYPO 22GX1.5 SAFETY (NEEDLE) ×8 IMPLANT
NS IRRIG 1000ML POUR BTL (IV SOLUTION) ×4 IMPLANT
PACK ABDOMINAL GYN (CUSTOM PROCEDURE TRAY) ×4 IMPLANT
PAD ARMBOARD 7.5X6 YLW CONV (MISCELLANEOUS) ×4 IMPLANT
PAD OB MATERNITY 4.3X12.25 (PERSONAL CARE ITEMS) ×4 IMPLANT
PENCIL SMOKE EVACUATOR (MISCELLANEOUS) ×4 IMPLANT
RETRACTOR WND ALEXIS 25 LRG (MISCELLANEOUS) IMPLANT
RETRACTOR WOUND ALXS 34CM XLRG (MISCELLANEOUS) IMPLANT
RTRCTR WOUND ALEXIS 25CM LRG (MISCELLANEOUS)
RTRCTR WOUND ALEXIS 34CM XLRG (MISCELLANEOUS)
SPONGE LAP 18X18 RF (DISPOSABLE) ×12 IMPLANT
STAPLER VISISTAT 35W (STAPLE) ×4 IMPLANT
STRIP CLOSURE SKIN 1/2X4 (GAUZE/BANDAGES/DRESSINGS) ×4 IMPLANT
SUT CHROMIC 2 0 CT 1 (SUTURE) ×4 IMPLANT
SUT PDS AB 0 CTX 60 (SUTURE) ×4 IMPLANT
SUT PLAIN 2 0 XLH (SUTURE) ×4 IMPLANT
SUT VIC AB 0 CT1 27 (SUTURE) ×5
SUT VIC AB 0 CT1 27XBRD ANBCTR (SUTURE) ×9 IMPLANT
SUT VIC AB 0 CT1 27XCR 8 STRN (SUTURE) ×6 IMPLANT
SUT VIC AB 2-0 CT1 27 (SUTURE) ×1
SUT VIC AB 2-0 CT1 TAPERPNT 27 (SUTURE) ×3 IMPLANT
SUT VIC AB 4-0 KS 27 (SUTURE) IMPLANT
SYR CONTROL 10ML LL (SYRINGE) ×8 IMPLANT
TAPE PAPER 3X10 WHT MICROPORE (GAUZE/BANDAGES/DRESSINGS) ×4 IMPLANT
TOWEL GREEN STERILE FF (TOWEL DISPOSABLE) ×8 IMPLANT
TRAY FOLEY W/BAG SLVR 14FR (SET/KITS/TRAYS/PACK) ×4 IMPLANT

## 2020-07-21 NOTE — Anesthesia Procedure Notes (Signed)
Procedure Name: Intubation Date/Time: 07/21/2020 12:48 PM Performed by: Darletta Moll, CRNA Pre-anesthesia Checklist: Patient identified, Emergency Drugs available, Suction available and Patient being monitored Patient Re-evaluated:Patient Re-evaluated prior to induction Oxygen Delivery Method: Circle system utilized Preoxygenation: Pre-oxygenation with 100% oxygen Induction Type: IV induction Ventilation: Mask ventilation without difficulty Laryngoscope Size: Mac and 4 Grade View: Grade II Tube type: Oral Tube size: 7.0 mm Number of attempts: 1 Airway Equipment and Method: Stylet Placement Confirmation: ETT inserted through vocal cords under direct vision,  positive ETCO2 and breath sounds checked- equal and bilateral Secured at: 21 cm Tube secured with: Tape Dental Injury: Teeth and Oropharynx as per pre-operative assessment  Comments: Intubation performed by Taressa Rauh hardy, srna

## 2020-07-21 NOTE — Transfer of Care (Cosign Needed)
Immediate Anesthesia Transfer of Care Note  Patient: Adrienne Lane  Procedure(s) Performed: HYSTERECTOMY ABDOMINAL WITH BILATERAL SALPINGO-OOPHORECTOMY  Patient Location: PACU  Anesthesia Type:General  Level of Consciousness: drowsy and patient cooperative  Airway & Oxygen Therapy: Patient Spontanous Breathing and Patient connected to face mask  Post-op Assessment: Report given to RN, Post -op Vital signs reviewed and stable and Patient moving all extremities  Post vital signs: Reviewed and stable  Last Vitals:  Vitals Value Taken Time  BP 126/79 07/21/20 1516  Temp    Pulse 52 07/21/20 1517  Resp 15 07/21/20 1517  SpO2 100 % 07/21/20 1517  Vitals shown include unvalidated device data.  Last Pain:  Vitals:   07/21/20 1052  TempSrc:   PainSc: 0-No pain      Patients Stated Pain Goal: 3 (55/01/58 6825)  Complications: No complications documented.

## 2020-07-21 NOTE — Op Note (Signed)
OPERATIVE NOTE   Aslin Farinas DOB: Jan 17, 1966  UUV:[id number]  Date of Surgery: July 21, 2020  Preoperative Diagnosis:  Large Fibroid uterus  Postoperative Diagnosis:  Same as above   Procedure:  1. Exploratory Laparotomy  2. Total abdominal hysterectomy  3. Bilateral salpingoopherectomy    Surgeon:  Mady Haagensen. Alwyn Pea, M.D.   Assistant:  Everett Graff, M.D.  Anesthetic:  General Endotracheal   Fluids: 2500 mL LR   UOP: 400 mL   EBL: 400 mL   Catheter: Foley catheter  Complications: none.   Medications: Ancef 2GM IV   Findings: very large globular appearing uterus approximately 30 cm in diameter weighing 2500 grams , normal appearing bilateral ovaries and bilateral fallopian tubes.    Technique:  The patient was placed in dorsal supine position. After adequate general anesthesia was achieved, the patient was prepped and draped in the usual sterile fashion after the foley had been placed. A midline vertical incision was made with the 10 blade scalpel and the incision brought down through the subcutaneous layer with the bovie. The fascia was incised with the scalpel in a vertical manner and extended in a superiorly and inferiorly with the Bovie.  The rectus muscles were split in the midline and a bowel free portion of the peritoneum was tented up between two hemostats and was entered sharply using Metzenbaum scissors. The peritoneal incision was extended cephalad and caudad with good visualization of the bowel and bladder. The Balfour abdominal retractor was placed and the bowel was packed away with moist lap packs. Sharp towel clamps were placed on the uterine fundus and the uterus brought out of the abdominal cavity.   The round ligaments were suture ligated with 0-vicryl bilaterally and incised with the bovie. The bovie was then used to incise a transverse curvilinear incision in the vesico-cervical fascia. The ureters were visualized bilaterally coursing well beneath the  operative field. The mesosalpinx of each fallopian tube was clamped with the Ligasure Impact, cauterized and cut. The utero-ovarian ligament was then entered with the bovie, The Ligasure impact was placed and the ligament cauterized and incised.The same was performed on the contralateral side. Each pedicle of the utero ovarian ligament was noted to be hemostatic. The bladder flap was then retracted inferiorly with blunt and cautery dissection below the external cervical os. The uterine arteries were clamped bilaterally with the heaneys and incised with the mayos. Each pedicle was then secured with a stitch of 0-vicryl. The Uterine corpus was then incised off the cervix with the bovie with the fallopian tubes attached and handed off the field to be sent to Pathology.   The uterosacral cardinal ligament complex was sequentially clamped  straight heaney clamps transected with Mayo scissors and secured with 0-vicryl. This was done bilaterally.     At the level of the reflection of the vagina onto the cervix, two curved heaneys were placed and the cervix removed with the mayo scissors.  The cuff angles were then secured with 0-vicryl and the cuff was closed with interrupted figure of eight stitches of 0-vicryl.  Hemostasis was assured. Attention was then turned to each ovary.  The Infundibulopelvic ligsments on each side were isolated and the ureters were seen peristalsing well out of the field of dissection.  Each IP ligament was clamped with the Ligasure impact just under the ovarian cortex, cauterized and transected.  Each ovary was handed off the field to be sent to Pathology.  Doylene Bode was placed over all pedicles and surgical areas were  hemostatic.   The instruments and laps were removed from the abdominal cavity and the peritoneum was closed with a running stitch of 2-vicryl. Interrupted suture of 2-0 chromic re-approximated the rectus muscle.The fascia was closed with looped 0-PDS in a running manner The  subcutaneous tissue was irrigated and bleeders cauterized.  1:1 ratio of Expiril and 0.5% Marcaine was infiltrated along the incision. The subcutaneous layer was closed with interrupted stitches of 2-0 plain gut. The skin was closed with staples.  Sponge, needle and instrument counts were correct x 3. Pt tolerated the procedure well and was transferred to the recovery room in stable condition.       Sanjuana Kava MD

## 2020-07-21 NOTE — H&P (Signed)
Adrienne Lane is an 54 y.o. female  P0 with very large fibroid uterus.  She is symptomatic with abdominal pain, bulk symptoms and subsequent hospitalizations for renal failure due to compression on the ureters.  She is menopausal denies abnormal bleeding currently, but previously had a history of heavy menstrual bleeding, flooding and passing of clots.   She has no history of cervical dysplasia or HR HPV.   She has had a colonoscopy in 2019.  Urology placed ureteral stents (Dr. Claudia Desanctis) 05/24/20.     Past Medical History:  Diagnosis Date  . Atrial fibrillation with RVR ([place])   . Bigeminy 01/2020  . CHF (congestive heart failure) ([place]) 10/20/2019  . CKD (chronic kidney disease), stage IV ([place])   . Diabetes mellitus without complication ([place])   . DOE (dyspnea on exertion)    walking upstairs or up hill resolves in one minute  . Dyspnea    with activity  . Fibroids   . History of kidney stones   . History of recent blood transfusion 02/26/2020  . Hypertension   . Iron deficiency anemia   . Non-ischemic cardiomyopathy ([facility])    tachycardia induced  . Obese   . Peripheral edema   . Premature ventricular contractions (PVCs) ([facility])   . Umbilical hernia   . Umbilical hernia   . Wears glasses     Past Surgical History:  Procedure Laterality Date  . CHOLECYSTECTOMY    . CYSTOSCOPY W/ URETERAL STENT PLACEMENT Bilateral 05/24/2020   Procedure: CYSTOSCOPY WITH RETROGRADE PYELOGRAM/URETERAL STENT PLACEMENT;  Surgeon: Robley Fries, MD;  Location: [facility];  Service: Urology;  Laterality: Bilateral;  . CYSTOSCOPY/RETROGRADE/URETEROSCOPY Bilateral 04/03/2020   Procedure: CYSTOSCOPY/RETROGRADE/URETEROSCOPY;  Surgeon: Ardis Hughs, MD;  Location: [facility];  Service: Urology;  Laterality: Bilateral;  . IR FLUORO GUIDE CV LINE RIGHT  02/21/2020  . IR US GUIDE VASC ACCESS RIGHT  02/21/2020  . NEPHROLITHOTOMY Left 02/14/2020   Procedure: NEPHROLITHOTOMY PERCUTANEOUS/ SURGEON ACCESS/ LEFT PERCUTANEOUS  NEPHROSTOMY TUBE PLACEMENT;  Surgeon: Ardis Hughs, MD;  Location: [facility];  Service: Urology;  Laterality: Left;  . NEPHROLITHOTOMY Left 02/21/2020   Procedure: NEPHROLITHOTOMY PERCUTANEOUS SECOND LOOK;  Surgeon: Ardis Hughs, MD;  Location: [facility];  Service: Urology;  Laterality: Left;  . NEPHROLITHOTOMY Left 02/26/2020   Procedure: NEPHROLITHOTOMY PERCUTANEOUS;  Surgeon: Ceasar Mons, MD;  Location: [facility];  Service: Urology;  Laterality: Left;  NEED 150 MIN  . NEPHROLITHOTOMY Right 03/24/2020   Procedure: NEPHROLITHOTOMY PERCUTANEOUS WITH ACCESS LEFT STENT REMOVAL;  Surgeon: Ardis Hughs, MD;  Location: [facility];  Service: Urology;  Laterality: Right;  . RIGHT HEART CATH N/A 11/09/2019   Procedure: RIGHT HEART CATH;  Surgeon: Jolaine Artist, MD;  Location: [facility];  Service: Cardiovascular;  Laterality: N/A;  . WISDOM TOOTH EXTRACTION      Family History  Problem Relation Age of Onset  . Atrial fibrillation Mother   . Hypertension Mother   . Stroke Mother   . Diabetes Mother   . Diabetes Father   . Hypertension Father   . Diabetes Sister   . Hypertension Sister   . Diabetes Brother   . Hypertension Brother   . Diabetes Brother   . Heart attack Brother     Social History:  reports that she has never smoked. She has never used smokeless tobacco. She reports previous alcohol use. She reports that she does not use drugs.  Allergies:  Allergies  Allergen Reactions  . Adhesive [  Tape]     Tears skin, can tolerate paper tape    Medications Prior to Admission  Medication Sig Dispense Refill Last Dose  . apixaban (ELIQUIS) 5 MG TABS tablet Take 1 tablet (5 mg total) by mouth 2 (two) times daily. Resume when bleeding has stopped in your urine. (Patient taking differently: Take 5 mg by mouth 2 (two) times daily. ) 60 tablet 6   . carvedilol (COREG) 25 MG tablet Take 1 tablet (25 mg total) by mouth 2 (two) times daily with a meal. 180 tablet 3    . dapagliflozin propanediol (FARXIGA) 10 MG TABS tablet Take 1 tablet (10 mg total) by mouth daily before breakfast. 30 tablet 6   . ferrous sulfate 325 (65 FE) MG tablet Take 1 tablet (325 mg total) by mouth daily with breakfast. 30 tablet 3   . hydrALAZINE (APRESOLINE) 100 MG tablet Take 1 tablet (100 mg total) by mouth every 8 (eight) hours. 90 tablet 6   . insulin glargine (LANTUS) 100 UNIT/ML injection Inject 0.2 mLs (20 Units total) into the skin every morning. 10 mL 2   . isosorbide mononitrate (IMDUR) 60 MG 24 hr tablet Take 1 tablet (60 mg total) by mouth daily. 30 tablet 6   . metolazone (ZAROXOLYN) 5 MG tablet Take only as directed by CHF clinic (Patient taking differently: Take 5 mg by mouth daily as needed (swelling). ) 10 tablet 0   . mexiletine (MEXITIL) 150 MG capsule Take 2 capsules (300 mg total) by mouth every 12 (twelve) hours. 120 capsule 6   . Multiple Vitamins-Minerals (WOMENS MULTI PO) Take 1 tablet by mouth daily.     . potassium chloride (KLOR-CON) 10 MEQ tablet Take 4 tablets (40 mEq total) by mouth daily. (Patient taking differently: Take 20 mEq by mouth 2 (two) times daily. ) 120 tablet 6   . torsemide (DEMADEX) 20 MG tablet Take 2 tablets (40 mg total) by mouth 2 (two) times daily. 120 tablet 6   . traMADol (ULTRAM) 50 MG tablet Take 1-2 tablets (50-100 mg total) by mouth every 6 (six) hours as needed for moderate pain. 15 tablet 0   . amLODipine (NORVASC) 5 MG tablet Take 1 tablet (5 mg total) by mouth daily. 30 tablet 6   . glucose blood test strip Use as instructed 100 each 12   . Lancets (ONETOUCH ULTRASOFT) lancets Check CBG twice a day 60 each 5   . losartan (COZAAR) 50 MG tablet Take 1 tablet (50 mg total) by mouth daily. (Patient not taking: Reported on 07/08/2020) 90 tablet 3 Not Taking at Unknown time  . senna-docusate (SENOKOT-S) 8.6-50 MG tablet Take 1 tablet by mouth at bedtime. (Patient not taking: Reported on 07/08/2020) 100 tablet 0 Not Taking at Unknown  time    Review of Systems  As per HPI  There were no vitals taken for this visit. Physical Exam GU:  External vulva: normal vaginal vault: normal ruggae; cervix: grossly   Normal  Uterus: 28 weeks size, immobile, fixed. Adnexa: limited palpation due to size of uterus.   IMAGING:  TRANSABDOMINAL AND TRANSVAGINAL ULTRASOUND OF PELVIS 11/20  TECHNIQUE: Both transabdominal and transvaginal ultrasound examinations of the pelvis were performed. Transabdominal technique was performed for global imaging of the pelvis including uterus, ovaries, adnexal regions, and pelvic cul-de-sac. It was necessary to proceed with endovaginal exam following the transabdominal exam to visualize the uterus, endometrium, and ovaries.  COMPARISON:  Prior CT from 10/23/2019.  FINDINGS: Uterus  Evaluation of  the uterus is limited given body habitus in size. Uterus measures up to approximately 16.3 x 15.8 x 15.8 cm. Previously identified large masslike lesion involving the uterus is not well delineated given size, and could reflect a large fibroid or possibly endometrial mass.  Endometrium  Not definitely visualized.  Right ovary  Not visualized.  No adnexal mass.  Left ovary  Not visualized.  No adnexal mass.  Other findings  Moderate volume free fluid within the pelvis.  IMPRESSION: 1. Technically limited exam due to body habitus. 2. Marked enlargement of the uterus, corresponding with abnormality seen on prior CT. Unclear whether this reflects a large uterine fibroid or possibly endometrial mass, not well assessed on this technically limited exam. Given the limitations of this study, further assessment with dedicated pelvic MRI is recommended for further characterization. Given the patient's low GFR, a noncontrast examination could be performed. 3. Nonvisualization of the ovaries.  No other adnexal mass. 4. Moderate volume free fluid within the  pelvis.   Assessment/Plan: Adrienne Lane is a 54 yo with symptomatic very large fibroid uterus manifested by bulk symptoms and renal compression. Patient understands that the definitive management is necessary due to secondary effect on her other organs.   We previously discussed that a vertical midline incision would be necessary to do a total abdominal hysterectomy.   We reviewed the risks of a hysterectomy  to include, but not limited to hemorrhage, bleeding possibly requiring a transfusion, infection, wound breakdown or poor healing, diagnosis of malignancy requiring further surgery by GYN oncology,  damage to organs in the abdomen /pelvis w/ possible need for further surgical repair (like bowel or bladder injury),  blood clots, PE/MI/stroke.  We also discussed long term risk of post op prolapse, worsening of UI sx, possible need for [facility] or further surgery for repair.   All questions placed by patient answered. Patient voiced understanding and consents to surgery.  We also discussed ovarian preservation.  She was counseled about the 1/70 lifetime risk of ovarian cancer and 5-10% risk for future ovarian surgery after the hysterectomy.  PT desires removal of her ovaries at the time of the procedure.     Amareon Phung 07/21/2020, 10:24 AM

## 2020-07-21 NOTE — Anesthesia Postprocedure Evaluation (Signed)
Anesthesia Post Note  Patient: Adrienne Lane  Procedure(s) Performed: HYSTERECTOMY ABDOMINAL WITH BILATERAL SALPINGO-OOPHORECTOMY     Patient location during evaluation: PACU Anesthesia Type: General Level of consciousness: awake and alert and oriented Pain management: pain level controlled Vital Signs Assessment: post-procedure vital signs reviewed and stable Respiratory status: spontaneous breathing, nonlabored ventilation and respiratory function stable Cardiovascular status: blood pressure returned to baseline Postop Assessment: no apparent nausea or vomiting Anesthetic complications: no   No complications documented.  Last Vitals:  Vitals:   07/21/20 1545 07/21/20 1621  BP: 127/65 124/64  Pulse: (!) 51 (!) 53  Resp: (!) 21 18  Temp: (!) 36.2 C 36.4 C  SpO2: 94% 96%    Last Pain:  Vitals:   07/21/20 1621  TempSrc: Oral  PainSc:                  Brennan Bailey

## 2020-07-22 ENCOUNTER — Encounter (HOSPITAL_COMMUNITY): Payer: Self-pay | Admitting: Obstetrics & Gynecology

## 2020-07-22 LAB — BASIC METABOLIC PANEL
Anion gap: 11 (ref 5–15)
BUN: 30 mg/dL — ABNORMAL HIGH (ref 6–20)
CO2: 21 mmol/L — ABNORMAL LOW (ref 22–32)
Calcium: 8.2 mg/dL — ABNORMAL LOW (ref 8.9–10.3)
Chloride: 100 mmol/L (ref 98–111)
Creatinine, Ser: 2.38 mg/dL — ABNORMAL HIGH (ref 0.44–1.00)
GFR calc Af Amer: 26 mL/min — ABNORMAL LOW (ref >60)
GFR calc non Af Amer: 23 mL/min — ABNORMAL LOW (ref >60)
Glucose, Bld: 216 mg/dL — ABNORMAL HIGH (ref 70–99)
Potassium: 3.7 mmol/L (ref 3.5–5.1)
Sodium: 132 mmol/L — ABNORMAL LOW (ref 135–145)

## 2020-07-22 LAB — CBC
HCT: 26.8 % — ABNORMAL LOW (ref 36.0–46.0)
Hemoglobin: 8.6 g/dL — ABNORMAL LOW (ref 12.0–15.0)
MCH: 26.7 pg (ref 26.0–34.0)
MCHC: 32.1 g/dL (ref 30.0–36.0)
MCV: 83.2 fL (ref 80.0–100.0)
Platelets: 204 10*3/uL (ref 150–400)
RBC: 3.22 MIL/uL — ABNORMAL LOW (ref 3.87–5.11)
RDW: 13.9 % (ref 11.5–15.5)
WBC: 8 10*3/uL (ref 4.0–10.5)
nRBC: 0 % (ref 0.0–0.2)

## 2020-07-22 LAB — GLUCOSE, CAPILLARY
Glucose-Capillary: 175 mg/dL — ABNORMAL HIGH (ref 70–99)
Glucose-Capillary: 175 mg/dL — ABNORMAL HIGH (ref 70–99)
Glucose-Capillary: 226 mg/dL — ABNORMAL HIGH (ref 70–99)
Glucose-Capillary: 190 mg/dL — ABNORMAL HIGH (ref 70–99)
Glucose-Capillary: 272 mg/dL — ABNORMAL HIGH (ref 70–99)

## 2020-07-22 MED ORDER — PANTOPRAZOLE SODIUM 40 MG PO TBEC
40.0000 mg | DELAYED_RELEASE_TABLET | Freq: Every day | ORAL | Status: DC
  Administered 2020-07-22: 40 mg via ORAL
  Filled 2020-07-22: qty 1

## 2020-07-22 MED ORDER — OXYCODONE HCL 5 MG PO TABS
10.0000 mg | ORAL_TABLET | ORAL | Status: DC | PRN
  Administered 2020-07-22: 10 mg via ORAL
  Filled 2020-07-22: qty 2

## 2020-07-22 NOTE — Progress Notes (Signed)
1 Day Post-Op Procedure(s): HYSTERECTOMY ABDOMINAL WITH BILATERAL SALPINGO-OOPHORECTOMY  Subjective: Patient doing well without complaints.  She is ambulating, tolerating po without difficulty.  She denies voiding yet since foley d/c'd. No flatus or BM.  No nausea or vomiting.  No chest pain or shortness of breath.   Objective: I have reviewed patient's vital signs, intake and output, medications and labs.  General: alert, cooperative and no distress GI: soft, non-tender; bowel sounds normal; no masses,  no organomegaly and incision: clean, dry, intact and staples in place, and dressing Extremities: extremities normal, atraumatic, no cyanosis or edema and Homans sign is negative, no sign of DVT  Assessment: s/p Procedure(s): HYSTERECTOMY ABDOMINAL WITH BILATERAL SALPINGO-OOPHORECTOMY: progressing well and tolerating diet  Plan: Encourage ambulation cbc, bmp now  Most likely discharge tomorrow   LOS: 1 day    [facility] 07/22/2020, 3:05 PM

## 2020-07-22 NOTE — Progress Notes (Signed)
MD notified of pt's bradycardic episodes at two readings (mid 30's) last night; pt was asymptomatic, in no acute distress nor discomfort; ambulated twice out in the hallway with not much difficulty; with good urine output; PCA pump was available at disposal but wasn't used much through the night. MD gave verbal orders to d/c PCA pump, remove foley and keep IV at [facility]. Will continue to monitor patient.

## 2020-07-23 LAB — GLUCOSE, CAPILLARY: Glucose-Capillary: 119 mg/dL — ABNORMAL HIGH (ref 70–99)

## 2020-07-23 LAB — SURGICAL PATHOLOGY

## 2020-07-23 MED ORDER — HYDROCODONE-ACETAMINOPHEN 5-325 MG PO TABS
1.0000 | ORAL_TABLET | Freq: Four times a day (QID) | ORAL | 0 refills | Status: DC | PRN
Start: 2020-07-23 — End: 2020-10-22

## 2020-07-23 MED ORDER — SENNOSIDES-DOCUSATE SODIUM 8.6-50 MG PO TABS: 1.0000 | ORAL_TABLET | Freq: Two times a day (BID) | ORAL | 0 refills | Status: DC

## 2020-07-23 MED FILL — ?AMLODIPINE BESYLATE 5MG TA: 5 | 30 days supply | Qty: 30 | Fill #3

## 2020-07-23 MED FILL — FERROUS SULFATE 325 MG TAB: 325 (65 FE) | 30 days supply | Qty: 30 | Fill #2

## 2020-07-23 MED FILL — ISOSORBIDE MN ER 60 MG TAB: 60 | 30 days supply | Qty: 30 | Fill #5

## 2020-07-23 MED FILL — hydrALAZINE HCL 100 MG TABS: 100 | 30 days supply | Qty: 90 | Fill #3

## 2020-07-23 MED FILL — POTASSIUM CHLORIDE ER 10 ME: 10 | 30 days supply | Qty: 120 | Fill #2

## 2020-07-23 NOTE — Plan of Care (Signed)
Pt doing well. Pt given D/C instructions with verbal understanding. Rx's were given to the Pt by MD. Pt's incision is clean and dry with no sign of infection. Pt's IV was removed prior to D/C. Pt D/C'd home via wheelchair per MD order. Pt is stable @ D/C and has no other needs at this time. Holli Humbles, RN

## 2020-07-23 NOTE — Discharge Summary (Signed)
Physician Discharge Summary  Patient ID: Noele Icenhour MRN: [id number] DOB/AGE: [birth year] 52 y.o.  Admit date: 07/21/2020 Discharge date: 07/23/2020  Admission Diagnoses: Large fibroid uterus  Discharge Diagnoses:  Active Problems:   Fibroids   S/P TAH (total abdominal hysterectomy)   Discharged Condition: good  Hospital Course: Patient taken to operating room where TAH / BSO was performed without complication.  Routine post operative care was done.  Patient met discharge criteria on post operative day #2  Consults: None  Significant Diagnostic Studies: labs: post op Hb 8  Treatments: IV hydration and analgesia: Dilaudid   PROCEDURE:  TAH / BSO  Discharge Exam: Blood pressure 126/62, pulse 63, temperature 97.7 F (36.5 C), temperature source Oral, resp. rate 16, height 5' 8"  (1.727 m), weight (!) 102.1 kg, SpO2 98 %. General: alert, cooperative and no distress GI: soft, non-tender; bowel sounds normal; no masses,  no organomegaly and incision: clean, dry and intact Extremities: extremities normal, atraumatic, no cyanosis or edema and Homans sign is negative, no sign of DVT  Disposition: Discharge disposition: 01-Home or Self Care       Discharge Instructions    (HEART FAILURE PATIENTS) Call MD:  Anytime you have any of the following symptoms: 1) 3 pound weight gain in 24 hours or 5 pounds in 1 week 2) shortness of breath, with or without a dry hacking cough 3) swelling in the hands, feet or stomach 4) if you have to sleep on extra pillows at night in order to breathe.   Complete by: As directed    Call MD for:   Complete by: As directed    Abnormal foul smelling vaginal discharge or heavy vaginal bleeding (soaking a pad an hour)   Call MD for:  difficulty breathing, headache or visual disturbances   Complete by: As directed    Call MD for:  extreme fatigue   Complete by: As directed    Call MD for:  hives   Complete by: As directed    Call MD for:  persistant  dizziness or light-headedness   Complete by: As directed    Call MD for:  persistant nausea and vomiting   Complete by: As directed    Call MD for:  redness, tenderness, or signs of infection (pain, swelling, redness, odor or green/yellow discharge around incision site)   Complete by: As directed    Call MD for:  severe uncontrolled pain   Complete by: As directed    Call MD for:  temperature >100.4   Complete by: As directed    Diet - low sodium heart healthy   Complete by: As directed    Discharge instructions   Complete by: As directed    Call Dr. Alwyn Pea [phone] if you have any emergencies or questions.  Call [place] office if you need to be seen immediately [phone]   Driving Restrictions   Complete by: As directed    No driving for 10 days or while taking percocet   If the dressing is still on your incision site when you go home, remove it on the third day after your surgery date. Remove dressing if it begins to fall off, or if it is dirty or damaged before the third day.   Complete by: As directed    Increase activity slowly   Complete by: As directed    Lifting restrictions   Complete by: As directed    No heavy lifting, nothing heavier than a gallon a milk or approximately 20 pounds  Other Restrictions   Complete by: As directed    No baths, tubs, pools, just showers     Allergies as of 07/23/2020      Reactions   Adhesive [tape]    Tears skin, can tolerate paper tape      Medication List    TAKE these medications   amLODipine 5 MG tablet Commonly known as: NORVASC Take 1 tablet (5 mg total) by mouth daily.   apixaban 5 MG Tabs tablet Commonly known as: ELIQUIS Take 1 tablet (5 mg total) by mouth 2 (two) times daily. Resume when bleeding has stopped in your urine. What changed: additional instructions   carvedilol 25 MG tablet Commonly known as: COREG Take 1 tablet (25 mg total) by mouth 2 (two) times daily with a meal.   dapagliflozin propanediol 10 MG  Tabs tablet Commonly known as: Farxiga Take 1 tablet (10 mg total) by mouth daily before breakfast.   ferrous sulfate 325 (65 FE) MG tablet Take 1 tablet (325 mg total) by mouth daily with breakfast.   glucose blood test strip Use as instructed   hydrALAZINE 100 MG tablet Commonly known as: APRESOLINE Take 1 tablet (100 mg total) by mouth every 8 (eight) hours.   HYDROcodone-acetaminophen 5-325 MG tablet Commonly known as: NORCO/VICODIN Take 1-2 tablets by mouth every 6 (six) hours as needed for moderate pain.   insulin glargine 100 UNIT/ML injection Commonly known as: LANTUS Inject 0.2 mLs (20 Units total) into the skin every morning.   isosorbide mononitrate 60 MG 24 hr tablet Commonly known as: IMDUR Take 1 tablet (60 mg total) by mouth daily.   metolazone 5 MG tablet Commonly known as: ZAROXOLYN Take only as directed by CHF clinic What changed:   how much to take  how to take this  when to take this  reasons to take this  additional instructions   mexiletine 150 MG capsule Commonly known as: MEXITIL Take 2 capsules (300 mg total) by mouth every 12 (twelve) hours.   onetouch ultrasoft lancets Check CBG twice a day   potassium chloride 10 MEQ tablet Commonly known as: KLOR-CON Take 4 tablets (40 mEq total) by mouth daily. What changed:   how much to take  when to take this   senna-docusate 8.6-50 MG tablet Commonly known as: Senokot-S Take 1 tablet by mouth 2 (two) times daily. What changed: when to take this   torsemide 20 MG tablet Commonly known as: DEMADEX Take 2 tablets (40 mg total) by mouth 2 (two) times daily.   traMADol 50 MG tablet Commonly known as: Ultram Take 1-2 tablets (50-100 mg total) by mouth every 6 (six) hours as needed for moderate pain.   WOMENS MULTI PO Take 1 tablet by mouth daily.            Discharge Care Instructions  (From admission, onward)         Start     Ordered   07/23/20 0000  If the dressing is  still on your incision site when you go home, remove it on the third day after your surgery date. Remove dressing if it begins to fall off, or if it is dirty or damaged before the third day.        07/23/20 2197           Signed: Sanjuana Kava 07/23/2020, 9:53 AM

## 2020-07-23 NOTE — Progress Notes (Signed)
2 Days Post-Op Procedure(s): HYSTERECTOMY ABDOMINAL WITH BILATERAL SALPINGO-OOPHORECTOMY  Subjective: Patient reports incisional pain, tolerating PO, + flatus and no problems voiding.    Objective: I have reviewed patient's vital signs, intake and output and labs.  General: alert, cooperative and no distress GI: soft, non-tender; bowel sounds normal; no masses,  no organomegaly and incision: clean, dry and intact Extremities: extremities normal, atraumatic, no cyanosis or edema and Homans sign is negative, no sign of DVT  Assessment: s/p Procedure(s): HYSTERECTOMY ABDOMINAL WITH BILATERAL SALPINGO-OOPHORECTOMY: progressing well  Plan: Discharge home  LOS: 2 days    [facility] 07/23/2020, 9:42 AM

## 2020-07-24 ENCOUNTER — Telehealth: Payer: Self-pay

## 2020-07-24 ENCOUNTER — Telehealth (HOSPITAL_COMMUNITY): Payer: Self-pay

## 2020-07-24 NOTE — Telephone Encounter (Signed)
Transition Care Management Follow-up Telephone Call  Date of discharge and from where: 07/23/2020, [place]  How have you been since you were released from the hospital? She said she is dealing with the pain when she gets up and changes positions.  She noted that she took the pain medication once this morning but has not needed it since then.  Any questions or concerns?  she was concerned about resuming the eliquis. She called the cardiologist and they told her to resume the medication when bleeding has stopped in her urine. This is also noted on her AVS. She said that there is still a little blood in her urine,.  Items Reviewed:  Did the pt receive and understand the discharge instructions provided?  yes  Medications obtained and verified?  she said she has all medications and did not have any questions about her med regime  Any new allergies since your discharge? none reported  Do you have support at home?  yes, she has help at home.  No home health or DME ordered.   Her dressing is intact and she said that she plans to remove it tomorrow as per her discharge   Functional Questionnaire: (I = Independent and D = Dependent) ADLs: independent  Follow up appointments reviewed:   PCP Hospital f/u appt confirmed?.Dr Juleen China - 08/12/2020  Specialist Hospital f/u appt confirmed?OB/GYN - 07/28/2020 for staple removal; OB/GYN for check incision  Are transportation arrangements needed? no  If their condition worsens, is the pt aware to call PCP or go to the Emergency Dept.?yes  Was the patient provided with contact information for the PCP's office or ED?  she has the clinic phone number  Was to pt encouraged to call back with questions or concerns?  yes

## 2020-07-24 NOTE — Telephone Encounter (Signed)
Patient called to see when she could restart her eliquis. I advised patient according to her AVS she could restart it when she stops seeing blood in her urine.

## 2020-07-28 MED FILL — TRUEPLUS SYR 0.5ML 30GX5/16: 30G X 5/16" | 100 days supply | Qty: 100 | Fill #2

## 2020-08-12 ENCOUNTER — Encounter: Payer: Self-pay | Admitting: Internal Medicine

## 2020-08-12 ENCOUNTER — Ambulatory Visit (INDEPENDENT_AMBULATORY_CARE_PROVIDER_SITE_OTHER): Payer: Self-pay | Admitting: Internal Medicine

## 2020-08-12 DIAGNOSIS — D500 Iron deficiency anemia secondary to blood loss (chronic): Secondary | ICD-10-CM

## 2020-08-12 DIAGNOSIS — Z794 Long term (current) use of insulin: Secondary | ICD-10-CM

## 2020-08-12 DIAGNOSIS — E669 Obesity, unspecified: Secondary | ICD-10-CM

## 2020-08-12 DIAGNOSIS — E1165 Type 2 diabetes mellitus with hyperglycemia: Secondary | ICD-10-CM

## 2020-08-12 DIAGNOSIS — I10 Essential (primary) hypertension: Secondary | ICD-10-CM

## 2020-08-12 MED ORDER — FERROUS SULFATE 325 (65 FE) MG PO TABS: 325.0000 mg | ORAL_TABLET | ORAL | 3 refills | Status: DC

## 2020-08-12 NOTE — Progress Notes (Signed)
Virtual Visit via Telephone Note  I connected with Karl Luke, on 08/12/2020 at 8:49 AM by telephone due to the COVID-19 pandemic and verified that I am speaking with the correct person using two identifiers.   Consent: I discussed the limitations, risks, security and privacy concerns of performing an evaluation and management service by telephone and the availability of in person appointments. I also discussed with the patient that there may be a patient responsible charge related to this service. The patient expressed understanding and agreed to proceed.   Location of Patient: Home   Location of Provider: Clinic    Persons participating in Telemedicine visit: Sharunda Salmon [facility] Dr. Juleen China    History of Present Illness: Patient has a visit to follow up on chronic medical conditions. Patient was hospitalized from 8/2-8/4 for TAH. Apparently the fibroid removed was 5lbs.   Diabetes mellitus, Type 2 Disease Monitoring             Blood Sugar Ranges: Fasting - 110s---a couple times per week goes down to low 100s             Polyuria: no             Visual problems: no   Last A1C: 7.0 (July 2021)   Medications: Farxiga 10 mg, Lantus 20u daily  Medication Compliance: yes  Medication Side Effects             Hypoglycemia: no    Past Medical History:  Diagnosis Date   Atrial fibrillation with RVR ([place])    Bigeminy 01/2020   CHF (congestive heart failure) ([place]) 10/20/2019   CKD (chronic kidney disease), stage IV (HCC)    Diabetes mellitus without complication ([place])    DOE (dyspnea on exertion)    walking upstairs or up hill resolves in one minute   Dyspnea    with activity   Fibroids    History of kidney stones    History of recent blood transfusion 02/26/2020   Hypertension    Iron deficiency anemia    Non-ischemic cardiomyopathy (HCC)    tachycardia induced   Obese    Peripheral edema    Premature ventricular contractions (PVCs)  (VPCs)    Umbilical hernia    Umbilical hernia    Wears glasses    Allergies  Allergen Reactions   Adhesive [Tape]     Tears skin, can tolerate paper tape    Current Outpatient Medications on File Prior to Visit  Medication Sig Dispense Refill   amLODipine (NORVASC) 5 MG tablet Take 1 tablet (5 mg total) by mouth daily. 30 tablet 6   apixaban (ELIQUIS) 5 MG TABS tablet Take 1 tablet (5 mg total) by mouth 2 (two) times daily. Resume when bleeding has stopped in your urine. (Patient taking differently: Take 5 mg by mouth 2 (two) times daily. ) 60 tablet 6   carvedilol (COREG) 25 MG tablet Take 1 tablet (25 mg total) by mouth 2 (two) times daily with a meal. 180 tablet 3   dapagliflozin propanediol (FARXIGA) 10 MG TABS tablet Take 1 tablet (10 mg total) by mouth daily before breakfast. 30 tablet 6   ferrous sulfate 325 (65 FE) MG tablet Take 1 tablet (325 mg total) by mouth daily with breakfast. 30 tablet 3   glucose blood test strip Use as instructed 100 each 12   hydrALAZINE (APRESOLINE) 100 MG tablet Take 1 tablet (100 mg total) by mouth every 8 (eight) hours. 90 tablet 6  HYDROcodone-acetaminophen (NORCO/VICODIN) 5-325 MG tablet Take 1-2 tablets by mouth every 6 (six) hours as needed for moderate pain. 40 tablet 0   insulin glargine (LANTUS) 100 UNIT/ML injection Inject 0.2 mLs (20 Units total) into the skin every morning. 10 mL 2   isosorbide mononitrate (IMDUR) 60 MG 24 hr tablet Take 1 tablet (60 mg total) by mouth daily. 30 tablet 6   Lancets (ONETOUCH ULTRASOFT) lancets Check CBG twice a day 60 each 5   metolazone (ZAROXOLYN) 5 MG tablet Take only as directed by CHF clinic (Patient taking differently: Take 5 mg by mouth daily as needed (swelling). ) 10 tablet 0   mexiletine (MEXITIL) 150 MG capsule Take 2 capsules (300 mg total) by mouth every 12 (twelve) hours. 120 capsule 6   Multiple Vitamins-Minerals (WOMENS MULTI PO) Take 1 tablet by mouth daily.      potassium chloride (KLOR-CON) 10 MEQ tablet Take 4 tablets (40 mEq total) by mouth daily. (Patient taking differently: Take 20 mEq by mouth 2 (two) times daily. ) 120 tablet 6   senna-docusate (SENOKOT-S) 8.6-50 MG tablet Take 1 tablet by mouth 2 (two) times daily. 100 tablet 0   torsemide (DEMADEX) 20 MG tablet Take 2 tablets (40 mg total) by mouth 2 (two) times daily. 120 tablet 6   traMADol (ULTRAM) 50 MG tablet Take 1-2 tablets (50-100 mg total) by mouth every 6 (six) hours as needed for moderate pain. 15 tablet 0   No current facility-administered medications on file prior to visit.    Observations/Objective: NAD. Speaking clearly.  Work of breathing normal.  Alert and oriented. Mood appropriate.   Assessment and Plan: 1. Type 2 diabetes mellitus with hyperglycemia, with long-term current use of insulin (HCC) Last A1c 7.0, very close to goal. Patient was unaware of this result as it was obtained for pre-op testing for her hysterectomy. Ultimate goal is to reduce or come off insulin completely. She is working on weight loss. Continue current regimen and plan to repeat A1c in 2 months.   2. Essential hypertension BP well controlled at last office visit. Continue current regimen. Asymptomatic.   3. Class 1 obesity with serious comorbidity in adult, unspecified BMI, unspecified obesity type Patient has lost 15 lbs since surgery. Has been focused on exercise regimen and healthy eating. Provided motivation and support for goals. Discussed healthy pattern of weight loss. Educated that would likely improve her DM and HTN.   4. Iron deficiency anemia due to chronic blood loss HgB 8.6 post operatively. Discussed that every other day Fe dosing leads to same results of rebuilding iron stores with less side effects. Patient would like to limit medication use as much as possible so she will make this dose change.  - ferrous sulfate 325 (65 FE) MG tablet; Take 1 tablet (325 mg total) by mouth every  other day.  Dispense: 30 tablet; Refill: 3   Follow Up Instructions: 2 month f/u DM    I discussed the assessment and treatment plan with the patient. The patient was provided an opportunity to ask questions and all were answered. The patient agreed with the plan and demonstrated an understanding of the instructions.   The patient was advised to call back or seek an in-person evaluation if the symptoms worsen or if the condition fails to improve as anticipated.     I provided 30 minutes total of non-face-to-face time during this encounter including median intraservice time, reviewing previous notes, investigations, ordering medications, medical decision making, coordinating care  and patient verbalized understanding at the end of the visit.    Phill Myron, D.O. [facility]  08/12/2020, 8:49 AM

## 2020-08-13 ENCOUNTER — Ambulatory Visit: Payer: Self-pay | Admitting: Internal Medicine

## 2020-08-27 MED FILL — CARVEDILOL 25 MG TABLET: 25 | 30 days supply | Qty: 60 | Fill #3

## 2020-08-27 MED FILL — TORSEMIDE 20 MG TABLET: 20 | 30 days supply | Qty: 120 | Fill #5

## 2020-08-27 MED FILL — ISOSORBIDE MN ER 60 MG TAB: 60 | 30 days supply | Qty: 30 | Fill #6

## 2020-08-28 MED FILL — MEXILETINE HCL 150 MG CAPS: 150 | 30 days supply | Qty: 120 | Fill #5

## 2020-09-08 MED FILL — LANTUS 100 UNITS/ML VIAL: 100 | 28 days supply | Qty: 10 | Fill #1

## 2020-09-22 MED FILL — FERROUS SULFATE 325 MG TAB: 325 (65 FE) | 30 days supply | Qty: 30 | Fill #3

## 2020-09-22 MED FILL — !ELIQUIS 5MG TABLET: 5 | 30 days supply | Qty: 60 | Fill #4

## 2020-09-22 MED FILL — POTASSIUM CHLORIDE ER 10 ME: 10 | 30 days supply | Qty: 120 | Fill #3

## 2020-09-22 MED FILL — ?AMLODIPINE BESYLATE 5MG TA: 5 | 30 days supply | Qty: 30 | Fill #4

## 2020-09-25 IMAGING — CT CT ABDOMEN W/O CM
2 of 4 series · 15 of 46 positions shown, 17 images · non-contrast
Comparison: 11/07/2019

CLINICAL DATA: evaluate for residual kidney stones. Status post
right percutaneous nephrostomy and left ureteral stent removal.

EXAM:
CT ABDOMEN WITHOUT CONTRAST
TECHNIQUE: Multidetector CT imaging of the abdomen was performed following the
standard protocol without IV contrast.

[Series 2: axial st · axial · 0.78mm/px · z∈[+1470,+1740]mm · 12 of 62 slices shown, 14 images]
[im 4/62  soft-tissue]
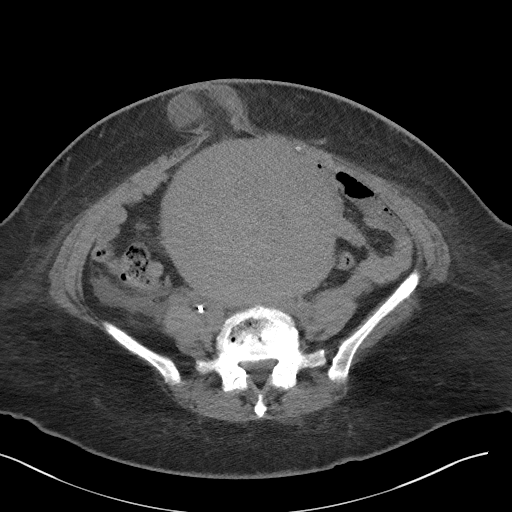
[im 4/62  bone]
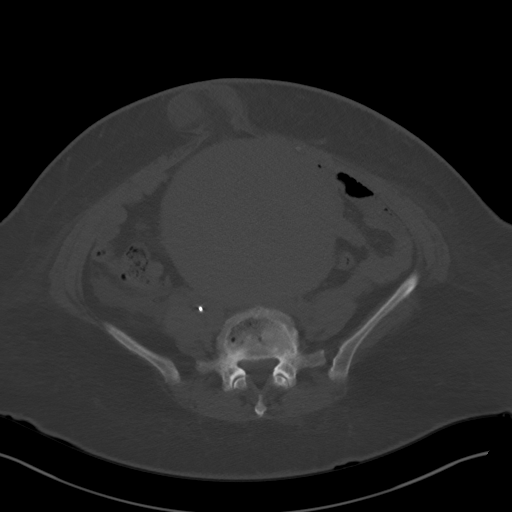
[im 8/62  soft-tissue]
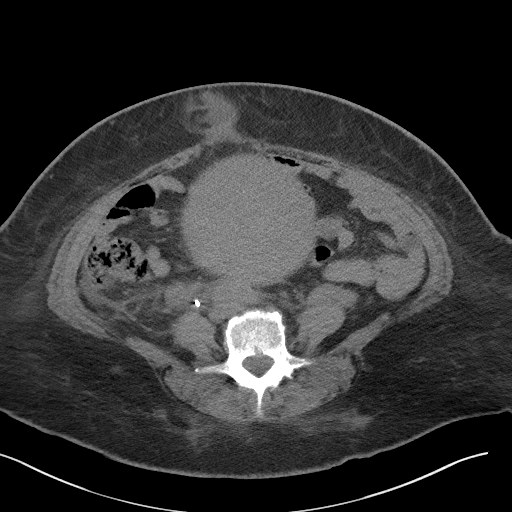
[im 16/62  soft-tissue]
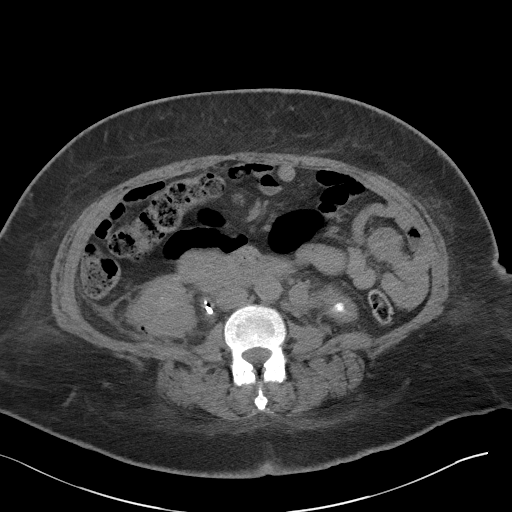
[im 20/62  soft-tissue]
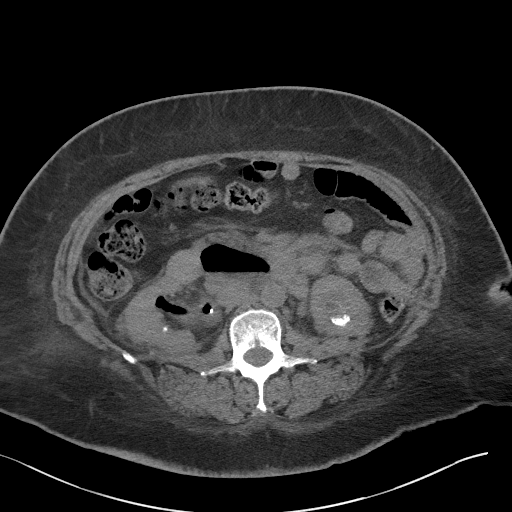
[im 23/62  soft-tissue]
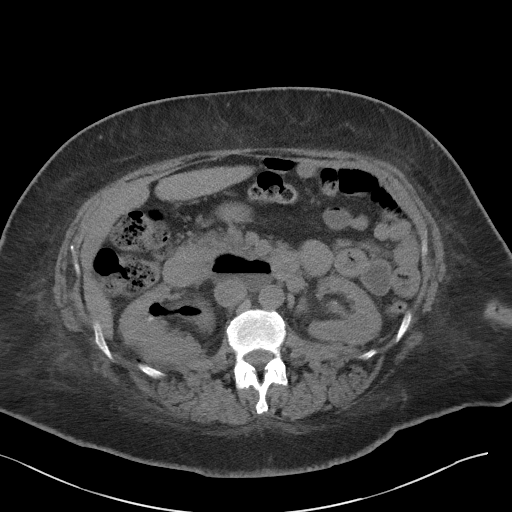
[im 27/62  soft-tissue]
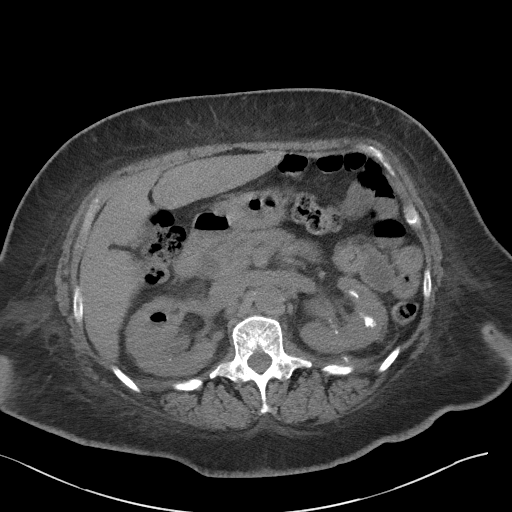
[im 35/62  soft-tissue]
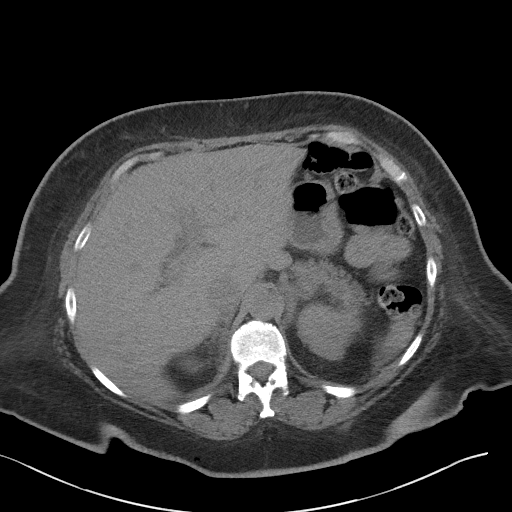
[im 39/62  soft-tissue]
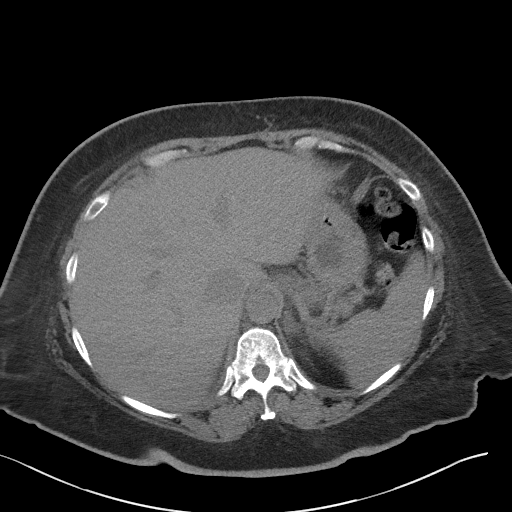
[im 42/62  soft-tissue]
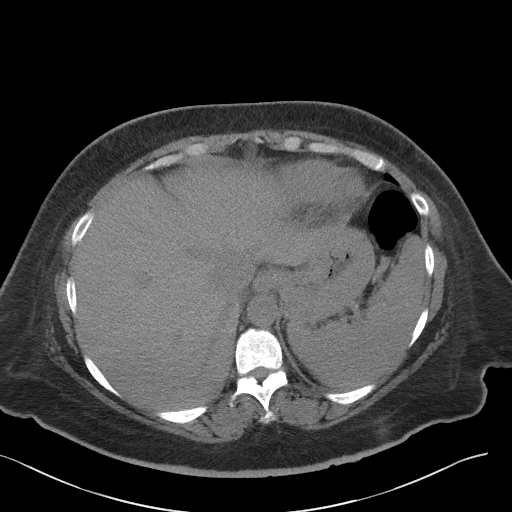
[im 42/62  bone]
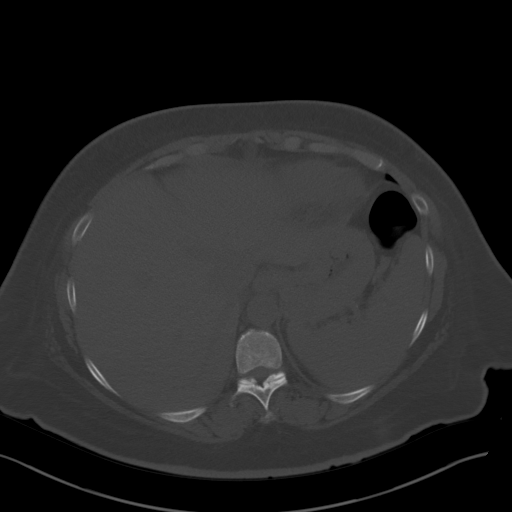
[im 46/62  soft-tissue]
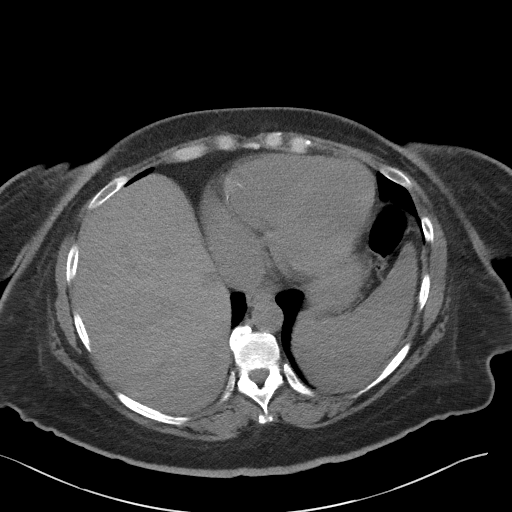
[im 54/62  soft-tissue]
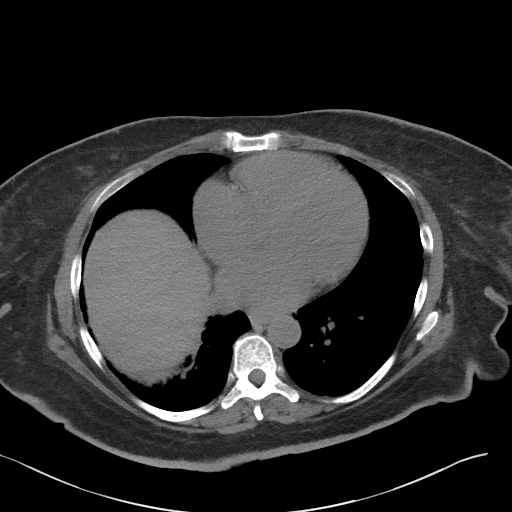
[im 58/62  soft-tissue]
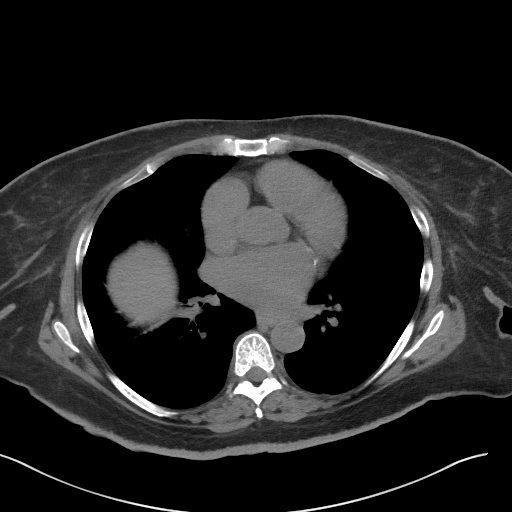

[Series 4: coronal st · coronal · 0.61mm/px · 3 of 101 slices shown]
[im 34/101  soft-tissue]
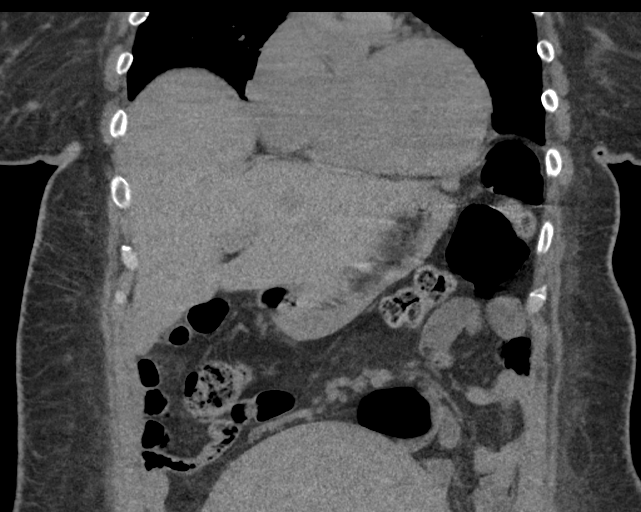
[im 45/101  soft-tissue]
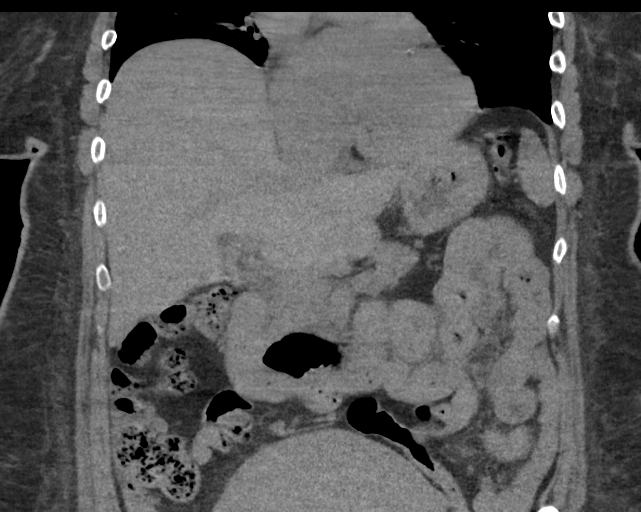
[im 56/101  soft-tissue]
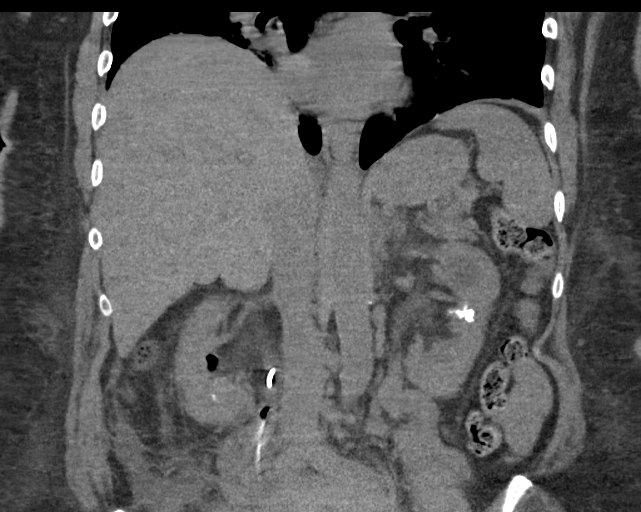

[15 of 46 positions shown; findings below may reference images not displayed]

FINDINGS: Lower chest: Subsegmental atelectasis identified within the lung
bases.

Hepatobiliary: No suspicious liver abnormality. Status post
cholecystectomy. Fusiform dilatation of the CBD is again noted
measuring up to 11 mm.

Pancreas: Unremarkable. No pancreatic ductal dilatation or
surrounding inflammatory changes.

Spleen: Normal in size without focal abnormality.

Adrenals/Urinary Tract: Normal appearance of the adrenal glands.

Decrease in stone burden within the right renal collecting system.
Right-sided nephroureteral stent is identified. The proximal pigtail
is at the level of the right UPJ. Air is identified within the
mildly dilated right renal collecting system. Small perinephric
hematoma noted around the inferior pole of the right kidney
measuring 3.9 x 1.5 cm, image 50/2. There are 5 residual
calcifications identified within the mid and lower pole collecting
system of the right kidney which measure up to 6 mm. Several stone
fragments are identified posterior to the inferior pole of the right
kidney measuring up to 3 mm, image 51/2.

Interval decrease in overall stone burden within the left renal
collecting system. Multiple large stone fragments remain within the
mid and inferior pole collecting systems of the left kidney. The
largest is in the lower pole measuring 1.5 cm, image 59/4. Cluster
of stone fragments measuring up to 1.1 cm noted within the mid
collecting system of the left kidney. Multiple tiny stone fragments
are identified along the previous percutaneous nephrostomy tract,
image 34/2.

Stomach/Bowel: Stomach is within normal limits. Appendix appears
normal. No evidence of bowel wall thickening, distention, or
inflammatory changes.

Vascular/Lymphatic: Mild aortic atherosclerosis without aneurysm.
Small retroperitoneal lymph nodes are identified, likely reactive.

Other: Midline ventral abdominal wall umbilical hernia is identified
which contains fat and fluid. The very large fibroid uterus is again
noted. Free fluid is noted extending along the right pericolic
gutter.

Musculoskeletal: No acute or significant osseous findings.
IMPRESSION: 1. Significant interval decrease in stone burden within both renal
collecting systems.
2. Residual stones are identified within both collecting systems,
left greater than right. Additionally, tiny stone fragments are
noted within the retroperitoneal fat posterior to the inferior pole
of right kidney and along the left sided percutaneous nephrostomy
tract
3. Right-sided nephroureteral stent is in place with proximal
pigtail at the level of the UPJ.
4. Small perinephric hematoma is identified around the inferior pole
of the right kidney.

Aortic Atherosclerosis ([id number]-P59.9).

## 2020-10-23 ENCOUNTER — Ambulatory Visit (INDEPENDENT_AMBULATORY_CARE_PROVIDER_SITE_OTHER): Payer: Self-pay | Admitting: Internal Medicine

## 2020-10-23 ENCOUNTER — Other Ambulatory Visit (HOSPITAL_COMMUNITY): Payer: Self-pay | Admitting: Adult Health

## 2020-10-23 ENCOUNTER — Other Ambulatory Visit: Payer: Self-pay

## 2020-10-23 ENCOUNTER — Encounter: Payer: Self-pay | Admitting: Internal Medicine

## 2020-10-23 VITALS — BP 163/101 | HR 56 | Temp 97.3°F | Resp 17 | Wt 224.0 lb

## 2020-10-23 DIAGNOSIS — I10 Essential (primary) hypertension: Secondary | ICD-10-CM

## 2020-10-23 DIAGNOSIS — Z1329 Encounter for screening for other suspected endocrine disorder: Secondary | ICD-10-CM

## 2020-10-23 DIAGNOSIS — E1165 Type 2 diabetes mellitus with hyperglycemia: Secondary | ICD-10-CM

## 2020-10-23 DIAGNOSIS — Z1159 Encounter for screening for other viral diseases: Secondary | ICD-10-CM

## 2020-10-23 DIAGNOSIS — R5383 Other fatigue: Secondary | ICD-10-CM

## 2020-10-23 DIAGNOSIS — Z794 Long term (current) use of insulin: Secondary | ICD-10-CM

## 2020-10-23 LAB — POCT GLYCOSYLATED HEMOGLOBIN (HGB A1C): Hemoglobin A1C: 8.4 % — AB (ref 4.0–5.6)

## 2020-10-23 LAB — GLUCOSE, POCT (MANUAL RESULT ENTRY): POC Glucose: 165 mg/dl — AB (ref 70–99)

## 2020-10-23 MED FILL — TRUE METRIX TEST STRIP: 25 days supply | Qty: 100 | Fill #2

## 2020-10-23 MED FILL — ISOSORBIDE MN ER 60 MG TAB: 60 | 30 days supply | Qty: 30 | Fill #0

## 2020-10-23 NOTE — Progress Notes (Signed)
Subjective:    Adrienne Lane - 54 y.o. female MRN [id number]  Date of birth: 1966-04-14  HPI  Adrienne Lane is here for follow up of chronic medical conditions.  Also has concerns about fatigue. Says falls asleep in the middle of the day if she doesn't have any appointments or places to be. Will doze off while awaiting things like laundry cycle to be complete and wont even notice that she was falling asleep. Says this has been happening for past year but she initially attributed it to recovering from multiple surgeries. Still going on and is worried. When she does have tasks to complete she is able to stay awake but will feel fatigued. Does not feel well rested in AM. Snores at night. Does not wake up SOB. Does wake up twice per night to go to restroom. No headaches in AM. Has never had sleep study but her cardiologist has recommended it in the past. Has concerns about cost as she is self pay.   Chronic HTN Disease Monitoring:  Home BP Monitoring - Does not have a cuff  Chest pain- no  Dyspnea- no Headache - no  Medications: Amlodipine 5 mg, Coreg 25 mg BID, Hydralazine 100 mg q8h, Imdur 60 mg, Torsemide 40 mg BID  Compliance- yes Lightheadedness- no  Edema- no   Walking daily with her neighbor.   Diabetes mellitus, Type 2 Disease Monitoring             Blood Sugar Ranges: Fasting ->130s (was previously in 110-120s over the summer)              Polyuria: no              Visual problems: no   Urine Microalbumin significantly elevated. [name] could provide renal protective benefit.   Last A1C: 7.0 (July 2021)   Medications: Lantus 20u, Farxiga 10 mg  Medication Compliance: yes  Medication Side Effects             Hypoglycemia: no   Does think hasn't been as adherent with diet as she could be. Able to vocalize that she should limit carbohydrates and focus on lean proteins and veggies in diet.        Health Maintenance:  Health Maintenance Due  Topic Date  Due   Hepatitis C Screening  Never done   COVID-19 Vaccine (1) Never done   INFLUENZA VACCINE  Never done    -  reports that she has never smoked. She has never used smokeless tobacco. - Review of Systems: Per HPI. 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   Gross hematuria 12/18/2019   Staghorn renal calculus 12/18/2019   Chronic kidney disease (CKD), stage IV (severe) ([place]) 24/26/8341   Umbilical hernia without obstruction and without gangrene 12/18/2019   Uterine fibroid 10/25/2019   Right hip pain    Type 2 diabetes mellitus ([place])    Essential hypertension    CHF (congestive heart failure) ([place]) 10/21/2019   Atrial fibrillation with RVR ([place]) 10/21/2019   Atrial fibrillation with rapid ventricular response ([place]) 10/20/2019   - Medications: reviewed and updated   Objective:   Physical  Exam BP (!) 163/101    Pulse (!) 56    Temp (!) 97.3 F (36.3 C) (Temporal)    Resp 17    Wt 224 lb (101.6 kg)    SpO2 98%    BMI 34.06 kg/m  Physical Exam Constitutional:      General: She is not in acute distress.    Appearance: She is not diaphoretic.  Cardiovascular:     Rate and Rhythm: Normal rate.  Pulmonary:     Effort: Pulmonary effort is normal. No respiratory distress.  Musculoskeletal:        General: Normal range of motion.  Skin:    General: Skin is warm and dry.  Neurological:     Mental Status: She is alert and oriented to person, place, and time.  Psychiatric:        Mood and Affect: Affect normal.        Judgment: Judgment normal.            Assessment & Plan:   1.  Type 2 diabetes mellitus with hyperglycemia, with long-term current use of insulin (HCC) A1c worsened from 7.0 to 8.4. Patient believes this is due to her changes in diet. Will trial better diet adherence before making adjustments to regimen. Return in 3 months for monitoring.  Counseled on Diabetic diet, my plate method, 401 minutes of moderate intensity exercise/week Blood sugar logs with fasting goals of 80-120 mg/dl, random of less than 180 and in the event of sugars less than 60 mg/dl or greater than 400 mg/dl encouraged to notify the clinic. Advised on the need for annual eye exams, annual foot exams, Pneumonia vaccine. - Glucose (CBG) - HgB A1c - Lipid Panel - Comprehensive metabolic panel  2. Essential hypertension Repeat BP 150/89, improved but still above goal. Has been on higher dose of Amlodipine in past but unable to tolerate due to peripheral lower extremity edema. Could consider Ace/Arb--patient reports this was discontinued when [name] was started. Given complicated cardiac history, have asked patient to follow up with cardiologist.  - Lipid Panel - Comprehensive metabolic panel - CBC with Differential  3. Need for hepatitis C screening test - HCV Ab w/Rflx to Verification  4. Fatigue, unspecified type Suspect patient would benefit from sleep study given her symptoms along with BMI >30. However, given lack of insurance at present will defer as patient anticipates having coverage in next 1-2 months. In meantime will screen for other potential causes of fatigue. She does have a history of anemia which could be contributing.  - Vitamin D, 25-hydroxy - TSH - Vitamin B12 - CBC with Differential  5. Thyroid disorder screening - TSH    Phill Myron, D.O. 10/23/2020, 2:40 PM Primary Care at [facility]

## 2020-10-24 ENCOUNTER — Other Ambulatory Visit: Payer: Self-pay | Admitting: Internal Medicine

## 2020-10-24 DIAGNOSIS — E785 Hyperlipidemia, unspecified: Secondary | ICD-10-CM

## 2020-10-24 DIAGNOSIS — E559 Vitamin D deficiency, unspecified: Secondary | ICD-10-CM | POA: Insufficient documentation

## 2020-10-24 LAB — CBC WITH DIFFERENTIAL/PLATELET
Basophils Absolute: 0 10*3/uL (ref 0.0–0.2)
Basos: 0 %
EOS (ABSOLUTE): 0.2 10*3/uL (ref 0.0–0.4)
Eos: 3 %
Hematocrit: 38.8 % (ref 34.0–46.6)
Hemoglobin: 12.7 g/dL (ref 11.1–15.9)
Immature Grans (Abs): 0 10*3/uL (ref 0.0–0.1)
Immature Granulocytes: 0 %
Lymphocytes Absolute: 1.1 10*3/uL (ref 0.7–3.1)
Lymphs: 23 %
MCH: 27 pg (ref 26.6–33.0)
MCHC: 32.7 g/dL (ref 31.5–35.7)
MCV: 82 fL (ref 79–97)
Monocytes Absolute: 0.4 10*3/uL (ref 0.1–0.9)
Monocytes: 8 %
Neutrophils Absolute: 3.2 10*3/uL (ref 1.4–7.0)
Neutrophils: 66 %
Platelets: 166 10*3/uL (ref 150–450)
RBC: 4.71 x10E6/uL (ref 3.77–5.28)
RDW: 15.7 % — ABNORMAL HIGH (ref 11.7–15.4)
WBC: 4.9 10*3/uL (ref 3.4–10.8)

## 2020-10-24 LAB — LIPID PANEL
Chol/HDL Ratio: 3.8 ratio (ref 0.0–4.4)
Cholesterol, Total: 283 mg/dL — ABNORMAL HIGH (ref 100–199)
HDL: 75 mg/dL (ref >39)
LDL Chol Calc (NIH): 187 mg/dL — ABNORMAL HIGH (ref 0–99)
Triglycerides: 122 mg/dL (ref 0–149)
VLDL Cholesterol Cal: 21 mg/dL (ref 5–40)

## 2020-10-24 LAB — COMPREHENSIVE METABOLIC PANEL
ALT: 17 IU/L (ref 0–32)
AST: 15 IU/L (ref 0–40)
Albumin/Globulin Ratio: 1.2 (ref 1.2–2.2)
Albumin: 3.9 g/dL (ref 3.8–4.9)
Alkaline Phosphatase: 121 IU/L (ref 44–121)
BUN/Creatinine Ratio: 19 (ref 9–23)
BUN: 30 mg/dL — ABNORMAL HIGH (ref 6–24)
Bilirubin Total: 0.4 mg/dL (ref 0.0–1.2)
CO2: 24 mmol/L (ref 20–29)
Calcium: 9.4 mg/dL (ref 8.7–10.2)
Chloride: 106 mmol/L (ref 96–106)
Creatinine, Ser: 1.55 mg/dL — ABNORMAL HIGH (ref 0.57–1.00)
GFR calc Af Amer: 44 mL/min/{1.73_m2} — ABNORMAL LOW (ref >59)
GFR calc non Af Amer: 38 mL/min/{1.73_m2} — ABNORMAL LOW (ref >59)
Globulin, Total: 3.3 g/dL (ref 1.5–4.5)
Glucose: 173 mg/dL — ABNORMAL HIGH (ref 65–99)
Potassium: 3.2 mmol/L — ABNORMAL LOW (ref 3.5–5.2)
Sodium: 148 mmol/L — ABNORMAL HIGH (ref 134–144)
Total Protein: 7.2 g/dL (ref 6.0–8.5)

## 2020-10-24 LAB — VITAMIN D 25 HYDROXY (VIT D DEFICIENCY, FRACTURES): Vit D, 25-Hydroxy: 21.6 ng/mL — ABNORMAL LOW (ref 30.0–100.0)

## 2020-10-24 LAB — VITAMIN B12: Vitamin B-12: 711 pg/mL (ref 232–1245)

## 2020-10-24 LAB — HCV INTERPRETATION

## 2020-10-24 LAB — TSH: TSH: 1.49 u[IU]/mL (ref 0.450–4.500)

## 2020-10-24 LAB — HCV AB W/RFLX TO VERIFICATION: HCV Ab: 0.2 s/co ratio (ref 0.0–0.9)

## 2020-10-24 MED ORDER — ATORVASTATIN CALCIUM 40 MG PO TABS
40.0000 mg | ORAL_TABLET | Freq: Every day | ORAL | 3 refills | Status: DC
Start: 2020-10-24 — End: 2020-12-24

## 2020-10-24 MED ORDER — VITAMIN D (ERGOCALCIFEROL) 1.25 MG (50000 UNIT) PO CAPS
50000.0000 [IU] | ORAL_CAPSULE | ORAL | 0 refills | Status: DC
Start: 2020-10-24 — End: 2020-12-24

## 2020-10-24 MED FILL — VIT D2 1.25 MG (50,000 UNIT: 1.25 MG | 28 days supply | Qty: 4 | Fill #0

## 2020-10-24 NOTE — Progress Notes (Signed)
Patient notified of results & recommendations. Expressed understanding. States that she is not going to take the Statin but will work on diet & exercise.

## 2020-11-12 MED FILL — LANTUS 100 UNITS/ML VIAL: 100 | 28 days supply | Qty: 10 | Fill #2

## 2020-11-21 ENCOUNTER — Other Ambulatory Visit: Payer: Self-pay

## 2020-11-21 DIAGNOSIS — Z20822 Contact with and (suspected) exposure to covid-19: Secondary | ICD-10-CM

## 2020-11-23 LAB — SARS-COV-2, NAA 2 DAY TAT

## 2020-11-23 LAB — NOVEL CORONAVIRUS, NAA: SARS-CoV-2, NAA: NOT DETECTED

## 2020-11-24 ENCOUNTER — Other Ambulatory Visit (HOSPITAL_COMMUNITY): Payer: Self-pay | Admitting: Adult Health

## 2020-11-24 MED FILL — MEXILETINE 150 MG CAPSULE: 150 | 30 days supply | Qty: 120 | Fill #0

## 2020-11-24 MED FILL — hydrALAZINE HCL 100 MG TABS: 100 | 30 days supply | Qty: 90 | Fill #0

## 2020-11-24 MED FILL — AMLODIPINE BESYLATE 5 MG TA: 5 | 30 days supply | Qty: 30 | Fill #5

## 2020-11-24 MED FILL — TORSEMIDE 20 MG TABLET: 20 | 30 days supply | Qty: 120 | Fill #0

## 2020-11-24 MED FILL — TRUEPLUS SYR 0.5ML 30GX5/16: 30G X 5/16" | 90 days supply | Qty: 100 | Fill #0

## 2020-11-27 MED FILL — ?CARVEDILOL 25 MG TABLET: 25 | 30 days supply | Qty: 60 | Fill #4

## 2020-12-20 DIAGNOSIS — I639 Cerebral infarction, unspecified: Secondary | ICD-10-CM

## 2020-12-20 HISTORY — DX: Cerebral infarction, unspecified: I63.9

## 2020-12-23 ENCOUNTER — Other Ambulatory Visit (HOSPITAL_COMMUNITY): Payer: Self-pay | Admitting: Adult Health

## 2020-12-24 ENCOUNTER — Inpatient Hospital Stay (HOSPITAL_COMMUNITY): Payer: Self-pay

## 2020-12-24 ENCOUNTER — Emergency Department (HOSPITAL_COMMUNITY): Payer: Self-pay

## 2020-12-24 ENCOUNTER — Encounter (HOSPITAL_COMMUNITY): Payer: Self-pay

## 2020-12-24 ENCOUNTER — Inpatient Hospital Stay (HOSPITAL_COMMUNITY)
Admission: EM | Admit: 2020-12-24 | Discharge: 2020-12-27 | DRG: 065 | Disposition: A | Discharge status: Rehabilitation Facility | Payer: Self-pay | Attending: Internal Medicine | Admitting: Internal Medicine

## 2020-12-24 ENCOUNTER — Other Ambulatory Visit: Payer: Self-pay

## 2020-12-24 ENCOUNTER — Other Ambulatory Visit (HOSPITAL_COMMUNITY): Payer: Self-pay | Admitting: Internal Medicine

## 2020-12-24 DIAGNOSIS — I5042 Chronic combined systolic (congestive) and diastolic (congestive) heart failure: Secondary | ICD-10-CM | POA: Diagnosis present

## 2020-12-24 DIAGNOSIS — Z794 Long term (current) use of insulin: Secondary | ICD-10-CM

## 2020-12-24 DIAGNOSIS — E1142 Type 2 diabetes mellitus with diabetic polyneuropathy: Secondary | ICD-10-CM | POA: Diagnosis present

## 2020-12-24 DIAGNOSIS — R29810 Facial weakness: Secondary | ICD-10-CM | POA: Diagnosis present

## 2020-12-24 DIAGNOSIS — I480 Paroxysmal atrial fibrillation: Secondary | ICD-10-CM | POA: Diagnosis present

## 2020-12-24 DIAGNOSIS — Z6834 Body mass index (BMI) 34.0-34.9, adult: Secondary | ICD-10-CM

## 2020-12-24 DIAGNOSIS — I6350 Cerebral infarction due to unspecified occlusion or stenosis of unspecified cerebral artery: Secondary | ICD-10-CM | POA: Diagnosis present

## 2020-12-24 DIAGNOSIS — I69354 Hemiplegia and hemiparesis following cerebral infarction affecting left non-dominant side: Secondary | ICD-10-CM | POA: Insufficient documentation

## 2020-12-24 DIAGNOSIS — I130 Hypertensive heart and chronic kidney disease with heart failure and stage 1 through stage 4 chronic kidney disease, or unspecified chronic kidney disease: Secondary | ICD-10-CM | POA: Diagnosis present

## 2020-12-24 DIAGNOSIS — Z8673 Personal history of transient ischemic attack (TIA), and cerebral infarction without residual deficits: Secondary | ICD-10-CM

## 2020-12-24 DIAGNOSIS — E1122 Type 2 diabetes mellitus with diabetic chronic kidney disease: Secondary | ICD-10-CM | POA: Diagnosis present

## 2020-12-24 DIAGNOSIS — I428 Other cardiomyopathies: Secondary | ICD-10-CM | POA: Diagnosis present

## 2020-12-24 DIAGNOSIS — E785 Hyperlipidemia, unspecified: Secondary | ICD-10-CM | POA: Diagnosis present

## 2020-12-24 DIAGNOSIS — Z79899 Other long term (current) drug therapy: Secondary | ICD-10-CM

## 2020-12-24 DIAGNOSIS — G8194 Hemiplegia, unspecified affecting left nondominant side: Secondary | ICD-10-CM | POA: Diagnosis present

## 2020-12-24 DIAGNOSIS — Z7901 Long term (current) use of anticoagulants: Secondary | ICD-10-CM

## 2020-12-24 DIAGNOSIS — J45909 Unspecified asthma, uncomplicated: Secondary | ICD-10-CM | POA: Diagnosis present

## 2020-12-24 DIAGNOSIS — Z823 Family history of stroke: Secondary | ICD-10-CM

## 2020-12-24 DIAGNOSIS — E119 Type 2 diabetes mellitus without complications: Secondary | ICD-10-CM

## 2020-12-24 DIAGNOSIS — R29703 NIHSS score 3: Secondary | ICD-10-CM | POA: Diagnosis present

## 2020-12-24 DIAGNOSIS — E1165 Type 2 diabetes mellitus with hyperglycemia: Secondary | ICD-10-CM | POA: Diagnosis not present

## 2020-12-24 DIAGNOSIS — E669 Obesity, unspecified: Secondary | ICD-10-CM | POA: Diagnosis present

## 2020-12-24 DIAGNOSIS — I10 Essential (primary) hypertension: Secondary | ICD-10-CM | POA: Diagnosis present

## 2020-12-24 DIAGNOSIS — I6381 Other cerebral infarction due to occlusion or stenosis of small artery: Principal | ICD-10-CM | POA: Diagnosis present

## 2020-12-24 DIAGNOSIS — Z8249 Family history of ischemic heart disease and other diseases of the circulatory system: Secondary | ICD-10-CM

## 2020-12-24 DIAGNOSIS — N1832 Chronic kidney disease, stage 3b: Secondary | ICD-10-CM | POA: Diagnosis present

## 2020-12-24 DIAGNOSIS — Z20822 Contact with and (suspected) exposure to covid-19: Secondary | ICD-10-CM | POA: Diagnosis present

## 2020-12-24 DIAGNOSIS — N183 Chronic kidney disease, stage 3 unspecified: Secondary | ICD-10-CM | POA: Diagnosis present

## 2020-12-24 DIAGNOSIS — N179 Acute kidney failure, unspecified: Secondary | ICD-10-CM

## 2020-12-24 DIAGNOSIS — I639 Cerebral infarction, unspecified: Secondary | ICD-10-CM | POA: Diagnosis present

## 2020-12-24 DIAGNOSIS — Z833 Family history of diabetes mellitus: Secondary | ICD-10-CM

## 2020-12-24 DIAGNOSIS — I2510 Atherosclerotic heart disease of native coronary artery without angina pectoris: Secondary | ICD-10-CM | POA: Diagnosis present

## 2020-12-24 DIAGNOSIS — N184 Chronic kidney disease, stage 4 (severe): Secondary | ICD-10-CM | POA: Diagnosis present

## 2020-12-24 DIAGNOSIS — Z9109 Other allergy status, other than to drugs and biological substances: Secondary | ICD-10-CM

## 2020-12-24 LAB — CBC
HCT: 39.8 % (ref 36.0–46.0)
Hemoglobin: 13 g/dL (ref 12.0–15.0)
MCH: 28.4 pg (ref 26.0–34.0)
MCHC: 32.7 g/dL (ref 30.0–36.0)
MCV: 86.9 fL (ref 80.0–100.0)
Platelets: 208 10*3/uL (ref 150–400)
RBC: 4.58 MIL/uL (ref 3.87–5.11)
RDW: 13.3 % (ref 11.5–15.5)
WBC: 5.4 10*3/uL (ref 4.0–10.5)
nRBC: 0 % (ref 0.0–0.2)

## 2020-12-24 LAB — DIFFERENTIAL
Abs Immature Granulocytes: 0.02 10*3/uL (ref 0.00–0.07)
Basophils Absolute: 0 10*3/uL (ref 0.0–0.1)
Basophils Relative: 0 %
Eosinophils Absolute: 0.1 10*3/uL (ref 0.0–0.5)
Eosinophils Relative: 3 %
Immature Granulocytes: 0 %
Lymphocytes Relative: 16 %
Lymphs Abs: 0.8 10*3/uL (ref 0.7–4.0)
Monocytes Absolute: 0.4 10*3/uL (ref 0.1–1.0)
Monocytes Relative: 7 %
Neutro Abs: 4 10*3/uL (ref 1.7–7.7)
Neutrophils Relative %: 74 %

## 2020-12-24 LAB — COMPREHENSIVE METABOLIC PANEL
ALT: 18 U/L (ref 0–44)
AST: 25 U/L (ref 15–41)
Albumin: 3.1 g/dL — ABNORMAL LOW (ref 3.5–5.0)
Alkaline Phosphatase: 95 U/L (ref 38–126)
Anion gap: 13 (ref 5–15)
BUN: 32 mg/dL — ABNORMAL HIGH (ref 6–20)
CO2: 24 mmol/L (ref 22–32)
Calcium: 9.4 mg/dL (ref 8.9–10.3)
Chloride: 102 mmol/L (ref 98–111)
Creatinine, Ser: 2.12 mg/dL — ABNORMAL HIGH (ref 0.44–1.00)
GFR, Estimated: 28 mL/min — ABNORMAL LOW (ref >60)
Glucose, Bld: 305 mg/dL — ABNORMAL HIGH (ref 70–99)
Potassium: 3.3 mmol/L — ABNORMAL LOW (ref 3.5–5.1)
Sodium: 139 mmol/L (ref 135–145)
Total Bilirubin: 0.6 mg/dL (ref 0.3–1.2)
Total Protein: 7.2 g/dL (ref 6.5–8.1)

## 2020-12-24 LAB — RAPID URINE DRUG SCREEN, HOSP PERFORMED
Amphetamines: NOT DETECTED
Barbiturates: NOT DETECTED
Benzodiazepines: NOT DETECTED
Cocaine: NOT DETECTED
Opiates: NOT DETECTED
Tetrahydrocannabinol: NOT DETECTED

## 2020-12-24 LAB — RESP PANEL BY RT-PCR (FLU A&B, COVID) ARPGX2
Influenza A by PCR: NEGATIVE
Influenza B by PCR: NEGATIVE
SARS Coronavirus 2 by RT PCR: NEGATIVE

## 2020-12-24 LAB — APTT: aPTT: 36 seconds (ref 24–36)

## 2020-12-24 LAB — PROTIME-INR
INR: 1.3 — ABNORMAL HIGH (ref 0.8–1.2)
Prothrombin Time: 15.4 seconds — ABNORMAL HIGH (ref 11.4–15.2)

## 2020-12-24 LAB — I-STAT BETA HCG BLOOD, ED (MC, WL, AP ONLY): I-stat hCG, quantitative: 6 m[IU]/mL — ABNORMAL HIGH (ref <5)

## 2020-12-24 LAB — CBG MONITORING, ED: Glucose-Capillary: 293 mg/dL — ABNORMAL HIGH (ref 70–99)

## 2020-12-24 LAB — GLUCOSE, CAPILLARY: Glucose-Capillary: 240 mg/dL — ABNORMAL HIGH (ref 70–99)

## 2020-12-24 MED ORDER — SODIUM CHLORIDE 0.9 % IV SOLN: INTRAVENOUS | Status: DC

## 2020-12-24 MED ORDER — STROKE: EARLY STAGES OF RECOVERY BOOK
Freq: Once | Status: DC
  Filled 2020-12-24 (×2): qty 1

## 2020-12-24 MED ORDER — ATORVASTATIN CALCIUM 80 MG PO TABS
80.0000 mg | ORAL_TABLET | Freq: Every evening | ORAL | Status: DC
Start: 2020-12-24 — End: 2020-12-27
  Administered 2020-12-24 – 2020-12-26 (×3): 80 mg via ORAL
  Filled 2020-12-24 (×5): qty 8

## 2020-12-24 MED ORDER — ACETAMINOPHEN 650 MG RE SUPP: 650.0000 mg | RECTAL | Status: DC | PRN

## 2020-12-24 MED ORDER — SENNOSIDES-DOCUSATE SODIUM 8.6-50 MG PO TABS: 1.0000 | ORAL_TABLET | Freq: Every evening | ORAL | Status: DC | PRN

## 2020-12-24 MED ORDER — ASPIRIN 81 MG PO CHEW
81.0000 mg | CHEWABLE_TABLET | Freq: Every day | ORAL | Status: DC
  Administered 2020-12-25 – 2020-12-27 (×3): 81 mg via ORAL
  Filled 2020-12-24 (×3): qty 1

## 2020-12-24 MED ORDER — ASPIRIN 325 MG PO TABS: 325.0000 mg | ORAL_TABLET | Freq: Every day | ORAL | Status: DC

## 2020-12-24 MED ORDER — INSULIN GLARGINE 100 UNIT/ML ~~LOC~~ SOLN
20.0000 [IU] | SUBCUTANEOUS | Status: DC
  Administered 2020-12-25 – 2020-12-27 (×3): 20 [IU] via SUBCUTANEOUS
  Filled 2020-12-24 (×4): qty 0.2

## 2020-12-24 MED ORDER — ACETAMINOPHEN 160 MG/5ML PO SOLN: 650.0000 mg | ORAL | Status: DC | PRN

## 2020-12-24 MED ORDER — INSULIN ASPART 100 UNIT/ML ~~LOC~~ SOLN
0.0000 [IU] | Freq: Three times a day (TID) | SUBCUTANEOUS | Status: DC
  Administered 2020-12-25: 5 [IU] via SUBCUTANEOUS
  Administered 2020-12-25 (×2): 2 [IU] via SUBCUTANEOUS
  Administered 2020-12-26: 15 [IU] via SUBCUTANEOUS
  Administered 2020-12-26 (×2): 2 [IU] via SUBCUTANEOUS
  Administered 2020-12-27: 5 [IU] via SUBCUTANEOUS
  Administered 2020-12-27: 3 [IU] via SUBCUTANEOUS
  Administered 2020-12-27: 2 [IU] via SUBCUTANEOUS

## 2020-12-24 MED ORDER — MEXILETINE HCL 150 MG PO CAPS
300.0000 mg | ORAL_CAPSULE | Freq: Two times a day (BID) | ORAL | Status: DC
  Administered 2020-12-24 – 2020-12-27 (×6): 300 mg via ORAL
  Filled 2020-12-24 (×7): qty 2

## 2020-12-24 MED ORDER — ASPIRIN 300 MG RE SUPP: 300.0000 mg | Freq: Every day | RECTAL | Status: DC

## 2020-12-24 MED ORDER — ISOSORBIDE MONONITRATE ER 30 MG PO TB24: 60.0000 mg | ORAL_TABLET | Freq: Every day | ORAL | Status: DC

## 2020-12-24 MED ORDER — ACETAMINOPHEN 325 MG PO TABS: 650.0000 mg | ORAL_TABLET | ORAL | Status: DC | PRN

## 2020-12-24 MED ORDER — SODIUM CHLORIDE 0.9% FLUSH: 3.0000 mL | Freq: Once | INTRAVENOUS | Status: DC

## 2020-12-24 MED ORDER — APIXABAN 5 MG PO TABS
5.0000 mg | ORAL_TABLET | Freq: Two times a day (BID) | ORAL | Status: DC
Start: 2020-12-24 — End: 2020-12-25
  Administered 2020-12-24 – 2020-12-25 (×2): 5 mg via ORAL
  Filled 2020-12-24 (×2): qty 1

## 2020-12-24 NOTE — ED Notes (Signed)
EDP at bedside  

## 2020-12-24 NOTE — ED Provider Notes (Signed)
Medical Screening Exam initiated at 0950. Patient brought by EMS with stroke like symptoms. Per reports LSN was yesterday at 3pm. Having some L sided weakness but can hold arm and leg up in wheelchair with encouragement. No facial droop or slurred speech. Not a candidate for tPA and low suspicion for LVO. Proceed with routine stroke evaluation.    Truddie Hidden, MD 12/24/20 1017

## 2020-12-24 NOTE — ED Triage Notes (Signed)
Pt from home with ems, lsn 3pm yesterday. Husband reports when he saw the pt at 7am this morning she had an abnormal gait and aphasia, this has resolved but pt now has left sided weakness and left sided facial droop. No other neuro symptoms noted.

## 2020-12-24 NOTE — ED Provider Notes (Signed)
[facility] EMERGENCY DEPARTMENT Provider Note   CSN: [id number] Arrival date & time: 12/24/20  1027     History Chief Complaint  Patient presents with  . Stroke Symptoms    Adrienne Lane is a 55 y.o. female with a past medical history of CHF with an EF of 50-55% seen on echo last year, CKD, diabetes, A. fib currently on Eliquis presenting to the ED with a chief complaint of weakness.  Yesterday when she woke up started experiencing floaters in both of her eyes and her peripheral vision.  States that she describes them as "little lines."  When she woke up in the middle of the night to use the bathroom around 2 AM felt like she had weakness in her left lower extremity to try to ambulate.  She went back to sleep, woke up and started having weakness in her left arm.  When she was eating oatmeal she felt like she was drooling from the right side of her mouth.  Denies history of similar symptoms in the past.  States that the floaters have resolved but she did have some blurry vision yesterday.  Denies any loss of vision.  No injuries or falls.  Denies any vomiting, chest pain, shortness of breath, abdominal pain.  No prior stroke.  HPI     Past Medical History:  Diagnosis Date  . Atrial fibrillation with RVR ([place])   . [place] 01/2020  . CHF (congestive heart failure) ([place]) 10/20/2019  . CKD (chronic kidney disease), stage IV ([place])   . Diabetes mellitus without complication ([place])   . DOE (dyspnea on exertion)    walking upstairs or up hill resolves in one minute  . Dyspnea    with activity  . Fibroids   . History of kidney stones   . History of recent blood transfusion 02/26/2020  . Hypertension   . Iron deficiency anemia   . Non-ischemic cardiomyopathy (HCC)    tachycardia induced  . Obese   . Peripheral edema   . Premature ventricular contractions (PVCs) ([facility])   . Umbilical hernia   . Umbilical hernia   . Wears glasses     Patient Active Problem List    Diagnosis Date Noted  . Hyperlipidemia 10/24/2020  . Vitamin D deficiency 10/24/2020  . Fibroids 07/21/2020  . S/P TAH (total abdominal hysterectomy) 07/21/2020  . Chronic a-fib ([place]) 05/23/2020  . Hydroureter on right 05/23/2020  . Microalbuminuria 05/13/2020  . Non-ischemic cardiomyopathy ([place])   . Normocytic anemia   . Nephrolithiasis 02/14/2020  . Paroxysmal atrial fibrillation ([place]) 12/18/2019  . 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 Gross hematuria 12/18/2019  . Staghorn renal calculus 12/18/2019  . Chronic kidney disease (CKD), stage IV (severe) ([place]) 12/18/2019  . Umbilical hernia without obstruction and without gangrene 12/18/2019  . Uterine fibroid 10/25/2019  . Right hip pain   . Type 2 diabetes mellitus ([place])   . Essential hypertension   . CHF (congestive heart failure) ([place]) 10/21/2019  . Atrial fibrillation with RVR ([place]) 10/21/2019  . Atrial fibrillation with rapid ventricular response ([place]) 10/20/2019    Past Surgical History:  Procedure Laterality Date  . CHOLECYSTECTOMY    . CYSTOSCOPY W/ URETERAL STENT PLACEMENT Bilateral 05/24/2020   Procedure: CYSTOSCOPY WITH RETROGRADE PYELOGRAM/URETERAL STENT PLACEMENT;  Surgeon: Robley Fries, MD;  Location: [facility];  Service: Urology;  Laterality: Bilateral;  . CYSTOSCOPY/RETROGRADE/URETEROSCOPY Bilateral 04/03/2020   Procedure: CYSTOSCOPY/RETROGRADE/URETEROSCOPY;  Surgeon: Ardis Hughs, MD;  Location: [facility];  Service: Urology;  Laterality: Bilateral;  . HYSTERECTOMY ABDOMINAL WITH SALPINGO-OOPHORECTOMY  07/21/2020   Procedure: HYSTERECTOMY ABDOMINAL WITH BILATERAL SALPINGO-OOPHORECTOMY;  Surgeon: Sanjuana Kava, MD;  Location: [facility];  Service: Gynecology;;  . IR FLUORO GUIDE CV LINE RIGHT  02/21/2020  . IR US GUIDE VASC ACCESS RIGHT  02/21/2020  . NEPHROLITHOTOMY Left 02/14/2020   Procedure: NEPHROLITHOTOMY PERCUTANEOUS/ SURGEON ACCESS/ LEFT PERCUTANEOUS NEPHROSTOMY TUBE PLACEMENT;  Surgeon: Ardis Hughs, MD;  Location: [facility];  Service: Urology;  Laterality: Left;  . NEPHROLITHOTOMY Left 02/21/2020   Procedure: NEPHROLITHOTOMY PERCUTANEOUS SECOND LOOK;  Surgeon: Ardis Hughs, MD;  [facility];  Service: Urology;  Laterality: Left;  . NEPHROLITHOTOMY Left 02/26/2020   Procedure: NEPHROLITHOTOMY PERCUTANEOUS;  Surgeon: Ceasar Mons, MD;  Location[facility];  Service: Urology;  Laterality: Left;  NEED 150 MIN  . NEPHROLITHOTOMY Right 03/24/2020   Procedure: NEPHROLITHOTOMY PERCUTANEOUS WITH ACCESS LEFT STENT REMOVAL;  Surgeon: Ardis Hughs, MD;  [facility];  Service: Urology;  Laterality: Right;  . RIGHT HEART CATH N/A 11/09/2019   Procedure: RIGHT HEART CATH;  Surgeon: Jolaine Artist, MD;  Location: [facility];  Service: Cardiovascular;  Laterality: N/A;  . WISDOM TOOTH EXTRACTION       OB History    Gravida  0   Para  0   Term  0   Preterm  0   AB  0   Living  0     SAB  0   IAB  0   Ectopic  0   Multiple  0   Live Births  0           Family History  Problem Relation Age of Onset  . Atrial fibrillation Mother   . Hypertension Mother   . Stroke Mother   . Diabetes Mother   . Diabetes Father   . Hypertension Father   . Diabetes Sister   . Hypertension Sister   . Diabetes Brother   . Hypertension Brother   . Diabetes Brother   . Heart attack Brother     Social History   Tobacco Use  . Smoking status: Never Smoker  . Smokeless tobacco: Never Used  Vaping Use  . Vaping Use: Never used  Substance Use Topics  . Alcohol use: Not Currently  . Drug use: Never    Home Medications Prior to Admission medications   Medication Sig Start Date End Date Taking? Authorizing Provider  amLODipine (NORVASC) 5 MG tablet Take 1 tablet (5 mg total) by mouth daily. 07/10/20   Charlott Rakes, MD  atorvastatin (LIPITOR) 40 MG tablet Take 1 tablet (40 mg total) by mouth daily. 10/24/20   Nicolette Bang, DO  carvedilol (COREG) 25 MG tablet  Take 1 tablet (25 mg total) by mouth 2 (two) times daily with a meal. 12/31/19   Clegg, Amy D, NP  dapagliflozin propanediol (FARXIGA) 10 MG TABS tablet Take 1 tablet (10 mg total) by mouth daily before breakfast. 06/02/20   Bensimhon, Shaune Pascal, MD  ELIQUIS 5 MG TABS tablet TAKE 1 TABLET (5 MG TOTAL) BY MOUTH 2 (TWO) TIMES DAILY. 12/24/20   Bensimhon, Shaune Pascal, MD  glucose blood test strip Use as instructed 11/19/19   Clegg, Amy D, NP  hydrALAZINE (APRESOLINE) 100 MG tablet TAKE 1 TABLET (100 MG TOTAL) BY MOUTH EVERY 8 (EIGHT) HOURS. 11/24/20   Clegg, Amy D, NP  insulin glargine (LANTUS) 100 UNIT/ML injection Inject 0.2 mLs (20 Units total) into the skin  every morning. 06/10/20   Charlott Rakes, MD  isosorbide mononitrate (IMDUR) 60 MG 24 hr tablet TAKE 1 TABLET (60 MG TOTAL) BY MOUTH DAILY. 10/23/20   Clegg, Amy D, NP  Lancets (ONETOUCH ULTRASOFT) lancets Check CBG twice a day 11/12/19   Clegg, Amy D, NP  mexiletine (MEXITIL) 150 MG capsule TAKE 2 CAPSULES (300 MG TOTAL) BY MOUTH EVERY 12 (TWELVE) HOURS. 11/24/20   Clegg, Amy D, NP  Multiple Vitamins-Minerals (WOMENS MULTI PO) Take 1 tablet by mouth daily.    [provider]  potassium chloride (KLOR-CON) 10 MEQ tablet Take 4 tablets (40 mEq total) by mouth daily. Patient taking differently: Take 20 mEq by mouth 2 (two) times daily.  02/04/20   Bensimhon, Shaune Pascal, MD  torsemide (DEMADEX) 20 MG tablet TAKE 2 TABLETS (40 MG TOTAL) BY MOUTH 2 (TWO) TIMES DAILY. 11/24/20   Clegg, Amy D, NP  TRUEPLUS INSULIN SYRINGE 30G X 5/16" 0.5 ML MISC USE TO INJECT 12 UNITS AT BEDTIME. 11/24/20   Clegg, Amy D, NP  Vitamin D, Ergocalciferol, (DRISDOL) 1.25 MG (50000 UNIT) CAPS capsule Take 1 capsule (50,000 Units total) by mouth every 7 (seven) days. 10/24/20   Nicolette Bang, DO    Allergies    Adhesive [tape]  Review of Systems   Review of Systems  Constitutional: Negative for appetite change, chills and fever.  HENT: Negative for ear pain,  rhinorrhea, sneezing and sore throat.   Eyes: Negative for photophobia and visual disturbance.  Respiratory: Negative for cough, chest tightness, shortness of breath and wheezing.   Cardiovascular: Negative for chest pain and palpitations.  Gastrointestinal: Negative for abdominal pain, blood in stool, constipation, diarrhea, nausea and vomiting.  Genitourinary: Negative for dysuria, hematuria and urgency.  Musculoskeletal: Negative for myalgias.  Skin: Negative for rash.  Neurological: Positive for facial asymmetry and weakness. Negative for dizziness and light-headedness.    Physical Exam Updated Vital Signs BP (!) 144/90 (BP Location: Right Arm)   Pulse 60   Temp 97.8 F (36.6 C) (Oral)   Resp 16   SpO2 100%   Physical Exam Vitals and nursing note reviewed.  Constitutional:      General: She is not in acute distress.    Appearance: She is well-developed and well-nourished.  HENT:     Head: Normocephalic and atraumatic.     Nose: Nose normal.  Eyes:     General: No scleral icterus.       Right eye: No discharge.        Left eye: No discharge.     Extraocular Movements: EOM normal.     Conjunctiva/sclera: Conjunctivae normal.     Pupils: Pupils are equal, round, and reactive to light.  Neck:     Comments: No neck stiffness noted. Cardiovascular:     Rate and Rhythm: Normal rate and regular rhythm.     Pulses: Intact distal pulses.     Heart sounds: Normal heart sounds. No murmur heard. No friction rub. No gallop.   Pulmonary:     Effort: Pulmonary effort is normal. No respiratory distress.     Breath sounds: Normal breath sounds.  Abdominal:     General: Bowel sounds are normal. There is no distension.     Palpations: Abdomen is soft.     Tenderness: There is no abdominal tenderness. There is no guarding.  Musculoskeletal:        General: No edema. Normal range of motion.     Cervical back: Normal range of  motion and neck supple.  Skin:    General: Skin is warm  and dry.     Findings: No rash.  Neurological:     Mental Status: She is alert and oriented to person, place, and time.     Cranial Nerves: Cranial nerve deficit present.     Sensory: No sensory deficit.     Motor: Weakness (Decreased strength in left lower and left upper extremity) present. No abnormal muscle tone.     Coordination: Coordination normal.     Comments: Normal sensation to light touch of bilateral upper and lower extremities.  Slight left-sided facial droop.  No aphasia.  No visual field deficit.  Psychiatric:        Mood and Affect: Mood and affect normal.     ED Results / Procedures / Treatments   Labs (all labs ordered are listed, but only abnormal results are displayed) Labs Reviewed  PROTIME-INR - Abnormal; Notable for the following components:      Result Value   Prothrombin Time 15.4 (*)    INR 1.3 (*)    All other components within normal limits  COMPREHENSIVE METABOLIC PANEL - Abnormal; Notable for the following components:   Potassium 3.3 (*)    Glucose, Bld 305 (*)    BUN 32 (*)    Creatinine, Ser 2.12 (*)    Albumin 3.1 (*)    GFR, Estimated 28 (*)    All other components within normal limits  CBG MONITORING, ED - Abnormal; Notable for the following components:   Glucose-Capillary 293 (*)    All other components within normal limits  I-STAT BETA HCG BLOOD, ED ([facility], [facility], [facility] ONLY) - Abnormal; Notable for the following components:   I-stat hCG, quantitative 6.0 (*)    All other components within normal limits  RESP PANEL BY RT-PCR (FLU A&B, COVID) ARPGX2  APTT  CBC  DIFFERENTIAL    EKG EKG Interpretation  Date/Time:  Wednesday December 24 2020 10:05:10 EST Ventricular Rate:  77 PR Interval:    QRS Duration: 96 QT Interval:  406 QTC Calculation: 459 R Axis:   -21 Text Interpretation: Sinus rhythm with occasional Premature ventricular complexes Left ventricular hypertrophy with repolarization abnormality ( R in aVL , Cornell product ) Abnormal  ECG No significant change since last tracing Confirmed by Calvert Cantor [phone]) on 12/24/2020 11:13:13 AM   Radiology CT HEAD WO CONTRAST  Result Date: 12/24/2020 CLINICAL DATA:  Neuro deficit, acute stroke suspected. Abnormal gait and aphasia stray. Today's having left-sided weakness. EXAM: CT HEAD WITHOUT CONTRAST TECHNIQUE: Contiguous axial images were obtained from the base of the skull through the vertex without intravenous contrast. COMPARISON:  None. FINDINGS: Brain: Small area of hypoattenuation in the left periatrial white matter (see series 5, image 44 and series 3, image 15). Additional small area of hypoattenuation in the right corona radiata. No acute hemorrhage. No hydrocephalus. No mass lesion. No abnormal mass effect. No midline shift. No extra-axial fluid collection. Vascular: No hyperdense vessel identified. Calcific atherosclerosis. Skull: No acute fracture. Punctate foreign body in the subcutaneous high posterior left scalp without soft tissue swelling. Sinuses/Orbits: No acute finding. Other: No mastoid effusions. IMPRESSION: 1. Small area of hypoattenuation in the left periatrial white matter could represent chronic microvascular ischemic disease or age indeterminate lacunar infarct. Additional age indeterminate lacunar infarct within the right corona radiata, favored remote. If there is concern for acute infarct, MRI could further evaluate. 2. No acute hemorrhage. 3. Punctate foreign body in the subcutaneous  high posterior left scalp, likely remote given no scalp swelling. Electronically Signed   By: Margaretha Sheffield MD   On: 12/24/2020 12:30   MR BRAIN WO CONTRAST  Result Date: 12/24/2020 CLINICAL DATA:  Neuro deficit, acute stroke suspected. EXAM: MRI HEAD WITHOUT CONTRAST TECHNIQUE: Multiplanar, multiecho pulse sequences of the brain and surrounding structures were obtained without intravenous contrast. COMPARISON:  Same day head CT. FINDINGS: Brain: Acute infarct within the right  hemi pons. Mild associated edema without substantial mass effect. Small acute infarct in the left frontal subcortical white matter (see series 2 and 250, image 29) without associated edema. Small remote lacunar infarcts in the right corona radiata and left periatrial white matter. No hydrocephalus. No acute hemorrhage. No mass lesion. No extra-axial fluid collection. Vascular: Major arterial flow voids are maintained at the skull base. Skull and upper cervical spine: Normal marrow signal. Sinuses/Orbits: Sinuses are clear.  Unremarkable orbits. IMPRESSION: 1. Acute infarct in the right hemipons. Additional small acute infarct in the left frontal subcortical white matter. 2. Mild associated edema in the right pons without substantial mass effect. 3. Small remote lacunar infarcts in the right corona radiata and left periatrial white matter. Findings discussed with Dr. Shelly Coss via telephone at 2:02 PM. Electronically Signed   By: Margaretha Sheffield MD   On: 12/24/2020 14:05    Procedures Procedures (including critical care time)  Medications Ordered in ED Medications  sodium chloride flush (NS) 0.9 % injection 3 mL (0 mLs Intravenous Hold 12/24/20 1118)    ED Course  I have reviewed the triage vital signs and the nursing notes.  Pertinent labs & imaging results that were available during my care of the patient were reviewed by me and considered in my medical decision making (see chart for details).  Clinical Course as of 12/24/20 1414  Wed Dec 24, 2020  1240 CT HEAD WO CONTRAST Possible age-indeterminate lacunar infarct seen.  Will order MRI of the brain to further characterize. [HK]  1337 Creatinine(!): 2.12 Around baseline. [HK]  9371 MRI shows a right-sided pontine infarct.  We will get patient admitted for stroke work-up and consult neurology. [HK]    Clinical Course User Index [HK] Delia Heady, PA-C   MDM Rules/Calculators/A&P                          55 year old female presenting to the  ED with a chief complaint of weakness.  She has a history of CHF with an EF of 50 to 55%, CKD, diabetes, and A. fib currently on Eliquis.  Yesterday started experiencing floaters in the peripheral vision of both of her eyes.  She woke up in the middle the night to use the bathroom around 2 AM felt like she had weakness in her left lower extremity with ambulation.  She went back to sleep and woke up with weakness in her left extremity.  She is also concerned that she was drooling from the right side of her mouth while she was eating.  No prior strokes.  Her vision changes have resolved.  No loss of vision, chest pain, vomiting or shortness of breath.  On exam patient does have some decreased strength in the left upper and lower extremities and a slight left-sided facial droop.  She denies any headache.  She is in no acute distress.  No changes to speech noted.  She is alert and oriented x4.  Lab work here is unremarkable.  EKG shows sinus rhythm,  no significant changes from prior tracings.  CT with possible age-indeterminate loop Kuhner infarct.  An MRI was obtained which showed a right sided pontine infarct with a small remote lacunar infarct in the right corona radiata.  Patient will need to be admitted for stroke work-up.  Will consult neurology.   Portions of this note were generated with [employer]obbyist. Dictation errors may occur despite best attempts at proofreading.  Final Clinical Impression(s) / ED Diagnoses Final diagnoses:  Cerebrovascular accident (CVA), unspecified mechanism [facility])    Rx / [place] Orders ED Discharge Orders    None       Delia Heady, PA-C 12/24/20 1420    Truddie Hidden, MD 12/24/20 1450

## 2020-12-24 NOTE — H&P (Signed)
History and Physical    Adrienne Lane HOZ:[id number] DOB: [birth year] DOA: 12/24/2020  PCP: Nicolette Bang, DO Consultants:  Alexander; N[place] - cardiology; Louis Meckel - urology Patient coming from: Home - lives with husband and mother-in-law; NOK: [place], [place], [phone]  Chief Complaint: Stroke symptoms  HPI: Adrienne Lane is a 55 y.o. female with medical history significant of obesity; DM; afib; stage IV CKD; nephrolithiasis; and chronic systolic CHF presenting with stroke symptoms.  She reports that she couldn't control her left side.  She noticed it in the middle of the night and then noticed it more this AM.  Her left leg got weak this AM.  +L facial droop.  +drooling, no trouble swallowing.  Her speech sounds a bit slurred and slower.     ED Course:  Need stroke evaluation - vision issues yesterday.  Overnight with LLE weakness.  Then LUE weakness.  MRI with pontine infarct.  Neurology consult pending.  Review of Systems: As per HPI; otherwise review of systems reviewed and negative.   Ambulatory Status:  Ambulates without assistance  COVID Vaccine Status:  First shot of Moderna - 2nd shot is due on 1/20  Past Medical History:  Diagnosis Date  . Atrial fibrillation with RVR ([place])   . [place] 01/2020  . CHF (congestive heart failure) ([place]) 10/20/2019  . CKD (chronic kidney disease), stage IV ([place])   . Diabetes mellitus without complication ([place])   . DOE (dyspnea on exertion)    walking upstairs or up hill resolves in one minute  . Fibroids   . History of kidney stones   . History of recent blood transfusion 02/26/2020  . Hypertension   . Iron deficiency anemia   . Non-ischemic cardiomyopathy (HCC)    tachycardia induced  . Obese   . Peripheral edema   . Premature ventricular contractions (PVCs) ([facility])   . Umbilical hernia   . Wears glasses     Past Surgical History:  Procedure Laterality Date  . CHOLECYSTECTOMY    . CYSTOSCOPY W/  URETERAL STENT PLACEMENT Bilateral 05/24/2020   Procedure: CYSTOSCOPY WITH RETROGRADE PYELOGRAM/URETERAL STENT PLACEMENT;  Surgeon: Robley Fries, MD;  Location: [facility];  Service: Urology;  Laterality: Bilateral;  . CYSTOSCOPY/RETROGRADE/URETEROSCOPY Bilateral 04/03/2020   Procedure: CYSTOSCOPY/RETROGRADE/URETEROSCOPY;  Surgeon: Ardis Hughs, MD;  Location: [facility];  Service: Urology;  Laterality: Bilateral;  . HYSTERECTOMY ABDOMINAL WITH SALPINGO-OOPHORECTOMY  07/21/2020   Procedure: HYSTERECTOMY ABDOMINAL WITH BILATERAL SALPINGO-OOPHORECTOMY;  Surgeon: Sanjuana Kava, MD;  Location: [place] [facility];  Service: Gynecology;;  . IR FLUORO GUIDE CV LINE RIGHT  02/21/2020  . IR US GUIDE VASC ACCESS RIGHT  02/21/2020  . NEPHROLITHOTOMY Left 02/14/2020   Procedure: NEPHROLITHOTOMY PERCUTANEOUS/ SURGEON ACCESS/ LEFT PERCUTANEOUS NEPHROSTOMY TUBE PLACEMENT;  Surgeon: Ardis Hughs, MD;  Location: [facility];  Service: Urology;  Laterality: Left;  . NEPHROLITHOTOMY Left 02/21/2020   Procedure: NEPHROLITHOTOMY PERCUTANEOUS SECOND LOOK;  Surgeon: Ardis Hughs, MD;  Location: [facility];  Service: Urology;  Laterality: Left;  . NEPHROLITHOTOMY Left 02/26/2020   Procedure: NEPHROLITHOTOMY PERCUTANEOUS;  Surgeon: Ceasar Mons, MD;  Location: [facility];  Service: Urology;  Laterality: Left;  NEED 150 MIN  . NEPHROLITHOTOMY Right 03/24/2020   Procedure: NEPHROLITHOTOMY PERCUTANEOUS WITH ACCESS LEFT STENT REMOVAL;  Surgeon: Ardis Hughs, MD;  Location: [facility];  Service: Urology;  Laterality: Right;  . RIGHT HEART CATH N/A 11/09/2019   Procedure: RIGHT HEART CATH;  Surgeon: Jolaine Artist, MD;  Location: [facility];  Service: Cardiovascular;  Laterality: N/A;  . WISDOM TOOTH EXTRACTION      Social History   Socioeconomic History  . Marital status: Married    Spouse name: Not on file  . Number of children: 0  . Years of education: Not on file  . Highest education level: Associate degree:  occupational, Hotel manager, or vocational program  Occupational History  . Occupation: unemployed  Tobacco Use  . Smoking status: Never Smoker  . Smokeless tobacco: Never Used  Vaping Use  . Vaping Use: Never used  Substance and Sexual Activity  . Alcohol use: Never  . Drug use: Never  . Sexual activity: Yes    Birth control/protection: None  Other Topics Concern  . Not on file  Social History Narrative  . Not on file   Social Determinants of Health   Financial Resource Strain: Not on file  Food Insecurity: Not on file  Transportation Needs: No Transportation Needs  . Lack of Transportation (Medical): No  . Lack of Transportation (Non-Medical): No  Physical Activity: Not on file  Stress: Not on file  Social Connections: Not on file  Intimate Partner Violence: Not on file    Allergies  Allergen Reactions  . Adhesive [Tape]     Tears skin, can tolerate paper tape    Family History  Problem Relation Age of Onset  . Atrial fibrillation Mother   . Hypertension Mother   . Stroke Mother 13  . Diabetes Mother   . Diabetes Father   . Hypertension Father   . Diabetes Sister   . Hypertension Sister   . Diabetes Brother   . Hypertension Brother   . Diabetes Brother   . Heart attack Brother   . Stroke Maternal Grandmother 45    Prior to Admission medications   Medication Sig Start Date End Date Taking? Authorizing Provider  amLODipine (NORVASC) 5 MG tablet Take 1 tablet (5 mg total) by mouth daily. 07/10/20   Charlott Rakes, MD  atorvastatin (LIPITOR) 40 MG tablet Take 1 tablet (40 mg total) by mouth daily. 10/24/20   Nicolette Bang, DO  carvedilol (COREG) 25 MG tablet Take 1 tablet (25 mg total) by mouth 2 (two) times daily with a meal. 12/31/19   Clegg, Amy D, NP  dapagliflozin propanediol (FARXIGA) 10 MG TABS tablet Take 1 tablet (10 mg total) by mouth daily before breakfast. 06/02/20   Bensimhon, Shaune Pascal, MD  ELIQUIS 5 MG TABS tablet TAKE 1 TABLET (5 MG TOTAL)  BY MOUTH 2 (TWO) TIMES DAILY. 12/24/20   Bensimhon, Shaune Pascal, MD  glucose blood test strip Use as instructed 11/19/19   Clegg, Amy D, NP  hydrALAZINE (APRESOLINE) 100 MG tablet TAKE 1 TABLET (100 MG TOTAL) BY MOUTH EVERY 8 (EIGHT) HOURS. 11/24/20   Clegg, Amy D, NP  insulin glargine (LANTUS) 100 UNIT/ML injection Inject 0.2 mLs (20 Units total) into the skin every morning. 06/10/20   Charlott Rakes, MD  isosorbide mononitrate (IMDUR) 60 MG 24 hr tablet TAKE 1 TABLET (60 MG TOTAL) BY MOUTH DAILY. 10/23/20   Clegg, Amy D, NP  Lancets (ONETOUCH ULTRASOFT) lancets Check CBG twice a day 11/12/19   Clegg, Amy D, NP  mexiletine (MEXITIL) 150 MG capsule TAKE 2 CAPSULES (300 MG TOTAL) BY MOUTH EVERY 12 (TWELVE) HOURS. 11/24/20   Clegg, Amy D, NP  Multiple Vitamins-Minerals (WOMENS MULTI PO) Take 1 tablet by mouth daily.    [provider]  potassium chloride (KLOR-CON) 10 MEQ tablet Take  4 tablets (40 mEq total) by mouth daily. Patient taking differently: Take 20 mEq by mouth 2 (two) times daily.  02/04/20   Bensimhon, Shaune Pascal, MD  torsemide (DEMADEX) 20 MG tablet TAKE 2 TABLETS (40 MG TOTAL) BY MOUTH 2 (TWO) TIMES DAILY. 11/24/20   Clegg, Amy D, NP  TRUEPLUS INSULIN SYRINGE 30G X 5/16" 0.5 ML MISC USE TO INJECT 12 UNITS AT BEDTIME. 11/24/20   Clegg, Amy D, NP  Vitamin D, Ergocalciferol, (DRISDOL) 1.25 MG (50000 UNIT) CAPS capsule Take 1 capsule (50,000 Units total) by mouth every 7 (seven) days. 10/24/20   Nicolette Bang, DO    Physical Exam: Vitals:   12/24/20 1645 12/24/20 1715 12/24/20 1730 12/24/20 1745  BP: (!) 141/93 (!) 156/84 139/78   Pulse: (!) 51 (!) 58 (!) 41 (!) 51  Resp: 16 17 19 16   Temp:      TempSrc:      SpO2: 99% 100% 100% 99%     . General:  Appears calm and comfortable and is in NAD . Eyes:  PERRL, EOMI, normal lids, iris . ENT:  grossly normal hearing, lips & tongue, mmm; appropriate dentition . Neck:  no LAD, masses or thyromegaly; no carotid  bruits . Cardiovascular:  RRR, no m/r/g. No LE edema.  Marland Kitchen Respiratory:   CTA bilaterally with no wheezes/rales/rhonchi.  Normal respiratory effort. . Abdomen:  soft, NT, ND, NABS . Skin:  no rash or induration seen on limited exam . Musculoskeletal:  LLE/LUE weakness, 4/5 strength, no bony abnormality . Lower extremity:  No LE edema.  Limited foot exam with no ulcerations.  2+ distal pulses. Marland Kitchen Psychiatric:  grossly normal mood and affect, speech mildly slow but appropriate with subtle dysarthria, AOx3 . Neurologic:  CN 2-12 grossly intact, moves all extremities in coordinated fashion with L hemiparesis, sensation intact    Radiological Exams on Admission: Independently reviewed - see discussion in A/P where applicable  CT HEAD WO CONTRAST  Result Date: 12/24/2020 CLINICAL DATA:  Neuro deficit, acute stroke suspected. Abnormal gait and aphasia stray. Today's having left-sided weakness. EXAM: CT HEAD WITHOUT CONTRAST TECHNIQUE: Contiguous axial images were obtained from the base of the skull through the vertex without intravenous contrast. COMPARISON:  None. FINDINGS: Brain: Small area of hypoattenuation in the left periatrial white matter (see series 5, image 44 and series 3, image 15). Additional small area of hypoattenuation in the right corona radiata. No acute hemorrhage. No hydrocephalus. No mass lesion. No abnormal mass effect. No midline shift. No extra-axial fluid collection. Vascular: No hyperdense vessel identified. Calcific atherosclerosis. Skull: No acute fracture. Punctate foreign body in the subcutaneous high posterior left scalp without soft tissue swelling. Sinuses/Orbits: No acute finding. Other: No mastoid effusions. IMPRESSION: 1. Small area of hypoattenuation in the left periatrial white matter could represent chronic microvascular ischemic disease or age indeterminate lacunar infarct. Additional age indeterminate lacunar infarct within the right corona radiata, favored remote. If  there is concern for acute infarct, MRI could further evaluate. 2. No acute hemorrhage. 3. Punctate foreign body in the subcutaneous high posterior left scalp, likely remote given no scalp swelling. Electronically Signed   By: Margaretha Sheffield MD   On: 12/24/2020 12:30   MR BRAIN WO CONTRAST  Result Date: 12/24/2020 CLINICAL DATA:  Neuro deficit, acute stroke suspected. EXAM: MRI HEAD WITHOUT CONTRAST TECHNIQUE: Multiplanar, multiecho pulse sequences of the brain and surrounding structures were obtained without intravenous contrast. COMPARISON:  Same day head CT. FINDINGS: Brain: Acute infarct within  the right hemi pons. Mild associated edema without substantial mass effect. Small acute infarct in the left frontal subcortical white matter (see series 2 and 250, image 29) without associated edema. Small remote lacunar infarcts in the right corona radiata and left periatrial white matter. No hydrocephalus. No acute hemorrhage. No mass lesion. No extra-axial fluid collection. Vascular: Major arterial flow voids are maintained at the skull base. Skull and upper cervical spine: Normal marrow signal. Sinuses/Orbits: Sinuses are clear.  Unremarkable orbits. IMPRESSION: 1. Acute infarct in the right hemipons. Additional small acute infarct in the left frontal subcortical white matter. 2. Mild associated edema in the right pons without substantial mass effect. 3. Small remote lacunar infarcts in the right corona radiata and left periatrial white matter. Findings discussed with Dr. Shelly Coss via telephone at 2:02 PM. Electronically Signed   By: Margaretha Sheffield MD   On: 12/24/2020 14:05    EKG: Independently reviewed.  NSR with rate 77; PVCs; LVH;  nonspecific ST changes with no evidence of acute ischemia   Labs on Admission: I have personally reviewed the available labs and imaging studies at the time of the admission.  Pertinent labs:   K+ 3.3 Glucose 305 BUN 32/Creatinine 2.12/GFR 28 - stable Normal CBC INR  1.3   Assessment/Plan Principal Problem:   Acute CVA (cerebrovascular accident) ([place]) Active Problems:   Type 2 diabetes mellitus ([place])   Essential hypertension   Paroxysmal atrial fibrillation (HCC)   Chronic kidney disease (CKD), stage IV (severe) (HCC)   Non-ischemic cardiomyopathy (HCC)   Hyperlipidemia   CVA -L hemiparesis, strongly suggestive of CVA -ABCD2 score is 6 -tPA can be given within 4.5 hours of symptom onset; this patient was not deemed to be a candidate for tPA therapy due to duration of time since onset of symptoms (symptoms started overnight) -Aspirin has been given to reduce stroke mortality and decrease morbidity -MRI confirms acute infarct in the R hemipons and another in the L frontal subcortical white matter -Will admit for further evaluation -Telemetry monitoring -MRA ordered (no CTA due to advanced CKD) -Carotid dopplers - if ipsilateral carotid stenosis is detected then prompt vascular surgery consultation is needed for consideration of CEA. -Echo -If the patient does not have known afib and this is not detected on telemetry during hospitalization, consider outpatient Holter monitoring and/or loop recorder placement. -Risk stratification with FLP, A1c; will also check TSH and UDS -Patient will need DAPT for 21 days when ABCD2 score is at least 4 and NIH score is 3 or less, and then can transition to monotherapy with a single antiplatelet agent. Will defer to neurology for now but will give ASA today. -She has been on Eliquis for afib and this may be considered treatment failure - will defer to neurology. -Consider thrombectomy if there is persistent disabling neurologic deficit associated with a vascular cut-off -Neurology consult -PT/OT/ST/Nutrition Consults -Likely needs CIR consult for placement  HTN -Allow permissive HTN for now -Treat BP only if >220/120, and then with goal of 15% reduction -Hold Norvasc, Coreg, hydralazine and plan to restart in  48-72 hours -Thiazide diuretics are recommended as first-line therapeutic agents vs. ACE/ARB for diabetic patients  Afib -Rate control with Coreg (held) and Mexiletine (continued) -Continue Eliquis, but will need to change to alternative agent if this is considered treatment failure (likely Warfarin)  Chronic combined CHF -06/16/20 echo with EF 50-55% and grade 2 diastolic dysfunction -Appears to be compensated at this time  -Hold demadex for now  HLD -Check  FLP -Continue Lipitor but will increase from 40 to 80 mg daily   DM -Check A1c -Hold Farxiga -Continue Lantus -Will order moderate-scale SSI  Stage IV CKD -Advanced CKD at baseline -Appears to be at/near goal at this time -Needs caution with nephrotoxic agents     Note: This patient has been tested and is negative for the novel coronavirus COVID-19. She has been fully vaccinated against COVID-19.    DVT prophylaxis:  Eliquis  Code Status: Full - confirmed with patient Family Communication: None present Disposition Plan:  The patient is from: home  Anticipated d/c is to: CIR  Anticipated d/c date will depend on clinical response to treatment, but likely 2-3 days  Patient is currently: acutely ill Consults called: Neurology; PT/OT/ST/Nutrition; TOC team Admission status: Admit - It is my clinical opinion that admission to INPATIENT is reasonable and necessary because of the expectation that this patient will require hospital care that crosses at least 2 midnights to treat this condition based on the medical complexity of the problems presented.  Given the aforementioned information, the predictability of an adverse outcome is felt to be significant.    Karmen Bongo MD [facility]   How to contact the [facility] Attending or Consulting provider [place] or covering provider during after hours [place], for this patient?  1. Check the care team in [facility] and look for a) attending/consulting [facility] provider listed and b) the  [facility] team listed 2. Log into [email or web address] and use [place]'s universal password to access. If you do not have the password, please contact the hospital operator. 3. Locate the [facility] provider you are looking for under [facility] and page to a number that you can be directly reached. 4. If you still have difficulty reaching the provider, please page the [facility] (Director on Call) for the Hospitalists listed on amion for assistance.   12/24/2020, 5:49 PM

## 2020-12-24 NOTE — Consult Note (Signed)
Neurology Consultation  Reason for Consult: stroke on CT and MRI Referring Physician: Delia Heady PA-C  CC: I had a stroke  History is obtained from: patient, chart  HPI: Adrienne Lane is a 55 y.o. right handed female with a PMHx of CHF last EF 50%, CKD IV, DM, AF on Eliquis, and non ischemic cardiomyopathy. Stroke in mother. Per ED records, pt woke up on 12/23/20 with floaters OU in peripheral vision. At 0200 12/24/20, she woke up at 0200 hrs with weakness in her LLE with ambulation. States she felt like she was dragging her foot at first. She went back to sleep and wokeup in am with increased LLE and new LUE weakness and had even more difficulty ambulating. States she was clumsy with her left hand. She tried to eat breakfast but was drooling and husband noticed left facial droop. States she could read but had some blurry vision. She is still experiencing floaters on exam. She is on Eliquis at home for AF and pt says she takes it religiously.   ED workup revealed [place] with possible age indeterminate lacunar infarct. MRI brain showed right sided pontine infarct with small remote lacunar infarct in the right corona radiata. Neuro was asked to consult.   Last PCP visit 10/23/20 with worsened A1c from 7 to 8.4. Last echo 06/16/20 with EF 50-55%, global; hypokinesis of LV, Grade II DD.  Last hospitalization here 05/23/20 for AKI.  LKW: unknown tpa given?: no, outside of window.  ROS: No HA, n/v, pain. Rest per HPI.   Past Medical History:  Diagnosis Date  . Atrial fibrillation with RVR ([place])   . [place] 01/2020  . CHF (congestive heart failure) ([place]) 10/20/2019  . CKD (chronic kidney disease), stage IV ([place])   . Diabetes mellitus without complication ([place])   . DOE (dyspnea on exertion)    walking upstairs or up hill resolves in one minute  . Fibroids   . History of kidney stones   . History of recent blood transfusion 02/26/2020  . Hypertension   . Iron deficiency anemia   . Non-ischemic  cardiomyopathy ([facility])    tachycardia induced  . Obese   . Peripheral edema   . Premature ventricular contractions (PVCs) (V[facility])   . Umbilical hernia   . Wears glasses     Family History  Problem Relation Age of Onset  . Atrial fibrillation Mother   . Hypertension Mother   . Stroke Mother 25  . Diabetes Mother   . Diabetes Father   . Hypertension Father   . Diabetes Sister   . Hypertension Sister   . Diabetes Brother   . Hypertension Brother   . Diabetes Brother   . Heart attack Brother   . Stroke Maternal Grandmother 4    Social History:   reports that she has never smoked. She has never used smokeless tobacco. She reports that she does not drink alcohol and does not use drugs.  Medications  Current Facility-Administered Medications:  .   stroke: mapping our early stages of recovery book, , Does not apply, Once, Karmen Bongo, MD .  acetaminophen (TYLENOL) tablet 650 mg, 650 mg, Oral, Q4H PRN **OR** acetaminophen (TYLENOL) 160 MG/5ML solution 650 mg, 650 mg, Per Tube, Q4H PRN **OR** acetaminophen (TYLENOL) suppository 650 mg, 650 mg, Rectal, Q4H PRN, Karmen Bongo, MD .  apixaban Arne Cleveland) tablet 5 mg, 5 mg, Oral, BID, Karmen Bongo, MD .  aspirin chewable tablet 81 mg, 81 mg, Oral, Daily, Karmen Bongo, MD .  atorvastatin (LIPITOR) tablet 80 mg, 80 mg, Oral, Daily, Karmen Bongo, MD .  Derrill Memo ON 12/25/2020] insulin aspart (novoLOG) injection 0-15 Units, 0-15 Units, Subcutaneous, TID WC, Karmen Bongo, MD .  Derrill Memo ON 12/25/2020] insulin glargine (LANTUS) injection 20 Units, 20 Units, Subcutaneous, Ledell Noss, MD .  Derrill Memo ON 12/25/2020] isosorbide mononitrate (IMDUR) 24 hr tablet 60 mg, 60 mg, Oral, Daily, Karmen Bongo, MD .  mexiletine (MEXITIL) capsule 300 mg, 300 mg, Oral, Q12H, Karmen Bongo, MD .  senna-docusate (Senokot-S) tablet 1 tablet, 1 tablet, Oral, QHS PRN, Karmen Bongo, MD  Current Outpatient Medications:  .  amLODipine (NORVASC) 5  MG tablet, Take 1 tablet (5 mg total) by mouth daily., Disp: 30 tablet, Rfl: 6 .  atorvastatin (LIPITOR) 40 MG tablet, Take 1 tablet (40 mg total) by mouth daily., Disp: 90 tablet, Rfl: 3 .  carvedilol (COREG) 25 MG tablet, Take 1 tablet (25 mg total) by mouth 2 (two) times daily with a meal., Disp: 180 tablet, Rfl: 3 .  dapagliflozin propanediol (FARXIGA) 10 MG TABS tablet, Take 1 tablet (10 mg total) by mouth daily before breakfast., Disp: 30 tablet, Rfl: 6 .  ELIQUIS 5 MG TABS tablet, TAKE 1 TABLET (5 MG TOTAL) BY MOUTH 2 (TWO) TIMES DAILY., Disp: 60 tablet, Rfl: 6 .  glucose blood test strip, Use as instructed, Disp: 100 each, Rfl: 12 .  hydrALAZINE (APRESOLINE) 100 MG tablet, TAKE 1 TABLET (100 MG TOTAL) BY MOUTH EVERY 8 (EIGHT) HOURS., Disp: 90 tablet, Rfl: 6 .  insulin glargine (LANTUS) 100 UNIT/ML injection, Inject 0.2 mLs (20 Units total) into the skin every morning., Disp: 10 mL, Rfl: 2 .  isosorbide mononitrate (IMDUR) 60 MG 24 hr tablet, TAKE 1 TABLET (60 MG TOTAL) BY MOUTH DAILY., Disp: 30 tablet, Rfl: 6 .  Lancets (ONETOUCH ULTRASOFT) lancets, Check CBG twice a day, Disp: 60 each, Rfl: 5 .  mexiletine (MEXITIL) 150 MG capsule, TAKE 2 CAPSULES (300 MG TOTAL) BY MOUTH EVERY 12 (TWELVE) HOURS., Disp: 120 capsule, Rfl: 6 .  Multiple Vitamins-Minerals (WOMENS MULTI PO), Take 1 tablet by mouth daily., Disp: , Rfl:  .  potassium chloride (KLOR-CON) 10 MEQ tablet, Take 4 tablets (40 mEq total) by mouth daily. (Patient taking differently: Take 20 mEq by mouth 2 (two) times daily. ), Disp: 120 tablet, Rfl: 6 .  torsemide (DEMADEX) 20 MG tablet, TAKE 2 TABLETS (40 MG TOTAL) BY MOUTH 2 (TWO) TIMES DAILY., Disp: 120 tablet, Rfl: 6 .  TRUEPLUS INSULIN SYRINGE 30G X 5/16" 0.5 ML MISC, USE TO INJECT 12 UNITS AT BEDTIME., Disp: 100 each, Rfl: 6 .  Vitamin D, Ergocalciferol, (DRISDOL) 1.25 MG (50000 UNIT) CAPS capsule, Take 1 capsule (50,000 Units total) by mouth every 7 (seven) days., Disp: 4 capsule,  Rfl: 0  Exam: Current vital signs: BP 139/78   Pulse (!) 58   Temp 98 F (36.7 C) (Oral)   Resp 16   SpO2 99%  Vital signs in last 24 hours: Temp:  [97.8 F (36.6 C)-98.3 F (36.8 C)] 98 F (36.7 C) (01/05 1753) Pulse Rate:  [57-86] 58 (01/05 1715) Resp:  [15-27] 16 (01/05 1745) BP: (133-163)/(64-94) 139/78 (01/05 1730) SpO2:  [98 %-100 %] 99 % (01/05 1745)  GENERAL: Awake, alert in NAD HEENT: - Normocephalic and atraumatic, dry mm, no LN++ LUNGS - Normal respiratory effort. SaO2 98%.  CV - RRR on tele ABDOMEN - Soft, nontender Ext: warm, well perfused  NEURO:  Mental Status: AA&Ox3  Speech/Language: speech with  slight dysarthria. Naming, repetition, fluency, and comprehension intact. Cranial Nerves:  II: PERRL 21mm/brisk. visual fields full. Color vision intact. Snellen chart OD 20/30  OS 20/30  OU 20/30 without glasses.  III, IV, VI: EOMI. Lid elevation symmetric and full.  V: sensation is intact and symmetrical to face. Moves jaw back and forth.  VII: Noted left facial droop and naso labial flattening. Able to puff cheeks and raise eyebrows.  VIII: hearing intact to voice IX, X: palate elevation is symmetric. Phonation normal.  XI: normal sternocleidomastoid and trapezius muscle strength XII: tongue is symmetrical without fasciculations.   Motor: 5/5 strength to all muscle groups except for 3/5 grip L, 4/5 proximally and 4/5 dorsiflexion LLE.  Tone is normal. Bulk is normal.  Sensation- Intact to light touch and temperature bilaterally in all four extremities. Extinction intact. Coordination: FTN intact bilaterally. Slight LUE drift. Unable to lift LLE as high as right side but able to hold 5 secs.  DTRs: 2+ brachioradialis. 1+ patella bilaterally.   Gait- deferred  1a Level of Conscious.: 0 1b LOC Questions: 0 1c LOC Commands: 0 2 Best Gaze: 0 3 Visual: 0 4 Facial Palsy: 1 5a Motor Arm - left: 1 5b Motor Arm - Right: 0 6a Motor Leg - Left: 1 6b Motor Leg -  Right: 0 7 Limb Ataxia: 0 8 Sensory: 0 9 Best Language: 0 10 Dysarthria: 1 11 Extinct. and Inatten. 0 TOTAL: 4   Labs I have reviewed labs in epic and the results pertinent to this consultation are:  CBC    Component Value Date/Time   WBC 5.4 12/24/2020 1034   RBC 4.58 12/24/2020 1034   HGB 13.0 12/24/2020 1034   HGB 12.7 10/23/2020 1109   HCT 39.8 12/24/2020 1034   HCT 38.8 10/23/2020 1109   PLT 208 12/24/2020 1034   PLT 166 10/23/2020 1109   MCV 86.9 12/24/2020 1034   MCV 82 10/23/2020 1109   MCH 28.4 12/24/2020 1034   MCHC 32.7 12/24/2020 1034   RDW 13.3 12/24/2020 1034   RDW 15.7 (H) 10/23/2020 1109   LYMPHSABS 0.8 12/24/2020 1034   LYMPHSABS 1.1 10/23/2020 1109   MONOABS 0.4 12/24/2020 1034   EOSABS 0.1 12/24/2020 1034   EOSABS 0.2 10/23/2020 1109   BASOSABS 0.0 12/24/2020 1034   BASOSABS 0.0 10/23/2020 1109    CMP     Component Value Date/Time   NA 139 12/24/2020 1034   NA 148 (H) 10/23/2020 1109   K 3.3 (L) 12/24/2020 1034   CL 102 12/24/2020 1034   CO2 24 12/24/2020 1034   GLUCOSE 305 (H) 12/24/2020 1034   BUN 32 (H) 12/24/2020 1034   BUN 30 (H) 10/23/2020 1109   CREATININE 2.12 (H) 12/24/2020 1034   CALCIUM 9.4 12/24/2020 1034   PROT 7.2 12/24/2020 1034   PROT 7.2 10/23/2020 1109   ALBUMIN 3.1 (L) 12/24/2020 1034   ALBUMIN 3.9 10/23/2020 1109   AST 25 12/24/2020 1034   ALT 18 12/24/2020 1034   ALKPHOS 95 12/24/2020 1034   BILITOT 0.6 12/24/2020 1034   BILITOT 0.4 10/23/2020 1109   GFRNONAA 28 (L) 12/24/2020 1034   GFRAA 44 (L) 10/23/2020 1109    Lipid Panel     Component Value Date/Time   CHOL 283 (H) 10/23/2020 1109   TRIG 122 10/23/2020 1109   HDL 75 10/23/2020 1109   CHOLHDL 3.8 10/23/2020 1109   CHOLHDL 2.5 10/26/2019 1020   VLDL 11 10/26/2019 1020   LDLCALC 187 (H) 10/23/2020  1109     Imaging  CTH 1. Small area of hypoattenuation in the left periatrial white matter could represent chronic microvascular ischemic disease or  age indeterminate lacunar infarct. Additional age indeterminate lacunar infarct within the right corona radiata, favored remote. If there is concern for acute infarct, MRI could further evaluate. 2. No acute hemorrhage. 3. Punctate foreign body in the subcutaneous high posterior left scalp, likely remote given no scalp swelling.  MRI brain 1. Acute infarct in the right hemipons. Additional small acute infarct in the left frontal subcortical white matter. 2. Mild associated edema in the right pons without substantial mass effect. 3. Small remote lacunar infarcts in the right corona radiata and left periatrial white matter.  Assessment: 55 yo female with multiple chronic medical issues. Significant neurological hx includes HTN, CHF, HLD, uncontrolled DM, and obesity. Pt was brought in by EMS as a CODE STROKE. Pt reported floaters on 12/23/20. On 12/24/20 at 0200 she woke up with LLE weakness. She went back to sleep and woke up in am with LLE and LUE weakness with difficulty ambulating, drooling, and facial droop. CTH positive for age indeterminate lacunar infarct left periatrial white matter and right corona radiata. MRI b showed acute infarct in the right hemipons and left frontal subcortical white matter. Mild edema in right pons with no mass effect and small remote lacunar infarcts as found on CTH. After these findings, neuro was consulted.   Impression: CVA, acute, age indeterminant and remote infarcts.   Recommendations: -MRA brain-can not have contrast due to CKD IV -Vascular US carotids -TTE -ASA 81mg  po qd and continue Eliquis -TSH, HbA1c, lipid panel.  -permissive HTN to 200/100 in first 48-72 hours, then normalize.  -Increase home statin dose if LDL over 70.  -multiple risk factors for stroke. Risk stratification.  -stroke education -swallow study-passed -frequent neuro checks -PT/OT consults. Speech as needed.  -stroke team to follow.   Pt seen by Clance Boll, NP/Neuro  and later by MD. Note/plan to be edited by MD as needed.  Pager: [phone]  I have seen the patient reviewed the above note.  She has two strokes, suspect concurrent small vessel disease, but given the history of visual phenomena (now resolved) with the posterior circulation stroke, may be embolism does need to be considered.  She is getting work-up as above.  Roland Rack, MD [facility] [phone]  If 7pm- 7am, please page neurology on call as listed in [place].

## 2020-12-24 NOTE — ED Notes (Signed)
Dr Karle Starch evaluated pt in triage, pt is LVO negative, no code stroke called

## 2020-12-24 NOTE — ED Notes (Addendum)
Pt in MRI, interferes with vitals collection

## 2020-12-25 ENCOUNTER — Inpatient Hospital Stay (HOSPITAL_COMMUNITY): Payer: Self-pay

## 2020-12-25 DIAGNOSIS — I69351 Hemiplegia and hemiparesis following cerebral infarction affecting right dominant side: Secondary | ICD-10-CM

## 2020-12-25 LAB — ECHOCARDIOGRAM COMPLETE
Calc EF: 31 %
S' Lateral: 4.2 cm
Single Plane A2C EF: 28.9 %
Single Plane A4C EF: 33.9 %

## 2020-12-25 LAB — HEMOGLOBIN A1C
Hgb A1c MFr Bld: 8 % — ABNORMAL HIGH (ref 4.8–5.6)
Mean Plasma Glucose: 182.9 mg/dL

## 2020-12-25 LAB — LIPID PANEL
Cholesterol: 260 mg/dL — ABNORMAL HIGH (ref 0–200)
HDL: 59 mg/dL (ref >40)
LDL Cholesterol: 173 mg/dL — ABNORMAL HIGH (ref 0–99)
Total CHOL/HDL Ratio: 4.4 RATIO
Triglycerides: 141 mg/dL (ref <150)
VLDL: 28 mg/dL (ref 0–40)

## 2020-12-25 LAB — GLUCOSE, CAPILLARY
Glucose-Capillary: 148 mg/dL — ABNORMAL HIGH (ref 70–99)
Glucose-Capillary: 140 mg/dL — ABNORMAL HIGH (ref 70–99)
Glucose-Capillary: 232 mg/dL — ABNORMAL HIGH (ref 70–99)
Glucose-Capillary: 115 mg/dL — ABNORMAL HIGH (ref 70–99)

## 2020-12-25 LAB — TSH: TSH: 1.712 u[IU]/mL (ref 0.350–4.500)

## 2020-12-25 MED ORDER — DABIGATRAN ETEXILATE MESYLATE 150 MG PO CAPS
150.0000 mg | ORAL_CAPSULE | Freq: Two times a day (BID) | ORAL | Status: DC
  Administered 2020-12-25 – 2020-12-27 (×4): 150 mg via ORAL
  Filled 2020-12-25 (×5): qty 1

## 2020-12-25 MED ORDER — GLUCERNA SHAKE PO LIQD
237.0000 mL | Freq: Two times a day (BID) | ORAL | Status: DC
  Administered 2020-12-25 – 2020-12-26 (×3): 237 mL via ORAL

## 2020-12-25 NOTE — Evaluation (Signed)
Occupational Therapy Evaluation Patient Details Name: Adrienne Lane MRN: [id number] DOB: 09-11-1966 Today's Date: 12/25/2020    History of Present Illness 55yo female c/o L sided weakness, facial droop with slurred speech, and gait difficulty. MRI shows R pontine infarct, small infarct in L frontal subcortical white matter, and small remote lacunar infarcts in the R corona radiata and L periatrial white matter. PMH A-fib with RVR, CHF, CKD, DM, obesity, HTN, cardiomyopathy   Clinical Impression   PTA, pt was living at home with her husband, mother-in-law and son, pt reports she was independent with ADL/IADL and functional mobility. Pt was still driving. Pt currently requires minA to powerup into standing from EOB, she was able to ambulate about 10 feet before needing to sit down due to decreased strength and stability in left side. Pt required minA at [facility] level for functional mobility. Pt demonstrates LLE and LUE weakness and decreased coordination. Pt reports floaters during session and demonstrated limitations with left peripheral vision. Due to decline in current level of function, pt would benefit from acute OT to address established goals to facilitate safe D/C to venue listed below. At this time, recommend CIR follow-up. Will continue to follow acutely.     Follow Up Recommendations  CIR    Equipment Recommendations  3 in 1 bedside commode    Recommendations for Other Services       Precautions / Restrictions Precautions Precautions: Fall;Other (comment) Precaution Comments: L sided weakness Restrictions Weight Bearing Restrictions: No      Mobility Bed Mobility Overal bed mobility: Needs Assistance Bed Mobility: Supine to Sit;Sit to Supine     Supine to sit: HOB elevated;Modified independent (Device/Increase time) Sit to supine: Modified independent (Device/Increase time)   General bed mobility comments: increased time and effort, pt hooked LLE to return BLE to bed     Transfers Overall transfer level: Needs assistance Equipment used: Rolling walker (2 wheeled) Transfers: Sit to/from [facility] Sit to Stand: Min assist Stand pivot transfers: Total assist       General transfer comment: minA with x2 attempts to progress into standing, pt utilizing rock to gain momentum to powerup into standing    Balance Overall balance assessment: Needs assistance Sitting-balance support: Bilateral upper extremity supported;Feet supported Sitting balance-Leahy Scale: Fair Sitting balance - Comments: posterior bias with dynamic activities Postural control: Posterior lean Standing balance support: Bilateral upper extremity supported;During functional activity Standing balance-Leahy Scale: Poor Standing balance comment: reliant on BUE support                           ADL either performed or assessed with clinical judgement   ADL Overall ADL's : Needs assistance/impaired Eating/Feeding: Set up;Sitting   Grooming: Set up;Sitting   Upper Body Bathing: Minimal assistance;Sitting   Lower Body Bathing: Minimal assistance;Sit to/from stand   Upper Body Dressing : Minimal assistance;Sitting   Lower Body Dressing: Minimal assistance;Sit to/from stand   Toilet Transfer: Minimal assistance;RW;Ambulation Armed forces technical officer Details (indicate cue type and reason): short distances due to decrease endurance in LLE Toileting- Clothing Manipulation and Hygiene: Minimal assistance;Sit to/from stand       Functional mobility during ADLs: Minimal assistance;Rolling walker General ADL Comments: pt with decreased activity tolerance, decreased endurance, Lsided weakness and decreased coordination     Vision Baseline Vision/History: No visual deficits Patient Visual Report: Other (comment) (floaters at times) Vision Assessment?: Yes Eye Alignment: Within Functional Limits Ocular Range of Motion: Within Functional Limits Alignment/Gaze  Preference: Within Defined Limits Tracking/Visual Pursuits: Able to track stimulus in all quads without difficulty Saccades: Within functional limits Convergence: Within functional limits Visual Fields: Left visual field deficit (noted decreased peripheral vision left side) Additional Comments: noted decreased peripheral vision left side     Perception     Praxis      Pertinent Vitals/Pain Pain Assessment: No/denies pain     Hand Dominance Right   Extremity/Trunk Assessment Upper Extremity Assessment Upper Extremity Assessment: LUE deficits/detail LUE Deficits / Details: overall 2/5; pt with poor functional use of LUE;sensation in tact, decreased fine motor skills and limited rapid alternating movements, decreased gross motor coordination;pt demonstrates limitations with motor planning LUE Sensation: WNL LUE Coordination: decreased fine motor;decreased gross motor   Lower Extremity Assessment Lower Extremity Assessment: RLE deficits/detail;LLE deficits/detail RLE Deficits / Details: WNL RLE Sensation: WNL RLE Coordination: WNL LLE Deficits / Details: ankle dorsiflexion 4/5, quad 4/5, hip flexor 2/5 LLE Sensation: decreased proprioception LLE Coordination: decreased gross motor   Cervical / Trunk Assessment Cervical / Trunk Assessment: Normal   Communication Communication Communication: No difficulties (reports difficulty with oral motor control)   Cognition Arousal/Alertness: Awake/alert Behavior During Therapy: WFL for tasks assessed/performed Overall Cognitive Status: Within Functional Limits for tasks assessed                                 General Comments: pt scored 0/28 on Short Blessed Test screener to assess cognition, she demonstrated normal functioning with memory, problem solving, working memory   General Comments  vss    Exercises Exercises: Other exercises Other Exercises Other Exercises: educated pt on HEP for LUE and to utilize UE  throughout daily activities   Shoulder Instructions      Home Living Family/patient expects to be discharged to:: Private residence Living Arrangements: Spouse/significant other;Other (Comment) (also mother in law, and son) Available Help at Discharge: Family;Available 24 hours/day Type of Home: Mobile home Home Access: Stairs to enter Entrance Stairs-Number of Steps: 5 Entrance Stairs-Rails: Can reach both Home Layout: One level     Bathroom Shower/Tub: Walk-in shower (two steps up to get into shower)   Bathroom Toilet: Standard     Home Equipment: None          Prior Functioning/Environment Level of Independence: Independent                 OT Problem List: Decreased strength;Decreased activity tolerance;Impaired balance (sitting and/or standing);Decreased range of motion;Decreased safety awareness;Decreased knowledge of precautions;Impaired UE functional use      OT Treatment/Interventions: Self-care/ADL training;Therapeutic exercise;DME and/or AE instruction;Therapeutic activities;Visual/perceptual remediation/compensation;Patient/family education;Balance training    OT Goals(Current goals can be found in the care plan section) Acute Rehab OT Goals Patient Stated Goal: go to rehab OT Goal Formulation: With patient Time For Goal Achievement: 01/08/21 Potential to Achieve Goals: Good ADL Goals Pt Will Perform Grooming: with modified independence;standing Pt Will Perform Lower Body Dressing: with modified independence;sit to/from stand Pt Will Transfer to Toilet: with modified independence;ambulating Pt/caregiver will Perform Home Exercise Program: Increased strength;Left upper extremity;With written HEP provided  OT Frequency: Min 2X/week   Barriers to D/C:            Co-evaluation              AM-PAC OT "6 Clicks" Daily Activity     Outcome Measure Help from another person eating meals?: A Little Help from another person taking care of  personal  grooming?: A Little Help from another person toileting, which includes using toliet, bedpan, or urinal?: A Little Help from another person bathing (including washing, rinsing, drying)?: A Little Help from another person to put on and taking off regular upper body clothing?: A Little Help from another person to put on and taking off regular lower body clothing?: A Little 6 Click Score: 18   End of Session Equipment Utilized During Treatment: Gait belt;Rolling walker Nurse Communication: Mobility status  Activity Tolerance: Patient tolerated treatment well Patient left: in bed;with call bell/phone within reach;with bed alarm set  OT Visit Diagnosis: Unsteadiness on feet (R26.81);Other abnormalities of gait and mobility (R26.89);Muscle weakness (generalized) (M62.81);Hemiplegia and hemiparesis Hemiplegia - Right/Left: Left Hemiplegia - dominant/non-dominant: Non-Dominant Hemiplegia - caused by: Cerebral infarction                Time: 1003-1036 OT Time Calculation (min): 33 min Charges:  OT General Charges $OT Visit: 1 Visit OT Evaluation $OT Eval Moderate Complexity: 1 Mod OT Treatments $Self Care/Home Management : 8-22 mins  Helene Kelp OTR/L Acute Rehabilitation Services Office: [phone]   Wyn Forster 12/25/2020, 1:30 PM

## 2020-12-25 NOTE — Progress Notes (Signed)
PROGRESS NOTE  Adrienne Lane ZOX:[id number] DOB: 1966/03/14 DOA: 12/24/2020 PCP: Nicolette Bang, DO  HPI/Recap of past 24 hours: Adrienne Lane is a 55 y.o. female with medical history significant of obesity; DM2; paroxysmal afib on Eliquis; stage IIIB CKD; nephrolithiasis; and chronic systolic CHF presenting with stroke-like symptoms.  She reports that she couldn't control her left side.  She noticed it in the middle of the night and then noticed it more the morning of her presentation.    Associated with blurry vision, left facial droop with drooling.  LKW unknown.  She presented outside of window for TPA, was not given.  Seen by neurology/stroke team.  Ongoing work-up for CVA.  MRI brain showed acute infarct in the right hemipons, additional small acute infarct in the left frontal subcortical white matter.  Mild associated edema in the right pons without substantial mass-effect.  Small remote lacunar infarct in the right corona radiata and left periatrial white matter.  She had no significant carotid artery stenosis.  2D echo showed reduced LVEF 45 to 50% the left ventricle demonstrates global hypokinesis.  No PFO.  No intracardiac source of thrombus.  Hemoglobin A1c and LDL not at goal.  Aspirin 81 mg was added to her home Eliquis.  PT recommended CIR.  12/25/20: Patient was seen and examined at her bedside.  She continues to have left upper and left lower extremity weakness.    Assessment/Plan: Principal Problem:   Acute CVA (cerebrovascular accident) [facility]) Active Problems:   Type 2 diabetes mellitus ([place])   Essential hypertension   Paroxysmal atrial fibrillation (HCC)   Chronic kidney disease (CKD), stage IV (severe) (HCC)   Non-ischemic cardiomyopathy (HCC)   Hyperlipidemia  Acute ischemic CVA involving right hemipons, left frontal subcortical white matter Mild associated edema in the right pons without substantial mass-effect. Small remote lacunar infarct in the right  corona radiata and left periorbital white matter. Patient states she was taking her Eliquis for paroxysmal A. fib as prescribed Aspirin 81 mg daily and Lipitor 80 mg daily added per neurosurgery/stroke team LDL 173, goal less than 70 Hemoglobin A1c 8.0, goal less than 7.0 She had no significant carotid artery stenosis.  2D echo showed reduced LVEF 45 to 50% the left ventricle demonstrates global hypokinesis.  No PFO.  No intracardiac source of thrombus. PT recommended CIR. CIR will see in consultation. Continue PT OT with assistance and fall precautions Permissive hypertension Long-term BP goal normotensive  Essential hypertension Ongoing permissive hypertension in the next 48 to 72 hours Hold off home Norvasc, Coreg, p.o. hydralazine, Imdur, and torsemide Continue to closely monitor vital signs  Chronic systolic CHF 2D echo done on 12/25/2018 shows reduced LVEF 45 to 50% with left ventricle global hypokinesis Euvolemic on exam Diuretics on hold due to permissive hypertension Start strict I's and O's and daily weight  Hyperlipidemia Asthma stable  Type 2 diabetes with hyperglycemia Management as stated above.  Ambulatory dysfunction in the setting of acute stroke Management as stated above.    Code Status:   Family Communication: None at bedside  Disposition Plan: Likely will DC to CIR  Consultants:  Neurology/stroke team  CIR  Procedures:  2D echo  Antimicrobials:  None.  DVT prophylaxis: Eliquis  Status is: Inpatient    Dispo:  Patient From: Home  Planned Disposition: Home with Health Care Svc  Expected discharge date: 12/26/2020  Medically stable for discharge: No, pending CIR placement.         Objective: Vitals:  12/24/20 2036 12/25/20 0035 12/25/20 0456 12/25/20 1208  BP: (!) 157/62 137/73 137/71 135/80  Pulse: 65 62 65 (!) 57  Resp: 17 17 18 18   Temp: 97.9 F (36.6 C) 97.8 F (36.6 C) 98.4 F (36.9 C) 98.1 F (36.7 C)  TempSrc:  Oral Oral Oral Oral  SpO2: 99% 97% 100% 96%    Intake/Output Summary (Last 24 hours) at 12/25/2020 1259 Last data filed at 12/25/2020 0717 Gross per 24 hour  Intake 240 ml  Output 500 ml  Net -260 ml   There were no vitals filed for this visit.  Exam:  . General: 55 y.o. year-old female well developed well nourished in no acute distress.  Alert and oriented x3. . Cardiovascular: Regular rate and rhythm with no rubs or gallops.  No thyromegaly or JVD noted.   Marland Kitchen Respiratory: Clear to auscultation with no wheezes or rales. Good inspiratory effort. . Abdomen: Soft nontender nondistended with normal bowel sounds x4 quadrants. . Musculoskeletal: No lower extremity edema bilaterally.  3/5 strength in left upper and left lower extremities.   . Skin: No ulcerative lesions noted or rashes, . Psychiatry: Mood is appropriate for condition and setting   Data Reviewed: CBC: Recent Labs  Lab 12/24/20 1034  WBC 5.4  NEUTROABS 4.0  HGB 13.0  HCT 39.8  MCV 86.9  PLT 778   Basic Metabolic Panel: Recent Labs  Lab 12/24/20 1034  NA 139  K 3.3*  CL 102  CO2 24  GLUCOSE 305*  BUN 32*  CREATININE 2.12*  CALCIUM 9.4   GFR: CrCl cannot be calculated (Unknown ideal weight.). Liver Function Tests: Recent Labs  Lab 12/24/20 1034  AST 25  ALT 18  ALKPHOS 95  BILITOT 0.6  PROT 7.2  ALBUMIN 3.1*   No results for input(s): LIPASE, AMYLASE in the last 168 hours. No results for input(s): AMMONIA in the last 168 hours. Coagulation Profile: Recent Labs  Lab 12/24/20 1034  INR 1.3*   Cardiac Enzymes: No results for input(s): CKTOTAL, CKMB, CKMBINDEX, TROPONINI in the last 168 hours. BNP (last 3 results) No results for input(s): PROBNP in the last 8760 hours. HbA1C: Recent Labs    12/25/20 0202  HGBA1C 8.0*   CBG: Recent Labs  Lab 12/24/20 1001 12/24/20 2126 12/25/20 0617 12/25/20 1211  GLUCAP 293* 240* 148* 140*   Lipid Profile: Recent Labs    12/25/20 0202  CHOL  260*  HDL 59  LDLCALC 173*  TRIG 141  CHOLHDL 4.4   Thyroid Function Tests: Recent Labs    12/25/20 0202  TSH 1.712   Anemia Panel: No results for input(s): VITAMINB12, FOLATE, FERRITIN, TIBC, IRON, RETICCTPCT in the last 72 hours. Urine analysis:    Component Value Date/Time   COLORURINE STRAW (A) 05/23/2020 1400   APPEARANCEUR CLEAR 05/23/2020 1400   LABSPEC 1.006 05/23/2020 1400   PHURINE 7.0 05/23/2020 1400   GLUCOSEU NEGATIVE 05/23/2020 1400   HGBUR NEGATIVE 05/23/2020 1400   BILIRUBINUR NEGATIVE 05/23/2020 1400   KETONESUR NEGATIVE 05/23/2020 1400   PROTEINUR 100 (A) 05/23/2020 1400   NITRITE NEGATIVE 05/23/2020 1400   LEUKOCYTESUR TRACE (A) 05/23/2020 1400   Sepsis Labs: @LABRCNTIP (procalcitonin:4,lacticidven:4)  ) Recent Results (from the past 240 hour(s))  Resp Panel by RT-PCR (Flu A&B, Covid) Nasopharyngeal Swab     Status: None   Collection Time: 12/24/20  4:18 PM   Specimen: Nasopharyngeal Swab; Nasopharyngeal(NP) swabs in vial transport medium  Result Value Ref Range Status   SARS Coronavirus 2 by  RT PCR NEGATIVE NEGATIVE Final    Comment: (NOTE) SARS-CoV-2 target nucleic acids are NOT DETECTED.  The SARS-CoV-2 RNA is generally detectable in upper respiratory specimens during the acute phase of infection. The lowest concentration of SARS-CoV-2 viral copies this assay can detect is 138 copies/mL. A negative result does not preclude SARS-Cov-2 infection and should not be used as the sole basis for treatment or other patient management decisions. A negative result may occur with  improper specimen collection/handling, submission of specimen other than nasopharyngeal swab, presence of viral mutation(s) within the areas targeted by this assay, and inadequate number of viral copies(<138 copies/mL). A negative result must be combined with clinical observations, patient history, and epidemiological information. The expected result is Negative.  Fact Sheet  for Patients:  EntrepreneurPulse.com.au  Fact Sheet for Healthcare Providers:  IncredibleEmployment.be  This test is no t yet approved or cleared by the [place] FDA and  has been authorized for detection and/or diagnosis of SARS-CoV-2 by FDA under an Emergency Use Authorization (EUA). This EUA will remain  in effect (meaning this test can be used) for the duration of the COVID-19 declaration under Section 564(b)(1) of the Act, 21 U.S.C.section 360bbb-3(b)(1), unless the authorization is terminated  or revoked sooner.       Influenza A by PCR NEGATIVE NEGATIVE Final   Influenza B by PCR NEGATIVE NEGATIVE Final    Comment: (NOTE) The Xpert Xpress SARS-CoV-2/FLU/RSV plus assay is intended as an aid in the diagnosis of influenza from Nasopharyngeal swab specimens and should not be used as a sole basis for treatment. Nasal washings and aspirates are unacceptable for Xpert Xpress SARS-CoV-2/FLU/RSV testing.  Fact Sheet for Patients: EntrepreneurPulse.com.au  Fact Sheet for Healthcare Providers: IncredibleEmployment.be  This test is not yet approved or cleared by the [place] FDA and has been authorized for detection and/or diagnosis of SARS-CoV-2 by FDA under an Emergency Use Authorization (EUA). This EUA will remain in effect (meaning this test can be used) for the duration of the COVID-19 declaration under Section 564(b)(1) of the Act, 21 U.S.C. section 360bbb-3(b)(1), unless the authorization is terminated or revoked.  Performed at [facility], [place] [address] [place], [place] [address]       Studies: MR ANGIO HEAD WO CONTRAST  Result Date: 12/24/2020 CLINICAL DATA:  Follow-up examination for acute stroke. EXAM: MRA HEAD WITHOUT CONTRAST TECHNIQUE: Angiographic images of the Circle of Willis were obtained using MRA technique without intravenous contrast. COMPARISON:  Prior MRI from  earlier the same day. FINDINGS: ANTERIOR CIRCULATION: Visualized distal cervical segments of the internal carotid arteries are widely patent with symmetric antegrade flow. Petrous, cavernous, and supraclinoid segments widely patent without stenosis or other abnormality. A1 segments widely patent. Normal anterior communicating artery complex. Anterior cerebral arteries widely patent to their distal aspects. No M1 stenosis or occlusion. Normal MCA bifurcations. Distal MCA branches well perfused and symmetric. POSTERIOR CIRCULATION: Both V4 segments widely patent to the vertebrobasilar junction without stenosis. Left vertebral artery dominant. Both PICA origins patent and normal. Basilar widely patent to its distal aspect. Superior cerebellar arteries patent bilaterally. Both PCAs supplied via the basilar as well as small bilateral posterior communicating arteries. PCAs well perfused to their distal aspects without stenosis. No intracranial aneurysm or other vascular abnormality. IMPRESSION: Normal intracranial MRA. No large vessel occlusion, hemodynamically significant stenosis, or other acute vascular abnormality. Electronically Signed   By: Jeannine Boga M.D.   On: 12/24/2020 22:40   MR BRAIN WO CONTRAST  Result  Date: 12/24/2020 CLINICAL DATA:  Neuro deficit, acute stroke suspected. EXAM: MRI HEAD WITHOUT CONTRAST TECHNIQUE: Multiplanar, multiecho pulse sequences of the brain and surrounding structures were obtained without intravenous contrast. COMPARISON:  Same day head CT. FINDINGS: Brain: Acute infarct within the right hemi pons. Mild associated edema without substantial mass effect. Small acute infarct in the left frontal subcortical white matter (see series 2 and 250, image 29) without associated edema. Small remote lacunar infarcts in the right corona radiata and left periatrial white matter. No hydrocephalus. No acute hemorrhage. No mass lesion. No extra-axial fluid collection. Vascular: Major  arterial flow voids are maintained at the skull base. Skull and upper cervical spine: Normal marrow signal. Sinuses/Orbits: Sinuses are clear.  Unremarkable orbits. IMPRESSION: 1. Acute infarct in the right hemipons. Additional small acute infarct in the left frontal subcortical white matter. 2. Mild associated edema in the right pons without substantial mass effect. 3. Small remote lacunar infarcts in the right corona radiata and left periatrial white matter. Findings discussed with Dr. Shelly Coss via telephone at 2:02 PM. Electronically Signed   By: Margaretha Sheffield MD   On: 12/24/2020 14:05   ECHOCARDIOGRAM COMPLETE  Result Date: 12/25/2020    ECHOCARDIOGRAM REPORT   Patient Name:   Adrienne Lane Date of Exam: 12/25/2020 Medical Rec #:  [id number]      Height:       68.0 in Accession #:    [id number]     Weight:       224.0 lb Date of Birth:  27-Jul-1966      BSA:          2.145 m Patient Age:    21 years       BP:           137/71 mmHg Patient Gender: F              HR:           81 bpm. Exam Location:  Inpatient Procedure: 2D Echo, Color Doppler and Cardiac Doppler Indications:    Stroke i163.96  History:        Patient has prior history of Echocardiogram examinations, most                 recent 06/16/2020. CHF, Arrythmias:Atrial Fibrillation; Risk                 Factors:Hypertension, Diabetes and Dyslipidemia.  Sonographer:    Raquel Sarna Senior RDCS Referring Phys: [employer]  1. Since the last study on 06/16/20 LVEF remains mildly decreased with diffuse hypokinesis and LVEF 45-50%. RVEF is mildly decreased, RV is now less dilated and RVSP has improved from 50 to 30 mmHg.  2. Left ventricular ejection fraction, by estimation, is 45 to 50%. The left ventricle has mildly decreased function. The left ventricle demonstrates global hypokinesis. Left ventricular diastolic function could not be evaluated.  3. Right ventricular systolic function is mildly reduced. The right ventricular size is normal.  There is normal pulmonary artery systolic pressure. The estimated right ventricular systolic pressure is 38.4 mmHg.  4. Left atrial size was mildly dilated.  5. The mitral valve is normal in structure. Mild mitral valve regurgitation. No evidence of mitral stenosis.  6. The aortic valve is normal in structure. Aortic valve regurgitation is not visualized. No aortic stenosis is present.  7. The inferior vena cava is normal in size with <50% respiratory variability, suggesting right atrial pressure of 8 mmHg. FINDINGS  Left Ventricle: Left  ventricular ejection fraction, by estimation, is 45 to 50%. The left ventricle has mildly decreased function. The left ventricle demonstrates global hypokinesis. The left ventricular internal cavity size was normal in size. There is  no left ventricular hypertrophy. Left ventricular diastolic function could not be evaluated due to atrial fibrillation. Left ventricular diastolic function could not be evaluated. Right Ventricle: The right ventricular size is normal. No increase in right ventricular wall thickness. Right ventricular systolic function is mildly reduced. There is normal pulmonary artery systolic pressure. The tricuspid regurgitant velocity is 2.35 m/s, and with an assumed right atrial pressure of 8 mmHg, the estimated right ventricular systolic pressure is 40.8 mmHg. Left Atrium: Left atrial size was mildly dilated. Right Atrium: Right atrial size was normal in size. Pericardium: There is no evidence of pericardial effusion. Mitral Valve: The mitral valve is normal in structure. Mild mitral valve regurgitation. No evidence of mitral valve stenosis. Tricuspid Valve: The tricuspid valve is normal in structure. Tricuspid valve regurgitation is mild . No evidence of tricuspid stenosis. Aortic Valve: The aortic valve is normal in structure. Aortic valve regurgitation is not visualized. No aortic stenosis is present. Pulmonic Valve: The pulmonic valve was normal in structure.  Pulmonic valve regurgitation is not visualized. No evidence of pulmonic stenosis. Aorta: The aortic root is normal in size and structure. Venous: The inferior vena cava is normal in size with less than 50% respiratory variability, suggesting right atrial pressure of 8 mmHg. IAS/Shunts: No atrial level shunt detected by color flow Doppler.  LEFT VENTRICLE PLAX 2D LVIDd:         4.80 cm LVIDs:         4.20 cm LV PW:         1.20 cm LV IVS:        0.70 cm LVOT diam:     2.30 cm LV SV:         54 LV SV Index:   25 LVOT Area:     4.15 cm  LV Volumes (MOD) LV vol d, MOD A2C: 84.7 ml LV vol d, MOD A4C: 99.4 ml LV vol s, MOD A2C: 60.2 ml LV vol s, MOD A4C: 65.7 ml LV SV MOD A2C:     24.5 ml LV SV MOD A4C:     99.4 ml LV SV MOD BP:      31.4 ml RIGHT VENTRICLE RV S prime:     7.51 cm/s TAPSE (M-mode): 2.3 cm LEFT ATRIUM             Index       RIGHT ATRIUM           Index LA diam:        3.60 cm 1.68 cm/m  RA Area:     15.70 cm LA Vol (A2C):   83.1 ml 38.75 ml/m RA Volume:   41.60 ml  19.40 ml/m LA Vol (A4C):   62.1 ml 28.96 ml/m LA Biplane Vol: 71.2 ml 33.20 ml/m  AORTIC VALVE LVOT Vmax:   66.25 cm/s LVOT Vmean:  43.275 cm/s LVOT VTI:    0.129 m  AORTA Ao Root diam: 2.80 cm TRICUSPID VALVE TR Peak grad:   22.1 mmHg TR Vmax:        235.00 cm/s  SHUNTS Systemic VTI:  0.13 m Systemic Diam: 2.30 cm Ena Dawley MD Electronically signed by Ena Dawley MD Signature Date/Time: 12/25/2020/11:53:23 AM    Final    VAS US CAROTID  Result Date: 12/25/2020 Carotid Arterial  Duplex Study Indications:   CVA and Left side weakness, left facial drrop, speech                disturbance. Risk Factors:  Hypertension, Diabetes, no history of smoking. Other Factors: CKD4, CHF, AFIB, Obesity. Performing Technologist: Rogelia Rohrer  Examination Guidelines: A complete evaluation includes B-mode imaging, spectral Doppler, color Doppler, and power Doppler as needed of all accessible portions of each vessel. Bilateral testing is considered  an integral part of a complete examination. Limited examinations for reoccurring indications may be performed as noted.  Right Carotid Findings: +----------+--------+--------+--------+------------------+--------+           PSV cm/sEDV cm/sStenosisPlaque DescriptionComments +----------+--------+--------+--------+------------------+--------+ CCA Prox  97      11                                         +----------+--------+--------+--------+------------------+--------+ CCA Distal78      28                                         +----------+--------+--------+--------+------------------+--------+ ICA Prox  65      16      1-39%                              +----------+--------+--------+--------+------------------+--------+ ICA Distal109     25                                         +----------+--------+--------+--------+------------------+--------+ ECA       89      0                                          +----------+--------+--------+--------+------------------+--------+ +----------+--------+-------+----------------+-------------------+           PSV cm/sEDV cmsDescribe        Arm Pressure (mmHG) +----------+--------+-------+----------------+-------------------+ [id number]     0      Multiphasic, WNL                    +----------+--------+-------+----------------+-------------------+ +---------+--------+--+--------+--+---------+ VertebralPSV cm/s61EDV cm/s13Antegrade +---------+--------+--+--------+--+---------+  Left Carotid Findings: +----------+--------+--------+--------+------------------+--------+           PSV cm/sEDV cm/sStenosisPlaque DescriptionComments +----------+--------+--------+--------+------------------+--------+ CCA Prox  107     15                                         +----------+--------+--------+--------+------------------+--------+ CCA Distal84      18                                          +----------+--------+--------+--------+------------------+--------+ ICA Prox  89      25      1-39%                              +----------+--------+--------+--------+------------------+--------+ ICA Distal93  28                                         +----------+--------+--------+--------+------------------+--------+ ECA       64      0                                          +----------+--------+--------+--------+------------------+--------+ +----------+--------+--------+----------------+-------------------+           PSV cm/sEDV cm/sDescribe        Arm Pressure (mmHG) +----------+--------+--------+----------------+-------------------+ [id number]      11      Multiphasic, WNL                    +----------+--------+--------+----------------+-------------------+ +---------+--------+--+--------+--+---------+ VertebralPSV cm/s63EDV cm/s19Antegrade +---------+--------+--+--------+--+---------+   Summary: Right Carotid: Velocities in the right ICA are consistent with a 1-39% stenosis.                The extracranial vessels were near-normal with only minimal wall                thickening or plaque. Left Carotid: Velocities in the left ICA are consistent with a 1-39% stenosis.               The extracranial vessels were near-normal with only minimal wall               thickening or plaque. Vertebrals:  Bilateral vertebral arteries demonstrate antegrade flow. Subclavians: Normal flow hemodynamics were seen in bilateral subclavian              arteries. *See table(s) above for measurements and observations.     Preliminary     Scheduled Meds: .  stroke: mapping our early stages of recovery book   Does not apply Once  . apixaban  5 mg Oral BID  . aspirin  81 mg Oral Daily  . atorvastatin  80 mg Oral QPM  . feeding supplement (GLUCERNA SHAKE)  237 mL Oral BID BM  . insulin aspart  0-15 Units Subcutaneous TID WC  . insulin glargine  20 Units Subcutaneous Q24H  .  mexiletine  300 mg Oral Q12H    Continuous Infusions:   LOS: 1 day     Kayleen Memos, MD [facility] Pager [phone]  If 7PM-7AM, please contact night-coverage [email or web address] Password [facility] 12/25/2020, 12:59 PM

## 2020-12-25 NOTE — TOC Initial Note (Signed)
Transition of Care [facility]) - Initial/Assessment Note    Patient Details  Name: Adrienne Lane MRN: [id number] Date of Birth: Nov 28, 1966  [facility]) CM/SW Contact:    Marilu Favre, RN Phone Number: 12/25/2020, 4:27 PM  Clinical Narrative:                 Patient from home with husband and mother in law. Patient had no DME prior to admission.  Await CIR determination.   Scheduled follow up with Primary Care at [facility]. Can assist with discharge medications through [facility], she can use their pharmacy for prescriptions after discharge.    Discussed all of above with patient. Patient voiced understanding   Expected Discharge Plan: [place]     Patient Goals and CMS Choice Patient states their goals for this hospitalization and ongoing recovery are:: rehab CMS Medicare.gov Compare Post Acute Care list provided to:: Patient Choice offered to / list presented to : Patient  Expected Discharge Plan and Services Expected Discharge Plan: [place]   Discharge Planning Services: CM Consult   Living arrangements for the past 2 months: Single Family Home                   DME Agency: NA       HH Arranged: NA          Prior Living Arrangements/Services Living arrangements for the past 2 months: Single Family Home Lives with:: Spouse Patient language and need for interpreter reviewed:: Yes Do you feel safe going back to the place where you live?: Yes      Need for Family Participation in Patient Care: Yes (Comment) Care giver support system in place?: Yes (comment)   Criminal Activity/Legal Involvement Pertinent to Current Situation/Hospitalization: No - Comment as needed  Activities of Daily Living Home Assistive Devices/Equipment: None ADL Screening (condition at time of admission) Patient's cognitive ability adequate to safely complete daily activities?: No Is  the patient deaf or have difficulty hearing?: No Does the patient have difficulty seeing, even when wearing glasses/contacts?: No Does the patient have difficulty concentrating, remembering, or making decisions?: No Patient able to express need for assistance with ADLs?: Yes Does the patient have difficulty dressing or bathing?: Yes Independently performs ADLs?: No Communication: Independent Dressing (OT): Needs assistance Is this a change from baseline?: Change from baseline, expected to last >3 days Grooming: Needs assistance Is this a change from baseline?: Change from baseline, expected to last >3 days Feeding: Independent Bathing: Needs assistance Is this a change from baseline?: Change from baseline, expected to last >3 days Toileting: Needs assistance Is this a change from baseline?: Change from baseline, expected to last >3days In/Out Bed: Needs assistance Is this a change from baseline?: Change from baseline, expected to last >3 days Walks in Home: Needs assistance Is this a change from baseline?: Change from baseline, expected to last >3 days Does the patient have difficulty walking or climbing stairs?: Yes Weakness of Legs: Left Weakness of Arms/Hands: Left  Permission Sought/Granted   Permission granted to share information with : No              Emotional Assessment Appearance:: Appears stated age Attitude/Demeanor/Rapport: Engaged Affect (typically observed): Accepting Orientation: : Oriented to Self,Oriented to Place,Oriented to  Time,Oriented to Situation Alcohol / Substance Use: Not Applicable Psych Involvement: No (comment)  Admission diagnosis:  Acute CVA (cerebrovascular accident) [facility]) [I63.9] Cerebrovascular  accident (CVA), unspecified mechanism ([place]) [I63.9] Patient Active Problem List   Diagnosis Date Noted  . Acute CVA (cerebrovascular accident) ([place]) 12/24/2020  . Hyperlipidemia 10/24/2020  . Vitamin D deficiency 10/24/2020  . Fibroids  07/21/2020  . S/P TAH (total abdominal hysterectomy) 07/21/2020  . Chronic a-fib ([place]) 05/23/2020  . Hydroureter on right 05/23/2020  . Microalbuminuria 05/13/2020  . Non-ischemic cardiomyopathy ([place])   . Normocytic anemia   . Nephrolithiasis 02/14/2020  . Paroxysmal atrial fibrillation ([place]) 12/18/2019  . 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 Gross hematuria 12/18/2019  . Staghorn renal calculus 12/18/2019  . Chronic kidney disease (CKD), stage IV (severe) ([place]) 12/18/2019  . Umbilical hernia without obstruction and without gangrene 12/18/2019  . Uterine fibroid 10/25/2019  . Right hip pain   . Type 2 diabetes mellitus ([place])   . Essential hypertension   . CHF (congestive heart failure) ([place]) 10/21/2019  . Atrial fibrillation with RVR ([place]) 10/21/2019  . Atrial fibrillation with rapid ventricular response ([place]) 10/20/2019   PCP:  Nicolette Bang, DO Pharmacy:   [facility], [place] [address] [place] [address] Phone: [phone] Fax: [phone]  [facility] at [place], [place] - [address] [place] [address] Phone: [phone] Fax: [phone]     Social Determinants of Health (SDOH) Interventions    Readmission Risk Interventions No flowsheet data found.

## 2020-12-25 NOTE — Consult Note (Signed)
Physical Medicine and Rehabilitation Consult Reason for Consult: Left side weakness with facial droop and slurred speech Referring Physician: [facility]   HPI: Adrienne Lane is a 55 y.o. right-handed female with history of A. fib with RVR maintained on Eliquis, diastolic congestive heart failure, CKD stage III, diabetes mellitus, obesity, hypertension and cardiomyopathy.  Per chart review lives with spouse also mother-in-law and son.  Mobile home 5 steps to entry.  Reportedly independent prior to admission.  Presented 12/25/2019 with left-sided weakness facial droop and slurred speech.  CT/MRI showed acute infarct right hemipons as well as additional small acute infarct left frontal subcortical white matter.  Small remote lacunar infarct in the right corona radiata and left periatrial white matter.  Patient did not receive TPA.  MRA of the head negative.  Echocardiogram pending.  Admission chemistries unremarkable except potassium 3.3 glucose 305 BUN 32 creatinine 2.12, urine drug screen negative.  Patient currently remains on Eliquis prior to admission with the addition of low-dose aspirin.  Maintain on a regular consistency diet.  Therapy evaluations completed with recommendations of physical medicine rehab consult.   Pt reports no pain; LBM this AM- no constipation- voiding well.  Sleeping OK.  Has not been working (no insurance per pt) due to Afib, CHF< etc.  Came into hospital due to drooling/facial droop and L sided weakness.  Review of Systems  Constitutional: Negative for chills and fever.  HENT: Negative for hearing loss.   Eyes: Negative for blurred vision and double vision.  Respiratory: Negative for cough and shortness of breath.   Cardiovascular: Positive for palpitations and leg swelling. Negative for chest pain.  Gastrointestinal: Positive for constipation. Negative for heartburn, nausea and vomiting.  Genitourinary: Negative for dysuria, flank pain and hematuria.   Musculoskeletal: Positive for joint pain and myalgias.  Skin: Negative for rash.  All other systems reviewed and are negative.  Past Medical History:  Diagnosis Date  . Atrial fibrillation with RVR ([place])   . [place] 01/2020  . CHF (congestive heart failure) ([place]) 10/20/2019  . CKD (chronic kidney disease), stage IV ([place])   . Diabetes mellitus without complication ([place])   . DOE (dyspnea on exertion)    walking upstairs or up hill resolves in one minute  . Fibroids   . History of kidney stones   . History of recent blood transfusion 02/26/2020  . Hypertension   . Iron deficiency anemia   . Non-ischemic cardiomyopathy ([facility])    tachycardia induced  . Obese   . Peripheral edema   . Premature ventricular contractions (PVCs) ([facility])   . Umbilical hernia   . Wears glasses    Past Surgical History:  Procedure Laterality Date  . CHOLECYSTECTOMY    . CYSTOSCOPY W/ URETERAL STENT PLACEMENT Bilateral 05/24/2020   Procedure: CYSTOSCOPY WITH RETROGRADE PYELOGRAM/URETERAL STENT PLACEMENT;  Surgeon: Robley Fries, MD;  Location: [facility];  Service: Urology;  Laterality: Bilateral;  . CYSTOSCOPY/RETROGRADE/URETEROSCOPY Bilateral 04/03/2020   Procedure: CYSTOSCOPY/RETROGRADE/URETEROSCOPY;  Surgeon: Ardis Hughs, MD;  Location: [facility];  Service: Urology;  Laterality: Bilateral;  . HYSTERECTOMY ABDOMINAL WITH SALPINGO-OOPHORECTOMY  07/21/2020   Procedure: HYSTERECTOMY ABDOMINAL WITH BILATERAL SALPINGO-OOPHORECTOMY;  Surgeon: Sanjuana Kava, MD;  Location: [facility];  Service: Gynecology;;  . IR FLUORO GUIDE CV LINE RIGHT  02/21/2020  . IR US GUIDE VASC ACCESS RIGHT  02/21/2020  . NEPHROLITHOTOMY Left 02/14/2020   Procedure: NEPHROLITHOTOMY PERCUTANEOUS/ SURGEON ACCESS/ LEFT PERCUTANEOUS NEPHROSTOMY TUBE PLACEMENT;  Surgeon: Ardis Hughs, MD;  Location:  [facility];  Service: Urology;  Laterality: Left;  . NEPHROLITHOTOMY Left 02/21/2020   Procedure: NEPHROLITHOTOMY PERCUTANEOUS SECOND LOOK;  Surgeon:  Ardis Hughs, MD;  Location: [facility];  Service: Urology;  Laterality: Left;  . NEPHROLITHOTOMY Left 02/26/2020   Procedure: NEPHROLITHOTOMY PERCUTANEOUS;  Surgeon: Ceasar Mons, MD;  Location: [facility];  Service: Urology;  Laterality: Left;  NEED 150 MIN  . NEPHROLITHOTOMY Right 03/24/2020   Procedure: NEPHROLITHOTOMY PERCUTANEOUS WITH ACCESS LEFT STENT REMOVAL;  Surgeon: Ardis Hughs, MD;  Location: [facility];  Service: Urology;  Laterality: Right;  . RIGHT HEART CATH N/A 11/09/2019   Procedure: RIGHT HEART CATH;  Surgeon: Jolaine Artist, MD;  Location: [facility];  Service: Cardiovascular;  Laterality: N/A;  . WISDOM TOOTH EXTRACTION     Family History  Problem Relation Age of Onset  . Atrial fibrillation Mother   . Hypertension Mother   . Stroke Mother 46  . Diabetes Mother   . Diabetes Father   . Hypertension Father   . Diabetes Sister   . Hypertension Sister   . Diabetes Brother   . Hypertension Brother   . Diabetes Brother   . Heart attack Brother   . Stroke Maternal Grandmother 47   Social History:  reports that she has never smoked. She has never used smokeless tobacco. She reports that she does not drink alcohol and does not use drugs. Allergies:  Allergies  Allergen Reactions  . Adhesive [Tape]     Tears skin, can tolerate paper tape   Medications Prior to Admission  Medication Sig Dispense Refill  . amLODipine (NORVASC) 5 MG tablet Take 1 tablet (5 mg total) by mouth daily. 30 tablet 6  . carvedilol (COREG) 25 MG tablet Take 1 tablet (25 mg total) by mouth 2 (two) times daily with a meal. 180 tablet 3  . dapagliflozin propanediol (FARXIGA) 10 MG TABS tablet Take 1 tablet (10 mg total) by mouth daily before breakfast. 30 tablet 6  . ELIQUIS 5 MG TABS tablet TAKE 1 TABLET (5 MG TOTAL) BY MOUTH 2 (TWO) TIMES DAILY. 60 tablet 6  . hydrALAZINE (APRESOLINE) 100 MG tablet TAKE 1 TABLET (100 MG TOTAL) BY MOUTH EVERY 8 (EIGHT) HOURS. 90 tablet 6   . insulin glargine (LANTUS) 100 UNIT/ML injection Inject 0.2 mLs (20 Units total) into the skin every morning. 10 mL 2  . isosorbide mononitrate (IMDUR) 60 MG 24 hr tablet TAKE 1 TABLET (60 MG TOTAL) BY MOUTH DAILY. 30 tablet 6  . mexiletine (MEXITIL) 150 MG capsule TAKE 2 CAPSULES (300 MG TOTAL) BY MOUTH EVERY 12 (TWELVE) HOURS. 120 capsule 6  . Multiple Vitamins-Minerals (WOMENS MULTI PO) Take 1 tablet by mouth daily.    . potassium chloride (KLOR-CON) 10 MEQ tablet Take 4 tablets (40 mEq total) by mouth daily. (Patient taking differently: Take 20 mEq by mouth 2 (two) times daily.) 120 tablet 6  . torsemide (DEMADEX) 20 MG tablet TAKE 2 TABLETS (40 MG TOTAL) BY MOUTH 2 (TWO) TIMES DAILY. 120 tablet 6  . glucose blood test strip Use as instructed 100 each 12  . Lancets (ONETOUCH ULTRASOFT) lancets Check CBG twice a day 60 each 5  . TRUEPLUS INSULIN SYRINGE 30G X 5/16" 0.5 ML MISC USE TO INJECT 12 UNITS AT BEDTIME. 100 each 6    Home: Home Living Family/patient expects to be discharged to:: Private residence Living Arrangements: Spouse/significant other,Other (Comment) (also mother in law, and son) Available Help at Discharge: Family,Available 24  hours/day Type of Home: Mobile home Home Access: Stairs to enter Entrance Stairs-Number of Steps: 5 Entrance Stairs-Rails: Can reach both Home Layout: One level Bathroom Shower/Tub: Walk-in shower (two steps up to get into shower) Biochemist, clinical: Standard Home Equipment: None Additional Comments: works as a Engineer, manufacturing systems History: Prior Function Level of Independence: Independent Functional Status:  Mobility: Bed Mobility Overal bed mobility: Needs Assistance Bed Mobility: Supine to Sit Supine to sit: HOB elevated,Modified independent (Device/Increase time) General bed mobility comments: technically supervision for safety, but able to get to EOB with Mod(I) with HOB maximally elevated and use of bed  rails Transfers Overall transfer level: Needs assistance Equipment used: Ambulation equipment used Transfer via Lift Equipment: Stedy Transfers: Sit to/from [employer] to Stand: Min assist Stand pivot transfers: Total assist General transfer comment: made multiple attempts to stand with RW with MaxAx1 and increasingly elevated surface, unable to clear hips from bed so switched to stedy- able to perform sit to stand transfers with MinA for balance and steadying in this device Ambulation/Gait General Gait Details: unable    ADL:    Cognition: Cognition Overall Cognitive Status: Within Functional Limits for tasks assessed Orientation Level: Oriented X4 Cognition Arousal/Alertness: Awake/alert Behavior During Therapy: WFL for tasks assessed/performed Overall Cognitive Status: Within Functional Limits for tasks assessed  Blood pressure 137/71, pulse 65, temperature 98.4 F (36.9 C), temperature source Oral, resp. rate 18, SpO2 100 %. Physical Exam Vitals and nursing note reviewed. Exam conducted with a chaperone present.  Constitutional:      Comments: Sitting up in bed; appears younger than stated age, daughter in law at bedside, and RN in and out, appropriate, NAD  HENT:     Head: Normocephalic and atraumatic.     Comments: Trace L facial droop- does better/equalizes with smile; tongue midline- does has slight lisp with some words    Right Ear: External ear normal.     Left Ear: External ear normal.     Nose: Nose normal. No congestion.     Mouth/Throat:     Mouth: Mucous membranes are moist.     Pharynx: Oropharynx is clear. No oropharyngeal exudate.  Eyes:     General:        Right eye: No discharge.        Left eye: No discharge.     Extraocular Movements: Extraocular movements intact.     Conjunctiva/sclera: Conjunctivae normal.     Comments: No nystagmus- EOMI B/L  Cardiovascular:     Comments: Although has Afib, sounds for 30_ seconds to be in  Regular rate and rhythm, not irregular right now Pulmonary:     Comments: CTA B/L- no W/R/R- good air movement Abdominal:     Comments: Soft, NT, ND, (+)BS - hypoactive- just finished lunch- didn't eat bread on tray  Musculoskeletal:     Cervical back: Normal range of motion. No rigidity.     Comments: RUE 5/5 in biceps, triceps, WE grip and finger abd LUE- biceps 4-/5, triceps 4-/5, WE 3-/5, grip 2+/5, and finger abd 1/5 RLE_ 5/5 in HF, KE, KF DF and PF LLE- HF and KE 4-/5, KF 4-/5, DF and PF 4/5   Skin:    General: Skin is warm and dry.     Comments: No skin breakdown on heels or extremities B/L  Neurological:     Mental Status: She is oriented to person, place, and time.     Comments: Patient is alert no acute  distress.  Speech is a bit dysarthric but intelligible.  Oriented x3 and follows commands.  Ox3- sitting up in bed- slight lisp occ; but not all the time- facial sensation and all 4 extremities intact to light touch Not able to test- Rapid alternating movements, or finger to nose on left due to weakness  Psychiatric:        Mood and Affect: Mood normal.        Behavior: Behavior normal.     Results for orders placed or performed during the hospital encounter of 12/24/20 (from the past 24 hour(s))  Resp Panel by RT-PCR (Flu A&B, Covid) Nasopharyngeal Swab     Status: None   Collection Time: 12/24/20  4:18 PM   Specimen: Nasopharyngeal Swab; Nasopharyngeal(NP) swabs in vial transport medium  Result Value Ref Range   SARS Coronavirus 2 by RT PCR NEGATIVE NEGATIVE   Influenza A by PCR NEGATIVE NEGATIVE   Influenza B by PCR NEGATIVE NEGATIVE  Urine rapid drug screen (hosp performed)not at [facility]     Status: None   Collection Time: 12/24/20  6:59 PM  Result Value Ref Range   Opiates NONE DETECTED NONE DETECTED   Cocaine NONE DETECTED NONE DETECTED   Benzodiazepines NONE DETECTED NONE DETECTED   Amphetamines NONE DETECTED NONE DETECTED   Tetrahydrocannabinol NONE DETECTED  NONE DETECTED   Barbiturates NONE DETECTED NONE DETECTED  Glucose, capillary     Status: Abnormal   Collection Time: 12/24/20  9:26 PM  Result Value Ref Range   Glucose-Capillary 240 (H) 70 - 99 mg/dL  Hemoglobin A1c     Status: Abnormal   Collection Time: 12/25/20  2:02 AM  Result Value Ref Range   Hgb A1c MFr Bld 8.0 (H) 4.8 - 5.6 %   Mean Plasma Glucose 182.9 mg/dL  Lipid panel     Status: Abnormal   Collection Time: 12/25/20  2:02 AM  Result Value Ref Range   Cholesterol 260 (H) 0 - 200 mg/dL   Triglycerides 141 <150 mg/dL   HDL 59 >40 mg/dL   Total CHOL/HDL Ratio 4.4 RATIO   VLDL 28 0 - 40 mg/dL   LDL Cholesterol 173 (H) 0 - 99 mg/dL  TSH     Status: None   Collection Time: 12/25/20  2:02 AM  Result Value Ref Range   TSH 1.712 0.350 - 4.500 uIU/mL  Glucose, capillary     Status: Abnormal   Collection Time: 12/25/20  6:17 AM  Result Value Ref Range   Glucose-Capillary 148 (H) 70 - 99 mg/dL   CT HEAD WO CONTRAST  Result Date: 12/24/2020 CLINICAL DATA:  Neuro deficit, acute stroke suspected. Abnormal gait and aphasia stray. Today's having left-sided weakness. EXAM: CT HEAD WITHOUT CONTRAST TECHNIQUE: Contiguous axial images were obtained from the base of the skull through the vertex without intravenous contrast. COMPARISON:  None. FINDINGS: Brain: Small area of hypoattenuation in the left periatrial white matter (see series 5, image 44 and series 3, image 15). Additional small area of hypoattenuation in the right corona radiata. No acute hemorrhage. No hydrocephalus. No mass lesion. No abnormal mass effect. No midline shift. No extra-axial fluid collection. Vascular: No hyperdense vessel identified. Calcific atherosclerosis. Skull: No acute fracture. Punctate foreign body in the subcutaneous high posterior left scalp without soft tissue swelling. Sinuses/Orbits: No acute finding. Other: No mastoid effusions. IMPRESSION: 1. Small area of hypoattenuation in the left periatrial white  matter could represent chronic microvascular ischemic disease or age indeterminate lacunar infarct. Additional age  indeterminate lacunar infarct within the right corona radiata, favored remote. If there is concern for acute infarct, MRI could further evaluate. 2. No acute hemorrhage. 3. Punctate foreign body in the subcutaneous high posterior left scalp, likely remote given no scalp swelling. Electronically Signed   By: Margaretha Sheffield MD   On: 12/24/2020 12:30   MR ANGIO HEAD WO CONTRAST  Result Date: 12/24/2020 CLINICAL DATA:  Follow-up examination for acute stroke. EXAM: MRA HEAD WITHOUT CONTRAST TECHNIQUE: Angiographic images of the Circle of Willis were obtained using MRA technique without intravenous contrast. COMPARISON:  Prior MRI from earlier the same day. FINDINGS: ANTERIOR CIRCULATION: Visualized distal cervical segments of the internal carotid arteries are widely patent with symmetric antegrade flow. Petrous, cavernous, and supraclinoid segments widely patent without stenosis or other abnormality. A1 segments widely patent. Normal anterior communicating artery complex. Anterior cerebral arteries widely patent to their distal aspects. No M1 stenosis or occlusion. Normal MCA bifurcations. Distal MCA branches well perfused and symmetric. POSTERIOR CIRCULATION: Both V4 segments widely patent to the vertebrobasilar junction without stenosis. Left vertebral artery dominant. Both PICA origins patent and normal. Basilar widely patent to its distal aspect. Superior cerebellar arteries patent bilaterally. Both PCAs supplied via the basilar as well as small bilateral posterior communicating arteries. PCAs well perfused to their distal aspects without stenosis. No intracranial aneurysm or other vascular abnormality. IMPRESSION: Normal intracranial MRA. No large vessel occlusion, hemodynamically significant stenosis, or other acute vascular abnormality. Electronically Signed   By: Jeannine Boga M.D.   On:  12/24/2020 22:40   MR BRAIN WO CONTRAST  Result Date: 12/24/2020 CLINICAL DATA:  Neuro deficit, acute stroke suspected. EXAM: MRI HEAD WITHOUT CONTRAST TECHNIQUE: Multiplanar, multiecho pulse sequences of the brain and surrounding structures were obtained without intravenous contrast. COMPARISON:  Same day head CT. FINDINGS: Brain: Acute infarct within the right hemi pons. Mild associated edema without substantial mass effect. Small acute infarct in the left frontal subcortical white matter (see series 2 and 250, image 29) without associated edema. Small remote lacunar infarcts in the right corona radiata and left periatrial white matter. No hydrocephalus. No acute hemorrhage. No mass lesion. No extra-axial fluid collection. Vascular: Major arterial flow voids are maintained at the skull base. Skull and upper cervical spine: Normal marrow signal. Sinuses/Orbits: Sinuses are clear.  Unremarkable orbits. IMPRESSION: 1. Acute infarct in the right hemipons. Additional small acute infarct in the left frontal subcortical white matter. 2. Mild associated edema in the right pons without substantial mass effect. 3. Small remote lacunar infarcts in the right corona radiata and left periatrial white matter. Findings discussed with Dr. Shelly Coss via telephone at 2:02 PM. Electronically Signed   By: Margaretha Sheffield MD   On: 12/24/2020 14:05   VAS US CAROTID  Result Date: 12/25/2020 Carotid Arterial Duplex Study Indications:   CVA and Left side weakness, left facial drrop, speech                disturbance. Risk Factors:  Hypertension, Diabetes, no history of smoking. Other Factors: CKD4, CHF, AFIB, Obesity. Performing Technologist: Rogelia Rohrer  Examination Guidelines: A complete evaluation includes B-mode imaging, spectral Doppler, color Doppler, and power Doppler as needed of all accessible portions of each vessel. Bilateral testing is considered an integral part of a complete examination. Limited examinations for  reoccurring indications may be performed as noted.  Right Carotid Findings: +----------+--------+--------+--------+------------------+--------+           PSV cm/sEDV cm/sStenosisPlaque DescriptionComments +----------+--------+--------+--------+------------------+--------+ CCA Prox  97  11                                         +----------+--------+--------+--------+------------------+--------+ CCA Distal78      28                                         +----------+--------+--------+--------+------------------+--------+ ICA Prox  65      16      1-39%                              +----------+--------+--------+--------+------------------+--------+ ICA Distal109     25                                         +----------+--------+--------+--------+------------------+--------+ ECA       89      0                                          +----------+--------+--------+--------+------------------+--------+ +----------+--------+-------+----------------+-------------------+           PSV cm/sEDV cmsDescribe        Arm Pressure (mmHG) +----------+--------+-------+----------------+-------------------+ [id number]     0      Multiphasic, WNL                    +----------+--------+-------+----------------+-------------------+ +---------+--------+--+--------+--+---------+ VertebralPSV cm/s61EDV cm/s13Antegrade +---------+--------+--+--------+--+---------+  Left Carotid Findings: +----------+--------+--------+--------+------------------+--------+           PSV cm/sEDV cm/sStenosisPlaque DescriptionComments +----------+--------+--------+--------+------------------+--------+ CCA Prox  107     15                                         +----------+--------+--------+--------+------------------+--------+ CCA Distal84      18                                         +----------+--------+--------+--------+------------------+--------+ ICA Prox  89       25      1-39%                              +----------+--------+--------+--------+------------------+--------+ ICA Distal93      28                                         +----------+--------+--------+--------+------------------+--------+ ECA       64      0                                          +----------+--------+--------+--------+------------------+--------+ +----------+--------+--------+----------------+-------------------+           PSV cm/sEDV cm/sDescribe        Arm Pressure (mmHG) +----------+--------+--------+----------------+-------------------+  [id number]      11      Multiphasic, WNL                    +----------+--------+--------+----------------+-------------------+ +---------+--------+--+--------+--+---------+ VertebralPSV cm/s63EDV cm/s19Antegrade +---------+--------+--+--------+--+---------+   Summary: Right Carotid: Velocities in the right ICA are consistent with a 1-39% stenosis.                The extracranial vessels were near-normal with only minimal wall                thickening or plaque. Left Carotid: Velocities in the left ICA are consistent with a 1-39% stenosis.               The extracranial vessels were near-normal with only minimal wall               thickening or plaque. Vertebrals:  Bilateral vertebral arteries demonstrate antegrade flow. Subclavians: Normal flow hemodynamics were seen in bilateral subclavian              arteries. *See table(s) above for measurements and observations.     Preliminary      Assessment/Plan: Diagnosis: R pontine stroke with L hemiparesis and L facial droop/on regular DM diet 1. Does the need for close, 24 hr/day medical supervision in concert with the patient's rehab needs make it unreasonable for this patient to be served in a less intensive setting? Yes 2. Co-Morbidities requiring supervision/potential complications: sCHF, DM with A1c of 8.0, CKD stage IV, Afib with hx of RVR, HTN, and HLD.   3. Due to bladder management, safety, skin/wound care, disease management, medication administration, pain management and patient education, does the patient require 24 hr/day rehab nursing? Yes 4. Does the patient require coordinated care of a physician, rehab nurse, therapy disciplines of PT and OT to address physical and functional deficits in the context of the above medical diagnosis(es)? Yes Addressing deficits in the following areas: balance, endurance, locomotion, strength, transferring, bathing, dressing, feeding, grooming and toileting 5. Can the patient actively participate in an intensive therapy program of at least 3 hrs of therapy per day at least 5 days per week? Yes 6. The potential for patient to make measurable gains while on inpatient rehab is excellent 7. Anticipated functional outcomes upon discharge from inpatient rehab are modified independent and supervision  with PT, modified independent, supervision and min assist with OT, n/a with SLP. 8. Estimated rehab length of stay to reach the above functional goals is: 14-18 days 9. Anticipated discharge destination: Home 10. Overall Rehab/Functional Prognosis: excellent  RECOMMENDATIONS: This patient's condition is appropriate for continued rehabilitative care in the following setting: CIR Patient has agreed to participate in recommended program. Yes Note that insurance prior authorization may be required for reimbursement for recommended care.  Comment:  1. Went over issues that can affect stroke pt's including 40% of stroke patients at risk for developing spasticity and how to do contraint induced therapy.  2. Explained spasticity- 40% of stroke pt's- muscle spasms and tightness 6-12 weeks after stroke usually- always by 6 months if will develop it- explained need to see PM&R doctors to treat here in [place], and to not "let it go"- because can continue to get worse over time.  3. Went over contraint induced therapy- usually  wait until out of CIR- but then if muscles are at least 2/5 in LUE, can cover RUE with oven mitt, etc, and keep using LUE for 6+ hours/day to improve strength/fine motor dexterity  and ability to use LUE- can do for up to 5-10 years down the road.  4. Will submit for CIR admission- explained will likely need to reapply for medicaid, and discussed how CIR/acute hospital SW could help.  5. Thank you for this consult  I spent a total of 80 minutes doing consult-  >50% on coordination/care. 20 minutes reviewing chart- 40 minutes educating and seeing pt/daughter in law- and 20 minutes typing up consult.       Lavon Paganini Angiulli, PA-C 12/25/2020    I have personally performed a face to face diagnostic evaluation of this patient and formulated the key components of the plan.  Additionally, I have personally reviewed laboratory data, imaging studies, as well as relevant notes and concur with the physician assistant's documentation above.

## 2020-12-25 NOTE — Care Management (Signed)
Scheduled follow up with Primary Care at [facility] Can assist with discharge medications through [facility], she can use their pharmacy for prescriptions after discharge.   Magdalen Spatz RN

## 2020-12-25 NOTE — Evaluation (Signed)
Physical Therapy Evaluation Patient Details Name: Adrienne Lane MRN: [id number] DOB: [birth year] Today's Date: 12/25/2020   History of Present Illness  55yo female c/o L sided weakness, facial droop with slurred speech, and gait difficulty. MRI shows R pontine infarct, small infarct in L frontal subcortical white matter, and small remote lacunar infarcts in the R corona radiata and L periatrial white matter. PMH A-fib with RVR, CHF, CKD, DM, obesity, HTN, cardiomyopathy  Clinical Impression   Patient received in bed, very pleasant and motivated to participate with therapy. Cognition intact and followed cues well. Still has definite weakness on L side with L UE more affected than L LE. Made multiple attempts to stand with RW and MaxAx1, but unable so ultimately required stedy for safe transfers today. Also demonstrates significant impairment in general proprioception and motor control L UE/LE in particular. Left on bedside commode with NT present and attending. Will definitely benefit from intensive therapies in the CIR setting moving forward.     Follow Up Recommendations CIR;Supervision for mobility/OOB    Equipment Recommendations  Rolling walker with 5" wheels;3in1 (PT);Wheelchair (measurements PT);Wheelchair cushion (measurements PT)    Recommendations for Other Services       Precautions / Restrictions Precautions Precautions: Fall;Other (comment) Precaution Comments: L sided weakness Restrictions Weight Bearing Restrictions: No      Mobility  Bed Mobility Overal bed mobility: Needs Assistance Bed Mobility: Supine to Sit     Supine to sit: HOB elevated;Modified independent (Device/Increase time)     General bed mobility comments: technically supervision for safety, but able to get to EOB with Mod(I) with HOB maximally elevated and use of bed rails    Transfers Overall transfer level: Needs assistance Equipment used: Ambulation equipment used Transfers: Sit to/from  [facility] Sit to Stand: Min assist Stand pivot transfers: Total assist       General transfer comment: made multiple attempts to stand with RW with MaxAx1 and increasingly elevated surface, unable to clear hips from bed so switched to stedy- able to perform sit to stand transfers with MinA for balance and steadying in this device  Ambulation/Gait             General Gait Details: unable  Stairs            Wheelchair Mobility    Modified Rankin (Stroke Patients Only)       Balance Overall balance assessment: Needs assistance Sitting-balance support: Bilateral upper extremity supported;Feet supported Sitting balance-Leahy Scale: Fair Sitting balance - Comments: posterior bias with dynamic activities Postural control: Posterior lean Standing balance support: Bilateral upper extremity supported;During functional activity Standing balance-Leahy Scale: Poor Standing balance comment: reliant on BUE support                             Pertinent Vitals/Pain Pain Assessment: No/denies pain    Home Living Family/patient expects to be discharged to:: Private residence Living Arrangements: Spouse/significant other;Other (Comment) (also mother in law, and son) Available Help at Discharge: Family;Available 24 hours/day Type of Home: Mobile home Home Access: Stairs to enter Entrance Stairs-Rails: Can reach both Entrance Stairs-Number of Steps: 5 Home Layout: One level Home Equipment: None 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    Prior Function Level of Independence: Independent               Hand Dominance   Dominant Hand: Right    Extremity/Trunk Assessment   Upper Extremity Assessment Upper  Extremity Assessment: Defer to OT evaluation    Lower Extremity Assessment Lower Extremity Assessment: RLE deficits/detail;LLE deficits/detail RLE Deficits / Details: WNL RLE Sensation: WNL RLE Coordination: WNL LLE  Deficits / Details: ankle dorsiflexion 4/5, quad 4/5, hip flexor 2/5 LLE Sensation: decreased proprioception LLE Coordination: decreased gross motor    Cervical / Trunk Assessment Cervical / Trunk Assessment: Normal  Communication   Communication: No difficulties  Cognition Arousal/Alertness: Awake/alert Behavior During Therapy: WFL for tasks assessed/performed Overall Cognitive Status: Within Functional Limits for tasks assessed                                        General Comments      Exercises     Assessment/Plan    PT Assessment Patient needs continued PT services  PT Problem List Decreased strength;Obesity;Decreased knowledge of use of DME;Decreased activity tolerance;Decreased safety awareness;Decreased balance;Decreased mobility;Decreased coordination;Impaired sensation       PT Treatment Interventions DME instruction;Balance training;Gait training;Neuromuscular re-education;Stair training;Functional mobility training;Patient/family education;Therapeutic activities;Therapeutic exercise;Wheelchair mobility training    PT Goals (Current goals can be found in the Care Plan section)  Acute Rehab PT Goals Patient Stated Goal: go to rehab PT Goal Formulation: With patient Time For Goal Achievement: 01/08/21 Potential to Achieve Goals: Good    Frequency Min 4X/week   Barriers to discharge        Co-evaluation               AM-PAC PT "6 Clicks" Mobility  Outcome Measure Help needed turning from your back to your side while in a flat bed without using bedrails?: A Little Help needed moving from lying on your back to sitting on the side of a flat bed without using bedrails?: A Little Help needed moving to and from a bed to a chair (including a wheelchair)?: A Lot Help needed standing up from a chair using your arms (e.g., wheelchair or bedside chair)?: Total Help needed to walk in hospital room?: Total Help needed climbing 3-5 steps with a  railing? : Total 6 Click Score: 11    End of Session Equipment Utilized During Treatment: Gait belt Activity Tolerance: Patient tolerated treatment well Patient left: Other (comment) (on BSC with NT present and attending) Nurse Communication: Mobility status;Need for lift equipment PT Visit Diagnosis: Unsteadiness on feet (R26.81);Difficulty in walking, not elsewhere classified (R26.2);Hemiplegia and hemiparesis;Muscle weakness (generalized) (M62.81);Other symptoms and signs involving the nervous system (R29.898);Other abnormalities of gait and mobility (R26.89) Hemiplegia - Right/Left: Left Hemiplegia - dominant/non-dominant: Non-dominant Hemiplegia - caused by: Cerebral infarction    Time: 5681-2751 PT Time Calculation (min) (ACUTE ONLY): 33 min   Charges:   PT Evaluation $PT Eval Moderate Complexity: 1 Mod PT Treatments $Therapeutic Activity: 8-22 mins        Windell Norfolk, DPT, PN1   Supplemental Physical Therapist [place]    Pager [phone] Acute Rehab Office [phone]

## 2020-12-25 NOTE — Progress Notes (Signed)
ANTICOAGULATION CONSULT NOTE - Initial Consult  Pharmacy Consult to switch from Eliquis to Pradaxa Indication: atrial fibrillation  Allergies  Allergen Reactions  . Adhesive [Tape]     Tears skin, can tolerate paper tape    Patient Measurements: Height: 5\' 8"  (172.7 cm) (from 07/21/2020) Weight: 101.6 kg (224 lb) (from 10/23/2020) IBW/kg (Calculated) : 63.9   Vital Signs: Temp: 98.1 F (36.7 C) (01/06 1208) Temp Source: Oral (01/06 1208) BP: 135/80 (01/06 1208) Pulse Rate: 57 (01/06 1208)  Labs: Recent Labs    12/24/20 1034  HGB 13.0  HCT 39.8  PLT 208  APTT 36  LABPROT 15.4*  INR 1.3*  CREATININE Adrienne Lane*    Estimated Creatinine Clearance: 38.7 mL/min (A) (by C-G formula based on SCr of Adrienne Lane mg/dL (H)).   Medical History: Past Medical History:  Diagnosis Date  . Atrial fibrillation with RVR ([place])   . [place] 01/2020  . CHF (congestive heart failure) ([place]) 10/20/2019  . Adrienne (chronic kidney disease), stage IV ([place])   . Diabetes mellitus without complication ([place])   . DOE (dyspnea on exertion)    walking upstairs or up hill resolves in one minute  . Fibroids   . History of kidney stones   . History of recent blood transfusion 02/26/2020  . Hypertension   . Iron deficiency anemia   . Non-ischemic cardiomyopathy (H[facility])    tachycardia induced  . Obese   . Peripheral edema   . Premature ventricular contractions (PVCs) (VPCs)   . Umbilical hernia   . Wears glasses     Medications:  Medications Prior to Admission  Medication Sig Dispense Refill Last Dose  . amLODipine (NORVASC) 5 MG tablet Take 1 tablet (5 mg total) by mouth daily. 30 tablet 6 12/24/2020 at Unknown time  . carvedilol (COREG) 25 MG tablet Take 1 tablet (25 mg total) by mouth 2 (two) times daily with a meal. 180 tablet 3 12/24/2020 at 0730  . dapagliflozin propanediol (FARXIGA) 10 MG TABS tablet Take 1 tablet (10 mg total) by mouth daily before breakfast. 30 tablet 6 12/24/2020 at Unknown time  .  ELIQUIS 5 MG TABS tablet TAKE 1 TABLET (5 MG TOTAL) BY MOUTH 2 (TWO) TIMES DAILY. 60 tablet 6 12/24/2020 at 0730  . hydrALAZINE (APRESOLINE) 100 MG tablet TAKE 1 TABLET (100 MG TOTAL) BY MOUTH EVERY 8 (EIGHT) HOURS. 90 tablet 6 12/24/2020 at Unknown time  . insulin glargine (LANTUS) 100 UNIT/ML injection Inject 0.2 mLs (20 Units total) into the skin every morning. 10 mL 2 12/24/2020 at Unknown time  . isosorbide mononitrate (IMDUR) 60 MG 24 hr tablet TAKE 1 TABLET (60 MG TOTAL) BY MOUTH DAILY. 30 tablet 6 12/23/2020 at Unknown time  . mexiletine (MEXITIL) 150 MG capsule TAKE 2 CAPSULES (300 MG TOTAL) BY MOUTH EVERY 12 (TWELVE) HOURS. 120 capsule 6 12/24/2020 at Unknown time  . Multiple Vitamins-Minerals (WOMENS MULTI PO) Take 1 tablet by mouth daily.   12/24/2020 at Unknown time  . potassium chloride (KLOR-CON) 10 MEQ tablet Take 4 tablets (40 mEq total) by mouth daily. (Patient taking differently: Take 20 mEq by mouth 2 (two) times daily.) 120 tablet 6 12/24/2020 at Unknown time  . torsemide (DEMADEX) 20 MG tablet TAKE 2 TABLETS (40 MG TOTAL) BY MOUTH 2 (TWO) TIMES DAILY. 120 tablet 6 12/24/2020 at Unknown time  . glucose blood test strip Use as instructed 100 each 12   . Lancets (ONETOUCH ULTRASOFT) lancets Check CBG twice a day 60 each 5   .  TRUEPLUS INSULIN SYRINGE 30G X 5/16" 0.5 ML MISC USE TO INJECT 12 UNITS AT BEDTIME. 100 each 6     Assessment: 55 y.o female with history of PAF on Eliquis PTA  presented 12/24/20 presenting with Adrienne Lane, Adrienne Adrienne Lane  ,Adrienne Adrienne Lane,   Adrienne ~ 38.7 ml/min CBC wnl  (1/5)    Goal of Therapy:  Stroke prevention Monitor platelets by anticoagulation protocol: Yes   Plan:  Discontinue Eliquis,  Start  Pradaxa 150 mg PO twice daily.  watch scr,  if Adrienne 15-30 ml/min will need to reduce dose to  75 bid.  Needs education.  AVS posted [facility] consult ordered for benefits check for pradaxa  Nicole Cella, [place] Clinical Pharmacist [phone] Please check AMION for all [facility] phone numbers After 10:00 PM, call [place] [phone] 12/25/2020,3:08 PM

## 2020-12-25 NOTE — Progress Notes (Signed)
Inpatient Rehab Admissions Coordinator Note:   Per PT recommendations, pt was screened for CIR candidacy by Shann Medal, PT, DPT.  At this time we are recommending a CIR consult and I will place an order per our protocol.  Please contact me with questions.   Shann Medal, PT, DPT [phone] 12/25/20 11:25 AM

## 2020-12-25 NOTE — Progress Notes (Addendum)
STROKE TEAM PROGRESS NOTE  HPI per record: Adrienne Lane is a 55 y.o. right handed female with a PMHx of CHF last EF 50%, CKD IV, DM, AF on Eliquis, and non ischemic cardiomyopathy. Stroke in mother. Per ED records, pt woke up on 12/23/20 with floaters OU in peripheral vision. At 0200 12/24/20, she woke up at 0200 hrs with weakness in her LLE with ambulation. States she felt like she was dragging her foot at first. She went back to sleep and wokeup in am with increased LLE and new LUE weakness and had even more difficulty ambulating. States she was clumsy with her left hand. She tried to eat breakfast but was drooling and husband noticed left facial droop. States she could read but had some blurry vision. She is still experiencing floaters on exam. She is on Eliquis at home for AF and pt says she takes it religiously. ED workup revealed [place] with possible age indeterminate lacunar infarct. MRI brain showed right sided pontine infarct with small remote lacunar infarct in the right corona radiata. Neuro was asked to consult. Last PCP visit 10/23/20 with worsened A1c from 7 to 8.4. Last echo 06/16/20 with EF 50-55%, global; hypokinesis of LV, Grade II DD. Last hospitalization here 05/23/20 for AKI.  INTERVAL HISTORY No acute overnight events. Patient evaluated at bedside this morning with her daughter in room. She states she was dragging her feet at home, not able to raise her arm and had difficulty eating her breakfast and husband noticed and told her she might be having stroke and need medical attention. Today she feels better than yesterday but not at her baseline. Discussion about possible cause of stroke is small vessel disease and pt & daughter questioning happening stroke on Eliquis. Explained them, it is not 100% effective and there are other options but no data to prove they are better than Eliquis. Another problem is pt. Is uninsured and pays for Eliquis through community program but case manager is approached  about possibility of community program paying for dabigatran (Pradaxa). She confirmed and it would be covered through community wellness program. Blood pressure well control. Neurological exam-improving and stable.    Vitals:   12/24/20 2036 12/25/20 0035 12/25/20 0456 12/25/20 1208  BP: (!) 157/62 137/73 137/71 135/80  Pulse: 65 62 65 (!) 57  Resp: 17 17 18 18   Temp: 97.9 F (36.6 C) 97.8 F (36.6 C) 98.4 F (36.9 C) 98.1 F (36.7 C)  TempSrc: Oral Oral Oral Oral  SpO2: 99% 97% 100% 96%   CBC:  Recent Labs  Lab 12/24/20 1034  WBC 5.4  NEUTROABS 4.0  HGB 13.0  HCT 39.8  MCV 86.9  PLT 544   Basic Metabolic Panel:  Recent Labs  Lab 12/24/20 1034  NA 139  K 3.3*  CL 102  CO2 24  GLUCOSE 305*  BUN 32*  CREATININE 2.12*  CALCIUM 9.4   Lipid Panel:  Recent Labs  Lab 12/25/20 0202  CHOL 260*  TRIG 141  HDL 59  CHOLHDL 4.4  VLDL 28  LDLCALC 173*   HgbA1c:  Recent Labs  Lab 12/25/20 0202  HGBA1C 8.0*   Urine Drug Screen:  Recent Labs  Lab 12/24/20 1859  LABOPIA NONE DETECTED  COCAINSCRNUR NONE DETECTED  LABBENZ NONE DETECTED  AMPHETMU NONE DETECTED  THCU NONE DETECTED  LABBARB NONE DETECTED     IMAGING past 24 hours  CT Head  IMPRESSION: 1. Small area of hypoattenuation in the left periatrial white matter could  represent chronic microvascular ischemic disease or age indeterminate lacunar infarct. Additional age indeterminate lacunar infarct within the right corona radiata, favored remote. If there is concern for acute infarct, MRI could further evaluate. 2. No acute hemorrhage. 3. Punctate foreign body in the subcutaneous high posterior left scalp, likely remote given no scalp swelling.  MR Brain wo  IMPRESSION: 1. Acute infarct in the right hemipons. Additional small acute infarct in the left frontal subcortical white matter. 2. Mild associated edema in the right pons without substantial mass effect. 3. Small remote lacunar infarcts in  the right corona radiata and left periatrial white matter.  MR Angio Head  IMPRESSION: Normal intracranial MRA. No large vessel occlusion, hemodynamically significant stenosis, or other acute vascular abnormality.  Echocardiogram  IMPRESSIONS    1. Since the last study on 06/16/20 LVEF remains mildly decreased with  diffuse hypokinesis and LVEF 45-50%. RVEF is mildly decreased, RV is now  less dilated and RVSP has improved from 50 to 30 mmHg.  2. Left ventricular ejection fraction, by estimation, is 45 to 50%. The  left ventricle has mildly decreased function. The left ventricle  demonstrates global hypokinesis. Left ventricular diastolic function could  not be evaluated.  3. Right ventricular systolic function is mildly reduced. The right  ventricular size is normal. There is normal pulmonary artery systolic  pressure. The estimated right ventricular systolic pressure is 47.6 mmHg.  4. Left atrial size was mildly dilated.  5. The mitral valve is normal in structure. Mild mitral valve  regurgitation. No evidence of mitral stenosis.  6. The aortic valve is normal in structure. Aortic valve regurgitation is  not visualized. No aortic stenosis is present.  7. The inferior vena cava is normal in size with <50% respiratory  variability, suggesting right atrial pressure of 8 mmHg.     PHYSICAL EXAM  General: Obese, middle age African-American female sitting comfortably in bed, NAD CV: RRR, S1, S2 heard, No m/r/g Pulmonary: Clear to ausculation bilaterally. No wheezes, no rales Abdomen: Soft, no rigidity, no abdominal tenderness, BS+ Psych: Pleasant mood and affect Neurological:  Mental Status: Alert, oriented, thought content appropriate.  Speech fluent with mild dysarthria.  Able to follow 3 step commands without difficulty. Cranial Nerves: II:  Visual fields grossly normal, pupils equal, round, reactive to light and accommodation III,IV, VI: ptosis not present,  extra-ocular motions intact bilaterally V,VII: smile asymmetric with left facial droop, facial light touch sensation normal bilaterally VIII: hearing normal bilaterally IX,X: uvula rises symmetrically XI: bilateral shoulder shrug XII: midline tongue extension without atrophy or fasciculations  Motor: Right : Upper extremity   5/5    Left:     Upper extremity   3/5 with drift  Lower extremity   5/5     Lower extremity   4/5 , Able to hold both lower extremities for 5 sec but LLE lower than RLE.  Tone and bulk:normal tone throughout; no atrophy noted Sensory: Pinprick and light touch intact throughout, bilaterally Plantars: Right: downgoing   Left: downgoing Cerebellar: normal finger-to-nose,  normal heel-to-shin test Gait: Deferred   NIH stroke scale 3. Premorbid modified Rankin's score 0  ASSESSMENT/PLAN [name] is a 55 y.o. 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   Stroke right pontine lacunar infarct likely from  small vessel disease the patient has a history of atrial fibrillation and was compliant with her Eliquis         CT Head: chronic microvascular ischemic disease or age or indeterminate lacunar infarct, Additional age indeterminate lacunar, infarct within the right corona radiata, favored remote  MR angio Head: Normal intracranial MRA. No large vessel occlusion, hemodynamically significant stenosis, or other acute vascular abnormality.  MRI : Acute infarct in the right hemipons. Additional small acute infarct in the left frontal subcortical white matter. Mild associated edema in the right pons without substantial mass effect. Small remote lacunar infarcts in the right corona radiata and left periatrial white matter.  Carotid US:  Right Carotid: Velocities  in the right ICA are consistent with a 1-39%  Stenosis. Velocities in the left ICA are consistent with a 1-39%  stenosis.   2D Echo: Left ventricular ejection fraction, by estimation, is 45 to 50%.  LDL 173  HgbA1c 8.0  VTE prophylaxis - On Eliquis      Diet   Diet heart healthy/carb modified Room service appropriate? Yes; Fluid consistency: Thin    Eliquis (apixaban) daily prior to admission, now on Eliquis (apixaban) daily .  Therapy recommendations:  Awaited  Disposition:  Awaited  Hypertension  Home meds:  Norvasc, Coreg, Hydralazine   Stable . Permissive hypertension (OK if < 220/120) but gradually normalize in 5-7 days . Long-term BP goal normotensive  Hyperlipidemia  Home meds:  Lipitor 40 mg, increased to Lipitor 80 mg in hospital  LDL 173, goal < 70  Continue statin at discharge  Diabetes type II UncControlled  Home meds:  Lantus  HgbA1c 8.0, goal < 7.0  CBGs Recent Labs    12/24/20 2126 12/25/20 0617 12/25/20 1211  GLUCAP 240* 148* 140*      SSI    Other's:    A-fibrillation  Home meds: rate controlled with Coreg (Hold) and Mexiletine (Resumed)  Plan to d/c Eliquis and change to dabigatran (Pradaxa), pharmacy consult done due to CKD stage 4.   Other Stroke Risk Factors  Obesity, BMI >/= 30 associated with increased stroke risk, recommend weight loss, diet and exercise as appropriate   Coronary artery disease  Obstructive sleep apnea, possibly, would undergo testing tonight  Congestive heart failure  Honor Junes, MD PGY1 Resident  Hospital day # 1 Patient presented with left hemiparesis due to right pontine lacunar infarct with slight fluctuating symptoms. She does have atrial fibrillation and was quite compliant with her Eliquis. Consider possible changing to Pradaxa if it does not  significantly impact her co-pays and she is able to afford it. Continue ongoing stroke work-up and aggressive risk factor modification. Patient  also appears to be at risk for sleep apnea and may consider possible participation in the sleep smart study. Prevention. She was given information to review and decide. Long discussion patient and daughter at the bedside and answered questions. Discussed with Dr. [place] Crane. Greater than 50% time during this 35-minute visit was spent on counseling and coordination of care about her pontine stroke and risk for sleep apnea and answered questions. [name], MD   To contact Stroke Continuity provider, please refer to [email or web address] After hours, contact General Neurology

## 2020-12-25 NOTE — Progress Notes (Signed)
Initial Nutrition Assessment  DOCUMENTATION CODES:   Not applicable  INTERVENTION:  Glucerna Shake po BID, each supplement provides 220 kcal and 10 grams of protein  Encourage adequate PO intake.   Recommend obtaining new weight to fully assess weight trends.   NUTRITION DIAGNOSIS:   Increased nutrient needs related to chronic illness (CHF) as evidenced by estimated needs.  GOAL:   Patient will meet greater than or equal to 90% of their needs  MONITOR:   PO intake,Supplement acceptance,Skin,Weight trends,Labs,I & O's  REASON FOR ASSESSMENT:   Consult Assessment of nutrition requirement/status  ASSESSMENT:   55 y.o. female with medical history significant of obesity; DM; afib; stage IV CKD; nephrolithiasis; and chronic systolic CHF presenting with stroke symptoms. MRI with pontine infarct.  Pt unavailable during attempted time of contact. Pt in procedure. Meal completion 90%. RD unable to obtain pt nutrition history at this time. Noted, no recent weight recorded. Recommend obtaining new weight to fully assess weight trends. RD to order nutritional supplements to aid in caloric and protein needs. Unable to complete Nutrition-Focused physical exam at this time.   Labs and medications reviewed.   Diet Order:   Diet Order            Diet heart healthy/carb modified Room service appropriate? Yes; Fluid consistency: Thin  Diet effective ____                 EDUCATION NEEDS:   Not appropriate for education at this time  Skin:  Skin Assessment: Reviewed RN Assessment  Last BM:  1/6  Height:   Ht Readings from Last 1 Encounters:  07/21/20 5\' 8"  (1.727 m)    Weight:   Wt Readings from Last 1 Encounters:  10/23/20 101.6 kg    BMI:  There is no height or weight on file to calculate BMI.  Estimated Nutritional Needs:   Kcal:  1900-2100  Protein:  100-110 grams  Fluid:  >/= 2 L/day  Corrin Parker, MS, RD, LDN RD pager number/after hours weekend pager  number on Amion.

## 2020-12-26 LAB — BASIC METABOLIC PANEL
Anion gap: 14 (ref 5–15)
BUN: 34 mg/dL — ABNORMAL HIGH (ref 6–20)
CO2: 24 mmol/L (ref 22–32)
Calcium: 9.4 mg/dL (ref 8.9–10.3)
Chloride: 102 mmol/L (ref 98–111)
Creatinine, Ser: 1.97 mg/dL — ABNORMAL HIGH (ref 0.44–1.00)
GFR, Estimated: 30 mL/min — ABNORMAL LOW (ref >60)
Glucose, Bld: 176 mg/dL — ABNORMAL HIGH (ref 70–99)
Potassium: 2.7 mmol/L — CL (ref 3.5–5.1)
Sodium: 140 mmol/L (ref 135–145)

## 2020-12-26 LAB — MAGNESIUM: Magnesium: 2.3 mg/dL (ref 1.7–2.4)

## 2020-12-26 LAB — GLUCOSE, CAPILLARY
Glucose-Capillary: 147 mg/dL — ABNORMAL HIGH (ref 70–99)
Glucose-Capillary: 374 mg/dL — ABNORMAL HIGH (ref 70–99)
Glucose-Capillary: 122 mg/dL — ABNORMAL HIGH (ref 70–99)
Glucose-Capillary: 145 mg/dL — ABNORMAL HIGH (ref 70–99)

## 2020-12-26 MED ORDER — CARVEDILOL 3.125 MG PO TABS
3.1250 mg | ORAL_TABLET | Freq: Two times a day (BID) | ORAL | Status: DC
  Administered 2020-12-26 – 2020-12-27 (×4): 3.125 mg via ORAL
  Filled 2020-12-26 (×4): qty 1

## 2020-12-26 MED ORDER — POTASSIUM CHLORIDE CRYS ER 20 MEQ PO TBCR
40.0000 meq | EXTENDED_RELEASE_TABLET | Freq: Two times a day (BID) | ORAL | Status: AC
  Administered 2020-12-26 (×2): 40 meq via ORAL
  Filled 2020-12-26 (×2): qty 2

## 2020-12-26 NOTE — Progress Notes (Signed)
Physical Therapy Treatment Patient Details Name: Adrienne Lane MRN: [id number] DOB: [birth year] Today's Date: 12/26/2020    History of Present Illness 55yo female c/o L sided weakness, facial droop with slurred speech, and gait difficulty. MRI shows R pontine infarct, small infarct in L frontal subcortical white matter, and small remote lacunar infarcts in the R corona radiata and L periatrial white matter. PMH A-fib with RVR, CHF, CKD, DM, obesity, HTN, cardiomyopathy    PT Comments    Patient received in bed, more fatigued this afternoon and needed slightly more physical assist for mobility today. Continued gait training and introduced stair training as well- had quite a difficult time with stair training and only able to perform 2 steps due to fatigue, and needed Max cues for safety/sequencing on steps. Also demonstrated reduced proprioception L LE during gait and transfers this afternoon. Left in bed with all needs met. After assessing stair performance/overall performance this afternoon, continue to recommend CIR moving forward.     Follow Up Recommendations  CIR;Supervision for mobility/OOB     Equipment Recommendations  Rolling walker with 5" wheels;3in1 (PT);Wheelchair (measurements PT);Wheelchair cushion (measurements PT)    Recommendations for Other Services       Precautions / Restrictions Precautions Precautions: Fall;Other (comment) Precaution Comments: L sided weakness Restrictions Weight Bearing Restrictions: No    Mobility  Bed Mobility Overal bed mobility: Needs Assistance Bed Mobility: Supine to Sit;Sit to Supine     Supine to sit: HOB elevated;Min assist Sit to supine: HOB elevated;Min assist   General bed mobility comments: MinA to manage L LE for getting in/out of bed, much more fatigued this afternoon  Transfers Overall transfer level: Needs assistance Equipment used: Rolling walker (2 wheeled) Transfers: Sit to/from Stand Sit to Stand: Min guard          General transfer comment: MinA to boost up to standing position and gain balance with RW as well as to place L UE on walker and get a decent grip  Ambulation/Gait Ambulation/Gait assistance: Min guard Gait Distance (Feet): 120 Feet Assistive device: Rolling walker (2 wheeled) Gait Pattern/deviations: Step-through pattern;Decreased step length - right;Decreased step length - left;Decreased stride length;Narrow base of support;Trunk flexed Gait velocity: decreased   General Gait Details: slow and steady with RW, no signficant balance deficits with BUE support, but stil wtih mild L sided weakness functionally   Stairs Stairs: Yes Stairs assistance: Min assist Stair Management: One rail Right;Step to pattern;Forwards Number of Stairs: 2 General stair comments: significant difficulty with steps- needed MinA for balance/safety and Max cues for proper sequencing and technique. Very fatigued after only 2 steps.   Wheelchair Mobility    Modified Rankin (Stroke Patients Only)       Balance Overall balance assessment: Needs assistance Sitting-balance support: Bilateral upper extremity supported;Feet supported Sitting balance-Leahy Scale: Good Sitting balance - Comments: statically   Standing balance support: No upper extremity supported;During functional activity Standing balance-Leahy Scale: Fair Standing balance comment: able to perform dynamic balance tasks/reaching with min guard/wide BOS and no UE support                            Cognition Arousal/Alertness: Awake/alert Behavior During Therapy: WFL for tasks assessed/performed Overall Cognitive Status: Within Functional Limits for tasks assessed  Exercises      General Comments        Pertinent Vitals/Pain Pain Assessment: No/denies pain    Home Living                      Prior Function            PT Goals (current goals can  now be found in the care plan section) Acute Rehab PT Goals Patient Stated Goal: go to rehab PT Goal Formulation: With patient Time For Goal Achievement: 01/08/21 Potential to Achieve Goals: Good Progress towards PT goals: Progressing toward goals    Frequency    Min 4X/week      PT Plan Current plan remains appropriate    Co-evaluation              AM-PAC PT "6 Clicks" Mobility   Outcome Measure  Help needed turning from your back to your side while in a flat bed without using bedrails?: A Little Help needed moving from lying on your back to sitting on the side of a flat bed without using bedrails?: A Little Help needed moving to and from a bed to a chair (including a wheelchair)?: A Little Help needed standing up from a chair using your arms (e.g., wheelchair or bedside chair)?: A Little Help needed to walk in hospital room?: A Little Help needed climbing 3-5 steps with a railing? : A Lot 6 Click Score: 17    End of Session Equipment Utilized During Treatment: Gait belt Activity Tolerance: Patient tolerated treatment well Patient left: in bed;with call bell/phone within reach Nurse Communication: Mobility status PT Visit Diagnosis: Unsteadiness on feet (R26.81);Difficulty in walking, not elsewhere classified (R26.2);Hemiplegia and hemiparesis;Muscle weakness (generalized) (M62.81);Other symptoms and signs involving the nervous system (R29.898);Other abnormalities of gait and mobility (R26.89) Hemiplegia - Right/Left: Left Hemiplegia - dominant/non-dominant: Non-dominant Hemiplegia - caused by: Cerebral infarction     Time: 3276-1470 PT Time Calculation (min) (ACUTE ONLY): 25 min  Charges:  $Gait Training: 8-22 mins $Therapeutic Activity: 8-22 mins                     Windell Norfolk, DPT, PN1   Supplemental Physical Therapist [place]    Pager [phone] Acute Rehab Office [phone]

## 2020-12-26 NOTE — Progress Notes (Signed)
PROGRESS NOTE  Adrienne Lane VCB:[id number] DOB: 20-Jan-1966 DOA: 12/24/2020 PCP: Nicolette Bang, DO  HPI/Recap of past 24 hours: Adrienne Lane is a 55 y.o. female with medical history significant of obesity; DM2; paroxysmal afib on Eliquis; stage IIIB CKD; nephrolithiasis; and chronic systolic CHF presenting with stroke-like symptoms.  She reports that she couldn't control her left side.  She noticed it in the middle of the night and then noticed it more the morning of her presentation.    Associated with blurry vision, left facial droop with drooling.  LKW unknown.  She presented outside of window for TPA, was not given.  Seen by neurology/stroke team.  Ongoing work-up for CVA.  MRI brain showed acute infarct in the right hemipons, additional small acute infarct in the left frontal subcortical white matter.  Mild associated edema in the right pons without substantial mass-effect.  Small remote lacunar infarct in the right corona radiata and left periatrial white matter.  She had no significant carotid artery stenosis.  2D echo showed reduced LVEF 45 to 50% the left ventricle demonstrates global hypokinesis.  No PFO.  No intracardiac source of thrombus.  Hemoglobin A1c and LDL not at goal.  Aspirin 81 mg was added to her home Eliquis.  PT recommended CIR.  12/26/20: Seen and examined at bedside this morning.  Reports she is making excellent exercising her left upper extremity.  Strength 3 out of 5.  Denies any headaches or dizziness this morning.  Patient has a plan to admit at [facility] on Saturday therefore will discharge in the morning.  Assessment/Plan: Principal Problem:   Acute CVA (cerebrovascular accident) [facility]) Active Problems:   Type 2 diabetes mellitus ([place])   Essential hypertension   Paroxysmal atrial fibrillation (HCC)   Chronic kidney disease (CKD), stage IV (severe) (HCC)   Non-ischemic cardiomyopathy (HCC)   Hyperlipidemia  Acute ischemic CVA involving right hemipons, left  frontal subcortical white matter Mild associated edema in the right pons without substantial mass-effect. Small remote lacunar infarct in the right corona radiata and left periorbital white matter. Patient states she was taking her Eliquis for paroxysmal A. fib as prescribed Aspirin 81 mg daily and Lipitor 80 mg daily added per neurosurgery/stroke team LDL 173, goal less than 70 Hemoglobin A1c 8.0, goal less than 7.0 She had no significant carotid artery stenosis.  2D echo showed reduced LVEF 45 to 50% the left ventricle demonstrates global hypokinesis.  No PFO.  No intracardiac source of thrombus. PT recommended CIR. CIR will see in consultation. Continue PT OT with assistance and fall precautions Permissive hypertension Long-term BP goal normotensive Restarted home Coreg at lower dose  Essential hypertension Ongoing permissive hypertension in the next 48 to 72 hours Hold off home Norvasc, p.o. hydralazine, Imdur, and torsemide Continue to closely monitor vital signs Restarted home Coreg at lower dose  Chronic systolic CHF 2D echo done on 12/25/2018 shows reduced LVEF 45 to 50% with left ventricle global hypokinesis Euvolemic on exam Diuretics on hold due to permissive hypertension Start strict I's and O's and daily weight  Hyperlipidemia Asthma stable  Type 2 diabetes with hyperglycemia Management as stated above.  Ambulatory dysfunction in the setting of acute stroke Management as stated above. Discharged to CIR on 12/27/2020.    Code Status:   Family Communication: Updated her daughter via phone on 12/25/2020.  Disposition Plan: Likely will DC to CIR on 12/27/2020  Consultants:  Neurology/stroke team  CIR  Procedures:  2D echo  Antimicrobials:  None.  DVT prophylaxis: Eliquis  Status is: Inpatient    Dispo:  Patient From: Home  Planned Disposition: Home with Health Care Svc  Expected discharge date: 12/27/20  Medically stable for discharge: Yes, pending  CIR placement.         Objective: Vitals:   12/26/20 0444 12/26/20 0814 12/26/20 1211 12/26/20 1643  BP: (!) 143/85 (!) 157/82 (!) 144/72 (!) 140/59  Pulse: 61 (!) 51 61 67  Resp: 18 18 18 17   Temp: 97.7 F (36.5 C) 98.4 F (36.9 C) 97.7 F (36.5 C) 97.6 F (36.4 C)  TempSrc: Oral Oral Oral Oral  SpO2: 97% 96% 96% 97%  Weight:      Height:       No intake or output data in the 24 hours ending 12/26/20 1654 Filed Weights   12/25/20 1400  Weight: 101.6 kg    Exam: No significant changes from prior exam.  . General: 55 y.o. year-old female well developed well nourished in no acute distress.  Alert and oriented x3. . Cardiovascular: Regular rate and rhythm with no rubs or gallops.  No thyromegaly or JVD noted.   Marland Kitchen Respiratory: Clear to auscultation with no wheezes or rales. Good inspiratory effort. . Abdomen: Soft nontender nondistended with normal bowel sounds x4 quadrants. . Musculoskeletal: No lower extremity edema bilaterally.  3/5 strength in left upper and left lower extremities.   . Skin: No ulcerative lesions noted or rashes, . Psychiatry: Mood is appropriate for condition and setting   Data Reviewed: CBC: Recent Labs  Lab 12/24/20 1034  WBC 5.4  NEUTROABS 4.0  HGB 13.0  HCT 39.8  MCV 86.9  PLT 725   Basic Metabolic Panel: Recent Labs  Lab 12/24/20 1034 12/26/20 0658  NA 139 140  K 3.3* 2.7*  CL 102 102  CO2 24 24  GLUCOSE 305* 176*  BUN 32* 34*  CREATININE 2.12* 1.97*  CALCIUM 9.4 9.4  MG  --  2.3   GFR: Estimated Creatinine Clearance: 41.7 mL/min (A) (by C-G formula based on SCr of 1.97 mg/dL (H)). Liver Function Tests: Recent Labs  Lab 12/24/20 1034  AST 25  ALT 18  ALKPHOS 95  BILITOT 0.6  PROT 7.2  ALBUMIN 3.1*   No results for input(s): LIPASE, AMYLASE in the last 168 hours. No results for input(s): AMMONIA in the last 168 hours. Coagulation Profile: Recent Labs  Lab 12/24/20 1034  INR 1.3*   Cardiac Enzymes: No  results for input(s): CKTOTAL, CKMB, CKMBINDEX, TROPONINI in the last 168 hours. BNP (last 3 results) No results for input(s): PROBNP in the last 8760 hours. HbA1C: Recent Labs    12/25/20 0202  HGBA1C 8.0*   CBG: Recent Labs  Lab 12/25/20 1611 12/25/20 2150 12/26/20 0617 12/26/20 1129 12/26/20 1613  GLUCAP 232* 115* 147* 374* 122*   Lipid Profile: Recent Labs    12/25/20 0202  CHOL 260*  HDL 59  LDLCALC 173*  TRIG 141  CHOLHDL 4.4   Thyroid Function Tests: Recent Labs    12/25/20 0202  TSH 1.712   Anemia Panel: No results for input(s): VITAMINB12, FOLATE, FERRITIN, TIBC, IRON, RETICCTPCT in the last 72 hours. Urine analysis:    Component Value Date/Time   COLORURINE STRAW (A) 05/23/2020 1400   APPEARANCEUR CLEAR 05/23/2020 1400   LABSPEC 1.006 05/23/2020 1400   PHURINE 7.0 05/23/2020 1400   GLUCOSEU NEGATIVE 05/23/2020 1400   HGBUR NEGATIVE 05/23/2020 1400   BILIRUBINUR NEGATIVE 05/23/2020 1400   KETONESUR NEGATIVE 05/23/2020 1400  PROTEINUR 100 (A) 05/23/2020 1400   NITRITE NEGATIVE 05/23/2020 1400   LEUKOCYTESUR TRACE (A) 05/23/2020 1400   Sepsis Labs: @LABRCNTIP (procalcitonin:4,lacticidven:4)  ) Recent Results (from the past 240 hour(s))  Resp Panel by RT-PCR (Flu A&B, Covid) Nasopharyngeal Swab     Status: None   Collection Time: 12/24/20  4:18 PM   Specimen: Nasopharyngeal Swab; Nasopharyngeal(NP) swabs in vial transport medium  Result Value Ref Range Status   SARS Coronavirus 2 by RT PCR NEGATIVE NEGATIVE Final    Comment: (NOTE) SARS-CoV-2 target nucleic acids are NOT DETECTED.  The SARS-CoV-2 RNA is generally detectable in upper respiratory specimens during the acute phase of infection. The lowest concentration of SARS-CoV-2 viral copies this assay can detect is 138 copies/mL. A negative result does not preclude SARS-Cov-2 infection and should not be used as the sole basis for treatment or other patient management decisions. A negative  result may occur with  improper specimen collection/handling, submission of specimen other than nasopharyngeal swab, presence of viral mutation(s) within the areas targeted by this assay, and inadequate number of viral copies(<138 copies/mL). A negative result must be combined with clinical observations, patient history, and epidemiological information. The expected result is Negative.  Fact Sheet for Patients:  EntrepreneurPulse.com.au  Fact Sheet for Healthcare Providers:  IncredibleEmployment.be  This test is no t yet approved or cleared by the [place] FDA and  has been authorized for detection and/or diagnosis of SARS-CoV-2 by FDA under an Emergency Use Authorization (EUA). This EUA will remain  in effect (meaning this test can be used) for the duration of the COVID-19 declaration under Section 564(b)(1) of the Act, 21 U.S.C.section 360bbb-3(b)(1), unless the authorization is terminated  or revoked sooner.       Influenza A by PCR NEGATIVE NEGATIVE Final   Influenza B by PCR NEGATIVE NEGATIVE Final    Comment: (NOTE) The Xpert Xpress SARS-CoV-2/FLU/RSV plus assay is intended as an aid in the diagnosis of influenza from Nasopharyngeal swab specimens and should not be used as a sole basis for treatment. Nasal washings and aspirates are unacceptable for Xpert Xpress SARS-CoV-2/FLU/RSV testing.  Fact Sheet for Patients: EntrepreneurPulse.com.au  Fact Sheet for Healthcare Providers: IncredibleEmployment.be  This test is not yet approved or cleared by the [place] FDA and has been authorized for detection and/or diagnosis of SARS-CoV-2 by FDA under an Emergency Use Authorization (EUA). This EUA will remain in effect (meaning this test can be used) for the duration of the COVID-19 declaration under Section 564(b)(1) of the Act, 21 U.S.C. section 360bbb-3(b)(1), unless the authorization is  terminated or revoked.  Performed at [facility], [place] [address], [place], [place] [address]       Studies: No results found.  Scheduled Meds: .  stroke: mapping our early stages of recovery book   Does not apply Once  . aspirin  81 mg Oral Daily  . atorvastatin  80 mg Oral QPM  . carvedilol  3.125 mg Oral BID WC  . dabigatran  150 mg Oral Q12H  . feeding supplement (GLUCERNA SHAKE)  237 mL Oral BID BM  . insulin aspart  0-15 Units Subcutaneous TID WC  . insulin glargine  20 Units Subcutaneous Q24H  . mexiletine  300 mg Oral Q12H  . potassium chloride  40 mEq Oral BID    Continuous Infusions:   LOS: 2 days     Kayleen Memos, MD [facility] Pager [phone]  If 7PM-7AM, please contact night-coverage [email or web address] Password [email or web address] 12/26/2020,  4:54 PM

## 2020-12-26 NOTE — Progress Notes (Signed)
Physical Therapy Treatment Patient Details Name: Adrienne Lane MRN: [id number] DOB: 1966-01-14 Today's Date: 12/26/2020    History of Present Illness 55yo female c/o L sided weakness, facial droop with slurred speech, and gait difficulty. MRI shows R pontine infarct, small infarct in L frontal subcortical white matter, and small remote lacunar infarcts in the R corona radiata and L periatrial white matter. PMH A-fib with RVR, CHF, CKD, DM, obesity, HTN, cardiomyopathy    PT Comments    Patient received in bed, very pleasant and has made significant progress since yesterday. Able to mobilize on a min guard basis with RW and shows significant improvement in functional balance skills today, and also tolerated a significant improvement in gait distance this morning. Still has a bit of functional weakness L LE but does well with RW dynamically. Left up in recliner with all needs met- pending performance on steps tomorrow, might be able to go home with HHPT or neuro OP PT instead of CIR at this point.     Follow Up Recommendations  CIR;Supervision for mobility/OOB     Equipment Recommendations  Rolling walker with 5" wheels;3in1 (PT);Wheelchair (measurements PT);Wheelchair cushion (measurements PT)    Recommendations for Other Services       Precautions / Restrictions Precautions Precautions: Fall;Other (comment) Precaution Comments: L sided weakness Restrictions Weight Bearing Restrictions: No    Mobility  Bed Mobility Overal bed mobility: Needs Assistance Bed Mobility: Supine to Sit     Supine to sit: HOB elevated;Modified independent (Device/Increase time)     General bed mobility comments: increased time and effort, HOB elevated and no physical assist given  Transfers Overall transfer level: Needs assistance Equipment used: Rolling walker (2 wheeled) Transfers: Sit to/from Stand Sit to Stand: Min guard         General transfer comment: min guard to boost up into  standing for safety from low bed, only needed S to boost to standing from elevated BSC, occasional cues for hand placement  Ambulation/Gait Ambulation/Gait assistance: Min guard Gait Distance (Feet): 160 Feet Assistive device: Rolling walker (2 wheeled) Gait Pattern/deviations: Step-through pattern;Decreased step length - right;Decreased step length - left;Decreased stride length;Narrow base of support;Trunk flexed Gait velocity: decreased   General Gait Details: slow and steady with RW, no signficant balance deficits with BUE support, but stil wtih mild L sided weakness functionally   Stairs             Wheelchair Mobility    Modified Rankin (Stroke Patients Only)       Balance Overall balance assessment: Needs assistance Sitting-balance support: Bilateral upper extremity supported;Feet supported Sitting balance-Leahy Scale: Good Sitting balance - Comments: statically   Standing balance support: No upper extremity supported;During functional activity Standing balance-Leahy Scale: Fair Standing balance comment: able to perform dynamic balance tasks/reaching with min guard/wide BOS and no UE support                            Cognition Arousal/Alertness: Awake/alert Behavior During Therapy: WFL for tasks assessed/performed Overall Cognitive Status: Within Functional Limits for tasks assessed                                        Exercises      General Comments        Pertinent Vitals/Pain Pain Assessment: No/denies pain    Home Living  Prior Function            PT Goals (current goals can now be found in the care plan section) Acute Rehab PT Goals Patient Stated Goal: go to rehab PT Goal Formulation: With patient Time For Goal Achievement: 01/08/21 Potential to Achieve Goals: Good Progress towards PT goals: Progressing toward goals    Frequency    Min 4X/week      PT Plan Current plan  remains appropriate    Co-evaluation              AM-PAC PT "6 Clicks" Mobility   Outcome Measure  Help needed turning from your back to your side while in a flat bed without using bedrails?: A Little Help needed moving from lying on your back to sitting on the side of a flat bed without using bedrails?: A Little Help needed moving to and from a bed to a chair (including a wheelchair)?: A Little Help needed standing up from a chair using your arms (e.g., wheelchair or bedside chair)?: A Little Help needed to walk in hospital room?: A Little Help needed climbing 3-5 steps with a railing? : A Lot 6 Click Score: 17    End of Session Equipment Utilized During Treatment: Gait belt Activity Tolerance: Patient tolerated treatment well Patient left: in chair;with call bell/phone within reach Nurse Communication: Mobility status PT Visit Diagnosis: Unsteadiness on feet (R26.81);Difficulty in walking, not elsewhere classified (R26.2);Hemiplegia and hemiparesis;Muscle weakness (generalized) (M62.81);Other symptoms and signs involving the nervous system (R29.898);Other abnormalities of gait and mobility (R26.89) Hemiplegia - Right/Left: Left Hemiplegia - dominant/non-dominant: Non-dominant Hemiplegia - caused by: Cerebral infarction     Time: 1308-6578 PT Time Calculation (min) (ACUTE ONLY): 14 min  Charges:  $Gait Training: 8-22 mins                     Windell Norfolk, DPT, PN1   Supplemental Physical Therapist [place]    Pager [phone] Acute Rehab Office [phone]

## 2020-12-26 NOTE — H&P (Signed)
Physical Medicine and Rehabilitation Admission H&P    Chief Complaint  Patient presents with  . Stroke Symptoms  : HPI: Adrienne Lane is a 55 year old right-handed female with history of atrial fibrillation RVR maintained on Eliquis, diastolic congestive heart failure, CKD stage III, diabetes mellitus, obesity with BMI 34.06, hypertension, cardiomyopathy.  Per chart review lives with spouse and also mother-in-law and son.  Mobile home 5 steps to entry.  Reportedly independent prior to admission.  Presented 12/24/2020 with left-sided weakness facial droop and slurred speech.  CT/MRI showed acute infarct right hemipons as well as additional small acute infarct left frontal subcortical white matter.  Small remote lacunar infarct in the right corona radiata and left periatrial white matter.  Patient did not receive TPA.  MRA of the head negative.  Echocardiogram with ejection fraction of 45 to 50% without thrombus.  Admission chemistries unremarkable except potassium 3.3 glucose 305 BUN 32 creatinine 2.12 urine drug screen negative.  Neurology follow-up patient's Eliquis was changed to Pradaxa for CVA prophylaxis as well as the addition of low-dose aspirin..  Maintain on a regular diet.  Due to patient's left-sided weakness facial droop slurred speech and decreased functional mobility she was admitted for a comprehensive rehab program.  Review of Systems  Constitutional: Negative for chills and fever.  HENT: Negative for hearing loss.   Eyes: Negative for blurred vision and double vision.  Respiratory: Negative for cough and shortness of breath.   Cardiovascular: Positive for palpitations and leg swelling.  Gastrointestinal: Positive for constipation. Negative for heartburn, nausea and vomiting.  Genitourinary: Negative for dysuria, flank pain and hematuria.  Musculoskeletal: Positive for joint pain and myalgias.  Skin: Negative for rash.  Neurological: Positive for speech change and weakness.   All other systems reviewed and are negative.  Past Medical History:  Diagnosis Date  . Atrial fibrillation with RVR ([place])   . [place] 01/2020  . CHF (congestive heart failure) ([facility]) 10/20/2019  . CKD (chronic kidney disease), stage IV ([facility])   . Diabetes mellitus without complication ([facility] [place])   . DOE (dyspnea on exertion)    walking upstairs or up hill resolves in one minute  . Fibroids   . History of kidney stones   . History of recent blood transfusion 02/26/2020  . Hypertension   . Iron deficiency anemia   . Non-ischemic cardiomyopathy ([facility])    tachycardia induced  . Obese   . Peripheral edema   . Premature ventricular contractions (PVCs) (VPCs)   . Umbilical hernia   . Wears glasses    Past Surgical History:  Procedure Laterality Date  . CHOLECYSTECTOMY    . CYSTOSCOPY W/ URETERAL STENT PLACEMENT Bilateral 05/24/2020   Procedure: CYSTOSCOPY WITH RETROGRADE PYELOGRAM/URETERAL STENT PLACEMENT;  Surgeon: Robley Fries, MD;  Location: [facility];  Service: Urology;  Laterality: Bilateral;  . CYSTOSCOPY/RETROGRADE/URETEROSCOPY Bilateral 04/03/2020   Procedure: CYSTOSCOPY/RETROGRADE/URETEROSCOPY;  Surgeon: Ardis Hughs, MD;  Location: [facility];  Service: Urology;  Laterality: Bilateral;  . HYSTERECTOMY ABDOMINAL WITH SALPINGO-OOPHORECTOMY  07/21/2020   Procedure: HYSTERECTOMY ABDOMINAL WITH BILATERAL SALPINGO-OOPHORECTOMY;  Surgeon: Sanjuana Kava, MD;  Location: [facility];  Service: Gynecology;;  . IR FLUORO GUIDE CV LINE RIGHT  02/21/2020  . IR US GUIDE VASC ACCESS RIGHT  02/21/2020  . NEPHROLITHOTOMY Left 02/14/2020   Procedure: NEPHROLITHOTOMY PERCUTANEOUS/ SURGEON ACCESS/ LEFT PERCUTANEOUS NEPHROSTOMY TUBE PLACEMENT;  Surgeon: Ardis Hughs, MD;  [facility];  Service: Urology;  Laterality: Left;  . NEPHROLITHOTOMY Left 02/21/2020   Procedure: NEPHROLITHOTOMY  PERCUTANEOUS SECOND LOOK;  Surgeon: Ardis Hughs, MD;  Location[facility];  Service: Urology;  Laterality: Left;  .  NEPHROLITHOTOMY Left 02/26/2020   Procedure: NEPHROLITHOTOMY PERCUTANEOUS;  Surgeon: Ceasar Mons, MD;  Location[facility];  Service: Urology;  Laterality: Left;  NEED 150 MIN  . NEPHROLITHOTOMY Right 03/24/2020   Procedure: NEPHROLITHOTOMY PERCUTANEOUS WITH ACCESS LEFT STENT REMOVAL;  Surgeon: Ardis Hughs, MD;  [facility];  Service: Urology;  Laterality: Right;  . RIGHT HEART CATH N/A 11/09/2019   Procedure: RIGHT HEART CATH;  Surgeon: Jolaine Artist, MD;  Location: [facility];  Service: Cardiovascular;  Laterality: N/A;  . WISDOM TOOTH EXTRACTION     Family History  Problem Relation Age of Onset  . Atrial fibrillation Mother   . Hypertension Mother   . Stroke Mother 11  . Diabetes Mother   . Diabetes Father   . Hypertension Father   . Diabetes Sister   . Hypertension Sister   . Diabetes Brother   . Hypertension Brother   . Diabetes Brother   . Heart attack Brother   . Stroke Maternal Grandmother 58   Social History:  reports that she has never smoked. She has never used smokeless tobacco. She reports that she does not drink alcohol and does not use drugs. Allergies:  Allergies  Allergen Reactions  . Adhesive [Tape]     Tears skin, can tolerate paper tape   Medications Prior to Admission  Medication Sig Dispense Refill  . amLODipine (NORVASC) 5 MG tablet Take 1 tablet (5 mg total) by mouth daily. 30 tablet 6  . carvedilol (COREG) 25 MG tablet Take 1 tablet (25 mg total) by mouth 2 (two) times daily with a meal. 180 tablet 3  . dapagliflozin propanediol (FARXIGA) 10 MG TABS tablet Take 1 tablet (10 mg total) by mouth daily before breakfast. 30 tablet 6  . ELIQUIS 5 MG TABS tablet TAKE 1 TABLET (5 MG TOTAL) BY MOUTH 2 (TWO) TIMES DAILY. 60 tablet 6  . hydrALAZINE (APRESOLINE) 100 MG tablet TAKE 1 TABLET (100 MG TOTAL) BY MOUTH EVERY 8 (EIGHT) HOURS. 90 tablet 6  . insulin glargine (LANTUS) 100 UNIT/ML injection Inject 0.2 mLs (20 Units total)  into the skin every morning. 10 mL 2  . isosorbide mononitrate (IMDUR) 60 MG 24 hr tablet TAKE 1 TABLET (60 MG TOTAL) BY MOUTH DAILY. 30 tablet 6  . mexiletine (MEXITIL) 150 MG capsule TAKE 2 CAPSULES (300 MG TOTAL) BY MOUTH EVERY 12 (TWELVE) HOURS. 120 capsule 6  . Multiple Vitamins-Minerals (WOMENS MULTI PO) Take 1 tablet by mouth daily.    . potassium chloride (KLOR-CON) 10 MEQ tablet Take 4 tablets (40 mEq total) by mouth daily. (Patient taking differently: Take 20 mEq by mouth 2 (two) times daily.) 120 tablet 6  . torsemide (DEMADEX) 20 MG tablet TAKE 2 TABLETS (40 MG TOTAL) BY MOUTH 2 (TWO) TIMES DAILY. 120 tablet 6  . glucose blood test strip Use as instructed 100 each 12  . Lancets (ONETOUCH ULTRASOFT) lancets Check CBG twice a day 60 each 5  . TRUEPLUS INSULIN SYRINGE 30G X 5/16" 0.5 ML MISC USE TO INJECT 12 UNITS AT BEDTIME. 100 each 6    Drug Regimen Review Drug regimen was reviewed and remains appropriate with no significant issues identified  Home: Home Living Family/patient expects to be discharged to:: Private residence Living Arrangements: Spouse/significant other,Other (Comment) (also mother in law, and son) Available Help at Discharge: Family,Available 24 hours/day  Type of Home: Mobile home Home Access: Stairs to enter Entrance Stairs-Number of Steps: 5 Entrance Stairs-Rails: Can reach both Home Layout: One level Bathroom Shower/Tub: Walk-in shower (two steps up to get into shower) Biochemist, clinical: Standard Home Equipment: None 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 History: Prior Function Level of Independence: Independent  Functional Status:  Mobility: Bed Mobility Overal bed mobility: Needs Assistance Bed Mobility: Supine to Sit,Sit to Supine Supine to sit: HOB elevated,Modified independent (Device/Increase time) Sit to supine: Modified independent (Device/Increase time) General bed mobility comments: increased time and effort,  pt hooked LLE to return BLE to bed Transfers Overall transfer level: Needs assistance Equipment used: Rolling walker (2 wheeled) Transfer via Lift Equipment: Stedy Transfers: Sit to/from [employer] to Stand: Min assist Stand pivot transfers: Total assist General transfer comment: minA with x2 attempts to progress into standing, pt utilizing rock to gain momentum to powerup into standing Ambulation/Gait General Gait Details: unable    ADL: ADL Overall ADL's : Needs assistance/impaired Eating/Feeding: Set up,Sitting Grooming: Set up,Sitting Upper Body Bathing: Minimal assistance,Sitting Lower Body Bathing: Minimal assistance,Sit to/from stand Upper Body Dressing : Minimal assistance,Sitting Lower Body Dressing: Minimal assistance,Sit to/from stand Toilet Transfer: Minimal Warden/ranger Details (indicate cue type and reason): short distances due to decrease endurance in LLE Toileting- Clothing Manipulation and Hygiene: Minimal assistance,Sit to/from stand Functional mobility during ADLs: Minimal assistance,Rolling walker General ADL Comments: pt with decreased activity tolerance, decreased endurance, Lsided weakness and decreased coordination  Cognition: Cognition Overall Cognitive Status: Within Functional Limits for tasks assessed Orientation Level: Oriented X4 Cognition Arousal/Alertness: Awake/alert Behavior During Therapy: WFL for tasks assessed/performed Overall Cognitive Status: Within Functional Limits for tasks assessed General Comments: pt scored 0/28 on Short Blessed Test screener to assess cognition, she demonstrated normal functioning with memory, problem solving, working memory  Physical Exam: Blood pressure (!) 143/85, pulse 61, temperature 97.7 F (36.5 C), temperature source Oral, resp. rate 18, height 5\' 8"  (1.727 m), weight 101.6 kg, SpO2 97 %. Physical Exam Constitutional:      Appearance: Normal appearance.   HENT:     Head: Normocephalic.     Right Ear: External ear normal.     Left Ear: External ear normal.     Nose: Nose normal.     Mouth/Throat:     Mouth: Mucous membranes are moist.     Pharynx: Oropharynx is clear.  Eyes:     Pupils: Pupils are equal, round, and reactive to light.  Cardiovascular:     Rate and Rhythm: Normal rate and regular rhythm.     Pulses: Normal pulses.     Heart sounds: No murmur heard. No gallop.   Pulmonary:     Effort: Pulmonary effort is normal. No respiratory distress.     Breath sounds: No wheezing or rales.  Abdominal:     General: Bowel sounds are normal. There is no distension.     Tenderness: There is no abdominal tenderness.  Musculoskeletal:        General: No swelling or deformity.     Cervical back: Normal range of motion.  Skin:    General: Skin is warm and dry.  Neurological:     Mental Status: She is alert.     Comments: Patient is alert in no acute distress makes eye contact with examiner.  Speech is a bit dysarthric but intelligible.  Oriented x3 with fair awareness of deficits. Reasonable insight. Left central 7. LUE 3/5  prox to distal. LLE 3+ to 4/5. Distal sensory loss in feet.   Psychiatric:        Mood and Affect: Mood normal.        Behavior: Behavior normal.     Results for orders placed or performed during the hospital encounter of 12/24/20 (from the past 48 hour(s))  CBG monitoring, ED     Status: Abnormal   Collection Time: 12/24/20 10:01 AM  Result Value Ref Range   Glucose-Capillary 293 (H) 70 - 99 mg/dL    Comment: Glucose reference range applies only to samples taken after fasting for at least 8 hours.  Protime-INR     Status: Abnormal   Collection Time: 12/24/20 10:34 AM  Result Value Ref Range   Prothrombin Time 15.4 (H) 11.4 - 15.2 seconds   INR 1.3 (H) 0.8 - 1.2    Comment: (NOTE) INR goal varies based on device and disease states. Performed at [facility], [place] [address] [place],  [place] [address]   APTT     Status: None   Collection Time: 12/24/20 10:34 AM  Result Value Ref Range   aPTT 36 24 - 36 seconds    Comment: Performed at [facility] [address] [place], [place] [address]  CBC     Status: None   Collection Time: 12/24/20 10:34 AM  Result Value Ref Range   WBC 5.4 4.0 - 10.5 K/uL   RBC 4.58 3.87 - 5.11 MIL/uL   Hemoglobin 13.0 12.0 - 15.0 g/dL   HCT 39.8 36.0 - 46.0 %   MCV 86.9 80.0 - 100.0 fL   MCH 28.4 26.0 - 34.0 pg   MCHC 32.7 30.0 - 36.0 g/dL   RDW 13.3 11.5 - 15.5 %   Platelets 208 150 - 400 K/uL   nRBC 0.0 0.0 - 0.2 %    Comment: Performed at [facility], [place] [address], [place], [place] [address]  Differential     Status: None   Collection Time: 12/24/20 10:34 AM  Result Value Ref Range   Neutrophils Relative % 74 %   Neutro Abs 4.0 1.7 - 7.7 K/uL   Lymphocytes Relative 16 %   Lymphs Abs 0.8 0.7 - 4.0 K/uL   Monocytes Relative 7 %   Monocytes Absolute 0.4 0.1 - 1.0 K/uL   Eosinophils Relative 3 %   Eosinophils Absolute 0.1 0.0 - 0.5 K/uL   Basophils Relative 0 %   Basophils Absolute 0.0 0.0 - 0.1 K/uL   Immature Granulocytes 0 %   Abs Immature Granulocytes 0.02 0.00 - 0.07 K/uL    Comment: Performed at [facility] [address] [place],  [address]  Comprehensive metabolic panel     Status: Abnormal   Collection Time: 12/24/20 10:34 AM  Result Value Ref Range   Sodium 139 135 - 145 mmol/L   Potassium 3.3 (L) 3.5 - 5.1 mmol/L   Chloride 102 98 - 111 mmol/L   CO2 24 22 - 32 mmol/L   Glucose, Bld 305 (H) 70 - 99 mg/dL    Comment: Glucose reference range applies only to samples taken after fasting for at least 8 hours.   BUN 32 (H) 6 - 20 mg/dL   Creatinine, Ser 2.12 (H) 0.44 - 1.00 mg/dL   Calcium 9.4 8.9 - 10.3 mg/dL   Total Protein 7.2 6.5 - 8.1 g/dL   Albumin 3.1 (L) 3.5 - 5.0 g/dL   AST 25 15 -  41 U/L   ALT 18 0 - 44 U/L   Alkaline Phosphatase 95 38 - 126 U/L   Total Bilirubin 0.6 0.3 - 1.2  mg/dL   GFR, Estimated 28 (L) >60 mL/min    Comment: (NOTE) Calculated using the CKD-EPI Creatinine Equation (2021)    Anion gap 13 5 - 15    Comment: Performed at [facility] [address], [place], [place] [address]  I-Stat beta hCG blood, ED     Status: Abnormal   Collection Time: 12/24/20 10:42 AM  Result Value Ref Range   I-stat hCG, quantitative 6.0 (H) <5 mIU/mL   Comment 3            Comment:   GEST. AGE      CONC.  (mIU/mL)   <=1 WEEK        5 - 50     2 WEEKS       50 - 500     3 WEEKS       100 - 10,000     4 WEEKS     1,000 - 30,000        FEMALE AND NON-PREGNANT FEMALE:     LESS THAN 5 mIU/mL   Resp Panel by RT-PCR (Flu A&B, Covid) Nasopharyngeal Swab     Status: None   Collection Time: 12/24/20  4:18 PM   Specimen: Nasopharyngeal Swab; Nasopharyngeal(NP) swabs in vial transport medium  Result Value Ref Range   SARS Coronavirus 2 by RT PCR NEGATIVE NEGATIVE    Comment: (NOTE) SARS-CoV-2 target nucleic acids are NOT DETECTED.  The SARS-CoV-2 RNA is generally detectable in upper respiratory specimens during the acute phase of infection. The lowest concentration of SARS-CoV-2 viral copies this assay can detect is 138 copies/mL. A negative result does not preclude SARS-Cov-2 infection and should not be used as the sole basis for treatment or other patient management decisions. A negative result may occur with  improper specimen collection/handling, submission of specimen other than nasopharyngeal swab, presence of viral mutation(s) within the areas targeted by this assay, and inadequate number of viral copies(<138 copies/mL). A negative result must be combined with clinical observations, patient history, and epidemiological information. The expected result is Negative.  Fact Sheet for Patients:  EntrepreneurPulse.com.au  Fact Sheet for Healthcare Providers:  IncredibleEmployment.be  This test is no t yet approved or  cleared by the [place] FDA and  has been authorized for detection and/or diagnosis of SARS-CoV-2 by FDA under an Emergency Use Authorization (EUA). This EUA will remain  in effect (meaning this test can be used) for the duration of the COVID-19 declaration under Section 564(b)(1) of the Act, 21 U.S.C.section 360bbb-3(b)(1), unless the authorization is terminated  or revoked sooner.       Influenza A by PCR NEGATIVE NEGATIVE   Influenza B by PCR NEGATIVE NEGATIVE    Comment: (NOTE) The Xpert Xpress SARS-CoV-2/FLU/RSV plus assay is intended as an aid in the diagnosis of influenza from Nasopharyngeal swab specimens and should not be used as a sole basis for treatment. Nasal washings and aspirates are unacceptable for Xpert Xpress SARS-CoV-2/FLU/RSV testing.  Fact Sheet for Patients: EntrepreneurPulse.com.au  Fact Sheet for Healthcare Providers: IncredibleEmployment.be  This test is not yet approved or cleared by the [place] FDA and has been authorized for detection and/or diagnosis of SARS-CoV-2 by FDA under an Emergency Use Authorization (EUA). This EUA will remain in effect (meaning this test can be used) for the duration of  the COVID-19 declaration under Section 564(b)(1) of the Act, 21 U.S.C. section 360bbb-3(b)(1), unless the authorization is terminated or revoked.  Performed at [facility], [place] [address] [place], [place] [address]   Urine rapid drug screen (hosp performed)not at [facility]     Status: None   Collection Time: 12/24/20  6:59 PM  Result Value Ref Range   Opiates NONE DETECTED NONE DETECTED   Cocaine NONE DETECTED NONE DETECTED   Benzodiazepines NONE DETECTED NONE DETECTED   Amphetamines NONE DETECTED NONE DETECTED   Tetrahydrocannabinol NONE DETECTED NONE DETECTED   Barbiturates NONE DETECTED NONE DETECTED    Comment: (NOTE) DRUG SCREEN FOR MEDICAL PURPOSES ONLY.  IF CONFIRMATION IS NEEDED FOR ANY  PURPOSE, NOTIFY LAB WITHIN 5 DAYS.  LOWEST DETECTABLE LIMITS FOR URINE DRUG SCREEN Drug Class                     Cutoff (ng/mL) Amphetamine and metabolites    1000 Barbiturate and metabolites    200 Benzodiazepine                 811 Tricyclics and metabolites     300 Opiates and metabolites        300 Cocaine and metabolites        300 THC                            50 Performed at [facility], [place] [address] [place], [place] [address]   Glucose, capillary     Status: Abnormal   Collection Time: 12/24/20  9:26 PM  Result Value Ref Range   Glucose-Capillary 240 (H) 70 - 99 mg/dL    Comment: Glucose reference range applies only to samples taken after fasting for at least 8 hours.  Hemoglobin A1c     Status: Abnormal   Collection Time: 12/25/20  2:02 AM  Result Value Ref Range   Hgb A1c MFr Bld 8.0 (H) 4.8 - 5.6 %    Comment: (NOTE) Pre diabetes:          5.7%-6.4%  Diabetes:              >6.4%  Glycemic control for   <7.0% adults with diabetes    Mean Plasma Glucose 182.9 mg/dL    Comment: Performed at [facility] [address] [place],  [address]  Lipid panel     Status: Abnormal   Collection Time: 12/25/20  2:02 AM  Result Value Ref Range   Cholesterol 260 (H) 0 - 200 mg/dL   Triglycerides 141 <150 mg/dL   HDL 59 >40 mg/dL   Total CHOL/HDL Ratio 4.4 RATIO   VLDL 28 0 - 40 mg/dL   LDL Cholesterol 173 (H) 0 - 99 mg/dL    Comment:        Total Cholesterol/HDL:CHD Risk Coronary Heart Disease Risk Table                     Men   Women  1/2 Average Risk   3.4   3.3  Average Risk       5.0   4.4  2 X Average Risk   9.6   7.1  3 X Average Risk  23.4   11.0        Use the calculated Patient Ratio above and the CHD Risk Table to determine the patient's CHD Risk.  ATP III CLASSIFICATION (LDL):  <100     mg/dL   Optimal  100-129  mg/dL   Near or Above                    Optimal  130-159  mg/dL   Borderline  160-189  mg/dL   High   >190     mg/dL   Very High Performed at [facility] [address] [place] [address], [place], [place] [address]   TSH     Status: None   Collection Time: 12/25/20  2:02 AM  Result Value Ref Range   TSH 1.712 0.350 - 4.500 uIU/mL    Comment: Performed by a 3rd Generation assay with a functional sensitivity of <=0.01 uIU/mL. Performed at [facility], [place] [address] [place], [place] [address]   Glucose, capillary     Status: Abnormal   Collection Time: 12/25/20  6:17 AM  Result Value Ref Range   Glucose-Capillary 148 (H) 70 - 99 mg/dL    Comment: Glucose reference range applies only to samples taken after fasting for at least 8 hours.  Glucose, capillary     Status: Abnormal   Collection Time: 12/25/20 12:11 PM  Result Value Ref Range   Glucose-Capillary 140 (H) 70 - 99 mg/dL    Comment: Glucose reference range applies only to samples taken after fasting for at least 8 hours.  Glucose, capillary     Status: Abnormal   Collection Time: 12/25/20  4:11 PM  Result Value Ref Range   Glucose-Capillary 232 (H) 70 - 99 mg/dL    Comment: Glucose reference range applies only to samples taken after fasting for at least 8 hours.  Glucose, capillary     Status: Abnormal   Collection Time: 12/25/20  9:50 PM  Result Value Ref Range   Glucose-Capillary 115 (H) 70 - 99 mg/dL    Comment: Glucose reference range applies only to samples taken after fasting for at least 8 hours.   CT HEAD WO CONTRAST  Result Date: 12/24/2020 CLINICAL DATA:  Neuro deficit, acute stroke suspected. Abnormal gait and aphasia stray. Today's having left-sided weakness. EXAM: CT HEAD WITHOUT CONTRAST TECHNIQUE: Contiguous axial images were obtained from the base of the skull through the vertex without intravenous contrast. COMPARISON:  None. FINDINGS: Brain: Small area of hypoattenuation in the left periatrial white matter (see series 5, image 44 and series 3, image 15). Additional small area of hypoattenuation in the right corona  radiata. No acute hemorrhage. No hydrocephalus. No mass lesion. No abnormal mass effect. No midline shift. No extra-axial fluid collection. Vascular: No hyperdense vessel identified. Calcific atherosclerosis. Skull: No acute fracture. Punctate foreign body in the subcutaneous high posterior left scalp without soft tissue swelling. Sinuses/Orbits: No acute finding. Other: No mastoid effusions. IMPRESSION: 1. Small area of hypoattenuation in the left periatrial white matter could represent chronic microvascular ischemic disease or age indeterminate lacunar infarct. Additional age indeterminate lacunar infarct within the right corona radiata, favored remote. If there is concern for acute infarct, MRI could further evaluate. 2. No acute hemorrhage. 3. Punctate foreign body in the subcutaneous high posterior left scalp, likely remote given no scalp swelling. Electronically Signed   By: Margaretha Sheffield MD   On: 12/24/2020 12:30   MR ANGIO HEAD WO CONTRAST  Result Date: 12/24/2020 CLINICAL DATA:  Follow-up examination for acute stroke. EXAM: MRA HEAD WITHOUT CONTRAST TECHNIQUE: Angiographic images of the Circle of Willis were obtained using MRA technique without intravenous  contrast. COMPARISON:  Prior MRI from earlier the same day. FINDINGS: ANTERIOR CIRCULATION: Visualized distal cervical segments of the internal carotid arteries are widely patent with symmetric antegrade flow. Petrous, cavernous, and supraclinoid segments widely patent without stenosis or other abnormality. A1 segments widely patent. Normal anterior communicating artery complex. Anterior cerebral arteries widely patent to their distal aspects. No M1 stenosis or occlusion. Normal MCA bifurcations. Distal MCA branches well perfused and symmetric. POSTERIOR CIRCULATION: Both V4 segments widely patent to the vertebrobasilar junction without stenosis. Left vertebral artery dominant. Both PICA origins patent and normal. Basilar widely patent to its  distal aspect. Superior cerebellar arteries patent bilaterally. Both PCAs supplied via the basilar as well as small bilateral posterior communicating arteries. PCAs well perfused to their distal aspects without stenosis. No intracranial aneurysm or other vascular abnormality. IMPRESSION: Normal intracranial MRA. No large vessel occlusion, hemodynamically significant stenosis, or other acute vascular abnormality. Electronically Signed   By: Jeannine Boga M.D.   On: 12/24/2020 22:40   MR BRAIN WO CONTRAST  Result Date: 12/24/2020 CLINICAL DATA:  Neuro deficit, acute stroke suspected. EXAM: MRI HEAD WITHOUT CONTRAST TECHNIQUE: Multiplanar, multiecho pulse sequences of the brain and surrounding structures were obtained without intravenous contrast. COMPARISON:  Same day head CT. FINDINGS: Brain: Acute infarct within the right hemi pons. Mild associated edema without substantial mass effect. Small acute infarct in the left frontal subcortical white matter (see series 2 and 250, image 29) without associated edema. Small remote lacunar infarcts in the right corona radiata and left periatrial white matter. No hydrocephalus. No acute hemorrhage. No mass lesion. No extra-axial fluid collection. Vascular: Major arterial flow voids are maintained at the skull base. Skull and upper cervical spine: Normal marrow signal. Sinuses/Orbits: Sinuses are clear.  Unremarkable orbits. IMPRESSION: 1. Acute infarct in the right hemipons. Additional small acute infarct in the left frontal subcortical white matter. 2. Mild associated edema in the right pons without substantial mass effect. 3. Small remote lacunar infarcts in the right corona radiata and left periatrial white matter. Findings discussed with Dr. Shelly Coss via telephone at 2:02 PM. Electronically Signed   By: Margaretha Sheffield MD   On: 12/24/2020 14:05   ECHOCARDIOGRAM COMPLETE  Result Date: 12/25/2020    ECHOCARDIOGRAM REPORT   Patient Name:   CARLISLE TORGESON Date of  Exam: 12/25/2020 Medical Rec #:  [id number]      Height:       68.0 in Accession #:    [id number]     Weight:       224.0 lb Date of Birth:  26-Dec-1965      BSA:          2.145 m Patient Age:    70 years       BP:           137/71 mmHg Patient Gender: F              HR:           81 bpm. Exam Location:  Inpatient Procedure: 2D Echo, Color Doppler and Cardiac Doppler Indications:    Stroke i163.96  History:        Patient has prior history of Echocardiogram examinations, most                 recent 06/16/2020. CHF, Arrythmias:Atrial Fibrillation; Risk                 Factors:Hypertension, Diabetes and Dyslipidemia.  Sonographer:    [place] Referring  Phys: [place] IMPRESSIONS  1. Since the last study on 06/16/20 LVEF remains mildly decreased with diffuse hypokinesis and LVEF 45-50%. RVEF is mildly decreased, RV is now less dilated and RVSP has improved from 50 to 30 mmHg.  2. Left ventricular ejection fraction, by estimation, is 45 to 50%. The left ventricle has mildly decreased function. The left ventricle demonstrates global hypokinesis. Left ventricular diastolic function could not be evaluated.  3. Right ventricular systolic function is mildly reduced. The right ventricular size is normal. There is normal pulmonary artery systolic pressure. The estimated right ventricular systolic pressure is 82.4 mmHg.  4. Left atrial size was mildly dilated.  5. The mitral valve is normal in structure. Mild mitral valve regurgitation. No evidence of mitral stenosis.  6. The aortic valve is normal in structure. Aortic valve regurgitation is not visualized. No aortic stenosis is present.  7. The inferior vena cava is normal in size with <50% respiratory variability, suggesting right atrial pressure of 8 mmHg. FINDINGS  Left Ventricle: Left ventricular ejection fraction, by estimation, is 45 to 50%. The left ventricle has mildly decreased function. The left ventricle demonstrates global hypokinesis. The left  ventricular internal cavity size was normal in size. There is  no left ventricular hypertrophy. Left ventricular diastolic function could not be evaluated due to atrial fibrillation. Left ventricular diastolic function could not be evaluated. Right Ventricle: The right ventricular size is normal. No increase in right ventricular wall thickness. Right ventricular systolic function is mildly reduced. There is normal pulmonary artery systolic pressure. The tricuspid regurgitant velocity is 2.35 m/s, and with an assumed right atrial pressure of 8 mmHg, the estimated right ventricular systolic pressure is 23.5 mmHg. Left Atrium: Left atrial size was mildly dilated. Right Atrium: Right atrial size was normal in size. Pericardium: There is no evidence of pericardial effusion. Mitral Valve: The mitral valve is normal in structure. Mild mitral valve regurgitation. No evidence of mitral valve stenosis. Tricuspid Valve: The tricuspid valve is normal in structure. Tricuspid valve regurgitation is mild . No evidence of tricuspid stenosis. Aortic Valve: The aortic valve is normal in structure. Aortic valve regurgitation is not visualized. No aortic stenosis is present. Pulmonic Valve: The pulmonic valve was normal in structure. Pulmonic valve regurgitation is not visualized. No evidence of pulmonic stenosis. Aorta: The aortic root is normal in size and structure. Venous: The inferior vena cava is normal in size with less than 50% respiratory variability, suggesting right atrial pressure of 8 mmHg. IAS/Shunts: No atrial level shunt detected by color flow Doppler.  LEFT VENTRICLE PLAX 2D LVIDd:         4.80 cm LVIDs:         4.20 cm LV PW:         1.20 cm LV IVS:        0.70 cm LVOT diam:     2.30 cm LV SV:         54 LV SV Index:   25 LVOT Area:     4.15 cm  LV Volumes (MOD) LV vol d, MOD A2C: 84.7 ml LV vol d, MOD A4C: 99.4 ml LV vol s, MOD A2C: 60.2 ml LV vol s, MOD A4C: 65.7 ml LV SV MOD A2C:     24.5 ml LV SV MOD A4C:      99.4 ml LV SV MOD BP:      31.4 ml RIGHT VENTRICLE RV S prime:     7.51 cm/s TAPSE (M-mode): 2.3 cm  LEFT ATRIUM             Index       RIGHT ATRIUM           Index LA diam:        3.60 cm 1.68 cm/m  RA Area:     15.70 cm LA Vol (A2C):   83.1 ml 38.75 ml/m RA Volume:   41.60 ml  19.40 ml/m LA Vol (A4C):   62.1 ml 28.96 ml/m LA Biplane Vol: 71.2 ml 33.20 ml/m  AORTIC VALVE LVOT Vmax:   66.25 cm/s LVOT Vmean:  43.275 cm/s LVOT VTI:    0.129 m  AORTA Ao Root diam: 2.80 cm TRICUSPID VALVE TR Peak grad:   22.1 mmHg TR Vmax:        235.00 cm/s  SHUNTS Systemic VTI:  0.13 m Systemic Diam: 2.30 cm Ena Dawley MD Electronically signed by Ena Dawley MD Signature Date/Time: 12/25/2020/11:53:23 AM    Final    VAS US CAROTID  Result Date: 12/25/2020 Carotid Arterial Duplex Study Indications:   CVA and Left side weakness, left facial drrop, speech                disturbance. Risk Factors:  Hypertension, Diabetes, no history of smoking. Other Factors: CKD4, CHF, AFIB, Obesity. Performing Technologist: Rogelia Rohrer  Examination Guidelines: A complete evaluation includes B-mode imaging, spectral Doppler, color Doppler, and power Doppler as needed of all accessible portions of each vessel. Bilateral testing is considered an integral part of a complete examination. Limited examinations for reoccurring indications may be performed as noted.  Right Carotid Findings: +----------+--------+--------+--------+------------------+--------+           PSV cm/sEDV cm/sStenosisPlaque DescriptionComments +----------+--------+--------+--------+------------------+--------+ CCA Prox  97      11                                         +----------+--------+--------+--------+------------------+--------+ CCA Distal78      28                                         +----------+--------+--------+--------+------------------+--------+ ICA Prox  65      16      1-39%                               +----------+--------+--------+--------+------------------+--------+ ICA Distal109     25                                         +----------+--------+--------+--------+------------------+--------+ ECA       89      0                                          +----------+--------+--------+--------+------------------+--------+ +----------+--------+-------+----------------+-------------------+           PSV cm/sEDV cmsDescribe        Arm Pressure (mmHG) +----------+--------+-------+----------------+-------------------+ [id number]     0      Multiphasic, WNL                    +----------+--------+-------+----------------+-------------------+ +---------+--------+--+--------+--+---------+  VertebralPSV cm/s61EDV cm/s13Antegrade +---------+--------+--+--------+--+---------+  Left Carotid Findings: +----------+--------+--------+--------+------------------+--------+           PSV cm/sEDV cm/sStenosisPlaque DescriptionComments +----------+--------+--------+--------+------------------+--------+ CCA Prox  107     15                                         +----------+--------+--------+--------+------------------+--------+ CCA Distal84      18                                         +----------+--------+--------+--------+------------------+--------+ ICA Prox  89      25      1-39%                              +----------+--------+--------+--------+------------------+--------+ ICA Distal93      28                                         +----------+--------+--------+--------+------------------+--------+ ECA       64      0                                          +----------+--------+--------+--------+------------------+--------+ +----------+--------+--------+----------------+-------------------+           PSV cm/sEDV cm/sDescribe        Arm Pressure (mmHG) +----------+--------+--------+----------------+-------------------+ [id number]      11       Multiphasic, WNL                    +----------+--------+--------+----------------+-------------------+ +---------+--------+--+--------+--+---------+ VertebralPSV cm/s63EDV cm/s19Antegrade +---------+--------+--+--------+--+---------+   Summary: Right Carotid: Velocities in the right ICA are consistent with a 1-39% stenosis.                The extracranial vessels were near-normal with only minimal wall                thickening or plaque. Left Carotid: Velocities in the left ICA are consistent with a 1-39% stenosis.               The extracranial vessels were near-normal with only minimal wall               thickening or plaque. Vertebrals:  Bilateral vertebral arteries demonstrate antegrade flow. Subclavians: Normal flow hemodynamics were seen in bilateral subclavian              arteries. *See table(s) above for measurements and observations.     Preliminary        Medical Problem List and Plan: 1.  Left side weakness with facial droop/slurred speech secondary to right pontine lacunar infarction  -patient may shower  -ELOS/Goals: 9-12 days, supervision PT, OT 2.  Antithrombotics: -DVT/anticoagulation: Pradaxa  -antiplatelet therapy: Aspirin 81 mg daily 3. Pain Management: Tylenol as needed 4. Mood: Provide emotional support  -antipsychotic agents: N/A 5. Neuropsych: This patient is capable of making decisions on her own behalf. 6. Skin/Wound Care: Routine skin checks 7. Fluids/Electrolytes/Nutrition: Routine in and outs with follow-up chemistries 8.  Atrial fibrillation.  Cardiac rate controlled and regular.  Continue Pradaxa as  well as Mexitil 300 mg every 12 hours 9.  Hypertension.  Coreg 3.125 mg twice daily.  Monitor with increased activity. 10.  Diastolic congestive heart failure.  Monitor for any signs of fluid overload 11.  CKD stage III.  Baseline creatinine 1.55-2.38.  Follow-up chemistries Monday 12.  Diabetes mellitus with peripheral neuropathy.  Hemoglobin A1c 8.0.   Lantus insulin 20 units daily.  Check blood sugars before meals and at bedtime.  Diabetic teaching 13.  Hyperlipidemia.  Lipitor 14.  Obesity.  BMI 34.06.  Dietary follow-up     Cathlyn Parsons, PA-C 12/26/2020

## 2020-12-26 NOTE — Progress Notes (Signed)
Inpatient Rehab Admissions:  Inpatient Rehab Consult received.  I spoke with patient over the phone for rehabilitation assessment and to discuss goals and expectations of an inpatient rehab admission.  She is open to CIR and reports that she is home with her spouse, son, and mother in law and will have 24/7 supervision at home.  We reviewed estimated LOS to be about 2 weeks, with goals of supervision, and we reviewed cost of rehab and medicaid screening process.  Appears pt has been screened for medicaid and they are following.  I asked if she would like for me to contact someone in her family to discuss CIR and she reports that she can pass it all along.  I should have a bed for pt to admit tomorrow so will confirm with MD that she is ready.    Signed: Shann Medal, PT, DPT Admissions Coordinator [phone] 12/26/20  1:25 PM

## 2020-12-26 NOTE — Progress Notes (Signed)
Inpatient Rehab Admissions Coordinator:   I have a bed for this pt to admit to CIR on Saturday (12/27/20).  Dr. [place] Crane in agreement.  Rehab MD (Dr. Naaman Plummer) to assess pt and confirm admission on Saturday.  Floor RN can call CIR at [phone] for report after 12pm on Saturday.  I have let pt/family and case manager know.    Shann Medal, PT, DPT Admissions Coordinator [phone] 12/26/20  1:45 PM

## 2020-12-26 NOTE — Evaluation (Signed)
Speech Language Pathology Evaluation Patient Details Name: Chizuko Trine MRN: [id number] DOB: 1966-08-17 Today's Date: 12/26/2020 Time: 6226-3335 SLP Time Calculation (min) (ACUTE ONLY): 28 min  Problem List:  Patient Active Problem List   Diagnosis Date Noted  . Acute CVA (cerebrovascular accident) ([place]) 12/24/2020  . Hyperlipidemia 10/24/2020  . Vitamin D deficiency 10/24/2020  . Fibroids 07/21/2020  . S/P TAH (total abdominal hysterectomy) 07/21/2020  . Chronic a-fib ([place]) 05/23/2020  . Hydroureter on right 05/23/2020  . Microalbuminuria 05/13/2020  . Non-ischemic cardiomyopathy ([place])   . Normocytic anemia   . Nephrolithiasis 02/14/2020  . Paroxysmal atrial fibrillation ([place]) 12/18/2019  . 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 Gross hematuria 12/18/2019  . Staghorn renal calculus 12/18/2019  . Chronic kidney disease (CKD), stage IV (severe) ([place]) 12/18/2019  . Umbilical hernia without obstruction and without gangrene 12/18/2019  . Uterine fibroid 10/25/2019  . Right hip pain   . Type 2 diabetes mellitus ([place])   . Essential hypertension   . CHF (congestive heart failure) ([place]) 10/21/2019  . Atrial fibrillation with RVR ([place]) 10/21/2019  . Atrial fibrillation with rapid ventricular response ([place]) 10/20/2019   Past Medical History:  Past Medical History:  Diagnosis Date  . Atrial fibrillation with RVR ([place])   . [place] 01/2020  . CHF (congestive heart failure) ([place]) 10/20/2019  . CKD (chronic kidney disease), stage IV ([place])   . Diabetes mellitus without complication ([place])   . DOE (dyspnea on exertion)    walking upstairs or up hill resolves in one minute  . Fibroids   . History of kidney stones   . History of recent blood transfusion 02/26/2020  . Hypertension   . Iron deficiency anemia   . Non-ischemic cardiomyopathy ([facility])    tachycardia induced  . Obese   . Peripheral edema   . Premature ventricular contractions (PVCs) ([facility])   . Umbilical hernia   . Wears glasses     Past Surgical History:  Past Surgical History:  Procedure Laterality Date  . CHOLECYSTECTOMY    . CYSTOSCOPY W/ URETERAL STENT PLACEMENT Bilateral 05/24/2020   Procedure: CYSTOSCOPY WITH RETROGRADE PYELOGRAM/URETERAL STENT PLACEMENT;  Surgeon: Robley Fries, MD;  Location: [facility];  Service: Urology;  Laterality: Bilateral;  . CYSTOSCOPY/RETROGRADE/URETEROSCOPY Bilateral 04/03/2020   Procedure: CYSTOSCOPY/RETROGRADE/URETEROSCOPY;  Surgeon: Ardis Hughs, MD;  Location: [facility];  Service: Urology;  Laterality: Bilateral;  . HYSTERECTOMY ABDOMINAL WITH SALPINGO-OOPHORECTOMY  07/21/2020   Procedure: HYSTERECTOMY ABDOMINAL WITH BILATERAL SALPINGO-OOPHORECTOMY;  Surgeon: Sanjuana Kava, MD;  Location: [facility];  Service: Gynecology;;  . IR FLUORO GUIDE CV LINE RIGHT  02/21/2020  . IR US GUIDE VASC ACCESS RIGHT  02/21/2020  . NEPHROLITHOTOMY Left 02/14/2020   Procedure: NEPHROLITHOTOMY PERCUTANEOUS/ SURGEON ACCESS/ LEFT PERCUTANEOUS NEPHROSTOMY TUBE PLACEMENT;  Surgeon: Ardis Hughs, MD;  Location: [facility];  Service: Urology;  Laterality: Left;  . NEPHROLITHOTOMY Left 02/21/2020   Procedure: NEPHROLITHOTOMY PERCUTANEOUS SECOND LOOK;  Surgeon: Ardis Hughs, MD;  Location: [facility];  Service: Urology;  Laterality: Left;  . NEPHROLITHOTOMY Left 02/26/2020   Procedure: NEPHROLITHOTOMY PERCUTANEOUS;  Surgeon: Ceasar Mons, MD;  Location: [facility];  Service: Urology;  Laterality: Left;  NEED 150 MIN  . NEPHROLITHOTOMY Right 03/24/2020   Procedure: NEPHROLITHOTOMY PERCUTANEOUS WITH ACCESS LEFT STENT REMOVAL;  Surgeon: Ardis Hughs, MD;  Location: [facility];  Service: Urology;  Laterality: Right;  . RIGHT HEART CATH N/A 11/09/2019   Procedure: RIGHT HEART CATH;  Surgeon: Jolaine Artist, MD;  Location: [facility];  Service: Cardiovascular;  Laterality: N/A;  . WISDOM TOOTH EXTRACTION     HPI:  55 y.o. female with medical history significant of obesity; DM; afib; stage IV CKD;  nephrolithiasis; and chronic systolic CHF presenting with stroke symptoms.  She reports that she couldn't control her left side.  She noticed it in the middle of the night and then noticed it more this AM.  Her left leg got weak this AM.  +L facial droop.  +drooling, no trouble swallowing.  Her speech sounds a bit slurred and slower. 12/26/19 MRI brain revealed Acute infarct within the right hemi pons. Mild associatededema without substantial mass effect. Small acute infarct in theleft frontal subcortical white matter (see series 2 and 250, image29) without associated edema. Small remote lacunar infarcts in the right corona radiata and left periatrial white matter. Nohydrocephalus. No acute hemorrhage. No mass lesion. No extra-axial fluid collection.  Assessment / Plan / Recommendation Clinical Impression  Pt administered the SLUMS assessment ([place] Mental Status Examination) which revealed a typical score of 28/30 with 27/30 being considered a normal score for this assessment.  Pt recalled f2/5 objects with a categorization cue, but all other tasks were performed without difficulty. Speech was noted to be slightly dysarthric with lateral lisping with certain sibilants noted; however, speech was judged to be 75-100% intelligible within complex conversation.  Pt aware of current deficits and became teary when discussing former independence within home.  Pt also mentioned some sensory changes with eating, but otherwise, swallowing is efficient and safe during PO consumption per pt report.  BSE was not generated because [facility] swallow screen completed and passed.  It is recommended that pt be f/u in CIR once transferred for short-term ST for dysarthria.  Thank you for this consult.     SLP Assessment  SLP Recommendation/Assessment: All further Speech Lanaguage Pathology  needs can be addressed in the next venue of care SLP Visit Diagnosis: Dysarthria and anarthria (R47.1)    Follow Up  Recommendations  Inpatient Rehab    Frequency and Duration           SLP Evaluation Cognition  Overall Cognitive Status: Within Functional Limits for tasks assessed Arousal/Alertness: Awake/alert Orientation Level: Oriented X4 Attention: Sustained Sustained Attention: Appears intact Memory: Impaired Memory Impairment: Decreased recall of new information;Retrieval deficit Immediate Memory Recall: Sock;Blue;Bed Memory Recall Sock: Without Cue Memory Recall Blue: Without Cue Memory Recall Bed: With Cue Awareness: Appears intact Problem Solving: Appears intact Safety/Judgment: Appears intact       Comprehension  Auditory Comprehension Overall Auditory Comprehension: Appears within functional limits for tasks assessed Yes/No Questions: Within Functional Limits Commands: Within Functional Limits Conversation: Complex Visual Recognition/Discrimination Discrimination: Within Function Limits Reading Comprehension Reading Status: Within funtional limits    Expression Expression Primary Mode of Expression: Verbal Verbal Expression Overall Verbal Expression: Appears within functional limits for tasks assessed Initiation: No impairment Level of Generative/Spontaneous Verbalization: Conversation Repetition: No impairment Naming: No impairment Pragmatics: No impairment Non-Verbal Means of Communication: Not applicable Written Expression Dominant Hand: Right Written Expression: Within Functional Limits   Oral / Motor  Oral Motor/Sensory Function Overall Oral Motor/Sensory Function: Mild impairment Facial ROM: Reduced left Facial Symmetry: Abnormal symmetry left Facial Strength: Within Functional Limits Facial Sensation: Reduced left Lingual ROM: Within Functional Limits Lingual Strength: Within Functional Limits Lingual Sensation: Reduced Motor Speech Overall Motor Speech: Appears within functional limits for tasks assessed Respiration: Within functional  limits Phonation: Normal Resonance: Within functional limits Articulation: Impaired Level of  Impairment: Sentence Intelligibility: Intelligibility reduced Word: 75-100% accurate Phrase: 75-100% accurate Sentence: 75-100% accurate Conversation: 75-100% accurate Motor Planning: Witnin functional limits Motor Speech Errors: Not applicable                       Elvina Sidle, M.S., CCC-SLP 12/26/2020, 4:03 PM

## 2020-12-26 NOTE — PMR Pre-admission (Signed)
PMR Admission Coordinator Pre-Admission Assessment  Patient: Adrienne Lane is an 55 y.o., female MRN: [id number] DOB: [birth year] Height: 5\' 8"  (172.7 cm) (from 07/21/2020) Weight: 101.6 kg (from 10/23/2020)              Insurance Information HMO:     PPO:      PCP:      IPA:      80/20:      OTHER:  PRIMARY: Uninsured      Policy#:       Subscriber:  CM Name:       Phone#:      Fax#:  Pre-Cert#:       Employer:  Benefits:  Phone #:      Name:  Eff. Date:     Deduct:       Out of Pocket Max:       Life Max:   CIR:       SNF:  Outpatient:      Co-Pay:  Home Health:       Co-Pay:  DME:      Co-Pay:  Providers:  SECONDARY:       Policy#:       Phone#:   Development worker, community:       Phone#:   The Engineer, petroleum" for patients in Inpatient Rehabilitation Facilities with attached "Privacy Act [place] Records" was provided and verbally reviewed with: N/A  Emergency Contact Information Contact Information    Name Relation Home Work [place], [place] Spouse [phone]  [phone]   [name], [name] Daughter   [phone]     Current Medical History  Patient Admitting Diagnosis: CVA History of Present Illness: Adrienne Lane is a 55 year old right-handed female with history of atrial fibrillation RVR maintained on Eliquis, diastolic congestive heart failure, CKD stage III, diabetes mellitus, obesity with BMI 34.06, hypertension, cardiomyopathy.  Presented 12/24/2020 with left-sided weakness facial droop and slurred speech.  CT/MRI showed acute infarct right hemipons as well as additional small acute infarct left frontal subcortical white matter.  Small remote lacunar infarct in the right corona radiata and left periatrial white matter.  Patient did not receive TPA.  MRA of the head negative.  Echocardiogram with ejection fraction of 45 to 50% without thrombus.  Admission chemistries unremarkable except potassium 3.3 glucose 305 BUN 32 creatinine 2.12  urine drug screen negative.  Neurology follow-up patient's Eliquis was changed to Pradaxa for CVA prophylaxis as well as the addition of low-dose aspirin..  Maintain on a regular diet.  Due to patient's left-sided weakness facial droop slurred speech and decreased functional mobility she was recommended for a comprehensive rehab program.  Complete NIHSS TOTAL: 5 Glasgow Coma Scale Score: 15  Past Medical History  Past Medical History:  Diagnosis Date  . Atrial fibrillation with RVR ([place])   . [place] 01/2020  . CHF (congestive heart failure) ([place]) 10/20/2019  . CKD (chronic kidney disease), stage IV ([place])   . Diabetes mellitus without complication ([place])   . DOE (dyspnea on exertion)    walking upstairs or up hill resolves in one minute  . Fibroids   . History of kidney stones   . History of recent blood transfusion 02/26/2020  . Hypertension   . Iron deficiency anemia   . Non-ischemic cardiomyopathy ([facility])    tachycardia induced  . Obese   . Peripheral edema   . Premature ventricular contractions (PVCs) ([facility])   . Umbilical hernia   . Wears glasses  Family History  family history includes Atrial fibrillation in her mother; Diabetes in her brother, brother, father, mother, and sister; Heart attack in her brother; Hypertension in her brother, father, mother, and sister; Stroke (age of onset: 32) in her maternal grandmother; Stroke (age of onset: 22) in her mother.  Prior Rehab/Hospitalizations:  Has the patient had prior rehab or hospitalizations prior to admission? Yes  Has the patient had major surgery during 100 days prior to admission? No  Current Medications   Current Facility-Administered Medications:  .   stroke: mapping our early stages of recovery book, , Does not apply, Once, Karmen Bongo, MD .  acetaminophen (TYLENOL) tablet 650 mg, 650 mg, Oral, Q4H PRN **OR** acetaminophen (TYLENOL) 160 MG/5ML solution 650 mg, 650 mg, Per Tube, Q4H PRN **OR** acetaminophen  (TYLENOL) suppository 650 mg, 650 mg, Rectal, Q4H PRN, Karmen Bongo, MD .  aspirin chewable tablet 81 mg, 81 mg, Oral, Daily, Karmen Bongo, MD, 81 mg at 12/26/20 9242 .  atorvastatin (LIPITOR) tablet 80 mg, 80 mg, Oral, QPM, Karmen Bongo, MD, 80 mg at 12/25/20 1709 .  carvedilol (COREG) tablet 3.125 mg, 3.125 mg, Oral, BID WC, Hall, Carole N, DO, 3.125 mg at 12/26/20 6834 .  dabigatran (PRADAXA) capsule 150 mg, 150 mg, Oral, Q12H, Wendee Beavers, RPH, 150 mg at 12/26/20 1962 .  feeding supplement (GLUCERNA SHAKE) (GLUCERNA SHAKE) liquid 237 mL, 237 mL, Oral, BID BM, Hall, Carole N, DO, 237 mL at 12/26/20 0821 .  insulin aspart (novoLOG) injection 0-15 Units, 0-15 Units, Subcutaneous, TID WC, Karmen Bongo, MD, 15 Units at 12/26/20 1139 .  insulin glargine (LANTUS) injection 20 Units, 20 Units, Subcutaneous, Q24H, Karmen Bongo, MD, 20 Units at 12/26/20 0636 .  mexiletine (MEXITIL) capsule 300 mg, 300 mg, Oral, Q12H, Karmen Bongo, MD, 300 mg at 12/26/20 2297 .  potassium chloride SA (KLOR-CON) CR tablet 40 mEq, 40 mEq, Oral, BID, Irene Pap N, DO, 40 mEq at 12/26/20 1139 .  senna-docusate (Senokot-S) tablet 1 tablet, 1 tablet, Oral, QHS PRN, Karmen Bongo, MD  Patients Current Diet:  Diet Order            Diet heart healthy/carb modified Room service appropriate? Yes; Fluid consistency: Thin  Diet effective ____                 Precautions / Restrictions Precautions Precautions: Fall,Other (comment) Precaution Comments: L sided weakness Restrictions Weight Bearing Restrictions: No   Has the patient had 2 or more falls or a fall with injury in the past year?Yes  Prior Activity Level Community (5-7x/wk): states she got out of house daily, even just to talk, no DME used at baseline, still driving  Prior Functional Level Prior Function Level of Independence: Independent  Self Care: Did the patient need help bathing, dressing, using the toilet or eating?   Independent  Indoor Mobility: Did the patient need assistance with walking from room to room (with or without device)? Independent  Stairs: Did the patient need assistance with internal or external stairs (with or without device)? Independent  Functional Cognition: Did the patient need help planning regular tasks such as shopping or remembering to take medications? Independent  Home Assistive Devices / Equipment Home Assistive Devices/Equipment: None Home Equipment: None  Prior Device Use: Indicate devices/aids used by the patient prior to current illness, exacerbation or injury? None of the above  Current Functional Level Cognition  Overall Cognitive Status: Within Functional Limits for tasks assessed Orientation Level: Oriented X4 General Comments: pt  scored 0/28 on Short Blessed Test screener to assess cognition, she demonstrated normal functioning with memory, problem solving, working memory    Extremity Assessment (includes Sensation/Coordination)  Upper Extremity Assessment: LUE deficits/detail LUE Deficits / Details: overall 2/5; pt with poor functional use of LUE;sensation in tact, decreased fine motor skills and limited rapid alternating movements, decreased gross motor coordination;pt demonstrates limitations with motor planning LUE Sensation: WNL LUE Coordination: decreased fine motor,decreased gross motor  Lower Extremity Assessment: RLE deficits/detail,LLE deficits/detail RLE Deficits / Details: WNL RLE Sensation: WNL RLE Coordination: WNL LLE Deficits / Details: ankle dorsiflexion 4/5, quad 4/5, hip flexor 2/5 LLE Sensation: decreased proprioception LLE Coordination: decreased gross motor    ADLs  Overall ADL's : Needs assistance/impaired Eating/Feeding: Set up,Sitting Grooming: Set up,Sitting Upper Body Bathing: Minimal assistance,Sitting Lower Body Bathing: Minimal assistance,Sit to/from stand Upper Body Dressing : Minimal assistance,Sitting Lower Body  Dressing: Minimal assistance,Sit to/from stand Toilet Transfer: Minimal Warden/ranger Details (indicate cue type and reason): short distances due to decrease endurance in LLE Toileting- Clothing Manipulation and Hygiene: Minimal assistance,Sit to/from stand Functional mobility during ADLs: Minimal assistance,Rolling walker General ADL Comments: pt with decreased activity tolerance, decreased endurance, Lsided weakness and decreased coordination    Mobility  Overal bed mobility: Needs Assistance Bed Mobility: Supine to Sit,Sit to Supine Supine to sit: HOB elevated,Min assist Sit to supine: HOB elevated,Min assist General bed mobility comments: MinA to manage L LE for getting in/out of bed, much more fatigued this afternoon    Transfers  Overall transfer level: Needs assistance Equipment used: Rolling walker (2 wheeled) Transfer via Lift Equipment: Stedy Transfers: Sit to/from [employer] to Stand: Min guard Stand pivot transfers: Total assist General transfer comment: MinA to boost up to standing position and gain balance with RW as well as to place L UE on walker and get a decent grip    Ambulation / Gait / Stairs / Wheelchair Mobility  Ambulation/Gait Ambulation/Gait assistance: Counsellor (Feet): 120 Feet Assistive device: Rolling walker (2 wheeled) Gait Pattern/deviations: Step-through pattern,Decreased step length - right,Decreased step length - left,Decreased stride length,Narrow base of support,Trunk flexed General Gait Details: slow and steady with RW, no signficant balance deficits with BUE support, but stil wtih mild L sided weakness functionally Gait velocity: decreased Stairs: Yes Stairs assistance: Min assist Stair Management: One rail Right,Step to pattern,Forwards Number of Stairs: 2 General stair comments: significant difficulty with steps- needed MinA for balance/safety and Max cues for proper sequencing and technique. Very  fatigued after only 2 steps.    Posture / Balance Dynamic Sitting Balance Sitting balance - Comments: statically Balance Overall balance assessment: Needs assistance Sitting-balance support: Bilateral upper extremity supported,Feet supported Sitting balance-Leahy Scale: Good Sitting balance - Comments: statically Postural control: Posterior lean Standing balance support: No upper extremity supported,During functional activity Standing balance-Leahy Scale: Fair Standing balance comment: able to perform dynamic balance tasks/reaching with min guard/wide BOS and no UE support    Special needs/care consideration Diabetic management yes     Previous Home Environment (from acute therapy documentation) Living Arrangements: Spouse/significant other,Other (Comment) (also mother in law, and son) Available Help at Discharge: Family,Available 24 hours/day Type of Home: Mobile home Home Layout: One level Home Access: Stairs to enter Entrance Stairs-Rails: Can reach both Entrance Stairs-Number of Steps: 5 Bathroom Shower/Tub: Walk-in shower (two steps up to get into shower) Biochemist, clinical: [place]: No 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  Living Setting: Patient's home Type of Home at Discharge: Mobile home Discharge Home Layout: One level Discharge Home Access: Stairs to enter Entrance Stairs-Rails: Can reach both (states the rails are wooden and several inches wide, hard to grasp) Entrance Stairs-Number of Steps: 5 Discharge Bathroom Shower/Tub: Tub/shower unit Discharge Bathroom Toilet: Standard Discharge Bathroom Accessibility: No Does the patient have any problems obtaining your medications?: Yes (Describe) (uninsured)  Social/Family/Support Systems Patient Roles: Spouse,Parent Anticipated Caregiver: Tyjae Shvartsman (spouse), as well as pt's son and MIL Anticipated Caregiver's Contact Information:  Evangeline Gula [phone] Ability/Limitations of Caregiver: supervision/min assist Caregiver Availability: 24/7 Discharge Plan Discussed with Primary Caregiver: Yes Is Caregiver In Agreement with Plan?: Yes Does Caregiver/Family have Issues with Lodging/Transportation while Pt is in Rehab?: No   Goals Patient/Family Goal for Rehab: PT/OT supervision to min assist, SLP n/a Expected length of stay: 9-12 days Additional Information: [facility] to [facility] for medicaid screen Pt/Family Agrees to Admission and willing to participate: Yes Program Orientation Provided & Reviewed with Pt/Caregiver Including Roles  & Responsibilities: Yes  Barriers to Discharge: Insurance for SNF coverage   Decrease burden of Care through IP rehab admission: n/a   Possible need for SNF placement upon discharge:Not anticipated   Patient Condition: This patient's condition remains as documented in the consult dated 12/25/20, in which the Rehabilitation Physician determined and documented that the patient's condition is appropriate for intensive rehabilitative care in an inpatient rehabilitation facility. Will admit to inpatient rehab Saturday 12/27/20.  Preadmission Screen Completed By:  Michel Santee, PT, 12/26/2020 1:52 PM ______________________________________________________________________   Discussed status with Dr. Naaman Plummer on 12/26/20 at 1:54 PM  and received approval for admission today.  Admission Coordinator:  Michel Santee, PT, DPT time 1:54 PM Sudie Grumbling 12/26/20

## 2020-12-26 NOTE — Progress Notes (Addendum)
STROKE TEAM PROGRESS NOTE    INTERVAL HISTORY No acute overnight events. Patient sitting comfortably in chair this morning. She states she feels much better this morning. Notes was able to work with physical therapy today and felt confident about her ongoing recovery. PT suggests CIR. Denies any new complains. Feels happy about changing her Eliquis to Pradaxa. Blood pressure well controlled. Neurological exam unchanged and stable. She is stable from stroke team point of view.   Vitals:   12/26/20 0015 12/26/20 0444 12/26/20 0814 12/26/20 1211  BP: 134/74 (!) 143/85 (!) 157/82 (!) 144/72  Pulse: 69 61 (!) 51 61  Resp: 18 18 18 18   Temp: 98.3 F (36.8 C) 97.7 F (36.5 C) 98.4 F (36.9 C) 97.7 F (36.5 C)  TempSrc: Oral Oral Oral Oral  SpO2: 97% 97% 96% 96%  Weight:      Height:       CBC:  Recent Labs  Lab 12/24/20 1034  WBC 5.4  NEUTROABS 4.0  HGB 13.0  HCT 39.8  MCV 86.9  PLT 163   Basic Metabolic Panel:  Recent Labs  Lab 12/24/20 1034 12/26/20 0658  NA 139 140  K 3.3* 2.7*  CL 102 102  CO2 24 24  GLUCOSE 305* 176*  BUN 32* 34*  CREATININE 2.12* 1.97*  CALCIUM 9.4 9.4  MG  --  2.3   Lipid Panel:  Recent Labs  Lab 12/25/20 0202  CHOL 260*  TRIG 141  HDL 59  CHOLHDL 4.4  VLDL 28  LDLCALC 173*   HgbA1c:  Recent Labs  Lab 12/25/20 0202  HGBA1C 8.0*   Urine Drug Screen:  Recent Labs  Lab 12/24/20 1859  LABOPIA NONE DETECTED  COCAINSCRNUR NONE DETECTED  LABBENZ NONE DETECTED  AMPHETMU NONE DETECTED  THCU NONE DETECTED  LABBARB NONE DETECTED     IMAGING past 24 hours  CT Head  IMPRESSION: 1. Small area of hypoattenuation in the left periatrial white matter could represent chronic microvascular ischemic disease or age indeterminate lacunar infarct. Additional age indeterminate lacunar infarct within the right corona radiata, favored remote. If there is concern for acute infarct, MRI could further evaluate. 2. No acute hemorrhage. 3.  Punctate foreign body in the subcutaneous high posterior left scalp, likely remote given no scalp swelling.  MR Brain wo  IMPRESSION: 1. Acute infarct in the right hemipons. Additional small acute infarct in the left frontal subcortical white matter. 2. Mild associated edema in the right pons without substantial mass effect. 3. Small remote lacunar infarcts in the right corona radiata and left periatrial white matter.  MR Angio Head  IMPRESSION: Normal intracranial MRA. No large vessel occlusion, hemodynamically significant stenosis, or other acute vascular abnormality.  Echocardiogram  IMPRESSIONS    1. Since the last study on 06/16/20 LVEF remains mildly decreased with  diffuse hypokinesis and LVEF 45-50%. RVEF is mildly decreased, RV is now  less dilated and RVSP has improved from 50 to 30 mmHg.   2. Left ventricular ejection fraction, by estimation, is 45 to 50%. The  left ventricle has mildly decreased function. The left ventricle  demonstrates global hypokinesis. Left ventricular diastolic function could  not be evaluated.   3. Right ventricular systolic function is mildly reduced. The right  ventricular size is normal. There is normal pulmonary artery systolic  pressure. The estimated right ventricular systolic pressure is 84.6 mmHg.   4. Left atrial size was mildly dilated.   5. The mitral valve is normal in structure. Mild mitral  valve  regurgitation. No evidence of mitral stenosis.   6. The aortic valve is normal in structure. Aortic valve regurgitation is  not visualized. No aortic stenosis is present.   7. The inferior vena cava is normal in size with <50% respiratory  variability, suggesting right atrial pressure of 8 mmHg.     PHYSICAL EXAM  General: Obese, middle age African-American female sitting comfortably in bed, NAD CV: RRR, S1, S2 heard, No m/r/g Pulmonary: Clear to ausculation bilaterally. No wheezes, no rales Abdomen: Soft, no rigidity, no  abdominal tenderness, BS+ Psych: Pleasant mood and affect Neurological:  Mental Status: Alert, oriented, thought content appropriate.  Speech fluent with mild dysarthria which is improving.  Able to follow 3 step commands without difficulty. Cranial Nerves: II:  Visual fields grossly normal, pupils equal, round, reactive to light and accommodation III,IV, VI: ptosis not present, extra-ocular motions intact bilaterally V,VII: smile asymmetric with left facial droop, facial light touch sensation normal bilaterally VIII: hearing normal bilaterally IX,X: uvula rises symmetrically XI: bilateral shoulder shrug XII: midline tongue extension without atrophy or fasciculations  Motor: Right : Upper extremity   5/5    Left:     Upper extremity   3/5 with drift  Lower extremity   5/5     Lower extremity   4/5 , Able to hold both lower extremities for 5 sec but LLE lower than RLE.  Tone and bulk:normal tone throughout; no atrophy noted Sensory: Pinprick and light touch intact throughout, bilaterally Plantars: Right: downgoing   Left: downgoing Cerebellar: normal finger-to-nose,  normal heel-to-shin test Gait: Deferred   NIH stroke scale 3. Premorbid modified Rankin's score 0  ASSESSMENT/PLAN [name] is a 55 y.o. 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 No large vessel occlusion, hemodynamically  significant stenosis, or other acute vascular abnormality. MRI : Acute infarct in the right hemipons. Additional small acute infarct in the left frontal subcortical white matter. Mild associated edema in the right pons without substantial mass effect. Small remote lacunar infarcts in the right corona radiata and left periatrial white matter. Carotid US:  Right Carotid: Velocities in the right ICA are consistent with a 1-39%  Stenosis. Velocities in the left ICA are consistent with a 1-39%  stenosis.  2D Echo: Left ventricular ejection fraction, by estimation, is 45 to 50%. LDL 173 HgbA1c 8.0 VTE prophylaxis - On Eliquis     Diet   Diet heart healthy/carb modified Room service appropriate? Yes; Fluid consistency: Thin   Eliquis (apixaban) daily prior to admission, now on Pradaxa 150 mg BID. Therapy recommendations:  CIR Disposition:  Pending  Hypertension Home meds:  Norvasc, Coreg, Hydralazine  Stable Permissive hypertension (OK if < 220/120) but gradually normalize in 5-7 days Long-term BP goal normotensive  Hyperlipidemia Home meds:  Lipitor 40 mg, increased to Lipitor 80 mg in hospital LDL 173, goal < 70 Continue statin at discharge  Diabetes type  II UncControlled Home meds:  Lantus HgbA1c 8.0, goal < 7.0 CBGs Recent Labs    12/25/20 2150 12/26/20 0617 12/26/20 1129  GLUCAP 115* 147* 374*    SSI    Other's:    A-fibrillation Home meds: rate controlled with Coreg (Hold) and Mexiletine (Resumed) Dabigatran (Pradaxa)   Other Stroke Risk Factors Obesity, BMI >/= 30 associated with increased stroke risk, recommend weight loss, diet and exercise as appropriate  Coronary artery disease Obstructive sleep apnea, possibly, would undergo testing tonight Congestive heart failure  Adrienne Junes, MD PGY1 Resident  Hospital day # 2 Patient presented with left hemiparesis due to right pontine lacunar infarct with slight fluctuating symptoms. She does have atrial  fibrillation and was quite compliant with her Eliquis.  Agree with changing to Pradaxa if it does not  significantly impact her co-pays and she is able to afford it. Continue   aggressive risk factor modification. Patient is participating in the sleep smart study. Prevention.  And tested positive for sleep apnea on the overnight NOx 3 monitor last night.  She will try CPAP mask toleration tonight.    Long discussion patient and daughter at the bedside and answered questions. Discussed with Dr. Nevada Crane. Greater than 50% time during this 25-minute visit was spent on counseling and coordination of care about her pontine stroke and risk for sleep apnea and answered questions. [name], MD   To contact Stroke Continuity provider, please refer to [email or web address] After hours, contact General Neurology

## 2020-12-27 ENCOUNTER — Inpatient Hospital Stay (HOSPITAL_COMMUNITY)
Admission: RE | Admit: 2020-12-27 | Discharge: 2021-01-03 | DRG: 056 | Disposition: A | Discharge status: Other Acute Inpatient Hospital | Payer: Self-pay | Source: Intra-hospital | Attending: Physical Medicine & Rehabilitation | Admitting: Physical Medicine & Rehabilitation

## 2020-12-27 ENCOUNTER — Encounter (HOSPITAL_COMMUNITY): Payer: Self-pay | Admitting: Physical Medicine & Rehabilitation

## 2020-12-27 ENCOUNTER — Other Ambulatory Visit: Payer: Self-pay

## 2020-12-27 DIAGNOSIS — E1142 Type 2 diabetes mellitus with diabetic polyneuropathy: Secondary | ICD-10-CM | POA: Diagnosis present

## 2020-12-27 DIAGNOSIS — Z823 Family history of stroke: Secondary | ICD-10-CM

## 2020-12-27 DIAGNOSIS — I69354 Hemiplegia and hemiparesis following cerebral infarction affecting left non-dominant side: Principal | ICD-10-CM

## 2020-12-27 DIAGNOSIS — I480 Paroxysmal atrial fibrillation: Secondary | ICD-10-CM

## 2020-12-27 DIAGNOSIS — I130 Hypertensive heart and chronic kidney disease with heart failure and stage 1 through stage 4 chronic kidney disease, or unspecified chronic kidney disease: Secondary | ICD-10-CM | POA: Diagnosis present

## 2020-12-27 DIAGNOSIS — I428 Other cardiomyopathies: Secondary | ICD-10-CM

## 2020-12-27 DIAGNOSIS — N183 Chronic kidney disease, stage 3 unspecified: Secondary | ICD-10-CM

## 2020-12-27 DIAGNOSIS — E876 Hypokalemia: Secondary | ICD-10-CM | POA: Diagnosis not present

## 2020-12-27 DIAGNOSIS — Z794 Long term (current) use of insulin: Secondary | ICD-10-CM

## 2020-12-27 DIAGNOSIS — R509 Fever, unspecified: Secondary | ICD-10-CM

## 2020-12-27 DIAGNOSIS — E1169 Type 2 diabetes mellitus with other specified complication: Secondary | ICD-10-CM

## 2020-12-27 DIAGNOSIS — Z79899 Other long term (current) drug therapy: Secondary | ICD-10-CM

## 2020-12-27 DIAGNOSIS — I499 Cardiac arrhythmia, unspecified: Secondary | ICD-10-CM

## 2020-12-27 DIAGNOSIS — R0989 Other specified symptoms and signs involving the circulatory and respiratory systems: Secondary | ICD-10-CM

## 2020-12-27 DIAGNOSIS — Z833 Family history of diabetes mellitus: Secondary | ICD-10-CM

## 2020-12-27 DIAGNOSIS — I10 Essential (primary) hypertension: Secondary | ICD-10-CM

## 2020-12-27 DIAGNOSIS — N1832 Chronic kidney disease, stage 3b: Secondary | ICD-10-CM

## 2020-12-27 DIAGNOSIS — Z6834 Body mass index (BMI) 34.0-34.9, adult: Secondary | ICD-10-CM

## 2020-12-27 DIAGNOSIS — I5032 Chronic diastolic (congestive) heart failure: Secondary | ICD-10-CM | POA: Diagnosis present

## 2020-12-27 DIAGNOSIS — K5901 Slow transit constipation: Secondary | ICD-10-CM | POA: Diagnosis present

## 2020-12-27 DIAGNOSIS — J029 Acute pharyngitis, unspecified: Secondary | ICD-10-CM

## 2020-12-27 DIAGNOSIS — I6350 Cerebral infarction due to unspecified occlusion or stenosis of unspecified cerebral artery: Secondary | ICD-10-CM | POA: Diagnosis present

## 2020-12-27 DIAGNOSIS — N184 Chronic kidney disease, stage 4 (severe): Secondary | ICD-10-CM | POA: Diagnosis present

## 2020-12-27 DIAGNOSIS — I69392 Facial weakness following cerebral infarction: Secondary | ICD-10-CM

## 2020-12-27 DIAGNOSIS — Z7984 Long term (current) use of oral hypoglycemic drugs: Secondary | ICD-10-CM

## 2020-12-27 DIAGNOSIS — I69328 Other speech and language deficits following cerebral infarction: Secondary | ICD-10-CM

## 2020-12-27 DIAGNOSIS — E669 Obesity, unspecified: Secondary | ICD-10-CM | POA: Diagnosis present

## 2020-12-27 DIAGNOSIS — E785 Hyperlipidemia, unspecified: Secondary | ICD-10-CM | POA: Diagnosis present

## 2020-12-27 DIAGNOSIS — E1165 Type 2 diabetes mellitus with hyperglycemia: Secondary | ICD-10-CM

## 2020-12-27 DIAGNOSIS — Z8249 Family history of ischemic heart disease and other diseases of the circulatory system: Secondary | ICD-10-CM

## 2020-12-27 DIAGNOSIS — E782 Mixed hyperlipidemia: Secondary | ICD-10-CM

## 2020-12-27 DIAGNOSIS — Z7901 Long term (current) use of anticoagulants: Secondary | ICD-10-CM

## 2020-12-27 DIAGNOSIS — R4781 Slurred speech: Secondary | ICD-10-CM | POA: Diagnosis present

## 2020-12-27 DIAGNOSIS — U071 COVID-19: Secondary | ICD-10-CM | POA: Diagnosis not present

## 2020-12-27 DIAGNOSIS — R7309 Other abnormal glucose: Secondary | ICD-10-CM

## 2020-12-27 DIAGNOSIS — E1122 Type 2 diabetes mellitus with diabetic chronic kidney disease: Secondary | ICD-10-CM | POA: Diagnosis present

## 2020-12-27 DIAGNOSIS — I2510 Atherosclerotic heart disease of native coronary artery without angina pectoris: Secondary | ICD-10-CM | POA: Diagnosis present

## 2020-12-27 DIAGNOSIS — I482 Chronic atrial fibrillation, unspecified: Secondary | ICD-10-CM

## 2020-12-27 DIAGNOSIS — J9811 Atelectasis: Secondary | ICD-10-CM | POA: Diagnosis present

## 2020-12-27 LAB — BASIC METABOLIC PANEL
Anion gap: 12 (ref 5–15)
BUN: 36 mg/dL — ABNORMAL HIGH (ref 6–20)
CO2: 23 mmol/L (ref 22–32)
Calcium: 9.1 mg/dL (ref 8.9–10.3)
Chloride: 104 mmol/L (ref 98–111)
Creatinine, Ser: 1.81 mg/dL — ABNORMAL HIGH (ref 0.44–1.00)
GFR, Estimated: 33 mL/min — ABNORMAL LOW (ref >60)
Glucose, Bld: 163 mg/dL — ABNORMAL HIGH (ref 70–99)
Potassium: 3.4 mmol/L — ABNORMAL LOW (ref 3.5–5.1)
Sodium: 139 mmol/L (ref 135–145)

## 2020-12-27 LAB — GLUCOSE, CAPILLARY
Glucose-Capillary: 155 mg/dL — ABNORMAL HIGH (ref 70–99)
Glucose-Capillary: 246 mg/dL — ABNORMAL HIGH (ref 70–99)
Glucose-Capillary: 132 mg/dL — ABNORMAL HIGH (ref 70–99)
Glucose-Capillary: 236 mg/dL — ABNORMAL HIGH (ref 70–99)

## 2020-12-27 MED ORDER — DABIGATRAN ETEXILATE MESYLATE 150 MG PO CAPS: 150.0000 mg | ORAL_CAPSULE | Freq: Two times a day (BID) | ORAL | 0 refills | Status: DC

## 2020-12-27 MED ORDER — SENNOSIDES-DOCUSATE SODIUM 8.6-50 MG PO TABS
1.0000 | ORAL_TABLET | Freq: Every evening | ORAL | Status: DC | PRN
  Administered 2021-01-01: 1 via ORAL
  Filled 2020-12-27: qty 1

## 2020-12-27 MED ORDER — HYDRALAZINE HCL 100 MG PO TABS: 50.0000 mg | ORAL_TABLET | Freq: Three times a day (TID) | ORAL | 0 refills | Status: DC

## 2020-12-27 MED ORDER — GLUCERNA SHAKE PO LIQD
237.0000 mL | Freq: Two times a day (BID) | ORAL | Status: DC
  Administered 2020-12-28 – 2021-01-03 (×9): 237 mL via ORAL

## 2020-12-27 MED ORDER — OSCAL 500/200 D-3 500-200 MG-UNIT PO TABS: 1.0000 | ORAL_TABLET | Freq: Every day | ORAL | 0 refills | Status: DC

## 2020-12-27 MED ORDER — MEXILETINE HCL 150 MG PO CAPS
300.0000 mg | ORAL_CAPSULE | Freq: Two times a day (BID) | ORAL | Status: DC
  Administered 2020-12-27 – 2021-01-03 (×14): 300 mg via ORAL
  Filled 2020-12-27 (×16): qty 2

## 2020-12-27 MED ORDER — ACETAMINOPHEN 650 MG RE SUPP: 650.0000 mg | RECTAL | Status: DC | PRN

## 2020-12-27 MED ORDER — CARVEDILOL 3.125 MG PO TABS
3.1250 mg | ORAL_TABLET | Freq: Two times a day (BID) | ORAL | Status: DC
  Administered 2020-12-28 – 2021-01-03 (×14): 3.125 mg via ORAL
  Filled 2020-12-27 (×15): qty 1

## 2020-12-27 MED ORDER — ACETAMINOPHEN 160 MG/5ML PO SOLN
650.0000 mg | ORAL | Status: DC | PRN
Start: 2020-12-27 — End: 2021-01-03

## 2020-12-27 MED ORDER — ACETAMINOPHEN 325 MG PO TABS
650.0000 mg | ORAL_TABLET | ORAL | Status: DC | PRN
  Administered 2020-12-28 – 2021-01-03 (×6): 650 mg via ORAL
  Filled 2020-12-27 (×6): qty 2

## 2020-12-27 MED ORDER — ASPIRIN 81 MG PO CHEW
81.0000 mg | CHEWABLE_TABLET | Freq: Every day | ORAL | Status: DC
  Administered 2020-12-28 – 2021-01-03 (×7): 81 mg via ORAL
  Filled 2020-12-27 (×7): qty 1

## 2020-12-27 MED ORDER — CARVEDILOL 3.125 MG PO TABS: 3.1250 mg | ORAL_TABLET | Freq: Two times a day (BID) | ORAL | 0 refills | Status: DC

## 2020-12-27 MED ORDER — INSULIN GLARGINE 100 UNIT/ML ~~LOC~~ SOLN
20.0000 [IU] | SUBCUTANEOUS | Status: DC
  Administered 2020-12-28 – 2021-01-03 (×7): 20 [IU] via SUBCUTANEOUS
  Filled 2020-12-27 (×8): qty 0.2

## 2020-12-27 MED ORDER — DABIGATRAN ETEXILATE MESYLATE 150 MG PO CAPS
150.0000 mg | ORAL_CAPSULE | Freq: Two times a day (BID) | ORAL | Status: DC
  Administered 2020-12-27 – 2021-01-03 (×14): 150 mg via ORAL
  Filled 2020-12-27 (×16): qty 1

## 2020-12-27 MED ORDER — INSULIN ASPART 100 UNIT/ML ~~LOC~~ SOLN
0.0000 [IU] | Freq: Three times a day (TID) | SUBCUTANEOUS | Status: DC
  Administered 2020-12-28 – 2020-12-31 (×6): 2 [IU] via SUBCUTANEOUS
  Administered 2021-01-02: 3 [IU] via SUBCUTANEOUS
  Administered 2021-01-03: 5 [IU] via SUBCUTANEOUS
  Administered 2021-01-03: 3 [IU] via SUBCUTANEOUS

## 2020-12-27 MED ORDER — GLUCERNA SHAKE PO LIQD: 237.0000 mL | Freq: Two times a day (BID) | ORAL | 0 refills | Status: AC

## 2020-12-27 MED ORDER — ASPIRIN 81 MG PO CHEW: 81.0000 mg | CHEWABLE_TABLET | Freq: Every day | ORAL | 0 refills | Status: AC

## 2020-12-27 MED ORDER — HYDRALAZINE HCL 50 MG PO TABS
50.0000 mg | ORAL_TABLET | Freq: Three times a day (TID) | ORAL | Status: DC
  Administered 2020-12-27 (×2): 50 mg via ORAL
  Filled 2020-12-27 (×2): qty 1

## 2020-12-27 MED ORDER — ATORVASTATIN CALCIUM 80 MG PO TABS
80.0000 mg | ORAL_TABLET | Freq: Every evening | ORAL | Status: DC
  Administered 2020-12-28 – 2021-01-03 (×7): 80 mg via ORAL
  Filled 2020-12-27 (×7): qty 1

## 2020-12-27 MED ORDER — ATORVASTATIN CALCIUM 80 MG PO TABS: 80.0000 mg | ORAL_TABLET | Freq: Every evening | ORAL | 0 refills | Status: DC

## 2020-12-27 MED ORDER — POTASSIUM CHLORIDE CRYS ER 20 MEQ PO TBCR
40.0000 meq | EXTENDED_RELEASE_TABLET | Freq: Two times a day (BID) | ORAL | Status: DC
  Administered 2020-12-27: 40 meq via ORAL
  Filled 2020-12-27: qty 2

## 2020-12-27 NOTE — Discharge Summary (Addendum)
Discharge Summary  Adrienne Lane UYQ:[id number] DOB: 1966/03/19  PCP: Nicolette Bang, DO  Admit date: 12/24/2020 Discharge date: 01/01/2021  Time spent: 35 minutes  Recommendations for Outpatient Follow-up:  1. Follow-up with neurology in 1 to 2 weeks 2. Follow-up with your cardiologist in 1 to 2 weeks 3. Follow-up with your PCP in 1 to 2 weeks. 4. Take your medications as prescribed 5. Continue PT OT with assistance and fall precautions   Discharge Diagnoses:  Active Hospital Problems   Diagnosis Date Noted   Acute CVA (cerebrovascular accident) ([place]) 12/24/2020   Stage 3b chronic kidney disease ([place])    Hyperlipidemia 10/24/2020   Non-ischemic cardiomyopathy (HCC)    Paroxysmal atrial fibrillation ([place]) 12/18/2019   Type 2 diabetes mellitus [facility])    Essential hypertension     Resolved Hospital Problems  No resolved problems to display.    Discharge Condition: Stable  Diet recommendation: Resume previous diet.  Vitals:   12/27/20 0500 12/27/20 1145  BP: (!) 151/99 (!) 135/91  Pulse: (!) 55 66  Resp: 16 16  Temp: 97.8 F (36.6 C) 97.9 F (36.6 C)  SpO2: 96% 99%    History of present illness:   Adrienne Lane a 55 y.o.femalewith medical history significant ofobesity; DM2; paroxysmal afib on Eliquis; stage IIIB CKD; nephrolithiasis; and chronic systolic CHF presenting with stroke-like symptoms.She reports that she couldn't control her left side. She noticed it in the middle of the night and then noticed it more the morning of her presentation.   Associated with blurry vision, left facial droop with drooling.  LKW unknown.  She presented outside of window for TPA, it was not given.  Seen by neurology/stroke team.  Completed work-up for CVA.  MRI brain showed acute infarct in the right hemipons, additional small acute infarct in the left frontal subcortical white matter.  Mild associated edema in the right pons without substantial  mass-effect.  Small remote lacunar infarct in the right corona radiata and left periatrial white matter.  She had no significant carotid artery stenosis.  2D echo showed reduced LVEF 45 to 50%, left ventricle global hypokinesis.  No PFO.  No intracardiac source of thrombus.  Hemoglobin A1c 8.0, and LDL 173, not at goal.  Aspirin 81 mg was added and Eliquis was switched to Pradaxa.  PT recommended CIR, accepted.  12/27/20:  Seen and examined at her bedside.  She is eager to start her therapies at inpatient rehab.   Hospital Course:  Principal Problem:   Acute CVA (cerebrovascular accident) [facility]) Active Problems:   Type 2 diabetes mellitus ([place])   Essential hypertension   Paroxysmal atrial fibrillation (HCC)   Non-ischemic cardiomyopathy (HCC)   Hyperlipidemia   Stage 3b chronic kidney disease ([place])   Acute ischemic CVA involving right hemipons, left frontal subcortical white matter Mild associated edema in the right pons without substantial mass-effect. Small remote lacunar infarct in the right corona radiata and left periorbital white matter. Patient states she was compliant with her home medications including Eliquis for paroxysmal A. fib.   She is currently on aspirin 81 mg daily, Pradaxa, and Lipitor 80 mg daily per neurosurgery/stroke team recommendations. LDL 173, goal is less than 70 Hemoglobin A1c 8.0, goal is less than 7.0 She had no significant carotid artery stenosis.  2D echo showed reduced LVEF 45 to 50% with left ventricle global hypokinesis.  No PFO.  No intracardiac source of thrombus. PT recommended CIR, accepted. Continue PT OT with assistance and fall precautions Long-term BP  goal normotensive Currently on Coreg 3.125 mg twice daily, p.o. hydralazine 50 mg 3 times daily, mexiletine 300 mg twice daily. Closely monitor BP Follow-up with neurology  Essential hypertension Gradually normalizing BP. Home Norvasc, Imdur, and torsemide on hold. Currently on Coreg 3.125 mg  twice daily, p.o. hydralazine 50 mg 3 times daily, mexiletine 300 mg twice daily. Closely monitor BP  Refractory hypokalemia Initially presented with serum potassium 2.7 Improving with replacement\ Repeat serum potassium 3.4 this morning Received KCl 40 mEq x 2 doses. Repeat BMP on Wednesday, 12/31/2020.  AKI on CKD 3B likely prerenal in the setting of dehydration from poor oral intake. Baseline creatinine appears to be 1.5 with GFR of 44 Presented with creatinine of 2.1 with GFR of 28 Creatinine is downtrending 1.8 with GFR of 33 Continue to avoid nephrotoxins and dehydration Monitor urine output  Chronic systolic CHF 2D echo done on 12/25/2018 shows reduced LVEF 45 to 50% with left ventricle global hypokinesis Euvolemic on exam Diuretics on hold due to recent AKI. Continue strict I's and O's and daily weight No urine output recorded, she has been voiding.  Hyperlipidemia LDL 173, not at goal, goal is less than 70 Continue Lipitor 80 mg daily.  Type 2 diabetes with hyperglycemia Hemoglobin A1c 8.0 Goal hemoglobin A1c less than 7.0. Resume home regimen. Follow-up with your PCP in 1 to 2 weeks.  Ambulatory dysfunction in the setting of acute stroke Management as stated above. Discharge to CIR on 12/27/2020.  History of low vitamin D3 Vitamin D3 level 21 on 10/23/2020 Continue vitamin D3 replacement    Code Status:   Family Communication: Updated her daughter via phone on 12/25/2020.  Disposition Plan: Likely will DC to CIR on 12/27/2020  Consultants:  Neurology/stroke team  CIR  Procedures:  2D echo  Antimicrobials:  None.  DVT prophylaxis: Pradaxa    Discharge Exam: BP (!) 135/91 (BP Location: Left Arm)    Pulse 66    Temp 97.9 F (36.6 C) (Oral)    Resp 16    Ht 5\' 8"  (1.727 m) Comment: from 07/21/2020   Wt 101.6 kg Comment: from 10/23/2020   SpO2 99%    BMI 34.06 kg/m   General: 55 y.o. year-old female well developed well nourished in no  acute distress.  Alert and oriented x3.  Cardiovascular: Regular rate and rhythm with no rubs or gallops.  No thyromegaly or JVD noted.    Respiratory: Clear to auscultation with no wheezes or rales. Good inspiratory effort.  Abdomen: Soft nontender nondistended with normal bowel sounds x4 quadrants.  Musculoskeletal: No lower extremity edema bilaterally.    Skin: No ulcerative lesions noted or rashes  Psychiatry: Mood is appropriate for condition and setting  Discharge Instructions You were cared for by a hospitalist during your hospital stay. If you have any questions about your discharge medications or the care you received while you were in the hospital after you are discharged, you can call the unit and asked to speak with the hospitalist on call if the hospitalist that took care of you is not available. Once you are discharged, your primary care physician will handle any further medical issues. Please note that NO REFILLS for any discharge medications will be authorized once you are discharged, as it is imperative that you return to your primary care physician (or establish a relationship with a primary care physician if you do not have one) for your aftercare needs so that they can reassess your need for medications and monitor  your lab values.  Discharge Instructions    Ambulatory referral to Neurology   Complete by: As directed    Follow up with stroke clinic NP (Jessica Vanschaick or Cecille Rubin, if both not available, consider Zachery Dauer, or [place]) at [facility] in about 4 weeks. Thanks.     Allergies as of 12/27/2020      Reactions   Adhesive [tape]    Tears skin, can tolerate paper tape      Medication List    STOP taking these medications   apixaban 5 MG Tabs tablet Commonly known as: ELIQUIS   isosorbide mononitrate 60 MG 24 hr tablet Commonly known as: IMDUR   potassium chloride 10 MEQ tablet Commonly known as: KLOR-CON   torsemide 20 MG tablet Commonly known  as: DEMADEX     TAKE these medications   amLODipine 5 MG tablet Commonly known as: NORVASC Take 1 tablet (5 mg total) by mouth daily.   aspirin 81 MG chewable tablet Chew 1 tablet (81 mg total) by mouth daily.   atorvastatin 80 MG tablet Commonly known as: LIPITOR Take 1 tablet (80 mg total) by mouth every evening. What changed:   medication strength  how much to take  when to take this   carvedilol 3.125 MG tablet Commonly known as: COREG Take 1 tablet (3.125 mg total) by mouth 2 (two) times daily with a meal. What changed:   medication strength  how much to take   dabigatran 150 MG Caps capsule Commonly known as: PRADAXA Take 1 capsule (150 mg total) by mouth every 12 (twelve) hours.   dapagliflozin propanediol 10 MG Tabs tablet Commonly known as: Farxiga Take 1 tablet (10 mg total) by mouth daily before breakfast.   glucose blood test strip Use as instructed   hydrALAZINE 100 MG tablet Commonly known as: APRESOLINE Take 0.5 tablets (50 mg total) by mouth 3 (three) times daily. What changed:   how much to take  when to take this   insulin glargine 100 UNIT/ML injection Commonly known as: LANTUS Inject 0.2 mLs (20 Units total) into the skin every morning.   mexiletine 150 MG capsule Commonly known as: MEXITIL TAKE 2 CAPSULES (300 MG TOTAL) BY MOUTH EVERY 12 (TWELVE) HOURS.   onetouch ultrasoft lancets Check CBG twice a day   Oscal 500/200 D-3 500-200 MG-UNIT tablet Generic drug: calcium-vitamin D Take 1 tablet by mouth daily with breakfast.   TRUEplus Insulin Syringe 30G X 5/16" 0.5 ML Misc Generic drug: Insulin Syringe-Needle U-100 USE TO INJECT 12 UNITS AT BEDTIME.   WOMENS MULTI PO Take 1 tablet by mouth daily.     ASK your doctor about these medications   feeding supplement (GLUCERNA SHAKE) Liqd Take 237 mLs by mouth 2 (two) times daily between meals for 3 days. Ask about: Should I take this medication?      Allergies  Allergen  Reactions   Adhesive [Tape]     Tears skin, can tolerate paper tape    Follow-up Information    [facility] Follow up.   Why: January 01, 2021 at 1030  Contact information: [address], [address] [place] [address]       [facility] Follow up.   Why: Use pharmacy for prescriptions after discharge  Contact information: [address] [place] [address] [place], [place], DO. Call in 1 day(s).   Specialty: Family Medicine Why: please  call for a post hospital follow up appointment Contact information: [place] [address] [phone]        Donato Heinz, MD .   Specialties: Cardiology, Radiology Contact information: [address] [place] [address] [phone]        Garvin Fila, MD. Schedule an appointment as soon as possible for a visit in 4 week(s).   Specialties: Neurology, Radiology Why: please call for a post hospital follow up appointment. Contact information: [address] [place] [address] [phone]                The results of significant diagnostics from this hospitalization (including imaging, microbiology, ancillary and laboratory) are listed below for reference.    Significant Diagnostic Studies: CT HEAD WO CONTRAST  Result Date: 12/24/2020 CLINICAL DATA:  Neuro deficit, acute stroke suspected. Abnormal gait and aphasia stray. Today's having left-sided weakness. EXAM: CT HEAD WITHOUT CONTRAST TECHNIQUE: Contiguous axial images were obtained from the base of the skull through the vertex without intravenous contrast. COMPARISON:  None. FINDINGS: Brain: Small area of hypoattenuation in the left periatrial white matter (see series 5, image 44 and series 3, image 15). Additional small area of hypoattenuation in the right corona radiata. No acute hemorrhage.  No hydrocephalus. No mass lesion. No abnormal mass effect. No midline shift. No extra-axial fluid collection. Vascular: No hyperdense vessel identified. Calcific atherosclerosis. Skull: No acute fracture. Punctate foreign body in the subcutaneous high posterior left scalp without soft tissue swelling. Sinuses/Orbits: No acute finding. Other: No mastoid effusions. IMPRESSION: 1. Small area of hypoattenuation in the left periatrial white matter could represent chronic microvascular ischemic disease or age indeterminate lacunar infarct. Additional age indeterminate lacunar infarct within the right corona radiata, favored remote. If there is concern for acute infarct, MRI could further evaluate. 2. No acute hemorrhage. 3. Punctate foreign body in the subcutaneous high posterior left scalp, likely remote given no scalp swelling. Electronically Signed   By: Margaretha Sheffield MD   On: 12/24/2020 12:30   MR ANGIO HEAD WO CONTRAST  Result Date: 12/24/2020 CLINICAL DATA:  Follow-up examination for acute stroke. EXAM: MRA HEAD WITHOUT CONTRAST TECHNIQUE: Angiographic images of the Circle of Willis were obtained using MRA technique without intravenous contrast. COMPARISON:  Prior MRI from earlier the same day. FINDINGS: ANTERIOR CIRCULATION: Visualized distal cervical segments of the internal carotid arteries are widely patent with symmetric antegrade flow. Petrous, cavernous, and supraclinoid segments widely patent without stenosis or other abnormality. A1 segments widely patent. Normal anterior communicating artery complex. Anterior cerebral arteries widely patent to their distal aspects. No M1 stenosis or occlusion. Normal MCA bifurcations. Distal MCA branches well perfused and symmetric. POSTERIOR CIRCULATION: Both V4 segments widely patent to the vertebrobasilar junction without stenosis. Left vertebral artery dominant. Both PICA origins patent and normal. Basilar widely patent to its distal aspect. Superior cerebellar  arteries patent bilaterally. Both PCAs supplied via the basilar as well as small bilateral posterior communicating arteries. PCAs well perfused to their distal aspects without stenosis. No intracranial aneurysm or other vascular abnormality. IMPRESSION: Normal intracranial MRA. No large vessel occlusion, hemodynamically significant stenosis, or other acute vascular abnormality. Electronically Signed   By: Jeannine Boga M.D.   On: 12/24/2020 22:40   MR BRAIN WO CONTRAST  Result Date: 12/24/2020 CLINICAL DATA:  Neuro deficit, acute stroke suspected. EXAM: MRI HEAD WITHOUT CONTRAST TECHNIQUE: Multiplanar, multiecho pulse sequences of the brain and surrounding structures were obtained without intravenous  contrast. COMPARISON:  Same day head CT. FINDINGS: Brain: Acute infarct within the right hemi pons. Mild associated edema without substantial mass effect. Small acute infarct in the left frontal subcortical white matter (see series 2 and 250, image 29) without associated edema. Small remote lacunar infarcts in the right corona radiata and left periatrial white matter. No hydrocephalus. No acute hemorrhage. No mass lesion. No extra-axial fluid collection. Vascular: Major arterial flow voids are maintained at the skull base. Skull and upper cervical spine: Normal marrow signal. Sinuses/Orbits: Sinuses are clear.  Unremarkable orbits. IMPRESSION: 1. Acute infarct in the right hemipons. Additional small acute infarct in the left frontal subcortical white matter. 2. Mild associated edema in the right pons without substantial mass effect. 3. Small remote lacunar infarcts in the right corona radiata and left periatrial white matter. Findings discussed with Dr. Shelly Coss via telephone at 2:02 PM. Electronically Signed   By: Margaretha Sheffield MD   On: 12/24/2020 14:05   ECHOCARDIOGRAM COMPLETE  Result Date: 12/25/2020    ECHOCARDIOGRAM REPORT   Patient Name:   Adrienne Lane Date of Exam: 12/25/2020 Medical Rec #:   [id number]      Height:       68.0 in Accession #:    [id number]     Weight:       224.0 lb Date of Birth:  10/26/1966      BSA:          2.145 m Patient Age:    51 years       BP:           137/71 mmHg Patient Gender: F              HR:           81 bpm. Exam Location:  Inpatient Procedure: 2D Echo, Color Doppler and Cardiac Doppler Indications:    Stroke i163.96  History:        Patient has prior history of Echocardiogram examinations, most                 recent 06/16/2020. CHF, Arrythmias:Atrial Fibrillation; Risk                 Factors:Hypertension, Diabetes and Dyslipidemia.  Sonographer:    Raquel Sarna Senior RDCS Referring Phys: [place]  1. Since the last study on 06/16/20 LVEF remains mildly decreased with diffuse hypokinesis and LVEF 45-50%. RVEF is mildly decreased, RV is now less dilated and RVSP has improved from 50 to 30 mmHg.  2. Left ventricular ejection fraction, by estimation, is 45 to 50%. The left ventricle has mildly decreased function. The left ventricle demonstrates global hypokinesis. Left ventricular diastolic function could not be evaluated.  3. Right ventricular systolic function is mildly reduced. The right ventricular size is normal. There is normal pulmonary artery systolic pressure. The estimated right ventricular systolic pressure is 62.8 mmHg.  4. Left atrial size was mildly dilated.  5. The mitral valve is normal in structure. Mild mitral valve regurgitation. No evidence of mitral stenosis.  6. The aortic valve is normal in structure. Aortic valve regurgitation is not visualized. No aortic stenosis is present.  7. The inferior vena cava is normal in size with <50% respiratory variability, suggesting right atrial pressure of 8 mmHg. FINDINGS  Left Ventricle: Left ventricular ejection fraction, by estimation, is 45 to 50%. The left ventricle has mildly decreased function. The left ventricle demonstrates global hypokinesis. The left ventricular internal cavity size was  normal  in size. There is  no left ventricular hypertrophy. Left ventricular diastolic function could not be evaluated due to atrial fibrillation. Left ventricular diastolic function could not be evaluated. Right Ventricle: The right ventricular size is normal. No increase in right ventricular wall thickness. Right ventricular systolic function is mildly reduced. There is normal pulmonary artery systolic pressure. The tricuspid regurgitant velocity is 2.35 m/s, and with an assumed right atrial pressure of 8 mmHg, the estimated right ventricular systolic pressure is 45.8 mmHg. Left Atrium: Left atrial size was mildly dilated. Right Atrium: Right atrial size was normal in size. Pericardium: There is no evidence of pericardial effusion. Mitral Valve: The mitral valve is normal in structure. Mild mitral valve regurgitation. No evidence of mitral valve stenosis. Tricuspid Valve: The tricuspid valve is normal in structure. Tricuspid valve regurgitation is mild . No evidence of tricuspid stenosis. Aortic Valve: The aortic valve is normal in structure. Aortic valve regurgitation is not visualized. No aortic stenosis is present. Pulmonic Valve: The pulmonic valve was normal in structure. Pulmonic valve regurgitation is not visualized. No evidence of pulmonic stenosis. Aorta: The aortic root is normal in size and structure. Venous: The inferior vena cava is normal in size with less than 50% respiratory variability, suggesting right atrial pressure of 8 mmHg. IAS/Shunts: No atrial level shunt detected by color flow Doppler.  LEFT VENTRICLE PLAX 2D LVIDd:         4.80 cm LVIDs:         4.20 cm LV PW:         1.20 cm LV IVS:        0.70 cm LVOT diam:     2.30 cm LV SV:         54 LV SV Index:   25 LVOT Area:     4.15 cm  LV Volumes (MOD) LV vol d, MOD A2C: 84.7 ml LV vol d, MOD A4C: 99.4 ml LV vol s, MOD A2C: 60.2 ml LV vol s, MOD A4C: 65.7 ml LV SV MOD A2C:     24.5 ml LV SV MOD A4C:     99.4 ml LV SV MOD BP:      31.4 ml RIGHT  VENTRICLE RV S prime:     7.51 cm/s TAPSE (M-mode): 2.3 cm LEFT ATRIUM             Index       RIGHT ATRIUM           Index LA diam:        3.60 cm 1.68 cm/m  RA Area:     15.70 cm LA Vol (A2C):   83.1 ml 38.75 ml/m RA Volume:   41.60 ml  19.40 ml/m LA Vol (A4C):   62.1 ml 28.96 ml/m LA Biplane Vol: 71.2 ml 33.20 ml/m  AORTIC VALVE LVOT Vmax:   66.25 cm/s LVOT Vmean:  43.275 cm/s LVOT VTI:    0.129 m  AORTA Ao Root diam: 2.80 cm TRICUSPID VALVE TR Peak grad:   22.1 mmHg TR Vmax:        235.00 cm/s  SHUNTS Systemic VTI:  0.13 m Systemic Diam: 2.30 cm Ena Dawley MD Electronically signed by Ena Dawley MD Signature Date/Time: 12/25/2020/11:53:23 AM    Final    VAS US CAROTID  Result Date: 12/25/2020 Carotid Arterial Duplex Study Indications:   CVA and Left side weakness, left facial drrop, speech                disturbance.  Risk Factors:  Hypertension, Diabetes, no history of smoking. Other Factors: CKD4, CHF, AFIB, Obesity. Performing Technologist: Rogelia Rohrer  Examination Guidelines: A complete evaluation includes B-mode imaging, spectral Doppler, color Doppler, and power Doppler as needed of all accessible portions of each vessel. Bilateral testing is considered an integral part of a complete examination. Limited examinations for reoccurring indications may be performed as noted.  Right Carotid Findings: +----------+--------+--------+--------+------------------+--------+             PSV cm/s EDV cm/s Stenosis Plaque Description Comments  +----------+--------+--------+--------+------------------+--------+  CCA Prox   97       11                                             +----------+--------+--------+--------+------------------+--------+  CCA Distal 78       28                                             +----------+--------+--------+--------+------------------+--------+  ICA Prox   65       16       1-39%                                  +----------+--------+--------+--------+------------------+--------+  ICA Distal 109      25                                             +----------+--------+--------+--------+------------------+--------+  ECA        89       0                                              +----------+--------+--------+--------+------------------+--------+ +----------+--------+-------+----------------+-------------------+             PSV cm/s EDV cms Describe         Arm Pressure (mmHG)  +----------+--------+-------+----------------+-------------------+  Subclavian 138      0       Multiphasic, WNL                      +----------+--------+-------+----------------+-------------------+ +---------+--------+--+--------+--+---------+  Vertebral PSV cm/s 61 EDV cm/s 13 Antegrade  +---------+--------+--+--------+--+---------+  Left Carotid Findings: +----------+--------+--------+--------+------------------+--------+             PSV cm/s EDV cm/s Stenosis Plaque Description Comments  +----------+--------+--------+--------+------------------+--------+  CCA Prox   107      15                                             +----------+--------+--------+--------+------------------+--------+  CCA Distal 84       18                                             +----------+--------+--------+--------+------------------+--------+  ICA Prox   89       25       1-39%                                 +----------+--------+--------+--------+------------------+--------+  ICA Distal 93       28                                             +----------+--------+--------+--------+------------------+--------+  ECA        64       0                                              +----------+--------+--------+--------+------------------+--------+ +----------+--------+--------+----------------+-------------------+             PSV cm/s EDV cm/s Describe         Arm Pressure (mmHG)  +----------+--------+--------+----------------+-------------------+  Subclavian 91       11        Multiphasic, WNL                      +----------+--------+--------+----------------+-------------------+ +---------+--------+--+--------+--+---------+  Vertebral PSV cm/s 63 EDV cm/s 19 Antegrade  +---------+--------+--+--------+--+---------+   Summary: Right Carotid: Velocities in the right ICA are consistent with a 1-39% stenosis.                The extracranial vessels were near-normal with only minimal wall                thickening or plaque. Left Carotid: Velocities in the left ICA are consistent with a 1-39% stenosis.               The extracranial vessels were near-normal with only minimal wall               thickening or plaque. Vertebrals:  Bilateral vertebral arteries demonstrate antegrade flow. Subclavians: Normal flow hemodynamics were seen in bilateral subclavian              arteries. *See table(s) above for measurements and observations.     Preliminary     Microbiology: Recent Results (from the past 240 hour(s))  Resp Panel by RT-PCR (Flu A&B, Covid) Nasopharyngeal Swab     Status: None   Collection Time: 12/24/20  4:18 PM   Specimen: Nasopharyngeal Swab; Nasopharyngeal(NP) swabs in vial transport medium  Result Value Ref Range Status   SARS Coronavirus 2 by RT PCR NEGATIVE NEGATIVE Final    Comment: (NOTE) SARS-CoV-2 target nucleic acids are NOT DETECTED.  The SARS-CoV-2 RNA is generally detectable in upper respiratory specimens during the acute phase of infection. The lowest concentration of SARS-CoV-2 viral copies this assay can detect is 138 copies/mL. A negative result does not preclude SARS-Cov-2 infection and should not be used as the sole basis for treatment or other patient management decisions. A negative result may occur with  improper specimen collection/handling, submission of specimen other than nasopharyngeal swab, presence of viral mutation(s) within the areas targeted by this assay, and inadequate number of viral copies(<138 copies/mL). A negative result  must be combined with clinical observations, patient history, and epidemiological information. The expected result is Negative.  Fact Sheet for Patients:  EntrepreneurPulse.com.au  Fact Sheet for Healthcare Providers:  IncredibleEmployment.be  This test is no t yet approved or cleared by the [place] FDA and  has been authorized for detection and/or diagnosis of SARS-CoV-2 by FDA under an Emergency Use Authorization (EUA). This EUA will remain  in effect (meaning this test can be used) for the duration of the COVID-19 declaration under Section 564(b)(1) of the Act, 21 U.S.C.section 360bbb-3(b)(1), unless the authorization is terminated  or revoked sooner.       Influenza A by PCR NEGATIVE NEGATIVE Final   Influenza B by PCR NEGATIVE NEGATIVE Final    Comment: (NOTE) The Xpert Xpress SARS-CoV-2/FLU/RSV plus assay is intended as an aid in the diagnosis of influenza from Nasopharyngeal swab specimens and should not be used as a sole basis for treatment. Nasal washings and aspirates are unacceptable for Xpert Xpress SARS-CoV-2/FLU/RSV testing.  Fact Sheet for Patients: EntrepreneurPulse.com.au  Fact Sheet for Healthcare Providers: IncredibleEmployment.be  This test is not yet approved or cleared by the [place] FDA and has been authorized for detection and/or diagnosis of SARS-CoV-2 by FDA under an Emergency Use Authorization (EUA). This EUA will remain in effect (meaning this test can be used) for the duration of the COVID-19 declaration under Section 564(b)(1) of the Act, 21 U.S.C. section 360bbb-3(b)(1), unless the authorization is terminated or revoked.  Performed at [facility], [place] [address], [place], [place] [address]      Labs: Basic Metabolic Panel: Recent Labs  Lab 12/26/20 [phone] 12/27/20 0640 12/29/20 0603 01/01/21 0507  NA 140 139 139 142  K 2.7* 3.4* 3.4* 4.0  CL  102 104 106 109  CO2 24 23 24  21*  GLUCOSE 176* 163* 125* 98  BUN 34* 36* 31* 33*  CREATININE 1.97* 1.81* 1.77* 1.52*  CALCIUM 9.4 9.1 9.3 9.2  MG 2.3  --   --   --    Liver Function Tests: Recent Labs  Lab 12/29/20 0603  AST 20  ALT 17  ALKPHOS 75  BILITOT 0.8  PROT 6.6  ALBUMIN 2.9*   No results for input(s): LIPASE, AMYLASE in the last 168 hours. No results for input(s): AMMONIA in the last 168 hours. CBC: Recent Labs  Lab 12/29/20 0603  WBC 4.6  NEUTROABS 2.6  HGB 12.0  HCT 36.4  MCV 86.9  PLT 175   Cardiac Enzymes: No results for input(s): CKTOTAL, CKMB, CKMBINDEX, TROPONINI in the last 168 hours. BNP: BNP (last 3 results) No results for input(s): BNP in the last 8760 hours.  ProBNP (last 3 results) No results for input(s): PROBNP in the last 8760 hours.  CBG: Recent Labs  Lab 12/31/20 1643 12/31/20 2121 01/01/21 0614 01/01/21 1114 01/01/21 1621  GLUCAP 124* 128* 89 104* 115*       Signed:  Kayleen Memos, MD [facility] 01/01/2021, 5:02 PM

## 2020-12-27 NOTE — Progress Notes (Signed)
Transferred pt to 3F29. Pt calm GCS 15, NIH 2.

## 2020-12-27 NOTE — Discharge Instructions (Signed)
Physical Therapy After a Stroke After a stroke, some people experience physical changes or problems. Physical therapy may be prescribed to help you recover and overcome problems such as:  Inability to move (paralysis) or weakness, typically affecting one side of the body.  Trouble with balance.  Pain, a pins and needles sensation, or numbness in certain parts of the body. You may also have difficulty feeling touch, pressure, or changes in temperature.  Involuntary muscle tightening (spasticity).  Stiffness in muscles and joints.  Altered coordination and reflexes. What causes physical disability after a stroke? A stroke can damage parts of your brain that control your body's normal functions, including your ability to move and to keep your balance. The types of physical problems you have will depend on how severe the stroke was and where it was located in the brain. Weakness or paralysis may affect just your fingers and hands, a whole leg or arm, or an entire side of your body. What is physical therapy? Physical therapy involves using exercises, stretches, and activities to help you regain movement and independence after your stroke. Physical therapy may focus on one or more of the following:  Range of motion. This can help with movement and reduce muscle stiffness.  Balance. This helps to lower your risk of falling.  Position changes or transfers, such as moving from sitting to standing or from a chair to a bed.  Coordination, such as getting an object from a shelf.  Muscle strength. Muscles may be strengthened with weights or by repeating certain motions.  Functional mobility. This may include stair training or learning how to use a wheelchair, walker, or cane.  Walking (gait training).  Activities of daily living, such as getting out of the car or buttoning a shirt. Why is physical therapy important? It is important to do exercises and follow your rehabilitation plan as  told by your physical therapist. Physical therapy can:  Help you regain independence.  Prevent injury from falls by building strength and balance.  Lower your risk of blood clots.  Lower your risk of skin sores (pressure injuries).  Increase physical activity and exercise. This may help lower your risk for another stroke.  Help reduce pain. When will therapy start and where will I have therapy? Your health care provider will decide when it is best for you to start therapy. In some cases, people start rehabilitation, including physical therapy, as soon as they are medically stable, which may be 24-48 hours after a stroke. Rehabilitation can take place in a few different places, based on your needs. It may take place in:  The hospital or an in-patient rehabilitation hospital.  An outpatient rehabilitation facility.  A long-term care facility.  A community rehabilitation clinic.  Your home. What are assistive devices? Assistive devices are tools to help you move, maintain balance, and manage daily tasks while recovering from a stroke. Your physical therapist may recommend and help you learn to use:  Equipment to help you move, such as wheelchairs, canes, or walkers.  Braces or splints to keep your arms, hands, legs, or feet in a comfortable and safe position.  Bathtub benches or grab bars to keep you safe in the bathroom.  Special utensils, bowls, and plates that allow you to eat with one hand. It is important to use these devices as told by your health care provider. Summary  After a stroke, some people may experience physical disabilities, such as weakness or paralysis, pain, or balance problems.  Physical  therapy involves exercises, stretches, and activities that help to improve your ability to move and to handle daily tasks.  Physical therapy exercises focus on restoring range of motion, balance, coordination, muscle strength, and the ability to move  (mobility).  Physical therapy can help you regain independence, prevent falls, and allow you to live a more active lifestyle after a stroke. This information is not intended to replace advice given to you by your health care provider. Make sure you discuss any questions you have with your health care provider. Document Revised: 03/29/2019 Document Reviewed: 03/14/2017 Elsevier Patient Education  2020 [place] After Stroke A stroke causes damage to the brain cells, which can affect your ability to walk, talk, and even eat. The impact of a stroke is different for everyone, and so is recovery. A good nutrition plan is important for your recovery. It can also lower your risk of another stroke. If you have difficulty chewing and swallowing your food, a dietitian or your stroke care team can help so that you can enjoy eating healthy foods. What are tips for following this plan?  Reading food labels  Choose foods that have less than 300 milligrams (mg) of sodium per serving. Limit your sodium intake to less than 1,500 mg per day.  Avoid foods that have saturated fat and trans fat.  Choose foods that are low in cholesterol. Limit the amount of cholesterol you eat each day to less than 200 mg.  Choose foods that are high in fiber. Eat 20-30 grams (g) of fiber each day.  Avoid foods with added sugar. Check the food label for ingredients such as sugar, corn syrup, honey, fructose, molasses, and cane juice. Shopping  At the grocery store, buy most of your food from areas near the walls of the store. This includes: ? Fresh fruits and vegetables. ? Dry grains, beans, nuts, and seeds. ? Fresh seafood, poultry, lean meats, and eggs. ? Low-fat dairy products.  Buy whole ingredients instead of prepackaged foods.  Buy fresh, in-season fruits and vegetables from local farmers markets.  Buy frozen fruits and vegetables in resealable bags. Cooking  Prepare foods with very little  salt. Use herbs or salt-free spices instead.  Cook with heart-healthy oils, such as olive, avocado, canola, soybean, or sunflower oil.  Avoid frying foods. Bake, grill, or broil foods instead.  Remove visible fat and skin from meat and poultry before eating.  Modify food textures as told by your health care provider. Meal planning  Eat a wide variety of colorful fruits and vegetables. Make sure one-half of your plate is filled with fruits and vegetables at each meal.  Eat fruits and vegetables that are high in potassium, such as: ? Apples, bananas, oranges, and melon. ? Sweet potatoes, spinach, zucchini, and tomatoes.  Eat fish that contain heart-healthy fats (omega-3 fats) at least twice a week. These include salmon, tuna, mackerel, and sardines.  Eat plant foods that are high in omega-3 fats, such as flaxseeds and walnuts. Add these to cereals, yogurt, or pasta dishes.  Eat several servings of high-fiber foods each day, such as fruits, vegetables, whole grains, and beans.  Do not put salt at the table for meals.  When eating out at restaurants: ? Ask the server about low-salt or salt-free food options. ? Avoid fried foods. Look for menu items that are grilled, steamed, broiled, or roasted. ? Ask if your food can be prepared without butter. ? Ask for condiments, such as salad dressings,  gravy, or sauces to be served on the side.  If you have difficulty swallowing: ? Choose foods that are softer and easier to chew and swallow. ? Cut foods into small pieces and chew well before swallowing. ? Thicken liquids as told by your health care provider or dietitian. ? Let your health care provider know if your condition does not improve over time. You may need to work with a speech therapist to re-train the muscles that are used for eating. General recommendations  Involve your family and friends in your recovery, if possible. It may be helpful to have a slower meal time and to plan meals  that include foods everyone in the family can eat.  Brush your teeth with fluoride toothpaste twice a day, and floss once a day. Keeping a clean mouth can help you swallow and can also help your appetite.  Drink enough water each day to keep your urine pale yellow. If needed, set reminders or ask your family to help you remember to drink water.  Limit alcohol intake to no more than 1 drink a day for nonpregnant women and 2 drinks a day for men. One drink equals 12 oz of beer, 5 oz of wine, or 1 oz of hard liquor. Summary  Following this eating plan can help in your stroke recovery and can decrease your risk for another stroke.  Let your health care provider know if you have problems with swallowing. You may need to work with a speech therapist. This information is not intended to replace advice given to you by your health care provider. Make sure you discuss any questions you have with your health care provider. Document Revised: 03/29/2019 Document Reviewed: 02/13/2018 Elsevier Patient Education  2020 [place] After a Stroke After a stroke, you may have various problems with thinking (cognitive disability). The types of problems you have will depend on how severe the stroke was and where it was located in the brain. Problems may include:  Problems with short-term memory.  Trouble paying attention.  Trouble communicating or understanding language (aphasia).  A drop in mental ability that may interfere with daily life (dementia).  Trouble with problem-solving and information processing.  Problems with reading, writing, or math.  Problems with your ability to plan and to perform activities in sequence (executive function). These problems can feel overwhelming. However, with rehabilitation and time to heal, many people have improvement in their symptoms. What causes cognitive disability? A stroke happens when blood cannot flow to certain areas of the  brain. When this happens, brain cells die in the affected areas because they cannot get oxygen and nutrients from the blood. Cognitive disability is caused by the death of cells in the areas of the brain that control thinking. What is cognitive rehabilitation? Cognitive rehabilitation is a program to help you improve your thinking skills after a stroke. Rehabilitation cannot completely reverse the effects of a stroke, but it can help you with memory, problem-solving, and communication skills. Therapy focuses on:  Improving brain function. This may involve activities such as learning to break down tasks into simple steps.  Helping you learn ways to cope with thinking problems. For example, you might learn memory tricks or do activities that stimulate memory, such as naming objects or describing pictures. Cognitive rehabilitation may include:  Speech-language therapy to help you understand and use language to communicate.  Occupational therapy to help you perform daily activities.  Music therapy to help relieve stress, anxiety, and  depression. This may involve listening to music, singing, or playing instruments.  Physical therapy to help improve your ability to move and perform actions that involve the muscles (motor functions). When will therapy start and where will I have therapy? Your health care provider will decide when it is best for you to start therapy. In some cases, people start rehabilitation as soon as their health is stable, which may be 24-48 hours after the stroke. Rehabilitation can take place in a few different places, based on your needs. It may take place in:  The hospital or an in-patient rehabilitation hospital.  An outpatient rehabilitation facility.  A long-term care facility.  A community rehabilitation clinic.  Your home. What are some tools to help after a stroke? There are a number of tools and apps that you can use on your smartphone, personal computer, or  tablet to help improve brain function. Some of these apps include:  Calendar reminders or alarm apps to help with memory.  Note-taking or sketch pad apps to help with memory or communication.  Text-to-speech apps that allow you to listen to what you are reading, which helps your ability to understanding text.  Picture dictionary or picture message apps to help with communication.  E-readers. These can highlight text as it is read aloud, which helps with listening and reading skills. How can my friends or family help during my rehabilitation? During your recovery, it is important that your friends and family members help you work toward more independence. Your caregivers should speak with your health care providers to learn how they can best help you during recovery. This may include working on speech-language or memory exercises at home, or helping with daily tasks and errands. If you have cognitive disability, you may be at risk for injury or accidents at home, such as forgetting to turn off the stove. Friends and family members can help ensure home safety by taking steps such as getting appliances with automatic shut-off features or storing dangerous objects in a secure place. What else should I know about cognitive rehabilitation after a stroke? Having trouble with memory and problem-solving can make you feel alone. You may also have mood changes, anxiety, or depression after a stroke. It is important to:  Stay connected with others through social groups, online support groups, or your community.  Talk to your friends, family, and caregivers about any emotional problems you are having.  Go to one-on-one or group therapy as suggested by your health care provider.  Stay physically active and exercise as often as suggested by your health care provider. Summary  After a stroke, some people have problems with thinking that affect attention, memory, language, communication, and  problem-solving.  Cognitive rehabilitation is a program to help you regain brain function and learn skills to cope with thinking problems.  Rehabilitation cannot completely reverse the effects of a stroke, but it can help to improve quality of life.  Cognitive rehabilitation may include speech-language therapy, occupational therapy, music therapy, and physical therapy. This information is not intended to replace advice given to you by your health care provider. Make sure you discuss any questions you have with your health care provider. Document Revised: 03/28/2019 Document Reviewed: 03/11/2017 Elsevier Patient Education  [place] on my medicine - Pradaxa (dabigatran)  This medication education was reviewed with me or my healthcare representative as part of my discharge preparation.    Why was Pradaxa prescribed for you? Pradaxa was prescribed for you to reduce the  risk of forming blood clots that cause a stroke if you have a medical condition called atrial fibrillation (a type of irregular heartbeat).    What do you Need to know about PradAXa? Take your Pradaxa TWICE DAILY - one capsule in the morning and one tablet in the evening with or without food.  It would be best to take the doses about the same time each day.  The capsules should not be broken, chewed or opened - they must be swallowed whole.  Do not store Pradaxa in other medication containers - once the bottle is opened the Pradaxa should be used within FOUR months; throw away any capsules that haven't been by that time.  Take Pradaxa exactly as prescribed by your doctor.  DO NOT stop taking Pradaxa without talking to the doctor who prescribed the medication.  Stopping without other stroke prevention medication to take the place of Pradaxa may increase your risk of developing a clot that causes a stroke.  Refill your prescription before you run out.  After discharge, you should have regular check-up  appointments with your healthcare provider that is prescribing your Pradaxa.  In the future your dose may need to be changed if your kidney function or weight changes by a significant amount.  What do you do if you miss a dose? If you miss a dose, take it as soon as you remember on the same day.  If your next dose is less than 6 hours away, skip the missed dose.  Do not take two doses of PRADAXA at the same time.  Important Safety Information A possible side effect of Pradaxa is bleeding. You should call your healthcare provider right away if you experience any of the following: ? Bleeding from an injury or your nose that does not stop. ? Unusual colored urine (red or dark brown) or unusual colored stools (red or black). ? Unusual bruising for unknown reasons. ? A serious fall or if you hit your head (even if there is no bleeding).  Some medicines may interact with Pradaxa and might increase your risk of bleeding or clotting while on Pradaxa. To help avoid this, consult your healthcare provider or pharmacist prior to using any new prescription or non-prescription medications, including herbals, vitamins, non-steroidal anti-inflammatory drugs (NSAIDs) and supplements.  This website has more information on Pradaxa (dabigatran): [email or web address]://www.pradaxa.com

## 2020-12-27 NOTE — Progress Notes (Addendum)
STROKE TEAM PROGRESS NOTE    INTERVAL HISTORY No acute overnight events.  Patient sitting comfortably in chair.  She states that she worked with physical therapy this morning and it went better than yesterday, although more time to do steps.  Notes improvement in her left arm and hand from yesterday.  Denies any new complaint but asked about floaters in her left eye and suggested to see ophthalmologist as an outpatient.  Encouraged to work with physical therapy for improvement and patient shows good understanding.  Blood pressure well controlled.  Neurological exam improving.  She is stable from stroke team point of view.  Vitals:   12/26/20 1948 12/26/20 2353 12/27/20 0500 12/27/20 1145  BP: (!) 152/89 (!) 147/71 (!) 151/99 (!) 135/91  Pulse: 73 60 (!) 55 66  Resp: 18 18 16 16   Temp: 98.1 F (36.7 C) 97.9 F (36.6 C) 97.8 F (36.6 C) 97.9 F (36.6 C)  TempSrc: Oral Oral Oral Oral  SpO2: 100% 99% 96% 99%  Weight:      Height:       CBC:  Recent Labs  Lab 12/24/20 1034  WBC 5.4  NEUTROABS 4.0  HGB 13.0  HCT 39.8  MCV 86.9  PLT 097   Basic Metabolic Panel:  Recent Labs  Lab 12/26/20 0658 12/27/20 0640  NA 140 139  K 2.7* 3.4*  CL 102 104  CO2 24 23  GLUCOSE 176* 163*  BUN 34* 36*  CREATININE 1.97* 1.81*  CALCIUM 9.4 9.1  MG 2.3  --    Lipid Panel:  Recent Labs  Lab 12/25/20 0202  CHOL 260*  TRIG 141  HDL 59  CHOLHDL 4.4  VLDL 28  LDLCALC 173*   HgbA1c:  Recent Labs  Lab 12/25/20 0202  HGBA1C 8.0*   Urine Drug Screen:  Recent Labs  Lab 12/24/20 1859  LABOPIA NONE DETECTED  COCAINSCRNUR NONE DETECTED  LABBENZ NONE DETECTED  AMPHETMU NONE DETECTED  THCU NONE DETECTED  LABBARB NONE DETECTED     IMAGING past 24 hours  CT Head  IMPRESSION: 1. Small area of hypoattenuation in the left periatrial white matter could represent chronic microvascular ischemic disease or age indeterminate lacunar infarct. Additional age indeterminate  lacunar infarct within the right corona radiata, favored remote. If there is concern for acute infarct, MRI could further evaluate. 2. No acute hemorrhage. 3. Punctate foreign body in the subcutaneous high posterior left scalp, likely remote given no scalp swelling.  MR Brain wo  IMPRESSION: 1. Acute infarct in the right hemipons. Additional small acute infarct in the left frontal subcortical white matter. 2. Mild associated edema in the right pons without substantial mass effect. 3. Small remote lacunar infarcts in the right corona radiata and left periatrial white matter.  MR Angio Head  IMPRESSION: Normal intracranial MRA. No large vessel occlusion, hemodynamically significant stenosis, or other acute vascular abnormality.  Echocardiogram  IMPRESSIONS    1. Since the last study on 06/16/20 LVEF remains mildly decreased with  diffuse hypokinesis and LVEF 45-50%. RVEF is mildly decreased, RV is now  less dilated and RVSP has improved from 50 to 30 mmHg.   2. Left ventricular ejection fraction, by estimation, is 45 to 50%. The  left ventricle has mildly decreased function. The left ventricle  demonstrates global hypokinesis. Left ventricular diastolic function could  not be evaluated.   3. Right ventricular systolic function is mildly reduced. The right  ventricular size is normal. There is normal pulmonary artery systolic  pressure.  The estimated right ventricular systolic pressure is 54.6 mmHg.   4. Left atrial size was mildly dilated.   5. The mitral valve is normal in structure. Mild mitral valve  regurgitation. No evidence of mitral stenosis.   6. The aortic valve is normal in structure. Aortic valve regurgitation is  not visualized. No aortic stenosis is present.   7. The inferior vena cava is normal in size with <50% respiratory  variability, suggesting right atrial pressure of 8 mmHg.     PHYSICAL EXAM  General: Obese, middle age African-American female  sitting comfortably in bed, NAD CV: RRR, S1, S2 heard, No m/r/g Pulmonary: Clear to ausculation bilaterally. No wheezes, no rales Abdomen: Soft, no rigidity, no abdominal tenderness, BS+ Psych: Pleasant mood and affect Neurological:  Mental Status: Alert, oriented, thought content appropriate.  Speech fluent with mild dysarthria which is improving.  Able to follow 3 step commands without difficulty. Cranial Nerves: II:  Visual fields grossly normal, pupils equal, round, reactive to light and accommodation III,IV, VI: ptosis not present, extra-ocular motions intact bilaterally V,VII: smile asymmetric with left facial droop, facial light touch sensation normal bilaterally VIII: hearing normal bilaterally IX,X: uvula rises symmetrically XI: bilateral shoulder shrug XII: midline tongue extension without atrophy or fasciculations  Motor: Right : Upper extremity   5/5    Left:     Upper extremity   3/5 with drift  Lower extremity   5/5     Lower extremity   4/5 , Able to hold both lower extremities for 5 sec but LLE lower than RLE.  Tone and bulk:normal tone throughout; no atrophy noted Sensory: Pinprick and light touch intact throughout, bilaterally Plantars: Right: downgoing   Left: downgoing Cerebellar: normal finger-to-nose,  normal heel-to-shin test Gait: Deferred   NIH stroke scale 3. Premorbid modified Rankin's score 0  ASSESSMENT/PLAN [name] is a 55 y.o. 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   Stroke - right pontine lacunar infarct likely from small vessel disease, the patient has a history of atrial fibrillation and was compliant with her Eliquis  CT Head: chronic microvascular ischemic disease or age or indeterminate lacunar infarct, Additional age  indeterminate lacunar, infarct within the right corona radiata, favored remote  MRI : Acute infarct in the right hemipons. Additional small acute infarct in the left frontal subcortical white matter. Small remote lacunar infarcts in the right corona radiata and left periatrial white matter.  MR angio Head - neg  Carotid US unremarkable 2D Echo: EF 45 to 50%.  LDL 173  HgbA1c 8.0  VTE prophylaxis - On pradaxa  Eliquis (apixaban) daily and ASA 81 prior to admission, now on ASA 81 and Pradaxa 150 mg BID. Continue on discharge  Therapy recommendations:  CIR  Disposition:  Pending  Hypertension  Home meds:  Norvasc, Coreg, Hydralazine   Stable . Long-term BP goal normotensive  Hyperlipidemia  Home meds:  Lipitor 40 mg, increased to Lipitor 80 mg in hospital  LDL 173, goal < 70  Continue statin at discharge  Diabetes type II UncControlled  Home meds:  Lantus  HgbA1c 8.0, goal < 7.0  CBGs  SSI  Close PCP follow-up for better DM control  Other Stroke Risk Factors  Obesity, BMI >/= 30 associated with increased stroke risk,  recommend weight loss, diet and exercise as appropriate   Coronary artery disease  Obstructive sleep apnea - evaluating by sleepsmart trial  Congestive heart failure  Honor Junes, MD PGY1 Resident  Hospital day # 3 ATTENDING NOTE: I reviewed above note and agree with the assessment and plan. Pt was seen and examined.   Patient sitting in chair, no acute event overnight, no complaint.  Still has left facial droop and L left hemiparesis, working with PT OT.  Pending CIR.  Currently on aspirin 81 and Pradaxa.  Increased Lipitor to 80 mg.  Continue on discharge.  Neurology will sign off. Please call with questions. Pt will follow up with stroke clinic NP at [facility] in about 4 weeks. Thanks for the consult.   Rosalin Hawking, MD PhD Stroke Neurology 12/27/2020 6:32 PM      To contact Stroke Continuity provider, please refer to  [email or web address] After hours, contact General Neurology

## 2020-12-27 NOTE — Progress Notes (Signed)
Patient arrived via bed to 4MW05 with nurse and tech. Patient adjusted to room and in good spirits. Sanda Linger, LPN

## 2020-12-27 NOTE — Progress Notes (Signed)
Physical Therapy Treatment Patient Details Name: Adrienne Lane MRN: [id number] DOB: 1966-12-13 Today's Date: 12/27/2020    History of Present Illness 55yo female c/o L sided weakness, facial droop with slurred speech, and gait difficulty. MRI shows R pontine infarct, small infarct in L frontal subcortical white matter, and small remote lacunar infarcts in the R corona radiata and L periatrial white matter. PMH A-fib with RVR, CHF, CKD, DM, obesity, HTN, cardiomyopathy    PT Comments    The pt was able to demo good progress with mobility and PT goals at this time. She remains highly motivated, and was able to complete improved distance of hallway ambulation and exercises including lateral stepping, standing hip abduction, and sit-stand/swuats to recliner. The pt continues to present with decreased strength and control of LLE, especially with fatigue and will continue to benefit from skilled PT to address these deficits. Continue to recommend CIR.     Follow Up Recommendations  CIR;Supervision for mobility/OOB     Equipment Recommendations  Rolling walker with 5" wheels;3in1 (PT);Wheelchair (measurements PT);Wheelchair cushion (measurements PT)    Recommendations for Other Services       Precautions / Restrictions Precautions Precautions: Fall Precaution Comments: L sided weakness Restrictions Weight Bearing Restrictions: No    Mobility  Bed Mobility Overal bed mobility: Needs Assistance             General bed mobility comments: pt received and left in recliner  Transfers Overall transfer level: Needs assistance Equipment used: Rolling walker (2 wheeled) Transfers: Sit to/from Stand Sit to Stand: Min assist;Min guard         General transfer comment: pt requesting minA to boost up on L-side for initial stand, able to practice sit-stand and squat to recliner without assist  Ambulation/Gait Ambulation/Gait assistance: Min guard Gait Distance (Feet): 150 Feet (with  standing rest breaks and transition to lateral walking at times) Assistive device: Rolling walker (2 wheeled) Gait Pattern/deviations: Step-through pattern;Decreased step length - right;Decreased step length - left;Decreased stride length;Narrow base of support;Trunk flexed;Decreased dorsiflexion - left Gait velocity: 0.22 m/s Gait velocity interpretation: <1.31 ft/sec, indicative of household ambulator General Gait Details: slow but steady with RW, cues for increased LLE clearance. increased LLE deficits with fatigue    Modified Rankin (Stroke Patients Only) Modified Rankin (Stroke Patients Only) Pre-Morbid Rankin Score: No symptoms Modified Rankin: Moderately severe disability     Balance Overall balance assessment: Needs assistance Sitting-balance support: No upper extremity supported Sitting balance-Leahy Scale: Good Sitting balance - Comments: statically   Standing balance support: No upper extremity supported;During functional activity Standing balance-Leahy Scale: Fair Standing balance comment: able to perform static and dynamic balance without UE support, benefits from RW for gait                            Cognition Arousal/Alertness: Awake/alert Behavior During Therapy: WFL for tasks assessed/performed Overall Cognitive Status: Within Functional Limits for tasks assessed                                 General Comments: pt pleasant and agreeable to all education, able to verbalize needs, and demo good safety awareness      Exercises Other Exercises Other Exercises: lateral walking at rail in hallway, x3x10 ft each direction Other Exercises: standing hip abduction 2 x 10 each leg Other Exercises: sit-stand and mini squats to recliner x 15  with minG and verbal cues for technique    General Comments General comments (skin integrity, edema, etc.): VSS      Pertinent Vitals/Pain Pain Assessment: No/denies pain           PT Goals  (current goals can now be found in the care plan section) Acute Rehab PT Goals Patient Stated Goal: go to rehab PT Goal Formulation: With patient Time For Goal Achievement: 01/08/21 Potential to Achieve Goals: Good Progress towards PT goals: Progressing toward goals    Frequency    Min 4X/week      PT Plan Current plan remains appropriate       AM-PAC PT "6 Clicks" Mobility   Outcome Measure  Help needed turning from your back to your side while in a flat bed without using bedrails?: A Little Help needed moving from lying on your back to sitting on the side of a flat bed without using bedrails?: A Little Help needed moving to and from a bed to a chair (including a wheelchair)?: A Little Help needed standing up from a chair using your arms (e.g., wheelchair or bedside chair)?: A Little Help needed to walk in hospital room?: A Little Help needed climbing 3-5 steps with a railing? : A Lot 6 Click Score: 17    End of Session Equipment Utilized During Treatment: Gait belt Activity Tolerance: Patient tolerated treatment well Patient left: in chair;with call bell/phone within reach;with chair alarm set Nurse Communication: Mobility status PT Visit Diagnosis: Unsteadiness on feet (R26.81);Difficulty in walking, not elsewhere classified (R26.2);Hemiplegia and hemiparesis;Muscle weakness (generalized) (M62.81);Other symptoms and signs involving the nervous system (R29.898);Other abnormalities of gait and mobility (R26.89) Hemiplegia - Right/Left: Left Hemiplegia - dominant/non-dominant: Non-dominant Hemiplegia - caused by: Cerebral infarction     Time: 4235-3614 PT Time Calculation (min) (ACUTE ONLY): 29 min  Charges:  $Gait Training: 8-22 mins $Therapeutic Exercise: 8-22 mins                     Karma Ganja, PT, DPT   Acute Rehabilitation Department Pager #: [phone]   Otho Bellows 12/27/2020, 12:04 PM

## 2020-12-27 NOTE — H&P (Signed)
Physical Medicine and Rehabilitation Admission H&P        Chief Complaint  Patient presents with  . Stroke Symptoms  : HPI: Adrienne Lane is a 55 year old right-handed female with history of atrial fibrillation RVR maintained on Eliquis, diastolic congestive heart failure, CKD stage III, diabetes mellitus, obesity with BMI 34.06, hypertension, cardiomyopathy.  Per chart review lives with spouse and also mother-in-law and son.  Mobile home 5 steps to entry.  Reportedly independent prior to admission.  Presented 12/24/2020 with left-sided weakness facial droop and slurred speech.  CT/MRI showed acute infarct right hemipons as well as additional small acute infarct left frontal subcortical white matter.  Small remote lacunar infarct in the right corona radiata and left periatrial white matter.  Patient did not receive TPA.  MRA of the head negative.  Echocardiogram with ejection fraction of 45 to 50% without thrombus.  Admission chemistries unremarkable except potassium 3.3 glucose 305 BUN 32 creatinine 2.12 urine drug screen negative.  Neurology follow-up patient's Eliquis was changed to Pradaxa for CVA prophylaxis as well as the addition of low-dose aspirin..  Maintain on a regular diet.  Due to patient's left-sided weakness facial droop slurred speech and decreased functional mobility she was admitted for a comprehensive rehab program.   Review of Systems  Constitutional: Negative for chills and fever.  HENT: Negative for hearing loss.   Eyes: Negative for blurred vision and double vision.  Respiratory: Negative for cough and shortness of breath.   Cardiovascular: Positive for palpitations and leg swelling.  Gastrointestinal: Positive for constipation. Negative for heartburn, nausea and vomiting.  Genitourinary: Negative for dysuria, flank pain and hematuria.  Musculoskeletal: Positive for joint pain and myalgias.  Skin: Negative for rash.  Neurological: Positive for speech change and  weakness.  All other systems reviewed and are negative.       Past Medical History:  Diagnosis Date  . Atrial fibrillation with RVR ([facility])    . [place] 01/2020  . CHF (congestive heart failure) ([facility]) 10/20/2019  . CKD (chronic kidney disease), stage IV ([facility])    . Diabetes mellitus without complication ([facility])    . DOE (dyspnea on exertion)      walking upstairs or up hill resolves in one minute  . Fibroids    . History of kidney stones    . History of recent blood transfusion 02/26/2020  . Hypertension    . Iron deficiency anemia    . Non-ischemic cardiomyopathy ([facility])      tachycardia induced  . Obese    . Peripheral edema    . Premature ventricular contractions (PVCs) ([facility])    . Umbilical hernia    . Wears glasses           Past Surgical History:  Procedure Laterality Date  . CHOLECYSTECTOMY      . CYSTOSCOPY W/ URETERAL STENT PLACEMENT Bilateral 05/24/2020    Procedure: CYSTOSCOPY WITH RETROGRADE PYELOGRAM/URETERAL STENT PLACEMENT;  Surgeon: Robley Fries, MD;  Location: [facility];  Service: Urology;  Laterality: Bilateral;  . CYSTOSCOPY/RETROGRADE/URETEROSCOPY Bilateral 04/03/2020    Procedure: CYSTOSCOPY/RETROGRADE/URETEROSCOPY;  Surgeon: Ardis Hughs, MD;  Location: [facility];  Service: Urology;  Laterality: Bilateral;  . HYSTERECTOMY ABDOMINAL WITH SALPINGO-OOPHORECTOMY   07/21/2020    Procedure: HYSTERECTOMY ABDOMINAL WITH BILATERAL SALPINGO-OOPHORECTOMY;  Surgeon: Sanjuana Kava, MD;  Location: [facility];  Service: Gynecology;;  . IR FLUORO GUIDE CV LINE RIGHT   02/21/2020  . IR US GUIDE VASC ACCESS RIGHT   02/21/2020  .  NEPHROLITHOTOMY Left 02/14/2020    Procedure: NEPHROLITHOTOMY PERCUTANEOUS/ SURGEON ACCESS/ LEFT PERCUTANEOUS NEPHROSTOMY TUBE PLACEMENT;  Surgeon: Ardis Hughs, MD;  [facility];  Service: Urology;  Laterality: Left;  . NEPHROLITHOTOMY Left 02/21/2020    Procedure: NEPHROLITHOTOMY PERCUTANEOUS SECOND LOOK;  Surgeon: Ardis Hughs, MD;  Location:  [facility];  Service: Urology;  Laterality: Left;  . NEPHROLITHOTOMY Left 02/26/2020    Procedure: NEPHROLITHOTOMY PERCUTANEOUS;  Surgeon: Ceasar Mons, MD;  Location[facility];  Service: Urology;  Laterality: Left;  NEED 150 MIN  . NEPHROLITHOTOMY Right 03/24/2020    Procedure: NEPHROLITHOTOMY PERCUTANEOUS WITH ACCESS LEFT STENT REMOVAL;  Surgeon: Ardis Hughs, MD;  [facility];  Service: Urology;  Laterality: Right;  . RIGHT HEART CATH N/A 11/09/2019    Procedure: RIGHT HEART CATH;  Surgeon: Jolaine Artist, MD;  Location: [facility];  Service: Cardiovascular;  Laterality: N/A;  . WISDOM TOOTH EXTRACTION             Family History  Problem Relation Age of Onset  . Atrial fibrillation Mother    . Hypertension Mother    . Stroke Mother 68  . Diabetes Mother    . Diabetes Father    . Hypertension Father    . Diabetes Sister    . Hypertension Sister    . Diabetes Brother    . Hypertension Brother    . Diabetes Brother    . Heart attack Brother    . Stroke Maternal Grandmother 61    Social History:  reports that she has never smoked. She has never used smokeless tobacco. She reports that she does not drink alcohol and does not use drugs. Allergies:       Allergies  Allergen Reactions  . Adhesive [Tape]        Tears skin, can tolerate paper tape          Medications Prior to Admission  Medication Sig Dispense Refill  . amLODipine (NORVASC) 5 MG tablet Take 1 tablet (5 mg total) by mouth daily. 30 tablet 6  . carvedilol (COREG) 25 MG tablet Take 1 tablet (25 mg total) by mouth 2 (two) times daily with a meal. 180 tablet 3  . dapagliflozin propanediol (FARXIGA) 10 MG TABS tablet Take 1 tablet (10 mg total) by mouth daily before breakfast. 30 tablet 6  . ELIQUIS 5 MG TABS tablet TAKE 1 TABLET (5 MG TOTAL) BY MOUTH 2 (TWO) TIMES DAILY. 60 tablet 6  . hydrALAZINE (APRESOLINE) 100 MG tablet TAKE 1 TABLET (100 MG TOTAL) BY MOUTH EVERY 8 (EIGHT) HOURS. 90  tablet 6  . insulin glargine (LANTUS) 100 UNIT/ML injection Inject 0.2 mLs (20 Units total) into the skin every morning. 10 mL 2  . isosorbide mononitrate (IMDUR) 60 MG 24 hr tablet TAKE 1 TABLET (60 MG TOTAL) BY MOUTH DAILY. 30 tablet 6  . mexiletine (MEXITIL) 150 MG capsule TAKE 2 CAPSULES (300 MG TOTAL) BY MOUTH EVERY 12 (TWELVE) HOURS. 120 capsule 6  . Multiple Vitamins-Minerals (WOMENS MULTI PO) Take 1 tablet by mouth daily.      . potassium chloride (KLOR-CON) 10 MEQ tablet Take 4 tablets (40 mEq total) by mouth daily. (Patient taking differently: Take 20 mEq by mouth 2 (two) times daily.) 120 tablet 6  . torsemide (DEMADEX) 20 MG tablet TAKE 2 TABLETS (40 MG TOTAL) BY MOUTH 2 (TWO) TIMES DAILY. 120 tablet 6  . glucose blood test strip Use as instructed 100 each 12  .  Lancets (ONETOUCH ULTRASOFT) lancets Check CBG twice a day 60 each 5  . TRUEPLUS INSULIN SYRINGE 30G X 5/16" 0.5 ML MISC USE TO INJECT 12 UNITS AT BEDTIME. 100 each 6      Drug Regimen Review Drug regimen was reviewed and remains appropriate with no significant issues identified   Home: Home Living Family/patient expects to be discharged to:: Private residence Living Arrangements: Spouse/significant other,Other (Comment) (also mother in law, and son) Available Help at Discharge: Family,Available 24 hours/day Type of Home: Mobile home Home Access: Stairs to enter Entrance Stairs-Number of Steps: 5 Entrance Stairs-Rails: Can reach both Home Layout: One level Bathroom Shower/Tub: Walk-in shower (two steps up to get into shower) Biochemist, clinical: Standard Home Equipment: None 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 History: Prior Function Level of Independence: Independent   Functional Status:  Mobility: Bed Mobility Overal bed mobility: Needs Assistance Bed Mobility: Supine to Sit,Sit to Supine Supine to sit: HOB elevated,Modified independent (Device/Increase time) Sit to supine:  Modified independent (Device/Increase time) General bed mobility comments: increased time and effort, pt hooked LLE to return BLE to bed Transfers Overall transfer level: Needs assistance Equipment used: Rolling walker (2 wheeled) Transfer via Lift Equipment: Stedy Transfers: Sit to/from [employer] to Stand: Min assist Stand pivot transfers: Total assist General transfer comment: minA with x2 attempts to progress into standing, pt utilizing rock to gain momentum to powerup into standing Ambulation/Gait General Gait Details: unable   ADL: ADL Overall ADL's : Needs assistance/impaired Eating/Feeding: Set up,Sitting Grooming: Set up,Sitting Upper Body Bathing: Minimal assistance,Sitting Lower Body Bathing: Minimal assistance,Sit to/from stand Upper Body Dressing : Minimal assistance,Sitting Lower Body Dressing: Minimal assistance,Sit to/from stand Toilet Transfer: Minimal Warden/ranger Details (indicate cue type and reason): short distances due to decrease endurance in LLE Toileting- Clothing Manipulation and Hygiene: Minimal assistance,Sit to/from stand Functional mobility during ADLs: Minimal assistance,Rolling walker General ADL Comments: pt with decreased activity tolerance, decreased endurance, Lsided weakness and decreased coordination   Cognition: Cognition Overall Cognitive Status: Within Functional Limits for tasks assessed Orientation Level: Oriented X4 Cognition Arousal/Alertness: Awake/alert Behavior During Therapy: WFL for tasks assessed/performed Overall Cognitive Status: Within Functional Limits for tasks assessed General Comments: pt scored 0/28 on Short Blessed Test screener to assess cognition, she demonstrated normal functioning with memory, problem solving, working memory   Physical Exam: Blood pressure (!) 143/85, pulse 61, temperature 97.7 F (36.5 C), temperature source Oral, resp. rate 18, height 5\' 8"  (1.727  m), weight 101.6 kg, SpO2 97 %. Physical Exam Constitutional:      Appearance: Normal appearance.  HENT:     Head: Normocephalic.     Right Ear: External ear normal.     Left Ear: External ear normal.     Nose: Nose normal.     Mouth/Throat:     Mouth: Mucous membranes are moist.     Pharynx: Oropharynx is clear.  Eyes:     Pupils: Pupils are equal, round, and reactive to light.  Cardiovascular:     Rate and Rhythm: Normal rate and regular rhythm.     Pulses: Normal pulses.     Heart sounds: No murmur heard. No gallop.   Pulmonary:     Effort: Pulmonary effort is normal. No respiratory distress.     Breath sounds: No wheezing or rales.  Abdominal:     General: Bowel sounds are normal. There is no distension.     Tenderness: There is no abdominal tenderness.  Musculoskeletal:        General: No swelling or deformity.     Cervical back: Normal range of motion.  Skin:    General: Skin is warm and dry.  Neurological:     Mental Status: She is alert.     Comments: Patient is alert in no acute distress makes eye contact with examiner.  Speech is a bit dysarthric but intelligible.  Oriented x3 with fair awareness of deficits. Reasonable insight. Left central 7. LUE 3/5 prox to distal. LLE 3+ to 4/5. Distal sensory loss in feet.   Psychiatric:        Mood and Affect: Mood normal.        Behavior: Behavior normal.        Lab Results Last 48 Hours        Results for orders placed or performed during the hospital encounter of 12/24/20 (from the past 48 hour(s))  CBG monitoring, ED     Status: Abnormal    Collection Time: 12/24/20 10:01 AM  Result Value Ref Range    Glucose-Capillary 293 (H) 70 - 99 mg/dL      Comment: Glucose reference range applies only to samples taken after fasting for at least 8 hours.  Protime-INR     Status: Abnormal    Collection Time: 12/24/20 10:34 AM  Result Value Ref Range    Prothrombin Time 15.4 (H) 11.4 - 15.2 seconds    INR 1.3 (H) 0.8 - 1.2       Comment: (NOTE) INR goal varies based on device and disease states. Performed at [facility], [place] [address], [place], [place] [address]    APTT     Status: None    Collection Time: 12/24/20 10:34 AM  Result Value Ref Range    aPTT 36 24 - 36 seconds      Comment: Performed at [facility] [address] [place], [place] [address]  CBC     Status: None    Collection Time: 12/24/20 10:34 AM  Result Value Ref Range    WBC 5.4 4.0 - 10.5 K/uL    RBC 4.58 3.87 - 5.11 MIL/uL    Hemoglobin 13.0 12.0 - 15.0 g/dL    HCT 39.8 36.0 - 46.0 %    MCV 86.9 80.0 - 100.0 fL    MCH 28.4 26.0 - 34.0 pg    MCHC 32.7 30.0 - 36.0 g/dL    RDW 13.3 11.5 - 15.5 %    Platelets 208 150 - 400 K/uL    nRBC 0.0 0.0 - 0.2 %      Comment: Performed at [facility], [place] [address] [place], [place] [address]  Differential     Status: None    Collection Time: 12/24/20 10:34 AM  Result Value Ref Range    Neutrophils Relative % 74 %    Neutro Abs 4.0 1.7 - 7.7 K/uL    Lymphocytes Relative 16 %    Lymphs Abs 0.8 0.7 - 4.0 K/uL    Monocytes Relative 7 %    Monocytes Absolute 0.4 0.1 - 1.0 K/uL    Eosinophils Relative 3 %    Eosinophils Absolute 0.1 0.0 - 0.5 K/uL    Basophils Relative 0 %    Basophils Absolute 0.0 0.0 - 0.1 K/uL    Immature Granulocytes 0 %    Abs Immature Granulocytes 0.02 0.00 - 0.07 K/uL      Comment: Performed at [facility], [address] [place], [place] [address]  Comprehensive metabolic panel     Status: Abnormal    Collection Time: 12/24/20 10:34 AM  Result Value Ref Range    Sodium 139 135 - 145 mmol/L    Potassium 3.3 (L) 3.5 - 5.1 mmol/L    Chloride 102 98 - 111 mmol/L    CO2 24 22 - 32 mmol/L    Glucose, Bld 305 (H) 70 - 99 mg/dL      Comment: Glucose reference range applies only to samples taken after fasting for at least 8 hours.    BUN 32 (H) 6 - 20 mg/dL    Creatinine, Ser 2.12 (H) 0.44 - 1.00 mg/dL    Calcium 9.4 8.9 - 10.3 mg/dL     Total Protein 7.2 6.5 - 8.1 g/dL    Albumin 3.1 (L) 3.5 - 5.0 g/dL    AST 25 15 - 41 U/L    ALT 18 0 - 44 U/L    Alkaline Phosphatase 95 38 - 126 U/L    Total Bilirubin 0.6 0.3 - 1.2 mg/dL    GFR, Estimated 28 (L) >60 mL/min      Comment: (NOTE) Calculated using the CKD-EPI Creatinine Equation (2021)      Anion gap 13 5 - 15      Comment: Performed at [facility] [address] [place], [place] [address]  I-Stat beta hCG blood, ED     Status: Abnormal    Collection Time: 12/24/20 10:42 AM  Result Value Ref Range    I-stat hCG, quantitative 6.0 (H) <5 mIU/mL    Comment 3               Comment:   GEST. AGE      CONC.  (mIU/mL)   <=1 WEEK        5 - 50     2 WEEKS       50 - 500     3 WEEKS       100 - 10,000     4 WEEKS     1,000 - 30,000        FEMALE AND NON-PREGNANT FEMALE:     LESS THAN 5 mIU/mL    Resp Panel by RT-PCR (Flu A&B, Covid) Nasopharyngeal Swab     Status: None    Collection Time: 12/24/20  4:18 PM    Specimen: Nasopharyngeal Swab; Nasopharyngeal(NP) swabs in vial transport medium  Result Value Ref Range    SARS Coronavirus 2 by RT PCR NEGATIVE NEGATIVE      Comment: (NOTE) SARS-CoV-2 target nucleic acids are NOT DETECTED.   The SARS-CoV-2 RNA is generally detectable in upper respiratory specimens during the acute phase of infection. The lowest concentration of SARS-CoV-2 viral copies this assay can detect is 138 copies/mL. A negative result does not preclude SARS-Cov-2 infection and should not be used as the sole basis for treatment or other patient management decisions. A negative result may occur with  improper specimen collection/handling, submission of specimen other than nasopharyngeal swab, presence of viral mutation(s) within the areas targeted by this assay, and inadequate number of viral copies(<138 copies/mL). A negative result must be combined with clinical observations, patient history, and epidemiological information. The expected result  is Negative.   Fact Sheet for Patients:  EntrepreneurPulse.com.au   Fact Sheet for Healthcare Providers:  IncredibleEmployment.be   This test is no t yet approved or cleared by the [place] and  has been authorized for detection and/or diagnosis of SARS-CoV-2 by FDA under an Emergency Use Authorization (EUA). This EUA will remain  in effect (meaning this test can be used) for the duration of the COVID-19 declaration under Section 564(b)(1) of the Act, 21 U.S.C.section 360bbb-3(b)(1), unless the authorization is terminated  or revoked sooner.           Influenza A by PCR NEGATIVE NEGATIVE    Influenza B by PCR NEGATIVE NEGATIVE      Comment: (NOTE) The Xpert Xpress SARS-CoV-2/FLU/RSV plus assay is intended as an aid in the diagnosis of influenza from Nasopharyngeal swab specimens and should not be used as a sole basis for treatment. Nasal washings and aspirates are unacceptable for Xpert Xpress SARS-CoV-2/FLU/RSV testing.   Fact Sheet for Patients: EntrepreneurPulse.com.au   Fact Sheet for Healthcare Providers: IncredibleEmployment.be   This test is not yet approved or cleared by the [place] FDA and has been authorized for detection and/or diagnosis of SARS-CoV-2 by FDA under an Emergency Use Authorization (EUA). This EUA will remain in effect (meaning this test can be used) for the duration of the COVID-19 declaration under Section 564(b)(1) of the Act, 21 U.S.C. section 360bbb-3(b)(1), unless the authorization is terminated or revoked.   Performed at [facility], [place] [address], [place], [place] [address]    Urine rapid drug screen (hosp performed)not at [facility]     Status: None    Collection Time: 12/24/20  6:59 PM  Result Value Ref Range    Opiates NONE DETECTED NONE DETECTED    Cocaine NONE DETECTED NONE DETECTED    Benzodiazepines NONE DETECTED NONE DETECTED    Amphetamines  NONE DETECTED NONE DETECTED    Tetrahydrocannabinol NONE DETECTED NONE DETECTED    Barbiturates NONE DETECTED NONE DETECTED      Comment: (NOTE) DRUG SCREEN FOR MEDICAL PURPOSES ONLY.  IF CONFIRMATION IS NEEDED FOR ANY PURPOSE, NOTIFY LAB WITHIN 5 DAYS.   LOWEST DETECTABLE LIMITS FOR URINE DRUG SCREEN Drug Class                     Cutoff (ng/mL) Amphetamine and metabolites    1000 Barbiturate and metabolites    200 Benzodiazepine                 491 Tricyclics and metabolites     300 Opiates and metabolites        300 Cocaine and metabolites        300 THC                            50 Performed at [facility], [place] [address], [place], [place] [address]    Glucose, capillary     Status: Abnormal    Collection Time: 12/24/20  9:26 PM  Result Value Ref Range    Glucose-Capillary 240 (H) 70 - 99 mg/dL      Comment: Glucose reference range applies only to samples taken after fasting for at least 8 hours.  Hemoglobin A1c     Status: Abnormal    Collection Time: 12/25/20  2:02 AM  Result Value Ref Range    Hgb A1c MFr Bld 8.0 (H) 4.8 - 5.6 %      Comment: (NOTE) Pre diabetes:          5.7%-6.4%   Diabetes:              >6.4%  Glycemic control for   <7.0% adults with diabetes      Mean Plasma Glucose 182.9 mg/dL      Comment: Performed at [facility], [place] [address] [place], [place] [address]  Lipid panel     Status: Abnormal    Collection Time: 12/25/20  2:02 AM  Result Value Ref Range    Cholesterol 260 (H) 0 - 200 mg/dL    Triglycerides 141 <150 mg/dL    HDL 59 >40 mg/dL    Total CHOL/HDL Ratio 4.4 RATIO    VLDL 28 0 - 40 mg/dL    LDL Cholesterol 173 (H) 0 - 99 mg/dL      Comment:        Total Cholesterol/HDL:CHD Risk Coronary Heart Disease Risk Table                     Men   Women  1/2 Average Risk   3.4   3.3  Average Risk       5.0   4.4  2 X Average Risk   9.6   7.1  3 X Average Risk  23.4   11.0        Use the calculated Patient  Ratio above and the CHD Risk Table to determine the patient's CHD Risk.        ATP III CLASSIFICATION (LDL):  <100     mg/dL   Optimal  100-129  mg/dL   Near or Above                    Optimal  130-159  mg/dL   Borderline  160-189  mg/dL   High  >190     mg/dL   Very High Performed at [facility] [address], [place], [place] [address]    TSH     Status: None    Collection Time: 12/25/20  2:02 AM  Result Value Ref Range    TSH 1.712 0.350 - 4.500 uIU/mL      Comment: Performed by a 3rd Generation assay with a functional sensitivity of <=0.01 uIU/mL. Performed at [facility], [place] [address] [place], [place] [address]    Glucose, capillary     Status: Abnormal    Collection Time: 12/25/20  6:17 AM  Result Value Ref Range    Glucose-Capillary 148 (H) 70 - 99 mg/dL      Comment: Glucose reference range applies only to samples taken after fasting for at least 8 hours.  Glucose, capillary     Status: Abnormal    Collection Time: 12/25/20 12:11 PM  Result Value Ref Range    Glucose-Capillary 140 (H) 70 - 99 mg/dL      Comment: Glucose reference range applies only to samples taken after fasting for at least 8 hours.  Glucose, capillary     Status: Abnormal    Collection Time: 12/25/20  4:11 PM  Result Value Ref Range    Glucose-Capillary 232 (H) 70 - 99 mg/dL      Comment: Glucose reference range applies only to samples taken after fasting for at least 8 hours.  Glucose, capillary     Status: Abnormal    Collection Time: 12/25/20  9:50 PM  Result Value Ref Range    Glucose-Capillary 115 (H) 70 - 99 mg/dL      Comment: Glucose reference range applies only to samples taken after fasting for at least 8 hours.  Imaging Results (Last 48 hours)  CT HEAD WO CONTRAST   Result Date: 12/24/2020 CLINICAL DATA:  Neuro deficit, acute stroke suspected. Abnormal gait and aphasia stray. Today's having left-sided weakness. EXAM: CT HEAD WITHOUT CONTRAST TECHNIQUE:  Contiguous axial images were obtained from the base of the skull through the vertex without intravenous contrast. COMPARISON:  None. FINDINGS: Brain: Small area of hypoattenuation in the left periatrial white matter (see series 5, image 44 and series 3, image 15). Additional small area of hypoattenuation in the right corona radiata. No acute hemorrhage. No hydrocephalus. No mass lesion. No abnormal mass effect. No midline shift. No extra-axial fluid collection. Vascular: No hyperdense vessel identified. Calcific atherosclerosis. Skull: No acute fracture. Punctate foreign body in the subcutaneous high posterior left scalp without soft tissue swelling. Sinuses/Orbits: No acute finding. Other: No mastoid effusions. IMPRESSION: 1. Small area of hypoattenuation in the left periatrial white matter could represent chronic microvascular ischemic disease or age indeterminate lacunar infarct. Additional age indeterminate lacunar infarct within the right corona radiata, favored remote. If there is concern for acute infarct, MRI could further evaluate. 2. No acute hemorrhage. 3. Punctate foreign body in the subcutaneous high posterior left scalp, likely remote given no scalp swelling. Electronically Signed   By: Margaretha Sheffield MD   On: 12/24/2020 12:30    MR ANGIO HEAD WO CONTRAST   Result Date: 12/24/2020 CLINICAL DATA:  Follow-up examination for acute stroke. EXAM: MRA HEAD WITHOUT CONTRAST TECHNIQUE: Angiographic images of the Circle of Willis were obtained using MRA technique without intravenous contrast. COMPARISON:  Prior MRI from earlier the same day. FINDINGS: ANTERIOR CIRCULATION: Visualized distal cervical segments of the internal carotid arteries are widely patent with symmetric antegrade flow. Petrous, cavernous, and supraclinoid segments widely patent without stenosis or other abnormality. A1 segments widely patent. Normal anterior communicating artery complex. Anterior cerebral arteries widely patent to  their distal aspects. No M1 stenosis or occlusion. Normal MCA bifurcations. Distal MCA branches well perfused and symmetric. POSTERIOR CIRCULATION: Both V4 segments widely patent to the vertebrobasilar junction without stenosis. Left vertebral artery dominant. Both PICA origins patent and normal. Basilar widely patent to its distal aspect. Superior cerebellar arteries patent bilaterally. Both PCAs supplied via the basilar as well as small bilateral posterior communicating arteries. PCAs well perfused to their distal aspects without stenosis. No intracranial aneurysm or other vascular abnormality. IMPRESSION: Normal intracranial MRA. No large vessel occlusion, hemodynamically significant stenosis, or other acute vascular abnormality. Electronically Signed   By: Jeannine Boga M.D.   On: 12/24/2020 22:40    MR BRAIN WO CONTRAST   Result Date: 12/24/2020 CLINICAL DATA:  Neuro deficit, acute stroke suspected. EXAM: MRI HEAD WITHOUT CONTRAST TECHNIQUE: Multiplanar, multiecho pulse sequences of the brain and surrounding structures were obtained without intravenous contrast. COMPARISON:  Same day head CT. FINDINGS: Brain: Acute infarct within the right hemi pons. Mild associated edema without substantial mass effect. Small acute infarct in the left frontal subcortical white matter (see series 2 and 250, image 29) without associated edema. Small remote lacunar infarcts in the right corona radiata and left periatrial white matter. No hydrocephalus. No acute hemorrhage. No mass lesion. No extra-axial fluid collection. Vascular: Major arterial flow voids are maintained at the skull base. Skull and upper cervical spine: Normal marrow signal. Sinuses/Orbits: Sinuses are clear.  Unremarkable orbits. IMPRESSION: 1. Acute infarct in the right hemipons. Additional small acute infarct in the left frontal subcortical white matter. 2. Mild associated edema in the right pons without substantial mass  effect. 3. Small remote  lacunar infarcts in the right corona radiata and left periatrial white matter. Findings discussed with Dr. Shelly Coss via telephone at 2:02 PM. Electronically Signed   By: Margaretha Sheffield MD   On: 12/24/2020 14:05    ECHOCARDIOGRAM COMPLETE   Result Date: 12/25/2020    ECHOCARDIOGRAM REPORT   Patient Name:   PADME ARRIAGA Date of Exam: 12/25/2020 Medical Rec #:  [id number]      Height:       68.0 in Accession #:    [id number]     Weight:       224.0 lb Date of Birth:  1966-02-19      BSA:          2.145 m Patient Age:    68 years       BP:           137/71 mmHg Patient Gender: F              HR:           81 bpm. Exam Location:  Inpatient Procedure: 2D Echo, Color Doppler and Cardiac Doppler Indications:    Stroke i163.96  History:        Patient has prior history of Echocardiogram examinations, most                 recent 06/16/2020. CHF, Arrythmias:Atrial Fibrillation; Risk                 Factors:Hypertension, Diabetes and Dyslipidemia.  Sonographer:    Raquel Sarna Senior RDCS Referring Phys: [place]  1. Since the last study on 06/16/20 LVEF remains mildly decreased with diffuse hypokinesis and LVEF 45-50%. RVEF is mildly decreased, RV is now less dilated and RVSP has improved from 50 to 30 mmHg.  2. Left ventricular ejection fraction, by estimation, is 45 to 50%. The left ventricle has mildly decreased function. The left ventricle demonstrates global hypokinesis. Left ventricular diastolic function could not be evaluated.  3. Right ventricular systolic function is mildly reduced. The right ventricular size is normal. There is normal pulmonary artery systolic pressure. The estimated right ventricular systolic pressure is 58.8 mmHg.  4. Left atrial size was mildly dilated.  5. The mitral valve is normal in structure. Mild mitral valve regurgitation. No evidence of mitral stenosis.  6. The aortic valve is normal in structure. Aortic valve regurgitation is not visualized. No aortic stenosis is  present.  7. The inferior vena cava is normal in size with <50% respiratory variability, suggesting right atrial pressure of 8 mmHg. FINDINGS  Left Ventricle: Left ventricular ejection fraction, by estimation, is 45 to 50%. The left ventricle has mildly decreased function. The left ventricle demonstrates global hypokinesis. The left ventricular internal cavity size was normal in size. There is  no left ventricular hypertrophy. Left ventricular diastolic function could not be evaluated due to atrial fibrillation. Left ventricular diastolic function could not be evaluated. Right Ventricle: The right ventricular size is normal. No increase in right ventricular wall thickness. Right ventricular systolic function is mildly reduced. There is normal pulmonary artery systolic pressure. The tricuspid regurgitant velocity is 2.35 m/s, and with an assumed right atrial pressure of 8 mmHg, the estimated right ventricular systolic pressure is 50.2 mmHg. Left Atrium: Left atrial size was mildly dilated. Right Atrium: Right atrial size was normal in size. Pericardium: There is no evidence of pericardial effusion. Mitral Valve: The mitral valve is normal in structure. Mild mitral valve regurgitation. No  evidence of mitral valve stenosis. Tricuspid Valve: The tricuspid valve is normal in structure. Tricuspid valve regurgitation is mild . No evidence of tricuspid stenosis. Aortic Valve: The aortic valve is normal in structure. Aortic valve regurgitation is not visualized. No aortic stenosis is present. Pulmonic Valve: The pulmonic valve was normal in structure. Pulmonic valve regurgitation is not visualized. No evidence of pulmonic stenosis. Aorta: The aortic root is normal in size and structure. Venous: The inferior vena cava is normal in size with less than 50% respiratory variability, suggesting right atrial pressure of 8 mmHg. IAS/Shunts: No atrial level shunt detected by color flow Doppler.  LEFT VENTRICLE PLAX 2D LVIDd:          4.80 cm LVIDs:         4.20 cm LV PW:         1.20 cm LV IVS:        0.70 cm LVOT diam:     2.30 cm LV SV:         54 LV SV Index:   25 LVOT Area:     4.15 cm  LV Volumes (MOD) LV vol d, MOD A2C: 84.7 ml LV vol d, MOD A4C: 99.4 ml LV vol s, MOD A2C: 60.2 ml LV vol s, MOD A4C: 65.7 ml LV SV MOD A2C:     24.5 ml LV SV MOD A4C:     99.4 ml LV SV MOD BP:      31.4 ml RIGHT VENTRICLE RV S prime:     7.51 cm/s TAPSE (M-mode): 2.3 cm LEFT ATRIUM             Index       RIGHT ATRIUM           Index LA diam:        3.60 cm 1.68 cm/m  RA Area:     15.70 cm LA Vol (A2C):   83.1 ml 38.75 ml/m RA Volume:   41.60 ml  19.40 ml/m LA Vol (A4C):   62.1 ml 28.96 ml/m LA Biplane Vol: 71.2 ml 33.20 ml/m  AORTIC VALVE LVOT Vmax:   66.25 cm/s LVOT Vmean:  43.275 cm/s LVOT VTI:    0.129 m  AORTA Ao Root diam: 2.80 cm TRICUSPID VALVE TR Peak grad:   22.1 mmHg TR Vmax:        235.00 cm/s  SHUNTS Systemic VTI:  0.13 m Systemic Diam: 2.30 cm Ena Dawley MD Electronically signed by Ena Dawley MD Signature Date/Time: 12/25/2020/11:53:23 AM    Final     VAS US CAROTID   Result Date: 12/25/2020 Carotid Arterial Duplex Study Indications:   CVA and Left side weakness, left facial drrop, speech                disturbance. Risk Factors:  Hypertension, Diabetes, no history of smoking. Other Factors: CKD4, CHF, AFIB, Obesity. Performing Technologist: Rogelia Rohrer  Examination Guidelines: A complete evaluation includes B-mode imaging, spectral Doppler, color Doppler, and power Doppler as needed of all accessible portions of each vessel. Bilateral testing is considered an integral part of a complete examination. Limited examinations for reoccurring indications may be performed as noted.  Right Carotid Findings: +----------+--------+--------+--------+------------------+--------+           PSV cm/sEDV cm/sStenosisPlaque DescriptionComments +----------+--------+--------+--------+------------------+--------+ CCA Prox  97      11                                          +----------+--------+--------+--------+------------------+--------+  CCA Distal78      28                                         +----------+--------+--------+--------+------------------+--------+ ICA Prox  65      16      1-39%                              +----------+--------+--------+--------+------------------+--------+ ICA Distal109     25                                         +----------+--------+--------+--------+------------------+--------+ ECA       89      0                                          +----------+--------+--------+--------+------------------+--------+ +----------+--------+-------+----------------+-------------------+           PSV cm/sEDV cmsDescribe        Arm Pressure (mmHG) +----------+--------+-------+----------------+-------------------+ [id number]     0      Multiphasic, WNL                    +----------+--------+-------+----------------+-------------------+ +---------+--------+--+--------+--+---------+ VertebralPSV cm/s61EDV cm/s13Antegrade +---------+--------+--+--------+--+---------+  Left Carotid Findings: +----------+--------+--------+--------+------------------+--------+           PSV cm/sEDV cm/sStenosisPlaque DescriptionComments +----------+--------+--------+--------+------------------+--------+ CCA Prox  107     15                                         +----------+--------+--------+--------+------------------+--------+ CCA Distal84      18                                         +----------+--------+--------+--------+------------------+--------+ ICA Prox  89      25      1-39%                              +----------+--------+--------+--------+------------------+--------+ ICA Distal93      28                                         +----------+--------+--------+--------+------------------+--------+ ECA       64      0                                           +----------+--------+--------+--------+------------------+--------+ +----------+--------+--------+----------------+-------------------+           PSV cm/sEDV cm/sDescribe        Arm Pressure (mmHG) +----------+--------+--------+----------------+-------------------+ [id number]      11      Multiphasic, WNL                    +----------+--------+--------+----------------+-------------------+ +---------+--------+--+--------+--+---------+ VertebralPSV cm/s63EDV cm/s19Antegrade +---------+--------+--+--------+--+---------+   Summary:  Right Carotid: Velocities in the right ICA are consistent with a 1-39% stenosis.                The extracranial vessels were near-normal with only minimal wall                thickening or plaque. Left Carotid: Velocities in the left ICA are consistent with a 1-39% stenosis.               The extracranial vessels were near-normal with only minimal wall               thickening or plaque. Vertebrals:  Bilateral vertebral arteries demonstrate antegrade flow. Subclavians: Normal flow hemodynamics were seen in bilateral subclavian              arteries. *See table(s) above for measurements and observations.     Preliminary              Medical Problem List and Plan: 1.  Left side weakness with facial droop/slurred speech secondary to right pontine lacunar infarction             -patient may shower             -ELOS/Goals: 9-12 days, supervision PT, OT 2.  Antithrombotics: -DVT/anticoagulation: Pradaxa             -antiplatelet therapy: Aspirin 81 mg daily 3. Pain Management: Tylenol as needed 4. Mood: Provide emotional support             -antipsychotic agents: N/A 5. Neuropsych: This patient is capable of making decisions on her own behalf. 6. Skin/Wound Care: Routine skin checks 7. Fluids/Electrolytes/Nutrition: Routine in and outs with follow-up chemistries 8.  Atrial fibrillation.  Cardiac rate controlled and regular.  Continue Pradaxa as  well as Mexitil 300 mg every 12 hours 9.  Hypertension.  Coreg 3.125 mg twice daily.  Monitor with increased activity. 10.  Diastolic congestive heart failure.  Monitor for any signs of fluid overload 11.  CKD stage III.  Baseline creatinine 1.55-2.38.  Follow-up chemistries Monday 12.  Diabetes mellitus with peripheral neuropathy.  Hemoglobin A1c 8.0.  Lantus insulin 20 units daily.  Check blood sugars before meals and at bedtime.  Diabetic teaching 13.  Hyperlipidemia.  Lipitor 14.  Obesity.  BMI 34.06.  Dietary follow-up       Cathlyn Parsons, PA-C 12/26/2020  I have personally performed a face to face diagnostic evaluation of this patient and formulated the key components of the plan.  Additionally, I have personally reviewed laboratory data, imaging studies, as well as relevant notes and concur with the physician assistant's documentation above.  The patient's status has not changed from the original H&P.  Any changes in documentation from the acute care chart have been noted above.  Meredith Staggers, MD, [facility]

## 2020-12-28 ENCOUNTER — Inpatient Hospital Stay (HOSPITAL_COMMUNITY): Payer: Self-pay | Admitting: Physical Therapy

## 2020-12-28 ENCOUNTER — Inpatient Hospital Stay (HOSPITAL_COMMUNITY): Payer: Self-pay | Admitting: Occupational Therapy

## 2020-12-28 DIAGNOSIS — I5032 Chronic diastolic (congestive) heart failure: Secondary | ICD-10-CM

## 2020-12-28 LAB — GLUCOSE, CAPILLARY
Glucose-Capillary: 131 mg/dL — ABNORMAL HIGH (ref 70–99)
Glucose-Capillary: 139 mg/dL — ABNORMAL HIGH (ref 70–99)
Glucose-Capillary: 147 mg/dL — ABNORMAL HIGH (ref 70–99)
Glucose-Capillary: 147 mg/dL — ABNORMAL HIGH (ref 70–99)

## 2020-12-28 MED ORDER — CALCIUM CARBONATE 1250 (500 CA) MG PO TABS
1.0000 | ORAL_TABLET | Freq: Every day | ORAL | Status: DC
  Administered 2020-12-28 – 2021-01-03 (×7): 500 mg via ORAL
  Filled 2020-12-28 (×8): qty 1

## 2020-12-28 MED ORDER — HYDRALAZINE HCL 25 MG PO TABS
25.0000 mg | ORAL_TABLET | Freq: Three times a day (TID) | ORAL | Status: DC
  Administered 2020-12-28 – 2020-12-29 (×4): 25 mg via ORAL
  Filled 2020-12-28 (×4): qty 1

## 2020-12-28 MED ORDER — DAPAGLIFLOZIN PROPANEDIOL 10 MG PO TABS
10.0000 mg | ORAL_TABLET | Freq: Every day | ORAL | Status: DC
  Administered 2020-12-28 – 2021-01-03 (×7): 10 mg via ORAL
  Filled 2020-12-28 (×9): qty 1

## 2020-12-28 MED ORDER — ADULT MULTIVITAMIN W/MINERALS CH
1.0000 | ORAL_TABLET | Freq: Every day | ORAL | Status: DC
  Administered 2020-12-28 – 2021-01-03 (×7): 1 via ORAL
  Filled 2020-12-28 (×7): qty 1

## 2020-12-28 NOTE — Evaluation (Signed)
Physical Therapy Assessment and Plan  Patient Details  Name: Adrienne Lane MRN: [id number] Date of Birth: 1966-03-22  PT Diagnosis: Abnormality of gait, Hemiparesis non-dominant and Muscle weakness Rehab Potential: Excellent ELOS: 9-12 days   Today's Date: 12/28/2020 PT Individual Time: 0800-0900 PT Individual Time Calculation (min): 60 min    Hospital Problem: Principal Problem:   Right pontine cerebrovascular accident [facility])   Past Medical History:  Past Medical History:  Diagnosis Date  . Atrial fibrillation with RVR ([place])   . [place] 01/2020  . CHF (congestive heart failure) ([place]) 10/20/2019  . CKD (chronic kidney disease), stage IV ([place])   . Diabetes mellitus without complication ([place])   . DOE (dyspnea on exertion)    walking upstairs or up hill resolves in one minute  . Fibroids   . History of kidney stones   . History of recent blood transfusion 02/26/2020  . Hypertension   . Iron deficiency anemia   . Non-ischemic cardiomyopathy (H[facility])    tachycardia induced  . Obese   . Peripheral edema   . Premature ventricular contractions (PVCs) ([facility])   . Umbilical hernia   . Wears glasses    Past Surgical History:  Past Surgical History:  Procedure Laterality Date  . CHOLECYSTECTOMY    . CYSTOSCOPY W/ URETERAL STENT PLACEMENT Bilateral 05/24/2020   Procedure: CYSTOSCOPY WITH RETROGRADE PYELOGRAM/URETERAL STENT PLACEMENT;  Surgeon: Robley Fries, MD;  Location: [facility];  Service: Urology;  Laterality: Bilateral;  . CYSTOSCOPY/RETROGRADE/URETEROSCOPY Bilateral 04/03/2020   Procedure: CYSTOSCOPY/RETROGRADE/URETEROSCOPY;  Surgeon: Ardis Hughs, MD;  Location: [facility];  Service: Urology;  Laterality: Bilateral;  . HYSTERECTOMY ABDOMINAL WITH SALPINGO-OOPHORECTOMY  07/21/2020   Procedure: HYSTERECTOMY ABDOMINAL WITH BILATERAL SALPINGO-OOPHORECTOMY;  Surgeon: Sanjuana Kava, MD;  Location: [place];  Service: Gynecology;;  . IR FLUORO GUIDE CV LINE RIGHT  02/21/2020  . IR US GUIDE  VASC ACCESS RIGHT  02/21/2020  . NEPHROLITHOTOMY Left 02/14/2020   Procedure: NEPHROLITHOTOMY PERCUTANEOUS/ SURGEON ACCESS/ LEFT PERCUTANEOUS NEPHROSTOMY TUBE PLACEMENT;  Surgeon: Ardis Hughs, MD;  Location: [facility];  Service: Urology;  Laterality: Left;  . NEPHROLITHOTOMY Left 02/21/2020   Procedure: NEPHROLITHOTOMY PERCUTANEOUS SECOND LOOK;  Surgeon: Ardis Hughs, MD;  Location: [facility];  Service: Urology;  Laterality: Left;  . NEPHROLITHOTOMY Left 02/26/2020   Procedure: NEPHROLITHOTOMY PERCUTANEOUS;  Surgeon: Ceasar Mons, MD;  Location: [facility];  Service: Urology;  Laterality: Left;  NEED 150 MIN  . NEPHROLITHOTOMY Right 03/24/2020   Procedure: NEPHROLITHOTOMY PERCUTANEOUS WITH ACCESS LEFT STENT REMOVAL;  Surgeon: Ardis Hughs, MD;  Location[facility];  Service: Urology;  Laterality: Right;  . RIGHT HEART CATH N/A 11/09/2019   Procedure: RIGHT HEART CATH;  Surgeon: Jolaine Artist, MD;  Location: [place] [facility];  Service: Cardiovascular;  Laterality: N/A;  . WISDOM TOOTH EXTRACTION      Assessment & Plan Clinical Impression:  MichelleVereenis a 55 year old right-handed female with history of atrial fibrillation RVR maintained on Eliquis, diastolic congestive heart failure, CKD stage III, diabetes mellitus, obesity with BMI 34.06, hypertension, cardiomyopathy. Per chart review lives with spouse and also mother-in-law and son. Mobile home 5 steps to entry. Reportedly independent prior to admission. Presented 12/24/2020 with left-sided weakness facial droop and slurred speech. CT/MRI showed acute infarct right hemipons as well as additional small acute infarct left frontal subcortical white matter. Small remote lacunar infarct in the right corona radiata and left periatrial white matter. Patient did not receive TPA. MRA of the head negative. Echocardiogram with ejection  fraction of 45 to 50% without thrombus. Admission chemistries unremarkable except potassium  3.3 glucose 305 BUN 32 creatinine 2.12 urine drug screen negative. Neurology follow-up patient's Eliquis was changed to Pradaxa for CVA prophylaxis as well as the addition of low-dose aspirin.. Maintain on a regular diet. Due to patient's left-sided weakness facial droop slurred speech and decreased functional mobility she was admitted for a comprehensive rehab program. Patient transferred to CIR on 12/27/2020 .   Patient currently requires min with mobility secondary to muscle weakness, decreased cardiorespiratoy endurance, abnormal tone, unbalanced muscle activation and decreased coordination, decreased visual acuity and decreased sitting balance, decreased standing balance, decreased postural control, hemiplegia and decreased balance strategies.  Prior to hospitalization, patient was independent  with mobility and lived with Son,Family,Spouse in a Mobile home home.  Home access is 5Stairs to enter.  Patient will benefit from skilled PT intervention to maximize safe functional mobility, minimize fall risk and decrease caregiver burden for planned discharge home with 24 hour supervision.  Anticipate patient will benefit from follow up OP at discharge.  PT - End of Session Activity Tolerance: Tolerates 30+ min activity with multiple rests Endurance Deficit: Yes Endurance Deficit Description: frequent rest breaks during functional activity, fatigues quickly PT Assessment Rehab Potential (ACUTE/IP ONLY): Excellent PT Barriers to Discharge: Home environment access/layout PT Patient demonstrates impairments in the following area(s): Balance;Endurance;Motor;Safety;Sensory PT Transfers Functional Problem(s): Bed Mobility;Bed to Chair;Car;Furniture;Floor PT Locomotion Functional Problem(s): Ambulation;Wheelchair Mobility;Stairs PT Plan PT Intensity: Minimum of 1-2 x/day ,45 to 90 minutes PT Frequency: 5 out of 7 days PT Duration Estimated Length of Stay: 9-12 days PT Treatment/Interventions:  Ambulation/gait training;Balance/vestibular training;Cognitive remediation/compensation;Community reintegration;Discharge planning;Disease management/prevention;DME/adaptive equipment instruction;Functional electrical stimulation;Functional mobility training;Neuromuscular re-education;Pain management;Patient/family education;Splinting/orthotics;Stair training;Therapeutic Activities;Therapeutic Exercise;UE/LE Strength taining/ROM;UE/LE Coordination activities;Visual/perceptual remediation/compensation PT Transfers Anticipated Outcome(s): Supervision PT Locomotion Anticipated Outcome(s): Supervision with LRAD PT Recommendation Recommendations for Other Services: Speech consult;Therapeutic Recreation consult Therapeutic Recreation Interventions: Stress management Follow Up Recommendations: Outpatient PT Patient destination: Home Equipment Recommended: Rolling walker with 5" wheels Equipment Details: TBD pending progress   PT Evaluation Precautions/Restrictions Precautions Precautions: Fall Restrictions Weight Bearing Restrictions: No Pain Pain Assessment Pain Scale: 0-10 Pain Score: 0-No pain Home Living/Prior Functioning Home Living Available Help at Discharge: Family;Available 24 hours/day Type of Home: Mobile home Home Access: Stairs to enter Entrance Stairs-Number of Steps: 5 Entrance Stairs-Rails: Can reach both Home Layout: One level 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  Lives With: Son;Family;Spouse Prior Function Level of Independence: Independent with gait;Independent with transfers  Able to Take Stairs?: Yes Driving: Yes Vocation: Full time employment Vision/Perception  Vision - History Baseline Vision: No visual deficits Patient Visual Report: Blurring of vision (reports "foggy" and "floaters" in L eye since CVA) Vision - Assessment Vision Assessment: Vision impaired - to be further tested in functional context Additional Comments: decreased  peripheral vision L side Perception Perception: Within Functional Limits Praxis Praxis: Intact  Cognition Overall Cognitive Status: Within Functional Limits for tasks assessed Arousal/Alertness: Awake/alert Orientation Level: Oriented X4 Attention: Focused;Sustained Focused Attention: Appears intact Sustained Attention: Appears intact Memory: Appears intact Awareness: Appears intact Problem Solving: Appears intact Safety/Judgment: Appears intact Sensation Sensation Light Touch: Appears Intact Proprioception: Appears Intact Coordination Gross Motor Movements are Fluid and Coordinated: No Fine Motor Movements are Fluid and Coordinated: No Coordination and Movement Description: impaired 2/2 L mild hemi Heel Shin Test: impaired L side Motor  Motor Motor: Hemiplegia;Abnormal tone;Abnormal postural alignment and control Motor - Skilled Clinical Observations: mild L hemi  Trunk/Postural Assessment  Cervical Assessment Cervical Assessment: Within Functional Limits Thoracic Assessment Thoracic Assessment: Exceptions to [facility] (rounded shoulders) Lumbar Assessment Lumbar Assessment: Within Functional Limits Postural Control Postural Control: Deficits on evaluation (delayed)  Balance Balance Balance Assessed: Yes Static Sitting Balance Static Sitting - Balance Support: No upper extremity supported;Feet supported Static Sitting - Level of Assistance: 5: Stand by assistance Dynamic Sitting Balance Dynamic Sitting - Balance Support: No upper extremity supported;Feet supported;During functional activity Dynamic Sitting - Level of Assistance: 5: Stand by assistance;4: Min assist Static Standing Balance Static Standing - Balance Support: Bilateral upper extremity supported;During functional activity Static Standing - Level of Assistance: 4: Min assist Dynamic Standing Balance Dynamic Standing - Balance Support: Bilateral upper extremity supported;During functional activity Dynamic  Standing - Level of Assistance: 4: Min assist Extremity Assessment   RLE Assessment RLE Assessment: Exceptions to [facility] Strength Comments: impaired, see below RLE Strength Right Hip Flexion: 3+/5 Right Knee Flexion: 5/5 Right Knee Extension: 5/5 Right Ankle Dorsiflexion: 4/5 LLE Assessment LLE Assessment: Exceptions to [facility] Strength Comments: impaired, see below LLE Strength Left Hip Flexion: 3/5 Left Knee Flexion: 3/5 Left Knee Extension: 4/5 Left Ankle Dorsiflexion: 4/5  Care Tool Care Tool Bed Mobility Roll left and right activity   Roll left and right assist level: Supervision/Verbal cueing    Sit to lying activity   Sit to lying assist level: Contact Guard/Touching assist    Lying to sitting edge of bed activity   Lying to sitting edge of bed assist level: Contact Guard/Touching assist     Care Tool Transfers Sit to stand transfer   Sit to stand assist level: Minimal Assistance - Patient > 75%    Chair/bed transfer   Chair/bed transfer assist level: Minimal Assistance - Patient > 75%     Physiological scientist transfer assist level: Moderate Assistance - Patient 50 - 74%      Care Tool Locomotion Ambulation   Assist level: Minimal Assistance - Patient > 75% Assistive device: Walker-rolling Max distance: 200'  Walk 10 feet activity   Assist level: Minimal Assistance - Patient > 75% Assistive device: Walker-rolling   Walk 50 feet with 2 turns activity   Assist level: Minimal Assistance - Patient > 75% Assistive device: Walker-rolling  Walk 150 feet activity   Assist level: Minimal Assistance - Patient > 75% Assistive device: Walker-rolling  Walk 10 feet on uneven surfaces activity Walk 10 feet on uneven surfaces activity did not occur: Safety/medical concerns      Stairs Stair activity did not occur: Safety/medical concerns        Walk up/down 1 step activity Walk up/down 1 step or curb (drop down) activity did not  occur: Safety/medical concerns     Walk up/down 4 steps activity did not occuR: Safety/medical concerns  Walk up/down 4 steps activity      Walk up/down 12 steps activity Walk up/down 12 steps activity did not occur: Safety/medical concerns      Pick up small objects from floor Pick up small object from the floor (from standing position) activity did not occur: Safety/medical concerns      Wheelchair Will patient use wheelchair at discharge?: No          Wheel 50 feet with 2 turns activity      Wheel 150 feet activity        Refer to Care Plan for Long Term Goals  SHORT TERM GOAL WEEK 1  PT Short Term Goal 1 (Week 1): Pt will complete least restrictive transfer with CGA consistently PT Short Term Goal 2 (Week 1): Pt will initiate stair training PT Short Term Goal 3 (Week 1): Pt will ambulate x 150 ft with LRAD and CGA consistently  Recommendations for other services: Therapeutic Recreation  Stress management and Other: Speech Therapy Evaluation  Skilled Therapeutic Intervention Evaluation completed (see details above and below) with education on PT POC and goals and individual treatment initiated with focus on functional transfer and gait assessment, setting pt up with appropriate equipment to be used during rehab stay, and orientation to rehab unit and schedule. Pt received semi-reclined in bed, agreeable to PT evaluation. No complaints of pain. Rolling L/R with Supervision on flat bed. Supine to/from sit with CGA with use of bedrail from flat bed. Sit to stand with min A initially with no AD. Stand pivot transfer to w/c with min HHA required for balance. Ambulation x 200 ft with use of RW at min A level, step-to gait pattern with decreased L knee flexion and decreased DF noted. Pt reports feeling that her L knee is going to "buckle backwards" with onset of fatigue. Car transfer with mod A needed for BLE management and use of RW, max cueing for sequencing of transfer. Pt agreeable to  sit up in recliner at end of session, stand pivot transfer w/c to recliner with RW and min A. Pt left seated in recliner in room with needs in reach, chair alarm in place.  Mobility Bed Mobility Bed Mobility: Rolling Right;Rolling Left;Supine to Sit;Sit to Supine Rolling Right: Supervision/verbal cueing Rolling Left: Supervision/Verbal cueing Supine to Sit: Contact Guard/Touching assist Sit to Supine: Contact Guard/Touching assist Transfers Transfers: Sit to Stand;Stand Pivot Transfers Sit to Stand: Minimal Assistance - Patient > 75% Stand Pivot Transfers: Minimal Assistance - Patient > 75% Stand Pivot Transfer Details: Verbal cues for sequencing;Verbal cues for technique;Verbal cues for precautions/safety Transfer (Assistive device): 1 person hand held assist Locomotion  Gait Gait Distance (Feet): 200 Feet Assistive device: Rolling walker Gait Gait Pattern: Impaired (step-to, decreased L knee flexion and DF) Gait velocity: decreased Stairs / Additional Locomotion Stairs: No Wheelchair Mobility Wheelchair Mobility: No   Discharge Criteria: Patient will be discharged from PT if patient refuses treatment 3 consecutive times without medical reason, if treatment goals not met, if there is a change in medical status, if patient makes no progress towards goals or if patient is discharged from hospital.  The above assessment, treatment plan, treatment alternatives and goals were discussed and mutually agreed upon: by patient   Excell Seltzer, PT, DPT 12/28/2020, 12:41 PM

## 2020-12-28 NOTE — Progress Notes (Signed)
Physical Therapy Session Note  Patient Details  Name: Adrienne Lane MRN: [id number] Date of Birth: 07/09/1966  Today's Date: 12/28/2020 PT Individual Time: 1615-1700 PT Individual Time Calculation (min): 45 min   Short Term Goals: Week 1:  PT Short Term Goal 1 (Week 1): Pt will complete least restrictive transfer with CGA consistently PT Short Term Goal 2 (Week 1): Pt will initiate stair training PT Short Term Goal 3 (Week 1): Pt will ambulate x 150 ft with LRAD and CGA consistently  Skilled Therapeutic Interventions/Progress Updates:  Pt received seated in recliner in room, agreeable to PT session. No complaints of pain. Pt performs sit to stand to RW with Supervision to CGA throughout session. Ambulation 2 x 150 ft with RW and min A for balance, decreased RLE step length and stance time as well as decreased fluidity during gait. Ascend/descend 4 x 6" stairs with 2 handrails and min A for balance, step-to gait pattern with increased time needed for LLE management. Pt performed normal TUG with following score with use of RW and min A for balance: 33.4 sec, 27.9 sec, and 27.3 sec for a score of 29.5 sec. Reviewed TUG score and functional implications for high fall risk with score >13.5 sec. Provided home measurement sheet to patient following discussion of her home setup and pt with concerns about equipment and having enough room in her home. Pt returned to room and remained seated in recliner with needs in reach, chair alarm in place at end of session.  Therapy Documentation Precautions:  Precautions Precautions: Fall Precaution Comments: L sided weakness Restrictions Weight Bearing Restrictions: No     Therapy/Group: Individual Therapy   Excell Seltzer, PT, DPT  12/28/2020, 5:23 PM

## 2020-12-28 NOTE — IPOC Note (Signed)
Individualized overall Plan of Care [facility]) Patient Details Name: Adrienne Lane MRN: [id number] DOB: 1966-04-08  Admitting Diagnosis: Right pontine cerebrovascular accident [facility])  Hospital Problems: Principal Problem:   Right pontine cerebrovascular accident [facility]) Active Problems:   Hypokalemia   Labile blood glucose   Poorly controlled type 2 diabetes mellitus with peripheral neuropathy ([place])   Stage 3b chronic kidney disease ([place])     Functional Problem List: Nursing Endurance,Safety,Pain,Skin Integrity  PT Balance,Endurance,Motor,Safety,Sensory  OT Balance,Endurance,Motor,Safety,Vision  SLP    TR         Basic ADL's: OT Eating,Grooming,Bathing,Dressing,Toileting     Advanced  ADL's: OT Simple Meal Preparation,Light Housekeeping,Laundry     Transfers: PT Bed Mobility,Bed to [employer]  OT Toilet,Tub/Shower     Locomotion: PT Ambulation,Wheelchair Mobility,Stairs     Additional Impairments: OT Fuctional Use of Upper Extremity  SLP        TR      Anticipated Outcomes Item Anticipated Outcome  Self Feeding mod I  Swallowing      Basic self-care  Supervision/set up  Toileting  supervision   Bathroom Transfers supervision  Bowel/Bladder     Transfers  Supervision  Locomotion  Supervision with LRAD  Communication     Cognition     Pain  free of pain  Safety/Judgment  pt will be free of falls or injury while in CIR   Therapy Plan: PT Intensity: Minimum of 1-2 x/day ,45 to 90 minutes PT Frequency: 5 out of 7 days PT Duration Estimated Length of Stay: 9-12 days OT Intensity: Minimum of 1-2 x/day, 45 to 90 minutes OT Frequency: 5 out of 7 days OT Duration/Estimated Length of Stay: 9-12 days      Team Interventions: Nursing Interventions Patient/Family Education,Pain Management,Discharge Planning,Disease Management/Prevention  PT interventions Ambulation/gait training,Balance/vestibular training,Cognitive  remediation/compensation,Community reintegration,Discharge planning,Disease management/prevention,DME/adaptive equipment instruction,Functional electrical stimulation,Functional mobility training,Neuromuscular re-education,Pain management,Patient/family education,Splinting/orthotics,Stair training,Therapeutic Activities,Therapeutic Exercise,UE/LE Strength taining/ROM,UE/LE Coordination activities,Visual/perceptual remediation/compensation  OT Interventions Balance/vestibular training,Neuromuscular re-education,Self Care/advanced ADL retraining,Therapeutic Exercise,DME/adaptive equipment instruction,UE/LE Strength taining/ROM,Patient/family education,UE/LE Coordination activities,Discharge planning,Functional mobility training,Therapeutic Activities,Visual/perceptual remediation/compensation  SLP Interventions    TR Interventions    SW/CM Interventions Discharge Planning,Psychosocial Support,Patient/Family Education   Barriers to Discharge MD  Medical stability and Weight  Nursing      PT Home environment access/layout    OT      SLP      SW Other (comments) Uninsured no PCP   Team Discharge Planning: Destination: PT-Home ,OT- Home , SLP-  Projected Follow-up: PT-Outpatient PT, OT-  Outpatient OT, SLP-  Projected Equipment Needs: PT-Rolling walker with 5" wheels, OT- To be determined,Tub/shower bench,3 in 1 bedside comode, SLP-  Equipment Details: PT-TBD pending progress, OT-  Patient/family involved in discharge planning: PT- Patient,  OT-Patient, SLP-   MD ELOS: 7-10 days. Medical Rehab Prognosis:  Good Assessment: 55 year old right-handed female with history of atrial fibrillation RVR maintained on Eliquis, diastolic congestive heart failure, CKD stage III, diabetes mellitus, obesity with BMI 34.06, hypertension, cardiomyopathy.Presented 12/24/2020 with left-sided weakness facial droop and slurred speech. CT/MRI showed acute infarct right hemipons as well as additional small acute  infarct left frontal subcortical white matter. Small remote lacunar infarct in the right corona radiata and left periatrial white matter. Patient did not receive TPA. MRA of the head negative. Echocardiogram with ejection fraction of 45 to 50% without thrombus. Admission chemistries unremarkable except potassium 3.3 glucose 305 BUN 32 creatinine 2.12 urine drug screen negative. Neurology follow-up patient's Eliquis was changed to Pradaxa for CVA prophylaxis as well  as the addition of low-dose aspirin.. Maintain on a regular diet. Due to patient's left-sided weakness facial droop slurred speech and decreased functional mobility she was admitted for a comprehensive rehab program. Will set goals for Supervision with PT/OT.  Due to the current state of emergency, patients may not be receiving their 3-hours of Medicare-mandated therapy.  See Team Conference Notes for weekly updates to the plan of care

## 2020-12-28 NOTE — Progress Notes (Addendum)
Inpatient Rehabilitation Medication Review by a Pharmacist  A complete drug regimen review was completed for this patient to identify any potential clinically significant medication issues.  Clinically significant medication issues were identified:  yes   Type of Medication Issue Identified Description of Issue Urgent (address now) Non-Urgent (address on AM team rounds) Plan Plan Accepted by Provider? (Yes / No / Pending AM Rounds)  Drug Interaction(s) (clinically significant)       Duplicate Therapy       Allergy       No Medication Administration End Date       Incorrect Dose       Additional Drug Therapy Needed  Hydralazine 50 mg TID - last BP 184/69 Urgent Secure chat to Dr. Naaman Plummer Restarted  Other  Did not receive while inpatient but on discharge summary: dapagliflozin, [place], multivitamin Nonurgent Secure chat to Dr. Naaman Plummer Restarted    Name of provider notified for urgent issues identified: Dr. Naaman Plummer  Provider Method of Notification: Secure chat  Pharmacist comments: All issues resolved. Hydralazine restarted at lower dose (25 mg TID) - will continue to monitor BP.   Time spent performing this drug regimen review (minutes):  15 minutes   Adrienne Lane, PharmD PGY1 Pharmacy Resident 12/28/2020 7:48 AM  Please check [email or web address] for unit-specific pharmacy phone numbers.

## 2020-12-28 NOTE — Evaluation (Signed)
Occupational Therapy Assessment and Plan  Patient Details  Name: Adrienne Lane MRN: [id number] Date of Birth: August 30, 1966  OT Diagnosis: hemiplegia affecting non-dominant side and muscle weakness (generalized) Rehab Potential:   ELOS: 9-12 days   Today's Date: 12/28/2020 OT Individual Time:  3559-7416 &  1345-1430 OT Individual Time Calculation (min):   66 min &  45 min     Hospital Problem: Principal Problem:   Right pontine cerebrovascular accident [facility])   Past Medical History:  Past Medical History:  Diagnosis Date  . Atrial fibrillation with RVR ([place])   . [place] 01/2020  . CHF (congestive heart failure) ([place]) 10/20/2019  . CKD (chronic kidney disease), stage IV ([place])   . Diabetes mellitus without complication ([place])   . DOE (dyspnea on exertion)    walking upstairs or up hill resolves in one minute  . Fibroids   . History of kidney stones   . History of recent blood transfusion 02/26/2020  . Hypertension   . Iron deficiency anemia   . Non-ischemic cardiomyopathy ([facility])    tachycardia induced  . Obese   . Peripheral edema   . Premature ventricular contractions (PVCs) ([facility])   . Umbilical hernia   . Wears glasses    Past Surgical History:  Past Surgical History:  Procedure Laterality Date  . CHOLECYSTECTOMY    . CYSTOSCOPY W/ URETERAL STENT PLACEMENT Bilateral 05/24/2020   Procedure: CYSTOSCOPY WITH RETROGRADE PYELOGRAM/URETERAL STENT PLACEMENT;  Surgeon: Robley Fries, MD;  Location: [facility];  Service: Urology;  Laterality: Bilateral;  . CYSTOSCOPY/RETROGRADE/URETEROSCOPY Bilateral 04/03/2020   Procedure: CYSTOSCOPY/RETROGRADE/URETEROSCOPY;  Surgeon: Ardis Hughs, MD;  Location: [facility];  Service: Urology;  Laterality: Bilateral;  . HYSTERECTOMY ABDOMINAL WITH SALPINGO-OOPHORECTOMY  07/21/2020   Procedure: HYSTERECTOMY ABDOMINAL WITH BILATERAL SALPINGO-OOPHORECTOMY;  Surgeon: Sanjuana Kava, MD;  Location: [place];  Service: Gynecology;;  . IR FLUORO GUIDE CV LINE  RIGHT  02/21/2020  . IR US GUIDE VASC ACCESS RIGHT  02/21/2020  . NEPHROLITHOTOMY Left 02/14/2020   Procedure: NEPHROLITHOTOMY PERCUTANEOUS/ SURGEON ACCESS/ LEFT PERCUTANEOUS NEPHROSTOMY TUBE PLACEMENT;  Surgeon: Ardis Hughs, MD;  [facility];  Service: Urology;  Laterality: Left;  . NEPHROLITHOTOMY Left 02/21/2020   Procedure: NEPHROLITHOTOMY PERCUTANEOUS SECOND LOOK;  Surgeon: Ardis Hughs, MD;  [facility];  Service: Urology;  Laterality: Left;  . NEPHROLITHOTOMY Left 02/26/2020   Procedure: NEPHROLITHOTOMY PERCUTANEOUS;  Surgeon: Ceasar Mons, MD;  [facility];  Service: Urology;  Laterality: Left;  NEED 150 MIN  . NEPHROLITHOTOMY Right 03/24/2020   Procedure: NEPHROLITHOTOMY PERCUTANEOUS WITH ACCESS LEFT STENT REMOVAL;  Surgeon: Ardis Hughs, MD;  [facility];  Service: Urology;  Laterality: Right;  . RIGHT HEART CATH N/A 11/09/2019   Procedure: RIGHT HEART CATH;  Surgeon: Jolaine Artist, MD;  Location: [place] [facility];  Service: Cardiovascular;  Laterality: N/A;  . WISDOM TOOTH EXTRACTION      Assessment & Plan Clinical Impression: Patient is a 55 y.o. year old female with history of atrial fibrillation RVR maintained on Eliquis, diastolic congestive heart failure, CKD stage III, diabetes mellitus, obesity with BMI 34.06, hypertension, cardiomyopathy. Per chart review lives with spouse and also mother-in-law and son. Mobile home 5 steps to entry. Reportedly independent prior to admission. Presented 12/24/2020 with left-sided weakness facial droop and slurred speech. CT/MRI showed acute infarct right hemipons as well as additional small acute infarct left frontal subcortical white matter. Small remote lacunar infarct in the right corona radiata and left periatrial white  matter. Patient did not receive TPA. MRA of the head negative. Echocardiogram with ejection fraction of 45 to 50% without thrombus. Admission chemistries unremarkable  except potassium 3.3 glucose 305 BUN 32 creatinine 2.12 urine drug screen negative. Neurology follow-up patient's Eliquis was changed to Pradaxa for CVA prophylaxis as well as the addition of low-dose aspirin.. Maintain on a regular diet. Due to patient's left-sided weakness facial droop slurred speech and decreased functional mobility she was admitted for a comprehensive rehab program.  Patient transferred to CIR on 12/27/2020 .    Patient currently requires mod with basic self-care skills and IADL secondary to decreased cardiorespiratoy endurance, unbalanced muscle activation, decreased coordination and decreased motor planning, hemianopsia and decreased sitting balance, decreased standing balance, decreased postural control, hemiplegia and decreased balance strategies.  Prior to hospitalization, patient could complete ADL/IADL with independent .  Patient will benefit from skilled intervention to decrease level of assist with basic self-care skills, increase independence with basic self-care skills and increase level of independence with iADL prior to discharge home with care partner.  Anticipate patient will require intermittent supervision and follow up outpatient.  OT - End of Session Activity Tolerance: Tolerates 30+ min activity with multiple rests Endurance Deficit: Yes Endurance Deficit Description: fatigue noted with ADL tasks OT Assessment OT Patient demonstrates impairments in the following area(s): Balance;Endurance;Motor;Safety;Vision OT Basic ADL's Functional Problem(s): Eating;Grooming;Bathing;Dressing;Toileting OT Advanced ADL's Functional Problem(s): Simple Meal Preparation;Light Housekeeping;Laundry OT Transfers Functional Problem(s): Toilet;Tub/Shower OT Additional Impairment(s): Fuctional Use of Upper Extremity OT Plan OT Intensity: Minimum of 1-2 x/day, 45 to 90 minutes OT Frequency: 5 out of 7 days OT Duration/Estimated Length of Stay: 9-12 days OT Treatment/Interventions:  Balance/vestibular training;Neuromuscular re-education;Self Care/advanced ADL retraining;Therapeutic Exercise;DME/adaptive equipment instruction;UE/LE Strength taining/ROM;Patient/family education;UE/LE Coordination activities;Discharge planning;Functional mobility training;Therapeutic Activities;Visual/perceptual remediation/compensation OT Self Feeding Anticipated Outcome(s): mod I OT Basic Self-Care Anticipated Outcome(s): Supervision/set up OT Toileting Anticipated Outcome(s): supervision OT Bathroom Transfers Anticipated Outcome(s): supervision OT Recommendation Recommendations for Other Services: Speech consult Patient destination: Home Follow Up Recommendations: Outpatient OT Equipment Recommended: To be determined;Tub/shower bench;3 in 1 bedside comode   OT Evaluation Precautions/Restrictions  Precautions Precautions: Fall Precaution Comments: L sided weakness Restrictions Weight Bearing Restrictions: No General   Vital Signs Therapy Vitals Temp: 98.2 F (36.8 C) Temp Source: Oral Pulse Rate: (!) 47 Resp: 17 BP: (!) 154/68 Patient Position (if appropriate): Sitting Oxygen Therapy SpO2: 100 % O2 Device: Room Air Pain Pain Assessment Pain Scale: 0-10 Pain Score: 0-No pain Home Living/Prior Functioning Home Living Family/patient expects to be discharged to:: Private residence Living Arrangements: Spouse/significant other,Other (Comment) Available Help at Discharge: Family,Available 24 hours/day Type of Home: Mobile home Home Access: Stairs to enter Entrance Stairs-Number of Steps: 5 Entrance Stairs-Rails: Can reach both (hand surface is a wide wooden board) Home Layout: One level Bathroom Shower/Tub: Walk-in shower (large threshold to navigate) Biochemist, clinical: Standard Additional Comments: on disability for > 1 year  Lives With: Son,Family,Spouse Prior Function Level of Independence: Independent with gait,Independent with transfers,Independent with basic  ADLs,Independent with homemaking with ambulation  Able to Take Stairs?: Yes Driving: Yes Vocation: On disability Vision Baseline Vision/History: Wears glasses Wears Glasses: Distance only Patient Visual Report: Blurring of vision Vision Assessment?: Yes Eye Alignment: Within Functional Limits Ocular Range of Motion: Within Functional Limits Alignment/Gaze Preference: Within Defined Limits Tracking/Visual Pursuits: Able to track stimulus in all quads without difficulty Saccades: Within functional limits Convergence: Within functional limits Visual Fields: Left visual field deficit Additional Comments: decreased peripheral vision L side Perception  Perception: Within Functional Limits Praxis Praxis: Intact Cognition Overall Cognitive Status: Within Functional Limits for tasks assessed Arousal/Alertness: Awake/alert Orientation Level: Person;Place;Situation Person: Oriented Place: Oriented Situation: Oriented Year: 2022 Month: January Day of Week: Correct Memory: Appears intact Immediate Memory Recall: Sock;Blue;Bed Memory Recall Sock: Without Cue Memory Recall Blue: Without Cue Memory Recall Bed: Without Cue Attention: Focused;Sustained Focused Attention: Appears intact Sustained Attention: Appears intact Awareness: Appears intact Problem Solving: Appears intact Safety/Judgment: Appears intact Sensation Sensation Light Touch: Appears Intact Hot/Cold: Appears Intact Proprioception: Appears Intact Coordination Gross Motor Movements are Fluid and Coordinated: No Fine Motor Movements are Fluid and Coordinated: No Coordination and Movement Description: impaired 2/2 L mild hemi Finger Nose Finger Test: R = WNL, left significant dysmetria Heel Shin Test: impaired L side 9 Hole Peg Test: R = 22 sec, L = unable.     box and blocks:   R = 47, L = 12 Motor  Motor Motor: Hemiplegia;Abnormal tone;Abnormal postural alignment and control Motor - Skilled Clinical Observations:  mild L hemi  Trunk/Postural Assessment  Cervical Assessment Cervical Assessment: Within Functional Limits Thoracic Assessment Thoracic Assessment: Exceptions to [facility] (rounded shoulders) Lumbar Assessment Lumbar Assessment: Within Functional Limits Postural Control Postural Control: Deficits on evaluation (delayed)  Balance Balance Balance Assessed: Yes Static Sitting Balance Static Sitting - Balance Support: No upper extremity supported;Feet supported Static Sitting - Level of Assistance: 5: Stand by assistance Dynamic Sitting Balance Dynamic Sitting - Balance Support: No upper extremity supported;Feet supported;During functional activity Dynamic Sitting - Level of Assistance: 5: Stand by assistance Static Standing Balance Static Standing - Balance Support: Bilateral upper extremity supported;During functional activity Static Standing - Level of Assistance: 4: Min assist Dynamic Standing Balance Dynamic Standing - Balance Support: Bilateral upper extremity supported;During functional activity Dynamic Standing - Level of Assistance: 3: Mod assist Extremity/Trunk Assessment RUE Assessment RUE Assessment: Within Functional Limits LUE Assessment Passive Range of Motion (PROM) Comments: WFL - proximal tightness Active Range of Motion (AROM) Comments: shoulder 1/2, proximal compensatory techniques, elbow/forearm full, wrist 3/4, full digit flexion, 3/4 digit extension  Care Tool Care Tool Self Care Eating   Eating Assist Level: Set up assist    Oral Care    Oral Care Assist Level: Contact Guard/Toucning assist    Bathing   Body parts bathed by patient: Right arm;Left arm;Chest;Abdomen;Front perineal area;Buttocks;Right upper leg;Left upper leg;Face Body parts bathed by helper: Chest;Abdomen;Buttocks;Right lower leg;Left lower leg;Right arm   Assist Level: Moderate Assistance - Patient 50 - 74%    Upper Body Dressing(including orthotics)   What is the patient wearing?: Bra;Pull  over shirt   Assist Level: Moderate Assistance - Patient 50 - 74%    Lower Body Dressing (excluding footwear)   What is the patient wearing?: Underwear/pull up;Pants Assist for lower body dressing: Maximal Assistance - Patient 25 - 49%    Putting on/Taking off footwear   What is the patient wearing?: Non-skid slipper socks Assist for footwear: Total Assistance - Patient < 25%       Care Tool Toileting Toileting activity   Assist for toileting: Moderate Assistance - Patient 50 - 74%     Care Tool Bed Mobility Roll left and right activity   Roll left and right assist level: Supervision/Verbal cueing    Sit to lying activity   Sit to lying assist level: Contact Guard/Touching assist    Lying to sitting edge of bed activity   Lying to sitting edge of bed assist level: Contact Guard/Touching assist  Care Tool Transfers Sit to stand transfer   Sit to stand assist level: Minimal Assistance - Patient > 75%    Chair/bed transfer   Chair/bed transfer assist level: Minimal Assistance - Patient > 75%     Toilet transfer   Assist Level: Minimal Assistance - Patient > 75%     Care Tool Cognition Expression of Ideas and Wants Expression of Ideas and Wants: Without difficulty (complex and basic) - expresses complex messages without difficulty and with speech that is clear and easy to understand   Understanding Verbal and Non-Verbal Content Understanding Verbal and Non-Verbal Content: Understands (complex and basic) - clear comprehension without cues or repetitions   Memory/Recall Ability *first 3 days only Memory/Recall Ability *first 3 days only: Current season;Location of own room;That he or she is in a hospital/hospital unit    Refer to Care Plan for [place] 1 OT Short Term Goal 1 (Week 1): patient iwll complete bathing and dressing with min A OT Short Term Goal 2 (Week 1): patient will complete toileting with min a OT Short Term Goal 3 (Week  1): patient will complete functional transfers with CGA OT Short Term Goal 4 (Week 1): patient will increase ability to hold and manipulate items with left hand 25% OT Short Term Goal 5 (Week 1): patient will complete basic HM with RW CGA  Recommendations for other services: Other: speech therapy    Skilled Therapeutic Intervention   Patient seated in recliner, alert and denies pain - she notes occ pain in left shoulder with activity.  Reviewed role of OT, plan of care, schedule and safety measures.  Evaluation completed as documented above.  Patient presents with left side weakness, balance deficits and mild visual impairment limiting safety and independence with completing all self care and mobility at this time.  She is an excellent candidate for IP rehab and has demonstrated improvement during therapy sessions today.  Am session with focus on ADL and functional mobility training with good carryover of strategies (see below for status).  She returned to recliner at close of session.  Seat alarm set and call bell in reach.     PM session with focus on left arm mobility and coordination.  Completed NMRE activities, light OH stretch, reviewed positioning and pain control techniques.  Provided hand dexterity activities and strengthening program with theraputty.  Functional transfers this session with RW at [facility] level with good carryover of using left hand for support and push to stand.  She returned to recliner at close of session. Seat alarm set and call bell in reach.   ADL ADL Eating: Set up Where Assessed-Eating: Chair Grooming: Minimal assistance Where Assessed-Grooming: Standing at sink Upper Body Bathing: Moderate assistance Where Assessed-Upper Body Bathing: Shower Lower Body Bathing: Moderate assistance Where Assessed-Lower Body Bathing: Shower Upper Body Dressing: Moderate assistance Where Assessed-Upper Body Dressing: Chair Lower Body Dressing: Maximal assistance Where  Assessed-Lower Body Dressing: Chair Toileting: Moderate assistance Where Assessed-Toileting: Glass blower/designer: Psychiatric nurse Method: Counselling psychologist: Grab bars;Raised Counselling psychologist: Environmental education officer Method: Heritage manager: Grab bars;Transfer tub bench Mobility  Bed Mobility Bed Mobility: Rolling Right;Rolling Left;Supine to Sit;Sit to Supine Rolling Right: Supervision/verbal cueing Rolling Left: Supervision/Verbal cueing Supine to Sit: Contact Guard/Touching assist Sit to Supine: Contact Guard/Touching assist Transfers Sit to Stand: Minimal Assistance - Patient > 75%   Discharge Criteria: Patient will be discharged from OT if  patient refuses treatment 3 consecutive times without medical reason, if treatment goals not met, if there is a change in medical status, if patient makes no progress towards goals or if patient is discharged from hospital.  The above assessment, treatment plan, treatment alternatives and goals were discussed and mutually agreed upon: by patient  Carlos Levering 12/28/2020, 3:58 PM

## 2020-12-28 NOTE — Plan of Care (Signed)
  Problem: Consults Goal: Diabetes Guidelines if Diabetic/Glucose > 140 Description: If diabetic or lab glucose is > 140 mg/dl - Initiate Diabetes/Hyperglycemia Guidelines & Document Interventions  Outcome: Progressing   Problem: RH BOWEL ELIMINATION Goal: RH STG MANAGE BOWEL WITH ASSISTANCE Description: STG Manage Bowel with Assistance. Outcome: Progressing   Problem: RH SKIN INTEGRITY Goal: RH STG SKIN FREE OF INFECTION/BREAKDOWN Outcome: Progressing   Problem: RH SAFETY Goal: RH STG ADHERE TO SAFETY PRECAUTIONS W/ASSISTANCE/DEVICE Description: STG Adhere to Safety Precautions With Assistance/Device. Outcome: Progressing   Problem: RH PAIN MANAGEMENT Goal: RH STG PAIN MANAGED AT OR BELOW PT'S PAIN GOAL Outcome: Progressing   Problem: RH KNOWLEDGE DEFICIT Goal: RH STG INCREASE KNOWLEDGE OF DIABETES Description: Pt will be able to demonstrate use of equipment for diabetic management Pt will have understanding of medication with verbal cues Outcome: Progressing Goal: RH STG INCREASE KNOWLEDGE OF HYPERTENSION Description: Pt will have understanding of effects of htn Will understand medication and diet for htn With verbal cue Outcome: Progressing Goal: RH STG INCREASE KNOWLEGDE OF HYPERLIPIDEMIA Description: Pt will voice understanding with verbal cues the risk of hyperlipidemia medications associated with disease Outcome: Progressing Goal: RH STG INCREASE KNOWLEDGE OF STROKE PROPHYLAXIS Description: Pt will voice understanding of stroke prevention with verbal cues Outcome: Progressing

## 2020-12-28 NOTE — Progress Notes (Signed)
[place] PHYSICAL MEDICINE & REHABILITATION PROGRESS NOTE   Subjective/Complaints: Pt had a reasonable night. Ready to get up with therapy this morning! Husband currently in ED  ROS: Patient denies fever, rash, sore throat, blurred vision, nausea, vomiting, diarrhea, cough, shortness of breath or chest pain, joint or back pain, headache, or mood change.    Objective:   No results found. No results for input(s): WBC, HGB, HCT, PLT in the last 72 hours. Recent Labs    12/26/20 0658 12/27/20 0640  NA 140 139  K 2.7* 3.4*  CL 102 104  CO2 24 23  GLUCOSE 176* 163*  BUN 34* 36*  CREATININE 1.97* 1.81*  CALCIUM 9.4 9.1    Intake/Output Summary (Last 24 hours) at 12/28/2020 0846 Last data filed at 12/28/2020 0734 Gross per 24 hour  Intake 360 ml  Output 400 ml  Net -40 ml        Physical Exam: Vital Signs Blood pressure (!) 184/69, pulse (!) 53, temperature 98.5 F (36.9 C), resp. rate 16, height 5\' 8"  (1.727 m), weight 104.5 kg, SpO2 100 %.  General: Alert and oriented x 3, No apparent distress, obese HEENT: Head is normocephalic, atraumatic, PERRLA, EOMI, sclera anicteric, oral mucosa pink and moist, dentition intact, ext ear canals clear,  Neck: Supple without JVD or lymphadenopathy Heart: Reg rate and rhythm. No murmurs rubs or gallops Chest: CTA bilaterally without wheezes, rales, or rhonchi; no distress Abdomen: Soft, non-tender, non-distended, bowel sounds positive. Extremities: No clubbing, cyanosis, or edema. Pulses are 2+ Skin: Clean and intact without signs of breakdown Neuro: Pt is cognitively appropriate with normal insight, memory, and awareness. Left central 7. Mild dysarthria. Reflexes are 2+ in all 4's. Fine motor coordination is intact. No tremors. Motor function is grossly 5/5 RUE and RLE. LUE 3-4/5 prox to distal with apraxia. LLE  3+ to 4/5. Distal sensory loss in both feet.  Musculoskeletal: Full ROM, No pain with AROM or PROM in the neck, trunk, or  extremities. Posture appropriate Psych: Pt's affect is appropriate. Pt is cooperative    Assessment/Plan: 1. Functional deficits which require 3+ hours per day of interdisciplinary therapy in a comprehensive inpatient rehab setting.  Physiatrist is providing close team supervision and 24 hour management of active medical problems listed below.  Physiatrist and rehab team continue to assess barriers to discharge/monitor patient progress toward functional and medical goals  Care Tool:  Bathing              Bathing assist       Upper Body Dressing/Undressing Upper body dressing        Upper body assist      Lower Body Dressing/Undressing Lower body dressing            Lower body assist       Toileting Toileting    Toileting assist Assist for toileting: Independent     Transfers Chair/bed transfer  Transfers assist           Locomotion Ambulation   Ambulation assist              Walk 10 feet activity   Assist           Walk 50 feet activity   Assist           Walk 150 feet activity   Assist           Walk 10 feet on uneven surface  activity   Assist  Wheelchair     Assist               Wheelchair 50 feet with 2 turns activity    Assist            Wheelchair 150 feet activity     Assist          Blood pressure (!) 184/69, pulse (!) 53, temperature 98.5 F (36.9 C), resp. rate 16, height 5\' 8"  (1.727 m), weight 104.5 kg, SpO2 100 %.  Medical Problem List and Plan: 1.Left side weakness with facial droop/slurred speechsecondary to right pontine lacunar infarction -patient may shower -ELOS/Goals: 9-12 days, supervision PT, OT  -begin therapies today 2. Antithrombotics: -DVT/anticoagulation:Pradaxa -antiplatelet therapy: Aspirin 81 mg daily 3. Pain Management:Tylenol as needed 4. Mood:Provide emotional  support -antipsychotic agents: N/A 5. Neuropsych: This patientiscapable of making decisions on herown behalf. 6. Skin/Wound Care:Routine skin checks 7. Fluids/Electrolytes/Nutrition:Routine in and outs with follow-up chemistries 8. Atrial fibrillation. Cardiac rate controlled and regular. Continue Pradaxa as well as Mexitil 300 mg every 12 hours 9. Hypertension.  Poorly controlled at presentesp SBP  -Coreg 3.125 mg twice daily.    -on hydralazine 100mg  q8 at home. Resume at 25 mg tid to start 1/9 10. Diastolic congestive heart failure. Monitor for any signs of fluid overload 11. CKD stage III. Baseline creatinine 1.55-2.38.   -on demadex 40mg  bid at home  -need daily weights   Filed Weights   12/27/20 1853  Weight: 104.5 kg     -Follow-up chemistries Monday 12. Diabetes mellitus with peripheral neuropathy. Hemoglobin A1c 8.0. Lantus insulin 20 units daily.   -poorly controlled. Took farxiga 10mg  daily at home---resume 1/9  -Checking blood sugars before meals and at bedtime.   -Diabetic teaching 13. Hyperlipidemia. Lipitor 14. Obesity. BMI 34.06. Dietary follow-up    LOS: 1 days A FACE TO FACE EVALUATION WAS PERFORMED  Meredith Staggers 12/28/2020, 8:46 AM

## 2020-12-29 ENCOUNTER — Inpatient Hospital Stay (HOSPITAL_COMMUNITY): Payer: Self-pay

## 2020-12-29 ENCOUNTER — Inpatient Hospital Stay (HOSPITAL_COMMUNITY): Payer: Self-pay | Admitting: Occupational Therapy

## 2020-12-29 DIAGNOSIS — E1142 Type 2 diabetes mellitus with diabetic polyneuropathy: Secondary | ICD-10-CM

## 2020-12-29 DIAGNOSIS — E876 Hypokalemia: Secondary | ICD-10-CM

## 2020-12-29 DIAGNOSIS — R7309 Other abnormal glucose: Secondary | ICD-10-CM

## 2020-12-29 DIAGNOSIS — E1165 Type 2 diabetes mellitus with hyperglycemia: Secondary | ICD-10-CM

## 2020-12-29 DIAGNOSIS — N1832 Chronic kidney disease, stage 3b: Secondary | ICD-10-CM

## 2020-12-29 LAB — CBC WITH DIFFERENTIAL/PLATELET
Abs Immature Granulocytes: 0.02 10*3/uL (ref 0.00–0.07)
Basophils Absolute: 0 10*3/uL (ref 0.0–0.1)
Basophils Relative: 1 %
Eosinophils Absolute: 0.2 10*3/uL (ref 0.0–0.5)
Eosinophils Relative: 4 %
HCT: 36.4 % (ref 36.0–46.0)
Hemoglobin: 12 g/dL (ref 12.0–15.0)
Immature Granulocytes: 0 %
Lymphocytes Relative: 29 %
Lymphs Abs: 1.3 10*3/uL (ref 0.7–4.0)
MCH: 28.6 pg (ref 26.0–34.0)
MCHC: 33 g/dL (ref 30.0–36.0)
MCV: 86.9 fL (ref 80.0–100.0)
Monocytes Absolute: 0.4 10*3/uL (ref 0.1–1.0)
Monocytes Relative: 9 %
Neutro Abs: 2.6 10*3/uL (ref 1.7–7.7)
Neutrophils Relative %: 57 %
Platelets: 175 10*3/uL (ref 150–400)
RBC: 4.19 MIL/uL (ref 3.87–5.11)
RDW: 13.2 % (ref 11.5–15.5)
WBC: 4.6 10*3/uL (ref 4.0–10.5)
nRBC: 0 % (ref 0.0–0.2)

## 2020-12-29 LAB — COMPREHENSIVE METABOLIC PANEL
ALT: 17 U/L (ref 0–44)
AST: 20 U/L (ref 15–41)
Albumin: 2.9 g/dL — ABNORMAL LOW (ref 3.5–5.0)
Alkaline Phosphatase: 75 U/L (ref 38–126)
Anion gap: 9 (ref 5–15)
BUN: 31 mg/dL — ABNORMAL HIGH (ref 6–20)
CO2: 24 mmol/L (ref 22–32)
Calcium: 9.3 mg/dL (ref 8.9–10.3)
Chloride: 106 mmol/L (ref 98–111)
Creatinine, Ser: 1.77 mg/dL — ABNORMAL HIGH (ref 0.44–1.00)
GFR, Estimated: 34 mL/min — ABNORMAL LOW (ref >60)
Glucose, Bld: 125 mg/dL — ABNORMAL HIGH (ref 70–99)
Potassium: 3.4 mmol/L — ABNORMAL LOW (ref 3.5–5.1)
Sodium: 139 mmol/L (ref 135–145)
Total Bilirubin: 0.8 mg/dL (ref 0.3–1.2)
Total Protein: 6.6 g/dL (ref 6.5–8.1)

## 2020-12-29 LAB — GLUCOSE, CAPILLARY
Glucose-Capillary: 117 mg/dL — ABNORMAL HIGH (ref 70–99)
Glucose-Capillary: 172 mg/dL — ABNORMAL HIGH (ref 70–99)
Glucose-Capillary: 113 mg/dL — ABNORMAL HIGH (ref 70–99)
Glucose-Capillary: 160 mg/dL — ABNORMAL HIGH (ref 70–99)

## 2020-12-29 MED ORDER — HYDRALAZINE HCL 50 MG PO TABS
50.0000 mg | ORAL_TABLET | Freq: Three times a day (TID) | ORAL | Status: DC
  Administered 2020-12-29 – 2020-12-31 (×6): 50 mg via ORAL
  Filled 2020-12-29 (×7): qty 1

## 2020-12-29 MED ORDER — POTASSIUM CHLORIDE CRYS ER 20 MEQ PO TBCR
30.0000 meq | EXTENDED_RELEASE_TABLET | Freq: Two times a day (BID) | ORAL | Status: AC
  Administered 2020-12-29 – 2020-12-30 (×2): 30 meq via ORAL
  Filled 2020-12-29 (×2): qty 1

## 2020-12-29 NOTE — Progress Notes (Signed)
Meredith Staggers, MD  Physician  Physical Medicine and Rehabilitation  PMR Pre-admission     Signed  Date of Service:  12/26/2020 1:52 PM      Related encounter: ED to Hosp-Admission (Discharged) from 12/24/2020 in [facility]       Signed         Show:Clear all [x] Manual[x] Template[x] Copied  Added by: [x] Michel Santee, PT   [] Hover for details  PMR Admission Coordinator Pre-Admission Assessment  Patient: Kamerin Axford is an 55 y.o., female MRN: [id number] DOB: 02-23-1966 Height: 5\' 8"  (172.7 cm) (from 07/21/2020) Weight: 101.6 kg (from 10/23/2020)                                                                                                                                                  Insurance Information HMO:     PPO:      PCP:      IPA:      80/20:      OTHER:  PRIMARY: Uninsured      Policy#:       Subscriber:  CM Name:       Phone#:      Fax#:  Pre-Cert#:       Employer:  Benefits:  Phone #:      Name:  Eff. Date:     Deduct:       Out of Pocket Max:       Life Max:   CIR:       SNF:  Outpatient:      Co-Pay:  Home Health:       Co-Pay:  DME:      Co-Pay:  Providers:  SECONDARY:       Policy#:       Phone#:   Development worker, community:       Phone#:   The Engineer, petroleum" for patients in Inpatient Rehabilitation Facilities with attached "Privacy Act [place] Records" was provided and verbally reviewed with: N/A  Emergency Contact Information         Contact Information    Name Relation Home Work [place], [place] Spouse [phone]  [phone]   Chyan, [place] Daughter   [phone]     Current Medical History  Patient Admitting Diagnosis: CVA History of Present Illness: MichelleVereenis a 55 year old right-handed female with history of atrial fibrillation RVR maintained on Eliquis, diastolic congestive heart failure, CKD stage III, diabetes mellitus, obesity  with BMI 34.06, hypertension, cardiomyopathy. Presented 12/24/2020 with left-sided weakness facial droop and slurred speech. CT/MRI showed acute infarct right hemipons as well as additional small acute infarct left frontal subcortical white matter. Small remote lacunar infarct in the right corona radiata and left periatrial white matter. Patient did not receive TPA. MRA of the head negative. Echocardiogram with ejection fraction of 45 to  50% without thrombus. Admission chemistries unremarkable except potassium 3.3 glucose 305 BUN 32 creatinine 2.12 urine drug screen negative. Neurology follow-up patient's Eliquis was changed to Pradaxa for CVA prophylaxis as well as the addition of low-dose aspirin.. Maintain on a regular diet. Due to patient's left-sided weakness facial droop slurred speech and decreased functional mobility she was recommended for a comprehensive rehab program.  Complete NIHSS TOTAL: 5 Glasgow Coma Scale Score: 15  Past Medical History      Past Medical History:  Diagnosis Date  . Atrial fibrillation with RVR ([place])   . [place] 01/2020  . CHF (congestive heart failure) ([place]) 10/20/2019  . CKD (chronic kidney disease), stage IV ([place])   . Diabetes mellitus without complication ([place])   . DOE (dyspnea on exertion)    walking upstairs or up hill resolves in one minute  . Fibroids   . History of kidney stones   . History of recent blood transfusion 02/26/2020  . Hypertension   . Iron deficiency anemia   . Non-ischemic cardiomyopathy ([facility])    tachycardia induced  . Obese   . Peripheral edema   . Premature ventricular contractions (PVCs) ([facility])   . Umbilical hernia   . Wears glasses     Family History  family history includes Atrial fibrillation in her mother; Diabetes in her brother, brother, father, mother, and sister; Heart attack in her brother; Hypertension in her brother, father, mother, and sister; Stroke (age of onset: 44) in her maternal  grandmother; Stroke (age of onset: 67) in her mother.  Prior Rehab/Hospitalizations:  Has the patient had prior rehab or hospitalizations prior to admission? Yes  Has the patient had major surgery during 100 days prior to admission? No  Current Medications   Current Facility-Administered Medications:  .   stroke: mapping our early stages of recovery book, , Does not apply, Once, Karmen Bongo, MD .  acetaminophen (TYLENOL) tablet 650 mg, 650 mg, Oral, Q4H PRN **OR** acetaminophen (TYLENOL) 160 MG/5ML solution 650 mg, 650 mg, Per Tube, Q4H PRN **OR** acetaminophen (TYLENOL) suppository 650 mg, 650 mg, Rectal, Q4H PRN, Karmen Bongo, MD .  aspirin chewable tablet 81 mg, 81 mg, Oral, Daily, Karmen Bongo, MD, 81 mg at 12/26/20 2035 .  atorvastatin (LIPITOR) tablet 80 mg, 80 mg, Oral, QPM, Karmen Bongo, MD, 80 mg at 12/25/20 1709 .  carvedilol (COREG) tablet 3.125 mg, 3.125 mg, Oral, BID WC, Hall, Carole N, DO, 3.125 mg at 12/26/20 5974 .  dabigatran (PRADAXA) capsule 150 mg, 150 mg, Oral, Q12H, Wendee Beavers, RPH, 150 mg at 12/26/20 1638 .  feeding supplement (GLUCERNA SHAKE) (GLUCERNA SHAKE) liquid 237 mL, 237 mL, Oral, BID BM, Hall, Carole N, DO, 237 mL at 12/26/20 0821 .  insulin aspart (novoLOG) injection 0-15 Units, 0-15 Units, Subcutaneous, TID WC, Karmen Bongo, MD, 15 Units at 12/26/20 1139 .  insulin glargine (LANTUS) injection 20 Units, 20 Units, Subcutaneous, Q24H, Karmen Bongo, MD, 20 Units at 12/26/20 0636 .  mexiletine (MEXITIL) capsule 300 mg, 300 mg, Oral, Q12H, Karmen Bongo, MD, 300 mg at 12/26/20 4536 .  potassium chloride SA (KLOR-CON) CR tablet 40 mEq, 40 mEq, Oral, BID, Irene Pap N, DO, 40 mEq at 12/26/20 1139 .  senna-docusate (Senokot-S) tablet 1 tablet, 1 tablet, Oral, QHS PRN, Karmen Bongo, MD  Patients Current Diet:     Diet Order                  Diet heart healthy/carb modified Room service appropriate? Yes;  Fluid consistency: Thin   Diet effective ____                  Precautions / Restrictions Precautions Precautions: Fall,Other (comment) Precaution Comments: L sided weakness Restrictions Weight Bearing Restrictions: No   Has the patient had 2 or more falls or a fall with injury in the past year?Yes  Prior Activity Level Community (5-7x/wk): states she got out of house daily, even just to talk, no DME used at baseline, still driving  Prior Functional Level Prior Function Level of Independence: Independent  Self Care: Did the patient need help bathing, dressing, using the toilet or eating?  Independent  Indoor Mobility: Did the patient need assistance with walking from room to room (with or without device)? Independent  Stairs: Did the patient need assistance with internal or external stairs (with or without device)? Independent  Functional Cognition: Did the patient need help planning regular tasks such as shopping or remembering to take medications? Independent  Home Assistive Devices / Equipment Home Assistive Devices/Equipment: None Home Equipment: None  Prior Device Use: Indicate devices/aids used by the patient prior to current illness, exacerbation or injury? None of the above  Current Functional Level Cognition  Overall Cognitive Status: Within Functional Limits for tasks assessed Orientation Level: Oriented X4 General Comments: pt scored 0/28 on Short Blessed Test screener to assess cognition, she demonstrated normal functioning with memory, problem solving, working memory    Extremity Assessment (includes Sensation/Coordination)  Upper Extremity Assessment: LUE deficits/detail LUE Deficits / Details: overall 2/5; pt with poor functional use of LUE;sensation in tact, decreased fine motor skills and limited rapid alternating movements, decreased gross motor coordination;pt demonstrates limitations with motor planning LUE Sensation: WNL LUE Coordination: decreased fine  motor,decreased gross motor  Lower Extremity Assessment: RLE deficits/detail,LLE deficits/detail RLE Deficits / Details: WNL RLE Sensation: WNL RLE Coordination: WNL LLE Deficits / Details: ankle dorsiflexion 4/5, quad 4/5, hip flexor 2/5 LLE Sensation: decreased proprioception LLE Coordination: decreased gross motor    ADLs  Overall ADL's : Needs assistance/impaired Eating/Feeding: Set up,Sitting Grooming: Set up,Sitting Upper Body Bathing: Minimal assistance,Sitting Lower Body Bathing: Minimal assistance,Sit to/from stand Upper Body Dressing : Minimal assistance,Sitting Lower Body Dressing: Minimal assistance,Sit to/from stand Toilet Transfer: Minimal Warden/ranger Details (indicate cue type and reason): short distances due to decrease endurance in LLE Toileting- Clothing Manipulation and Hygiene: Minimal assistance,Sit to/from stand Functional mobility during ADLs: Minimal assistance,Rolling walker General ADL Comments: pt with decreased activity tolerance, decreased endurance, Lsided weakness and decreased coordination    Mobility  Overal bed mobility: Needs Assistance Bed Mobility: Supine to Sit,Sit to Supine Supine to sit: HOB elevated,Min assist Sit to supine: HOB elevated,Min assist General bed mobility comments: MinA to manage L LE for getting in/out of bed, much more fatigued this afternoon    Transfers  Overall transfer level: Needs assistance Equipment used: Rolling walker (2 wheeled) Transfer via Lift Equipment: Stedy Transfers: Sit to/from [employer] to Stand: Min guard Stand pivot transfers: Total assist General transfer comment: MinA to boost up to standing position and gain balance with RW as well as to place L UE on walker and get a decent grip    Ambulation / Gait / Stairs / Wheelchair Mobility  Ambulation/Gait Ambulation/Gait assistance: Counsellor (Feet): 120 Feet Assistive device: Rolling walker (2  wheeled) Gait Pattern/deviations: Step-through pattern,Decreased step length - right,Decreased step length - left,Decreased stride length,Narrow base of support,Trunk flexed General Gait Details: slow and steady with RW, no  signficant balance deficits with BUE support, but stil wtih mild L sided weakness functionally Gait velocity: decreased Stairs: Yes Stairs assistance: Min assist Stair Management: One rail Right,Step to pattern,Forwards Number of Stairs: 2 General stair comments: significant difficulty with steps- needed MinA for balance/safety and Max cues for proper sequencing and technique. Very fatigued after only 2 steps.    Posture / Balance Dynamic Sitting Balance Sitting balance - Comments: statically Balance Overall balance assessment: Needs assistance Sitting-balance support: Bilateral upper extremity supported,Feet supported Sitting balance-Leahy Scale: Good Sitting balance - Comments: statically Postural control: Posterior lean Standing balance support: No upper extremity supported,During functional activity Standing balance-Leahy Scale: Fair Standing balance comment: able to perform dynamic balance tasks/reaching with min guard/wide BOS and no UE support    Special needs/care consideration Diabetic management yes     Previous Home Environment (from acute therapy documentation) Living Arrangements: Spouse/significant other,Other (Comment) (also mother in law, and son) Available Help at Discharge: Family,Available 24 hours/day Type of Home: Mobile home Home Layout: One level Home Access: Stairs to enter Entrance Stairs-Rails: Can reach both Entrance Stairs-Number of Steps: 5 Bathroom Shower/Tub: Walk-in shower (two steps up to get into shower) Biochemist, clinical: [place]: No 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 Living Setting: Patient's home Type of Home at Discharge: Mobile  home Discharge Home Layout: One level Discharge Home Access: Stairs to enter Entrance Stairs-Rails: Can reach both (states the rails are wooden and several inches wide, hard to grasp) Entrance Stairs-Number of Steps: 5 Discharge Bathroom Shower/Tub: Tub/shower unit Discharge Bathroom Toilet: Standard Discharge Bathroom Accessibility: No Does the patient have any problems obtaining your medications?: Yes (Describe) (uninsured)  Social/Family/Support Systems Patient Roles: Spouse,Parent Anticipated Caregiver: Lakhia Gengler (spouse), as well as pt's son and MIL Anticipated Caregiver's Contact Information: Evangeline Gula [phone] Ability/Limitations of Caregiver: supervision/min assist Caregiver Availability: 24/7 Discharge Plan Discussed with Primary Caregiver: Yes Is Caregiver In Agreement with Plan?: Yes Does Caregiver/Family have Issues with Lodging/Transportation while Pt is in Rehab?: No   Goals Patient/Family Goal for Rehab: PT/OT supervision to min assist, SLP n/a Expected length of stay: 9-12 days Additional Information: referred to [facility] for medicaid screen Pt/Family Agrees to Admission and willing to participate: Yes Program Orientation Provided & Reviewed with Pt/Caregiver Including Roles  & Responsibilities: Yes  Barriers to Discharge: Insurance for SNF coverage   Decrease burden of Care through IP rehab admission: n/a   Possible need for SNF placement upon discharge:Not anticipated   Patient Condition: This patient's condition remains as documented in the consult dated 12/25/20, in which the Rehabilitation Physician determined and documented that the patient's condition is appropriate for intensive rehabilitative care in an inpatient rehabilitation facility. Will admit to inpatient rehab Saturday 12/27/20.  Preadmission Screen Completed By:  Michel Santee, PT, 12/26/2020 1:52 PM ______________________________________________________________________   Discussed  status with Dr. Naaman Plummer on 12/26/20 at 1:54 PM  and received approval for admission today.  Admission Coordinator:  Michel Santee, PT, DPT time 1:54 PM Sudie Grumbling 12/26/20              Revision History                        Note Details  Author Meredith Staggers, MD File Time 12/27/2020 11:59 AM  Author Type Physician Status Signed  Last Editor Meredith Staggers, MD Service Physical Medicine and [place] # [id number] Admit Date  12/27/2020    

## 2020-12-29 NOTE — Progress Notes (Signed)
Physical Therapy Session Note  Patient Details  Name: Adrienne Lane MRN: [id number] Date of Birth: 02/22/1966  Today's Date: 12/29/2020 PT Individual Time: 0800-0855 PT Individual Time Calculation (min): 55 min   Short Term Goals: Week 1:  PT Short Term Goal 1 (Week 1): Pt will complete least restrictive transfer with CGA consistently PT Short Term Goal 2 (Week 1): Pt will initiate stair training PT Short Term Goal 3 (Week 1): Pt will ambulate x 150 ft with LRAD and CGA consistently  Skilled Therapeutic Interventions/Progress Updates:   Received pt supine in bed, pt agreeable to therapy, and denied any pain during session. Session with emphasis on dressing, functional mobility/transfers, generalized strengthening, dynamic standing balance/coordination, ambulation, NMR, and improved activity tolerance. Pt transferred supine<>sitting EOB with HOB elevated and use of bedrails with supervision. Sitting EOB, donned underwear with min A and pants with mod A and pt transferred sit<>stand with RW and min A and required mod A to pull underwear/pants over hips on L side. Pt transferred stand<>pivot bed<>recliner with RW and CGA and doffed dirty gown with min A. Donned bra with max A and pull over shirt with min A as pt able to get LUE through arm hole. Pt ambulated 24ft with RW and CGA to sink and stood and brushed teeth and washed face with close supervision. Pt reported urge to use restroom and ambulated 59ft with RW and CGA and required mod A for clothing management on L side. Pt able to void and perform hygiene management with supervision. Pt ambulated 65ft with RW and CGA to sink and washed hands standing with supervision. Pt politely declined ambulating to gym due to fatigue and transported to therapy gym in [facility] total A for time management and energy conservation purposes and MD present for morning rounds. Pt ambulated 155ft with RW and CGA fading to min A with fatigue. Pt demonstrated decreased L foot  clearance and decreased weight shifting to LLE with gait. Pt performed alternating toe taps to 6in step with mod handheld assist 2x10 reps with emphasis on weight shifting to L and motor control of LLE. Pt with 1 posterior LOB onto mat due to dififculty lowering LLE from step. Transitioned to LLE toe taps to 3 colored dots on 6in step with emphasis on L hip flexion and motor control/sequencing 2x10. Stand<>pivot mat<>WC without AD and min A. Pt transported back to room in [facility] total A and ambulated 32ft with handheld assist to recliner with min A. Concluded session with pt sitting in recliner, needs within reach, and chair pad alarm on.  Therapy Documentation Precautions:  Precautions Precautions: Fall Precaution Comments: L sided weakness Restrictions Weight Bearing Restrictions: No  Therapy/Group: Individual Therapy Alfonse Alpers PT, DPT   12/29/2020, 7:24 AM

## 2020-12-29 NOTE — Progress Notes (Signed)
Inpatient Rehabilitation  Patient information reviewed and entered into eRehab system by Demitrious Mccannon M. Sahvanna Mcmanigal, M.A., CCC/SLP, PPS Coordinator.  Information including medical coding, functional ability and quality indicators will be reviewed and updated through discharge.    

## 2020-12-29 NOTE — Progress Notes (Signed)
Physical Therapy Session Note  Patient Details  Name: Adrienne Lane MRN: [id number] Date of Birth: 21-Aug-1966  Today's Date: 12/29/2020 PT Individual Time: 1030-1130 PT Individual Time Calculation (min): 60 min   Short Term Goals: Week 1:  PT Short Term Goal 1 (Week 1): Pt will complete least restrictive transfer with CGA consistently PT Short Term Goal 2 (Week 1): Pt will initiate stair training PT Short Term Goal 3 (Week 1): Pt will ambulate x 150 ft with LRAD and CGA consistently  Skilled Therapeutic Interventions/Progress Updates:    Patient in recliner and reports worked on balance on L leg this morning in PT.  Patient performed sit to stand with min A.  Ambulated 150' with RW and cues for R hip stance stability.  Performed sit to stand with R foot on 6" step and mat elevated assist for hands on L knee for sit to stand forced use on L min A and cues x 8, then with both feet on the floor performed sit to stand still focus on using L x 3 with min A.  Patient requesting to toilet.  Ambulated to room with RW and CGA 150' toileting with min A.  Patient returned to gym ambulating then in parallel bars stepping over cones focus on foot clearance and L LE stance stability with hip extension.  Seated rest with L hamstring curls with orange t-band.  Stepping over cones with 1 UE support and cues.  Standing in parallel bars for side stepping over cones after demonstration and cues for foot placement.  Patient ambulated with QC and min A cues for sequencing and for L hip extension in stance.  Patient ambulated to room with RW and CGA.  Left seated in recliner with chair alarm active and all needs in reach.   Therapy Documentation Precautions:  Precautions Precautions: Fall Precaution Comments: L sided weakness Restrictions Weight Bearing Restrictions: No Pain: Pain Assessment Pain Scale: 0-10 Pain Score: 0-No pain    Therapy/Group: Individual Therapy  Reginia Naas  [place],  PT 12/29/2020, 11:03 AM

## 2020-12-29 NOTE — Progress Notes (Signed)
[facility] PHYSICAL MEDICINE & REHABILITATION PROGRESS NOTE   Subjective/Complaints: Patient seen sitting up, working with therapy this morning.  She states she slept well overnight.  She has questions regarding DME at discharge due to lack of insurance.  She states he had good first day of therapies yesterday.  ROS: Denies CP, SOB, N/V/D  Objective:   No results found. Recent Labs    12/29/20 0603  WBC 4.6  HGB 12.0  HCT 36.4  PLT 175   Recent Labs    12/27/20 0640 12/29/20 0603  NA 139 139  K 3.4* 3.4*  CL 104 106  CO2 23 24  GLUCOSE 163* 125*  BUN 36* 31*  CREATININE 1.81* 1.77*  CALCIUM 9.1 9.3    Intake/Output Summary (Last 24 hours) at 12/29/2020 1253 Last data filed at 12/29/2020 6160 Gross per 24 hour  Intake 720 ml  Output -  Net 720 ml        Physical Exam: Vital Signs Blood pressure (!) 175/50, pulse 67, temperature 97.7 F (36.5 C), temperature source Oral, resp. rate 18, height 5\' 8"  (1.727 m), weight 103.6 kg, SpO2 91 %. Constitutional: No distress . Vital signs reviewed. HENT: Normocephalic.  Atraumatic. Eyes: EOMI. No discharge. Cardiovascular: No JVD.  RRR. Respiratory: Normal effort.  No stridor.  Bilateral clear to auscultation. GI: Non-distended.  BS +. Skin: Warm and dry.  Intact. Psych: Normal mood.  Normal behavior. Musc: No edema in extremities.  No tenderness in extremities. Neuro: Alert Dysarthria Motor: RUE/RLE: 5/5 proximal distal LUE: Shoulder abduction 3/5, distally 4-4+/5 with apraxia LLE: 4+/5 proximal distal   Assessment/Plan: 1. Functional deficits which require 3+ hours per day of interdisciplinary therapy in a comprehensive inpatient rehab setting.  Physiatrist is providing close team supervision and 24 hour management of active medical problems listed below.  Physiatrist and rehab team continue to assess barriers to discharge/monitor patient progress toward functional and medical goals  Care Tool:  Bathing     Body parts bathed by patient: Right arm,Left arm,Chest,Abdomen,Front perineal area,Buttocks,Right upper leg,Left upper leg,Face   Body parts bathed by helper: Chest,Abdomen,Buttocks,Right lower leg,Left lower leg,Right arm     Bathing assist Assist Level: Moderate Assistance - Patient 50 - 74%     Upper Body Dressing/Undressing Upper body dressing   What is the patient wearing?: Bra,Pull over shirt    Upper body assist Assist Level: Moderate Assistance - Patient 50 - 74%    Lower Body Dressing/Undressing Lower body dressing      What is the patient wearing?: Underwear/pull up,Pants     Lower body assist Assist for lower body dressing: Maximal Assistance - Patient 25 - 49%     Toileting Toileting    Toileting assist Assist for toileting: Minimal Assistance - Patient > 75%     Transfers Chair/bed transfer  Transfers assist     Chair/bed transfer assist level: Minimal Assistance - Patient > 75%     Locomotion Ambulation   Ambulation assist      Assist level: Minimal Assistance - Patient > 75% Assistive device: Walker-rolling Max distance: 165ft   Walk 10 feet activity   Assist     Assist level: Minimal Assistance - Patient > 75% Assistive device: Walker-rolling   Walk 50 feet activity   Assist    Assist level: Minimal Assistance - Patient > 75% Assistive device: Walker-rolling    Walk 150 feet activity   Assist    Assist level: Minimal Assistance - Patient > 75% Assistive device:  Walker-rolling    Walk 10 feet on uneven surface  activity   Assist Walk 10 feet on uneven surfaces activity did not occur: Safety/medical concerns         Wheelchair     Assist Will patient use wheelchair at discharge?: No             Wheelchair 50 feet with 2 turns activity    Assist            Wheelchair 150 feet activity     Assist          Medical Problem List and Plan: 1.Left side weakness with facial  droop/slurred speechsecondary to right pontine lacunar infarction  Continue CIR  2. Antithrombotics: -DVT/anticoagulation:Pradaxa -antiplatelet therapy: Aspirin 81 mg daily 3. Pain Management:Tylenol as needed 4. Mood:Provide emotional support -antipsychotic agents: N/A 5. Neuropsych: This patientiscapable of making decisions on herown behalf. 6. Skin/Wound Care:Routine skin checks 7. Fluids/Electrolytes/Nutrition:Routine in and outs. 8. Atrial fibrillation. Cardiac rate controlled and regular. Continue Pradaxa as well as Mexitil 300 mg every 12 hours 9. Hypertension.    -Coreg 3.125 mg twice daily.    Hydralazine increased to 50 3 times daily on 1/10 (100 3 times daily PTA) 10. Diastolic congestive heart failure. Monitor for any signs of fluid overload 11. CKD stage III. Baseline creatinine 1.55-2.38.   -on demadex 40mg  bid at home  -need daily weights   Filed Weights   12/27/20 1853 12/29/20 0557  Weight: 104.5 kg 103.6 kg   Creatinine 1.77 on 1/10  Continue to monitor 12. Diabetes mellitus with peripheral neuropathy. Hemoglobin A1c 8.0.   Lantus insulin 20 units daily.   Farxiga 10mg  daily at home, resumed on 1/9  Slightly labile on 1/10   Monitor with increased mobility 13. Hyperlipidemia. Lipitor 14. Obesity. BMI 34.06. Dietary follow-up 15.  Hypokalemia  Potassium 3.4 on 1/10  Supplemented x1 day  Continue to monitor  LOS: 2 days A FACE TO FACE EVALUATION WAS PERFORMED  Lanny Donoso Lorie Phenix 12/29/2020, 12:53 PM

## 2020-12-29 NOTE — Progress Notes (Signed)
Inpatient Rehabilitation Care Coordinator Assessment and Plan Patient Details  Name: Adrienne Lane MRN: [id number] Date of Birth: Feb 19, 1966  Today's Date: 12/29/2020  Hospital Problems: Principal Problem:   Right pontine cerebrovascular accident [facility])  Past Medical History:  Past Medical History:  Diagnosis Date  . Atrial fibrillation with RVR ([place])   . [place] 01/2020  . CHF (congestive heart failure) ([place]) 10/20/2019  . CKD (chronic kidney disease), stage IV ([place])   . Diabetes mellitus without complication ([place])   . DOE (dyspnea on exertion)    walking upstairs or up hill resolves in one minute  . Fibroids   . History of kidney stones   . History of recent blood transfusion 02/26/2020  . Hypertension   . Iron deficiency anemia   . Non-ischemic cardiomyopathy ([facility])    tachycardia induced  . Obese   . Peripheral edema   . Premature ventricular contractions (PVCs) ([facility])   . Umbilical hernia   . Wears glasses    Past Surgical History:  Past Surgical History:  Procedure Laterality Date  . CHOLECYSTECTOMY    . CYSTOSCOPY W/ URETERAL STENT PLACEMENT Bilateral 05/24/2020   Procedure: CYSTOSCOPY WITH RETROGRADE PYELOGRAM/URETERAL STENT PLACEMENT;  Surgeon: Robley Fries, MD;  Location: [facility];  Service: Urology;  Laterality: Bilateral;  . CYSTOSCOPY/RETROGRADE/URETEROSCOPY Bilateral 04/03/2020   Procedure: CYSTOSCOPY/RETROGRADE/URETEROSCOPY;  Surgeon: Ardis Hughs, MD;  Location: [facility];  Service: Urology;  Laterality: Bilateral;  . HYSTERECTOMY ABDOMINAL WITH SALPINGO-OOPHORECTOMY  07/21/2020   Procedure: HYSTERECTOMY ABDOMINAL WITH BILATERAL SALPINGO-OOPHORECTOMY;  Surgeon: Sanjuana Kava, MD;  Location: [facility];  Service: Gynecology;;  . IR FLUORO GUIDE CV LINE RIGHT  02/21/2020  . IR US GUIDE VASC ACCESS RIGHT  02/21/2020  . NEPHROLITHOTOMY Left 02/14/2020   Procedure: NEPHROLITHOTOMY PERCUTANEOUS/ SURGEON ACCESS/ LEFT PERCUTANEOUS NEPHROSTOMY TUBE PLACEMENT;  Surgeon:  Ardis Hughs, MD;  Location: [facility];  Service: Urology;  Laterality: Left;  . NEPHROLITHOTOMY Left 02/21/2020   Procedure: NEPHROLITHOTOMY PERCUTANEOUS SECOND LOOK;  Surgeon: Ardis Hughs, MD;  Location: [facility];  Service: Urology;  Laterality: Left;  . NEPHROLITHOTOMY Left 02/26/2020   Procedure: NEPHROLITHOTOMY PERCUTANEOUS;  Surgeon: Ceasar Mons, MD;  Location: [facility];  Service: Urology;  Laterality: Left;  NEED 150 MIN  . NEPHROLITHOTOMY Right 03/24/2020   Procedure: NEPHROLITHOTOMY PERCUTANEOUS WITH ACCESS LEFT STENT REMOVAL;  Surgeon: Ardis Hughs, MD;  Location: [facility];  Service: Urology;  Laterality: Right;  . RIGHT HEART CATH N/A 11/09/2019   Procedure: RIGHT HEART CATH;  Surgeon: Jolaine Artist, MD;  Location: [facility];  Service: Cardiovascular;  Laterality: N/A;  . WISDOM TOOTH EXTRACTION     Social History:  reports that she has never smoked. She has never used smokeless tobacco. She reports that she does not drink alcohol and does not use drugs.  Family / Support Systems Marital Status: Married Patient Roles: Spouse,Parent Spouse/Significant Other: Darren-husband [phone] Children: 66 yo son lives with them and Novella-daughter [phone]-cell Other Supports: Mother in-law Anticipated Caregiver: Darren and other family members Ability/Limitations of Caregiver: Supervision-min level Caregiver Availability: 24/7 (Husband is disabled due to cardiac issues but can assist pt at home) Family Dynamics: Close knit family who are willing to assist with pt's care. She is close with her children, they moved here from [place] to be closer to family and grandchildren  Social History Preferred language: English Religion: Pentecostal Cultural Background: No issues Education: HS Read: Yes Write: Yes Employment Status: Unemployed Date Retired/Disabled/Unemployed: applied for SSD, due to  health issues Legal History/Current Legal Issues: Has  [employer] law firm involved to assist with SSD approval. Guardian/Conservator: None-according to MD pt is capable of making her own decisions while here   Abuse/Neglect Abuse/Neglect Assessment Can Be Completed: Yes Physical Abuse: Denies Verbal Abuse: Denies Sexual Abuse: Denies Exploitation of patient/patient's resources: Denies Self-Neglect: Denies  Emotional Status Pt's affect, behavior and adjustment status: Pt is motivated to do well and is pleased with how well she has improved since coming into the hospital. She has always been independent and taken care of herself and others. She hopes to get back to this Recent Psychosocial Issues: other health issues-cardiac, kidney stones, etc Psychiatric History: No history deferred depression screen due to coping appropraitely and makking progress already. May benefit from seeing neuro-psych while here due to young age Substance Abuse History: No issues  Patient / Family Perceptions, Expectations & Goals Pt/Family understanding of illness & functional limitations: Pt and husband have a good understanding of her CVA and deficits. Both have spoken with the MD and feel they have a good understanding of her plan going forward. Premorbid pt/family roles/activities: Wife, mother, grandmother, friend, church member, etc Anticipated changes in roles/activities/participation: resume Pt/family expectations/goals: Pt states: " I hope to be able to take care of myself by the time I leave here."  Husband states: " I will assist but she wants to do it on her own."  [employer]: Other (Comment) ([place] working on her disability) Premorbid Home Care/DME Agencies: None Transportation available at discharge: Family Resource referrals recommended: Neuropsychology  Discharge Planning Living Arrangements: Spouse/significant other,Children,Other relatives Support Systems: Spouse/significant other,Children,Other  relatives,Friends/neighbors,Church/faith community Type of Residence: Private residence Administrator, sports: Government social research officer (Medicaid pending) Financial Resources: Family Support Financial Screen Referred: Previously completed Living Expenses: Lives with family Money Management: Spouse Does the patient have any problems obtaining your medications?: Yes (Describe) (uninsured no PCP) Home Management: Self and husband Patient/Family Preliminary Plans: Return home with husband who is retired/disabled and can assist her if needed. Pt's mother in-law and son are also there at home with them. Team evaluated you and set goals of supervision. Will work on obtaining PCP prior to discharge. Care Coordinator Barriers to Discharge: Other (comments) Care Coordinator Barriers to Discharge Comments: Uninsured no PCP Care Coordinator Anticipated Follow Up Needs: HH/OP  Clinical Impression Pleasant female who is doing well and making good progress since CVA. She is hopeful she will do well and makes good progress here. Will work on obtaining PCP and any discharge needs. Pt has good family support.  Elease Hashimoto 12/29/2020, 9:29 AM

## 2020-12-29 NOTE — Progress Notes (Signed)
Courtney Heys, MD  Physician  Physical Medicine and Rehabilitation  Consult Note     Signed  Date of Service:  12/25/2020 11:31 AM      Related encounter: ED to Hosp-Admission (Discharged) from 12/24/2020 in [facility] MED/SURG UNIT       Signed      Expand All Collapse All    Show:Clear all [x] Manual[x] Template[] Copied  Added by: [x] Angiulli, Lavon Paganini, PA-C[x] Lovorn, Jinny Blossom, MD   [] Hover for details       Physical Medicine and Rehabilitation Consult Reason for Consult: Left side weakness with facial droop and slurred speech Referring Physician: [facility]   HPI: Adrienne Lane is a 55 y.o. right-handed female with history of A. fib with RVR maintained on Eliquis, diastolic congestive heart failure, CKD stage III, diabetes mellitus, obesity, hypertension and cardiomyopathy.  Per chart review lives with spouse also mother-in-law and son.  Mobile home 5 steps to entry.  Reportedly independent prior to admission.  Presented 12/25/2019 with left-sided weakness facial droop and slurred speech.  CT/MRI showed acute infarct right hemipons as well as additional small acute infarct left frontal subcortical white matter.  Small remote lacunar infarct in the right corona radiata and left periatrial white matter.  Patient did not receive TPA.  MRA of the head negative.  Echocardiogram pending.  Admission chemistries unremarkable except potassium 3.3 glucose 305 BUN 32 creatinine 2.12, urine drug screen negative.  Patient currently remains on Eliquis prior to admission with the addition of low-dose aspirin.  Maintain on a regular consistency diet.  Therapy evaluations completed with recommendations of physical medicine rehab consult.   Pt reports no pain; LBM this AM- no constipation- voiding well.  Sleeping OK.  Has not been working (no insurance per pt) due to Afib, CHF< etc.  Came into hospital due to drooling/facial droop and L sided weakness.  Review of Systems   Constitutional: Negative for chills and fever.  HENT: Negative for hearing loss.   Eyes: Negative for blurred vision and double vision.  Respiratory: Negative for cough and shortness of breath.   Cardiovascular: Positive for palpitations and leg swelling. Negative for chest pain.  Gastrointestinal: Positive for constipation. Negative for heartburn, nausea and vomiting.  Genitourinary: Negative for dysuria, flank pain and hematuria.  Musculoskeletal: Positive for joint pain and myalgias.  Skin: Negative for rash.  All other systems reviewed and are negative.      Past Medical History:  Diagnosis Date  . Atrial fibrillation with RVR ([place])   . [place] 01/2020  . CHF (congestive heart failure) ([place]) 10/20/2019  . CKD (chronic kidney disease), stage IV ([place])   . Diabetes mellitus without complication ([place])   . DOE (dyspnea on exertion)    walking upstairs or up hill resolves in one minute  . Fibroids   . History of kidney stones   . History of recent blood transfusion 02/26/2020  . Hypertension   . Iron deficiency anemia   . Non-ischemic cardiomyopathy ([facility])    tachycardia induced  . Obese   . Peripheral edema   . Premature ventricular contractions (PVCs) ([facility])   . Umbilical hernia   . Wears glasses         Past Surgical History:  Procedure Laterality Date  . CHOLECYSTECTOMY    . CYSTOSCOPY W/ URETERAL STENT PLACEMENT Bilateral 05/24/2020   Procedure: CYSTOSCOPY WITH RETROGRADE PYELOGRAM/URETERAL STENT PLACEMENT;  Surgeon: Robley Fries, MD;  Location: [facility];  Service: Urology;  Laterality: Bilateral;  . CYSTOSCOPY/RETROGRADE/URETEROSCOPY  Bilateral 04/03/2020   Procedure: CYSTOSCOPY/RETROGRADE/URETEROSCOPY;  Surgeon: Ardis Hughs, MD;  Location: [facility];  Service: Urology;  Laterality: Bilateral;  . HYSTERECTOMY ABDOMINAL WITH SALPINGO-OOPHORECTOMY  07/21/2020   Procedure: HYSTERECTOMY ABDOMINAL WITH BILATERAL SALPINGO-OOPHORECTOMY;  Surgeon:  Sanjuana Kava, MD;  Location: [facility];  Service: Gynecology;;  . IR FLUORO GUIDE CV LINE RIGHT  02/21/2020  . IR US GUIDE VASC ACCESS RIGHT  02/21/2020  . NEPHROLITHOTOMY Left 02/14/2020   Procedure: NEPHROLITHOTOMY PERCUTANEOUS/ SURGEON ACCESS/ LEFT PERCUTANEOUS NEPHROSTOMY TUBE PLACEMENT;  Surgeon: Ardis Hughs, MD;  Location: [facility];  Service: Urology;  Laterality: Left;  . NEPHROLITHOTOMY Left 02/21/2020   Procedure: NEPHROLITHOTOMY PERCUTANEOUS SECOND LOOK;  Surgeon: Ardis Hughs, MD;  Location: [facility];  Service: Urology;  Laterality: Left;  . NEPHROLITHOTOMY Left 02/26/2020   Procedure: NEPHROLITHOTOMY PERCUTANEOUS;  Surgeon: Ceasar Mons, MD;  Location: [facility];  Service: Urology;  Laterality: Left;  NEED 150 MIN  . NEPHROLITHOTOMY Right 03/24/2020   Procedure: NEPHROLITHOTOMY PERCUTANEOUS WITH ACCESS LEFT STENT REMOVAL;  Surgeon: Ardis Hughs, MD;  Location: [facility];  Service: Urology;  Laterality: Right;  . RIGHT HEART CATH N/A 11/09/2019   Procedure: RIGHT HEART CATH;  Surgeon: Jolaine Artist, MD;  Location: [facility];  Service: Cardiovascular;  Laterality: N/A;  . WISDOM TOOTH EXTRACTION          Family History  Problem Relation Age of Onset  . Atrial fibrillation Mother   . Hypertension Mother   . Stroke Mother 33  . Diabetes Mother   . Diabetes Father   . Hypertension Father   . Diabetes Sister   . Hypertension Sister   . Diabetes Brother   . Hypertension Brother   . Diabetes Brother   . Heart attack Brother   . Stroke Maternal Grandmother 22   Social History:  reports that she has never smoked. She has never used smokeless tobacco. She reports that she does not drink alcohol and does not use drugs. Allergies:       Allergies  Allergen Reactions  . Adhesive [Tape]     Tears skin, can tolerate paper tape   Medications Prior to Admission  Medication Sig Dispense Refill  . amLODipine (NORVASC) 5 MG tablet  Take 1 tablet (5 mg total) by mouth daily. 30 tablet 6  . carvedilol (COREG) 25 MG tablet Take 1 tablet (25 mg total) by mouth 2 (two) times daily with a meal. 180 tablet 3  . dapagliflozin propanediol (FARXIGA) 10 MG TABS tablet Take 1 tablet (10 mg total) by mouth daily before breakfast. 30 tablet 6  . ELIQUIS 5 MG TABS tablet TAKE 1 TABLET (5 MG TOTAL) BY MOUTH 2 (TWO) TIMES DAILY. 60 tablet 6  . hydrALAZINE (APRESOLINE) 100 MG tablet TAKE 1 TABLET (100 MG TOTAL) BY MOUTH EVERY 8 (EIGHT) HOURS. 90 tablet 6  . insulin glargine (LANTUS) 100 UNIT/ML injection Inject 0.2 mLs (20 Units total) into the skin every morning. 10 mL 2  . isosorbide mononitrate (IMDUR) 60 MG 24 hr tablet TAKE 1 TABLET (60 MG TOTAL) BY MOUTH DAILY. 30 tablet 6  . mexiletine (MEXITIL) 150 MG capsule TAKE 2 CAPSULES (300 MG TOTAL) BY MOUTH EVERY 12 (TWELVE) HOURS. 120 capsule 6  . Multiple Vitamins-Minerals (WOMENS MULTI PO) Take 1 tablet by mouth daily.    . potassium chloride (KLOR-CON) 10 MEQ tablet Take 4 tablets (40 mEq total) by mouth daily. (Patient taking differently: Take 20 mEq by mouth 2 (two)  times daily.) 120 tablet 6  . torsemide (DEMADEX) 20 MG tablet TAKE 2 TABLETS (40 MG TOTAL) BY MOUTH 2 (TWO) TIMES DAILY. 120 tablet 6  . glucose blood test strip Use as instructed 100 each 12  . Lancets (ONETOUCH ULTRASOFT) lancets Check CBG twice a day 60 each 5  . TRUEPLUS INSULIN SYRINGE 30G X 5/16" 0.5 ML MISC USE TO INJECT 12 UNITS AT BEDTIME. 100 each 6    Home: Home Living Family/patient expects to be discharged to:: Private residence Living Arrangements: Spouse/significant other,Other (Comment) (also mother in law, and son) Available Help at Discharge: Family,Available 24 hours/day Type of Home: Mobile home Home Access: Stairs to enter Entrance Stairs-Number of Steps: 5 Entrance Stairs-Rails: Can reach both Home Layout: One level Bathroom Shower/Tub: Walk-in shower (two steps up to get into  shower) Biochemist, clinical: Standard Home Equipment: None Additional Comments: works as a Engineer, manufacturing systems History: Prior Function Level of Independence: Independent Functional Status:  Mobility: Bed Mobility Overal bed mobility: Needs Assistance Bed Mobility: Supine to Sit Supine to sit: HOB elevated,Modified independent (Device/Increase time) General bed mobility comments: technically supervision for safety, but able to get to EOB with Mod(I) with HOB maximally elevated and use of bed rails Transfers Overall transfer level: Needs assistance Equipment used: Ambulation equipment used Transfer via Lift Equipment: Stedy Transfers: Sit to/from [employer] to Stand: Min assist Stand pivot transfers: Total assist General transfer comment: made multiple attempts to stand with RW with MaxAx1 and increasingly elevated surface, unable to clear hips from bed so switched to stedy- able to perform sit to stand transfers with MinA for balance and steadying in this device Ambulation/Gait General Gait Details: unable  ADL:  Cognition: Cognition Overall Cognitive Status: Within Functional Limits for tasks assessed Orientation Level: Oriented X4 Cognition Arousal/Alertness: Awake/alert Behavior During Therapy: WFL for tasks assessed/performed Overall Cognitive Status: Within Functional Limits for tasks assessed  Blood pressure 137/71, pulse 65, temperature 98.4 F (36.9 C), temperature source Oral, resp. rate 18, SpO2 100 %. Physical Exam Vitals and nursing note reviewed. Exam conducted with a chaperone present.  Constitutional:      Comments: Sitting up in bed; appears younger than stated age, daughter in law at bedside, and RN in and out, appropriate, NAD  HENT:     Head: Normocephalic and atraumatic.     Comments: Trace L facial droop- does better/equalizes with smile; tongue midline- does has slight lisp with some words    Right Ear: External ear  normal.     Left Ear: External ear normal.     Nose: Nose normal. No congestion.     Mouth/Throat:     Mouth: Mucous membranes are moist.     Pharynx: Oropharynx is clear. No oropharyngeal exudate.  Eyes:     General:        Right eye: No discharge.        Left eye: No discharge.     Extraocular Movements: Extraocular movements intact.     Conjunctiva/sclera: Conjunctivae normal.     Comments: No nystagmus- EOMI B/L  Cardiovascular:     Comments: Although has Afib, sounds for 30_ seconds to be in Regular rate and rhythm, not irregular right now Pulmonary:     Comments: CTA B/L- no W/R/R- good air movement Abdominal:     Comments: Soft, NT, ND, (+)BS - hypoactive- just finished lunch- didn't eat bread on tray  Musculoskeletal:     Cervical back: Normal range of motion.  No rigidity.     Comments: RUE 5/5 in biceps, triceps, WE grip and finger abd LUE- biceps 4-/5, triceps 4-/5, WE 3-/5, grip 2+/5, and finger abd 1/5 RLE_ 5/5 in HF, KE, KF DF and PF LLE- HF and KE 4-/5, KF 4-/5, DF and PF 4/5   Skin:    General: Skin is warm and dry.     Comments: No skin breakdown on heels or extremities B/L  Neurological:     Mental Status: She is oriented to person, place, and time.     Comments: Patient is alert no acute distress.  Speech is a bit dysarthric but intelligible.  Oriented x3 and follows commands.  Ox3- sitting up in bed- slight lisp occ; but not all the time- facial sensation and all 4 extremities intact to light touch Not able to test- Rapid alternating movements, or finger to nose on left due to weakness  Psychiatric:        Mood and Affect: Mood normal.        Behavior: Behavior normal.     Lab Results Last 24 Hours       Results for orders placed or performed during the hospital encounter of 12/24/20 (from the past 24 hour(s))  Resp Panel by RT-PCR (Flu A&B, Covid) Nasopharyngeal Swab     Status: None   Collection Time: 12/24/20  4:18 PM   Specimen: Nasopharyngeal  Swab; Nasopharyngeal(NP) swabs in vial transport medium  Result Value Ref Range   SARS Coronavirus 2 by RT PCR NEGATIVE NEGATIVE   Influenza A by PCR NEGATIVE NEGATIVE   Influenza B by PCR NEGATIVE NEGATIVE  Urine rapid drug screen (hosp performed)not at [facility]     Status: None   Collection Time: 12/24/20  6:59 PM  Result Value Ref Range   Opiates NONE DETECTED NONE DETECTED   Cocaine NONE DETECTED NONE DETECTED   Benzodiazepines NONE DETECTED NONE DETECTED   Amphetamines NONE DETECTED NONE DETECTED   Tetrahydrocannabinol NONE DETECTED NONE DETECTED   Barbiturates NONE DETECTED NONE DETECTED  Glucose, capillary     Status: Abnormal   Collection Time: 12/24/20  9:26 PM  Result Value Ref Range   Glucose-Capillary 240 (H) 70 - 99 mg/dL  Hemoglobin A1c     Status: Abnormal   Collection Time: 12/25/20  2:02 AM  Result Value Ref Range   Hgb A1c MFr Bld 8.0 (H) 4.8 - 5.6 %   Mean Plasma Glucose 182.9 mg/dL  Lipid panel     Status: Abnormal   Collection Time: 12/25/20  2:02 AM  Result Value Ref Range   Cholesterol 260 (H) 0 - 200 mg/dL   Triglycerides 141 <150 mg/dL   HDL 59 >40 mg/dL   Total CHOL/HDL Ratio 4.4 RATIO   VLDL 28 0 - 40 mg/dL   LDL Cholesterol 173 (H) 0 - 99 mg/dL  TSH     Status: None   Collection Time: 12/25/20  2:02 AM  Result Value Ref Range   TSH 1.712 0.350 - 4.500 uIU/mL  Glucose, capillary     Status: Abnormal   Collection Time: 12/25/20  6:17 AM  Result Value Ref Range   Glucose-Capillary 148 (H) 70 - 99 mg/dL      Imaging Results (Last 48 hours)  CT HEAD WO CONTRAST  Result Date: 12/24/2020 CLINICAL DATA:  Neuro deficit, acute stroke suspected. Abnormal gait and aphasia stray. Today's having left-sided weakness. EXAM: CT HEAD WITHOUT CONTRAST TECHNIQUE: Contiguous axial images were obtained from the base of  the skull through the vertex without intravenous contrast. COMPARISON:  None. FINDINGS: Brain: Small area of hypoattenuation  in the left periatrial white matter (see series 5, image 44 and series 3, image 15). Additional small area of hypoattenuation in the right corona radiata. No acute hemorrhage. No hydrocephalus. No mass lesion. No abnormal mass effect. No midline shift. No extra-axial fluid collection. Vascular: No hyperdense vessel identified. Calcific atherosclerosis. Skull: No acute fracture. Punctate foreign body in the subcutaneous high posterior left scalp without soft tissue swelling. Sinuses/Orbits: No acute finding. Other: No mastoid effusions. IMPRESSION: 1. Small area of hypoattenuation in the left periatrial white matter could represent chronic microvascular ischemic disease or age indeterminate lacunar infarct. Additional age indeterminate lacunar infarct within the right corona radiata, favored remote. If there is concern for acute infarct, MRI could further evaluate. 2. No acute hemorrhage. 3. Punctate foreign body in the subcutaneous high posterior left scalp, likely remote given no scalp swelling. Electronically Signed   By: Margaretha Sheffield MD   On: 12/24/2020 12:30   MR ANGIO HEAD WO CONTRAST  Result Date: 12/24/2020 CLINICAL DATA:  Follow-up examination for acute stroke. EXAM: MRA HEAD WITHOUT CONTRAST TECHNIQUE: Angiographic images of the Circle of Willis were obtained using MRA technique without intravenous contrast. COMPARISON:  Prior MRI from earlier the same day. FINDINGS: ANTERIOR CIRCULATION: Visualized distal cervical segments of the internal carotid arteries are widely patent with symmetric antegrade flow. Petrous, cavernous, and supraclinoid segments widely patent without stenosis or other abnormality. A1 segments widely patent. Normal anterior communicating artery complex. Anterior cerebral arteries widely patent to their distal aspects. No M1 stenosis or occlusion. Normal MCA bifurcations. Distal MCA branches well perfused and symmetric. POSTERIOR CIRCULATION: Both V4 segments widely patent to  the vertebrobasilar junction without stenosis. Left vertebral artery dominant. Both PICA origins patent and normal. Basilar widely patent to its distal aspect. Superior cerebellar arteries patent bilaterally. Both PCAs supplied via the basilar as well as small bilateral posterior communicating arteries. PCAs well perfused to their distal aspects without stenosis. No intracranial aneurysm or other vascular abnormality. IMPRESSION: Normal intracranial MRA. No large vessel occlusion, hemodynamically significant stenosis, or other acute vascular abnormality. Electronically Signed   By: Jeannine Boga M.D.   On: 12/24/2020 22:40   MR BRAIN WO CONTRAST  Result Date: 12/24/2020 CLINICAL DATA:  Neuro deficit, acute stroke suspected. EXAM: MRI HEAD WITHOUT CONTRAST TECHNIQUE: Multiplanar, multiecho pulse sequences of the brain and surrounding structures were obtained without intravenous contrast. COMPARISON:  Same day head CT. FINDINGS: Brain: Acute infarct within the right hemi pons. Mild associated edema without substantial mass effect. Small acute infarct in the left frontal subcortical white matter (see series 2 and 250, image 29) without associated edema. Small remote lacunar infarcts in the right corona radiata and left periatrial white matter. No hydrocephalus. No acute hemorrhage. No mass lesion. No extra-axial fluid collection. Vascular: Major arterial flow voids are maintained at the skull base. Skull and upper cervical spine: Normal marrow signal. Sinuses/Orbits: Sinuses are clear.  Unremarkable orbits. IMPRESSION: 1. Acute infarct in the right hemipons. Additional small acute infarct in the left frontal subcortical white matter. 2. Mild associated edema in the right pons without substantial mass effect. 3. Small remote lacunar infarcts in the right corona radiata and left periatrial white matter. Findings discussed with Dr. Shelly Coss via telephone at 2:02 PM. Electronically Signed   By: Margaretha Sheffield  MD   On: 12/24/2020 14:05   VAS US CAROTID  Result Date: 12/25/2020 Carotid  Arterial Duplex Study Indications:   CVA and Left side weakness, left facial drrop, speech                disturbance. Risk Factors:  Hypertension, Diabetes, no history of smoking. Other Factors: CKD4, CHF, AFIB, Obesity. Performing Technologist: Rogelia Rohrer  Examination Guidelines: A complete evaluation includes B-mode imaging, spectral Doppler, color Doppler, and power Doppler as needed of all accessible portions of each vessel. Bilateral testing is considered an integral part of a complete examination. Limited examinations for reoccurring indications may be performed as noted.  Right Carotid Findings: +----------+--------+--------+--------+------------------+--------+           PSV cm/sEDV cm/sStenosisPlaque DescriptionComments +----------+--------+--------+--------+------------------+--------+ CCA Prox  97      11                                         +----------+--------+--------+--------+------------------+--------+ CCA Distal78      28                                         +----------+--------+--------+--------+------------------+--------+ ICA Prox  65      16      1-39%                              +----------+--------+--------+--------+------------------+--------+ ICA Distal109     25                                         +----------+--------+--------+--------+------------------+--------+ ECA       89      0                                          +----------+--------+--------+--------+------------------+--------+ +----------+--------+-------+----------------+-------------------+           PSV cm/sEDV cmsDescribe        Arm Pressure (mmHG) +----------+--------+-------+----------------+-------------------+ [id number]     0      Multiphasic, WNL                    +----------+--------+-------+----------------+-------------------+  +---------+--------+--+--------+--+---------+ VertebralPSV cm/s61EDV cm/s13Antegrade +---------+--------+--+--------+--+---------+  Left Carotid Findings: +----------+--------+--------+--------+------------------+--------+           PSV cm/sEDV cm/sStenosisPlaque DescriptionComments +----------+--------+--------+--------+------------------+--------+ CCA Prox  107     15                                         +----------+--------+--------+--------+------------------+--------+ CCA Distal84      18                                         +----------+--------+--------+--------+------------------+--------+ ICA Prox  89      25      1-39%                              +----------+--------+--------+--------+------------------+--------+ ICA Distal93  28                                         +----------+--------+--------+--------+------------------+--------+ ECA       64      0                                          +----------+--------+--------+--------+------------------+--------+ +----------+--------+--------+----------------+-------------------+           PSV cm/sEDV cm/sDescribe        Arm Pressure (mmHG) +----------+--------+--------+----------------+-------------------+ [id number]      11      Multiphasic, WNL                    +----------+--------+--------+----------------+-------------------+ +---------+--------+--+--------+--+---------+ VertebralPSV cm/s63EDV cm/s19Antegrade +---------+--------+--+--------+--+---------+   Summary: Right Carotid: Velocities in the right ICA are consistent with a 1-39% stenosis.                The extracranial vessels were near-normal with only minimal wall                thickening or plaque. Left Carotid: Velocities in the left ICA are consistent with a 1-39% stenosis.               The extracranial vessels were near-normal with only minimal wall               thickening or plaque. Vertebrals:  Bilateral  vertebral arteries demonstrate antegrade flow. Subclavians: Normal flow hemodynamics were seen in bilateral subclavian              arteries. *See table(s) above for measurements and observations.     Preliminary       Assessment/Plan: Diagnosis: R pontine stroke with L hemiparesis and L facial droop/on regular DM diet 1. Does the need for close, 24 hr/day medical supervision in concert with the patient's rehab needs make it unreasonable for this patient to be served in a less intensive setting? Yes 2. Co-Morbidities requiring supervision/potential complications: sCHF, DM with A1c of 8.0, CKD stage IV, Afib with hx of RVR, HTN, and HLD.  3. Due to bladder management, safety, skin/wound care, disease management, medication administration, pain management and patient education, does the patient require 24 hr/day rehab nursing? Yes 4. Does the patient require coordinated care of a physician, rehab nurse, therapy disciplines of PT and OT to address physical and functional deficits in the context of the above medical diagnosis(es)? Yes Addressing deficits in the following areas: balance, endurance, locomotion, strength, transferring, bathing, dressing, feeding, grooming and toileting 5. Can the patient actively participate in an intensive therapy program of at least 3 hrs of therapy per day at least 5 days per week? Yes 6. The potential for patient to make measurable gains while on inpatient rehab is excellent 7. Anticipated functional outcomes upon discharge from inpatient rehab are modified independent and supervision  with PT, modified independent, supervision and min assist with OT, n/a with SLP. 8. Estimated rehab length of stay to reach the above functional goals is: 14-18 days 9. Anticipated discharge destination: Home 10. Overall Rehab/Functional Prognosis: excellent  RECOMMENDATIONS: This patient's condition is appropriate for continued rehabilitative care in the following setting:  CIR Patient has agreed to participate in recommended program. Yes Note that insurance prior authorization may be required for reimbursement  for recommended care.  Comment:  1. Went over issues that can affect stroke pt's including 40% of stroke patients at risk for developing spasticity and how to do contraint induced therapy.  2. Explained spasticity- 40% of stroke pt's- muscle spasms and tightness 6-12 weeks after stroke usually- always by 6 months if will develop it- explained need to see PM&R doctors to treat here in [place], and to not "let it go"- because can continue to get worse over time.  3. Went over contraint induced therapy- usually wait until out of CIR- but then if muscles are at least 2/5 in LUE, can cover RUE with oven mitt, etc, and keep using LUE for 6+ hours/day to improve strength/fine motor dexterity and ability to use LUE- can do for up to 5-10 years down the road.  4. Will submit for CIR admission- explained will likely need to reapply for medicaid, and discussed how CIR/acute hospital SW could help.  5. Thank you for this consult  I spent a total of 80 minutes doing consult-  >50% on coordination/care. 20 minutes reviewing chart- 40 minutes educating and seeing pt/daughter in law- and 20 minutes typing up consult.       Lavon Paganini Angiulli, PA-C 12/25/2020    I have personally performed a face to face diagnostic evaluation of this patient and formulated the key components of the plan. Additionally, I have personally reviewed laboratory data, imaging studies, as well as relevant notes and concur with the physician assistant's documentation above.            Revision History                        Routing History              Note Details  Jan Fireman, MD File Time 12/25/2020 4:45 PM  Author Type Physician Status Signed  Last Editor Courtney Heys, MD Service Physical Medicine and [place] # [id number]  [place] Date 12/27/2020

## 2020-12-29 NOTE — Progress Notes (Signed)
[facility] Individual Statement of Services  Patient Name:  Adrienne Lane  Date:  12/29/2020  Welcome to the [facility].  Our goal is to provide you with an individualized program based on your diagnosis and situation, designed to meet your specific needs.  With this comprehensive rehabilitation program, you will be expected to participate in at least 3 hours of rehabilitation therapies Monday-Friday, with modified therapy programming on the weekends.  Your rehabilitation program will include the following services:  Physical Therapy (PT), Occupational Therapy (OT), Speech Therapy (ST), 24 hour per day rehabilitation nursing, Neuropsychology, Care Coordinator, Rehabilitation Medicine, Nutrition Services and Pharmacy Services  Weekly team conferences will be held on Wednesday to discuss your progress.  Your Inpatient Rehabilitation Care Coordinator will talk with you frequently to get your input and to update you on team discussions.  Team conferences with you and your family in attendance may also be held.  Expected length of stay: 9-12 days  Overall anticipated outcome: independent-supervision level  Depending on your progress and recovery, your program may change. Your Inpatient Rehabilitation Care Coordinator will coordinate services and will keep you informed of any changes. Your Inpatient Rehabilitation Care Coordinator's name and contact numbers are listed  below.  The following services may also be recommended but are not provided by the [facility] will be made to provide these services after discharge if needed.  Arrangements include referral to agencies that provide these services.  Your insurance has been verified to be:  Self Pay-Medicaid pending Your primary doctor is:  [facility]  Pertinent  information will be shared with your doctor and your insurance company.  Inpatient Rehabilitation Care Coordinator:  Ovidio Kin, [place] or Emilia Beck  Information discussed with and copy given to patient by: Elease Hashimoto, 12/29/2020, 9:31 AM

## 2020-12-29 NOTE — Progress Notes (Signed)
Occupational Therapy Session Note  Patient Details  Name: Adrienne Lane MRN: [id number] Date of Birth: [birth year]  Today's Date: 12/29/2020 OT Individual Time: 1300-1400 OT Individual Time Calculation (min): 60 min    Short Term Goals: Week 1:  OT Short Term Goal 1 (Week 1): patient iwll complete bathing and dressing with min A OT Short Term Goal 2 (Week 1): patient will complete toileting with min a OT Short Term Goal 3 (Week 1): patient will complete functional transfers with CGA OT Short Term Goal 4 (Week 1): patient will increase ability to hold and manipulate items with left hand 25% OT Short Term Goal 5 (Week 1): patient will complete basic HM with RW CGA  Skilled Therapeutic Interventions/Progress Updates:    Treatment session with focus on functional transfers and LUE NMR.  Pt received upright in recliner agreeable to therapy session.  Engaged in [place] in sitting position with focus on shoulder, elbow, and hand function incorporating reaching, grasping, and pinch.  Utilized large foam Lego blocks initially having pt pick up and stack blocks while incorporating functional reaching.  Progressed to increased reach to knock over blocks while increasing further reach.  Pt able to complete 2 patterns with blocks requiring min cues to utilize LUE as pt with tendency to use dominant RUE.  Utilized resistive clothespins with focus on increased vertical reach and grasp.  Pt able to grasp yellow clothespins but unable to pinch enough to open red to remove or place on horizontal or vertical dowel.  Therapist challenged pt to attempt 3 jaw chuck, as pt demonstrating improved pinch grasp but minimal use of 3rd digit.  Therapist encouraged pt to complete finger abduction/adduction, isolated finger extension, and wrist flexion/extension exercises in room during downtime.  Pt returned to room and completed stand pivot transfer CGA back to recliner without AD.  Pt remained upright in reclined with all  needs in reach and chair alarm on.  Therapy Documentation Precautions:  Precautions Precautions: Fall Precaution Comments: L sided weakness Restrictions Weight Bearing Restrictions: No General:   Vital Signs: Therapy Vitals Temp: (!) 97.5 F (36.4 C) Pulse Rate: 69 Resp: 18 BP: (!) 179/73 Patient Position (if appropriate): Sitting Oxygen Therapy SpO2: 96 % O2 Device: Room Air Pain: Pain Assessment Pain Scale: 0-10 Pain Score: 0-No pain   Therapy/Group: Individual Therapy  Simonne Come 12/29/2020, 3:01 PM

## 2020-12-30 ENCOUNTER — Inpatient Hospital Stay (HOSPITAL_COMMUNITY): Payer: Self-pay | Admitting: Occupational Therapy

## 2020-12-30 ENCOUNTER — Inpatient Hospital Stay (HOSPITAL_COMMUNITY): Payer: Self-pay

## 2020-12-30 DIAGNOSIS — R0989 Other specified symptoms and signs involving the circulatory and respiratory systems: Secondary | ICD-10-CM

## 2020-12-30 LAB — GLUCOSE, CAPILLARY
Glucose-Capillary: 114 mg/dL — ABNORMAL HIGH (ref 70–99)
Glucose-Capillary: 124 mg/dL — ABNORMAL HIGH (ref 70–99)
Glucose-Capillary: 99 mg/dL (ref 70–99)
Glucose-Capillary: 128 mg/dL — ABNORMAL HIGH (ref 70–99)

## 2020-12-30 NOTE — Progress Notes (Signed)
Occupational Therapy Session Note  Patient Details  Name: Adrienne Lane MRN: [id number] Date of Birth: [birth year]  Today's Date: 12/30/2020 OT Individual Time: 1004-1100 OT Individual Time Calculation (min): 56 min    Short Term Goals: Week 1:  OT Short Term Goal 1 (Week 1): patient iwll complete bathing and dressing with min A OT Short Term Goal 2 (Week 1): patient will complete toileting with min a OT Short Term Goal 3 (Week 1): patient will complete functional transfers with CGA OT Short Term Goal 4 (Week 1): patient will increase ability to hold and manipulate items with left hand 25% OT Short Term Goal 5 (Week 1): patient will complete basic HM with RW CGA  Skilled Therapeutic Interventions/Progress Updates:    Treatment session with focus on LUE NMR, dynamic standing balance, and functional use of LUE during self-care tasks.  Pt received upright in recliner reporting already changed clothes this AM, therefore declined bathing/dressing this session.  Pt ambulated 150' with RW with CGA to close supervision to therapy gym.  Engaged in [place] in supine with focus on improved AROM and AAROM with use of RUE to facilitate increased range.  Engaged in "snow angel" glides in supine and utilized dowel in supine with focus on symmetrical movements.  Utilized large therapy ball in sitting to facilitate increased shoulder flexion and elbow extension with anterior/posterior and clockwise/counterclockwise.  Therapist encouraged use of RUE over LUE to facilitate increased ROM.  Engaged in gross and fine motor control with foam lego blocks with grasp and manipulation with stacking and standing up on edge with focus on increased use of middle finger and gross grasp.  Ambulated back to room with RW with supervision.  Pt ambulated to toilet without AD with min assist.  Pt able to doff pants, after therapist assisted with unfastening jeans and able to pull pants over hips with increased time.  Therapist assisted  with fastening pants.  Pt ambulated back to recliner without AD with min assist.  Pt remained upright in recliner with chair alarm on and all needs in reach.  Therapy Documentation Precautions:  Precautions Precautions: Fall Precaution Comments: L sided weakness Restrictions Weight Bearing Restrictions: No Pain:  Pt with no c/o pain   Therapy/Group: Individual Therapy  Simonne Come 12/30/2020, 12:17 PM

## 2020-12-30 NOTE — Progress Notes (Signed)
[facility] PHYSICAL MEDICINE & REHABILITATION PROGRESS NOTE   Subjective/Complaints: Patient seen laying in bed this morning.  She states she slept well overnight.  She denies complaints.  ROS: Denies CP, SOB, N/V/D  Objective:   No results found. Recent Labs    12/29/20 0603  WBC 4.6  HGB 12.0  HCT 36.4  PLT 175   Recent Labs    12/29/20 0603  NA 139  K 3.4*  CL 106  CO2 24  GLUCOSE 125*  BUN 31*  CREATININE 1.77*  CALCIUM 9.3    Intake/Output Summary (Last 24 hours) at 12/30/2020 1027 Last data filed at 12/29/2020 2106 Gross per 24 hour  Intake 640 ml  Output -  Net 640 ml        Physical Exam: Vital Signs Blood pressure (!) 147/71, pulse (!) 58, temperature 99.8 F (37.7 C), temperature source Oral, resp. rate 16, height 5\' 8"  (1.727 m), weight 103.5 kg, SpO2 100 %. Constitutional: No distress . Vital signs reviewed. HENT: Normocephalic.  Atraumatic. Eyes: EOMI. No discharge. Cardiovascular: No JVD.  RRR. Respiratory: Normal effort.  No stridor.  Bilateral clear to auscultation. GI: Non-distended.  BS +. Skin: Warm and dry.  Intact. Psych: Normal mood.  Normal behavior. Musc: No edema in extremities.  No tenderness in extremities. Neuro: Alert Dysarthria, unchanged Motor: RUE/RLE: 5/5 proximal distal LUE: Shoulder abduction 3/5, distally 4-4+/5 with apraxia, unchanged LLE: 4+/5 proximal distal   Assessment/Plan: 1. Functional deficits which require 3+ hours per day of interdisciplinary therapy in a comprehensive inpatient rehab setting.  Physiatrist is providing close team supervision and 24 hour management of active medical problems listed below.  Physiatrist and rehab team continue to assess barriers to discharge/monitor patient progress toward functional and medical goals  Care Tool:  Bathing    Body parts bathed by patient: Right arm,Left arm,Chest,Abdomen,Front perineal area,Buttocks,Right upper leg,Left upper leg,Face   Body parts  bathed by helper: Chest,Abdomen,Buttocks,Right lower leg,Left lower leg,Right arm     Bathing assist Assist Level: Moderate Assistance - Patient 50 - 74%     Upper Body Dressing/Undressing Upper body dressing   What is the patient wearing?: Bra,Pull over shirt    Upper body assist Assist Level: Moderate Assistance - Patient 50 - 74%    Lower Body Dressing/Undressing Lower body dressing      What is the patient wearing?: Underwear/pull up,Pants     Lower body assist Assist for lower body dressing: Moderate Assistance - Patient 50 - 74%     Toileting Toileting    Toileting assist Assist for toileting: Minimal Assistance - Patient > 75%     Transfers Chair/bed transfer  Transfers assist     Chair/bed transfer assist level: Minimal Assistance - Patient > 75%     Locomotion Ambulation   Ambulation assist      Assist level: Minimal Assistance - Patient > 75% Assistive device: Walker-rolling Max distance: 160'   Walk 10 feet activity   Assist     Assist level: Contact Guard/Touching assist Assistive device: Walker-rolling   Walk 50 feet activity   Assist    Assist level: Minimal Assistance - Patient > 75% Assistive device: Walker-rolling    Walk 150 feet activity   Assist    Assist level: Minimal Assistance - Patient > 75% Assistive device: Walker-rolling    Walk 10 feet on uneven surface  activity   Assist Walk 10 feet on uneven surfaces activity did not occur: Safety/medical concerns  Wheelchair     Assist Will patient use wheelchair at discharge?: No             Wheelchair 50 feet with 2 turns activity    Assist            Wheelchair 150 feet activity     Assist          Medical Problem List and Plan: 1.Left side weakness with facial droop/slurred speechsecondary to right pontine lacunar infarction  Continue CIR  2.  Antithrombotics: -DVT/anticoagulation:Pradaxa -antiplatelet therapy: Aspirin 81 mg daily 3. Pain Management:Tylenol as needed 4. Mood:Provide emotional support -antipsychotic agents: N/A 5. Neuropsych: This patientiscapable of making decisions on herown behalf. 6. Skin/Wound Care:Routine skin checks 7. Fluids/Electrolytes/Nutrition:Routine in and outs. 8. Atrial fibrillation. Cardiac rate controlled and regular. Continue Pradaxa as well as Mexitil 300 mg every 12 hours 9. Hypertension.    -Coreg 3.125 mg twice daily.    Hydralazine increased to 50 3 times daily on 1/10 (100 3 times daily PTA)  Labile, but?  Improving on 1/11 10. Diastolic congestive heart failure. Monitor for any signs of fluid overload Filed Weights   12/27/20 1853 12/29/20 0557 12/30/20 0553  Weight: 104.5 kg 103.6 kg 103.5 kg   Stable on 1/11 11. CKD stage III. Baseline creatinine 1.55-2.38.   -on demadex 40mg  bid at home  Creatinine 1.77 on 1/10  Continue to monitor 12. Diabetes mellitus with peripheral neuropathy. Hemoglobin A1c 8.0.   Lantus insulin 20 units daily.   Farxiga 10mg  daily at home, resumed on 1/9  Slightly labile, but?  Improving on 1/11  Monitor with increased mobility 13. Hyperlipidemia. Lipitor 14. Obesity. BMI 34.06. Dietary follow-up 15.  Hypokalemia  Potassium 3.4 on 1/10  Supplemented x1 day  Continue to monitor  LOS: 3 days A FACE TO FACE EVALUATION WAS PERFORMED  B[place]ton Mckesson Lorie Phenix 12/30/2020, 8:23 AM

## 2020-12-30 NOTE — Progress Notes (Signed)
Physical Therapy Session Note  Patient Details  Name: Adrienne Lane MRN: [id number] Date of Birth: 11/02/1966  Today's Date: 12/30/2020 PT Individual Time: 4098-1191 PT Individual Time Calculation (min): 72 min   Short Term Goals: Week 1:  PT Short Term Goal 1 (Week 1): Pt will complete least restrictive transfer with CGA consistently PT Short Term Goal 2 (Week 1): Pt will initiate stair training PT Short Term Goal 3 (Week 1): Pt will ambulate x 150 ft with LRAD and CGA consistently  Skilled Therapeutic Interventions/Progress Updates:   Received pt standing in front of commode with NT present, PT took over with care. Pt able to pull pants over hips with CGA but required total A to manage zipper and button on jeans. Pt agreeable to therapy, and denied any pain during session. Session with emphasis on functional mobility/transfers, generalized strengthening, dynamic standing balance/coordination, gait training, and improved activity tolerance. Pt ambulated 39ft with RW and CGA to sink and stood and washed hands with supervision. Pt sat in [place] and doffed non-skid socks with supervision and donned regular sock and R shoe with supervision but required total A to don L sock and shoe. Pt transported to dayroom in [facility] total A for time management purposes and transferred sit<>stand without AD and CGA. Donned LiteGait harness in standing with max A and pt stepped onto treadmill with min A. Pt worked on Personnel officer for the following time frames: Trial 1: 3 minutes 53 seconds at 0.57mph for 244ft with BUE support. Pt required cues for step through gait pattern and to increase L stride length. Pt required visual cues to widen BOS to have each LE on either side of white line Trial 2: 5 minutes and 44 seconds at 0.6-0.54mph for 338ft with BUE support fading to no UE support. Pt scuffing L foot on treadmill and therapist provided verbal cues to "make L foot silent" for improved foot clearance and pt demonstrated  improved L step length and heel strike.  Trial 3: 30 seconds at 0.45mph lateral side stepping with BUE support. Pt with multiple LOB and requested to stop activity. Pt declined any furter lateral stepping on treadmill, reporting she felt like she didn't have control of her L leg. Pt stepped off treadmill with min A and doffed LiteGait harness in standing with max A. Pt transported back to room in [facility] total A and ambulated 14ft without AD and CGA to recliner. Concluded session with pt sitting in recliner, needs within reach, and chair pad alarm on.   Therapy Documentation Precautions:  Precautions Precautions: Fall Precaution Comments: L sided weakness Restrictions Weight Bearing Restrictions: No  Therapy/Group: Individual Therapy Alfonse Alpers PT, DPT   12/30/2020, 7:23 AM

## 2020-12-30 NOTE — Evaluation (Signed)
Speech Language Pathology Assessment and Plan  Patient Details  Name: Adrienne Lane MRN: [id number] Date of Birth: 1966/06/17  SLP Diagnosis: Dysarthria  Rehab Potential: Good ELOS: 9-12 days (much shorter for ST)    Today's Date: 12/30/2020 SLP Individual Time: 0802-0900 SLP Individual Time Calculation (min): 58 min   Hospital Problem: Principal Problem:   Right pontine cerebrovascular accident [facility]) Active Problems:   Hypokalemia   Labile blood glucose   Poorly controlled type 2 diabetes mellitus with peripheral neuropathy ([place])   Stage 3b chronic kidney disease ([place])   Labile blood pressure  Past Medical History:  Past Medical History:  Diagnosis Date  . Atrial fibrillation with Lane ([place])   . [place] 01/2020  . CHF (congestive heart failure) ([place]) 10/20/2019  . CKD (chronic kidney disease), stage IV ([place])   . Diabetes mellitus without complication ([place])   . DOE (dyspnea on exertion)    walking upstairs or up hill resolves in one minute  . Fibroids   . History of kidney stones   . History of recent blood transfusion 02/26/2020  . Hypertension   . Iron deficiency anemia   . Non-ischemic cardiomyopathy (HCC)    tachycardia induced  . Obese   . Peripheral edema   . Premature ventricular contractions (PVCs) ([facility])   . Umbilical hernia   . Wears glasses    Past Surgical History:  Past Surgical History:  Procedure Laterality Date  . CHOLECYSTECTOMY    . CYSTOSCOPY W/ URETERAL STENT PLACEMENT Bilateral 05/24/2020   Procedure: CYSTOSCOPY WITH RETROGRADE PYELOGRAM/URETERAL STENT PLACEMENT;  Surgeon: Robley Fries, MD;  Location: [facility];  Service: Urology;  Laterality: Bilateral;  . CYSTOSCOPY/RETROGRADE/URETEROSCOPY Bilateral 04/03/2020   Procedure: CYSTOSCOPY/RETROGRADE/URETEROSCOPY;  Surgeon: Ardis Hughs, MD;  Location: [facility];  Service: Urology;  Laterality: Bilateral;  . HYSTERECTOMY ABDOMINAL WITH SALPINGO-OOPHORECTOMY  07/21/2020   Procedure:  HYSTERECTOMY ABDOMINAL WITH BILATERAL SALPINGO-OOPHORECTOMY;  Surgeon: Sanjuana Kava, MD;  Location: [place];  Service: Gynecology;;  . IR FLUORO GUIDE CV LINE RIGHT  02/21/2020  . IR US GUIDE VASC ACCESS RIGHT  02/21/2020  . NEPHROLITHOTOMY Left 02/14/2020   Procedure: NEPHROLITHOTOMY PERCUTANEOUS/ SURGEON ACCESS/ LEFT PERCUTANEOUS NEPHROSTOMY TUBE PLACEMENT;  Surgeon: Ardis Hughs, MD;  [facility];  Service: Urology;  Laterality: Left;  . NEPHROLITHOTOMY Left 02/21/2020   Procedure: NEPHROLITHOTOMY PERCUTANEOUS SECOND LOOK;  Surgeon: Ardis Hughs, MD;  [facility];  Service: Urology;  Laterality: Left;  . NEPHROLITHOTOMY Left 02/26/2020   Procedure: NEPHROLITHOTOMY PERCUTANEOUS;  Surgeon: Ceasar Mons, MD;  Location[facility];  Service: Urology;  Laterality: Left;  NEED 150 MIN  . NEPHROLITHOTOMY Right 03/24/2020   Procedure: NEPHROLITHOTOMY PERCUTANEOUS WITH ACCESS LEFT STENT REMOVAL;  Surgeon: Ardis Hughs, MD;  [facility];  Service: Urology;  Laterality: Right;  . RIGHT HEART CATH N/A 11/09/2019   Procedure: RIGHT HEART CATH;  Surgeon: Jolaine Artist, MD;  Location: [place] [facility];  Service: Cardiovascular;  Laterality: N/A;  . WISDOM TOOTH EXTRACTION      Assessment / Plan / Recommendation Clinical Impression Adrienne Lane a 55 y.o.right-handed femalewith history of A. fib with Lane maintained on Eliquis, diastolic congestive heart failure, CKD stage III, diabetes mellitus, obesity, hypertension and cardiomyopathy. Per chart review lives with spouse also mother-in-law and son. Mobile home 5 steps to entry. Reportedly independent prior to admission. Presented 12/25/2019 with left-sided weakness facial droop and slurred speech. CT/MRI showed acute infarct right hemipons as well as additional small acute infarct  left frontal subcortical white matter. Small remote lacunar infarct in the right corona radiata and left periatrial white matter.  Patient did not receive TPA. MRA of the head negative. Echocardiogram pending. Admission chemistries unremarkable except potassium 3.3 glucose 305 BUN 32 creatinine 2.12, urine drug screen negative. Patient currently remains on Eliquis prior to admission with the addition of low-dose aspirin. Maintain on a regular consistency diet. Therapy evaluations completed with recommendations of physical medicine rehab consult.  Pt presents with very mild dysarthria, creating an occasional lateral lisp in conversation. Pt requested ST services due to mild speech changes. Pt is 100% intelligible, however occasional lateral lisp was noted in conversation on /s/ and cluster sibilants, likely due to left lingual reduced sensation or swelling. Pt stated " I feel like my tongue is to big for my mouth." Pt expressed frustration with slight speech change due to baseline ability and being able to communicate in the community. Pt would benefit from skilled ST services in order to maximize functional independence, likely requiring 1 follow up ST treatment to carryover speech strategies with no further ST services needed.    Skilled Therapeutic Interventions          Skilled ST services focused on speech skills. SLP facilitated administration of informal dysarthria assessments and provided education of results. Pt demonstrated very mild lateral lisp in which pt was able to correct with supervision A verbal cues. All questions answered to satisfaction. Pt was left in room with call bell within reach and bed alarm set. SLP recommends to continue skilled services.   SLP Assessment  Patient will need skilled [place] [facility] Services during CIR admission    Recommendations  Patient destination: Home Follow up Recommendations: None Equipment Recommended: None recommended by SLP    SLP Frequency 3 to 5 out of 7 days   SLP Duration  SLP Intensity  SLP Treatment/Interventions 9-12 days (much shorter for  ST)  Minumum of 1-2 x/day, 30 to 90 minutes  Patient/family education;Speech/Language facilitation    Pain Pain Assessment Pain Score: 0-No pain  Prior Functioning Cognitive/Linguistic Baseline: Within functional limits Type of Home: Mobile home  Lives With: Son;Family;Spouse Available Help at Discharge: Family;Available 24 hours/day Education: some college Vocation: On disability  SLP Evaluation Cognition Overall Cognitive Status: Within Functional Limits for tasks assessed Arousal/Alertness: Awake/alert Attention: Alternating Alternating Attention: Appears intact Memory: Appears intact Safety/Judgment: Appears intact  Comprehension Auditory Comprehension Overall Auditory Comprehension: Appears within functional limits for tasks assessed Expression Expression Primary Mode of Expression: Verbal Verbal Expression Overall Verbal Expression: Appears within functional limits for tasks assessed Oral Motor Oral Motor/Sensory Function Overall Oral Motor/Sensory Function: Mild impairment Facial ROM: Reduced left Facial Symmetry: Abnormal symmetry left Facial Strength: Within Functional Limits Facial Sensation: Reduced left Lingual ROM: Within Functional Limits Lingual Strength: Within Functional Limits Lingual Sensation: Reduced Motor Speech Overall Motor Speech: Appears within functional limits for tasks assessed Respiration: Within functional limits Resonance: Within functional limits Articulation: Impaired Level of Impairment: Conversation Intelligibility: Intelligible Word: 75-100% accurate Phrase: 75-100% accurate Sentence: 75-100% accurate Conversation: 75-100% accurate Motor Planning: Witnin functional limits Motor Speech Errors: Not applicable  Care Tool Care Tool Cognition Expression of Ideas and Wants Expression of Ideas and Wants: Without difficulty (complex and basic) - expresses complex messages without difficulty and with speech that is clear and easy  to understand   Understanding Verbal and Non-Verbal Content Understanding Verbal and Non-Verbal Content: Understands (complex and basic) - clear comprehension without cues or repetitions   Memory/Recall Ability *  first 3 days only       PMSV Assessment  PMSV Trial Intelligibility: Intelligible Word: 75-100% accurate Phrase: 75-100% accurate Sentence: 75-100% accurate Conversation: 75-100% accurate_0  Short Term Goals: Week 1: SLP Short Term Goal 1 (Week 1): Pt will demonstrate mod I use of speech strategies to correct lateral lisp in conversation.  Refer to Care Plan for Long Term Goals  Recommendations for other services: None   Discharge Criteria: Patient will be discharged from SLP if patient refuses treatment 3 consecutive times without medical reason, if treatment goals not met, if there is a change in medical status, if patient makes no progress towards goals or if patient is discharged from hospital.  The above assessment, treatment plan, treatment alternatives and goals were discussed and mutually agreed upon: by patient  Edge Mauger  [facility] 12/30/2020, 4:27 PM

## 2020-12-31 ENCOUNTER — Inpatient Hospital Stay (HOSPITAL_COMMUNITY): Payer: Self-pay

## 2020-12-31 ENCOUNTER — Inpatient Hospital Stay (HOSPITAL_COMMUNITY): Payer: Self-pay | Admitting: Occupational Therapy

## 2020-12-31 DIAGNOSIS — I480 Paroxysmal atrial fibrillation: Secondary | ICD-10-CM

## 2020-12-31 LAB — GLUCOSE, CAPILLARY
Glucose-Capillary: 94 mg/dL (ref 70–99)
Glucose-Capillary: 97 mg/dL (ref 70–99)
Glucose-Capillary: 124 mg/dL — ABNORMAL HIGH (ref 70–99)
Glucose-Capillary: 128 mg/dL — ABNORMAL HIGH (ref 70–99)

## 2020-12-31 MED ORDER — HYDRALAZINE HCL 50 MG PO TABS
75.0000 mg | ORAL_TABLET | Freq: Three times a day (TID) | ORAL | Status: DC
  Administered 2020-12-31 – 2021-01-02 (×6): 75 mg via ORAL
  Filled 2020-12-31 (×6): qty 1

## 2020-12-31 NOTE — Progress Notes (Signed)
Speech Language Pathology Discharge Summary  Patient Details  Name: Adrienne Lane MRN: [id number] Date of Birth: 02-13-1966  Today's Date: 12/31/2020 SLP Individual Time: 0804-0900 SLP Individual Time Calculation (min): 56 min   Skilled Therapeutic Interventions: Skilled ST services focused on speech skills. SLP facilitated awareness of very mild lateral lisp in conversation especially with ending /s/ and /z/ sibilants. Pt was able to monitor and correct errors with supervision A verbal cues fading to mod I. SLP provided education and handout on  /s/, /z/, /ch/ and /sh/ sibilants to utilize in continued practice at then conversation level.  Pt was left in room with call bell within reach and chair alarm set. No further ST services.    Patient has met 1 of 1 long term goals.  Patient to discharge at overall Modified Independent level.  Reasons goals not met:     Clinical Impression/Discharge Summary:   Pt met 1 out 1 goals, discharging at mod I for carryover and self-correction of mild lateral lisp in conversation with 100% intelligiblity. Education was completed and handouts given to continue targeting /s/, /z/, /ch/ and /sh/ sibilants in conversation. Pt benefited from skilled ST services in order to maximize functional independence and no further skilled ST services needed.  Care Partner:  Caregiver Able to Provide Assistance: Yes  Type of Caregiver Assistance: Physical  Recommendation:  None      Equipment: N/A   Reasons for discharge: Treatment goals met   Patient/Family Agrees with Progress Made and Goals Achieved: Yes    Adrienne Lane 12/31/2020, 11:06 AM

## 2020-12-31 NOTE — Patient Care Conference (Signed)
Inpatient RehabilitationTeam Conference and Plan of Care Update Date: 12/31/2020   Time: 11:30 AM    Patient Name: Adrienne Lane      Medical Record Number: [id number]  Date of Birth: 1966/11/18 Sex: Female         Room/Bed: 4M05C/4M05C-01 Payor Info: Payor: /    Admit Date/Time:  12/27/2020  6:42 PM  Primary Diagnosis:  Right pontine cerebrovascular accident [facility])  Hospital Problems: Principal Problem:   Right pontine cerebrovascular accident [facility]) Active Problems:   Hypokalemia   Labile blood glucose   Poorly controlled type 2 diabetes mellitus with peripheral neuropathy ([place])   Stage 3b chronic kidney disease ([place])   Labile blood pressure   PAF (paroxysmal atrial fibrillation) [facility])    Expected Discharge Date: Expected Discharge Date: 01/07/21  Team Members Present: Physician leading conference: Dr. Delice Lesch Care Coodinator Present: Dorien Chihuahua, RN, BSN, CRRN;Becky Dupree, LCSW Nurse Present: Frances Maywood, RN PT Present: Becky Sax, PT OT Present: Simonne Come, OT SLP Present: Charolett Bumpers, SLP PPS Coordinator present : Gunnar Fusi, Novella Olive, PT     Current Status/Progress Goal Weekly Team Focus  Bowel/Bladder   Patient is continent og Bladder/bowel. LBM 12/29/21  Maintain continence  QS/RPN assessment   Swallow/Nutrition/ Hydration             ADL's   min assist bathing/dressing, CGA - Supervision transfers with RW and Min A without AD  Mod I bathing; Supervision dressing, transfers, and homemaking tasks  ADL retraining, LUE NMR, transfers, dynamic standing balance, pt/family education   Mobility   bed mobility supervision, transfers without AD min A/CGA, gait 159ft with RW and CGA, min A steps  supervision  functional mobility/transfers, generalized strengthening, dynamic standing balance/coordination, gait training, stair navigation, safety awareness, and endurance   Communication   Mod I  Mod I - goal met  education speech strategies for  very mild lateral lisp   Safety/Cognition/ Behavioral Observations            Pain   Patient c/o ledt arm shoulder pain, medicated with prn Tylenol $RemoveBef'650mg'[id number]$   < 0  Assess QS/PRN pain   Skin   Skin intact  maintain skin integrity  QS/PRN assessment     Discharge Planning:  Home with husband who is disabled and can assist. Pt is uninsured and will not get follow up. Will work on equipment needs   Team Discussion: BP stable. EKG ordered to assess A-fib. Monitor left shoulder pain Patient on target to meet rehab goals: yes, currently CGA for transfers and car transfers and min assist for steps with rails. Min assist without assistive device for ADLs and min assist for clothes management and toileting. Supervision goals set for discharge.  *See Care Plan and progress notes for long and short-term goals.   Revisions to Treatment Plan:  SLP discharged after addressing mild lateral lisp  Teaching Needs: Transfers, toileting, medications, etc.  Current Barriers to Discharge: Lack of insurance for follow up services,   Possible Resolutions to Barriers: Family education Single point cane Outpatient follow up    Medical Summary Current Status: Left side hemiparesis with facial droop/slurred speech secondary to right pontine lacunar infarction  Barriers to Discharge: Medical stability;Weight  Barriers to Discharge Comments: Hospital doctor for outpatient therapies and DME Possible Resolutions to [employer]: Therapies, follow labs - K+, Cr, optimize DM/BP meds, ECG ordered for eval Afib   Continued Need for Acute Rehabilitation Level of Care: The patient requires daily medical management by a  physician with specialized training in physical medicine and rehabilitation for the following reasons: Direction of a multidisciplinary physical rehabilitation program to maximize functional independence : Yes Medical management of patient stability for increased activity during  participation in an intensive rehabilitation regime.: Yes Analysis of laboratory values and/or radiology reports with any subsequent need for medication adjustment and/or medical intervention. : Yes   I attest that I was present, lead the team conference, and concur with the assessment and plan of the team.   Dorien Chihuahua B 12/31/2020, 4:39 PM

## 2020-12-31 NOTE — Progress Notes (Signed)
Physical Therapy Session Note  Patient Details  Name: Adrienne Lane MRN: [id number] Date of Birth: 1966-04-20  Today's Date: 12/31/2020 PT Individual Time: 9150-5697 PT Individual Time Calculation (min): 68 min   Short Term Goals: Week 1:  PT Short Term Goal 1 (Week 1): Pt will complete least restrictive transfer with CGA consistently PT Short Term Goal 2 (Week 1): Pt will initiate stair training PT Short Term Goal 3 (Week 1): Pt will ambulate x 150 ft with LRAD and CGA consistently  Skilled Therapeutic Interventions/Progress Updates:   Received pt sitting in recliner, pt agreeable to therapy, and denied any pain during session but requested to get dressed. Session with emphasis on dressing, functional mobility/transfers, generalized strengthening, dynamic standing balance/coordination, gait training, NMR, and improved activity tolerance. Pt donned underwear and jeans sitting in recliner and doffed non-skid socks with supervision. Donned R regular sock and shoe with supervision and L sock and shoe with total A. Pt transferred sit<>stand without AD and CGA and able to pull underwear/pants over hips with CGA but required total A to fasten button. Donned bra sitting in recliner with max A to fasten strap and donned pull over shirt with supervision and increased time. Pt transferred recliner<>WC stand<>pivot without AD and CGA and transported to therapy gym in [facility] total A for time management purposes. Pt navigated 12 steps with 2 rails and CGA ascending and descending with a step to pattern with cues for "up with the good, down with the bad" technique. Switched out RW for [facility] and pt ambulated 262ft with SPC and CGA/min A. Pt with mild LOB when turning and when distracted by objects in hallway. Pt demonstrated decreased stride length, decreased weight shifting to R side, and decreased trunk rotation and required cues for R heel strike and increased stride length. Pt transported to dayroom in [facility] total A and  performed BLE strengthening only on Nustep at workload 3 for 4 minutes and 32 seconds for a total of 147ft for improved cardiovascular endurance. Pt reported increased in R knee pain; therefore stopped activity. Pt ambulated additional 130ft with SPC and CGA/min A for balance with same gait pattern and cues mentioned above. Worked on dynamic standing balance/coordination performing toe taps to 4 different colored cones with min/mod handheld assist based off therapist's cues x12 reps. Pt with difficulty with LLE motor control/coordination frequently knocking over cones. Pt able to stand and fix cones without AD and CGA for balance. Pt transferred mat<>WC stand<>pivot without AD and CGA and transported back to room in [facility] total A. Concluded session with pt sitting in recliner, needs within reach, and chair pad alarm on. Therapist provided water for pt.   Therapy Documentation Precautions:  Precautions Precautions: Fall Precaution Comments: L sided weakness Restrictions Weight Bearing Restrictions: No  Therapy/Group: Individual Therapy Alfonse Alpers PT, DPT   12/31/2020, 7:27 AM

## 2020-12-31 NOTE — Progress Notes (Addendum)
[place] PHYSICAL MEDICINE & REHABILITATION PROGRESS NOTE   Subjective/Complaints: Patient seen sitting up in her chair this AM.  She states she slept well overnight.  She is working with therapies.  She states she has questions for the Education officer, museum.   ROS: Denies CP, SOB, N/V/D  Objective:   No results found. Recent Labs    12/29/20 0603  WBC 4.6  HGB 12.0  HCT 36.4  PLT 175   Recent Labs    12/29/20 0603  NA 139  K 3.4*  CL 106  CO2 24  GLUCOSE 125*  BUN 31*  CREATININE 1.77*  CALCIUM 9.3    Intake/Output Summary (Last 24 hours) at 12/31/2020 0855 Last data filed at 12/31/2020 0701 Gross per 24 hour  Intake 1320 ml  Output --  Net 1320 ml        Physical Exam: Vital Signs Blood pressure (!) 155/73, pulse 61, temperature 98.1 F (36.7 C), temperature source Oral, resp. rate 17, height 5\' 8"  (1.727 m), weight 102.4 kg, SpO2 100 %. Constitutional: No distress . Vital signs reviewed. HENT: Normocephalic.  Atraumatic. Eyes: EOMI. No discharge. Cardiovascular: No JVD.  Irregularly irregular. Respiratory: Normal effort.  No stridor.  Bilateral clear to auscultation. GI: Non-distended.  BS +. Skin: Warm and dry.  Intact. Psych: Normal mood.  Normal behavior. Musc: No edema in extremities.  No tenderness in extremities. Neuro: Alert Dysarthria, stable Motor: RUE/RLE: 5/5 proximal distal LUE: Shoulder abduction 3/5, distally 4-4+/5 with apraxia, stable LLE: 4+/5 proximal distal   Assessment/Plan: 1. Functional deficits which require 3+ hours per day of interdisciplinary therapy in a comprehensive inpatient rehab setting.  Physiatrist is providing close team supervision and 24 hour management of active medical problems listed below.  Physiatrist and rehab team continue to assess barriers to discharge/monitor patient progress toward functional and medical goals  Care Tool:  Bathing    Body parts bathed by patient: Right arm,Left arm,Chest,Abdomen,Front  perineal area,Buttocks,Right upper leg,Left upper leg,Face   Body parts bathed by helper: Chest,Abdomen,Buttocks,Right lower leg,Left lower leg,Right arm     Bathing assist Assist Level: Moderate Assistance - Patient 50 - 74%     Upper Body Dressing/Undressing Upper body dressing   What is the patient wearing?: Bra,Pull over shirt    Upper body assist Assist Level: Moderate Assistance - Patient 50 - 74%    Lower Body Dressing/Undressing Lower body dressing      What is the patient wearing?: Underwear/pull up,Pants     Lower body assist Assist for lower body dressing: Moderate Assistance - Patient 50 - 74%     Toileting Toileting    Toileting assist Assist for toileting: Minimal Assistance - Patient > 75%     Transfers Chair/bed transfer  Transfers assist     Chair/bed transfer assist level: Contact Guard/Touching assist     Locomotion Ambulation   Ambulation assist      Assist level: Minimal Assistance - Patient > 75% Assistive device: Walker-rolling Max distance: 160'   Walk 10 feet activity   Assist     Assist level: Contact Guard/Touching assist Assistive device: Walker-rolling   Walk 50 feet activity   Assist    Assist level: Minimal Assistance - Patient > 75% Assistive device: Walker-rolling    Walk 150 feet activity   Assist    Assist level: Minimal Assistance - Patient > 75% Assistive device: Walker-rolling    Walk 10 feet on uneven surface  activity   Assist Walk 10 feet on uneven  surfaces activity did not occur: Safety/medical concerns         Wheelchair     Assist Will patient use wheelchair at discharge?: No             Wheelchair 50 feet with 2 turns activity    Assist            Wheelchair 150 feet activity     Assist          Medical Problem List and Plan: 1.Left side hemiparesis with facial droop/slurred speechsecondary to right pontine lacunar infarction  Continue CIR    Team conference today to discuss current and goals and coordination of care, home and environmental barriers, and discharge planning with nursing, case manager, and therapies. Please see conference note from today as well.  2. Antithrombotics: -DVT/anticoagulation:Pradaxa -antiplatelet therapy: Aspirin 81 mg daily 3. Pain Management:Tylenol as needed 4. Mood:Provide emotional support -antipsychotic agents: N/A 5. Neuropsych: This patientiscapable of making decisions on herown behalf. 6. Skin/Wound Care:Routine skin checks 7. Fluids/Electrolytes/Nutrition:Routine in and outs. 8. Atrial fibrillation. Continue Pradaxa as well as Mexitil 300 mg every 12 hours  ECG ordered to confirm atrial fibrillation 9. Hypertension.    -Coreg 3.125 mg twice daily.    Hydralazine increased to 50 3 times daily on 1/10, increased to 75 3 times daily on 1/12 (100 3 times daily PTA)  Elevated on 1/12 10. Diastolic congestive heart failure. Monitor for any signs of fluid overload Filed Weights   12/29/20 0557 12/30/20 0553 12/31/20 0500  Weight: 103.6 kg 103.5 kg 102.4 kg   Stable on 1/12 11. CKD stage III. Baseline creatinine 1.55-2.38.   -on demadex 40mg  bid at home  Creatinine 1.77 on 1/10, labs ordered for tomorrow  Continue to monitor 12. Diabetes mellitus with peripheral neuropathy. Hemoglobin A1c 8.0.   Lantus insulin 20 units daily.   Farxiga 10mg  daily at home, resumed on 1/9  Relatively controlled on 1/12  Monitor with increased mobility 13. Hyperlipidemia. Lipitor 14. Obesity. BMI 34.06. Dietary follow-up 15.  Hypokalemia  Potassium 3.4 on 1/10, labs ordered for tomorrow  Supplemented x1 day  Continue to monitor  LOS: 4 days A FACE TO FACE EVALUATION WAS PERFORMED  Adrienne Lane Adrienne Lane 12/31/2020, 8:55 AM

## 2020-12-31 NOTE — Progress Notes (Addendum)
Patient ID: Adrienne Lane, female   DOB: [birth year], 55 y.o.   MRN: [id number]  Met with pt to discuss team conference goals of supervision and target discharge of 1/19. She is pleased with her progress but concerned she will not get any follow up therapies. Will try to get charity home health but is not guaranteed. She does go to the [facility] on [address] and can use [facility] pharmacy for her medications. Will try to get equipment for pt prior to discharge.  4:00 Had straight cane which was donated have given to pt

## 2020-12-31 NOTE — Progress Notes (Signed)
Occupational Therapy Session Note  Patient Details  Name: Adrienne Lane MRN: [id number] Date of Birth: 1966-04-16  Today's Date: 12/31/2020 OT Individual Time: 1400-1500 OT Individual Time Calculation (min): 60 min    Short Term Goals: Week 1:  OT Short Term Goal 1 (Week 1): patient iwll complete bathing and dressing with min A OT Short Term Goal 2 (Week 1): patient will complete toileting with min a OT Short Term Goal 3 (Week 1): patient will complete functional transfers with CGA OT Short Term Goal 4 (Week 1): patient will increase ability to hold and manipulate items with left hand 25% OT Short Term Goal 5 (Week 1): patient will complete basic HM with RW CGA  Skilled Therapeutic Interventions/Progress Updates:    Treatment session with focus on functional mobility, functional use of LUE, LUE NMR, and dynamic standing balance.  Pt received upright in recliner with nurse tech assessing vitals.  Pt ambulated to therapy gym with SPC with CGA.  Engaged in [place] visual scanning activity with focus on increased AROM of LUE.  Pt completed visual pursuits in standing to locate numbers 1-30 with LUE, completing in 2:56 with 66.67% accuracy on trial one, increasing to 2:41 and 75% accuracy on trial two.  Discussed reaction time and functional implication with meal prep or other IADLs.  Engaged in further activities to target functional use of LUE with peg board on horizontal surface then progressing to vertical surface to increase challenge.  Pt with improved grasp this session with medium sized pegs, requiring increased time for manipulation but able to complete all.  Challenged pt to replicate pattern, placed in L visual field to facilitate increased L visual scanning and attention.  Pt ambulated back to room and completed toilet transfer with SPC with CGA.  Pt able to unfasten and fasten button on jeans with min cues for problem solving.  Pt returned to recliner and left upright with chair alarm on and  all needs in reach.  Therapy Documentation Precautions:  Precautions Precautions: Fall Precaution Comments: L sided weakness Restrictions Weight Bearing Restrictions: No General:   Vital Signs: Therapy Vitals Temp: 97.7 F (36.5 C) Temp Source: Oral Pulse Rate: (!) 54 Resp: 18 BP: (!) 162/78 Patient Position (if appropriate): Sitting Oxygen Therapy SpO2: 100 % O2 Device: Room Air Pain:  Pt with no c/o pain   Therapy/Group: Individual Therapy  Simonne Come 12/31/2020, 3:10 PM

## 2021-01-01 ENCOUNTER — Inpatient Hospital Stay (HOSPITAL_COMMUNITY): Payer: Self-pay | Admitting: Physical Therapy

## 2021-01-01 ENCOUNTER — Inpatient Hospital Stay (HOSPITAL_COMMUNITY): Payer: Self-pay | Admitting: Occupational Therapy

## 2021-01-01 ENCOUNTER — Inpatient Hospital Stay: Payer: Self-pay | Admitting: Internal Medicine

## 2021-01-01 ENCOUNTER — Inpatient Hospital Stay (HOSPITAL_COMMUNITY): Payer: Self-pay | Admitting: *Deleted

## 2021-01-01 ENCOUNTER — Inpatient Hospital Stay (HOSPITAL_COMMUNITY): Payer: Self-pay

## 2021-01-01 DIAGNOSIS — I499 Cardiac arrhythmia, unspecified: Secondary | ICD-10-CM

## 2021-01-01 LAB — BASIC METABOLIC PANEL
Anion gap: 12 (ref 5–15)
BUN: 33 mg/dL — ABNORMAL HIGH (ref 6–20)
CO2: 21 mmol/L — ABNORMAL LOW (ref 22–32)
Calcium: 9.2 mg/dL (ref 8.9–10.3)
Chloride: 109 mmol/L (ref 98–111)
Creatinine, Ser: 1.52 mg/dL — ABNORMAL HIGH (ref 0.44–1.00)
GFR, Estimated: 41 mL/min — ABNORMAL LOW (ref >60)
Glucose, Bld: 98 mg/dL (ref 70–99)
Potassium: 4 mmol/L (ref 3.5–5.1)
Sodium: 142 mmol/L (ref 135–145)

## 2021-01-01 LAB — GLUCOSE, CAPILLARY
Glucose-Capillary: 89 mg/dL (ref 70–99)
Glucose-Capillary: 104 mg/dL — ABNORMAL HIGH (ref 70–99)
Glucose-Capillary: 115 mg/dL — ABNORMAL HIGH (ref 70–99)
Glucose-Capillary: 170 mg/dL — ABNORMAL HIGH (ref 70–99)

## 2021-01-01 MED ORDER — BLOOD PRESSURE CONTROL BOOK
Freq: Once | Status: AC
  Filled 2021-01-01: qty 1

## 2021-01-01 MED ORDER — LIVING WELL WITH DIABETES BOOK
Freq: Once | Status: AC
  Filled 2021-01-01: qty 1

## 2021-01-01 NOTE — Progress Notes (Signed)
Physical Therapy Session Note  Patient Details  Name: Adrienne Lane MRN: [id number] Date of Birth: 02-27-1966  Today's Date: 01/01/2021 PT Individual Time: 1300-1409 PT Individual Time Calculation (min): 69 min   Short Term Goals: Week 1:  PT Short Term Goal 1 (Week 1): Pt will complete least restrictive transfer with CGA consistently PT Short Term Goal 2 (Week 1): Pt will initiate stair training PT Short Term Goal 3 (Week 1): Pt will ambulate x 150 ft with LRAD and CGA consistently  Skilled Therapeutic Interventions/Progress Updates:   Received pt sitting in recliner, pt agreeable to therapy, and denied any pain during session. Session with emphasis on functional mobility/transfers, generalized strengthening, dynamic standing balance/coordination, gait training, NMR, and improved activity tolerance. Pt ambulated 160ft x 2 trials with SPC and CGA to/from therapy gym. Pt required cues for increased LLE foot clearance and for L heel strike. Pt transferred sit<>stand<>tall kneeling with min A. Attempted to perform kneeling squats to heels but pt reported increased L knee pain. Instead transitioned to lateral stepping on knees x2 laps with BUE support on bench and CGA with emphasis on L hip abduction strength. Pt transitioned resting on elbows<>extended elbows x 6 reps with emphasis on weight bearing through LUE. Pt performed squats 2x10 without UE support and CGA with emphasis on equal weight distribution and motor control using mirror for visual feedback. Worked on dynamic standing balance performing alternating marches without UE support and min/mod A 2x10 with emphasis on lateral weight shifting and eccentric control/coordination. Worked on weight bearing onto L elbow with emphasis on triceps activation 2x10 with CGA. Pt transferred sit<>stand<>quadruped with min A and attempted to perfom alternating leg lifts, however pt unable due to pain in L shoulder and generalized weakness. Pt transferred  quadruped<>R side sitting with min A and sit<>supine with supervision and cues for logroll technique and performed bridges 2x10 with emphasis on pelvic and core control and equal weight distribution through LEs. Pt requested to sit up in between sets of bridges and required water and extensive rest break due to reports of having "hot flash". Pt able to transfer supine<>L sidelying<>sitting EOB with supervision. Pt transferred sit<>stand without UE support x 3 reps with CGA. Pt required increased time and multiple attempts with cues for foot placement and anterior weight shifting. Concluded session with pt sitting in recliner, needs within reach, and chair pad alarm on.  Therapy Documentation Precautions:  Precautions Precautions: Fall Precaution Comments: L sided weakness Restrictions Weight Bearing Restrictions: No  Therapy/Group: Individual Therapy Alfonse Alpers PT, DPT   01/01/2021, 7:29 AM

## 2021-01-01 NOTE — Progress Notes (Signed)
Physical Therapy Session Note  Patient Details  Name: Adrienne Lane MRN: [id number] Date of Birth: 15-Jan-1966  Today's Date: 01/01/2021 PT Individual Time: 0800-0858 PT Individual Time Calculation (min): 58 min   Short Term Goals: Week 1:  PT Short Term Goal 1 (Week 1): Pt will complete least restrictive transfer with CGA consistently PT Short Term Goal 2 (Week 1): Pt will initiate stair training PT Short Term Goal 3 (Week 1): Pt will ambulate x 150 ft with LRAD and CGA consistently  Skilled Therapeutic Interventions/Progress Updates:    pt received in recliner and agreeable to therapy. Pt directed in Sit to stand with Single point cane supervision and gait training with Single point cane to day room 350' CGA with VC for increased step height and length on LLE and trunk extension for improved gait pattern and decrease fall risk. Pt directed in obstacle course with x5 cone weaving, stepping on/off airex pad, and lateral stepping 4', course completed x2 at CGA-min A for stability with Single point cane used throughout and VC given for technique. Pt directed in airex pad dynamic balance training without external support for 2x1 min lateral weight shifts and 2x1 min anterior/posterior weight shifts with x3 each set categorical naming with pt able to compete category with noted decreased pace of weight shifting and need of VC for improved technique, overall CGA. Pt directed in gait training without AD for 200' x2 with increased time spent to encourage and instruct improved trunk rotation and LUE swing to improve global improved gait pattern, initially pt demonstrated poor foot clarence on LLE however improved with cues and reps CGA. Pt directed in lateral side stepping onto Bosu ball x5 each LE min A on LLE and CGA on RLE for improved weight shifting, hip strength, dynamic balance with single leg stance to complete activity. Pt directed in gait training to return to room, Single point cane CGA noted  improved technique with arm swing and VC for improved pace only. Pt left in recliner, alarm set, All needs in reach and in good condition. Call light in hand.    Therapy Documentation Precautions:  Precautions Precautions: Fall Precaution Comments: L sided weakness Restrictions Weight Bearing Restrictions: No General:   Vital Signs: Therapy Vitals Temp: 98.1 F (36.7 C) Temp Source: Oral Pulse Rate: (!) 51 Resp: 17 BP: (!) 160/81 Patient Position (if appropriate): Lying Oxygen Therapy SpO2: 100 % O2 Device: Room Air Pain: Pain Assessment Pain Scale: 0-10 Pain Score: 0-No pain Mobility:   Locomotion :    Trunk/Postural Assessment :    Balance:   Exercises:   Other Treatments:      Therapy/Group: Individual Therapy  Junie Panning 01/01/2021, 8:58 AM

## 2021-01-01 NOTE — Progress Notes (Signed)
[place] [facility] PHYSICAL MEDICINE & REHABILITATION PROGRESS NOTE   Subjective/Complaints: Patient seen ambulating with therapies this morning.  She states she slept very well overnight.  She is questions regarding discharge medications as well as qualifying for Medicare.  Discussed medications and co-pay assistance with pharmacy.  ROS: Denies CP, SOB, N/V/D  Objective:   No results found. No results for input(s): WBC, HGB, HCT, PLT in the last 72 hours. Recent Labs    01/01/21 0507  NA 142  K 4.0  CL 109  CO2 21*  GLUCOSE 98  BUN 33*  CREATININE 1.52*  CALCIUM 9.2    Intake/Output Summary (Last 24 hours) at 01/01/2021 1049 Last data filed at 01/01/2021 0700 Gross per 24 hour  Intake 480 ml  Output -  Net 480 ml        Physical Exam: Vital Signs Blood pressure (!) 160/81, pulse (!) 51, temperature 98.1 F (36.7 C), temperature source Oral, resp. rate 17, height 5\' 8"  (1.727 m), weight 101.7 kg, SpO2 100 %. Constitutional: No distress . Vital signs reviewed. HENT: Normocephalic.  Atraumatic. Eyes: EOMI. No discharge. Cardiovascular: No JVD.  Irregular. Respiratory: Normal effort.  No stridor.  Bilateral clear to auscultation. GI: Non-distended.  BS +. Skin: Warm and dry.  Intact. Psych: Normal mood.  Normal behavior. Musc: No edema in extremities.  No tenderness in extremities. Neuro: Alert Dysarthria, stable Motor: RUE/RLE: 5/5 proximal distal LUE: Shoulder abduction 3/5, distally 4-4+/5 with apraxia, improving LLE: 4+/5 proximal distal   Assessment/Plan: 1. Functional deficits which require 3+ hours per day of interdisciplinary therapy in a comprehensive inpatient rehab setting.  Physiatrist is providing close team supervision and 24 hour management of active medical problems listed below.  Physiatrist and rehab team continue to assess barriers to discharge/monitor patient progress toward functional and medical goals  Care Tool:  Bathing    Body parts bathed  by patient: Right arm,Left arm,Chest,Abdomen,Front perineal area,Buttocks,Right upper leg,Left upper leg,Face   Body parts bathed by helper: Chest,Abdomen,Buttocks,Right lower leg,Left lower leg,Right arm     Bathing assist Assist Level: Moderate Assistance - Patient 50 - 74%     Upper Body Dressing/Undressing Upper body dressing   What is the patient wearing?: Pull over shirt,Bra    Upper body assist Assist Level: Moderate Assistance - Patient 50 - 74%    Lower Body Dressing/Undressing Lower body dressing      What is the patient wearing?: Underwear/pull up,Pants     Lower body assist Assist for lower body dressing: Minimal Assistance - Patient > 75%     Toileting Toileting    Toileting assist Assist for toileting: Supervision/Verbal cueing     Transfers Chair/bed transfer  Transfers assist     Chair/bed transfer assist level: Contact Guard/Touching assist     Locomotion Ambulation   Ambulation assist      Assist level: Minimal Assistance - Patient > 75% Assistive device: Cane-straight Max distance: 223ft   Walk 10 feet activity   Assist     Assist level: Minimal Assistance - Patient > 75% Assistive device: Cane-straight   Walk 50 feet activity   Assist    Assist level: Minimal Assistance - Patient > 75% Assistive device: Cane-straight    Walk 150 feet activity   Assist    Assist level: Minimal Assistance - Patient > 75% Assistive device: Cane-straight    Walk 10 feet on uneven surface  activity   Assist Walk 10 feet on uneven surfaces activity did not occur: Safety/medical concerns  Wheelchair     Assist Will patient use wheelchair at discharge?: No             Wheelchair 50 feet with 2 turns activity    Assist            Wheelchair 150 feet activity     Assist          Medical Problem List and Plan: 1.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 Antithrombotics: -DVT/anticoagulation:Pradaxa -antiplatelet therapy: Aspirin 81 mg daily 3. Pain Management:Tylenol as needed 4. Mood:Provide emotional support -antipsychotic agents: N/A 5. Neuropsych: This patientiscapable of making decisions on herown behalf. 6. Skin/Wound Care:Routine skin checks 7. Fluids/Electrolytes/Nutrition:Routine in and outs. 8. Atrial fibrillation. Continue Pradaxa as well as Mexitil 300 mg every 12 hours  ECG showing PVCs and PACs- we will follow-up with cards. 9. Hypertension.    -Coreg 3.125 mg twice daily.    Hydralazine increased to 50 3 times daily on 1/10, increased to 75 3 times daily on 1/12 (100 3 times daily PTA)  Remains elevated on 1/13, will consider further increase in medications tomorrow if persistent 10. Diastolic congestive heart failure. Monitor for any signs of fluid overload Filed Weights   12/30/20 0553 12/31/20 0500 01/01/21 0512  Weight: 103.5 kg 102.4 kg 101.7 kg   Improving on 1/13 11. CKD stage III. Baseline creatinine 1.55-2.38.   -on demadex 40mg  bid at home  Creatinine 1.52 on 1/13  Continue to monitor 12. Diabetes mellitus with peripheral neuropathy. Hemoglobin A1c 8.0.   Lantus insulin 20 units daily.   Farxiga 10mg  daily at home, resumed on 1/9  Slightly labile on 1/13  Monitor with increased mobility 13. Hyperlipidemia. Lipitor 14. Obesity. BMI 34.06. Dietary follow-up 15.  Hypokalemia  Potassium 4.0 on 1/13  Supplemented x1 day on 1/10  Continue to monitor  LOS: 5 days A FACE TO FACE EVALUATION WAS PERFORMED  Ankit Lorie Phenix 01/01/2021, 10:49 AM

## 2021-01-01 NOTE — Evaluation (Signed)
Recreational Therapy Assessment and Plan  Patient Details  Name: Adrienne Lane MRN: [id number] Date of Birth: 10-02-1966 Today's Date: 01/01/2021  Rehab Potential:  Good ELOS:   d/c 01/07/21 Assessment   Hospital Problem: Principal Problem:   Right pontine cerebrovascular accident [facility])   Past Medical History:      Past Medical History:  Diagnosis Date  . Atrial fibrillation with RVR ([place])   . [place] 01/2020  . CHF (congestive heart failure) ([place]) 10/20/2019  . CKD (chronic kidney disease), stage IV ([place])   . Diabetes mellitus without complication ([place])   . DOE (dyspnea on exertion)    walking upstairs or up hill resolves in one minute  . Fibroids   . History of kidney stones   . History of recent blood transfusion 02/26/2020  . Hypertension   . Iron deficiency anemia   . Non-ischemic cardiomyopathy ([facility])    tachycardia induced  . Obese   . Peripheral edema   . Premature ventricular contractions (PVCs) ([facility])   . Umbilical hernia   . Wears glasses    Past Surgical History:       Past Surgical History:  Procedure Laterality Date  . CHOLECYSTECTOMY    . CYSTOSCOPY W/ URETERAL STENT PLACEMENT Bilateral 05/24/2020   Procedure: CYSTOSCOPY WITH RETROGRADE PYELOGRAM/URETERAL STENT PLACEMENT;  Surgeon: Robley Fries, MD;  Location: [facility];  Service: Urology;  Laterality: Bilateral;  . CYSTOSCOPY/RETROGRADE/URETEROSCOPY Bilateral 04/03/2020   Procedure: CYSTOSCOPY/RETROGRADE/URETEROSCOPY;  Surgeon: Ardis Hughs, MD;  Location: [facility];  Service: Urology;  Laterality: Bilateral;  . HYSTERECTOMY ABDOMINAL WITH SALPINGO-OOPHORECTOMY  07/21/2020   Procedure: HYSTERECTOMY ABDOMINAL WITH BILATERAL SALPINGO-OOPHORECTOMY;  Surgeon: Sanjuana Kava, MD;  Location: [facility];  Service: Gynecology;;  . IR FLUORO GUIDE CV LINE RIGHT  02/21/2020  . IR US GUIDE VASC ACCESS RIGHT  02/21/2020  . NEPHROLITHOTOMY Left 02/14/2020   Procedure: NEPHROLITHOTOMY PERCUTANEOUS/  SURGEON ACCESS/ LEFT PERCUTANEOUS NEPHROSTOMY TUBE PLACEMENT;  Surgeon: Ardis Hughs, MD;  Location: [facility];  Service: Urology;  Laterality: Left;  . NEPHROLITHOTOMY Left 02/21/2020   Procedure: NEPHROLITHOTOMY PERCUTANEOUS SECOND LOOK;  Surgeon: Ardis Hughs, MD;  Location: [facility];  Service: Urology;  Laterality: Left;  . NEPHROLITHOTOMY Left 02/26/2020   Procedure: NEPHROLITHOTOMY PERCUTANEOUS;  Surgeon: Ceasar Mons, MD;  Location: [facility];  Service: Urology;  Laterality: Left;  NEED 150 MIN  . NEPHROLITHOTOMY Right 03/24/2020   Procedure: NEPHROLITHOTOMY PERCUTANEOUS WITH ACCESS LEFT STENT REMOVAL;  Surgeon: Ardis Hughs, MD;  Location: [facility];  Service: Urology;  Laterality: Right;  . RIGHT HEART CATH N/A 11/09/2019   Procedure: RIGHT HEART CATH;  Surgeon: Jolaine Artist, MD;  Location: [facility];  Service: Cardiovascular;  Laterality: N/A;  . WISDOM TOOTH EXTRACTION      Assessment & Plan Clinical Impression:  Adrienne Lane a 55 year old right-handed female with history of atrial fibrillation RVR maintained on Eliquis, diastolic congestive heart failure, CKD stage III, diabetes mellitus, obesity with BMI 34.06, hypertension, cardiomyopathy. Per chart review lives with spouse and also mother-in-law and son. Mobile home 5 steps to entry. Reportedly independent prior to admission. Presented 12/24/2020 with left-sided weakness facial droop and slurred speech. CT/MRI showed acute infarct right hemipons as well as additional small acute infarct left frontal subcortical white matter. Small remote lacunar infarct in the right corona radiata and left periatrial white matter. Patient did not receive TPA. MRA of the head negative. Echocardiogram with ejection fraction of 45 to 50% without thrombus. Admission chemistries unremarkable except  potassium 3.3 glucose 305 BUN 32 creatinine 2.12 urine drug screen negative. Neurology follow-up patient's  Eliquis was changed to Pradaxa for CVA prophylaxis as well as the addition of low-dose aspirin.. Maintain on a regular diet. Due to patient's left-sided weakness facial droop slurred speech and decreased functional mobility she was admitted for a comprehensive rehab program. Patient transferred to CIR on 12/27/2020 .   Pt presents with decreased activity tolerance, decreased functional mobility, decreased balance Limiting pt's independence with leisure/community pursuits.  Met with pt today for assessment and education.  Discussion included identification of leisure interest, activity analysis/modifications and stress management.  Pt describes being very active and involved with her family and church, loves to cook.  Discussed/provided education on activity modifications for church activities and cooking within the home.  Pt stated understanding and appreciation for education.  Discussed cooking interest with oncoming OT to address and practice modifications in the ADL apartment at a later time.  Also discussed stress, sources of stress, coping strategies to assist with management.  Pt shares that she is more stressed about finances than anything else.  Encouraged pt to consider how she could support those that are financially assisting her right now.  (Praying for them, assisting with meal prep, etc)  Pt stated understanding.  Pt stated excitement about upcoming discharge.   Plan   No further TR Recommendations for other services: None   Discharge Criteria: Patient will be discharged from TR if patient refuses treatment 3 consecutive times without medical reason.  If treatment goals not met, if there is a change in medical status, if patient makes no progress towards goals or if patient is discharged from hospital.  The above assessment, treatment plan, treatment alternatives and goals were discussed and mutually agreed upon: by patient  [place] 01/01/2021, 3:06 PM

## 2021-01-01 NOTE — Progress Notes (Signed)
Occupational Therapy Session Note  Patient Details  Name: Adrienne Lane MRN: [id number] Date of Birth: 01-May-1966  Today's Date: 01/01/2021 OT Individual Time: 1005-1100 OT Individual Time Calculation (min): 55 min    Short Term Goals: Week 1:  OT Short Term Goal 1 (Week 1): patient iwll complete bathing and dressing with min A OT Short Term Goal 2 (Week 1): patient will complete toileting with min a OT Short Term Goal 3 (Week 1): patient will complete functional transfers with CGA OT Short Term Goal 4 (Week 1): patient will increase ability to hold and manipulate items with left hand 25% OT Short Term Goal 5 (Week 1): patient will complete basic HM with RW CGA  Skilled Therapeutic Interventions/Progress Updates:    Patient finishing up in the bathroom with staff upon arrival.  She is able to ambulate with SPC CGA to sink for hand hygiene.  She denies pain and is motivated to attend therapy session.  Sit to stand and ambulation with SPC to/from recliner, therapy gym spaces / mat table, arm chair with CGA - occ steadying assist for turns and avoiding obstacles.  Completed trunk and shoulder stretch/posture activities.  Replaced shoe laces with elastic laces - she is able to donn/doff sneakers mod I.  Reviewed and practiced reach and transport of items in the kitchen area - problem solved and practiced when to use left hand for reach and holding objects and when to use as support - she is able to carry items in a bag with left hand with relative ease.  Provided suggestions for cutting - use of rocker knife, onion holder, spike board - she was able to look these items up on her phone and demonstrates good understanding.   Also discussed use of items that she already has at home such and food processor.  She returned to recliner at close of session, call bell and tray table in reach.    Therapy Documentation Precautions:  Precautions Precautions: Fall Precaution Comments: L sided  weakness Restrictions Weight Bearing Restrictions: No  Therapy/Group: Individual Therapy  Carlos Levering 01/01/2021, 7:34 AM

## 2021-01-02 ENCOUNTER — Inpatient Hospital Stay (HOSPITAL_COMMUNITY): Payer: Self-pay

## 2021-01-02 ENCOUNTER — Inpatient Hospital Stay (HOSPITAL_COMMUNITY): Payer: Self-pay | Admitting: Occupational Therapy

## 2021-01-02 ENCOUNTER — Inpatient Hospital Stay (HOSPITAL_COMMUNITY): Payer: Self-pay | Admitting: Physical Therapy

## 2021-01-02 DIAGNOSIS — J029 Acute pharyngitis, unspecified: Secondary | ICD-10-CM

## 2021-01-02 DIAGNOSIS — K5901 Slow transit constipation: Secondary | ICD-10-CM

## 2021-01-02 LAB — GLUCOSE, CAPILLARY
Glucose-Capillary: 88 mg/dL (ref 70–99)
Glucose-Capillary: 237 mg/dL — ABNORMAL HIGH (ref 70–99)
Glucose-Capillary: 118 mg/dL — ABNORMAL HIGH (ref 70–99)
Glucose-Capillary: 177 mg/dL — ABNORMAL HIGH (ref 70–99)
Glucose-Capillary: 124 mg/dL — ABNORMAL HIGH (ref 70–99)

## 2021-01-02 LAB — CBC WITH DIFFERENTIAL/PLATELET
Abs Immature Granulocytes: 0.02 10*3/uL (ref 0.00–0.07)
Basophils Absolute: 0 10*3/uL (ref 0.0–0.1)
Basophils Relative: 1 %
Eosinophils Absolute: 0 10*3/uL (ref 0.0–0.5)
Eosinophils Relative: 1 %
HCT: 36.9 % (ref 36.0–46.0)
Hemoglobin: 12.3 g/dL (ref 12.0–15.0)
Immature Granulocytes: 1 %
Lymphocytes Relative: 10 %
Lymphs Abs: 0.5 10*3/uL — ABNORMAL LOW (ref 0.7–4.0)
MCH: 28.9 pg (ref 26.0–34.0)
MCHC: 33.3 g/dL (ref 30.0–36.0)
MCV: 86.6 fL (ref 80.0–100.0)
Monocytes Absolute: 0.6 10*3/uL (ref 0.1–1.0)
Monocytes Relative: 14 %
Neutro Abs: 3.2 10*3/uL (ref 1.7–7.7)
Neutrophils Relative %: 73 %
Platelets: 174 10*3/uL (ref 150–400)
RBC: 4.26 MIL/uL (ref 3.87–5.11)
RDW: 13.6 % (ref 11.5–15.5)
WBC: 4.3 10*3/uL (ref 4.0–10.5)
nRBC: 0 % (ref 0.0–0.2)

## 2021-01-02 MED ORDER — PHENOL 1.4 % MT LIQD: 1.0000 | OROMUCOSAL | Status: DC | PRN

## 2021-01-02 MED ORDER — VANCOMYCIN HCL 2000 MG/400ML IV SOLN
2000.0000 mg | Freq: Once | INTRAVENOUS | Status: AC
  Administered 2021-01-02: 2000 mg via INTRAVENOUS
  Filled 2021-01-02: qty 400

## 2021-01-02 MED ORDER — HYDRALAZINE HCL 50 MG PO TABS
100.0000 mg | ORAL_TABLET | Freq: Three times a day (TID) | ORAL | Status: DC
  Administered 2021-01-02 – 2021-01-03 (×4): 100 mg via ORAL
  Filled 2021-01-02 (×4): qty 2

## 2021-01-02 MED ORDER — SENNOSIDES-DOCUSATE SODIUM 8.6-50 MG PO TABS
1.0000 | ORAL_TABLET | Freq: Every day | ORAL | Status: DC
  Administered 2021-01-02: 1 via ORAL
  Filled 2021-01-02: qty 1

## 2021-01-02 MED ORDER — PIPERACILLIN-TAZOBACTAM 3.375 G IVPB
3.3750 g | Freq: Three times a day (TID) | INTRAVENOUS | Status: DC
  Administered 2021-01-03 (×3): 3.375 g via INTRAVENOUS
  Filled 2021-01-02 (×5): qty 50

## 2021-01-02 MED ORDER — POLYETHYLENE GLYCOL 3350 17 G PO PACK
17.0000 g | PACK | Freq: Two times a day (BID) | ORAL | Status: DC
  Administered 2021-01-02 – 2021-01-03 (×3): 17 g via ORAL
  Filled 2021-01-02 (×3): qty 1

## 2021-01-02 MED ORDER — VANCOMYCIN HCL IN DEXTROSE 1-5 GM/200ML-% IV SOLN
1000.0000 mg | Freq: Two times a day (BID) | INTRAVENOUS | Status: DC
  Administered 2021-01-03: 1000 mg via INTRAVENOUS
  Filled 2021-01-02 (×2): qty 200

## 2021-01-02 NOTE — Progress Notes (Signed)
Physical Therapy Session Note  Patient Details  Name: Adrienne Lane MRN: [id number] Date of Birth: January 26, 1966  Today's Date: 01/02/2021 PT Individual Time: 0803-0900 PT Individual Time Calculation (min): 57 min   Short Term Goals: Week 1:  PT Short Term Goal 1 (Week 1): Pt will complete least restrictive transfer with CGA consistently PT Short Term Goal 2 (Week 1): Pt will initiate stair training PT Short Term Goal 3 (Week 1): Pt will ambulate x 150 ft with LRAD and CGA consistently  Skilled Therapeutic Interventions/Progress Updates:    pt received in recliner and agreeable to therapy. Pt denies pain at start of session however reported feeling constipated and uncomfortable from that but no pain. Pt directed in Sit to stand from recliner with Single point cane CGA, gait training with Single point cane to gym 150' CGA with VC for trunk rotation, B arm swing, and increased step height on LLE. Pt directed in clothes pin activity with x10, x15 pins of various resistances clipped to pt's clothing at various heights with emphasis on reaching cross midline at different heights with LUE for improved standing balance, one slight LOB anteriorly while reaching to lowest clothes pin on pant leg min A to correct however grossly CGA. Pt required rest breaks post sets 2/2 fatigue. Pt directed in x10 horseshoe retrieval from different locations in gym at various heights with emphasis on reaching at various heights, inside and outside BOS and use of LUE to complete tasks however instructed to use RUE for retrieval over shoulder height. Pt directed in lateral stepping on Bosu ball x10 R and L LE min A to complete. Pt directed gait training without AD to return to room CGA with VC for trunk extension, increased step length and height and arm swings for 150' and returned to recliner, alarm set, All needs in reach and in good condition. Call light in hand.  Pt reported slight throat soreness that she started feeling last  night and slight cough and nursing made aware.   Therapy Documentation Precautions:  Precautions Precautions: Fall Precaution Comments: L sided weakness Restrictions Weight Bearing Restrictions: No General:   Vital Signs: Therapy Vitals Temp: 98.3 F (36.8 C) Temp Source: Oral Pulse Rate: 63 Resp: 18 BP: (!) 142/63 Patient Position (if appropriate): Sitting Oxygen Therapy SpO2: 100 % O2 Device: Room Air Pain: Pain Assessment Pain Scale: 0-10 Pain Score: 0-No pain Mobility:   Locomotion :    Trunk/Postural Assessment :    Balance:   Exercises:   Other Treatments:      Therapy/Group: Individual Therapy  Junie Panning 01/02/2021, 10:51 AM

## 2021-01-02 NOTE — Progress Notes (Signed)
Physical Therapy Session Note  Patient Details  Name: Adrienne Lane MRN: [id number] Date of Birth: [birth year]  Today's Date: 01/02/2021 PT Individual Time: 1000-1055 PT Individual Time Calculation (min): 55 min   Short Term Goals: Week 1:  PT Short Term Goal 1 (Week 1): Pt will complete least restrictive transfer with CGA consistently PT Short Term Goal 2 (Week 1): Pt will initiate stair training PT Short Term Goal 3 (Week 1): Pt will ambulate x 150 ft with LRAD and CGA consistently  Skilled Therapeutic Interventions/Progress Updates:    Patient received sitting up in recliner, agreeable to PT. She denies pain, but endorses fatigue. She was able to ambulate to therapy gym, ~114ft with SPC and MinA. Intermittent L foot drag resulting in LOB and up to [place] from PT to recover. Sit <> stand completed from low height mat without UE support. Patient requiring consistent CGA for sit <> stand. Stagger stance sit <> stand with R LE anterior to allow for greater emphasis on L LE. Patient with greater difficulty and instability with this. Dynamic standing balance task completed with L UE reaching to grab clothes pin, cross midline to place on basketball hoop. Increased emphasis on weight shift L to complete task. Patient maintaining very wide BOS to improve her stability throughout task. Patient ambulating back to room with SPC and CGA. Requesting to use bathroom. Patient able to complete clothing management in standing with supervision. NT aware of patients location.    Therapy Documentation Precautions:  Precautions Precautions: Fall Precaution Comments: L sided weakness Restrictions Weight Bearing Restrictions: No    Therapy/Group: Individual Therapy  Karoline Caldwell, PT, DPT, CBIS  01/02/2021, 7:36 AM

## 2021-01-02 NOTE — Progress Notes (Addendum)
[facility] PHYSICAL MEDICINE & REHABILITATION PROGRESS NOTE   Subjective/Complaints: Patient seen sitting up in her chair this AM.  She states she slept well overnight.  She complains of a sore throat and constipation.  ROS: + Sore throat, constipation.  Denies CP, SOB, N/V/D, fevers, chills, night sweats, changes in smell taste or vision.  Objective:   No results found. No results for input(s): WBC, HGB, HCT, PLT in the last 72 hours. Recent Labs    01/01/21 0507  NA 142  K 4.0  CL 109  CO2 21*  GLUCOSE 98  BUN 33*  CREATININE 1.52*  CALCIUM 9.2    Intake/Output Summary (Last 24 hours) at 01/02/2021 0956 Last data filed at 01/02/2021 0730 Gross per 24 hour  Intake 460 ml  Output --  Net 460 ml        Physical Exam: Vital Signs Blood pressure (!) 142/63, pulse 63, temperature 98.3 F (36.8 C), temperature source Oral, resp. rate 18, height 5\' 8"  (1.727 m), weight 104.6 kg, SpO2 100 %. Constitutional: No distress . Vital signs reviewed. HENT: Normocephalic.  Atraumatic. Eyes: EOMI. No discharge. Cardiovascular: No JVD.  Irregular. Respiratory: Normal effort.  No stridor.  Bilateral clear to auscultation. GI: Non-distended.  BS +. Skin: Warm and dry.  Intact. Psych: Normal mood.  Normal behavior. Musc: No edema in extremities.  No tenderness in extremities. Neuro: Alert Dysarthria, unchanged Dysphonia Motor: RUE/RLE: 5/5 proximal distal LUE: Shoulder abduction 3/5, distally 4-4+/5 with apraxia, stable LLE: 4+/5 proximal distal   Assessment/Plan: 1. Functional deficits which require 3+ hours per day of interdisciplinary therapy in a comprehensive inpatient rehab setting.  Physiatrist is providing close team supervision and 24 hour management of active medical problems listed below.  Physiatrist and rehab team continue to assess barriers to discharge/monitor patient progress toward functional and medical goals  Care Tool:  Bathing    Body parts bathed by  patient: Right arm,Left arm,Chest,Abdomen,Front perineal area,Buttocks,Right upper leg,Left upper leg,Face   Body parts bathed by helper: Chest,Abdomen,Buttocks,Right lower leg,Left lower leg,Right arm     Bathing assist Assist Level: Moderate Assistance - Patient 50 - 74%     Upper Body Dressing/Undressing Upper body dressing   What is the patient wearing?: Pull over shirt,Bra    Upper body assist Assist Level: Moderate Assistance - Patient 50 - 74%    Lower Body Dressing/Undressing Lower body dressing      What is the patient wearing?: Underwear/pull up,Pants     Lower body assist Assist for lower body dressing: Minimal Assistance - Patient > 75%     Toileting Toileting    Toileting assist Assist for toileting: Supervision/Verbal cueing     Transfers Chair/bed transfer  Transfers assist     Chair/bed transfer assist level: Contact Guard/Touching assist     Locomotion Ambulation   Ambulation assist      Assist level: Minimal Assistance - Patient > 75% Assistive device: Cane-straight Max distance: 112ft   Walk 10 feet activity   Assist     Assist level: Minimal Assistance - Patient > 75% Assistive device: Cane-straight   Walk 50 feet activity   Assist    Assist level: Minimal Assistance - Patient > 75% Assistive device: Cane-straight    Walk 150 feet activity   Assist    Assist level: Minimal Assistance - Patient > 75% Assistive device: Cane-straight    Walk 10 feet on uneven surface  activity   Assist Walk 10 feet on uneven surfaces activity did  not occur: Safety/medical concerns         Wheelchair     Assist Will patient use wheelchair at discharge?: No             Wheelchair 50 feet with 2 turns activity    Assist            Wheelchair 150 feet activity     Assist          Medical Problem List and Plan: 1.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 Antithrombotics: -DVT/anticoagulation:Pradaxa  CBC ordered for Monday -antiplatelet therapy: Aspirin 81 mg daily 3. Pain Management:Tylenol as needed 4. Mood:Provide emotional support -antipsychotic agents: N/A 5. Neuropsych: This patientiscapable of making decisions on herown behalf. 6. Skin/Wound Care:Routine skin checks 7. Fluids/Electrolytes/Nutrition:Routine in and outs. 8. Atrial fibrillation. Continue Pradaxa as well as Mexitil 300 mg every 12 hours  ECG showing PVCs and PACs- no changes per Cards. 9. Hypertension.    -Coreg 3.125 mg twice daily.    Hydralazine increased to 50 3 times daily on 1/10, increased to 75 3 times daily on 1/12, increased to 100 TID on 1/14 (100 3 times daily PTA)  ?Elevated and labile on 1/14 10. Diastolic congestive heart failure. Monitor for any signs of fluid overload Filed Weights   12/31/20 0500 01/01/21 0512 01/02/21 0404  Weight: 102.4 kg 101.7 kg 104.6 kg   ?Reliability on 1/14 11. CKD stage III. Baseline creatinine 1.55-2.38.   -on demadex 40mg  bid at home  Creatinine 1.52 on 1/13, labs ordered for Monday  Continue to monitor 12. Diabetes mellitus with peripheral neuropathy. Hemoglobin A1c 8.0.   Lantus insulin 20 units daily.   Farxiga 10mg  daily at home, resumed on 1/9  Labile on 1/14, monitor for trend  Monitor with increased mobility 13. Hyperlipidemia. Lipitor 14. Obesity. BMI 34.06. Dietary follow-up 15.  Hypokalemia  Potassium 4.0 on 1/13, labs ordered for Monday  Supplemented x1 day on 1/10  Continue to monitor 16.  Slow transit constipation  Bowel meds increased on 114 17.  Sore throat  Chloraseptic Spray ordered  LOS: 6 days A FACE TO FACE EVALUATION WAS PERFORMED  Stanly Si Lorie Phenix 01/02/2021, 9:56 AM

## 2021-01-02 NOTE — Progress Notes (Signed)
   01/02/21 2100  Charting Type  Charting Type Reassessment  Assessment of needs addressed Yes  Orders Chart Check (once per shift) Completed  Neurological  Neuro (WDL) X  Orientation Level Oriented X4  HEENT  HEENT (WDL) X  Vision Check No  L Eye Blurred vision  Respiratory  Respiratory (WDL) WDL  Cough Productive  Sputum Amount Small  Cardiac  Cardiac (WDL) X  Pulse Irregular  Vascular  Vascular (WDL) WDL  Psychosocial  Psychosocial (WDL) WDL  Patient Behaviors Appropriate for age

## 2021-01-02 NOTE — Progress Notes (Signed)
Pt has c/o scratchy throat and chills. Temperature 100.8. Contacted on call MD CBC, blood culture, urine culture , and chest xray ordered. Provided tylenol as well. Will reassess temp in 30 minutes.

## 2021-01-02 NOTE — Progress Notes (Signed)
Pharmacy Antibiotic Note  Adrienne Lane is a 55 y.o. female admitted on 12/27/2020 with fever of unknown source.  Pharmacy has been consulted for Vancomycin and Zosyn dosing.  New fever of Tmax 101, scratchy throat and chills. SCr 1.52 (trending down). Making urine. WBC within normal limits.  CXR: Low lung volumes with minimal atelectasis right base.  Plan: Zosyn 3.375g IV q8h (4 hour infusion).  Vancomycin 2000 mg x1 then 1000 mg IV every 24 hours Goal AUC 400-550. Expected AUC: 470 SCr used: 1.52  Height: 5\' 8"  (172.7 cm) Weight: 104.6 kg (230 lb 9.6 oz) IBW/kg (Calculated) : 63.9  Temp (24hrs), Avg:100.2 F (37.9 C), Min:98.3 F (36.8 C), Max:101.7 F (38.7 C)  Recent Labs  Lab 12/27/20 0640 12/29/20 0603 01/01/21 0507  WBC  --  4.6  --   CREATININE 1.81* 1.77* 1.52*    Estimated Creatinine Clearance: 54.8 mL/min (A) (by C-G formula based on SCr of 1.52 mg/dL (H)).    Allergies  Allergen Reactions  . Adhesive [Tape]     Tears skin, can tolerate paper tape    Antimicrobials this admission: Vancomycin 1/14 >> Zosyn 1/14 >>  Dose adjustments this admission:   Microbiology results: 1/14 Blood >> 1/14 Urine >> 1/5 COVID/Flu negative  Thank you for allowing pharmacy to be a part of this patient's care.  Sloan Leiter, PharmD, BCPS, BCCCP Clinical Pharmacist Please refer to [facility] for [place] numbers 01/02/2021 9:23 PM

## 2021-01-02 NOTE — Progress Notes (Signed)
Occupational Therapy Session Note  Patient Details  Name: Adrienne Lane MRN: [id number] Date of Birth: 1966/10/17  Today's Date: 01/02/2021 OT Individual Time: 1400-1500 OT Individual Time Calculation (min): 60 min    Short Term Goals: Week 1:  OT Short Term Goal 1 (Week 1): patient iwll complete bathing and dressing with min A OT Short Term Goal 2 (Week 1): patient will complete toileting with min a OT Short Term Goal 3 (Week 1): patient will complete functional transfers with CGA OT Short Term Goal 4 (Week 1): patient will increase ability to hold and manipulate items with left hand 25% OT Short Term Goal 5 (Week 1): patient will complete basic HM with RW CGA  Skilled Therapeutic Interventions/Progress Updates:    Treatment session with focus on functional mobility and functional use of LUE.  Pt received upright in recliner reporting fatigue and cold, but agreeable to therapy in the room this session.  Therapist provided pt with small manipulatives and engaged in table top tasks to address LUE.  Pt able to stack and [employer] pieces with L hand with increased time and dropping 10% of checkers.  Attempted to engage in Connect 4 with grid on table, however too high to reach with decreased shoulder flexion therefore removed and placed on therapist's lap to allow for improved reach and functional use of L hand.  Pt demonstrating difficulty picking up checkers from table top and then when manipulating to place in slot, dropping 10%.  Provided pt with small rocks and encouraged picking up from table and placing in palm.  Pt with good success picking up from table, but difficulty when attempting to translate from palm back to fingertips to place on table.  Encouraged pt to continue use of theraputty, foam blocks, and rocks with focus on fine motor control.  Pt reports need to toilet.  Pt ambulated to toilet with SPC with CGA.  Pt completed clothing management and hygiene with supervision.  Pt  returned to recliner and remained upright with chair alarm on and all needs in reach.  Therapy Documentation Precautions:  Precautions Precautions: Fall Precaution Comments: L sided weakness Restrictions Weight Bearing Restrictions: No General:   Vital Signs: Therapy Vitals Temp: 99.2 F (37.3 C) Temp Source: Oral Pulse Rate: 79 Resp: 20 BP: (!) 156/67 Patient Position (if appropriate): Sitting Oxygen Therapy SpO2: 100 % O2 Device: Room Air Pain:  Pt with no c/o pain   Therapy/Group: Individual Therapy  Simonne Come 01/02/2021, 3:21 PM

## 2021-01-03 ENCOUNTER — Inpatient Hospital Stay (HOSPITAL_COMMUNITY)
Admission: AD | Admit: 2021-01-03 | Discharge: 2021-01-06 | DRG: 178 | Disposition: A | Discharge status: Home / Self Care | Payer: Self-pay | Source: Ambulatory Visit | Attending: Internal Medicine | Admitting: Internal Medicine

## 2021-01-03 DIAGNOSIS — I6350 Cerebral infarction due to unspecified occlusion or stenosis of unspecified cerebral artery: Secondary | ICD-10-CM

## 2021-01-03 DIAGNOSIS — J9811 Atelectasis: Secondary | ICD-10-CM | POA: Diagnosis present

## 2021-01-03 DIAGNOSIS — N183 Chronic kidney disease, stage 3 unspecified: Secondary | ICD-10-CM | POA: Diagnosis present

## 2021-01-03 DIAGNOSIS — E1165 Type 2 diabetes mellitus with hyperglycemia: Secondary | ICD-10-CM | POA: Diagnosis present

## 2021-01-03 DIAGNOSIS — Z79899 Other long term (current) drug therapy: Secondary | ICD-10-CM

## 2021-01-03 DIAGNOSIS — I480 Paroxysmal atrial fibrillation: Secondary | ICD-10-CM | POA: Diagnosis present

## 2021-01-03 DIAGNOSIS — R0602 Shortness of breath: Secondary | ICD-10-CM

## 2021-01-03 DIAGNOSIS — E1122 Type 2 diabetes mellitus with diabetic chronic kidney disease: Secondary | ICD-10-CM | POA: Diagnosis present

## 2021-01-03 DIAGNOSIS — I639 Cerebral infarction, unspecified: Secondary | ICD-10-CM | POA: Diagnosis present

## 2021-01-03 DIAGNOSIS — Z8249 Family history of ischemic heart disease and other diseases of the circulatory system: Secondary | ICD-10-CM

## 2021-01-03 DIAGNOSIS — N184 Chronic kidney disease, stage 4 (severe): Secondary | ICD-10-CM | POA: Diagnosis present

## 2021-01-03 DIAGNOSIS — E669 Obesity, unspecified: Secondary | ICD-10-CM | POA: Diagnosis present

## 2021-01-03 DIAGNOSIS — Z833 Family history of diabetes mellitus: Secondary | ICD-10-CM

## 2021-01-03 DIAGNOSIS — U071 COVID-19: Principal | ICD-10-CM | POA: Diagnosis present

## 2021-01-03 DIAGNOSIS — I130 Hypertensive heart and chronic kidney disease with heart failure and stage 1 through stage 4 chronic kidney disease, or unspecified chronic kidney disease: Secondary | ICD-10-CM | POA: Diagnosis present

## 2021-01-03 DIAGNOSIS — E1142 Type 2 diabetes mellitus with diabetic polyneuropathy: Secondary | ICD-10-CM | POA: Diagnosis present

## 2021-01-03 DIAGNOSIS — N1832 Chronic kidney disease, stage 3b: Secondary | ICD-10-CM | POA: Diagnosis present

## 2021-01-03 DIAGNOSIS — I499 Cardiac arrhythmia, unspecified: Secondary | ICD-10-CM | POA: Diagnosis present

## 2021-01-03 DIAGNOSIS — I493 Ventricular premature depolarization: Secondary | ICD-10-CM | POA: Diagnosis present

## 2021-01-03 DIAGNOSIS — Z794 Long term (current) use of insulin: Secondary | ICD-10-CM

## 2021-01-03 DIAGNOSIS — Z7982 Long term (current) use of aspirin: Secondary | ICD-10-CM

## 2021-01-03 DIAGNOSIS — I10 Essential (primary) hypertension: Secondary | ICD-10-CM | POA: Diagnosis present

## 2021-01-03 DIAGNOSIS — Z6834 Body mass index (BMI) 34.0-34.9, adult: Secondary | ICD-10-CM

## 2021-01-03 DIAGNOSIS — I69392 Facial weakness following cerebral infarction: Secondary | ICD-10-CM

## 2021-01-03 DIAGNOSIS — I69354 Hemiplegia and hemiparesis following cerebral infarction affecting left non-dominant side: Secondary | ICD-10-CM

## 2021-01-03 DIAGNOSIS — R509 Fever, unspecified: Secondary | ICD-10-CM | POA: Diagnosis present

## 2021-01-03 DIAGNOSIS — Z823 Family history of stroke: Secondary | ICD-10-CM

## 2021-01-03 DIAGNOSIS — E785 Hyperlipidemia, unspecified: Secondary | ICD-10-CM | POA: Diagnosis present

## 2021-01-03 DIAGNOSIS — I509 Heart failure, unspecified: Secondary | ICD-10-CM | POA: Diagnosis present

## 2021-01-03 DIAGNOSIS — E119 Type 2 diabetes mellitus without complications: Secondary | ICD-10-CM

## 2021-01-03 DIAGNOSIS — Z91048 Other nonmedicinal substance allergy status: Secondary | ICD-10-CM

## 2021-01-03 LAB — URINALYSIS, ROUTINE W REFLEX MICROSCOPIC
Bacteria, UA: NONE SEEN
Bilirubin Urine: NEGATIVE
Glucose, UA: >=500 mg/dL — AB
Hgb urine dipstick: NEGATIVE
Ketones, ur: NEGATIVE mg/dL
Nitrite: NEGATIVE
Protein, ur: 100 mg/dL — AB
Specific Gravity, Urine: 1.015 (ref 1.005–1.030)
pH: 6 (ref 5.0–8.0)

## 2021-01-03 LAB — GLUCOSE, CAPILLARY
Glucose-Capillary: 117 mg/dL — ABNORMAL HIGH (ref 70–99)
Glucose-Capillary: 167 mg/dL — ABNORMAL HIGH (ref 70–99)
Glucose-Capillary: 225 mg/dL — ABNORMAL HIGH (ref 70–99)
Glucose-Capillary: 150 mg/dL — ABNORMAL HIGH (ref 70–99)
Glucose-Capillary: 323 mg/dL — ABNORMAL HIGH (ref 70–99)

## 2021-01-03 LAB — MRSA PCR SCREENING: MRSA by PCR: NEGATIVE

## 2021-01-03 LAB — SARS CORONAVIRUS 2 (TAT 6-24 HRS): SARS Coronavirus 2: POSITIVE — AB

## 2021-01-03 MED ORDER — GLUCERNA SHAKE PO LIQD: 237.0000 mL | Freq: Two times a day (BID) | ORAL | Status: DC

## 2021-01-03 MED ORDER — VANCOMYCIN HCL 2000 MG/400ML IV SOLN
2000.0000 mg | Freq: Once | INTRAVENOUS | Status: DC
  Filled 2021-01-03: qty 400

## 2021-01-03 MED ORDER — POLYETHYLENE GLYCOL 3350 17 G PO PACK: 17.0000 g | PACK | Freq: Two times a day (BID) | ORAL | Status: DC

## 2021-01-03 MED ORDER — PIPERACILLIN SOD-TAZOBACTAM SO 2.25 (2-0.25) G IV SOLR
3.3750 g | Freq: Three times a day (TID) | INTRAVENOUS | Status: DC
  Filled 2021-01-03 (×3): qty 15

## 2021-01-03 MED ORDER — LIDOCAINE VISCOUS HCL 2 % MT SOLN
15.0000 mL | Freq: Four times a day (QID) | OROMUCOSAL | Status: DC | PRN
  Administered 2021-01-03 (×2): 15 mL via OROMUCOSAL
  Filled 2021-01-03 (×3): qty 15

## 2021-01-03 MED ORDER — LIDOCAINE VISCOUS HCL 2 % MT SOLN
15.0000 mL | Freq: Four times a day (QID) | OROMUCOSAL | Status: DC | PRN
  Filled 2021-01-03: qty 15

## 2021-01-03 MED ORDER — DAPAGLIFLOZIN PROPANEDIOL 5 MG PO TABS
10.0000 mg | ORAL_TABLET | Freq: Every day | ORAL | Status: DC
Start: 2021-01-04 — End: 2021-01-04

## 2021-01-03 MED ORDER — ATORVASTATIN CALCIUM 40 MG PO TABS: 80.0000 mg | ORAL_TABLET | Freq: Every evening | ORAL | Status: DC

## 2021-01-03 MED ORDER — ADULT MULTIVITAMIN W/MINERALS CH: 1.0000 | ORAL_TABLET | Freq: Every day | ORAL | Status: DC

## 2021-01-03 MED ORDER — ASPIRIN 81 MG PO CHEW: 81.0000 mg | CHEWABLE_TABLET | Freq: Every day | ORAL | Status: DC

## 2021-01-03 MED ORDER — VANCOMYCIN HCL IN DEXTROSE 1-5 GM/200ML-% IV SOLN: 1000.0000 mg | INTRAVENOUS | Status: DC

## 2021-01-03 MED ORDER — VANCOMYCIN HCL 10 G IV SOLR
1000.0000 mg | Freq: Two times a day (BID) | INTRAVENOUS | Status: DC
  Filled 2021-01-03: qty 1000

## 2021-01-03 MED ORDER — AMLODIPINE BESYLATE 5 MG PO TABS
5.0000 mg | ORAL_TABLET | Freq: Every day | ORAL | Status: DC
  Administered 2021-01-03: 5 mg via ORAL
  Filled 2021-01-03: qty 1

## 2021-01-03 MED ORDER — INSULIN GLARGINE 100 UNIT/ML ~~LOC~~ SOLN
20.0000 [IU] | SUBCUTANEOUS | Status: DC
  Filled 2021-01-03: qty 0.2

## 2021-01-03 MED ORDER — HYDRALAZINE HCL 10 MG PO TABS: 100.0000 mg | ORAL_TABLET | Freq: Three times a day (TID) | ORAL | Status: DC

## 2021-01-03 MED ORDER — SENNOSIDES-DOCUSATE SODIUM 8.6-50 MG PO TABS: 1.0000 | ORAL_TABLET | Freq: Every day | ORAL | Status: DC

## 2021-01-03 MED ORDER — VANCOMYCIN HCL 1500 MG/300ML IV SOLN
1500.0000 mg | Freq: Once | INTRAVENOUS | Status: DC
  Filled 2021-01-03: qty 300

## 2021-01-03 MED ORDER — INSULIN ASPART 100 UNIT/ML ~~LOC~~ SOLN: 0.0000 [IU] | Freq: Three times a day (TID) | SUBCUTANEOUS | Status: DC

## 2021-01-03 MED ORDER — DABIGATRAN ETEXILATE MESYLATE 75 MG PO CAPS
150.0000 mg | ORAL_CAPSULE | Freq: Two times a day (BID) | ORAL | Status: DC
  Filled 2021-01-03: qty 2

## 2021-01-03 MED ORDER — CARVEDILOL 3.125 MG PO TABS
3.1250 mg | ORAL_TABLET | Freq: Two times a day (BID) | ORAL | Status: DC
  Administered 2021-01-04 – 2021-01-05 (×3): 3.125 mg via ORAL
  Filled 2021-01-03 (×3): qty 1

## 2021-01-03 MED ORDER — SODIUM CHLORIDE 0.9% FLUSH: 3.0000 mL | Freq: Two times a day (BID) | INTRAVENOUS | Status: DC

## 2021-01-03 MED ORDER — MEXILETINE HCL 150 MG PO CAPS
300.0000 mg | ORAL_CAPSULE | Freq: Two times a day (BID) | ORAL | Status: DC
  Filled 2021-01-03: qty 2

## 2021-01-03 MED ORDER — AMLODIPINE BESYLATE 2.5 MG PO TABS: 5.0000 mg | ORAL_TABLET | Freq: Every day | ORAL | Status: DC

## 2021-01-03 MED ORDER — PHENOL 1.4 % MT LIQD: 1.0000 | OROMUCOSAL | Status: DC | PRN

## 2021-01-03 MED ORDER — CALCIUM CARBONATE 1250 (500 CA) MG PO TABS: 1.0000 | ORAL_TABLET | Freq: Every day | ORAL | Status: DC

## 2021-01-03 NOTE — Progress Notes (Signed)
Patient in bed with eyes closed alert and oriented x4 with new MEWS level of 5  Red mews vital signs at 1743 hours. Red mews protocol implemented.patient hot and dry to touch c/o feeling cold with 102.72F  Resident c/o sore throat intensifying over the last few days. Covid 19 swab  Performed and is in process Dr. Ranell Patrick notified at 1800 provider response at 1801  Rapid response called and come up to assess patient recommend transfer to acute. Provider called and transfer orders in process. hospitalist  to see patient and possible transfer to acute in progress

## 2021-01-03 NOTE — Progress Notes (Signed)
Pharmacy Antibiotic Note  Adrienne Lane is a 55 y.o. female admitted from rehab back to [facility] for possible sepsis.   Patient noted to have fever of 102.9, increased bp, respiratory rate 26, pulse 100s on check this evening. Orders received to start empiric vancomycin and zosyn. Scr was trending down but still elevated at 1.5 on last check 1/12, rechecking now. CBC yesterday was wnl.   Vancomycin 1000 mg IV Q 24 hrs. Goal AUC 400-550. Expected AUC: 472 SCr used: 1.5  Patient received vancomycin 2g load 1/14 and then follow up 1g dose this morning. With AUC dosing above patient will not need another dose of vancomycin until tomorrow morning. Zosyn ordered on schedule tonight.    Plan: Vancomycin 1g  IV every 24 hours.  Goal trough 15-20 mcg/mL. Zosyn 3.375g IV q8h (4 hour infusion).     Temp (24hrs), Avg:100.9 F (38.3 C), Min:98.6 F (37 C), Max:102.9 F (39.4 C)  Recent Labs  Lab 12/29/20 0603 01/01/21 0507 01/02/21 2308  WBC 4.6  --  4.3  CREATININE 1.77* 1.52*  --     Estimated Creatinine Clearance: 54.2 mL/min (A) (by C-G formula based on SCr of 1.52 mg/dL (H)).    Allergies  Allergen Reactions  . Adhesive [Tape]     Tears skin, can tolerate paper tape   Thank you for allowing pharmacy to be a part of this patient's care.  Erin Hearing PharmD., BCPS Clinical Pharmacist 01/03/2021 7:16 PM

## 2021-01-03 NOTE — Discharge Summary (Addendum)
Physician Discharge Summary  Patient ID: Adrienne Lane MRN: [id number] DOB/AGE: 05/31/1966 55 y.o.  Admit date: 12/27/2020 Discharge date: 01/03/2021  Discharge Diagnoses:  Principal Problem:   Right pontine cerebrovascular accident [facility]) Active Problems:   Hypokalemia   Labile blood glucose   Poorly controlled type 2 diabetes mellitus with peripheral neuropathy (HCC)   Stage 3b chronic kidney disease ([place])   Labile blood pressure   PAF (paroxysmal atrial fibrillation) (HCC)   Irregular cardiac rhythm   Slow transit constipation   Sore throat Hyperlipidemia Obesity Diastolic congestive heart failure Fever of unknown origin  Discharged Condition: Stable  Significant Diagnostic Studies: CT HEAD WO CONTRAST  Result Date: 12/24/2020 CLINICAL DATA:  Neuro deficit, acute stroke suspected. Abnormal gait and aphasia stray. Today's having left-sided weakness. EXAM: CT HEAD WITHOUT CONTRAST TECHNIQUE: Contiguous axial images were obtained from the base of the skull through the vertex without intravenous contrast. COMPARISON:  None. FINDINGS: Brain: Small area of hypoattenuation in the left periatrial white matter (see series 5, image 44 and series 3, image 15). Additional small area of hypoattenuation in the right corona radiata. No acute hemorrhage. No hydrocephalus. No mass lesion. No abnormal mass effect. No midline shift. No extra-axial fluid collection. Vascular: No hyperdense vessel identified. Calcific atherosclerosis. Skull: No acute fracture. Punctate foreign body in the subcutaneous high posterior left scalp without soft tissue swelling. Sinuses/Orbits: No acute finding. Other: No mastoid effusions. IMPRESSION: 1. Small area of hypoattenuation in the left periatrial white matter could represent chronic microvascular ischemic disease or age indeterminate lacunar infarct. Additional age indeterminate lacunar infarct within the right corona radiata, favored remote. If there is concern for  acute infarct, MRI could further evaluate. 2. No acute hemorrhage. 3. Punctate foreign body in the subcutaneous high posterior left scalp, likely remote given no scalp swelling. Electronically Signed   By: Margaretha Sheffield MD   On: 12/24/2020 12:30   MR ANGIO HEAD WO CONTRAST  Result Date: 12/24/2020 CLINICAL DATA:  Follow-up examination for acute stroke. EXAM: MRA HEAD WITHOUT CONTRAST TECHNIQUE: Angiographic images of the Circle of Willis were obtained using MRA technique without intravenous contrast. COMPARISON:  Prior MRI from earlier the same day. FINDINGS: ANTERIOR CIRCULATION: Visualized distal cervical segments of the internal carotid arteries are widely patent with symmetric antegrade flow. Petrous, cavernous, and supraclinoid segments widely patent without stenosis or other abnormality. A1 segments widely patent. Normal anterior communicating artery complex. Anterior cerebral arteries widely patent to their distal aspects. No M1 stenosis or occlusion. Normal MCA bifurcations. Distal MCA branches well perfused and symmetric. POSTERIOR CIRCULATION: Both V4 segments widely patent to the vertebrobasilar junction without stenosis. Left vertebral artery dominant. Both PICA origins patent and normal. Basilar widely patent to its distal aspect. Superior cerebellar arteries patent bilaterally. Both PCAs supplied via the basilar as well as small bilateral posterior communicating arteries. PCAs well perfused to their distal aspects without stenosis. No intracranial aneurysm or other vascular abnormality. IMPRESSION: Normal intracranial MRA. No large vessel occlusion, hemodynamically significant stenosis, or other acute vascular abnormality. Electronically Signed   By: Jeannine Boga M.D.   On: 12/24/2020 22:40   MR BRAIN WO CONTRAST  Result Date: 12/24/2020 CLINICAL DATA:  Neuro deficit, acute stroke suspected. EXAM: MRI HEAD WITHOUT CONTRAST TECHNIQUE: Multiplanar, multiecho pulse sequences of the brain  and surrounding structures were obtained without intravenous contrast. COMPARISON:  Same day head CT. FINDINGS: Brain: Acute infarct within the right hemi pons. Mild associated edema without substantial mass effect. Small acute infarct in the  left frontal subcortical white matter (see series 2 and 250, image 29) without associated edema. Small remote lacunar infarcts in the right corona radiata and left periatrial white matter. No hydrocephalus. No acute hemorrhage. No mass lesion. No extra-axial fluid collection. Vascular: Major arterial flow voids are maintained at the skull base. Skull and upper cervical spine: Normal marrow signal. Sinuses/Orbits: Sinuses are clear.  Unremarkable orbits. IMPRESSION: 1. Acute infarct in the right hemipons. Additional small acute infarct in the left frontal subcortical white matter. 2. Mild associated edema in the right pons without substantial mass effect. 3. Small remote lacunar infarcts in the right corona radiata and left periatrial white matter. Findings discussed with Dr. Shelly Coss via telephone at 2:02 PM. Electronically Signed   By: Margaretha Sheffield MD   On: 12/24/2020 14:05   DG CHEST PORT 1 VIEW  Result Date: 01/02/2021 CLINICAL DATA:  Fever EXAM: PORTABLE CHEST 1 VIEW COMPARISON:  02/22/2020 FINDINGS: Low lung volumes. No focal airspace disease or pleural effusion. No pneumothorax. Minimal atelectasis right base IMPRESSION: Low lung volumes with minimal atelectasis right base. Electronically Signed   By: Donavan Foil M.D.   On: 01/02/2021 20:23   ECHOCARDIOGRAM COMPLETE  Result Date: 12/25/2020    ECHOCARDIOGRAM REPORT   Patient Name:   Adrienne Lane Date of Exam: 12/25/2020 Medical Rec #:  [id number]      Height:       68.0 in Accession #:    [id number]     Weight:       224.0 lb Date of Birth:  08/09/1966      BSA:          2.145 m Patient Age:    55 years       BP:           137/71 mmHg Patient Gender: F              HR:           81 bpm. Exam Location:   Inpatient Procedure: 2D Echo, Color Doppler and Cardiac Doppler Indications:    Stroke i163.96  History:        Patient has prior history of Echocardiogram examinations, most                 recent 06/16/2020. CHF, Arrythmias:Atrial Fibrillation; Risk                 Factors:Hypertension, Diabetes and Dyslipidemia.  Sonographer:    Raquel Sarna Senior RDCS Referring Phys: [place]  1. Since the last study on 06/16/20 LVEF remains mildly decreased with diffuse hypokinesis and LVEF 45-50%. RVEF is mildly decreased, RV is now less dilated and RVSP has improved from 50 to 30 mmHg.  2. Left ventricular ejection fraction, by estimation, is 45 to 50%. The left ventricle has mildly decreased function. The left ventricle demonstrates global hypokinesis. Left ventricular diastolic function could not be evaluated.  3. Right ventricular systolic function is mildly reduced. The right ventricular size is normal. There is normal pulmonary artery systolic pressure. The estimated right ventricular systolic pressure is 39.7 mmHg.  4. Left atrial size was mildly dilated.  5. The mitral valve is normal in structure. Mild mitral valve regurgitation. No evidence of mitral stenosis.  6. The aortic valve is normal in structure. Aortic valve regurgitation is not visualized. No aortic stenosis is present.  7. The inferior vena cava is normal in size with <50% respiratory variability, suggesting right atrial pressure of 8 mmHg. FINDINGS  Left Ventricle: Left ventricular ejection fraction, by estimation, is 45 to 50%. The left ventricle has mildly decreased function. The left ventricle demonstrates global hypokinesis. The left ventricular internal cavity size was normal in size. There is  no left ventricular hypertrophy. Left ventricular diastolic function could not be evaluated due to atrial fibrillation. Left ventricular diastolic function could not be evaluated. Right Ventricle: The right ventricular size is normal. No  increase in right ventricular wall thickness. Right ventricular systolic function is mildly reduced. There is normal pulmonary artery systolic pressure. The tricuspid regurgitant velocity is 2.35 m/s, and with an assumed right atrial pressure of 8 mmHg, the estimated right ventricular systolic pressure is 53.6 mmHg. Left Atrium: Left atrial size was mildly dilated. Right Atrium: Right atrial size was normal in size. Pericardium: There is no evidence of pericardial effusion. Mitral Valve: The mitral valve is normal in structure. Mild mitral valve regurgitation. No evidence of mitral valve stenosis. Tricuspid Valve: The tricuspid valve is normal in structure. Tricuspid valve regurgitation is mild . No evidence of tricuspid stenosis. Aortic Valve: The aortic valve is normal in structure. Aortic valve regurgitation is not visualized. No aortic stenosis is present. Pulmonic Valve: The pulmonic valve was normal in structure. Pulmonic valve regurgitation is not visualized. No evidence of pulmonic stenosis. Aorta: The aortic root is normal in size and structure. Venous: The inferior vena cava is normal in size with less than 50% respiratory variability, suggesting right atrial pressure of 8 mmHg. IAS/Shunts: No atrial level shunt detected by color flow Doppler.  LEFT VENTRICLE PLAX 2D LVIDd:         4.80 cm LVIDs:         4.20 cm LV PW:         1.20 cm LV IVS:        0.70 cm LVOT diam:     2.30 cm LV SV:         54 LV SV Index:   25 LVOT Area:     4.15 cm  LV Volumes (MOD) LV vol d, MOD A2C: 84.7 ml LV vol d, MOD A4C: 99.4 ml LV vol s, MOD A2C: 60.2 ml LV vol s, MOD A4C: 65.7 ml LV SV MOD A2C:     24.5 ml LV SV MOD A4C:     99.4 ml LV SV MOD BP:      31.4 ml RIGHT VENTRICLE RV S prime:     7.51 cm/s TAPSE (M-mode): 2.3 cm LEFT ATRIUM             Index       RIGHT ATRIUM           Index LA diam:        3.60 cm 1.68 cm/m  RA Area:     15.70 cm LA Vol (A2C):   83.1 ml 38.75 ml/m RA Volume:   41.60 ml  19.40 ml/m LA Vol  (A4C):   62.1 ml 28.96 ml/m LA Biplane Vol: 71.2 ml 33.20 ml/m  AORTIC VALVE LVOT Vmax:   66.25 cm/s LVOT Vmean:  43.275 cm/s LVOT VTI:    0.129 m  AORTA Ao Root diam: 2.80 cm TRICUSPID VALVE TR Peak grad:   22.1 mmHg TR Vmax:        235.00 cm/s  SHUNTS Systemic VTI:  0.13 m Systemic Diam: 2.30 cm Ena Dawley MD Electronically signed by Ena Dawley MD Signature Date/Time: 12/25/2020/11:53:23 AM    Final    VAS US CAROTID  Result  Date: 12/25/2020 Carotid Arterial Duplex Study Indications:   CVA and Left side weakness, left facial drrop, speech                disturbance. Risk Factors:  Hypertension, Diabetes, no history of smoking. Other Factors: CKD4, CHF, AFIB, Obesity. Performing Technologist: Rogelia Rohrer  Examination Guidelines: A complete evaluation includes B-mode imaging, spectral Doppler, color Doppler, and power Doppler as needed of all accessible portions of each vessel. Bilateral testing is considered an integral part of a complete examination. Limited examinations for reoccurring indications may be performed as noted.  Right Carotid Findings: +----------+--------+--------+--------+------------------+--------+           PSV cm/sEDV cm/sStenosisPlaque DescriptionComments +----------+--------+--------+--------+------------------+--------+ CCA Prox  97      11                                         +----------+--------+--------+--------+------------------+--------+ CCA Distal78      28                                         +----------+--------+--------+--------+------------------+--------+ ICA Prox  65      16      1-39%                              +----------+--------+--------+--------+------------------+--------+ ICA Distal109     25                                         +----------+--------+--------+--------+------------------+--------+ ECA       89      0                                           +----------+--------+--------+--------+------------------+--------+ +----------+--------+-------+----------------+-------------------+           PSV cm/sEDV cmsDescribe        Arm Pressure (mmHG) +----------+--------+-------+----------------+-------------------+ [id number]     0      Multiphasic, WNL                    +----------+--------+-------+----------------+-------------------+ +---------+--------+--+--------+--+---------+ VertebralPSV cm/s61EDV cm/s13Antegrade +---------+--------+--+--------+--+---------+  Left Carotid Findings: +----------+--------+--------+--------+------------------+--------+           PSV cm/sEDV cm/sStenosisPlaque DescriptionComments +----------+--------+--------+--------+------------------+--------+ CCA Prox  107     15                                         +----------+--------+--------+--------+------------------+--------+ CCA Distal84      18                                         +----------+--------+--------+--------+------------------+--------+ ICA Prox  89      25      1-39%                              +----------+--------+--------+--------+------------------+--------+ ICA Distal93  28                                         +----------+--------+--------+--------+------------------+--------+ ECA       64      0                                          +----------+--------+--------+--------+------------------+--------+ +----------+--------+--------+----------------+-------------------+           PSV cm/sEDV cm/sDescribe        Arm Pressure (mmHG) +----------+--------+--------+----------------+-------------------+ [id number]      11      Multiphasic, WNL                    +----------+--------+--------+----------------+-------------------+ +---------+--------+--+--------+--+---------+ VertebralPSV cm/s63EDV cm/s19Antegrade +---------+--------+--+--------+--+---------+   Summary: Right  Carotid: Velocities in the right ICA are consistent with a 1-39% stenosis.                The extracranial vessels were near-normal with only minimal wall                thickening or plaque. Left Carotid: Velocities in the left ICA are consistent with a 1-39% stenosis.               The extracranial vessels were near-normal with only minimal wall               thickening or plaque. Vertebrals:  Bilateral vertebral arteries demonstrate antegrade flow. Subclavians: Normal flow hemodynamics were seen in bilateral subclavian              arteries. *See table(s) above for measurements and observations.     Preliminary     Labs:  Basic Metabolic Panel: Recent Labs  Lab 12/29/20 0603 01/01/21 0507  NA 139 142  K 3.4* 4.0  CL 106 109  CO2 24 21*  GLUCOSE 125* 98  BUN 31* 33*  CREATININE 1.77* 1.52*  CALCIUM 9.3 9.2    CBC: Recent Labs  Lab 12/29/20 0603 01/02/21 2308  WBC 4.6 4.3  NEUTROABS 2.6 3.2  HGB 12.0 12.3  HCT 36.4 36.9  MCV 86.9 86.6  PLT 175 174    CBG: Recent Labs  Lab 01/02/21 2059 01/03/21 0609 01/03/21 0816 01/03/21 1150 01/03/21 1604  GLUCAP 124* 117* 167* 225* 150*   Family history.  Mother with atrial fibrillation hypertension CVA and diabetes mellitus.  Father with diabetes and hypertension.  Maternal grandmother with CVA.  Denies any colon cancer esophageal cancer or rectal cancer  Brief HPI:   Adrienne Lane is a 55 y.o. right-handed female with history of atrial fibrillation RVR maintained on Eliquis diastolic congestive heart failure CAD CKD stage III diabetes mellitus obesity hypertension and cardiomyopathy..  Per chart review lives with spouse and also mother-in-law and son.  Mobile home 5 steps to entry.  Reportedly independent prior to admission.  Presented 12/24/2020 with left-sided weakness facial droop and slurred speech.  CT/MRI showed acute infarct right hemipons as well as additional small acute infarct left frontal subcortical white matter.  Small  remote lacunar infarct in the right corona radiata and left periatrial white matter.  Patient did not receive tPA.  MRA of the head negative.  Echocardiogram with ejection fraction of 45 to 50% without thrombus.  Admission chemistries unremarkable except potassium 3.3 glucose  305 BUN 32 creatinine 2.12 urine drug screen negative.  Neurology follow-up Eliquis was changed to Pradaxa for CVA prophylaxis as well as the addition of low-dose aspirin.  Due to patient's left-sided weakness facial droop slurred speech and decreased functional ability she was admitted for a comprehensive rehab program.   Hospital Course: Adrienne Lane was admitted to rehab 12/27/2020 for inpatient therapies to consist of PT, ST and OT at least three hours five days a week. Past admission physiatrist, therapy team and rehab RN have worked together to provide customized collaborative inpatient rehab.  Pertaining to patient's right pontine lacunar infarction remained stable currently maintained on Pradaxa as well as low-dose aspirin.  Noted history of atrial fibrillation remained on Pradaxa as well as Mexitil.  Latest EKG showed PVCs as well as PACs.  Blood pressure controlled on Coreg 3.125 mg twice daily as well as currently on hydralazine 100 mg 3 times daily.  Diastolic congestive heart failure monitoring for any signs of fluid overload.  CKD stage III baseline creatinine 1.55-2.38 Demadex prior to admission as directed.  Lipitor ongoing for hyperlipidemia.  Noted obesity BMI 34.06.  Diabetes mellitus hemoglobin A1c of 8 insulin therapy as directed.  On 01/03/2021 patient developed cough sore throat some mucus production low-grade fever rising placed on empiric antibiotics blood cultures and urine cultures were pending chest x-ray showed no pneumonia.  COVID testing pending at time of discharge.  Due to these advances and fever she was discharged to acute care services for ongoing observation.   Blood pressures were monitored on TID  basis and monitored  Diabetes has been monitored with ac/hs CBG checks and SSI was use prn for tighter BS control.    Rehab course: During patient's stay in rehab weekly team conferences were held to monitor patient's progress, set goals and discuss barriers to discharge. At admission, patient required total assist stand pivot transfers.  Minimal assist upper body bathing minimal assist lower body bathing minimal assist upper body dressing minimal assist lower body dressing  Physical exam.  Temperature 100.9 pulse 110 respirations 18 oxygen saturation 97% room air Constitutional.  No acute distress noted cough HEENT Head.  Normocephalic and atraumatic Neck.  Supple nontender no JVD without thyromegaly Cardiac regular rate rhythm without extra sounds or murmur heard Abdomen.  Soft nontender positive bowel sounds without rebound Respiratory effort normal no respiratory distress without wheeze Musculoskeletal.  No swelling or deformity Neurologic.  Alert no acute distress speech is a bit dysarthric but intelligible oriented x3.  Left central 7.  Left upper extremity 3/5 proximal to distal left lower extremity 3+ to 4/5.  Distal sensory loss in her feet  He/She  has had improvement in activity tolerance, balance, postural control as well as ability to compensate for deficits. He/She has had improvement in functional use RUE/LUE  and RLE/LLE as well as improvement in awareness.  Patient directed sit to stand from recliner with single-point cane contact-guard assist ambulates 150 feet contact-guard assist.  Required some rest breaks due to fatigue.  Patient directed in lateral stepping minimal assist.  Patient able to stack amount stacked checker pieces with left hand with increased time dropping 10% of checkers.  Ambulated to the toilet straight point cane contact-guard assist.  Completed clothing management and hygiene with supervision.       Disposition: Discharged to acute care  services   Diet: Currently n.p.o.  Special Instructions: Medication changes made at the discretion of [facility] hospitalists  30-35 minutes were spent completing discharge summary  and discharge planning  Discharge Instructions    Ambulatory referral to Neurology   Complete by: As directed    An appointment is requested in approximately 4 weeks right pontine lacunar infarction   No wound care   Complete by: As directed        Follow-up Information    Jamse Arn, MD Follow up.   Specialty: Physical Medicine and Rehabilitation Why: Office to call for appointment Contact information: [address] [place] [address] [phone]        Donato Heinz, MD Follow up.   Specialties: Cardiology, Radiology Why: Call for appointment Contact information: [place] [address] [phone]        Nicolette Bang, DO Follow up.   Specialty: Family Medicine Contact information: [place] [address] [phone]               Signed: Cathlyn Parsons 01/03/2021, 7:14 PM

## 2021-01-03 NOTE — H&P (Addendum)
History and Physical   Rajni Holsworth TOI:[id number] DOB: 1966/07/12 DOA: (Not on file)  PCP: Nicolette Bang, DO   Patient coming from: [facility] inpatient rehab  Chief Complaint: Fever, tachycardia, hypertension, constipation  HPI: Adrienne Lane is a 55 y.o. female with medical history significant of CVA, diabetes, CKD 3, A. fib, constipation, CHF, hypertension, normocytic anemia, hyperlipidemia who is being evaluated for acute febrile illness for the past 2 days at [facility] inpatient rehab.  Patient states she has had some constipation sore throat for 2 days and has had recurrent fevers for the past 1 to 2 days as well.  She reports new cough as well.  She denies chest pain, shortness of breath, abdominal pain, diarrhea, nausea.  As below she has received antibiotics without improvement of her fever.  Has responded to Tylenol but is recurrent.  She has had intermittent tachypnea and tachycardia as below along with her fever.  [facility] and Rehab Course: Hospital: Patient admitted from 1/5-1/8 with acute CVA.  Deficits with dysarthria, sensory loss in her feet, lower extremity weakness.  Patient also hyper tensive during admission that this was partially due to permissive hypertension.  She also had some refractory hypokalemia during her admission as well as AKI on CKD 3 which improved.  Noted to have CHF as well.  Patient was discharged to inpatient rehab due to her residual deficits.  Rehab: Admitted to inpatient rehab on 1/8.  Patient began experiencing sore throat and fever for the past 2 days.  Vital signs today included fever to 102, tachycardia to 108, tachypnea 26 saturating 100% on room air and hypertensive.  Recent lab work included BMP on 1/13 which showed creatinine of 1.5 which was stable.  CBC yesterday showed no abnormalities.  Urine culture and blood cultures also obtained as well as respiratory panel for flu and COVID today.  Blood cultures thus far no growth to date.  Chest  x-ray was obtained which showed low lung volumes and atelectasis but no clear infiltrate.  Due to elevated MEWS and vitals rapid response was called and patient was recommended for transfer to acute hospital for closer monitoring.  Review of Systems: As per HPI otherwise all other systems reviewed and are negative.  Past Medical History:  Diagnosis Date  . Atrial fibrillation with RVR ([place])   . [place] 01/2020  . CHF (congestive heart failure) ([place]) 10/20/2019  . CKD (chronic kidney disease), stage IV ([place])   . Diabetes mellitus without complication ([place])   . DOE (dyspnea on exertion)    walking upstairs or up hill resolves in one minute  . Fibroids   . History of kidney stones   . History of recent blood transfusion 02/26/2020  . Hypertension   . Iron deficiency anemia   . Non-ischemic cardiomyopathy (HCC)    tachycardia induced  . Obese   . Peripheral edema   . Premature ventricular contractions (PVCs) ([facility])   . Umbilical hernia   . Wears glasses     Past Surgical History:  Procedure Laterality Date  . CHOLECYSTECTOMY    . CYSTOSCOPY W/ URETERAL STENT PLACEMENT Bilateral 05/24/2020   Procedure: CYSTOSCOPY WITH RETROGRADE PYELOGRAM/URETERAL STENT PLACEMENT;  Surgeon: Robley Fries, MD;  Location: [facility];  Service: Urology;  Laterality: Bilateral;  . CYSTOSCOPY/RETROGRADE/URETEROSCOPY Bilateral 04/03/2020   Procedure: CYSTOSCOPY/RETROGRADE/URETEROSCOPY;  Surgeon: Ardis Hughs, MD;  Location: [facility];  Service: Urology;  Laterality: Bilateral;  . HYSTERECTOMY ABDOMINAL WITH SALPINGO-OOPHORECTOMY  07/21/2020   Procedure: HYSTERECTOMY ABDOMINAL WITH BILATERAL  SALPINGO-OOPHORECTOMY;  Surgeon: Sanjuana Kava, MD;  Location: [facility];  Service: Gynecology;;  . IR FLUORO GUIDE CV LINE RIGHT  02/21/2020  . IR US GUIDE VASC ACCESS RIGHT  02/21/2020  . NEPHROLITHOTOMY Left 02/14/2020   Procedure: NEPHROLITHOTOMY PERCUTANEOUS/ SURGEON ACCESS/ LEFT PERCUTANEOUS NEPHROSTOMY TUBE PLACEMENT;   Surgeon: Ardis Hughs, MD;  Location: [facility];  Service: Urology;  Laterality: Left;  . NEPHROLITHOTOMY Left 02/21/2020   Procedure: NEPHROLITHOTOMY PERCUTANEOUS SECOND LOOK;  Surgeon: Ardis Hughs, MD;  Location: [facility];  Service: Urology;  Laterality: Left;  . NEPHROLITHOTOMY Left 02/26/2020   Procedure: NEPHROLITHOTOMY PERCUTANEOUS;  Surgeon: Ceasar Mons, MD;  Location: [facility];  Service: Urology;  Laterality: Left;  NEED 150 MIN  . NEPHROLITHOTOMY Right 03/24/2020   Procedure: NEPHROLITHOTOMY PERCUTANEOUS WITH ACCESS LEFT STENT REMOVAL;  Surgeon: Ardis Hughs, MD;  Location: [facility];  Service: Urology;  Laterality: Right;  . RIGHT HEART CATH N/A 11/09/2019   Procedure: RIGHT HEART CATH;  Surgeon: Jolaine Artist, MD;  Location: [facility];  Service: Cardiovascular;  Laterality: N/A;  . WISDOM TOOTH EXTRACTION      Social History  reports that she has never smoked. She has never used smokeless tobacco. She reports that she does not drink alcohol and does not use drugs.  Allergies  Allergen Reactions  . Adhesive [Tape]     Tears skin, can tolerate paper tape    Family History  Problem Relation Age of Onset  . Atrial fibrillation Mother   . Hypertension Mother   . Stroke Mother 30  . Diabetes Mother   . Diabetes Father   . Hypertension Father   . Diabetes Sister   . Hypertension Sister   . Diabetes Brother   . Hypertension Brother   . Diabetes Brother   . Heart attack Brother   . Stroke Maternal Grandmother 45  Reviewed on admission  Prior to Admission medications   Medication Sig Start Date End Date Taking? Authorizing Provider  amLODipine (NORVASC) 5 MG tablet Take 1 tablet (5 mg total) by mouth daily. 07/10/20   Charlott Rakes, MD  aspirin 81 MG chewable tablet Chew 1 tablet (81 mg total) by mouth daily. 12/27/20 12/22/21  Kayleen Memos, DO  atorvastatin (LIPITOR) 80 MG tablet Take 1 tablet (80 mg total) by mouth every evening. Patient  not taking: No sig reported 12/27/20 03/27/21  Kayleen Memos, DO  calcium-vitamin D (OSCAL 500/200 D-3) 500-200 MG-UNIT tablet Take 1 tablet by mouth daily with breakfast. Patient not taking: No sig reported 12/27/20 03/27/21  Kayleen Memos, DO  carvedilol (COREG) 3.125 MG tablet Take 1 tablet (3.125 mg total) by mouth 2 (two) times daily with a meal. 12/27/20 01/26/21  Kayleen Memos, DO  dabigatran (PRADAXA) 150 MG CAPS capsule Take 1 capsule (150 mg total) by mouth every 12 (twelve) hours. 12/27/20 03/27/21  Kayleen Memos, DO  dapagliflozin propanediol (FARXIGA) 10 MG TABS tablet Take 1 tablet (10 mg total) by mouth daily before breakfast. 06/02/20   Bensimhon, Shaune Pascal, MD  glucose blood test strip Use as instructed 11/19/19   Clegg, Amy D, NP  hydrALAZINE (APRESOLINE) 100 MG tablet Take 0.5 tablets (50 mg total) by mouth 3 (three) times daily. 12/27/20 01/26/21  Kayleen Memos, DO  insulin glargine (LANTUS) 100 UNIT/ML injection Inject 0.2 mLs (20 Units total) into the skin every morning. 06/10/20   Charlott Rakes, MD  isosorbide mononitrate (IMDUR) 60 MG 24 hr tablet Take  60 mg by mouth daily.    [provider]  Lancets (ONETOUCH ULTRASOFT) lancets Check CBG twice a day 11/12/19   Clegg, Amy D, NP  mexiletine (MEXITIL) 150 MG capsule TAKE 2 CAPSULES (300 MG TOTAL) BY MOUTH EVERY 12 (TWELVE) HOURS. 11/24/20   Clegg, Amy D, NP  Multiple Vitamins-Minerals (WOMENS MULTI PO) Take 1 tablet by mouth daily.    [provider]  potassium chloride (KLOR-CON) 10 MEQ tablet Take 10 mEq by mouth daily.    [provider]  TRUEPLUS INSULIN SYRINGE 30G X 5/16" 0.5 ML MISC USE TO INJECT 12 UNITS AT BEDTIME. 11/24/20   Clegg, Amy D, NP   Vitals at 8p: T 98.6, BP 154/75, HR 90, RR 18, 97% RA  Physical Exam: Vitals with BMI 01/03/2021 01/03/2021 01/03/2021  Height - - -  Weight - - -  BMI - - -  Systolic 782 956 213  Diastolic 75 76 84  Pulse 90 92 108   Physical Exam Constitutional:       General: She is not in acute distress.    Appearance: Normal appearance.     Comments: Somewhat tired appearing but alert and oriented.  HENT:     Head: Normocephalic and atraumatic.     Mouth/Throat:     Mouth: Mucous membranes are moist.     Pharynx: Oropharynx is clear.  Eyes:     Extraocular Movements: Extraocular movements intact.     Pupils: Pupils are equal, round, and reactive to light.  Cardiovascular:     Rate and Rhythm: Normal rate. Rhythm irregular.     Pulses: Normal pulses.     Heart sounds: Normal heart sounds.  Pulmonary:     Effort: Pulmonary effort is normal. No respiratory distress.     Comments: Trace rhonchi left anterior Abdominal:     General: Bowel sounds are normal. There is no distension.     Palpations: Abdomen is soft.     Tenderness: There is no abdominal tenderness.  Musculoskeletal:        General: No swelling or deformity.  Skin:    General: Skin is warm and dry.  Neurological:     Mental Status: Mental status is at baseline.     Comments: Residual deficits of left-sided weakness and mild dysarthria    Labs on Admission: I have personally reviewed following labs and imaging studies  CBC: Recent Labs  Lab 12/29/20 0603 01/02/21 2308  WBC 4.6 4.3  NEUTROABS 2.6 3.2  HGB 12.0 12.3  HCT 36.4 36.9  MCV 86.9 86.6  PLT 175 086    Basic Metabolic Panel: Recent Labs  Lab 12/29/20 0603 01/01/21 0507  NA 139 142  K 3.4* 4.0  CL 106 109  CO2 24 21*  GLUCOSE 125* 98  BUN 31* 33*  CREATININE 1.77* 1.52*  CALCIUM 9.3 9.2   GFR: Estimated Creatinine Clearance: 54.2 mL/min (A) (by C-G formula based on SCr of 1.52 mg/dL (H)).  Liver Function Tests: Recent Labs  Lab 12/29/20 0603  AST 20  ALT 17  ALKPHOS 75  BILITOT 0.8  PROT 6.6  ALBUMIN 2.9*   Urine analysis:    Component Value Date/Time   COLORURINE STRAW (A) 05/23/2020 1400   APPEARANCEUR CLEAR 05/23/2020 1400   LABSPEC 1.006 05/23/2020 1400   PHURINE 7.0 05/23/2020  1400   GLUCOSEU NEGATIVE 05/23/2020 1400   HGBUR NEGATIVE 05/23/2020 1400   BILIRUBINUR NEGATIVE 05/23/2020 1400   KETONESUR NEGATIVE 05/23/2020 1400   PROTEINUR  100 (A) 05/23/2020 1400   NITRITE NEGATIVE 05/23/2020 1400   LEUKOCYTESUR TRACE (A) 05/23/2020 1400    Radiological Exams on Admission: DG CHEST PORT 1 VIEW  Result Date: 01/02/2021 CLINICAL DATA:  Fever EXAM: PORTABLE CHEST 1 VIEW COMPARISON:  02/22/2020 FINDINGS: Low lung volumes. No focal airspace disease or pleural effusion. No pneumothorax. Minimal atelectasis right base IMPRESSION: Low lung volumes with minimal atelectasis right base. Electronically Signed   By: Donavan Foil M.D.   On: 01/02/2021 20:23   EKG: Last EKG on 1/12 showed sinus rhythm with frequent PVCs and PAC.  Nonspecific T wave abnormalities were also noted.  Similar to previous.  Assessment/Plan Principal Problem:   Febrile illness, acute Active Problems:   CHF (congestive heart failure) (HCC)   Type 2 diabetes mellitus (HCC)   Essential hypertension   Frequent PVCs   Hyperlipidemia   Acute CVA (cerebrovascular accident) ([place])   Poorly controlled type 2 diabetes mellitus with peripheral neuropathy (HCC)   Stage 3b chronic kidney disease (HCC)   PAF (paroxysmal atrial fibrillation) (HCC)   Irregular cardiac rhythm  Acute Febrile Illness > 2 days of fever to 102 despite antibiotics and Rapid Response had to be called due to elevated MEWS and patient recommended to transfer to acute hospital for closer monitoring. > Chest x-ray with atelectasis but no infiltrate, blood cultures negative to date, last CBC without leukocytosis. > Primary complaint is sore throat, cough an fever. > No exudate noted on throat exam, some suspicion for COVID infection given fevers, lack of leukocytosis, cough and ongoing surge in the setting of pandemic. - Follow-up urine culture and blood cultures - Follow-up COVID panel - Add on urinalysis to hopefully get data  sooner - We will await results of COVID swab before looking for other viral etiology - Repeat CBC to ensure no developing leukocytosis, plan to discontinue antibiotics - Repeat BMP and add LFTs  CVA Hyperlipidemia > Suspected to be due to small vessel disease as she was compliant with her anticoagulation for A. Fib. - Continue PT/OT - Continue Atorvastatin, ASA, Pradaxa  Hypertension > BP elevated to 496P-591M systolic - Continue amlodipine 5 mg, carvedilol 12.5 mg twice daily, hydralazine 100 mg 3 times daily  Diabetes - Continue Farxiga - Lantus 20 units daily - Sliding scale insulin  CKD 3 > Creatinine on 113 stable at 1.5 - Trend renal function and electrolytes - Avoid nephrotoxic agents  Atrial fibrillation > History of frequent PVCs, On Mexiletine - Heart rate elevated earlier in the setting of febrile illness - Continue home Mexiletine - Continue home Coreg and Pradaxa  CHF > Echo during recent admission showed EF 45 to 50% with global hypokinesis -Continue Coreg -Continue I/Os, daily weights  Constipation > Being treated for constipation at rehab, continues to report some constipation however abdomen remains soft and nontender - Continue MiraLAX and Senokot-S  DVT prophylaxis: Pradaxa  Code Status:   Full  Family Communication:  None at the time of transfer  Disposition Plan:   Patient is from:  Current inpatient rehab  Anticipated DC to:  [facility]  Anticipated DC date:  1 to 3 days  Anticipated DC barriers: None  Consults called:  None  Admission status:  Observation, telemetry   Severity of Illness: The appropriate patient status for this patient is OBSERVATION. Observation status is judged to be reasonable and necessary in order to provide the required intensity of service to ensure the patient's safety. The patient's presenting symptoms, physical exam  findings, and initial radiographic and laboratory data in the context of their medical  condition is felt to place them at decreased risk for further clinical deterioration. Furthermore, it is anticipated that the patient will be medically stable for discharge from the hospital within 2 midnights of admission. The following factors support the patient status of observation.   " The patient's presenting symptoms include tachycardia, fever, sore throat, constipation. " The physical exam findings include sore throat, cough, heart rate improved, does still feel warm to the touch, mild rhonchi. " The initial radiographic and laboratory data are recent lab work showed normal CBC, stable BMP, blood cultures negative to date, urine culture pending, chest x-ray with low lung volumes and atelectasis but no sign of pneumonia, respiratory panel for flu and COVID pending.   Marcelyn Bruins MD [facility]  How to contact the [facility] Attending or Consulting provider [place] or covering provider during after hours [place], for this patient?   1. Check the care team in [facility] and look for a) attending/consulting TRH provider listed and b) the [facility] team listed 2. Log into [email or web address] and use [place]'s universal password to access. If you do not have the password, please contact the hospital operator. 3. Locate the [facility] provider you are looking for under [facility] and page to a number that you can be directly reached. 4. If you still have difficulty reaching the provider, please page the [facility] (Director on Call) for the Hospitalists listed on amion for assistance.  01/03/2021, 8:01 PM

## 2021-01-03 NOTE — Progress Notes (Signed)
Overnight event  > Patient COVID test returned positive, this is likely the source of her fevers. - Discontinue antibiotics - Continue to monitor for respiratory symptoms

## 2021-01-03 NOTE — Progress Notes (Signed)
   01/03/21 1743  Assess: MEWS Score  Temp (!) 102.9 F (39.4 C)  BP (!) 155/84  Pulse Rate (!) 108  Resp (!) 26  SpO2 100 %  O2 Device Room Air  Assess: MEWS Score  MEWS Temp 2  MEWS Systolic 0  MEWS Pulse 1  MEWS RR 2  MEWS LOC 0  MEWS Score 5  MEWS Score Color Red  Assess: if the MEWS score is Yellow or Red  Were vital signs taken at a resting state? Yes  Focused Assessment No change from prior assessment  Early Detection of Sepsis Score *See Row Information* High  MEWS guidelines implemented *See Row Information* Yes  Treat  MEWS Interventions Administered prn meds/treatments;Administered scheduled meds/treatments;Escalated (See documentation below)  Pain Scale 0-10  Pain Score 0  Pain Type Acute pain  Take Vital Signs  Increase Vital Sign Frequency  Red: Q 1hr X 4 then Q 4hr X 4, if remains red, continue Q 4hrs  Escalate  MEWS: Escalate Red: discuss with charge nurse/RN and provider, consider discussing with RRT  Notify: Charge Nurse/RN  Name of Charge Nurse/RN Notified Dauda RN  Date Charge Nurse/RN Notified 01/03/21  Time Charge Nurse/RN Notified 1730  Notify: Provider  Provider Name/Title Dr Adam Phenix  Date Provider Notified 01/03/21  Time Provider Notified 1800  Notification Type Call  Notification Reason Change in status  Response See new orders  Date of Provider Response 01/03/21  Time of Provider Response 1815  Notify: Rapid Response  Name of Rapid Response RN Notified patrick RN  Date Rapid Response Notified 01/03/21  Time Rapid Response Notified 9292  Document  Patient Outcome Transferred/level of care increased  Progress note created (see row info) Yes

## 2021-01-03 NOTE — Progress Notes (Signed)
Pt VS are stable. Pt remains alert and oriented. Pt being transferred to room 6N16. Report called to Tinnie Gens. RN.

## 2021-01-03 NOTE — Progress Notes (Addendum)
[place] PHYSICAL MEDICINE & REHABILITATION PROGRESS NOTE   Subjective/Complaints: Continues to complain of sore throat. She has not received pheno spray. Spiked fever yesterday and blood cultures and urine culture ordered and are pending. WBC normal. CXR reviewed with patient shows atelectasis. Incentive spirometer ordered.  ROS: + Sore throat, constipation.  Denies CP, SOB, N/V/D, fevers, chills, night sweats, changes in smell taste or vision.  Objective:   DG CHEST PORT 1 VIEW  Result Date: 01/02/2021 CLINICAL DATA:  Fever EXAM: PORTABLE CHEST 1 VIEW COMPARISON:  02/22/2020 FINDINGS: Low lung volumes. No focal airspace disease or pleural effusion. No pneumothorax. Minimal atelectasis right base IMPRESSION: Low lung volumes with minimal atelectasis right base. Electronically Signed   By: Donavan Foil M.D.   On: 01/02/2021 20:23   Recent Labs    01/02/21 2308  WBC 4.3  HGB 12.3  HCT 36.9  PLT 174   Recent Labs    01/01/21 0507  NA 142  K 4.0  CL 109  CO2 21*  GLUCOSE 98  BUN 33*  CREATININE 1.52*  CALCIUM 9.2    Intake/Output Summary (Last 24 hours) at 01/03/2021 0946 Last data filed at 01/02/2021 2008 Gross per 24 hour  Intake 620 ml  Output --  Net 620 ml        Physical Exam: Vital Signs Blood pressure (!) 160/84, pulse 88, temperature (!) 102.2 F (39 C), temperature source Oral, resp. rate 18, height 5\' 8"  (1.727 m), weight 102.5 kg, SpO2 100 %. Gen: no distress, normal appearing, appears fatigued HEENT: oral mucosa pink and moist, NCAT Cardio: Irregular Chest: normal effort, normal rate of breathing Abd: soft, non-distended Ext: no edema Psych: pleasant, normal affect Skin: intact Psych: Normal mood.  Normal behavior. Musc: No edema in extremities.  No tenderness in extremities. Neuro: Alert Dysarthria, unchanged Dysphonia Motor: RUE/RLE: 5/5 proximal distal LUE: Shoulder abduction 3/5, distally 4-4+/5 with apraxia, stable LLE: 4+/5 proximal  distal   Assessment/Plan: 1. Functional deficits which require 3+ hours per day of interdisciplinary therapy in a comprehensive inpatient rehab setting.  Physiatrist is providing close team supervision and 24 hour management of active medical problems listed below.  Physiatrist and rehab team continue to assess barriers to discharge/monitor patient progress toward functional and medical goals  Care Tool:  Bathing    Body parts bathed by patient: Right arm,Left arm,Chest,Abdomen,Front perineal area,Buttocks,Right upper leg,Left upper leg,Face   Body parts bathed by helper: Chest,Abdomen,Buttocks,Right lower leg,Left lower leg,Right arm     Bathing assist Assist Level: Moderate Assistance - Patient 50 - 74%     Upper Body Dressing/Undressing Upper body dressing   What is the patient wearing?: Pull over shirt,Bra    Upper body assist Assist Level: Moderate Assistance - Patient 50 - 74%    Lower Body Dressing/Undressing Lower body dressing      What is the patient wearing?: Underwear/pull up,Pants     Lower body assist Assist for lower body dressing: Minimal Assistance - Patient > 75%     Toileting Toileting    Toileting assist Assist for toileting: Supervision/Verbal cueing     Transfers Chair/bed transfer  Transfers assist     Chair/bed transfer assist level: Contact Guard/Touching assist     Locomotion Ambulation   Ambulation assist      Assist level: Minimal Assistance - Patient > 75% Assistive device: Cane-straight Max distance: 138ft   Walk 10 feet activity   Assist     Assist level: Minimal Assistance - Patient >  75% Assistive device: Cane-straight   Walk 50 feet activity   Assist    Assist level: Minimal Assistance - Patient > 75% Assistive device: Cane-straight    Walk 150 feet activity   Assist    Assist level: Minimal Assistance - Patient > 75% Assistive device: Cane-straight    Walk 10 feet on uneven surface   activity   Assist Walk 10 feet on uneven surfaces activity did not occur: Safety/medical concerns         Wheelchair     Assist Will patient use wheelchair at discharge?: No             Wheelchair 50 feet with 2 turns activity    Assist            Wheelchair 150 feet activity     Assist          Medical Problem List and Plan: 1.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 Antithrombotics: -DVT/anticoagulation:Pradaxa CBC ordered for Monday -antiplatelet therapy: Aspirin 81 mg daily 3. Pain Management:Tylenol as needed 4. Mood:Provide emotional support -antipsychotic agents: N/A 5. Neuropsych: This patientiscapable of making decisions on herown behalf. 6. Skin/Wound Care:Routine skin checks 7. Fluids/Electrolytes/Nutrition:Routine in and outs. 8. Atrial fibrillation. Continue Pradaxa as well as Mexitil 300 mg every 12 hours  ECG showing PVCs and PACs- no changes per Cards. 9. Hypertension.    -Coreg 3.125 mg twice daily.    Hydralazine increased to 50 3 times daily on 1/10, increased to 75 3 times daily on 1/12, increased to 100 TID on 1/14 (100 3 times daily PTA)  ?Elevated and labile on 1/14 10. Diastolic congestive heart failure. Monitor for any signs of fluid overload Filed Weights   01/01/21 0512 01/02/21 0404 01/03/21 0500  Weight: 101.7 kg 104.6 kg 102.5 kg   ?Reliability on 1/14 11. CKD stage III. Baseline creatinine 1.55-2.38.   -on demadex 40mg  bid at home  Creatinine 1.52 on 1/13, labs ordered for Monday  Continue to monitor 12. Diabetes mellitus with peripheral neuropathy. Hemoglobin A1c 8.0.   Lantus insulin 20 units daily.   [name] 10mg  daily at home, resumed on 1/9  Labile on 1/14, monitor for trend  Monitor with increased mobility 13. Hyperlipidemia. Lipitor 14. Obesity. BMI 34.06. Dietary  follow-up 15.  Hypokalemia  Potassium 4.0 on 1/13, labs ordered for Monday  Supplemented x1 day on 1/10  Continue to monitor 16.  Slow transit constipation  Bowel meds increased on 114 17.  Sore throat  Chloraseptic Spray ordered  Lidocaine viscous solution ordered since patient has not yet received chloraseptic spray 18: Fever of unknown source: +cough, sore throat, mucus production, fever rising despite IV abx, BC and UC drawn prior to antibiotic initiation and are pending. CXR shows no pneumonia. Continue broad spectrum abx while workup is pending and patient is febrile 19. Atelectasis on CXR: Recommend incentive spirometer 10x/hr, device ordered 20. MEWS 5 given fever, HR, HTN: COVID test ordered as fever continues to increase despite abx. Rapid response called and patient without CP or SOB but transfer recommended given rising fever for closer monitoring. Hospitalist consulted for recommendations as assessment for transfer to acute. CBC and lactic acid ordered  >35 minutes spent in physical examination of patient, review of her chart, discussion of her symptoms, ordering and review of her labs, discussion of her continued fever with her RN and rapid response nurse, consulting IM hospitalist, ordering medications for her throat pain and  repeat labs  LOS: 7 days A FACE TO FACE EVALUATION WAS PERFORMED  Clide Deutscher Latiqua Daloia 01/03/2021, 9:46 AM

## 2021-01-04 ENCOUNTER — Other Ambulatory Visit: Payer: Self-pay

## 2021-01-04 ENCOUNTER — Observation Stay (HOSPITAL_COMMUNITY): Payer: Self-pay

## 2021-01-04 LAB — BRAIN NATRIURETIC PEPTIDE: B Natriuretic Peptide: 62.5 pg/mL (ref 0.0–100.0)

## 2021-01-04 LAB — CBC
HCT: 35.9 % — ABNORMAL LOW (ref 36.0–46.0)
Hemoglobin: 11.8 g/dL — ABNORMAL LOW (ref 12.0–15.0)
MCH: 28.5 pg (ref 26.0–34.0)
MCHC: 32.9 g/dL (ref 30.0–36.0)
MCV: 86.7 fL (ref 80.0–100.0)
Platelets: 149 10*3/uL — ABNORMAL LOW (ref 150–400)
RBC: 4.14 MIL/uL (ref 3.87–5.11)
RDW: 13.8 % (ref 11.5–15.5)
WBC: 6.4 10*3/uL (ref 4.0–10.5)
nRBC: 0 % (ref 0.0–0.2)

## 2021-01-04 LAB — BASIC METABOLIC PANEL
Anion gap: 10 (ref 5–15)
BUN: 31 mg/dL — ABNORMAL HIGH (ref 6–20)
CO2: 22 mmol/L (ref 22–32)
Calcium: 8.7 mg/dL — ABNORMAL LOW (ref 8.9–10.3)
Chloride: 109 mmol/L (ref 98–111)
Creatinine, Ser: 2.23 mg/dL — ABNORMAL HIGH (ref 0.44–1.00)
GFR, Estimated: 26 mL/min — ABNORMAL LOW (ref >60)
Glucose, Bld: 202 mg/dL — ABNORMAL HIGH (ref 70–99)
Potassium: 3.5 mmol/L (ref 3.5–5.1)
Sodium: 141 mmol/L (ref 135–145)

## 2021-01-04 LAB — C-REACTIVE PROTEIN: CRP: 12.6 mg/dL — ABNORMAL HIGH (ref <1.0)

## 2021-01-04 LAB — GLUCOSE, CAPILLARY
Glucose-Capillary: 182 mg/dL — ABNORMAL HIGH (ref 70–99)
Glucose-Capillary: 243 mg/dL — ABNORMAL HIGH (ref 70–99)
Glucose-Capillary: 170 mg/dL — ABNORMAL HIGH (ref 70–99)

## 2021-01-04 LAB — MAGNESIUM: Magnesium: 2.4 mg/dL (ref 1.7–2.4)

## 2021-01-04 LAB — D-DIMER, QUANTITATIVE: D-Dimer, Quant: 0.48 ug/mL-FEU (ref 0.00–0.50)

## 2021-01-04 MED ORDER — INSULIN GLARGINE 100 UNIT/ML ~~LOC~~ SOLN
20.0000 [IU] | SUBCUTANEOUS | Status: DC
  Administered 2021-01-04 – 2021-01-06 (×3): 20 [IU] via SUBCUTANEOUS
  Filled 2021-01-04 (×4): qty 0.2

## 2021-01-04 MED ORDER — ASPIRIN 81 MG PO CHEW
81.0000 mg | CHEWABLE_TABLET | Freq: Every day | ORAL | Status: DC
  Administered 2021-01-04 – 2021-01-06 (×3): 81 mg via ORAL
  Filled 2021-01-04 (×3): qty 1

## 2021-01-04 MED ORDER — DABIGATRAN ETEXILATE MESYLATE 150 MG PO CAPS
150.0000 mg | ORAL_CAPSULE | Freq: Two times a day (BID) | ORAL | Status: DC
  Administered 2021-01-04 – 2021-01-06 (×6): 150 mg via ORAL
  Filled 2021-01-04 (×7): qty 1

## 2021-01-04 MED ORDER — CALCIUM CARBONATE 1250 (500 CA) MG PO TABS
1.0000 | ORAL_TABLET | Freq: Every day | ORAL | Status: DC
  Administered 2021-01-04 – 2021-01-06 (×3): 500 mg via ORAL
  Filled 2021-01-04 (×3): qty 1

## 2021-01-04 MED ORDER — ATORVASTATIN CALCIUM 80 MG PO TABS
80.0000 mg | ORAL_TABLET | Freq: Every evening | ORAL | Status: DC
  Administered 2021-01-04 – 2021-01-05 (×2): 80 mg via ORAL
  Filled 2021-01-04 (×2): qty 1

## 2021-01-04 MED ORDER — SODIUM CHLORIDE 0.9 % IV SOLN
200.0000 mg | Freq: Once | INTRAVENOUS | Status: AC
  Administered 2021-01-04: 200 mg via INTRAVENOUS
  Filled 2021-01-04: qty 40

## 2021-01-04 MED ORDER — POLYETHYLENE GLYCOL 3350 17 G PO PACK
17.0000 g | PACK | Freq: Two times a day (BID) | ORAL | Status: DC
  Administered 2021-01-04: 17 g via ORAL
  Filled 2021-01-04 (×5): qty 1

## 2021-01-04 MED ORDER — LIDOCAINE VISCOUS HCL 2 % MT SOLN
15.0000 mL | Freq: Four times a day (QID) | OROMUCOSAL | Status: DC | PRN
  Filled 2021-01-04: qty 15

## 2021-01-04 MED ORDER — LACTATED RINGERS IV SOLN: INTRAVENOUS | Status: AC

## 2021-01-04 MED ORDER — HYDRALAZINE HCL 50 MG PO TABS
100.0000 mg | ORAL_TABLET | Freq: Three times a day (TID) | ORAL | Status: DC
  Administered 2021-01-04 – 2021-01-06 (×9): 100 mg via ORAL
  Filled 2021-01-04 (×9): qty 2

## 2021-01-04 MED ORDER — DEXAMETHASONE SODIUM PHOSPHATE 10 MG/ML IJ SOLN
6.0000 mg | INTRAMUSCULAR | Status: DC
  Administered 2021-01-04 – 2021-01-05 (×2): 6 mg via INTRAVENOUS
  Filled 2021-01-04 (×4): qty 0.6

## 2021-01-04 MED ORDER — INSULIN ASPART 100 UNIT/ML ~~LOC~~ SOLN
5.0000 [IU] | Freq: Three times a day (TID) | SUBCUTANEOUS | Status: DC
  Administered 2021-01-04 – 2021-01-06 (×7): 5 [IU] via SUBCUTANEOUS

## 2021-01-04 MED ORDER — INSULIN ASPART 100 UNIT/ML ~~LOC~~ SOLN
0.0000 [IU] | Freq: Three times a day (TID) | SUBCUTANEOUS | Status: DC
  Administered 2021-01-04 (×2): 3 [IU] via SUBCUTANEOUS
  Administered 2021-01-04: 5 [IU] via SUBCUTANEOUS
  Administered 2021-01-05: 8 [IU] via SUBCUTANEOUS
  Administered 2021-01-05: 2 [IU] via SUBCUTANEOUS
  Administered 2021-01-06: 3 [IU] via SUBCUTANEOUS

## 2021-01-04 MED ORDER — GLUCERNA SHAKE PO LIQD
237.0000 mL | Freq: Two times a day (BID) | ORAL | Status: DC
  Administered 2021-01-04 – 2021-01-06 (×4): 237 mL via ORAL

## 2021-01-04 MED ORDER — PHENOL 1.4 % MT LIQD
1.0000 | OROMUCOSAL | Status: DC | PRN
  Filled 2021-01-04: qty 177

## 2021-01-04 MED ORDER — AMLODIPINE BESYLATE 5 MG PO TABS
5.0000 mg | ORAL_TABLET | Freq: Every day | ORAL | Status: DC
  Administered 2021-01-04 – 2021-01-05 (×2): 5 mg via ORAL
  Filled 2021-01-04 (×2): qty 1

## 2021-01-04 MED ORDER — SODIUM CHLORIDE 0.9% FLUSH
3.0000 mL | Freq: Two times a day (BID) | INTRAVENOUS | Status: DC
  Administered 2021-01-04 – 2021-01-06 (×3): 3 mL via INTRAVENOUS

## 2021-01-04 MED ORDER — DAPAGLIFLOZIN PROPANEDIOL 10 MG PO TABS
10.0000 mg | ORAL_TABLET | Freq: Every day | ORAL | Status: DC
  Administered 2021-01-04 – 2021-01-06 (×3): 10 mg via ORAL
  Filled 2021-01-04 (×3): qty 1

## 2021-01-04 MED ORDER — SENNOSIDES-DOCUSATE SODIUM 8.6-50 MG PO TABS
1.0000 | ORAL_TABLET | Freq: Every day | ORAL | Status: DC
  Administered 2021-01-04 – 2021-01-05 (×3): 1 via ORAL
  Filled 2021-01-04 (×3): qty 1

## 2021-01-04 MED ORDER — MEXILETINE HCL 150 MG PO CAPS
300.0000 mg | ORAL_CAPSULE | Freq: Two times a day (BID) | ORAL | Status: DC
  Administered 2021-01-04 – 2021-01-06 (×6): 300 mg via ORAL
  Filled 2021-01-04 (×7): qty 2

## 2021-01-04 MED ORDER — SODIUM CHLORIDE 0.9 % IV SOLN
100.0000 mg | Freq: Every day | INTRAVENOUS | Status: AC
  Administered 2021-01-05 – 2021-01-06 (×2): 100 mg via INTRAVENOUS
  Filled 2021-01-04 (×3): qty 20

## 2021-01-04 MED ORDER — ADULT MULTIVITAMIN W/MINERALS CH
1.0000 | ORAL_TABLET | Freq: Every day | ORAL | Status: DC
  Administered 2021-01-04 – 2021-01-06 (×3): 1 via ORAL
  Filled 2021-01-04 (×3): qty 1

## 2021-01-04 MED ORDER — POTASSIUM CHLORIDE CRYS ER 20 MEQ PO TBCR
40.0000 meq | EXTENDED_RELEASE_TABLET | Freq: Once | ORAL | Status: AC
  Administered 2021-01-04: 40 meq via ORAL
  Filled 2021-01-04: qty 2

## 2021-01-04 NOTE — Progress Notes (Signed)
OT Cancellation Note  Patient Details Name: Adrienne Lane MRN: [id number] DOB: 1966/10/08   Cancelled Treatment:    Reason Eval/Treat Not Completed: Patient at procedure or test/ unavailable;Other (comment) (Pt having bladder scan done. OT to follow for OT evaluation next available treatment time.)   Jefferey Pica, OTR/L Acute Rehabilitation Services Pager: [phone] Office: [phone]    [name] C 01/04/2021, 8:55 AM

## 2021-01-04 NOTE — Progress Notes (Signed)
PROGRESS NOTE                                                                                                                                                                                                             Patient Demographics:    Adrienne Lane, is a 55 y.o. female, DOB - 05-18-1966, IWP:[id number]  Outpatient Primary MD for the patient is Nicolette Bang, DO    LOS - 0  Admit date - 01/03/2021    CC - Covid from SNF     Brief Narrative (HPI from H&P) - Adrienne Lane is a 55 y.o. female with medical history significant of CVA, diabetes, CKD 3, A. fib, constipation, CHF, hypertension, normocytic anemia, hyperlipidemia who is being evaluated for acute febrile illness for the past 2 days at [facility] inpatient rehab, she has so far taken 1 COVID-19 shot and was waiting for her second shot which was due soon, her work-up suggestive of COVID-19 infection and she was admitted to inpatient hospital from the rehab.   Subjective:    Karl Luke today has, No headache, No chest pain, No abdominal pain - No Nausea, No new weakness tingling or numbness, no SOB.   Assessment  & Plan :    Principal Problem:   Febrile illness, acute Active Problems:   CHF (congestive heart failure) (HCC)   Type 2 diabetes mellitus (HCC)   Essential hypertension   Frequent PVCs   Hyperlipidemia   Acute CVA (cerebrovascular accident) ([place])   Poorly controlled type 2 diabetes mellitus with peripheral neuropathy (HCC)   Stage 3b chronic kidney disease (HCC)   PAF (paroxysmal atrial fibrillation) (HCC)   Irregular cardiac rhythm   1. Acute Covid 19 Viral infection in a patient with partial one-shot vaccination from mRNA vaccine few weeks ago - she far seems to have mild viral illness, no evidence of pulmonary involvement yet, chest x-ray stable, will place her on low-dose steroids and 3 days of remdesivir and monitor.  She is  agreeable to using Actemra if needed  Encouraged the patient to sit up in chair in the daytime use I-S and flutter valve for pulmonary toiletry and then prone in bed when at night.  Will advance activity and titrate down oxygen as possible.  Actemra/Baricitinib  off label use - patient was told that if COVID-19 pneumonitis gets  worse we might potentially use Actemra off label, patient denies any known history of active diverticulitis, tuberculosis or hepatitis, understands the risks and benefits and wants to proceed with Actemra treatment if required.     SpO2: 99 %  Recent Labs  Lab 12/29/20 0603 01/02/21 2308 01/03/21 1714 01/04/21 0230 01/04/21 0804  WBC 4.6 4.3  --  6.4  --   HGB 12.0 12.3  --  11.8*  --   HCT 36.4 36.9  --  35.9*  --   PLT 175 174  --  149*  --   CRP  --   --   --   --  12.6*  BNP  --   --   --   --  62.5  DDIMER  --   --   --   --  0.48  AST 20  --   --   --   --   ALT 17  --   --   --   --   ALKPHOS 75  --   --   --   --   BILITOT 0.8  --   --   --   --   ALBUMIN 2.9*  --   --   --   --   SARSCOV2NAA  --   --  POSITIVE*  --   --     2. 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  Was in inpatient rehab, continue PT OT, continue Pradaxa, statin and aspirin combination for secondary prevention.  3. Obesity.  BMI 34.  Follow-up with PCP for weight loss post discharge.  4. Dyslipidemia.  On statin.  5. hypertension.  Continue Coreg and Norvasc.  6. Paroxysmal atrial fibrillation [place] vas 2 score of greater than 4.  Continue Coreg and Pradaxa combination.  7. DM type II on Lantus and sliding scale, will monitor and adjust.  Lab Results  Component Value Date   HGBA1C 8.0 (H) 12/25/2020   CBG (last 3)  Recent Labs    01/03/21 1604 01/03/21 2135 01/04/21 0758  GLUCAP 150* 323* 182*        Condition - Fair  Family Communication  : husband Dareen [phone] on 01/04/2021  Code Status :  Full  Consults  :  None  Procedures  :      PUD Prophylaxis :    Disposition Plan  :    Status is: Inpt  Dispo: The patient is from: SNF              Anticipated d/c is to: SNF              Anticipated d/c date is: 3 days              Patient currently is not medically stable to d/c.  DVT Prophylaxis  :  Dabigatran  Lab Results  Component Value Date   PLT 149 (L) 01/04/2021    Diet :  Diet Order            Diet Heart Room service appropriate? Yes; Fluid consistency: Thin  Diet effective now                  Inpatient Medications  Scheduled Meds: . amLODipine  5 mg Oral Daily  . aspirin  81 mg Oral Daily  . atorvastatin  80 mg Oral QPM  . calcium carbonate  1 tablet Oral Q breakfast  . carvedilol  3.125 mg Oral BID WC  . dabigatran  150 mg Oral Q12H  . dapagliflozin propanediol  10 mg Oral Daily  . feeding supplement (GLUCERNA SHAKE)  237 mL Oral BID BM  . hydrALAZINE  100 mg Oral Q8H  . insulin aspart  0-15 Units Subcutaneous TID WC  . insulin glargine  20 Units Subcutaneous Q24H  . mexiletine  300 mg Oral Q12H  . multivitamin with minerals  1 tablet Oral Daily  . polyethylene glycol  17 g Oral BID  . senna-docusate  1 tablet Oral QHS  . sodium chloride flush  3 mL Intravenous Q12H   Continuous Infusions: . lactated ringers 100 mL/hr at 01/04/21 0837   PRN Meds:.lidocaine, phenol  Antibiotics  :    Anti-infectives (From admission, onward)   Start     Dose/Rate Route Frequency Ordered Stop   01/04/21 1000  vancomycin (VANCOCIN) IVPB 1000 mg/200 mL premix  Status:  Discontinued        1,000 mg 200 mL/hr over 60 Minutes Intravenous Every 24 hours 01/03/21 2114 01/03/21 2311   01/03/21 2300  piperacillin-tazobactam (ZOSYN) 3.375 g in dextrose 5 % 50 mL IVPB  Status:  Discontinued        3.375 g 100 mL/hr over 30 Minutes Intravenous Every 8 hours 01/03/21 1851 01/03/21 2311   01/03/21 2230  vancomycin (VANCOCIN) 1,000 mg in sodium chloride 0.9 % 500 mL IVPB  Status:  Discontinued        1,000  mg 250 mL/hr over 120 Minutes Intravenous Every 12 hours 01/03/21 1851 01/03/21 1932   01/03/21 2200  vancomycin (VANCOREADY) IVPB 1500 mg/300 mL  Status:  Discontinued        1,500 mg 150 mL/hr over 120 Minutes Intravenous  Once 01/03/21 2109 01/03/21 2114   01/03/21 2030  vancomycin (VANCOREADY) IVPB 2000 mg/400 mL  Status:  Discontinued        2,000 mg 200 mL/hr over 120 Minutes Intravenous  Once 01/03/21 1934 01/03/21 2109       Time Spent in minutes  30   Lala Lund M.D on 01/04/2021 at 10:21 AM  To page go to [email or web address]   [facility] -  Office  [phone]   See all Orders from today for further details    Objective:   Vitals:   01/04/21 0119  BP: 130/70  Pulse: 82  Resp: 19  Temp: 99.6 F (37.6 C)  TempSrc: Oral  SpO2: 99%    Wt Readings from Last 3 Encounters:  01/03/21 102.5 kg  12/25/20 101.6 kg  10/23/20 101.6 kg     Intake/Output Summary (Last 24 hours) at 01/04/2021 1021 Last data filed at 01/04/2021 0349 Gross per 24 hour  Intake 360 ml  Output 650 ml  Net -290 ml     Physical Exam  Awake Alert, mild left-sided weakness with facial droop [place].AT,PERRAL Supple Neck,No JVD, No cervical lymphadenopathy appriciated.  Symmetrical Chest wall movement, Good air movement bilaterally, CTAB RRR,No Gallops,Rubs or new Murmurs, No Parasternal Heave +ve B.Sounds, Abd Soft, No tenderness, No organomegaly appriciated, No rebound - guarding or rigidity. No Cyanosis, Clubbing or edema, No new Rash or bruise      Data Review:    CBC Recent Labs  Lab 12/29/20 0603 01/02/21 2308 01/04/21 0230  WBC 4.6 4.3 6.4  HGB 12.0 12.3 11.8*  HCT 36.4 36.9 35.9*  PLT 175 174 149*  MCV 86.9 86.6 86.7  MCH 28.6 28.9 28.5  MCHC 33.0 33.3 32.9  RDW 13.2 13.6 13.8  LYMPHSABS 1.3 0.5*  --  MONOABS 0.4 0.6  --   EOSABS 0.2 0.0  --   BASOSABS 0.0 0.0  --     Recent Labs  Lab 12/29/20 0603 01/01/21 0507 01/04/21 0230 01/04/21 0804  NA 139  142 141  --   K 3.4* 4.0 3.5  --   CL 106 109 109  --   CO2 24 21* 22  --   GLUCOSE 125* 98 202*  --   BUN 31* 33* 31*  --   CREATININE 1.77* 1.52* 2.23*  --   CALCIUM 9.3 9.2 8.7*  --   AST 20  --   --   --   ALT 17  --   --   --   ALKPHOS 75  --   --   --   BILITOT 0.8  --   --   --   ALBUMIN 2.9*  --   --   --   MG  --   --   --  2.4  CRP  --   --   --  12.6*  DDIMER  --   --   --  0.48  BNP  --   --   --  62.5    ------------------------------------------------------------------------------------------------------------------ No results for input(s): CHOL, HDL, LDLCALC, TRIG, CHOLHDL, LDLDIRECT in the last 72 hours.  Lab Results  Component Value Date   HGBA1C 8.0 (H) 12/25/2020   ------------------------------------------------------------------------------------------------------------------ No results for input(s): TSH, T4TOTAL, T3FREE, THYROIDAB in the last 72 hours.  Invalid input(s): FREET3  Cardiac Enzymes No results for input(s): CKMB, TROPONINI, MYOGLOBIN in the last 168 hours.  Invalid input(s): CK ------------------------------------------------------------------------------------------------------------------    Component Value Date/Time   BNP 62.5 01/04/2021 0804    Micro Results Recent Results (from the past 240 hour(s))  Culture, Urine     Status: None (Preliminary result)   Collection Time: 01/02/21  5:34 AM   Specimen: Urine, Random  Result Value Ref Range Status   Specimen Description URINE, RANDOM  Final   Special Requests NONE  Final   Culture   Final    CULTURE REINCUBATED FOR BETTER GROWTH Performed at [facility], [address] [place], [place] [address]    Report Status PENDING  Incomplete  Culture, blood (routine x 2)     Status: None (Preliminary result)   Collection Time: 01/02/21 11:10 PM   Specimen: BLOOD  Result Value Ref Range Status   Specimen Description BLOOD LEFT ANTECUBITAL  Final   Special Requests   Final     BOTTLES DRAWN AEROBIC AND ANAEROBIC Blood Culture adequate volume   Culture   Final    NO GROWTH 1 DAY Performed at [facility], [place] [address], [place], [place] [address]    Report Status PENDING  Incomplete  Culture, blood (routine x 2)     Status: None (Preliminary result)   Collection Time: 01/02/21 11:18 PM   Specimen: BLOOD LEFT HAND  Result Value Ref Range Status   Specimen Description BLOOD LEFT HAND  Final   Special Requests AEROBIC BOTTLE ONLY Blood Culture adequate volume  Final   Culture   Final    NO GROWTH 1 DAY Performed at [facility], [place] [address], [place], [place] [address]    Report Status PENDING  Incomplete  MRSA PCR Screening     Status: None   Collection Time: 01/03/21  2:06 PM   Specimen: Nasal Mucosa; Nasopharyngeal  Result Value Ref Range Status   MRSA  by PCR NEGATIVE NEGATIVE Final    Comment:        The GeneXpert MRSA Assay (FDA approved for NASAL specimens only), is one component of a comprehensive MRSA colonization surveillance program. It is not intended to diagnose MRSA infection nor to guide or monitor treatment for MRSA infections. Performed at [facility], [place] [address] [place], [place] [address]   SARS CORONAVIRUS 2 (TAT 6-24 HRS) Nasopharyngeal Nasopharyngeal Swab     Status: Abnormal   Collection Time: 01/03/21  5:14 PM   Specimen: Nasopharyngeal Swab  Result Value Ref Range Status   SARS Coronavirus 2 POSITIVE (A) NEGATIVE Final    Comment: (NOTE) SARS-CoV-2 target nucleic acids are DETECTED.  The SARS-CoV-2 RNA is generally detectable in upper and lower respiratory specimens during the acute phase of infection. Positive results are indicative of the presence of SARS-CoV-2 RNA. Clinical correlation with patient history and other diagnostic information is  necessary to determine patient infection status. Positive results do not rule out bacterial infection or co-infection with other viruses.  The expected  result is Negative.  Fact Sheet for Patients: SugarRoll.be  Fact Sheet for Healthcare Providers: https://www.woods-mathews.com/  This test is not yet approved or cleared by the [place] FDA and  has been authorized for detection and/or diagnosis of SARS-CoV-2 by FDA under an Emergency Use Authorization (EUA). This EUA will remain  in effect (meaning this test can be used) for the duration of the COVID-19 declaration under Section 564(b)(1) of the Act, 21 U. S.C. section 360bbb-3(b)(1), unless the authorization is terminated or revoked sooner.   Performed at [facility], [place] [address] [place], [place] [address]     Radiology Reports CT HEAD WO CONTRAST  Result Date: 12/24/2020 CLINICAL DATA:  Neuro deficit, acute stroke suspected. Abnormal gait and aphasia stray. Today's having left-sided weakness. EXAM: CT HEAD WITHOUT CONTRAST TECHNIQUE: Contiguous axial images were obtained from the base of the skull through the vertex without intravenous contrast. COMPARISON:  None. FINDINGS: Brain: Small area of hypoattenuation in the left periatrial white matter (see series 5, image 44 and series 3, image 15). Additional small area of hypoattenuation in the right corona radiata. No acute hemorrhage. No hydrocephalus. No mass lesion. No abnormal mass effect. No midline shift. No extra-axial fluid collection. Vascular: No hyperdense vessel identified. Calcific atherosclerosis. Skull: No acute fracture. Punctate foreign body in the subcutaneous high posterior left scalp without soft tissue swelling. Sinuses/Orbits: No acute finding. Other: No mastoid effusions. IMPRESSION: 1. Small area of hypoattenuation in the left periatrial white matter could represent chronic microvascular ischemic disease or age indeterminate lacunar infarct. Additional age indeterminate lacunar infarct within the right corona radiata, favored remote. If there is concern for acute  infarct, MRI could further evaluate. 2. No acute hemorrhage. 3. Punctate foreign body in the subcutaneous high posterior left scalp, likely remote given no scalp swelling. Electronically Signed   By: Margaretha Sheffield MD   On: 12/24/2020 12:30   MR ANGIO HEAD WO CONTRAST  Result Date: 12/24/2020 CLINICAL DATA:  Follow-up examination for acute stroke. EXAM: MRA HEAD WITHOUT CONTRAST TECHNIQUE: Angiographic images of the Circle of Willis were obtained using MRA technique without intravenous contrast. COMPARISON:  Prior MRI from earlier the same day. FINDINGS: ANTERIOR CIRCULATION: Visualized distal cervical segments of the internal carotid arteries are widely patent with symmetric antegrade flow. Petrous, cavernous, and supraclinoid segments widely patent without stenosis or other abnormality. A1 segments widely patent. Normal anterior communicating artery complex.  Anterior cerebral arteries widely patent to their distal aspects. No M1 stenosis or occlusion. Normal MCA bifurcations. Distal MCA branches well perfused and symmetric. POSTERIOR CIRCULATION: Both V4 segments widely patent to the vertebrobasilar junction without stenosis. Left vertebral artery dominant. Both PICA origins patent and normal. Basilar widely patent to its distal aspect. Superior cerebellar arteries patent bilaterally. Both PCAs supplied via the basilar as well as small bilateral posterior communicating arteries. PCAs well perfused to their distal aspects without stenosis. No intracranial aneurysm or other vascular abnormality. IMPRESSION: Normal intracranial MRA. No large vessel occlusion, hemodynamically significant stenosis, or other acute vascular abnormality. Electronically Signed   By: Jeannine Boga M.D.   On: 12/24/2020 22:40   MR BRAIN WO CONTRAST  Result Date: 12/24/2020 CLINICAL DATA:  Neuro deficit, acute stroke suspected. EXAM: MRI HEAD WITHOUT CONTRAST TECHNIQUE: Multiplanar, multiecho pulse sequences of the brain and  surrounding structures were obtained without intravenous contrast. COMPARISON:  Same day head CT. FINDINGS: Brain: Acute infarct within the right hemi pons. Mild associated edema without substantial mass effect. Small acute infarct in the left frontal subcortical white matter (see series 2 and 250, image 29) without associated edema. Small remote lacunar infarcts in the right corona radiata and left periatrial white matter. No hydrocephalus. No acute hemorrhage. No mass lesion. No extra-axial fluid collection. Vascular: Major arterial flow voids are maintained at the skull base. Skull and upper cervical spine: Normal marrow signal. Sinuses/Orbits: Sinuses are clear.  Unremarkable orbits. IMPRESSION: 1. Acute infarct in the right hemipons. Additional small acute infarct in the left frontal subcortical white matter. 2. Mild associated edema in the right pons without substantial mass effect. 3. Small remote lacunar infarcts in the right corona radiata and left periatrial white matter. Findings discussed with Dr. Shelly Coss via telephone at 2:02 PM. Electronically Signed   By: Margaretha Sheffield MD   On: 12/24/2020 14:05   DG Chest Port 1 View  Result Date: 01/04/2021 CLINICAL DATA:  COVID positive, dyspnea EXAM: PORTABLE CHEST 1 VIEW COMPARISON:  01/02/2021 chest radiograph. FINDINGS: Stable cardiomediastinal silhouette with normal heart size. No pneumothorax. No pleural effusion. Lungs appear clear, with no acute consolidative airspace disease and no pulmonary edema. IMPRESSION: No active disease. Electronically Signed   By: Ilona Sorrel M.D.   On: 01/04/2021 08:34   DG CHEST PORT 1 VIEW  Result Date: 01/02/2021 CLINICAL DATA:  Fever EXAM: PORTABLE CHEST 1 VIEW COMPARISON:  02/22/2020 FINDINGS: Low lung volumes. No focal airspace disease or pleural effusion. No pneumothorax. Minimal atelectasis right base IMPRESSION: Low lung volumes with minimal atelectasis right base. Electronically Signed   By: Donavan Foil  M.D.   On: 01/02/2021 20:23   ECHOCARDIOGRAM COMPLETE  Result Date: 12/25/2020    ECHOCARDIOGRAM REPORT   Patient Name:   MONSERATT LEDIN Date of Exam: 12/25/2020 Medical Rec #:  [id number]      Height:       68.0 in Accession #:    [id number]     Weight:       224.0 lb Date of Birth:  [birth year]      BSA:          2.145 m Patient Age:    13 years       BP:           137/71 mmHg Patient Gender: F              HR:           81 bpm. Exam Location:  Inpatient Procedure: 2D Echo, Color Doppler and Cardiac Doppler Indications:    Stroke i163.96  History:        Patient has prior history of Echocardiogram examinations, most                 recent 06/16/2020. CHF, Arrythmias:Atrial Fibrillation; Risk                 Factors:Hypertension, Diabetes and Dyslipidemia.  Sonographer:    Raquel Sarna Senior RDCS Referring Phys: [place]  1. Since the last study on 06/16/20 LVEF remains mildly decreased with diffuse hypokinesis and LVEF 45-50%. RVEF is mildly decreased, RV is now less dilated and RVSP has improved from 50 to 30 mmHg.  2. Left ventricular ejection fraction, by estimation, is 45 to 50%. The left ventricle has mildly decreased function. The left ventricle demonstrates global hypokinesis. Left ventricular diastolic function could not be evaluated.  3. Right ventricular systolic function is mildly reduced. The right ventricular size is normal. There is normal pulmonary artery systolic pressure. The estimated right ventricular systolic pressure is 40.3 mmHg.  4. Left atrial size was mildly dilated.  5. The mitral valve is normal in structure. Mild mitral valve regurgitation. No evidence of mitral stenosis.  6. The aortic valve is normal in structure. Aortic valve regurgitation is not visualized. No aortic stenosis is present.  7. The inferior vena cava is normal in size with <50% respiratory variability, suggesting right atrial pressure of 8 mmHg. FINDINGS  Left Ventricle: Left ventricular ejection  fraction, by estimation, is 45 to 50%. The left ventricle has mildly decreased function. The left ventricle demonstrates global hypokinesis. The left ventricular internal cavity size was normal in size. There is  no left ventricular hypertrophy. Left ventricular diastolic function could not be evaluated due to atrial fibrillation. Left ventricular diastolic function could not be evaluated. Right Ventricle: The right ventricular size is normal. No increase in right ventricular wall thickness. Right ventricular systolic function is mildly reduced. There is normal pulmonary artery systolic pressure. The tricuspid regurgitant velocity is 2.35 m/s, and with an assumed right atrial pressure of 8 mmHg, the estimated right ventricular systolic pressure is 47.4 mmHg. Left Atrium: Left atrial size was mildly dilated. Right Atrium: Right atrial size was normal in size. Pericardium: There is no evidence of pericardial effusion. Mitral Valve: The mitral valve is normal in structure. Mild mitral valve regurgitation. No evidence of mitral valve stenosis. Tricuspid Valve: The tricuspid valve is normal in structure. Tricuspid valve regurgitation is mild . No evidence of tricuspid stenosis. Aortic Valve: The aortic valve is normal in structure. Aortic valve regurgitation is not visualized. No aortic stenosis is present. Pulmonic Valve: The pulmonic valve was normal in structure. Pulmonic valve regurgitation is not visualized. No evidence of pulmonic stenosis. Aorta: The aortic root is normal in size and structure. Venous: The inferior vena cava is normal in size with less than 50% respiratory variability, suggesting right atrial pressure of 8 mmHg. IAS/Shunts: No atrial level shunt detected by color flow Doppler.  LEFT VENTRICLE PLAX 2D LVIDd:         4.80 cm LVIDs:         4.20 cm LV PW:         1.20 cm LV IVS:        0.70 cm LVOT diam:     2.30 cm LV SV:         54 LV SV Index:   25 LVOT Area:  4.15 cm  LV Volumes (MOD) LV vol  d, MOD A2C: 84.7 ml LV vol d, MOD A4C: 99.4 ml LV vol s, MOD A2C: 60.2 ml LV vol s, MOD A4C: 65.7 ml LV SV MOD A2C:     24.5 ml LV SV MOD A4C:     99.4 ml LV SV MOD BP:      31.4 ml RIGHT VENTRICLE RV S prime:     7.51 cm/s TAPSE (M-mode): 2.3 cm LEFT ATRIUM             Index       RIGHT ATRIUM           Index LA diam:        3.60 cm 1.68 cm/m  RA Area:     15.70 cm LA Vol (A2C):   83.1 ml 38.75 ml/m RA Volume:   41.60 ml  19.40 ml/m LA Vol (A4C):   62.1 ml 28.96 ml/m LA Biplane Vol: 71.2 ml 33.20 ml/m  AORTIC VALVE LVOT Vmax:   66.25 cm/s LVOT Vmean:  43.275 cm/s LVOT VTI:    0.129 m  AORTA Ao Root diam: 2.80 cm TRICUSPID VALVE TR Peak grad:   22.1 mmHg TR Vmax:        235.00 cm/s  SHUNTS Systemic VTI:  0.13 m Systemic Diam: 2.30 cm Ena Dawley MD Electronically signed by Ena Dawley MD Signature Date/Time: 12/25/2020/11:53:23 AM    Final    VAS US CAROTID  Result Date: 12/25/2020 Carotid Arterial Duplex Study Indications:   CVA and Left side weakness, left facial drrop, speech                disturbance. Risk Factors:  Hypertension, Diabetes, no history of smoking. Other Factors: CKD4, CHF, AFIB, Obesity. Performing Technologist: Rogelia Rohrer  Examination Guidelines: A complete evaluation includes B-mode imaging, spectral Doppler, color Doppler, and power Doppler as needed of all accessible portions of each vessel. Bilateral testing is considered an integral part of a complete examination. Limited examinations for reoccurring indications may be performed as noted.  Right Carotid Findings: +----------+--------+--------+--------+------------------+--------+           PSV cm/sEDV cm/sStenosisPlaque DescriptionComments +----------+--------+--------+--------+------------------+--------+ CCA Prox  97      11                                         +----------+--------+--------+--------+------------------+--------+ CCA Distal78      28                                          +----------+--------+--------+--------+------------------+--------+ ICA Prox  65      16      1-39%                              +----------+--------+--------+--------+------------------+--------+ ICA Distal109     25                                         +----------+--------+--------+--------+------------------+--------+ ECA       89      0                                          +----------+--------+--------+--------+------------------+--------+ +----------+--------+-------+----------------+-------------------+  PSV cm/sEDV cmsDescribe        Arm Pressure (mmHG) +----------+--------+-------+----------------+-------------------+ [id number]     0      Multiphasic, WNL                    +----------+--------+-------+----------------+-------------------+ +---------+--------+--+--------+--+---------+ VertebralPSV cm/s61EDV cm/s13Antegrade +---------+--------+--+--------+--+---------+  Left Carotid Findings: +----------+--------+--------+--------+------------------+--------+           PSV cm/sEDV cm/sStenosisPlaque DescriptionComments +----------+--------+--------+--------+------------------+--------+ CCA Prox  107     15                                         +----------+--------+--------+--------+------------------+--------+ CCA Distal84      18                                         +----------+--------+--------+--------+------------------+--------+ ICA Prox  89      25      1-39%                              +----------+--------+--------+--------+------------------+--------+ ICA Distal93      28                                         +----------+--------+--------+--------+------------------+--------+ ECA       64      0                                          +----------+--------+--------+--------+------------------+--------+ +----------+--------+--------+----------------+-------------------+           PSV cm/sEDV  cm/sDescribe        Arm Pressure (mmHG) +----------+--------+--------+----------------+-------------------+ [id number]      11      Multiphasic, WNL                    +----------+--------+--------+----------------+-------------------+ +---------+--------+--+--------+--+---------+ VertebralPSV cm/s63EDV cm/s19Antegrade +---------+--------+--+--------+--+---------+   Summary: Right Carotid: Velocities in the right ICA are consistent with a 1-39% stenosis.                The extracranial vessels were near-normal with only minimal wall                thickening or plaque. Left Carotid: Velocities in the left ICA are consistent with a 1-39% stenosis.               The extracranial vessels were near-normal with only minimal wall               thickening or plaque. Vertebrals:  Bilateral vertebral arteries demonstrate antegrade flow. Subclavians: Normal flow hemodynamics were seen in bilateral subclavian              arteries. *See table(s) above for measurements and observations.     Preliminary

## 2021-01-04 NOTE — Progress Notes (Signed)
Evening glucose was 323, on call contacted.  No new orders were given for coverage. Patient asymptomatic.  On another note: Ted hose has been ordered for patient.

## 2021-01-04 NOTE — Evaluation (Signed)
Occupational Therapy Evaluation Patient Details Name: Adrienne Lane MRN: [id number] DOB: [birth year] Today's Date: 01/04/2021    History of Present Illness 55 y.o. female with medical history significant of CVA, diabetes, CKD 3, A. fib, constipation, CHF, hypertension, normocytic anemia, hyperlipidemia who is being evaluated for acute febrile illness for the past 2 days at [facility] inpatient rehab. Pt recently admitted for acute CVA 1/5-1/8. Pt found to be COVID+ upon admission.   Clinical Impression   Pt currently with recent CVA affecting LUE. Pt with fair activity tolerance and set-upA to minguardA for ADL with increased time. Pt performing light ADL routine and transfering to commode, performing pericare, simulating tub transfer and grooming at sink in standing. Pt supervisionA overall for mobility in room with no AD. tachy 100s with mobility. Pt would benefit from continued OT skilled services for ADL, mobility and LUE HEP in OP setting. OT following acutely      Follow Up Recommendations  Outpatient OT;Supervision - Intermittent    Equipment Recommendations  3 in 1 bedside commode    Recommendations for Other Services       Precautions / Restrictions Precautions Precautions: Fall Precaution Comments: L sided weakness Restrictions Weight Bearing Restrictions: No      Mobility Bed Mobility Overal bed mobility: Modified Independent Bed Mobility: Supine to Sit     Supine to sit: Modified independent (Device/Increase time);HOB elevated          Transfers Overall transfer level: Needs assistance Equipment used: None Transfers: Sit to/from Stand Sit to Stand: Supervision              Balance Overall balance assessment: Needs assistance Sitting-balance support: No upper extremity supported;Feet supported Sitting balance-Leahy Scale: Good     Standing balance support: No upper extremity supported;During functional activity Standing balance-Leahy Scale: Good                              ADL either performed or assessed with clinical judgement   ADL Overall ADL's : Needs assistance/impaired Eating/Feeding: Set up;Sitting   Grooming: Supervision/safety;Standing   Upper Body Bathing: Set up;Sitting   Lower Body Bathing: Min guard;Sitting/lateral leans;Sit to/from stand   Upper Body Dressing : Set up;Sitting   Lower Body Dressing: Min guard;Sitting/lateral leans;Sit to/from stand   Toilet Transfer: Supervision/safety;Ambulation   Toileting- Clothing Manipulation and Hygiene: Supervision/safety;Sit to/from stand   Tub/ Shower Transfer: Tub transfer;Min guard   Functional mobility during ADLs: Supervision/safety General ADL Comments: Pt with recent CVA affecting LUE. Pt with fair activity tolerance and set-upA to minguardA for ADL with increased time. Pt performing light ADL routine and transfering to commode, performing pericare, simulating tub transfer and grooming at sink in standing. Pt supervisionA overall for mobility in room with no AD.     Vision Baseline Vision/History: Wears glasses Wears Glasses: Distance only Patient Visual Report: No change from baseline Vision Assessment?: No apparent visual deficits Additional Comments: Pt appears to haves less to none blurriness in vision     Perception     Praxis      Pertinent Vitals/Pain Pain Assessment: No/denies pain     Hand Dominance Right   Extremity/Trunk Assessment Upper Extremity Assessment Upper Extremity Assessment: LUE deficits/detail LUE Deficits / Details: 2+/5 MM grade overall; finger to thumb opposition intact, sensation intact. Pt able to grip putty to make a fist LUE Sensation: WNL LUE Coordination: decreased fine motor;decreased gross motor   Lower Extremity Assessment Lower Extremity Assessment:  Defer to PT evaluation;Generalized weakness LLE Deficits / Details: grossly 4-/5   Cervical / Trunk Assessment Cervical / Trunk Assessment: Normal    Communication Communication Communication: No difficulties   Cognition Arousal/Alertness: Awake/alert Behavior During Therapy: WFL for tasks assessed/performed Overall Cognitive Status: Within Functional Limits for tasks assessed                                     General Comments  tachy at times with mobility/movement 100sa    Exercises Exercises: Other exercises Other Exercises Other Exercises: [facility] tasks with theraputty Other Exercises: [facility] digit to thumb opposition   Shoulder Instructions      Home Living Family/patient expects to be discharged to:: Private residence Living Arrangements: Spouse/significant other;Other relatives Available Help at Discharge: Family;Available 24 hours/day Type of Home: Mobile home Home Access: Stairs to enter Entrance Stairs-Number of Steps: 5 Entrance Stairs-Rails: Can reach both Home Layout: One level     Bathroom Shower/Tub: Tub/shower unit (pt reports tub shower now, reported walkin with large threshold previously)   Biochemist, clinical: Standard     Home Equipment: Cane - single point          Prior Functioning/Environment Level of Independence: Independent        Comments: pt reports intermittent difficulty with transfers at baseline otherwise independent        OT Problem List: Decreased strength;Decreased activity tolerance;Impaired balance (sitting and/or standing);Decreased range of motion;Decreased safety awareness;Decreased knowledge of precautions;Impaired UE functional use      OT Treatment/Interventions: Self-care/ADL training;Therapeutic exercise;DME and/or AE instruction;Therapeutic activities;Visual/perceptual remediation/compensation;Patient/family education;Balance training    OT Goals(Current goals can be found in the care plan section) Acute Rehab OT Goals Patient Stated Goal: to continue improving LUE OT Goal Formulation: With patient Time For Goal Achievement: 01/18/21 Potential to  Achieve Goals: Good ADL Goals Pt Will Perform Lower Body Dressing: with modified independence;sit to/from stand Pt/caregiver will Perform Home Exercise Program: Increased strength;Left upper extremity;With theraputty;With theraband;With Supervision  OT Frequency: Min 2X/week   Barriers to D/C:            Co-evaluation              AM-PAC OT "6 Clicks" Daily Activity     Outcome Measure Help from another person eating meals?: None Help from another person taking care of personal grooming?: None Help from another person toileting, which includes using toliet, bedpan, or urinal?: A Little Help from another person bathing (including washing, rinsing, drying)?: A Little Help from another person to put on and taking off regular upper body clothing?: None Help from another person to put on and taking off regular lower body clothing?: A Little 6 Click Score: 21   End of Session Nurse Communication: Mobility status  Activity Tolerance: Patient tolerated treatment well Patient left: in chair;with call bell/phone within reach  OT Visit Diagnosis: Unsteadiness on feet (R26.81);Muscle weakness (generalized) (M62.81);Feeding difficulties (R63.3);Hemiplegia and hemiparesis Hemiplegia - Right/Left: Left Hemiplegia - dominant/non-dominant: Non-Dominant Hemiplegia - caused by: Cerebral infarction                Time: 0814-4818 OT Time Calculation (min): 30 min Charges:  OT General Charges $OT Visit: 1 Visit OT Evaluation $OT Eval Moderate Complexity: 1 Mod OT Treatments $Self Care/Home Management : 8-22 mins  Jefferey Pica, OTR/L Acute Rehabilitation Services Pager: [phone] Office: [phone]   Dali Kraner C 01/04/2021, 11:00 AM

## 2021-01-04 NOTE — Evaluation (Addendum)
Physical Therapy Evaluation Patient Details Name: Adrienne Lane MRN: [id number] DOB: 09-29-1966 Today's Date: 01/04/2021   History of Present Illness  55 y.o. female with medical history significant of CVA, diabetes, CKD 3, A. fib, constipation, CHF, hypertension, normocytic anemia, hyperlipidemia who is being evaluated for acute febrile illness for the past 2 days at [facility] inpatient rehab. Pt recently admitted for acute CVA 1/5-1/8. Pt found to be COVID+ upon admission.  Clinical Impression  Pt presents to PT with deficits in gait, balance, strength, power, endurance. Pt demonstrates some LLE weakness with reduced foot clearance, but is able to perform all mobility during this session without physical assistance. Pt expresses some concerns over family possibly contracting COVID from her and their ability to provide her assistance at home if she is quarantined. PT provides suggestions on family assistance if quarantining is necessary. Pt will benefit from continued acute PT services as well as outpatient PT at the time of discharge to continue improving gait and balance.    Follow Up Recommendations Outpatient PT;Supervision - Intermittent    Equipment Recommendations  None recommended by PT (pt already owns cane, present in room)    Recommendations for Other Services       Precautions / Restrictions Precautions Precautions: Fall Precaution Comments: L sided weakness Restrictions Weight Bearing Restrictions: No      Mobility  Bed Mobility Overal bed mobility: Modified Independent Bed Mobility: Supine to Sit     Supine to sit: Modified independent (Device/Increase time);HOB elevated          Transfers Overall transfer level: Needs assistance Equipment used: None Transfers: Sit to/from Stand Sit to Stand: Supervision            Ambulation/Gait Ambulation/Gait assistance: Supervision Gait Distance (Feet): 200 Feet Assistive device: Straight cane;None Gait  Pattern/deviations: Step-to pattern Gait velocity: reduced Gait velocity interpretation: <1.8 ft/sec, indicate of risk for recurrent falls General Gait Details: pt initially ambulates with cane for 25' then switches to no device for remaining 175'. Pt with reduced L foot clearance and increased stance time on RLE  Stairs            Wheelchair Mobility    Modified Rankin (Stroke Patients Only) Modified Rankin (Stroke Patients Only) Pre-Morbid Rankin Score: No symptoms Modified Rankin: Moderately severe disability     Balance Overall balance assessment: Needs assistance Sitting-balance support: No upper extremity supported;Feet supported Sitting balance-Leahy Scale: Good     Standing balance support: No upper extremity supported;During functional activity Standing balance-Leahy Scale: Good                               Pertinent Vitals/Pain Pain Assessment: No/denies pain    Home Living Family/patient expects to be discharged to:: Private residence Living Arrangements: Spouse/significant other;Other relatives Available Help at Discharge: Family;Available 24 hours/day Type of Home: Mobile home Home Access: Stairs to enter Entrance Stairs-Rails: Can reach both Entrance Stairs-Number of Steps: 5 Home Layout: One level Home Equipment: Cane - single point      Prior Function Level of Independence: Independent         Comments: pt reports intermittent difficulty with transfers at baseline otherwise independent     Hand Dominance   Dominant Hand: Right    Extremity/Trunk Assessment   Upper Extremity Assessment Upper Extremity Assessment: Defer to OT evaluation    Lower Extremity Assessment Lower Extremity Assessment: LLE deficits/detail LLE Deficits / Details: grossly 4-/5  Cervical / Trunk Assessment Cervical / Trunk Assessment: Normal  Communication   Communication: No difficulties  Cognition Arousal/Alertness: Awake/alert Behavior  During Therapy: WFL for tasks assessed/performed Overall Cognitive Status: Within Functional Limits for tasks assessed                                        General Comments General comments (skin integrity, edema, etc.): pt tachy in 110s during session, otherwise VSS    Exercises     Assessment/Plan    PT Assessment Patient needs continued PT services  PT Problem List Decreased strength;Decreased activity tolerance;Decreased balance;Decreased mobility;Cardiopulmonary status limiting activity       PT Treatment Interventions DME instruction;Gait training;Stair training;Functional mobility training;Therapeutic activities;Therapeutic exercise;Balance training;Neuromuscular re-education;Patient/family education    PT Goals (Current goals can be found in the Care Plan section)  Acute Rehab PT Goals Patient Stated Goal: to continue improving gait and balance PT Goal Formulation: With patient Time For Goal Achievement: 01/18/21 Potential to Achieve Goals: Good Additional Goals Additional Goal #1: Pt will score >19/24 on DGI to indicate a reduced falls risk    Frequency Min 4X/week   Barriers to discharge        Co-evaluation               AM-PAC PT "6 Clicks" Mobility  Outcome Measure Help needed turning from your back to your side while in a flat bed without using bedrails?: None Help needed moving from lying on your back to sitting on the side of a flat bed without using bedrails?: None Help needed moving to and from a bed to a chair (including a wheelchair)?: A Little Help needed standing up from a chair using your arms (e.g., wheelchair or bedside chair)?: A Little Help needed to walk in hospital room?: A Little Help needed climbing 3-5 steps with a railing? : A Little 6 Click Score: 20    End of Session   Activity Tolerance: Patient tolerated treatment well Patient left: in chair;with call bell/phone within reach Nurse Communication: Mobility  status PT Visit Diagnosis: Unsteadiness on feet (R26.81);Difficulty in walking, not elsewhere classified (R26.2);Hemiplegia and hemiparesis;Muscle weakness (generalized) (M62.81);Other symptoms and signs involving the nervous system (R29.898);Other abnormalities of gait and mobility (R26.89) Hemiplegia - Right/Left: Left Hemiplegia - dominant/non-dominant: Non-dominant Hemiplegia - caused by: Cerebral infarction    Time: 0902-0921 PT Time Calculation (min) (ACUTE ONLY): 19 min   Charges:   PT Evaluation $PT Eval Low Complexity: 1 Low          Zenaida Niece, PT, DPT Acute Rehabilitation Pager: [phone]   Zenaida Niece 01/04/2021, 9:40 AM

## 2021-01-05 ENCOUNTER — Inpatient Hospital Stay (HOSPITAL_COMMUNITY): Payer: Self-pay

## 2021-01-05 ENCOUNTER — Inpatient Hospital Stay (HOSPITAL_COMMUNITY): Payer: Self-pay | Admitting: Occupational Therapy

## 2021-01-05 LAB — CBC WITH DIFFERENTIAL/PLATELET
Abs Immature Granulocytes: 0.02 10*3/uL (ref 0.00–0.07)
Basophils Absolute: 0 10*3/uL (ref 0.0–0.1)
Basophils Relative: 0 %
Eosinophils Absolute: 0 10*3/uL (ref 0.0–0.5)
Eosinophils Relative: 0 %
HCT: 35.2 % — ABNORMAL LOW (ref 36.0–46.0)
Hemoglobin: 11.4 g/dL — ABNORMAL LOW (ref 12.0–15.0)
Immature Granulocytes: 0 %
Lymphocytes Relative: 18 %
Lymphs Abs: 1.1 10*3/uL (ref 0.7–4.0)
MCH: 28.8 pg (ref 26.0–34.0)
MCHC: 32.4 g/dL (ref 30.0–36.0)
MCV: 88.9 fL (ref 80.0–100.0)
Monocytes Absolute: 0.6 10*3/uL (ref 0.1–1.0)
Monocytes Relative: 9 %
Neutro Abs: 4.4 10*3/uL (ref 1.7–7.7)
Neutrophils Relative %: 73 %
Platelets: 138 10*3/uL — ABNORMAL LOW (ref 150–400)
RBC: 3.96 MIL/uL (ref 3.87–5.11)
RDW: 13.7 % (ref 11.5–15.5)
WBC: 6.1 10*3/uL (ref 4.0–10.5)
nRBC: 0 % (ref 0.0–0.2)

## 2021-01-05 LAB — C-REACTIVE PROTEIN: CRP: 14.4 mg/dL — ABNORMAL HIGH (ref <1.0)

## 2021-01-05 LAB — GLUCOSE, CAPILLARY
Glucose-Capillary: 143 mg/dL — ABNORMAL HIGH (ref 70–99)
Glucose-Capillary: 108 mg/dL — ABNORMAL HIGH (ref 70–99)
Glucose-Capillary: 251 mg/dL — ABNORMAL HIGH (ref 70–99)
Glucose-Capillary: 268 mg/dL — ABNORMAL HIGH (ref 70–99)

## 2021-01-05 LAB — URINE CULTURE: Culture: 60000 — AB

## 2021-01-05 LAB — COMPREHENSIVE METABOLIC PANEL
ALT: 23 U/L (ref 0–44)
AST: 27 U/L (ref 15–41)
Albumin: 2.5 g/dL — ABNORMAL LOW (ref 3.5–5.0)
Alkaline Phosphatase: 72 U/L (ref 38–126)
Anion gap: 10 (ref 5–15)
BUN: 29 mg/dL — ABNORMAL HIGH (ref 6–20)
CO2: 21 mmol/L — ABNORMAL LOW (ref 22–32)
Calcium: 9.1 mg/dL (ref 8.9–10.3)
Chloride: 112 mmol/L — ABNORMAL HIGH (ref 98–111)
Creatinine, Ser: 1.93 mg/dL — ABNORMAL HIGH (ref 0.44–1.00)
GFR, Estimated: 31 mL/min — ABNORMAL LOW (ref >60)
Glucose, Bld: 154 mg/dL — ABNORMAL HIGH (ref 70–99)
Potassium: 3.8 mmol/L (ref 3.5–5.1)
Sodium: 143 mmol/L (ref 135–145)
Total Bilirubin: 0.4 mg/dL (ref 0.3–1.2)
Total Protein: 6.2 g/dL — ABNORMAL LOW (ref 6.5–8.1)

## 2021-01-05 LAB — D-DIMER, QUANTITATIVE: D-Dimer, Quant: <0.27 ug/mL-FEU (ref 0.00–0.50)

## 2021-01-05 LAB — BRAIN NATRIURETIC PEPTIDE: B Natriuretic Peptide: 98 pg/mL (ref 0.0–100.0)

## 2021-01-05 LAB — LACTIC ACID, PLASMA: Lactic Acid, Venous: 0.7 mmol/L (ref 0.5–1.9)

## 2021-01-05 LAB — MAGNESIUM: Magnesium: 2.4 mg/dL (ref 1.7–2.4)

## 2021-01-05 MED ORDER — CARVEDILOL 6.25 MG PO TABS: 6.2500 mg | ORAL_TABLET | Freq: Two times a day (BID) | ORAL | Status: DC

## 2021-01-05 MED ORDER — CARVEDILOL 3.125 MG PO TABS
3.1250 mg | ORAL_TABLET | Freq: Once | ORAL | Status: AC
  Administered 2021-01-05: 3.125 mg via ORAL
  Filled 2021-01-05: qty 1

## 2021-01-05 MED ORDER — CARVEDILOL 6.25 MG PO TABS
6.2500 mg | ORAL_TABLET | Freq: Two times a day (BID) | ORAL | Status: DC
  Administered 2021-01-05 – 2021-01-06 (×2): 6.25 mg via ORAL
  Filled 2021-01-05 (×2): qty 1

## 2021-01-05 NOTE — Progress Notes (Addendum)
PROGRESS NOTE                                                                                                                                                                                                             Patient Demographics:    Adrienne Lane, is a 55 y.o. female, DOB - 02-25-1966, WTU:[id number]  Outpatient Primary MD for the patient is Nicolette Bang, DO    LOS - 1  Admit date - 01/03/2021    CC - Covid from SNF     Brief Narrative (HPI from H&P) - Adrienne Lane is a 55 y.o. female with medical history significant of CVA, diabetes, CKD 3, A. fib, constipation, CHF, hypertension, normocytic anemia, hyperlipidemia who is being evaluated for acute febrile illness for the past 2 days at [facility] inpatient rehab, she has so far taken 1 COVID-19 shot and was waiting for her second shot which was due soon, her work-up suggestive of COVID-19 infection and she was admitted to inpatient hospital from the rehab.   Subjective:   Patient in bed, appears comfortable, denies any headache, no fever, no chest pain or pressure, no shortness of breath , no abdominal pain. No focal weakness.    Assessment  & Plan :     1. Acute Covid 19 Viral infection in a patient with partial one-shot vaccination from mRNA vaccine few weeks ago - she far seems to have mild viral illness, no evidence of pulmonary involvement yet, chest x-ray stable, will place her on low-dose steroids and 3 days of remdesivir, doing well likely stop COVID meds on 01/06/2021.  She is agreeable to using Actemra if needed.  Encouraged the patient to sit up in chair in the daytime use I-S and flutter valve for pulmonary toiletry and then prone in bed when at night.  Will advance activity and titrate down oxygen as possible.  SpO2: 100 %  Recent Labs  Lab 01/02/21 2308 01/03/21 1714 01/04/21 0230 01/04/21 0804 01/05/21 0341  WBC 4.3  --  6.4  --  6.1   HGB 12.3  --  11.8*  --  11.4*  HCT 36.9  --  35.9*  --  35.2*  PLT 174  --  149*  --  138*  CRP  --   --   --  12.6* 14.4*  BNP  --   --   --  62.5 98.0  DDIMER  --   --   --  0.48 <0.27  AST  --   --   --   --  27  ALT  --   --   --   --  23  ALKPHOS  --   --   --   --  72  BILITOT  --   --   --   --  0.4  ALBUMIN  --   --   --   --  2.5*  LATICACIDVEN  --   --   --   --  0.7  SARSCOV2NAA  --  POSITIVE*  --   --   --     2. 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  Was in inpatient rehab, continue PT OT, continue Pradaxa, statin and aspirin combination for secondary prevention.  3. Obesity.  BMI 34.  Follow-up with PCP for weight loss post discharge.  4. Dyslipidemia.  On statin.  5.  Hypertension. Coreg dose adjusted for better control.  6. Paroxysmal atrial fibrillation [place] vas 2 score of greater than 4.  Continue Coreg and Pradaxa combination.  7. Asymptomatic PVCs.  EF under 50%, EKG nonacute, previous EKG also showing frequent PVCs, have maxed Coreg dose, electrolytes are stable.   8. CKD 3B.  Baseline creatinine close to 2.5.  Currently at baseline.    9. DM type II on Lantus and sliding scale, will monitor and adjust.  Lab Results  Component Value Date   HGBA1C 8.0 (H) 12/25/2020   CBG (last 3)  Recent Labs    01/04/21 1247 01/04/21 1645 01/05/21 0759  GLUCAP 243* 170* 143*        Condition - Fair  Family Communication  : husband Dareen [phone] on 01/04/2021, will inform patient wants to go home most likely on 01/06/2021.  Code Status :  Full  Consults  :  None  Procedures  :     PUD Prophylaxis :    Disposition Plan  :    Status is: Inpt  Dispo: The patient is from: SNF              Anticipated d/c is to: SNF              Anticipated d/c date is: 3 days              Patient currently is not medically stable to d/c.  DVT Prophylaxis  :  Dabigatran  Lab Results  Component Value Date   PLT 138 (L) 01/05/2021    Diet :  Diet  Order            Diet Heart Room service appropriate? Yes; Fluid consistency: Thin  Diet effective now                  Inpatient Medications  Scheduled Meds: . amLODipine  5 mg Oral Daily  . aspirin  81 mg Oral Daily  . atorvastatin  80 mg Oral QPM  . calcium carbonate  1 tablet Oral Q breakfast  . carvedilol  3.125 mg Oral BID WC  . dabigatran  150 mg Oral Q12H  . dapagliflozin propanediol  10 mg Oral Daily  . dexamethasone (DECADRON) injection  6 mg Intravenous Q24H  . feeding supplement (GLUCERNA SHAKE)  237 mL Oral BID BM  . hydrALAZINE  100 mg Oral Q8H  . insulin aspart  0-15 Units Subcutaneous TID WC  . insulin aspart  5 Units  Subcutaneous TID WC  . insulin glargine  20 Units Subcutaneous Q24H  . mexiletine  300 mg Oral Q12H  . multivitamin with minerals  1 tablet Oral Daily  . polyethylene glycol  17 g Oral BID  . senna-docusate  1 tablet Oral QHS  . sodium chloride flush  3 mL Intravenous Q12H   Continuous Infusions: . remdesivir 100 mg in NS 100 mL     PRN Meds:.lidocaine, phenol  Antibiotics  :    Anti-infectives (From admission, onward)   Start     Dose/Rate Route Frequency Ordered Stop   01/05/21 1000  remdesivir 100 mg in sodium chloride 0.9 % 100 mL IVPB       "Followed by" Linked Group Details   100 mg 200 mL/hr over 30 Minutes Intravenous Daily 01/04/21 1050 01/09/21 0959   01/04/21 1300  remdesivir 200 mg in sodium chloride 0.9% 250 mL IVPB       "Followed by" Linked Group Details   200 mg 580 mL/hr over 30 Minutes Intravenous Once 01/04/21 1050 01/04/21 1424   01/04/21 1000  vancomycin (VANCOCIN) IVPB 1000 mg/200 mL premix  Status:  Discontinued        1,000 mg 200 mL/hr over 60 Minutes Intravenous Every 24 hours 01/03/21 2114 01/03/21 2311   01/03/21 2300  piperacillin-tazobactam (ZOSYN) 3.375 g in dextrose 5 % 50 mL IVPB  Status:  Discontinued        3.375 g 100 mL/hr over 30 Minutes Intravenous Every 8 hours 01/03/21 1851 01/03/21 2311    01/03/21 2230  vancomycin (VANCOCIN) 1,000 mg in sodium chloride 0.9 % 500 mL IVPB  Status:  Discontinued        1,000 mg 250 mL/hr over 120 Minutes Intravenous Every 12 hours 01/03/21 1851 01/03/21 1932   01/03/21 2200  vancomycin (VANCOREADY) IVPB 1500 mg/300 mL  Status:  Discontinued        1,500 mg 150 mL/hr over 120 Minutes Intravenous  Once 01/03/21 2109 01/03/21 2114   01/03/21 2030  vancomycin (VANCOREADY) IVPB 2000 mg/400 mL  Status:  Discontinued        2,000 mg 200 mL/hr over 120 Minutes Intravenous  Once 01/03/21 1934 01/03/21 2109       Time Spent in minutes  30   Lala Lund M.D on 01/05/2021 at 9:47 AM  To page go to [email or web address]   [facility] -  Office  [phone]   See all Orders from today for further details    Objective:   Vitals:   01/04/21 0119 01/04/21 1649 01/04/21 2149 01/05/21 0512  BP: 130/70 (!) 149/79 (!) 145/87 (!) 142/70  Pulse: 82 95 75 70  Resp: 19 15 18 18   Temp: 99.6 F (37.6 C) 99.3 F (37.4 C) 98.4 F (36.9 C) 99 F (37.2 C)  TempSrc: Oral Oral Oral Oral  SpO2: 99% 100% 100% 100%    Wt Readings from Last 3 Encounters:  01/03/21 102.5 kg  12/25/20 101.6 kg  10/23/20 101.6 kg     Intake/Output Summary (Last 24 hours) at 01/05/2021 0947 Last data filed at 01/05/2021 0900 Gross per 24 hour  Intake 120 ml  Output --  Net 120 ml     Physical Exam  Awake Alert, mild left-sided weakness with facial droop [place].AT,PERRAL Supple Neck,No JVD, No cervical lymphadenopathy appriciated.  Symmetrical Chest wall movement, Good air movement bilaterally, CTAB RRR,No Gallops, Rubs or new Murmurs, No Parasternal Heave +ve B.Sounds, Abd Soft, No tenderness, No organomegaly appriciated, No rebound -  guarding or rigidity. No Cyanosis, Clubbing or edema, No new Rash or bruise     Data Review:    CBC Recent Labs  Lab 01/02/21 2308 01/04/21 0230 01/05/21 0341  WBC 4.3 6.4 6.1  HGB 12.3 11.8* 11.4*  HCT 36.9 35.9* 35.2*   PLT 174 149* 138*  MCV 86.6 86.7 88.9  MCH 28.9 28.5 28.8  MCHC 33.3 32.9 32.4  RDW 13.6 13.8 13.7  LYMPHSABS 0.5*  --  1.1  MONOABS 0.6  --  0.6  EOSABS 0.0  --  0.0  BASOSABS 0.0  --  0.0    Recent Labs  Lab 01/01/21 0507 01/04/21 0230 01/04/21 0804 01/05/21 0341  NA 142 141  --  143  K 4.0 3.5  --  3.8  CL 109 109  --  112*  CO2 21* 22  --  21*  GLUCOSE 98 202*  --  154*  BUN 33* 31*  --  29*  CREATININE 1.52* 2.23*  --  1.93*  CALCIUM 9.2 8.7*  --  9.1  AST  --   --   --  27  ALT  --   --   --  23  ALKPHOS  --   --   --  72  BILITOT  --   --   --  0.4  ALBUMIN  --   --   --  2.5*  MG  --   --  2.4 2.4  CRP  --   --  12.6* 14.4*  DDIMER  --   --  0.48 <0.27  LATICACIDVEN  --   --   --  0.7  BNP  --   --  62.5 98.0    ------------------------------------------------------------------------------------------------------------------ No results for input(s): CHOL, HDL, LDLCALC, TRIG, CHOLHDL, LDLDIRECT in the last 72 hours.  Lab Results  Component Value Date   HGBA1C 8.0 (H) 12/25/2020   ------------------------------------------------------------------------------------------------------------------ No results for input(s): TSH, T4TOTAL, T3FREE, THYROIDAB in the last 72 hours.  Invalid input(s): FREET3  Cardiac Enzymes No results for input(s): CKMB, TROPONINI, MYOGLOBIN in the last 168 hours.  Invalid input(s): CK ------------------------------------------------------------------------------------------------------------------    Component Value Date/Time   BNP 98.0 01/05/2021 0341    Micro Results Recent Results (from the past 240 hour(s))  Culture, Urine     Status: Abnormal   Collection Time: 01/02/21  5:34 AM   Specimen: Urine, Random  Result Value Ref Range Status   Specimen Description URINE, RANDOM  Final   Special Requests   Final    NONE Performed at [facility], [address], [place], [place] [address]    Culture 60,000  COLONIES/mL STAPHYLOCOCCUS EPIDERMIDIS (A)  Final   Report Status 01/05/2021 FINAL  Final   Organism ID, Bacteria STAPHYLOCOCCUS EPIDERMIDIS (A)  Final      Susceptibility   Staphylococcus epidermidis - MIC*    CIPROFLOXACIN 1 SENSITIVE Sensitive     GENTAMICIN 8 INTERMEDIATE Intermediate     NITROFURANTOIN <=16 SENSITIVE Sensitive     OXACILLIN <=0.25 SENSITIVE Sensitive     TETRACYCLINE >=16 RESISTANT Resistant     VANCOMYCIN 2 SENSITIVE Sensitive     TRIMETH/SULFA <=10 SENSITIVE Sensitive     CLINDAMYCIN RESISTANT Resistant     RIFAMPIN <=0.5 SENSITIVE Sensitive     Inducible Clindamycin POSITIVE Resistant     * 60,000 COLONIES/mL STAPHYLOCOCCUS EPIDERMIDIS  Culture, blood (routine x 2)     Status: None (Preliminary result)   Collection Time: 01/02/21 11:10 PM  Specimen: BLOOD  Result Value Ref Range Status   Specimen Description BLOOD LEFT ANTECUBITAL  Final   Special Requests   Final    BOTTLES DRAWN AEROBIC AND ANAEROBIC Blood Culture adequate volume   Culture   Final    NO GROWTH 1 DAY Performed at [facility], [address] [place], [place] [address]    Report Status PENDING  Incomplete  Culture, blood (routine x 2)     Status: None (Preliminary result)   Collection Time: 01/02/21 11:18 PM   Specimen: BLOOD LEFT HAND  Result Value Ref Range Status   Specimen Description BLOOD LEFT HAND  Final   Special Requests AEROBIC BOTTLE ONLY Blood Culture adequate volume  Final   Culture   Final    NO GROWTH 1 DAY Performed at [facility], [place] [address], [place], [place] [address]    Report Status PENDING  Incomplete  MRSA PCR Screening     Status: None   Collection Time: 01/03/21  2:06 PM   Specimen: Nasal Mucosa; Nasopharyngeal  Result Value Ref Range Status   MRSA by PCR NEGATIVE NEGATIVE Final    Comment:        The GeneXpert MRSA Assay (FDA approved for NASAL specimens only), is one component of a comprehensive MRSA colonization surveillance  program. It is not intended to diagnose MRSA infection nor to guide or monitor treatment for MRSA infections. Performed at [facility], [place] [address], [place], [place] [address]   SARS CORONAVIRUS 2 (TAT 6-24 HRS) Nasopharyngeal Nasopharyngeal Swab     Status: Abnormal   Collection Time: 01/03/21  5:14 PM   Specimen: Nasopharyngeal Swab  Result Value Ref Range Status   SARS Coronavirus 2 POSITIVE (A) NEGATIVE Final    Comment: (NOTE) SARS-CoV-2 target nucleic acids are DETECTED.  The SARS-CoV-2 RNA is generally detectable in upper and lower respiratory specimens during the acute phase of infection. Positive results are indicative of the presence of SARS-CoV-2 RNA. Clinical correlation with patient history and other diagnostic information is  necessary to determine patient infection status. Positive results do not rule out bacterial infection or co-infection with other viruses.  The expected result is Negative.  Fact Sheet for Patients: SugarRoll.be  Fact Sheet for Healthcare Providers: https://www.woods-mathews.com/  This test is not yet approved or cleared by the [place] FDA and  has been authorized for detection and/or diagnosis of SARS-CoV-2 by FDA under an Emergency Use Authorization (EUA). This EUA will remain  in effect (meaning this test can be used) for the duration of the COVID-19 declaration under Section 564(b)(1) of the Act, 21 U. S.C. section 360bbb-3(b)(1), unless the authorization is terminated or revoked sooner.   Performed at [facility], [place] [address] [place], [place] [address]     Radiology Reports CT HEAD WO CONTRAST  Result Date: 12/24/2020 CLINICAL DATA:  Neuro deficit, acute stroke suspected. Abnormal gait and aphasia stray. Today's having left-sided weakness. EXAM: CT HEAD WITHOUT CONTRAST TECHNIQUE: Contiguous axial images were obtained from the base of the skull through the vertex  without intravenous contrast. COMPARISON:  None. FINDINGS: Brain: Small area of hypoattenuation in the left periatrial white matter (see series 5, image 44 and series 3, image 15). Additional small area of hypoattenuation in the right corona radiata. No acute hemorrhage. No hydrocephalus. No mass lesion. No abnormal mass effect. No midline shift. No extra-axial fluid collection. Vascular: No hyperdense vessel identified. Calcific atherosclerosis. Skull: No acute fracture.  Punctate foreign body in the subcutaneous high posterior left scalp without soft tissue swelling. Sinuses/Orbits: No acute finding. Other: No mastoid effusions. IMPRESSION: 1. Small area of hypoattenuation in the left periatrial white matter could represent chronic microvascular ischemic disease or age indeterminate lacunar infarct. Additional age indeterminate lacunar infarct within the right corona radiata, favored remote. If there is concern for acute infarct, MRI could further evaluate. 2. No acute hemorrhage. 3. Punctate foreign body in the subcutaneous high posterior left scalp, likely remote given no scalp swelling. Electronically Signed   By: Margaretha Sheffield MD   On: 12/24/2020 12:30   MR ANGIO HEAD WO CONTRAST  Result Date: 12/24/2020 CLINICAL DATA:  Follow-up examination for acute stroke. EXAM: MRA HEAD WITHOUT CONTRAST TECHNIQUE: Angiographic images of the Circle of Willis were obtained using MRA technique without intravenous contrast. COMPARISON:  Prior MRI from earlier the same day. FINDINGS: ANTERIOR CIRCULATION: Visualized distal cervical segments of the internal carotid arteries are widely patent with symmetric antegrade flow. Petrous, cavernous, and supraclinoid segments widely patent without stenosis or other abnormality. A1 segments widely patent. Normal anterior communicating artery complex. Anterior cerebral arteries widely patent to their distal aspects. No M1 stenosis or occlusion. Normal MCA bifurcations. Distal MCA  branches well perfused and symmetric. POSTERIOR CIRCULATION: Both V4 segments widely patent to the vertebrobasilar junction without stenosis. Left vertebral artery dominant. Both PICA origins patent and normal. Basilar widely patent to its distal aspect. Superior cerebellar arteries patent bilaterally. Both PCAs supplied via the basilar as well as small bilateral posterior communicating arteries. PCAs well perfused to their distal aspects without stenosis. No intracranial aneurysm or other vascular abnormality. IMPRESSION: Normal intracranial MRA. No large vessel occlusion, hemodynamically significant stenosis, or other acute vascular abnormality. Electronically Signed   By: Jeannine Boga M.D.   On: 12/24/2020 22:40   MR BRAIN WO CONTRAST  Result Date: 12/24/2020 CLINICAL DATA:  Neuro deficit, acute stroke suspected. EXAM: MRI HEAD WITHOUT CONTRAST TECHNIQUE: Multiplanar, multiecho pulse sequences of the brain and surrounding structures were obtained without intravenous contrast. COMPARISON:  Same day head CT. FINDINGS: Brain: Acute infarct within the right hemi pons. Mild associated edema without substantial mass effect. Small acute infarct in the left frontal subcortical white matter (see series 2 and 250, image 29) without associated edema. Small remote lacunar infarcts in the right corona radiata and left periatrial white matter. No hydrocephalus. No acute hemorrhage. No mass lesion. No extra-axial fluid collection. Vascular: Major arterial flow voids are maintained at the skull base. Skull and upper cervical spine: Normal marrow signal. Sinuses/Orbits: Sinuses are clear.  Unremarkable orbits. IMPRESSION: 1. Acute infarct in the right hemipons. Additional small acute infarct in the left frontal subcortical white matter. 2. Mild associated edema in the right pons without substantial mass effect. 3. Small remote lacunar infarcts in the right corona radiata and left periatrial white matter. Findings  discussed with Dr. Shelly Coss via telephone at 2:02 PM. Electronically Signed   By: Margaretha Sheffield MD   On: 12/24/2020 14:05   DG Chest Port 1 View  Result Date: 01/05/2021 CLINICAL DATA:  55 year old female positive COVID-19. Shortness of breath. EXAM: PORTABLE CHEST 1 VIEW COMPARISON:  Portable chest 01/04/2021 and earlier. FINDINGS: Portable AP upright view at 0757 hours. Continued low lung volumes, although mildly improved from 01/03/2020. Mediastinal contours remain normal. Visualized tracheal air column is within normal limits. Allowing for portable technique the lungs are clear. No pneumothorax or pleural effusion. Paucity of bowel gas.  No acute osseous abnormality  identified. IMPRESSION: Negative portable chest. Electronically Signed   By: Genevie Ann M.D.   On: 01/05/2021 08:12   DG Chest Port 1 View  Result Date: 01/04/2021 CLINICAL DATA:  COVID positive, dyspnea EXAM: PORTABLE CHEST 1 VIEW COMPARISON:  01/02/2021 chest radiograph. FINDINGS: Stable cardiomediastinal silhouette with normal heart size. No pneumothorax. No pleural effusion. Lungs appear clear, with no acute consolidative airspace disease and no pulmonary edema. IMPRESSION: No active disease. Electronically Signed   By: Ilona Sorrel M.D.   On: 01/04/2021 08:34   DG CHEST PORT 1 VIEW  Result Date: 01/02/2021 CLINICAL DATA:  Fever EXAM: PORTABLE CHEST 1 VIEW COMPARISON:  02/22/2020 FINDINGS: Low lung volumes. No focal airspace disease or pleural effusion. No pneumothorax. Minimal atelectasis right base IMPRESSION: Low lung volumes with minimal atelectasis right base. Electronically Signed   By: Donavan Foil M.D.   On: 01/02/2021 20:23   ECHOCARDIOGRAM COMPLETE  Result Date: 12/25/2020    ECHOCARDIOGRAM REPORT   Patient Name:   DONICA DEROUIN Date of Exam: 12/25/2020 Medical Rec #:  [id number]      Height:       68.0 in Accession #:    [id number]     Weight:       224.0 lb Date of Birth:  December 26, 1965      BSA:          2.145 m Patient  Age:    71 years       BP:           137/71 mmHg Patient Gender: F              HR:           81 bpm. Exam Location:  Inpatient Procedure: 2D Echo, Color Doppler and Cardiac Doppler Indications:    Stroke i163.96  History:        Patient has prior history of Echocardiogram examinations, most                 recent 06/16/2020. CHF, Arrythmias:Atrial Fibrillation; Risk                 Factors:Hypertension, Diabetes and Dyslipidemia.  Sonographer:    Raquel Sarna Senior RDCS Referring Phys: [place]  1. Since the last study on 06/16/20 LVEF remains mildly decreased with diffuse hypokinesis and LVEF 45-50%. RVEF is mildly decreased, RV is now less dilated and RVSP has improved from 50 to 30 mmHg.  2. Left ventricular ejection fraction, by estimation, is 45 to 50%. The left ventricle has mildly decreased function. The left ventricle demonstrates global hypokinesis. Left ventricular diastolic function could not be evaluated.  3. Right ventricular systolic function is mildly reduced. The right ventricular size is normal. There is normal pulmonary artery systolic pressure. The estimated right ventricular systolic pressure is 02.4 mmHg.  4. Left atrial size was mildly dilated.  5. The mitral valve is normal in structure. Mild mitral valve regurgitation. No evidence of mitral stenosis.  6. The aortic valve is normal in structure. Aortic valve regurgitation is not visualized. No aortic stenosis is present.  7. The inferior vena cava is normal in size with <50% respiratory variability, suggesting right atrial pressure of 8 mmHg. FINDINGS  Left Ventricle: Left ventricular ejection fraction, by estimation, is 45 to 50%. The left ventricle has mildly decreased function. The left ventricle demonstrates global hypokinesis. The left ventricular internal cavity size was normal in size. There is  no left ventricular hypertrophy. Left ventricular diastolic function  could not be evaluated due to atrial fibrillation. Left  ventricular diastolic function could not be evaluated. Right Ventricle: The right ventricular size is normal. No increase in right ventricular wall thickness. Right ventricular systolic function is mildly reduced. There is normal pulmonary artery systolic pressure. The tricuspid regurgitant velocity is 2.35 m/s, and with an assumed right atrial pressure of 8 mmHg, the estimated right ventricular systolic pressure is 84.1 mmHg. Left Atrium: Left atrial size was mildly dilated. Right Atrium: Right atrial size was normal in size. Pericardium: There is no evidence of pericardial effusion. Mitral Valve: The mitral valve is normal in structure. Mild mitral valve regurgitation. No evidence of mitral valve stenosis. Tricuspid Valve: The tricuspid valve is normal in structure. Tricuspid valve regurgitation is mild . No evidence of tricuspid stenosis. Aortic Valve: The aortic valve is normal in structure. Aortic valve regurgitation is not visualized. No aortic stenosis is present. Pulmonic Valve: The pulmonic valve was normal in structure. Pulmonic valve regurgitation is not visualized. No evidence of pulmonic stenosis. Aorta: The aortic root is normal in size and structure. Venous: The inferior vena cava is normal in size with less than 50% respiratory variability, suggesting right atrial pressure of 8 mmHg. IAS/Shunts: No atrial level shunt detected by color flow Doppler.  LEFT VENTRICLE PLAX 2D LVIDd:         4.80 cm LVIDs:         4.20 cm LV PW:         1.20 cm LV IVS:        0.70 cm LVOT diam:     2.30 cm LV SV:         54 LV SV Index:   25 LVOT Area:     4.15 cm  LV Volumes (MOD) LV vol d, MOD A2C: 84.7 ml LV vol d, MOD A4C: 99.4 ml LV vol s, MOD A2C: 60.2 ml LV vol s, MOD A4C: 65.7 ml LV SV MOD A2C:     24.5 ml LV SV MOD A4C:     99.4 ml LV SV MOD BP:      31.4 ml RIGHT VENTRICLE RV S prime:     7.51 cm/s TAPSE (M-mode): 2.3 cm LEFT ATRIUM             Index       RIGHT ATRIUM           Index LA diam:        3.60 cm  1.68 cm/m  RA Area:     15.70 cm LA Vol (A2C):   83.1 ml 38.75 ml/m RA Volume:   41.60 ml  19.40 ml/m LA Vol (A4C):   62.1 ml 28.96 ml/m LA Biplane Vol: 71.2 ml 33.20 ml/m  AORTIC VALVE LVOT Vmax:   66.25 cm/s LVOT Vmean:  43.275 cm/s LVOT VTI:    0.129 m  AORTA Ao Root diam: 2.80 cm TRICUSPID VALVE TR Peak grad:   22.1 mmHg TR Vmax:        235.00 cm/s  SHUNTS Systemic VTI:  0.13 m Systemic Diam: 2.30 cm Ena Dawley MD Electronically signed by Ena Dawley MD Signature Date/Time: 12/25/2020/11:53:23 AM    Final    VAS US CAROTID  Result Date: 12/25/2020 Carotid Arterial Duplex Study Indications:   CVA and Left side weakness, left facial drrop, speech                disturbance. Risk Factors:  Hypertension, Diabetes, no history of smoking. Other Factors: CKD4,  CHF, AFIB, Obesity. Performing Technologist: Rogelia Rohrer  Examination Guidelines: A complete evaluation includes B-mode imaging, spectral Doppler, color Doppler, and power Doppler as needed of all accessible portions of each vessel. Bilateral testing is considered an integral part of a complete examination. Limited examinations for reoccurring indications may be performed as noted.  Right Carotid Findings: +----------+--------+--------+--------+------------------+--------+           PSV cm/sEDV cm/sStenosisPlaque DescriptionComments +----------+--------+--------+--------+------------------+--------+ CCA Prox  97      11                                         +----------+--------+--------+--------+------------------+--------+ CCA Distal78      28                                         +----------+--------+--------+--------+------------------+--------+ ICA Prox  65      16      1-39%                              +----------+--------+--------+--------+------------------+--------+ ICA Distal109     25                                         +----------+--------+--------+--------+------------------+--------+ ECA        89      0                                          +----------+--------+--------+--------+------------------+--------+ +----------+--------+-------+----------------+-------------------+           PSV cm/sEDV cmsDescribe        Arm Pressure (mmHG) +----------+--------+-------+----------------+-------------------+ [id number]     0      Multiphasic, WNL                    +----------+--------+-------+----------------+-------------------+ +---------+--------+--+--------+--+---------+ VertebralPSV cm/s61EDV cm/s13Antegrade +---------+--------+--+--------+--+---------+  Left Carotid Findings: +----------+--------+--------+--------+------------------+--------+           PSV cm/sEDV cm/sStenosisPlaque DescriptionComments +----------+--------+--------+--------+------------------+--------+ CCA Prox  107     15                                         +----------+--------+--------+--------+------------------+--------+ CCA Distal84      18                                         +----------+--------+--------+--------+------------------+--------+ ICA Prox  89      25      1-39%                              +----------+--------+--------+--------+------------------+--------+ ICA Distal93      28                                         +----------+--------+--------+--------+------------------+--------+  ECA       64      0                                          +----------+--------+--------+--------+------------------+--------+ +----------+--------+--------+----------------+-------------------+           PSV cm/sEDV cm/sDescribe        Arm Pressure (mmHG) +----------+--------+--------+----------------+-------------------+ [id number]      11      Multiphasic, WNL                    +----------+--------+--------+----------------+-------------------+ +---------+--------+--+--------+--+---------+ VertebralPSV cm/s63EDV cm/s19Antegrade  +---------+--------+--+--------+--+---------+   Summary: Right Carotid: Velocities in the right ICA are consistent with a 1-39% stenosis.                The extracranial vessels were near-normal with only minimal wall                thickening or plaque. Left Carotid: Velocities in the left ICA are consistent with a 1-39% stenosis.               The extracranial vessels were near-normal with only minimal wall               thickening or plaque. Vertebrals:  Bilateral vertebral arteries demonstrate antegrade flow. Subclavians: Normal flow hemodynamics were seen in bilateral subclavian              arteries. *See table(s) above for measurements and observations.     Preliminary

## 2021-01-05 NOTE — TOC Progression Note (Addendum)
Transition of Care [facility]) - Progression Note    Patient Details  Name: Adrienne Lane MRN: [id number] Date of Birth: 03/04/1966  Transition of Care [facility]) CM/SW Contact  Jacalyn Lefevre Edson Snowball, RN Phone Number: 01/05/2021, 12:32 PM  Clinical Narrative:     Scheduled follow up with Primary Care at [facility]. Can assist with discharge medications through [facility], she can use their pharmacy for prescriptions after discharge.    OT recommending 3 in 1 , will follow for possible oxygen needs and timing of OP PT/OT   1445 Spoke to patient discussed OP PT/OT vs HHPT/OT patient prefers OP PT/OT. Discussed [facility] on [employer], patient states she has transportation two times a week .  Patient will need 3 in1 ordered same through [facility].   Patient states daughter is aware of the discharge plan but still would like Dr Candiss Norse to call her. Secure chatted Dr Candiss Norse  Expected Discharge Plan: Home/Self Care    Expected Discharge Plan and Services Expected Discharge Plan: Home/Self Care   Discharge Planning Services: CM Consult   Living arrangements for the past 2 months: Single Family Home                   DME Agency: NA       HH Arranged: NA           Social Determinants of Health (SDOH) Interventions    Readmission Risk Interventions No flowsheet data found.

## 2021-01-05 NOTE — Progress Notes (Signed)
Physical Therapy Treatment Patient Details Name: Adrienne Lane MRN: [id number] DOB: 05-26-1966 Today's Date: 01/05/2021    History of Present Illness 55 y.o. female with medical history significant of CVA, diabetes, CKD 3, A. fib, constipation, CHF, hypertension, normocytic anemia, hyperlipidemia who is being evaluated for acute febrile illness for the past 2 days at [facility] inpatient rehab. Pt recently admitted for acute CVA 1/5-1/8. Pt found to be COVID+ upon admission.    PT Comments    Pt is up to walk with PT and no AD was needed, but is demonstrating some accommodations for the changes of fatigue and prev stroke.  Pt is motivated to work, did resisted strengthening of BLE's but fatigues RLE significantly to make gait a challenge.  Follow acutely for goals of PT and work toward her independent and  Safe performance of gait and transfers.  Her plan remains to work on deficits that affect these skills.  Follow Up Recommendations  Outpatient PT;Supervision - Intermittent     Equipment Recommendations  None recommended by PT    Recommendations for Other Services       Precautions / Restrictions Precautions Precautions: Fall Precaution Comments: L sided weakness Restrictions Weight Bearing Restrictions: No    Mobility  Bed Mobility Overal bed mobility: Modified Independent             General bed mobility comments: up in chair when PT arrived  Transfers Overall transfer level: Needs assistance Equipment used: None Transfers: Sit to/from Stand Sit to Stand: Min guard         General transfer comment: looked at various heights to stand, and untimately does the best from surfaces with more firm structure  Ambulation/Gait Ambulation/Gait assistance: Min guard Gait Distance (Feet): 120 Feet Assistive device: None Gait Pattern/deviations: Step-to pattern;Step-through pattern;Wide base of support;Decreased weight shift to left;Decreased stance time - left Gait velocity:  reduced Gait velocity interpretation: <1.8 ft/sec, indicate of risk for recurrent falls General Gait Details: pt is not using AD but widens base to walk and reduces stance on LLE to compensate   Stairs             Wheelchair Mobility    Modified Rankin (Stroke Patients Only) Modified Rankin (Stroke Patients Only) Pre-Morbid Rankin Score: No symptoms Modified Rankin: Moderately severe disability     Balance Overall balance assessment: Needs assistance Sitting-balance support: No upper extremity supported;Feet supported Sitting balance-Leahy Scale: Good     Standing balance support: No upper extremity supported Standing balance-Leahy Scale: Fair                              Cognition Arousal/Alertness: Awake/alert Behavior During Therapy: WFL for tasks assessed/performed Overall Cognitive Status: Within Functional Limits for tasks assessed                                        Exercises General Exercises - Lower Extremity Long Arc Quad: Strengthening;10 reps Heel Slides: Strengthening;10 reps Hip ABduction/ADduction: Strengthening;10 reps    General Comments General comments (skin integrity, edema, etc.): Noted O2 sat 95% and pulse 113 with gait      Pertinent Vitals/Pain Pain Assessment: No/denies pain    Home Living                      Prior Function  PT Goals (current goals can now be found in the care plan section) Acute Rehab PT Goals Patient Stated Goal: get stronger and increase safe gait    Frequency    Min 4X/week      PT Plan Current plan remains appropriate    Co-evaluation              AM-PAC PT "6 Clicks" Mobility   Outcome Measure  Help needed turning from your back to your side while in a flat bed without using bedrails?: None Help needed moving from lying on your back to sitting on the side of a flat bed without using bedrails?: None Help needed moving to and from a bed  to a chair (including a wheelchair)?: A Little Help needed standing up from a chair using your arms (e.g., wheelchair or bedside chair)?: A Little Help needed to walk in hospital room?: A Little Help needed climbing 3-5 steps with a railing? : A Little 6 Click Score: 20    End of Session Equipment Utilized During Treatment: Gait belt Activity Tolerance: Patient tolerated treatment well Patient left: in chair;with call bell/phone within reach Nurse Communication: Mobility status PT Visit Diagnosis: Unsteadiness on feet (R26.81);Difficulty in walking, not elsewhere classified (R26.2);Hemiplegia and hemiparesis;Muscle weakness (generalized) (M62.81);Other symptoms and signs involving the nervous system (R29.898);Other abnormalities of gait and mobility (R26.89) Hemiplegia - Right/Left: Left Hemiplegia - dominant/non-dominant: Non-dominant Hemiplegia - caused by: Cerebral infarction     Time: 9407-6808 PT Time Calculation (min) (ACUTE ONLY): 40 min  Charges:  $Gait Training: 8-22 mins $Therapeutic Exercise: 8-22 mins $Therapeutic Activity: 8-22 mins                   Ramond Dial 01/05/2021, 4:49 PM  Mee Hives, PT MS Acute Rehab Dept. Number: [facility]

## 2021-01-06 ENCOUNTER — Other Ambulatory Visit (HOSPITAL_COMMUNITY): Payer: Self-pay | Admitting: Internal Medicine

## 2021-01-06 LAB — D-DIMER, QUANTITATIVE: D-Dimer, Quant: 0.29 ug/mL-FEU (ref 0.00–0.50)

## 2021-01-06 LAB — CBC WITH DIFFERENTIAL/PLATELET
Abs Immature Granulocytes: 0.02 10*3/uL (ref 0.00–0.07)
Basophils Absolute: 0 10*3/uL (ref 0.0–0.1)
Basophils Relative: 0 %
Eosinophils Absolute: 0 10*3/uL (ref 0.0–0.5)
Eosinophils Relative: 0 %
HCT: 35.2 % — ABNORMAL LOW (ref 36.0–46.0)
Hemoglobin: 11.6 g/dL — ABNORMAL LOW (ref 12.0–15.0)
Immature Granulocytes: 0 %
Lymphocytes Relative: 17 %
Lymphs Abs: 0.9 10*3/uL (ref 0.7–4.0)
MCH: 29 pg (ref 26.0–34.0)
MCHC: 33 g/dL (ref 30.0–36.0)
MCV: 88 fL (ref 80.0–100.0)
Monocytes Absolute: 0.4 10*3/uL (ref 0.1–1.0)
Monocytes Relative: 8 %
Neutro Abs: 3.8 10*3/uL (ref 1.7–7.7)
Neutrophils Relative %: 75 %
Platelets: 154 10*3/uL (ref 150–400)
RBC: 4 MIL/uL (ref 3.87–5.11)
RDW: 13.6 % (ref 11.5–15.5)
WBC: 5.2 10*3/uL (ref 4.0–10.5)
nRBC: 0 % (ref 0.0–0.2)

## 2021-01-06 LAB — COMPREHENSIVE METABOLIC PANEL
ALT: 32 U/L (ref 0–44)
AST: 37 U/L (ref 15–41)
Albumin: 2.6 g/dL — ABNORMAL LOW (ref 3.5–5.0)
Alkaline Phosphatase: 70 U/L (ref 38–126)
Anion gap: 11 (ref 5–15)
BUN: 36 mg/dL — ABNORMAL HIGH (ref 6–20)
CO2: 19 mmol/L — ABNORMAL LOW (ref 22–32)
Calcium: 8.8 mg/dL — ABNORMAL LOW (ref 8.9–10.3)
Chloride: 109 mmol/L (ref 98–111)
Creatinine, Ser: 1.87 mg/dL — ABNORMAL HIGH (ref 0.44–1.00)
GFR, Estimated: 32 mL/min — ABNORMAL LOW (ref >60)
Glucose, Bld: 210 mg/dL — ABNORMAL HIGH (ref 70–99)
Potassium: 3.9 mmol/L (ref 3.5–5.1)
Sodium: 139 mmol/L (ref 135–145)
Total Bilirubin: 0.5 mg/dL (ref 0.3–1.2)
Total Protein: 6.3 g/dL — ABNORMAL LOW (ref 6.5–8.1)

## 2021-01-06 LAB — C-REACTIVE PROTEIN: CRP: 8.2 mg/dL — ABNORMAL HIGH (ref <1.0)

## 2021-01-06 LAB — BRAIN NATRIURETIC PEPTIDE: B Natriuretic Peptide: 170.9 pg/mL — ABNORMAL HIGH (ref 0.0–100.0)

## 2021-01-06 LAB — MAGNESIUM: Magnesium: 2.3 mg/dL (ref 1.7–2.4)

## 2021-01-06 LAB — GLUCOSE, CAPILLARY
Glucose-Capillary: 120 mg/dL — ABNORMAL HIGH (ref 70–99)
Glucose-Capillary: 177 mg/dL — ABNORMAL HIGH (ref 70–99)

## 2021-01-06 MED ORDER — INSULIN ASPART 100 UNIT/ML ~~LOC~~ SOLN: SUBCUTANEOUS | 0 refills | Status: DC

## 2021-01-06 MED ORDER — AMLODIPINE BESYLATE 5 MG PO TABS
5.0000 mg | ORAL_TABLET | Freq: Every day | ORAL | Status: DC
  Administered 2021-01-06: 5 mg via ORAL
  Filled 2021-01-06: qty 1

## 2021-01-06 MED ORDER — AMLODIPINE BESYLATE 10 MG PO TABS: 10.0000 mg | ORAL_TABLET | Freq: Every day | ORAL | 0 refills | Status: DC

## 2021-01-06 MED ORDER — AMLODIPINE BESYLATE 10 MG PO TABS: 10.0000 mg | ORAL_TABLET | Freq: Every day | ORAL | Status: DC

## 2021-01-06 MED ORDER — DABIGATRAN ETEXILATE MESYLATE 150 MG PO CAPS: 150.0000 mg | ORAL_CAPSULE | Freq: Two times a day (BID) | ORAL | 0 refills | Status: DC

## 2021-01-06 MED ORDER — CARVEDILOL 6.25 MG PO TABS: 6.2500 mg | ORAL_TABLET | Freq: Two times a day (BID) | ORAL | 0 refills | Status: DC

## 2021-01-06 MED FILL — AMLODIPINE BESYLATE 10 MG T: 10 | 30 days supply | Qty: 30 | Fill #0

## 2021-01-06 MED FILL — NovoLOG 100 UNIT/ML SOLN: 100 | 30 days supply | Qty: 10 | Fill #0

## 2021-01-06 MED FILL — ULTICARE INS 0.3 ML 30GX1/2: 30G X 1/2" | 30 days supply | Qty: 90 | Fill #0

## 2021-01-06 MED FILL — PRADAXA 150 MG CAP: 150 | 30 days supply | Qty: 60 | Fill #0

## 2021-01-06 MED FILL — CARVEDILOL 6.25 MG TABLET: 6.25 | 30 days supply | Qty: 60 | Fill #0

## 2021-01-06 NOTE — Discharge Instructions (Signed)
Follow with Primary MD Nicolette Bang, DO in 7 days   Get CBC, CMP, 2 view Chest X ray -  checked next visit within 1 week by Primary MD    Activity: As tolerated with Full fall precautions use walker/cane & assistance as needed  Disposition Home    Diet: Heart Healthy Low Carb.  Accuchecks 4 times/day, Once in AM empty stomach and then before each meal. Log in all results and show them to your Prim.MD in 3 days. If any glucose reading is under 80 or above 300 call your Prim MD immidiately. Follow Low glucose instructions for glucose under 80 as instructed.  Special Instructions: If you have smoked or chewed Tobacco  in the last 2 yrs please stop smoking, stop any regular Alcohol  and or any Recreational drug use.  On your next visit with your primary care physician please Get Medicines reviewed and adjusted.  Please request your Prim.MD to go over all Hospital Tests and Procedure/Radiological results at the follow up, please get all Hospital records sent to your Prim MD by signing hospital release before you go home.  If you experience worsening of your admission symptoms, develop shortness of breath, life threatening emergency, suicidal or homicidal thoughts you must seek medical attention immediately by calling 911 or calling your MD immediately  if symptoms less severe.  You Must read complete instructions/literature along with all the possible adverse reactions/side effects for all the Medicines you take and that have been prescribed to you. Take any new Medicines after you have completely understood and accpet all the possible adverse reactions/side effects.       Person Under Monitoring Name: Adrienne Lane  Location: [address] [address] [place] [address]   Infection Prevention Recommendations for Individuals Confirmed to have, or Being Evaluated for, 2019 Novel Coronavirus (COVID-19) Infection Who Receive Care at Home  Individuals who are confirmed to have, or are  being evaluated for, COVID-19 should follow the prevention steps below until a healthcare provider or local or state health department says they can return to normal activities.  Stay home except to get medical care You should restrict activities outside your home, except for getting medical care. Do not go to work, school, or public areas, and do not use public transportation or taxis.  Call ahead before visiting your doctor Before your medical appointment, call the healthcare provider and tell them that you have, or are being evaluated for, COVID-19 infection. This will help the healthcare provider's office take steps to keep other people from getting infected. Ask your healthcare provider to call the local or state health department.  Monitor your symptoms Seek prompt medical attention if your illness is worsening (e.g., difficulty breathing). Before going to your medical appointment, call the healthcare provider and tell them that you have, or are being evaluated for, COVID-19 infection. Ask your healthcare provider to call the local or state health department.  Wear a facemask You should wear a facemask that covers your nose and mouth when you are in the same room with other people and when you visit a healthcare provider. People who live with or visit you should also wear a facemask while they are in the same room with you.  Separate yourself from other people in your home As much as possible, you should stay in a different room from other people in your home. Also, you should use a separate bathroom, if available.  Avoid sharing household items You should not share dishes, drinking  glasses, cups, eating utensils, towels, bedding, or other items with other people in your home. After using these items, you should wash them thoroughly with soap and water.  Cover your coughs and sneezes Cover your mouth and nose with a tissue when you cough or sneeze, or you can cough or sneeze into  your sleeve. Throw used tissues in a lined trash can, and immediately wash your hands with soap and water for at least 20 seconds or use an alcohol-based hand rub.  Wash your [facility] your hands often and thoroughly with soap and water for at least 20 seconds. You can use an alcohol-based hand sanitizer if soap and water are not available and if your hands are not visibly dirty. Avoid touching your eyes, nose, and mouth with unwashed hands.   Prevention Steps for Caregivers and Household Members of Individuals Confirmed to have, or Being Evaluated for, COVID-19 Infection Being Cared for in the Home  If you live with, or provide care at home for, a person confirmed to have, or being evaluated for, COVID-19 infection please follow these guidelines to prevent infection:  Follow healthcare provider's instructions Make sure that you understand and can help the patient follow any healthcare provider instructions for all care.  Provide for the patient's basic needs You should help the patient with basic needs in the home and provide support for getting groceries, prescriptions, and other personal needs.  Monitor the patient's symptoms If they are getting sicker, call his or her medical provider and tell them that the patient has, or is being evaluated for, COVID-19 infection. This will help the healthcare provider's office take steps to keep other people from getting infected. Ask the healthcare provider to call the local or state health department.  Limit the number of people who have contact with the patient  If possible, have only one caregiver for the patient.  Other household members should stay in another home or place of residence. If this is not possible, they should stay  in another room, or be separated from the patient as much as possible. Use a separate bathroom, if available.  Restrict visitors who do not have an essential need to be in the home.  Keep older adults, very  young children, and other sick people away from the patient Keep older adults, very young children, and those who have compromised immune systems or chronic health conditions away from the patient. This includes people with chronic heart, lung, or kidney conditions, diabetes, and cancer.  Ensure good ventilation Make sure that shared spaces in the home have good air flow, such as from an air conditioner or an opened window, weather permitting.  Wash your hands often  Wash your hands often and thoroughly with soap and water for at least 20 seconds. You can use an alcohol based hand sanitizer if soap and water are not available and if your hands are not visibly dirty.  Avoid touching your eyes, nose, and mouth with unwashed hands.  Use disposable paper towels to dry your hands. If not available, use dedicated cloth towels and replace them when they become wet.  Wear a facemask and gloves  Wear a disposable facemask at all times in the room and gloves when you touch or have contact with the patient's blood, body fluids, and/or secretions or excretions, such as sweat, saliva, sputum, nasal mucus, vomit, urine, or feces.  Ensure the mask fits over your nose and mouth tightly, and do not touch it during use.  Throw out disposable facemasks and gloves after using them. Do not reuse.  Wash your hands immediately after removing your facemask and gloves.  If your personal clothing becomes contaminated, carefully remove clothing and launder. Wash your hands after handling contaminated clothing.  Place all used disposable facemasks, gloves, and other waste in a lined container before disposing them with other household waste.  Remove gloves and wash your hands immediately after handling these items.  Do not share dishes, glasses, or other household items with the patient  Avoid sharing household items. You should not share dishes, drinking glasses, cups, eating utensils, towels, bedding, or other  items with a patient who is confirmed to have, or being evaluated for, COVID-19 infection.  After the person uses these items, you should wash them thoroughly with soap and water.  Wash laundry thoroughly  Immediately remove and wash clothes or bedding that have blood, body fluids, and/or secretions or excretions, such as sweat, saliva, sputum, nasal mucus, vomit, urine, or feces, on them.  Wear gloves when handling laundry from the patient.  Read and follow directions on labels of laundry or clothing items and detergent. In general, wash and dry with the warmest temperatures recommended on the label.  Clean all areas the individual has used often  Clean all touchable surfaces, such as counters, tabletops, doorknobs, bathroom fixtures, toilets, phones, keyboards, tablets, and bedside tables, every day. Also, clean any surfaces that may have blood, body fluids, and/or secretions or excretions on them.  Wear gloves when cleaning surfaces the patient has come in contact with.  Use a diluted bleach solution (e.g., dilute bleach with 1 part bleach and 10 parts water) or a household disinfectant with a label that says EPA-registered for coronaviruses. To make a bleach solution at home, add 1 tablespoon of bleach to 1 quart (4 cups) of water. For a larger supply, add  cup of bleach to 1 gallon (16 cups) of water.  Read labels of cleaning products and follow recommendations provided on product labels. Labels contain instructions for safe and effective use of the cleaning product including precautions you should take when applying the product, such as wearing gloves or eye protection and making sure you have good ventilation during use of the product.  Remove gloves and wash hands immediately after cleaning.  Monitor yourself for signs and symptoms of illness Caregivers and household members are considered close contacts, should monitor their health, and will be asked to limit movement outside of  the home to the extent possible. Follow the monitoring steps for close contacts listed on the symptom monitoring form.   ? If you have additional questions, contact your local health department or call the epidemiologist on call at [phone] (available 24/7). ? This guidance is subject to change. For the most up-to-date guidance from [facility], please refer to their website: YouBlogs.pl   Information on my medicine - Pradaxa (dabigatran)  This medication education was reviewed with me or my healthcare representative as part of my discharge preparation.    Why was Pradaxa prescribed for you? Pradaxa was prescribed for you to reduce the risk of forming blood clots that cause a stroke if you have a medical condition called atrial fibrillation (a type of irregular heartbeat).    What do you Need to know about PradAXa? Take your Pradaxa TWICE DAILY - one capsule in the morning and one tablet in the evening with or without food.  It would be best to take the doses about the same time each  day.  The capsules should not be broken, chewed or opened - they must be swallowed whole.  Do not store Pradaxa in other medication containers - once the bottle is opened the Pradaxa should be used within FOUR months; throw away any capsules that haven't been by that time.  Take Pradaxa exactly as prescribed by your doctor.  DO NOT stop taking Pradaxa without talking to the doctor who prescribed the medication.  Stopping without other stroke prevention medication to take the place of Pradaxa may increase your risk of developing a clot that causes a stroke.  Refill your prescription before you run out.  After discharge, you should have regular check-up appointments with your healthcare provider that is prescribing your Pradaxa.  In the future your dose may need to be changed if your kidney function or weight changes by a significant  amount.  What do you do if you miss a dose? If you miss a dose, take it as soon as you remember on the same day.  If your next dose is less than 6 hours away, skip the missed dose.  Do not take two doses of PRADAXA at the same time.  Important Safety Information A possible side effect of Pradaxa is bleeding. You should call your healthcare provider right away if you experience any of the following: ? Bleeding from an injury or your nose that does not stop. ? Unusual colored urine (red or dark brown) or unusual colored stools (red or black). ? Unusual bruising for unknown reasons. ? A serious fall or if you hit your head (even if there is no bleeding).  Some medicines may interact with Pradaxa and might increase your risk of bleeding or clotting while on Pradaxa. To help avoid this, consult your healthcare provider or pharmacist prior to using any new prescription or non-prescription medications, including herbals, vitamins, non-steroidal anti-inflammatory drugs (NSAIDs) and supplements.  This website has more information on Pradaxa (dabigatran): [email or web address]://www.pradaxa.com

## 2021-01-06 NOTE — Care Management (Signed)
Prescriptions sent to [place] and filled through [facility] program with no co pays.   Magdalen Spatz RN

## 2021-01-06 NOTE — Progress Notes (Signed)
Occupational Therapy Treatment Patient Details Name: Adrienne Lane MRN: [id number] DOB: 1966/12/04 Today's Date: 01/06/2021    History of present illness 55 y.o. female with medical history significant of CVA, diabetes, CKD 3, A. fib, constipation, CHF, hypertension, normocytic anemia, hyperlipidemia who is being evaluated for acute febrile illness for the past 2 days at [facility] inpatient rehab. Pt recently admitted for acute CVA 1/5-1/8. Pt found to be COVID+ upon admission.   OT comments  Pt dressed and ready for DC home this afternoon. Per staff, pt able to gather items and pack without assistance. Limited session due to prep for DC. Provided UE HEP with resistive bands to assist in regaining strength of L UE until pt out of quarantine period and able to begin OP therapy. Pt noted with use of L hand during bimanual tasks though reports decreased control of L UE. Pt already has theraputty that she has been using for hand strength and fine motor coordination.   Follow Up Recommendations  Outpatient OT;Supervision - Intermittent    Equipment Recommendations  3 in 1 bedside commode    Recommendations for Other Services      Precautions / Restrictions Precautions Precautions: Fall Precaution Comments: L sided weakness Restrictions Weight Bearing Restrictions: No       Mobility Bed Mobility               General bed mobility comments: in recliner on OT entry  Transfers                      Balance Overall balance assessment: Needs assistance Sitting-balance support: No upper extremity supported;Feet supported Sitting balance-Leahy Scale: Good                                     ADL either performed or assessed with clinical judgement   ADL                                         General ADL Comments: Per staff, pt gathering items and packing for discharge without assistance     Vision   Vision Assessment?: No apparent  visual deficits   Perception     Praxis      Cognition Arousal/Alertness: Awake/alert Behavior During Therapy: WFL for tasks assessed/performed Overall Cognitive Status: Within Functional Limits for tasks assessed                                          Exercises     Shoulder Instructions       General Comments Pt on RA, dressed and ready for DC. Pt observed to be using L hand to assist with tasks, opening bag of medicines and holding phone though pt reports decreased control of L hand. Provided UE HEP with yellow and orange theraband. Encouraged pt to tie band to sturdy object if she finds difficulty anchoring with L hand.    Pertinent Vitals/ Pain       Pain Assessment: No/denies pain  Home Living  Prior Functioning/Environment              Frequency  Min 2X/week        Progress Toward Goals  OT Goals(current goals can now be found in the care plan section)  Progress towards OT goals: Progressing toward goals  Acute Rehab OT Goals Patient Stated Goal: get stronger and increase safe gait OT Goal Formulation: With patient Time For Goal Achievement: 01/18/21 Potential to Achieve Goals: Good ADL Goals Pt Will Perform Grooming: with modified independence;standing Pt Will Perform Lower Body Dressing: with modified independence;sit to/from stand Pt Will Transfer to Toilet: with modified independence;ambulating Pt/caregiver will Perform Home Exercise Program: Increased strength;Left upper extremity;With theraputty;With theraband;With Supervision  Plan Discharge plan remains appropriate    Co-evaluation                 AM-PAC OT "6 Clicks" Daily Activity     Outcome Measure   Help from another person eating meals?: None Help from another person taking care of personal grooming?: None Help from another person toileting, which includes using toliet, bedpan, or urinal?: A  Little Help from another person bathing (including washing, rinsing, drying)?: A Little Help from another person to put on and taking off regular upper body clothing?: None Help from another person to put on and taking off regular lower body clothing?: A Little 6 Click Score: 21    End of Session    OT Visit Diagnosis: Unsteadiness on feet (R26.81);Muscle weakness (generalized) (M62.81);Feeding difficulties (R63.3);Hemiplegia and hemiparesis Hemiplegia - Right/Left: Left Hemiplegia - dominant/non-dominant: Non-Dominant Hemiplegia - caused by: Cerebral infarction   Activity Tolerance Patient tolerated treatment well   Patient Left in chair;with nursing/sitter in room   Nurse Communication          Time: [id number] OT Time Calculation (min): 10 min  Charges: OT General Charges $OT Visit: 1 Visit OT Treatments $Therapeutic Exercise: 8-22 mins  Layla Maw, OTR/L   Layla Maw 01/06/2021, 2:55 PM

## 2021-01-06 NOTE — Discharge Summary (Addendum)
Adrienne Lane WNU:[id number] DOB: 1966/03/06 DOA: 01/03/2021  PCP: Nicolette Bang, DO  Admit date: 01/03/2021  Discharge date: 01/06/2021  Admitted From: [facility]   Disposition:  Home   Recommendations for Outpatient Follow-up:   Follow up with PCP in 1-2 weeks  PCP Please obtain BMP/CBC, 2 view CXR in 1week,  (see Discharge instructions)   PCP Please follow up on the following pending results: Monitor blood pressure, A1c, LDL, CBC and CMP along with two-view chest x-ray in 7 to 10 days.   Home Health: Outpatient PT and rehab Equipment/Devices: As below Consultations: None  Discharge Condition: Stable   CODE STATUS: Full  Diet Recommendation: Heart Healthy low carbohydrate  CC - fever   Brief history of present illness from the day of admission and additional interim summary    Adrienne Lane a 55 y.o.femalewith medical history significant ofCVA, diabetes, CKD 3, A. fib, constipation, CHF, hypertension, normocytic anemia, hyperlipidemia who is being evaluated for acute febrile illness for the past 2 days at [facility] inpatient rehab, she has so far taken 1 COVID-19 shot and was waiting for her second shot which was due soon, her work-up suggestive of COVID-19 infection and she was admitted to inpatient hospital from the rehab.                                                                 Hospital Course     1. Acute Covid 19 Viral infection in a patient with partial one-shot vaccination from mRNA vaccine few weeks ago - she far seems to have mild viral illness, no evidence of pulmonary involvement yet, chest x-ray was stable, she received 3 doses of steroids and Remdesivir, remains asymptomatic on room air, stable inflammatory markers with downtrend, clinically stable, will discharge home with outpatient  PCP follow-up, finish outpatient vaccinations per PCP.  Note all her family members living in her household are currently COVID-positive with relatively mild symptoms according to the patient.  Will be discharged home, husband and daughter have been told about the plan, they had some reservations of the whole household being COVID-positive and with patient coming home with COVID infection and they were told that all have likely the same infection and do not need to quarantine amongst themselves, if there is family number or friend who is COVID-negative then definitely they need to quarantine from them.  Note patient wondered if she could go back to CIR unfortunately according to see our policy patient at this time will not go to CIR unless she has a negative test which may take another 14 to 21 days, patient for now wishes to do outpatient PT and rehab, this plan was discussed by me with her multiple times, she completely understands the plan.  She also discussed this with case management on 01/05/2021 evening.  Note daughter was very upset that patient is coming home despite being COVID-19 positive, however all family members themselves at this time COVID-positive.  I tried to explain her the best I could.  She had told the nurse Elon Jester on 01/05/21  that she will sue [facility] for patient getting COVID-positive and being sent home with COVID infection.  SpO2: 100 %   2. 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  Was in inpatient rehab, continue PT OT outpatient, continue Pradaxa, statin and aspirin combination for secondary prevention.  Follow-up with PCP and neurology for secondary risk factor modulation.  Overall according to the patient her weakness is much improved.  3. Obesity.  BMI 34.  Follow-up with PCP for weight loss post discharge.  4. Dyslipidemia.  On statin.  5.  Hypertension.   On Coreg, Imdur, Norvasc, dose adjusted for better control PCP to  monitor.  6. Paroxysmal atrial fibrillation [place] vas 2 score of greater than 4.  Continue Coreg and Pradaxa combination.  7. Asymptomatic PVCs.  EF under 50%, EKG nonacute, previous EKG also showing frequent PVCs, have increased Coreg dose, electrolytes are stable.   8. CKD 3B.  Baseline creatinine close to 2.5.  Currently at baseline.    9. DM type II on Lantus have added sliding scale upon discharge.  Follow CBGs and A1c with PCP.  Lab Results  Component Value Date   HGBA1C 8.0 (H) 12/25/2020     Discharge diagnosis     Principal Problem:   Febrile illness, acute Active Problems:   CHF (congestive heart failure) (HCC)   Type 2 diabetes mellitus (HCC)   Essential hypertension   Frequent PVCs   Hyperlipidemia   Acute CVA (cerebrovascular accident) ([place])   Poorly controlled type 2 diabetes mellitus with peripheral neuropathy (HCC)   Stage 3b chronic kidney disease (HCC)   PAF (paroxysmal atrial fibrillation) ([place])   Irregular cardiac rhythm    Discharge instructions    Discharge Instructions    Ambulatory referral to Occupational Therapy   Complete by: As directed    Ambulatory referral to Physical Therapy   Complete by: As directed    Iontophoresis - 4 mg/ml of dexamethasone: No   T.E.N.S. Unit Evaluation and Dispense as Indicated: No   Discharge instructions   Complete by: As directed    Follow with Primary MD Nicolette Bang, DO in 7 days   Get CBC, CMP, 2 view Chest X ray -  checked next visit within 1 week by Primary MD    Activity: As tolerated with Full fall precautions use walker/cane & assistance as needed  Disposition Home    Diet: Heart Healthy Low Carb.  Accuchecks 4 times/day, Once in AM empty stomach and then before each meal. Log in all results and show them to your Prim.MD in 3 days. If any glucose reading is under 80 or above 300 call your Prim MD immidiately. Follow Low glucose instructions for glucose under 80 as  instructed.  Special Instructions: If you have smoked or chewed Tobacco  in the last 2 yrs please stop smoking, stop any regular Alcohol  and or any Recreational drug use.  On your next visit with your primary care physician please Get Medicines reviewed and adjusted.  Please request your Prim.MD to go over all Hospital Tests and Procedure/Radiological results at the follow up, please get all Hospital records sent to your Prim MD by signing hospital release before you go home.  If you  experience worsening of your admission symptoms, develop shortness of breath, life threatening emergency, suicidal or homicidal thoughts you must seek medical attention immediately by calling 911 or calling your MD immediately  if symptoms less severe.  You Must read complete instructions/literature along with all the possible adverse reactions/side effects for all the Medicines you take and that have been prescribed to you. Take any new Medicines after you have completely understood and accpet all the possible adverse reactions/side effects.   Increase activity slowly   Complete by: As directed    No wound care   Complete by: As directed       Discharge Medications   Allergies as of 01/06/2021      Reactions   Adhesive [tape]    Tears skin, can tolerate paper tape      Medication List    TAKE these medications   amLODipine 10 MG tablet Commonly known as: NORVASC Take 1 tablet (10 mg total) by mouth daily. What changed:   medication strength  how much to take   aspirin 81 MG chewable tablet Chew 1 tablet (81 mg total) by mouth daily.   atorvastatin 80 MG tablet Commonly known as: LIPITOR Take 1 tablet (80 mg total) by mouth every evening.   carvedilol 6.25 MG tablet Commonly known as: COREG Take 1 tablet (6.25 mg total) by mouth 2 (two) times daily with a meal. What changed:   medication strength  how much to take   dabigatran 150 MG Caps capsule Commonly known as: PRADAXA Take 1  capsule (150 mg total) by mouth every 12 (twelve) hours.   dapagliflozin propanediol 10 MG Tabs tablet Commonly known as: Farxiga Take 1 tablet (10 mg total) by mouth daily before breakfast.   glucose blood test strip Use as instructed   hydrALAZINE 100 MG tablet Commonly known as: APRESOLINE Take 0.5 tablets (50 mg total) by mouth 3 (three) times daily.   insulin aspart 100 UNIT/ML injection Commonly known as: NovoLOG Substitute to any brand approved.Before each meal 3 times a day, 140-199 - 2 units, 200-250 - 4 units, 251-299 - 6 units,  300-349 - 8 units,  350 or above 10 units. Dispense syringes and needles as needed, Ok to switch to PEN if approved. DX DM2, Code E11.65   insulin glargine 100 UNIT/ML injection Commonly known as: LANTUS Inject 0.2 mLs (20 Units total) into the skin every morning.   isosorbide mononitrate 60 MG 24 hr tablet Commonly known as: IMDUR Take 60 mg by mouth daily.   mexiletine 150 MG capsule Commonly known as: MEXITIL TAKE 2 CAPSULES (300 MG TOTAL) BY MOUTH EVERY 12 (TWELVE) HOURS.   onetouch ultrasoft lancets Check CBG twice a day   Oscal 500/200 D-3 500-200 MG-UNIT tablet Generic drug: calcium-vitamin D Take 1 tablet by mouth daily with breakfast.   potassium chloride 10 MEQ tablet Commonly known as: KLOR-CON Take 10 mEq by mouth daily.   TRUEplus Insulin Syringe 30G X 5/16" 0.5 ML Misc Generic drug: Insulin Syringe-Needle U-100 USE TO INJECT 12 UNITS AT BEDTIME.   WOMENS MULTI PO Take 1 tablet by mouth daily.            Durable Medical Equipment  (From admission, onward)         Start     Ordered   01/05/21 1459  For home use only DME 3 n 1  Once        01/05/21 1458   01/05/21 1236  For home use only  DME 3 n 1  Once        01/05/21 1235           Follow-up Information    [facility] Follow [facility].   Why: Dr Juleen China 02/08/21 at [place] information: [address] [place] [address]       [facility] Follow up.   Specialty: Rehabilitation Contact information: [address] [place] [id number] [place] [address] [phone]       Nicolette Bang, DO. Schedule an appointment as soon as possible for a visit in 1 week(s).   Specialty: Family Medicine Contact information: [place] [address] [phone]        Donato Heinz, MD .   Specialties: Cardiology, Radiology Contact information: [address] [place] [address] [place] [address] [phone]        [facility]. Schedule an appointment as soon as possible for a visit in 1 week(s).   Why: [facility] Contact information: [address]     [place] [address] [phone]              Major procedures and Radiology Reports - PLEASE review detailed and final reports thoroughly  -       CT HEAD WO CONTRAST  Result Date: 12/24/2020 CLINICAL DATA:  Neuro deficit, acute stroke suspected. Abnormal gait and aphasia stray. Today's having left-sided weakness. EXAM: CT HEAD WITHOUT CONTRAST TECHNIQUE: Contiguous axial images were obtained from the base of the skull through the vertex without intravenous contrast. COMPARISON:  None. FINDINGS: Brain: Small area of hypoattenuation in the left periatrial white matter (see series 5, image 44 and series 3, image 15). Additional small area of hypoattenuation in the right corona radiata. No acute hemorrhage. No hydrocephalus. No mass lesion. No abnormal mass effect. No midline shift. No extra-axial fluid collection. Vascular: No hyperdense vessel identified. Calcific atherosclerosis. Skull: No acute fracture. Punctate foreign body in the subcutaneous high posterior left scalp without soft tissue swelling. Sinuses/Orbits: No acute finding. Other: No mastoid effusions. IMPRESSION: 1. Small area of  hypoattenuation in the left periatrial white matter could represent chronic microvascular ischemic disease or age indeterminate lacunar infarct. Additional age indeterminate lacunar infarct within the right corona radiata, favored remote. If there is concern for acute infarct, MRI could further evaluate. 2. No acute hemorrhage. 3. Punctate foreign body in the subcutaneous high posterior left scalp, likely remote given no scalp swelling. Electronically Signed   By: Margaretha Sheffield MD   On: 12/24/2020 12:30   MR ANGIO HEAD WO CONTRAST  Result Date: 12/24/2020 CLINICAL DATA:  Follow-up examination for acute stroke. EXAM: MRA HEAD WITHOUT CONTRAST TECHNIQUE: Angiographic images of the Circle of Willis were obtained using MRA technique without intravenous contrast. COMPARISON:  Prior MRI from earlier the same day. FINDINGS: ANTERIOR CIRCULATION: Visualized distal cervical segments of the internal carotid arteries are widely patent with symmetric antegrade flow. Petrous, cavernous, and supraclinoid segments widely patent without stenosis or other abnormality. A1 segments widely patent. Normal anterior communicating artery complex. Anterior cerebral arteries widely patent to their distal aspects. No M1 stenosis or occlusion. Normal MCA bifurcations. Distal MCA branches well perfused and symmetric. POSTERIOR CIRCULATION: Both V4 segments widely patent to the vertebrobasilar junction without stenosis. Left vertebral artery dominant. Both PICA origins patent and normal. Basilar widely patent to its distal aspect. Superior cerebellar arteries patent bilaterally. Both PCAs supplied via the basilar as  well as small bilateral posterior communicating arteries. PCAs well perfused to their distal aspects without stenosis. No intracranial aneurysm or other vascular abnormality. IMPRESSION: Normal intracranial MRA. No large vessel occlusion, hemodynamically significant stenosis, or other acute vascular abnormality. Electronically  Signed   By: Adrienne Lane M.D.   On: 12/24/2020 22:40   MR BRAIN WO CONTRAST  Result Date: 12/24/2020 CLINICAL DATA:  Neuro deficit, acute stroke suspected. EXAM: MRI HEAD WITHOUT CONTRAST TECHNIQUE: Multiplanar, multiecho pulse sequences of the brain and surrounding structures were obtained without intravenous contrast. COMPARISON:  Same day head CT. FINDINGS: Brain: Acute infarct within the right hemi pons. Mild associated edema without substantial mass effect. Small acute infarct in the left frontal subcortical white matter (see series 2 and 250, image 29) without associated edema. Small remote lacunar infarcts in the right corona radiata and left periatrial white matter. No hydrocephalus. No acute hemorrhage. No mass lesion. No extra-axial fluid collection. Vascular: Major arterial flow voids are maintained at the skull base. Skull and upper cervical spine: Normal marrow signal. Sinuses/Orbits: Sinuses are clear.  Unremarkable orbits. IMPRESSION: 1. Acute infarct in the right hemipons. Additional small acute infarct in the left frontal subcortical white matter. 2. Mild associated edema in the right pons without substantial mass effect. 3. Small remote lacunar infarcts in the right corona radiata and left periatrial white matter. Findings discussed with Dr. Shelly Coss via telephone at 2:02 PM. Electronically Signed   By: Margaretha Sheffield MD   On: 12/24/2020 14:05   DG Chest Port 1 View  Result Date: 01/05/2021 CLINICAL DATA:  55 year old female positive COVID-19. Shortness of breath. EXAM: PORTABLE CHEST 1 VIEW COMPARISON:  Portable chest 01/04/2021 and earlier. FINDINGS: Portable AP upright view at 0757 hours. Continued low lung volumes, although mildly improved from 01/03/2020. Mediastinal contours remain normal. Visualized tracheal air column is within normal limits. Allowing for portable technique the lungs are clear. No pneumothorax or pleural effusion. Paucity of bowel gas.  No acute osseous  abnormality identified. IMPRESSION: Negative portable chest. Electronically Signed   By: Genevie Ann M.D.   On: 01/05/2021 08:12   DG Chest Port 1 View  Result Date: 01/04/2021 CLINICAL DATA:  COVID positive, dyspnea EXAM: PORTABLE CHEST 1 VIEW COMPARISON:  01/02/2021 chest radiograph. FINDINGS: Stable cardiomediastinal silhouette with normal heart size. No pneumothorax. No pleural effusion. Lungs appear clear, with no acute consolidative airspace disease and no pulmonary edema. IMPRESSION: No active disease. Electronically Signed   By: Ilona Sorrel M.D.   On: 01/04/2021 08:34   DG CHEST PORT 1 VIEW  Result Date: 01/02/2021 CLINICAL DATA:  Fever EXAM: PORTABLE CHEST 1 VIEW COMPARISON:  02/22/2020 FINDINGS: Low lung volumes. No focal airspace disease or pleural effusion. No pneumothorax. Minimal atelectasis right base IMPRESSION: Low lung volumes with minimal atelectasis right base. Electronically Signed   By: Donavan Foil M.D.   On: 01/02/2021 20:23   ECHOCARDIOGRAM COMPLETE  Result Date: 12/25/2020    ECHOCARDIOGRAM REPORT   Patient Name:   JAELEAH SMYSER Date of Exam: 12/25/2020 Medical Rec #:  [id number]      Height:       68.0 in Accession #:    [id number]     Weight:       224.0 lb Date of Birth:  [birth year]      BSA:          2.145 m Patient Age:    55 years       BP:  137/71 mmHg Patient Gender: F              HR:           81 bpm. Exam Location:  Inpatient Procedure: 2D Echo, Color Doppler and Cardiac Doppler Indications:    Stroke i163.96  History:        Patient has prior history of Echocardiogram examinations, most                 recent 06/16/2020. CHF, Arrythmias:Atrial Fibrillation; Risk                 Factors:Hypertension, Diabetes and Dyslipidemia.  Sonographer:    Raquel Sarna Senior RDCS Referring Phys: [place]  1. Since the last study on 06/16/20 LVEF remains mildly decreased with diffuse hypokinesis and LVEF 45-50%. RVEF is mildly decreased, RV is now less dilated  and RVSP has improved from 50 to 30 mmHg.  2. Left ventricular ejection fraction, by estimation, is 45 to 50%. The left ventricle has mildly decreased function. The left ventricle demonstrates global hypokinesis. Left ventricular diastolic function could not be evaluated.  3. Right ventricular systolic function is mildly reduced. The right ventricular size is normal. There is normal pulmonary artery systolic pressure. The estimated right ventricular systolic pressure is 41.2 mmHg.  4. Left atrial size was mildly dilated.  5. The mitral valve is normal in structure. Mild mitral valve regurgitation. No evidence of mitral stenosis.  6. The aortic valve is normal in structure. Aortic valve regurgitation is not visualized. No aortic stenosis is present.  7. The inferior vena cava is normal in size with <50% respiratory variability, suggesting right atrial pressure of 8 mmHg. FINDINGS  Left Ventricle: Left ventricular ejection fraction, by estimation, is 45 to 50%. The left ventricle has mildly decreased function. The left ventricle demonstrates global hypokinesis. The left ventricular internal cavity size was normal in size. There is  no left ventricular hypertrophy. Left ventricular diastolic function could not be evaluated due to atrial fibrillation. Left ventricular diastolic function could not be evaluated. Right Ventricle: The right ventricular size is normal. No increase in right ventricular wall thickness. Right ventricular systolic function is mildly reduced. There is normal pulmonary artery systolic pressure. The tricuspid regurgitant velocity is 2.35 m/s, and with an assumed right atrial pressure of 8 mmHg, the estimated right ventricular systolic pressure is 87.8 mmHg. Left Atrium: Left atrial size was mildly dilated. Right Atrium: Right atrial size was normal in size. Pericardium: There is no evidence of pericardial effusion. Mitral Valve: The mitral valve is normal in structure. Mild mitral valve  regurgitation. No evidence of mitral valve stenosis. Tricuspid Valve: The tricuspid valve is normal in structure. Tricuspid valve regurgitation is mild . No evidence of tricuspid stenosis. Aortic Valve: The aortic valve is normal in structure. Aortic valve regurgitation is not visualized. No aortic stenosis is present. Pulmonic Valve: The pulmonic valve was normal in structure. Pulmonic valve regurgitation is not visualized. No evidence of pulmonic stenosis. Aorta: The aortic root is normal in size and structure. Venous: The inferior vena cava is normal in size with less than 50% respiratory variability, suggesting right atrial pressure of 8 mmHg. IAS/Shunts: No atrial level shunt detected by color flow Doppler.  LEFT VENTRICLE PLAX 2D LVIDd:         4.80 cm LVIDs:         4.20 cm LV PW:         1.20 cm LV IVS:  0.70 cm LVOT diam:     2.30 cm LV SV:         54 LV SV Index:   25 LVOT Area:     4.15 cm  LV Volumes (MOD) LV vol d, MOD A2C: 84.7 ml LV vol d, MOD A4C: 99.4 ml LV vol s, MOD A2C: 60.2 ml LV vol s, MOD A4C: 65.7 ml LV SV MOD A2C:     24.5 ml LV SV MOD A4C:     99.4 ml LV SV MOD BP:      31.4 ml RIGHT VENTRICLE RV S prime:     7.51 cm/s TAPSE (M-mode): 2.3 cm LEFT ATRIUM             Index       RIGHT ATRIUM           Index LA diam:        3.60 cm 1.68 cm/m  RA Area:     15.70 cm LA Vol (A2C):   83.1 ml 38.75 ml/m RA Volume:   41.60 ml  19.40 ml/m LA Vol (A4C):   62.1 ml 28.96 ml/m LA Biplane Vol: 71.2 ml 33.20 ml/m  AORTIC VALVE LVOT Vmax:   66.25 cm/s LVOT Vmean:  43.275 cm/s LVOT VTI:    0.129 m  AORTA Ao Root diam: 2.80 cm TRICUSPID VALVE TR Peak grad:   22.1 mmHg TR Vmax:        235.00 cm/s  SHUNTS Systemic VTI:  0.13 m Systemic Diam: 2.30 cm Ena Dawley MD Electronically signed by Ena Dawley MD Signature Date/Time: 12/25/2020/11:53:23 AM    Final    VAS US CAROTID  Result Date: 12/25/2020 Carotid Arterial Duplex Study Indications:   CVA and Left side weakness, left facial drrop,  speech                disturbance. Risk Factors:  Hypertension, Diabetes, no history of smoking. Other Factors: CKD4, CHF, AFIB, Obesity. Performing Technologist: Rogelia Rohrer  Examination Guidelines: A complete evaluation includes B-mode imaging, spectral Doppler, color Doppler, and power Doppler as needed of all accessible portions of each vessel. Bilateral testing is considered an integral part of a complete examination. Limited examinations for reoccurring indications may be performed as noted.  Right Carotid Findings: +----------+--------+--------+--------+------------------+--------+           PSV cm/sEDV cm/sStenosisPlaque DescriptionComments +----------+--------+--------+--------+------------------+--------+ CCA Prox  97      11                                         +----------+--------+--------+--------+------------------+--------+ CCA Distal78      28                                         +----------+--------+--------+--------+------------------+--------+ ICA Prox  65      16      1-39%                              +----------+--------+--------+--------+------------------+--------+ ICA Distal109     25                                         +----------+--------+--------+--------+------------------+--------+  ECA       89      0                                          +----------+--------+--------+--------+------------------+--------+ +----------+--------+-------+----------------+-------------------+           PSV cm/sEDV cmsDescribe        Arm Pressure (mmHG) +----------+--------+-------+----------------+-------------------+ [id number]     0      Multiphasic, WNL                    +----------+--------+-------+----------------+-------------------+ +---------+--------+--+--------+--+---------+ VertebralPSV cm/s61EDV cm/s13Antegrade +---------+--------+--+--------+--+---------+  Left Carotid Findings:  +----------+--------+--------+--------+------------------+--------+           PSV cm/sEDV cm/sStenosisPlaque DescriptionComments +----------+--------+--------+--------+------------------+--------+ CCA Prox  107     15                                         +----------+--------+--------+--------+------------------+--------+ CCA Distal84      18                                         +----------+--------+--------+--------+------------------+--------+ ICA Prox  89      25      1-39%                              +----------+--------+--------+--------+------------------+--------+ ICA Distal93      28                                         +----------+--------+--------+--------+------------------+--------+ ECA       64      0                                          +----------+--------+--------+--------+------------------+--------+ +----------+--------+--------+----------------+-------------------+           PSV cm/sEDV cm/sDescribe        Arm Pressure (mmHG) +----------+--------+--------+----------------+-------------------+ [id number]      11      Multiphasic, WNL                    +----------+--------+--------+----------------+-------------------+ +---------+--------+--+--------+--+---------+ VertebralPSV cm/s63EDV cm/s19Antegrade +---------+--------+--+--------+--+---------+   Summary: Right Carotid: Velocities in the right ICA are consistent with a 1-39% stenosis.                The extracranial vessels were near-normal with only minimal wall                thickening or plaque. Left Carotid: Velocities in the left ICA are consistent with a 1-39% stenosis.               The extracranial vessels were near-normal with only minimal wall               thickening or plaque. Vertebrals:  Bilateral vertebral arteries demonstrate antegrade flow. Subclavians: Normal flow hemodynamics were seen in bilateral subclavian              arteries. *See table(s) above  for measurements and observations.     Preliminary     Micro Results     Recent Results (from the past 240 hour(s))  Culture, Urine     Status: Abnormal   Collection Time: 01/02/21  5:34 AM   Specimen: Urine, Random  Result Value Ref Range Status   Specimen Description URINE, RANDOM  Final   Special Requests   Final    NONE Performed at [facility], [address], [place], [place] [address]    Culture 60,000 COLONIES/mL STAPHYLOCOCCUS EPIDERMIDIS (A)  Final   Report Status 01/05/2021 FINAL  Final   Organism ID, Bacteria STAPHYLOCOCCUS EPIDERMIDIS (A)  Final      Susceptibility   Staphylococcus epidermidis - MIC*    CIPROFLOXACIN 1 SENSITIVE Sensitive     GENTAMICIN 8 INTERMEDIATE Intermediate     NITROFURANTOIN <=16 SENSITIVE Sensitive     OXACILLIN <=0.25 SENSITIVE Sensitive     TETRACYCLINE >=16 RESISTANT Resistant     VANCOMYCIN 2 SENSITIVE Sensitive     TRIMETH/SULFA <=10 SENSITIVE Sensitive     CLINDAMYCIN RESISTANT Resistant     RIFAMPIN <=0.5 SENSITIVE Sensitive     Inducible Clindamycin POSITIVE Resistant     * 60,000 COLONIES/mL STAPHYLOCOCCUS EPIDERMIDIS  Culture, blood (routine x 2)     Status: None (Preliminary result)   Collection Time: 01/02/21 11:10 PM   Specimen: BLOOD  Result Value Ref Range Status   Specimen Description BLOOD LEFT ANTECUBITAL  Final   Special Requests   Final    BOTTLES DRAWN AEROBIC AND ANAEROBIC Blood Culture adequate volume   Culture   Final    NO GROWTH 2 DAYS Performed at [facility], [address] [place], [place] [address]    Report Status PENDING  Incomplete  Culture, blood (routine x 2)     Status: None (Preliminary result)   Collection Time: 01/02/21 11:18 PM   Specimen: BLOOD LEFT HAND  Result Value Ref Range Status   Specimen Description BLOOD LEFT HAND  Final   Special Requests AEROBIC BOTTLE ONLY Blood Culture adequate volume  Final   Culture   Final    NO GROWTH 2 DAYS Performed at [facility], [place] [address], [place], [place] [address]    Report Status PENDING  Incomplete  MRSA PCR Screening     Status: None   Collection Time: 01/03/21  2:06 PM   Specimen: Nasal Mucosa; Nasopharyngeal  Result Value Ref Range Status   MRSA by PCR NEGATIVE NEGATIVE Final    Comment:        The GeneXpert MRSA Assay (FDA approved for NASAL specimens only), is one component of a comprehensive MRSA colonization surveillance program. It is not intended to diagnose MRSA infection nor to guide or monitor treatment for MRSA infections. Performed at [facility], [place] [address] [place], [place] [address]   SARS CORONAVIRUS 2 (TAT 6-24 HRS) Nasopharyngeal Nasopharyngeal Swab     Status: Abnormal   Collection Time: 01/03/21  5:14 PM   Specimen: Nasopharyngeal Swab  Result Value Ref Range Status   SARS Coronavirus 2 POSITIVE (A) NEGATIVE Final    Comment: (NOTE) SARS-CoV-2 target nucleic acids are DETECTED.  The SARS-CoV-2 RNA is generally detectable in upper and lower respiratory specimens during the acute phase of infection. Positive results are indicative of the presence of SARS-CoV-2 RNA. Clinical correlation with patient history and other diagnostic information is  necessary to determine patient infection status. Positive results  do not rule out bacterial infection or co-infection with other viruses.  The expected result is Negative.  Fact Sheet for Patients: SugarRoll.be  Fact Sheet for Healthcare Providers: https://www.woods-mathews.com/  This test is not yet approved or cleared by the [place] FDA and  has been authorized for detection and/or diagnosis of SARS-CoV-2 by FDA under an Emergency Use Authorization (EUA). This EUA will remain  in effect (meaning this test can be used) for the duration of the COVID-19 declaration under Section 564(b)(1) of the Act, 21 U. S.C. section 360bbb-3(b)(1), unless the authorization is  terminated or revoked sooner.   Performed at [facility], [place] [address], [place], [place] [address]     Today   Subjective    Oralia Criger today has no headache,no chest abdominal pain,no new weakness tingling or numbness, feels much better wants to go home today, plan was clarified by the patient more than 3 times today, yesterday and day before.   Objective   Blood pressure (!) 163/97, pulse 68, temperature 97.9 F (36.6 C), temperature source Oral, resp. rate 16, weight 104.1 kg, SpO2 100 %.   Intake/Output Summary (Last 24 hours) at 01/06/2021 1021 Last data filed at 01/05/2021 1503 Gross per 24 hour  Intake 100 ml  Output --  Net 100 ml    Exam  Awake Alert, left-sided facial droop and slight weakness in the left arm [place].AT,PERRAL Supple Neck,No JVD, No cervical lymphadenopathy appriciated.  Symmetrical Chest wall movement, Good air movement bilaterally, CTAB RRR,No Gallops,Rubs or new Murmurs, No Parasternal Heave +ve B.Sounds, Abd Soft, Non tender, No organomegaly appriciated, No rebound -guarding or rigidity. No Cyanosis, Clubbing or edema, No new Rash or bruise   Data Review   CBC w Diff:  Lab Results  Component Value Date   WBC 5.2 01/06/2021   HGB 11.6 (L) 01/06/2021   HGB 12.7 10/23/2020   HCT 35.2 (L) 01/06/2021   HCT 38.8 10/23/2020   PLT 154 01/06/2021   PLT 166 10/23/2020   LYMPHOPCT 17 01/06/2021   MONOPCT 8 01/06/2021   EOSPCT 0 01/06/2021   BASOPCT 0 01/06/2021    CMP:  Lab Results  Component Value Date   NA 139 01/06/2021   NA 148 (H) 10/23/2020   K 3.9 01/06/2021   CL 109 01/06/2021   CO2 19 (L) 01/06/2021   BUN 36 (H) 01/06/2021   BUN 30 (H) 10/23/2020   CREATININE 1.87 (H) 01/06/2021   PROT 6.3 (L) 01/06/2021   PROT 7.2 10/23/2020   ALBUMIN 2.6 (L) 01/06/2021   ALBUMIN 3.9 10/23/2020   BILITOT 0.5 01/06/2021   BILITOT 0.4 10/23/2020   ALKPHOS 70 01/06/2021   AST 37 01/06/2021   ALT 32 01/06/2021  .   Total  Time in preparing paper work, data evaluation and todays exam - 72 minutes  Lala Lund M.D on 01/06/2021 at 10:21 AM  [facility]

## 2021-01-06 NOTE — Plan of Care (Signed)
Adrienne Lane to be D/C'd  per MD order.  Discussed with the patient and all questions fully answered.  VSS, Skin clean, dry and intact without evidence of skin break down, no evidence of skin tears noted.  IV catheter discontinued intact. Site without signs and symptoms of complications. Dressing and pressure applied.  An After Visit Summary was printed and given to the patient. Patient received prescription.  D/c education completed with patient/family including follow up instructions, medication list, d/c activities limitations if indicated, with other d/c instructions as indicated by MD - patient able to verbalize understanding, all questions fully answered.   Patient instructed to return to ED, call 911, or call MD for any changes in condition.   Patient to be escorted via [place], and D/C home via private auto.  Problem: Education: Goal: Knowledge of risk factors and measures for prevention of condition will improve 01/06/2021 1720 by Haze Justin, RN Outcome: Adequate for Discharge 01/06/2021 1719 by Haze Justin, RN Outcome: Adequate for Discharge   Problem: Coping: Goal: Psychosocial and spiritual needs will be supported 01/06/2021 1720 by Haze Justin, RN Outcome: Adequate for Discharge 01/06/2021 1719 by Haze Justin, RN Outcome: Adequate for Discharge   Problem: Respiratory: Goal: Will maintain a patent airway 01/06/2021 1720 by Haze Justin, RN Outcome: Adequate for Discharge 01/06/2021 1719 by Haze Justin, RN Outcome: Adequate for Discharge Goal: Complications related to the disease process, condition or treatment will be avoided or minimized 01/06/2021 1720 by Haze Justin, RN Outcome: Adequate for Discharge 01/06/2021 1719 by Haze Justin, RN Outcome: Adequate for Discharge

## 2021-01-06 NOTE — Progress Notes (Signed)
Physical Therapy Treatment Patient Details Name: Adrienne Lane MRN: [id number] DOB: 01-Jan-1966 Today's Date: 01/06/2021    History of Present Illness 55 y.o. female with medical history significant of CVA, diabetes, CKD 3, A. fib, constipation, CHF, hypertension, normocytic anemia, hyperlipidemia who is being evaluated for acute febrile illness for the past 2 days at [facility] inpatient rehab. Pt recently admitted for acute CVA 1/5-1/8. Pt found to be COVID+ upon admission.    PT Comments    Pt has made progress today with completing stairs, after recent attempts with therapy being unsuccessful.  Pt is concerned about her therapy options after hosp, and did explain to her that she would have a good location to get therapy.  Pt is showing better control on LLE today, both with available resistance for there ex and control of leg to stand on it for support to step up on stairs.  Follow as needed for acute therapy goals, and may need to add HHPT for her discharge plan.  Unsure if this is an option with Covid quarantine.   Follow Up Recommendations  Outpatient PT;Supervision for mobility/OOB;Supervision/Assistance - 24 hour     Equipment Recommendations  None recommended by PT    Recommendations for Other Services Rehab consult     Precautions / Restrictions Precautions Precautions: Fall Precaution Comments: L sided weakness Restrictions Weight Bearing Restrictions: No    Mobility  Bed Mobility               General bed mobility comments: in recliner on PT entry  Transfers Overall transfer level: Needs assistance Equipment used: None Transfers: Sit to/from Stand Sit to Stand: Min guard         General transfer comment: standing with better stability today  Ambulation/Gait Ambulation/Gait assistance: Min guard Gait Distance (Feet): 15 Feet Assistive device: None Gait Pattern/deviations: Step-through pattern;Step-to pattern         Stairs Stairs: Yes Stairs  assistance: Min assist;Mod assist Stair Management: One rail Right;Two rails;Step to pattern;Forwards;Backwards Number of Stairs: 5 General stair comments: able to stand and step up but struggles with support on LLE to get up to next step   Wheelchair Mobility    Modified Rankin (Stroke Patients Only) Modified Rankin (Stroke Patients Only) Pre-Morbid Rankin Score: No symptoms Modified Rankin: Moderate disability     Balance Overall balance assessment: Needs assistance Sitting-balance support: Feet supported Sitting balance-Leahy Scale: Good     Standing balance support: No upper extremity supported Standing balance-Leahy Scale: Fair                              Cognition Arousal/Alertness: Awake/alert Behavior During Therapy: WFL for tasks assessed/performed Overall Cognitive Status: Within Functional Limits for tasks assessed                                 General Comments: had thoughtful conversation with pt about her rehab follow up      Exercises General Exercises - Lower Extremity Ankle Circles/Pumps: AROM;5 reps Long Arc Quad: Strengthening;10 reps Heel Slides: Strengthening;10 reps Hip ABduction/ADduction: Strengthening;10 reps Hip Flexion/Marching: AROM;10 reps    General Comments General comments (skin integrity, edema, etc.): pt was seen for stair training as she is going home and was not previously able to get up 5 steps      Pertinent Vitals/Pain Pain Assessment: No/denies pain    Home Living  Prior Function            PT Goals (current goals can now be found in the care plan section) Acute Rehab PT Goals Patient Stated Goal: get stronger and get home Progress towards PT goals: Progressing toward goals    Frequency    Min 4X/week      PT Plan Current plan remains appropriate    Co-evaluation              AM-PAC PT "6 Clicks" Mobility   Outcome Measure  Help needed  turning from your back to your side while in a flat bed without using bedrails?: None Help needed moving from lying on your back to sitting on the side of a flat bed without using bedrails?: None Help needed moving to and from a bed to a chair (including a wheelchair)?: A Little Help needed standing up from a chair using your arms (e.g., wheelchair or bedside chair)?: A Little Help needed to walk in hospital room?: A Little Help needed climbing 3-5 steps with a railing? : A Lot 6 Click Score: 19    End of Session Equipment Utilized During Treatment: Gait belt Activity Tolerance: Patient tolerated treatment well Patient left: in chair;with call bell/phone within reach Nurse Communication: Mobility status PT Visit Diagnosis: Unsteadiness on feet (R26.81);Difficulty in walking, not elsewhere classified (R26.2);Hemiplegia and hemiparesis;Muscle weakness (generalized) (M62.81);Other symptoms and signs involving the nervous system (R29.898);Other abnormalities of gait and mobility (R26.89) Hemiplegia - Right/Left: Left Hemiplegia - dominant/non-dominant: Non-dominant Hemiplegia - caused by: Cerebral infarction     Time: 1244-1313 PT Time Calculation (min) (ACUTE ONLY): 29 min  Charges:  $Gait Training: 8-22 mins $Therapeutic Exercise: 8-22 mins                  Ramond Dial 01/06/2021, 5:23 PM  Mee Hives, PT MS Acute Rehab Dept. Number: [place] [facility] [place]

## 2021-01-07 ENCOUNTER — Telehealth: Payer: Self-pay

## 2021-01-07 MED FILL — POTASSIUM CHLORIDE ER 10 ME: 10 | 30 days supply | Qty: 120 | Fill #4

## 2021-01-07 MED FILL — ISOSORBIDE MN ER 60 MG TAB: 60 | 30 days supply | Qty: 30 | Fill #1

## 2021-01-07 NOTE — Telephone Encounter (Signed)
Transition Care Management Unsuccessful Follow-up Telephone Call  Date of discharge and from where:  01/06/2021, [facility]   Attempts:  1st Attempt  Reason for unsuccessful TCM follow-up call:  Left voice message - call placed to # [phone]  Patient has appointment with Dr Juleen China 01/20/2021

## 2021-01-07 NOTE — Telephone Encounter (Signed)
Transition of Care Contact from [place]  Date of Discharge: 01/03/21 Date of Contact: 01/07/21 Method of contact: phone - attempted  Attempted to contact patient to discuss transition of care from inpatient admission.  Patient did not answer the phone.  Message was left on patient's voicemail informing them we would attempt to call them again

## 2021-01-08 ENCOUNTER — Telehealth: Payer: Self-pay

## 2021-01-08 LAB — CULTURE, BLOOD (ROUTINE X 2)
Culture: NO GROWTH
Culture: NO GROWTH
Special Requests: ADEQUATE
Special Requests: ADEQUATE

## 2021-01-08 NOTE — Telephone Encounter (Signed)
Transition Care Management Follow-up Telephone Call  Date of discharge and from where: 01/06/2021, [facility]   How have you been since you were released from the hospital? She said she is feeling much better than she had been feeling.  Remaining isolated   Any questions or concerns? Yes - she needs a refill of atorvastatin.  She said she had been on torsemide but it is not listed on her AVS. She wants to know if she should be taking it. Informed her that this CM would notify Dr Juleen China of her question.   She is also concerned about regaining functional use of her left upper and lower extremities. She has appointments scheduled for outpatient PT/OT  She has been working with an attorney to file a disability/ medicaid claim.  She stated that they told her it should be coming through soon.   Has appointment for second COVID vaccine 01/15/2021.   Items Reviewed:  Did the pt receive and understand the discharge instructions provided? Yes   Medications obtained and verified? Yes   Other? No   Any new allergies since your discharge? No   Do you have support at home? Yes , her husband   [place] and Equipment/Supplies: Were home health services ordered? no If so, what is the name of the agency?  n/a Has the agency set up a time to come to the patient's home? not applicable Were any new equipment or medical supplies ordered?  No What is the name of the medical supply agency? n/a Were you able to get the supplies/equipment? not applicable Do you have any questions related to the use of the equipment or supplies? No  Functional Questionnaire: (I = Independent and D = Dependent)ome  ADLs: needs some assistance with ADLS, family assists as needed.  Has cane to use with ambulation.   Follow up appointments reviewed:   PCP Hospital f/u appt confirmed? Yes  - Dr Juleen China 01/20/2021  [facility] f/u appt confirmed? Yes  - neurology -  01/29/2021 ; cardiology  -01/22/2021.  Transportation needed? No   If their condition worsens, is the pt aware to call PCP or go to the Emergency Dept.? yes  Was the patient provided with contact information for the PCP's office or ED? Yes  Was to pt encouraged to call back with questions or concerns? Yes

## 2021-01-09 ENCOUNTER — Telehealth (HOSPITAL_COMMUNITY): Payer: Self-pay

## 2021-01-09 NOTE — Telephone Encounter (Signed)
Pharmacy Transitions of Care Follow-up Telephone Call  Date of discharge: 01/06/21 Discharge Diagnosis: Acute CVA  How have you been since you were released from the hospital? Patient has been well. No issues with meds at this time. She is applying for medicaid and has follow ups scheduled.  Medication changes made at discharge: yes  Medication changes obtained and verified? yes    Medication Accessibility:  Home Pharmacy: [facility]  Was the patient provided with refills on discharged medications? Only 2 of the 7 Rxs had refills  Have all prescriptions been transferred from [facility] to home pharmacy? No - patient wanted to wait to have [facility] pull them over, but they are in [facility] system so they are there when patient needs them.  . Is the patient able to afford medications? Patient is currently in application process for Medicaid    Medication Review:  PRADAXA (Dabigatran) - Discussed importance of taking medication around the same time everyday  - Advised patient of medications to avoid (NSAIDs, ASA)  - Educated that Tylenol (acetaminophen) will be the preferred analgesic to prevent risk of bleeding  - Emphasized importance of monitoring for signs and symptoms of bleeding (abnormal bruising, prolonged bleeding, nose bleeds, bleeding from gums, discolored urine, black tarry stools)   Follow-up Appointments:  Scheduled to see Dr. Juleen China in [facility] on 01/20/21 and Cardiology on 01/22/21, will message providers about patient needing refills and patient reminded to mention needing refills at this visit as well.  If their condition worsens, is the pt aware to call PCP or go to the Emergency Dept.? yes  Final Patient Assessment: Patient has been well. No issues with meds at this time. She is applying for medicaid and has follow ups scheduled.

## 2021-01-12 ENCOUNTER — Ambulatory Visit: Payer: MEDICAID | Admitting: Rehabilitation

## 2021-01-16 NOTE — Therapy (Deleted)
[facility] [address] [place], [place], [address] Phone: [phone]   Fax:  [phone]  Physical Therapy Evaluation  Patient Details  Name: Adrienne Lane MRN: [id number] Date of Birth: 02/19/1966 No data recorded  Encounter Date: 01/19/2021      Visit Diagnosis: No diagnosis found.    PCP: Nicolette Bang, DO Referring [place]ier: Thurnell Lose, MD   Past Medical History:  Diagnosis Date  . Atrial fibrillation with RVR ([place])   . [place] 01/2020  . CHF (congestive heart failure) ([place]) 10/20/2019  . CKD (chronic kidney disease), stage IV ([place])   . Diabetes mellitus without complication ([place])   . DOE (dyspnea on exertion)    walking upstairs or up hill resolves in one minute  . Fibroids   . History of kidney stones   . History of recent blood transfusion 02/26/2020  . Hypertension   . Iron deficiency anemia   . Non-ischemic cardiomyopathy (HCC)    tachycardia induced  . Obese   . Peripheral edema   . Premature ventricular contractions (PVCs) ([facility])   . Umbilical hernia   . Wears glasses    Past Surgical History:  Procedure Laterality Date  . CHOLECYSTECTOMY    . CYSTOSCOPY W/ URETERAL STENT PLACEMENT Bilateral 05/24/2020   Procedure: CYSTOSCOPY WITH RETROGRADE PYELOGRAM/URETERAL STENT PLACEMENT;  Surgeon: Robley Fries, MD;  Location: [facility];  Service: Urology;  Laterality: Bilateral;  . CYSTOSCOPY/RETROGRADE/URETEROSCOPY Bilateral 04/03/2020   Procedure: CYSTOSCOPY/RETROGRADE/URETEROSCOPY;  Surgeon: Ardis Hughs, MD;  Location: [facility];  Service: Urology;  Laterality: Bilateral;  . HYSTERECTOMY ABDOMINAL WITH SALPINGO-OOPHORECTOMY  07/21/2020   Procedure: HYSTERECTOMY ABDOMINAL WITH BILATERAL SALPINGO-OOPHORECTOMY;  Surgeon: Sanjuana Kava, MD;  Location: [facility];  Service: Gynecology;;  . IR FLUORO GUIDE CV LINE RIGHT  02/21/2020  . IR US GUIDE VASC ACCESS RIGHT  02/21/2020  .  NEPHROLITHOTOMY Left 02/14/2020   Procedure: NEPHROLITHOTOMY PERCUTANEOUS/ SURGEON ACCESS/ LEFT PERCUTANEOUS NEPHROSTOMY TUBE PLACEMENT;  Surgeon: Ardis Hughs, MD;  Location: [facility];  Service: Urology;  Laterality: Left;  . NEPHROLITHOTOMY Left 02/21/2020   Procedure: NEPHROLITHOTOMY PERCUTANEOUS SECOND LOOK;  Surgeon: Ardis Hughs, MD;  Location: [facility];  Service: Urology;  Laterality: Left;  . NEPHROLITHOTOMY Left 02/26/2020   Procedure: NEPHROLITHOTOMY PERCUTANEOUS;  Surgeon: Ceasar Mons, MD;  Location: [facility];  Service: Urology;  Laterality: Left;  NEED 150 MIN  . NEPHROLITHOTOMY Right 03/24/2020   Procedure: NEPHROLITHOTOMY PERCUTANEOUS WITH ACCESS LEFT STENT REMOVAL;  Surgeon: Ardis Hughs, MD;  Location: [facility];  Service: Urology;  Laterality: Right;  . RIGHT HEART CATH N/A 11/09/2019   Procedure: RIGHT HEART CATH;  Surgeon: Jolaine Artist, MD;  Location: [facility];  Service: Cardiovascular;  Laterality: N/A;  . WISDOM TOOTH EXTRACTION     Patient Active Problem List   Diagnosis Date Noted  . Febrile illness, acute 01/03/2021  . Slow transit constipation   . Sore throat   . Irregular cardiac rhythm   . PAF (paroxysmal atrial fibrillation) ([place])   . Labile blood pressure   . Hypokalemia   . Labile blood glucose   . Poorly controlled type 2 diabetes mellitus with peripheral neuropathy ([place])   . Stage 3b chronic kidney disease ([place])   . Right pontine cerebrovascular accident ([place]) 12/27/2020  . Acute CVA (cerebrovascular accident) ([place]) 12/24/2020  . Hyperlipidemia 10/24/2020  . Vitamin D deficiency 10/24/2020  . Fibroids 07/21/2020  . S/P TAH (total abdominal hysterectomy) 07/21/2020  .  Chronic a-fib ([place]) 05/23/2020  . Hydroureter on right 05/23/2020  . Microalbuminuria 05/13/2020  . Non-ischemic cardiomyopathy ([place])   . Normocytic anemia   . Nephrolithiasis 02/14/2020  . Paroxysmal atrial fibrillation ([place]) 12/18/2019  . Frequent  PVCs 12/18/2019  . Gross hematuria 12/18/2019  . Staghorn renal calculus 12/18/2019  . Umbilical hernia without obstruction and without gangrene 12/18/2019  . Uterine fibroid 10/25/2019  . Right hip pain   . Type 2 diabetes mellitus ([place])   . Essential hypertension   . CHF (congestive heart failure) ([place]) 10/21/2019  . Atrial fibrillation with RVR ([place]) 10/21/2019  . Atrial fibrillation with rapid ventricular response ([place]) 10/20/2019    SUBJECTIVE: Pt reports of having stroke on 12/24/20. She was brought to ED via EMS on 12/24/20 due to left sided weakness and stroke like symptoms.  Pain: is patient experiencing pain? {Pain:25109}  Precautions: {Therapy precautions:24002}  Weight bearing restrictions: {Yes ***/No:24003}  Has patient fallen in last 6 months? {yes/no:20286}, Number of falls: ***  Living Environment:  Lives with: {places; lives with:5711::"lives with their family":1}  Lives in: {CHL Living Situation:16014002}  Stairs: {yes/no:20286}; {Stairs:24000}  Has following equipment at home: {Assistive devices:23999}  Prior level of function: {PLOF:24004}  Patient Goals: ***  OBJECTIVE:   Diagnostic findings: ***  Cognition:  Overall cognitive status: {cognition:24006}  Areas of impairment: {cognitive impairment:24009}  Commands: {commands:24018}  Attention: {intact/deficits:24005}  Memory: {intact/deficits:24005}  Awareness: {intact/deficits:24005}  Problem solving: {intact/deficits:24005}  Executive function:{Executive functioning:24008}  Behavior: {behavior:24019}     Sensation:  Light touch: {intact/deficits:24005}  Stereognosis: {intact/deficits:24005}  Hot/Cold: {intact/deficits:24005}  Proprioception: {intact/deficits:24005}  Edema: {edema:24020}  Muscle tone:  Affected body part: ***  Tone: {Tone:24023}  Muscle length: Hamstrings: Right *** deg; Left *** deg Thomas test: Right *** deg; Left *** deg  Deep Tendon Reflexes: {DTR  SITE:24025}   A/PROM Right 01/16/2021 Left 01/16/2021  Hip flexion *** deg *** deg  Hip abduction *** deg *** deg  Hip adduction *** deg *** deg  Hip internal rotation *** deg *** deg  Hip external rotation *** deg *** deg  Knee flexion *** deg *** deg  Knee extension *** deg *** deg  Ankle dorsiflexion *** deg *** deg  Ankle plantarflexion *** deg *** deg  Ankle inversion *** deg *** deg  Ankle eversion *** deg *** deg    MMT Right 01/16/2021 Left 01/16/2021  Hip flexion ***/5 ***/5  Hip abduction ***/5 ***/5  Hip adduction ***/5 ***/5  Hip internal rotation ***/5 ***/5  Hip external rotation ***/5 ***/5  Knee flexion ***/5 ***/5  Knee extension ***/5 ***/5  Ankle dorsiflexion ***/5 ***/5  Ankle plantarflexion ***/5 ***/5  Ankle inversion ***/5 ***/5  Ankle eversion ***/5 ***/5   Transfers: Assistive device utilized: {Assistive devices:23999}  Bed mobility: {Bed mobility:24027}  Transfers:    Sit to stand: {Levels of assistance:24026}   Stand to sit: {Levels of assistance:24026}   Chair to chair: {Levels of assistance:24026}   Floor: {Levels of assistance:24026}  Ramp: {Levels of assistance:24026}  Curb: {Levels of assistance:24026}  Gait:  Distance walked: ***  Assistive device utilized: {Assistive devices:23999}  Level of assistance: {Levels of assistance:24026}  Comments: ***  Functional tests: {Functional tests:24029}  Patient surveys: {rehab surveys:24030}  Today's Treatment: ***  Patient Education: ***  Home Exercise Program: ***  Assessment: Patient is a *** y.o. *** who was seen today for ***. Objective impairments include {opptimpairments:25111}. These impairments are limiting patient from {activity limitations:25113}. Personal factors including {Personal factors:25162} are also affecting patient's functional  outcome. Patient will benefit from skilled PT to address above impairments and improve overall function.  Rehab potential:  {rehabpotential:25112}  Clinical decision making: {clinical decision making:25114}  Evaluation complexity: {Evaluation complexity:25115}   Goals: Goals reviewed with patient? {yes/no:20286}  SHORT TERM GOALS:  STG Name Target Date Goal status  1 *** Comments:  *** {GOALSTATUS:25110}  2 *** Comments:  *** {GOALSTATUS:25110}  3 *** Comments:  *** {GOALSTATUS:25110}  4 *** Comments:  *** {GOALSTATUS:25110}  5 *** Comments:  *** {GOALSTATUS:25110}  6 *** Comments:  *** {GOALSTATUS:25110}  7 *** Comments:  *** {GOALSTATUS:25110}   LONG TERM GOALS:   LTG Name Target Date Goal status  1 *** Comments:  *** {GOALSTATUS:25110}  2 *** Comments:  *** {GOALSTATUS:25110}  3 *** Comments:  *** {GOALSTATUS:25110}  4 *** Comments:  *** {GOALSTATUS:25110}  5 *** Comments:  *** {GOALSTATUS:25110}  6 *** Comments: *** {GOALSTATUS:25110}  7 *** Comments:  *** {GOALSTATUS:25110}   PLAN: PT frequency: {PT frequency:25116}  PT duration: {PT duration:25117}  Planned Interventions: {PT planned interventions:25118::"Therapeutic exercises","Therapeutic activity","Neuro Muscular re-education","Balance training","Gait training","Patient/Family education","Joint mobilization"}  Kerrie Pleasure 01/16/2021, There are other unrelated non-urgent complaints, but due to the busy schedule and the amount of time I've already spent with her, time does not permit me to address these routine issues at today's visit. I've requested another appointment to review these additional issues.  08/21/2020, 1:10 PM   [facility] [address] [place], [place], [address] Phone: [phone]   Fax:  [phone]  Patient name: Adrienne Lane MRN: [id number] DOB: [birth year]       Kerrie Pleasure 01/16/2021, 12:43 PM  [facility] [address] [place], [place], [address] Phone:  [phone]   Fax:  [phone]  Name: Adrienne Lane MRN: [id number] Date of Birth: 1966/09/19

## 2021-01-19 ENCOUNTER — Telehealth: Payer: Self-pay | Admitting: Internal Medicine

## 2021-01-19 ENCOUNTER — Ambulatory Visit: Payer: MEDICAID | Attending: Occupational Therapy | Admitting: Occupational Therapy

## 2021-01-19 ENCOUNTER — Other Ambulatory Visit: Payer: Self-pay | Admitting: Family Medicine

## 2021-01-19 ENCOUNTER — Ambulatory Visit: Payer: MEDICAID

## 2021-01-19 MED FILL — !LANTUS 100 UNITS/ML VIAL: 100 | 50 days supply | Qty: 10 | Fill #0

## 2021-01-19 NOTE — Telephone Encounter (Signed)
Requested Prescriptions  Pending Prescriptions Disp Refills  . LANTUS 100 UNIT/ML injection [Pharmacy Med Name: LANTUS 100 UNITS/ML VIAL 100 Solution] 10 mL 2    Sig: INJECT 0.2 MLS (20 UNITS TOTAL) INTO THE SKIN EVERY MORNING.     Endocrinology:  Diabetes - Insulins Failed - 01/19/2021  9:07 AM      Failed - HBA1C is between 0 and 7.9 and within 180 days    Hgb A1c MFr Bld  Date Value Ref Range Status  12/25/2020 8.0 (H) 4.8 - 5.6 % Final    Comment:    (NOTE) Pre diabetes:          5.7%-6.4%  Diabetes:              >6.4%  Glycemic control for   <7.0% adults with diabetes          Failed - Valid encounter within last 6 months    Recent Outpatient Visits          6 months ago Type 2 diabetes mellitus with hyperglycemia, with long-term current use of insulin [facility])   [place], Annie Main L, RPH-CPP   7 months ago Type 2 diabetes mellitus with hyperglycemia, with long-term current use of insulin [facility])   [place], Jarome Matin, RPH-CPP      Future Appointments            Tomorrow Nicolette Bang, DO Primary Care at [facility]   In 1 week Nicolette Bang, DO Primary Care at [facility]   In 2 weeks Frann Rider, NP [facility]

## 2021-01-19 NOTE — Telephone Encounter (Signed)
1) Medication(s) Requested (by name):  Lancets (ONETOUCH ULTRASOFT) lancets    2) [facility] of Choice:  [facility]    3) Special Requests:     Approved medications will be sent to the pharmacy, we will reach out if there is an issue.  Requests made after 3pm may not be addressed until the following business day!  If a patient is unsure of the name of the medication(s) please note and ask patient to call back when they are able to provide all info, do not send to responsible party until all information is available!

## 2021-01-20 ENCOUNTER — Encounter: Payer: Self-pay | Admitting: Internal Medicine

## 2021-01-20 ENCOUNTER — Ambulatory Visit (INDEPENDENT_AMBULATORY_CARE_PROVIDER_SITE_OTHER): Payer: HRSA Program | Admitting: Internal Medicine

## 2021-01-20 ENCOUNTER — Other Ambulatory Visit: Payer: Self-pay

## 2021-01-20 ENCOUNTER — Ambulatory Visit (INDEPENDENT_AMBULATORY_CARE_PROVIDER_SITE_OTHER): Payer: HRSA Program

## 2021-01-20 ENCOUNTER — Other Ambulatory Visit: Payer: Self-pay | Admitting: Internal Medicine

## 2021-01-20 VITALS — BP 144/95 | HR 72 | Temp 98.0°F | Resp 18 | Ht 68.0 in | Wt 229.2 lb

## 2021-01-20 DIAGNOSIS — Z09 Encounter for follow-up examination after completed treatment for conditions other than malignant neoplasm: Secondary | ICD-10-CM

## 2021-01-20 DIAGNOSIS — U071 COVID-19: Secondary | ICD-10-CM

## 2021-01-20 DIAGNOSIS — I6350 Cerebral infarction due to unspecified occlusion or stenosis of unspecified cerebral artery: Secondary | ICD-10-CM | POA: Diagnosis not present

## 2021-01-20 MED ORDER — ATORVASTATIN CALCIUM 80 MG PO TABS: 80.0000 mg | ORAL_TABLET | Freq: Every evening | ORAL | 0 refills | Status: DC

## 2021-01-20 MED FILL — ATORVASTATIN CALCIUM 80 MG: 80 | 90 days supply | Qty: 90 | Fill #0

## 2021-01-20 NOTE — Progress Notes (Signed)
Subjective:    Adrienne Lane - 55 y.o. female MRN [id number]  Date of birth: [birth year]  HPI  Adrienne Lane is here for hospital discharge follow up. Was hospitalized from 1/15-1/18. She was hospitalized for COVID 19 virus. Was admitted from inpatient rehab where patient had been after CVA.   She has residual left sided weakness, improving. Using cane with ambulation but doesn't feel like she is dependent on it. Husband told her she should use it. She has follow up with outpatient PT. Has neurology follow up 2/15. Wasn't taking Lipitor at home but was resumed in hospital because LDL was increased. She says she was not provided with Lipitor at time of discharge.   In terms of COVID, she is asymptomatic. Reports she has since tested negative. No symptoms.                                                           Hospital Course     1. Acute Covid 19 Viral infection in a patient with partial one-shot vaccination from mRNA vaccine few weeks ago - she far seems to have mild viral illness, no evidence of pulmonary involvement yet, chest x-ray was stable, she received 3 doses of steroids and Remdesivir, remains asymptomatic on room air, stable inflammatory markers with downtrend, clinically stable, will discharge home with outpatient PCP follow-up, finish outpatient vaccinations per PCP.  Note all her family members living in her household are currently COVID-positive with relatively mild symptoms according to the patient.  Will be discharged home, husband and daughter have been told about the plan, they had some reservations of the whole household being COVID-positive and with patient coming home with COVID infection and they were told that all have likely the same infection and do not need to quarantine amongst themselves, if there is family number or friend who is COVID-negative then definitely they need to quarantine from them.  Note patient wondered if she could go back to CIR unfortunately  according to see our policy patient at this time will not go to CIR unless she has a negative test which may take another 14 to 21 days, patient for now wishes to do outpatient PT and rehab, this plan was discussed by me with her multiple times, she completely understands the plan.  She also discussed this with case management on 01/05/2021 evening.  Note daughter was very upset that patient is coming home despite being COVID-19 positive, however all family members themselves at this time COVID-positive.  I tried to explain her the best I could.  She had told the nurse Elon Jester on 01/05/21  that she will sue [facility] for patient getting COVID-positive and being sent home with COVID infection.  SpO2: 100 %   2. 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 Was in inpatient rehab, continue PT OT outpatient, continue Pradaxa, statin and aspirin combination for secondary prevention.  Follow-up with PCP and neurology for secondary risk factor modulation.  Overall according to the patient her weakness is much improved.  3. Obesity. BMI 34. Follow-up with PCP for weight loss post discharge.  4. Dyslipidemia. On statin.  5.Hypertension.  On Coreg, Imdur, Norvasc, dose adjusted for better control PCP to monitor.  6. Paroxysmal atrial fibrillation [place] vas 2 score of greater than 4. Continue  Coreg and Pradaxa combination.  7.Asymptomatic PVCs. EF under 50%, EKG nonacute, previous EKG also showing frequent PVCs, have increased Coreg dose, electrolytes are stable.   8.CKD3B. Baseline creatinine close to 2.5. Currently at baseline.   9.DM type II on Lantus have added sliding scale upon discharge.  Follow CBGs and A1c with PCP.     Health Maintenance:  Health Maintenance Due  Topic Date Due  . COVID-19 Vaccine (2 - Moderna 3-dose series) 01/08/2021    -  reports that she has never smoked. She has never used smokeless tobacco. - Review of Systems:  Per HPI. - Past Medical History: Patient Active Problem List   Diagnosis Date Noted  . Febrile illness, acute 01/03/2021  . Slow transit constipation   . Sore throat   . Irregular cardiac rhythm   . PAF (paroxysmal atrial fibrillation) ([place])   . Labile blood pressure   . Hypokalemia   . Labile blood glucose   . Poorly controlled type 2 diabetes mellitus with peripheral neuropathy ([place])   . Stage 3b chronic kidney disease ([place])   . Right pontine cerebrovascular accident ([place]) 12/27/2020  . Acute CVA (cerebrovascular accident) ([place]) 12/24/2020  . Hyperlipidemia 10/24/2020  . Vitamin D deficiency 10/24/2020  . Fibroids 07/21/2020  . S/P TAH (total abdominal hysterectomy) 07/21/2020  . Chronic a-fib ([place]) 05/23/2020  . Hydroureter on right 05/23/2020  . Microalbuminuria 05/13/2020  . Non-ischemic cardiomyopathy ([place])   . Normocytic anemia   . Nephrolithiasis 02/14/2020  . Paroxysmal atrial fibrillation ([place]) 12/18/2019  . 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 Gross hematuria 12/18/2019  . Staghorn renal calculus 12/18/2019  . Umbilical hernia without obstruction and without gangrene 12/18/2019  . Uterine fibroid 10/25/2019  . Right hip pain   . Type 2 diabetes mellitus ([place])   . Essential hypertension   . CHF (congestive heart failure) ([place]) 10/21/2019  . Atrial fibrillation with RVR ([place]) 10/21/2019  . Atrial fibrillation with rapid ventricular response ([place]) 10/20/2019   - Medications: reviewed and updated   Objective:   Physical Exam BP (!) 144/95 (BP Location: Right Arm, Patient Position: Sitting, Cuff Size: Large)   Pulse 72   Temp 98 F (36.7 C) (Oral)   Resp 18   Ht 5\' 8"  (1.727 m)   Wt 229 lb 3.2 oz (104 kg)   SpO2 97%   BMI 34.85 kg/m  Physical Exam Constitutional:      General: She is not in acute distress.    Appearance: She is not diaphoretic.  Cardiovascular:     Comments: Occasionally irregular rhythm. Regular rate.  Pulmonary:     Effort: Pulmonary effort  is normal. No respiratory distress.     Breath sounds: Normal breath sounds. No wheezing or rales.  Musculoskeletal:        General: Normal range of motion.  Skin:    General: Skin is warm and dry.  Neurological:     Mental Status: She is alert and oriented to person, place, and time.     Comments: Left UE strength 5/5. Left LE strength 4/5.   Psychiatric:        Mood and Affect: Affect normal.        Judgment: Judgment normal.            Assessment & Plan:   1. Hospital discharge follow-up Reviewed hospital course, current medications, ensured proper f/u in place, and addressed concerns.  - DG Chest 2 View; Future - Basic metabolic panel - CBC  2. XFGHW-29  virus infection Recovered and asymptomatic. O2 saturation normal. Obtain repeat CXR per hospital d/c recommendations.  - DG Chest 2 View; Future  3. Right pontine cerebrovascular accident ([place]) Residual left sided weakness. Continue PT. Has neuro follow up. Will not monitor LDL as patient has not had Lipitor since hospital d/c. This was refilled. Continue ASA and Pradaxa with h/o PAF.  - atorvastatin (LIPITOR) 80 MG tablet; Take 1 tablet (80 mg total) by mouth every evening.  Dispense: 90 tablet; Refill: 0   Phill Myron, D.O. 01/20/2021, 10:22 AM Primary Care at [facility]

## 2021-01-20 NOTE — Progress Notes (Signed)
Needs script for atorvastatin

## 2021-01-21 ENCOUNTER — Other Ambulatory Visit: Payer: Self-pay

## 2021-01-21 LAB — BASIC METABOLIC PANEL
BUN/Creatinine Ratio: 9 (ref 9–23)
BUN: 13 mg/dL (ref 6–24)
CO2: 22 mmol/L (ref 20–29)
Calcium: 9.2 mg/dL (ref 8.7–10.2)
Chloride: 112 mmol/L — ABNORMAL HIGH (ref 96–106)
Creatinine, Ser: 1.41 mg/dL — ABNORMAL HIGH (ref 0.57–1.00)
GFR calc Af Amer: 49 mL/min/{1.73_m2} — ABNORMAL LOW (ref >59)
GFR calc non Af Amer: 43 mL/min/{1.73_m2} — ABNORMAL LOW (ref >59)
Glucose: 138 mg/dL — ABNORMAL HIGH (ref 65–99)
Potassium: 4 mmol/L (ref 3.5–5.2)
Sodium: 147 mmol/L — ABNORMAL HIGH (ref 134–144)

## 2021-01-21 LAB — CBC
Hematocrit: 35.8 % (ref 34.0–46.6)
Hemoglobin: 11.9 g/dL (ref 11.1–15.9)
MCH: 29 pg (ref 26.6–33.0)
MCHC: 33.2 g/dL (ref 31.5–35.7)
MCV: 87 fL (ref 79–97)
Platelets: 198 10*3/uL (ref 150–450)
RBC: 4.1 x10E6/uL (ref 3.77–5.28)
RDW: 14.4 % (ref 11.7–15.4)
WBC: 4.8 10*3/uL (ref 3.4–10.8)

## 2021-01-21 MED ORDER — ONETOUCH ULTRASOFT LANCETS MISC: 5 refills | Status: AC

## 2021-01-21 MED FILL — TRUEplus LANCETS 28G MISC: 50 days supply | Qty: 100 | Fill #0

## 2021-01-22 ENCOUNTER — Other Ambulatory Visit: Payer: Self-pay

## 2021-01-22 ENCOUNTER — Ambulatory Visit (HOSPITAL_COMMUNITY)
Admission: RE | Admit: 2021-01-22 | Discharge: 2021-01-22 | Disposition: A | Discharge status: Home / Self Care | Payer: Self-pay | Source: Ambulatory Visit | Attending: Cardiology | Admitting: Cardiology

## 2021-01-22 ENCOUNTER — Encounter (HOSPITAL_COMMUNITY): Payer: Self-pay

## 2021-01-22 ENCOUNTER — Other Ambulatory Visit (HOSPITAL_COMMUNITY): Payer: Self-pay | Admitting: Cardiology

## 2021-01-22 VITALS — BP 144/70 | HR 62 | Wt 229.6 lb

## 2021-01-22 DIAGNOSIS — Z79899 Other long term (current) drug therapy: Secondary | ICD-10-CM | POA: Insufficient documentation

## 2021-01-22 DIAGNOSIS — Z794 Long term (current) use of insulin: Secondary | ICD-10-CM | POA: Insufficient documentation

## 2021-01-22 DIAGNOSIS — I130 Hypertensive heart and chronic kidney disease with heart failure and stage 1 through stage 4 chronic kidney disease, or unspecified chronic kidney disease: Secondary | ICD-10-CM | POA: Insufficient documentation

## 2021-01-22 DIAGNOSIS — Z8616 Personal history of COVID-19: Secondary | ICD-10-CM | POA: Insufficient documentation

## 2021-01-22 DIAGNOSIS — Z8249 Family history of ischemic heart disease and other diseases of the circulatory system: Secondary | ICD-10-CM | POA: Insufficient documentation

## 2021-01-22 DIAGNOSIS — I5042 Chronic combined systolic (congestive) and diastolic (congestive) heart failure: Secondary | ICD-10-CM

## 2021-01-22 DIAGNOSIS — I480 Paroxysmal atrial fibrillation: Secondary | ICD-10-CM | POA: Insufficient documentation

## 2021-01-22 DIAGNOSIS — I5022 Chronic systolic (congestive) heart failure: Secondary | ICD-10-CM | POA: Insufficient documentation

## 2021-01-22 DIAGNOSIS — Z7901 Long term (current) use of anticoagulants: Secondary | ICD-10-CM | POA: Insufficient documentation

## 2021-01-22 DIAGNOSIS — Z560 Unemployment, unspecified: Secondary | ICD-10-CM | POA: Insufficient documentation

## 2021-01-22 DIAGNOSIS — Z7982 Long term (current) use of aspirin: Secondary | ICD-10-CM | POA: Insufficient documentation

## 2021-01-22 DIAGNOSIS — D259 Leiomyoma of uterus, unspecified: Secondary | ICD-10-CM | POA: Insufficient documentation

## 2021-01-22 DIAGNOSIS — Z6834 Body mass index (BMI) 34.0-34.9, adult: Secondary | ICD-10-CM | POA: Insufficient documentation

## 2021-01-22 DIAGNOSIS — E1122 Type 2 diabetes mellitus with diabetic chronic kidney disease: Secondary | ICD-10-CM | POA: Insufficient documentation

## 2021-01-22 DIAGNOSIS — I493 Ventricular premature depolarization: Secondary | ICD-10-CM | POA: Insufficient documentation

## 2021-01-22 DIAGNOSIS — N1832 Chronic kidney disease, stage 3b: Secondary | ICD-10-CM | POA: Insufficient documentation

## 2021-01-22 MED ORDER — HYDRALAZINE HCL 100 MG PO TABS: 50.0000 mg | ORAL_TABLET | Freq: Three times a day (TID) | ORAL | 6 refills | Status: DC

## 2021-01-22 MED FILL — hydrALAZINE HCL 100 MG TABS: 100 | 30 days supply | Qty: 45 | Fill #0

## 2021-01-22 NOTE — Progress Notes (Signed)
PCP: Dr Wynetta Emery  Primary Cardiologist: Dr Gardiner Rhyme HF MD: Dr [name] Nephorology: Dr Joelyn Oms   HPI: Adrienne Lane is a 55 year old with a history of morbid obesity, HTN, DM, PAF, PVCs, and chronic systolic HF.   Admitted to [facility] 10/20/19 after a mechanical fall and landed on her hip. ECHO showed EF 20-25% with severe RV dysfunction as well. 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 Started on amio/ heparin drip and IV lasix. Renal function worsened. Abdominal Xray showed large staghorn calculi. CT Renal Stone showed bilateral staghorn calculi without hydronephrosis and severe anasarca. Urology consulted on 11/3 with mention of possible nephrostomy tubes but renal US was negative for hydronephrosis. GYN consulted for uterine mass. Mass was thought to be large fibroid that will need to follow up after discharge. Nephrology consulted for worsening renal failure. Transferred to [facility] for CVVHD. Overall diuresed 90 pounds. Later transitioned to torsemide 40 mg daily. Placed on carvedilol and mexiletine due to high PVC burden. Discharged on 11/12/19 with weight 255 pounds.   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 Saw Dr. Lovena Le who was considering PVC ablation. Doubted mexilitene is helping much.  Admitted 6/21 with AKI on CKD. CT scan of the abdomen which showed bilateral hydronephrosis and large fibroid uterus. Creatinine on presentation was 3.24. Bilateral ureteral stent placement with bilateral retrograde pyelography. Creatinine improved.   07/2020 underwent total abdominal hysterectomy and bilateral salpingoopherectomy  12/2020 she was admitted for acute CVA. Presented w/ facial droop and blurry vision. She presented outside of window for TPA, it was not given. Seen by neurology/stroke team.  Completed work-up for CVA. MRI brain showed acute infarct in the right hemipons, additional small acute infarct in the left frontal subcortical white  matter. Mild associated edema in the right pons without substantial mass-effect. Small remote lacunar infarct in the right corona radiata and left periatrial white matter. She had no significant carotid artery stenosis. 2D echo showed reduced LVEF 45 to 50%, left ventricle global hypokinesis. No PFO. No intracardiac source of thrombus. Hemoglobin A1c 8.0, and LDL 173, not at goal. Aspirin 81 mg was added and Eliquis was switched to Pradaxa. She was discharged to CIR. While in CIR she developed upper respiratory symptoms and tested + for COVID and was readmitted to [facility] under [place]. Did not require intubation. Chest x-ray was stable, she received 3 doses of steroids and Remdesivir and recovered. Discharged home on 1/18.  She presents to clinic today for f/u. Doing ok from CHF standpoint. NYHA Class II. Wt stable at home. BP well controlled. Reports full med compliance. EKG shows ventricular bigeminy but asymptomatic. As noted above, echo done during recent hospitalization showed slight interval improvement in LVEF.  From a neuro standpoint, she continues w/ mild left sided UE weakness and is continuing w/ PT.    Echo 1/22: LVEF 45-50%. RV mildly reduced Echo 2/21 EF 40-45% hard to assess with PVCs. (read formally as 50-55%) RV mildly down No effusion.  ECHO 10/21/2019. EF 20-25%, pericardial effusion, mild LVH, RA/LA dilated. RV down.   RHC 11/12/19  RA = 11 RV = 47/15 PA = 49/17 (32) PCW = 20 Fick cardiac output/index = 8.8/3.8 PVR = 1.4 WU FA sat = 97% PA sat = 70%, 72% High SVC = 68%  ROS: All systems negative except as listed in HPI, PMH and Problem List.  SH:  Social History   Socioeconomic History   Marital status: Married    Spouse name:  Not on file   Number of children: 0   Years of education: Not on file   Highest education level: Associate degree: occupational, Hotel manager, or vocational program  Occupational History   Occupation: unemployed  Tobacco Use    Smoking status: Never Smoker   Smokeless tobacco: Never Used  Scientific laboratory technician Use: Never used  Substance and Sexual Activity   Alcohol use: Never   Drug use: Never   Sexual activity: Yes    Birth control/protection: None  Other Topics Concern   Not on file  Social History Narrative   Not on file   Social Determinants of Health   Financial Resource Strain: Not on file  Food Insecurity: Not on file  Transportation Needs: No Transportation Needs   Lack of Transportation (Medical): No   Lack of Transportation (Non-Medical): No  Physical Activity: Not on file  Stress: Not on file  Social Connections: Not on file  Intimate Partner Violence: Not on file    FH:  Family History  Problem Relation Age of Onset   Atrial fibrillation Mother    Hypertension Mother    Stroke Mother 87   Diabetes Mother    Diabetes Father    Hypertension Father    Diabetes Sister    Hypertension Sister    Diabetes Brother    Hypertension Brother    Diabetes Brother    Heart attack Brother    Stroke Maternal Grandmother 25    Past Medical History:  Diagnosis Date   Atrial fibrillation with RVR ([place])    Bigeminy 01/2020   CHF (congestive heart failure) ([place]) 10/20/2019   CKD (chronic kidney disease), stage IV (HCC)    Diabetes mellitus without complication ([place])    DOE (dyspnea on exertion)    walking upstairs or up hill resolves in one minute   Fibroids    History of kidney stones    History of recent blood transfusion 02/26/2020   Hypertension    Iron deficiency anemia    Non-ischemic cardiomyopathy (HCC)    tachycardia induced   Obese    Peripheral edema    Premature ventricular contractions (PVCs) ([facility])    Umbilical hernia    Wears glasses     Current Outpatient Medications  Medication Sig Dispense Refill   amLODipine (NORVASC) 10 MG tablet Take 1 tablet (10 mg total) by mouth daily. 30 tablet 0   aspirin 81 MG chewable tablet Chew  1 tablet (81 mg total) by mouth daily. 360 tablet 0   atorvastatin (LIPITOR) 80 MG tablet Take 1 tablet (80 mg total) by mouth every evening. 90 tablet 0   calcium-vitamin D (OSCAL 500/200 D-3) 500-200 MG-UNIT tablet Take 1 tablet by mouth daily with breakfast. 90 tablet 0   carvedilol (COREG) 6.25 MG tablet Take 1 tablet (6.25 mg total) by mouth 2 (two) times daily with a meal. 30 tablet 0   dabigatran (PRADAXA) 150 MG CAPS capsule Take 1 capsule (150 mg total) by mouth every 12 (twelve) hours. 60 capsule 0   dapagliflozin propanediol (FARXIGA) 10 MG TABS tablet Take 1 tablet (10 mg total) by mouth daily before breakfast. 30 tablet 6   glucose blood test strip Use as instructed 100 each 12   hydrALAZINE (APRESOLINE) 100 MG tablet Take 0.5 tablets (50 mg total) by mouth 3 (three) times daily. 45 tablet 0   insulin aspart (NOVOLOG) 100 UNIT/ML injection Substitute to any brand approved.Before each meal 3 times a day, 140-199 - 2 units,  200-250 - 4 units, 251-299 - 6 units,  300-349 - 8 units,  350 or above 10 units. Dispense syringes and needles as needed, Ok to switch to PEN if approved. DX DM2, Code E11.65 10 mL 0   isosorbide mononitrate (IMDUR) 60 MG 24 hr tablet Take 60 mg by mouth daily.     Lancets (ONETOUCH ULTRASOFT) lancets Check CBG twice a day 60 each 5   LANTUS 100 UNIT/ML injection INJECT 0.2 MLS (20 UNITS TOTAL) INTO THE SKIN EVERY MORNING. 10 mL 0   mexiletine (MEXITIL) 150 MG capsule TAKE 2 CAPSULES (300 MG TOTAL) BY MOUTH EVERY 12 (TWELVE) HOURS. 120 capsule 6   Multiple Vitamins-Minerals (WOMENS MULTI PO) Take 1 tablet by mouth daily.     potassium chloride (KLOR-CON) 10 MEQ tablet Take 10 mEq by mouth daily.     TRUEPLUS INSULIN SYRINGE 30G X 5/16" 0.5 ML MISC USE TO INJECT 12 UNITS AT BEDTIME. 100 each 6   No current facility-administered medications for this encounter.    There were no vitals filed for this visit. Wt Readings from Last 3 Encounters:  01/20/21  104 kg  01/06/21 104.1 kg  01/03/21 102.5 kg    PHYSICAL EXAM: General:  Well appearing, moderately obese. No resp difficulty HEENT: normal Neck: supple. no JVD. Carotids 2+ bilat; no bruits. No lymphadenopathy or thryomegaly appreciated. Cor: PMI nondisplaced. Irregular rate & rhythm. No rubs, gallops or murmurs. Lungs: clear Abdomen: obese soft, nontender, nondistended. No hepatosplenomegaly. No bruits or masses. Good bowel sounds. Extremities: no cyanosis, clubbing, rash, trace edema Neuro: alert & orientedx3, cranial nerves grossly intact. moves all 4 extremities w/o difficulty. Affect pleasant  EKG: Ventricular bigeminy 87 bpm  Personally reviewed   ASSESSMENT & PLAN: 1. Chronic  Systolic Heart Failure ECHO 10/2019  EF 20-25%, pericardial effusion, mild LVH, RA/LA dilated.  RV . Possible Tachy Mediated cardiomyopathy verus PVC. She has not had LHC due to elevated creatinine. Had [place] 11/23 with preserved cardiac output.  - Echo 2/21 EF 40-45% (Hard to assess with PVCs) RV mild HK. No effusion Personally reviewed - Echo 1/22: EF 45-50%. RV mildly reduced  - NYHA Class II, Euvolemic  - Continue carvedilol 25 bid - Has not been on spiro/dig/arb with CKD IIB-IV. Recent creatinine 1.9  - C/w Farxiga 10 mg daily  - Check BMP, if SCr improves consider future addition of ARB  - Continue hydralazine 50 mg three times a day + imdur 60 mg daily.    2. Frequent PVCs  - Failed amio - On mexilitene 300 mg bid - Zio Patch 11/2019 Frequent PVCs (19.0%, 42278) with two predominant morphologies (9.8 & 9.3%, respectively).  - Repeat Zio patch 06/2020 w/ 33% PVC burden - Discussed need to f/u w/ EP to discuss PVC ablation      3. PAF  - Maintaining NSR.  - Continue carvedilol 6.25 mg bid  - Had CVA w/ Eliquis, now on Pradaxa   4. CKD 3b - recent creatinine baseline SCr ~1.8 - Follows with Nephrology - Check BMP   5. HTN  - controlled on current regimen   7. Type 2 DM - Continue  Farxiga  8. Uterine Fibroids  -s/p total abdominal hysterectomy and bilateral salpingoopherectomy  9. Possible OSA with snoring - needs sleep study - complicated by lack of health insurance - can order when she gets Medicaid (currenlty pending coverage)   10. CVA - recent CVA 1/21 in setting of Afib - Failed Eliquis, now on Pradaxa  11. COVID 19 infection  - 1/22, recovered treated w/ steroids and Remdesivir  F/u in 3 months w/ Dr. Odelia Gage PA-C   8:50 AM

## 2021-01-22 NOTE — Patient Instructions (Signed)
DECREASE Hydralazine to 50 mg (one half tab) three times per day  Your physician recommends that you schedule a follow-up appointment in: 3-4 weeks with pharmacy team   Your physician recommends that you schedule a follow-up appointment in: 3 months with Dr [name]   Do the following things EVERYDAY: 1) Weigh yourself in the morning before breakfast. Write it down and keep it in a log. 2) Take your medicines as prescribed 3) Eat low salt foods--Limit salt (sodium) to 2000 mg per day.  4) Stay as active as you can everyday 5) Limit all fluids for the day to less than 2 liters  If you have any questions or concerns before your next appointment please send [place] a message through [place] or call our office at [phone].    TO LEAVE A MESSAGE FOR THE NURSE SELECT OPTION 2, PLEASE LEAVE A MESSAGE INCLUDING: . YOUR NAME . DATE OF BIRTH . CALL BACK NUMBER . REASON FOR CALL**this is important as we prioritize the call backs  [place] AS LONG AS YOU CALL BEFORE 4:00 PM  At the [facility], you and your health needs are our priority. As part of our continuing mission to provide you with exceptional heart care, we have created designated Provider Care Teams. These Care Teams include your primary Cardiologist (physician) and Advanced Practice Providers (APPs- Physician Assistants and Nurse Practitioners) who all work together to provide you with the care you need, when you need it.   You may see any of the following providers on your designated Care Team at your next follow up: Marland Kitchen Dr [name] . Dr Loralie Champagne . Darrick Grinder, NP . Lyda Jester, [place] . Audry Riles, PharmD   Please be sure to bring in all your medications bottles to every appointment.

## 2021-01-23 ENCOUNTER — Encounter (HOSPITAL_COMMUNITY): Payer: Self-pay

## 2021-01-27 MED FILL — MEXILETINE 150 MG CAPSULE: 150 | 30 days supply | Qty: 120 | Fill #1

## 2021-01-27 NOTE — Progress Notes (Signed)
Normal result letter generated and mailed to address on file/sent via MyChart.

## 2021-01-29 ENCOUNTER — Ambulatory Visit: Payer: Self-pay | Admitting: Internal Medicine

## 2021-02-02 ENCOUNTER — Encounter: Payer: Self-pay | Admitting: Occupational Therapy

## 2021-02-02 ENCOUNTER — Ambulatory Visit: Payer: Self-pay | Attending: Occupational Therapy

## 2021-02-02 ENCOUNTER — Ambulatory Visit: Payer: Self-pay | Admitting: Occupational Therapy

## 2021-02-02 ENCOUNTER — Other Ambulatory Visit: Payer: Self-pay

## 2021-02-02 DIAGNOSIS — M25512 Pain in left shoulder: Secondary | ICD-10-CM

## 2021-02-02 DIAGNOSIS — M25612 Stiffness of left shoulder, not elsewhere classified: Secondary | ICD-10-CM | POA: Insufficient documentation

## 2021-02-02 DIAGNOSIS — M6281 Muscle weakness (generalized): Secondary | ICD-10-CM | POA: Insufficient documentation

## 2021-02-02 DIAGNOSIS — R278 Other lack of coordination: Secondary | ICD-10-CM | POA: Insufficient documentation

## 2021-02-02 DIAGNOSIS — R2689 Other abnormalities of gait and mobility: Secondary | ICD-10-CM

## 2021-02-02 DIAGNOSIS — R262 Difficulty in walking, not elsewhere classified: Secondary | ICD-10-CM | POA: Insufficient documentation

## 2021-02-02 DIAGNOSIS — R41842 Visuospatial deficit: Secondary | ICD-10-CM

## 2021-02-02 DIAGNOSIS — R2681 Unsteadiness on feet: Secondary | ICD-10-CM | POA: Insufficient documentation

## 2021-02-02 NOTE — Therapy (Signed)
[facility] [address] [place], [place], [address] Phone: [phone]   Fax:  [phone]  Physical Therapy Evaluation  Patient Details  Name: Bluma Buresh MRN: [id number] Date of Birth: 25-Jan-1970 Referring Provider (PT): Referred by: Thurnell Lose, MD. PCP is Nicolette Bang, DO   Encounter Date: 02/02/2021   PT End of Session - 02/02/21 1318    Visit Number 1    Number of Visits 9    Date for PT Re-Evaluation --   Re-Cert on 9th Visit   Authorization Type Medicaid (Pending)    PT Start Time 1230    PT Stop Time 1315    PT Time Calculation (min) 45 min    Equipment Utilized During Treatment Gait belt    Activity Tolerance Patient tolerated treatment well    Behavior During Therapy [facility] for tasks assessed/performed           Past Medical History:  Diagnosis Date  . Atrial fibrillation with RVR ([place])   . [place] 01/2020  . CHF (congestive heart failure) ([place]) 10/20/2019  . CKD (chronic kidney disease), stage IV ([place])   . Diabetes mellitus without complication ([place])   . DOE (dyspnea on exertion)    walking upstairs or up hill resolves in one minute  . Fibroids   . History of kidney stones   . History of recent blood transfusion 02/26/2020  . Hypertension   . Iron deficiency anemia   . Non-ischemic cardiomyopathy (H[facility])    tachycardia induced  . Obese   . Peripheral edema   . Premature ventricular contractions (PVCs) (VPCs)   . Umbilical hernia   . Wears glasses     Past Surgical History:  Procedure Laterality Date  . CHOLECYSTECTOMY    . CYSTOSCOPY W/ URETERAL STENT PLACEMENT Bilateral 05/24/2020   Procedure: CYSTOSCOPY WITH RETROGRADE PYELOGRAM/URETERAL STENT PLACEMENT;  Surgeon: Robley Fries, MD;  Location: [facility];  Service: Urology;  Laterality: Bilateral;  . CYSTOSCOPY/RETROGRADE/URETEROSCOPY Bilateral 04/03/2020   Procedure: CYSTOSCOPY/RETROGRADE/URETEROSCOPY;  Surgeon:  Ardis Hughs, MD;  Location: [facility];  Service: Urology;  Laterality: Bilateral;  . HYSTERECTOMY ABDOMINAL WITH SALPINGO-OOPHORECTOMY  07/21/2020   Procedure: HYSTERECTOMY ABDOMINAL WITH BILATERAL SALPINGO-OOPHORECTOMY;  Surgeon: Sanjuana Kava, MD;  Location: [facility];  Service: Gynecology;;  . IR FLUORO GUIDE CV LINE RIGHT  02/21/2020  . IR US GUIDE VASC ACCESS RIGHT  02/21/2020  . NEPHROLITHOTOMY Left 02/14/2020   Procedure: NEPHROLITHOTOMY PERCUTANEOUS/ SURGEON ACCESS/ LEFT PERCUTANEOUS NEPHROSTOMY TUBE PLACEMENT;  Surgeon: Ardis Hughs, MD;  Location: [facility];  Service: Urology;  Laterality: Left;  . NEPHROLITHOTOMY Left 02/21/2020   Procedure: NEPHROLITHOTOMY PERCUTANEOUS SECOND LOOK;  Surgeon: Ardis Hughs, MD;  Location: [facility];  Service: Urology;  Laterality: Left;  . NEPHROLITHOTOMY Left 02/26/2020   Procedure: NEPHROLITHOTOMY PERCUTANEOUS;  Surgeon: Ceasar Mons, MD;  Location: [facility];  Service: Urology;  Laterality: Left;  NEED 150 MIN  . NEPHROLITHOTOMY Right 03/24/2020   Procedure: NEPHROLITHOTOMY PERCUTANEOUS WITH ACCESS LEFT STENT REMOVAL;  Surgeon: Ardis Hughs, MD;  Location: [facility];  Service: Urology;  Laterality: Right;  . RIGHT HEART CATH N/A 11/09/2019   Procedure: RIGHT HEART CATH;  Surgeon: Jolaine Artist, MD;  Location: [facility];  Service: Cardiovascular;  Laterality: N/A;  . WISDOM TOOTH EXTRACTION      There were no vitals filed for this visit.    Subjective Assessment - 02/02/21 1234    Subjective Presented 12/24/2020 with left-sided weakness  facial droop and slurred speech.  CT/MRI showed acute infarct right hemipons as well as additional small acute infarct left frontal subcortical white matter.  Patient recieved Inpatient Rehab, but Pt found to be COVID+ upon admission and was re-hospitilized from 1/15-1/18.  Patient was discharged home on 01/06/21. Patient has residual left sided weakness. Patient was quarantined at home, and then  was re-tested and it come back negative. Patient reports she has been getting around the house well without an AD. Patient ambulating with SPC to session, but reports is only using it because her husband told her. Patient reports no falls since discharge. Patient reports she is getting fatigued easier now than she was prior to stroke.    Pertinent History CVA, Diabetes, CKD Stage 3, A. fib, CHF, HTN, , Normocytic anemia, Hyperlipidemia, COVID - 19    Limitations Standing;Walking;House hold activities    How long can you walk comfortably? 10 minutes    Patient Stated Goals get strength back in LLE    Currently in Pain? Yes    Pain Score 4     Pain Location Shoulder    Pain Orientation Left    Pain Descriptors / Indicators Aching    Pain Type Acute pain    Aggravating Factors  reaching overhead    Pain Relieving Factors rest              [facility] PT Assessment - 02/02/21 0001      Assessment   Medical Diagnosis [place] CVA    Referring Provider (PT) Referred by: Thurnell Lose, MD. PCP is Nicolette Bang, DO    Onset Date/Surgical Date 12/24/20   date of ED admission   Hand Dominance Right    Prior Therapy CIR      Precautions   Precautions Fall      Restrictions   Weight Bearing Restrictions No      Balance Screen   Has the patient fallen in the past 6 months No    Has the patient had a decrease in activity level because of a fear of falling?  No    Is the patient reluctant to leave their home because of a fear of falling?  No      Home Ecologist residence    Living Arrangements Spouse/significant other    Available Help at Discharge Family    Type of [place] to enter    Entrance Stairs-Number of Steps [place] One level    [place] - single point;Toilet riser      Prior Function   Level of Independence Independent    Vocation Unemployed     [place]; Going for walks outdoors; Shopping      Cognition   Overall Cognitive Status Within Functional Limits for tasks assessed      Sensation   Light Touch Appears Intact    Additional Comments for BLE's      Coordination   Gross Motor Movements are Fluid and Coordinated No    Coordination and Movement Description impaired due to mild hemiparesis on LLE    Heel Shin Test impaired L side      ROM / Strength   AROM / PROM / Strength Strength      Strength   Overall Strength Deficits    Strength Assessment Site Hip;Knee;Ankle    Right/Left Hip Right;Left  Right Hip Flexion 4-/5    Right Hip ABduction 4-/5    Right Hip ADduction 4-/5    Left Hip Flexion 3-/5    Left Hip ABduction 3/5    Left Hip ADduction 4-/5    Right/Left Knee Right;Left    Right Knee Flexion 5/5    Right Knee Extension 5/5    Left Knee Flexion 3+/5    Left Knee Extension 4-/5    Right/Left Ankle Right;Left    Right Ankle Dorsiflexion 4/5    Left Ankle Dorsiflexion 3+/5      Bed Mobility   Bed Mobility --   reports independence, but able to complete independently     Transfers   Transfers Sit to Stand;Stand to Sit    Sit to Stand 5: Supervision    Five time sit to stand comments  24.72 secs with UE support    Stand to Sit 5: Supervision      Ambulation/Gait   Ambulation/Gait Yes    Ambulation/Gait Assistance 5: Supervision;4: Min guard    Ambulation/Gait Assistance Details completed ambulation x 115 ft without AD close CGA from PT for safety. decreased toe clearance noted on LLE. PT educating to continue to use [facility] outside of home for improved safety, adjusted to proper height for patient.    Ambulation Distance (Feet) 115 Feet    Assistive device None    Gait Pattern Step-through pattern;Decreased arm swing - left;Decreased stance time - left;Decreased dorsiflexion - left;Decreased weight shift to left;Poor foot clearance - left    Ambulation Surface Level;Indoor    Gait velocity 13.58  secs = 2.42 ft/sec   without AD   Stairs Yes    Stairs Assistance 5: Supervision;4: Min guard    Stairs Assistance Details (indicate cue type and reason) completed ascend/descend with single rail on R side with step to pattern. patient turns to side to descend    Stair Management Technique One rail Right;Step to pattern;Forwards    Number of Stairs 4    Height of Stairs 6      Standardized Balance Assessment   Standardized Balance Assessment Timed Up and Go Test;Berg Balance Test      Berg Balance Test   Sit to Stand Able to stand  independently using hands    Standing Unsupported Able to stand safely 2 minutes    Sitting with Back Unsupported but Feet Supported on Floor or Stool Able to sit safely and securely 2 minutes    Stand to Sit Sits safely with minimal use of hands    Transfers Able to transfer safely, minor use of hands    Standing Unsupported with Eyes Closed Able to stand 10 seconds with supervision    Standing Unsupported with Feet Together Able to place feet together independently and stand for 1 minute with supervision    From Standing, Reach Forward with Outstretched Arm Can reach confidently >25 cm (10")    From Standing Position, Pick up Object from [place] to pick up shoe safely and easily    From Standing Position, Turn to Look Behind Over each Shoulder Looks behind one side only/other side shows less weight shift    Turn 360 Degrees Able to turn 360 degrees safely but slowly    Standing Unsupported, Alternately Place Feet on Step/Stool Able to stand independently and complete 8 steps >20 seconds    Standing Unsupported, One Foot in Front Needs help to step but can hold 15 seconds    Standing on One Leg  Tries to lift leg/unable to hold 3 seconds but remains standing independently    Total Score 43    Berg comment: 43/56 = High Fall Risk      Timed Up and Go Test   TUG Normal TUG    Normal TUG (seconds) 12.25    TUG Comments completed without AD, close  supervision/CGA                      Objective measurements completed on examination: See above findings.               PT Education - 02/02/21 1233    Education Details Patient educated on POC/evaluation findings. Continued use of SPC for safety within community    Person(s) Educated Patient    Methods Explanation    Comprehension Verbalized understanding            PT Short Term Goals - 02/02/21 1356      PT SHORT TERM GOAL #1   Title Patient will be independent with initial HEP for balance/strengthening (STGs Due at 5th Visit)    Baseline No HEP established    Time 4    Period --   visits   Status New      PT SHORT TERM GOAL #2   Title Patient will improve 5x sit <> stand to </= 20 seconds to demonstrate improved balance    Baseline 24.72    Time 4    Period --   visits   Status New      PT SHORT TERM GOAL #3   Title Patient will imrpove Berg Balance to >/= 46/56 to demonstrate reduced fall risk and improved balance    Baseline 43/56    Time 4    Period --   visits   Status New      PT SHORT TERM GOAL #4   Title Patient will demo ability to ambulate >/= 500 ft with supervision without AD to demonstrate improved household mobility    Baseline 115 ft with supervision/CGA    Time 4    Period --   visits   Status New             PT Long Term Goals - 02/02/21 1453      PT LONG TERM GOAL #1   Title Patient will be independent with Final HEP for strengthening/balance (ALL LTGs Due at 9th Visit)    Baseline no HEP established    Time 8    Period --   visits   Status New      PT LONG TERM GOAL #2   Title Patient will improve TUG to </= 10 seconds to demo improved balance and reduced fall risk    Baseline 12.25 secs    Time 8    Period --   visits   Status New      PT LONG TERM GOAL #3   Title Patient will improve Berg Balance to >/= 50/56 to demonstrate decreased fall risk and improved balance    Baseline 43/56    Time 8     Period --   visits   Status New      PT LONG TERM GOAL #4   Title Patient will improve gait speed to >/= 2.75 ft/sec to demonstrate improved community ambulation    Baseline 2.42 ft/sec    Time 8    Period --   visits   Status New  PT LONG TERM GOAL #5   Title Patient will improve 5x sit <> stand to </= 15 seconds to demonstrate reduce fall risk and improved balance    Baseline 24.72 secs    Time 8    Period --   visits   Status New      Additional Long Term Goals   Additional Long Term Goals Yes      PT LONG TERM GOAL #6   Title Patient will demo abilit yto ambulate >/= 500 ft on various outdoor surfaces for improved community mobility    Baseline TBA    Time 8    Period --   visits   Status New                  Plan - 02/02/21 1350    Clinical Impression Statement Patient is a 55 y.o. female that was referred to Neuro OPPT services for [place] CVA. Patient's PMH is significant for the following: CVA, Diabetes, CKD Stage 3, A. fib, CHF, HTN, , Normocytic anemia, Hyperlipidemia, COVID - 19. Upon evaluation patient presents with the following impairments: abnormal gait, decreased strength, impaired coordination, impaired balance, decreased activity tolerance, and increased risk for falls. Patient is currently ambulating at 2.42 ft/sec without AD demonstrating limited community ambulator. With Berg Balance score of 43/56 and TUG of 12.25 secs patient is at increased risk for fall at this time. Patient will benefit from skilled PT services to address impairments and maximize functional mobility    Personal Factors and Comorbidities Comorbidity 3+    Comorbidities CVA, Diabetes, CKD Stage 3, A. fib, CHF, HTN, , Normocytic anemia, Hyperlipidemia, COVID - 19    Examination-Activity Limitations Bed Mobility;Transfers;Stand;Stairs    Examination-Participation Restrictions Church;Community Activity;Laundry;Cleaning    Stability/Clinical Decision Making Evolving/Moderate  complexity    Clinical Decision Making Moderate    Rehab Potential Good    PT Frequency 1x / week    PT Duration 8 weeks   plus eval   PT Treatment/Interventions ADLs/Self Care Home Management;Aquatic Therapy;Cryotherapy;DME Instruction;Moist Heat;Electrical Stimulation;Gait training;Stair training;Functional mobility training;Therapeutic activities;Balance training;Therapeutic exercise;Neuromuscular re-education;Patient/family education;Orthotic Fit/Training;Passive range of motion;Vestibular;Manual techniques    PT Next Visit Plan Initiate HEP focused on Strengthening/Balance. Assess gait with foot up brace on LLE?    Recommended Other Services Occupational Therapy    Consulted and Agree with Plan of Care Patient           Patient will benefit from skilled therapeutic intervention in order to improve the following deficits and impairments:  Abnormal gait,Decreased coordination,Difficulty walking,Decreased range of motion,Decreased activity tolerance,Decreased balance,Decreased knowledge of use of DME,Decreased mobility,Decreased strength,Decreased endurance,Decreased safety awareness  Visit Diagnosis: Difficulty in walking, not elsewhere classified  Muscle weakness (generalized)  Other abnormalities of gait and mobility  Unsteadiness on feet     Problem List Patient Active Problem List   Diagnosis Date Noted  . Febrile illness, acute 01/03/2021  . Slow transit constipation   . Sore throat   . Irregular cardiac rhythm   . PAF (paroxysmal atrial fibrillation) ([place])   . Labile blood pressure   . Hypokalemia   . Labile blood glucose   . Poorly controlled type 2 diabetes mellitus with peripheral neuropathy ([place])   . Stage 3b chronic kidney disease ([place])   . Right [place]ine cerebrovascular accident ([place]) 12/27/2020  . Acute CVA (cerebrovascular accident) ([place]) 12/24/2020  . Hyperlipidemia 10/24/2020  . Vitamin D deficiency 10/24/2020  . Fibroids 07/21/2020  . S/P TAH (total  abdominal hysterectomy) 07/21/2020  .  Chronic a-fib ([place]) 05/23/2020  . Hydroureter on right 05/23/2020  . Microalbuminuria 05/13/2020  . Non-ischemic cardiomyopathy ([place])   . Normocytic anemia   . Nephrolithiasis 02/14/2020  . Paroxysmal atrial fibrillation ([place]) 12/18/2019  . 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 Gross hematuria 12/18/2019  . Staghorn renal calculus 12/18/2019  . Umbilical hernia without obstruction and without gangrene 12/18/2019  . Uterine fibroid 10/25/2019  . Right hip pain   . Type 2 diabetes mellitus ([place])   . Essential hypertension   . CHF (congestive heart failure) ([place]) 10/21/2019  . Atrial fibrillation with RVR ([place]) 10/21/2019  . Atrial fibrillation with rapid ventricular response ([place]) 10/20/2019    Jones Bales, PT, DPT 02/02/2021, 2:57 PM  [facility] [address] [place], [place], [address] Phone: [phone]   Fax:  [phone]  Name: Takiyah Bohnsack MRN: [id number] Date of Birth: 20-Feb-1966

## 2021-02-02 NOTE — Therapy (Signed)
[facility] [address] [place], [place], [address] Phone: [phone]   Fax:  [phone]  Occupational Therapy Evaluation  Patient Details  Name: Adrienne Lane MRN: [id number] Date of Birth: 1966/12/19 Referring Provider (OT): Lala Lund, MD   Encounter Date: 02/02/2021   OT End of Session - 02/02/21 1253    Visit Number 1    Number of Visits 9    Date for OT Re-Evaluation 03/30/21    Authorization Type Medicaid Pending    OT Start Time 1318    OT Stop Time 1400    OT Time Calculation (min) 42 min    Activity Tolerance Patient tolerated treatment well    Behavior During Therapy [facility] for tasks assessed/performed           Past Medical History:  Diagnosis Date  . Atrial fibrillation with RVR ([employer])   . [place] 01/2020  . CHF (congestive heart failure) ([place]) 10/20/2019  . CKD (chronic kidney disease), stage IV ([place])   . Diabetes mellitus without complication ([place])   . DOE (dyspnea on exertion)    walking upstairs or up hill resolves in one minute  . Fibroids   . History of kidney stones   . History of recent blood transfusion 02/26/2020  . Hypertension   . Iron deficiency anemia   . Non-ischemic cardiomyopathy (HCC)    tachycardia induced  . Obese   . Peripheral edema   . Premature ventricular contractions (PVCs) ([facility])   . Umbilical hernia   . Wears glasses     Past Surgical History:  Procedure Laterality Date  . CHOLECYSTECTOMY    . CYSTOSCOPY W/ URETERAL STENT PLACEMENT Bilateral 05/24/2020   Procedure: CYSTOSCOPY WITH RETROGRADE PYELOGRAM/URETERAL STENT PLACEMENT;  Surgeon: Robley Fries, MD;  Location: [facility];  Service: Urology;  Laterality: Bilateral;  . CYSTOSCOPY/RETROGRADE/URETEROSCOPY Bilateral 04/03/2020   Procedure: CYSTOSCOPY/RETROGRADE/URETEROSCOPY;  Surgeon: Ardis Hughs, MD;  Location: [facility];  Service: Urology;  Laterality: Bilateral;  . HYSTERECTOMY ABDOMINAL WITH  SALPINGO-OOPHORECTOMY  07/21/2020   Procedure: HYSTERECTOMY ABDOMINAL WITH BILATERAL SALPINGO-OOPHORECTOMY;  Surgeon: Sanjuana Kava, MD;  Location: [facility];  Service: Gynecology;;  . IR FLUORO GUIDE CV LINE RIGHT  02/21/2020  . IR US GUIDE VASC ACCESS RIGHT  02/21/2020  . NEPHROLITHOTOMY Left 02/14/2020   Procedure: NEPHROLITHOTOMY PERCUTANEOUS/ SURGEON ACCESS/ LEFT PERCUTANEOUS NEPHROSTOMY TUBE PLACEMENT;  Surgeon: Ardis Hughs, MD;  Location: [facility];  Service: Urology;  Laterality: Left;  . NEPHROLITHOTOMY Left 02/21/2020   Procedure: NEPHROLITHOTOMY PERCUTANEOUS SECOND LOOK;  Surgeon: Ardis Hughs, MD;  Location: [facility];  Service: Urology;  Laterality: Left;  . NEPHROLITHOTOMY Left 02/26/2020   Procedure: NEPHROLITHOTOMY PERCUTANEOUS;  Surgeon: Ceasar Mons, MD;  Location: [facility];  Service: Urology;  Laterality: Left;  NEED 150 MIN  . NEPHROLITHOTOMY Right 03/24/2020   Procedure: NEPHROLITHOTOMY PERCUTANEOUS WITH ACCESS LEFT STENT REMOVAL;  Surgeon: Ardis Hughs, MD;  Location: [facility];  Service: Urology;  Laterality: Right;  . RIGHT HEART CATH N/A 11/09/2019   Procedure: RIGHT HEART CATH;  Surgeon: Jolaine Artist, MD;  Location: [facility];  Service: Cardiovascular;  Laterality: N/A;  . WISDOM TOOTH EXTRACTION      There were no vitals filed for this visit.   Subjective Assessment - 02/02/21 1434    Subjective  Pt reports some pain in LUE shoulder with movement - 5/10 with flexion and 2/10 with abduction. Pt reports she has left sided weakness and wants to get  stronger in her hand. Pt reports trying to make herself use her left hand more and is participating in ADLs and IADLs at home.    Pertinent History R Pontine CVA, DM, HTN, CKD, CHF    Limitations fall risk. not driving.    Patient Stated Goals get stronger and get back to normal    Currently in Pain? Yes    Pain Score 5     Pain Location Shoulder    Pain Orientation Left    Pain Descriptors /  Indicators Aching    Pain Type Acute pain    Pain Onset More than a month ago    Pain Frequency Intermittent    Aggravating Factors  shoulder fleixon             [facility] OT Assessment - 02/02/21 1251      Assessment   Medical Diagnosis Pontine CVA    Referring Provider (OT) Lala Lund, MD    Onset Date/Surgical Date 12/24/20    Hand Dominance Right    Prior Therapy Inpatient Rehab      Precautions   Precautions Fall      Restrictions   Weight Bearing Restrictions No      Balance Screen   Has the patient fallen in the past 6 months No      Home  Environment   Family/patient expects to be discharged to: Private residence    Living Arrangements Spouse/significant other   mother in law and son live with her as well   Available Help at Discharge Family    Type of [place] to live on main level with bedroom/bathroom   one story   Bathroom Shower/Tub Tub/Shower unit    Biochemist, clinical Standard   BSC over [place] --   husband is going to get shower seat but not yet     Prior Function   Level of Independence Independent    Vocation Unemployed    Leisure church, shopping, grandkids      ADL   Eating/Feeding Modified independent    Grooming Minimal assistance   needs assistance with washing hair - still goes to [employer] Bathing Modified independent   sponge bathe at this time   Lower Body Bathing Modified independent   sponge bathe at this time   Upper Body Dressing Independent    Lower Body Dressing Increased time   takes longer to tie shoes   Toilet Transfer Modified independent    Toileting - Clothing Manipulation Modified independent    [place];Increase time    Tub/Shower Transfer Other (comment)   not completing fully at this time. no shower seat - completing sponge bathes at edge of tub     IADL   Prior Level of Function Shopping independent     Shopping Needs to be accompanied on any shopping trip    Prior Level of Function Light Housekeeping shared responsibility with husband    Light Housekeeping Performs light daily tasks such as dishwashing, bed making;Needs help with all home maintenance tasks    Prior Level of Function Meal Prep independent    Meal Prep Prepares adequate meal if supplied with ingredients;Plans, prepares and serves adequate meals independently    Prior Level of Function Community Mobility drove prior    [employer] on family or friends for transportation  Prior Level of Function Medication Managment independent    Medication Management Is responsible for taking medication in correct dosages at correct time   still having some difficulty with insulin   Prior Level of Function Financial Management husband managed prior    Financial Management Dependent;Requires assistance      Written Expression   Dominant Hand Right    Written Experience --   gets sloppy at times     Vision - History   Baseline Vision Wears glasses for distance only    Additional Comments reports some blurriness on occassion for near point things      Vision Assessment   Eye Alignment Impaired (comment)   left eye sits superiorly   Ocular Range of Motion Within Functional Limits    Tracking/Visual Pursuits Unable to hold eye position out of midline   only to left   Saccades Undershoots   left eye   Convergence Within functional limits    Visual Fields No apparent deficits      Activity Tolerance   Activity Tolerance Tolerates 10-20 min activity with multiple rests      Sensation   Light Touch Appears Intact    Hot/Cold Appears Intact    Additional Comments BUE      Coordination   9 Hole Peg Test Right;Left    Right 9 Hole Peg Test 24.5    Left 9 Hole Peg Test 68.37    Box and Blocks RUE 52; LUE 36      ROM / Strength   AROM / PROM / Strength Strength;AROM      AROM   Overall AROM  Deficits    Overall  AROM Comments RUE WFL; LUE Deficits    AROM Assessment Site Shoulder    Right/Left Shoulder Left    Left Shoulder Flexion 115 Degrees    Left Shoulder ABduction 95 Degrees      Strength   Overall Strength Deficits    Overall Strength Comments RUE WFL; LUE Deficits    Strength Assessment Site Elbow;Shoulder    Right/Left Shoulder Left    Left Shoulder Flexion 3+/5    Left Shoulder Extension 3+/5    Left Shoulder ABduction 3+/5    Right/Left Elbow Left    Left Elbow Flexion 3+/5    Left Elbow Extension 3+/5      Hand Function   Right Hand Gross Grasp Functional    Right Hand Grip (lbs) 44.7    Left Hand Gross Grasp Functional   feels tight with flexion, nodule present at 4th digit volarly   Left Hand Grip (lbs) 13.8                           OT Education - 02/02/21 1253    Education Details Education provided on role and purpose of OT    Person(s) Educated Patient    Methods Explanation    Comprehension Verbalized understanding            OT Short Term Goals - 02/02/21 1438      OT SHORT TERM GOAL #1   Title Pt will be independent with coordination HEP for LUE 03/02/2021    Baseline not issued yet    Time 4    Period Weeks    Status New    Target Date 03/02/21      OT SHORT TERM GOAL #2   Title Pt will demonstrate improved LUE functional use by improving Box and  Blocks score to 42 or greater.    Baseline RUE 52 LUE 36    Time 4    Period Weeks    Status New      OT SHORT TERM GOAL #3   Title Pt will improve grip strength in LUE to 18 lbs or greater for increase for clothing management and overall functional use.    Baseline RUE 44.7 LUE 13.8    Time 4    Period Weeks    Status New      OT SHORT TERM GOAL #4   Title Pt will report increased speed and accuracy with basic ADLs (i.e. tying shoes, washing up, etc)    Time 4    Period Weeks    Status New             OT Long Term Goals - 02/02/21 1442      OT LONG TERM GOAL #1   Title  Pt will be independent with updated HEP for LUE strengthening and coordination    Baseline not issued yet    Time 8    Period Weeks    Status New    Target Date 03/30/21      OT LONG TERM GOAL #2   Title Pt will improve LUE fine motor coordination by completing 9 hole peg test with LUE in 50 seconds or less    Baseline LUE 68.37, RUE 24.5    Time 8    Period Weeks    Status New      OT LONG TERM GOAL #3   Title Pt will improve grip strength in LUE to 25 lbs or greater for increasing overall functional use of LUE.    Baseline LUE 13.8 RUE 44.7    Time 8    Period Weeks    Status New      OT LONG TERM GOAL #4   Title Pt will improve standing tolerance to 20 minutes or greater in order to increase ability to complete home management tasks with improved activity tolerance    Baseline 10 minutes before requiring break    Time 8    Period Weeks    Status New                 Plan - 02/02/21 1418    Clinical Impression Statement Pt is a 55 year old female that presents to Neuro OPOT s/p Right pontine CVA on 12/27/2020. PMH Afib, DM, CHF, CKD, HTN. Pt presents with left sided weakness including decreased range of motion and grip strengthening and fine motor coordination in LUE. Pt has some vision changes. Pt with overall decreaed functional use of LUE and decline in independence with ADLs and IADLs. Skilled occupational therapy is recommended to target listed areas of deficit and increase independence and decrease caregiver burden.    OT Occupational Profile and History Problem Focused Assessment - Including review of records relating to presenting problem    Occupational performance deficits (Please refer to evaluation for details): ADL's;IADL's;Leisure    Body Structure / Function / Physical Skills ADL;IADL;Coordination;FMC;ROM;UE functional use;GMC;Vision;Strength;Dexterity;Decreased knowledge of use of DME;Balance;Pain    Rehab Potential Good    Clinical Decision Making Limited  treatment options, no task modification necessary    Comorbidities Affecting Occupational Performance: None    Modification or Assistance to Complete Evaluation  No modification of tasks or assist necessary to complete eval    OT Frequency 1x / week    OT Duration 8 weeks  8 visits + eval   OT Treatment/Interventions Self-care/ADL training;Cryotherapy;Fluidtherapy;DME and/or AE instruction;Passive range of motion;Manual Therapy;Functional Mobility Training;Neuromuscular education;Moist Heat;Therapeutic exercise;Patient/family education;Therapeutic activities;Visual/perceptual remediation/compensation;Energy conservation;Paraffin    Plan coordination HEP for LUE, maybe fluido or paraffin for LUE hand (has nodule/knot - more contracture like)    Consulted and Agree with Plan of Care Patient           Patient will benefit from skilled therapeutic intervention in order to improve the following deficits and impairments:   Body Structure / Function / Physical Skills: ADL,IADL,Coordination,FMC,ROM,UE functional use,GMC,Vision,Strength,Dexterity,Decreased knowledge of use of DME,Balance,Pain       Visit Diagnosis: Muscle weakness (generalized) - Plan: Ot plan of care cert/re-cert  Other lack of coordination - Plan: Ot plan of care cert/re-cert  Other abnormalities of gait and mobility - Plan: Ot plan of care cert/re-cert  Unsteadiness on feet - Plan: Ot plan of care cert/re-cert  Visuospatial deficit - Plan: Ot plan of care cert/re-cert  Stiffness of left shoulder, not elsewhere classified - Plan: Ot plan of care cert/re-cert  Acute pain of left shoulder - Plan: Ot plan of care cert/re-cert    Problem List Patient Active Problem List   Diagnosis Date Noted  . Febrile illness, acute 01/03/2021  . Slow transit constipation   . Sore throat   . Irregular cardiac rhythm   . PAF (paroxysmal atrial fibrillation) ([place])   . Labile blood pressure   . Hypokalemia   . Labile blood glucose    . Poorly controlled type 2 diabetes mellitus with peripheral neuropathy ([place])   . Stage 3b chronic kidney disease ([place])   . Right pontine cerebrovascular accident ([place]) 12/27/2020  . Acute CVA (cerebrovascular accident) ([place]) 12/24/2020  . Hyperlipidemia 10/24/2020  . Vitamin D deficiency 10/24/2020  . Fibroids 07/21/2020  . S/P TAH (total abdominal hysterectomy) 07/21/2020  . Chronic a-fib ([place]) 05/23/2020  . Hydroureter on right 05/23/2020  . Microalbuminuria 05/13/2020  . Non-ischemic cardiomyopathy ([place])   . Normocytic anemia   . Nephrolithiasis 02/14/2020  . Paroxysmal atrial fibrillation ([place]) 12/18/2019  . 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 Gross hematuria 12/18/2019  . Staghorn renal calculus 12/18/2019  . Umbilical hernia without obstruction and without gangrene 12/18/2019  . Uterine fibroid 10/25/2019  . Right hip pain   . Type 2 diabetes mellitus ([place])   . Essential hypertension   . CHF (congestive heart failure) ([place]) 10/21/2019  . Atrial fibrillation with RVR ([place]) 10/21/2019  . Atrial fibrillation with rapid ventricular response ([place]) 10/20/2019    Zachery Conch MOT, OTR/L  02/02/2021, 5:46 PM  [facility] [address] [place], [place], [address] Phone: [phone]   Fax:  [phone]  Name: Adrienne Lane MRN: [id number] Date of Birth: [birth year]

## 2021-02-03 ENCOUNTER — Encounter: Payer: Self-pay | Admitting: Adult Health

## 2021-02-03 ENCOUNTER — Inpatient Hospital Stay: Payer: MEDICAID | Admitting: Adult Health

## 2021-02-03 NOTE — Progress Notes (Deleted)
[facility] [address] [place]. [place] [address] [phone]       Kincaid [name] Date of Birth:  06-Jul-1966 Medical Record Number:  [id number]   Reason for Referral:  hospital stroke follow up    SUBJECTIVE:   CHIEF COMPLAINT:  No chief complaint on file.   HPI:   Adrienne Lane is a 55 y.o. female with PMHx of DM, Afib on Eliquis, CKD stage 4, HFpEF who presented to [facility] ED on 12/24/2020 with floaters on OU in peripheral vision along with LUE & LLE weakness and left facial droop.  Personally reviewed hospitalization pertinent progress notes, lab work and imaging with summary provided.  Evaluated by Dr. Leonie Man with stroke work-up revealing right pontine lacunar infarct likely secondary from small vessel disease as well as additional small acute infarct in the left frontal subcortical white matter and small remote lacunar infarcts in the right corona radiata and left parietal white matter.  History of atrial fibrillation compliant on Eliquis and aspirin 81 mg daily with recommended continuation of aspirin 81 mg daily and transition Eliquis to Pradaxa 150 mg twice daily. Hx of HTN stable on amlodipine, carvedilol and hydralazine.  History of HLD on atorvastatin 40 mg daily with LDL 173 therefore increased dosage to 80 mg daily.  Uncontrolled DM with A1c 8.0.  Other stroke risk factors include obesity, CAD, OSA (evaluated by sleep smart trial) and CHF.  Evaluated by therapies who recommended discharge to CIR for ongoing therapy needs on 12/27/2020.  On 01/03/2021 during CIR admission, found to have COVID with low-grade fever, cough and sore throat therefore discharged to acute services for ongoing care and treatment.  She was eventually discharged home on 01/06/2021 (as all family members in household positive for COVID and mild symptoms) as she was unable to return back to CIR unless she had a negative Covid test (which could take 14-21 days).    Stroke - right pontine lacunar infarct likely from small vessel disease, the patient has a history of atrial fibrillation and was compliant with her Eliquis  CT Head: chronic microvascular ischemic disease or age or indeterminate lacunar infarct, Additional age indeterminate lacunar, infarct within the right corona radiata, favored remote  MRI : Acute infarct in the right hemipons. Additional small acute infarct in the left frontal subcortical white matter. Small remote lacunar infarcts in the right corona radiata and left periatrial white matter.  MR angio Head - neg  Carotid US unremarkable 2D Echo: EF 45 to 50%.  LDL 173  HgbA1c 8.0  VTE prophylaxis - On pradaxa  Eliquis (apixaban) daily and ASA 81 prior to admission, now on ASA 81 and Pradaxa 150 mg BID. Continue on discharge  Therapy recommendations:  CIR  Disposition:  CIR  Today, 02/03/2021, Mrs. [name] is being seen for hospital follow-up.    ROS:   14 system review of systems performed and negative with exception of ***  PMH:  Past Medical History:  Diagnosis Date   Atrial fibrillation with RVR ([place])    Bigeminy 01/2020   CHF (congestive heart failure) ([place]) 10/20/2019   CKD (chronic kidney disease), stage IV (HCC)    Diabetes mellitus without complication ([place])    DOE (dyspnea on exertion)    walking upstairs or up hill resolves in one minute   Fibroids    History of kidney stones    History of recent blood transfusion 02/26/2020   Hypertension    Iron deficiency anemia  Non-ischemic cardiomyopathy (HCC)    tachycardia induced   Obese    Peripheral edema    Premature ventricular contractions (PVCs) ([facility])    Umbilical hernia    Wears glasses     PSH:  Past Surgical History:  Procedure Laterality Date   CHOLECYSTECTOMY     CYSTOSCOPY W/ URETERAL STENT PLACEMENT Bilateral 05/24/2020   Procedure: CYSTOSCOPY WITH RETROGRADE PYELOGRAM/URETERAL STENT PLACEMENT;  Surgeon: Robley Fries, MD;  Location: [facility];  Service: Urology;  Laterality: Bilateral;   CYSTOSCOPY/RETROGRADE/URETEROSCOPY Bilateral 04/03/2020   Procedure: CYSTOSCOPY/RETROGRADE/URETEROSCOPY;  Surgeon: Ardis Hughs, MD;  Location: [facility];  Service: Urology;  Laterality: Bilateral;   HYSTERECTOMY ABDOMINAL WITH SALPINGO-OOPHORECTOMY  07/21/2020   Procedure: HYSTERECTOMY ABDOMINAL WITH BILATERAL SALPINGO-OOPHORECTOMY;  Surgeon: Sanjuana Kava, MD;  Location: [place] [place];  Service: Gynecology;;   IR FLUORO GUIDE CV LINE RIGHT  02/21/2020   IR US GUIDE VASC ACCESS RIGHT  02/21/2020   NEPHROLITHOTOMY Left 02/14/2020   Procedure: NEPHROLITHOTOMY PERCUTANEOUS/ SURGEON ACCESS/ LEFT PERCUTANEOUS NEPHROSTOMY TUBE PLACEMENT;  Surgeon: Ardis Hughs, MD;  [facility]: [facility];  Service: Urology;  Laterality: Left;   NEPHROLITHOTOMY Left 02/21/2020   Procedure: NEPHROLITHOTOMY PERCUTANEOUS SECOND LOOK;  Surgeon: Ardis Hughs, MD;  Location: [facility];  Service: Urology;  Laterality: Left;   NEPHROLITHOTOMY Left 02/26/2020   Procedure: NEPHROLITHOTOMY PERCUTANEOUS;  Surgeon: Ceasar Mons, MD;  Location: [facility];  Service: Urology;  Laterality: Left;  NEED 150 MIN   NEPHROLITHOTOMY Right 03/24/2020   Procedure: NEPHROLITHOTOMY PERCUTANEOUS WITH ACCESS LEFT STENT REMOVAL;  Surgeon: Ardis Hughs, MD;  [facility];  Service: Urology;  Laterality: Right;   RIGHT HEART CATH N/A 11/09/2019   Procedure: RIGHT HEART CATH;  Surgeon: Jolaine Artist, MD;  Location: [place] [facility];  Service: Cardiovascular;  Laterality: N/A;   WISDOM TOOTH EXTRACTION      Social History:  Social History   Socioeconomic History   Marital status: Married    Spouse name: Not on file   Number of children: 0   Years of education: Not on file   Highest education level: Associate degree: occupational, Hotel manager, or vocational program  Occupational History   Occupation: unemployed  Tobacco Use   Smoking  status: Never Smoker   Smokeless tobacco: Never Used  Scientific laboratory technician Use: Never used  Substance and Sexual Activity   Alcohol use: Never   Drug use: Never   Sexual activity: Yes    Birth control/protection: None  Other Topics Concern   Not on file  Social History Narrative   Not on file   Social Determinants of Health   Financial Resource Strain: Not on file  Food Insecurity: Not on file  Transportation Needs: No Transportation Needs   Lack of Transportation (Medical): No   Lack of Transportation (Non-Medical): No  Physical Activity: Not on file  Stress: Not on file  Social Connections: Not on file  Intimate Partner Violence: Not on file    Family History:  Family History  Problem Relation Age of Onset   Atrial fibrillation Mother    Hypertension Mother    Stroke Mother 83   Diabetes Mother    Diabetes Father    Hypertension Father    Diabetes Sister    Hypertension Sister    Diabetes Brother    Hypertension Brother    Diabetes Brother    Heart attack Brother    Stroke Maternal Grandmother 45    Medications:   Current  Outpatient Medications on File Prior to Visit  Medication Sig Dispense Refill   amLODipine (NORVASC) 10 MG tablet Take 1 tablet (10 mg total) by mouth daily. 30 tablet 0   aspirin 81 MG chewable tablet Chew 1 tablet (81 mg total) by mouth daily. 360 tablet 0   atorvastatin (LIPITOR) 80 MG tablet Take 1 tablet (80 mg total) by mouth every evening. 90 tablet 0   carvedilol (COREG) 6.25 MG tablet Take 1 tablet (6.25 mg total) by mouth 2 (two) times daily with a meal. 30 tablet 0   dabigatran (PRADAXA) 150 MG CAPS capsule Take 1 capsule (150 mg total) by mouth every 12 (twelve) hours. 60 capsule 0   dapagliflozin propanediol (FARXIGA) 10 MG TABS tablet Take 1 tablet (10 mg total) by mouth daily before breakfast. 30 tablet 6   glucose blood test strip Use as instructed 100 each 12   hydrALAZINE (APRESOLINE) 100 MG  tablet Take 0.5 tablets (50 mg total) by mouth 3 (three) times daily. 45 tablet 6   insulin aspart (NOVOLOG) 100 UNIT/ML injection Substitute to any brand approved.Before each meal 3 times a day, 140-199 - 2 units, 200-250 - 4 units, 251-299 - 6 units,  300-349 - 8 units,  350 or above 10 units. Dispense syringes and needles as needed, Ok to switch to PEN if approved. DX DM2, Code E11.65 10 mL 0   isosorbide mononitrate (IMDUR) 60 MG 24 hr tablet Take 60 mg by mouth daily.     Lancets (ONETOUCH ULTRASOFT) lancets Check CBG twice a day 60 each 5   LANTUS 100 UNIT/ML injection INJECT 0.2 MLS (20 UNITS TOTAL) INTO THE SKIN EVERY MORNING. 10 mL 0   mexiletine (MEXITIL) 150 MG capsule TAKE 2 CAPSULES (300 MG TOTAL) BY MOUTH EVERY 12 (TWELVE) HOURS. 120 capsule 6   Multiple Vitamins-Minerals (WOMENS MULTI PO) Take 1 tablet by mouth daily.     potassium chloride (KLOR-CON) 10 MEQ tablet Take 10 mEq by mouth daily.     TRUEPLUS INSULIN SYRINGE 30G X 5/16" 0.5 ML MISC USE TO INJECT 12 UNITS AT BEDTIME. 100 each 6   No current facility-administered medications on file prior to visit.    Allergies:   Allergies  Allergen Reactions   Adhesive [Tape]     Tears skin, can tolerate paper tape      OBJECTIVE:  Physical Exam  There were no vitals filed for this visit. There is no height or weight on file to calculate BMI. No exam data present  Depression screen [facility] 2/9 01/20/2021  Decreased Interest 0  Down, Depressed, Hopeless 0  PHQ - 2 Score 0  Altered sleeping 0  Tired, decreased energy 0  Change in appetite 0  Feeling bad or failure about yourself  0  Trouble concentrating 0  Moving slowly or fidgety/restless 0  Suicidal thoughts 0  PHQ-9 Score 0  Difficult doing work/chores Not difficult at all     General: well developed, well nourished, seated, in no evident distress Head: head normocephalic and atraumatic.   Neck: supple with no carotid or supraclavicular  bruits Cardiovascular: regular rate and rhythm, no murmurs Musculoskeletal: no deformity Skin:  no rash/petichiae Vascular:  Normal pulses all extremities   Neurologic Exam Mental Status: Awake and fully alert. Oriented to place and time. Recent and remote memory intact. Attention span, concentration and fund of knowledge appropriate. Mood and affect appropriate.  Cranial Nerves: Fundoscopic exam reveals sharp disc margins. Pupils equal, briskly reactive to light. Extraocular  movements full without nystagmus. Visual fields full to confrontation. Hearing intact. Facial sensation intact. Face, tongue, palate moves normally and symmetrically.  Motor: Normal bulk and tone. Normal strength in all tested extremity muscles Sensory.: intact to touch , pinprick , position and vibratory sensation.  Coordination: Rapid alternating movements normal in all extremities. Finger-to-nose and heel-to-shin performed accurately bilaterally. Gait and Station: Arises from chair without difficulty. Stance is normal. Gait demonstrates normal stride length and balance Reflexes: 1+ and symmetric. Toes downgoing.     NIHSS  *** Modified Rankin  ***      ASSESSMENT: Adrienne Lane is a 55 y.o. year old female presented with left hemiparesis, left facial droop and bilateral floaters and peripheral vision on 12/24/2020 with stroke work-up revealing right pontine lacunar infarct likely secondary to small vessel disease as well as additional small acute infarct in the left frontal subcortical white matter and chronic right CR and left parietal white matter infarcts. Vascular risk factors include prior strokes on imaging, HTN, HLD, DM, A. Fib, CAD, OSA and CHF.      PLAN:  1. R pontine stroke : Residual deficit: ***. Continue {anticoagulants:31417}  and ***  for secondary stroke prevention.  Discussed secondary stroke prevention measures and importance of close PCP follow up for aggressive stroke risk factor management   2. Atrial fibrillation: CHA2DS2-VASc score of at least 6 3. HTN: BP goal <130/90.  Stable on *** per PCP 4. HLD: LDL goal <70. Recent LDL 173 on atorvastatin 40 mg daily therefore increased to 80 mg daily during recent stroke admission.  5. DMII: A1c goal<7.0. Recent A1c 8.0.     Follow up in *** or call earlier if needed   I spent *** minutes of face-to-face and non-face-to-face time with patient.  This included previsit chart review, lab review, study review, order entry, electronic health record documentation, patient education regarding recent stroke, residual deficits, importance of managing stroke risk factors and answered all questions to patient satisfaction     Frann Rider, [facility]  [facility] [address] [place], [place] [address]  Phone [phone] Fax [phone] Note: This document was prepared with digital dictation and possible smart phrase technology. Any transcriptional errors that result from this process are unintentional.

## 2021-02-06 ENCOUNTER — Telehealth (HOSPITAL_COMMUNITY): Payer: Self-pay | Admitting: Pharmacy Technician

## 2021-02-06 ENCOUNTER — Other Ambulatory Visit: Payer: Self-pay | Admitting: Cardiology

## 2021-02-06 ENCOUNTER — Telehealth (HOSPITAL_COMMUNITY): Payer: Self-pay | Admitting: Pharmacist

## 2021-02-06 ENCOUNTER — Other Ambulatory Visit (HOSPITAL_COMMUNITY): Payer: Self-pay | Admitting: *Deleted

## 2021-02-06 MED ORDER — CARVEDILOL 6.25 MG PO TABS: 6.2500 mg | ORAL_TABLET | Freq: Two times a day (BID) | ORAL | 6 refills | Status: DC

## 2021-02-06 MED ORDER — POTASSIUM CHLORIDE CRYS ER 10 MEQ PO TBCR: 10.0000 meq | EXTENDED_RELEASE_TABLET | Freq: Every day | ORAL | 6 refills | Status: DC

## 2021-02-06 MED ORDER — DABIGATRAN ETEXILATE MESYLATE 150 MG PO CAPS: 150.0000 mg | ORAL_CAPSULE | Freq: Two times a day (BID) | ORAL | 3 refills | Status: DC

## 2021-02-06 MED ORDER — AMLODIPINE BESYLATE 10 MG PO TABS: 10.0000 mg | ORAL_TABLET | Freq: Every day | ORAL | 6 refills | Status: DC

## 2021-02-06 MED FILL — PRADAXA 150 MG CAP: 150 | 30 days supply | Qty: 60 | Fill #0

## 2021-02-06 MED FILL — AMLODIPINE BESYLATE 10 MG T: 10 | 30 days supply | Qty: 30 | Fill #0

## 2021-02-06 MED FILL — ?CARVEDILOL 6.25 MG TABLET: 6.25 | 30 days supply | Qty: 60 | Fill #0

## 2021-02-06 MED FILL — ?POTASSIUM CHLOR CRYS ER: 10 | 30 days supply | Qty: 30 | Fill #0

## 2021-02-06 MED FILL — ISOSORBIDE MN ER 60 MG TAB: 60 | 30 days supply | Qty: 30 | Fill #2

## 2021-02-06 NOTE — Progress Notes (Signed)
PCP: Dr Wynetta Emery  Primary Cardiologist: Dr Gardiner Rhyme HF MD: Dr [name] Nephorology: Dr Joelyn Oms   HPI:  Ms Music is a 55 year old with a history ofmorbid obesity,HTN, DM, PAF, PVCs, and chronic systolic HF.   Admitted to [facility] 10/20/19 after a mechanical fall and landed on her hip. ECHO showed EF 20-25% with severe RV dysfunction as well. 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 Renal function worsened. Abdominal Xray showed large staghorn calculi. CT Renal Stone showed bilateral staghorn calculi without hydronephrosis and severe anasarca. Urology consulted on 10/23/19 with mention of possible nephrostomy tubes but renal US was negative for hydronephrosis. GYN consulted for uterine mass. Mass was thought to be large fibroid that would need to follow up after discharge. Nephrology consulted for worsening renal failure. Transferred to [facility] for CV[facility]. Overall diuresed 90 pounds. Later transitioned to torsemide 40 mg daily. Placed on carvedilol and mexiletine due to high PVC burden. Discharged on 11/12/19 with weight 255 pounds.   Zio Patch 11/2019 showed frequent PVCs (19.0%) with two predominant morphologies (9.8 & 9.3%, respectively). Saw Dr. Lovena Le who was considering PVC ablation. Doubted mexilitene was helping much.  Admitted 05/2020 with AKI on CKD. CT scan of the abdomen showed bilateral hydronephrosis and large fibroid uterus. Creatinine on presentation was 3.24. Bilateral ureteral stent placement with bilateral retrograde pyelography.Creatinine improved.   07/2020 underwent total abdominalhysterectomy and bilateral salpingoopherectomy  12/2020 she was admitted for acute CVA. Presented w/ facial droop and blurry vision. She presented outside of window for TPA,itwas not given. Seen by neurology/stroke team.Completedwork-up for CVA. MRI brain showed acute infarct in the right hemipons, additional small acute infarct in the left  frontal subcortical white matter. Mild associated edema in the right pons without substantial mass-effect. Small remote lacunar infarct in the right corona radiata and left periatrial white matter. She had no significant carotid artery stenosis. 2D echo showed reduced LVEF 45 to 50%,left ventricle global hypokinesis. No PFO. No intracardiac source of thrombus. Hemoglobin A1c8.0,and LDL173,not at goal. Aspirin 81 mg was addedand Eliquis was switched to Pradaxa. She was discharged to CIR. While in CIR she developed upper respiratory symptoms and tested + for COVID and was readmitted to [facility] under [place]. Did not require intubation. Chest x-raywasstable,she received 3 doses of steroids and Remdesivir and recovered. Discharged home on 01/06/21.  She recently presented to HF clinic for follow up.  Was doing ok from CHF standpoint. NYHA Class II. Weight had been stable at home. BP was well controlled. Reported full medication compliance. EKG showed ventricular bigeminy but she was asymptomatic. As noted above, echo was done during recent hospitalization and showed slight interval improvement in LVEF. From a neuro standpoint, she continued to have mild left sided UE weakness and has continued to work with PT.   Today she returns to HF clinic for pharmacist medication titration. At last visit with APP, no medication changes were made. Overall she is doing well from a heart failure standpoint. Her main complaint is residual left sided UE weakness after her stroke. She continues to work with PT and OT. She also complains of near constant dizziness since her stroke. She does not have a BP cuff at home, but SBP have been running 145-160 at recent office visits. I encouraged her to get a home BP cuff, but I do not think the dizziness is BP related. More likely, the dizziness is related to her recent stroke. She also has extreme fatigue that will occur for  2-3 days straight, but then she will feel like herself  again. She is clearly frustrated with the dizziness and fatigue. Unfortunately, she was not able to follow up with the neurologist after her hospitalization because both her and her husband got COVID and the appointment had to be canceled. I encouraged her to call the neurology office today to reschedule. She gets SOB on her "bad days" after walking a few feet. On days where she feels more like herself, the SOB is not a problem. Not taking any diuretic. No LEE, PND or orthopnea. Taking all medications as prescribed.      HF/HTN Medications: Carvedilol 6.25 mg BID Farxiga 10 mg daily Hydralazine 50 mg TID Isosorbide mononitrate 60 mg daily Amlodipine 10 mg daily Potassium chloride 10 mEq daily  Has the patient been experiencing any side effects to the medications prescribed?  No - dizziness likely related to stroke, not medications/BP  Does the patient have any problems obtaining medications due to transportation or finances?   Yes - no insurance. Fills at [facility]. Charlton Haws from [employer]. Patient assistance for Pradaxa from [facility] pending.   Understanding of regimen: fair Understanding of indications: fair Potential of compliance: fair Patient understands to avoid NSAIDs. Patient understands to avoid decongestants.    Pertinent Lab Values: . 01/20/21: Serum creatinine 1.41, BUN 138, Potassium 4.0, Sodium 147  Vital Signs: . Weight: 234.4 lbs (last clinic weight: 229.2 lbs) . Blood pressure: 160/68  . Heart rate: 70   Assessment/Plan: 1. Chronic  Systolic Heart Failure ECHO 10/2019 EF 20-25%, pericardial effusion, mild LVH, RA/LA dilated. RV . Possible Tachy Mediated cardiomyopathy verus PVC. She has not had LHC due to elevated creatinine. Had [place] 10/2019 with preserved cardiac output.  - Echo 01/2020 EF 40-45% (Hard to assess with PVCs) RV mild HK. No effusion. - Echo 12/2020: EF 45-50%. RV mildly reduced  - NYHA Class II, Euvolemic on exam  - Continue  carvedilol 6.25 mg BID - Has not been on spironolactone/digoxin/ARB with CKD IIB-IV. Recent creatinine 1.4.  Consider future addition of ARB if Scr remains stable. Scheduled to have labs drawn tomorrow.  - Continue Farxiga 10 mg daily. Receives from [employer] patient assistance program.   - Increase hydralazine to 100 mg three times a day.  - Continue Imdur 60 mg daily.  - Follow up with [facility] Clinic in 4 weeks. Encouraged follow up with neurology.    2. Frequent PVCs  - Failed amiodarone - On mexiletine 300 mg BID - Zio Patch 11/2019 Frequent PVCs (19.0%, 42278) with two predominant morphologies (9.8 & 9.3%, respectively).  - Repeat Zio patch 06/2020 w/ 33% PVC burden - Discussed need to f/u w/ EP to discuss PVC ablation      3. PAF  - Maintaining NSR.  - Continue carvedilol 6.25 mg BID - Had CVA w/ Eliquis, now on Pradaxa - patient assistance pending.   4. CKD 3b - recent creatinine baseline SCr ~1.4 - Follows with Nephrology  5. HTN  - BP elevated at 160/68.  - Increase hydralazine to 100 mg TID as above. Believe dizziness is related to stroke, not BP/medications. However, would monitor closely. Patient plans to buy home BP cuff tomorrow.   7. Type 2 DM - Continue Farxiga  8. Uterine Fibroids  -s/p total abdominalhysterectomy and bilateral salpingoopherectomy  9. Possible OSA with snoring - needs sleep study - complicated by lack of health insurance - can order when she gets Medicaid (currently pending coverage)  10. CVA - recent CVA 1/21 in setting of Afib - Failed Eliquis, now on Pradaxa   11. COVID 19 infection  - 1/22, recovered treated w/ steroids and Remdesivir   Adrienne Lane, PharmD, BCPS, BCCP, CPP Heart Failure Clinic Pharmacist [phone]

## 2021-02-06 NOTE — Telephone Encounter (Signed)
Sent Pradaxa prescription to [facility]. Will use 30 day free card. Will start the application for patient assistance through [facility].   Audry Riles, PharmD, BCPS, BCCP, CPP Heart Failure Clinic Pharmacist [phone]

## 2021-02-06 NOTE — Telephone Encounter (Addendum)
Spoke with patient today, she only had 6 capsules of Pradaxa left. Started an application for [employer] patient assistance for her. Will be at the check in desk for her to sign.  Was able to activate a 30 day free Pradaxa card for her, she is aware to pick this up from [facility].  Will send the application once signatures are obtained.   Patient asked for refills of Coreg, Potassium and Amlodipine to be sent to [facility]. Heather (RN), sent those for her.

## 2021-02-11 NOTE — Telephone Encounter (Signed)
Sent in application via fax.  Will follow up.  

## 2021-02-17 ENCOUNTER — Other Ambulatory Visit (HOSPITAL_COMMUNITY): Payer: Self-pay | Admitting: Internal Medicine

## 2021-02-17 ENCOUNTER — Other Ambulatory Visit: Payer: Self-pay

## 2021-02-17 ENCOUNTER — Ambulatory Visit (HOSPITAL_COMMUNITY)
Admission: RE | Admit: 2021-02-17 | Discharge: 2021-02-17 | Disposition: A | Discharge status: Home / Self Care | Payer: Self-pay | Source: Ambulatory Visit | Attending: Cardiology | Admitting: Cardiology

## 2021-02-17 VITALS — BP 160/68 | HR 70 | Wt 234.4 lb

## 2021-02-17 DIAGNOSIS — I4821 Permanent atrial fibrillation: Secondary | ICD-10-CM | POA: Insufficient documentation

## 2021-02-17 DIAGNOSIS — I130 Hypertensive heart and chronic kidney disease with heart failure and stage 1 through stage 4 chronic kidney disease, or unspecified chronic kidney disease: Secondary | ICD-10-CM | POA: Insufficient documentation

## 2021-02-17 DIAGNOSIS — I6350 Cerebral infarction due to unspecified occlusion or stenosis of unspecified cerebral artery: Secondary | ICD-10-CM | POA: Insufficient documentation

## 2021-02-17 DIAGNOSIS — E1122 Type 2 diabetes mellitus with diabetic chronic kidney disease: Secondary | ICD-10-CM | POA: Insufficient documentation

## 2021-02-17 DIAGNOSIS — I5022 Chronic systolic (congestive) heart failure: Secondary | ICD-10-CM | POA: Insufficient documentation

## 2021-02-17 DIAGNOSIS — N1832 Chronic kidney disease, stage 3b: Secondary | ICD-10-CM | POA: Insufficient documentation

## 2021-02-17 DIAGNOSIS — Z8616 Personal history of COVID-19: Secondary | ICD-10-CM | POA: Insufficient documentation

## 2021-02-17 MED ORDER — HYDRALAZINE HCL 100 MG PO TABS: 100.0000 mg | ORAL_TABLET | Freq: Three times a day (TID) | ORAL | 11 refills | Status: DC

## 2021-02-17 NOTE — Patient Instructions (Addendum)
It was a pleasure seeing you today!  MEDICATIONS: -We are changing your medications today -Increase Hydralazine to 100 mg (1 tablet) three times daily -Call if you have questions about your medications.   NEXT APPOINTMENT: Return to clinic in 4 weeks with [facility].  In general, to take care of your heart failure: -Limit your fluid intake to 2 Liters (half-gallon) per day.   -Limit your salt intake to ideally 2-3 grams (2000-3000 mg) per day. -Weigh yourself daily and record, and bring that "weight diary" to your next appointment.  (Weight gain of 2-3 pounds in 1 day typically means fluid weight.) -The medications for your heart are to help your heart and help you live longer.   -Please contact us before stopping any of your heart medications.  Call the clinic at [phone] with questions or to reschedule future appointments.

## 2021-02-18 ENCOUNTER — Ambulatory Visit: Payer: Self-pay | Admitting: Internal Medicine

## 2021-02-19 ENCOUNTER — Other Ambulatory Visit: Payer: Self-pay

## 2021-02-19 ENCOUNTER — Encounter: Payer: Self-pay | Admitting: Occupational Therapy

## 2021-02-19 ENCOUNTER — Ambulatory Visit: Payer: Self-pay | Attending: Occupational Therapy | Admitting: Occupational Therapy

## 2021-02-19 ENCOUNTER — Ambulatory Visit: Payer: Self-pay

## 2021-02-19 VITALS — BP 171/84 | HR 62

## 2021-02-19 DIAGNOSIS — R262 Difficulty in walking, not elsewhere classified: Secondary | ICD-10-CM

## 2021-02-19 DIAGNOSIS — M6281 Muscle weakness (generalized): Secondary | ICD-10-CM

## 2021-02-19 DIAGNOSIS — R2681 Unsteadiness on feet: Secondary | ICD-10-CM

## 2021-02-19 DIAGNOSIS — M25612 Stiffness of left shoulder, not elsewhere classified: Secondary | ICD-10-CM

## 2021-02-19 DIAGNOSIS — R41842 Visuospatial deficit: Secondary | ICD-10-CM

## 2021-02-19 DIAGNOSIS — R2689 Other abnormalities of gait and mobility: Secondary | ICD-10-CM

## 2021-02-19 DIAGNOSIS — M25512 Pain in left shoulder: Secondary | ICD-10-CM

## 2021-02-19 DIAGNOSIS — R278 Other lack of coordination: Secondary | ICD-10-CM

## 2021-02-19 NOTE — Patient Instructions (Signed)
Access Code: [email or web address] URL: [email or web address] Date: 02/19/2021 Prepared by: Baldomero Lamy  Exercises Sit to Stand Without Arm Support - 1 x daily - 5 x weekly - 2 sets - 10 reps Supine Bridge - 1 x daily - 5 x weekly - 2 sets - 10 reps Clamshell with Resistance - 1 x daily - 5 x weekly - 2 sets - 10 reps Seated March with Resistance - 1 x daily - 5 x weekly - 2 sets - 10 reps

## 2021-02-19 NOTE — Therapy (Signed)
[facility] [address] [place], [place], [address] Phone: [phone]   Fax:  [phone]  Occupational Therapy Treatment  Patient Details  Name: Adrienne Lane MRN: [id number] Date of Birth: 1966-10-10 Referring Provider (OT): Lala Lund, MD   Encounter Date: 02/19/2021   OT End of Session - 02/19/21 1145    Visit Number 2    Number of Visits 9    Date for OT Re-Evaluation 03/30/21    Authorization Type Medicaid Pending    OT Start Time 1145    OT Stop Time 1230    OT Time Calculation (min) 45 min    Activity Tolerance Patient tolerated treatment well    Behavior During Therapy [facility] for tasks assessed/performed           Past Medical History:  Diagnosis Date  . Atrial fibrillation with RVR ([place])   . Bigeminy 01/2020  . CHF (congestive heart failure) ([place]) 10/20/2019  . CKD (chronic kidney disease), stage IV ([place])   . Diabetes mellitus without complication ([place])   . DOE (dyspnea on exertion)    walking upstairs or up hill resolves in one minute  . Fibroids   . History of kidney stones   . History of recent blood transfusion 02/26/2020  . Hypertension   . Iron deficiency anemia   . Non-ischemic cardiomyopathy (HCC)    tachycardia induced  . Obese   . Peripheral edema   . Premature ventricular contractions (PVCs) (VPCs)   . Umbilical hernia   . Wears glasses     Past Surgical History:  Procedure Laterality Date  . CHOLECYSTECTOMY    . CYSTOSCOPY W/ URETERAL STENT PLACEMENT Bilateral 05/24/2020   Procedure: CYSTOSCOPY WITH RETROGRADE PYELOGRAM/URETERAL STENT PLACEMENT;  Surgeon: Robley Fries, MD;  [facility]: [facility];  Service: Urology;  Laterality: Bilateral;  . CYSTOSCOPY/RETROGRADE/URETEROSCOPY Bilateral 04/03/2020   Procedure: CYSTOSCOPY/RETROGRADE/URETEROSCOPY;  Surgeon: Ardis Hughs, MD;  [facility];  Service: Urology;  Laterality: Bilateral;  . HYSTERECTOMY ABDOMINAL WITH  SALPINGO-OOPHORECTOMY  07/21/2020   Procedure: HYSTERECTOMY ABDOMINAL WITH BILATERAL SALPINGO-OOPHORECTOMY;  Surgeon: Sanjuana Kava, MD;  Location: [facility];  Service: Gynecology;;  . IR FLUORO GUIDE CV LINE RIGHT  02/21/2020  . IR US GUIDE VASC ACCESS RIGHT  02/21/2020  . NEPHROLITHOTOMY Left 02/14/2020   Procedure: NEPHROLITHOTOMY PERCUTANEOUS/ SURGEON ACCESS/ LEFT PERCUTANEOUS NEPHROSTOMY TUBE PLACEMENT;  Surgeon: Ardis Hughs, MD;  [facility];  Service: Urology;  Laterality: Left;  . NEPHROLITHOTOMY Left 02/21/2020   Procedure: NEPHROLITHOTOMY PERCUTANEOUS SECOND LOOK;  Surgeon: Ardis Hughs, MD;  [facility];  Service: Urology;  Laterality: Left;  . NEPHROLITHOTOMY Left 02/26/2020   Procedure: NEPHROLITHOTOMY PERCUTANEOUS;  Surgeon: Ceasar Mons, MD;  [facility];  Service: Urology;  Laterality: Left;  NEED 150 MIN  . NEPHROLITHOTOMY Right 03/24/2020   Procedure: NEPHROLITHOTOMY PERCUTANEOUS WITH ACCESS LEFT STENT REMOVAL;  Surgeon: Ardis Hughs, MD;  [facility];  Service: Urology;  Laterality: Right;  . RIGHT HEART CATH N/A 11/09/2019   Procedure: RIGHT HEART CATH;  Surgeon: Jolaine Artist, MD;  Location: [facility];  Service: Cardiovascular;  Laterality: N/A;  . WISDOM TOOTH EXTRACTION      There were no vitals filed for this visit.   Subjective Assessment - 02/19/21 1146    Subjective  Pt reports pain in LUE hand when making a fist.    Pertinent History [place] CVA, DM, HTN, CKD, CHF    Limitations fall risk.  not driving.    Patient Stated Goals get stronger and get back to normal    Currently in Pain? Yes    Pain Score 5     Pain Location Hand    Pain Orientation Left    Pain Descriptors / Indicators Aching;Tightness    Pain Type Acute pain    Pain Onset 1 to 4 weeks ago    Pain Frequency Intermittent    Aggravating Factors  making a fist    Pain Relieving Factors opening hand, heat                         OT Treatments/Exercises (OP) - 02/19/21 1153      ADLs   Overall ADLs pt reports making self use LUE for all door knobs, lights, using LUE for carrying items, etc. Educated on not carrying or managing anything breakable, liquid, hot/cold etc      Modalities   Modalities Fluidotherapy      LUE Fluidotherapy   Number Minutes Fluidotherapy 10 Minutes   with AROM for hand in fluido during modality   LUE Fluidotherapy Location Hand;Wrist    Comments for tightness in hand and wrist and contracture like nodule/knot in palm - no adverse reactions and pt reports feeling better with heat                  OT Education - 02/19/21 1155    Education Details issued yellow theraputty & Coordination HEP    Person(s) Educated Patient    Methods Explanation;Demonstration    Comprehension Verbalized understanding;Returned demonstration            OT Short Term Goals - 02/19/21 1149      OT SHORT TERM GOAL #1   Title Pt will be independent with coordination HEP for LUE 03/02/2021    Baseline not issued yet    Time 4    Period Weeks    Status On-going    Target Date 03/02/21      OT SHORT TERM GOAL #2   Title Pt will demonstrate improved LUE functional use by improving Box and Blocks score to 42 or greater.    Baseline RUE 52 LUE 36    Time 4    Period Weeks    Status New      OT SHORT TERM GOAL #3   Title Pt will improve grip strength in LUE to 18 lbs or greater for increase for clothing management and overall functional use.    Baseline RUE 44.7 LUE 13.8    Time 4    Period Weeks    Status On-going      OT SHORT TERM GOAL #4   Title Pt will report increased speed and accuracy with basic ADLs (i.e. tying shoes, washing up, etc)    Time 4    Period Weeks    Status New             OT Long Term Goals - 02/02/21 1442      OT LONG TERM GOAL #1   Title Pt will be independent with updated HEP for LUE strengthening and coordination    Baseline not issued yet    Time 8     Period Weeks    Status New    Target Date 03/30/21      OT LONG TERM GOAL #2   Title Pt will improve LUE fine motor coordination by completing 9 hole peg test with LUE  in 50 seconds or less    Baseline LUE 68.37, RUE 24.5    Time 8    Period Weeks    Status New      OT LONG TERM GOAL #3   Title Pt will improve grip strength in LUE to 25 lbs or greater for increasing overall functional use of LUE.    Baseline LUE 13.8 RUE 44.7    Time 8    Period Weeks    Status New      OT LONG TERM GOAL #4   Title Pt will improve standing tolerance to 20 minutes or greater in order to increase ability to complete home management tasks with improved activity tolerance    Baseline 10 minutes before requiring break    Time 8    Period Weeks    Status New                 Plan - 02/19/21 1149    Clinical Impression Statement Pt back after evaluation. Progressing towards goals.    OT Occupational Profile and History Problem Focused Assessment - Including review of records relating to presenting problem    Occupational performance deficits (Please refer to evaluation for details): ADL's;IADL's;Leisure    Body Structure / Function / Physical Skills ADL;IADL;Coordination;FMC;ROM;UE functional use;GMC;Vision;Strength;Dexterity;Decreased knowledge of use of DME;Balance;Pain    Rehab Potential Good    Clinical Decision Making Limited treatment options, no task modification necessary    Comorbidities Affecting Occupational Performance: None    Modification or Assistance to Complete Evaluation  No modification of tasks or assist necessary to complete eval    OT Frequency 1x / week    OT Duration 8 weeks   8 visits + eval   OT Treatment/Interventions Self-care/ADL training;Cryotherapy;Fluidtherapy;DME and/or AE instruction;Passive range of motion;Manual Therapy;Functional Mobility Training;Neuromuscular education;Moist Heat;Therapeutic exercise;Patient/family education;Therapeutic  activities;Visual/perceptual remediation/compensation;Energy conservation;Paraffin    Plan coordination HEP for LUE, maybe fluido or paraffin for LUE hand (has nodule/knot - more contracture like)    Consulted and Agree with Plan of Care Patient           Patient will benefit from skilled therapeutic intervention in order to improve the following deficits and impairments:   Body Structure / Function / Physical Skills: ADL,IADL,Coordination,FMC,ROM,UE functional use,GMC,Vision,Strength,Dexterity,Decreased knowledge of use of DME,Balance,Pain       Visit Diagnosis: Other lack of coordination  Other abnormalities of gait and mobility  Unsteadiness on feet  Visuospatial deficit  Stiffness of left shoulder, not elsewhere classified  Muscle weakness (generalized)  Acute pain of left shoulder    Problem List Patient Active Problem List   Diagnosis Date Noted  . Febrile illness, acute 01/03/2021  . Slow transit constipation   . Sore throat   . Irregular cardiac rhythm   . PAF (paroxysmal atrial fibrillation) ([place])   . Labile blood pressure   . Hypokalemia   . Labile blood glucose   . Poorly controlled type 2 diabetes mellitus with peripheral neuropathy ([place])   . Stage 3b chronic kidney disease ([place])   . Right pontine cerebrovascular accident ([place]) 12/27/2020  . Acute CVA (cerebrovascular accident) ([place]) 12/24/2020  . Hyperlipidemia 10/24/2020  . Vitamin D deficiency 10/24/2020  . Fibroids 07/21/2020  . S/P TAH (total abdominal hysterectomy) 07/21/2020  . Chronic a-fib ([place] [address]) 05/23/2020  . Hydroureter on right 05/23/2020  . Microalbuminuria 05/13/2020  . Non-ischemic cardiomyopathy ([place])   . Normocytic anemia   . Nephrolithiasis 02/14/2020  . Paroxysmal atrial fibrillation ([place]) 12/18/2019  . Frequent  PVCs 12/18/2019  . Gross hematuria 12/18/2019  . Staghorn renal calculus 12/18/2019  . Umbilical hernia without obstruction and without gangrene 12/18/2019  . Uterine  fibroid 10/25/2019  . Right hip pain   . Type 2 diabetes mellitus ([place])   . Essential hypertension   . CHF (congestive heart failure) ([place]) 10/21/2019  . Atrial fibrillation with RVR ([place]) 10/21/2019  . Atrial fibrillation with rapid ventricular response [facility]) 10/20/2019    Zachery Conch MOT, OTR/L  02/19/2021, 12:29 PM  [facility] [address] [place], [place], [address] Phone: [phone]   Fax:  [phone]  Name: Adrienne Lane MRN: [id number] Date of Birth: 02/29/1966

## 2021-02-19 NOTE — Telephone Encounter (Signed)
Advanced Heart Failure Patient Advocate Encounter   Patient was approved to receive Pradaxa from [employer]  Effective dates: 02/17/21 through 02/17/22  Spoke with the patient. Provided phone number, [phone].  Charlann Boxer, CPhT

## 2021-02-19 NOTE — Patient Instructions (Signed)
  Coordination Activities  Perform the following activities for 20 minutes 2 times per day with left hand(s).   Rotate ball in fingertips (clockwise and counter-clockwise).  Toss ball between hands.  Toss ball in air and catch with the same hand.  Flip cards 1 at a time as fast as you can.  Deal cards with your thumb (Hold deck in hand and push card off top with thumb).  Shuffle cards.  Pick up coins, buttons, marbles, dried beans/pasta of different sizes and place in container.  Pick up coins and place in container or coin bank.  Pick up coins and stack.  Pick up coins one at a time until you get 5-10 in your hand, then move coins from palm to fingertips to stack one at a time.  Twirl pen between fingers.  Practice writing and/or typing.  Screw together nuts and bolts, then unfasten.

## 2021-02-19 NOTE — Therapy (Signed)
[facility] [address] [place], [place], [address] Phone: [phone]   Fax:  [phone]  Physical Therapy Treatment  Patient Details  Name: Adrienne Lane MRN: [id number] Date of Birth: [birth year] Referring Provider (PT): Referred by: Thurnell Lose, MD. PCP is Nicolette Bang, DO   Encounter Date: 02/19/2021   PT End of Session - 02/19/21 1109    Visit Number 2    Number of Visits 9    Date for PT Re-Evaluation --   Re-Cert on 9th Visit   Authorization Type Medicaid (Pending)    PT Start Time 5885   patient arriving late and then needing to use bathroom prior to session   PT Stop Time 1145    PT Time Calculation (min) 32 min    Equipment Utilized During Treatment Gait belt    Activity Tolerance Patient tolerated treatment well    Behavior During Therapy [facility] for tasks assessed/performed           Past Medical History:  Diagnosis Date  . Atrial fibrillation with RVR ([place])   . Bigeminy 01/2020  . CHF (congestive heart failure) ([place]) 10/20/2019  . CKD (chronic kidney disease), stage IV ([place])   . Diabetes mellitus without complication ([place])   . DOE (dyspnea on exertion)    walking upstairs or up hill resolves in one minute  . Fibroids   . History of kidney stones   . History of recent blood transfusion 02/26/2020  . Hypertension   . Iron deficiency anemia   . Non-ischemic cardiomyopathy (HCC)    tachycardia induced  . Obese   . Peripheral edema   . Premature ventricular contractions (PVCs) (VPCs)   . Umbilical hernia   . Wears glasses     Past Surgical History:  Procedure Laterality Date  . CHOLECYSTECTOMY    . CYSTOSCOPY W/ URETERAL STENT PLACEMENT Bilateral 05/24/2020   Procedure: CYSTOSCOPY WITH RETROGRADE PYELOGRAM/URETERAL STENT PLACEMENT;  Surgeon: Robley Fries, MD;  [facility];  Service: Urology;  Laterality: Bilateral;  . CYSTOSCOPY/RETROGRADE/URETEROSCOPY Bilateral  04/03/2020   Procedure: CYSTOSCOPY/RETROGRADE/URETEROSCOPY;  Surgeon: Ardis Hughs, MD;  [facility];  Service: Urology;  Laterality: Bilateral;  . HYSTERECTOMY ABDOMINAL WITH SALPINGO-OOPHORECTOMY  07/21/2020   Procedure: HYSTERECTOMY ABDOMINAL WITH BILATERAL SALPINGO-OOPHORECTOMY;  Surgeon: Sanjuana Kava, MD;  Location: [facility];  Service: Gynecology;;  . IR FLUORO GUIDE CV LINE RIGHT  02/21/2020  . IR US GUIDE VASC ACCESS RIGHT  02/21/2020  . NEPHROLITHOTOMY Left 02/14/2020   Procedure: NEPHROLITHOTOMY PERCUTANEOUS/ SURGEON ACCESS/ LEFT PERCUTANEOUS NEPHROSTOMY TUBE PLACEMENT;  Surgeon: Ardis Hughs, MD;  [facility];  Service: Urology;  Laterality: Left;  . NEPHROLITHOTOMY Left 02/21/2020   Procedure: NEPHROLITHOTOMY PERCUTANEOUS SECOND LOOK;  Surgeon: Ardis Hughs, MD;  [facility];  Service: Urology;  Laterality: Left;  . NEPHROLITHOTOMY Left 02/26/2020   Procedure: NEPHROLITHOTOMY PERCUTANEOUS;  Surgeon: Ceasar Mons, MD;  [facility];  Service: Urology;  Laterality: Left;  NEED 150 MIN  . NEPHROLITHOTOMY Right 03/24/2020   Procedure: NEPHROLITHOTOMY PERCUTANEOUS WITH ACCESS LEFT STENT REMOVAL;  Surgeon: Ardis Hughs, MD;  [facility];  Service: Urology;  Laterality: Right;  . RIGHT HEART CATH N/A 11/09/2019   Procedure: RIGHT HEART CATH;  Surgeon: Jolaine Artist, MD;  Location: [facility];  Service: Cardiovascular;  Laterality: N/A;  . WISDOM TOOTH EXTRACTION      Vitals:   02/19/21 1117  BP: (!) 171/84  Pulse: 62  Subjective Assessment - 02/19/21 1113    Subjective Patient reports has had one fall since initial visit, she fell on her L side when she caughter her L foot/toe on the step. Reports some lightheadedness as well.    Pertinent History CVA, Diabetes, CKD Stage 3, A. fib, CHF, HTN, , Normocytic anemia, Hyperlipidemia, COVID - 19    Limitations Standing;Walking;House hold activities    How long can you walk  comfortably? 10 minutes    Patient Stated Goals get strength back in LLE              [facility] Adult PT Treatment/Exercise - 02/19/21 0001      Transfers   Transfers Sit to Stand;Stand to Sit    Sit to Stand 5: Supervision    Stand to Sit 5: Supervision    Comments completed sit <> stands from mat without UE support, verbal cues for slowed control descent      Ambulation/Gait   Ambulation/Gait Yes    Ambulation/Gait Assistance 5: Supervision    Ambulation/Gait Assistance Details completed ambulation into therapy session with [facility]    Assistive device Straight cane    Gait Pattern Step-through pattern;Decreased arm swing - left;Decreased stance time - left;Decreased dorsiflexion - left;Decreased weight shift to left;Poor foot clearance - left    Ambulation Surface Level;Indoor      Exercises   Exercises Other Exercises    Other Exercises  Established initial HEP focused on BLE strengthening             Access Code: [email or web address] URL: [email or web address] Date: 02/19/2021 Prepared by: Baldomero Lamy  Exercises Sit to Stand Without Arm Support - 1 x daily - 5 x weekly - 2 sets - 10 reps Supine Bridge - 1 x daily - 5 x weekly - 2 sets - 10 reps Clamshell with Resistance - 1 x daily - 5 x weekly - 2 sets - 10 reps Seated March with Resistance - 1 x daily - 5 x weekly - 2 sets - 10 reps       PT Education - 02/19/21 1115    Education Details educated on initial HEP    Person(s) Educated Patient    Methods Explanation;Demonstration;Handout    Comprehension Verbalized understanding;Returned demonstration            PT Short Term Goals - 02/02/21 1356      PT SHORT TERM GOAL #1   Title Patient will be independent with initial HEP for balance/strengthening (STGs Due at 5th Visit)    Baseline No HEP established    Time 4    Period --   visits   Status New      PT SHORT TERM GOAL #2   Title Patient will improve 5x sit <> stand to </= 20 seconds to  demonstrate improved balance    Baseline 24.72    Time 4    Period --   visits   Status New      PT SHORT TERM GOAL #3   Title Patient will imrpove Berg Balance to >/= 46/56 to demonstrate reduced fall risk and improved balance    Baseline 43/56    Time 4    Period --   visits   Status New      PT SHORT TERM GOAL #4   Title Patient will demo ability to ambulate >/= 500 ft with supervision without AD to demonstrate improved household mobility    Baseline 115 ft with supervision/CGA    Time 4  Period --   visits   Status New             PT Long Term Goals - 02/02/21 1453      PT LONG TERM GOAL #1   Title Patient will be independent with Final HEP for strengthening/balance (ALL LTGs Due at 9th Visit)    Baseline no HEP established    Time 8    Period --   visits   Status New      PT LONG TERM GOAL #2   Title Patient will improve TUG to </= 10 seconds to demo improved balance and reduced fall risk    Baseline 12.25 secs    Time 8    Period --   visits   Status New      PT LONG TERM GOAL #3   Title Patient will improve Berg Balance to >/= 50/56 to demonstrate decreased fall risk and improved balance    Baseline 43/56    Time 8    Period --   visits   Status New      PT LONG TERM GOAL #4   Title Patient will improve gait speed to >/= 2.75 ft/sec to demonstrate improved community ambulation    Baseline 2.42 ft/sec    Time 8    Period --   visits   Status New      PT LONG TERM GOAL #5   Title Patient will improve 5x sit <> stand to </= 15 seconds to demonstrate reduce fall risk and improved balance    Baseline 24.72 secs    Time 8    Period --   visits   Status New      Additional Long Term Goals   Additional Long Term Goals Yes      PT LONG TERM GOAL #6   Title Patient will demo abilit yto ambulate >/= 500 ft on various outdoor surfaces for improved community mobility    Baseline TBA    Time 8    Period --   visits   Status New                  Plan - 02/19/21 1115    Clinical Impression Statement Today's skilled session focused on establishing initial HEP focused on strengthening. Session limited due to patient arriving late. Patient tolerating all activities well today.  Will continue to progress toward all LTGs.    Personal Factors and Comorbidities Comorbidity 3+    Comorbidities CVA, Diabetes, CKD Stage 3, A. fib, CHF, HTN, , Normocytic anemia, Hyperlipidemia, COVID - 19    Examination-Activity Limitations Bed Mobility;Transfers;Stand;Stairs    Examination-Participation Restrictions Church;Community Activity;Laundry;Cleaning    Stability/Clinical Decision Making Evolving/Moderate complexity    Rehab Potential Good    PT Frequency 1x / week    PT Duration 8 weeks   plus eval   PT Treatment/Interventions ADLs/Self Care Home Management;Aquatic Therapy;Cryotherapy;DME Instruction;Moist Heat;Electrical Stimulation;Gait training;Stair training;Functional mobility training;Therapeutic activities;Balance training;Therapeutic exercise;Neuromuscular re-education;Patient/family education;Orthotic Fit/Training;Passive range of motion;Vestibular;Manual techniques    PT Next Visit Plan How was HEP? added balance to HEP. Assess gait with foot up brace on LLE?    Consulted and Agree with Plan of Care Patient           Patient will benefit from skilled therapeutic intervention in order to improve the following deficits and impairments:  Abnormal gait,Decreased coordination,Difficulty walking,Decreased range of motion,Decreased activity tolerance,Decreased balance,Decreased knowledge of use of DME,Decreased mobility,Decreased strength,Decreased endurance,Decreased safety awareness  Visit Diagnosis:  Muscle weakness (generalized)  Other abnormalities of gait and mobility  Unsteadiness on feet  Difficulty in walking, not elsewhere classified     Problem List Patient Active Problem List   Diagnosis Date Noted  . Febrile  illness, acute 01/03/2021  . Slow transit constipation   . Sore throat   . Irregular cardiac rhythm   . PAF (paroxysmal atrial fibrillation) ([place])   . Labile blood pressure   . Hypokalemia   . Labile blood glucose   . Poorly controlled type 2 diabetes mellitus with peripheral neuropathy ([place])   . Stage 3b chronic kidney disease ([place])   . Right pontine cerebrovascular accident ([place]) 12/27/2020  . Acute CVA (cerebrovascular accident) ([place]) 12/24/2020  . Hyperlipidemia 10/24/2020  . Vitamin D deficiency 10/24/2020  . Fibroids 07/21/2020  . S/P TAH (total abdominal hysterectomy) 07/21/2020  . Chronic a-fib ([place]) 05/23/2020  . Hydroureter on right 05/23/2020  . Microalbuminuria 05/13/2020  . Non-ischemic cardiomyopathy ([place])   . Normocytic anemia   . Nephrolithiasis 02/14/2020  . Paroxysmal atrial fibrillation ([place]) 12/18/2019  . 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 Gross hematuria 12/18/2019  . Staghorn renal calculus 12/18/2019  . Umbilical hernia without obstruction and without gangrene 12/18/2019  . Uterine fibroid 10/25/2019  . Right hip pain   . Type 2 diabetes mellitus ([place])   . Essential hypertension   . CHF (congestive heart failure) ([place]) 10/21/2019  . Atrial fibrillation with RVR ([place]) 10/21/2019  . Atrial fibrillation with rapid ventricular response ([place]) 10/20/2019    Jones Bales, PT, DPT 02/19/2021, 12:33 PM  [facility] [address] [place], [place], [address] Phone: [phone]   Fax:  [phone]  Name: Adrienne Lane MRN: [id number] Date of Birth: February 05, 1966

## 2021-02-23 ENCOUNTER — Ambulatory Visit: Payer: Self-pay

## 2021-02-23 ENCOUNTER — Ambulatory Visit: Payer: Self-pay | Admitting: Occupational Therapy

## 2021-02-23 ENCOUNTER — Encounter: Payer: Self-pay | Admitting: Occupational Therapy

## 2021-02-23 ENCOUNTER — Other Ambulatory Visit: Payer: Self-pay

## 2021-02-23 VITALS — BP 142/88 | HR 82

## 2021-02-23 DIAGNOSIS — R2681 Unsteadiness on feet: Secondary | ICD-10-CM

## 2021-02-23 DIAGNOSIS — M6281 Muscle weakness (generalized): Secondary | ICD-10-CM

## 2021-02-23 DIAGNOSIS — R2689 Other abnormalities of gait and mobility: Secondary | ICD-10-CM

## 2021-02-23 DIAGNOSIS — R41842 Visuospatial deficit: Secondary | ICD-10-CM

## 2021-02-23 DIAGNOSIS — R262 Difficulty in walking, not elsewhere classified: Secondary | ICD-10-CM

## 2021-02-23 DIAGNOSIS — M25612 Stiffness of left shoulder, not elsewhere classified: Secondary | ICD-10-CM

## 2021-02-23 DIAGNOSIS — M25512 Pain in left shoulder: Secondary | ICD-10-CM

## 2021-02-23 DIAGNOSIS — R278 Other lack of coordination: Secondary | ICD-10-CM

## 2021-02-23 NOTE — Therapy (Signed)
[facility] [address] [place], [place], [address] Phone: [phone]   Fax:  [phone]  Occupational Therapy Treatment  Patient Details  Name: Adrienne Lane MRN: [id number] Date of Birth: 10-15-1966 Referring Provider (OT): Lala Lund, MD   Encounter Date: 02/23/2021   OT End of Session - 02/23/21 1314    Visit Number 3    Number of Visits 9    Date for OT Re-Evaluation 03/30/21    Authorization Type Medicaid Pending    OT Start Time 1314    OT Stop Time 1355    OT Time Calculation (min) 41 min    Activity Tolerance Patient tolerated treatment well    Behavior During Therapy [facility] for tasks assessed/performed           Past Medical History:  Diagnosis Date  . Atrial fibrillation with RVR ([place])   . Bigeminy 01/2020  . CHF (congestive heart failure) ([place]) 10/20/2019  . CKD (chronic kidney disease), stage IV ([place])   . Diabetes mellitus without complication ([place])   . DOE (dyspnea on exertion)    walking upstairs or up hill resolves in one minute  . Fibroids   . History of kidney stones   . History of recent blood transfusion 02/26/2020  . Hypertension   . Iron deficiency anemia   . Non-ischemic cardiomyopathy (HCC)    tachycardia induced  . Obese   . Peripheral edema   . Premature ventricular contractions (PVCs) (VPCs)   . Umbilical hernia   . Wears glasses     Past Surgical History:  Procedure Laterality Date  . CHOLECYSTECTOMY    . CYSTOSCOPY W/ URETERAL STENT PLACEMENT Bilateral 05/24/2020   Procedure: CYSTOSCOPY WITH RETROGRADE PYELOGRAM/URETERAL STENT PLACEMENT;  Surgeon: Robley Fries, MD;  [facility];  Service: Urology;  Laterality: Bilateral;  . CYSTOSCOPY/RETROGRADE/URETEROSCOPY Bilateral 04/03/2020   Procedure: CYSTOSCOPY/RETROGRADE/URETEROSCOPY;  Surgeon: Ardis Hughs, MD;  [facility];  Service: Urology;  Laterality: Bilateral;  . HYSTERECTOMY ABDOMINAL WITH  SALPINGO-OOPHORECTOMY  07/21/2020   Procedure: HYSTERECTOMY ABDOMINAL WITH BILATERAL SALPINGO-OOPHORECTOMY;  Surgeon: Sanjuana Kava, MD;  Location: [facility];  Service: Gynecology;;  . IR FLUORO GUIDE CV LINE RIGHT  02/21/2020  . IR US GUIDE VASC ACCESS RIGHT  02/21/2020  . NEPHROLITHOTOMY Left 02/14/2020   Procedure: NEPHROLITHOTOMY PERCUTANEOUS/ SURGEON ACCESS/ LEFT PERCUTANEOUS NEPHROSTOMY TUBE PLACEMENT;  Surgeon: Ardis Hughs, MD;  [facility];  Service: Urology;  Laterality: Left;  . NEPHROLITHOTOMY Left 02/21/2020   Procedure: NEPHROLITHOTOMY PERCUTANEOUS SECOND LOOK;  Surgeon: Ardis Hughs, MD;  [facility];  Service: Urology;  Laterality: Left;  . NEPHROLITHOTOMY Left 02/26/2020   Procedure: NEPHROLITHOTOMY PERCUTANEOUS;  Surgeon: Ceasar Mons, MD;  [facility];  Service: Urology;  Laterality: Left;  NEED 150 MIN  . NEPHROLITHOTOMY Right 03/24/2020   Procedure: NEPHROLITHOTOMY PERCUTANEOUS WITH ACCESS LEFT STENT REMOVAL;  Surgeon: Ardis Hughs, MD;  [facility];  Service: Urology;  Laterality: Right;  . RIGHT HEART CATH N/A 11/09/2019   Procedure: RIGHT HEART CATH;  Surgeon: Jolaine Artist, MD;  Location: [facility];  Service: Cardiovascular;  Laterality: N/A;  . WISDOM TOOTH EXTRACTION      There were no vitals filed for this visit.   Subjective Assessment - 02/23/21 1314    Subjective  "My hand doesn't feel dead or as numb"    Pertinent History [place] CVA, DM, HTN, CKD, CHF    Limitations fall risk. not driving.  Patient Stated Goals get stronger and get back to normal    Currently in Pain? No/denies                        OT Treatments/Exercises (OP) - 02/23/21 1318      Exercises   Exercises Hand      Hand Exercises   Other Hand Exercises with LUE - unable to squeeze hand gripper with silver spring on level 1 and downgraded to blue plastic tongs. Pt placed 1 inch blocks into bowl with use of tongs.  Upgrade to white spring with level 1 and pt able to pick up approx 40 1 inch blocks and place into bowl for increase in LUE hand/gripstrengthening. Pt reported pain (aching) with LUE thenar region with gold and silver springs.      Modalities   Modalities Fluidotherapy      LUE Fluidotherapy   Number Minutes Fluidotherapy 12 Minutes    LUE Fluidotherapy Location Hand;Wrist    Comments for tightness, pain relief, etc      Fine Motor Coordination (Hand/Wrist)   Fine Motor Coordination Grooved pegs    Grooved pegs with LUE with min drops and difficulty - pt required increased time                    OT Short Term Goals - 02/23/21 1339      OT SHORT TERM GOAL #1   Title Pt will be independent with coordination HEP for LUE 03/02/2021    Baseline not issued yet    Time 4    Period Weeks    Status On-going    Target Date 03/02/21      OT SHORT TERM GOAL #2   Title Pt will demonstrate improved LUE functional use by improving Box and Blocks score to 42 or greater.    Baseline RUE 52 LUE 36    Time 4    Period Weeks    Status New      OT SHORT TERM GOAL #3   Title Pt will improve grip strength in LUE to 18 lbs or greater for increase for clothing management and overall functional use.    Baseline RUE 44.7 LUE 13.8    Time 4    Period Weeks    Status On-going      OT SHORT TERM GOAL #4   Title Pt will report increased speed and accuracy with basic ADLs (i.e. tying shoes, washing up, etc)    Time 4    Period Weeks    Status On-going   pt reports "its a little better" 02/23/21            OT Long Term Goals - 02/02/21 1442      OT LONG TERM GOAL #1   Title Pt will be independent with updated HEP for LUE strengthening and coordination    Baseline not issued yet    Time 8    Period Weeks    Status New    Target Date 03/30/21      OT LONG TERM GOAL #2   Title Pt will improve LUE fine motor coordination by completing 9 hole peg test with LUE in 50 seconds or less     Baseline LUE 68.37, RUE 24.5    Time 8    Period Weeks    Status New      OT LONG TERM GOAL #3   Title Pt will improve grip strength in  LUE to 25 lbs or greater for increasing overall functional use of LUE.    Baseline LUE 13.8 RUE 44.7    Time 8    Period Weeks    Status New      OT LONG TERM GOAL #4   Title Pt will improve standing tolerance to 20 minutes or greater in order to increase ability to complete home management tasks with improved activity tolerance    Baseline 10 minutes before requiring break    Time 8    Period Weeks    Status New                 Plan - 02/23/21 1338    Clinical Impression Statement Pt continuing to progress towards goals. Pt reports completing HEPs for coordination and grip strength.    OT Occupational Profile and History Problem Focused Assessment - Including review of records relating to presenting problem    Occupational performance deficits (Please refer to evaluation for details): ADL's;IADL's;Leisure    Body Structure / Function / Physical Skills ADL;IADL;Coordination;FMC;ROM;UE functional use;GMC;Vision;Strength;Dexterity;Decreased knowledge of use of DME;Balance;Pain    Rehab Potential Good    Clinical Decision Making Limited treatment options, no task modification necessary    Comorbidities Affecting Occupational Performance: None    Modification or Assistance to Complete Evaluation  No modification of tasks or assist necessary to complete eval    OT Frequency 1x / week    OT Duration 8 weeks   8 visits + eval   OT Treatment/Interventions Self-care/ADL training;Cryotherapy;Fluidtherapy;DME and/or AE instruction;Passive range of motion;Manual Therapy;Functional Mobility Training;Neuromuscular education;Moist Heat;Therapeutic exercise;Patient/family education;Therapeutic activities;Visual/perceptual remediation/compensation;Energy conservation;Paraffin    Plan continue LUE grip strength and coordination.    Consulted and Agree with  Plan of Care Patient           Patient will benefit from skilled therapeutic intervention in order to improve the following deficits and impairments:   Body Structure / Function / Physical Skills: ADL,IADL,Coordination,FMC,ROM,UE functional use,GMC,Vision,Strength,Dexterity,Decreased knowledge of use of DME,Balance,Pain       Visit Diagnosis: Muscle weakness (generalized)  Other abnormalities of gait and mobility  Unsteadiness on feet  Difficulty in walking, not elsewhere classified  Visuospatial deficit  Other lack of coordination  Stiffness of left shoulder, not elsewhere classified  Acute pain of left shoulder    Problem List Patient Active Problem List   Diagnosis Date Noted  . Febrile illness, acute 01/03/2021  . Slow transit constipation   . Sore throat   . Irregular cardiac rhythm   . PAF (paroxysmal atrial fibrillation) ([place])   . Labile blood pressure   . Hypokalemia   . Labile blood glucose   . Poorly controlled type 2 diabetes mellitus with peripheral neuropathy ([place])   . Stage 3b chronic kidney disease ([place])   . Right pontine cerebrovascular accident ([place]) 12/27/2020  . Acute CVA (cerebrovascular accident) ([place]) 12/24/2020  . Hyperlipidemia 10/24/2020  . Vitamin D deficiency 10/24/2020  . Fibroids 07/21/2020  . S/P TAH (total abdominal hysterectomy) 07/21/2020  . Chronic a-fib ([place]) 05/23/2020  . Hydroureter on right 05/23/2020  . Microalbuminuria 05/13/2020  . Non-ischemic cardiomyopathy ([place])   . Normocytic anemia   . Nephrolithiasis 02/14/2020  . Paroxysmal atrial fibrillation ([place]) 12/18/2019  . 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 Gross hematuria 12/18/2019  . Staghorn renal calculus 12/18/2019  . Umbilical hernia without obstruction and without gangrene 12/18/2019  . Uterine fibroid 10/25/2019  . Right hip pain   . Type 2 diabetes mellitus ([place])   .  Essential hypertension   . CHF (congestive heart failure) ([place]) 10/21/2019  . Atrial  fibrillation with RVR ([place]) 10/21/2019  . Atrial fibrillation with rapid ventricular response ([place]) 10/20/2019    Zachery Conch MOT, OTR/L  02/23/2021, 2:03 PM  [facility] [address] [place], [place], [address] Phone: [phone]   Fax:  [phone]  Name: Jarelly Rinck MRN: [id number] Date of Birth: 11/27/1966

## 2021-02-23 NOTE — Therapy (Signed)
[facility] [address] [place], [place], [address] Phone: [phone]   Fax:  [phone]  Physical Therapy Treatment  Patient Details  Name: Adrienne Lane MRN: [id number] Date of Birth: August 11, 1966 Referring Provider (PT): Referred by: Thurnell Lose, MD. PCP is Nicolette Bang, DO   Encounter Date: 02/23/2021   PT End of Session - 02/23/21 1229    Visit Number 3    Number of Visits 9    Date for PT Re-Evaluation --   Re-Cert on 9th Visit   Authorization Type Medicaid (Pending)    PT Start Time 1230    PT Stop Time 1312    PT Time Calculation (min) 42 min    Equipment Utilized During Treatment Gait belt    Activity Tolerance Patient tolerated treatment well    Behavior During Therapy [facility] for tasks assessed/performed           Past Medical History:  Diagnosis Date  . Atrial fibrillation with RVR ([place])   . Bigeminy 01/2020  . CHF (congestive heart failure) ([place]) 10/20/2019  . CKD (chronic kidney disease), stage IV ([place])   . Diabetes mellitus without complication ([place])   . DOE (dyspnea on exertion)    walking upstairs or up hill resolves in one minute  . Fibroids   . History of kidney stones   . History of recent blood transfusion 02/26/2020  . Hypertension   . Iron deficiency anemia   . Non-ischemic cardiomyopathy (HCC)    tachycardia induced  . Obese   . Peripheral edema   . Premature ventricular contractions (PVCs) (VPCs)   . Umbilical hernia   . Wears glasses     Past Surgical History:  Procedure Laterality Date  . CHOLECYSTECTOMY    . CYSTOSCOPY W/ URETERAL STENT PLACEMENT Bilateral 05/24/2020   Procedure: CYSTOSCOPY WITH RETROGRADE PYELOGRAM/URETERAL STENT PLACEMENT;  Surgeon: Robley Fries, MD;  [facility];  Service: Urology;  Laterality: Bilateral;  . CYSTOSCOPY/RETROGRADE/URETEROSCOPY Bilateral 04/03/2020   Procedure: CYSTOSCOPY/RETROGRADE/URETEROSCOPY;  Surgeon: Ardis Hughs, MD;  [facility];  Service: Urology;  Laterality: Bilateral;  . HYSTERECTOMY ABDOMINAL WITH SALPINGO-OOPHORECTOMY  07/21/2020   Procedure: HYSTERECTOMY ABDOMINAL WITH BILATERAL SALPINGO-OOPHORECTOMY;  Surgeon: Sanjuana Kava, MD;  Location: [facility];  Service: Gynecology;;  . IR FLUORO GUIDE CV LINE RIGHT  02/21/2020  . IR US GUIDE VASC ACCESS RIGHT  02/21/2020  . NEPHROLITHOTOMY Left 02/14/2020   Procedure: NEPHROLITHOTOMY PERCUTANEOUS/ SURGEON ACCESS/ LEFT PERCUTANEOUS NEPHROSTOMY TUBE PLACEMENT;  Surgeon: Ardis Hughs, MD;  [facility];  Service: Urology;  Laterality: Left;  . NEPHROLITHOTOMY Left 02/21/2020   Procedure: NEPHROLITHOTOMY PERCUTANEOUS SECOND LOOK;  Surgeon: Ardis Hughs, MD;  [facility];  Service: Urology;  Laterality: Left;  . NEPHROLITHOTOMY Left 02/26/2020   Procedure: NEPHROLITHOTOMY PERCUTANEOUS;  Surgeon: Ceasar Mons, MD;  [facility];  Service: Urology;  Laterality: Left;  NEED 150 MIN  . NEPHROLITHOTOMY Right 03/24/2020   Procedure: NEPHROLITHOTOMY PERCUTANEOUS WITH ACCESS LEFT STENT REMOVAL;  Surgeon: Ardis Hughs, MD;  [facility];  Service: Urology;  Laterality: Right;  . RIGHT HEART CATH N/A 11/09/2019   Procedure: RIGHT HEART CATH;  Surgeon: Jolaine Artist, MD;  Location: [facility];  Service: Cardiovascular;  Laterality: N/A;  . WISDOM TOOTH EXTRACTION      Vitals:   02/23/21 1235  BP: (!) 142/88  Pulse: 82     Subjective Assessment - 02/23/21 1230    Subjective  Patient reports that exercises are going well. Reports the knee is feeling better.  No other new changes/complaints.    Pertinent History CVA, Diabetes, CKD Stage 3, A. fib, CHF, HTN, , Normocytic anemia, Hyperlipidemia, COVID - 19    Limitations Standing;Walking;House hold activities    How long can you walk comfortably? 10 minutes    Patient Stated Goals get strength back in LLE    Currently in Pain? No/denies              [facility] Adult PT Treatment/Exercise - 02/23/21 0001      Transfers   Transfers Sit to Stand;Stand to Sit    Sit to Stand 5: Supervision    Stand to Sit 5: Supervision    Comments paitent consistently trying to complete sit <> stand without UE support throughout session      Ambulation/Gait   Ambulation/Gait Yes    Ambulation/Gait Assistance 5: Supervision    Ambulation/Gait Assistance Details completed ambulation into therapy gym without AD, however patient ambulating throughout session without AD and supervision    Assistive device Straight cane;None    Gait Pattern Step-through pattern;Decreased arm swing - left;Decreased stance time - left;Decreased dorsiflexion - left;Decreased weight shift to left;Poor foot clearance - left    Ambulation Surface Level;Indoor      Exercises   Exercises Knee/Hip;Other Exercises    Other Exercises  Per patient request; completed reveiw of seated marching (that is included on HEP), completed x 10 reps. PT educating on proper completion, cues for technique/form.      Knee/Hip Exercises: Aerobic   Other Aerobic Completed SciFit with BLE/BUE x 6 minutes on Level 2.5 for improved stregthening and activity tolerance/endurance.               Balance Exercises - 02/23/21 0001      Balance Exercises: Standing   Standing Eyes Opened Narrow base of support (BOS);Head turns;Foam/compliant surface;Limitations    Standing Eyes Opened Limitations completed horizontal/vertical head turns x 10 reps with narrow BOS and eyes open on airex.    Standing Eyes Closed Narrow base of support (BOS);Foam/compliant surface;3 reps;Limitations;Head turns;Wide (BOA)    Standing Eyes Closed Limitations completed standing with eyes closed and narrow BOS 3 x 30 seconds; completed horizontal/vertical head turns x 10 reps. increased challenge with vertical > horizontal.    SLS with Vectors Foam/compliant surface;Intermittent upper extremity assist;Limitations    SLS with Vectors  Limitations standing on airex completed alternating toe taps to 6" steps x 15 reps bilaterally. then progressed to completing crossover x 10 reps. increased balance challenge with crossover noted requiring intermittent touch to // bars and CGA from PT.    Stepping Strategy Anterior;Foam/compliant surface;Limitations    Stepping Strategy Limitations standing on red balance beam completed anterior stepping strategy x 10 reps, initially using UE support progressing to no UE support with completion.               PT Short Term Goals - 02/02/21 1356      PT SHORT TERM GOAL #1   Title Patient will be independent with initial HEP for balance/strengthening (STGs Due at 5th Visit)    Baseline No HEP established    Time 4    Period --   visits   Status New      PT SHORT TERM GOAL #2   Title Patient will improve 5x sit <> stand to </= 20 seconds to demonstrate improved balance    Baseline 24.72    Time  4    Period --   visits   Status New      PT SHORT TERM GOAL #3   Title Patient will imrpove Berg Balance to >/= 46/56 to demonstrate reduced fall risk and improved balance    Baseline 43/56    Time 4    Period --   visits   Status New      PT SHORT TERM GOAL #4   Title Patient will demo ability to ambulate >/= 500 ft with supervision without AD to demonstrate improved household mobility    Baseline 115 ft with supervision/CGA    Time 4    Period --   visits   Status New             PT Long Term Goals - 02/02/21 1453      PT LONG TERM GOAL #1   Title Patient will be independent with Final HEP for strengthening/balance (ALL LTGs Due at 9th Visit)    Baseline no HEP established    Time 8    Period --   visits   Status New      PT LONG TERM GOAL #2   Title Patient will improve TUG to </= 10 seconds to demo improved balance and reduced fall risk    Baseline 12.25 secs    Time 8    Period --   visits   Status New      PT LONG TERM GOAL #3   Title Patient will improve Berg  Balance to >/= 50/56 to demonstrate decreased fall risk and improved balance    Baseline 43/56    Time 8    Period --   visits   Status New      PT LONG TERM GOAL #4   Title Patient will improve gait speed to >/= 2.75 ft/sec to demonstrate improved community ambulation    Baseline 2.42 ft/sec    Time 8    Period --   visits   Status New      PT LONG TERM GOAL #5   Title Patient will improve 5x sit <> stand to </= 15 seconds to demonstrate reduce fall risk and improved balance    Baseline 24.72 secs    Time 8    Period --   visits   Status New      Additional Long Term Goals   Additional Long Term Goals Yes      PT LONG TERM GOAL #6   Title Patient will demo abilit yto ambulate >/= 500 ft on various outdoor surfaces for improved community mobility    Baseline TBA    Time 8    Period --   visits   Status New                 Plan - 02/23/21 1229    Clinical Impression Statement Today's skilled session focused on continued strengthening for LLE > RLE, as well as working toward balance activities on complaint surfaces.. Increased challenge noted with vision removed and vertical head turns, as well as activities promoting SLS. Will continue to progress toward all LTGs.    Personal Factors and Comorbidities Comorbidity 3+    Comorbidities CVA, Diabetes, CKD Stage 3, A. fib, CHF, HTN, , Normocytic anemia, Hyperlipidemia, COVID - 19    Examination-Activity Limitations Bed Mobility;Transfers;Stand;Stairs    Examination-Participation Restrictions Church;Community Activity;Laundry;Cleaning    Stability/Clinical Decision Making Evolving/Moderate complexity    Rehab Potential Good    PT Frequency  1x / week    PT Duration 8 weeks   plus eval   PT Treatment/Interventions ADLs/Self Care Home Management;Aquatic Therapy;Cryotherapy;DME Instruction;Moist Heat;Electrical Stimulation;Gait training;Stair training;Functional mobility training;Therapeutic activities;Balance  training;Therapeutic exercise;Neuromuscular re-education;Patient/family education;Orthotic Fit/Training;Passive range of motion;Vestibular;Manual techniques    PT Next Visit Plan Continue  working on corner/high level balance. Potentially assess gait with foot up brace on LLE. Activites to promote SLS.    Consulted and Agree with Plan of Care Patient           Patient will benefit from skilled therapeutic intervention in order to improve the following deficits and impairments:  Abnormal gait,Decreased coordination,Difficulty walking,Decreased range of motion,Decreased activity tolerance,Decreased balance,Decreased knowledge of use of DME,Decreased mobility,Decreased strength,Decreased endurance,Decreased safety awareness  Visit Diagnosis: Muscle weakness (generalized)  Other abnormalities of gait and mobility  Unsteadiness on feet  Difficulty in walking, not elsewhere classified     Problem List Patient Active Problem List   Diagnosis Date Noted  . Febrile illness, acute 01/03/2021  . Slow transit constipation   . Sore throat   . Irregular cardiac rhythm   . PAF (paroxysmal atrial fibrillation) ([place])   . Labile blood pressure   . Hypokalemia   . Labile blood glucose   . Poorly controlled type 2 diabetes mellitus with peripheral neuropathy ([place])   . Stage 3b chronic kidney disease ([place])   . Right pontine cerebrovascular accident ([place]) 12/27/2020  . Acute CVA (cerebrovascular accident) ([place]) 12/24/2020  . Hyperlipidemia 10/24/2020  . Vitamin D deficiency 10/24/2020  . Fibroids 07/21/2020  . S/P TAH (total abdominal hysterectomy) 07/21/2020  . Chronic a-fib ([place]) 05/23/2020  . Hydroureter on right 05/23/2020  . Microalbuminuria 05/13/2020  . Non-ischemic cardiomyopathy ([place])   . Normocytic anemia   . Nephrolithiasis 02/14/2020  . Paroxysmal atrial fibrillation ([place]) 12/18/2019  . 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 Gross hematuria 12/18/2019  . Staghorn renal calculus  12/18/2019  . Umbilical hernia without obstruction and without gangrene 12/18/2019  . Uterine fibroid 10/25/2019  . Right hip pain   . Type 2 diabetes mellitus ([place])   . Essential hypertension   . CHF (congestive heart failure) ([place]) 10/21/2019  . Atrial fibrillation with RVR ([place]) 10/21/2019  . Atrial fibrillation with rapid ventricular response ([place]) 10/20/2019    Jones Bales, PT, DPT 02/23/2021, 1:16 PM  [facility] [address] [place], [place], [address] Phone: [phone]   Fax:  [phone]  Name: Geoffrey Mankin MRN: [id number] Date of Birth: 09/01/1966

## 2021-02-27 MED FILL — MEXILETINE 150 MG CAPSULE: 150 | 30 days supply | Qty: 120 | Fill #2

## 2021-03-01 NOTE — Progress Notes (Signed)
Patient ID: Adrienne Lane, female    DOB: 1966/02/22  MRN: [id number]  CC: Hypertension and Diabetes Follow-Up  Subjective: Adrienne Lane is a 55 y.o. female who presents for hypertension and diabetes follow-up. Her concerns today include:   1. HYPERTENSION FOLLOW-UP: 10/23/2020 per DO note: Repeat BP 150/89, improved but still above goal. Has been on higher dose of Amlodipine in past but unable to tolerate due to peripheral lower extremity edema. Could consider Ace/Arb--patient reports this was discontinued when [name] was started. Given complicated cardiac history, have asked patient to follow up with cardiologist.   03/02/2021: Currently taking: see medication list  Med Adherence: [x]  Yes    []  No Medication side effects: []  Yes    [x]  No Exercise: Trying to walk Diet: Trying to improve Home Monitoring?: []  Yes    [x]  No Monitoring Frequency: []  Yes    [x]  No Home BP results range: []  Yes    [x]  No Smoking []  Yes [x]  No SOB? []  Yes    [x]  No Chest Pain?: []  Yes    [x]  No Leg swelling?: []  Yes    [x]  No Headaches?: []  Yes    [x]  No Dizziness? []  Yes    [x]  No Comments: Recently feeling lightheaded, blurry vision, and fatigue. Reports she has an appointment scheduled with Neurology for this on tomorrow.   2. DIABETES TYPE 2 FOLLOW-UP: 10/23/2020 per DO note: A1c worsened from 7.0 to 8.4. Patient believes this is due to her changes in diet. Will trial better diet adherence before making adjustments to regimen. Return in 3 months for monitoring.   03/02/2021: Last A1C: 8.0% on 12/25/2020 Med Adherence:  [x]  Yes    []  No Medication side effects:  []  Yes    [x]  No Home Monitoring?  [x]  Yes    []  No Home glucose results range: Fasting 130's   Diet Adherence: Trying to improve, sometimes does have more bread and rice but trying to limit these Exercise: Trying to walk Hypoglycemic episodes?: []  Yes    [x]  No Last eye exam: 3 years ago  Patient Active Problem List    Diagnosis Date Noted  . Febrile illness, acute 01/03/2021  . Slow transit constipation   . Sore throat   . Irregular cardiac rhythm   . PAF (paroxysmal atrial fibrillation) ([place])   . Labile blood pressure   . Hypokalemia   . Labile blood glucose   . Poorly controlled type 2 diabetes mellitus with peripheral neuropathy ([place])   . Stage 3b chronic kidney disease ([place])   . Right pontine cerebrovascular accident ([place]) 12/27/2020  . Acute CVA (cerebrovascular accident) ([place]) 12/24/2020  . Hyperlipidemia 10/24/2020  . Vitamin D deficiency 10/24/2020  . Fibroids 07/21/2020  . S/P TAH (total abdominal hysterectomy) 07/21/2020  . Chronic a-fib ([place]) 05/23/2020  . Hydroureter on right 05/23/2020  . Microalbuminuria 05/13/2020  . Non-ischemic cardiomyopathy ([place])   . Normocytic anemia   . Nephrolithiasis 02/14/2020  . Paroxysmal atrial fibrillation ([place]) 12/18/2019  . 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 Gross hematuria 12/18/2019  . Staghorn renal calculus 12/18/2019  . Umbilical hernia without obstruction and without gangrene 12/18/2019  . Uterine fibroid 10/25/2019  . Right hip pain   . Type 2 diabetes mellitus ([place])   . Essential hypertension   . CHF (congestive heart failure) ([place]) 10/21/2019  . Atrial fibrillation with RVR ([place]) 10/21/2019  . Atrial fibrillation with rapid ventricular response ([place]) 10/20/2019     Current Outpatient Medications on File Prior  to Visit  Medication Sig Dispense Refill  . amLODipine (NORVASC) 10 MG tablet Take 1 tablet (10 mg total) by mouth daily. 30 tablet 6  . aspirin 81 MG chewable tablet Chew 1 tablet (81 mg total) by mouth daily. 360 tablet 0  . atorvastatin (LIPITOR) 80 MG tablet Take 1 tablet (80 mg total) by mouth every evening. 90 tablet 0  . carvedilol (COREG) 6.25 MG tablet Take 1 tablet (6.25 mg total) by mouth 2 (two) times daily with a meal. 60 tablet 6  . dabigatran (PRADAXA) 150 MG CAPS capsule Take 1 capsule (150 mg total) by mouth every 12  (twelve) hours. 60 capsule 3  . dapagliflozin propanediol (FARXIGA) 10 MG TABS tablet Take 1 tablet (10 mg total) by mouth daily before breakfast. 30 tablet 6  . glucose blood test strip Use as instructed 100 each 12  . hydrALAZINE (APRESOLINE) 100 MG tablet Take 1 tablet (100 mg total) by mouth 3 (three) times daily. 90 tablet 11  . insulin aspart (NOVOLOG) 100 UNIT/ML injection Substitute to any brand approved.Before each meal 3 times a day, 140-199 - 2 units, 200-250 - 4 units, 251-299 - 6 units,  300-349 - 8 units,  350 or above 10 units. Dispense syringes and needles as needed, Ok to switch to PEN if approved. DX DM2, Code E11.65 10 mL 0  . isosorbide mononitrate (IMDUR) 60 MG 24 hr tablet Take 60 mg by mouth daily.    . Lancets (ONETOUCH ULTRASOFT) lancets Check CBG twice a day 60 each 5  . LANTUS 100 UNIT/ML injection INJECT 0.2 MLS (20 UNITS TOTAL) INTO THE SKIN EVERY MORNING. 10 mL 0  . mexiletine (MEXITIL) 150 MG capsule TAKE 2 CAPSULES (300 MG TOTAL) BY MOUTH EVERY 12 (TWELVE) HOURS. 120 capsule 6  . Multiple Vitamins-Minerals (WOMENS MULTI PO) Take 1 tablet by mouth daily.    . potassium chloride (KLOR-CON) 10 MEQ tablet Take 1 tablet (10 mEq total) by mouth daily. 30 tablet 6  . TRUEPLUS INSULIN SYRINGE 30G X 5/16" 0.5 ML MISC USE TO INJECT 12 UNITS AT BEDTIME. 100 each 6   No current facility-administered medications on file prior to visit.    Allergies  Allergen Reactions  . Adhesive [Tape]     Tears skin, can tolerate paper tape    Social History   Socioeconomic History  . Marital status: Married    Spouse name: Not on file  . Number of children: 0  . Years of education: Not on file  . Highest education level: Associate degree: occupational, Hotel manager, or vocational program  Occupational History  . Occupation: unemployed  Tobacco Use  . Smoking status: Never Smoker  . Smokeless tobacco: Never Used  Vaping Use  . Vaping Use: Never used  Substance and Sexual  Activity  . Alcohol use: Never  . Drug use: Never  . Sexual activity: Yes    Birth control/protection: None  Other Topics Concern  . Not on file  Social History Narrative  . Not on file   Social Determinants of Health   Financial Resource Strain: Not on file  Food Insecurity: Not on file  Transportation Needs: No Transportation Needs  . Lack of Transportation (Medical): No  . Lack of Transportation (Non-Medical): No  Physical Activity: Not on file  Stress: Not on file  Social Connections: Not on file  Intimate Partner Violence: Not on file    Family History  Problem Relation Age of Onset  . Atrial fibrillation Mother   .  Hypertension Mother   . Stroke Mother 72  . Diabetes Mother   . Diabetes Father   . Hypertension Father   . Diabetes Sister   . Hypertension Sister   . Diabetes Brother   . Hypertension Brother   . Diabetes Brother   . Heart attack Brother   . Stroke Maternal Grandmother 45    Past Surgical History:  Procedure Laterality Date  . CHOLECYSTECTOMY    . CYSTOSCOPY W/ URETERAL STENT PLACEMENT Bilateral 05/24/2020   Procedure: CYSTOSCOPY WITH RETROGRADE PYELOGRAM/URETERAL STENT PLACEMENT;  Surgeon: Robley Fries, MD;  Location: [facility];  Service: Urology;  Laterality: Bilateral;  . CYSTOSCOPY/RETROGRADE/URETEROSCOPY Bilateral 04/03/2020   Procedure: CYSTOSCOPY/RETROGRADE/URETEROSCOPY;  Surgeon: Ardis Hughs, MD;  Location: [facility];  Service: Urology;  Laterality: Bilateral;  . HYSTERECTOMY ABDOMINAL WITH SALPINGO-OOPHORECTOMY  07/21/2020   Procedure: HYSTERECTOMY ABDOMINAL WITH BILATERAL SALPINGO-OOPHORECTOMY;  Surgeon: Sanjuana Kava, MD;  Location: [facility];  Service: Gynecology;;  . IR FLUORO GUIDE CV LINE RIGHT  02/21/2020  . IR US GUIDE VASC ACCESS RIGHT  02/21/2020  . NEPHROLITHOTOMY Left 02/14/2020   Procedure: NEPHROLITHOTOMY PERCUTANEOUS/ SURGEON ACCESS/ LEFT PERCUTANEOUS NEPHROSTOMY TUBE PLACEMENT;  Surgeon: Ardis Hughs, MD;  Location: [facility];   Service: Urology;  Laterality: Left;  . NEPHROLITHOTOMY Left 02/21/2020   Procedure: NEPHROLITHOTOMY PERCUTANEOUS SECOND LOOK;  Surgeon: Ardis Hughs, MD;  Location: [facility];  Service: Urology;  Laterality: Left;  . NEPHROLITHOTOMY Left 02/26/2020   Procedure: NEPHROLITHOTOMY PERCUTANEOUS;  Surgeon: Ceasar Mons, MD;  Location: [facility];  Service: Urology;  Laterality: Left;  NEED 150 MIN  . NEPHROLITHOTOMY Right 03/24/2020   Procedure: NEPHROLITHOTOMY PERCUTANEOUS WITH ACCESS LEFT STENT REMOVAL;  Surgeon: Ardis Hughs, MD;  Location: [facility];  Service: Urology;  Laterality: Right;  . RIGHT HEART CATH N/A 11/09/2019   Procedure: RIGHT HEART CATH;  Surgeon: Jolaine Artist, MD;  Location: [facility];  Service: Cardiovascular;  Laterality: N/A;  . WISDOM TOOTH EXTRACTION      ROS: Review of Systems Negative except as stated above  PHYSICAL EXAM: BP (!) 147/80 (BP Location: Left Arm, Patient Position: Sitting)   Pulse 67   Wt 232 lb (105.2 kg)   SpO2 99%   BMI 35.28 kg/m   Physical Exam Constitutional:      Appearance: She is obese.  HENT:     Head: Normocephalic and atraumatic.  Eyes:     Extraocular Movements: Extraocular movements intact.     Conjunctiva/sclera: Conjunctivae normal.     Pupils: Pupils are equal, round, and reactive to light.  Cardiovascular:     Rate and Rhythm: Normal rate and regular rhythm.     Pulses: Normal pulses.     Heart sounds: Normal heart sounds.  Pulmonary:     Effort: Pulmonary effort is normal.     Breath sounds: Normal breath sounds.  Musculoskeletal:     Cervical back: Normal range of motion and neck supple.  Neurological:     General: No focal deficit present.     Mental Status: She is alert and oriented to person, place, and time.  Psychiatric:        Mood and Affect: Mood normal.        Behavior: Behavior normal.    ASSESSMENT AND PLAN: 1. Essential hypertension: - Blood pressure elevated during  today's visit. Patient asymptomatic without chest pressure, chest pain, palpitations, shortness of breath, and worst headache of life. - Continue Amlodipine, Carvedilol, Hydralazine, and Imdur as  prescribed per Cardiology. - Counseled on low-sodium, DASH diet, medication compliance, 150 minutes of moderate intensity exercise per week as tolerated. Discussed medication compliance, adverse effects. - Keep appointments with Cardiology. - Follow-up with primary provider as scheduled.  2. Type 2 diabetes mellitus with hyperglycemia, with long-term current use of insulin ([place]): - Previous hemoglobin A1c 8.0% on 12/25/2020 not at goal, goal < 7%. Repeat hemoglobin A1c due April 2022, patient should return for appointment for this. - Continue Farxiga, Novolog, and Lantus as prescribed. - To achieve an A1C goal of less than or equal to 7.0 percent, a fasting blood sugar of 80 to 130 mg/dL and a postprandial glucose (90 to 120 minutes after a meal) less than 180 mg/dL. In the event of sugars less than 60 mg/dl or greater than 400 mg/dl please notify the clinic ASAP. It is recommended that you undergo annual eye exams and annual foot exams. - Discussed the importance of healthy eating habits, low-carbohydrate diet, low-sugar diet, regular aerobic exercise (at least 150 minutes a week as tolerated) and medication compliance to achieve or maintain control of diabetes.  3. Hyperlipidemia, unspecified hyperlipidemia type: - Continue Atorvastatin as prescribed.  -Practice low-fat heart healthy diet and at least 150 minutes of moderate intensity exercise weekly as tolerated.   4. Diabetic eye exam ([place]): - Referral to Ophthalmology for diabetic eye examination.  - Ambulatory referral to Ophthalmology   Patient was given the opportunity to ask questions.  Patient verbalized understanding of the plan and was able to repeat key elements of the plan. Patient was given clear instructions to go to Emergency Department  or return to medical center if symptoms don't improve, worsen, or new problems develop.The patient verbalized understanding.   Orders Placed This Encounter  Procedures  . Ambulatory referral to Ophthalmology    Return for Dr. Juleen China Follow-Up 1 month diabetes and 3 months.  Camillia Herter, NP

## 2021-03-02 ENCOUNTER — Encounter: Payer: Self-pay | Admitting: Family

## 2021-03-02 ENCOUNTER — Ambulatory Visit (INDEPENDENT_AMBULATORY_CARE_PROVIDER_SITE_OTHER): Payer: Self-pay | Admitting: Family

## 2021-03-02 ENCOUNTER — Other Ambulatory Visit: Payer: Self-pay

## 2021-03-02 VITALS — BP 147/80 | HR 67 | Wt 232.0 lb

## 2021-03-02 DIAGNOSIS — Z794 Long term (current) use of insulin: Secondary | ICD-10-CM

## 2021-03-02 DIAGNOSIS — E1165 Type 2 diabetes mellitus with hyperglycemia: Secondary | ICD-10-CM

## 2021-03-02 DIAGNOSIS — E785 Hyperlipidemia, unspecified: Secondary | ICD-10-CM

## 2021-03-02 DIAGNOSIS — Z0100 Encounter for examination of eyes and vision without abnormal findings: Secondary | ICD-10-CM

## 2021-03-02 DIAGNOSIS — I10 Essential (primary) hypertension: Secondary | ICD-10-CM

## 2021-03-02 DIAGNOSIS — E119 Type 2 diabetes mellitus without complications: Secondary | ICD-10-CM

## 2021-03-02 NOTE — Patient Instructions (Signed)
Follow-up 1 month diabetes A1c.   Follow-up 3 months hypertension and diabetes.   Hypertension, Adult Hypertension is another name for high blood pressure. High blood pressure forces your heart to work harder to pump blood. This can cause problems over time. There are two numbers in a blood pressure reading. There is a top number (systolic) over a bottom number (diastolic). It is best to have a blood pressure that is below 120/80. Healthy choices can help lower your blood pressure, or you may need medicine to help lower it. What are the causes? The cause of this condition is not known. Some conditions may be related to high blood pressure. What increases the risk?  Smoking.  Having type 2 diabetes mellitus, high cholesterol, or both.  Not getting enough exercise or physical activity.  Being overweight.  Having too much fat, sugar, calories, or salt (sodium) in your diet.  Drinking too much alcohol.  Having long-term (chronic) kidney disease.  Having a family history of high blood pressure.  Age. Risk increases with age.  Race. You may be at higher risk if you are African American.  Gender. Men are at higher risk than women before age 31. After age 67, women are at higher risk than men.  Having obstructive sleep apnea.  Stress. What are the signs or symptoms?  High blood pressure may not cause symptoms. Very high blood pressure (hypertensive crisis) may cause: ? Headache. ? Feelings of worry or nervousness (anxiety). ? Shortness of breath. ? Nosebleed. ? A feeling of being sick to your stomach (nausea). ? Throwing up (vomiting). ? Changes in how you see. ? Very bad chest pain. ? Seizures. How is this treated?  This condition is treated by making healthy lifestyle changes, such as: ? Eating healthy foods. ? Exercising more. ? Drinking less alcohol.  Your health care provider may prescribe medicine if lifestyle changes are not enough to get your blood pressure  under control, and if: ? Your top number is above 130. ? Your bottom number is above 80.  Your personal target blood pressure may vary. Follow these instructions at home: Eating and drinking  If told, follow the DASH eating plan. To follow this plan: ? Fill one half of your plate at each meal with fruits and vegetables. ? Fill one fourth of your plate at each meal with whole grains. Whole grains include whole-wheat pasta, brown rice, and whole-grain bread. ? Eat or drink low-fat dairy products, such as skim milk or low-fat yogurt. ? Fill one fourth of your plate at each meal with low-fat (lean) proteins. Low-fat proteins include fish, chicken without skin, eggs, beans, and tofu. ? Avoid fatty meat, cured and processed meat, or chicken with skin. ? Avoid pre-made or processed food.  Eat less than 1,500 mg of salt each day.  Do not drink alcohol if: ? Your doctor tells you not to drink. ? You are pregnant, may be pregnant, or are planning to become pregnant.  If you drink alcohol: ? Limit how much you use to:  0-1 drink a day for women.  0-2 drinks a day for men. ? Be aware of how much alcohol is in your drink. In the [place] one drink equals one 12 oz bottle of beer (355 mL), one 5 oz glass of wine (148 mL), or one 1 oz glass of hard liquor (44 mL).   Lifestyle  Work with your doctor to stay at a healthy weight or to lose weight. Ask your doctor  what the best weight is for you.  Get at least 30 minutes of exercise most days of the week. This may include walking, swimming, or biking.  Get at least 30 minutes of exercise that strengthens your muscles (resistance exercise) at least 3 days a week. This may include lifting weights or doing Pilates.  Do not use any products that contain nicotine or tobacco, such as cigarettes, e-cigarettes, and chewing tobacco. If you need help quitting, ask your doctor.  Check your blood pressure at home as told by your doctor.  Keep all follow-up  visits as told by your doctor. This is important.   Medicines  Take over-the-counter and prescription medicines only as told by your doctor. Follow directions carefully.  Do not skip doses of blood pressure medicine. The medicine does not work as well if you skip doses. Skipping doses also puts you at risk for problems.  Ask your doctor about side effects or reactions to medicines that you should watch for. Contact a doctor if you:  Think you are having a reaction to the medicine you are taking.  Have headaches that keep coming back (recurring).  Feel dizzy.  Have swelling in your ankles.  Have trouble with your vision. Get help right away if you:  Get a very bad headache.  Start to feel mixed up (confused).  Feel weak or numb.  Feel faint.  Have very bad pain in your: ? Chest. ? Belly (abdomen).  Throw up more than once.  Have trouble breathing. Summary  Hypertension is another name for high blood pressure.  High blood pressure forces your heart to work harder to pump blood.  For most people, a normal blood pressure is less than 120/80.  Making healthy choices can help lower blood pressure. If your blood pressure does not get lower with healthy choices, you may need to take medicine. This information is not intended to replace advice given to you by your health care provider. Make sure you discuss any questions you have with your health care provider. Document Revised: 08/16/2018 Document Reviewed: 08/16/2018 Elsevier Patient Education  2021 [employer].

## 2021-03-02 NOTE — Progress Notes (Signed)
Wants to know what A1c is today last checked 12/25/20  Needs refill on Hydralazine

## 2021-03-03 ENCOUNTER — Ambulatory Visit (INDEPENDENT_AMBULATORY_CARE_PROVIDER_SITE_OTHER): Payer: MEDICAID | Admitting: Adult Health

## 2021-03-03 ENCOUNTER — Encounter: Payer: Self-pay | Admitting: Adult Health

## 2021-03-03 VITALS — BP 121/73 | HR 54 | Ht 68.0 in | Wt 234.0 lb

## 2021-03-03 DIAGNOSIS — I6350 Cerebral infarction due to unspecified occlusion or stenosis of unspecified cerebral artery: Secondary | ICD-10-CM

## 2021-03-03 NOTE — Progress Notes (Signed)
[facility] [address] [place]. [place] [address] [phone]       HOSPITAL FOLLOW UP NOTE  [name] Date of Birth:  04-Dec-1966 Medical Record Number:  [id number]   Reason for Referral:  hospital stroke follow up    SUBJECTIVE:   CHIEF COMPLAINT:  Chief Complaint  Patient presents with   Follow-up    RM 14 Alone Pt is fatigued, light headed, weakness on L side, pain in lower back/arm/shoulder, can not close L hand all the way and has some pain in hip/knee from falling     HPI:   Adrienne Lane is a 56 y.o. female with PMHx of DM, Afib on Eliquis, CKD stage 4, HFpEF who presented to [facility] ED on 12/24/2020 with floaters on OU in peripheral vision along with LUE & LLE weakness and left facial droop.  Personally reviewed hospitalization pertinent progress notes, lab work and imaging with summary provided.  Evaluated by Dr. Leonie Man with stroke work-up revealing right pontine lacunar infarct likely secondary from small vessel disease as well as additional small acute infarct in the left frontal subcortical white matter and small remote lacunar infarcts in the right corona radiata and left parietal white matter.  History of atrial fibrillation compliant on Eliquis and aspirin 81 mg daily with recommended continuation of aspirin 81 mg daily and transition Eliquis to Pradaxa 150 mg twice daily. Hx of HTN stable on amlodipine, carvedilol and hydralazine.  History of HLD on atorvastatin 40 mg daily with LDL 173 therefore increased dosage to 80 mg daily.  Uncontrolled DM with A1c 8.0.  Other stroke risk factors include obesity, CAD, OSA (evaluated by sleep smart trial) and CHF.  Evaluated by therapies who recommended discharge to CIR for ongoing therapy needs on 12/27/2020.  On 01/03/2021 during CIR admission, found to have COVID with low-grade fever, cough and sore throat therefore discharged to acute services for ongoing care and treatment.  She was eventually discharged  home on 01/06/2021 (as all family members in household positive for COVID and mild symptoms) as she was unable to return back to CIR unless she had a negative Covid test (which could take 14-21 days).   Stroke - right pontine lacunar infarct likely from small vessel disease, the patient has a history of atrial fibrillation and was compliant with her Eliquis  CT Head: chronic microvascular ischemic disease or age or indeterminate lacunar infarct, Additional age indeterminate lacunar, infarct within the right corona radiata, favored remote  MRI : Acute infarct in the right hemipons. Additional small acute infarct in the left frontal subcortical white matter. Small remote lacunar infarcts in the right corona radiata and left periatrial white matter.  MR angio Head - neg  Carotid US unremarkable 2D Echo: EF 45 to 50%.  LDL 173  HgbA1c 8.0  VTE prophylaxis - On pradaxa  Eliquis (apixaban) daily prior to admission, now on ASA 81 and Pradaxa 150 mg BID. Continue on discharge  Therapy recommendations:  CIR  Disposition:  CIR  Today, 03/03/2021, Adrienne Lane is being seen for hospital follow-up unaccompanied.  Reports residual left sided weakness, left shoulder pain, lightheadedness/dizziness, occasional blurred vision (especialy when fatigued), short term memory loss and delayed word finding difficulty.  Also complains of excessive daytime fatigue Reported dizziness/lightheadedness appears to be more closely related to imbalance and disequilibrium -reports good days and bad days Use of cane outside of home otherwise no AD in the home Currently working with neuro rehab PT/OT Denies new stroke/TIA symptoms  She was screened for sleep apnea during admission as part of sleep smart study where she was found to have sleep apnea but had difficulty tolerating full face mask She has not pursued outpatient sleep study as she is currently insured - currently medicaid pending   Compliant on aspirin and  Pradaxa -denies side effects Compliant on atorvastatin 80 mg daily -denies side effects Blood pressure today 121/73 - monitors at home typically ranging 130s/80-90s Glucose levels stable typically 120s-130s occasionally fluctuates  Plans on f/u with PCP next month for repeat lab work  No further concerns at this time     ROS:   14 system review of systems performed and negative with exception of those listed in HPI  PMH:  Past Medical History:  Diagnosis Date   Atrial fibrillation with RVR ([place])    Bigeminy 01/2020   CHF (congestive heart failure) ([place]) 10/20/2019   CKD (chronic kidney disease), stage IV (H[facility])    Diabetes mellitus without complication ([place])    DOE (dyspnea on exertion)    walking upstairs or up hill resolves in one minute   Fibroids    History of kidney stones    History of recent blood transfusion 02/26/2020   Hypertension    Iron deficiency anemia    Non-ischemic cardiomyopathy ([place])    tachycardia induced   Obese    Peripheral edema    Premature ventricular contractions (PVCs) ([facility])    Umbilical hernia    Wears glasses     PSH:  Past Surgical History:  Procedure Laterality Date   CHOLECYSTECTOMY     CYSTOSCOPY W/ URETERAL STENT PLACEMENT Bilateral 05/24/2020   Procedure: CYSTOSCOPY WITH RETROGRADE PYELOGRAM/URETERAL STENT PLACEMENT;  Surgeon: Robley Fries, MD;  Location: [facility];  Service: Urology;  Laterality: Bilateral;   CYSTOSCOPY/RETROGRADE/URETEROSCOPY Bilateral 04/03/2020   Procedure: CYSTOSCOPY/RETROGRADE/URETEROSCOPY;  Surgeon: Ardis Hughs, MD;  Location: [facility];  Service: Urology;  Laterality: Bilateral;   HYSTERECTOMY ABDOMINAL WITH SALPINGO-OOPHORECTOMY  07/21/2020   Procedure: HYSTERECTOMY ABDOMINAL WITH BILATERAL SALPINGO-OOPHORECTOMY;  Surgeon: Sanjuana Kava, MD;  Location: [facility];  Service: Gynecology;;   IR FLUORO GUIDE CV LINE RIGHT  02/21/2020   IR US GUIDE VASC ACCESS RIGHT  02/21/2020   NEPHROLITHOTOMY  Left 02/14/2020   Procedure: NEPHROLITHOTOMY PERCUTANEOUS/ SURGEON ACCESS/ LEFT PERCUTANEOUS NEPHROSTOMY TUBE PLACEMENT;  Surgeon: Ardis Hughs, MD;  Location: [facility];  Service: Urology;  Laterality: Left;   NEPHROLITHOTOMY Left 02/21/2020   Procedure: NEPHROLITHOTOMY PERCUTANEOUS SECOND LOOK;  Surgeon: Ardis Hughs, MD;  Location: [facility];  Service: Urology;  Laterality: Left;   NEPHROLITHOTOMY Left 02/26/2020   Procedure: NEPHROLITHOTOMY PERCUTANEOUS;  Surgeon: Ceasar Mons, MD;  Location: [facility];  Service: Urology;  Laterality: Left;  NEED 150 MIN   NEPHROLITHOTOMY Right 03/24/2020   Procedure: NEPHROLITHOTOMY PERCUTANEOUS WITH ACCESS LEFT STENT REMOVAL;  Surgeon: Ardis Hughs, MD;  Location: [facility];  Service: Urology;  Laterality: Right;   RIGHT HEART CATH N/A 11/09/2019   Procedure: RIGHT HEART CATH;  Surgeon: Jolaine Artist, MD;  Location: [facility];  Service: Cardiovascular;  Laterality: N/A;   WISDOM TOOTH EXTRACTION      Social History:  Social History   Socioeconomic History   Marital status: Married    Spouse name: Not on file   Number of children: 0   Years of education: Not on file   Highest education level: Associate degree: occupational, Hotel manager, or vocational program  Occupational History   Occupation: unemployed  Tobacco  Use   Smoking status: Never Smoker   Smokeless tobacco: Never Used  Vaping Use   Vaping Use: Never used  Substance and Sexual Activity   Alcohol use: Never   Drug use: Never   Sexual activity: Yes    Birth control/protection: None  Other Topics Concern   Not on file  Social History Narrative   Not on file   Social Determinants of Health   Financial Resource Strain: Not on file  Food Insecurity: Not on file  Transportation Needs: No Transportation Needs   Lack of Transportation (Medical): No   Lack of Transportation (Non-Medical): No  Physical Activity: Not on file  Stress:  Not on file  Social Connections: Not on file  Intimate Partner Violence: Not on file    Family History:  Family History  Problem Relation Age of Onset   Atrial fibrillation Mother    Hypertension Mother    Stroke Mother 4   Diabetes Mother    Diabetes Father    Hypertension Father    Diabetes Sister    Hypertension Sister    Diabetes Brother    Hypertension Brother    Diabetes Brother    Heart attack Brother    Stroke Maternal Grandmother 45    Medications:   Current Outpatient Medications on File Prior to Visit  Medication Sig Dispense Refill   amLODipine (NORVASC) 10 MG tablet Take 1 tablet (10 mg total) by mouth daily. 30 tablet 6   aspirin 81 MG chewable tablet Chew 1 tablet (81 mg total) by mouth daily. 360 tablet 0   atorvastatin (LIPITOR) 80 MG tablet Take 1 tablet (80 mg total) by mouth every evening. 90 tablet 0   carvedilol (COREG) 6.25 MG tablet Take 1 tablet (6.25 mg total) by mouth 2 (two) times daily with a meal. 60 tablet 6   dabigatran (PRADAXA) 150 MG CAPS capsule Take 1 capsule (150 mg total) by mouth every 12 (twelve) hours. 60 capsule 3   dapagliflozin propanediol (FARXIGA) 10 MG TABS tablet Take 1 tablet (10 mg total) by mouth daily before breakfast. 30 tablet 6   glucose blood test strip Use as instructed 100 each 12   hydrALAZINE (APRESOLINE) 100 MG tablet Take 1 tablet (100 mg total) by mouth 3 (three) times daily. 90 tablet 11   insulin aspart (NOVOLOG) 100 UNIT/ML injection Substitute to any brand approved.Before each meal 3 times a day, 140-199 - 2 units, 200-250 - 4 units, 251-299 - 6 units,  300-349 - 8 units,  350 or above 10 units. Dispense syringes and needles as needed, Ok to switch to PEN if approved. DX DM2, Code E11.65 10 mL 0   isosorbide mononitrate (IMDUR) 60 MG 24 hr tablet Take 60 mg by mouth daily.     Lancets (ONETOUCH ULTRASOFT) lancets Check CBG twice a day 60 each 5   LANTUS 100 UNIT/ML injection INJECT 0.2  MLS (20 UNITS TOTAL) INTO THE SKIN EVERY MORNING. 10 mL 0   mexiletine (MEXITIL) 150 MG capsule TAKE 2 CAPSULES (300 MG TOTAL) BY MOUTH EVERY 12 (TWELVE) HOURS. 120 capsule 6   Multiple Vitamins-Minerals (WOMENS MULTI PO) Take 1 tablet by mouth daily.     potassium chloride (KLOR-CON) 10 MEQ tablet Take 1 tablet (10 mEq total) by mouth daily. 30 tablet 6   TRUEPLUS INSULIN SYRINGE 30G X 5/16" 0.5 ML MISC USE TO INJECT 12 UNITS AT BEDTIME. 100 each 6   No current facility-administered medications on file prior to visit.  Allergies:   Allergies  Allergen Reactions   Adhesive [Tape]     Tears skin, can tolerate paper tape      OBJECTIVE:  Physical Exam  Vitals:   03/03/21 0752  BP: 121/73  Pulse: (!) 54  Weight: 234 lb (106.1 kg)  Height: 5\' 8"  (1.727 m)   Body mass index is 35.58 kg/m. No exam data present   Post stroke PHQ 2/9 Depression screen PHQ 2/9 03/02/2021  Decreased Interest 0  Down, Depressed, Hopeless 0  PHQ - 2 Score 0  Altered sleeping 0  Tired, decreased energy 0  Change in appetite 0  Feeling bad or failure about yourself  0  Trouble concentrating 0  Moving slowly or fidgety/restless 0  Suicidal thoughts 0  PHQ-9 Score 0  Difficult doing work/chores -     General: well developed, well nourished,  pleasant middle-aged African-American female, seated, in no evident distress Head: head normocephalic and atraumatic.   Neck: supple with no carotid or supraclavicular bruits Cardiovascular: regular rate and rhythm, no murmurs Musculoskeletal: no deformity Skin:  no rash/petichiae Vascular:  Normal pulses all extremities   Neurologic Exam Mental Status: Awake and fully alert. Fluent speech and language. Oriented to place and time. Recent memory subjectively impaired and remote memory intact. Attention span, concentration and fund of knowledge appropriate during visit. Mood and affect appropriate.  Cranial Nerves: Fundoscopic exam reveals sharp  disc margins. Pupils equal, briskly reactive to light. Extraocular movements full without nystagmus. Visual fields full to confrontation. Hearing intact. Facial sensation intact. Face, tongue, palate moves normally and symmetrically.  Motor: Normal strength, bulk and tone on right side. LUE: 4/5 with decreased hand grip and increased tone LLE 3-4/5 hip flexor otherwise 4-5/5 Sensory.: intact to touch , pinprick , position and vibratory sensation.  Coordination: Rapid alternating movements normal in all extremities except decreased left hand. Finger-to-nose and heel-to-shin performed accurately on right side.  Orbits right arm over left arm Gait and Station: Arises from chair with mild difficulty. Stance is normal. Gait demonstrates  broad-based gait with mild unsteadiness and imbalance and use of cane.  Tandem walk not attempted. Romberg positive Reflexes: 1+ and symmetric. Toes downgoing.     NIHSS  1 Modified Rankin  2-3      ASSESSMENT: Adrienne Lane is a 55 y.o. year old female presented with left hemiparesis, left facial droop and bilateral floaters and peripheral vision on 12/24/2020 with stroke work-up revealing right pontine lacunar infarct likely secondary to small vessel disease as well as additional small acute infarct in the left frontal subcortical white matter and chronic right CR and left parietal white matter infarcts. Vascular risk factors include prior strokes on imaging, HTN, HLD, DM, A. Fib, CAD, OSA and CHF.      PLAN:  1. R pontine stroke :  a. Residual deficit: Mild left hemiparesis, gait impairment with imbalance, subjective short-term memory difficulties and fatigue.  Multiple questions regarding residual deficits and reassured likely all related to her stroke -discussed typical recovery timeline.  Encourage continued participation with neuro rehab PT/OT and use of cane at all times unless otherwise instructed b. Continue aspirin 81 mg daily and Pradaxa (dabigatran)  twice a day  and atorvastatin 80 mg daily for secondary stroke prevention.   c. Discussed secondary stroke prevention measures and importance of close PCP follow up for aggressive stroke risk factor management  2. Atrial fibrillation: On Pradaxa with CHA2DS2-VASc score of at least 6.  Followed routinely by cardiology 3. HTN:  BP goal <130/90.  Stable on amlodipine, hydralazine, carvedilol and Imdur per PCP 4. HLD: LDL goal <70. Recent LDL 173 on atorvastatin 40 mg daily therefore increased to 80 mg daily during recent stroke admission.  Has follow-up with PCP next month for repeat lab work -request statin refills provided by PCP 5. DMII: A1c goal<7.0. Recent A1c 8.0.  On farxiga, NovoLog and Lantus per PCP 6. OSA: will plan on sleep referral once Medicaid approved.  Discussed underlying untreated sleep apnea could be contributing to excessive fatigue complaints as well as increased risk of reoccurring strokes and cardiovascular disease    Follow up in 3 months or call earlier if needed   CC:  [facility] provider: Dr. Desma Maxim, Amy J, NP    I spent 45 minutes of face-to-face and non-face-to-face time with patient.  This included previsit chart review including recent hospitalization pertinent progress notes, lab work and imaging, lab review, study review, order entry, electronic health record documentation, patient education regarding recent stroke and etiology, residual deficits, importance of managing stroke risk factors and answered all other multiple questions to patient satisfaction   Frann Rider, AGNP-BC  [facility] [address] [place], [place] [address]  Phone [phone] Fax [phone] Note: This document was prepared with digital dictation and possible smart phrase technology. Any transcriptional errors that result from this process are unintentional.

## 2021-03-03 NOTE — Patient Instructions (Signed)
Continue working with therapies for likely on going improvement   Consider doing a sleep evaluation once you get medicaid approval - please call office for further orders  Continue aspirin 81 mg daily and Pradaxa (dabigatran) twice a day  and atorvastatin 80mg  daily  for secondary stroke prevention  Continue to follow up with PCP regarding cholesterol, blood pressure and diabetes management  Maintain strict control of hypertension with blood pressure goal below 130/90, diabetes with hemoglobin A1c goal below 7% and cholesterol with LDL cholesterol (bad cholesterol) goal below 70 mg/dL.       Followup in the future with me in 3 months or call earlier if needed       Thank you for coming to see [place] at [facility]. I hope we have been able to provide you high quality care today.  You may receive a patient satisfaction survey over the next few weeks. We would appreciate your feedback and comments so that we may continue to improve ourselves and the health of our patients.     Stroke Prevention Some medical conditions and lifestyle choices can lead to a higher risk for a stroke. You can help to prevent a stroke by eating healthy foods and exercising. It also helps to not smoke and to manage any health problems you may have. How can this condition affect me? A stroke is an emergency. It should be treated right away. A stroke can lead to brain damage or threaten your life. There is a better chance of surviving and getting better after a stroke if you get medical help right away. What can increase my risk? The following medical conditions may increase your risk of a stroke:  Diseases of the heart and blood vessels (cardiovascular disease).  High blood pressure (hypertension).  Diabetes.  High cholesterol.  Sickle cell disease.  Problems with blood clotting.  Being very overweight.  Sleeping problems (obstructivesleep apnea). Other risk factors  include:  Being older than age 64.  A history of blood clots, stroke, or mini-stroke (TIA).  Race, ethnic background, or a family history of stroke.  Smoking or using tobacco products.  Taking birth control pills, especially if you smoke.  Heavy alcohol and drug use.  Not being active. What actions can I take to prevent this? Manage your health conditions  High cholesterol. ? Eat a healthy diet. If this is not enough to manage your cholesterol, you may need to take medicines. ? Take medicines as told by your doctor.  High blood pressure. ? Try to keep your blood pressure below 130/80. ? If your blood pressure cannot be managed through a healthy diet and regular exercise, you may need to take medicines. ? Take medicines as told by your doctor. ? Ask your doctor if you should check your blood pressure at home. ? Have your blood pressure checked every year.  Diabetes. ? Eat a healthy diet and get regular exercise. If your blood sugar (glucose) cannot be managed through diet and exercise, you may need to take medicines. ? Take medicines as told by your doctor.  Talk to your doctor about getting checked for sleeping problems. Signs of a problem can include: ? Snoring a lot. ? Feeling very tired.  Make sure that you manage any other conditions you have. Nutrition  Follow instructions from your doctor about what to eat or drink. You may be told to: ? Eat and drink fewer calories each day. ? Limit how much salt (sodium) you use to 1,500  milligrams (mg) each day. ? Use only healthy fats for cooking, such as olive oil, canola oil, and sunflower oil. ? Eat healthy foods. To do this:  Choose foods that are high in fiber. These include whole grains, and fresh fruits and vegetables.  Eat at least 5 servings of fruits and vegetables a day. Try to fill one-half of your plate with fruits and vegetables at each meal.  Choose low-fat (lean) proteins. These include low-fat cuts of meat,  chicken without skin, fish, tofu, beans, and nuts.  Eat low-fat dairy products. ? Avoid foods that:  Are high in salt.  Have saturated fat.  Have trans fat.  Have cholesterol.  Are processed or pre-made. ? Count how many carbohydrates you eat and drink each day.   Lifestyle  If you drink alcohol: ? Limit how much you have to:  0-1 drink a day for women who are not pregnant.  0-2 drinks a day for men. ? Know how much alcohol is in your drink. In the [place] one drink equals one 12 oz bottle of beer (335mL), one 5 oz glass of wine (130mL), or one 1 oz glass of hard liquor (67mL).  Do not smoke or use any products that have nicotine or tobacco. If you need help quitting, ask your doctor.  Avoid secondhand smoke.  Do not use drugs. Activity  Try to stay at a healthy weight.  Get at least 30 minutes of exercise on most days, such as: ? Fast walking. ? Biking. ? Swimming.   Medicines  Take over-the-counter and prescription medicines only as told by your doctor.  Avoid taking birth control pills. Talk to your doctor about the risks of taking birth control pills if: ? You are over 53 years old. ? You smoke. ? You get very bad headaches. ? You have had a blood clot. Where to find more information  American Stroke Association: www.strokeassociation.org Get help right away if:  You or a loved one has any signs of a stroke. "BE FAST" is an easy way to remember the warning signs: ? B - Balance. Dizziness, sudden trouble walking, or loss of balance. ? E - Eyes. Trouble seeing or a change in how you see. ? F - Face. Sudden weakness or loss of feeling of the face. The face or eyelid may droop on one side. ? A - Arms. Weakness or loss of feeling in an arm. This happens all of a sudden and most often on one side of the body. ? S - Speech. Sudden trouble speaking, slurred speech, or trouble understanding what people say. ? T - Time. Time to call emergency services. Write down  what time symptoms started.  You or a loved one has other signs of a stroke, such as: ? A sudden, very bad headache with no known cause. ? Feeling like you may vomit (nausea). ? Vomiting. ? A seizure. These symptoms may be an emergency. Get help right away. Call your local emergency services (911 in the [place]  Do not wait to see if the symptoms will go away.  Do not drive yourself to the hospital. Summary  You can help to prevent a stroke by eating healthy, exercising, and not smoking. It also helps to manage any health problems you have.  Do not smoke or use any products that contain nicotine or tobacco.  Get help right away if you or a loved one has any signs of a stroke. This information is not intended to replace advice  given to you by your health care provider. Make sure you discuss any questions you have with your health care provider. Document Revised: 07/07/2020 Document Reviewed: 07/07/2020 Elsevier Patient Education  [place] Should Know Arm Care After A Stroke  After a Stroke or Brain Injury, it is common for your arm to come "out of socket"  This is because the muscles that hold your arm in your shoulder joint are weakened.  Your shoulder can be easily injured if it is moved improperly.  Injury to your shoulder is serious and can be prevented.   Never let your arm hang.  This may cause pain in the shoulder.  Support it on an arm rest or on pillows.  Never let anyone pull on your arm when helping you move in bed, during bathing or dressing, during bedpan placement or transfers.  Never let anyone grab under your arms to help pick you up or keep you from falling.  This may cause injury to your shoulder.  If you have any pain in your shoulder when you raise your arm, or when someone moves it for you, do not raise it past that point.   Performing movements at your shoulder should be done only after instruction from an occupational therapist, to be  sure that you are or your caregiver is doing it safely.  Support and move your arm by holding at the elbow.

## 2021-03-05 ENCOUNTER — Ambulatory Visit: Payer: Self-pay

## 2021-03-05 ENCOUNTER — Ambulatory Visit: Payer: Self-pay | Admitting: Occupational Therapy

## 2021-03-08 NOTE — Progress Notes (Signed)
I agree with the above plan 

## 2021-03-09 ENCOUNTER — Emergency Department (HOSPITAL_COMMUNITY)
Admission: EM | Admit: 2021-03-09 | Discharge: 2021-03-10 | Disposition: A | Discharge status: Home / Self Care | Payer: Self-pay | Attending: Emergency Medicine | Admitting: Emergency Medicine

## 2021-03-09 ENCOUNTER — Telehealth (HOSPITAL_COMMUNITY): Payer: Self-pay | Admitting: *Deleted

## 2021-03-09 ENCOUNTER — Encounter (HOSPITAL_COMMUNITY): Payer: Self-pay | Admitting: Emergency Medicine

## 2021-03-09 ENCOUNTER — Emergency Department (HOSPITAL_COMMUNITY): Payer: Self-pay

## 2021-03-09 DIAGNOSIS — Z7984 Long term (current) use of oral hypoglycemic drugs: Secondary | ICD-10-CM | POA: Insufficient documentation

## 2021-03-09 DIAGNOSIS — H538 Other visual disturbances: Secondary | ICD-10-CM | POA: Insufficient documentation

## 2021-03-09 DIAGNOSIS — N289 Disorder of kidney and ureter, unspecified: Secondary | ICD-10-CM | POA: Insufficient documentation

## 2021-03-09 DIAGNOSIS — E1140 Type 2 diabetes mellitus with diabetic neuropathy, unspecified: Secondary | ICD-10-CM | POA: Insufficient documentation

## 2021-03-09 DIAGNOSIS — Z7982 Long term (current) use of aspirin: Secondary | ICD-10-CM | POA: Insufficient documentation

## 2021-03-09 DIAGNOSIS — Z794 Long term (current) use of insulin: Secondary | ICD-10-CM | POA: Insufficient documentation

## 2021-03-09 DIAGNOSIS — D649 Anemia, unspecified: Secondary | ICD-10-CM | POA: Insufficient documentation

## 2021-03-09 DIAGNOSIS — Z79899 Other long term (current) drug therapy: Secondary | ICD-10-CM | POA: Insufficient documentation

## 2021-03-09 DIAGNOSIS — N184 Chronic kidney disease, stage 4 (severe): Secondary | ICD-10-CM | POA: Insufficient documentation

## 2021-03-09 DIAGNOSIS — Z7901 Long term (current) use of anticoagulants: Secondary | ICD-10-CM | POA: Insufficient documentation

## 2021-03-09 DIAGNOSIS — I130 Hypertensive heart and chronic kidney disease with heart failure and stage 1 through stage 4 chronic kidney disease, or unspecified chronic kidney disease: Secondary | ICD-10-CM | POA: Insufficient documentation

## 2021-03-09 DIAGNOSIS — I509 Heart failure, unspecified: Secondary | ICD-10-CM | POA: Insufficient documentation

## 2021-03-09 DIAGNOSIS — I4891 Unspecified atrial fibrillation: Secondary | ICD-10-CM | POA: Insufficient documentation

## 2021-03-09 LAB — COMPREHENSIVE METABOLIC PANEL
ALT: 23 U/L (ref 0–44)
AST: 24 U/L (ref 15–41)
Albumin: 3.3 g/dL — ABNORMAL LOW (ref 3.5–5.0)
Alkaline Phosphatase: 92 U/L (ref 38–126)
Anion gap: 7 (ref 5–15)
BUN: 25 mg/dL — ABNORMAL HIGH (ref 6–20)
CO2: 25 mmol/L (ref 22–32)
Calcium: 9 mg/dL (ref 8.9–10.3)
Chloride: 106 mmol/L (ref 98–111)
Creatinine, Ser: 1.94 mg/dL — ABNORMAL HIGH (ref 0.44–1.00)
GFR, Estimated: 30 mL/min — ABNORMAL LOW (ref >60)
Glucose, Bld: 262 mg/dL — ABNORMAL HIGH (ref 70–99)
Potassium: 3.8 mmol/L (ref 3.5–5.1)
Sodium: 138 mmol/L (ref 135–145)
Total Bilirubin: 0.6 mg/dL (ref 0.3–1.2)
Total Protein: 6.9 g/dL (ref 6.5–8.1)

## 2021-03-09 LAB — I-STAT BETA HCG BLOOD, ED (MC, WL, AP ONLY): I-stat hCG, quantitative: <5 m[IU]/mL (ref <5)

## 2021-03-09 LAB — PROTIME-INR
INR: 1.7 — ABNORMAL HIGH (ref 0.8–1.2)
Prothrombin Time: 19.1 seconds — ABNORMAL HIGH (ref 11.4–15.2)

## 2021-03-09 LAB — I-STAT CHEM 8, ED
BUN: 31 mg/dL — ABNORMAL HIGH (ref 6–20)
Calcium, Ion: 1.14 mmol/L — ABNORMAL LOW (ref 1.15–1.40)
Chloride: 105 mmol/L (ref 98–111)
Creatinine, Ser: 2.1 mg/dL — ABNORMAL HIGH (ref 0.44–1.00)
Glucose, Bld: 261 mg/dL — ABNORMAL HIGH (ref 70–99)
HCT: 32 % — ABNORMAL LOW (ref 36.0–46.0)
Hemoglobin: 10.9 g/dL — ABNORMAL LOW (ref 12.0–15.0)
Potassium: 4 mmol/L (ref 3.5–5.1)
Sodium: 141 mmol/L (ref 135–145)
TCO2: 27 mmol/L (ref 22–32)

## 2021-03-09 LAB — CBC
HCT: 33.7 % — ABNORMAL LOW (ref 36.0–46.0)
Hemoglobin: 11.4 g/dL — ABNORMAL LOW (ref 12.0–15.0)
MCH: 29.9 pg (ref 26.0–34.0)
MCHC: 33.8 g/dL (ref 30.0–36.0)
MCV: 88.5 fL (ref 80.0–100.0)
Platelets: 199 10*3/uL (ref 150–400)
RBC: 3.81 MIL/uL — ABNORMAL LOW (ref 3.87–5.11)
RDW: 14.4 % (ref 11.5–15.5)
WBC: 5.7 10*3/uL (ref 4.0–10.5)
nRBC: 0 % (ref 0.0–0.2)

## 2021-03-09 LAB — DIFFERENTIAL
Abs Immature Granulocytes: 0.01 10*3/uL (ref 0.00–0.07)
Basophils Absolute: 0 10*3/uL (ref 0.0–0.1)
Basophils Relative: 1 %
Eosinophils Absolute: 0.2 10*3/uL (ref 0.0–0.5)
Eosinophils Relative: 4 %
Immature Granulocytes: 0 %
Lymphocytes Relative: 25 %
Lymphs Abs: 1.4 10*3/uL (ref 0.7–4.0)
Monocytes Absolute: 0.5 10*3/uL (ref 0.1–1.0)
Monocytes Relative: 10 %
Neutro Abs: 3.5 10*3/uL (ref 1.7–7.7)
Neutrophils Relative %: 60 %

## 2021-03-09 LAB — APTT: aPTT: 67 seconds — ABNORMAL HIGH (ref 24–36)

## 2021-03-09 MED ORDER — SODIUM CHLORIDE 0.9% FLUSH: 3.0000 mL | Freq: Once | INTRAVENOUS | Status: DC

## 2021-03-09 NOTE — Telephone Encounter (Signed)
Pt left vm c/o tingling in hands and wrist. Pt recently had a stroke and has been doing ok until now. I called pt to see if she contacted her neurologist and she told me her pcp told her to go to the emergency room to be evaluated for stroke symptoms. Pt is going to the emergency room

## 2021-03-09 NOTE — ED Triage Notes (Signed)
Pt states she recently had a stroke in january 22, she mostly recovered after physical therapy. On 3/17 she noticed some of her sxs returned she began to have trouble seeing out of her left eye, she could not close her left hand completely and see feels like her equilibrium is off.

## 2021-03-10 ENCOUNTER — Emergency Department (HOSPITAL_COMMUNITY): Payer: Self-pay

## 2021-03-10 NOTE — ED Notes (Signed)
Pt transported to MRI 

## 2021-03-10 NOTE — Discharge Instructions (Signed)
Go to see the ophthalmologist tomorrow at 830.

## 2021-03-10 NOTE — ED Notes (Signed)
Pt refused to have an IV placed at this time.

## 2021-03-10 NOTE — ED Provider Notes (Signed)
[facility] EMERGENCY DEPARTMENT Provider Note   CSN: [id number] Arrival date & time: 03/09/21  1801   History Chief Complaint  Patient presents with  . Stroke Symptoms    Started 3/17    Adrienne Lane is a 55 y.o. female.  The history is provided by the patient.  She has history of hypertension, diabetes, hyperlipidemia, chronic kidney disease, stroke, paroxysmal atrial fibrillation anticoagulated on dabigatran and comes in because of concern for new stroke.  She had a stroke 2 months ago with residual left side weakness.  4 days ago, she noted blurred vision in her left eye.  This has been stable over the last 4 days.  She also noted that she was unable to close her right hand which she was able to do previously.  When she did try to close her hand, she would have pain which went into her left arm.  She denies any speech difficulty.  She has had an intermittent headache which is currently gone.  She decided to come to the emergency department when her pastor told her that she should come.  Past Medical History:  Diagnosis Date  . Atrial fibrillation with RVR ([place])   . [place] 01/2020  . CHF (congestive heart failure) ([place]) 10/20/2019  . CKD (chronic kidney disease), stage IV ([place])   . Diabetes mellitus without complication ([place])   . DOE (dyspnea on exertion)    walking upstairs or up hill resolves in one minute  . Fibroids   . History of kidney stones   . History of recent blood transfusion 02/26/2020  . Hypertension   . Iron deficiency anemia   . Non-ischemic cardiomyopathy (H[facility])    tachycardia induced  . Obese   . Peripheral edema   . Premature ventricular contractions (PVCs) ([place])   . Umbilical hernia   . Wears glasses     Patient Active Problem List   Diagnosis Date Noted  . Febrile illness, acute 01/03/2021  . Slow transit constipation   . Sore throat   . Irregular cardiac rhythm   . PAF (paroxysmal atrial fibrillation) ([place])   . Labile  blood pressure   . Hypokalemia   . Labile blood glucose   . Poorly controlled type 2 diabetes mellitus with peripheral neuropathy ([place])   . Stage 3b chronic kidney disease ([place])   . Right pontine cerebrovascular accident ([place]) 12/27/2020  . Acute CVA (cerebrovascular accident) ([place]) 12/24/2020  . Hyperlipidemia 10/24/2020  . Vitamin D deficiency 10/24/2020  . Fibroids 07/21/2020  . S/P TAH (total abdominal hysterectomy) 07/21/2020  . Chronic a-fib ([place]) 05/23/2020  . Hydroureter on right 05/23/2020  . Microalbuminuria 05/13/2020  . Non-ischemic cardiomyopathy ([place])   . Normocytic anemia   . Nephrolithiasis 02/14/2020  . Paroxysmal atrial fibrillation ([place]) 12/18/2019  . 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 Gross hematuria 12/18/2019  . Staghorn renal calculus 12/18/2019  . Umbilical hernia without obstruction and without gangrene 12/18/2019  . Uterine fibroid 10/25/2019  . Right hip pain   . Type 2 diabetes mellitus ([place])   . Essential hypertension   . CHF (congestive heart failure) ([place]) 10/21/2019  . Atrial fibrillation with RVR ([place]) 10/21/2019  . Atrial fibrillation with rapid ventricular response ([place]) 10/20/2019    Past Surgical History:  Procedure Laterality Date  . CHOLECYSTECTOMY    . CYSTOSCOPY W/ URETERAL STENT PLACEMENT Bilateral 05/24/2020   Procedure: CYSTOSCOPY WITH RETROGRADE PYELOGRAM/URETERAL STENT PLACEMENT;  Surgeon: Robley Fries, MD;  Location: [facility];  Service: Urology;  Laterality: Bilateral;  . CYSTOSCOPY/RETROGRADE/URETEROSCOPY Bilateral 04/03/2020   Procedure: CYSTOSCOPY/RETROGRADE/URETEROSCOPY;  Surgeon: Ardis Hughs, MD;  Location: [facility];  Service: Urology;  Laterality: Bilateral;  . HYSTERECTOMY ABDOMINAL WITH SALPINGO-OOPHORECTOMY  07/21/2020   Procedure: HYSTERECTOMY ABDOMINAL WITH BILATERAL SALPINGO-OOPHORECTOMY;  Surgeon: Sanjuana Kava, MD;  Location: [facility];  Service: Gynecology;;  . IR FLUORO GUIDE CV LINE RIGHT  02/21/2020  . IR US GUIDE VASC  ACCESS RIGHT  02/21/2020  . NEPHROLITHOTOMY Left 02/14/2020   Procedure: NEPHROLITHOTOMY PERCUTANEOUS/ SURGEON ACCESS/ LEFT PERCUTANEOUS NEPHROSTOMY TUBE PLACEMENT;  Surgeon: Ardis Hughs, MD;  Location: [facility];  Service: Urology;  Laterality: Left;  . NEPHROLITHOTOMY Left 02/21/2020   Procedure: NEPHROLITHOTOMY PERCUTANEOUS SECOND LOOK;  Surgeon: Ardis Hughs, MD;  Location: [facility];  Service: Urology;  Laterality: Left;  . NEPHROLITHOTOMY Left 02/26/2020   Procedure: NEPHROLITHOTOMY PERCUTANEOUS;  Surgeon: Ceasar Mons, MD;  Location: [facility];  Service: Urology;  Laterality: Left;  NEED 150 MIN  . NEPHROLITHOTOMY Right 03/24/2020   Procedure: NEPHROLITHOTOMY PERCUTANEOUS WITH ACCESS LEFT STENT REMOVAL;  Surgeon: Ardis Hughs, MD;  Location: [facility];  Service: Urology;  Laterality: Right;  . RIGHT HEART CATH N/A 11/09/2019   Procedure: RIGHT HEART CATH;  Surgeon: Jolaine Artist, MD;  Location: [facility];  Service: Cardiovascular;  Laterality: N/A;  . WISDOM TOOTH EXTRACTION       OB History    Gravida  0   Para  0   Term  0   Preterm  0   AB  0   Living  0     SAB  0   IAB  0   Ectopic  0   Multiple  0   Live Births  0           Family History  Problem Relation Age of Onset  . Atrial fibrillation Mother   . Hypertension Mother   . Stroke Mother 106  . Diabetes Mother   . Diabetes Father   . Hypertension Father   . Diabetes Sister   . Hypertension Sister   . Diabetes Brother   . Hypertension Brother   . Diabetes Brother   . Heart attack Brother   . Stroke Maternal Grandmother 72    Social History   Tobacco Use  . Smoking status: Never Smoker  . Smokeless tobacco: Never Used  Vaping Use  . Vaping Use: Never used  Substance Use Topics  . Alcohol use: Never  . Drug use: Never    Home Medications Prior to Admission medications   Medication Sig Start Date End Date Taking? Authorizing Provider  amLODipine  (NORVASC) 10 MG tablet Take 1 tablet (10 mg total) by mouth daily. 02/06/21   Larey Dresser, MD  aspirin 81 MG chewable tablet Chew 1 tablet (81 mg total) by mouth daily. 12/27/20 12/22/21  Kayleen Memos, DO  atorvastatin (LIPITOR) 80 MG tablet Take 1 tablet (80 mg total) by mouth every evening. 01/20/21 04/20/21  Nicolette Bang, DO  carvedilol (COREG) 6.25 MG tablet Take 1 tablet (6.25 mg total) by mouth 2 (two) times daily with a meal. 02/06/21   Larey Dresser, MD  dabigatran (PRADAXA) 150 MG CAPS capsule Take 1 capsule (150 mg total) by mouth every 12 (twelve) hours. 02/06/21   Bensimhon, Shaune Pascal, MD  dapagliflozin propanediol (FARXIGA) 10 MG TABS tablet Take 1 tablet (10 mg total) by mouth daily before breakfast. 06/02/20   Bensimhon, Shaune Pascal, MD  glucose blood test strip Use as instructed 11/19/19   Clegg, Amy D, NP  hydrALAZINE (APRESOLINE) 100 MG tablet Take 1 tablet (100 mg total) by mouth 3 (three) times daily. 02/17/21   Bensimhon, Shaune Pascal, MD  insulin aspart (NOVOLOG) 100 UNIT/ML injection Substitute to any brand approved.Before each meal 3 times a day, 140-199 - 2 units, 200-250 - 4 units, 251-299 - 6 units,  300-349 - 8 units,  350 or above 10 units. Dispense syringes and needles as needed, Ok to switch to PEN if approved. DX DM2, Code E11.65 01/06/21   Thurnell Lose, MD  isosorbide mononitrate (IMDUR) 60 MG 24 hr tablet Take 60 mg by mouth daily.    [provider]  Lancets [facility] ULTRASOFT) lancets Check CBG twice a day 01/21/21   Nicolette Bang, DO  LANTUS 100 UNIT/ML injection INJECT 0.2 MLS (20 UNITS TOTAL) INTO THE SKIN EVERY MORNING. 01/19/21   Charlott Rakes, MD  mexiletine (MEXITIL) 150 MG capsule TAKE 2 CAPSULES (300 MG TOTAL) BY MOUTH EVERY 12 (TWELVE) HOURS. 11/24/20   Clegg, Amy D, NP  Multiple Vitamins-Minerals (WOMENS MULTI PO) Take 1 tablet by mouth daily.    [provider]  potassium chloride (KLOR-CON) 10 MEQ tablet Take 1 tablet  (10 mEq total) by mouth daily. 02/06/21   Larey Dresser, MD  TRUEPLUS INSULIN SYRINGE 30G X 5/16" 0.5 ML MISC USE TO INJECT 12 UNITS AT BEDTIME. 11/24/20   Clegg, Amy D, NP    Allergies    Adhesive [tape]  Review of Systems   Review of Systems  All other systems reviewed and are negative.   Physical Exam Updated Vital Signs BP (!) 149/79 (BP Location: Right Arm)   Pulse 66   Temp 97.8 F (36.6 C) (Oral)   Resp 16   SpO2 98%   Physical Exam Vitals and nursing note reviewed.   55 year old female, resting comfortably and in no acute distress. Vital signs are significant for slightly elevated blood pressure. Oxygen saturation is 98%, which is normal. Head is normocephalic and atraumatic. PERRLA, EOMI. Oropharynx is clear.  Fundi are poorly visualized. Neck is nontender and supple without adenopathy or JVD.  There are no carotid bruits. Back is nontender and there is no CVA tenderness. Lungs are clear without rales, wheezes, or rhonchi. Chest is nontender. Heart has regular rate and rhythm without murmur. Abdomen is soft, flat, nontender without masses or hepatosplenomegaly and peristalsis is normoactive. Extremities have trace edema, full range of motion is present. Skin is warm and dry without rash. Neurologic: Mental status is normal.  Speech is normal.  There is no facial droop.  Tongue protrudes in the midline.  Soft palate moves symmetrically.  Visual fields are intact to confrontation.  There is no pronator drift.  There is mild weakness of grip on the left compared with the right, but elbow flexion and elbow extension are 5/5 bilaterally.  Hip flexion is 5/5 bilaterally.  ED Results / Procedures / Treatments   Labs (all labs ordered are listed, but only abnormal results are displayed) Labs Reviewed  PROTIME-INR - Abnormal; Notable for the following components:      Result Value   Prothrombin Time 19.1 (*)    INR 1.7 (*)    All other components within normal limits   APTT - Abnormal; Notable for the following components:   aPTT 67 (*)    All other components within normal limits  CBC - Abnormal; Notable for the  following components:   RBC 3.81 (*)    Hemoglobin 11.4 (*)    HCT 33.7 (*)    All other components within normal limits  COMPREHENSIVE METABOLIC PANEL - Abnormal; Notable for the following components:   Glucose, Bld 262 (*)    BUN 25 (*)    Creatinine, Ser 1.94 (*)    Albumin 3.3 (*)    GFR, Estimated 30 (*)    All other components within normal limits  I-STAT CHEM 8, ED - Abnormal; Notable for the following components:   BUN 31 (*)    Creatinine, Ser 2.10 (*)    Glucose, Bld 261 (*)    Calcium, Ion 1.14 (*)    Hemoglobin 10.9 (*)    HCT 32.0 (*)    All other components within normal limits  DIFFERENTIAL  I-STAT BETA HCG BLOOD, ED ([facility], [facility], [facility] ONLY)  CBG MONITORING, ED    EKG EKG Interpretation  Date/Time:  Monday March 09 2021 18:19:42 EDT Ventricular Rate:  93 PR Interval:  140 QRS Duration: 84 QT Interval:  378 QTC Calculation: 469 R Axis:   -7 Text Interpretation: Sinus rhythm with marked sinus arrhythmia with frequent Premature ventricular complexes Nonspecific ST and T wave abnormality Prolonged QT Abnormal ECG When compared with ECG of 01/22/2021, QT has lengthened Confirmed by Delora Fuel ([id number]) on 03/10/2021 2:28:39 AM   Radiology CT HEAD WO CONTRAST  Result Date: 03/09/2021 CLINICAL DATA:  Neuro deficit, acute stroke suspected. trouble seeing out of her left eye, she could not close her left hand EXAM: CT HEAD WITHOUT CONTRAST TECHNIQUE: Contiguous axial images were obtained from the base of the skull through the vertex without intravenous contrast. COMPARISON:  MR head 12/24/2020 FINDINGS: Brain: Chronic right lacunar basal ganglia infarction again noted. No evidence of large-territorial acute infarction. No parenchymal hemorrhage. No mass lesion. No extra-axial collection. No mass effect or midline shift. No  hydrocephalus. Basilar cisterns are patent. Vascular: No hyperdense vessel. Atherosclerotic calcifications are present within the cavernous internal carotid and vertebral arteries. Skull: No acute fracture or focal lesion. Sinuses/Orbits: Paranasal sinuses and mastoid air cells are clear. The orbits are unremarkable. Other: None. IMPRESSION: No acute intracranial abnormality. Electronically Signed   By: Iven Finn M.D.   On: 03/09/2021 19:23    Procedures Procedures   Medications Ordered in ED Medications  sodium chloride flush (NS) 0.9 % injection 3 mL (has no administration in time range)    ED Course  I have reviewed the triage vital signs and the nursing notes.  Pertinent labs & imaging results that were available during my care of the patient were reviewed by me and considered in my medical decision making (see chart for details).  MDM Rules/Calculators/A&P Blurred vision of the left eye, but no clear evidence of stroke on exam.  Labs show renal insufficiency which is higher than at discharge from the hospital in February, but similar to level seen on hospital admission.  Mild anemia is present not significantly changed from baseline.  CT scan of the head shows no acute infarction.  Will send for MRI to rule out acute stroke.  Of note, patient's symptoms have been present for 4 days which places her well outside the window for any stroke intervention and while outside the window for code stroke activation.  Case is signed out to Dr. Alvino Chapel.  Final Clinical Impression(s) / ED Diagnoses Final diagnoses:  Blurred vision, left eye  Chronic anticoagulation  Renal insufficiency  Normochromic normocytic anemia  Rx / DC Orders ED Discharge Orders    None       Delora Fuel, MD 95/39/67 [phone]

## 2021-03-10 NOTE — ED Provider Notes (Signed)
  Physical Exam  BP (!) 158/92   Pulse 63   Temp 97.8 F (36.6 C) (Oral)   Resp 20   SpO2 96%   Physical Exam  ED Course/Procedures     Procedures  MDM  Received patient signout.  Blurred vision possible left-sided weakness.  Previous stroke.  Imaging reassuring including MRI.  Discussed with ophthalmology, can be seen in the office today 30 tomorrow.  Will discharge       Davonna Belling, MD 03/10/21 [phone]

## 2021-03-11 ENCOUNTER — Ambulatory Visit: Payer: Self-pay | Admitting: Occupational Therapy

## 2021-03-11 ENCOUNTER — Telehealth: Payer: Self-pay | Admitting: Adult Health

## 2021-03-11 ENCOUNTER — Ambulatory Visit: Payer: Self-pay | Admitting: Physical Therapy

## 2021-03-11 ENCOUNTER — Encounter: Payer: Self-pay | Admitting: Occupational Therapy

## 2021-03-11 ENCOUNTER — Other Ambulatory Visit: Payer: Self-pay

## 2021-03-11 ENCOUNTER — Encounter: Payer: Self-pay | Admitting: Physical Therapy

## 2021-03-11 ENCOUNTER — Telehealth: Payer: Self-pay | Admitting: *Deleted

## 2021-03-11 DIAGNOSIS — M6281 Muscle weakness (generalized): Secondary | ICD-10-CM

## 2021-03-11 DIAGNOSIS — R2689 Other abnormalities of gait and mobility: Secondary | ICD-10-CM

## 2021-03-11 DIAGNOSIS — R2681 Unsteadiness on feet: Secondary | ICD-10-CM

## 2021-03-11 DIAGNOSIS — M25512 Pain in left shoulder: Secondary | ICD-10-CM

## 2021-03-11 DIAGNOSIS — R41842 Visuospatial deficit: Secondary | ICD-10-CM

## 2021-03-11 DIAGNOSIS — R278 Other lack of coordination: Secondary | ICD-10-CM

## 2021-03-11 DIAGNOSIS — M25612 Stiffness of left shoulder, not elsewhere classified: Secondary | ICD-10-CM

## 2021-03-11 NOTE — Telephone Encounter (Signed)
Spoke to pt.  She wanted to let you know that she had/has sx of L blurry vision since 03-05-21.  No pain.  Had this 2 days prior and 2 days after stroke (along with floaters).  Was not there when seen here, but is back since 03-05-21.  She was also experiencing some offbalanceness, and L hand (could not close w/o pain)w/ sharp shooting pains.  Per OT,  She was told some strategies (not use putty) and other things to rest this.  Balance was better today.  I gave her Dr. Zenia Resides name to have her eyes checked.  She has medicaid pending.  (not sure when they make determination).  FYI only, unless recommendations.

## 2021-03-11 NOTE — Patient Instructions (Signed)
Oculomotor: Saccades    Holding two targets positioned side by side _approx 12  inches apart, move eyes quickly from target to target as head stays still. Move _10 times each direction. Perform sitting. Do _3-4  sessions per day.  Copyright  VHI. All rights reserved.

## 2021-03-11 NOTE — Telephone Encounter (Signed)
Transition Care Management Unsuccessful Follow-up Telephone Call  Date of discharge and from where:  03/10/2021 [name]  Attempts:  1st Attempt  Reason for unsuccessful TCM follow-up call:  Left voice message

## 2021-03-11 NOTE — Patient Instructions (Signed)
Access Code: [email or web address] URL: [email or web address] Date: 03/11/2021 Prepared by: Willow Ora  Exercises Sit to Stand Without Arm Support - 1 x daily - 5 x weekly - 2 sets - 10 reps Supine Bridge - 1 x daily - 5 x weekly - 2 sets - 10 reps Heel Toe Raises with Counter Support - 1 x daily - 5 x weekly - 1 sets - 10 reps Tandem Walking with Counter Support - 1 x daily - 5 x weekly - 1 sets - 3 reps Walking March - 1 x daily - 5 x weekly - 1 sets - 3 reps

## 2021-03-11 NOTE — Therapy (Signed)
[facility] [address] [place], [place], [address] Phone: [phone]   Fax:  [phone]  Occupational Therapy Treatment  Patient Details  Name: Adrienne Lane MRN: [id number] Date of Birth: 29-Feb-1966 Referring Provider (OT): Lala Lund, MD   Encounter Date: 03/11/2021   OT End of Session - 03/11/21 1155    Visit Number 4    Number of Visits 9    Date for OT Re-Evaluation 03/30/21    Authorization Type Medicaid Pending    OT Start Time 1145    OT Stop Time 1230    OT Time Calculation (min) 45 min    Activity Tolerance Patient tolerated treatment well    Behavior During Therapy [facility] for tasks assessed/performed           Past Medical History:  Diagnosis Date  . Atrial fibrillation with RVR ([place])   . [place] 01/2020  . CHF (congestive heart failure) ([place]) 10/20/2019  . CKD (chronic kidney disease), stage IV ([place])   . Diabetes mellitus without complication ([place])   . DOE (dyspnea on exertion)    walking upstairs or up hill resolves in one minute  . Fibroids   . History of kidney stones   . History of recent blood transfusion 02/26/2020  . Hypertension   . Iron deficiency anemia   . Non-ischemic cardiomyopathy ([facility])    tachycardia induced  . Obese   . Peripheral edema   . Premature ventricular contractions (PVCs) ([facility])   . Umbilical hernia   . Wears glasses     Past Surgical History:  Procedure Laterality Date  . CHOLECYSTECTOMY    . CYSTOSCOPY W/ URETERAL STENT PLACEMENT Bilateral 05/24/2020   Procedure: CYSTOSCOPY WITH RETROGRADE PYELOGRAM/URETERAL STENT PLACEMENT;  Surgeon: Robley Fries, MD;  Location: [facility];  Service: Urology;  Laterality: Bilateral;  . CYSTOSCOPY/RETROGRADE/URETEROSCOPY Bilateral 04/03/2020   Procedure: CYSTOSCOPY/RETROGRADE/URETEROSCOPY;  Surgeon: Ardis Hughs, MD;  Location: [facility];  Service: Urology;  Laterality: Bilateral;  . HYSTERECTOMY ABDOMINAL WITH  SALPINGO-OOPHORECTOMY  07/21/2020   Procedure: HYSTERECTOMY ABDOMINAL WITH BILATERAL SALPINGO-OOPHORECTOMY;  Surgeon: Sanjuana Kava, MD;  Location: [facility];  Service: Gynecology;;  . IR FLUORO GUIDE CV LINE RIGHT  02/21/2020  . IR US GUIDE VASC ACCESS RIGHT  02/21/2020  . NEPHROLITHOTOMY Left 02/14/2020   Procedure: NEPHROLITHOTOMY PERCUTANEOUS/ SURGEON ACCESS/ LEFT PERCUTANEOUS NEPHROSTOMY TUBE PLACEMENT;  Surgeon: Ardis Hughs, MD;  Location: [facility];  Service: Urology;  Laterality: Left;  . NEPHROLITHOTOMY Left 02/21/2020   Procedure: NEPHROLITHOTOMY PERCUTANEOUS SECOND LOOK;  Surgeon: Ardis Hughs, MD;  Location: [facility];  Service: Urology;  Laterality: Left;  . NEPHROLITHOTOMY Left 02/26/2020   Procedure: NEPHROLITHOTOMY PERCUTANEOUS;  Surgeon: Ceasar Mons, MD;  Location: [facility];  Service: Urology;  Laterality: Left;  NEED 150 MIN  . NEPHROLITHOTOMY Right 03/24/2020   Procedure: NEPHROLITHOTOMY PERCUTANEOUS WITH ACCESS LEFT STENT REMOVAL;  Surgeon: Ardis Hughs, MD;  Location: [facility];  Service: Urology;  Laterality: Right;  . RIGHT HEART CATH N/A 11/09/2019   Procedure: RIGHT HEART CATH;  Surgeon: Jolaine Artist, MD;  Location: [facility];  Service: Cardiovascular;  Laterality: N/A;  . WISDOM TOOTH EXTRACTION      There were no vitals filed for this visit.   Subjective Assessment - 03/11/21 1152    Subjective  "I was doing better but now I am having a lot of blurriness with this (left) eye and my left hand."    Pertinent History R [place]  CVA, DM, HTN, CKD, CHF    Limitations fall risk. not driving.    Patient Stated Goals get stronger and get back to normal    Currently in Pain? Yes    Pain Score 4     Pain Location Wrist    Pain Orientation Left    Pain Descriptors / Indicators Shooting    Pain Type Neuropathic pain;Acute pain    Pain Onset In the past 7 days    Pain Frequency Occasional    Aggravating Factors  ulnar deviation              [facility] OT Assessment - 03/11/21 1222      Vision Assessment   Alignment/Gaze Preference Other (comment)   gaze is normal. Left eye opened bigger than right.   Tracking/Visual Pursuits Able to track stimulus in all quads without difficulty    Saccades Additional eye shifts occurred during testing   eye shifts with left eye with vertical saccades           TREATMENT  Manipulating golf balls: in LUE with mod difficulty and drops.   Hand Gripper: LUE with white spring on level 2 x 10 blocks with moderate difficulty. Pt downgraded to level 1 with white spring for remainder of blocks.    Gross vision assessment: see above.                OT Education - 03/11/21 1230    Education Details saccades exercises    Person(s) Educated Patient    Methods Explanation;Demonstration    Comprehension Verbalized understanding;Returned demonstration            OT Short Term Goals - 03/11/21 1157      OT SHORT TERM GOAL #1   Title Pt will be independent with coordination HEP for LUE 03/02/2021    Baseline not issued yet    Time 4    Period Weeks    Status On-going    Target Date 03/02/21      OT SHORT TERM GOAL #2   Title Pt will demonstrate improved LUE functional use by improving Box and Blocks score to 42 or greater.    Baseline RUE 52 LUE 36    Time 4    Period Weeks    Status On-going   LUE 39 blocks 03/11/2021     OT SHORT TERM GOAL #3   Title Pt will improve grip strength in LUE to 18 lbs or greater for increase for clothing management and overall functional use.    Baseline RUE 44.7 LUE 13.8    Time 4    Period Weeks    Status On-going   13.8 lbs with LUE 03/11/2021     OT SHORT TERM GOAL #4   Title Pt will report increased speed and accuracy with basic ADLs (i.e. tying shoes, washing up, etc)    Time 4    Period Weeks    Status On-going   pt reports "its a little better" 02/23/21            OT Long Term Goals - 03/11/21 1200      OT LONG TERM GOAL #1    Title Pt will be independent with updated HEP for LUE strengthening and coordination    Baseline not issued yet    Time 8    Period Weeks    Status New      OT LONG TERM GOAL #2   Title Pt will improve LUE fine motor  coordination by completing 9 hole peg test with LUE in 50 seconds or less *REVISED: 30 seconds or less with LUE    Baseline LUE 68.37, RUE 24.5    Time 8    Period Weeks    Status Revised   03/11/2021 LUE 38.63s     OT LONG TERM GOAL #3   Title Pt will improve grip strength in LUE to 25 lbs or greater for increasing overall functional use of LUE.    Baseline LUE 13.8 RUE 44.7    Time 8    Period Weeks    Status New      OT LONG TERM GOAL #4   Title Pt will improve standing tolerance to 20 minutes or greater in order to increase ability to complete home management tasks with improved activity tolerance    Baseline 10 minutes before requiring break    Time 8    Period Weeks    Status New                 Plan - 03/11/21 1219    Clinical Impression Statement Pt arrived today with complaints of new vision changes and decrease in grip in LUE. Scores on all assessments have not declined. Will continue to assess.    OT Occupational Profile and History Problem Focused Assessment - Including review of records relating to presenting problem    Occupational performance deficits (Please refer to evaluation for details): ADL's;IADL's;Leisure    Body Structure / Function / Physical Skills ADL;IADL;Coordination;FMC;ROM;UE functional use;GMC;Vision;Strength;Dexterity;Decreased knowledge of use of DME;Balance;Pain    Rehab Potential Good    Clinical Decision Making Limited treatment options, no task modification necessary    Comorbidities Affecting Occupational Performance: None    Modification or Assistance to Complete Evaluation  No modification of tasks or assist necessary to complete eval    OT Frequency 1x / week    OT Duration 8 weeks   8 visits + eval   OT  Treatment/Interventions Self-care/ADL training;Cryotherapy;Fluidtherapy;DME and/or AE instruction;Passive range of motion;Manual Therapy;Functional Mobility Training;Neuromuscular education;Moist Heat;Therapeutic exercise;Patient/family education;Therapeutic activities;Visual/perceptual remediation/compensation;Energy conservation;Paraffin    Plan continue LUE grip strength and coordination.    Consulted and Agree with Plan of Care Patient           Patient will benefit from skilled therapeutic intervention in order to improve the following deficits and impairments:   Body Structure / Function / Physical Skills: ADL,IADL,Coordination,FMC,ROM,UE functional use,GMC,Vision,Strength,Dexterity,Decreased knowledge of use of DME,Balance,Pain       Visit Diagnosis: Acute pain of left shoulder  Muscle weakness (generalized)  Other abnormalities of gait and mobility  Visuospatial deficit  Other lack of coordination  Stiffness of left shoulder, not elsewhere classified    Problem List Patient Active Problem List   Diagnosis Date Noted  . Febrile illness, acute 01/03/2021  . Slow transit constipation   . Sore throat   . Irregular cardiac rhythm   . PAF (paroxysmal atrial fibrillation) ([place])   . Labile blood pressure   . Hypokalemia   . Labile blood glucose   . Poorly controlled type 2 diabetes mellitus with peripheral neuropathy ([place])   . Stage 3b chronic kidney disease ([place])   . Right pontine cerebrovascular accident ([place]) 12/27/2020  . Acute CVA (cerebrovascular accident) ([place]) 12/24/2020  . Hyperlipidemia 10/24/2020  . Vitamin D deficiency 10/24/2020  . Fibroids 07/21/2020  . S/P TAH (total abdominal hysterectomy) 07/21/2020  . Chronic a-fib ([place]) 05/23/2020  . Hydroureter on right 05/23/2020  . Microalbuminuria 05/13/2020  .  Non-ischemic cardiomyopathy ([place])   . Normocytic anemia   . Nephrolithiasis 02/14/2020  . Paroxysmal atrial fibrillation ([place]) 12/18/2019  .  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 Gross hematuria 12/18/2019  . Staghorn renal calculus 12/18/2019  . Umbilical hernia without obstruction and without gangrene 12/18/2019  . Uterine fibroid 10/25/2019  . Right hip pain   . Type 2 diabetes mellitus ([place])   . Essential hypertension   . CHF (congestive heart failure) ([place]) 10/21/2019  . Atrial fibrillation with RVR ([place]) 10/21/2019  . Atrial fibrillation with rapid ventricular response [facility]) 10/20/2019    Zachery Conch MOT, OTR/L  03/11/2021, 12:31 PM  [facility] [address] [place], [place], [address] Phone: [phone]   Fax:  [phone]  Name: Audrena Talaga MRN: [id number] Date of Birth: 16-Oct-1966

## 2021-03-11 NOTE — Telephone Encounter (Signed)
Pt. Came In today stating that she has started feeling different symptoms than what she had on the 15th at appt. Her left Eye is blurry and her left hand is not closing all the way. She went to the ER to rule out another stroke they did a CT and MRI in ER and it was not a stoke. Er told her to go to an optimologist but due to her not having insurance or a way to pay she wants to know if Janett Billow Can refer her to someone.She also states that her equilibrium seems to be off. Best call back is [phone]

## 2021-03-12 NOTE — Telephone Encounter (Signed)
Transition Care Management Unsuccessful Follow-up Telephone Call  Date of discharge and from where:  03/10/2021 [name]  Attempts:  2nd Attempt  Reason for unsuccessful TCM follow-up call:  Left voice message

## 2021-03-12 NOTE — Therapy (Signed)
[facility] [address] [place], [place], [address] Phone: [phone]   Fax:  [phone]  Physical Therapy Treatment  Patient Details  Name: Adrienne Lane MRN: [id number] Date of Birth: 1966-11-01 Referring Provider (PT): Referred by: Thurnell Lose, MD. PCP is Nicolette Bang, DO   Encounter Date: 03/11/2021   PT End of Session - 03/11/21 1236    Visit Number 4    Number of Visits 9    Date for PT Re-Evaluation --   Re-Cert on 9th Visit   Authorization Type Medicaid (Pending)    PT Start Time 1233    PT Stop Time 1315    PT Time Calculation (min) 42 min    Equipment Utilized During Treatment Gait belt    Activity Tolerance Patient tolerated treatment well    Behavior During Therapy [facility] for tasks assessed/performed           Past Medical History:  Diagnosis Date  . Atrial fibrillation with RVR ([place])   . [place] 01/2020  . CHF (congestive heart failure) ([place]) 10/20/2019  . CKD (chronic kidney disease), stage IV ([place])   . Diabetes mellitus without complication ([place])   . DOE (dyspnea on exertion)    walking upstairs or up hill resolves in one minute  . Fibroids   . History of kidney stones   . History of recent blood transfusion 02/26/2020  . Hypertension   . Iron deficiency anemia   . Non-ischemic cardiomyopathy ([facility])    tachycardia induced  . Obese   . Peripheral edema   . Premature ventricular contractions (PVCs) ([facility])   . Umbilical hernia   . Wears glasses     Past Surgical History:  Procedure Laterality Date  . CHOLECYSTECTOMY    . CYSTOSCOPY W/ URETERAL STENT PLACEMENT Bilateral 05/24/2020   Procedure: CYSTOSCOPY WITH RETROGRADE PYELOGRAM/URETERAL STENT PLACEMENT;  Surgeon: Robley Fries, MD;  Location: [facility];  Service: Urology;  Laterality: Bilateral;  . CYSTOSCOPY/RETROGRADE/URETEROSCOPY Bilateral 04/03/2020   Procedure: CYSTOSCOPY/RETROGRADE/URETEROSCOPY;  Surgeon:  Ardis Hughs, MD;  Location: [facility];  Service: Urology;  Laterality: Bilateral;  . HYSTERECTOMY ABDOMINAL WITH SALPINGO-OOPHORECTOMY  07/21/2020   Procedure: HYSTERECTOMY ABDOMINAL WITH BILATERAL SALPINGO-OOPHORECTOMY;  Surgeon: Sanjuana Kava, MD;  Location: [facility];  Service: Gynecology;;  . IR FLUORO GUIDE CV LINE RIGHT  02/21/2020  . IR US GUIDE VASC ACCESS RIGHT  02/21/2020  . NEPHROLITHOTOMY Left 02/14/2020   Procedure: NEPHROLITHOTOMY PERCUTANEOUS/ SURGEON ACCESS/ LEFT PERCUTANEOUS NEPHROSTOMY TUBE PLACEMENT;  Surgeon: Ardis Hughs, MD;  Location: [facility];  Service: Urology;  Laterality: Left;  . NEPHROLITHOTOMY Left 02/21/2020   Procedure: NEPHROLITHOTOMY PERCUTANEOUS SECOND LOOK;  Surgeon: Ardis Hughs, MD;  Location: [facility];  Service: Urology;  Laterality: Left;  . NEPHROLITHOTOMY Left 02/26/2020   Procedure: NEPHROLITHOTOMY PERCUTANEOUS;  Surgeon: Ceasar Mons, MD;  Location: [facility];  Service: Urology;  Laterality: Left;  NEED 150 MIN  . NEPHROLITHOTOMY Right 03/24/2020   Procedure: NEPHROLITHOTOMY PERCUTANEOUS WITH ACCESS LEFT STENT REMOVAL;  Surgeon: Ardis Hughs, MD;  Location: [facility];  Service: Urology;  Laterality: Right;  . RIGHT HEART CATH N/A 11/09/2019   Procedure: RIGHT HEART CATH;  Surgeon: Jolaine Artist, MD;  Location: [facility];  Service: Cardiovascular;  Laterality: N/A;  . WISDOM TOOTH EXTRACTION      There were no vitals filed for this visit.   Subjective Assessment - 03/11/21 1231    Subjective Has been seen in the ED  since last session due to increased CVA like symptoms. New CVA was ruled out. They did not give her a reason for the new symptoms. Now feeling more off balance with worse vision in left eye and increased lightheadedness. No falls. Some left wrist pain.    Pertinent History CVA, Diabetes, CKD Stage 3, A. fib, CHF, HTN, , Normocytic anemia, Hyperlipidemia, COVID - 19    Limitations Standing;Walking;House hold  activities    How long can you walk comfortably? 10 minutes    Patient Stated Goals get strength back in LLE    Currently in Pain? Yes    Pain Score 4    with movements only, 0/10 at rest   Pain Location Wrist    Pain Orientation Left    Pain Descriptors / Indicators Shooting    Pain Type Neuropathic pain;Acute pain    Pain Onset In the past 7 days    Pain Frequency Occasional    Aggravating Factors  ulnar deviation    Pain Relieving Factors opening hand, heat              [facility] PT Assessment - 03/11/21 1236      Berg Balance Test   Sit to Stand Able to stand  independently using hands   hands pressing down on knees   Standing Unsupported Able to stand safely 2 minutes    Sitting with Back Unsupported but Feet Supported on Floor or Stool Able to sit safely and securely 2 minutes    Stand to Sit Sits safely with minimal use of hands    Transfers Able to transfer safely, minor use of hands    Standing Unsupported with Eyes Closed Able to stand 10 seconds with supervision    Standing Unsupported with Feet Together Able to place feet together independently and stand 1 minute safely    From Standing, Reach Forward with Outstretched Arm Can reach confidently >25 cm (10")    From Standing Position, Pick up Object from Floor Able to pick up shoe safely and easily    From Standing Position, Turn to Look Behind Over each Shoulder Looks behind from both sides and weight shifts well    Turn 360 Degrees Able to turn 360 degrees safely but slowly   4-5 sec's each way   Standing Unsupported, Alternately Place Feet on Step/Stool Able to stand independently and safely and complete 8 steps in 20 seconds   17.84   Standing Unsupported, One Foot in Front Able to plae foot ahead of the other independently and hold 30 seconds    Standing on One Leg Tries to lift leg/unable to hold 3 seconds but remains standing independently    Total Score 48    Berg comment: 48/56= moderate risk for falls               [facility] Adult PT Treatment/Exercise - 03/11/21 1236      Transfers   Transfers Sit to Stand;Stand to Sit    Sit to Stand 5: Supervision    Five time sit to stand comments  19.69 with UE support from standard height chair    Stand to Sit 5: Supervision      Ambulation/Gait   Ambulation/Gait Yes    Ambulation/Gait Assistance 5: Supervision;4: Min guard    Ambulation/Gait Assistance Details around gym with session with no AD. use of cane to enter/exit the session. supervision to min guard assist with long distance gait for safety with minor veering noted at times.  Ambulation Distance (Feet) 550 Feet   x1 no AD   Assistive device Straight cane;None    Gait Pattern Step-through pattern;Decreased arm swing - left;Decreased stance time - left;Decreased dorsiflexion - left;Decreased weight shift to left;Poor foot clearance - left    Ambulation Surface Level;Indoor    Gait velocity 10.90 sec's= 3.0 ft/sec no AD      Self-Care   Self-Care Other Self-Care Comments    Other Self-Care Comments  reviewed HEP. reports the clamshell and marching are getting easy with red band. Advanced HEP this session. Refer to [place] for full details. Min guard assist for safety with cues on form/technique.          Issued the following to HEP today: Access Code: [email or web address] URL: [email or web address] Date: 03/11/2021 Prepared by: Willow Ora  Exercises Sit to Stand Without Arm Support - 1 x daily - 5 x weekly - 2 sets - 10 reps Supine Bridge - 1 x daily - 5 x weekly - 2 sets - 10 reps Heel Toe Raises with Counter Support - 1 x daily - 5 x weekly - 1 sets - 10 reps Tandem Walking with Counter Support - 1 x daily - 5 x weekly - 1 sets - 3 reps Walking March - 1 x daily - 5 x weekly - 1 sets - 3 reps        PT Education - 03/11/21 1957    Education Details progress toward goals, updated HEP    Person(s) Educated Patient    Methods Explanation;Demonstration;Verbal cues;Handout     Comprehension Verbalized understanding;Returned demonstration;Verbal cues required;Need further instruction            PT Short Term Goals - 03/12/21 2152      PT SHORT TERM GOAL #1   Title Patient will be independent with initial HEP for balance/strengthening (STGs Due at 5th Visit)    Baseline 03/11/21: met with current HEP, advanced HEP this session    Status Achieved      PT SHORT TERM GOAL #2   Title Patient will improve 5x sit <> stand to </= 20 seconds to demonstrate improved balance    Baseline 03/11/21: 19.69 sec's with UE support from standard height chair    Status Achieved      PT SHORT TERM GOAL #3   Title Patient will imrpove Berg Balance to >/= 46/56 to demonstrate reduced fall risk and improved balance    Baseline 03/11/21: 48/56 scored this session    Status Achieved      PT SHORT TERM GOAL #4   Title Patient will demo ability to ambulate >/= 500 ft with supervision without AD to demonstrate improved household mobility    Baseline 03/11/21: met distance portion of goal with supervision to min guard assist no AD    Status Partially Met             PT Long Term Goals - 02/02/21 1453      PT LONG TERM GOAL #1   Title Patient will be independent with Final HEP for strengthening/balance (ALL LTGs Due at 9th Visit)    Baseline no HEP established    Time 8    Period --   visits   Status New      PT LONG TERM GOAL #2   Title Patient will improve TUG to </= 10 seconds to demo improved balance and reduced fall risk    Baseline 12.25 secs    Time 8    Period --  visits   Status New      PT LONG TERM GOAL #3   Title Patient will improve Berg Balance to >/= 50/56 to demonstrate decreased fall risk and improved balance    Baseline 43/56    Time 8    Period --   visits   Status New      PT LONG TERM GOAL #4   Title Patient will improve gait speed to >/= 2.75 ft/sec to demonstrate improved community ambulation    Baseline 2.42 ft/sec    Time 8    Period --    visits   Status New      PT LONG TERM GOAL #5   Title Patient will improve 5x sit <> stand to </= 15 seconds to demonstrate reduce fall risk and improved balance    Baseline 24.72 secs    Time 8    Period --   visits   Status New      Additional Long Term Goals   Additional Long Term Goals Yes      PT LONG TERM GOAL #6   Title Patient will demo abilit yto ambulate >/= 500 ft on various outdoor surfaces for improved community mobility    Baseline TBA    Time 8    Period --   visits   Status New             03/11/21 1236  Plan  Clinical Impression Statement Today's skilled session focused on progress toward STGs. Goals were partially to fully met this session. Pt's Berg Balance Test score increased to 48/56, her 5 time sit to stand improved to 19.69 sec's with UE support and her gait speed increased to 3.0 ft/sec no AD. The pt is making steady progress toward goals and should benefit from continued PT to progress toward unmet goals.  Personal Factors and Comorbidities Comorbidity 3+  Comorbidities CVA, Diabetes, CKD Stage 3, A. fib, CHF, HTN, , Normocytic anemia, Hyperlipidemia, COVID - 19  Examination-Activity Limitations Bed Mobility;Transfers;Stand;Stairs  Examination-Participation Restrictions Church;Community Activity;Laundry;Cleaning  Pt will benefit from skilled therapeutic intervention in order to improve on the following deficits Abnormal gait;Decreased coordination;Difficulty walking;Decreased range of motion;Decreased activity tolerance;Decreased balance;Decreased knowledge of use of DME;Decreased mobility;Decreased strength;Decreased endurance;Decreased safety awareness  Stability/Clinical Decision Making Evolving/Moderate complexity  Rehab Potential Good  PT Frequency 1x / week  PT Duration 8 weeks (plus eval)  PT Treatment/Interventions ADLs/Self Care Home Management;Aquatic Therapy;Cryotherapy;DME Instruction;Moist Heat;Electrical Stimulation;Gait training;Stair  training;Functional mobility training;Therapeutic activities;Balance training;Therapeutic exercise;Neuromuscular re-education;Patient/family education;Orthotic Fit/Training;Passive range of motion;Vestibular;Manual techniques  PT Next Visit Plan Continue  working on corner/high level balance. Activites to promote SLS. ? use of foot up brace with gait.  Consulted and Agree with Plan of Care Patient          Patient will benefit from skilled therapeutic intervention in order to improve the following deficits and impairments:  Abnormal gait,Decreased coordination,Difficulty walking,Decreased range of motion,Decreased activity tolerance,Decreased balance,Decreased knowledge of use of DME,Decreased mobility,Decreased strength,Decreased endurance,Decreased safety awareness  Visit Diagnosis: Muscle weakness (generalized)  Other abnormalities of gait and mobility  Unsteadiness on feet     Problem List Patient Active Problem List   Diagnosis Date Noted  . Febrile illness, acute 01/03/2021  . Slow transit constipation   . Sore throat   . Irregular cardiac rhythm   . PAF (paroxysmal atrial fibrillation) ([place])   . Labile blood pressure   . Hypokalemia   . Labile blood glucose   . Poorly controlled type 2  diabetes mellitus with peripheral neuropathy ([place])   . Stage 3b chronic kidney disease ([place])   . Right pontine cerebrovascular accident ([place]) 12/27/2020  . Acute CVA (cerebrovascular accident) ([place]) 12/24/2020  . Hyperlipidemia 10/24/2020  . Vitamin D deficiency 10/24/2020  . Fibroids 07/21/2020  . S/P TAH (total abdominal hysterectomy) 07/21/2020  . Chronic a-fib ([place]) 05/23/2020  . Hydroureter on right 05/23/2020  . Microalbuminuria 05/13/2020  . Non-ischemic cardiomyopathy ([place])   . Normocytic anemia   . Nephrolithiasis 02/14/2020  . Paroxysmal atrial fibrillation ([place]) 12/18/2019  . 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 Gross hematuria 12/18/2019  . Staghorn renal calculus 12/18/2019   . Umbilical hernia without obstruction and without gangrene 12/18/2019  . Uterine fibroid 10/25/2019  . Right hip pain   . Type 2 diabetes mellitus ([place])   . Essential hypertension   . CHF (congestive heart failure) ([place]) 10/21/2019  . Atrial fibrillation with RVR ([place]) 10/21/2019  . Atrial fibrillation with rapid ventricular response ([place]) 10/20/2019    Willow Ora, PTA, [facility] [address], [place], [place] [address] [phone] 03/12/21, 9:32 PM   Name: Adrienne Lane MRN: [id number] Date of Birth: 02/18/1966

## 2021-03-13 NOTE — Telephone Encounter (Signed)
Transition Care Management Unsuccessful Follow-up Telephone Call  Date of discharge and from where:  03/10/2021 [name]  Attempts:  3rd Attempt  Reason for unsuccessful TCM follow-up call:  Left voice message

## 2021-03-17 ENCOUNTER — Other Ambulatory Visit: Payer: Self-pay

## 2021-03-17 ENCOUNTER — Ambulatory Visit (HOSPITAL_COMMUNITY)
Admission: RE | Admit: 2021-03-17 | Discharge: 2021-03-17 | Disposition: A | Discharge status: Home / Self Care | Payer: Self-pay | Source: Ambulatory Visit | Attending: Internal Medicine | Admitting: Internal Medicine

## 2021-03-17 ENCOUNTER — Other Ambulatory Visit (HOSPITAL_COMMUNITY): Payer: Self-pay | Admitting: Internal Medicine

## 2021-03-17 ENCOUNTER — Encounter (HOSPITAL_COMMUNITY): Payer: Self-pay

## 2021-03-17 ENCOUNTER — Ambulatory Visit (HOSPITAL_COMMUNITY)
Admission: RE | Admit: 2021-03-17 | Discharge: 2021-03-17 | Disposition: A | Discharge status: Home / Self Care | Payer: Self-pay | Source: Ambulatory Visit | Attending: Cardiology | Admitting: Cardiology

## 2021-03-17 VITALS — BP 148/110 | HR 57 | Wt 227.8 lb

## 2021-03-17 DIAGNOSIS — I130 Hypertensive heart and chronic kidney disease with heart failure and stage 1 through stage 4 chronic kidney disease, or unspecified chronic kidney disease: Secondary | ICD-10-CM | POA: Insufficient documentation

## 2021-03-17 DIAGNOSIS — Z794 Long term (current) use of insulin: Secondary | ICD-10-CM | POA: Insufficient documentation

## 2021-03-17 DIAGNOSIS — Z8616 Personal history of COVID-19: Secondary | ICD-10-CM | POA: Insufficient documentation

## 2021-03-17 DIAGNOSIS — Z79899 Other long term (current) drug therapy: Secondary | ICD-10-CM | POA: Insufficient documentation

## 2021-03-17 DIAGNOSIS — I493 Ventricular premature depolarization: Secondary | ICD-10-CM

## 2021-03-17 DIAGNOSIS — Z90710 Acquired absence of both cervix and uterus: Secondary | ICD-10-CM | POA: Insufficient documentation

## 2021-03-17 DIAGNOSIS — I313 Pericardial effusion (noninflammatory): Secondary | ICD-10-CM | POA: Insufficient documentation

## 2021-03-17 DIAGNOSIS — Z7982 Long term (current) use of aspirin: Secondary | ICD-10-CM | POA: Insufficient documentation

## 2021-03-17 DIAGNOSIS — Z560 Unemployment, unspecified: Secondary | ICD-10-CM | POA: Insufficient documentation

## 2021-03-17 DIAGNOSIS — N184 Chronic kidney disease, stage 4 (severe): Secondary | ICD-10-CM | POA: Insufficient documentation

## 2021-03-17 DIAGNOSIS — I480 Paroxysmal atrial fibrillation: Secondary | ICD-10-CM | POA: Insufficient documentation

## 2021-03-17 DIAGNOSIS — Z7901 Long term (current) use of anticoagulants: Secondary | ICD-10-CM | POA: Insufficient documentation

## 2021-03-17 DIAGNOSIS — Z8673 Personal history of transient ischemic attack (TIA), and cerebral infarction without residual deficits: Secondary | ICD-10-CM | POA: Insufficient documentation

## 2021-03-17 DIAGNOSIS — Z8249 Family history of ischemic heart disease and other diseases of the circulatory system: Secondary | ICD-10-CM | POA: Insufficient documentation

## 2021-03-17 DIAGNOSIS — I5022 Chronic systolic (congestive) heart failure: Secondary | ICD-10-CM

## 2021-03-17 DIAGNOSIS — E1122 Type 2 diabetes mellitus with diabetic chronic kidney disease: Secondary | ICD-10-CM | POA: Insufficient documentation

## 2021-03-17 LAB — BASIC METABOLIC PANEL
Anion gap: 8 (ref 5–15)
BUN: 16 mg/dL (ref 6–20)
CO2: 27 mmol/L (ref 22–32)
Calcium: 9.6 mg/dL (ref 8.9–10.3)
Chloride: 107 mmol/L (ref 98–111)
Creatinine, Ser: 1.51 mg/dL — ABNORMAL HIGH (ref 0.44–1.00)
GFR, Estimated: 41 mL/min — ABNORMAL LOW (ref >60)
Glucose, Bld: 145 mg/dL — ABNORMAL HIGH (ref 70–99)
Potassium: 3.6 mmol/L (ref 3.5–5.1)
Sodium: 142 mmol/L (ref 135–145)

## 2021-03-17 MED FILL — ?CARVEDILOL 6.25 MG TABLET: 6.25 | 30 days supply | Qty: 60 | Fill #1

## 2021-03-17 MED FILL — AMLODIPINE BESYLATE 10 MG T: 10 | 30 days supply | Qty: 30 | Fill #1

## 2021-03-17 MED FILL — ISOSORBIDE MN ER 60 MG TAB: 60 | 30 days supply | Qty: 30 | Fill #3

## 2021-03-17 NOTE — Progress Notes (Signed)
PCP: Dr Wynetta Emery  Primary Cardiologist: Dr Gardiner Rhyme HF MD: Dr [name] Nephorology: Dr Joelyn Oms   HPI: Adrienne Lane is a 55 year old with a history of morbid obesity, HTN, DM, PAF, PVCs, and chronic systolic HF.   Admitted to [facility] 10/20/19 after a mechanical fall and landed on her hip. ECHO showed EF 20-25% with severe RV dysfunction as well. 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 Started on amio/ heparin drip and IV lasix. Renal function worsened. Abdominal Xray showed large staghorn calculi. CT Renal Stone showed bilateral staghorn calculi without hydronephrosis and severe anasarca. Urology consulted on 11/3 with mention of possible nephrostomy tubes but renal US was negative for hydronephrosis. GYN consulted for uterine mass. Mass was thought to be large fibroid that will need to follow up after discharge. Nephrology consulted for worsening renal failure. Transferred to [facility] for CVVHD. Overall diuresed 90 pounds. Later transitioned to torsemide 40 mg daily. Placed on carvedilol and mexiletine due to high PVC burden. Discharged on 11/12/19 with weight 255 pounds.   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 Saw Dr. Lovena Le who was considering PVC ablation. Doubted mexilitene is helping much.  Admitted 6/21 with AKI on CKD. CT scan of the abdomen which showed bilateral hydronephrosis and large fibroid uterus. Creatinine on presentation was 3.24. Bilateral ureteral stent placement with bilateral retrograde pyelography. Creatinine improved.   07/2020 underwent total abdominal hysterectomy and bilateral salpingoopherectomy  12/2020 she was admitted for acute CVA. Presented w/ facial droop and blurry vision. She presented outside of window for TPA, it was not given. Seen by neurology/stroke team.  Completed work-up for CVA. MRI brain showed acute infarct in the right hemipons, additional small acute infarct in the left frontal subcortical white  matter. Mild associated edema in the right pons without substantial mass-effect. Small remote lacunar infarct in the right corona radiata and left periatrial white matter. She had no significant carotid artery stenosis. 2D echo showed mildly reduced LVEF 45 to 50%, left ventricle global hypokinesis. No PFO. No intracardiac source of thrombus. Hemoglobin A1c 8.0, and LDL 173, not at goal. Aspirin 81 mg was added and Eliquis was switched to Pradaxa. She was discharged to CIR. While in CIR she developed upper respiratory symptoms and tested + for COVID and was readmitted to [facility] under [place]. Did not require intubation. Chest x-ray was stable, she received 3 doses of steroids and Remdesivir and recovered. Discharged home on 1/18.  Had [facility] f/u on 01/22/21 and was doing well from CHF standpoint w/ NYHA Class II symptoms and stable volume status. From a neuro standpoint, she continued w/ mild left sided UE weakness and was enrolled in physical therapy.   She was seen back in the ED 3/22 w/  blurred vision x 4 days. Both head CT and MRI were negative for acute findings. Labs were stable. Was discharged from ED and referred to ophthalmology.   Presents back now for f/u for heart failure. Doing ok from CHF standpoint, but some limitation w/ ADLs, NYHA Class II-early III. BP is elevated today 148/100 but reports that she ran out of amlodipine, Coreg and Imdur 3 days ago. She contacted her pharmacy and has refills ready for pickup today. Wt is stable since last visit. Denies CP. Denies palpitations but never feels her PVCs. EKG today shows NSR w/ frequent PVCs.    ________________________  Echo 1/22: LVEF 45-50%. RV mildly reduced Echo 2/21 EF 40-45% hard to assess with PVCs. (read formally as  50-55%) RV mildly down No effusion.  ECHO 10/21/2019. EF 20-25%, pericardial effusion, mild LVH, RA/LA dilated. RV down.   [place] 11/12/19  RA = 11 RV = 47/15 PA = 49/17 (32) PCW = 20 Fick cardiac output/index =  8.8/3.8 PVR = 1.4 WU FA sat = 97% PA sat = 70%, 72% High SVC = 68%  ROS: All systems negative except as listed in HPI, PMH and Problem List.  SH:  Social History   Socioeconomic History  . Marital status: Married    Spouse name: Not on file  . Number of children: 0  . Years of education: Not on file  . Highest education level: Associate degree: occupational, Hotel manager, or vocational program  Occupational History  . Occupation: unemployed  Tobacco Use  . Smoking status: Never Smoker  . Smokeless tobacco: Never Used  Vaping Use  . Vaping Use: Never used  Substance and Sexual Activity  . Alcohol use: Never  . Drug use: Never  . Sexual activity: Yes    Birth control/protection: None  Other Topics Concern  . Not on file  Social History Narrative  . Not on file   Social Determinants of Health   Financial Resource Strain: Not on file  Food Insecurity: Not on file  Transportation Needs: No Transportation Needs  . Lack of Transportation (Medical): No  . Lack of Transportation (Non-Medical): No  Physical Activity: Not on file  Stress: Not on file  Social Connections: Not on file  Intimate Partner Violence: Not on file    FH:  Family History  Problem Relation Age of Onset  . Atrial fibrillation Mother   . Hypertension Mother   . Stroke Mother 4  . Diabetes Mother   . Diabetes Father   . Hypertension Father   . Diabetes Sister   . Hypertension Sister   . Diabetes Brother   . Hypertension Brother   . Diabetes Brother   . Heart attack Brother   . Stroke Maternal Grandmother 45    Past Medical History:  Diagnosis Date  . Atrial fibrillation with RVR ([place])   . Bigeminy 01/2020  . CHF (congestive heart failure) ([place]) 10/20/2019  . CKD (chronic kidney disease), stage IV ([place])   . Diabetes mellitus without complication ([place])   . DOE (dyspnea on exertion)    walking upstairs or up hill resolves in one minute  . Fibroids   . History of kidney stones   . History  of recent blood transfusion 02/26/2020  . Hypertension   . Iron deficiency anemia   . Non-ischemic cardiomyopathy (HCC)    tachycardia induced  . Obese   . Peripheral edema   . Premature ventricular contractions (PVCs) (VPCs)   . Umbilical hernia   . Wears glasses     Current Outpatient Medications  Medication Sig Dispense Refill  . amLODipine (NORVASC) 10 MG tablet Take 1 tablet (10 mg total) by mouth daily. 30 tablet 6  . aspirin 81 MG chewable tablet Chew 1 tablet (81 mg total) by mouth daily. 360 tablet 0  . atorvastatin (LIPITOR) 80 MG tablet Take 1 tablet (80 mg total) by mouth every evening. 90 tablet 0  . carvedilol (COREG) 6.25 MG tablet Take 1 tablet (6.25 mg total) by mouth 2 (two) times daily with a meal. 60 tablet 6  . cholecalciferol (VITAMIN D3) 25 MCG (1000 UNIT) tablet Take 1,000 Units by mouth daily.    . dabigatran (PRADAXA) 150 MG CAPS capsule Take 1 capsule (150 mg total)  by mouth every 12 (twelve) hours. 60 capsule 3  . dapagliflozin propanediol (FARXIGA) 10 MG TABS tablet Take 1 tablet (10 mg total) by mouth daily before breakfast. 30 tablet 6  . glucose blood test strip Use as instructed 100 each 12  . hydrALAZINE (APRESOLINE) 100 MG tablet Take 1 tablet (100 mg total) by mouth 3 (three) times daily. 90 tablet 11  . insulin aspart (NOVOLOG) 100 UNIT/ML injection Substitute to any brand approved.Before each meal 3 times a day, 140-199 - 2 units, 200-250 - 4 units, 251-299 - 6 units,  300-349 - 8 units,  350 or above 10 units. Dispense syringes and needles as needed, Ok to switch to PEN if approved. DX DM2, Code E11.65 10 mL 0  . isosorbide mononitrate (IMDUR) 60 MG 24 hr tablet Take 60 mg by mouth daily.    . Lancets (ONETOUCH ULTRASOFT) lancets Check CBG twice a day 60 each 5  . LANTUS 100 UNIT/ML injection INJECT 0.2 MLS (20 UNITS TOTAL) INTO THE SKIN EVERY MORNING. (Patient taking differently: Inject 20 Units into the skin daily.) 10 mL 0  . mexiletine (MEXITIL)  150 MG capsule TAKE 2 CAPSULES (300 MG TOTAL) BY MOUTH EVERY 12 (TWELVE) HOURS. 120 capsule 6  . Multiple Vitamins-Minerals (WOMENS MULTI PO) Take 1 tablet by mouth daily.    . potassium chloride (KLOR-CON) 10 MEQ tablet Take 1 tablet (10 mEq total) by mouth daily. 30 tablet 6  . TRUEPLUS INSULIN SYRINGE 30G X 5/16" 0.5 ML MISC USE TO INJECT 12 UNITS AT BEDTIME. 100 each 6   No current facility-administered medications for this encounter.    Vitals:   03/17/21 0949  BP: (!) 148/110  Pulse: (!) 57  SpO2: 97%  Weight: 103.3 kg   Wt Readings from Last 3 Encounters:  03/17/21 103.3 kg  03/03/21 106.1 kg  03/02/21 105.2 kg    PHYSICAL EXAM: General:  Well appearing, moderately obese. No respiratory difficulty HEENT: normal Neck: supple. no JVD. Carotids 2+ no bruits. No lymphadenopathy or thyromegaly appreciated. Cor: PMI nondisplaced. Irregular rhythm and rate. No rubs, gallops or murmurs. Lungs: clear Abdomen: soft, nontender, nondistended. No hepatosplenomegaly. No bruits or masses. Good bowel sounds. Extremities: no cyanosis, clubbing, rash, edema Neuro: alert & oriented x 3, cranial nerves grossly intact. moves all 4 extremities w/o difficulty. Affect pleasant.   EKG: SR w/ frequent PVCs Personally reviewed   ASSESSMENT & PLAN: 1. Chronic  Systolic Heart Failure ECHO 10/2019  EF 20-25%, pericardial effusion, mild LVH, RA/LA dilated.  RV . Possible Tachy Mediated cardiomyopathy verus PVC. She has not had LHC due to elevated creatinine. Had [place] 11/23 with preserved cardiac output.  - Echo 2/21 EF 40-45% (Hard to assess with PVCs) RV mild HK. No effusion Personally reviewed - Echo 1/22: EF 45-50%. RV mildly reduced  - NYHA Class II-early III, Euvolemic on exam but BP elevated after running out of meds  - Resume carvedilol 6.25 mg bid - Has not been on spiro/dig/arb with CKD IIB-IV. Recent creatinine 1.9  - C/w Farxiga 10 mg daily  - Continue hydralazine 100 mg three times a  day + imdur 60 mg daily.  - Continue amlodipine 10 mg daily - Check BMP today    2. Frequent PVCs  - Failed amio - On mexilitene 300 mg bid - Zio Patch 11/2019 Frequent PVCs (19.0%, 42278) with two predominant morphologies (9.8 & 9.3%, respectively).  - Per Dr. Lovena Le, needs repeat Zio monitor to reassess burden on mexiletine. Will  arrange 72 hr monitor. If burden 12% or less, doubt ablation would be of much value  - check BMP and Mg level today     3. PAF  - Maintaining NSR w/ frequent PVCs on EKG  - Continue carvedilol 6.25 mg bid  - Had CVA w/ Eliquis, now on Pradaxa   4. CKD 3b - recent creatinine baseline SCr ~1.9 - Follows with Nephrology - Check BMP   5. HTN  - elevated today after running out of meds 3 days ago, 148/100 - plans to pick up refills today at pharmacy - resume Coreg, amlodipine, hydarlazine and Imdur   7. Type 2 DM - Continue Farxiga  8. Uterine Fibroids  -s/p total abdominal hysterectomy and bilateral salpingoopherectomy  9. Possible OSA with snoring - needs sleep study - complicated by lack of health insurance - can order when she gets Medicaid (currenlty pending coverage)   10. CVA - recent CVA 1/21 in setting of Afib - Failed Eliquis, now on Pradaxa   11. COVID 19 infection  - 1/22, recovered treated w/ steroids and Remdesivir  Keep f/u w/ Dr. [name] in 4 weeks    Ariah Mower PA-C   9:59 AM

## 2021-03-17 NOTE — Patient Instructions (Signed)
It was great to see you today! No medication changes are needed at this time.  Labs today We will only contact you if something comes back abnormal or we need to make some changes. Otherwise no news is good news!  Keep followup as scheduled  Your provider has recommended that  you wear a Zio Patch for 3 days.  This monitor will record your heart rhythm for our review.  IF you have any symptoms while wearing the monitor please press the button.  If you have any issues with the patch or you notice a red or orange light on it please call the company at [phone].  Once you remove the patch please mail it back to the company as soon as possible so we can get the results.  Do the following things EVERYDAY: 1) Weigh yourself in the morning before breakfast. Write it down and keep it in a log. 2) Take your medicines as prescribed 3) Eat low salt foods--Limit salt (sodium) to 2000 mg per day.  4) Stay as active as you can everyday 5) Limit all fluids for the day to less than 2 liters  At the [facility], you and your health needs are our priority. As part of our continuing mission to provide you with exceptional heart care, we have created designated Provider Care Teams. These Care Teams include your primary Cardiologist (physician) and Advanced Practice Providers (APPs- Physician Assistants and Nurse Practitioners) who all work together to provide you with the care you need, when you need it.   You may see any of the following providers on your designated Care Team at your next follow up: Marland Kitchen Dr [name] . Dr Loralie Champagne . Dr Vickki Muff . Darrick Grinder, NP . Lyda Jester, [place] . Audry Riles, PharmD   Please be sure to bring in all your medications bottles to every appointment.   If you have any questions or concerns before your next appointment please send [place] a message through [place] or call our office at [phone].    TO LEAVE A  MESSAGE FOR THE NURSE SELECT OPTION 2, PLEASE LEAVE A MESSAGE INCLUDING: . YOUR NAME . DATE OF BIRTH . CALL BACK NUMBER . REASON FOR CALL**this is important as we prioritize the call backs  YOU WILL RECEIVE A CALL BACK THE SAME DAY AS LONG AS YOU CALL BEFORE 4:00 PM  Please see our updated No Show and Same Day Appointment Cancellation Policy attached to your AVS.

## 2021-03-18 ENCOUNTER — Encounter: Payer: Self-pay | Admitting: Occupational Therapy

## 2021-03-18 ENCOUNTER — Ambulatory Visit: Payer: Self-pay | Admitting: Occupational Therapy

## 2021-03-18 ENCOUNTER — Ambulatory Visit: Payer: Self-pay

## 2021-03-18 DIAGNOSIS — R278 Other lack of coordination: Secondary | ICD-10-CM

## 2021-03-18 DIAGNOSIS — R2689 Other abnormalities of gait and mobility: Secondary | ICD-10-CM

## 2021-03-18 DIAGNOSIS — R2681 Unsteadiness on feet: Secondary | ICD-10-CM

## 2021-03-18 DIAGNOSIS — R41842 Visuospatial deficit: Secondary | ICD-10-CM

## 2021-03-18 DIAGNOSIS — M25512 Pain in left shoulder: Secondary | ICD-10-CM

## 2021-03-18 DIAGNOSIS — M6281 Muscle weakness (generalized): Secondary | ICD-10-CM

## 2021-03-18 DIAGNOSIS — M25612 Stiffness of left shoulder, not elsewhere classified: Secondary | ICD-10-CM

## 2021-03-18 NOTE — Therapy (Signed)
[facility] [address] [place], [place], [address] Phone: [phone]   Fax:  [phone]  Occupational Therapy Treatment  Patient Details  Name: Adrienne Lane MRN: [id number] Date of Birth: March 10, 1966 Referring Provider (OT): Lala Lund, MD   Encounter Date: 03/18/2021   OT End of Session - 03/18/21 1148    Visit Number 5    Number of Visits 9    Date for OT Re-Evaluation 03/30/21    Authorization Type Medicaid Pending    OT Start Time 1147    OT Stop Time 1226    OT Time Calculation (min) 39 min    Activity Tolerance Patient tolerated treatment well    Behavior During Therapy [facility] for tasks assessed/performed           Past Medical History:  Diagnosis Date  . Atrial fibrillation with RVR ([place])   . Bigeminy 01/2020  . CHF (congestive heart failure) ([place]) 10/20/2019  . CKD (chronic kidney disease), stage IV ([place])   . Diabetes mellitus without complication ([place])   . DOE (dyspnea on exertion)    walking upstairs or up hill resolves in one minute  . Fibroids   . History of kidney stones   . History of recent blood transfusion 02/26/2020  . Hypertension   . Iron deficiency anemia   . Non-ischemic cardiomyopathy (HCC)    tachycardia induced  . Obese   . Peripheral edema   . Premature ventricular contractions (PVCs) (VPCs)   . Umbilical hernia   . Wears glasses     Past Surgical History:  Procedure Laterality Date  . CHOLECYSTECTOMY    . CYSTOSCOPY W/ URETERAL STENT PLACEMENT Bilateral 05/24/2020   Procedure: CYSTOSCOPY WITH RETROGRADE PYELOGRAM/URETERAL STENT PLACEMENT;  Surgeon: Robley Fries, MD;  Location: [facility];  Service: Urology;  Laterality: Bilateral;  . CYSTOSCOPY/RETROGRADE/URETEROSCOPY Bilateral 04/03/2020   Procedure: CYSTOSCOPY/RETROGRADE/URETEROSCOPY;  Surgeon: Ardis Hughs, MD;  Location: [facility];  Service: Urology;  Laterality: Bilateral;  . HYSTERECTOMY ABDOMINAL WITH  SALPINGO-OOPHORECTOMY  07/21/2020   Procedure: HYSTERECTOMY ABDOMINAL WITH BILATERAL SALPINGO-OOPHORECTOMY;  Surgeon: Sanjuana Kava, MD;  Location: [place];  Service: Gynecology;;  . IR FLUORO GUIDE CV LINE RIGHT  02/21/2020  . IR US GUIDE VASC ACCESS RIGHT  02/21/2020  . NEPHROLITHOTOMY Left 02/14/2020   Procedure: NEPHROLITHOTOMY PERCUTANEOUS/ SURGEON ACCESS/ LEFT PERCUTANEOUS NEPHROSTOMY TUBE PLACEMENT;  Surgeon: Ardis Hughs, MD;  Location: [facility];  Service: Urology;  Laterality: Left;  . NEPHROLITHOTOMY Left 02/21/2020   Procedure: NEPHROLITHOTOMY PERCUTANEOUS SECOND LOOK;  Surgeon: Ardis Hughs, MD;  Location: [facility];  Service: Urology;  Laterality: Left;  . NEPHROLITHOTOMY Left 02/26/2020   Procedure: NEPHROLITHOTOMY PERCUTANEOUS;  Surgeon: Ceasar Mons, MD;  Location: [facility];  Service: Urology;  Laterality: Left;  NEED 150 MIN  . NEPHROLITHOTOMY Right 03/24/2020   Procedure: NEPHROLITHOTOMY PERCUTANEOUS WITH ACCESS LEFT STENT REMOVAL;  Surgeon: Ardis Hughs, MD;  Location: [facility];  Service: Urology;  Laterality: Right;  . RIGHT HEART CATH N/A 11/09/2019   Procedure: RIGHT HEART CATH;  Surgeon: Jolaine Artist, MD;  Location: [facility];  Service: Cardiovascular;  Laterality: N/A;  . WISDOM TOOTH EXTRACTION      There were no vitals filed for this visit.   Subjective Assessment - 03/18/21 1200    Subjective  Pt reports LUE hand and wrist are painful when trying to make a fist.    Pertinent History [place] CVA, DM, HTN, CKD, CHF  Limitations fall risk. not driving.    Patient Stated Goals get stronger and get back to normal    Currently in Pain? Yes    Pain Score 4     Pain Location Wrist    Pain Orientation Left    Pain Descriptors / Indicators Shooting    Pain Type Acute pain    Pain Onset In the past 7 days    Pain Frequency Occasional    Pain Relieving Factors heat           TREATMENT:  Fluidotherapy: 12 minute with LUE for wrist and  hand with no adverse reactions. Pain and stiffness.   Grooved Pegs: with LUE for increase in coordination. Pt with min difficulty with rotating pegs in LUE.    Hand Gripper: LUE level 1 with black spring picking up 1 inch blocks. Pt did white spring last session mostly on level 1 - upgraded task - pt tolerated 3 blocks and had to downgrade to white spring level 1.                    OT Short Term Goals - 03/11/21 1157      OT SHORT TERM GOAL #1   Title Pt will be independent with coordination HEP for LUE 03/02/2021    Baseline not issued yet    Time 4    Period Weeks    Status On-going    Target Date 03/02/21      OT SHORT TERM GOAL #2   Title Pt will demonstrate improved LUE functional use by improving Box and Blocks score to 42 or greater.    Baseline RUE 52 LUE 36    Time 4    Period Weeks    Status On-going   LUE 39 blocks 03/11/2021     OT SHORT TERM GOAL #3   Title Pt will improve grip strength in LUE to 18 lbs or greater for increase for clothing management and overall functional use.    Baseline RUE 44.7 LUE 13.8    Time 4    Period Weeks    Status On-going   13.8 lbs with LUE 03/11/2021     OT SHORT TERM GOAL #4   Title Pt will report increased speed and accuracy with basic ADLs (i.e. tying shoes, washing up, etc)    Time 4    Period Weeks    Status On-going   pt reports "its a little better" 02/23/21            OT Long Term Goals - 03/11/21 1200      OT LONG TERM GOAL #1   Title Pt will be independent with updated HEP for LUE strengthening and coordination    Baseline not issued yet    Time 8    Period Weeks    Status New      OT LONG TERM GOAL #2   Title Pt will improve LUE fine motor coordination by completing 9 hole peg test with LUE in 50 seconds or less *REVISED: 30 seconds or less with LUE    Baseline LUE 68.37, RUE 24.5    Time 8    Period Weeks    Status Revised   03/11/2021 LUE 38.63s     OT LONG TERM GOAL #3   Title Pt will  improve grip strength in LUE to 25 lbs or greater for increasing overall functional use of LUE.    Baseline LUE 13.8 RUE 44.7  Time 8    Period Weeks    Status New      OT LONG TERM GOAL #4   Title Pt will improve standing tolerance to 20 minutes or greater in order to increase ability to complete home management tasks with improved activity tolerance    Baseline 10 minutes before requiring break    Time 8    Period Weeks    Status New                 Plan - 03/18/21 1219    Clinical Impression Statement Pt continues to have decreased grip strength in LUE. Pt benefits from fluidotherapy/heat for managing stifness and pain in LUE    OT Occupational Profile and History Problem Focused Assessment - Including review of records relating to presenting problem    Occupational performance deficits (Please refer to evaluation for details): ADL's;IADL's;Leisure    Body Structure / Function / Physical Skills ADL;IADL;Coordination;FMC;ROM;UE functional use;GMC;Vision;Strength;Dexterity;Decreased knowledge of use of DME;Balance;Pain    Rehab Potential Good    Clinical Decision Making Limited treatment options, no task modification necessary    Comorbidities Affecting Occupational Performance: None    Modification or Assistance to Complete Evaluation  No modification of tasks or assist necessary to complete eval    OT Frequency 1x / week    OT Duration 8 weeks   8 visits + eval   OT Treatment/Interventions Self-care/ADL training;Cryotherapy;Fluidtherapy;DME and/or AE instruction;Passive range of motion;Manual Therapy;Functional Mobility Training;Neuromuscular education;Moist Heat;Therapeutic exercise;Patient/family education;Therapeutic activities;Visual/perceptual remediation/compensation;Energy conservation;Paraffin    Plan continue LUE grip strength and coordination. fluido LUE for stiffness    Consulted and Agree with Plan of Care Patient           Patient will benefit from skilled  therapeutic intervention in order to improve the following deficits and impairments:   Body Structure / Function / Physical Skills: ADL,IADL,Coordination,FMC,ROM,UE functional use,GMC,Vision,Strength,Dexterity,Decreased knowledge of use of DME,Balance,Pain       Visit Diagnosis: Muscle weakness (generalized)  Other abnormalities of gait and mobility  Stiffness of left shoulder, not elsewhere classified  Other lack of coordination  Visuospatial deficit  Acute pain of left shoulder  Unsteadiness on feet    Problem List Patient Active Problem List   Diagnosis Date Noted  . Febrile illness, acute 01/03/2021  . Slow transit constipation   . Sore throat   . Irregular cardiac rhythm   . PAF (paroxysmal atrial fibrillation) ([place])   . Labile blood pressure   . Hypokalemia   . Labile blood glucose   . Poorly controlled type 2 diabetes mellitus with peripheral neuropathy ([place])   . Stage 3b chronic kidney disease ([place])   . Right pontine cerebrovascular accident ([place]) 12/27/2020  . Acute CVA (cerebrovascular accident) ([place]) 12/24/2020  . Hyperlipidemia 10/24/2020  . Vitamin D deficiency 10/24/2020  . Fibroids 07/21/2020  . S/P TAH (total abdominal hysterectomy) 07/21/2020  . Chronic a-fib ([place]) 05/23/2020  . Hydroureter on right 05/23/2020  . Microalbuminuria 05/13/2020  . Non-ischemic cardiomyopathy ([place])   . Normocytic anemia   . Nephrolithiasis 02/14/2020  . Paroxysmal atrial fibrillation ([place]) 12/18/2019  . 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 Gross hematuria 12/18/2019  . Staghorn renal calculus 12/18/2019  . Umbilical hernia without obstruction and without gangrene 12/18/2019  . Uterine fibroid 10/25/2019  . Right hip pain   . Type 2 diabetes mellitus ([place])   . Essential hypertension   . CHF (congestive heart failure) ([place]) 10/21/2019  . Atrial fibrillation with RVR ([place]) 10/21/2019  .  Atrial fibrillation with rapid ventricular response ([place]) 10/20/2019    Zachery Conch MOT, OTR/L  03/18/2021, 1:22 PM  [facility] [address] [place], [place], [address] Phone: [phone]   Fax:  [phone]  Name: Adrienne Lane MRN: [id number] Date of Birth: 1966/03/21

## 2021-03-18 NOTE — Therapy (Signed)
[facility] [address] [place], [place], [address] Phone: [phone]   Fax:  [phone]  Physical Therapy Treatment  Patient Details  Name: Adrienne Lane MRN: [id number] Date of Birth: [birth year] Referring Provider (PT): Referred by: Thurnell Lose, MD. PCP is Nicolette Bang, DO   Encounter Date: 03/18/2021   PT End of Session - 03/18/21 1620    Visit Number 5    Number of Visits 9    Authorization Type Medicaid (Pending)    PT Start Time 1100    PT Stop Time 1145    PT Time Calculation (min) 45 min    Equipment Utilized During Treatment Gait belt    Activity Tolerance Patient tolerated treatment well    Behavior During Therapy [employer] for tasks assessed/performed           Past Medical History:  Diagnosis Date  . Atrial fibrillation with RVR ([place])   . Bigeminy 01/2020  . CHF (congestive heart failure) ([place]) 10/20/2019  . CKD (chronic kidney disease), stage IV ([place])   . Diabetes mellitus without complication ([place])   . DOE (dyspnea on exertion)    walking upstairs or up hill resolves in one minute  . Fibroids   . History of kidney stones   . History of recent blood transfusion 02/26/2020  . Hypertension   . Iron deficiency anemia   . Non-ischemic cardiomyopathy (HCC)    tachycardia induced  . Obese   . Peripheral edema   . Premature ventricular contractions (PVCs) (VPCs)   . Umbilical hernia   . Wears glasses     Past Surgical History:  Procedure Laterality Date  . CHOLECYSTECTOMY    . CYSTOSCOPY W/ URETERAL STENT PLACEMENT Bilateral 05/24/2020   Procedure: CYSTOSCOPY WITH RETROGRADE PYELOGRAM/URETERAL STENT PLACEMENT;  Surgeon: Robley Fries, MD;  [facility];  Service: Urology;  Laterality: Bilateral;  . CYSTOSCOPY/RETROGRADE/URETEROSCOPY Bilateral 04/03/2020   Procedure: CYSTOSCOPY/RETROGRADE/URETEROSCOPY;  Surgeon: Ardis Hughs, MD;  [facility];  Service: Urology;   Laterality: Bilateral;  . HYSTERECTOMY ABDOMINAL WITH SALPINGO-OOPHORECTOMY  07/21/2020   Procedure: HYSTERECTOMY ABDOMINAL WITH BILATERAL SALPINGO-OOPHORECTOMY;  Surgeon: Sanjuana Kava, MD;  Location: [facility];  Service: Gynecology;;  . IR FLUORO GUIDE CV LINE RIGHT  02/21/2020  . IR US GUIDE VASC ACCESS RIGHT  02/21/2020  . NEPHROLITHOTOMY Left 02/14/2020   Procedure: NEPHROLITHOTOMY PERCUTANEOUS/ SURGEON ACCESS/ LEFT PERCUTANEOUS NEPHROSTOMY TUBE PLACEMENT;  Surgeon: Ardis Hughs, MD;  [facility];  Service: Urology;  Laterality: Left;  . NEPHROLITHOTOMY Left 02/21/2020   Procedure: NEPHROLITHOTOMY PERCUTANEOUS SECOND LOOK;  Surgeon: Ardis Hughs, MD;  [facility];  Service: Urology;  Laterality: Left;  . NEPHROLITHOTOMY Left 02/26/2020   Procedure: NEPHROLITHOTOMY PERCUTANEOUS;  Surgeon: Ceasar Mons, MD;  [facility];  Service: Urology;  Laterality: Left;  NEED 150 MIN  . NEPHROLITHOTOMY Right 03/24/2020   Procedure: NEPHROLITHOTOMY PERCUTANEOUS WITH ACCESS LEFT STENT REMOVAL;  Surgeon: Ardis Hughs, MD;  [facility];  Service: Urology;  Laterality: Right;  . RIGHT HEART CATH N/A 11/09/2019   Procedure: RIGHT HEART CATH;  Surgeon: Jolaine Artist, MD;  Location: [facility];  Service: Cardiovascular;  Laterality: N/A;  . WISDOM TOOTH EXTRACTION      There were no vitals filed for this visit.   Subjective Assessment - 03/18/21 1108    Subjective reports some R hip soreness as she feels she is compensating, denies falls or med changes  Pertinent History CVA, Diabetes, CKD Stage 3, A. fib, CHF, HTN, , Normocytic anemia, Hyperlipidemia, COVID - 19    Limitations Standing;Walking;House hold activities    How long can you walk comfortably? 10 minutes    Patient Stated Goals get strength back in LLE    Pain Onset In the past 7 days                             [facility] Adult PT Treatment/Exercise - 03/18/21 0001       Transfers   Transfers Sit to Stand    Sit to Stand 5: Supervision    Comments 2x15 from airex w/UE support      Ambulation/Gait   Ambulation/Gait Yes    Ambulation/Gait Assistance 5: Supervision;4: Min guard    Ambulation/Gait Assistance Details stepping over orange stripe    Ambulation Distance (Feet) 200 Feet    Assistive device Straight cane    Gait Pattern Step-through pattern    Ambulation Surface Level;Indoor      Knee/Hip Exercises: Aerobic   Nustep L3 arms 10 8'      Knee/Hip Exercises: Standing   Forward Step Up Both;2 sets;10 reps;Hand Hold: 1;Step Height: 4"    Forward Step Up Limitations runner step from 4" step LLE to target at counter height      Knee/Hip Exercises: Seated   Marching Strengthening;Left;2 sets;10 reps;Limitations    Marching Limitations performed seated placing L foot on 2" step 2x10 ea. direction               Balance Exercises - 03/18/21 0001      Balance Exercises: Standing   Stepping Strategy Anterior;Lateral;UE support;10 reps;Limitations    Stepping Strategy Limitations performed at counter, step taps and step ups to 4" block 10x each task               PT Short Term Goals - 03/12/21 2152      PT SHORT TERM GOAL #1   Title Patient will be independent with initial HEP for balance/strengthening (STGs Due at 5th Visit)    Baseline 03/11/21: met with current HEP, advanced HEP this session    Status Achieved      PT SHORT TERM GOAL #2   Title Patient will improve 5x sit <> stand to </= 20 seconds to demonstrate improved balance    Baseline 03/11/21: 19.69 sec's with UE support from standard height chair    Status Achieved      PT SHORT TERM GOAL #3   Title Patient will imrpove Berg Balance to >/= 46/56 to demonstrate reduced fall risk and improved balance    Baseline 03/11/21: 48/56 scored this session    Status Achieved      PT SHORT TERM GOAL #4   Title Patient will demo ability to ambulate >/= 500 ft with supervision  without AD to demonstrate improved household mobility    Baseline 03/11/21: met distance portion of goal with supervision to min guard assist no AD    Status Partially Met             PT Long Term Goals - 02/02/21 1453      PT LONG TERM GOAL #1   Title Patient will be independent with Final HEP for strengthening/balance (ALL LTGs Due at 9th Visit)    Baseline no HEP established    Time 8    Period --   visits   Status New  PT LONG TERM GOAL #2   Title Patient will improve TUG to </= 10 seconds to demo improved balance and reduced fall risk    Baseline 12.25 secs    Time 8    Period --   visits   Status New      PT LONG TERM GOAL #3   Title Patient will improve Berg Balance to >/= 50/56 to demonstrate decreased fall risk and improved balance    Baseline 43/56    Time 8    Period --   visits   Status New      PT LONG TERM GOAL #4   Title Patient will improve gait speed to >/= 2.75 ft/sec to demonstrate improved community ambulation    Baseline 2.42 ft/sec    Time 8    Period --   visits   Status New      PT LONG TERM GOAL #5   Title Patient will improve 5x sit <> stand to </= 15 seconds to demonstrate reduce fall risk and improved balance    Baseline 24.72 secs    Time 8    Period --   visits   Status New      Additional Long Term Goals   Additional Long Term Goals Yes      PT LONG TERM GOAL #6   Title Patient will demo abilit yto ambulate >/= 500 ft on various outdoor surfaces for improved community mobility    Baseline TBA    Time 8    Period --   visits   Status New                 Plan - 03/18/21 1620    Clinical Impression Statement Todays skilled session focused on LLE strength and functional tasks including ambulation and stepping, continued deficits noted in LLE prominent in hip flexion, added stepping tasks in seated and standing positions with patient completing all requested tasks w/o discomfort or incident    Personal Factors and  Comorbidities Comorbidity 3+    Comorbidities CVA, Diabetes, CKD Stage 3, A. fib, CHF, HTN, , Normocytic anemia, Hyperlipidemia, COVID - 19    Examination-Activity Limitations Bed Mobility;Transfers;Stand;Stairs    Examination-Participation Restrictions Church;Community Activity;Laundry;Cleaning    Stability/Clinical Decision Making Evolving/Moderate complexity    Rehab Potential Good    PT Frequency 1x / week    PT Treatment/Interventions ADLs/Self Care Home Management;Aquatic Therapy;Cryotherapy;DME Instruction;Moist Heat;Electrical Stimulation;Gait training;Stair training;Functional mobility training;Therapeutic activities;Balance training;Therapeutic exercise;Neuromuscular re-education;Patient/family education;Orthotic Fit/Training;Passive range of motion;Vestibular;Manual techniques    PT Next Visit Plan continue to strengthen LLE to promote flexion, incorporate balance challenges in static and dynamic settings    Consulted and Agree with Plan of Care Patient           Patient will benefit from skilled therapeutic intervention in order to improve the following deficits and impairments:  Abnormal gait,Decreased coordination,Difficulty walking,Decreased range of motion,Decreased activity tolerance,Decreased balance,Decreased knowledge of use of DME,Decreased mobility,Decreased strength,Decreased endurance,Decreased safety awareness  Visit Diagnosis: Unsteadiness on feet  Muscle weakness (generalized)     Problem List Patient Active Problem List   Diagnosis Date Noted  . Febrile illness, acute 01/03/2021  . Slow transit constipation   . Sore throat   . Irregular cardiac rhythm   . PAF (paroxysmal atrial fibrillation) ([place])   . Labile blood pressure   . Hypokalemia   . Labile blood glucose   . Poorly controlled type 2 diabetes mellitus with peripheral neuropathy ([place])   . Stage 3b  chronic kidney disease ([place])   . Right pontine cerebrovascular accident ([place]) 12/27/2020  . Acute  CVA (cerebrovascular accident) ([place]) 12/24/2020  . Hyperlipidemia 10/24/2020  . Vitamin D deficiency 10/24/2020  . Fibroids 07/21/2020  . S/P TAH (total abdominal hysterectomy) 07/21/2020  . Chronic a-fib ([place]) 05/23/2020  . Hydroureter on right 05/23/2020  . Microalbuminuria 05/13/2020  . Non-ischemic cardiomyopathy ([place])   . Normocytic anemia   . Nephrolithiasis 02/14/2020  . Paroxysmal atrial fibrillation ([place]) 12/18/2019  . 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 Gross hematuria 12/18/2019  . Staghorn renal calculus 12/18/2019  . Umbilical hernia without obstruction and without gangrene 12/18/2019  . Uterine fibroid 10/25/2019  . Right hip pain   . Type 2 diabetes mellitus ([place])   . Essential hypertension   . CHF (congestive heart failure) ([place]) 10/21/2019  . Atrial fibrillation with RVR ([place]) 10/21/2019  . Atrial fibrillation with rapid ventricular response ([place]) 10/20/2019    Lanice Shirts PT 03/18/2021, 4:29 PM  [facility] [address] [place], [place], [address] Phone: [phone]   Fax:  [phone]  Name: Adrienne Lane MRN: [id number] Date of Birth: 05/21/1966

## 2021-03-20 ENCOUNTER — Other Ambulatory Visit: Payer: Self-pay | Admitting: Pharmacist

## 2021-03-20 ENCOUNTER — Other Ambulatory Visit: Payer: Self-pay | Admitting: Family Medicine

## 2021-03-20 ENCOUNTER — Telehealth: Payer: Self-pay | Admitting: Internal Medicine

## 2021-03-20 MED ORDER — INSULIN GLARGINE 100 UNIT/ML ~~LOC~~ SOLN: SUBCUTANEOUS | 2 refills | Status: DC

## 2021-03-20 MED FILL — $LANTUS 100 UNITS/ML VIAL: 100 | 28 days supply | Qty: 10 | Fill #0

## 2021-03-20 NOTE — Telephone Encounter (Signed)
PT requesting refill for   LANTUS 100 UNIT/ML injection  [facility]   Please advise and thank you.

## 2021-03-21 ENCOUNTER — Other Ambulatory Visit: Payer: Self-pay

## 2021-03-25 ENCOUNTER — Ambulatory Visit: Payer: Self-pay | Admitting: Physical Therapy

## 2021-03-25 ENCOUNTER — Other Ambulatory Visit (HOSPITAL_COMMUNITY): Payer: Self-pay | Admitting: Internal Medicine

## 2021-03-25 ENCOUNTER — Ambulatory Visit: Payer: Self-pay | Attending: Occupational Therapy | Admitting: Occupational Therapy

## 2021-03-25 ENCOUNTER — Encounter: Payer: Self-pay | Admitting: Occupational Therapy

## 2021-03-25 ENCOUNTER — Other Ambulatory Visit: Payer: Self-pay

## 2021-03-25 ENCOUNTER — Other Ambulatory Visit: Payer: Self-pay | Admitting: Internal Medicine

## 2021-03-25 ENCOUNTER — Encounter: Payer: Self-pay | Admitting: Physical Therapy

## 2021-03-25 DIAGNOSIS — R2681 Unsteadiness on feet: Secondary | ICD-10-CM | POA: Insufficient documentation

## 2021-03-25 DIAGNOSIS — R262 Difficulty in walking, not elsewhere classified: Secondary | ICD-10-CM | POA: Insufficient documentation

## 2021-03-25 DIAGNOSIS — R41842 Visuospatial deficit: Secondary | ICD-10-CM | POA: Insufficient documentation

## 2021-03-25 DIAGNOSIS — M6281 Muscle weakness (generalized): Secondary | ICD-10-CM | POA: Insufficient documentation

## 2021-03-25 DIAGNOSIS — R2689 Other abnormalities of gait and mobility: Secondary | ICD-10-CM | POA: Insufficient documentation

## 2021-03-25 DIAGNOSIS — M25612 Stiffness of left shoulder, not elsewhere classified: Secondary | ICD-10-CM | POA: Insufficient documentation

## 2021-03-25 DIAGNOSIS — M25512 Pain in left shoulder: Secondary | ICD-10-CM | POA: Insufficient documentation

## 2021-03-25 DIAGNOSIS — R278 Other lack of coordination: Secondary | ICD-10-CM | POA: Insufficient documentation

## 2021-03-25 MED ORDER — GLUCOSE BLOOD VI STRP
ORAL_STRIP | 12 refills | Status: DC
  Filled 2021-03-25: qty 100, 25d supply, fill #0

## 2021-03-25 MED ORDER — "INSULIN SYRINGE-NEEDLE U-100 30G X 5/16"" 0.3 ML MISC"
1 refills | Status: DC
  Filled 2021-03-25: qty 100, 25d supply, fill #0

## 2021-03-25 MED FILL — Insulin Syringe/Needle U-100 1/2 ML 30 x 5/16": 25 days supply | Qty: 100 | Fill #0 | Status: CN

## 2021-03-25 NOTE — Therapy (Signed)
[facility] [address] [place], [place], [address] Phone: [phone]   Fax:  [phone]  Physical Therapy Treatment  Patient Details  Name: Adrienne Lane MRN: [id number] Date of Birth: 1966/06/10 Referring Provider (PT): Referred by: Thurnell Lose, MD. PCP is Nicolette Bang, DO   Encounter Date: 03/25/2021   PT End of Session - 03/25/21 1112    Visit Number 6    Number of Visits 9    Authorization Type Medicaid (Pending)    PT Start Time 1108   pt running late for appt   PT Stop Time 1145    PT Time Calculation (min) 37 min    Equipment Utilized During Treatment Gait belt    Activity Tolerance Patient tolerated treatment well    Behavior During Therapy [facility] for tasks assessed/performed           Past Medical History:  Diagnosis Date  . Atrial fibrillation with RVR ([place])   . Bigeminy 01/2020  . CHF (congestive heart failure) ([place]) 10/20/2019  . CKD (chronic kidney disease), stage IV ([place])   . Diabetes mellitus without complication ([place])   . DOE (dyspnea on exertion)    walking upstairs or up hill resolves in one minute  . Fibroids   . History of kidney stones   . History of recent blood transfusion 02/26/2020  . Hypertension   . Iron deficiency anemia   . Non-ischemic cardiomyopathy (HCC)    tachycardia induced  . Obese   . Peripheral edema   . Premature ventricular contractions (PVCs) ([facility])   . Umbilical hernia   . Wears glasses     Past Surgical History:  Procedure Laterality Date  . CHOLECYSTECTOMY    . CYSTOSCOPY W/ URETERAL STENT PLACEMENT Bilateral 05/24/2020   Procedure: CYSTOSCOPY WITH RETROGRADE PYELOGRAM/URETERAL STENT PLACEMENT;  Surgeon: Robley Fries, MD;  [facility];  Service: Urology;  Laterality: Bilateral;  . CYSTOSCOPY/RETROGRADE/URETEROSCOPY Bilateral 04/03/2020   Procedure: CYSTOSCOPY/RETROGRADE/URETEROSCOPY;  Surgeon: Ardis Hughs, MD;  [facility];  Service: Urology;  Laterality: Bilateral;  . HYSTERECTOMY ABDOMINAL WITH SALPINGO-OOPHORECTOMY  07/21/2020   Procedure: HYSTERECTOMY ABDOMINAL WITH BILATERAL SALPINGO-OOPHORECTOMY;  Surgeon: Sanjuana Kava, MD;  Location: [facility];  Service: Gynecology;;  . IR FLUORO GUIDE CV LINE RIGHT  02/21/2020  . IR US GUIDE VASC ACCESS RIGHT  02/21/2020  . NEPHROLITHOTOMY Left 02/14/2020   Procedure: NEPHROLITHOTOMY PERCUTANEOUS/ SURGEON ACCESS/ LEFT PERCUTANEOUS NEPHROSTOMY TUBE PLACEMENT;  Surgeon: Ardis Hughs, MD;  [facility];  Service: Urology;  Laterality: Left;  . NEPHROLITHOTOMY Left 02/21/2020   Procedure: NEPHROLITHOTOMY PERCUTANEOUS SECOND LOOK;  Surgeon: Ardis Hughs, MD;  [facility];  Service: Urology;  Laterality: Left;  . NEPHROLITHOTOMY Left 02/26/2020   Procedure: NEPHROLITHOTOMY PERCUTANEOUS;  Surgeon: Ceasar Mons, MD;  [facility];  Service: Urology;  Laterality: Left;  NEED 150 MIN  . NEPHROLITHOTOMY Right 03/24/2020   Procedure: NEPHROLITHOTOMY PERCUTANEOUS WITH ACCESS LEFT STENT REMOVAL;  Surgeon: Ardis Hughs, MD;  [facility];  Service: Urology;  Laterality: Right;  . RIGHT HEART CATH N/A 11/09/2019   Procedure: RIGHT HEART CATH;  Surgeon: Jolaine Artist, MD;  Location: [facility];  Service: Cardiovascular;  Laterality: N/A;  . WISDOM TOOTH EXTRACTION      There were no vitals filed for this visit.   Subjective Assessment - 03/25/21 1109    Subjective No new complaints. Reports the hip is better. No falls. Continues with left  hand pain- OT addressing.    Pertinent History CVA, Diabetes, CKD Stage 3, A. fib, CHF, HTN, , Normocytic anemia, Hyperlipidemia, COVID - 19    Limitations Standing;Walking;House hold activities    Patient Stated Goals get strength back in LLE    Currently in Pain? Yes    Pain Score 4     Pain Location Hand    Pain Orientation Left    Pain Descriptors / Indicators Shooting    Pain Type Acute  pain    Pain Radiating Towards into the wrist and up the forearm    Pain Onset 1 to 4 weeks ago    Pain Frequency Intermittent    Aggravating Factors  closing hand, ulnar deviation    Pain Relieving Factors heat helps                    [facility] Adult PT Treatment/Exercise - 03/25/21 1113      Transfers   Transfers Sit to Stand;Stand to Sit    Sit to Stand 5: Supervision    Stand to Sit 5: Supervision      Ambulation/Gait   Ambulation/Gait Yes    Ambulation/Gait Assistance 5: Supervision;4: Min guard    Ambulation/Gait Assistance Details pt carried cane for ~400 of the 500 feet using it for the last portion due to LE fatigue. No balance issues noted with gait.    Ambulation Distance (Feet) 500 Feet   x1, plus aorund gym with session   Assistive device Straight cane    Gait Pattern Step-through pattern    Ambulation Surface Level;Unlevel;Indoor;Outdoor;Paved      High Level Balance   High Level Balance Activities Side stepping;Tandem walking;Marching forwards;Marching backwards   tandem/heel/toe walking forward/backwards   High Level Balance Comments on red/blue mats next to counter- 3 laps each/each way with no to light UE support on counter. cues on posture, weight shifting and ex form. min guard to min assist for balance.               Balance Exercises - 03/25/21 1136      Balance Exercises: Standing   Balance Beam standing across red foam beam: alternating forward stepping to floor/back onto beam, then alteranating backward stepping to floor/back onto beam for ~10 reps each/each way. no UE support with intermittent touch to bars for balance assistance. cues for increased step length/height to clear beam surface. min guard to min assist for balance.    Sit to Stand Standard surface;Without upper extremity support;Foam/compliant surface;Limitations    Sit to Stand Limitations seated at edge of mat with feet across red beam- sit<>stand for 5 reps with emphasis on tall  posture and controlled descent with min assist for controlled descent and to stabilize with standing.               PT Short Term Goals - 03/12/21 2152      PT SHORT TERM GOAL #1   Title Patient will be independent with initial HEP for balance/strengthening (STGs Due at 5th Visit)    Baseline 03/11/21: met with current HEP, advanced HEP this session    Status Achieved      PT SHORT TERM GOAL #2   Title Patient will improve 5x sit <> stand to </= 20 seconds to demonstrate improved balance    Baseline 03/11/21: 19.69 sec's with UE support from standard height chair    Status Achieved      PT SHORT TERM GOAL #3   Title Patient will  imrpove Berg Balance to >/= 46/56 to demonstrate reduced fall risk and improved balance    Baseline 03/11/21: 48/56 scored this session    Status Achieved      PT SHORT TERM GOAL #4   Title Patient will demo ability to ambulate >/= 500 ft with supervision without AD to demonstrate improved household mobility    Baseline 03/11/21: met distance portion of goal with supervision to min guard assist no AD    Status Partially Met             PT Long Term Goals - 02/02/21 1453      PT LONG TERM GOAL #1   Title Patient will be independent with Final HEP for strengthening/balance (ALL LTGs Due at 9th Visit)    Baseline no HEP established    Time 8    Period --   visits   Status New      PT LONG TERM GOAL #2   Title Patient will improve TUG to </= 10 seconds to demo improved balance and reduced fall risk    Baseline 12.25 secs    Time 8    Period --   visits   Status New      PT LONG TERM GOAL #3   Title Patient will improve Berg Balance to >/= 50/56 to demonstrate decreased fall risk and improved balance    Baseline 43/56    Time 8    Period --   visits   Status New      PT LONG TERM GOAL #4   Title Patient will improve gait speed to >/= 2.75 ft/sec to demonstrate improved community ambulation    Baseline 2.42 ft/sec    Time 8    Period --    visits   Status New      PT LONG TERM GOAL #5   Title Patient will improve 5x sit <> stand to </= 15 seconds to demonstrate reduce fall risk and improved balance    Baseline 24.72 secs    Time 8    Period --   visits   Status New      Additional Long Term Goals   Additional Long Term Goals Yes      PT LONG TERM GOAL #6   Title Patient will demo abilit yto ambulate >/= 500 ft on various outdoor surfaces for improved community mobility    Baseline TBA    Time 8    Period --   visits   Status New                 Plan - 03/25/21 1112    Clinical Impression Statement Today's skilled session continued to focus on gait on various surfaces with cane and balance training on compliant surfaces with no issues reported or noted in session. The pt is making steady progress toward goals and should benefit from continued PT to progress toward unmet goals.    Personal Factors and Comorbidities Comorbidity 3+    Comorbidities CVA, Diabetes, CKD Stage 3, A. fib, CHF, HTN, , Normocytic anemia, Hyperlipidemia, COVID - 19    Examination-Activity Limitations Bed Mobility;Transfers;Stand;Stairs    Examination-Participation Restrictions Church;Community Activity;Laundry;Cleaning    Stability/Clinical Decision Making Evolving/Moderate complexity    Rehab Potential Good    PT Frequency 1x / week    PT Treatment/Interventions ADLs/Self Care Home Management;Aquatic Therapy;Cryotherapy;DME Instruction;Moist Heat;Electrical Stimulation;Gait training;Stair training;Functional mobility training;Therapeutic activities;Balance training;Therapeutic exercise;Neuromuscular re-education;Patient/family education;Orthotic Fit/Training;Passive range of motion;Vestibular;Manual techniques    PT Next  Visit Plan continue to strengthen LLE to promote flexion, incorporate balance challenges in static and dynamic settings    Consulted and Agree with Plan of Care Patient           Patient will benefit from skilled  therapeutic intervention in order to improve the following deficits and impairments:  Abnormal gait,Decreased coordination,Difficulty walking,Decreased range of motion,Decreased activity tolerance,Decreased balance,Decreased knowledge of use of DME,Decreased mobility,Decreased strength,Decreased endurance,Decreased safety awareness  Visit Diagnosis: Unsteadiness on feet  Difficulty in walking, not elsewhere classified     Problem List Patient Active Problem List   Diagnosis Date Noted  . Febrile illness, acute 01/03/2021  . Slow transit constipation   . Sore throat   . Irregular cardiac rhythm   . PAF (paroxysmal atrial fibrillation) ([place])   . Labile blood pressure   . Hypokalemia   . Labile blood glucose   . Poorly controlled type 2 diabetes mellitus with peripheral neuropathy ([place])   . Stage 3b chronic kidney disease ([place])   . Right pontine cerebrovascular accident ([place]) 12/27/2020  . Acute CVA (cerebrovascular accident) ([place]) 12/24/2020  . Hyperlipidemia 10/24/2020  . Vitamin D deficiency 10/24/2020  . Fibroids 07/21/2020  . S/P TAH (total abdominal hysterectomy) 07/21/2020  . Chronic a-fib ([place]) 05/23/2020  . Hydroureter on right 05/23/2020  . Microalbuminuria 05/13/2020  . Non-ischemic cardiomyopathy ([place])   . Normocytic anemia   . Nephrolithiasis 02/14/2020  . Paroxysmal atrial fibrillation ([place]) 12/18/2019  . 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 Gross hematuria 12/18/2019  . Staghorn renal calculus 12/18/2019  . Umbilical hernia without obstruction and without gangrene 12/18/2019  . Uterine fibroid 10/25/2019  . Right hip pain   . Type 2 diabetes mellitus ([place])   . Essential hypertension   . CHF (congestive heart failure) ([place]) 10/21/2019  . Atrial fibrillation with RVR ([place]) 10/21/2019  . Atrial fibrillation with rapid ventricular response ([place]) 10/20/2019    Willow Ora, PTA, [facility] [address] [place], [place]  [id number] [phone] 03/25/21, 10:01 PM   Name: Adrienne Lane MRN: [id number] Date of Birth: 07-11-1966

## 2021-03-25 NOTE — Therapy (Signed)
[facility] [address] [place], [place], [address] Phone: [phone]   Fax:  [phone]  Occupational Therapy Treatment  Patient Details  Name: Adrienne Lane MRN: [id number] Date of Birth: 20-Jan-1966 Referring Provider (OT): Lala Lund, MD   Encounter Date: 03/25/2021   OT End of Session - 03/25/21 1149    Visit Number 6    Number of Visits 9    Date for OT Re-Evaluation 88/50/27   cert 90 days - 7/41/2878   Authorization Type Medicaid Pending - self pay currently    OT Start Time 1148    OT Stop Time 1230    OT Time Calculation (min) 42 min    Activity Tolerance Patient tolerated treatment well    Behavior During Therapy [facility] for tasks assessed/performed           Past Medical History:  Diagnosis Date  . Atrial fibrillation with RVR ([place])   . Bigeminy 01/2020  . CHF (congestive heart failure) ([place]) 10/20/2019  . CKD (chronic kidney disease), stage IV ([place])   . Diabetes mellitus without complication ([place])   . DOE (dyspnea on exertion)    walking upstairs or up hill resolves in one minute  . Fibroids   . History of kidney stones   . History of recent blood transfusion 02/26/2020  . Hypertension   . Iron deficiency anemia   . Non-ischemic cardiomyopathy (HCC)    tachycardia induced  . Obese   . Peripheral edema   . Premature ventricular contractions (PVCs) (VPCs)   . Umbilical hernia   . Wears glasses     Past Surgical History:  Procedure Laterality Date  . CHOLECYSTECTOMY    . CYSTOSCOPY W/ URETERAL STENT PLACEMENT Bilateral 05/24/2020   Procedure: CYSTOSCOPY WITH RETROGRADE PYELOGRAM/URETERAL STENT PLACEMENT;  Surgeon: Robley Fries, MD;  [facility];  Service: Urology;  Laterality: Bilateral;  . CYSTOSCOPY/RETROGRADE/URETEROSCOPY Bilateral 04/03/2020   Procedure: CYSTOSCOPY/RETROGRADE/URETEROSCOPY;  Surgeon: Ardis Hughs, MD;  [facility];  Service: Urology;  Laterality:  Bilateral;  . HYSTERECTOMY ABDOMINAL WITH SALPINGO-OOPHORECTOMY  07/21/2020   Procedure: HYSTERECTOMY ABDOMINAL WITH BILATERAL SALPINGO-OOPHORECTOMY;  Surgeon: Sanjuana Kava, MD;  Location: [facility];  Service: Gynecology;;  . IR FLUORO GUIDE CV LINE RIGHT  02/21/2020  . IR US GUIDE VASC ACCESS RIGHT  02/21/2020  . NEPHROLITHOTOMY Left 02/14/2020   Procedure: NEPHROLITHOTOMY PERCUTANEOUS/ SURGEON ACCESS/ LEFT PERCUTANEOUS NEPHROSTOMY TUBE PLACEMENT;  Surgeon: Ardis Hughs, MD;  [facility];  Service: Urology;  Laterality: Left;  . NEPHROLITHOTOMY Left 02/21/2020   Procedure: NEPHROLITHOTOMY PERCUTANEOUS SECOND LOOK;  Surgeon: Ardis Hughs, MD;  [facility];  Service: Urology;  Laterality: Left;  . NEPHROLITHOTOMY Left 02/26/2020   Procedure: NEPHROLITHOTOMY PERCUTANEOUS;  Surgeon: Ceasar Mons, MD;  [facility];  Service: Urology;  Laterality: Left;  NEED 150 MIN  . NEPHROLITHOTOMY Right 03/24/2020   Procedure: NEPHROLITHOTOMY PERCUTANEOUS WITH ACCESS LEFT STENT REMOVAL;  Surgeon: Ardis Hughs, MD;  [facility];  Service: Urology;  Laterality: Right;  . RIGHT HEART CATH N/A 11/09/2019   Procedure: RIGHT HEART CATH;  Surgeon: Jolaine Artist, MD;  Location: [facility];  Service: Cardiovascular;  Laterality: N/A;  . WISDOM TOOTH EXTRACTION      There were no vitals filed for this visit.   Subjective Assessment - 03/25/21 1149    Subjective  "Same thing - just some pain in my hand and wrist (L)" Pt reports no pain currently but  with stretching.    Pertinent History R Pontine CVA, DM, HTN, CKD, CHF    Limitations fall risk. not driving.    Patient Stated Goals get stronger and get back to normal    Currently in Pain? No/denies              TREATMENT:  Fluidotherapy x 10 minutes for LUE to address pain, swelling, and stiffness. No adverse reactions. AROM while in modality.  Self PROM for composite flex/ext - encouraged patient to  continue doing PROM at home for increasing composite flex/ext to increase functional use  Digiflex 5.0 lb with LUE x 12 reps  Small Peg Board with LUE for fine motor coordination and with copying pattern with min/1 verbal cue. Pt required increased time and did task with min drops.               OT Short Term Goals - 03/25/21 1208      OT SHORT TERM GOAL #1   Title Pt will be independent with coordination HEP for LUE 03/02/2021    Baseline not issued yet    Time 4    Period Weeks    Status On-going    Target Date 03/02/21      OT SHORT TERM GOAL #2   Title Pt will demonstrate improved LUE functional use by improving Box and Blocks score to 42 or greater.    Baseline RUE 52 LUE 36    Time 4    Period Weeks    Status On-going   LUE 39 blocks 03/11/2021     OT SHORT TERM GOAL #3   Title Pt will improve grip strength in LUE to 18 lbs or greater for increase for clothing management and overall functional use.    Baseline RUE 44.7 LUE 13.8    Time 4    Period Weeks    Status On-going   11 lbs in LUE 03/25/2021     OT SHORT TERM GOAL #4   Title Pt will report increased speed and accuracy with basic ADLs (i.e. tying shoes, washing up, etc)    Time 4    Period Weeks    Status Achieved   pt reports "its a little better" 02/23/21            OT Long Term Goals - 03/25/21 1211      OT LONG TERM GOAL #1   Title Pt will be independent with updated HEP for LUE strengthening and coordination    Baseline not issued yet    Time 8    Period Weeks    Status New      OT LONG TERM GOAL #2   Title Pt will improve LUE fine motor coordination by completing 9 hole peg test with LUE in 50 seconds or less *REVISED: 30 seconds or less with LUE    Baseline LUE 68.37, RUE 24.5    Time 8    Period Weeks    Status Revised   03/11/2021 LUE 38.63s     OT LONG TERM GOAL #3   Title Pt will improve grip strength in LUE to 25 lbs or greater for increasing overall functional use of LUE.     Baseline LUE 13.8 RUE 44.7    Time 8    Period Weeks    Status New      OT LONG TERM GOAL #4   Title Pt will improve standing tolerance to 20 minutes or greater in order to increase ability to  complete home management tasks with improved activity tolerance    Baseline 10 minutes before requiring break    Time 8    Period Weeks    Status New                 Plan - 03/25/21 1228    Clinical Impression Statement Pt continues to have decreased grip strength in LUE. Pt benefits from fluidotherapy/heat for managing stifness and pain in LUE    OT Occupational Profile and History Problem Focused Assessment - Including review of records relating to presenting problem    Occupational performance deficits (Please refer to evaluation for details): ADL's;IADL's;Leisure    Body Structure / Function / Physical Skills ADL;IADL;Coordination;FMC;ROM;UE functional use;GMC;Vision;Strength;Dexterity;Decreased knowledge of use of DME;Balance;Pain    Rehab Potential Good    Clinical Decision Making Limited treatment options, no task modification necessary    Comorbidities Affecting Occupational Performance: None    Modification or Assistance to Complete Evaluation  No modification of tasks or assist necessary to complete eval    OT Frequency 1x / week    OT Duration 8 weeks   8 visits + eval   OT Treatment/Interventions Self-care/ADL training;Cryotherapy;Fluidtherapy;DME and/or AE instruction;Passive range of motion;Manual Therapy;Functional Mobility Training;Neuromuscular education;Moist Heat;Therapeutic exercise;Patient/family education;Therapeutic activities;Visual/perceptual remediation/compensation;Energy conservation;Paraffin    Plan continue LUE grip strength and coordination. fluido LUE for stiffness    Consulted and Agree with Plan of Care Patient           Patient will benefit from skilled therapeutic intervention in order to improve the following deficits and impairments:   Body  Structure / Function / Physical Skills: ADL,IADL,Coordination,FMC,ROM,UE functional use,GMC,Vision,Strength,Dexterity,Decreased knowledge of use of DME,Balance,Pain       Visit Diagnosis: Muscle weakness (generalized)  Stiffness of left shoulder, not elsewhere classified  Other lack of coordination  Visuospatial deficit  Other abnormalities of gait and mobility  Acute pain of left shoulder    Problem List Patient Active Problem List   Diagnosis Date Noted  . Febrile illness, acute 01/03/2021  . Slow transit constipation   . Sore throat   . Irregular cardiac rhythm   . PAF (paroxysmal atrial fibrillation) ([place])   . Labile blood pressure   . Hypokalemia   . Labile blood glucose   . Poorly controlled type 2 diabetes mellitus with peripheral neuropathy ([place])   . Stage 3b chronic kidney disease ([place])   . Right pontine cerebrovascular accident ([place]) 12/27/2020  . Acute CVA (cerebrovascular accident) ([place]) 12/24/2020  . Hyperlipidemia 10/24/2020  . Vitamin D deficiency 10/24/2020  . Fibroids 07/21/2020  . S/P TAH (total abdominal hysterectomy) 07/21/2020  . Chronic a-fib ([place]) 05/23/2020  . Hydroureter on right 05/23/2020  . Microalbuminuria 05/13/2020  . Non-ischemic cardiomyopathy ([place])   . Normocytic anemia   . Nephrolithiasis 02/14/2020  . Paroxysmal atrial fibrillation ([place]) 12/18/2019  . 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 Gross hematuria 12/18/2019  . Staghorn renal calculus 12/18/2019  . Umbilical hernia without obstruction and without gangrene 12/18/2019  . Uterine fibroid 10/25/2019  . Right hip pain   . Type 2 diabetes mellitus ([place])   . Essential hypertension   . CHF (congestive heart failure) ([place]) 10/21/2019  . Atrial fibrillation with RVR ([place]) 10/21/2019  . Atrial fibrillation with rapid ventricular response [facility]) 10/20/2019    Zachery Conch MOT, OTR/L  03/25/2021, 12:29 PM  [facility] [address] [place], [place], [address] Phone: [phone]   Fax:  [phone]  Name:  Adrienne Lane MRN: [id number] Date of Birth: 05-28-1966

## 2021-03-26 ENCOUNTER — Other Ambulatory Visit: Payer: Self-pay

## 2021-04-01 ENCOUNTER — Other Ambulatory Visit: Payer: Self-pay | Admitting: Pharmacist

## 2021-04-01 ENCOUNTER — Ambulatory Visit: Payer: Self-pay

## 2021-04-01 ENCOUNTER — Other Ambulatory Visit: Payer: Self-pay | Admitting: Internal Medicine

## 2021-04-01 ENCOUNTER — Other Ambulatory Visit: Payer: Self-pay

## 2021-04-01 ENCOUNTER — Ambulatory Visit: Payer: Self-pay | Admitting: Occupational Therapy

## 2021-04-01 DIAGNOSIS — R2681 Unsteadiness on feet: Secondary | ICD-10-CM

## 2021-04-01 DIAGNOSIS — M6281 Muscle weakness (generalized): Secondary | ICD-10-CM

## 2021-04-01 DIAGNOSIS — M25612 Stiffness of left shoulder, not elsewhere classified: Secondary | ICD-10-CM

## 2021-04-01 DIAGNOSIS — R41842 Visuospatial deficit: Secondary | ICD-10-CM

## 2021-04-01 DIAGNOSIS — R262 Difficulty in walking, not elsewhere classified: Secondary | ICD-10-CM

## 2021-04-01 DIAGNOSIS — R278 Other lack of coordination: Secondary | ICD-10-CM

## 2021-04-01 DIAGNOSIS — R2689 Other abnormalities of gait and mobility: Secondary | ICD-10-CM

## 2021-04-01 MED ORDER — TRUE METRIX BLOOD GLUCOSE TEST VI STRP
ORAL_STRIP | 2 refills | Status: DC
  Filled 2021-04-01: qty 100, 30d supply, fill #0
  Filled 2021-04-08: qty 100, 25d supply, fill #0
  Filled 2021-07-03: qty 100, 25d supply, fill #1
  Filled 2022-01-26: qty 100, 25d supply, fill #0

## 2021-04-01 NOTE — Therapy (Signed)
[facility] [address] [place], [place], [address] Phone: [phone]   Fax:  [phone]  Physical Therapy Treatment  Patient Details  Name: Adrienne Lane MRN: [id number] Date of Birth: 12/13/1966 Referring Provider (PT): Referred by: Thurnell Lose, MD. PCP is Nicolette Bang, DO   Encounter Date: 04/01/2021   PT End of Session - 04/01/21 1111    Visit Number 7    Number of Visits 9    Authorization Type Medicaid (Pending)    PT Start Time 1104    PT Stop Time 1145    PT Time Calculation (min) 41 min    Equipment Utilized During Treatment Gait belt    Activity Tolerance Patient tolerated treatment well    Behavior During Therapy [facility] for tasks assessed/performed           Past Medical History:  Diagnosis Date  . Atrial fibrillation with RVR ([place])   . Bigeminy 01/2020  . CHF (congestive heart failure) ([place]) 10/20/2019  . CKD (chronic kidney disease), stage IV ([place])   . Diabetes mellitus without complication ([place])   . DOE (dyspnea on exertion)    walking upstairs or up hill resolves in one minute  . Fibroids   . History of kidney stones   . History of recent blood transfusion 02/26/2020  . Hypertension   . Iron deficiency anemia   . Non-ischemic cardiomyopathy (HCC)    tachycardia induced  . Obese   . Peripheral edema   . Premature ventricular contractions (PVCs) (VPCs)   . Umbilical hernia   . Wears glasses     Past Surgical History:  Procedure Laterality Date  . CHOLECYSTECTOMY    . CYSTOSCOPY W/ URETERAL STENT PLACEMENT Bilateral 05/24/2020   Procedure: CYSTOSCOPY WITH RETROGRADE PYELOGRAM/URETERAL STENT PLACEMENT;  Surgeon: Robley Fries, MD;  [facility];  Service: Urology;  Laterality: Bilateral;  . CYSTOSCOPY/RETROGRADE/URETEROSCOPY Bilateral 04/03/2020   Procedure: CYSTOSCOPY/RETROGRADE/URETEROSCOPY;  Surgeon: Ardis Hughs, MD;  [facility];  Service: Urology;   Laterality: Bilateral;  . HYSTERECTOMY ABDOMINAL WITH SALPINGO-OOPHORECTOMY  07/21/2020   Procedure: HYSTERECTOMY ABDOMINAL WITH BILATERAL SALPINGO-OOPHORECTOMY;  Surgeon: Sanjuana Kava, MD;  Location: [facility];  Service: Gynecology;;  . IR FLUORO GUIDE CV LINE RIGHT  02/21/2020  . IR US GUIDE VASC ACCESS RIGHT  02/21/2020  . NEPHROLITHOTOMY Left 02/14/2020   Procedure: NEPHROLITHOTOMY PERCUTANEOUS/ SURGEON ACCESS/ LEFT PERCUTANEOUS NEPHROSTOMY TUBE PLACEMENT;  Surgeon: Ardis Hughs, MD;  [facility];  Service: Urology;  Laterality: Left;  . NEPHROLITHOTOMY Left 02/21/2020   Procedure: NEPHROLITHOTOMY PERCUTANEOUS SECOND LOOK;  Surgeon: Ardis Hughs, MD;  [facility];  Service: Urology;  Laterality: Left;  . NEPHROLITHOTOMY Left 02/26/2020   Procedure: NEPHROLITHOTOMY PERCUTANEOUS;  Surgeon: Ceasar Mons, MD;  [facility];  Service: Urology;  Laterality: Left;  NEED 150 MIN  . NEPHROLITHOTOMY Right 03/24/2020   Procedure: NEPHROLITHOTOMY PERCUTANEOUS WITH ACCESS LEFT STENT REMOVAL;  Surgeon: Ardis Hughs, MD;  [facility];  Service: Urology;  Laterality: Right;  . RIGHT HEART CATH N/A 11/09/2019   Procedure: RIGHT HEART CATH;  Surgeon: Jolaine Artist, MD;  Location: [facility];  Service: Cardiovascular;  Laterality: N/A;  . WISDOM TOOTH EXTRACTION      There were no vitals filed for this visit.   Subjective Assessment - 04/01/21 1107    Subjective Patient reports has stopped using the [facility], and is feeling good without. No falls. No hip pain today.  Pertinent History CVA, Diabetes, CKD Stage 3, A. fib, CHF, HTN, , Normocytic anemia, Hyperlipidemia, COVID - 19    Limitations Standing;Walking;House hold activities    Patient Stated Goals get strength back in LLE    Currently in Pain? No/denies    Pain Onset 1 to 4 weeks ago              [facility] Adult PT Treatment/Exercise - 04/01/21 0001      Transfers   Transfers Sit to Stand;Stand to  Sit    Sit to Stand 5: Supervision    Stand to Sit 5: Supervision      Ambulation/Gait   Ambulation/Gait Yes    Ambulation/Gait Assistance 5: Supervision    Ambulation/Gait Assistance Details patient ambulating without SPC today, did not bring into the session. Completed gait training on outdoor surfaces without AD (including grass/pavement) x 600 ft.    Ambulation Distance (Feet) 600 Feet   clinic distances   Assistive device None    Gait Pattern Step-through pattern    Ambulation Surface Level;Indoor;Unlevel;Outdoor;Paved;Grass    Stairs Yes    Stairs Assistance 5: Supervision    Stairs Assistance Details (indicate cue type and reason) Patient able to ascend/descend with reciprocal stepping and single rail x 12 stairs.    Stair Management Technique One rail Right;Alternating pattern;Forwards    Number of Stairs 12    Height of Stairs 4      Therapeutic Activites    Therapeutic Activities Other Therapeutic Activities    Other Therapeutic Activities Simualated patient opening door, patient demo increased challenge with opening with LUE and walking through, PT educating on opening knob with LUE and pushing door with RUE, complted x 3 reps problem solving.      Exercises   Exercises Knee/Hip      Knee/Hip Exercises: Aerobic   Other Aerobic Completed SciFit with BLE/BUE x 6 minutes on Level 2.5 for improved stregthening and activity tolerance/endurance.               Balance Exercises - 04/01/21 0001      Balance Exercises: Standing   Stepping Strategy Anterior;Foam/compliant surface;Limitations    Stepping Strategy Limitations completed anterior stepping strategy x 10 reps on airex pad. UE support required initially then progressing to no UE support.    Rockerboard Anterior/posterior;Lateral;EO;Head turns;Intermittent UE support;Limitations    Rockerboard Limitations standing on rockerboard A/P: completed horization/vertical head turns x 10 reps each, increased challenge with  looking down. then completed A/P weight shifts x 15 reps. On board positioned lateral: completed horizontal head turns x 10 reps, followed by lateral weight shifts x 15 reps eahc direction.. increased challenge with control noted at times.               PT Short Term Goals - 03/12/21 2152      PT SHORT TERM GOAL #1   Title Patient will be independent with initial HEP for balance/strengthening (STGs Due at 5th Visit)    Baseline 03/11/21: met with current HEP, advanced HEP this session    Status Achieved      PT SHORT TERM GOAL #2   Title Patient will improve 5x sit <> stand to </= 20 seconds to demonstrate improved balance    Baseline 03/11/21: 19.69 sec's with UE support from standard height chair    Status Achieved      PT SHORT TERM GOAL #3   Title Patient will imrpove Berg Balance to >/= 46/56 to demonstrate reduced fall risk and improved  balance    Baseline 03/11/21: 48/56 scored this session    Status Achieved      PT SHORT TERM GOAL #4   Title Patient will demo ability to ambulate >/= 500 ft with supervision without AD to demonstrate improved household mobility    Baseline 03/11/21: met distance portion of goal with supervision to min guard assist no AD    Status Partially Met             PT Long Term Goals - 04/01/21 1114      PT LONG TERM GOAL #1   Title Patient will be independent with Final HEP for strengthening/balance (ALL LTGs Due at 9th Visit)    Baseline no HEP established    Time 8    Period --   visits   Status New      PT LONG TERM GOAL #2   Title Patient will improve TUG to </= 10 seconds to demo improved balance and reduced fall risk    Baseline 12.25 secs    Time 8    Period --   visits   Status New      PT LONG TERM GOAL #3   Title Patient will improve Berg Balance to >/= 50/56 to demonstrate decreased fall risk and improved balance    Baseline 43/56    Time 8    Period --   visits   Status New      PT LONG TERM GOAL #4   Title Patient  will improve gait speed to >/= 2.75 ft/sec to demonstrate improved community ambulation    Baseline 2.42 ft/sec    Time 8    Period --   visits   Status New      PT LONG TERM GOAL #5   Title Patient will improve 5x sit <> stand to </= 15 seconds to demonstrate reduce fall risk and improved balance    Baseline 24.72 secs    Time 8    Period --   visits   Status New      PT LONG TERM GOAL #6   Title Patient will demo abilityto ambulate >/= 500 ft on various outdoor surfaces for improved community mobility    Baseline 600 ft outdoors various surfaces without AD, Supervision    Time 8    Period --   visits   Status Achieved                 Plan - 04/01/21 1201    Clinical Impression Statement Today's skilled session focused on beginning assessing patient progress toward LTG. Patient able to ambulate 600 ft outdoors on unevel surfaces with supevision. Continued activites working toward improved functional mobility, strength, and balance with patient tolerating well. Will continue to progress toward all LTGs. .    Personal Factors and Comorbidities Comorbidity 3+    Comorbidities CVA, Diabetes, CKD Stage 3, A. fib, CHF, HTN, , Normocytic anemia, Hyperlipidemia, COVID - 19    Examination-Activity Limitations Bed Mobility;Transfers;Stand;Stairs    Examination-Participation Restrictions Church;Community Activity;Laundry;Cleaning    Stability/Clinical Decision Making Evolving/Moderate complexity    Rehab Potential Good    PT Frequency 1x / week    PT Treatment/Interventions ADLs/Self Care Home Management;Aquatic Therapy;Cryotherapy;DME Instruction;Moist Heat;Electrical Stimulation;Gait training;Stair training;Functional mobility training;Therapeutic activities;Balance training;Therapeutic exercise;Neuromuscular re-education;Patient/family education;Orthotic Fit/Training;Passive range of motion;Vestibular;Manual techniques    PT Next Visit Plan check LTGs and D/C    Consulted and Agree  with Plan of Care Patient  Patient will benefit from skilled therapeutic intervention in order to improve the following deficits and impairments:  Abnormal gait,Decreased coordination,Difficulty walking,Decreased range of motion,Decreased activity tolerance,Decreased balance,Decreased knowledge of use of DME,Decreased mobility,Decreased strength,Decreased endurance,Decreased safety awareness  Visit Diagnosis: Unsteadiness on feet  Difficulty in walking, not elsewhere classified  Muscle weakness (generalized)  Other abnormalities of gait and mobility     Problem List Patient Active Problem List   Diagnosis Date Noted  . Febrile illness, acute 01/03/2021  . Slow transit constipation   . Sore throat   . Irregular cardiac rhythm   . PAF (paroxysmal atrial fibrillation) ([place])   . Labile blood pressure   . Hypokalemia   . Labile blood glucose   . Poorly controlled type 2 diabetes mellitus with peripheral neuropathy ([place])   . Stage 3b chronic kidney disease ([place])   . Right pontine cerebrovascular accident ([place]) 12/27/2020  . Acute CVA (cerebrovascular accident) ([place]) 12/24/2020  . Hyperlipidemia 10/24/2020  . Vitamin D deficiency 10/24/2020  . Fibroids 07/21/2020  . S/P TAH (total abdominal hysterectomy) 07/21/2020  . Chronic a-fib ([place]) 05/23/2020  . Hydroureter on right 05/23/2020  . Microalbuminuria 05/13/2020  . Non-ischemic cardiomyopathy ([place])   . Normocytic anemia   . Nephrolithiasis 02/14/2020  . Paroxysmal atrial fibrillation ([place]) 12/18/2019  . 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 Gross hematuria 12/18/2019  . Staghorn renal calculus 12/18/2019  . Umbilical hernia without obstruction and without gangrene 12/18/2019  . Uterine fibroid 10/25/2019  . Right hip pain   . Type 2 diabetes mellitus ([place])   . Essential hypertension   . CHF (congestive heart failure) ([place]) 10/21/2019  . Atrial fibrillation with RVR ([place]) 10/21/2019  . Atrial fibrillation with rapid  ventricular response ([place]) 10/20/2019    Jones Bales, PT, DPT 04/01/2021, 12:05 PM  [facility] [address] [place], [place], [address] Phone: [phone]   Fax:  [phone]  Name: Ajaya Crutchfield MRN: [id number] Date of Birth: Oct 11, 1966

## 2021-04-01 NOTE — Therapy (Signed)
[facility] [address] [place], [place], [address] Phone: [phone]   Fax:  [phone]  Occupational Therapy Treatment  Patient Details  Name: Adrienne Lane MRN: [id number] Date of Birth: 03/05/1966 Referring Provider (OT): Lala Lund, MD   Encounter Date: 04/01/2021   OT End of Session - 04/01/21 1148    Visit Number 7    Number of Visits 9    Date for OT Re-Evaluation 94/07/68   cert 90 days - 0/88/1103   Authorization Type Medicaid Pending - self pay currently    OT Start Time 1147    OT Stop Time 1230    OT Time Calculation (min) 43 min    Activity Tolerance Patient tolerated treatment well    Behavior During Therapy [facility] for tasks assessed/performed           Past Medical History:  Diagnosis Date  . Atrial fibrillation with RVR ([place])   . Bigeminy 01/2020  . CHF (congestive heart failure) ([place]) 10/20/2019  . CKD (chronic kidney disease), stage IV ([place])   . Diabetes mellitus without complication ([place])   . DOE (dyspnea on exertion)    walking upstairs or up hill resolves in one minute  . Fibroids   . History of kidney stones   . History of recent blood transfusion 02/26/2020  . Hypertension   . Iron deficiency anemia   . Non-ischemic cardiomyopathy (HCC)    tachycardia induced  . Obese   . Peripheral edema   . Premature ventricular contractions (PVCs) (VPCs)   . Umbilical hernia   . Wears glasses     Past Surgical History:  Procedure Laterality Date  . CHOLECYSTECTOMY    . CYSTOSCOPY W/ URETERAL STENT PLACEMENT Bilateral 05/24/2020   Procedure: CYSTOSCOPY WITH RETROGRADE PYELOGRAM/URETERAL STENT PLACEMENT;  Surgeon: Robley Fries, MD;  [facility]: [facility];  Service: Urology;  Laterality: Bilateral;  . CYSTOSCOPY/RETROGRADE/URETEROSCOPY Bilateral 04/03/2020   Procedure: CYSTOSCOPY/RETROGRADE/URETEROSCOPY;  Surgeon: Ardis Hughs, MD;  [facility];  Service: Urology;  Laterality:  Bilateral;  . HYSTERECTOMY ABDOMINAL WITH SALPINGO-OOPHORECTOMY  07/21/2020   Procedure: HYSTERECTOMY ABDOMINAL WITH BILATERAL SALPINGO-OOPHORECTOMY;  Surgeon: Sanjuana Kava, MD;  Location: [facility];  Service: Gynecology;;  . IR FLUORO GUIDE CV LINE RIGHT  02/21/2020  . IR US GUIDE VASC ACCESS RIGHT  02/21/2020  . NEPHROLITHOTOMY Left 02/14/2020   Procedure: NEPHROLITHOTOMY PERCUTANEOUS/ SURGEON ACCESS/ LEFT PERCUTANEOUS NEPHROSTOMY TUBE PLACEMENT;  Surgeon: Ardis Hughs, MD;  [facility];  Service: Urology;  Laterality: Left;  . NEPHROLITHOTOMY Left 02/21/2020   Procedure: NEPHROLITHOTOMY PERCUTANEOUS SECOND LOOK;  Surgeon: Ardis Hughs, MD;  [facility];  Service: Urology;  Laterality: Left;  . NEPHROLITHOTOMY Left 02/26/2020   Procedure: NEPHROLITHOTOMY PERCUTANEOUS;  Surgeon: Ceasar Mons, MD;  [facility];  Service: Urology;  Laterality: Left;  NEED 150 MIN  . NEPHROLITHOTOMY Right 03/24/2020   Procedure: NEPHROLITHOTOMY PERCUTANEOUS WITH ACCESS LEFT STENT REMOVAL;  Surgeon: Ardis Hughs, MD;  [facility];  Service: Urology;  Laterality: Right;  . RIGHT HEART CATH N/A 11/09/2019   Procedure: RIGHT HEART CATH;  Surgeon: Jolaine Artist, MD;  Location: [facility];  Service: Cardiovascular;  Laterality: N/A;  . WISDOM TOOTH EXTRACTION      There were no vitals filed for this visit.   Subjective Assessment - 04/01/21 1148    Subjective  When I'm at rest theres no pain. Pt reports the stretching is helpful to the LUE.  Pertinent History R [place] CVA, DM, HTN, CKD, CHF    Limitations fall risk. not driving.    Patient Stated Goals get stronger and get back to normal    Currently in Pain? No/denies           TREATMENT:  Checked goals. See below.  Resistance Clothespins with LUE for increase in manipulation and grip strength for manipulating clothespins 1-8 #.   Grooved Pegs w LUE with increased accuracy and speed from previous  session. No drops.  Stacking pennies and in hand manipulation with LUE with holding dice in palm for encouraging increased pincer grasp. Pt with min difficulty and min drops.                       OT Short Term Goals - 04/01/21 1156      OT SHORT TERM GOAL #1   Title Pt will be independent with coordination HEP for LUE 03/02/2021    Baseline not issued yet    Time 4    Period Weeks    Status Achieved    Target Date 03/02/21      OT SHORT TERM GOAL #2   Title Pt will demonstrate improved LUE functional use by improving Box and Blocks score to 42 or greater.    Baseline RUE 52 LUE 36    Time 4    Period Weeks    Status Achieved   LUE 46 blocks 04/01/21     OT SHORT TERM GOAL #3   Title Pt will improve grip strength in LUE to 18 lbs or greater for increase for clothing management and overall functional use.    Baseline RUE 44.7 LUE 13.8    Time 4    Period Weeks    Status On-going   10.5 lbs in LUE 04/01/2021     OT SHORT TERM GOAL #4   Title Pt will report increased speed and accuracy with basic ADLs (i.e. tying shoes, washing up, etc)    Time 4    Period Weeks    Status Achieved   pt reports "its a little better" 02/23/21            OT Long Term Goals - 04/01/21 1153      OT LONG TERM GOAL #1   Title Pt will be independent with updated HEP for LUE strengthening and coordination    Baseline not issued yet    Time 8    Period Weeks    Status On-going      OT LONG TERM GOAL #2   Title Pt will improve LUE fine motor coordination by completing 9 hole peg test with LUE in 50 seconds or less *REVISED: 30 seconds or less with LUE    Baseline LUE 68.37, RUE 24.5    Time 8    Period Weeks    Status On-going   04/01/21 LUE 33.37s     OT LONG TERM GOAL #3   Title Pt will improve grip strength in LUE to 25 lbs or greater for increasing overall functional use of LUE.    Baseline LUE 13.8 RUE 44.7    Time 8    Period Weeks    Status On-going   10.5 lbs with  LUE 04/01/21     OT LONG TERM GOAL #4   Title Pt will improve standing tolerance to 20 minutes or greater in order to increase ability to complete home management tasks with improved activity tolerance  Baseline 10 minutes before requiring break    Time 8    Period Weeks    Status Achieved   pt reports doing these tasks for 20 minutes or greater at home                Plan - 04/01/21 1201    Clinical Impression Statement Pt has met 3/4 STGs and 1/4 LTGs and is progressing towards remaining goals. Pt continues to have dcereased grip strength and coordination in LUE impeding overall independence.    OT Occupational Profile and History Problem Focused Assessment - Including review of records relating to presenting problem    Occupational performance deficits (Please refer to evaluation for details): ADL's;IADL's;Leisure    Body Structure / Function / Physical Skills ADL;IADL;Coordination;FMC;ROM;UE functional use;GMC;Vision;Strength;Dexterity;Decreased knowledge of use of DME;Balance;Pain    Rehab Potential Good    Clinical Decision Making Limited treatment options, no task modification necessary    Comorbidities Affecting Occupational Performance: None    Modification or Assistance to Complete Evaluation  No modification of tasks or assist necessary to complete eval    OT Frequency 1x / week    OT Duration 8 weeks   8 visits + eval   OT Treatment/Interventions Self-care/ADL training;Cryotherapy;Fluidtherapy;DME and/or AE instruction;Passive range of motion;Manual Therapy;Functional Mobility Training;Neuromuscular education;Moist Heat;Therapeutic exercise;Patient/family education;Therapeutic activities;Visual/perceptual remediation/compensation;Energy conservation;Paraffin    Plan continue checking remaining goals. plan to discharge next session.    Consulted and Agree with Plan of Care Patient           Patient will benefit from skilled therapeutic intervention in order to improve  the following deficits and impairments:   Body Structure / Function / Physical Skills: ADL,IADL,Coordination,FMC,ROM,UE functional use,GMC,Vision,Strength,Dexterity,Decreased knowledge of use of DME,Balance,Pain       Visit Diagnosis: Unsteadiness on feet  Difficulty in walking, not elsewhere classified  Muscle weakness (generalized)  Stiffness of left shoulder, not elsewhere classified  Other lack of coordination  Visuospatial deficit    Problem List Patient Active Problem List   Diagnosis Date Noted  . Febrile illness, acute 01/03/2021  . Slow transit constipation   . Sore throat   . Irregular cardiac rhythm   . PAF (paroxysmal atrial fibrillation) ([place])   . Labile blood pressure   . Hypokalemia   . Labile blood glucose   . Poorly controlled type 2 diabetes mellitus with peripheral neuropathy ([place])   . Stage 3b chronic kidney disease ([place])   . Right pontine cerebrovascular accident ([place]) 12/27/2020  . Acute CVA (cerebrovascular accident) ([place]) 12/24/2020  . Hyperlipidemia 10/24/2020  . Vitamin D deficiency 10/24/2020  . Fibroids 07/21/2020  . S/P TAH (total abdominal hysterectomy) 07/21/2020  . Chronic a-fib ([place]) 05/23/2020  . Hydroureter on right 05/23/2020  . Microalbuminuria 05/13/2020  . Non-ischemic cardiomyopathy ([place])   . Normocytic anemia   . Nephrolithiasis 02/14/2020  . Paroxysmal atrial fibrillation ([place]) 12/18/2019  . 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 Gross hematuria 12/18/2019  . Staghorn renal calculus 12/18/2019  . Umbilical hernia without obstruction and without gangrene 12/18/2019  . Uterine fibroid 10/25/2019  . Right hip pain   . Type 2 diabetes mellitus ([place])   . Essential hypertension   . CHF (congestive heart failure) ([place]) 10/21/2019  . Atrial fibrillation with RVR ([place]) 10/21/2019  . Atrial fibrillation with rapid ventricular response ([place]) 10/20/2019    Zachery Conch MOT, OTR/L  04/01/2021, 12:25 PM  [facility] [address] [place], [place], [address] Phone: [phone]  Fax:  [phone]  Name: Adrienne Lane MRN: [id number] Date of Birth: [birth year]

## 2021-04-07 ENCOUNTER — Other Ambulatory Visit: Payer: Self-pay

## 2021-04-07 ENCOUNTER — Telehealth: Payer: Self-pay | Admitting: Internal Medicine

## 2021-04-07 DIAGNOSIS — Z794 Long term (current) use of insulin: Secondary | ICD-10-CM

## 2021-04-07 DIAGNOSIS — E1165 Type 2 diabetes mellitus with hyperglycemia: Secondary | ICD-10-CM

## 2021-04-07 MED ORDER — "INSULIN SYRINGE-NEEDLE U-100 30G X 5/16"" 0.3 ML MISC"
1 refills | Status: DC
  Filled 2021-04-07: qty 90, fill #0
  Filled 2021-08-04: qty 100, 30d supply, fill #0
  Filled 2021-12-07 – 2021-12-23 (×2): qty 100, 30d supply, fill #1
  Filled 2021-12-24: qty 100, 30d supply, fill #0
  Filled 2021-12-28: qty 80, 22d supply, fill #0

## 2021-04-07 MED FILL — Mexiletine HCl Cap 150 MG: ORAL | 30 days supply | Qty: 120 | Fill #0 | Status: AC

## 2021-04-07 NOTE — Telephone Encounter (Signed)
Pt called needing a syringe for insulin glargine (LANTUS) 100 UNIT/ML injection [[id number]]. Pt states she needs a syringe ASAP. Please follow up with pt regarding her concerns.

## 2021-04-07 NOTE — Progress Notes (Signed)
insulin syringe refilled

## 2021-04-08 ENCOUNTER — Ambulatory Visit: Payer: Self-pay | Admitting: Occupational Therapy

## 2021-04-08 ENCOUNTER — Other Ambulatory Visit: Payer: Self-pay

## 2021-04-08 ENCOUNTER — Other Ambulatory Visit: Payer: Self-pay | Admitting: Internal Medicine

## 2021-04-08 ENCOUNTER — Encounter: Payer: Self-pay | Admitting: Occupational Therapy

## 2021-04-08 ENCOUNTER — Ambulatory Visit: Payer: Self-pay

## 2021-04-08 DIAGNOSIS — R2681 Unsteadiness on feet: Secondary | ICD-10-CM

## 2021-04-08 DIAGNOSIS — M6281 Muscle weakness (generalized): Secondary | ICD-10-CM

## 2021-04-08 DIAGNOSIS — R41842 Visuospatial deficit: Secondary | ICD-10-CM

## 2021-04-08 DIAGNOSIS — M25512 Pain in left shoulder: Secondary | ICD-10-CM

## 2021-04-08 DIAGNOSIS — R278 Other lack of coordination: Secondary | ICD-10-CM

## 2021-04-08 DIAGNOSIS — R2689 Other abnormalities of gait and mobility: Secondary | ICD-10-CM

## 2021-04-08 DIAGNOSIS — M25612 Stiffness of left shoulder, not elsewhere classified: Secondary | ICD-10-CM

## 2021-04-08 MED ORDER — "ULTICARE INSULIN SYRINGE 30G X 1/2"" 0.3 ML MISC"
0 refills | Status: DC
  Filled 2021-04-08: qty 90, fill #0

## 2021-04-08 NOTE — Therapy (Signed)
[facility] [address] [place], [place], [address] Phone: [phone]   Fax:  [phone]  Occupational Therapy Treatment & Discharge  Patient Details  Name: Adrienne Lane MRN: [id number] Date of Birth: [birth year] Referring Provider (OT): Lala Lund, MD   Encounter Date: 04/08/2021   OT End of Session - 04/08/21 1149    Visit Number 8    Number of Visits 9    Date for OT Re-Evaluation 29/92/42   cert 90 days - 6/83/4196   Authorization Type Medicaid Pending - self pay currently    OT Start Time 1147    OT Stop Time 1230    OT Time Calculation (min) 43 min    Activity Tolerance Patient tolerated treatment well    Behavior During Therapy [facility] for tasks assessed/performed           Past Medical History:  Diagnosis Date  . Atrial fibrillation with RVR ([place])   . Bigeminy 01/2020  . CHF (congestive heart failure) ([place]) 10/20/2019  . CKD (chronic kidney disease), stage IV ([place])   . Diabetes mellitus without complication ([place])   . DOE (dyspnea on exertion)    walking upstairs or up hill resolves in one minute  . Fibroids   . History of kidney stones   . History of recent blood transfusion 02/26/2020  . Hypertension   . Iron deficiency anemia   . Non-ischemic cardiomyopathy (HCC)    tachycardia induced  . Obese   . Peripheral edema   . Premature ventricular contractions (PVCs) (VPCs)   . Umbilical hernia   . Wears glasses     Past Surgical History:  Procedure Laterality Date  . CHOLECYSTECTOMY    . CYSTOSCOPY W/ URETERAL STENT PLACEMENT Bilateral 05/24/2020   Procedure: CYSTOSCOPY WITH RETROGRADE PYELOGRAM/URETERAL STENT PLACEMENT;  Surgeon: Robley Fries, MD;  [facility];  Service: Urology;  Laterality: Bilateral;  . CYSTOSCOPY/RETROGRADE/URETEROSCOPY Bilateral 04/03/2020   Procedure: CYSTOSCOPY/RETROGRADE/URETEROSCOPY;  Surgeon: Ardis Hughs, MD;  [facility];  Service: Urology;   Laterality: Bilateral;  . HYSTERECTOMY ABDOMINAL WITH SALPINGO-OOPHORECTOMY  07/21/2020   Procedure: HYSTERECTOMY ABDOMINAL WITH BILATERAL SALPINGO-OOPHORECTOMY;  Surgeon: Sanjuana Kava, MD;  Location: [facility];  Service: Gynecology;;  . IR FLUORO GUIDE CV LINE RIGHT  02/21/2020  . IR US GUIDE VASC ACCESS RIGHT  02/21/2020  . NEPHROLITHOTOMY Left 02/14/2020   Procedure: NEPHROLITHOTOMY PERCUTANEOUS/ SURGEON ACCESS/ LEFT PERCUTANEOUS NEPHROSTOMY TUBE PLACEMENT;  Surgeon: Ardis Hughs, MD;  [facility];  Service: Urology;  Laterality: Left;  . NEPHROLITHOTOMY Left 02/21/2020   Procedure: NEPHROLITHOTOMY PERCUTANEOUS SECOND LOOK;  Surgeon: Ardis Hughs, MD;  [facility];  Service: Urology;  Laterality: Left;  . NEPHROLITHOTOMY Left 02/26/2020   Procedure: NEPHROLITHOTOMY PERCUTANEOUS;  Surgeon: Ceasar Mons, MD;  [facility];  Service: Urology;  Laterality: Left;  NEED 150 MIN  . NEPHROLITHOTOMY Right 03/24/2020   Procedure: NEPHROLITHOTOMY PERCUTANEOUS WITH ACCESS LEFT STENT REMOVAL;  Surgeon: Ardis Hughs, MD;  [facility];  Service: Urology;  Laterality: Right;  . RIGHT HEART CATH N/A 11/09/2019   Procedure: RIGHT HEART CATH;  Surgeon: Jolaine Artist, MD;  Location: [facility];  Service: Cardiovascular;  Laterality: N/A;  . WISDOM TOOTH EXTRACTION      There were no vitals filed for this visit.   Subjective Assessment - 04/08/21 1149    Subjective  "I don't feel like I need to come back for PT discharge. I thought I had  PT today but I got here at 11:02 and I've been waiting this whole time for you"    Pertinent History R [place] CVA, DM, HTN, CKD, CHF    Limitations fall risk. not driving.    Patient Stated Goals get stronger and get back to normal    Currently in Pain? No/denies             OCCUPATIONAL THERAPY DISCHARGE SUMMARY  Visits from Start of Care: 8  Current functional level related to goals / functional outcomes: Pt has  met 3/4 STGs and 2/4 LTGs towards goals. Pt continues to have decreased grip strength and coordination in LUE impeding overall independence.   Remaining deficits: Continues with decreased grip strength and coordination in LUE    Education / Equipment: HEPs for coordination and grip strengthening - yellow and red theraputty Plan: Patient agrees to discharge.  Patient goals were partially met. Patient is being discharged due to meeting the stated rehab goals.  ?????             Hand Gripper: with LUE on level 1 with black spring. Pt picked up 1 inch blocks with gripper with moderate drops and mod difficulty. Downgraded to gold spring approx 50% through.                   OT Short Term Goals - 04/08/21 1152      OT SHORT TERM GOAL #1   Title Pt will be independent with coordination HEP for LUE 03/02/2021    Baseline not issued yet    Time 4    Period Weeks    Status Achieved    Target Date 03/02/21      OT SHORT TERM GOAL #2   Title Pt will demonstrate improved LUE functional use by improving Box and Blocks score to 42 or greater.    Baseline RUE 52 LUE 36    Time 4    Period Weeks    Status Achieved   LUE 46 blocks 04/01/21     OT SHORT TERM GOAL #3   Title Pt will improve grip strength in LUE to 18 lbs or greater for increase for clothing management and overall functional use.    Baseline RUE 44.7 LUE 13.8    Time 4    Period Weeks    Status Not Met   9.4 lbs in LUE 04/08/21     OT SHORT TERM GOAL #4   Title Pt will report increased speed and accuracy with basic ADLs (i.e. tying shoes, washing up, etc)    Time 4    Period Weeks    Status Achieved   pt reports "its a little better" 02/23/21            OT Long Term Goals - 04/08/21 1153      OT LONG TERM GOAL #1   Title Pt will be independent with updated HEP for LUE strengthening and coordination    Baseline not issued yet    Time 8    Period Weeks    Status Achieved      OT LONG TERM GOAL #2    Title Pt will improve LUE fine motor coordination by completing 9 hole peg test with LUE in 50 seconds or less *REVISED: 30 seconds or less with LUE    Baseline LUE 68.37, RUE 24.5    Time 8    Period Weeks    Status Not Met   04/08/21 LUE 32.25s  OT LONG TERM GOAL #3   Title Pt will improve grip strength in LUE to 25 lbs or greater for increasing overall functional use of LUE.    Baseline LUE 13.8 RUE 44.7    Time 8    Period Weeks    Status Not Met   9.4 in LUE 04/08/21     OT LONG TERM GOAL #4   Title Pt will improve standing tolerance to 20 minutes or greater in order to increase ability to complete home management tasks with improved activity tolerance    Baseline 10 minutes before requiring break    Time 8    Period Weeks    Status Achieved   pt reports doing these tasks for 20 minutes or greater at home                Plan - 04/08/21 1158    Clinical Impression Statement Pt has met 3/4 STGs and 2/4 LTGs towards goals. Pt continues to have decreased grip strength and coordination in LUE impeding overall independence. Pt has not met updated 9 hole peg test goal and has not met grip strength goal. Pt has decreased grip strength in LUE since evaluation. Encouraged to use theraputty on a regular basis and issued red theraputty for graduating/upgrading as strength gets better in LUE. Pt verbalized understanding.    OT Occupational Profile and History Problem Focused Assessment - Including review of records relating to presenting problem    Occupational performance deficits (Please refer to evaluation for details): ADL's;IADL's;Leisure    Body Structure / Function / Physical Skills ADL;IADL;Coordination;FMC;ROM;UE functional use;GMC;Vision;Strength;Dexterity;Decreased knowledge of use of DME;Balance;Pain    Rehab Potential Good    Clinical Decision Making Limited treatment options, no task modification necessary    Comorbidities Affecting Occupational Performance: None     Modification or Assistance to Complete Evaluation  No modification of tasks or assist necessary to complete eval    OT Frequency 1x / week    OT Duration 8 weeks   8 visits + eval   OT Treatment/Interventions Self-care/ADL training;Cryotherapy;Fluidtherapy;DME and/or AE instruction;Passive range of motion;Manual Therapy;Functional Mobility Training;Neuromuscular education;Moist Heat;Therapeutic exercise;Patient/family education;Therapeutic activities;Visual/perceptual remediation/compensation;Energy conservation;Paraffin    Plan OT d/c    Consulted and Agree with Plan of Care Patient           Patient will benefit from skilled therapeutic intervention in order to improve the following deficits and impairments:   Body Structure / Function / Physical Skills: ADL,IADL,Coordination,FMC,ROM,UE functional use,GMC,Vision,Strength,Dexterity,Decreased knowledge of use of DME,Balance,Pain       Visit Diagnosis: Unsteadiness on feet  Muscle weakness (generalized)  Other abnormalities of gait and mobility  Stiffness of left shoulder, not elsewhere classified  Other lack of coordination  Visuospatial deficit  Acute pain of left shoulder    Problem List Patient Active Problem List   Diagnosis Date Noted  . Febrile illness, acute 01/03/2021  . Slow transit constipation   . Sore throat   . Irregular cardiac rhythm   . PAF (paroxysmal atrial fibrillation) ([place])   . Labile blood pressure   . Hypokalemia   . Labile blood glucose   . Poorly controlled type 2 diabetes mellitus with peripheral neuropathy ([place])   . Stage 3b chronic kidney disease ([place])   . Right pontine cerebrovascular accident ([place]) 12/27/2020  . Acute CVA (cerebrovascular accident) ([place]) 12/24/2020  . Hyperlipidemia 10/24/2020  . Vitamin D deficiency 10/24/2020  . Fibroids 07/21/2020  . S/P TAH (total abdominal hysterectomy) 07/21/2020  . Chronic a-fib (  [place]) 05/23/2020  . Hydroureter on right 05/23/2020  .  Microalbuminuria 05/13/2020  . Non-ischemic cardiomyopathy ([place])   . Normocytic anemia   . Nephrolithiasis 02/14/2020  . Paroxysmal atrial fibrillation ([place]) 12/18/2019  . 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 Gross hematuria 12/18/2019  . Staghorn renal calculus 12/18/2019  . Umbilical hernia without obstruction and without gangrene 12/18/2019  . Uterine fibroid 10/25/2019  . Right hip pain   . Type 2 diabetes mellitus ([place])   . Essential hypertension   . CHF (congestive heart failure) ([place]) 10/21/2019  . Atrial fibrillation with RVR ([place]) 10/21/2019  . Atrial fibrillation with rapid ventricular response [facility]) 10/20/2019    Zachery Conch MOT, OTR/L  04/08/2021, 12:28 PM  [facility] [address] [place], [place], [address] Phone: [phone]   Fax:  [phone]  Name: Adrienne Lane MRN: [id number] Date of Birth: 02-04-1966

## 2021-04-09 ENCOUNTER — Other Ambulatory Visit: Payer: Self-pay

## 2021-04-09 ENCOUNTER — Other Ambulatory Visit: Payer: Self-pay | Admitting: Physician Assistant

## 2021-04-09 DIAGNOSIS — Z794 Long term (current) use of insulin: Secondary | ICD-10-CM

## 2021-04-09 DIAGNOSIS — E1165 Type 2 diabetes mellitus with hyperglycemia: Secondary | ICD-10-CM

## 2021-04-09 MED ORDER — "ULTICARE INSULIN SYRINGE 30G X 1/2"" 0.3 ML MISC": 0 refills | Status: DC

## 2021-04-09 MED ORDER — "INSULIN SYRINGE-NEEDLE U-100 30G X 5/16"" 0.5 ML MISC"
1.0000 | Freq: Every day | 11 refills | Status: AC
  Filled 2021-04-09: qty 100, 100d supply, fill #0
  Filled 2021-08-25: qty 100, 100d supply, fill #1
  Filled 2021-12-24: qty 100, 100d supply, fill #0

## 2021-04-09 NOTE — Progress Notes (Unsigned)
[facility]

## 2021-04-09 NOTE — Progress Notes (Signed)
[facility] syringe needles reordered printed for pharmacy

## 2021-04-10 NOTE — Therapy (Signed)
[facility] [address] [place], [place], [address] Phone: [phone]   Fax:  [phone]  Patient Details  Name: Adrienne Lane MRN: [id number] Date of Birth: [birth year] Referring Provider:  No ref. provider found  Encounter Date: 04/10/2021  PHYSICAL THERAPY NON VISIT - DISCHARGE SUMMARY  Visits from Start of Care: 7  Current functional level related to goals / functional outcomes: Patient was expecting to be d/c from PT services on 4/20, Primary PT Out of Office therefore visit cancelled. Patient requesting at OT appointment to be d/c from PT services and not reschedule last therapy session. Patient demonstrated significant progress with PT services and able to meet LTG that were assessed.    Remaining deficits: Low Fall Risk, Mild Weakness   Education / Equipment: HEP Provided  Plan: Patient agrees to discharge.  Patient goals were partially met. Patient is being discharged due to the patient's request.  See above, patient requesting d/c. ?????         PT Short Term Goals - 03/12/21 2152      PT SHORT TERM GOAL #1   Title Patient will be independent with initial HEP for balance/strengthening (STGs Due at 5th Visit)    Baseline 03/11/21: met with current HEP, advanced HEP this session    Status Achieved      PT SHORT TERM GOAL #2   Title Patient will improve 5x sit <> stand to </= 20 seconds to demonstrate improved balance    Baseline 03/11/21: 19.69 sec's with UE support from standard height chair    Status Achieved      PT SHORT TERM GOAL #3   Title Patient will imrpove Berg Balance to >/= 46/56 to demonstrate reduced fall risk and improved balance    Baseline 03/11/21: 48/56 scored this session    Status Achieved      PT SHORT TERM GOAL #4   Title Patient will demo ability to ambulate >/= 500 ft with supervision without AD to demonstrate improved household mobility    Baseline 03/11/21: met distance  portion of goal with supervision to min guard assist no AD    Status Partially Met           PT Long Term Goals - 04/01/21 1114      PT LONG TERM GOAL #1   Title Patient will be independent with Final HEP for strengthening/balance (ALL LTGs Due at 9th Visit)    Baseline no HEP established    Time 8    Period --   visits   Status New      PT LONG TERM GOAL #2   Title Patient will improve TUG to </= 10 seconds to demo improved balance and reduced fall risk    Baseline 12.25 secs    Time 8    Period --   visits   Status New      PT LONG TERM GOAL #3   Title Patient will improve Berg Balance to >/= 50/56 to demonstrate decreased fall risk and improved balance    Baseline 43/56    Time 8    Period --   visits   Status New      PT LONG TERM GOAL #4   Title Patient will improve gait speed to >/= 2.75 ft/sec to demonstrate improved community ambulation    Baseline 2.42 ft/sec    Time 8    Period --   visits   Status New      PT  LONG TERM GOAL #5   Title Patient will improve 5x sit <> stand to </= 15 seconds to demonstrate reduce fall risk and improved balance    Baseline 24.72 secs    Time 8    Period --   visits   Status New      PT LONG TERM GOAL #6   Title Patient will demo abilityto ambulate >/= 500 ft on various outdoor surfaces for improved community mobility    Baseline 600 ft outdoors various surfaces without AD, Supervision    Time 8    Period --   visits   Status Achieved              Jones Bales, PT, DPT 04/10/2021, 9:22 AM  [facility] [address] [place], [place], [address] Phone: [phone]   Fax:  [phone]

## 2021-04-13 ENCOUNTER — Telehealth: Payer: Self-pay | Admitting: Internal Medicine

## 2021-04-13 NOTE — Telephone Encounter (Signed)
Pt called in stating she needs the following medicaitions refilled  isosorbide mononitrate (IMDUR) 60 MG 24 hr tablet  carvedilol (COREG) 6.25 MG tablet  amLODipine (NORVASC) 10 MG tablet hydrALAZINE (APRESOLINE) 100 MG tablet      Pharmacy  [facility] [address], [place] [address]  Phone:  [phone] Fax:  [phone]

## 2021-04-13 NOTE — Telephone Encounter (Signed)
Left message on voicemail to f/u with Cardiology to refill rx.  Pls send rf if applicable.

## 2021-04-13 NOTE — Telephone Encounter (Signed)
Patient called back office and was advised to f/u with Cardiology. Patient verbalized understanding.

## 2021-04-14 ENCOUNTER — Other Ambulatory Visit (HOSPITAL_COMMUNITY): Payer: Self-pay | Admitting: *Deleted

## 2021-04-15 ENCOUNTER — Other Ambulatory Visit: Payer: Self-pay

## 2021-04-15 MED FILL — Carvedilol Tab 6.25 MG: ORAL | 30 days supply | Qty: 60 | Fill #0 | Status: AC

## 2021-04-15 MED FILL — Hydralazine HCl Tab 100 MG: ORAL | 30 days supply | Qty: 90 | Fill #0 | Status: AC

## 2021-04-15 MED FILL — Isosorbide Mononitrate Tab ER 24HR 60 MG: ORAL | 30 days supply | Qty: 30 | Fill #0 | Status: AC

## 2021-04-15 MED FILL — Amlodipine Besylate Tab 10 MG (Base Equivalent): ORAL | 30 days supply | Qty: 30 | Fill #0 | Status: AC

## 2021-04-21 NOTE — Progress Notes (Signed)
PCP: Dr Wynetta Emery  Primary Cardiologist: Dr Gardiner Rhyme HF MD: Dr [name] Nephorology: Dr Joelyn Oms   HPI: Adrienne Lane is a 55 y/o woman with morbid obesity, HTN, DM2, PAF, PVCs, CKD3b and chronic systolic HF.   Admitted to [facility] 10/20 after a mechanical fall. ECHO EF 20-25% with severe RV dysfunction as well. 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 Renal function worsened. CT Renal Stone showed bilateral staghorn calculi without hydronephrosis and severe anasarca. Also found to have uterine mass felt to be large fibroid. Diuresed 90 pounds. Placed on carvedilol and mexiletine due to high PVC burden. Discharged on 11/12/19 with weight 255 pounds.   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 Saw Dr. Lovena Le who was considering PVC ablation. Doubted mexilitene is helping much.  Admitted 6/21 with AKI on CKD. CT scan of the abdomen which showed bilateral hydronephrosis and large fibroid uterus. Creatinine on presentation was 3.24. Bilateral ureteral stent placement with bilateral retrograde pyelography. Creatinine improved.   07/2020 underwent total abdominal hysterectomy and bilateral salpingoopherectomy  12/2020 admitted for acute CVA. Presented w/ facial droop and blurry vision. She presented outside of window for TPA, it was not given. Seen by neurology/stroke team.  Completed work-up for CVA. MRI brain showed acute infarct in the right hemipons, additional small acute infarct in the left frontal subcortical white matter. Mild associated edema in the right pons without substantial mass-effect. Echo EF 45-50%. Eliquis was switched to Pradaxa. She was discharged to CIR. While in CIR she developed upper respiratory symptoms and tested + for COVID and was readmitted to [facility] under [name].   Here for f/u. Says she is doing much better still with some left-sided weakness mostly in left hand. Deneis CP or SOB. Able to ADLs  without much difficulty. No CP. Mild DOE - with stairs and hills. Edema under good control     Studies:   Echo 1/22: LVEF 45-50%. RV mildly reduced Echo 2/21 EF 40-45% hard to assess with PVCs. (read formally as 50-55%) RV mildly down No effusion.  ECHO 10/21/2019. EF 20-25%, pericardial effusion, mild LVH, RA/LA dilated. RV down.   [place] 11/12/19  RA = 11 RV = 47/15 PA = 49/17 (32) PCW = 20 Fick cardiac output/index = 8.8/3.8 PVR = 1.4 WU FA sat = 97% PA sat = 70%, 72% High SVC = 68%  ROS: All systems negative except as listed in HPI, PMH and Problem List.  SH:  Social History   Socioeconomic History  . Marital status: Married    Spouse name: Not on file  . Number of children: 0  . Years of education: Not on file  . Highest education level: Associate degree: occupational, Hotel manager, or vocational program  Occupational History  . Occupation: unemployed  Tobacco Use  . Smoking status: Never Smoker  . Smokeless tobacco: Never Used  Vaping Use  . Vaping Use: Never used  Substance and Sexual Activity  . Alcohol use: Never  . Drug use: Never  . Sexual activity: Yes    Birth control/protection: None  Other Topics Concern  . Not on file  Social History Narrative  . Not on file   Social Determinants of Health   Financial Resource Strain: Not on file  Food Insecurity: Not on file  Transportation Needs: No Transportation Needs  . Lack of Transportation (Medical): No  . Lack of Transportation (Non-Medical): No  Physical Activity: Not on file  Stress: Not on  file  Social Connections: Not on file  Intimate Partner Violence: Not on file    FH:  Family History  Problem Relation Age of Onset  . Atrial fibrillation Mother   . Hypertension Mother   . Stroke Mother 8  . Diabetes Mother   . Diabetes Father   . Hypertension Father   . Diabetes Sister   . Hypertension Sister   . Diabetes Brother   . Hypertension Brother   . Diabetes Brother   . Heart attack  Brother   . Stroke Maternal Grandmother 63    Past Medical History:  Diagnosis Date  . Atrial fibrillation with RVR ([place])   . Bigeminy 01/2020  . CHF (congestive heart failure) ([place]) 10/20/2019  . CKD (chronic kidney disease), stage IV ([place])   . Diabetes mellitus without complication ([place])   . DOE (dyspnea on exertion)    walking upstairs or up hill resolves in one minute  . Fibroids   . History of kidney stones   . History of recent blood transfusion 02/26/2020  . Hypertension   . Iron deficiency anemia   . Non-ischemic cardiomyopathy (HCC)    tachycardia induced  . Obese   . Peripheral edema   . Premature ventricular contractions (PVCs) (VPCs)   . Stroke ([place]) 12/2020  . Umbilical hernia   . Wears glasses     Current Outpatient Medications  Medication Sig Dispense Refill  . amLODipine (NORVASC) 10 MG tablet TAKE 1 TABLET (10 MG TOTAL) BY MOUTH DAILY. 30 tablet 6  . aspirin 81 MG chewable tablet Chew 1 tablet (81 mg total) by mouth daily. 360 tablet 0  . atorvastatin (LIPITOR) 80 MG tablet TAKE 1 TABLET (80 MG TOTAL) BY MOUTH EVERY EVENING. 90 tablet 0  . carvedilol (COREG) 6.25 MG tablet TAKE 1 TABLET (6.25 MG TOTAL) BY MOUTH 2 (TWO) TIMES DAILY WITH A MEAL. 60 tablet 6  . cholecalciferol (VITAMIN D3) 25 MCG (1000 UNIT) tablet Take 1,000 Units by mouth daily.    . Coenzyme Q10 (Q-10 CO-ENZYME PO) Take 1 capsule by mouth daily in the afternoon.    . dabigatran (PRADAXA) 150 MG CAPS capsule TAKE 1 CAPSULE (150 MG TOTAL) BY MOUTH EVERY 12 HOURS. 60 capsule 3  . dapagliflozin propanediol (FARXIGA) 10 MG TABS tablet Take 1 tablet (10 mg total) by mouth daily before breakfast. 30 tablet 6  . glucose blood (TRUE METRIX BLOOD GLUCOSE TEST) test strip Use as instructed 100 each 2  . hydrALAZINE (APRESOLINE) 100 MG tablet TAKE 1 TABLET (100 MG TOTAL) BY MOUTH 3 (THREE) TIMES DAILY. 90 tablet 11  . insulin aspart (NOVOLOG) 100 UNIT/ML injection USE AS DIRECTED PER SLIDING SCALE. INJECT  2-10 UNITS 3 TIMES DAILY INTO THE SKIN 10 mL 0  . insulin glargine (LANTUS) 100 UNIT/ML injection INJECT 0.2 MLS (20 UNITS TOTAL) INTO THE SKIN EVERY MORNING. 10 mL 2  . Insulin Syringe-Needle U-100 (TRUEPLUS INSULIN SYRINGE) 30G X 5/16" 0.3 ML MISC USE AS DIRECTED 3 TIMES DAILY. 90 each 1  . Insulin Syringe-Needle U-100 30G X 5/16" 0.5 ML MISC Use as directed. 100 each 11  . isosorbide mononitrate (IMDUR) 60 MG 24 hr tablet TAKE 1 TABLET (60 MG TOTAL) BY MOUTH DAILY. 30 tablet 6  . Lancets (ONETOUCH ULTRASOFT) lancets Check CBG twice a day 60 each 5  . mexiletine (MEXITIL) 150 MG capsule TAKE 2 CAPSULES (300 MG TOTAL) BY MOUTH EVERY 12 (TWELVE) HOURS. 120 capsule 6  . Multiple Vitamins-Minerals (WOMENS MULTI PO)  Take 1 tablet by mouth daily.    . potassium chloride (KLOR-CON) 10 MEQ tablet Take 1 tablet (10 mEq total) by mouth daily. 30 tablet 6  . TRUEplus Lancets 28G MISC CHECK BLOOD GLUCOSE TWICE A DAY 60 each 5   No current facility-administered medications for this encounter.    Vitals:   04/22/21 1128  BP: (!) 146/80  Pulse: 61  SpO2: 98%  Weight: 104.8 kg (231 lb)   Wt Readings from Last 3 Encounters:  04/22/21 104.8 kg (231 lb)  03/17/21 103.3 kg (227 lb 12.8 oz)  03/03/21 106.1 kg (234 lb)    PHYSICAL EXAM: General:  Well appearing. No resp difficulty HEENT: normal Neck: supple. no JVD. Carotids 2+ bilat; no bruits. No lymphadenopathy or thryomegaly appreciated. Cor: PMI nondisplaced. Mildly irregular rate & rhythm. No rubs, gallops or murmurs. Lungs: clear Abdomen: obese soft, nontender, nondistended. No hepatosplenomegaly. No bruits or masses. Good bowel sounds. Extremities: no cyanosis, clubbing, rash, edema Neuro: alert & orientedx3, cranial nerves grossly intact. moves all 4 extremities w/o difficulty.Mildly weak on left  Affect pleasant   EKG: SR 87 with LVH and frequent PVCs Personally reviewed   ASSESSMENT & PLAN: 1. Chronic  Systolic Heart Failure ECHO  10/2019  EF 20-25%, pericardial effusion, mild LVH, RA/LA dilated.  RV . Possible Tachy Mediated cardiomyopathy verus PVC. She has not had LHC due to elevated creatinine. Had [place] 11/23 with preserved cardiac output.  - Echo 2/21 EF 40-45% (Hard to assess with PVCs) RV mild HK. No effusion Personally reviewed - Echo 1/22: EF 45-50%. RV mildly reduced  - Stable NYHA  II-early III - Continue carvedilol 6.25 mg bid - C/w Farxiga 10 mg daily  - Continue hydralazine 100 mg three times a day + imdur 60 mg daily.  - Continue amlodipine 10 mg daily - Has been off ARNI and spiro due to CKD but SCR has stabilized 1.5-1.9 range - Add Entresto 24/26 bid. Check BMET today and 2 weeks    2. Frequent PVCs  - Failed amio - On mexilitene 300 mg bid - Zio Patch 11/2019 Frequent PVCs (19.0%, 42278) with two predominant morphologies (9.8 & 9.3%, respectively).  - Zio 4/22 740 runs SVT, PVCs 21%. PACs 5.6% - Continues with very frequent PVCs but EF stable and relatively asymptomatic. Will arrange f/u with Dr. Lovena Le to discuss next steps. Continue mexilitene for now.    3. PAF  - Maintaining NSR w/ frequent PVCs on EKG  - Continue carvedilol 6.25 mg bid  - Had CVA w/ Eliquis, now on Pradaxa   4. CKD 3b - recent creatinine baseline SCr ~1.9 - Follows with Nephrology - Check BMP  - Continue SGLT2i. Add ARNI   5. HTN  - BP up. Adding ARNI   7. Type 2 DM - Continue Farxiga  8. Uterine Fibroids  -s/p total abdominal hysterectomy and bilateral salpingoopherectomy  9. Possible OSA with snoring - needs sleep study - complicated by lack of health insurance - can order when she gets Medicaid (currenlty pending coverage)   10. CVA - recent CVA 1/21 in setting of Afib - Failed Eliquis, now on Pradaxa  - much improved     [name] MD  11:42 AM

## 2021-04-22 ENCOUNTER — Other Ambulatory Visit: Payer: Self-pay

## 2021-04-22 ENCOUNTER — Encounter (HOSPITAL_COMMUNITY): Payer: Self-pay | Admitting: Internal Medicine

## 2021-04-22 ENCOUNTER — Other Ambulatory Visit: Payer: Self-pay | Admitting: Internal Medicine

## 2021-04-22 ENCOUNTER — Ambulatory Visit (HOSPITAL_COMMUNITY)
Admission: RE | Admit: 2021-04-22 | Discharge: 2021-04-22 | Disposition: A | Discharge status: Home / Self Care | Payer: Self-pay | Source: Ambulatory Visit | Attending: Internal Medicine | Admitting: Internal Medicine

## 2021-04-22 VITALS — BP 146/80 | HR 61 | Wt 231.0 lb

## 2021-04-22 DIAGNOSIS — I130 Hypertensive heart and chronic kidney disease with heart failure and stage 1 through stage 4 chronic kidney disease, or unspecified chronic kidney disease: Secondary | ICD-10-CM | POA: Insufficient documentation

## 2021-04-22 DIAGNOSIS — D259 Leiomyoma of uterus, unspecified: Secondary | ICD-10-CM | POA: Insufficient documentation

## 2021-04-22 DIAGNOSIS — Z79899 Other long term (current) drug therapy: Secondary | ICD-10-CM | POA: Insufficient documentation

## 2021-04-22 DIAGNOSIS — Z8249 Family history of ischemic heart disease and other diseases of the circulatory system: Secondary | ICD-10-CM | POA: Insufficient documentation

## 2021-04-22 DIAGNOSIS — N1832 Chronic kidney disease, stage 3b: Secondary | ICD-10-CM | POA: Insufficient documentation

## 2021-04-22 DIAGNOSIS — Z794 Long term (current) use of insulin: Secondary | ICD-10-CM | POA: Insufficient documentation

## 2021-04-22 DIAGNOSIS — I428 Other cardiomyopathies: Secondary | ICD-10-CM | POA: Insufficient documentation

## 2021-04-22 DIAGNOSIS — Z6835 Body mass index (BMI) 35.0-35.9, adult: Secondary | ICD-10-CM | POA: Insufficient documentation

## 2021-04-22 DIAGNOSIS — I480 Paroxysmal atrial fibrillation: Secondary | ICD-10-CM | POA: Insufficient documentation

## 2021-04-22 DIAGNOSIS — I482 Chronic atrial fibrillation, unspecified: Secondary | ICD-10-CM | POA: Insufficient documentation

## 2021-04-22 DIAGNOSIS — I5022 Chronic systolic (congestive) heart failure: Secondary | ICD-10-CM | POA: Insufficient documentation

## 2021-04-22 DIAGNOSIS — I6350 Cerebral infarction due to unspecified occlusion or stenosis of unspecified cerebral artery: Secondary | ICD-10-CM

## 2021-04-22 DIAGNOSIS — I493 Ventricular premature depolarization: Secondary | ICD-10-CM | POA: Insufficient documentation

## 2021-04-22 DIAGNOSIS — I313 Pericardial effusion (noninflammatory): Secondary | ICD-10-CM | POA: Insufficient documentation

## 2021-04-22 DIAGNOSIS — Z7901 Long term (current) use of anticoagulants: Secondary | ICD-10-CM | POA: Insufficient documentation

## 2021-04-22 DIAGNOSIS — I499 Cardiac arrhythmia, unspecified: Secondary | ICD-10-CM

## 2021-04-22 DIAGNOSIS — E1122 Type 2 diabetes mellitus with diabetic chronic kidney disease: Secondary | ICD-10-CM | POA: Insufficient documentation

## 2021-04-22 DIAGNOSIS — Z7982 Long term (current) use of aspirin: Secondary | ICD-10-CM | POA: Insufficient documentation

## 2021-04-22 LAB — CBC
HCT: 36.1 % (ref 36.0–46.0)
Hemoglobin: 11.7 g/dL — ABNORMAL LOW (ref 12.0–15.0)
MCH: 28.7 pg (ref 26.0–34.0)
MCHC: 32.4 g/dL (ref 30.0–36.0)
MCV: 88.7 fL (ref 80.0–100.0)
Platelets: 221 10*3/uL (ref 150–400)
RBC: 4.07 MIL/uL (ref 3.87–5.11)
RDW: 13.3 % (ref 11.5–15.5)
WBC: 5.5 10*3/uL (ref 4.0–10.5)
nRBC: 0 % (ref 0.0–0.2)

## 2021-04-22 LAB — COMPREHENSIVE METABOLIC PANEL
ALT: 21 U/L (ref 0–44)
AST: 25 U/L (ref 15–41)
Albumin: 3.3 g/dL — ABNORMAL LOW (ref 3.5–5.0)
Alkaline Phosphatase: 106 U/L (ref 38–126)
Anion gap: 7 (ref 5–15)
BUN: 22 mg/dL — ABNORMAL HIGH (ref 6–20)
CO2: 26 mmol/L (ref 22–32)
Calcium: 9.2 mg/dL (ref 8.9–10.3)
Chloride: 106 mmol/L (ref 98–111)
Creatinine, Ser: 1.77 mg/dL — ABNORMAL HIGH (ref 0.44–1.00)
GFR, Estimated: 34 mL/min — ABNORMAL LOW (ref >60)
Glucose, Bld: 179 mg/dL — ABNORMAL HIGH (ref 70–99)
Potassium: 3.1 mmol/L — ABNORMAL LOW (ref 3.5–5.1)
Sodium: 139 mmol/L (ref 135–145)
Total Bilirubin: 0.1 mg/dL — ABNORMAL LOW (ref 0.3–1.2)
Total Protein: 7.1 g/dL (ref 6.5–8.1)

## 2021-04-22 LAB — BRAIN NATRIURETIC PEPTIDE: B Natriuretic Peptide: 54.3 pg/mL (ref 0.0–100.0)

## 2021-04-22 MED ORDER — ENTRESTO 24-26 MG PO TABS
1.0000 | ORAL_TABLET | Freq: Two times a day (BID) | ORAL | 11 refills | Status: DC
  Filled 2021-04-22: qty 60, 30d supply, fill #0
  Filled 2021-06-03 – 2021-06-12 (×2): qty 60, 30d supply, fill #1
  Filled 2021-07-23: qty 60, 30d supply, fill #2
  Filled 2021-08-31: qty 60, 30d supply, fill #3

## 2021-04-22 MED ORDER — ATORVASTATIN CALCIUM 80 MG PO TABS
80.0000 mg | ORAL_TABLET | Freq: Every day | ORAL | 3 refills | Status: DC
  Filled 2021-04-22: qty 30, 30d supply, fill #0
  Filled 2021-06-03 – 2021-06-12 (×2): qty 30, 30d supply, fill #1
  Filled 2021-07-23: qty 30, 30d supply, fill #2

## 2021-04-22 NOTE — Patient Instructions (Addendum)
Labs done today. We will contact you only if your labs are abnormal.  A refill of Atorvastatin was sent into your pharmacy on file.   START Entresto 24-26mg  (1 tablet) by mouth 2 times daily.   No other medication changes were made. Please continue all current medications as prescribed.  Please follow up sith Dr. Lovena Le at [facility]. His office will contact you to schedule an appointment  Your physician recommends that you schedule a follow-up appointment in: 2 weeks for a lab only appointment and in 6 months with Dr. [name]. Please contact our office in October to schedule a November appointment.    If you have any questions or concerns before your next appointment please send [place] a message through [place] or call our office at [phone].    TO LEAVE A MESSAGE FOR THE NURSE SELECT OPTION 2, PLEASE LEAVE A MESSAGE INCLUDING: . YOUR NAME . DATE OF BIRTH . CALL BACK NUMBER . REASON FOR CALL**this is important as we prioritize the call backs  YOU WILL RECEIVE A CALL BACK THE SAME DAY AS LONG AS YOU CALL BEFORE 4:00 PM   Do the following things EVERYDAY: 1) Weigh yourself in the morning before breakfast. Write it down and keep it in a log. 2) Take your medicines as prescribed 3) Eat low salt foods--Limit salt (sodium) to 2000 mg per day.  4) Stay as active as you can everyday 5) Limit all fluids for the day to less than 2 liters   At the [facility], you and your health needs are our priority. As part of our continuing mission to provide you with exceptional heart care, we have created designated Provider Care Teams. These Care Teams include your primary Cardiologist (physician) and Advanced Practice Providers (APPs- Physician Assistants and Nurse Practitioners) who all work together to provide you with the care you need, when you need it.   You may see any of the following providers on your designated Care Team at your next follow up: Marland Kitchen Dr [name] . Dr Loralie Champagne . Darrick Grinder, NP . Lyda Jester, PA . Audry Riles, PharmD   Please be sure to bring in all your medications bottles to every appointment.

## 2021-04-23 ENCOUNTER — Other Ambulatory Visit: Payer: Self-pay

## 2021-05-05 ENCOUNTER — Encounter: Payer: Self-pay | Admitting: Internal Medicine

## 2021-05-05 ENCOUNTER — Ambulatory Visit (INDEPENDENT_AMBULATORY_CARE_PROVIDER_SITE_OTHER): Payer: Self-pay | Admitting: Internal Medicine

## 2021-05-05 ENCOUNTER — Other Ambulatory Visit: Payer: Self-pay

## 2021-05-05 VITALS — BP 138/64 | HR 64 | Ht 68.0 in | Wt 234.0 lb

## 2021-05-05 DIAGNOSIS — I493 Ventricular premature depolarization: Secondary | ICD-10-CM

## 2021-05-05 NOTE — Progress Notes (Signed)
HPI [name] returns today for followup. She is a pleasant 55 yo woman with a h/o PVC, non-ischemic CM, chronic renal failure, in part due to stones, stroke on Pradaxa. She is diabetic. She had previously been on amiodarone but did not appear to have much improvement though this was tried in the hospital when she was quite ill. She has worn cardiac monitors in the past with PVC burden of 12-21%. She does not feel her palpitations. Her EF has improved on medical therapy from 20 to most recently 45%. No syncope. She has had staghorn calculi treated by urology.  Allergies  Allergen Reactions  . Adhesive [Tape]     Tears skin, can tolerate paper tape     Current Outpatient Medications  Medication Sig Dispense Refill  . amLODipine (NORVASC) 10 MG tablet TAKE 1 TABLET (10 MG TOTAL) BY MOUTH DAILY. 30 tablet 6  . aspirin 81 MG chewable tablet Chew 1 tablet (81 mg total) by mouth daily. 360 tablet 0  . atorvastatin (LIPITOR) 80 MG tablet Take 1 tablet (80 mg total) by mouth daily. 90 tablet 3  . carvedilol (COREG) 6.25 MG tablet TAKE 1 TABLET (6.25 MG TOTAL) BY MOUTH 2 (TWO) TIMES DAILY WITH A MEAL. 60 tablet 6  . cholecalciferol (VITAMIN D3) 25 MCG (1000 UNIT) tablet Take 1,000 Units by mouth daily.    . Coenzyme Q10 (Q-10 CO-ENZYME PO) Take 1 capsule by mouth daily in the afternoon.    . dabigatran (PRADAXA) 150 MG CAPS capsule TAKE 1 CAPSULE (150 MG TOTAL) BY MOUTH EVERY 12 HOURS. 60 capsule 3  . dapagliflozin propanediol (FARXIGA) 10 MG TABS tablet Take 1 tablet (10 mg total) by mouth daily before breakfast. 30 tablet 6  . glucose blood (TRUE METRIX BLOOD GLUCOSE TEST) test strip Use as instructed 100 each 2  . hydrALAZINE (APRESOLINE) 100 MG tablet TAKE 1 TABLET (100 MG TOTAL) BY MOUTH 3 (THREE) TIMES DAILY. 90 tablet 11  . insulin aspart (NOVOLOG) 100 UNIT/ML injection USE AS DIRECTED PER SLIDING SCALE. INJECT 2-10 UNITS 3 TIMES DAILY INTO THE SKIN 10 mL 0  . insulin glargine  (LANTUS) 100 UNIT/ML injection INJECT 0.2 MLS (20 UNITS TOTAL) INTO THE SKIN EVERY MORNING. 10 mL 2  . Insulin Syringe-Needle U-100 (TRUEPLUS INSULIN SYRINGE) 30G X 5/16" 0.3 ML MISC USE AS DIRECTED 3 TIMES DAILY. 90 each 1  . Insulin Syringe-Needle U-100 30G X 5/16" 0.5 ML MISC Use as directed. 100 each 11  . isosorbide mononitrate (IMDUR) 60 MG 24 hr tablet TAKE 1 TABLET (60 MG TOTAL) BY MOUTH DAILY. 30 tablet 6  . Lancets (ONETOUCH ULTRASOFT) lancets Check CBG twice a day 60 each 5  . mexiletine (MEXITIL) 150 MG capsule TAKE 2 CAPSULES (300 MG TOTAL) BY MOUTH EVERY 12 (TWELVE) HOURS. 120 capsule 6  . Multiple Vitamins-Minerals (WOMENS MULTI PO) Take 1 tablet by mouth daily.    . potassium chloride (KLOR-CON) 10 MEQ tablet Take 1 tablet (10 mEq total) by mouth daily. 30 tablet 6  . sacubitril-valsartan (ENTRESTO) 24-26 MG Take 1 tablet by mouth 2 (two) times daily. 60 tablet 11  . TRUEplus Lancets 28G MISC CHECK BLOOD GLUCOSE TWICE A DAY 60 each 5   No current facility-administered medications for this visit.     Past Medical History:  Diagnosis Date  . Atrial fibrillation with RVR ([place])   . Bigeminy 01/2020  . CHF (congestive heart failure) ([place]) 10/20/2019  . CKD (chronic kidney disease), stage  IV ([place])   . Diabetes mellitus without complication ([place])   . DOE (dyspnea on exertion)    walking upstairs or up hill resolves in one minute  . Fibroids   . History of kidney stones   . History of recent blood transfusion 02/26/2020  . Hypertension   . Iron deficiency anemia   . Non-ischemic cardiomyopathy (HCC)    tachycardia induced  . Obese   . Peripheral edema   . Premature ventricular contractions (PVCs) ([facility])   . Stroke ([place]) 12/2020  . Umbilical hernia   . Wears glasses     ROS:   All systems reviewed and negative except as noted in the HPI.   Past Surgical History:  Procedure Laterality Date  . CHOLECYSTECTOMY    . CYSTOSCOPY W/ URETERAL STENT PLACEMENT Bilateral  05/24/2020   Procedure: CYSTOSCOPY WITH RETROGRADE PYELOGRAM/URETERAL STENT PLACEMENT;  Surgeon: Robley Fries, MD;  Location: [facility];  Service: Urology;  Laterality: Bilateral;  . CYSTOSCOPY/RETROGRADE/URETEROSCOPY Bilateral 04/03/2020   Procedure: CYSTOSCOPY/RETROGRADE/URETEROSCOPY;  Surgeon: Ardis Hughs, MD;  Location: [facility];  Service: Urology;  Laterality: Bilateral;  . HYSTERECTOMY ABDOMINAL WITH SALPINGO-OOPHORECTOMY  07/21/2020   Procedure: HYSTERECTOMY ABDOMINAL WITH BILATERAL SALPINGO-OOPHORECTOMY;  Surgeon: Sanjuana Kava, MD;  Location: [facility];  Service: Gynecology;;  . IR FLUORO GUIDE CV LINE RIGHT  02/21/2020  . IR US GUIDE VASC ACCESS RIGHT  02/21/2020  . NEPHROLITHOTOMY Left 02/14/2020   Procedure: NEPHROLITHOTOMY PERCUTANEOUS/ SURGEON ACCESS/ LEFT PERCUTANEOUS NEPHROSTOMY TUBE PLACEMENT;  Surgeon: Ardis Hughs, MD;  Location: [facility];  Service: Urology;  Laterality: Left;  . NEPHROLITHOTOMY Left 02/21/2020   Procedure: NEPHROLITHOTOMY PERCUTANEOUS SECOND LOOK;  Surgeon: Ardis Hughs, MD;  Location: [facility];  Service: Urology;  Laterality: Left;  . NEPHROLITHOTOMY Left 02/26/2020   Procedure: NEPHROLITHOTOMY PERCUTANEOUS;  Surgeon: Ceasar Mons, MD;  Location: [facility];  Service: Urology;  Laterality: Left;  NEED 150 MIN  . NEPHROLITHOTOMY Right 03/24/2020   Procedure: NEPHROLITHOTOMY PERCUTANEOUS WITH ACCESS LEFT STENT REMOVAL;  Surgeon: Ardis Hughs, MD;  Location: [facility];  Service: Urology;  Laterality: Right;  . RIGHT HEART CATH N/A 11/09/2019   Procedure: RIGHT HEART CATH;  Surgeon: Jolaine Artist, MD;  Location: [place];  Service: Cardiovascular;  Laterality: N/A;  . WISDOM TOOTH EXTRACTION       Family History  Problem Relation Age of Onset  . Atrial fibrillation Mother   . Hypertension Mother   . Stroke Mother 79  . Diabetes Mother   . Diabetes Father   . Hypertension Father   . Diabetes Sister   . Hypertension Sister   .  Diabetes Brother   . Hypertension Brother   . Diabetes Brother   . Heart attack Brother   . Stroke Maternal Grandmother 25     Social History   Socioeconomic History  . Marital status: Married    Spouse name: Not on file  . Number of children: 0  . Years of education: Not on file  . Highest education level: Associate degree: occupational, Hotel manager, or vocational program  Occupational History  . Occupation: unemployed  Tobacco Use  . Smoking status: Never Smoker  . Smokeless tobacco: Never Used  Vaping Use  . Vaping Use: Never used  Substance and Sexual Activity  . Alcohol use: Never  . Drug use: Never  . Sexual activity: Yes    Birth control/protection: None  Other Topics Concern  . Not on file  Social History Narrative  .  Not on file   Social Determinants of Health   Financial Resource Strain: Not on file  Food Insecurity: Not on file  Transportation Needs: No Transportation Needs  . Lack of Transportation (Medical): No  . Lack of Transportation (Non-Medical): No  Physical Activity: Not on file  Stress: Not on file  Social Connections: Not on file  Intimate Partner Violence: Not on file     BP 138/64   Pulse 64   Ht 5\' 8"  (1.727 m)   Wt 234 lb (106.1 kg)   SpO2 98%   BMI 35.58 kg/m   Physical Exam:  stable appearing NAD HEENT: Unremarkable Neck:  No JVD, no thyromegally Lymphatics:  No adenopathy Back:  No CVA tenderness Lungs:  Clear with no wheezes HEART:  Regular rate rhythm, no murmurs, no rubs, no clicks Abd:  soft, positive bowel sounds, no organomegally, no rebound, no guarding Ext:  2 plus pulses, no edema, no cyanosis, no clubbing Skin:  No rashes no nodules Neuro:  CN II through XII intact, motor grossly intact  EKG - nsr with PVC's  Assess/Plan: 1. Frequent PVC's - she is asymptomatic and her EF is improved. Her PVC's are left sided probably around the LV OT, possibly in the root. We discussed catheter ablation. With her EF  improving, and because she is asymptomatic, I think the risk benefit of ablation makes the procedure contra-indicated. I have explained this carefully to the patient. If she were to develop symptoms or her EF were to go down, then ablation or a re-trial of amiodarone would be recommended.  2. Chronic systolic heart failure - her symptoms are class 2A and she feels better. Continue medical therapy under the direction of Dr. Reine Just.  3. HTN - her bp is minimally elevated.  4. Obesity - she is encouraged to lose weight.  Carleene Overlie Orvell Careaga,MD

## 2021-05-05 NOTE — Patient Instructions (Addendum)
Medication Instructions:  °Your physician recommends that you continue on your current medications as directed. Please refer to the Current Medication list given to you today. ° °Labwork: °None ordered. ° °Testing/Procedures: °None ordered. ° °Follow-Up: °Your physician wants you to follow-up in: 6 months with Gregg Taylor, MD or one of the following Advanced Practice Providers on your designated Care Team:   °· Amber Seiler, NP °· Renee Ursuy, PA-C °· Michael "Andy" Tillery, PA-C ° ° °Any Other Special Instructions Will Be Listed Below (If Applicable). ° °If you need a refill on your cardiac medications before your next appointment, please call your pharmacy.  ° ° ° ° ° °

## 2021-05-06 ENCOUNTER — Ambulatory Visit (HOSPITAL_COMMUNITY)
Admission: RE | Admit: 2021-05-06 | Discharge: 2021-05-06 | Disposition: A | Discharge status: Home / Self Care | Payer: Self-pay | Source: Ambulatory Visit | Attending: Internal Medicine | Admitting: Internal Medicine

## 2021-05-06 ENCOUNTER — Other Ambulatory Visit (HOSPITAL_COMMUNITY): Payer: Self-pay | Admitting: Internal Medicine

## 2021-05-06 DIAGNOSIS — I4891 Unspecified atrial fibrillation: Secondary | ICD-10-CM | POA: Insufficient documentation

## 2021-05-06 DIAGNOSIS — N179 Acute kidney failure, unspecified: Secondary | ICD-10-CM | POA: Insufficient documentation

## 2021-05-06 LAB — BASIC METABOLIC PANEL
Anion gap: 8 (ref 5–15)
BUN: 24 mg/dL — ABNORMAL HIGH (ref 6–20)
CO2: 25 mmol/L (ref 22–32)
Calcium: 9.3 mg/dL (ref 8.9–10.3)
Chloride: 109 mmol/L (ref 98–111)
Creatinine, Ser: 1.78 mg/dL — ABNORMAL HIGH (ref 0.44–1.00)
GFR, Estimated: 34 mL/min — ABNORMAL LOW (ref >60)
Glucose, Bld: 97 mg/dL (ref 70–99)
Potassium: 3.3 mmol/L — ABNORMAL LOW (ref 3.5–5.1)
Sodium: 142 mmol/L (ref 135–145)

## 2021-05-07 ENCOUNTER — Other Ambulatory Visit (HOSPITAL_COMMUNITY): Payer: Self-pay

## 2021-05-08 ENCOUNTER — Other Ambulatory Visit (HOSPITAL_COMMUNITY): Payer: Self-pay | Admitting: Internal Medicine

## 2021-05-08 ENCOUNTER — Other Ambulatory Visit: Payer: Self-pay

## 2021-05-08 ENCOUNTER — Ambulatory Visit (HOSPITAL_COMMUNITY)
Admission: RE | Admit: 2021-05-08 | Discharge: 2021-05-08 | Disposition: A | Discharge status: Home / Self Care | Payer: Self-pay | Source: Ambulatory Visit | Attending: Cardiology | Admitting: Cardiology

## 2021-05-08 DIAGNOSIS — I4891 Unspecified atrial fibrillation: Secondary | ICD-10-CM

## 2021-05-08 LAB — BASIC METABOLIC PANEL
Anion gap: 8 (ref 5–15)
BUN: 23 mg/dL — ABNORMAL HIGH (ref 6–20)
CO2: 24 mmol/L (ref 22–32)
Calcium: 9 mg/dL (ref 8.9–10.3)
Chloride: 106 mmol/L (ref 98–111)
Creatinine, Ser: 2.13 mg/dL — ABNORMAL HIGH (ref 0.44–1.00)
GFR, Estimated: 27 mL/min — ABNORMAL LOW (ref >60)
Glucose, Bld: 166 mg/dL — ABNORMAL HIGH (ref 70–99)
Potassium: 3.2 mmol/L — ABNORMAL LOW (ref 3.5–5.1)
Sodium: 138 mmol/L (ref 135–145)

## 2021-05-08 NOTE — Progress Notes (Signed)
b

## 2021-05-11 ENCOUNTER — Telehealth (HOSPITAL_COMMUNITY): Payer: Self-pay | Admitting: *Deleted

## 2021-05-11 DIAGNOSIS — I5022 Chronic systolic (congestive) heart failure: Secondary | ICD-10-CM

## 2021-05-11 NOTE — Telephone Encounter (Signed)
-----   Message from Conrad [place], NP sent at 05/11/2021  1:21 PM EDT ----- Please increased K to 20 meq twice a day. Continue entresto and repeat BMEt in 7 days. Please call.

## 2021-05-11 NOTE — Telephone Encounter (Signed)
Spoke w/pt she states she has actually been out of her KCL for about 3 weeks due to cost however she is picking it up today. Advised to get today and take 20 meq daily for 2 days then back to her 10 meq daily, repeat labs sch for 6/2 as pt will be out of town for a long holiday weekend

## 2021-05-13 ENCOUNTER — Telehealth: Payer: Self-pay | Admitting: Family Medicine

## 2021-05-13 ENCOUNTER — Other Ambulatory Visit: Payer: Self-pay

## 2021-05-13 NOTE — Telephone Encounter (Signed)
Patient called, left VM to return the call to the office to schedule an OV for follow up.  ? ?

## 2021-05-13 NOTE — Telephone Encounter (Signed)
Requested medication (s) are due for refill today: Yes  Requested medication (s) are on the active medication list: Yes  Last refill:  03/2021  Future visit scheduled: No  Notes to clinic:  Unable to refill per protocol, appointment needed. Courtesy refill already given, routing to provider for approval,      Requested Prescriptions  Pending Prescriptions Disp Refills   insulin glargine (LANTUS) 100 UNIT/ML injection 10 mL 2    Sig: INJECT 0.2 MLS (20 UNITS TOTAL) INTO THE SKIN EVERY MORNING.      Endocrinology:  Diabetes - Insulins Failed - 05/13/2021  3:39 PM      Failed - HBA1C is between 0 and 7.9 and within 180 days    Hgb A1c MFr Bld  Date Value Ref Range Status  12/25/2020 8.0 (H) 4.8 - 5.6 % Final    Comment:    (NOTE) Pre diabetes:          5.7%-6.4%  Diabetes:              >6.4%  Glycemic control for   <7.0% adults with diabetes           Failed - Valid encounter within last 6 months    Recent Outpatient Visits           10 months ago Type 2 diabetes mellitus with hyperglycemia, with long-term current use of insulin [facility])   Davy, Annie Main L, RPH-CPP   11 months ago Type 2 diabetes mellitus with hyperglycemia, with long-term current use of insulin [facility])   [place], RPH-CPP

## 2021-05-14 NOTE — Telephone Encounter (Signed)
Please schedule appointment for refills, hemoglobin A1C, and labs.

## 2021-05-19 ENCOUNTER — Other Ambulatory Visit: Payer: Self-pay

## 2021-05-20 ENCOUNTER — Other Ambulatory Visit: Payer: Self-pay | Admitting: *Deleted

## 2021-05-20 ENCOUNTER — Other Ambulatory Visit: Payer: Self-pay

## 2021-05-20 MED ORDER — INSULIN GLARGINE 100 UNIT/ML ~~LOC~~ SOLN
SUBCUTANEOUS | 0 refills | Status: DC
  Filled 2021-05-20: qty 10, 28d supply, fill #0

## 2021-05-20 NOTE — Telephone Encounter (Signed)
Pt has appointment scheduled on 05/22/2021 at 10:50a with Juleen China. Pt stated she is out of insulin and can't go that long. Pt is also wondering why she needed to be seen before meds refilled when it hasn't been 3 months.

## 2021-05-21 ENCOUNTER — Other Ambulatory Visit: Payer: Self-pay

## 2021-05-21 ENCOUNTER — Ambulatory Visit (HOSPITAL_COMMUNITY)
Admission: RE | Admit: 2021-05-21 | Discharge: 2021-05-21 | Disposition: A | Discharge status: Home / Self Care | Payer: Self-pay | Source: Ambulatory Visit | Attending: Internal Medicine | Admitting: Internal Medicine

## 2021-05-21 DIAGNOSIS — I5022 Chronic systolic (congestive) heart failure: Secondary | ICD-10-CM | POA: Insufficient documentation

## 2021-05-21 LAB — BASIC METABOLIC PANEL
Anion gap: 8 (ref 5–15)
BUN: 22 mg/dL — ABNORMAL HIGH (ref 6–20)
CO2: 24 mmol/L (ref 22–32)
Calcium: 9.1 mg/dL (ref 8.9–10.3)
Chloride: 104 mmol/L (ref 98–111)
Creatinine, Ser: 1.64 mg/dL — ABNORMAL HIGH (ref 0.44–1.00)
GFR, Estimated: 37 mL/min — ABNORMAL LOW (ref >60)
Glucose, Bld: 238 mg/dL — ABNORMAL HIGH (ref 70–99)
Potassium: 3.3 mmol/L — ABNORMAL LOW (ref 3.5–5.1)
Sodium: 136 mmol/L (ref 135–145)

## 2021-05-22 ENCOUNTER — Ambulatory Visit (INDEPENDENT_AMBULATORY_CARE_PROVIDER_SITE_OTHER): Payer: Self-pay | Admitting: Internal Medicine

## 2021-05-22 ENCOUNTER — Encounter: Payer: Self-pay | Admitting: Internal Medicine

## 2021-05-22 ENCOUNTER — Other Ambulatory Visit: Payer: Self-pay

## 2021-05-22 VITALS — BP 144/84 | HR 59 | Resp 16 | Ht 67.75 in | Wt 227.0 lb

## 2021-05-22 DIAGNOSIS — I10 Essential (primary) hypertension: Secondary | ICD-10-CM

## 2021-05-22 DIAGNOSIS — Z794 Long term (current) use of insulin: Secondary | ICD-10-CM

## 2021-05-22 DIAGNOSIS — E1165 Type 2 diabetes mellitus with hyperglycemia: Secondary | ICD-10-CM

## 2021-05-22 LAB — POCT GLYCOSYLATED HEMOGLOBIN (HGB A1C): HbA1c, POC (controlled diabetic range): 8 % — AB (ref 0.0–7.0)

## 2021-05-22 MED ORDER — INSULIN GLARGINE 100 UNIT/ML ~~LOC~~ SOLN
SUBCUTANEOUS | 2 refills | Status: DC
  Filled 2021-05-22: qty 10, fill #0
  Filled 2021-07-03: qty 10, 28d supply, fill #0
  Filled 2021-09-01: qty 10, 28d supply, fill #1
  Filled 2021-10-26: qty 10, 28d supply, fill #2

## 2021-05-22 MED ORDER — POTASSIUM CHLORIDE ER 10 MEQ PO TBCR
10.0000 meq | EXTENDED_RELEASE_TABLET | Freq: Every day | ORAL | 6 refills | Status: DC
  Filled 2021-05-22: qty 30, 30d supply, fill #0
  Filled 2021-07-03: qty 30, 30d supply, fill #1
  Filled 2021-08-03: qty 30, 30d supply, fill #2
  Filled 2021-08-31 – 2021-09-14 (×2): qty 30, 30d supply, fill #3

## 2021-05-22 MED ORDER — CARVEDILOL 6.25 MG PO TABS
ORAL_TABLET | Freq: Two times a day (BID) | ORAL | 6 refills | Status: DC
  Filled 2021-05-22: qty 60, 30d supply, fill #0
  Filled 2021-07-03: qty 60, 30d supply, fill #1
  Filled 2021-08-25: qty 60, 30d supply, fill #2

## 2021-05-22 MED ORDER — AMLODIPINE BESYLATE 10 MG PO TABS
ORAL_TABLET | Freq: Every day | ORAL | 6 refills | Status: DC
  Filled 2021-05-22: qty 30, 30d supply, fill #0
  Filled 2021-07-03: qty 30, 30d supply, fill #1
  Filled 2021-08-03: qty 30, 30d supply, fill #2
  Filled 2021-09-14: qty 30, 30d supply, fill #3

## 2021-05-22 MED ORDER — INSULIN ASPART 100 UNIT/ML IJ SOLN
INTRAMUSCULAR | 99 refills | Status: AC
  Filled 2021-05-22: qty 10, fill #0
  Filled 2021-07-03: qty 10, 28d supply, fill #0
  Filled 2022-02-18: qty 10, 28d supply, fill #1
  Filled 2022-02-18: qty 10, 28d supply, fill #0

## 2021-05-22 NOTE — Patient Instructions (Signed)
Increase Lantus to 22 units daily. Call if your fasting blood sugars are greater than 150 or less than 80.

## 2021-05-22 NOTE — Progress Notes (Signed)
Follow up- DM  Medication RF

## 2021-05-22 NOTE — Progress Notes (Signed)
Subjective:    Adrienne Lane - 55 y.o. female MRN [id number]  Date of birth: 1966-10-14  HPI  Adrienne Lane is here for follow up.  Diabetes mellitus, Type 2 Disease Monitoring             Blood Sugar Ranges: Fasting - Mostly 130-140s              Polyuria: no              Visual problems: no   Urine Microalbumin >1000, on Valsartan   Last A1C: 8.0 (Jan 2022)   Medications: Farxiga 10 mg daily, Lantus 20u daily, Novolog SSI  Medication Compliance: yes, although was out of Lantus for 4 days   Medication Side Effects             Hypoglycemia: no       Health Maintenance:  Health Maintenance Due  Topic Date Due  . PNEUMOCOCCAL POLYSACCHARIDE VACCINE AGE 22-64 HIGH RISK  Never done  . Pneumococcal Vaccine 51-44 Years old (1 of 2 - PPSV23) Never done  . OPHTHALMOLOGY EXAM  Never done  . TETANUS/TDAP  Never done  . COLONOSCOPY (Pts 45-41yrs Insurance coverage will need to be confirmed)  Never done  . Zoster Vaccines- Shingrix (1 of 2) Never done  . COVID-19 Vaccine (2 - Moderna 3-dose series) 01/08/2021  . FOOT EXAM  05/12/2021    -  reports that she has never smoked. She has never used smokeless tobacco. - Review of Systems: Per HPI. - Past Medical History: Patient Active Problem List   Diagnosis Date Noted  . Febrile illness, acute 01/03/2021  . Slow transit constipation   . Sore throat   . Irregular cardiac rhythm   . PAF (paroxysmal atrial fibrillation) ([place])   . Labile blood pressure   . Hypokalemia   . Labile blood glucose   . Poorly controlled type 2 diabetes mellitus with peripheral neuropathy ([place])   . Stage 3b chronic kidney disease ([place])   . Right pontine cerebrovascular accident ([place]) 12/27/2020  . Acute CVA (cerebrovascular accident) ([place]) 12/24/2020  . Hyperlipidemia 10/24/2020  . Vitamin D deficiency 10/24/2020  . Fibroids 07/21/2020  . S/P TAH (total abdominal hysterectomy) 07/21/2020  . Chronic a-fib ([place]) 05/23/2020  . Hydroureter on right  05/23/2020  . Microalbuminuria 05/13/2020  . Non-ischemic cardiomyopathy ([place])   . Normocytic anemia   . Nephrolithiasis 02/14/2020  . Paroxysmal atrial fibrillation ([place]) 12/18/2019  . 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 Gross hematuria 12/18/2019  . Staghorn renal calculus 12/18/2019  . Umbilical hernia without obstruction and without gangrene 12/18/2019  . Uterine fibroid 10/25/2019  . Right hip pain   . Type 2 diabetes mellitus ([place])   . Essential hypertension   . CHF (congestive heart failure) ([place]) 10/21/2019  . Atrial fibrillation with RVR ([place]) 10/21/2019  . Atrial fibrillation with rapid ventricular response ([place]) 10/20/2019   - Medications: reviewed and updated   Objective:   Physical Exam BP (!) 144/84   Pulse (!) 59   Resp 16   Ht 5' 7.75" (1.721 m)   Wt 227 lb (103 kg)   SpO2 98%   BMI 34.77 kg/m  Physical Exam Constitutional:      General: She is not in acute distress.    Appearance: She is not diaphoretic.  Cardiovascular:     Rate and Rhythm: Normal rate.  Pulmonary:     Effort: Pulmonary effort is normal. No respiratory distress.  Musculoskeletal:  General: Normal range of motion.  Skin:    General: Skin is warm and dry.  Neurological:     Mental Status: She is alert and oriented to person, place, and time.  Psychiatric:        Mood and Affect: Affect normal.        Judgment: Judgment normal.            Assessment & Plan:   1. Type 2 diabetes mellitus with hyperglycemia, with long-term current use of insulin (HCC) A1c is 8.0 Reports fairly well controlled fasting CBGs. Will have patient increase Lantus from 20 to 22u and monitor.  Counseled on Diabetic diet, my plate method, 177 minutes of moderate intensity exercise/week Blood sugar logs with fasting goals of 80-120 mg/dl, random of less than 180 and in the event of sugars less than 60 mg/dl or greater than 400 mg/dl encouraged to notify the clinic. Advised on the need for annual eye exams,  annual foot exams, Pneumonia vaccine. - HgB A1c - insulin glargine (LANTUS) 100 UNIT/ML injection; INJECT 0.22 MLS (22 UNITS TOTAL) INTO THE SKIN EVERY MORNING.  Dispense: 10 mL; Refill: 2  2. Essential hypertension BP very near goal. Reports compliance. Asymptomatic. Continue current regimen.  - amLODipine (NORVASC) 10 MG tablet; TAKE 1 TABLET (10 MG TOTAL) BY MOUTH DAILY.  Dispense: 30 tablet; Refill: 6 - potassium chloride (KLOR-CON) 10 MEQ tablet; Take 1 tablet (10 mEq total) by mouth daily.  Dispense: 30 tablet; Refill: 6 - carvedilol (COREG) 6.25 MG tablet; TAKE 1 TABLET (6.25 MG TOTAL) BY MOUTH 2 (TWO) TIMES DAILY WITH A MEAL.  Dispense: 60 tablet; Refill: [place], D.O. 05/22/2021, 11:18 AM Primary Care at [facility]

## 2021-05-25 ENCOUNTER — Other Ambulatory Visit: Payer: Self-pay

## 2021-05-25 MED FILL — Mexiletine HCl Cap 150 MG: ORAL | 30 days supply | Qty: 120 | Fill #1 | Status: AC

## 2021-06-03 ENCOUNTER — Other Ambulatory Visit: Payer: Self-pay

## 2021-06-03 ENCOUNTER — Other Ambulatory Visit (HOSPITAL_BASED_OUTPATIENT_CLINIC_OR_DEPARTMENT_OTHER): Payer: Self-pay

## 2021-06-03 DIAGNOSIS — I509 Heart failure, unspecified: Secondary | ICD-10-CM | POA: Insufficient documentation

## 2021-06-03 DIAGNOSIS — N183 Chronic kidney disease, stage 3 unspecified: Secondary | ICD-10-CM

## 2021-06-03 DIAGNOSIS — E876 Hypokalemia: Secondary | ICD-10-CM

## 2021-06-03 DIAGNOSIS — E119 Type 2 diabetes mellitus without complications: Secondary | ICD-10-CM

## 2021-06-03 MED ORDER — ATORVASTATIN CALCIUM 80 MG PO TABS
ORAL_TABLET | ORAL | 3 refills | Status: DC
  Filled 2021-08-31: qty 90, 90d supply, fill #0
  Filled 2022-01-15: qty 90, 90d supply, fill #1
  Filled 2022-01-15 (×2): qty 90, 90d supply, fill #0

## 2021-06-05 ENCOUNTER — Other Ambulatory Visit: Payer: Self-pay

## 2021-06-10 ENCOUNTER — Other Ambulatory Visit: Payer: Self-pay

## 2021-06-12 ENCOUNTER — Other Ambulatory Visit: Payer: Self-pay

## 2021-06-12 MED FILL — Hydralazine HCl Tab 100 MG: ORAL | 30 days supply | Qty: 90 | Fill #1 | Status: AC

## 2021-06-14 ENCOUNTER — Emergency Department (HOSPITAL_COMMUNITY): Payer: Self-pay

## 2021-06-14 ENCOUNTER — Emergency Department (HOSPITAL_COMMUNITY)
Admission: EM | Admit: 2021-06-14 | Discharge: 2021-06-14 | Disposition: A | Discharge status: Home / Self Care | Payer: Self-pay | Attending: Emergency Medicine | Admitting: Emergency Medicine

## 2021-06-14 ENCOUNTER — Emergency Department (HOSPITAL_BASED_OUTPATIENT_CLINIC_OR_DEPARTMENT_OTHER): Payer: Self-pay

## 2021-06-14 ENCOUNTER — Encounter (HOSPITAL_COMMUNITY): Payer: Self-pay | Admitting: *Deleted

## 2021-06-14 ENCOUNTER — Other Ambulatory Visit: Payer: Self-pay

## 2021-06-14 DIAGNOSIS — Z7982 Long term (current) use of aspirin: Secondary | ICD-10-CM | POA: Insufficient documentation

## 2021-06-14 DIAGNOSIS — I82402 Acute embolism and thrombosis of unspecified deep veins of left lower extremity: Secondary | ICD-10-CM | POA: Insufficient documentation

## 2021-06-14 DIAGNOSIS — Z7984 Long term (current) use of oral hypoglycemic drugs: Secondary | ICD-10-CM | POA: Insufficient documentation

## 2021-06-14 DIAGNOSIS — I130 Hypertensive heart and chronic kidney disease with heart failure and stage 1 through stage 4 chronic kidney disease, or unspecified chronic kidney disease: Secondary | ICD-10-CM | POA: Insufficient documentation

## 2021-06-14 DIAGNOSIS — I509 Heart failure, unspecified: Secondary | ICD-10-CM | POA: Insufficient documentation

## 2021-06-14 DIAGNOSIS — I4891 Unspecified atrial fibrillation: Secondary | ICD-10-CM | POA: Insufficient documentation

## 2021-06-14 DIAGNOSIS — R609 Edema, unspecified: Secondary | ICD-10-CM

## 2021-06-14 DIAGNOSIS — Z794 Long term (current) use of insulin: Secondary | ICD-10-CM | POA: Insufficient documentation

## 2021-06-14 DIAGNOSIS — N184 Chronic kidney disease, stage 4 (severe): Secondary | ICD-10-CM | POA: Insufficient documentation

## 2021-06-14 DIAGNOSIS — Z79899 Other long term (current) drug therapy: Secondary | ICD-10-CM | POA: Insufficient documentation

## 2021-06-14 DIAGNOSIS — M79662 Pain in left lower leg: Secondary | ICD-10-CM

## 2021-06-14 DIAGNOSIS — M79605 Pain in left leg: Secondary | ICD-10-CM

## 2021-06-14 DIAGNOSIS — Z955 Presence of coronary angioplasty implant and graft: Secondary | ICD-10-CM | POA: Insufficient documentation

## 2021-06-14 DIAGNOSIS — I82812 Embolism and thrombosis of superficial veins of left lower extremities: Secondary | ICD-10-CM

## 2021-06-14 DIAGNOSIS — E1140 Type 2 diabetes mellitus with diabetic neuropathy, unspecified: Secondary | ICD-10-CM | POA: Insufficient documentation

## 2021-06-14 DIAGNOSIS — Z7901 Long term (current) use of anticoagulants: Secondary | ICD-10-CM | POA: Insufficient documentation

## 2021-06-14 LAB — CBC WITH DIFFERENTIAL/PLATELET
Abs Immature Granulocytes: 0.02 10*3/uL (ref 0.00–0.07)
Basophils Absolute: 0 10*3/uL (ref 0.0–0.1)
Basophils Relative: 1 %
Eosinophils Absolute: 0.2 10*3/uL (ref 0.0–0.5)
Eosinophils Relative: 3 %
HCT: 41.1 % (ref 36.0–46.0)
Hemoglobin: 13.1 g/dL (ref 12.0–15.0)
Immature Granulocytes: 0 %
Lymphocytes Relative: 28 %
Lymphs Abs: 1.5 10*3/uL (ref 0.7–4.0)
MCH: 28.2 pg (ref 26.0–34.0)
MCHC: 31.9 g/dL (ref 30.0–36.0)
MCV: 88.6 fL (ref 80.0–100.0)
Monocytes Absolute: 0.5 10*3/uL (ref 0.1–1.0)
Monocytes Relative: 9 %
Neutro Abs: 3.2 10*3/uL (ref 1.7–7.7)
Neutrophils Relative %: 59 %
Platelets: 197 10*3/uL (ref 150–400)
RBC: 4.64 MIL/uL (ref 3.87–5.11)
RDW: 13.5 % (ref 11.5–15.5)
WBC: 5.4 10*3/uL (ref 4.0–10.5)
nRBC: 0 % (ref 0.0–0.2)

## 2021-06-14 LAB — BASIC METABOLIC PANEL
Anion gap: 11 (ref 5–15)
BUN: 23 mg/dL — ABNORMAL HIGH (ref 6–20)
CO2: 18 mmol/L — ABNORMAL LOW (ref 22–32)
Calcium: 9.1 mg/dL (ref 8.9–10.3)
Chloride: 109 mmol/L (ref 98–111)
Creatinine, Ser: 1.69 mg/dL — ABNORMAL HIGH (ref 0.44–1.00)
GFR, Estimated: 36 mL/min — ABNORMAL LOW (ref >60)
Glucose, Bld: 88 mg/dL (ref 70–99)
Potassium: 4 mmol/L (ref 3.5–5.1)
Sodium: 138 mmol/L (ref 135–145)

## 2021-06-14 NOTE — ED Triage Notes (Signed)
Pt fell 9 days ago and has a bruise to left lower leg.  Pt states that it is not getting better and she would like it to be evaluated.

## 2021-06-14 NOTE — ED Notes (Signed)
Patient transported to Ultrasound 

## 2021-06-14 NOTE — Progress Notes (Signed)
Left lower extremity venous duplex completed. Refer to "CV Proc" under chart review to view preliminary results.  06/14/2021 3:27 PM Kelby Aline., MHA, RVT, RDCS, RDMS

## 2021-06-14 NOTE — ED Provider Notes (Signed)
[facility] EMERGENCY DEPARTMENT Provider Note   CSN: [id number] Arrival date & time: 06/14/21  1211     History Chief Complaint  Patient presents with   Leg Pain    Bruise from fall    Adrienne Lane is a 55 y.o. female with past medical history of CKD, CHF, A. fib with RVR, hypertension, nonischemic cardiomyopathy that presents to the emergency department today for leg edema.  Patient states that she fell 8 days ago, however her leg started to swell and is continuously hurting her and the swelling and pain have not resolved over the last 9 days.  Patient denies any knee or ankle pain, pain is primarily in her lower extremity.  Patient states that she is able to bear weight.  Patient is on Pradaxa, has been compliant with this, she states that its for heart disease.  Denies any previous history of blood clot.  Denies any shortness of breath or chest pain, recent travel, recent surgery.  Patient also states that the area is slightly warm to touch.  Denies any penetrative injury or abrasions.  Denies any numbness or tingling down into her leg.  Denies any fevers.  No recent antibiotics.  Denies any abdominal pain, nausea, vomiting, back pain, prior injury to this leg.  States that she was able to get up from the fall, was mechanical after tripping on her mother-in-law's wheelchair.  No other complaints at this time.    HPI     Past Medical History:  Diagnosis Date   Atrial fibrillation with RVR ([place])    [place] 01/2020   CHF (congestive heart failure) ([place]) 10/20/2019   CKD (chronic kidney disease), stage IV (HCC)    Diabetes mellitus without complication ([place])    DOE (dyspnea on exertion)    walking upstairs or up hill resolves in one minute   Fibroids    History of kidney stones    History of recent blood transfusion 02/26/2020   Hypertension    Iron deficiency anemia    Non-ischemic cardiomyopathy (HCC)    tachycardia induced   Obese    Peripheral edema     Premature ventricular contractions (PVCs) ([facility])    Stroke ([place]) 52/8413   Umbilical hernia    Wears glasses     Patient Active Problem List   Diagnosis Date Noted   Febrile illness, acute 01/03/2021   Slow transit constipation    Sore throat    Irregular cardiac rhythm    PAF (paroxysmal atrial fibrillation) ([place])    Labile blood pressure    Hypokalemia    Labile blood glucose    Poorly controlled type 2 diabetes mellitus with peripheral neuropathy ([place])    Stage 3b chronic kidney disease ([place])    Right pontine cerebrovascular accident ([place]) 12/27/2020   Acute CVA (cerebrovascular accident) ([place]) 12/24/2020   Hyperlipidemia 10/24/2020   Vitamin D deficiency 10/24/2020   Fibroids 07/21/2020   S/P TAH (total abdominal hysterectomy) 07/21/2020   Chronic a-fib ([place]) 05/23/2020   Hydroureter on right 05/23/2020   Microalbuminuria 05/13/2020   Non-ischemic cardiomyopathy ([place])    Normocytic anemia    Nephrolithiasis 02/14/2020   Paroxysmal atrial fibrillation ([place]) 12/18/2019   Frequent PVCs 12/18/2019   Gross hematuria 12/18/2019   Staghorn renal calculus 24/40/1027   Umbilical hernia without obstruction and without gangrene 12/18/2019   Uterine fibroid 10/25/2019   Right hip pain    Type 2 diabetes mellitus ([place])    Essential hypertension    CHF (  congestive heart failure) ([place]) 10/21/2019   Atrial fibrillation with RVR ([place]) 10/21/2019   Atrial fibrillation with rapid ventricular response ([place]) 10/20/2019    Past Surgical History:  Procedure Laterality Date   CHOLECYSTECTOMY     CYSTOSCOPY W/ URETERAL STENT PLACEMENT Bilateral 05/24/2020   Procedure: CYSTOSCOPY WITH RETROGRADE PYELOGRAM/URETERAL STENT PLACEMENT;  Surgeon: Robley Fries, MD;  Location: [facility];  Service: Urology;  Laterality: Bilateral;   CYSTOSCOPY/RETROGRADE/URETEROSCOPY Bilateral 04/03/2020   Procedure: CYSTOSCOPY/RETROGRADE/URETEROSCOPY;  Surgeon: Ardis Hughs, MD;  Location: [facility];   Service: Urology;  Laterality: Bilateral;   HYSTERECTOMY ABDOMINAL WITH SALPINGO-OOPHORECTOMY  07/21/2020   Procedure: HYSTERECTOMY ABDOMINAL WITH BILATERAL SALPINGO-OOPHORECTOMY;  Surgeon: Sanjuana Kava, MD;  Location: [facility];  Service: Gynecology;;   IR FLUORO GUIDE CV LINE RIGHT  02/21/2020   IR US GUIDE VASC ACCESS RIGHT  02/21/2020   NEPHROLITHOTOMY Left 02/14/2020   Procedure: NEPHROLITHOTOMY PERCUTANEOUS/ SURGEON ACCESS/ LEFT PERCUTANEOUS NEPHROSTOMY TUBE PLACEMENT;  Surgeon: Ardis Hughs, MD;  Location[facility];  Service: Urology;  Laterality: Left;   NEPHROLITHOTOMY Left 02/21/2020   Procedure: NEPHROLITHOTOMY PERCUTANEOUS SECOND LOOK;  Surgeon: Ardis Hughs, MD;  [facility];  Service: Urology;  Laterality: Left;   NEPHROLITHOTOMY Left 02/26/2020   Procedure: NEPHROLITHOTOMY PERCUTANEOUS;  Surgeon: Ceasar Mons, MD;  Location: [facility];  Service: Urology;  Laterality: Left;  NEED 150 MIN   NEPHROLITHOTOMY Right 03/24/2020   Procedure: NEPHROLITHOTOMY PERCUTANEOUS WITH ACCESS LEFT STENT REMOVAL;  Surgeon: Ardis Hughs, MD;  Location[facility];  Service: Urology;  Laterality: Right;   RIGHT HEART CATH N/A 11/09/2019   Procedure: RIGHT HEART CATH;  Surgeon: Jolaine Artist, MD;  Location: [facility];  Service: Cardiovascular;  Laterality: N/A;   WISDOM TOOTH EXTRACTION       OB History     Gravida  0   Para  0   Term  0   Preterm  0   AB  0   Living  0      SAB  0   IAB  0   Ectopic  0   Multiple  0   Live Births  0           Family History  Problem Relation Age of Onset   Atrial fibrillation Mother    Hypertension Mother    Stroke Mother 61   Diabetes Mother    Diabetes Father    Hypertension Father    Diabetes Sister    Hypertension Sister    Diabetes Brother    Hypertension Brother    Diabetes Brother    Heart attack Brother    Stroke Maternal Grandmother 1    Social History   Tobacco Use   Smoking status:  Never   Smokeless tobacco: Never  Vaping Use   Vaping Use: Never used  Substance Use Topics   Alcohol use: Never   Drug use: Never    Home Medications Prior to Admission medications   Medication Sig Start Date End Date Taking? Authorizing Provider  amLODipine (NORVASC) 10 MG tablet TAKE 1 TABLET (10 MG TOTAL) BY MOUTH DAILY. 05/22/21 05/22/22  Nicolette Bang, MD  aspirin 81 MG chewable tablet Chew 1 tablet (81 mg total) by mouth daily. 12/27/20 12/22/21  Kayleen Memos, DO  atorvastatin (LIPITOR) 80 MG tablet Take 1 tablet (80 mg total) by mouth daily. 04/22/21   Bensimhon, Shaune Pascal, MD  atorvastatin (LIPITOR) 80 MG tablet take 1 tablet (80 mg) by  oral route once daily at bedtime for 90 days 06/03/21     carvedilol (COREG) 6.25 MG tablet TAKE 1 TABLET (6.25 MG TOTAL) BY MOUTH 2 (TWO) TIMES DAILY WITH A MEAL. 05/22/21 05/22/22  Nicolette Bang, MD  cholecalciferol (VITAMIN D3) 25 MCG (1000 UNIT) tablet Take 1,000 Units by mouth daily.    [provider]  Coenzyme Q10 (Q-10 CO-ENZYME PO) Take 1 capsule by mouth daily in the afternoon.    [provider]  dabigatran (PRADAXA) 150 MG CAPS capsule TAKE 1 CAPSULE (150 MG TOTAL) BY MOUTH EVERY 12 HOURS. 02/06/21 02/06/22  Bensimhon, Shaune Pascal, MD  dapagliflozin propanediol (FARXIGA) 10 MG TABS tablet Take 1 tablet (10 mg total) by mouth daily before breakfast. 06/02/20   Bensimhon, Shaune Pascal, MD  glucose blood (TRUE METRIX BLOOD GLUCOSE TEST) test strip Use as instructed 04/01/21   Charlott Rakes, MD  hydrALAZINE (APRESOLINE) 100 MG tablet TAKE 1 TABLET (100 MG TOTAL) BY MOUTH 3 (THREE) TIMES DAILY. 02/17/21 02/17/22  Bensimhon, Shaune Pascal, MD  insulin aspart (NOVOLOG) 100 UNIT/ML injection Use as directed sliding scale insulin TID. 05/22/21   Nicolette Bang, MD  insulin glargine (LANTUS) 100 UNIT/ML injection INJECT 0.22 MLS (22 UNITS TOTAL) INTO THE SKIN EVERY MORNING. 05/22/21   Nicolette Bang, MD  Insulin  Syringe-Needle U-100 (TRUEPLUS INSULIN SYRINGE) 30G X 5/16" 0.3 ML MISC USE AS DIRECTED 3 TIMES DAILY. 04/07/21 04/07/22  Nicolette Bang, MD  Insulin Syringe-Needle U-100 30G X 5/16" 0.5 ML MISC Use as directed. 04/09/21   Argentina Donovan, PA-C  isosorbide mononitrate (IMDUR) 60 MG 24 hr tablet TAKE 1 TABLET (60 MG TOTAL) BY MOUTH DAILY. 10/23/20 10/23/21  Darrick Grinder D, NP  Lancets (ONETOUCH ULTRASOFT) lancets Check CBG twice a day 01/21/21   Nicolette Bang, MD  mexiletine (MEXITIL) 150 MG capsule TAKE 2 CAPSULES (300 MG TOTAL) BY MOUTH EVERY 12 (TWELVE) HOURS. 11/24/20 11/24/21  Darrick Grinder D, NP  Multiple Vitamins-Minerals (WOMENS MULTI PO) Take 1 tablet by mouth daily.    [provider]  potassium chloride (KLOR-CON) 10 MEQ tablet Take 1 tablet (10 mEq total) by mouth daily. 05/22/21   Nicolette Bang, MD  sacubitril-valsartan (ENTRESTO) 24-26 MG Take 1 tablet by mouth 2 (two) times daily. 04/22/21   Bensimhon, Shaune Pascal, MD  TRUEplus Lancets 28G MISC CHECK BLOOD GLUCOSE TWICE A DAY 01/21/21 01/21/22  Nicolette Bang, MD  ELIQUIS 5 MG TABS tablet TAKE 1 TABLET (5 MG TOTAL) BY MOUTH 2 (TWO) TIMES DAILY. 12/24/20 12/27/20  Bensimhon, Shaune Pascal, MD  potassium chloride (KLOR-CON) 10 MEQ tablet Take 1 tablet (10 mEq total) by mouth daily. 02/06/21 05/22/21  Larey Dresser, MD  torsemide (DEMADEX) 20 MG tablet TAKE 2 TABLETS (40 MG TOTAL) BY MOUTH 2 (TWO) TIMES DAILY. 11/24/20 12/27/20  Darrick Grinder D, NP    Allergies    Adhesive [tape]  Review of Systems   Review of Systems  Constitutional:  Negative for chills, diaphoresis, fatigue and fever.  HENT:  Negative for congestion, sore throat and trouble swallowing.   Eyes:  Negative for pain and visual disturbance.  Respiratory:  Negative for cough, shortness of breath and wheezing.   Cardiovascular:  Negative for chest pain, palpitations and leg swelling.  Gastrointestinal:  Negative for abdominal distention, abdominal  pain, diarrhea, nausea and vomiting.  Genitourinary:  Negative for difficulty urinating.  Musculoskeletal:  Positive for arthralgias. Negative for back pain, neck pain and neck stiffness.  Skin:  Positive for color change. Negative for pallor.  Neurological:  Negative for dizziness, speech difficulty, weakness and headaches.  Psychiatric/Behavioral:  Negative for confusion.    Physical Exam Updated Vital Signs BP (!) 153/88 (BP Location: Right Arm)   Pulse (!) 58   Temp 98 F (36.7 C) (Oral)   Resp 16   SpO2 99%   Physical Exam Constitutional:      General: She is not in acute distress.    Appearance: Normal appearance. She is not ill-appearing, toxic-appearing or diaphoretic.  HENT:     Mouth/Throat:     Mouth: Mucous membranes are moist.     Pharynx: Oropharynx is clear.  Eyes:     General: No scleral icterus.    Extraocular Movements: Extraocular movements intact.     Pupils: Pupils are equal, round, and reactive to light.  Cardiovascular:     Rate and Rhythm: Normal rate and regular rhythm.     Pulses: Normal pulses.     Heart sounds: Normal heart sounds.  Pulmonary:     Effort: Pulmonary effort is normal. No respiratory distress.     Breath sounds: Normal breath sounds. No stridor. No wheezing, rhonchi or rales.  Chest:     Chest wall: No tenderness.  Abdominal:     General: Abdomen is flat. There is no distension.     Palpations: Abdomen is soft.     Tenderness: There is no abdominal tenderness. There is no guarding or rebound.  Musculoskeletal:        General: No swelling or tenderness. Normal range of motion.     Cervical back: Normal range of motion and neck supple. No rigidity.     Right lower leg: No edema.     Left lower leg: No edema.       Legs:     Comments: Patient with tenderness to palpation of left extremity with edema noted, 1+ pitting edema.  Patient does have a small area of erythema and warmth inferior to knee.  Patient is tender to touch here as  well.  No true major ecchymosis noted.  No knee pain or knee tenderness or knee erythema with normal range of motion to knee and ankle and hip.  Normal sensation with good strength to lower extremity with good leg raise.  Compartments are soft.  DP pulse 2+.  Right side normal.  Normal gait.  Skin:    General: Skin is warm and dry.     Capillary Refill: Capillary refill takes less than 2 seconds.     Coloration: Skin is not pale.  Neurological:     General: No focal deficit present.     Mental Status: She is alert and oriented to person, place, and time.  Psychiatric:        Mood and Affect: Mood normal.        Behavior: Behavior normal.    ED Results / Procedures / Treatments   Labs (all labs ordered are listed, but only abnormal results are displayed) Labs Reviewed  BASIC METABOLIC PANEL - Abnormal; Notable for the following components:      Result Value   CO2 18 (*)    BUN 23 (*)    Creatinine, Ser 1.69 (*)    GFR, Estimated 36 (*)    All other components within normal limits  CBC WITH DIFFERENTIAL/PLATELET    EKG None  Radiology DG Tibia/Fibula Left  Result Date: 06/14/2021 CLINICAL DATA:  Fall.  Lateral leg pain near the  left knee. EXAM: LEFT TIBIA AND FIBULA - 2 VIEW COMPARISON:  None. FINDINGS: No fracture or bone lesion. Narrowed patellofemoral joint space compartment. Marginal osteophytes noted from all 3 compartments. No joint effusion. Nonspecific subcutaneous soft tissue edema most evident anterolaterally along the proximal leg. IMPRESSION: 1. No fracture or acute skeletal abnormality. 2. Mild osteoarthritis of the left knee. Electronically Signed   By: Lajean Manes M.D.   On: 06/14/2021 14:52   VAS [place] LOWER EXTREMITY VENOUS (DVT) ([facility] 7a-7p)  Result Date: 06/14/2021  Lower Venous DVT Study Patient Name:  Adrienne Lane  Date of Exam:   06/14/2021 Medical Rec #: [id number]       Accession #:    [id number] Date of Birth: 18-Feb-1966       Patient Gender: F Patient  Age:   89Y Exam Location:  [facility] Procedure:      VAS [place] LOWER EXTREMITY VENOUS (DVT) Referring Phys: [id number] [facility] Timoth Schara --------------------------------------------------------------------------------  Indications: Focal area of the lateral proximal calf with significant tenderness and edema.  Comparison Study: No prior study Performing Technologist: Maudry Mayhew MHA, RDMS, RVT, RDCS  Examination Guidelines: A complete evaluation includes B-mode imaging, spectral Doppler, color Doppler, and power Doppler as needed of all accessible portions of each vessel. Bilateral testing is considered an integral part of a complete examination. Limited examinations for reoccurring indications may be performed as noted. The reflux portion of the exam is performed with the patient in reverse Trendelenburg.  +-----+---------------+---------+-----------+----------+--------------+ RIGHTCompressibilityPhasicitySpontaneityPropertiesThrombus Aging +-----+---------------+---------+-----------+----------+--------------+ CFV  Full           Yes      Yes                                 +-----+---------------+---------+-----------+----------+--------------+   +---------+---------------+---------+-----------+----------+--------------+ LEFT     CompressibilityPhasicitySpontaneityPropertiesThrombus Aging +---------+---------------+---------+-----------+----------+--------------+ CFV      Full           Yes      Yes                                 +---------+---------------+---------+-----------+----------+--------------+ SFJ      Full                                                        +---------+---------------+---------+-----------+----------+--------------+ FV Prox  Full                                                        +---------+---------------+---------+-----------+----------+--------------+ FV Mid   Full                                                         +---------+---------------+---------+-----------+----------+--------------+ FV DistalFull                                                        +---------+---------------+---------+-----------+----------+--------------+  PFV      Full                                                        +---------+---------------+---------+-----------+----------+--------------+ POP      Full           Yes      Yes                                 +---------+---------------+---------+-----------+----------+--------------+ PTV      Full                                                        +---------+---------------+---------+-----------+----------+--------------+ PERO     Full                                                        +---------+---------------+---------+-----------+----------+--------------+ VV       None                    No                   Acute          +---------+---------------+---------+-----------+----------+--------------+     Summary: RIGHT: - No evidence of common femoral vein obstruction.  LEFT: - Findings consistent with acute superficial vein thrombosis involving a varicose vein in the left lateral proximal calf.  - There is no evidence of deep vein thrombosis in the lower extremity.  - No cystic structure found in the popliteal fossa.  *See table(s) above for measurements and observations.    Preliminary     Procedures Procedures   Medications Ordered in ED Medications - No data to display  ED Course  I have reviewed the triage vital signs and the nursing notes.  Pertinent labs & imaging results that were available during my care of the patient were reviewed by me and considered in my medical decision making (see chart for details).    MDM Rules/Calculators/A&P                          Patient presents to the emerge department today for left lower leg pain and edema for the last 9 days.  She states that this occurred after a fall, however I do  not suspect that this is from the fall.  Patient is distally neurovascularly intact, tenderness palpation of tibia-fibula, normal gait.  Patient does have an area of warmth and tenderness to this area, high suspicion for cellulitis with edema as well.  Will also obtain ultrasound imaging at this time to valuate for DVT, patient is on Pradaxa.  CBC without any leukocytosis, BMP pending.  Plain films negative.  Ultrasound does show superficial venous thrombosis, no DVT.  Hemeonc will be consulted since patient is from the picture and has been compliant with this.  Pt care was handed off to L.  Layden PA-C at 330.  Complete history and physical and current plan have been communicated.  Please refer to their note for the remainder of ED care and ultimate disposition.  Dispo depending on heme-onc. Final Clinical Impression(s) / ED Diagnoses Final diagnoses:  Left leg pain  Acute superficial venous thrombosis of left lower extremity    Rx / DC Orders ED Discharge Orders     None        Alfredia Client, PA-C 06/14/21 1552    Tegeler, Gwenyth Allegra, MD 06/15/21 1422

## 2021-06-14 NOTE — ED Provider Notes (Signed)
  Care assumed from [facility], PA-C at shift change with U/S findings pending.   In brief, this patient is a 55 year old female (currently on Pradaxa, ASA) who presents for evaluation of pain, swelling, redness noted to her left lower extremity.  She reports initially the symptoms began after she fell about 9 days ago.  She states that she feels like it is gotten worse.  She denies any fevers, chest pain, difficulty breathing.  Please see note from previous provider for full history/physical exam.  Physical Exam  BP (!) 150/86 (BP Location: Right Arm)   Pulse (!) 57   Temp 97.9 F (36.6 C) (Oral)   Resp 14   SpO2 99%   Physical Exam  ED Course/Procedures     Procedures  MDM   PLAN: Patient pending ultrasound.  If negative, can be discharged home.  MDM:  Update: Her ultrasound does not show any evidence of DVT.  She does have a superficial vein thrombosis involving a varicose vein in the left lateral proximal calf.  Discussed patient with Dr. Wyonia Hough (Oncology). Patient does not need additional anticoagulation at this time.  He encourages patient continue taking aspirin, Pradaxa.  I discussed results with patient.  Patient states she is compliant with her medications.  She denies any chest pain, difficulty breathing.  I discussed with her regarding at home supportive care measures, including anti-inflammatories, compression stockings. At this time, patient exhibits no emergent life-threatening condition that require further evaluation in ED. Patient had ample opportunity for questions and discussion. All patient's questions were answered with full understanding. Strict return precautions discussed. Patient expresses understanding and agreement to plan.    1. Acute superficial venous thrombosis of left lower extremity   2. Left leg pain     Portions of this note were generated with Dragon dictation software. Dictation errors may occur despite best attempts at proofreading.     Volanda Napoleon, PA-C 06/14/21 2307    Quintella Reichert, MD 06/15/21 2243

## 2021-06-14 NOTE — Discharge Instructions (Addendum)
As we discussed today, your ultrasound did show a superficial venous thrombosis.  As we discussed, this is not a DVT.  Continue taking your Pradaxa, aspirin.  You can take 1000 mg of Tylenol.  Do not exceed 4000 mg of Tylenol a day.  Follow the RICE (Rest, Ice, Compression, Elevation) protocol as directed.  Wear compression socks to help.  Follow-up with your primary care doctor.  Return to emergency department for any worsening pain, worsening redness, swelling or any other worsening or concerning symptoms.

## 2021-06-17 ENCOUNTER — Ambulatory Visit: Payer: MEDICAID | Admitting: Adult Health

## 2021-06-23 ENCOUNTER — Other Ambulatory Visit: Payer: Self-pay | Admitting: Obstetrics & Gynecology

## 2021-06-23 DIAGNOSIS — Z1231 Encounter for screening mammogram for malignant neoplasm of breast: Secondary | ICD-10-CM

## 2021-07-03 ENCOUNTER — Other Ambulatory Visit: Payer: Self-pay

## 2021-07-03 ENCOUNTER — Ambulatory Visit: Payer: Self-pay | Admitting: Family

## 2021-07-03 ENCOUNTER — Telehealth (HOSPITAL_COMMUNITY): Payer: Self-pay | Admitting: Pharmacy Technician

## 2021-07-03 MED FILL — Mexiletine HCl Cap 150 MG: ORAL | 30 days supply | Qty: 120 | Fill #2 | Status: AC

## 2021-07-03 NOTE — Telephone Encounter (Signed)
Advanced Heart Failure Patient Advocate Encounter  Received a message that the patient is almost out of Pradaxa and has yet to receive another shipment.  Patient is currently approved for [facility] through March 2023. Called and spoke with the patient, provided phone number to [facility] [phone].  Charlann Boxer, CPhT

## 2021-07-06 ENCOUNTER — Other Ambulatory Visit (HOSPITAL_COMMUNITY): Payer: Self-pay | Admitting: *Deleted

## 2021-07-06 ENCOUNTER — Other Ambulatory Visit: Payer: Self-pay | Admitting: Internal Medicine

## 2021-07-06 DIAGNOSIS — Z1231 Encounter for screening mammogram for malignant neoplasm of breast: Secondary | ICD-10-CM

## 2021-07-08 ENCOUNTER — Telehealth (HOSPITAL_COMMUNITY): Payer: Self-pay | Admitting: Pharmacy Technician

## 2021-07-08 NOTE — Telephone Encounter (Signed)
Advanced Heart Failure Patient Advocate Encounter  Patient called in stating that [facility] needs a new RX sent in order to refill Pradaxa. [facility] has not sent the office a refill request. Advised the patient that a new RX would be sent in the morning.  Charlann Boxer, CPhT

## 2021-07-09 ENCOUNTER — Other Ambulatory Visit (HOSPITAL_COMMUNITY): Payer: Self-pay | Admitting: *Deleted

## 2021-07-23 ENCOUNTER — Other Ambulatory Visit: Payer: Self-pay

## 2021-07-27 ENCOUNTER — Other Ambulatory Visit: Payer: Self-pay

## 2021-08-03 ENCOUNTER — Other Ambulatory Visit: Payer: Self-pay

## 2021-08-03 MED FILL — Hydralazine HCl Tab 100 MG: ORAL | 30 days supply | Qty: 90 | Fill #2 | Status: AC

## 2021-08-03 MED FILL — Mexiletine HCl Cap 150 MG: ORAL | 30 days supply | Qty: 120 | Fill #3 | Status: AC

## 2021-08-04 ENCOUNTER — Other Ambulatory Visit: Payer: Self-pay

## 2021-08-06 ENCOUNTER — Other Ambulatory Visit: Payer: Self-pay

## 2021-08-17 ENCOUNTER — Other Ambulatory Visit: Payer: Self-pay | Admitting: Internal Medicine

## 2021-08-17 DIAGNOSIS — Z1231 Encounter for screening mammogram for malignant neoplasm of breast: Secondary | ICD-10-CM

## 2021-08-18 ENCOUNTER — Ambulatory Visit
Admission: RE | Admit: 2021-08-18 | Discharge: 2021-08-18 | Disposition: A | Discharge status: Home / Self Care | Payer: No Typology Code available for payment source | Source: Ambulatory Visit | Attending: Internal Medicine | Admitting: Internal Medicine

## 2021-08-18 DIAGNOSIS — Z1231 Encounter for screening mammogram for malignant neoplasm of breast: Secondary | ICD-10-CM

## 2021-08-20 HISTORY — PX: EYE SURGERY: SHX253

## 2021-08-25 ENCOUNTER — Other Ambulatory Visit: Payer: Self-pay

## 2021-08-27 ENCOUNTER — Other Ambulatory Visit: Payer: Self-pay

## 2021-08-27 NOTE — Progress Notes (Signed)
[facility] Note  09/02/2021     CHIEF COMPLAINT Patient presents for Retina Evaluation   HISTORY OF PRESENT ILLNESS: Adrienne Lane is a 55 y.o. female who presents to the clinic today for:   HPI     Retina Evaluation   In left eye.  Duration of 6 months.  Context:  distance vision, mid-range vision and near vision.  Treatments tried include no treatments.  I, the attending physician,  performed the HPI with the patient and updated documentation appropriately.        Comments   Retina eval per Dr. Wyatt Portela for [facility] OS.  March 05, 2021 she noticed a decrease in vision OS.  2 months later she went to [facility] Dx cataract.  Went to Dr. Katy Fitch that said it was blood in the back of the eye OS.  She can see lights and shadows but nothing else OS.   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  She is now walking without a walker.   DM II since 1999, BS 121 this morning A1C 8.0      Last edited by Bernarda Caffey, MD on 09/02/2021  1:07 PM.    Pt is here on the referral of Dr. Katy Fitch for [facility] OS, pt states Dr. Katy Fitch told her she had blood in the back of her eye, pt states this has been going on since March, but is uninsured and had to go through [facility] for a referral, pt states she had a stroke in January 2022 on her left side, but has recovered, pt is on Pradaxa since her stroke, pt can only see light and shadows and has not improved since March, pt states she has occasional foggy vision and floaters in the right eye as well, pt has fol in OD 1-2x per week  Referring physician: Debbra Riding, MD [address] [place],  [place] [address]  HISTORICAL INFORMATION:   Selected notes from the [place] Referred by Dr. Wyatt Portela for concern of VH LEE:  Ocular Hx- PMH-    CURRENT MEDICATIONS: No current outpatient medications on file. (Ophthalmic Drugs)   No current facility-administered medications for this  visit. (Ophthalmic Drugs)   Current Outpatient Medications (Other)  Medication Sig   amLODipine (NORVASC) 10 MG tablet TAKE 1 TABLET (10 MG TOTAL) BY MOUTH DAILY.   aspirin 81 MG chewable tablet Chew 1 tablet (81 mg total) by mouth daily.   atorvastatin (LIPITOR) 80 MG tablet take 1 tablet (80 mg) by oral route once daily at bedtime for 90 days   carvedilol (COREG) 6.25 MG tablet TAKE 1 TABLET (6.25 MG TOTAL) BY MOUTH 2 (TWO) TIMES DAILY WITH A MEAL.   cholecalciferol (VITAMIN D3) 25 MCG (1000 UNIT) tablet Take 1,000 Units by mouth daily.   Coenzyme Q10 (Q-10 CO-ENZYME PO) Take 1 capsule by mouth daily in the afternoon.   dabigatran (PRADAXA) 150 MG CAPS capsule TAKE 1 CAPSULE (150 MG TOTAL) BY MOUTH EVERY 12 HOURS.   dapagliflozin propanediol (FARXIGA) 10 MG TABS tablet Take 1 tablet (10 mg total) by mouth daily before breakfast.   hydrALAZINE (APRESOLINE) 100 MG tablet TAKE 1 TABLET (100 MG TOTAL) BY MOUTH 3 (THREE) TIMES DAILY.   insulin aspart (NOVOLOG) 100 UNIT/ML injection Use as directed sliding scale insulin TID.   insulin glargine (LANTUS) 100 UNIT/ML injection INJECT 0.22 MLS (22 UNITS TOTAL) INTO THE SKIN  EVERY MORNING.   isosorbide mononitrate (IMDUR) 60 MG 24 hr tablet TAKE 1 TABLET (60 MG TOTAL) BY MOUTH DAILY.   mexiletine (MEXITIL) 150 MG capsule TAKE 2 CAPSULES (300 MG TOTAL) BY MOUTH EVERY 12 (TWELVE) HOURS.   Multiple Vitamins-Minerals (WOMENS MULTI PO) Take 1 tablet by mouth daily.   potassium chloride (KLOR-CON) 10 MEQ tablet Take 1 tablet (10 mEq total) by mouth daily.   sacubitril-valsartan (ENTRESTO) 24-26 MG Take 1 tablet by mouth 2 (two) times daily.   TRUEplus Lancets 28G MISC CHECK BLOOD GLUCOSE TWICE A DAY   glucose blood (TRUE METRIX BLOOD GLUCOSE TEST) test strip Use as instructed   Insulin Syringe-Needle U-100 (TRUEPLUS INSULIN SYRINGE) 30G X 5/16" 0.3 ML MISC USE AS DIRECTED 3 TIMES DAILY.   Insulin Syringe-Needle U-100 30G X 5/16" 0.5 ML MISC Use as directed.    Lancets (ONETOUCH ULTRASOFT) lancets Check CBG twice a day   No current facility-administered medications for this visit. (Other)      REVIEW OF SYSTEMS: ROS   Positive for: Neurological, Genitourinary, Endocrine, Cardiovascular, Eyes Negative for: Constitutional, Gastrointestinal, Skin, Musculoskeletal, HENT, Respiratory, Psychiatric, Allergic/Imm, Heme/Lymph Last edited by Leonie Douglas, COA on 09/02/2021  9:19 AM.       ALLERGIES Allergies  Allergen Reactions   Adhesive [Tape]     Tears skin, can tolerate paper tape    PAST MEDICAL HISTORY Past Medical History:  Diagnosis Date   Atrial fibrillation with RVR ([place])    Bigeminy 01/2020   CHF (congestive heart failure) ([place]) 10/20/2019   CKD (chronic kidney disease), stage IV (HCC)    Diabetes mellitus without complication ([place])    DOE (dyspnea on exertion)    walking upstairs or up hill resolves in one minute   Fibroids    History of kidney stones    History of recent blood transfusion 02/26/2020   Hypertension    Iron deficiency anemia    Non-ischemic cardiomyopathy ([place])    tachycardia induced   Obese    Peripheral edema    Premature ventricular contractions (PVCs) (VPCs)    Stroke ([place]) 01/5426   Umbilical hernia    Wears glasses    Past Surgical History:  Procedure Laterality Date   CHOLECYSTECTOMY     CYSTOSCOPY W/ URETERAL STENT PLACEMENT Bilateral 05/24/2020   Procedure: CYSTOSCOPY WITH RETROGRADE PYELOGRAM/URETERAL STENT PLACEMENT;  Surgeon: Robley Fries, MD;  Location: [facility];  Service: Urology;  Laterality: Bilateral;   CYSTOSCOPY/RETROGRADE/URETEROSCOPY Bilateral 04/03/2020   Procedure: CYSTOSCOPY/RETROGRADE/URETEROSCOPY;  Surgeon: Ardis Hughs, MD;  Location: [facility];  Service: Urology;  Laterality: Bilateral;   HYSTERECTOMY ABDOMINAL WITH SALPINGO-OOPHORECTOMY  07/21/2020   Procedure: HYSTERECTOMY ABDOMINAL WITH BILATERAL SALPINGO-OOPHORECTOMY;  Surgeon: Sanjuana Kava, MD;  Location: [place] [place];   Service: Gynecology;;   IR FLUORO GUIDE CV LINE RIGHT  02/21/2020   IR US GUIDE VASC ACCESS RIGHT  02/21/2020   NEPHROLITHOTOMY Left 02/14/2020   Procedure: NEPHROLITHOTOMY PERCUTANEOUS/ SURGEON ACCESS/ LEFT PERCUTANEOUS NEPHROSTOMY TUBE PLACEMENT;  Surgeon: Ardis Hughs, MD;  Location: [facility];  Service: Urology;  Laterality: Left;   NEPHROLITHOTOMY Left 02/21/2020   Procedure: NEPHROLITHOTOMY PERCUTANEOUS SECOND LOOK;  Surgeon: Ardis Hughs, MD;  Location: [facility];  Service: Urology;  Laterality: Left;   NEPHROLITHOTOMY Left 02/26/2020   Procedure: NEPHROLITHOTOMY PERCUTANEOUS;  Surgeon: Ceasar Mons, MD;  Location: [facility];  Service: Urology;  Laterality: Left;  NEED 150 MIN   NEPHROLITHOTOMY Right 03/24/2020   Procedure: NEPHROLITHOTOMY PERCUTANEOUS WITH ACCESS LEFT STENT REMOVAL;  Surgeon: Ardis Hughs, MD;  Location: [facility];  Service: Urology;  Laterality: Right;   RIGHT HEART CATH N/A 11/09/2019   Procedure: RIGHT HEART CATH;  Surgeon: Jolaine Artist, MD;  Location: [facility];  Service: Cardiovascular;  Laterality: N/A;   WISDOM TOOTH EXTRACTION      FAMILY HISTORY Family History  Problem Relation Age of Onset   Atrial fibrillation Mother    Hypertension Mother    Stroke Mother 47   Diabetes Mother    Diabetes Father    Hypertension Father    Diabetes Sister    Hypertension Sister    Diabetes Brother    Hypertension Brother    Diabetes Brother    Heart attack Brother    Stroke Maternal Grandmother 64    SOCIAL HISTORY Social History   Tobacco Use   Smoking status: Never   Smokeless tobacco: Never  Vaping Use   Vaping Use: Never used  Substance Use Topics   Alcohol use: Never   Drug use: Never         OPHTHALMIC EXAM:  Base Eye Exam     Visual Acuity (Snellen - Linear)       Right Left   Dist cc 20/30 +1 HM @face          Tonometry (Tonopen, 9:31 AM)       Right Left   Pressure 17 14         Pupils        Dark Light Shape React APD   Right 4 3 Round Brisk None   Left 4 5 Round Brisk appears +1  OS reacts to the light well.  During swinging light OS definitely went up- trace-1+ MG        Visual Fields (Counting fingers)       Left Right     Full   Restrictions Total superior temporal, inferior temporal, superior nasal, inferior nasal deficiencies          Extraocular Movement       Right Left    Full Full         Neuro/Psych     Oriented x3: Yes   Mood/Affect: Normal         Dilation     Both eyes: 1.0% Mydriacyl, 2.5% Phenylephrine @ 9:31 AM           Slit Lamp and Fundus Exam     External Exam       Right Left   External Normal Normal         Slit Lamp Exam       Right Left   Lids/Lashes Dermatochalasis - upper lid Dermatochalasis - upper lid   Conjunctiva/Sclera Melanosis Melanosis   Cornea mild arcus mild arcus   Anterior Chamber Deep and quiet Deep and quiet   Iris Round and dilated, No NVI Round and dilated, No NVI   Lens 1-2+ Nuclear sclerosis, 1-2+ Cortical cataract, fine pigment deposition on posterior capsule 2+ Nuclear sclerosis, 2-3+ Cortical cataract, +RBC on posterior capsule   Vitreous Vitreous syneresis, fine pigment v RBC Diffuse VH         Fundus Exam       Right Left   Disc Pink and Sharp, +fibrosis and NVD, mild PPP no details visible   Macula Flat, Blunted foveal reflex, scattered MA, RPE mottling no details visible   Vessels attenuated, Tortuous, peripheral sclerosis Normal   Periphery Attached, scattered patches of fibrosis and NVD, tiny  flap tear at 0130 equator, no SRF, old VH settled inferiorly    No details visible           Refraction     Wearing Rx       Sphere Cylinder Axis   Right -2.50 +1.75 178   Left -2.50 +1.25 018         Manifest Refraction       Sphere Cylinder Axis Dist VA   Right -2.75 +1.25 175 20/25   Left                IMAGING AND PROCEDURES  Imaging and Procedures for  09/02/2021  OCT, Retina - OU - Both Eyes       Right Eye Quality was good. Central Foveal Thickness: 218. Progression has no prior data. Findings include normal foveal contour, no IRF, no SRF, intraretinal hyper-reflective material (Trace cystic changes, partial PVD and disc fibrosis visible on widefield).   Left Eye Findings include (No readable image obtained).   Notes *Images captured and stored on drive  Diagnosis / Impression:    Clinical management:  See below  Abbreviations: NFP - Normal foveal profile. CME - cystoid macular edema. PED - pigment epithelial detachment. IRF - intraretinal fluid. SRF - subretinal fluid. EZ - ellipsoid zone. ERM - epiretinal membrane. ORA - outer retinal atrophy. ORT - outer retinal tubulation. SRHM - subretinal hyper-reflective material. IRHM - intraretinal hyper-reflective material            ASSESSMENT/PLAN:    ICD-10-CM   1. 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 Vitreous hemorrhage of left eye (HCC)  H43.12     3. Traction detachment of left retina  H33.42     4. Retinal edema  H35.81 OCT, Retina - OU - Both Eyes    5. Retinal tear of right eye  H33.311     6. Essential hypertension  I10     7. Hypertensive retinopathy of both eyes  H35.033     8. Combined forms of age-related cataract of both eyes  H25.813     1-4. Proliferative diabetic retinopathy OU  - OD: w/ +NVD  - OS: with chronic, diffuse VH and underlying TRD - The incidence, risk factors for progression, natural history and treatment options for diabetic retinopathy were discussed with patient.   - The need for close monitoring of blood glucose, blood pressure, and serum lipids, avoiding cigarette or any type of tobacco, and the need for long term follow up was also discussed with patient. - exam shows +NVD and scattered fibrosis OD, OS no view posteriorly due to dense  VH - bscan OS 9.14.22 shows diffuse VH OS, hyperechoic membranes, probable TRD - OCT without diabetic macular edema - discussed findings and prognosis - OS will need surgery (PPV w/ membrane peel, endolaser, gas vs oil) -- discussed with Dr. Katy Fitch re: possible cataract surgery prior to retina surgery - OD will need PRP for treatment of PDR w/ NVD - f/u Monday at 245 for PRP OD  5. Retinal tear, OD - The incidence, risk factors, and natural history of retinal tear was discussed with patient.   - Potential treatment options including laser retinopexy and cryotherapy discussed with patient. - small flap tear located at 0130 equator, no SRF - f/u Monday at 245 for laser retinopexy along with PRP OD  6,7. Hypertensive retinopathy OU - discussed importance of  tight BP control - monitor  8. Mixed Cataract OU - The symptoms of cataract, surgical options, and treatments and risks were discussed with patient. - discussed diagnosis and progression - under the expert management of Dr. Wyatt Portela - discussed possible CEIOL OS prior to retina surgery   Ophthalmic Meds Ordered this visit:  No orders of the defined types were placed in this encounter.     Return for f/u Monday, PDR OU, DFE, OCT, possible laser.  There are no Patient Instructions on file for this visit.  This document serves as a record of services personally performed by Gardiner Sleeper, MD, PhD. It was created on their behalf by Orvan Falconer, an ophthalmic technician. The creation of this record is the provider's dictation and/or activities during the visit.    Electronically signed by: Orvan Falconer, OA, 09/02/21  1:08 PM   This document serves as a record of services personally performed by Gardiner Sleeper, MD, PhD. It was created on their behalf by San Jetty. Owens Shark, OA an ophthalmic technician. The creation of this record is the provider's dictation and/or activities during the visit.    Electronically signed by:  San Jetty. Owens Shark, [place] 09.14.2022 1:08 PM   Explained the diagnoses, plan, and follow up with the patient and they expressed understanding.  Patient expressed understanding of the importance of proper follow up care.   Gardiner Sleeper, M.D., Ph.D. Diseases & Surgery of the Retina and Vitreous [facility]  I have reviewed the above documentation for accuracy and completeness, and I agree with the above. Gardiner Sleeper, M.D., Ph.D. 09/02/21 1:24 PM   Abbreviations: M myopia (nearsighted); A astigmatism; H hyperopia (farsighted); P presbyopia; Mrx spectacle prescription;  CTL contact lenses; OD right eye; OS left eye; OU both eyes  XT exotropia; ET esotropia; PEK punctate epithelial keratitis; PEE punctate epithelial erosions; DES dry eye syndrome; MGD meibomian gland dysfunction; ATs artificial tears; PFAT's preservative free artificial tears; [place] nuclear sclerotic cataract; PSC posterior subcapsular cataract; ERM epi-retinal membrane; PVD posterior vitreous detachment; RD retinal detachment; DM diabetes mellitus; DR diabetic retinopathy; NPDR non-proliferative diabetic retinopathy; PDR proliferative diabetic retinopathy; CSME clinically significant macular edema; DME diabetic macular edema; dbh dot blot hemorrhages; CWS cotton wool spot; POAG primary open angle glaucoma; C/D cup-to-disc ratio; HVF humphrey visual field; GVF goldmann visual field; OCT optical coherence tomography; IOP intraocular pressure; BRVO Branch retinal vein occlusion; CRVO central retinal vein occlusion; CRAO central retinal artery occlusion; BRAO branch retinal artery occlusion; RT retinal tear; SB scleral buckle; PPV pars plana vitrectomy; VH Vitreous hemorrhage; PRP panretinal laser photocoagulation; IVK intravitreal kenalog; VMT vitreomacular traction; MH Macular hole;  NVD neovascularization of the disc; NVE neovascularization elsewhere; AREDS age related eye disease study; ARMD age related macular  degeneration; POAG primary open angle glaucoma; EBMD epithelial/anterior basement membrane dystrophy; ACIOL anterior chamber intraocular lens; IOL intraocular lens; PCIOL posterior chamber intraocular lens; Phaco/IOL phacoemulsification with intraocular lens placement; [place] photorefractive keratectomy; LASIK laser assisted in situ keratomileusis; HTN hypertension; DM diabetes mellitus; COPD chronic obstructive pulmonary disease

## 2021-08-31 ENCOUNTER — Other Ambulatory Visit: Payer: Self-pay

## 2021-09-01 ENCOUNTER — Encounter: Payer: Self-pay | Admitting: Adult Health

## 2021-09-01 ENCOUNTER — Ambulatory Visit: Payer: Self-pay | Admitting: Adult Health

## 2021-09-01 ENCOUNTER — Encounter (INDEPENDENT_AMBULATORY_CARE_PROVIDER_SITE_OTHER): Payer: MEDICAID | Admitting: Ophthalmology

## 2021-09-01 ENCOUNTER — Other Ambulatory Visit: Payer: Self-pay

## 2021-09-01 VITALS — BP 123/71 | HR 67 | Ht 68.0 in | Wt 238.0 lb

## 2021-09-01 DIAGNOSIS — I480 Paroxysmal atrial fibrillation: Secondary | ICD-10-CM

## 2021-09-01 DIAGNOSIS — E1165 Type 2 diabetes mellitus with hyperglycemia: Secondary | ICD-10-CM

## 2021-09-01 DIAGNOSIS — E785 Hyperlipidemia, unspecified: Secondary | ICD-10-CM

## 2021-09-01 DIAGNOSIS — I6350 Cerebral infarction due to unspecified occlusion or stenosis of unspecified cerebral artery: Secondary | ICD-10-CM

## 2021-09-01 DIAGNOSIS — G4733 Obstructive sleep apnea (adult) (pediatric): Secondary | ICD-10-CM

## 2021-09-01 DIAGNOSIS — I10 Essential (primary) hypertension: Secondary | ICD-10-CM

## 2021-09-01 NOTE — Patient Instructions (Signed)
Continue aspirin 81 mg daily and Pradaxa (dabigatran) twice a day  and atorvastatin for secondary stroke prevention  Continue to follow up with PCP regarding cholesterol, blood pressure and diabetes management as well as routine follow-up with cardiology Maintain strict control of hypertension with blood pressure goal below 130/90, diabetes with hemoglobin A1c goal below 7% and cholesterol with LDL cholesterol (bad cholesterol) goal below 70 mg/dL.   Apply for [facility] financial assistance - if approved, please let me know so I can place referral to our sleep clinic for further evaluation of sleep apnea     Followup in the future with me in 6 months or call earlier if needed       Thank you for coming to see [place] at [facility]. I hope we have been able to provide you high quality care today.  You may receive a patient satisfaction survey over the next few weeks. We would appreciate your feedback and comments so that we may continue to improve ourselves and the health of our patients.

## 2021-09-01 NOTE — Progress Notes (Signed)
I agree with the above plan 

## 2021-09-01 NOTE — Progress Notes (Signed)
[facility] [address] [place]. [place] [address] [phone]       STROKE FOLLOW UP NOTE  [name] Date of Birth:  11-13-1966 Medical Record Number:  [id number]   Reason for Referral: stroke follow up    SUBJECTIVE:   CHIEF COMPLAINT:  Chief Complaint  Patient presents with   Stroke    Rm 3, 6 month FU "still have fatigue, not as many episodes of lightheadedness; my equilibrium gets off sometimes when walking, one fall"      HPI:   Today, 09/01/2021, Adrienne Lane returns for stroke follow-up after prior visit 6 months ago.  Completed PT/OT in 03/2021 reporting residual occasional imbalance and lightheadedness but overall improving.  Also continues to complain of fatigue.  Reports improvement of memory. She is ambulating without assistive device.  She did present to ED 03/09/2021 with blurred vision left eye and worsening left hand weakness. MR brain negative for acute stroke and all lab work stable compared to baseline. Continues to have limited vision of OS. Was seen by ophthalmologist Dr. Katy Fitch and told "blood" in her eye (unable to personally view via epic) and referred to retina specialist with initial visit scheduled tomorrow. Also has occasional right eye floaters. Denies new stroke/TIA symptoms since that time.  Compliant with aspirin and Pradaxa as well as atorvastatin.  Blood pressures today 123/71.  Recent A1c 8.0.  PCP increased Lantus dose as well as continuation of NovoLog and [place].  She was found to have LLE superficial clot on 06/14/2021 with a mechanical fall 8 days prior.  Hematology eval in ED without change to regimen with aspirin and Pradaxa.  She routinely follows with cardiology for atrial fibrillation and CHF. She has not yet been approved for medicaid as she is still pending disability.  No further concerns at this time.    History provided for reference purposes only Initial visit 03/03/2021 JM: Adrienne Lane is being seen for  hospital follow-up unaccompanied.  Reports residual left sided weakness, left shoulder pain, lightheadedness/dizziness, occasional blurred vision (especialy when fatigued), short term memory loss and delayed word finding difficulty.  Also complains of excessive daytime fatigue Reported dizziness/lightheadedness appears to be more closely related to imbalance and disequilibrium -reports good days and bad days Use of cane outside of home otherwise no AD in the home Currently working with neuro rehab PT/OT Denies new stroke/TIA symptoms  She was screened for sleep apnea during admission as part of sleep smart study where she was found to have sleep apnea but had difficulty tolerating full face mask She has not pursued outpatient sleep study as she is currently insured - currently medicaid pending   Compliant on aspirin and Pradaxa -denies side effects Compliant on atorvastatin 80 mg daily -denies side effects Blood pressure today 121/73 - monitors at home typically ranging 130s/80-90s Glucose levels stable typically 120s-130s occasionally fluctuates  Plans on f/u with PCP next month for repeat lab work  No further concerns at this time   Stroke admission 12/24/2020 Adrienne Lane is a 55 y.o. female with PMHx of DM, Afib on Eliquis, CKD stage 4, HFpEF who presented to [facility] ED on 12/24/2020 with floaters on OU in peripheral vision along with LUE & LLE weakness and left facial droop.  Personally reviewed hospitalization pertinent progress notes, lab work and imaging with summary provided.  Evaluated by Dr. Leonie Man with stroke work-up revealing right pontine lacunar infarct likely secondary from small vessel disease as well as additional small acute infarct in  the left frontal subcortical white matter and small remote lacunar infarcts in the right corona radiata and left parietal white matter.  History of atrial fibrillation compliant on Eliquis and aspirin 81 mg daily with recommended continuation of  aspirin 81 mg daily and transition Eliquis to Pradaxa 150 mg twice daily. Hx of HTN stable on amlodipine, carvedilol and hydralazine.  History of HLD on atorvastatin 40 mg daily with LDL 173 therefore increased dosage to 80 mg daily.  Uncontrolled DM with A1c 8.0.  Other stroke risk factors include obesity, CAD, OSA (evaluated by sleep smart trial) and CHF.  Evaluated by therapies who recommended discharge to CIR for ongoing therapy needs on 12/27/2020.  On 01/03/2021 during CIR admission, found to have COVID with low-grade fever, cough and sore throat therefore discharged to acute services for ongoing care and treatment.  She was eventually discharged home on 01/06/2021 (as all family members in household positive for COVID and mild symptoms) as she was unable to return back to CIR unless she had a negative Covid test (which could take 14-21 days).   Stroke - right pontine lacunar infarct likely from small vessel disease, the patient has a history of atrial fibrillation and was compliant with her Eliquis CT Head: chronic microvascular ischemic disease or age or indeterminate lacunar infarct, Additional age indeterminate lacunar, infarct within the right corona radiata, favored remote MRI : Acute infarct in the right hemipons. Additional small acute infarct in the left frontal subcortical white matter. Small remote lacunar infarcts in the right corona radiata and left periatrial white matter. MR angio Head - neg Carotid US unremarkable 2D Echo: EF 45 to 50%. LDL 173 HgbA1c 8.0 VTE prophylaxis - On pradaxa Eliquis (apixaban) daily prior to admission, now on ASA 81 and Pradaxa 150 mg BID. Continue on discharge Therapy recommendations:  CIR Disposition:  CIR      ROS:   14 system review of systems performed and negative with exception of those listed in HPI  PMH:  Past Medical History:  Diagnosis Date   Atrial fibrillation with RVR ([place])    [place] 01/2020   CHF (congestive heart failure) ([place])  10/20/2019   CKD (chronic kidney disease), stage IV (HCC)    Diabetes mellitus without complication ([place])    DOE (dyspnea on exertion)    walking upstairs or up hill resolves in one minute   Fibroids    History of kidney stones    History of recent blood transfusion 02/26/2020   Hypertension    Iron deficiency anemia    Non-ischemic cardiomyopathy (HCC)    tachycardia induced   Obese    Peripheral edema    Premature ventricular contractions (PVCs) ([facility])    Stroke ([place]) 61/6073   Umbilical hernia    Wears glasses     PSH:  Past Surgical History:  Procedure Laterality Date   CHOLECYSTECTOMY     CYSTOSCOPY W/ URETERAL STENT PLACEMENT Bilateral 05/24/2020   Procedure: CYSTOSCOPY WITH RETROGRADE PYELOGRAM/URETERAL STENT PLACEMENT;  Surgeon: Robley Fries, MD;  Location: [facility];  Service: Urology;  Laterality: Bilateral;   CYSTOSCOPY/RETROGRADE/URETEROSCOPY Bilateral 04/03/2020   Procedure: CYSTOSCOPY/RETROGRADE/URETEROSCOPY;  Surgeon: Ardis Hughs, MD;  Location: [facility];  Service: Urology;  Laterality: Bilateral;   HYSTERECTOMY ABDOMINAL WITH SALPINGO-OOPHORECTOMY  07/21/2020   Procedure: HYSTERECTOMY ABDOMINAL WITH BILATERAL SALPINGO-OOPHORECTOMY;  Surgeon: Sanjuana Kava, MD;  Location: [facility];  Service: Gynecology;;   IR FLUORO GUIDE CV LINE RIGHT  02/21/2020   IR US GUIDE VASC ACCESS RIGHT  02/21/2020   NEPHROLITHOTOMY Left 02/14/2020   Procedure: NEPHROLITHOTOMY PERCUTANEOUS/ SURGEON ACCESS/ LEFT PERCUTANEOUS NEPHROSTOMY TUBE PLACEMENT;  Surgeon: Ardis Hughs, MD;  Location: [facility];  Service: Urology;  Laterality: Left;   NEPHROLITHOTOMY Left 02/21/2020   Procedure: NEPHROLITHOTOMY PERCUTANEOUS SECOND LOOK;  Surgeon: Ardis Hughs, MD;  Location: [facility];  Service: Urology;  Laterality: Left;   NEPHROLITHOTOMY Left 02/26/2020   Procedure: NEPHROLITHOTOMY PERCUTANEOUS;  Surgeon: Ceasar Mons, MD;  Location: [facility];  Service: Urology;  Laterality: Left;  NEED 150  MIN   NEPHROLITHOTOMY Right 03/24/2020   Procedure: NEPHROLITHOTOMY PERCUTANEOUS WITH ACCESS LEFT STENT REMOVAL;  Surgeon: Ardis Hughs, MD;  Location: [facility];  Service: Urology;  Laterality: Right;   RIGHT HEART CATH N/A 11/09/2019   Procedure: RIGHT HEART CATH;  Surgeon: Jolaine Artist, MD;  Location: [facility];  Service: Cardiovascular;  Laterality: N/A;   WISDOM TOOTH EXTRACTION      Social History:  Social History   Socioeconomic History   Marital status: Married    Spouse name: Not on file   Number of children: 0   Years of education: Not on file   Highest education level: Associate degree: occupational, Hotel manager, or vocational program  Occupational History   Occupation: unemployed  Tobacco Use   Smoking status: Never   Smokeless tobacco: Never  Vaping Use   Vaping Use: Never used  Substance and Sexual Activity   Alcohol use: Never   Drug use: Never   Sexual activity: Yes    Birth control/protection: None  Other Topics Concern   Not on file  Social History Narrative   Not on file   Social Determinants of Health   Financial Resource Strain: Not on file  Food Insecurity: Not on file  Transportation Needs: Not on file  Physical Activity: Not on file  Stress: Not on file  Social Connections: Not on file  Intimate Partner Violence: Not on file    Family History:  Family History  Problem Relation Age of Onset   Atrial fibrillation Mother    Hypertension Mother    Stroke Mother 55   Diabetes Mother    Diabetes Father    Hypertension Father    Diabetes Sister    Hypertension Sister    Diabetes Brother    Hypertension Brother    Diabetes Brother    Heart attack Brother    Stroke Maternal Grandmother 45    Medications:   Current Outpatient Medications on File Prior to Visit  Medication Sig Dispense Refill   amLODipine (NORVASC) 10 MG tablet TAKE 1 TABLET (10 MG TOTAL) BY MOUTH DAILY. 30 tablet 6   aspirin 81 MG chewable tablet Chew 1  tablet (81 mg total) by mouth daily. 360 tablet 0   atorvastatin (LIPITOR) 80 MG tablet take 1 tablet (80 mg) by oral route once daily at bedtime for 90 days 90 tablet 3   carvedilol (COREG) 6.25 MG tablet TAKE 1 TABLET (6.25 MG TOTAL) BY MOUTH 2 (TWO) TIMES DAILY WITH A MEAL. 60 tablet 6   cholecalciferol (VITAMIN D3) 25 MCG (1000 UNIT) tablet Take 1,000 Units by mouth daily.     Coenzyme Q10 (Q-10 CO-ENZYME PO) Take 1 capsule by mouth daily in the afternoon.     dabigatran (PRADAXA) 150 MG CAPS capsule TAKE 1 CAPSULE (150 MG TOTAL) BY MOUTH EVERY 12 HOURS. 60 capsule 3   dapagliflozin propanediol (FARXIGA) 10 MG TABS tablet Take 1 tablet (10 mg total) by  mouth daily before breakfast. 30 tablet 6   glucose blood (TRUE METRIX BLOOD GLUCOSE TEST) test strip Use as instructed 100 each 2   hydrALAZINE (APRESOLINE) 100 MG tablet TAKE 1 TABLET (100 MG TOTAL) BY MOUTH 3 (THREE) TIMES DAILY. 90 tablet 11   insulin aspart (NOVOLOG) 100 UNIT/ML injection Use as directed sliding scale insulin TID. 10 mL PRN   insulin glargine (LANTUS) 100 UNIT/ML injection INJECT 0.22 MLS (22 UNITS TOTAL) INTO THE SKIN EVERY MORNING. 10 mL 2   Insulin Syringe-Needle U-100 (TRUEPLUS INSULIN SYRINGE) 30G X 5/16" 0.3 ML MISC USE AS DIRECTED 3 TIMES DAILY. 90 each 1   Insulin Syringe-Needle U-100 30G X 5/16" 0.5 ML MISC Use as directed. 100 each 11   isosorbide mononitrate (IMDUR) 60 MG 24 hr tablet TAKE 1 TABLET (60 MG TOTAL) BY MOUTH DAILY. 30 tablet 6   Lancets (ONETOUCH ULTRASOFT) lancets Check CBG twice a day 60 each 5   mexiletine (MEXITIL) 150 MG capsule TAKE 2 CAPSULES (300 MG TOTAL) BY MOUTH EVERY 12 (TWELVE) HOURS. 120 capsule 6   Multiple Vitamins-Minerals (WOMENS MULTI PO) Take 1 tablet by mouth daily.     potassium chloride (KLOR-CON) 10 MEQ tablet Take 1 tablet (10 mEq total) by mouth daily. 30 tablet 6   sacubitril-valsartan (ENTRESTO) 24-26 MG Take 1 tablet by mouth 2 (two) times daily. 60 tablet 11   TRUEplus  Lancets 28G MISC CHECK BLOOD GLUCOSE TWICE A DAY 60 each 5   [DISCONTINUED] ELIQUIS 5 MG TABS tablet TAKE 1 TABLET (5 MG TOTAL) BY MOUTH 2 (TWO) TIMES DAILY. 60 tablet 6   [DISCONTINUED] potassium chloride (KLOR-CON) 10 MEQ tablet Take 1 tablet (10 mEq total) by mouth daily. 30 tablet 6   [DISCONTINUED] torsemide (DEMADEX) 20 MG tablet TAKE 2 TABLETS (40 MG TOTAL) BY MOUTH 2 (TWO) TIMES DAILY. 120 tablet 6   No current facility-administered medications on file prior to visit.    Allergies:   Allergies  Allergen Reactions   Adhesive [Tape]     Tears skin, can tolerate paper tape      OBJECTIVE:  Physical Exam  Vitals:   09/01/21 0727  BP: 123/71  Pulse: 67  Weight: 238 lb (108 kg)  Height: 5\' 8"  (1.727 m)    Body mass index is 36.19 kg/m. No results found.  General: well developed, well nourished,  pleasant middle-aged African-American female, seated, in no evident distress Head: head normocephalic and atraumatic.   Neck: supple with no carotid or supraclavicular bruits Cardiovascular: irregular rate and rhythm, no murmurs Musculoskeletal: no deformity Skin:  no rash/petichiae Vascular:  Normal pulses all extremities   Neurologic Exam Mental Status: Awake and fully alert. Fluent speech and language. Oriented to place and time. Recent and remote memory intact. Attention span, concentration and fund of knowledge appropriate during visit. Mood and affect appropriate.  Cranial Nerves: Pupils equal, briskly reactive to light. Extraocular movements full without nystagmus. Visual fields full to confrontation. Hearing intact. Facial sensation intact. Face, tongue, palate moves normally and symmetrically.  Motor: Normal strength, bulk and tone on right side. LUE: 4+/5 with limited finger flexion LLE 4/5 hip flexor otherwise 4+-5-/5 Sensory.: intact to touch , pinprick , position and vibratory sensation.  Coordination: Rapid alternating movements normal in all extremities except  decreased left hand. Finger-to-nose and heel-to-shin performed accurately on right side.  Orbits right arm over left arm Gait and Station: Arises from chair with mild difficulty. Stance is normal. Gait demonstrates  broad-based gait with  slight decreased step height LLE without use of assistive device.   Reflexes: 1+ and symmetric. Toes downgoing.         ASSESSMENT: Adrienne Lane is a 55 y.o. year old female presented with left hemiparesis, left facial droop and bilateral floaters and peripheral vision on 12/24/2020 with stroke work-up revealing right pontine lacunar infarct likely secondary to small vessel disease as well as additional small acute infarct in the left frontal subcortical white matter and chronic right CR and left parietal white matter infarcts. Vascular risk factors include prior strokes on imaging, HTN, HLD, DM, A. Fib, CAD, OSA and CHF.      PLAN:  R pontine stroke :  Residual deficit: Mild left hemiparesis, and mild gait impairment but overall recovering.  Discussed importance of routine HEP and increase physical activity and routine exercise Continue aspirin 81 mg daily and Pradaxa (dabigatran) twice a day  and atorvastatin 80 mg daily for secondary stroke prevention.   Discussed secondary stroke prevention measures and importance of close PCP follow up for aggressive stroke risk factor management  Atrial fibrillation: On Pradaxa with CHA2DS2-VASc score of at least 6. Stroke occurred while on Eliquis.  Followed routinely by cardiology HTN: BP goal <130/90.  Stable on amlodipine, hydralazine, carvedilol and Imdur per PCP/cardiology HLD: LDL goal <70.  Prior LDL 173 (12/2020).  Needs repeat lipid panel -unable to obtain today as not currently fasting.  Request follow-up with PCP to obtain labs.  Continue atorvastatin 80 mg daily.   DMII: A1c goal<7.0. Recent A1c 8.0 (05/22/2021).  On farxiga, NovoLog and Lantus per PCP OSA: Likely contributing to continued fatigue.  Medicaid  remains pending until disability approved -information for [employer] assistance program provided.  She was advised to call office if she qualifies and able to proceed with sleep referral     Follow up in 6 months or call earlier if needed    CC:  [place] provider: Dr. Leonie Man Medicine, [facility]    I spent 38 minutes of face-to-face and non-face-to-face time with patient.  This included previsit chart review, lab review, study review, electronic health record documentation, patient education regarding prior stroke and etiology, importance of managing stroke risk factors and secondary stroke prevention measures, residual stroke deficits and potential further recovery and answered all other questions to patient satisfaction  Frann Rider, [facility]  [facility] [address] [place], [place] [address]  Phone [phone] Fax [phone] Note: This document was prepared with digital dictation and possible smart phrase technology. Any transcriptional errors that result from this process are unintentional.

## 2021-09-02 ENCOUNTER — Encounter (INDEPENDENT_AMBULATORY_CARE_PROVIDER_SITE_OTHER): Payer: Self-pay | Admitting: Ophthalmology

## 2021-09-02 ENCOUNTER — Ambulatory Visit (INDEPENDENT_AMBULATORY_CARE_PROVIDER_SITE_OTHER): Admitting: Ophthalmology

## 2021-09-02 ENCOUNTER — Other Ambulatory Visit: Payer: Self-pay

## 2021-09-02 DIAGNOSIS — H35033 Hypertensive retinopathy, bilateral: Secondary | ICD-10-CM | POA: Insufficient documentation

## 2021-09-02 DIAGNOSIS — H25813 Combined forms of age-related cataract, bilateral: Secondary | ICD-10-CM | POA: Insufficient documentation

## 2021-09-02 DIAGNOSIS — E113593 Type 2 diabetes mellitus with proliferative diabetic retinopathy without macular edema, bilateral: Secondary | ICD-10-CM | POA: Insufficient documentation

## 2021-09-02 DIAGNOSIS — H3342 Traction detachment of retina, left eye: Secondary | ICD-10-CM

## 2021-09-02 DIAGNOSIS — H4312 Vitreous hemorrhage, left eye: Secondary | ICD-10-CM | POA: Diagnosis not present

## 2021-09-02 DIAGNOSIS — H33311 Horseshoe tear of retina without detachment, right eye: Secondary | ICD-10-CM | POA: Diagnosis not present

## 2021-09-02 DIAGNOSIS — I10 Essential (primary) hypertension: Secondary | ICD-10-CM

## 2021-09-02 DIAGNOSIS — H3581 Retinal edema: Secondary | ICD-10-CM

## 2021-09-04 NOTE — Progress Notes (Signed)
[facility] Note  09/07/2021     CHIEF COMPLAINT Patient presents for Retina Follow Up   HISTORY OF PRESENT ILLNESS: Adrienne Lane is a 55 y.o. female who presents to the clinic today for:  HPI     Retina Follow Up   Patient presents with  Diabetic Retinopathy.  In both eyes.  This started weeks ago.  Severity is moderate.  Duration of weeks.  Since onset it is stable.  I, the attending physician,  performed the HPI with the patient and updated documentation appropriately.        Comments   Pt states vision is the same OU.  Pt denies eye pain or discomfort.  Pt denies any new or worsening floaters or fol OU.      Last edited by Bernarda Caffey, MD on 09/07/2021  5:09 PM.    Pt here today for PRP OD  Referring physician: Debbra Riding, MD [address] [place],  [place] [address]  HISTORICAL INFORMATION:   Selected notes from the MEDICAL RECORD NUMBER Referred by Dr. Wyatt Portela for concern of VH   CURRENT MEDICATIONS: No current outpatient medications on file. (Ophthalmic Drugs)   No current facility-administered medications for this visit. (Ophthalmic Drugs)   Current Outpatient Medications (Other)  Medication Sig   amLODipine (NORVASC) 10 MG tablet TAKE 1 TABLET (10 MG TOTAL) BY MOUTH DAILY.   aspirin 81 MG chewable tablet Chew 1 tablet (81 mg total) by mouth daily.   atorvastatin (LIPITOR) 80 MG tablet take 1 tablet (80 mg) by oral route once daily at bedtime for 90 days   carvedilol (COREG) 6.25 MG tablet TAKE 1 TABLET (6.25 MG TOTAL) BY MOUTH 2 (TWO) TIMES DAILY WITH A MEAL.   cholecalciferol (VITAMIN D3) 25 MCG (1000 UNIT) tablet Take 1,000 Units by mouth daily.   Coenzyme Q10 (Q-10 CO-ENZYME PO) Take 1 capsule by mouth daily in the afternoon.   dabigatran (PRADAXA) 150 MG CAPS capsule TAKE 1 CAPSULE (150 MG TOTAL) BY MOUTH EVERY 12 HOURS.   dapagliflozin propanediol (FARXIGA) 10 MG TABS tablet Take 1 tablet (10 mg total) by  mouth daily before breakfast.   glucose blood (TRUE METRIX BLOOD GLUCOSE TEST) test strip Use as instructed   hydrALAZINE (APRESOLINE) 100 MG tablet TAKE 1 TABLET (100 MG TOTAL) BY MOUTH 3 (THREE) TIMES DAILY.   insulin aspart (NOVOLOG) 100 UNIT/ML injection Use as directed sliding scale insulin TID.   insulin glargine (LANTUS) 100 UNIT/ML injection INJECT 0.22 MLS (22 UNITS TOTAL) INTO THE SKIN EVERY MORNING.   Insulin Syringe-Needle U-100 (TRUEPLUS INSULIN SYRINGE) 30G X 5/16" 0.3 ML MISC USE AS DIRECTED 3 TIMES DAILY.   Insulin Syringe-Needle U-100 30G X 5/16" 0.5 ML MISC Use as directed.   isosorbide mononitrate (IMDUR) 60 MG 24 hr tablet TAKE 1 TABLET (60 MG TOTAL) BY MOUTH DAILY.   Lancets (ONETOUCH ULTRASOFT) lancets Check CBG twice a day   mexiletine (MEXITIL) 150 MG capsule TAKE 2 CAPSULES (300 MG TOTAL) BY MOUTH EVERY 12 (TWELVE) HOURS.   Multiple Vitamins-Minerals (WOMENS MULTI PO) Take 1 tablet by mouth daily.   potassium chloride (KLOR-CON) 10 MEQ tablet Take 1 tablet (10 mEq total) by mouth daily.   sacubitril-valsartan (ENTRESTO) 24-26 MG Take 1 tablet by mouth 2 (two) times daily.   TRUEplus Lancets 28G MISC CHECK BLOOD GLUCOSE TWICE A DAY   No current facility-administered medications for this visit. (Other)      REVIEW  OF SYSTEMS: ROS   Positive for: Neurological, Genitourinary, Endocrine, Cardiovascular, Eyes Negative for: Constitutional, Gastrointestinal, Skin, Musculoskeletal, HENT, Respiratory, Psychiatric, Allergic/Imm, Heme/Lymph Last edited by Doneen Poisson on 09/07/2021  3:01 PM.        ALLERGIES Allergies  Allergen Reactions   Adhesive [Tape]     Tears skin, can tolerate paper tape    PAST MEDICAL HISTORY Past Medical History:  Diagnosis Date   Atrial fibrillation with RVR ([place])    Bigeminy 01/2020   CHF (congestive heart failure) ([place]) 10/20/2019   CKD (chronic kidney disease), stage IV (HCC)    Diabetes mellitus without complication ([place])     DOE (dyspnea on exertion)    walking upstairs or up hill resolves in one minute   Fibroids    History of kidney stones    History of recent blood transfusion 02/26/2020   Hypertension    Iron deficiency anemia    Non-ischemic cardiomyopathy ([place])    tachycardia induced   Obese    Peripheral edema    Premature ventricular contractions (PVCs) ([facility])    Stroke ([place]) [id number]   Umbilical hernia    Wears glasses    Past Surgical History:  Procedure Laterality Date   CHOLECYSTECTOMY     CYSTOSCOPY W/ URETERAL STENT PLACEMENT Bilateral 05/24/2020   Procedure: CYSTOSCOPY WITH RETROGRADE PYELOGRAM/URETERAL STENT PLACEMENT;  Surgeon: Robley Fries, MD;  Location: [facility];  Service: Urology;  Laterality: Bilateral;   CYSTOSCOPY/RETROGRADE/URETEROSCOPY Bilateral 04/03/2020   Procedure: CYSTOSCOPY/RETROGRADE/URETEROSCOPY;  Surgeon: Ardis Hughs, MD;  Location: [facility];  Service: Urology;  Laterality: Bilateral;   HYSTERECTOMY ABDOMINAL WITH SALPINGO-OOPHORECTOMY  07/21/2020   Procedure: HYSTERECTOMY ABDOMINAL WITH BILATERAL SALPINGO-OOPHORECTOMY;  Surgeon: Sanjuana Kava, MD;  Location: [facility];  Service: Gynecology;;   IR FLUORO GUIDE CV LINE RIGHT  02/21/2020   IR US GUIDE VASC ACCESS RIGHT  02/21/2020   NEPHROLITHOTOMY Left 02/14/2020   Procedure: NEPHROLITHOTOMY PERCUTANEOUS/ SURGEON ACCESS/ LEFT PERCUTANEOUS NEPHROSTOMY TUBE PLACEMENT;  Surgeon: Ardis Hughs, MD;  Location: [facility];  Service: Urology;  Laterality: Left;   NEPHROLITHOTOMY Left 02/21/2020   Procedure: NEPHROLITHOTOMY PERCUTANEOUS SECOND LOOK;  Surgeon: Ardis Hughs, MD;  Location: [facility];  Service: Urology;  Laterality: Left;   NEPHROLITHOTOMY Left 02/26/2020   Procedure: NEPHROLITHOTOMY PERCUTANEOUS;  Surgeon: Ceasar Mons, MD;  Location: [facility];  Service: Urology;  Laterality: Left;  NEED 150 MIN   NEPHROLITHOTOMY Right 03/24/2020   Procedure: NEPHROLITHOTOMY PERCUTANEOUS WITH ACCESS LEFT STENT REMOVAL;   Surgeon: Ardis Hughs, MD;  Location: [facility];  Service: Urology;  Laterality: Right;   RIGHT HEART CATH N/A 11/09/2019   Procedure: RIGHT HEART CATH;  Surgeon: Jolaine Artist, MD;  Location: [facility];  Service: Cardiovascular;  Laterality: N/A;   WISDOM TOOTH EXTRACTION      FAMILY HISTORY Family History  Problem Relation Age of Onset   Atrial fibrillation Mother    Hypertension Mother    Stroke Mother 44   Diabetes Mother    Diabetes Father    Hypertension Father    Diabetes Sister    Hypertension Sister    Diabetes Brother    Hypertension Brother    Diabetes Brother    Heart attack Brother    Stroke Maternal Grandmother 67    SOCIAL HISTORY Social History   Tobacco Use   Smoking status: Never   Smokeless tobacco: Never  Vaping Use   Vaping Use: Never used  Substance Use Topics   Alcohol  use: Never   Drug use: Never         OPHTHALMIC EXAM:  Base Eye Exam     Visual Acuity (Snellen - Linear)       Right Left   Dist [place] 20/200 +1 HM   Dist ph  20/30 -2          Tonometry (Tonopen, 3:15 PM)       Right Left   Pressure 19 16         Pupils       Dark Light Shape React APD   Right 5 4 Round Brisk 0   Left 5 4 Round Brisk Trace         Visual Fields       Left Right     Full   Restrictions Total superior temporal, inferior temporal, superior nasal, inferior nasal deficiencies          Extraocular Movement       Right Left    Full Full         Neuro/Psych     Oriented x3: Yes   Mood/Affect: Normal         Dilation     Both eyes: 1.0% Mydriacyl, 2.5% Phenylephrine @ 3:15 PM           Slit Lamp and Fundus Exam     External Exam       Right Left   External Normal Normal         Slit Lamp Exam       Right Left   Lids/Lashes Dermatochalasis - upper lid Dermatochalasis - upper lid   Conjunctiva/Sclera Melanosis Melanosis   Cornea mild arcus mild arcus   Anterior Chamber Deep and quiet  Deep and quiet   Iris Round and dilated, No NVI Round and dilated, No NVI   Lens 1-2+ Nuclear sclerosis, 1-2+ Cortical cataract, fine pigment deposition on posterior capsule 2+ Nuclear sclerosis, 2-3+ Cortical cataract, +RBC on posterior capsule   Vitreous Vitreous syneresis, fine pigment v RBC Diffuse VH         Fundus Exam       Right Left   Disc Pink and Sharp, +fibrosis and NVD, mild PPP no details visible   C/D Ratio 0.2    Macula Flat, Blunted foveal reflex, scattered MA, RPE mottling no details visible   Vessels attenuated, Tortuous, peripheral sclerosis Normal   Periphery Attached, scattered patches of fibrosis and NVD, small flap tear at 0130 equator--no SRF, old VH settled inferiorly    No details visible           Refraction     Wearing Rx       Sphere Cylinder Axis   Right -2.50 +1.75 178   Left -2.50 +1.25 018            IMAGING AND PROCEDURES  Imaging and Procedures for 09/07/2021  OCT, Retina - OU - Both Eyes       Right Eye Quality was good. Central Foveal Thickness: 225. Findings include normal foveal contour, no IRF, no SRF, intraretinal hyper-reflective material (Trace cystic changes, partial PVD and disc fibrosis visible on widefield).   Left Eye Quality was poor. Findings include (No readable image obtained).   Notes *Images captured and stored on drive  Diagnosis / Impression:  OD: Trace cystic changes, partial PVD and disc fibrosis visible on widefield -- no frank DME OS: no readable images  Clinical management:  See below  Abbreviations: NFP -  Normal foveal profile. CME - cystoid macular edema. PED - pigment epithelial detachment. IRF - intraretinal fluid. SRF - subretinal fluid. EZ - ellipsoid zone. ERM - epiretinal membrane. ORA - outer retinal atrophy. ORT - outer retinal tubulation. SRHM - subretinal hyper-reflective material. IRHM - intraretinal hyper-reflective material      Panretinal Photocoagulation - OD - Right Eye        LASER PROCEDURE NOTE  Diagnosis:   Proliferative Diabetic Retinopathy, RIGHT EYE  Procedure:  Pan-retinal photocoagulation using slit lamp laser, RIGHT EYE  Anesthesia:  Topical  Surgeon: Bernarda Caffey, MD, PhD   Informed consent obtained, operative eye marked, and time out performed prior to initiation of laser.   Lumenis [id number] slit lamp laser Pattern: 3x3 square Power: 270 mW Duration: 30 msec  Spot size: 200 microns  # spots: [phone]  Complications: None.  Notes: vitreous heme and debris obscuring view and preventing laser up take inferior periphery  RTC: 3 wks -- DFE/OCT  Patient tolerated the procedure well and received written and verbal post-procedure care information/education.            ASSESSMENT/PLAN:    ICD-10-CM   1. 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 Vitreous hemorrhage of left eye (HCC)  H43.12     3. Traction detachment of left retina  H33.42     4. Retinal edema  H35.81 OCT, Retina - OU - Both Eyes    5. Retinal tear of right eye  H33.311     6. Essential hypertension  I10     7. Hypertensive retinopathy of both eyes  H35.033     8. Combined forms of age-related cataract of both eyes  H25.813      1-4. Proliferative diabetic retinopathy OU  - OD: w/ +NVD  - OS: with chronic, diffuse VH and underlying TRD - The incidence, risk factors for progression, natural history and treatment options for diabetic retinopathy were discussed with patient.   - The need for close monitoring of blood glucose, blood pressure, and serum lipids, avoiding cigarette or any type of tobacco, and the need for long term follow up was also discussed with patient. - exam shows +NVD and scattered fibrosis OD, OS no view posteriorly due to dense VH - bscan OS 9.14.22 shows diffuse VH OS, hyperechoic membranes, probable TRD -  OCT without diabetic macular edema - discussed findings and prognosis - OS will need surgery (PPV w/ membrane peel, endolaser, gas vs oil) -- discussed with Dr. Katy Fitch re: possible cataract surgery prior to retina surgery - recommend PRP OD today, 9.19.22 - RBA of procedure discussed, questions answered - informed consent obtained and signed - see procedure note  - start Lotemax SM QID OD x5-7 d - f/u 3 wks for DFE/OCT, surgical planning  5. Retinal tear, OD - The incidence, risk factors, and natural history of retinal tear was discussed with patient.   - Potential treatment options including laser retinopexy and cryotherapy discussed with patient. - small flap tear located at 0130 equator, no SRF - lasered with PRP OD as above on 9.19.22  6,7. Hypertensive retinopathy OU - discussed importance of tight BP control - monitor  8. Mixed Cataract OU - The symptoms of cataract, surgical options, and treatments and risks were discussed with patient. - discussed diagnosis and progression - under the expert management of Dr. Wyatt Portela -  discussed possible CEIOL OS prior to retina surgery  Ophthalmic Meds Ordered this visit:  No orders of the defined types were placed in this encounter.     Return in about 3 weeks (around 09/28/2021) for Dilated Exam, OCT, surgical planning OS.  There are no Patient Instructions on file for this visit.  This document serves as a record of services personally performed by Gardiner Sleeper, MD, PhD. It was created on their behalf by Estill Bakes, COT an ophthalmic technician. The creation of this record is the provider's dictation and/or activities during the visit.    Electronically signed by: Estill Bakes, [place] 9.16.22 @ 5:14 PM   Gardiner Sleeper, M.D., Ph.D. Diseases & Surgery of the Retina and Vitreous [facility]  I have reviewed the above documentation for accuracy and completeness, and I agree with the above. Gardiner Sleeper, M.D., Ph.D. 09/07/21 5:14 PM  Abbreviations: M myopia (nearsighted); A astigmatism; H hyperopia (farsighted); P presbyopia; Mrx spectacle prescription;  CTL contact lenses; OD right eye; OS left eye; OU both eyes  XT exotropia; ET esotropia; PEK punctate epithelial keratitis; PEE punctate epithelial erosions; DES dry eye syndrome; MGD meibomian gland dysfunction; ATs artificial tears; PFAT's preservative free artificial tears; [place] nuclear sclerotic cataract; PSC posterior subcapsular cataract; ERM epi-retinal membrane; PVD posterior vitreous detachment; RD retinal detachment; DM diabetes mellitus; DR diabetic retinopathy; NPDR non-proliferative diabetic retinopathy; PDR proliferative diabetic retinopathy; CSME clinically significant macular edema; DME diabetic macular edema; dbh dot blot hemorrhages; CWS cotton wool spot; POAG primary open angle glaucoma; C/D cup-to-disc ratio; HVF humphrey visual field; GVF goldmann visual field; OCT optical coherence tomography; IOP intraocular pressure; BRVO Branch retinal vein occlusion; CRVO central retinal vein occlusion; CRAO central retinal artery occlusion; BRAO branch retinal artery occlusion; RT retinal tear; SB scleral buckle; PPV pars plana vitrectomy; VH Vitreous hemorrhage; PRP panretinal laser photocoagulation; IVK intravitreal kenalog; VMT vitreomacular traction; MH Macular hole;  NVD neovascularization of the disc; NVE neovascularization elsewhere; AREDS age related eye disease study; ARMD age related macular degeneration; POAG primary open angle glaucoma; EBMD epithelial/anterior basement membrane dystrophy; ACIOL anterior chamber intraocular lens; IOL intraocular lens; PCIOL posterior chamber intraocular lens; Phaco/IOL phacoemulsification with intraocular lens placement; [place] photorefractive keratectomy; LASIK laser assisted in situ keratomileusis; HTN hypertension; DM diabetes mellitus; COPD chronic obstructive pulmonary disease

## 2021-09-07 ENCOUNTER — Encounter (INDEPENDENT_AMBULATORY_CARE_PROVIDER_SITE_OTHER): Payer: Self-pay | Admitting: Ophthalmology

## 2021-09-07 ENCOUNTER — Ambulatory Visit (INDEPENDENT_AMBULATORY_CARE_PROVIDER_SITE_OTHER): Admitting: Ophthalmology

## 2021-09-07 ENCOUNTER — Other Ambulatory Visit: Payer: Self-pay

## 2021-09-07 DIAGNOSIS — H33311 Horseshoe tear of retina without detachment, right eye: Secondary | ICD-10-CM

## 2021-09-07 DIAGNOSIS — E113593 Type 2 diabetes mellitus with proliferative diabetic retinopathy without macular edema, bilateral: Secondary | ICD-10-CM

## 2021-09-07 DIAGNOSIS — H4312 Vitreous hemorrhage, left eye: Secondary | ICD-10-CM | POA: Diagnosis not present

## 2021-09-07 DIAGNOSIS — I10 Essential (primary) hypertension: Secondary | ICD-10-CM

## 2021-09-07 DIAGNOSIS — H35033 Hypertensive retinopathy, bilateral: Secondary | ICD-10-CM

## 2021-09-07 DIAGNOSIS — H3342 Traction detachment of retina, left eye: Secondary | ICD-10-CM | POA: Diagnosis not present

## 2021-09-07 DIAGNOSIS — H25813 Combined forms of age-related cataract, bilateral: Secondary | ICD-10-CM

## 2021-09-07 DIAGNOSIS — H3581 Retinal edema: Secondary | ICD-10-CM

## 2021-09-14 ENCOUNTER — Other Ambulatory Visit (HOSPITAL_COMMUNITY): Payer: Self-pay | Admitting: Adult Health

## 2021-09-14 ENCOUNTER — Telehealth (HOSPITAL_COMMUNITY): Payer: Self-pay | Admitting: Pharmacist

## 2021-09-14 ENCOUNTER — Other Ambulatory Visit: Payer: Self-pay

## 2021-09-14 NOTE — Telephone Encounter (Signed)
Opened in error

## 2021-09-15 ENCOUNTER — Other Ambulatory Visit: Payer: Self-pay

## 2021-09-15 MED ORDER — MEXILETINE HCL 150 MG PO CAPS
ORAL_CAPSULE | ORAL | 6 refills | Status: DC
  Filled 2021-09-15: qty 120, 30d supply, fill #0
  Filled 2021-10-21 – 2021-10-28 (×2): qty 120, 30d supply, fill #1
  Filled 2021-12-07: qty 120, 30d supply, fill #2
  Filled 2022-01-26: qty 120, 30d supply, fill #0
  Filled 2022-03-01: qty 120, 30d supply, fill #1
  Filled 2022-04-20: qty 120, 30d supply, fill #2
  Filled 2022-05-04 – 2022-06-03 (×3): qty 120, 30d supply, fill #3

## 2021-09-16 ENCOUNTER — Other Ambulatory Visit: Payer: Self-pay

## 2021-09-22 NOTE — Progress Notes (Signed)
[facility] Note  09/28/2021     CHIEF COMPLAINT Patient presents for Post-op Follow-up   HISTORY OF PRESENT ILLNESS: Adrienne Lane is a 55 y.o. female who presents to the clinic today for:  HPI     Post-op Follow-up   In right eye.  I, the attending physician,  performed the HPI with the patient and updated documentation appropriately.        Comments   3 week post op PRP OD- Doing well.  Eye was red after the laser but better now.  Using Prednisolone BID OD.    BS 113 yesterday A1C 8      Last edited by Bernarda Caffey, MD on 09/28/2021 11:10 PM.     Referring physician: Debbra Riding, MD [address] [place],  [place] [address]  HISTORICAL INFORMATION:   Selected notes from the MEDICAL RECORD NUMBER Referred by Dr. Wyatt Portela for concern of VH   CURRENT MEDICATIONS: No current outpatient medications on file. (Ophthalmic Drugs)   No current facility-administered medications for this visit. (Ophthalmic Drugs)   Current Outpatient Medications (Other)  Medication Sig   amLODipine (NORVASC) 10 MG tablet TAKE 1 TABLET (10 MG TOTAL) BY MOUTH DAILY.   aspirin 81 MG chewable tablet Chew 1 tablet (81 mg total) by mouth daily.   atorvastatin (LIPITOR) 80 MG tablet take 1 tablet (80 mg) by oral route once daily at bedtime for 90 days   carvedilol (COREG) 6.25 MG tablet TAKE 1 TABLET (6.25 MG TOTAL) BY MOUTH 2 (TWO) TIMES DAILY WITH A MEAL.   cholecalciferol (VITAMIN D3) 25 MCG (1000 UNIT) tablet Take 1,000 Units by mouth daily.   Coenzyme Q10 (Q-10 CO-ENZYME PO) Take 1 capsule by mouth daily in the afternoon.   dabigatran (PRADAXA) 150 MG CAPS capsule TAKE 1 CAPSULE (150 MG TOTAL) BY MOUTH EVERY 12 HOURS.   dapagliflozin propanediol (FARXIGA) 10 MG TABS tablet Take 1 tablet (10 mg total) by mouth daily before breakfast.   hydrALAZINE (APRESOLINE) 100 MG tablet TAKE 1 TABLET (100 MG TOTAL) BY MOUTH 3 (THREE) TIMES DAILY.   insulin  aspart (NOVOLOG) 100 UNIT/ML injection Use as directed sliding scale insulin TID.   insulin glargine (LANTUS) 100 UNIT/ML injection INJECT 0.22 MLS (22 UNITS TOTAL) INTO THE SKIN EVERY MORNING.   isosorbide mononitrate (IMDUR) 60 MG 24 hr tablet TAKE 1 TABLET (60 MG TOTAL) BY MOUTH DAILY.   mexiletine (MEXITIL) 150 MG capsule TAKE 2 CAPSULES (300 MG TOTAL) BY MOUTH EVERY 12 (TWELVE) HOURS.   Multiple Vitamins-Minerals (WOMENS MULTI PO) Take 1 tablet by mouth daily.   potassium chloride (KLOR-CON) 10 MEQ tablet Take 1 tablet (10 mEq total) by mouth daily.   sacubitril-valsartan (ENTRESTO) 24-26 MG Take 1 tablet by mouth 2 (two) times daily.   glucose blood (TRUE METRIX BLOOD GLUCOSE TEST) test strip Use as instructed   Insulin Syringe-Needle U-100 (TRUEPLUS INSULIN SYRINGE) 30G X 5/16" 0.3 ML MISC USE AS DIRECTED 3 TIMES DAILY.   Insulin Syringe-Needle U-100 30G X 5/16" 0.5 ML MISC Use as directed.   Lancets (ONETOUCH ULTRASOFT) lancets Check CBG twice a day   TRUEplus Lancets 28G MISC CHECK BLOOD GLUCOSE TWICE A DAY   No current facility-administered medications for this visit. (Other)   REVIEW OF SYSTEMS: ROS   Positive for: Neurological, Genitourinary, Endocrine, Cardiovascular, Eyes Negative for: Constitutional, Gastrointestinal, Skin, Musculoskeletal, HENT, Respiratory, Psychiatric, Allergic/Imm, Heme/Lymph Last edited by Leonie Douglas, COA on 09/28/2021  2:11 PM.    ALLERGIES Allergies  Allergen Reactions   Adhesive [Tape]     Tears skin, can tolerate paper tape    PAST MEDICAL HISTORY Past Medical History:  Diagnosis Date   Atrial fibrillation with RVR ([place])    Bigeminy 01/2020   CHF (congestive heart failure) ([place]) 10/20/2019   CKD (chronic kidney disease), stage IV (HCC)    Diabetes mellitus without complication ([place])    DOE (dyspnea on exertion)    walking upstairs or up hill resolves in one minute   Fibroids    History of kidney stones    History of recent blood  transfusion 02/26/2020   Hypertension    Iron deficiency anemia    Non-ischemic cardiomyopathy (HCC)    tachycardia induced   Obese    Peripheral edema    Premature ventricular contractions (PVCs) (VPCs)    Stroke ([place]) 95/2841   Umbilical hernia    Wears glasses    Past Surgical History:  Procedure Laterality Date   CHOLECYSTECTOMY     CYSTOSCOPY W/ URETERAL STENT PLACEMENT Bilateral 05/24/2020   Procedure: CYSTOSCOPY WITH RETROGRADE PYELOGRAM/URETERAL STENT PLACEMENT;  Surgeon: Robley Fries, MD;  [facility];  Service: Urology;  Laterality: Bilateral;   CYSTOSCOPY/RETROGRADE/URETEROSCOPY Bilateral 04/03/2020   Procedure: CYSTOSCOPY/RETROGRADE/URETEROSCOPY;  Surgeon: Ardis Hughs, MD;  [facility];  Service: Urology;  Laterality: Bilateral;   HYSTERECTOMY ABDOMINAL WITH SALPINGO-OOPHORECTOMY  07/21/2020   Procedure: HYSTERECTOMY ABDOMINAL WITH BILATERAL SALPINGO-OOPHORECTOMY;  Surgeon: Sanjuana Kava, MD;  Location: [facility];  Service: Gynecology;;   IR FLUORO GUIDE CV LINE RIGHT  02/21/2020   IR US GUIDE VASC ACCESS RIGHT  02/21/2020   NEPHROLITHOTOMY Left 02/14/2020   Procedure: NEPHROLITHOTOMY PERCUTANEOUS/ SURGEON ACCESS/ LEFT PERCUTANEOUS NEPHROSTOMY TUBE PLACEMENT;  Surgeon: Ardis Hughs, MD;  [facility];  Service: Urology;  Laterality: Left;   NEPHROLITHOTOMY Left 02/21/2020   Procedure: NEPHROLITHOTOMY PERCUTANEOUS SECOND LOOK;  Surgeon: Ardis Hughs, MD;  [facility];  Service: Urology;  Laterality: Left;   NEPHROLITHOTOMY Left 02/26/2020   Procedure: NEPHROLITHOTOMY PERCUTANEOUS;  Surgeon: Ceasar Mons, MD;  [facility];  Service: Urology;  Laterality: Left;  NEED 150 MIN   NEPHROLITHOTOMY Right 03/24/2020   Procedure: NEPHROLITHOTOMY PERCUTANEOUS WITH ACCESS LEFT STENT REMOVAL;  Surgeon: Ardis Hughs, MD;  [facility];  Service: Urology;  Laterality: Right;   RIGHT HEART CATH N/A 11/09/2019   Procedure: RIGHT HEART  CATH;  Surgeon: Jolaine Artist, MD;  Location: [facility];  Service: Cardiovascular;  Laterality: N/A;   WISDOM TOOTH EXTRACTION     FAMILY HISTORY Family History  Problem Relation Age of Onset   Atrial fibrillation Mother    Hypertension Mother    Stroke Mother 35   Diabetes Mother    Diabetes Father    Hypertension Father    Diabetes Sister    Hypertension Sister    Diabetes Brother    Hypertension Brother    Diabetes Brother    Heart attack Brother    Stroke Maternal Grandmother 78   SOCIAL HISTORY Social History   Tobacco Use   Smoking status: Never   Smokeless tobacco: Never  Vaping Use   Vaping Use: Never used  Substance Use Topics   Alcohol use: Never   Drug use: Never       OPHTHALMIC EXAM:  Base Eye Exam     Visual Acuity (Snellen - Linear)       Right Left  Dist cc 20/30 -2 HM   Dist ph cc 20/25     Correction: Glasses         Tonometry (Tonopen, 2:20 PM)       Right Left   Pressure 16 16         Pupils       Dark Light Shape React APD   Right 4 3 Round Brisk None   Left 4 5 Round Brisk +1         Visual Fields (Counting fingers)       Left Right     Full   Restrictions Total superior temporal, inferior temporal, superior nasal, inferior nasal deficiencies          Extraocular Movement       Right Left    Full Full         Neuro/Psych     Oriented x3: Yes   Mood/Affect: Normal         Dilation     Both eyes: 1.0% Mydriacyl, 2.5% Phenylephrine @ 2:20 PM           Slit Lamp and Fundus Exam     External Exam       Right Left   External Normal Normal         Slit Lamp Exam       Right Left   Lids/Lashes Dermatochalasis - upper lid Dermatochalasis - upper lid   Conjunctiva/Sclera Melanosis Melanosis, mild Subconjunctival hemorrhage IT   Cornea mild arcus, trace PEE mild arcus, well healed cataract wound   Anterior Chamber Deep and quiet Deep and quiet   Iris Round and dilated, No  NVI Round and dilated, No NVI   Lens 1-2+ Nuclear sclerosis, 1-2+ Cortical cataract, fine pigment deposition on posterior capsule PC IOL in good position   Vitreous Vitreous syneresis, fine pigment v RBC Diffuse VH, red and white         Fundus Exam       Right Left   Disc Pink and Sharp, +fibrosis, +NVD - regressing, mild PPP no details visible   C/D Ratio 0.2    Macula Flat, Blunted foveal reflex, scattered MA, RPE mottling no details visible   Vessels attenuated, Tortuous, peripheral sclerosis Normal   Periphery Attached, scattered patches of fibrosis and NVD, small flap tear at 0130 equator--no SRF, old VH settled inferiorly; PRP laser changes No details visible           Refraction     Wearing Rx       Sphere Cylinder Axis   Right -2.50 +1.75 178   Left -2.50 +1.25 018            IMAGING AND PROCEDURES  Imaging and Procedures for 09/28/2021  OCT, Retina - OU - Both Eyes       Right Eye Quality was good. Central Foveal Thickness: 217. Findings include normal foveal contour, no IRF, no SRF, intraretinal hyper-reflective material (Trace cystic changes, partial PVD and disc fibrosis visible on widefield).   Left Eye Findings include (No image obtained today).   Notes *Images captured and stored on drive  Diagnosis / Impression:  OD: Trace cystic changes, partial PVD and disc fibrosis visible on widefield -- no frank DME OS: no image obtained today  Clinical management:  See below  Abbreviations: NFP - Normal foveal profile. CME - cystoid macular edema. PED - pigment epithelial detachment. IRF - intraretinal fluid. SRF - subretinal fluid. EZ - ellipsoid zone. ERM -  epiretinal membrane. ORA - outer retinal atrophy. ORT - outer retinal tubulation. SRHM - subretinal hyper-reflective material. IRHM - intraretinal hyper-reflective material      B-Scan Ultrasound - OS - Left Eye       Quality was good. Findings included vitreous hemorrhage, vitreous  opacities, traction retinal detachment.   Notes **Images stored on drive**  Impression: OD: vitreous opacities consistent with hemorrhage; significant immobile hyperechoic signals about the disc -- preretinal fibrosis, probable TRD; no mass             ASSESSMENT/PLAN:    ICD-10-CM   1. 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 Vitreous hemorrhage of left eye (HCC)  H43.12 B-Scan Ultrasound - OS - Left Eye    3. Traction detachment of left retina  H33.42     4. Retinal edema  H35.81 OCT, Retina - OU - Both Eyes    5. Retinal tear of right eye  H33.311     6. Essential hypertension  I10     7. Hypertensive retinopathy of both eyes  H35.033     8. Combined forms of age-related cataract of right eye  H25.811     9. Pseudophakia  Z96.1      1-4. Proliferative diabetic retinopathy OU  - s/p PRP OD (09.19.22)             - OD: w/ +NVD  - OS: with chronic, diffuse VH and underlying TRD - exam shows +NVD and scattered fibrosis OD, OS no view posteriorly due to dense VH - bscan OS 9.14.22 and 10.10.22 shows diffuse VH OS, hyperechoic membranes, probable TRD - OCT without diabetic macular edema - discussed findings and prognosis - OS will need surgery for VH and probable TRD OS (PPV w/ membrane peel, endolaser, gas vs oil OS under GA) - pt wishes to proceed with surgery - RBA of procedure discussed, questions answered - informed consent obtained and signed  - surgery scheduled for next Thursday, October 08, 2021 [facility] OR 8 - f/u Friday, October 21 for POV   5. Retinal tear, OD - The incidence, risk factors, and natural history of retinal tear was discussed with patient.   - Potential treatment options including laser retinopexy and cryotherapy discussed with patient. - small flap tear located at 0130 equator, no SRF - lasered with PRP OD as above on 9.19.22  6,7. Hypertensive  retinopathy OU - discussed importance of tight BP control - monitor  8. Mixed Cataract OD - The symptoms of cataract, surgical options, and treatments and risks were discussed with patient. - discussed diagnosis and progression - under the expert management of Dr. Wyatt Portela - discussed possible CEIOL OS prior to retina surgery  9. Pseudophakia OS  - s/p CE/IOL (Dr. Katy Fitch)  - IOL in good position, doing well  - monitor  Ophthalmic Meds Ordered this visit:  No orders of the defined types were placed in this encounter.    Return for f/u October 21 POV 1.  There are no Patient Instructions on file for this visit.  This document serves as a record of services personally performed by Gardiner Sleeper, MD, PhD. It was created on their behalf by Leeann Must, [place], an ophthalmic technician. The creation of this record is the provider's dictation and/or activities during the visit.    Electronically signed by: Leeann Must, COA @TODAY @ 11:28 PM  This document serves as  a record of services personally performed by Gardiner Sleeper, MD, PhD. It was created on their behalf by San Jetty. Owens Shark, OA an ophthalmic technician. The creation of this record is the provider's dictation and/or activities during the visit.    Electronically signed by: San Jetty. Owens Shark, [place] 10.10.2022 11:28 PM  Gardiner Sleeper, M.D., Ph.D. Diseases & Surgery of the Retina and [place] 09/28/2021   I have reviewed the above documentation for accuracy and completeness, and I agree with the above. Gardiner Sleeper, M.D., Ph.D. 09/28/21 11:28 PM  Abbreviations: M myopia (nearsighted); A astigmatism; H hyperopia (farsighted); P presbyopia; Mrx spectacle prescription;  CTL contact lenses; OD right eye; OS left eye; OU both eyes  XT exotropia; ET esotropia; PEK punctate epithelial keratitis; PEE punctate epithelial erosions; DES dry eye syndrome; MGD meibomian gland dysfunction; ATs  artificial tears; PFAT's preservative free artificial tears; [place] nuclear sclerotic cataract; PSC posterior subcapsular cataract; ERM epi-retinal membrane; PVD posterior vitreous detachment; RD retinal detachment; DM diabetes mellitus; DR diabetic retinopathy; NPDR non-proliferative diabetic retinopathy; PDR proliferative diabetic retinopathy; CSME clinically significant macular edema; DME diabetic macular edema; dbh dot blot hemorrhages; CWS cotton wool spot; POAG primary open angle glaucoma; C/D cup-to-disc ratio; HVF humphrey visual field; GVF goldmann visual field; OCT optical coherence tomography; IOP intraocular pressure; BRVO Branch retinal vein occlusion; CRVO central retinal vein occlusion; CRAO central retinal artery occlusion; BRAO branch retinal artery occlusion; RT retinal tear; SB scleral buckle; PPV pars plana vitrectomy; VH Vitreous hemorrhage; PRP panretinal laser photocoagulation; IVK intravitreal kenalog; VMT vitreomacular traction; MH Macular hole;  NVD neovascularization of the disc; NVE neovascularization elsewhere; AREDS age related eye disease study; ARMD age related macular degeneration; POAG primary open angle glaucoma; EBMD epithelial/anterior basement membrane dystrophy; ACIOL anterior chamber intraocular lens; IOL intraocular lens; PCIOL posterior chamber intraocular lens; Phaco/IOL phacoemulsification with intraocular lens placement; [place] photorefractive keratectomy; LASIK laser assisted in situ keratomileusis; HTN hypertension; DM diabetes mellitus; COPD chronic obstructive pulmonary disease

## 2021-09-28 ENCOUNTER — Encounter (INDEPENDENT_AMBULATORY_CARE_PROVIDER_SITE_OTHER): Payer: Self-pay | Admitting: Ophthalmology

## 2021-09-28 ENCOUNTER — Other Ambulatory Visit: Payer: Self-pay

## 2021-09-28 ENCOUNTER — Ambulatory Visit (INDEPENDENT_AMBULATORY_CARE_PROVIDER_SITE_OTHER): Admitting: Ophthalmology

## 2021-09-28 DIAGNOSIS — H4312 Vitreous hemorrhage, left eye: Secondary | ICD-10-CM

## 2021-09-28 DIAGNOSIS — E113593 Type 2 diabetes mellitus with proliferative diabetic retinopathy without macular edema, bilateral: Secondary | ICD-10-CM

## 2021-09-28 DIAGNOSIS — Z961 Presence of intraocular lens: Secondary | ICD-10-CM

## 2021-09-28 DIAGNOSIS — H3581 Retinal edema: Secondary | ICD-10-CM

## 2021-09-28 DIAGNOSIS — H25811 Combined forms of age-related cataract, right eye: Secondary | ICD-10-CM

## 2021-09-28 DIAGNOSIS — H3342 Traction detachment of retina, left eye: Secondary | ICD-10-CM | POA: Diagnosis not present

## 2021-09-28 DIAGNOSIS — H33311 Horseshoe tear of retina without detachment, right eye: Secondary | ICD-10-CM

## 2021-09-28 DIAGNOSIS — H35033 Hypertensive retinopathy, bilateral: Secondary | ICD-10-CM

## 2021-09-28 DIAGNOSIS — H25813 Combined forms of age-related cataract, bilateral: Secondary | ICD-10-CM

## 2021-09-28 DIAGNOSIS — I10 Essential (primary) hypertension: Secondary | ICD-10-CM

## 2021-09-29 ENCOUNTER — Other Ambulatory Visit (HOSPITAL_COMMUNITY): Payer: Self-pay

## 2021-09-29 ENCOUNTER — Telehealth (HOSPITAL_COMMUNITY): Payer: Self-pay | Admitting: Pharmacy Technician

## 2021-09-29 ENCOUNTER — Other Ambulatory Visit (HOSPITAL_COMMUNITY): Payer: Self-pay | Admitting: *Deleted

## 2021-09-29 DIAGNOSIS — I10 Essential (primary) hypertension: Secondary | ICD-10-CM

## 2021-09-29 MED ORDER — ISOSORBIDE MONONITRATE ER 60 MG PO TB24
60.0000 mg | ORAL_TABLET | Freq: Every day | ORAL | 6 refills | Status: DC
  Filled 2021-09-29: qty 30, 30d supply, fill #0
  Filled 2021-11-17: qty 30, 30d supply, fill #1
  Filled 2021-12-23: qty 30, 30d supply, fill #2
  Filled 2022-01-26: qty 30, 30d supply, fill #3
  Filled 2022-03-01: qty 30, 30d supply, fill #4
  Filled 2022-04-05: qty 30, 30d supply, fill #5

## 2021-09-29 MED ORDER — DAPAGLIFLOZIN PROPANEDIOL 10 MG PO TABS
10.0000 mg | ORAL_TABLET | Freq: Every day | ORAL | 6 refills | Status: DC
  Filled 2021-09-29: qty 30, 30d supply, fill #0
  Filled 2021-11-17: qty 30, 30d supply, fill #1
  Filled 2021-12-23: qty 30, 30d supply, fill #2
  Filled 2022-01-26: qty 30, 30d supply, fill #3
  Filled 2022-03-01: qty 30, 30d supply, fill #4
  Filled 2022-04-05: qty 30, 30d supply, fill #5
  Filled 2022-05-04: qty 30, 30d supply, fill #6

## 2021-09-29 MED ORDER — POTASSIUM CHLORIDE ER 10 MEQ PO TBCR
10.0000 meq | EXTENDED_RELEASE_TABLET | Freq: Every day | ORAL | 6 refills | Status: DC
  Filled 2021-09-29 – 2021-10-16 (×2): qty 30, 30d supply, fill #0
  Filled 2021-11-17: qty 30, 30d supply, fill #1
  Filled 2021-12-23: qty 30, 30d supply, fill #2
  Filled 2022-03-01: qty 30, 30d supply, fill #3

## 2021-09-29 MED ORDER — CARVEDILOL 6.25 MG PO TABS
6.2500 mg | ORAL_TABLET | Freq: Two times a day (BID) | ORAL | 6 refills | Status: DC
  Filled 2021-09-29: qty 60, 30d supply, fill #0
  Filled 2021-11-17: qty 60, 30d supply, fill #1
  Filled 2021-12-23: qty 60, 30d supply, fill #2
  Filled 2022-02-08: qty 60, 30d supply, fill #3
  Filled 2022-03-23 – 2022-03-25 (×2): qty 60, 30d supply, fill #4
  Filled 2022-05-04: qty 60, 30d supply, fill #5
  Filled 2022-06-07: qty 60, 30d supply, fill #6

## 2021-09-29 MED ORDER — HYDRALAZINE HCL 100 MG PO TABS
100.0000 mg | ORAL_TABLET | Freq: Three times a day (TID) | ORAL | 6 refills | Status: DC
  Filled 2021-09-29: qty 90, 30d supply, fill #0
  Filled 2021-11-20: qty 90, 30d supply, fill #1
  Filled 2022-01-26: qty 90, 30d supply, fill #2
  Filled 2022-03-23 – 2022-03-25 (×2): qty 90, 30d supply, fill #3
  Filled 2022-05-31: qty 90, 30d supply, fill #4
  Filled 2022-07-19 – 2022-07-23 (×2): qty 90, 30d supply, fill #5
  Filled 2022-09-14: qty 90, 30d supply, fill #6

## 2021-09-29 MED ORDER — AMLODIPINE BESYLATE 10 MG PO TABS
10.0000 mg | ORAL_TABLET | Freq: Every day | ORAL | 6 refills | Status: AC
  Filled 2021-09-29: qty 30, fill #0
  Filled 2021-10-16: qty 30, 30d supply, fill #0
  Filled 2021-11-20: qty 30, 30d supply, fill #1
  Filled 2022-01-15: qty 30, 30d supply, fill #2
  Filled 2022-02-11: qty 30, 30d supply, fill #3
  Filled 2022-03-11: qty 30, 30d supply, fill #4

## 2021-09-29 NOTE — Telephone Encounter (Signed)
Advanced Heart Failure Patient Advocate Encounter  Patient called in requesting a renewal of [place] assistance. The patient is currently uninsured, she can use the HF fund for Farxiga, Amlodipine, Atorvastatin, Carvedilol, Potassium, Hydralazine, Isosorbide. Sent 30 day RX request to [facility] ([place]) to send to [facility] outpatient. She is aware to continue getting other maintenance medications from [facility]. She gets Pradaxa from [facility] (eligibility end date 02/17/22).  Started an [place] for [employer] assistance with [employer]. Will have at the check in desk for the patient to sign.   Will fax in once signatures are received.

## 2021-09-30 ENCOUNTER — Other Ambulatory Visit (HOSPITAL_COMMUNITY): Payer: Self-pay

## 2021-10-01 NOTE — Telephone Encounter (Signed)
Sent in application via fax 10/12.  Will follow up.  

## 2021-10-05 ENCOUNTER — Telehealth: Payer: Self-pay | Admitting: Internal Medicine

## 2021-10-05 NOTE — H&P (Signed)
Adrienne Lane is an 55 y.o. female.    Chief Complaint: decreased vision OS  HPI: Pt with complex medical history that includes poorly controlled DM2, Afib (on Pradaxa), who presented with a 6 mo history of decreased vision OS. On dilated exam, was found to have a chronic, non-clearing vitreous hemorrhage of the left eye and proliferative diabetic retinopathy OU. A b-scan ultrasound demonstrated probably tractional retinal detachment under the vitreous hemorrhage OS. After a discussion of the risks, benefits and alternatives to surgery, the patient has electively decided to proceed with surgery in the left eye to clear the hemorrhage and to repair any underlying diabetic damage or detachments--25g PPV with membrane peel, endolaser, possible gas vs. Silicon oil, OS, under general anesthesia.  Past Medical History:  Diagnosis Date   Atrial fibrillation with RVR ([place])    Bigeminy 01/2020   CHF (congestive heart failure) ([place]) 10/20/2019   CKD (chronic kidney disease), stage IV (HCC)    Diabetes mellitus without complication ([place])    DOE (dyspnea on exertion)    walking upstairs or up hill resolves in one minute   Fibroids    History of kidney stones    History of recent blood transfusion 02/26/2020   Hypertension    Iron deficiency anemia    Non-ischemic cardiomyopathy (HCC)    tachycardia induced   Obese    Peripheral edema    Premature ventricular contractions (PVCs) (VPCs)    Stroke ([place]) 00/9233   Umbilical hernia    Wears glasses     Past Surgical History:  Procedure Laterality Date   CHOLECYSTECTOMY     CYSTOSCOPY W/ URETERAL STENT PLACEMENT Bilateral 05/24/2020   Procedure: CYSTOSCOPY WITH RETROGRADE PYELOGRAM/URETERAL STENT PLACEMENT;  Surgeon: Robley Fries, MD;  [facility];  Service: Urology;  Laterality: Bilateral;   CYSTOSCOPY/RETROGRADE/URETEROSCOPY Bilateral 04/03/2020   Procedure: CYSTOSCOPY/RETROGRADE/URETEROSCOPY;  Surgeon: Ardis Hughs, MD;  [facility];  Service: Urology;  Laterality: Bilateral;   HYSTERECTOMY ABDOMINAL WITH SALPINGO-OOPHORECTOMY  07/21/2020   Procedure: HYSTERECTOMY ABDOMINAL WITH BILATERAL SALPINGO-OOPHORECTOMY;  Surgeon: Sanjuana Kava, MD;  Location: [facility];  Service: Gynecology;;   IR FLUORO GUIDE CV LINE RIGHT  02/21/2020   IR US GUIDE VASC ACCESS RIGHT  02/21/2020   NEPHROLITHOTOMY Left 02/14/2020   Procedure: NEPHROLITHOTOMY PERCUTANEOUS/ SURGEON ACCESS/ LEFT PERCUTANEOUS NEPHROSTOMY TUBE PLACEMENT;  Surgeon: Ardis Hughs, MD;  [facility];  Service: Urology;  Laterality: Left;   NEPHROLITHOTOMY Left 02/21/2020   Procedure: NEPHROLITHOTOMY PERCUTANEOUS SECOND LOOK;  Surgeon: Ardis Hughs, MD;  [facility];  Service: Urology;  Laterality: Left;   NEPHROLITHOTOMY Left 02/26/2020   Procedure: NEPHROLITHOTOMY PERCUTANEOUS;  Surgeon: Ceasar Mons, MD;  [facility];  Service: Urology;  Laterality: Left;  NEED 150 MIN   NEPHROLITHOTOMY Right 03/24/2020   Procedure: NEPHROLITHOTOMY PERCUTANEOUS WITH ACCESS LEFT STENT REMOVAL;  Surgeon: Ardis Hughs, MD;  [facility];  Service: Urology;  Laterality: Right;   RIGHT HEART CATH N/A 11/09/2019   Procedure: RIGHT HEART CATH;  Surgeon: Jolaine Artist, MD;  Location: [facility];  Service: Cardiovascular;  Laterality: N/A;   WISDOM TOOTH EXTRACTION      Family History  Problem Relation Age of Onset   Atrial fibrillation Mother    Hypertension Mother    Stroke Mother 74   Diabetes Mother    Diabetes Father    Hypertension Father    Diabetes Sister    Hypertension Sister    Diabetes Brother  Hypertension Brother    Diabetes Brother    Heart attack Brother    Stroke Maternal Grandmother 57   Social History:  reports that she has never smoked. She has never used smokeless tobacco. She reports that she does not drink alcohol and does not use drugs.  Allergies:  Allergies  Allergen Reactions   Adhesive [Tape]      Tears skin, can tolerate paper tape    No medications prior to admission.   Review of systems otherwise negative  There were no vitals taken for this visit.  Physical exam: Mental status: oriented x3. Eyes: See eye exam associated with this date of surgery Ears, Nose, Throat: within normal limits Neck: Within Normal limits General: within normal limits Chest: Within normal limits Breast: deferred Heart: Within normal limits Abdomen: Within normal limits GU: deferred Extremities: within normal limits Skin: within normal limits  Assessment/Plan Proliferative diabetic retinopathy with vitreous hemorrhage and probable tractional retinal detachment, LEFT EYE  Plan: To [employer] for 25g PPV w/ membrane peel, endolaser, gas vs silicone oil OS under general anesthesia - case scheduled for Thursday, 10.20.22, [facility] OR 08  Gardiner Sleeper, M.D., Ph.D. Vitreoretinal Surgeon [facility]

## 2021-10-05 NOTE — Telephone Encounter (Signed)
Patient with diagnosis of a fib on Pradaxa for anticoagulation.    Procedure: Left eye surgery hemorrhage and possible detachment. Date of procedure: 10/08/2021  CHA2DS2-VASc Score = 7  This indicates a 11.2% annual risk of stroke. The patient's score is based upon: CHF History: 1 HTN History: 1 Diabetes History: 1 Stroke History: 2 Vascular Disease History: 1 Age Score: 0 Gender Score: 1   CrCl 66 Platelet count 197  Pt previously took Eliquis but had CVA 12/24/20, considered treatment failure and changed to Pradaxa. Request is to hold Eliquis for 3 days pre-procedure and another 3 days after. Due to pt's elevated CV risk and long hold request, will need MD input.

## 2021-10-05 NOTE — Telephone Encounter (Signed)
Patient had a history of CVA in the setting of A. fib.  Failed Eliquis and was switched to Pradaxa.  Clinical pharmacist to review Pradaxa.  Note, surgery is not in November, she is actually scheduled for surgery in 3 days on 10/08/2021.  Also the request is to hold her blood thinner from today until Sunday which is 3 days after her procedure.

## 2021-10-05 NOTE — Telephone Encounter (Addendum)
   Name: Adrienne Lane  DOB: Oct 07, 1966  MRN: [id number]   Primary Cardiologist: [name], MD  Chart reviewed as part of pre-operative protocol coverage. Patient was contacted 10/05/2021 in reference to pre-operative risk assessment for pending surgery as outlined below.  Adrienne Lane was last seen on 04/30/2021 by Dr. [name].  Since that day, Adrienne Lane has done well without exertional chest pain or worsening dyspnea.  Therefore, based on ACC/AHA guidelines, the patient would be at acceptable risk for the planned procedure without further cardiovascular testing.   I discussed her case with DOD Dr. [name] today, given prior stroke, his recommendation is to hold Pradaxa for 2 days before the procedure and restart as early as possible afterward at the discretion of Dr. [name].  We did not recommend Lovenox bridging due to increased chance of retinal bleeding.  The patient was advised that if she develops new symptoms prior to surgery to contact our office to arrange for a follow-up visit, and she verbalized understanding.  I will route this recommendation to the requesting party via Epic fax function and remove from pre-op pool. Please call with questions.  Irene, [place] 10/05/2021, 5:53 PM

## 2021-10-05 NOTE — Telephone Encounter (Signed)
   [facility] Pre-operative Risk Assessment    Patient Name: Adrienne Lane  DOB: [birth year] MRN: [id number]  HEARTCARE STAFF:  - IMPORTANT!!!!!! Under Visit Info/Reason for Call, type in Other and utilize the format Clearance MM/DD/YY or Clearance TBD. Do not use dashes or single digits. - Please review there is not already an duplicate clearance open for this procedure. - If request is for dental extraction, please clarify the # of teeth to be extracted. - If the patient is currently at the dentist's office, call Pre-Op Callback Staff (MA/nurse) to input urgent request.  - If the patient is not currently in the dentist office, please route to the Pre-Op pool.  Request for surgical clearance:  What type of surgery is being performed? Left eye surgery hemorrhage and possible detachment.   When is this surgery scheduled? 11-10-21  What type of clearance is required (medical clearance vs. Pharmacy clearance to hold med vs. Both)? both  Are there any medications that need to be held prior to surgery and how long? Ok, to be off her blood thinner from today through Sunday  Practice name and name of physician performing surgery? [facility], Dr. Bernarda Caffey  What is the office phone number? [phone]   7.   What is the office fax number? [phone]  8.   Anesthesia type (None, local, MAC, general) ?    Ermelinda Das 10/05/2021, 1:18 PM  _________________________________________________________________   (provider comments below)

## 2021-10-07 ENCOUNTER — Encounter (HOSPITAL_COMMUNITY): Payer: Self-pay | Admitting: Ophthalmology

## 2021-10-07 NOTE — Progress Notes (Signed)
DUE TO COVID-19 ONLY ONE VISITOR IS ALLOWED TO COME WITH YOU AND STAY IN THE WAITING ROOM ONLY DURING PRE OP AND PROCEDURE DAY OF SURGERY.   PCP - [facility] - Dr [name]  Chest x-ray - 01/20/21 (2V) EKG - 04/22/21 Stress Test - n/a ECHO - 12/25/20 Cardiac Cath - 11/09/19  ICD Pacemaker/Loop - n/a  Sleep Study -  Yes CPAP - does not use cpap  Fasting Blood Sugar - 110s - 150s Checks Blood Sugar 1 times a day  Do not take Novolog Insulin on day of surgery unless your CBG is greater than 220 mg/dl.  If CBG greater than 220 mg/dl, you may take half of your sliding scale correction dose of insulin,      THE MORNING OF SURGERY, take 11 units of Lantus Insulin.     If your blood sugar is less than 70 mg/dL, you will need to treat for low blood sugar: Treat a low blood sugar (less than 70 mg/dL) with  cup of clear juice (cranberry or apple), 4 glucose tablets, OR glucose gel. Recheck blood sugar in 15 minutes after treatment (to make sure it is greater than 70 mg/dL). If your blood sugar is not greater than 70 mg/dL on recheck, call [phone] for further instructions.  Aspirin/Blood Thinner Instructions:  Follow your surgeon's instructions on when to stop Aspirin prior to surgery.  Per MD's written instructions, hold Pradaxa two days prior to surgery.  Last dose of Pradaxa was as on 10/06/21.  Anesthesia review: Yes  STOP now taking any Aspirin (unless otherwise instructed by your surgeon), Aleve, Naproxen, Ibuprofen, Motrin, Advil, Goody's, BC's, all herbal medications, fish oil, and all vitamins.   Coronavirus Screening Covid test n/a Ambulatory Surgery  Do you have any of the following symptoms:  Cough yes/no: No Fever (>100.34F)  yes/no: No Runny nose yes/no: No Sore throat yes/no: No Difficulty breathing/shortness of breath  yes/no: No  Have you traveled in the last 14 days and where? yes/no: No  Patient verbalized understanding of  instructions that were given via phone.

## 2021-10-07 NOTE — Anesthesia Preprocedure Evaluation (Deleted)
Anesthesia Evaluation    Airway        Dental   Pulmonary           Cardiovascular hypertension,      Neuro/Psych    GI/Hepatic   Endo/Other  diabetes  Renal/GU      Musculoskeletal   Abdominal   Peds  Hematology   Anesthesia Other Findings   Reproductive/Obstetrics                             Anesthesia Physical Anesthesia Plan  ASA:   Anesthesia Plan:    Post-op Pain Management:    Induction:   PONV Risk Score and Plan:   Airway Management Planned:   Additional Equipment:   Intra-op Plan:   Post-operative Plan:   Informed Consent:   Plan Discussed with:   Anesthesia Plan Comments: (PAT note written 10/07/2021 by Myra Gianotti, PA-C. )        Anesthesia Quick Evaluation

## 2021-10-07 NOTE — Progress Notes (Signed)
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  History includes never smoker, HTN, PAF (diagnosed 10/2019, switched from Eliquis to Pradaxa after 12/2020 CVA), frequent PVCs (21% 03/2021 Z[place]; per EP 05/05/21, consider ablation if more symptomatic/EF worsens), chronic systolic HF (acute diagnosis 10/2019), cardiomyopathy (possible tachy-mediated versus PVC-mediated; no LHC 10/2019 due to CKD), CVA (12/24/20), CKD (stage 3b; AKI with bilateral hydronephrosis, s/p bilateral ureteral stents 05/24/20), uterine fibroids (s/p hysterectomy/BSO 07/21/20), COVID-19 (01/03/21). Possible OSA - HF cardiology planning to order sleep study when she gets on Medicaid.  Preoperative cardiology input outline on 10/05/21 by [name], [name], "Patient was contacted 10/05/2021 in reference to pre-operative risk assessment for pending surgery as outlined below.  Adrienne Lane was last seen on 04/30/2021 by Dr. [name].  Since that day, [name] has done well without exertional chest pain or worsening dyspnea.   Therefore, based on ACC/AHA guidelines, the patient would be at acceptable risk for the planned procedure without further cardiovascular testing.    I discussed her case with DOD Dr. [name] today, given prior stroke, his recommendation is to hold Pradaxa for 2 days before the procedure and restart as early as possible afterward at the discretion of Dr. [name].  We did not recommend Lovenox bridging due to increased chance of retinal bleeding."  PAT phone RN interview is still pending. She is a same day work-up. Anesthesia team to evalaute on the day of surgery.    VS: BP Readings from Last  3 Encounters:  09/01/21 123/71  06/14/21 (!) 150/86  05/22/21 (!) 144/84   Pulse Readings from Last 3 Encounters:  09/01/21 67  06/14/21 (!) 57  05/22/21 (!) 59     PROVIDERS: Inc, [facility] is PCP  - [name], MD is HF cardiologist. Last visit 04/22/21.  [name], MD is EP cardiologist. Last visit 05/05/21. He wrote, "Frequent PVC's - she is asymptomatic and her EF is improved. Her PVC's are left sided probably around the LV OT, possibly in the root. We discussed catheter ablation. With her EF improving, and because she is asymptomatic, I think the risk benefit of ablation makes the procedure contra-indicated. I have explained this carefully to the patient. If she were to develop symptoms or her EF were to go down, then ablation or a re-trial of amiodarone would be recommended." - [name], MD is primary cardiologist, although primarily being followed by HF and EP. - [name], MD is neurologist - [name], MD is nephrologist. 02/27/21 office visit is scanned under Media tab. GFR stable, six month follow-up planned.  [name], MD is urologist   LABS: Labs as indicated on the day of surgery. As of 06/14/21, Cr 1.69, CBC normal. A1c 8.0% on 05/22/21.   IMAGES: CXR 01/20/21: FINDINGS: The heart size and mediastinal contours are within normal limits. Both lungs are clear. The visualized skeletal structures are unremarkable. IMPRESSION: No active cardiopulmonary disease.  MRI Brain 03/10/21: IMPRESSION: No acute finding by MRI. Previously seen restricted diffusion/acute infarction in the right para median pons and left frontal white matter have undergone expected evolutionary change. Chronic small-vessel ischemic changes elsewhere  throughout the brain as outlined above.  MRA Head 12/24/20: IMPRESSION: Normal intracranial MRA. No large vessel occlusion, hemodynamically significant stenosis, or other acute vascular  abnormality.    EKG: 04/22/21: Sinus rhythm with Premature supraventricular complexes and with frequent Premature ventricular complexes Left ventricular hypertrophy with repolarization abnormality Compared to previous EKG ST changes ar present Confirmed by Dorris Carnes [phone]) on 04/22/2021 4:09:36 PM   CV: Long term event monitor 03/17/21-03/21/21: Study Highlights Patch Wear Time:  3 days and 21 hours (2022-03-29T10:42:43-0400 to 2022-04-02T08:38:25-0400) 1. Sinus rhythm - avg HR of 78 bpm.  2  Two runs of nonsustained ventricular tachycardia occurred. The longest  lasting 4 beats with an avg rate of 112 bpm.  3. 740 runs of SVT occurred, the longest lasting 15 beats with an avg rate of 104 bpm. 4. Isolated PACs were frequent (5.6%, 73428), SVE Couplets were occasional (1.8%, 4226), and SVE Triplets were occasional (1.1%, 1664).  5. Isolated PVCs were frequent (21.0%, 76811)    Echo 12/25/20: IMPRESSIONS   1. Since the last study on 06/16/20 LVEF remains mildly decreased with  diffuse hypokinesis and LVEF 45-50%. RVEF is mildly decreased, RV is now  less dilated and RVSP has improved from 50 to 30 mmHg.   2. Left ventricular ejection fraction, by estimation, is 45 to 50%. The  left ventricle has mildly decreased function. The left ventricle  demonstrates global hypokinesis. Left ventricular diastolic function could  not be evaluated.   3. Right ventricular systolic function is mildly reduced. The right  ventricular size is normal. There is normal pulmonary artery systolic  pressure. The estimated right ventricular systolic pressure is 57.2 mmHg.   4. Left atrial size was mildly dilated.   5. The mitral valve is normal in structure. Mild mitral valve  regurgitation. No evidence of mitral stenosis.   6. The aortic valve is normal in structure. Aortic valve regurgitation is  not visualized. No aortic stenosis is present.   7. The inferior vena cava is normal in size with <50% respiratory   variability, suggesting right atrial pressure of 8 mmHg.  - Comparison 06/16/20: LVEF 50-55%, global LV hypokinesis, grade II DD, normal RVSF, moderately elevated PASP, estimated RVSP 50.3 mmHg, moderately dilated LF, mild-moderate MR/TR   US Carotid 12/25/20: Summary:  - Right Carotid: The extracranial vessels were near-normal with only minimal wall thickening or plaque.  - Left Carotid: The extracranial vessels were near-normal with only minimal wall thickening or plaque.  - Vertebrals: Bilateral vertebral arteries demonstrate antegrade flow.    [place] 11/09/19 (Bensimhon, Daniiel, MD): Findings: RA = 11 RV = 47/15 PA = 49/17 (32) PCW = 20 Fick cardiac output/index = 8.8/3.8 PVR = 1.4 WU FA sat = 97% PA sat = 70%, 72% High SVC = 68%   Assessment: 1. 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    Past Medical History:  Diagnosis Date   Atrial fibrillation with RVR ([place])    [place] 01/2020   CHF (congestive heart failure) ([place]) 10/20/2019   CKD (chronic kidney disease), stage IV (HCC)    Diabetes mellitus without complication (HCC)    DOE (dyspnea on exertion)    walking upstairs or up hill resolves in one minute   Dysrhythmia    a-fib - on pradaxa   Fibroids    History of kidney stones  History of recent blood transfusion 02/26/2020   Hypertension    Iron deficiency anemia    Non-ischemic cardiomyopathy (HCC)    tachycardia induced   Obese    Peripheral edema    Premature ventricular contractions (PVCs) ([facility])    Stroke ([place]) 15/1761   Umbilical hernia    Wears glasses     Past Surgical History:  Procedure Laterality Date   CHOLECYSTECTOMY     CYSTOSCOPY W/ URETERAL STENT PLACEMENT Bilateral 05/24/2020   Procedure: CYSTOSCOPY WITH RETROGRADE PYELOGRAM/URETERAL STENT PLACEMENT;  Surgeon: Robley Fries, MD;  Location: [facility];   Service: Urology;  Laterality: Bilateral;   CYSTOSCOPY/RETROGRADE/URETEROSCOPY Bilateral 04/03/2020   Procedure: CYSTOSCOPY/RETROGRADE/URETEROSCOPY;  Surgeon: Ardis Hughs, MD;  Location: [facility];  Service: Urology;  Laterality: Bilateral;   HYSTERECTOMY ABDOMINAL WITH SALPINGO-OOPHORECTOMY  07/21/2020   Procedure: HYSTERECTOMY ABDOMINAL WITH BILATERAL SALPINGO-OOPHORECTOMY;  Surgeon: Sanjuana Kava, MD;  Location: [place];  Service: Gynecology;;   IR FLUORO GUIDE CV LINE RIGHT  02/21/2020   IR US GUIDE VASC ACCESS RIGHT  02/21/2020   NEPHROLITHOTOMY Left 02/14/2020   Procedure: NEPHROLITHOTOMY PERCUTANEOUS/ SURGEON ACCESS/ LEFT PERCUTANEOUS NEPHROSTOMY TUBE PLACEMENT;  Surgeon: Ardis Hughs, MD;  Location: [facility];  Service: Urology;  Laterality: Left;   NEPHROLITHOTOMY Left 02/21/2020   Procedure: NEPHROLITHOTOMY PERCUTANEOUS SECOND LOOK;  Surgeon: Ardis Hughs, MD;  Location: [facility];  Service: Urology;  Laterality: Left;   NEPHROLITHOTOMY Left 02/26/2020   Procedure: NEPHROLITHOTOMY PERCUTANEOUS;  Surgeon: Ceasar Mons, MD;  Location: [facility];  Service: Urology;  Laterality: Left;  NEED 150 MIN   NEPHROLITHOTOMY Right 03/24/2020   Procedure: NEPHROLITHOTOMY PERCUTANEOUS WITH ACCESS LEFT STENT REMOVAL;  Surgeon: Ardis Hughs, MD;  Location: [facility];  Service: Urology;  Laterality: Right;   RIGHT HEART CATH N/A 11/09/2019   Procedure: RIGHT HEART CATH;  Surgeon: Jolaine Artist, MD;  Location: [place] [facility];  Service: Cardiovascular;  Laterality: N/A;   WISDOM TOOTH EXTRACTION      MEDICATIONS: No current facility-administered medications for this encounter.    acetaminophen (TYLENOL) 500 MG tablet   amLODipine (NORVASC) 10 MG tablet   aspirin 81 MG chewable tablet   atorvastatin (LIPITOR) 80 MG tablet   carvedilol (COREG) 6.25 MG tablet   cholecalciferol (VITAMIN D3) 25 MCG (1000 UNIT) tablet   dabigatran (PRADAXA) 150 MG CAPS capsule   dapagliflozin  propanediol (FARXIGA) 10 MG TABS tablet   hydrALAZINE (APRESOLINE) 100 MG tablet   insulin aspart (NOVOLOG) 100 UNIT/ML injection   insulin glargine (LANTUS) 100 UNIT/ML injection   isosorbide mononitrate (IMDUR) 60 MG 24 hr tablet   mexiletine (MEXITIL) 150 MG capsule   Multiple Vitamins-Minerals (WOMENS MULTI PO)   potassium chloride (KLOR-CON) 10 MEQ tablet   prednisoLONE acetate (PRED FORTE) 1 % ophthalmic suspension   sacubitril-valsartan (ENTRESTO) 24-26 MG   glucose blood (TRUE METRIX BLOOD GLUCOSE TEST) test strip   Insulin Syringe-Needle U-100 (TRUEPLUS INSULIN SYRINGE) 30G X 5/16" 0.3 ML MISC   Insulin Syringe-Needle U-100 30G X 5/16" 0.5 ML MISC   Lancets (ONETOUCH ULTRASOFT) lancets   TRUEplus Lancets 28G MISC    Myra Gianotti, PA-C Surgical Short Stay/Anesthesiology [facility] Phone [phone] [facility] Phone [phone] 10/07/2021 2:26 PM

## 2021-10-08 ENCOUNTER — Other Ambulatory Visit: Payer: Self-pay

## 2021-10-08 ENCOUNTER — Encounter (HOSPITAL_COMMUNITY)
Admission: RE | Disposition: A | Discharge status: Home / Self Care | Payer: Self-pay | Source: Home / Self Care | Attending: Ophthalmology

## 2021-10-08 ENCOUNTER — Encounter (HOSPITAL_COMMUNITY): Payer: Self-pay | Admitting: Ophthalmology

## 2021-10-08 ENCOUNTER — Ambulatory Visit (HOSPITAL_COMMUNITY): Payer: Self-pay | Admitting: Vascular Surgery

## 2021-10-08 ENCOUNTER — Ambulatory Visit (HOSPITAL_COMMUNITY)
Admission: RE | Admit: 2021-10-08 | Discharge: 2021-10-08 | Disposition: A | Discharge status: Home / Self Care | Payer: Self-pay | Attending: Ophthalmology | Admitting: Ophthalmology

## 2021-10-08 DIAGNOSIS — Z794 Long term (current) use of insulin: Secondary | ICD-10-CM | POA: Insufficient documentation

## 2021-10-08 DIAGNOSIS — E1165 Type 2 diabetes mellitus with hyperglycemia: Secondary | ICD-10-CM | POA: Insufficient documentation

## 2021-10-08 DIAGNOSIS — H3342 Traction detachment of retina, left eye: Secondary | ICD-10-CM | POA: Insufficient documentation

## 2021-10-08 DIAGNOSIS — Z7901 Long term (current) use of anticoagulants: Secondary | ICD-10-CM | POA: Insufficient documentation

## 2021-10-08 DIAGNOSIS — N184 Chronic kidney disease, stage 4 (severe): Secondary | ICD-10-CM | POA: Insufficient documentation

## 2021-10-08 DIAGNOSIS — I4891 Unspecified atrial fibrillation: Secondary | ICD-10-CM | POA: Insufficient documentation

## 2021-10-08 DIAGNOSIS — Z8673 Personal history of transient ischemic attack (TIA), and cerebral infarction without residual deficits: Secondary | ICD-10-CM | POA: Insufficient documentation

## 2021-10-08 DIAGNOSIS — H4312 Vitreous hemorrhage, left eye: Secondary | ICD-10-CM | POA: Diagnosis not present

## 2021-10-08 DIAGNOSIS — Z7984 Long term (current) use of oral hypoglycemic drugs: Secondary | ICD-10-CM | POA: Insufficient documentation

## 2021-10-08 DIAGNOSIS — I429 Cardiomyopathy, unspecified: Secondary | ICD-10-CM | POA: Insufficient documentation

## 2021-10-08 DIAGNOSIS — I5022 Chronic systolic (congestive) heart failure: Secondary | ICD-10-CM | POA: Insufficient documentation

## 2021-10-08 DIAGNOSIS — Z79899 Other long term (current) drug therapy: Secondary | ICD-10-CM | POA: Insufficient documentation

## 2021-10-08 DIAGNOSIS — Z8616 Personal history of COVID-19: Secondary | ICD-10-CM | POA: Insufficient documentation

## 2021-10-08 DIAGNOSIS — Z91048 Other nonmedicinal substance allergy status: Secondary | ICD-10-CM | POA: Insufficient documentation

## 2021-10-08 DIAGNOSIS — E113592 Type 2 diabetes mellitus with proliferative diabetic retinopathy without macular edema, left eye: Secondary | ICD-10-CM | POA: Insufficient documentation

## 2021-10-08 DIAGNOSIS — E1122 Type 2 diabetes mellitus with diabetic chronic kidney disease: Secondary | ICD-10-CM | POA: Insufficient documentation

## 2021-10-08 DIAGNOSIS — Z7982 Long term (current) use of aspirin: Secondary | ICD-10-CM | POA: Insufficient documentation

## 2021-10-08 DIAGNOSIS — I130 Hypertensive heart and chronic kidney disease with heart failure and stage 1 through stage 4 chronic kidney disease, or unspecified chronic kidney disease: Secondary | ICD-10-CM | POA: Insufficient documentation

## 2021-10-08 HISTORY — PX: PHOTOCOAGULATION WITH LASER: SHX6027

## 2021-10-08 HISTORY — PX: MEMBRANE PEEL: SHX5967

## 2021-10-08 HISTORY — DX: Cardiac arrhythmia, unspecified: I49.9

## 2021-10-08 HISTORY — PX: INJECTION OF SILICONE OIL: SHX6422

## 2021-10-08 HISTORY — PX: PARS PLANA VITRECTOMY: SHX2166

## 2021-10-08 HISTORY — DX: Sleep apnea, unspecified: G47.30

## 2021-10-08 LAB — POCT I-STAT, CHEM 8
BUN: 30 mg/dL — ABNORMAL HIGH (ref 6–20)
Calcium, Ion: 1.17 mmol/L (ref 1.15–1.40)
Chloride: 111 mmol/L (ref 98–111)
Creatinine, Ser: 2 mg/dL — ABNORMAL HIGH (ref 0.44–1.00)
Glucose, Bld: 156 mg/dL — ABNORMAL HIGH (ref 70–99)
HCT: 35 % — ABNORMAL LOW (ref 36.0–46.0)
Hemoglobin: 11.9 g/dL — ABNORMAL LOW (ref 12.0–15.0)
Potassium: 3.7 mmol/L (ref 3.5–5.1)
Sodium: 146 mmol/L — ABNORMAL HIGH (ref 135–145)
TCO2: 24 mmol/L (ref 22–32)

## 2021-10-08 LAB — BASIC METABOLIC PANEL
Anion gap: 10 (ref 5–15)
BUN: 32 mg/dL — ABNORMAL HIGH (ref 6–20)
CO2: 22 mmol/L (ref 22–32)
Calcium: 9 mg/dL (ref 8.9–10.3)
Chloride: 110 mmol/L (ref 98–111)
Creatinine, Ser: 2.02 mg/dL — ABNORMAL HIGH (ref 0.44–1.00)
GFR, Estimated: 29 mL/min — ABNORMAL LOW (ref >60)
Glucose, Bld: 159 mg/dL — ABNORMAL HIGH (ref 70–99)
Potassium: 3.9 mmol/L (ref 3.5–5.1)
Sodium: 142 mmol/L (ref 135–145)

## 2021-10-08 LAB — GLUCOSE, CAPILLARY
Glucose-Capillary: 151 mg/dL — ABNORMAL HIGH (ref 70–99)
Glucose-Capillary: 172 mg/dL — ABNORMAL HIGH (ref 70–99)

## 2021-10-08 LAB — CBC
HCT: 38.8 % (ref 36.0–46.0)
Hemoglobin: 12.7 g/dL (ref 12.0–15.0)
MCH: 29.1 pg (ref 26.0–34.0)
MCHC: 32.7 g/dL (ref 30.0–36.0)
MCV: 88.8 fL (ref 80.0–100.0)
Platelets: 204 10*3/uL (ref 150–400)
RBC: 4.37 MIL/uL (ref 3.87–5.11)
RDW: 13.3 % (ref 11.5–15.5)
WBC: 6.6 10*3/uL (ref 4.0–10.5)
nRBC: 0 % (ref 0.0–0.2)

## 2021-10-08 SURGERY — PARS PLANA VITRECTOMY WITH 25 GAUGE
Anesthesia: General | Site: Eye | Laterality: Left

## 2021-10-08 MED ORDER — MIDAZOLAM HCL 2 MG/2ML IJ SOLN
INTRAMUSCULAR | Status: AC
  Filled 2021-10-08: qty 2

## 2021-10-08 MED ORDER — PREDNISOLONE ACETATE 1 % OP SUSP
OPHTHALMIC | Status: AC
  Filled 2021-10-08: qty 5

## 2021-10-08 MED ORDER — FENTANYL CITRATE (PF) 250 MCG/5ML IJ SOLN
INTRAMUSCULAR | Status: AC
  Filled 2021-10-08: qty 5

## 2021-10-08 MED ORDER — PREDNISOLONE ACETATE 1 % OP SUSP
OPHTHALMIC | Status: DC | PRN
  Administered 2021-10-08: 1 [drp] via OPHTHALMIC

## 2021-10-08 MED ORDER — EPHEDRINE SULFATE-NACL 50-0.9 MG/10ML-% IV SOSY
PREFILLED_SYRINGE | INTRAVENOUS | Status: DC | PRN
  Administered 2021-10-08 (×2): 10 mg via INTRAVENOUS

## 2021-10-08 MED ORDER — EPINEPHRINE PF 1 MG/ML IJ SOLN
INTRAMUSCULAR | Status: AC
  Filled 2021-10-08: qty 1

## 2021-10-08 MED ORDER — PROPOFOL 10 MG/ML IV BOLUS
INTRAVENOUS | Status: DC | PRN
  Administered 2021-10-08: 130 mg via INTRAVENOUS

## 2021-10-08 MED ORDER — TROPICAMIDE 1 % OP SOLN
1.0000 [drp] | OPHTHALMIC | Status: AC | PRN
  Administered 2021-10-08 (×3): 1 [drp] via OPHTHALMIC
  Filled 2021-10-08: qty 15

## 2021-10-08 MED ORDER — BUPIVACAINE HCL (PF) 0.75 % IJ SOLN
INTRAMUSCULAR | Status: AC
  Filled 2021-10-08: qty 10

## 2021-10-08 MED ORDER — BSS IO SOLN
INTRAOCULAR | Status: AC
  Filled 2021-10-08: qty 15

## 2021-10-08 MED ORDER — ONDANSETRON HCL 4 MG/2ML IJ SOLN
INTRAMUSCULAR | Status: AC
  Filled 2021-10-08: qty 2

## 2021-10-08 MED ORDER — PHENYLEPHRINE 40 MCG/ML (10ML) SYRINGE FOR IV PUSH (FOR BLOOD PRESSURE SUPPORT)
PREFILLED_SYRINGE | INTRAVENOUS | Status: AC
  Filled 2021-10-08: qty 10

## 2021-10-08 MED ORDER — NA CHONDROIT SULF-NA HYALURON 40-30 MG/ML IO SOSY
INTRAOCULAR | Status: AC
  Filled 2021-10-08: qty 0.5

## 2021-10-08 MED ORDER — ATROPINE SULFATE 1 % OP SOLN
OPHTHALMIC | Status: AC
  Filled 2021-10-08: qty 5

## 2021-10-08 MED ORDER — PROPARACAINE HCL 0.5 % OP SOLN
1.0000 [drp] | OPHTHALMIC | Status: AC | PRN
  Administered 2021-10-08 (×3): 1 [drp] via OPHTHALMIC
  Filled 2021-10-08: qty 15

## 2021-10-08 MED ORDER — SODIUM CHLORIDE 0.9 % IV SOLN: INTRAVENOUS | Status: DC

## 2021-10-08 MED ORDER — NA CHONDROIT SULF-NA HYALURON 40-30 MG/ML IO SOSY
INTRAOCULAR | Status: DC | PRN
  Administered 2021-10-08: 0.5 mL via INTRAOCULAR

## 2021-10-08 MED ORDER — BACITRACIN-POLYMYXIN B 500-10000 UNIT/GM OP OINT
TOPICAL_OINTMENT | OPHTHALMIC | Status: DC | PRN
  Administered 2021-10-08: 1 via OPHTHALMIC

## 2021-10-08 MED ORDER — DORZOLAMIDE HCL-TIMOLOL MAL 2-0.5 % OP SOLN
OPHTHALMIC | Status: DC | PRN
  Administered 2021-10-08: 1 [drp] via OPHTHALMIC

## 2021-10-08 MED ORDER — FENTANYL CITRATE (PF) 250 MCG/5ML IJ SOLN
INTRAMUSCULAR | Status: DC | PRN
  Administered 2021-10-08 (×3): 50 ug via INTRAVENOUS

## 2021-10-08 MED ORDER — LIDOCAINE 2% (20 MG/ML) 5 ML SYRINGE
INTRAMUSCULAR | Status: AC
  Filled 2021-10-08: qty 5

## 2021-10-08 MED ORDER — MIDAZOLAM HCL 5 MG/5ML IJ SOLN
INTRAMUSCULAR | Status: DC | PRN
Start: 2021-10-08 — End: 2021-10-08
  Administered 2021-10-08: 2 mg via INTRAVENOUS

## 2021-10-08 MED ORDER — TOBRAMYCIN-DEXAMETHASONE 0.3-0.1 % OP OINT
TOPICAL_OINTMENT | OPHTHALMIC | Status: AC
  Filled 2021-10-08: qty 3.5

## 2021-10-08 MED ORDER — TRIAMCINOLONE ACETONIDE 40 MG/ML IJ SUSP
INTRAMUSCULAR | Status: DC | PRN
  Administered 2021-10-08: .5 mg

## 2021-10-08 MED ORDER — BACITRACIN-POLYMYXIN B 500-10000 UNIT/GM OP OINT
TOPICAL_OINTMENT | OPHTHALMIC | Status: AC
  Filled 2021-10-08: qty 3.5

## 2021-10-08 MED ORDER — SODIUM CHLORIDE (PF) 0.9 % IJ SOLN
INTRAMUSCULAR | Status: AC
  Filled 2021-10-08: qty 10

## 2021-10-08 MED ORDER — POLYMYXIN B SULFATE 500000 UNITS IJ SOLR
INTRAMUSCULAR | Status: AC
  Filled 2021-10-08: qty 500000

## 2021-10-08 MED ORDER — DEXAMETHASONE SODIUM PHOSPHATE 10 MG/ML IJ SOLN
INTRAMUSCULAR | Status: AC
  Filled 2021-10-08: qty 1

## 2021-10-08 MED ORDER — LACTATED RINGERS IV SOLN: INTRAVENOUS | Status: DC

## 2021-10-08 MED ORDER — EPINEPHRINE PF 1 MG/ML IJ SOLN
INTRAOCULAR | Status: DC | PRN
  Administered 2021-10-08 (×2): 500.3 mL

## 2021-10-08 MED ORDER — BRIMONIDINE TARTRATE 0.2 % OP SOLN
OPHTHALMIC | Status: DC | PRN
  Administered 2021-10-08: 1 [drp] via OPHTHALMIC

## 2021-10-08 MED ORDER — BSS PLUS IO SOLN
INTRAOCULAR | Status: AC
  Filled 2021-10-08: qty 1000

## 2021-10-08 MED ORDER — PROPOFOL 10 MG/ML IV BOLUS
INTRAVENOUS | Status: AC
  Filled 2021-10-08: qty 20

## 2021-10-08 MED ORDER — BRIMONIDINE TARTRATE 0.2 % OP SOLN
OPHTHALMIC | Status: AC
  Filled 2021-10-08: qty 5

## 2021-10-08 MED ORDER — GATIFLOXACIN 0.5 % OP SOLN
OPHTHALMIC | Status: AC
  Filled 2021-10-08: qty 2.5

## 2021-10-08 MED ORDER — DEXAMETHASONE SODIUM PHOSPHATE 10 MG/ML IJ SOLN
INTRAMUSCULAR | Status: DC | PRN
  Administered 2021-10-08: 5 mg via INTRAVENOUS

## 2021-10-08 MED ORDER — LIDOCAINE HCL (PF) 2 % IJ SOLN
INTRAMUSCULAR | Status: AC
  Filled 2021-10-08: qty 10

## 2021-10-08 MED ORDER — STERILE WATER FOR INJECTION IJ SOLN
INTRAMUSCULAR | Status: DC | PRN
  Administered 2021-10-08: .5 mL

## 2021-10-08 MED ORDER — ONDANSETRON HCL 4 MG/2ML IJ SOLN
INTRAMUSCULAR | Status: DC | PRN
  Administered 2021-10-08: 4 mg via INTRAVENOUS

## 2021-10-08 MED ORDER — GATIFLOXACIN 0.5 % OP SOLN OPTIME - NO CHARGE
OPHTHALMIC | Status: DC | PRN
  Administered 2021-10-08: 1 [drp] via OPHTHALMIC

## 2021-10-08 MED ORDER — CARBACHOL 0.01 % IO SOLN
INTRAOCULAR | Status: AC
  Filled 2021-10-08: qty 1.5

## 2021-10-08 MED ORDER — CEFTAZIDIME 1 G IJ SOLR
INTRAMUSCULAR | Status: AC
  Filled 2021-10-08: qty 1

## 2021-10-08 MED ORDER — ATROPINE SULFATE 1 % OP SOLN
1.0000 [drp] | OPHTHALMIC | Status: AC | PRN
  Administered 2021-10-08 (×3): 1 [drp] via OPHTHALMIC
  Filled 2021-10-08: qty 5

## 2021-10-08 MED ORDER — DORZOLAMIDE HCL-TIMOLOL MAL 2-0.5 % OP SOLN
OPHTHALMIC | Status: AC
  Filled 2021-10-08: qty 10

## 2021-10-08 MED ORDER — ROCURONIUM BROMIDE 10 MG/ML (PF) SYRINGE
PREFILLED_SYRINGE | INTRAVENOUS | Status: DC | PRN
  Administered 2021-10-08: 80 mg via INTRAVENOUS

## 2021-10-08 MED ORDER — PHENYLEPHRINE 40 MCG/ML (10ML) SYRINGE FOR IV PUSH (FOR BLOOD PRESSURE SUPPORT)
PREFILLED_SYRINGE | INTRAVENOUS | Status: DC | PRN
  Administered 2021-10-08: 80 ug via INTRAVENOUS
  Administered 2021-10-08: 40 ug via INTRAVENOUS
  Administered 2021-10-08: 80 ug via INTRAVENOUS

## 2021-10-08 MED ORDER — BRILLIANT BLUE G 0.025 % IO SOSY
0.5000 mL | PREFILLED_SYRINGE | INTRAOCULAR | Status: DC
  Filled 2021-10-08: qty 0.5

## 2021-10-08 MED ORDER — LIDOCAINE 2% (20 MG/ML) 5 ML SYRINGE
INTRAMUSCULAR | Status: DC | PRN
  Administered 2021-10-08: 100 mg via INTRAVENOUS

## 2021-10-08 MED ORDER — PHENYLEPHRINE HCL 10 % OP SOLN
1.0000 [drp] | OPHTHALMIC | Status: AC | PRN
  Administered 2021-10-08 (×3): 1 [drp] via OPHTHALMIC
  Filled 2021-10-08: qty 5

## 2021-10-08 MED ORDER — EPHEDRINE 5 MG/ML INJ
INTRAVENOUS | Status: AC
  Filled 2021-10-08: qty 5

## 2021-10-08 MED ORDER — BSS PLUS IO SOLN
INTRAOCULAR | Status: AC
  Filled 2021-10-08: qty 500

## 2021-10-08 MED ORDER — ORAL CARE MOUTH RINSE: 15.0000 mL | Freq: Once | OROMUCOSAL | Status: AC

## 2021-10-08 MED ORDER — PHENYLEPHRINE HCL-NACL 20-0.9 MG/250ML-% IV SOLN
INTRAVENOUS | Status: DC | PRN
  Administered 2021-10-08: 25 ug/min via INTRAVENOUS

## 2021-10-08 MED ORDER — ACETAZOLAMIDE SODIUM 500 MG IJ SOLR
INTRAMUSCULAR | Status: AC
  Filled 2021-10-08: qty 500

## 2021-10-08 MED ORDER — INDOCYANINE GREEN 25 MG IV SOLR
INTRAVENOUS | Status: AC
  Filled 2021-10-08: qty 10

## 2021-10-08 MED ORDER — TRIAMCINOLONE ACETONIDE 40 MG/ML IJ SUSP
INTRAMUSCULAR | Status: AC
  Filled 2021-10-08: qty 5

## 2021-10-08 MED ORDER — STERILE WATER FOR INJECTION IJ SOLN
INTRAMUSCULAR | Status: AC
  Filled 2021-10-08: qty 10

## 2021-10-08 MED ORDER — CHLORHEXIDINE GLUCONATE 0.12 % MT SOLN
15.0000 mL | Freq: Once | OROMUCOSAL | Status: AC
  Administered 2021-10-08: 15 mL via OROMUCOSAL
  Filled 2021-10-08: qty 15

## 2021-10-08 MED ORDER — ROCURONIUM BROMIDE 10 MG/ML (PF) SYRINGE
PREFILLED_SYRINGE | INTRAVENOUS | Status: AC
  Filled 2021-10-08: qty 10

## 2021-10-08 SURGICAL SUPPLY — 64 items
APPLICATOR COTTON TIP 6 STRL (MISCELLANEOUS) ×8 IMPLANT
APPLICATOR COTTON TIP 6IN STRL (MISCELLANEOUS) ×12
BAG COUNTER SPONGE SURGICOUNT (BAG) ×3 IMPLANT
BAND WRIST GAS GREEN (MISCELLANEOUS) IMPLANT
BLADE EYE CATARACT 19 1.4 BEAV (BLADE) IMPLANT
BNDG EYE OVAL (GAUZE/BANDAGES/DRESSINGS) ×3 IMPLANT
CABLE BIPOLOR RESECTION CORD (MISCELLANEOUS) ×3 IMPLANT
CANNULA ANT CHAM MAIN (OPHTHALMIC RELATED) IMPLANT
CANNULA FLEX TIP 25G (CANNULA) ×3 IMPLANT
CANNULA TROCAR 23 GA VLV (OPHTHALMIC) IMPLANT
CANNULA VLV SOFT TIP 25GA (OPHTHALMIC) IMPLANT
CLSR STERI-STRIP ANTIMIC 1/2X4 (GAUZE/BANDAGES/DRESSINGS) ×3 IMPLANT
DRAPE INCISE 51X51 W/FILM STRL (DRAPES) ×3 IMPLANT
DRAPE MICROSCOPE LEICA 46X105 (MISCELLANEOUS) ×3 IMPLANT
DRAPE OPHTHALMIC 77X100 STRL (CUSTOM PROCEDURE TRAY) ×3 IMPLANT
ERASER HMR WETFIELD 23G BP (MISCELLANEOUS) IMPLANT
FILTER BLUE MILLIPORE (MISCELLANEOUS) IMPLANT
FILTER STRAW FLUID ASPIR (MISCELLANEOUS) IMPLANT
FORCEPS GRIESHABER ILM 25G A (INSTRUMENTS) ×3 IMPLANT
FORCEPS GRIESHABER MAX 25G (MISCELLANEOUS) ×3 IMPLANT
GAS AUTO FILL CONSTEL (OPHTHALMIC)
GAS AUTO FILL CONSTELLATION (OPHTHALMIC) IMPLANT
GAS WRIST BAND GREEN (MISCELLANEOUS)
GOWN STRL REUS W/ TWL LRG LVL3 (GOWN DISPOSABLE) ×4 IMPLANT
GOWN STRL REUS W/ TWL XL LVL3 (GOWN DISPOSABLE) ×2 IMPLANT
GOWN STRL REUS W/TWL LRG LVL3 (GOWN DISPOSABLE) ×2
GOWN STRL REUS W/TWL XL LVL3 (GOWN DISPOSABLE) ×1
KIT BASIN OR (CUSTOM PROCEDURE TRAY) ×3 IMPLANT
KIT PERFLUORON PROCEDURE 5ML (MISCELLANEOUS) IMPLANT
LENS MACULAR ASPHERIC CONSTEL (OPHTHALMIC) IMPLANT
LENS VITRECTOMY FLAT OCLR DISP (MISCELLANEOUS) ×3 IMPLANT
LOOP FINESSE 25 GA (MISCELLANEOUS) IMPLANT
MICROPICK 25G (MISCELLANEOUS)
NEEDLE 18GX1X1/2 (RX/OR ONLY) (NEEDLE) ×3 IMPLANT
NEEDLE 25GX 5/8IN NON SAFETY (NEEDLE) ×9 IMPLANT
NEEDLE HYPO 30X.5 LL (NEEDLE) ×6 IMPLANT
NEEDLE PRECISIONGLIDE 27X1.5 (NEEDLE) IMPLANT
NS IRRIG 1000ML POUR BTL (IV SOLUTION) ×3 IMPLANT
OIL SILICONE OPHTHALMIC 1000 (Ophthalmic Related) ×3 IMPLANT
PACK VITRECTOMY CUSTOM (CUSTOM PROCEDURE TRAY) ×3 IMPLANT
PAD ARMBOARD 7.5X6 YLW CONV (MISCELLANEOUS) ×6 IMPLANT
PAK PIK VITRECTOMY CVS 25GA (OPHTHALMIC) ×3 IMPLANT
PENCIL BIPOLAR 25GA STR DISP (OPHTHALMIC RELATED) IMPLANT
PICK MICROPICK 25G (MISCELLANEOUS) IMPLANT
PROBE DIATHERMY DSP 27GA (MISCELLANEOUS) IMPLANT
PROBE ENDO DIATHERMY 25G (MISCELLANEOUS) ×3 IMPLANT
PROBE LASER ILLUM FLEX CVD 25G (OPHTHALMIC) ×3 IMPLANT
REPL STRA BRUSH NEEDLE (NEEDLE) IMPLANT
RESERVOIR BACK FLUSH (MISCELLANEOUS) IMPLANT
RETRACTOR IRIS FLEX 25G GRIESH (INSTRUMENTS) IMPLANT
SCISSORS TIP ADVANCED DSP 25GA (INSTRUMENTS) ×3 IMPLANT
SET INJECTOR OIL FLUID CONSTEL (OPHTHALMIC) ×3 IMPLANT
SHIELD EYE LENSE ONLY DISP (GAUZE/BANDAGES/DRESSINGS) ×3 IMPLANT
SPONGE SURGIFOAM ABS GEL 12-7 (HEMOSTASIS) IMPLANT
STOPCOCK 4 WAY LG BORE MALE ST (IV SETS) IMPLANT
SUT VICRYL 7 0 TG140 8 (SUTURE) ×3 IMPLANT
SYR 10ML LL (SYRINGE) ×3 IMPLANT
SYR 20ML LL LF (SYRINGE) ×3 IMPLANT
SYR 5ML LL (SYRINGE) ×3 IMPLANT
SYR BULB EAR ULCER 3OZ GRN STR (SYRINGE) ×3 IMPLANT
SYR TB 1ML LUER SLIP (SYRINGE) ×6 IMPLANT
TOWEL GREEN STERILE FF (TOWEL DISPOSABLE) ×3 IMPLANT
TUBING HIGH PRESS EXTEN 6IN (TUBING) ×3 IMPLANT
WATER STERILE IRR 1000ML POUR (IV SOLUTION) ×3 IMPLANT

## 2021-10-08 NOTE — Anesthesia Preprocedure Evaluation (Addendum)
Anesthesia Evaluation  Patient identified by MRN, date of birth, ID band Patient awake    Reviewed: Allergy & Precautions, NPO status , Patient's Chart, lab work & pertinent test results, reviewed documented beta blocker date and time   History of Anesthesia Complications Negative for: history of anesthetic complications  Airway Mallampati: II  TM Distance: >3 FB Neck ROM: Full    Dental  (+) Dental Advisory Given   Pulmonary neg pulmonary ROS,    Pulmonary exam normal        Cardiovascular hypertension, Pt. on medications and Pt. on home beta blockers +CHF  Normal cardiovascular exam+ dysrhythmias (on Eliquis) Atrial Fibrillation   Echo 12/25/20: IMPRESSIONS  1. Since the last study on 06/16/20 LVEF remains mildly decreased with  diffuse hypokinesis and LVEF 45-50%. RVEF is mildly decreased, RV is now  less dilated and RVSP has improved from 50 to 30 mmHg.  2. Left ventricular ejection fraction, by estimation, is 45 to 50%. The  left ventricle has mildly decreased function. The left ventricle  demonstrates global hypokinesis. Left ventricular diastolic function could  not be evaluated.  3. Right ventricular systolic function is mildly reduced. The right  ventricular size is normal. There is normal pulmonary artery systolic  pressure. The estimated right ventricular systolic pressure is 76.2 mmHg.  4. Left atrial size was mildly dilated.  5. The mitral valve is normal in structure. Mild mitral valve  regurgitation. No evidence of mitral stenosis.  6. The aortic valve is normal in structure. Aortic valve regurgitation is  not visualized. No aortic stenosis is present.  7. The inferior vena cava is normal in size with <50% respiratory  variability, suggesting right atrial pressure of 8 mmHg.  - Comparison 06/16/20: LVEF 50-55%, global LV hypokinesis, grade II DD, normal RVSF, moderately elevated PASP, estimated RVSP 50.3  mmHg, moderately dilated LF, mild-moderate MR/TR    Preoperative cardiology input outline on 10/05/21 by [name], PA, "Patient was contacted10/17/2022in reference to pre-operative risk assessment for pending surgery as outlined below. Vada Swift last seen on 5/12/2022by Dr. [name]. Since that day, Karyssa Amaral done well without exertional chest pain or worsening dyspnea.  Therefore, based on ACC/AHA guidelines, the patient would be at acceptable risk for the planned procedure without further cardiovascular testing.    Neuro/Psych negative neurological ROS  negative psych ROS   GI/Hepatic negative GI ROS, Neg liver ROS,   Endo/Other  diabetes, Type 2, Insulin Dependent  Renal/GU Renal InsufficiencyRenal disease   Uterine fibroids    Musculoskeletal negative musculoskeletal ROS (+)   Abdominal   Peds  Hematology  (+) anemia , Hgb 9.5   Anesthesia Other Findings Day of surgery medications reviewed with patient.  Reproductive/Obstetrics negative OB ROS                           Anesthesia Physical  Anesthesia Plan  ASA: 3  Anesthesia Plan: General   Post-op Pain Management:    Induction: Intravenous  PONV Risk Score and Plan: 4 or greater and Scopolamine patch - Pre-op, Midazolam, Ondansetron, Dexamethasone and Treatment may vary due to age or medical condition  Airway Management Planned: Oral ETT  Additional Equipment: None  Intra-op Plan:   Post-operative Plan: Extubation in OR  Informed Consent: I have reviewed the patients History and Physical, chart, labs and discussed the procedure including the risks, benefits and alternatives for the proposed anesthesia with the patient or authorized representative who has indicated his/her  understanding and acceptance.       Plan Discussed with: Anesthesiologist and CRNA  Anesthesia Plan Comments:        Anesthesia Quick Evaluation

## 2021-10-08 NOTE — Transfer of Care (Signed)
Immediate Anesthesia Transfer of Care Note  Patient: Adrienne Lane  Procedure(s) Performed: PARS PLANA VITRECTOMY WITH 25 GAUGE (Left: Eye) PHOTOCOAGULATION WITH LASER (Left: Eye) MEMBRANE PEEL (Left: Eye) INJECTION OF SILICONE OIL (Left: Eye)  Patient Location: PACU  Anesthesia Type:General  Level of Consciousness: awake and drowsy  Airway & Oxygen Therapy: Patient Spontanous Breathing and Patient connected to nasal cannula oxygen  Post-op Assessment: Report given to RN and Post -op Vital signs reviewed and stable  Post vital signs: Reviewed and stable  Last Vitals:  Vitals Value Taken Time  BP 132/63 10/08/21 1519  Temp    Pulse 83 10/08/21 1522  Resp 20 10/08/21 1522  SpO2 96 % 10/08/21 1522  Vitals shown include unvalidated device data.  Last Pain:  Vitals:   10/08/21 0951  TempSrc: Oral  PainSc: 0-No pain         Complications: No notable events documented.

## 2021-10-08 NOTE — Op Note (Signed)
Date of procedure:  10.20.2022   Surgeon: Bernarda Caffey, MD, PhD  Assistant: Leonie Douglas, Certified Ophthalmic Assistant   Pre-operative Diagnoses:  Proliferative diabetic retinopathy, left eye Vitreous hemorrhage, left eye   Post-operative diagnosis:  Proliferative diabetic retinopathy, left eye Vitreous hemorrhage, left eye Tractional retinal detachment, left eye   Anesthesia: General   Procedure:   1. 25 gauge pars plana vitrectomy, Left Eye 2. Diabetic vitreous membrane peel, Left Eye  3. Endolaser, Left Eye 4. Fluid Air Exchange, Left Eye 4. Injection [id number] silicon oil, Left Eye    Indications for procedure: The patient presented with history of decreased vision due to diabetic vitreous hemorrhage, left eye. After discussing the risks, benefits, and alternatives to surgery, the patient elected to undergo surgical repair and informed consent was obtained.  The surgery was an attempt to clear the vitreous hemorrhage and treat and/or repair any issues or defects contributing to the vitreous hemorrhage, within the reasonable expectations of the surgeon.    Procedure in Detail:    The patient was met in the pre-operative holding area where their identification data was verified.  It was noted that there was a signed, informed consent in the chart and the Left Eye eye was verbally verified by the patient as the operative eye and was marked with a marking pen. The patient was then taken to the operating room and placed in the supine position. General endotracheal anesthesia was induced.  The eye was then prepped with 5% betadine and draped in the normal fashion for ophthalmic surgery. The microscope was draped and swung into position, and a secondary time-out was performed to identify the correct patient, eyes, procedures, and any allergies.   A 25 gauge trocar was inserted in a 30-45 degrees fashion into the inferotemporal quadrant 3.5 mm posterior to the limbus in this pseudophakic  patient.  Correct positioning within the vitreous was verified externally with the light pipe.  The infusion was then connected to the cannula and BSS infusion was commenced.  Additional ports were placed in the superonasal and superotemporal quadrants. Viscoat was placed on the cornea and then vitrectomy of the anterior hyaloid and vitreous was performed via direct visualization. The BIOM was used to visualize the posterior segment while the core vitrectomy was completed. The patient had dense, diffuse vitreous hemorrhage throughout the vitreous cavity. Once the bulk of the vitreous hemorrhage was cleared, there was severe vitreous traction throughout the posterior pole with a 360 degree posterior tractional retinal detachment emanating from the optic disc. Kenalog was used to mark the vitreous and assist in dissecting vitreous membranes.      A macular contact lens was placed on the eye. End-grasping ILM forceps, MaxGrip forceps and the vitrectomy probe and light pipe were used to carefully peel and dissect preretinal membranes and fibrosis off the entire surface of the retina as was deemed safe. Notably, there were multiple stretch holes within the retina and there was significant intrinsic fibrosis within the retina that was unable to be completely peeled safely.   The wide angle viewing system was brought back into position to assist in peeling peripheral membranes. The stretch holes were marked with the fine point diathermy probe. At this time, peripheral panretinal photocoagulation was performed via endolaser 360 and posteriorly up to the retina that remained detached (almost to the arcades). An air fluid exchange was then performed to remove the intravitreal BSS and dry the retina via the various stretch holes. Then, endolaser was used to complete the  laser around the breaks and fill in PRP anteriorly and posteriorly to the existing laser. Next, 1216 cs silicon oil was injected via the superonasal trocar  and filled up to the level of the lens plane with the aide of the venting cannula.  The inferotemporal and superotemporal ports were then removed and sutured with 7-0 vicryl, there was no leakage. Then, the superonasal port was removed and sutured with 7-0 vicryl.  There was no leakage from the sclerotomy sites and the eye remained at physiologic pressure by digital palpation.   Subconjunctival injections of kefzol + bacitracin + polymixin b and kenalog were then administered. The drapes and lid speculum were removed and antibiotic and steroid drops as well as antibiotic ointment were placed in the eye and then the eye was patched and shielded.  The patient was then taken to the post-operative area for recovery having tolerated the procedure well.  She was instructed to perform face down positioning postoperatively and to follow up in clinic the following morning as scheduled.    Estimated blood lost: minimal Complications: None

## 2021-10-08 NOTE — Anesthesia Postprocedure Evaluation (Signed)
Anesthesia Post Note  Patient: Dulcemaria Bula  Procedure(s) Performed: PARS PLANA VITRECTOMY WITH 25 GAUGE (Left: Eye) PHOTOCOAGULATION WITH LASER (Left: Eye) MEMBRANE PEEL (Left: Eye) INJECTION OF SILICONE OIL (Left: Eye)     Patient location during evaluation: PACU Anesthesia Type: General Level of consciousness: sedated Pain management: pain level controlled Vital Signs Assessment: post-procedure vital signs reviewed and stable Respiratory status: spontaneous breathing and respiratory function stable Cardiovascular status: stable Postop Assessment: no apparent nausea or vomiting Anesthetic complications: no   No notable events documented.  Last Vitals:  Vitals:   10/08/21 1619 10/08/21 1634  BP: 127/67 (!) 130/59  Pulse: 78 85  Resp: (!) 22 18  Temp:  (!) 36.1 C  SpO2: 99% 99%    Last Pain:  Vitals:   10/08/21 1634  TempSrc:   PainSc: 0-No pain                 Omaya Nieland DANIEL

## 2021-10-08 NOTE — Interval H&P Note (Signed)
History and Physical Interval Note:  10/08/2021 10:28 AM  Adrienne Lane  has presented today for surgery, with the diagnosis of left eye, vitreous hemorrhage.  The various methods of treatment have been discussed with the patient and family. After consideration of risks, benefits and other options for treatment, the patient has consented to  Procedure(s): [place] (Left) as a surgical intervention.  The patient's history has been reviewed, patient examined, no change in status, stable for surgery.  I have reviewed the patient's chart and labs.  Questions were answered to the patient's satisfaction.     Bernarda Caffey

## 2021-10-08 NOTE — Anesthesia Procedure Notes (Signed)
Procedure Name: Intubation Date/Time: 10/08/2021 11:49 AM Performed by: Colin Benton, CRNA Pre-anesthesia Checklist: Patient identified, Emergency Drugs available, Suction available and Patient being monitored Patient Re-evaluated:Patient Re-evaluated prior to induction Oxygen Delivery Method: Circle system utilized Preoxygenation: Pre-oxygenation with 100% oxygen Induction Type: IV induction Ventilation: Mask ventilation without difficulty Laryngoscope Size: Miller and 2 Grade View: Grade I Tube type: Oral Tube size: 7.0 mm Number of attempts: 1 Airway Equipment and Method: Stylet Placement Confirmation: ETT inserted through vocal cords under direct vision, positive ETCO2 and breath sounds checked- equal and bilateral Secured at: 22 cm Tube secured with: Tape Dental Injury: Teeth and Oropharynx as per pre-operative assessment

## 2021-10-08 NOTE — Brief Op Note (Signed)
10/08/2021  3:36 PM  PATIENT:  Adrienne Lane  55 y.o. female  PRE-OPERATIVE DIAGNOSIS:  left eye, vitreous hemorrhage  POST-OPERATIVE DIAGNOSIS:  left eye, vitreous hemorrhage  PROCEDURE:  Procedure(s): PARS PLANA VITRECTOMY WITH 25 GAUGE (Left) PHOTOCOAGULATION WITH LASER (Left) MEMBRANE PEEL (Left) INJECTION OF SILICONE OIL (Left)  SURGEON:  Surgeon(s) and Role:    Bernarda Caffey, MD - Primary  ASSISTANTS: Leonie Douglas, COT   ANESTHESIA:   general  EBL:  2 mL   BLOOD ADMINISTERED:none  DRAINS: none   LOCAL MEDICATIONS USED:  NONE  SPECIMEN:  No Specimen  DISPOSITION OF SPECIMEN:  N/A  COUNTS:  YES  TOURNIQUET:  * No tourniquets in log *  DICTATION: .Note written in EPIC  PLAN OF CARE: Discharge to home after PACU  PATIENT DISPOSITION:  PACU - hemodynamically stable.   Delay start of Pharmacological VTE agent (>24hrs) due to surgical blood loss or risk of bleeding: not applicable

## 2021-10-08 NOTE — Discharge Instructions (Addendum)
POSTOPERATIVE INSTRUCTIONS  Your doctor has performed vitreoretinal surgery on you at [facility].  - Keep eye patched and shielded until seen by Dr. Zamora 8 AM tomorrow in clinic - Do not use drops until return - FACE DOWN POSITIONING WHILE AWAKE - Sleep with belly down or on right side, avoid laying flat on back.    - No strenuous bending, stooping or lifting.  - You may not drive until further notice.  - If your doctor used a gas bubble in your eye during the procedure he will advise you on postoperative positioning. If you have a gas bubble you will be wearing a green bracelet that was applied in the operating room. The green bracelet should stay on as long as the gas bubble is in your eye. While the gas bubble is present you should not fly in an airplane. If you require general anesthesia while the gas bubble is present you must notify your anesthesiologist that an intraocular gas bubble is present so he can take the appropriate precautions.  - Tylenol or any other over-the-counter pain reliever can be used according to your doctor. If more pain medicine is required, your doctor will have a prescription for you.  - You may read, go up and down stairs, and watch television.     Brian Zamora, M.D., Ph.D.  

## 2021-10-09 ENCOUNTER — Ambulatory Visit (INDEPENDENT_AMBULATORY_CARE_PROVIDER_SITE_OTHER): Payer: MEDICAID | Admitting: Ophthalmology

## 2021-10-09 ENCOUNTER — Encounter (HOSPITAL_COMMUNITY): Payer: Self-pay | Admitting: Ophthalmology

## 2021-10-09 DIAGNOSIS — H4312 Vitreous hemorrhage, left eye: Secondary | ICD-10-CM

## 2021-10-09 DIAGNOSIS — H3581 Retinal edema: Secondary | ICD-10-CM

## 2021-10-09 DIAGNOSIS — H33311 Horseshoe tear of retina without detachment, right eye: Secondary | ICD-10-CM

## 2021-10-09 DIAGNOSIS — Z961 Presence of intraocular lens: Secondary | ICD-10-CM

## 2021-10-09 DIAGNOSIS — I10 Essential (primary) hypertension: Secondary | ICD-10-CM

## 2021-10-09 DIAGNOSIS — E113593 Type 2 diabetes mellitus with proliferative diabetic retinopathy without macular edema, bilateral: Secondary | ICD-10-CM

## 2021-10-09 DIAGNOSIS — H35033 Hypertensive retinopathy, bilateral: Secondary | ICD-10-CM

## 2021-10-09 DIAGNOSIS — H25811 Combined forms of age-related cataract, right eye: Secondary | ICD-10-CM

## 2021-10-09 DIAGNOSIS — H3342 Traction detachment of retina, left eye: Secondary | ICD-10-CM

## 2021-10-09 NOTE — Progress Notes (Signed)
[facility] Note  10/09/2021     CHIEF COMPLAINT Patient presents for Retina Follow Up   HISTORY OF PRESENT ILLNESS: Adrienne Lane is a 55 y.o. female who presents to the clinic today for:  HPI     Retina Follow Up   Patient presents with  Other.  In left eye.  This started 1 day ago.  I, the attending physician,  performed the HPI with the patient and updated documentation appropriately.        Comments   Patient here for 1 day retina follow up for 1 day post op. Patient states sees light. Has eye pain. Hurt a little bit last night.       Last edited by Bernarda Caffey, MD on 10/09/2021 10:42 PM.    OS feels scratchy.  Had some minor pain last night that eased with otc Tylenol.  Referring physician: Debbra Riding, MD [address] [place],  [place] [address]  HISTORICAL INFORMATION:   Selected notes from the MEDICAL RECORD NUMBER Referred by Dr. Wyatt Portela for concern of VH   CURRENT MEDICATIONS: No current facility-administered medications for this visit. (Ophthalmic Drugs)   No current outpatient medications on file. (Ophthalmic Drugs)   No current facility-administered medications for this visit. (Other)   No current outpatient medications on file. (Other)   Facility-Administered Medications Ordered in Other Visits (Other)  Medication Route   0.9 %  sodium chloride infusion Intravenous   lactated ringers infusion Intravenous   REVIEW OF SYSTEMS: ROS   Positive for: Neurological, Genitourinary, Endocrine, Cardiovascular, Eyes Negative for: Constitutional, Gastrointestinal, Skin, Musculoskeletal, HENT, Respiratory, Psychiatric, Allergic/Imm, Heme/Lymph Last edited by Theodore Demark, COA on 10/09/2021  8:35 AM.    ALLERGIES Allergies  Allergen Reactions   Adhesive [Tape]     Tears skin, can tolerate paper tape    PAST MEDICAL HISTORY Past Medical History:  Diagnosis Date   Atrial fibrillation with RVR ([place])     Bigeminy 01/2020   CHF (congestive heart failure) ([place]) 10/20/2019   CKD (chronic kidney disease), stage IV (HCC)    Diabetes mellitus without complication ([place])    DOE (dyspnea on exertion)    walking upstairs or up hill resolves in one minute   Dysrhythmia    a-fib - on pradaxa   Fibroids    History of kidney stones    History of recent blood transfusion 02/26/2020   Hypertension    Iron deficiency anemia    Non-ischemic cardiomyopathy (HCC)    tachycardia induced   Obese    Peripheral edema    Premature ventricular contractions (PVCs) ([facility])    Sleep apnea    does not use cpap   Stroke ([place]) 01/5851   Umbilical hernia    Wears glasses    Past Surgical History:  Procedure Laterality Date   CHOLECYSTECTOMY     CYSTOSCOPY W/ URETERAL STENT PLACEMENT Bilateral 05/24/2020   Procedure: CYSTOSCOPY WITH RETROGRADE PYELOGRAM/URETERAL STENT PLACEMENT;  Surgeon: Robley Fries, MD;  Location: [facility];  Service: Urology;  Laterality: Bilateral;   CYSTOSCOPY/RETROGRADE/URETEROSCOPY Bilateral 04/03/2020   Procedure: CYSTOSCOPY/RETROGRADE/URETEROSCOPY;  Surgeon: Ardis Hughs, MD;  Location: [facility];  Service: Urology;  Laterality: Bilateral;   EYE SURGERY Left 08/2021   cataract removed   HYSTERECTOMY ABDOMINAL WITH SALPINGO-OOPHORECTOMY  07/21/2020   Procedure: HYSTERECTOMY ABDOMINAL WITH BILATERAL SALPINGO-OOPHORECTOMY;  Surgeon: Sanjuana Kava, MD;  Location: [facility];  Service: Gynecology;;   INJECTION OF SILICONE OIL  Left 10/08/2021   Procedure: INJECTION OF SILICONE OIL;  Surgeon: Bernarda Caffey, MD;  Location: [facility];  Service: Ophthalmology;  Laterality: Left;   IR FLUORO GUIDE CV LINE RIGHT  02/21/2020   IR US GUIDE VASC ACCESS RIGHT  02/21/2020   MEMBRANE PEEL Left 10/08/2021   Procedure: MEMBRANE PEEL;  Surgeon: Bernarda Caffey, MD;  Location: [facility];  Service: Ophthalmology;  Laterality: Left;   NEPHROLITHOTOMY Left 02/14/2020   Procedure: NEPHROLITHOTOMY PERCUTANEOUS/  SURGEON ACCESS/ LEFT PERCUTANEOUS NEPHROSTOMY TUBE PLACEMENT;  Surgeon: Ardis Hughs, MD;  Location: [facility];  Service: Urology;  Laterality: Left;   NEPHROLITHOTOMY Left 02/21/2020   Procedure: NEPHROLITHOTOMY PERCUTANEOUS SECOND LOOK;  Surgeon: Ardis Hughs, MD;  [facility];  Service: Urology;  Laterality: Left;   NEPHROLITHOTOMY Left 02/26/2020   Procedure: NEPHROLITHOTOMY PERCUTANEOUS;  Surgeon: Ceasar Mons, MD;  Location[facility];  Service: Urology;  Laterality: Left;  NEED 150 MIN   NEPHROLITHOTOMY Right 03/24/2020   Procedure: NEPHROLITHOTOMY PERCUTANEOUS WITH ACCESS LEFT STENT REMOVAL;  Surgeon: Ardis Hughs, MD;  [facility];  Service: Urology;  Laterality: Right;   PARS PLANA VITRECTOMY Left 10/08/2021   Procedure: PARS PLANA VITRECTOMY WITH 25 GAUGE;  Surgeon: Bernarda Caffey, MD;  Location: [facility];  Service: Ophthalmology;  Laterality: Left;   PHOTOCOAGULATION WITH LASER Left 10/08/2021   Procedure: PHOTOCOAGULATION WITH LASER;  Surgeon: Bernarda Caffey, MD;  Location: [facility];  Service: Ophthalmology;  Laterality: Left;   RIGHT HEART CATH N/A 11/09/2019   Procedure: RIGHT HEART CATH;  Surgeon: Jolaine Artist, MD;  Location: [facility];  Service: Cardiovascular;  Laterality: N/A;   WISDOM TOOTH EXTRACTION     FAMILY HISTORY Family History  Problem Relation Age of Onset   Atrial fibrillation Mother    Hypertension Mother    Stroke Mother 31   Diabetes Mother    Diabetes Father    Hypertension Father    Diabetes Sister    Hypertension Sister    Diabetes Brother    Hypertension Brother    Diabetes Brother    Heart attack Brother    Stroke Maternal Grandmother 60   SOCIAL HISTORY Social History   Tobacco Use   Smoking status: Never   Smokeless tobacco: Never  Vaping Use   Vaping Use: Never used  Substance Use Topics   Alcohol use: Never   Drug use: Never       OPHTHALMIC EXAM:  Base Eye Exam     Visual Acuity  (Snellen - Linear)       Right Left   Dist [place] 20/80 +2 LP   Dist ph [place] 20/30 -2 NI  Patient didn't bring glasses.        Tonometry (Tonopen, 8:33 AM)       Right Left   Pressure def 22         Pupils       Dark   Right    Left Dilated         Visual Fields       Left Right   Restrictions Total superior temporal, inferior temporal, superior nasal, inferior nasal deficiencies          Extraocular Movement       Right Left    Full Full         Neuro/Psych     Oriented x3: Yes   Mood/Affect: Normal         Dilation     Left  eye: 1.0% Mydriacyl, 2.5% Phenylephrine @ 8:32 AM           Slit Lamp and Fundus Exam     External Exam       Right Left   External Normal Normal         Slit Lamp Exam       Right Left   Lids/Lashes Dermatochalasis - upper lid Dermatochalasis - upper lid   Conjunctiva/Sclera Melanosis Melanosis, mild Subconjunctival hemorrhage, sutures intact   Cornea mild arcus, trace PEE mild arcus, well healed cataract wound, epi defect, mild descemet folds   Anterior Chamber Deep and clear Deep and quiet, mild early fibrin reaction, 3+ cell and pigment   Iris Round and dilated, No NVI Round and dilated, No NVI   Lens 1-2+ Nuclear sclerosis, 1-2+ Cortical cataract, fine pigment deposition on posterior capsule PC IOL in good position   Vitreous Vitreous syneresis, fine pigment v RBC post vitrectomy, good oil fill         Fundus Exam       Right Left   Disc Pink and Sharp, +fibrosis, +NVD - regressing, mild PPP Fibrosis improved, +heme   C/D Ratio 0.2    Macula Flat, Blunted foveal reflex, scattered MA, RPE mottling Flat under oil, fibrosis improved, scattered IRH/DBH   Vessels attenuated, Tortuous, peripheral sclerosis Severe attenuation / sclerosis, +fibrosis improved   Periphery Attached, scattered patches of fibrosis and NVD, small flap tear at 0130 equator--no SRF, old VH settled inferiorly; PRP laser changes Scattered  DBH; good early PRP changes 360            IMAGING AND PROCEDURES  Imaging and Procedures for 10/09/2021           ASSESSMENT/PLAN:    ICD-10-CM   1. Proliferative diabetic retinopathy of both eyes without macular edema associated with type 2 diabetes mellitus ([facility])  W09.8119     2. Vitreous hemorrhage of left eye (HCC)  H43.12     3. Traction detachment of left retina  H33.42     4. Retinal edema  H35.81     5. Retinal tear of right eye  H33.311     6. Essential hypertension  I10     7. Hypertensive retinopathy of both eyes  H35.033     8. Combined forms of age-related cataract of right eye  H25.811     9. Pseudophakia  Z96.1       1-4. Proliferative diabetic retinopathy OU  - s/p PRP OD (09.19.22)             - OD: w/ +NVD  - OS: with chronic, diffuse VH and underlying TRD - exam shows +NVD and scattered fibrosis OD, OS no view posteriorly due to dense VH - bscan OS 9.14.22 and 10.10.22 shows diffuse VH OS, hyperechoic membranes, +TRD             - POD1 s/p PPV/MP/EL/FAX/silicone oil (1478 cs) OS 10.20.22             - intra-op -- retina beneath chronic, dense VH was severely ischemic, florid fibrosis w/ +360 posterior TRD w/ stretch holes - doing well this morning             - good oil fill, fibrosis improved, and good laser changes in place             - IOP mildly elevated 22             - start  PF 6x/day OD                          zymaxid QID OD                          Atropine BID OD                          Brimonidine BID OD                          Cosopt BID OD                         PSO ung QID OD              - cont face down positioning x3 days; avoid laying flat on back              - eye shield when sleeping              - post op drop and positioning instructions reviewed              - tylenol/ibuprofen for pain              - F/u next Thursday, 10.26.22 for POV  5. Retinal tear, OD - The incidence, risk factors, and natural  history of retinal tear was discussed with patient.   - Potential treatment options including laser retinopexy and cryotherapy discussed with patient. - small flap tear located at 0130 equator, no SRF - lasered with PRP OD as above on 9.19.22  6,7. Hypertensive retinopathy OU - discussed importance of tight BP control - monitor  8. Mixed Cataract OD - The symptoms of cataract, surgical options, and treatments and risks were discussed with patient. - discussed diagnosis and progression - under the expert management of Dr. Wyatt Portela - discussed possible CEIOL OS prior to retina surgery  9. Pseudophakia OS  - s/p CE/IOL (Dr. Katy Fitch)  - IOL in good position, doing well  - monitor  Ophthalmic Meds Ordered this visit:  No orders of the defined types were placed in this encounter.    Return for F/u Thursday, 10.26.22 for POV.  There are no Patient Instructions on file for this visit.  This document serves as a record of services personally performed by Gardiner Sleeper, MD, PhD. It was created on their behalf by Estill Bakes, COT an ophthalmic technician. The creation of this record is the provider's dictation and/or activities during the visit.    Electronically signed by: Estill Bakes, COT 10.21.22 @ 10:50 PM   Gardiner Sleeper, M.D., Ph.D. Diseases & Surgery of the Retina and [place] 10.21.22  I have reviewed the above documentation for accuracy and completeness, and I agree with the above. Gardiner Sleeper, M.D., Ph.D. 10/09/21 10:50 PM   Abbreviations: M myopia (nearsighted); A astigmatism; H hyperopia (farsighted); P presbyopia; Mrx spectacle prescription;  CTL contact lenses; OD right eye; OS left eye; OU both eyes  XT exotropia; ET esotropia; PEK punctate epithelial keratitis; PEE punctate epithelial erosions; DES dry eye syndrome; MGD meibomian gland dysfunction; ATs artificial tears; PFAT's preservative free artificial tears; [place] nuclear  sclerotic cataract; PSC posterior subcapsular cataract; ERM epi-retinal membrane; PVD posterior vitreous detachment; RD retinal detachment; DM diabetes mellitus; DR  diabetic retinopathy; NPDR non-proliferative diabetic retinopathy; PDR proliferative diabetic retinopathy; CSME clinically significant macular edema; DME diabetic macular edema; dbh dot blot hemorrhages; CWS cotton wool spot; POAG primary open angle glaucoma; C/D cup-to-disc ratio; HVF humphrey visual field; GVF goldmann visual field; OCT optical coherence tomography; IOP intraocular pressure; BRVO Branch retinal vein occlusion; CRVO central retinal vein occlusion; CRAO central retinal artery occlusion; BRAO branch retinal artery occlusion; RT retinal tear; SB scleral buckle; PPV pars plana vitrectomy; VH Vitreous hemorrhage; PRP panretinal laser photocoagulation; IVK intravitreal kenalog; VMT vitreomacular traction; MH Macular hole;  NVD neovascularization of the disc; NVE neovascularization elsewhere; AREDS age related eye disease study; ARMD age related macular degeneration; POAG primary open angle glaucoma; EBMD epithelial/anterior basement membrane dystrophy; ACIOL anterior chamber intraocular lens; IOL intraocular lens; PCIOL posterior chamber intraocular lens; Phaco/IOL phacoemulsification with intraocular lens placement; [place] photorefractive keratectomy; LASIK laser assisted in situ keratomileusis; HTN hypertension; DM diabetes mellitus; COPD chronic obstructive pulmonary disease

## 2021-10-12 NOTE — Progress Notes (Signed)
[facility] Note  10/15/2021     CHIEF COMPLAINT Patient presents for Retina Follow Up   HISTORY OF PRESENT ILLNESS: Adrienne Lane is a 55 y.o. female who presents to the clinic today for:  HPI     Retina Follow Up   Patient presents with  Other.  In left eye.  This started 6 days ago.  I, the attending physician,  performed the HPI with the patient and updated documentation appropriately.        Comments   Patient here for 6 days retina follow up for RD OS s/p (10-08-21). Patient states still sees light and shadow like before. Has eye pain. Used drops this am at 8 am.       Last edited by Bernarda Caffey, MD on 10/17/2021  2:47 AM.    Pt states she is able to see more light and hand motion, she states she been in pain, but Tylenol helps  Referring physician: Debbra Riding, MD [address] [place],  [place] [address]  HISTORICAL INFORMATION:   Selected notes from the MEDICAL RECORD NUMBER Referred by Dr. Wyatt Portela for concern of VH   CURRENT MEDICATIONS: Current Outpatient Medications (Ophthalmic Drugs)  Medication Sig   prednisoLONE acetate (PRED FORTE) 1 % ophthalmic suspension Place 1 drop into the left eye daily.   No current facility-administered medications for this visit. (Ophthalmic Drugs)   Current Outpatient Medications (Other)  Medication Sig   acetaminophen (TYLENOL) 500 MG tablet Take 500 mg by mouth every 6 (six) hours as needed for moderate pain or headache.   amLODipine (NORVASC) 10 MG tablet Take 1 tablet (10 mg total) by mouth daily.   aspirin 81 MG chewable tablet Chew 1 tablet (81 mg total) by mouth daily.   atorvastatin (LIPITOR) 80 MG tablet take 1 tablet (80 mg) by oral route once daily at bedtime for 90 days   carvedilol (COREG) 6.25 MG tablet Take 1 tablet (6.25 mg total) by mouth 2 (two) times daily with a meal.   cholecalciferol (VITAMIN D3) 25 MCG (1000 UNIT) tablet Take 1,000 Units by mouth daily.    dabigatran (PRADAXA) 150 MG CAPS capsule TAKE 1 CAPSULE (150 MG TOTAL) BY MOUTH EVERY 12 HOURS.   dapagliflozin propanediol (FARXIGA) 10 MG TABS tablet Take 1 tablet (10 mg total) by mouth daily before breakfast.   glucose blood (TRUE METRIX BLOOD GLUCOSE TEST) test strip Use as instructed   hydrALAZINE (APRESOLINE) 100 MG tablet Take 1 tablet (100 mg total) by mouth 3 (three) times daily.   insulin aspart (NOVOLOG) 100 UNIT/ML injection Use as directed sliding scale insulin TID. (Patient taking differently: Inject 2-5 Units into the skin daily. Use as directed sliding scale insulin TID.)   insulin glargine (LANTUS) 100 UNIT/ML injection INJECT 0.22 MLS (22 UNITS TOTAL) INTO THE SKIN EVERY MORNING.   Insulin Syringe-Needle U-100 (TRUEPLUS INSULIN SYRINGE) 30G X 5/16" 0.3 ML MISC USE AS DIRECTED 3 TIMES DAILY.   Insulin Syringe-Needle U-100 30G X 5/16" 0.5 ML MISC Use as directed.   isosorbide mononitrate (IMDUR) 60 MG 24 hr tablet Take 1 tablet (60 mg total) by mouth daily.   Lancets (ONETOUCH ULTRASOFT) lancets Check CBG twice a day   mexiletine (MEXITIL) 150 MG capsule TAKE 2 CAPSULES (300 MG TOTAL) BY MOUTH EVERY 12 (TWELVE) HOURS.   Multiple Vitamins-Minerals (WOMENS MULTI PO) Take 1 tablet by mouth daily.   potassium chloride (KLOR-CON) 10 MEQ tablet Take  1 tablet (10 mEq total) by mouth daily.   sacubitril-valsartan (ENTRESTO) 24-26 MG Take 1 tablet by mouth 2 (two) times daily.   TRUEplus Lancets 28G MISC CHECK BLOOD GLUCOSE TWICE A DAY   No current facility-administered medications for this visit. (Other)   REVIEW OF SYSTEMS: ROS   Positive for: Neurological, Genitourinary, Endocrine, Cardiovascular, Eyes Negative for: Constitutional, Gastrointestinal, Skin, Musculoskeletal, HENT, Respiratory, Psychiatric, Allergic/Imm, Heme/Lymph Last edited by Theodore Demark, COA on 10/15/2021  9:01 AM.     ALLERGIES Allergies  Allergen Reactions   Adhesive [Tape]     Tears skin, can  tolerate paper tape    PAST MEDICAL HISTORY Past Medical History:  Diagnosis Date   Atrial fibrillation with RVR ([place])    Bigeminy 01/2020   CHF (congestive heart failure) ([place]) 10/20/2019   CKD (chronic kidney disease), stage IV (HCC)    Diabetes mellitus without complication ([place])    DOE (dyspnea on exertion)    walking upstairs or up hill resolves in one minute   Dysrhythmia    a-fib - on pradaxa   Fibroids    History of kidney stones    History of recent blood transfusion 02/26/2020   Hypertension    Iron deficiency anemia    Non-ischemic cardiomyopathy (HCC)    tachycardia induced   Obese    Peripheral edema    Premature ventricular contractions (PVCs) (VPCs)    Sleep apnea    does not use cpap   Stroke ([place]) 41/2878   Umbilical hernia    Wears glasses    Past Surgical History:  Procedure Laterality Date   CHOLECYSTECTOMY     CYSTOSCOPY W/ URETERAL STENT PLACEMENT Bilateral 05/24/2020   Procedure: CYSTOSCOPY WITH RETROGRADE PYELOGRAM/URETERAL STENT PLACEMENT;  Surgeon: Robley Fries, MD;  Location: [facility];  Service: Urology;  Laterality: Bilateral;   CYSTOSCOPY/RETROGRADE/URETEROSCOPY Bilateral 04/03/2020   Procedure: CYSTOSCOPY/RETROGRADE/URETEROSCOPY;  Surgeon: Ardis Hughs, MD;  Location: [facility];  Service: Urology;  Laterality: Bilateral;   EYE SURGERY Left 08/2021   cataract removed   HYSTERECTOMY ABDOMINAL WITH SALPINGO-OOPHORECTOMY  07/21/2020   Procedure: HYSTERECTOMY ABDOMINAL WITH BILATERAL SALPINGO-OOPHORECTOMY;  Surgeon: Sanjuana Kava, MD;  Location: [facility];  Service: Gynecology;;   INJECTION OF SILICONE OIL Left 67/67/2094   Procedure: INJECTION OF SILICONE OIL;  Surgeon: Bernarda Caffey, MD;  Location: [facility];  Service: Ophthalmology;  Laterality: Left;   IR FLUORO GUIDE CV LINE RIGHT  02/21/2020   IR US GUIDE VASC ACCESS RIGHT  02/21/2020   MEMBRANE PEEL Left 10/08/2021   Procedure: MEMBRANE PEEL;  Surgeon: Bernarda Caffey, MD;  Location: [facility];   Service: Ophthalmology;  Laterality: Left;   NEPHROLITHOTOMY Left 02/14/2020   Procedure: NEPHROLITHOTOMY PERCUTANEOUS/ SURGEON ACCESS/ LEFT PERCUTANEOUS NEPHROSTOMY TUBE PLACEMENT;  Surgeon: Ardis Hughs, MD;  Location: [facility];  Service: Urology;  Laterality: Left;   NEPHROLITHOTOMY Left 02/21/2020   Procedure: NEPHROLITHOTOMY PERCUTANEOUS SECOND LOOK;  Surgeon: Ardis Hughs, MD;  Location: [facility];  Service: Urology;  Laterality: Left;   NEPHROLITHOTOMY Left 02/26/2020   Procedure: NEPHROLITHOTOMY PERCUTANEOUS;  Surgeon: Ceasar Mons, MD;  Location: [facility];  Service: Urology;  Laterality: Left;  NEED 150 MIN   NEPHROLITHOTOMY Right 03/24/2020   Procedure: NEPHROLITHOTOMY PERCUTANEOUS WITH ACCESS LEFT STENT REMOVAL;  Surgeon: Ardis Hughs, MD;  Location: [facility];  Service: Urology;  Laterality: Right;   PARS PLANA VITRECTOMY Left 10/08/2021   Procedure: PARS PLANA VITRECTOMY WITH 25 GAUGE;  Surgeon: Bernarda Caffey, MD;  Location:  [facility];  Service: Ophthalmology;  Laterality: Left;   PHOTOCOAGULATION WITH LASER Left 10/08/2021   Procedure: PHOTOCOAGULATION WITH LASER;  Surgeon: Bernarda Caffey, MD;  Location: [facility];  Service: Ophthalmology;  Laterality: Left;   RIGHT HEART CATH N/A 11/09/2019   Procedure: RIGHT HEART CATH;  Surgeon: Jolaine Artist, MD;  Location: [facility];  Service: Cardiovascular;  Laterality: N/A;   WISDOM TOOTH EXTRACTION     FAMILY HISTORY Family History  Problem Relation Age of Onset   Atrial fibrillation Mother    Hypertension Mother    Stroke Mother 96   Diabetes Mother    Diabetes Father    Hypertension Father    Diabetes Sister    Hypertension Sister    Diabetes Brother    Hypertension Brother    Diabetes Brother    Heart attack Brother    Stroke Maternal Grandmother 25   SOCIAL HISTORY Social History   Tobacco Use   Smoking status: Never   Smokeless tobacco: Never  Vaping Use   Vaping Use: Never used   Substance Use Topics   Alcohol use: Never   Drug use: Never       OPHTHALMIC EXAM:  Base Eye Exam     Visual Acuity (Snellen - Linear)       Right Left   Dist [place] 20/100 -2 CF at 3'   Dist ph [place] 20/30 -1          Tonometry (Tonopen, 8:58 AM)       Right Left   Pressure def 11         Pupils       Dark   Right    Left dilatrd         Visual Fields       Left Right   Restrictions Total superior temporal, inferior temporal, superior nasal, inferior nasal deficiencies          Extraocular Movement       Right Left    Full Full         Neuro/Psych     Oriented x3: Yes   Mood/Affect: Normal         Dilation     Left eye: 1.0% Mydriacyl, 2.5% Phenylephrine @ 8:58 AM           Slit Lamp and Fundus Exam     External Exam       Right Left   External Normal Normal         Slit Lamp Exam       Right Left   Lids/Lashes Dermatochalasis - upper lid Dermatochalasis - upper lid   Conjunctiva/Sclera Melanosis 2+injection sutures intact   Cornea mild arcus, trace PEE mild arcus, well healed cataract wound, epi defect - closed, trace descemet folds   Anterior Chamber Deep and clear Deep and quiet, fibrin reaction - improved, 2+ cell and pigment   Iris Round and dilated, No NVI Round and dilated, No NVI   Lens 1-2+ Nuclear sclerosis, 1-2+ Cortical cataract, fine pigment deposition on posterior capsule PC IOL in good position   Vitreous Vitreous syneresis, fine pigment v RBC post vitrectomy, good oil fill         Fundus Exam       Right Left   Disc Pink and Sharp, +fibrosis, +NVD - regressing, mild PPP pink and sharp, Fibrosis improved, +heme - improving   C/D Ratio 0.2 0.2   Macula Flat, Blunted foveal reflex, scattered MA, RPE  mottling Flat under oil, fibrosis improved, scattered IRH/DBH   Vessels attenuated, Tortuous, peripheral sclerosis Severe attenuation / sclerosis, +fibrosis improved, tortuousity   Periphery Attached, scattered  patches of fibrosis and NVD, small flap tear at 0130 equator--no SRF, old VH settled inferiorly; PRP laser changes Scattered DBH; good early PRP changes 360           Refraction     Wearing Rx       Sphere Cylinder Axis   Right -2.50 +1.75 178   Left -2.50 +1.25 018            IMAGING AND PROCEDURES  Imaging and Procedures for 10/15/2021           ASSESSMENT/PLAN:    ICD-10-CM   1. 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 Vitreous hemorrhage of left eye (HCC)  H43.12     3. Traction detachment of left retina  H33.42     4. Retinal edema  H35.81     5. Retinal tear of right eye  H33.311     6. Essential hypertension  I10     7. Hypertensive retinopathy of both eyes  H35.033     8. Combined forms of age-related cataract of right eye  H25.811     9. Pseudophakia  Z96.1       1-4. Proliferative diabetic retinopathy OU  - s/p PRP OD (09.19.22)             - OD: w/ +NVD  - OS: with chronic, diffuse VH and underlying TRD - exam shows +NVD and scattered fibrosis OD, OS no view posteriorly due to dense VH - bscan OS 9.14.22 and 10.10.22 shows diffuse VH OS, hyperechoic membranes, +TRD             - POW1 s/p PPV/MP/EL/FAX/silicone oil (4034 cs) OS 10.20.22             - intra-op -- retina beneath chronic, dense VH was severely ischemic, florid fibrosis w/ +360 posterior TRD w/ stretch holes - doing well             - good oil fill, fibrosis improved, and good laser changes in place             - IOP good at 11             - cont   PF 6x/day OD                          zymaxid QID OD                          Atropine BID OD                          Brimonidine BID OD                          Cosopt BID OD -- okay to stop                         PSO ung QID OD              - cont face down positioning x3 days; avoid laying flat on back              -  eye shield when  sleeping x1 more week             - post op drop and positioning instructions reviewed              - tylenol/ibuprofen for pain              - F/u 2 weeks, DFE, OCT  5. Retinal tear, OD - The incidence, risk factors, and natural history of retinal tear was discussed with patient.   - Potential treatment options including laser retinopexy and cryotherapy discussed with patient. - small flap tear located at 0130 equator, no SRF - lasered with PRP OD as above on 9.19.22  6,7. Hypertensive retinopathy OU - discussed importance of tight BP control - monitor  8. Mixed Cataract OD - The symptoms of cataract, surgical options, and treatments and risks were discussed with patient. - discussed diagnosis and progression - under the expert management of Dr. Wyatt Portela - discussed possible CEIOL OS prior to retina surgery  9. Pseudophakia OS  - s/p CE/IOL (Dr. Katy Fitch)  - IOL in good position, doing well  - monitor  Ophthalmic Meds Ordered this visit:  No orders of the defined types were placed in this encounter.    Return in about 2 weeks (around 10/29/2021) for f/u PDR OU, DFE, OCT.  There are no Patient Instructions on file for this visit.  This document serves as a record of services personally performed by Gardiner Sleeper, MD, PhD. It was created on their behalf by San Jetty. Owens Shark, OA an ophthalmic technician. The creation of this record is the provider's dictation and/or activities during the visit.    Electronically signed by: San Jetty. [place], [place] 10.24.2022 2:50 AM   Gardiner Sleeper, M.D., Ph.D. Diseases & Surgery of the Retina and Vitreous [facility]  I have reviewed the above documentation for accuracy and completeness, and I agree with the above. Gardiner Sleeper, M.D., Ph.D. 10/17/21 2:50 AM   Abbreviations: M myopia (nearsighted); A astigmatism; H hyperopia (farsighted); P presbyopia; Mrx spectacle prescription;  CTL contact lenses; OD right eye;  OS left eye; OU both eyes  XT exotropia; ET esotropia; PEK punctate epithelial keratitis; PEE punctate epithelial erosions; DES dry eye syndrome; MGD meibomian gland dysfunction; ATs artificial tears; PFAT's preservative free artificial tears; [place] nuclear sclerotic cataract; PSC posterior subcapsular cataract; ERM epi-retinal membrane; PVD posterior vitreous detachment; RD retinal detachment; DM diabetes mellitus; DR diabetic retinopathy; NPDR non-proliferative diabetic retinopathy; PDR proliferative diabetic retinopathy; CSME clinically significant macular edema; DME diabetic macular edema; dbh dot blot hemorrhages; CWS cotton wool spot; POAG primary open angle glaucoma; C/D cup-to-disc ratio; HVF humphrey visual field; GVF goldmann visual field; OCT optical coherence tomography; IOP intraocular pressure; BRVO Branch retinal vein occlusion; CRVO central retinal vein occlusion; CRAO central retinal artery occlusion; BRAO branch retinal artery occlusion; RT retinal tear; SB scleral buckle; PPV pars plana vitrectomy; VH Vitreous hemorrhage; PRP panretinal laser photocoagulation; IVK intravitreal kenalog; VMT vitreomacular traction; MH Macular hole;  NVD neovascularization of the disc; NVE neovascularization elsewhere; AREDS age related eye disease study; ARMD age related macular degeneration; POAG primary open angle glaucoma; EBMD epithelial/anterior basement membrane dystrophy; ACIOL anterior chamber intraocular lens; IOL intraocular lens; PCIOL posterior chamber intraocular lens; Phaco/IOL phacoemulsification with intraocular lens placement; [place] photorefractive keratectomy; LASIK laser assisted in situ keratomileusis; HTN hypertension; DM diabetes mellitus; COPD chronic obstructive pulmonary disease

## 2021-10-12 NOTE — Telephone Encounter (Signed)
Advanced Heart Failure Patient Advocate Encounter   Patient was approved to receive Entresto from [employer]  Patient ID: [id number] Effective dates: 10/05/21 through 10/05/22  Called and spoke with the patient.   Charlann Boxer, CPhT

## 2021-10-15 ENCOUNTER — Ambulatory Visit (INDEPENDENT_AMBULATORY_CARE_PROVIDER_SITE_OTHER): Payer: MEDICAID | Admitting: Ophthalmology

## 2021-10-15 ENCOUNTER — Other Ambulatory Visit: Payer: Self-pay

## 2021-10-15 DIAGNOSIS — H33311 Horseshoe tear of retina without detachment, right eye: Secondary | ICD-10-CM

## 2021-10-15 DIAGNOSIS — H35033 Hypertensive retinopathy, bilateral: Secondary | ICD-10-CM

## 2021-10-15 DIAGNOSIS — I10 Essential (primary) hypertension: Secondary | ICD-10-CM

## 2021-10-15 DIAGNOSIS — E113593 Type 2 diabetes mellitus with proliferative diabetic retinopathy without macular edema, bilateral: Secondary | ICD-10-CM

## 2021-10-15 DIAGNOSIS — H3581 Retinal edema: Secondary | ICD-10-CM

## 2021-10-15 DIAGNOSIS — H3342 Traction detachment of retina, left eye: Secondary | ICD-10-CM

## 2021-10-15 DIAGNOSIS — H25811 Combined forms of age-related cataract, right eye: Secondary | ICD-10-CM

## 2021-10-15 DIAGNOSIS — Z961 Presence of intraocular lens: Secondary | ICD-10-CM

## 2021-10-15 DIAGNOSIS — H4312 Vitreous hemorrhage, left eye: Secondary | ICD-10-CM

## 2021-10-16 ENCOUNTER — Other Ambulatory Visit (HOSPITAL_COMMUNITY): Payer: Self-pay

## 2021-10-17 ENCOUNTER — Encounter (INDEPENDENT_AMBULATORY_CARE_PROVIDER_SITE_OTHER): Payer: Self-pay | Admitting: Ophthalmology

## 2021-10-21 ENCOUNTER — Other Ambulatory Visit (HOSPITAL_COMMUNITY): Payer: Self-pay

## 2021-10-23 NOTE — Progress Notes (Signed)
[facility] Note  10/30/2021     CHIEF COMPLAINT Patient presents for Retina Follow Up   HISTORY OF PRESENT ILLNESS: Adrienne Lane is a 55 y.o. female who presents to the clinic today for:  HPI     Retina Follow Up   Patient presents with  Diabetic Retinopathy.  In both eyes.  Severity is mild.  Duration of 2.  Since onset it is gradually improving.  I, the attending physician,  performed the HPI with the patient and updated documentation appropriately.        Comments   Pt here for 2 wk ret f/u for PDR OU. Pt states she is seeing improvement in OS, seeing colors and shapes better. She does report some pressure and minor discomfort, especially at night OS. Reports tylenol does help discomfort. Pt reports using gtts as follows:  PF 6x/day OS Atropine 1 gtts OS QD Brimonidine-BID OS Cosopt-discontinued  Zymaxid-discontinued PSO ung- 4x/day OS      Last edited by Bernarda Caffey, MD on 10/30/2021  1:07 PM.    Pt states vision is getting better, she can see colors better, the more light she has the better she can see    Referring physician: [facility] [place],  [place] [address]  HISTORICAL INFORMATION:   Selected notes from the MEDICAL RECORD NUMBER Referred by Dr. Wyatt Portela for concern of VH   CURRENT MEDICATIONS: Current Outpatient Medications (Ophthalmic Drugs)  Medication Sig   Bromfenac Sodium (PROLENSA) 0.07 % SOLN Place 1 drop into the left eye 4 (four) times daily.   prednisoLONE acetate (PRED FORTE) 1 % ophthalmic suspension Place 1 drop into the left eye 4 (four) times daily.   No current facility-administered medications for this visit. (Ophthalmic Drugs)   Current Outpatient Medications (Other)  Medication Sig   acetaminophen (TYLENOL) 500 MG tablet Take 500 mg by mouth every 6 (six) hours as needed for moderate pain or headache.   amLODipine (NORVASC) 10 MG tablet Take 1 tablet  (10 mg total) by mouth daily.   aspirin 81 MG chewable tablet Chew 1 tablet (81 mg total) by mouth daily.   atorvastatin (LIPITOR) 80 MG tablet take 1 tablet (80 mg) by oral route once daily at bedtime for 90 days   carvedilol (COREG) 6.25 MG tablet Take 1 tablet (6.25 mg total) by mouth 2 (two) times daily with a meal.   cholecalciferol (VITAMIN D3) 25 MCG (1000 UNIT) tablet Take 1,000 Units by mouth daily.   dabigatran (PRADAXA) 150 MG CAPS capsule TAKE 1 CAPSULE (150 MG TOTAL) BY MOUTH EVERY 12 HOURS.   dapagliflozin propanediol (FARXIGA) 10 MG TABS tablet Take 1 tablet (10 mg total) by mouth daily before breakfast.   hydrALAZINE (APRESOLINE) 100 MG tablet Take 1 tablet (100 mg total) by mouth 3 (three) times daily.   insulin aspart (NOVOLOG) 100 UNIT/ML injection Use as directed sliding scale insulin TID. (Patient taking differently: Inject 2-5 Units into the skin daily. Use as directed sliding scale insulin TID.)   insulin glargine (LANTUS) 100 UNIT/ML injection INJECT 0.22 MLS (22 UNITS TOTAL) INTO THE SKIN EVERY MORNING.   isosorbide mononitrate (IMDUR) 60 MG 24 hr tablet Take 1 tablet (60 mg total) by mouth daily.   mexiletine (MEXITIL) 150 MG capsule TAKE 2 CAPSULES (300 MG TOTAL) BY MOUTH EVERY 12 (TWELVE) HOURS.   potassium chloride (KLOR-CON) 10 MEQ tablet Take 1 tablet (10 mEq total) by mouth  daily.   sacubitril-valsartan (ENTRESTO) 24-26 MG Take 1 tablet by mouth 2 (two) times daily.   glucose blood (TRUE METRIX BLOOD GLUCOSE TEST) test strip Use as instructed   Insulin Syringe-Needle U-100 (TRUEPLUS INSULIN SYRINGE) 30G X 5/16" 0.3 ML MISC USE AS DIRECTED 3 TIMES DAILY.   Insulin Syringe-Needle U-100 30G X 5/16" 0.5 ML MISC Use as directed.   Lancets (ONETOUCH ULTRASOFT) lancets Check CBG twice a day   Multiple Vitamins-Minerals (WOMENS MULTI PO) Take 1 tablet by mouth daily.   TRUEplus Lancets 28G MISC CHECK BLOOD GLUCOSE TWICE A DAY   No current facility-administered medications  for this visit. (Other)   REVIEW OF SYSTEMS: ROS   Positive for: Neurological, Genitourinary, Endocrine, Cardiovascular, Eyes Negative for: Constitutional, Gastrointestinal, Skin, Musculoskeletal, HENT, Respiratory, Psychiatric, Allergic/Imm, Heme/Lymph Last edited by Kingsley Spittle, COT on 10/30/2021  8:58 AM.      ALLERGIES Allergies  Allergen Reactions   Adhesive [Tape]     Tears skin, can tolerate paper tape    PAST MEDICAL HISTORY Past Medical History:  Diagnosis Date   Atrial fibrillation with RVR ([place])    Bigeminy 01/2020   CHF (congestive heart failure) ([place]) 10/20/2019   CKD (chronic kidney disease), stage IV (HCC)    Diabetes mellitus without complication ([place])    Diabetic retinopathy ([place])    DOE (dyspnea on exertion)    walking upstairs or up hill resolves in one minute   Dysrhythmia    a-fib - on pradaxa   Fibroids    History of kidney stones    History of recent blood transfusion 02/26/2020   Hypertension    Iron deficiency anemia    Non-ischemic cardiomyopathy (HCC)    tachycardia induced   Obese    Peripheral edema    Premature ventricular contractions (PVCs) ([facility])    Sleep apnea    does not use cpap   Stroke ([place]) 26/2035   Umbilical hernia    Wears glasses    Past Surgical History:  Procedure Laterality Date   CHOLECYSTECTOMY     CYSTOSCOPY W/ URETERAL STENT PLACEMENT Bilateral 05/24/2020   Procedure: CYSTOSCOPY WITH RETROGRADE PYELOGRAM/URETERAL STENT PLACEMENT;  Surgeon: Robley Fries, MD;  Location: [facility];  Service: Urology;  Laterality: Bilateral;   CYSTOSCOPY/RETROGRADE/URETEROSCOPY Bilateral 04/03/2020   Procedure: CYSTOSCOPY/RETROGRADE/URETEROSCOPY;  Surgeon: Ardis Hughs, MD;  Location: [facility];  Service: Urology;  Laterality: Bilateral;   EYE SURGERY Left 08/2021   cataract removed   HYSTERECTOMY ABDOMINAL WITH SALPINGO-OOPHORECTOMY  07/21/2020   Procedure: HYSTERECTOMY ABDOMINAL WITH BILATERAL SALPINGO-OOPHORECTOMY;   Surgeon: Sanjuana Kava, MD;  Location: [facility];  Service: Gynecology;;   INJECTION OF SILICONE OIL Left 59/74/1638   Procedure: INJECTION OF SILICONE OIL;  Surgeon: Bernarda Caffey, MD;  Location: [facility];  Service: Ophthalmology;  Laterality: Left;   IR FLUORO GUIDE CV LINE RIGHT  02/21/2020   IR US GUIDE VASC ACCESS RIGHT  02/21/2020   MEMBRANE PEEL Left 10/08/2021   Procedure: MEMBRANE PEEL;  Surgeon: Bernarda Caffey, MD;  Location: [facility];  Service: Ophthalmology;  Laterality: Left;   NEPHROLITHOTOMY Left 02/14/2020   Procedure: NEPHROLITHOTOMY PERCUTANEOUS/ SURGEON ACCESS/ LEFT PERCUTANEOUS NEPHROSTOMY TUBE PLACEMENT;  Surgeon: Ardis Hughs, MD;  Location: [facility];  Service: Urology;  Laterality: Left;   NEPHROLITHOTOMY Left 02/21/2020   Procedure: NEPHROLITHOTOMY PERCUTANEOUS SECOND LOOK;  Surgeon: Ardis Hughs, MD;  Location: [facility];  Service: Urology;  Laterality: Left;   NEPHROLITHOTOMY Left 02/26/2020   Procedure: NEPHROLITHOTOMY PERCUTANEOUS;  Surgeon:  Ceasar Mons, MD;  Location: [facility];  Service: Urology;  Laterality: Left;  NEED 150 MIN   NEPHROLITHOTOMY Right 03/24/2020   Procedure: NEPHROLITHOTOMY PERCUTANEOUS WITH ACCESS LEFT STENT REMOVAL;  Surgeon: Ardis Hughs, MD;  Location: [facility];  Service: Urology;  Laterality: Right;   PARS PLANA VITRECTOMY Left 10/08/2021   Procedure: PARS PLANA VITRECTOMY WITH 25 GAUGE;  Surgeon: Bernarda Caffey, MD;  Location: [employer];  Service: Ophthalmology;  Laterality: Left;   PHOTOCOAGULATION WITH LASER Left 10/08/2021   Procedure: PHOTOCOAGULATION WITH LASER;  Surgeon: Bernarda Caffey, MD;  Location: [facility];  Service: Ophthalmology;  Laterality: Left;   RIGHT HEART CATH N/A 11/09/2019   Procedure: RIGHT HEART CATH;  Surgeon: Jolaine Artist, MD;  Location: [facility];  Service: Cardiovascular;  Laterality: N/A;   WISDOM TOOTH EXTRACTION     FAMILY HISTORY Family History  Problem Relation Age of Onset   Atrial  fibrillation Mother    Hypertension Mother    Stroke Mother 29   Diabetes Mother    Diabetes Father    Hypertension Father    Diabetes Sister    Hypertension Sister    Diabetes Brother    Hypertension Brother    Diabetes Brother    Heart attack Brother    Stroke Maternal Grandmother 24   SOCIAL HISTORY Social History   Tobacco Use   Smoking status: Never   Smokeless tobacco: Never  Vaping Use   Vaping Use: Never used  Substance Use Topics   Alcohol use: Never   Drug use: Never       OPHTHALMIC EXAM:  Base Eye Exam     Visual Acuity (Snellen - Linear)       Right Left   Dist [place] 20/80 -1 CF at 3'   Dist ph  20/30 NI         Tonometry (Tonopen, 9:09 AM)       Right Left   Pressure 8 12         Pupils       Dark Light Shape React APD   Right 3 2 Round Brisk None   Left 5 5 Round Minimal None         Visual Fields (Counting fingers)       Left Right     Full   Restrictions Total superior temporal, inferior temporal, inferior nasal deficiencies; Partial outer superior nasal deficiency          Extraocular Movement       Right Left    Full, Ortho Full, Ortho         Neuro/Psych     Oriented x3: Yes   Mood/Affect: Normal         Dilation     Both eyes: 1.0% Mydriacyl, 2.5% Phenylephrine @ 9:10 AM           Slit Lamp and Fundus Exam     External Exam       Right Left   External Normal Normal         Slit Lamp Exam       Right Left   Lids/Lashes Dermatochalasis - upper lid Dermatochalasis - upper lid   Conjunctiva/Sclera Melanosis sutures dissolving, Subconjunctival hemorrhage - resolved   Cornea mild arcus, trace PEE mild arcus, well healed cataract wound, epi defect - closed, 1+ Punctate epithelial erosions   Anterior Chamber Deep and clear Deep and quiet, 2+ cell and pigment   Iris Round and dilated,  No NVI Round and dilated, No NVI   Lens 1-2+ Nuclear sclerosis, 1-2+ Cortical cataract, fine pigment deposition on  posterior capsule PC IOL in good position   Vitreous Vitreous syneresis, fine pigment v RBC post vitrectomy, good oil fill         Fundus Exam       Right Left   Disc Pink and Sharp, +fibrosis, +NVD - regressing, mild PPP Mild pallor, sharp rim, Fibrosis improved, +heme - resolved   C/D Ratio 0.2 0.2   Macula Flat, Blunted foveal reflex, scattered MA, RPE mottling Flat under oil, fibrosis improved, scattered IRH/DBH, residual fibrosis and thickening nasal macula   Vessels attenuated, Tortuous, peripheral sclerosis Severe attenuation / sclerosis, +fibrosis improved, tortuousity   Periphery Attached, scattered patches of fibrosis and NVE, small flap tear at 54 equator--no SRF, old VH settled inferiorly - improving; PRP laser changes Scattered DBH; good PRP changes 360; scattered fibrosis            IMAGING AND PROCEDURES  Imaging and Procedures for 10/30/2021  OCT, Retina - OU - Both Eyes       Right Eye Quality was good. Central Foveal Thickness: 213. Progression has been stable. Findings include normal foveal contour, no IRF, no SRF, intraretinal hyper-reflective material (Trace cystic changes, partial PVD and disc fibrosis visible on widefield).   Left Eye Quality was good. Central Foveal Thickness: 483. Progression has improved. Findings include abnormal foveal contour, intraretinal fluid, no SRF, outer retinal atrophy, inner retinal atrophy, macular pucker.   Notes *Images captured and stored on drive  Diagnosis / Impression:  OD: Trace cystic changes, partial PVD and disc fibrosis visible on widefield -- no frank DME OS: retina attached under oil; diffuse atrophy; focal IRF and edema nasal macula   Clinical management:  See below  Abbreviations: NFP - Normal foveal profile. CME - cystoid macular edema. PED - pigment epithelial detachment. IRF - intraretinal fluid. SRF - subretinal fluid. EZ - ellipsoid zone. ERM - epiretinal membrane. ORA - outer retinal atrophy. ORT  - outer retinal tubulation. SRHM - subretinal hyper-reflective material. IRHM - intraretinal hyper-reflective material            ASSESSMENT/PLAN:    ICD-10-CM   1. 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 Vitreous hemorrhage of left eye (HCC)  H43.12     3. Traction detachment of left retina  H33.42     4. Retinal edema  H35.81 OCT, Retina - OU - Both Eyes    5. Retinal tear of right eye  H33.311     6. Essential hypertension  I10     7. Hypertensive retinopathy of both eyes  H35.033     8. Combined forms of age-related cataract of right eye  H25.811     9. Pseudophakia  Z96.1       1-4. Proliferative diabetic retinopathy OU  - s/p PRP OD (09.19.22)             - OD: w/ +NVD  - OS: with chronic, diffuse VH and underlying TRD - exam shows +NVD and scattered fibrosis OD, OS no view posteriorly due to dense VH - bscan OS 9.14.22 and 10.10.22 shows diffuse VH OS, hyperechoic membranes, +TRD             - POW3 s/p PPV/MP/EL/FAX/silicone oil (6378 cs) OS 10.20.22             -  intra-op -- retina beneath chronic, dense VH was severely ischemic, florid fibrosis w/ +360 posterior TRD w/ stretch holes - doing well             - good oil fill, retina attached under oil; fibrosis improved, and good laser changes in place             - IOP good at 12             - cont   PF 6x/day OS -- decrease to QID                         Atropine BID OS                          Brimonidine BID OS                          PSO ung QID OS  - add Prolensa QID OS             - avoid laying flat on back              - post op drop and positioning instructions reviewed              - F/u 3-4 weeks, DFE, OCT  5. Retinal tear, OD - The incidence, risk factors, and natural history of retinal tear was discussed with patient.   - Potential treatment options including laser retinopexy and cryotherapy  discussed with patient.  - small flap tear located at 0130 equator, no SRF - lasered with PRP OD as above on 9.19.22  6,7. Hypertensive retinopathy OU - discussed importance of tight BP control - monitor   8. Mixed Cataract OD - The symptoms of cataract, surgical options, and treatments and risks were discussed with patient. - discussed diagnosis and progression - under the expert management of Dr. Wyatt Portela - discussed possible CEIOL OS prior to retina surgery  9. Pseudophakia OS  - s/p CE/IOL (Dr. Katy Fitch)  - IOL in good position, doing well  - monitor   Ophthalmic Meds Ordered this visit:  Meds ordered this encounter  Medications   Bromfenac Sodium (PROLENSA) 0.07 % SOLN    Sig: Place 1 drop into the left eye 4 (four) times daily.    Dispense:  3 mL    Refill:  3   prednisoLONE acetate (PRED FORTE) 1 % ophthalmic suspension    Sig: Place 1 drop into the left eye 4 (four) times daily.    Dispense:  15 mL    Refill:  0     Return for f/u 3-4 weeks, PDR OU, DFE, OCT.  There are no Patient Instructions on file for this visit.  This document serves as a record of services personally performed by Gardiner Sleeper, MD, PhD. It was created on their behalf by Leonie Douglas, an ophthalmic technician. The creation of this record is the provider's dictation and/or activities during the visit.    Electronically signed by: Leonie Douglas COA, 10/30/21  1:09 PM  This document serves as a record of services personally performed by Gardiner Sleeper, MD, PhD. It was created on their behalf by San Jetty. Owens Shark, OA an ophthalmic technician. The creation of this record is the provider's dictation and/or activities during the visit.    Electronically signed by: San Jetty. [place], [place] 11.11.2022 1:09 PM  Aaron Edelman  Antony Haste, M.D., Ph.D. Diseases & Surgery of the Retina and [place] 10/30/2021  I have reviewed the above documentation for accuracy and completeness,  and I agree with the above. Gardiner Sleeper, M.D., Ph.D. 10/30/21 1:11 PM  Abbreviations: M myopia (nearsighted); A astigmatism; H hyperopia (farsighted); P presbyopia; Mrx spectacle prescription;  CTL contact lenses; OD right eye; OS left eye; OU both eyes  XT exotropia; ET esotropia; PEK punctate epithelial keratitis; PEE punctate epithelial erosions; DES dry eye syndrome; MGD meibomian gland dysfunction; ATs artificial tears; PFAT's preservative free artificial tears; [place] nuclear sclerotic cataract; PSC posterior subcapsular cataract; ERM epi-retinal membrane; PVD posterior vitreous detachment; RD retinal detachment; DM diabetes mellitus; DR diabetic retinopathy; NPDR non-proliferative diabetic retinopathy; PDR proliferative diabetic retinopathy; CSME clinically significant macular edema; DME diabetic macular edema; dbh dot blot hemorrhages; CWS cotton wool spot; POAG primary open angle glaucoma; C/D cup-to-disc ratio; HVF humphrey visual field; GVF goldmann visual field; OCT optical coherence tomography; IOP intraocular pressure; BRVO Branch retinal vein occlusion; CRVO central retinal vein occlusion; CRAO central retinal artery occlusion; BRAO branch retinal artery occlusion; RT retinal tear; SB scleral buckle; PPV pars plana vitrectomy; VH Vitreous hemorrhage; PRP panretinal laser photocoagulation; IVK intravitreal kenalog; VMT vitreomacular traction; MH Macular hole;  NVD neovascularization of the disc; NVE neovascularization elsewhere; AREDS age related eye disease study; ARMD age related macular degeneration; POAG primary open angle glaucoma; EBMD epithelial/anterior basement membrane dystrophy; ACIOL anterior chamber intraocular lens; IOL intraocular lens; PCIOL posterior chamber intraocular lens; Phaco/IOL phacoemulsification with intraocular lens placement; [place] photorefractive keratectomy; LASIK laser assisted in situ keratomileusis; HTN hypertension; DM diabetes mellitus; COPD chronic obstructive  pulmonary disease

## 2021-10-27 ENCOUNTER — Other Ambulatory Visit (HOSPITAL_COMMUNITY): Payer: Self-pay

## 2021-10-27 ENCOUNTER — Other Ambulatory Visit: Payer: Self-pay

## 2021-10-28 ENCOUNTER — Other Ambulatory Visit: Payer: Self-pay

## 2021-10-30 ENCOUNTER — Ambulatory Visit (INDEPENDENT_AMBULATORY_CARE_PROVIDER_SITE_OTHER): Payer: MEDICAID | Admitting: Ophthalmology

## 2021-10-30 ENCOUNTER — Other Ambulatory Visit: Payer: Self-pay

## 2021-10-30 ENCOUNTER — Encounter (INDEPENDENT_AMBULATORY_CARE_PROVIDER_SITE_OTHER): Payer: Self-pay | Admitting: Ophthalmology

## 2021-10-30 DIAGNOSIS — H33311 Horseshoe tear of retina without detachment, right eye: Secondary | ICD-10-CM

## 2021-10-30 DIAGNOSIS — E113593 Type 2 diabetes mellitus with proliferative diabetic retinopathy without macular edema, bilateral: Secondary | ICD-10-CM

## 2021-10-30 DIAGNOSIS — H4312 Vitreous hemorrhage, left eye: Secondary | ICD-10-CM

## 2021-10-30 DIAGNOSIS — H25811 Combined forms of age-related cataract, right eye: Secondary | ICD-10-CM

## 2021-10-30 DIAGNOSIS — H35033 Hypertensive retinopathy, bilateral: Secondary | ICD-10-CM

## 2021-10-30 DIAGNOSIS — I10 Essential (primary) hypertension: Secondary | ICD-10-CM

## 2021-10-30 DIAGNOSIS — Z961 Presence of intraocular lens: Secondary | ICD-10-CM

## 2021-10-30 DIAGNOSIS — H3581 Retinal edema: Secondary | ICD-10-CM

## 2021-10-30 DIAGNOSIS — H3342 Traction detachment of retina, left eye: Secondary | ICD-10-CM

## 2021-10-30 MED ORDER — PREDNISOLONE ACETATE 1 % OP SUSP: 1.0000 [drp] | Freq: Four times a day (QID) | OPHTHALMIC | 0 refills | Status: DC

## 2021-10-30 MED ORDER — PROLENSA 0.07 % OP SOLN: 1.0000 [drp] | Freq: Four times a day (QID) | OPHTHALMIC | 3 refills | Status: DC

## 2021-11-09 ENCOUNTER — Other Ambulatory Visit: Payer: Self-pay

## 2021-11-09 ENCOUNTER — Other Ambulatory Visit (INDEPENDENT_AMBULATORY_CARE_PROVIDER_SITE_OTHER): Payer: Self-pay

## 2021-11-09 ENCOUNTER — Other Ambulatory Visit (HOSPITAL_COMMUNITY): Payer: Self-pay

## 2021-11-09 ENCOUNTER — Telehealth (INDEPENDENT_AMBULATORY_CARE_PROVIDER_SITE_OTHER): Payer: Self-pay

## 2021-11-09 MED ORDER — KETOROLAC TROMETHAMINE 0.5 % OP SOLN: 1.0000 [drp] | Freq: Four times a day (QID) | OPHTHALMIC | 3 refills | Status: DC

## 2021-11-09 MED ORDER — KETOROLAC TROMETHAMINE 0.5 % OP SOLN
1.0000 [drp] | Freq: Four times a day (QID) | OPHTHALMIC | 3 refills | Status: DC
  Filled 2021-11-09: qty 5, 25d supply, fill #0

## 2021-11-09 NOTE — Telephone Encounter (Signed)
Per Dr. [name], sent in ketorolac 1gtt OS QID. Called and let pt know. Pt requested pharmacy be contacted about Prolensa not being filled. [place] and let them know. MS

## 2021-11-09 NOTE — Telephone Encounter (Signed)
Pt called stating she was not able to afford the Prolensa gtts even at the lowered $70 copay. Pt reports not having an income currently. She is asking for a cheaper alternative if available. MS

## 2021-11-17 ENCOUNTER — Other Ambulatory Visit: Payer: Self-pay

## 2021-11-17 ENCOUNTER — Other Ambulatory Visit (HOSPITAL_COMMUNITY): Payer: Self-pay

## 2021-11-19 ENCOUNTER — Other Ambulatory Visit (HOSPITAL_COMMUNITY): Payer: Self-pay

## 2021-11-19 ENCOUNTER — Other Ambulatory Visit: Payer: Self-pay

## 2021-11-19 ENCOUNTER — Telehealth (HOSPITAL_COMMUNITY): Payer: Self-pay | Admitting: Pharmacy Technician

## 2021-11-19 MED FILL — Dabigatran Etexilate Mesylate Cap 150 MG (Etexilate Base Eq): ORAL | 30 days supply | Qty: 60 | Fill #0 | Status: CN

## 2021-11-19 MED FILL — Dabigatran Etexilate Mesylate Cap 150 MG (Etexilate Base Eq): ORAL | 30 days supply | Qty: 60 | Fill #0 | Status: AC

## 2021-11-19 NOTE — Telephone Encounter (Signed)
Advanced Heart Failure Patient Advocate Encounter  Patient called in stating she had been without her Pradaxa for fours days. She lost some of the medication sent by the manufacturer. Tried to call [facility] a few times. Had to leave a message for a return call. Answering machine stated that return call would be made within 1 business day.  Called and spoke with the patient. She is aware that she needs to call [facility] as well and leave a message for a return call. She did have an active RX that had previously been sent in at our outpatient pharmacy. Called and spoke with [facility]. They were able to fill it this time for $10. Called and left the patient a message. Will continue to try and get in touch with [facility] to get a replacement refill.  Charlann Boxer, CPhT

## 2021-11-20 ENCOUNTER — Other Ambulatory Visit (HOSPITAL_COMMUNITY): Payer: Self-pay

## 2021-11-20 ENCOUNTER — Other Ambulatory Visit: Payer: Self-pay

## 2021-11-23 NOTE — Progress Notes (Signed)
[facility] Note  11/25/2021     CHIEF COMPLAINT Patient presents for Retina Follow Up   HISTORY OF PRESENT ILLNESS: Adrienne Lane is a 55 y.o. female who presents to the clinic today for:  HPI     Retina Follow Up   Patient presents with  Diabetic Retinopathy.  In both eyes.  This started months ago.  Severity is moderate.  Duration of 4 weeks.  Since onset it is stable.  I, the attending physician,  performed the HPI with the patient and updated documentation appropriately.        Comments   55 y/o female pt here for 4 wk f/u for PDR OU.  S/p PPV/MP OS 10.20.22.  No change in [place] OU.  Has occasional mild pain OS, especially around the orbit at night; has been going on ever since the sx.  Denies pain, FOL, floaters.  PF and Prolensa QID OS.  Brimonidine BID OS.  BS 152 yesterday.  Last A1C 8.0.      Last edited by Bernarda Caffey, MD on 11/25/2021  9:52 AM.    Pt states vision is stable, blood sugar and blood sugar have been under control    Referring physician: [facility] [place],  [place] [address]  HISTORICAL INFORMATION:   Selected notes from the MEDICAL RECORD NUMBER Referred by Dr. Wyatt Portela for concern of VH   CURRENT MEDICATIONS: Current Outpatient Medications (Ophthalmic Drugs)  Medication Sig   Bromfenac Sodium (PROLENSA) 0.07 % SOLN Place 1 drop into the left eye 4 (four) times daily.   ketorolac (ACULAR) 0.5 % ophthalmic solution Place 1 drop into the left eye 4 (four) times daily.   prednisoLONE acetate (PRED FORTE) 1 % ophthalmic suspension Place 1 drop into the left eye 4 (four) times daily.   No current facility-administered medications for this visit. (Ophthalmic Drugs)   Current Outpatient Medications (Other)  Medication Sig   acetaminophen (TYLENOL) 500 MG tablet Take 500 mg by mouth every 6 (six) hours as needed for moderate pain or headache.   amLODipine (NORVASC) 10 MG  tablet Take 1 tablet (10 mg total) by mouth daily.   aspirin 81 MG chewable tablet Chew 1 tablet (81 mg total) by mouth daily.   atorvastatin (LIPITOR) 80 MG tablet take 1 tablet (80 mg) by oral route once daily at bedtime for 90 days   carvedilol (COREG) 6.25 MG tablet Take 1 tablet (6.25 mg total) by mouth 2 (two) times daily with a meal.   cholecalciferol (VITAMIN D3) 25 MCG (1000 UNIT) tablet Take 1,000 Units by mouth daily.   dabigatran (PRADAXA) 150 MG CAPS capsule TAKE 1 CAPSULE (150 MG TOTAL) BY MOUTH EVERY 12 HOURS.   dapagliflozin propanediol (FARXIGA) 10 MG TABS tablet Take 1 tablet (10 mg total) by mouth daily before breakfast.   glucose blood (TRUE METRIX BLOOD GLUCOSE TEST) test strip Use as instructed   hydrALAZINE (APRESOLINE) 100 MG tablet Take 1 tablet (100 mg total) by mouth 3 (three) times daily.   insulin aspart (NOVOLOG) 100 UNIT/ML injection Use as directed sliding scale insulin TID. (Patient taking differently: Inject 2-5 Units into the skin daily. Use as directed sliding scale insulin TID.)   insulin glargine (LANTUS) 100 UNIT/ML injection INJECT 0.22 MLS (22 UNITS TOTAL) INTO THE SKIN EVERY MORNING.   Insulin Syringe-Needle U-100 (TRUEPLUS INSULIN SYRINGE) 30G X 5/16" 0.3 ML MISC USE AS DIRECTED 3 TIMES DAILY.  Insulin Syringe-Needle U-100 30G X 5/16" 0.5 ML MISC Use as directed.   isosorbide mononitrate (IMDUR) 60 MG 24 hr tablet Take 1 tablet (60 mg total) by mouth daily.   Lancets (ONETOUCH ULTRASOFT) lancets Check CBG twice a day   mexiletine (MEXITIL) 150 MG capsule TAKE 2 CAPSULES (300 MG TOTAL) BY MOUTH EVERY 12 (TWELVE) HOURS.   Multiple Vitamins-Minerals (WOMENS MULTI PO) Take 1 tablet by mouth daily.   potassium chloride (KLOR-CON) 10 MEQ tablet Take 1 tablet (10 mEq total) by mouth daily.   sacubitril-valsartan (ENTRESTO) 24-26 MG Take 1 tablet by mouth 2 (two) times daily.   TRUEplus Lancets 28G MISC CHECK BLOOD GLUCOSE TWICE A DAY   No current  facility-administered medications for this visit. (Other)   REVIEW OF SYSTEMS: ROS   Positive for: Endocrine, Cardiovascular, Eyes Negative for: Constitutional, Gastrointestinal, Neurological, Skin, Genitourinary, Musculoskeletal, HENT, Respiratory, Psychiatric, Allergic/Imm, Heme/Lymph Last edited by Matthew Folks, COA on 11/25/2021  9:05 AM.     ALLERGIES Allergies  Allergen Reactions   Adhesive [Tape]     Tears skin, can tolerate paper tape   PAST MEDICAL HISTORY Past Medical History:  Diagnosis Date   Atrial fibrillation with RVR ([place])    Bigeminy 01/2020   CHF (congestive heart failure) ([place]) 10/20/2019   CKD (chronic kidney disease), stage IV (HCC)    Diabetes mellitus without complication ([place])    Diabetic retinopathy ([place])    DOE (dyspnea on exertion)    walking upstairs or up hill resolves in one minute   Dysrhythmia    a-fib - on pradaxa   Fibroids    History of kidney stones    History of recent blood transfusion 02/26/2020   Hypertension    Iron deficiency anemia    Non-ischemic cardiomyopathy ([place])    tachycardia induced   Obese    Peripheral edema    Premature ventricular contractions (PVCs) ([facility])    Sleep apnea    does not use cpap   Stroke ([place]) 00/8676   Umbilical hernia    Wears glasses    Past Surgical History:  Procedure Laterality Date   CHOLECYSTECTOMY     CYSTOSCOPY W/ URETERAL STENT PLACEMENT Bilateral 05/24/2020   Procedure: CYSTOSCOPY WITH RETROGRADE PYELOGRAM/URETERAL STENT PLACEMENT;  Surgeon: Robley Fries, MD;  Location: [facility];  Service: Urology;  Laterality: Bilateral;   CYSTOSCOPY/RETROGRADE/URETEROSCOPY Bilateral 04/03/2020   Procedure: CYSTOSCOPY/RETROGRADE/URETEROSCOPY;  Surgeon: Ardis Hughs, MD;  Location: [facility];  Service: Urology;  Laterality: Bilateral;   EYE SURGERY Left 08/2021   cataract removed   HYSTERECTOMY ABDOMINAL WITH SALPINGO-OOPHORECTOMY  07/21/2020   Procedure: HYSTERECTOMY ABDOMINAL WITH  BILATERAL SALPINGO-OOPHORECTOMY;  Surgeon: Sanjuana Kava, MD;  Location: [facility];  Service: Gynecology;;   INJECTION OF SILICONE OIL Left 19/50/9326   Procedure: INJECTION OF SILICONE OIL;  Surgeon: Bernarda Caffey, MD;  Location: [facility];  Service: Ophthalmology;  Laterality: Left;   IR FLUORO GUIDE CV LINE RIGHT  02/21/2020   IR US GUIDE VASC ACCESS RIGHT  02/21/2020   MEMBRANE PEEL Left 10/08/2021   Procedure: MEMBRANE PEEL;  Surgeon: Bernarda Caffey, MD;  Location: [facility];  Service: Ophthalmology;  Laterality: Left;   NEPHROLITHOTOMY Left 02/14/2020   Procedure: NEPHROLITHOTOMY PERCUTANEOUS/ SURGEON ACCESS/ LEFT PERCUTANEOUS NEPHROSTOMY TUBE PLACEMENT;  Surgeon: Ardis Hughs, MD;  [facility]: [facility];  Service: Urology;  Laterality: Left;   NEPHROLITHOTOMY Left 02/21/2020   Procedure: NEPHROLITHOTOMY PERCUTANEOUS SECOND LOOK;  Surgeon: Ardis Hughs, MD;  [facility];  Service:  Urology;  Laterality: Left;   NEPHROLITHOTOMY Left 02/26/2020   Procedure: NEPHROLITHOTOMY PERCUTANEOUS;  Surgeon: Ceasar Mons, MD;  Location[facility];  Service: Urology;  Laterality: Left;  NEED 150 MIN   NEPHROLITHOTOMY Right 03/24/2020   Procedure: NEPHROLITHOTOMY PERCUTANEOUS WITH ACCESS LEFT STENT REMOVAL;  Surgeon: Ardis Hughs, MD;  [facility];  Service: Urology;  Laterality: Right;   PARS PLANA VITRECTOMY Left 10/08/2021   Procedure: PARS PLANA VITRECTOMY WITH 25 GAUGE;  Surgeon: Bernarda Caffey, MD;  Location: [facility];  Service: Ophthalmology;  Laterality: Left;   PHOTOCOAGULATION WITH LASER Left 10/08/2021   Procedure: PHOTOCOAGULATION WITH LASER;  Surgeon: Bernarda Caffey, MD;  Location: [facility];  Service: Ophthalmology;  Laterality: Left;   RIGHT HEART CATH N/A 11/09/2019   Procedure: RIGHT HEART CATH;  Surgeon: Jolaine Artist, MD;  Location: [facility];  Service: Cardiovascular;  Laterality: N/A;   WISDOM TOOTH EXTRACTION     FAMILY HISTORY Family History  Problem  Relation Age of Onset   Atrial fibrillation Mother    Hypertension Mother    Stroke Mother 47   Diabetes Mother    Diabetes Father    Hypertension Father    Diabetes Sister    Hypertension Sister    Diabetes Brother    Hypertension Brother    Diabetes Brother    Heart attack Brother    Stroke Maternal Grandmother 69   SOCIAL HISTORY Social History   Tobacco Use   Smoking status: Never   Smokeless tobacco: Never  Vaping Use   Vaping Use: Never used  Substance Use Topics   Alcohol use: Never   Drug use: Never       OPHTHALMIC EXAM:  Base Eye Exam     Visual Acuity (Snellen - Linear)       Right Left   Dist [place] 20/50 -2 CF @ 2'   Dist ph [place] 20/25 -2 NI         Tonometry (Tonopen, 9:13 AM)       Right Left   Pressure 10 12         Pupils       Dark Light Shape React APD   Right 3 2 Round Brisk None   Left 4 4 Round Minimal None         Visual Fields (Counting fingers)       Left Right     Full   Restrictions Total superior temporal, superior nasal, inferior nasal deficiencies; Partial outer inferior temporal deficiency          Extraocular Movement       Right Left    Full Full         Neuro/Psych     Oriented x3: Yes   Mood/Affect: Normal         Dilation     Both eyes: 1.0% Mydriacyl, 2.5% Phenylephrine @ 9:13 AM           Slit Lamp and Fundus Exam     External Exam       Right Left   External Normal Normal         Slit Lamp Exam       Right Left   Lids/Lashes Dermatochalasis - upper lid Dermatochalasis - upper lid   Conjunctiva/Sclera Melanosis sutures dissolved, mild melanosis   Cornea mild arcus, trace PEE mild arcus, well healed cataract wound, epi defect - closed, 1+ Punctate epithelial erosions   Anterior Chamber Deep and clear  Deep and quiet, 1-2+ cell and pigment   Iris Round and dilated, No NVI Round and dilated, No NVI   Lens 1-2+ Nuclear sclerosis, 1-2+ Cortical cataract, fine pigment deposition on  posterior capsule PC IOL in good position, pigment deposition on optic, 1-2+PCO   Anterior Vitreous Vitreous syneresis, fine pigment v RBC post vitrectomy, good oil fill, +pigment         Fundus Exam       Right Left   Disc Pink and Sharp, +fibrosis, +NVD - regressing, mild PPP 1+pallor, sharp nasal rim, +temporal edema   C/D Ratio 0.2 0.2   Macula Flat, Blunted foveal reflex, scattered MA, RPE mottling Flat under oil, fibrosis improved, scattered IRH/DBH, residual fibrosis and thickening nasal macula -- slightly increased   Vessels attenuated, Tortuous, peripheral sclerosis Severe attenuation / sclerosis, +fibrosis improved, tortuousity   Periphery Attached, scattered patches of fibrosis and NVE, small flap tear at 80 equator--no SRF, old VH settled inferiorly - improving; PRP laser changes Attached, Scattered DBH; good PRP changes 360; scattered fibrosis            IMAGING AND PROCEDURES  Imaging and Procedures for 11/25/2021  OCT, Retina - OU - Both Eyes       Right Eye Quality was good. Central Foveal Thickness: 215. Progression has been stable. Findings include normal foveal contour, no IRF, no SRF, intraretinal hyper-reflective material (Trace cystic changes, partial PVD and disc fibrosis visible on widefield, trace vitreous opacities).   Left Eye Quality was good. Central Foveal Thickness: 525. Progression has worsened. Findings include abnormal foveal contour, intraretinal fluid, no SRF, outer retinal atrophy, inner retinal atrophy, macular pucker (retina attached under oil; diffuse atrophy; focal IRF and edema nasal macula -- slightly increased).   Notes *Images captured and stored on drive  Diagnosis / Impression:  OD: Trace cystic changes, partial PVD and disc fibrosis visible on widefield -- no frank DME OS: retina attached under oil; diffuse atrophy; focal IRF and edema nasal macula -- slightly increased  Clinical management:  See below  Abbreviations: NFP -  Normal foveal profile. CME - cystoid macular edema. PED - pigment epithelial detachment. IRF - intraretinal fluid. SRF - subretinal fluid. EZ - ellipsoid zone. ERM - epiretinal membrane. ORA - outer retinal atrophy. ORT - outer retinal tubulation. SRHM - subretinal hyper-reflective material. IRHM - intraretinal hyper-reflective material            ASSESSMENT/PLAN:    ICD-10-CM   1. 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 Retinal edema  H35.81 OCT, Retina - OU - Both Eyes    3. Vitreous hemorrhage of left eye (HCC)  H43.12     4. Traction detachment of left retina  H33.42     5. Retinal tear of right eye  H33.311     6. Essential hypertension  I10     7. Hypertensive retinopathy of both eyes  H35.033     8. Combined forms of age-related cataract of right eye  H25.811     9. Pseudophakia  Z96.1       1-4. Proliferative diabetic retinopathy OU  - s/p PRP OD (09.19.22)             - OD: w/ +NVD  - OS: with chronic, diffuse VH and underlying TRD - exam shows +NVD and scattered fibrosis OD, OS no view posteriorly due to dense VH - bscan OS  9.14.22 and 10.10.22 shows diffuse VH OS, hyperechoic membranes, +TRD             - POW7 s/p PPV/MP/EL/FAX/silicone oil (1610 cs) OS 10.20.22             - intra-op -- retina beneath chronic, dense VH was severely ischemic, florid fibrosis w/ +360 posterior TRD w/ stretch holes             - good oil fill, retina attached under oil; fibrosis improved, and good laser changes in place  - BCVA remains CF OS             - IOP good at 12  - OCT shows interval increase in nasal IRF/edema OS -- ?worse DME vs CME             - cont PF QID OS    Prolensa QID OS -- pt is using ketorolac instead -- will give samples of Prolensa today PSO ung QID OS  - stop  Brimonidine             - avoid laying flat on back              - post op drop and positioning  instructions reviewed              - F/u 4 weeks, DFE, OCT possible injection  5. Retinal tear, OD - The incidence, risk factors, and natural history of retinal tear was discussed with patient.   - Potential treatment options including laser retinopexy and cryotherapy discussed with patient.  - small flap tear located at 0130 equator, no SRF - lasered with PRP OD as above on 9.19.22  6,7. Hypertensive retinopathy OU - discussed importance of tight BP control - monitor   8. Mixed Cataract OD - The symptoms of cataract, surgical options, and treatments and risks were discussed with patient. - discussed diagnosis and progression - under the expert management of Dr. Wyatt Portela - discussed possible CEIOL OS prior to retina surgery  9. Pseudophakia OS  - s/p CE/IOL (Dr. Katy Fitch)  - IOL in good position, doing well  - monitor   Ophthalmic Meds Ordered this visit:  No orders of the defined types were placed in this encounter.    Return in about 4 weeks (around 12/23/2021) for f/u PDR OU, DFE, OCT.  There are no Patient Instructions on file for this visit.  This document serves as a record of services personally performed by Gardiner Sleeper, MD, PhD. It was created on their behalf by Orvan Falconer, an ophthalmic technician. The creation of this record is the provider's dictation and/or activities during the visit.    Electronically signed by: Orvan Falconer, OA, 11/25/21  9:59 AM  This document serves as a record of services personally performed by Gardiner Sleeper, MD, PhD. It was created on their behalf by San Jetty. Owens Shark, OA an ophthalmic technician. The creation of this record is the provider's dictation and/or activities during the visit.    Electronically signed by: San Jetty. Owens Shark, [place] 12.07.2022 9:59 AM  Gardiner Sleeper, M.D., Ph.D. Diseases & Surgery of the Retina and Vitreous [facility]  I have reviewed the above documentation for accuracy and  completeness, and I agree with the above. Gardiner Sleeper, M.D., Ph.D. 11/25/21 9:59 AM  Abbreviations: M myopia (nearsighted); A astigmatism; H hyperopia (farsighted); P presbyopia; Mrx spectacle prescription;  CTL contact lenses; OD right eye; OS left eye; OU  both eyes  XT exotropia; ET esotropia; PEK punctate epithelial keratitis; PEE punctate epithelial erosions; DES dry eye syndrome; MGD meibomian gland dysfunction; ATs artificial tears; PFAT's preservative free artificial tears; [place] nuclear sclerotic cataract; PSC posterior subcapsular cataract; ERM epi-retinal membrane; PVD posterior vitreous detachment; RD retinal detachment; DM diabetes mellitus; DR diabetic retinopathy; NPDR non-proliferative diabetic retinopathy; PDR proliferative diabetic retinopathy; CSME clinically significant macular edema; DME diabetic macular edema; dbh dot blot hemorrhages; CWS cotton wool spot; POAG primary open angle glaucoma; C/D cup-to-disc ratio; HVF humphrey visual field; GVF goldmann visual field; OCT optical coherence tomography; IOP intraocular pressure; BRVO Branch retinal vein occlusion; CRVO central retinal vein occlusion; CRAO central retinal artery occlusion; BRAO branch retinal artery occlusion; RT retinal tear; SB scleral buckle; PPV pars plana vitrectomy; VH Vitreous hemorrhage; PRP panretinal laser photocoagulation; IVK intravitreal kenalog; VMT vitreomacular traction; MH Macular hole;  NVD neovascularization of the disc; NVE neovascularization elsewhere; AREDS age related eye disease study; ARMD age related macular degeneration; POAG primary open angle glaucoma; EBMD epithelial/anterior basement membrane dystrophy; ACIOL anterior chamber intraocular lens; IOL intraocular lens; PCIOL posterior chamber intraocular lens; Phaco/IOL phacoemulsification with intraocular lens placement; [place] photorefractive keratectomy; LASIK laser assisted in situ keratomileusis; HTN hypertension; DM diabetes mellitus; COPD chronic  obstructive pulmonary disease

## 2021-11-25 ENCOUNTER — Other Ambulatory Visit: Payer: Self-pay

## 2021-11-25 ENCOUNTER — Ambulatory Visit (INDEPENDENT_AMBULATORY_CARE_PROVIDER_SITE_OTHER): Admitting: Ophthalmology

## 2021-11-25 ENCOUNTER — Encounter (INDEPENDENT_AMBULATORY_CARE_PROVIDER_SITE_OTHER): Payer: Self-pay | Admitting: Ophthalmology

## 2021-11-25 DIAGNOSIS — E113593 Type 2 diabetes mellitus with proliferative diabetic retinopathy without macular edema, bilateral: Secondary | ICD-10-CM

## 2021-11-25 DIAGNOSIS — H3342 Traction detachment of retina, left eye: Secondary | ICD-10-CM

## 2021-11-25 DIAGNOSIS — H33311 Horseshoe tear of retina without detachment, right eye: Secondary | ICD-10-CM

## 2021-11-25 DIAGNOSIS — H25811 Combined forms of age-related cataract, right eye: Secondary | ICD-10-CM

## 2021-11-25 DIAGNOSIS — H35033 Hypertensive retinopathy, bilateral: Secondary | ICD-10-CM

## 2021-11-25 DIAGNOSIS — Z961 Presence of intraocular lens: Secondary | ICD-10-CM

## 2021-11-25 DIAGNOSIS — H4312 Vitreous hemorrhage, left eye: Secondary | ICD-10-CM

## 2021-11-25 DIAGNOSIS — I10 Essential (primary) hypertension: Secondary | ICD-10-CM

## 2021-11-25 DIAGNOSIS — H3581 Retinal edema: Secondary | ICD-10-CM

## 2021-11-27 ENCOUNTER — Encounter (INDEPENDENT_AMBULATORY_CARE_PROVIDER_SITE_OTHER): Payer: MEDICAID | Admitting: Ophthalmology

## 2021-12-07 ENCOUNTER — Other Ambulatory Visit: Payer: Self-pay | Admitting: Internal Medicine

## 2021-12-07 ENCOUNTER — Other Ambulatory Visit: Payer: Self-pay

## 2021-12-07 DIAGNOSIS — Z794 Long term (current) use of insulin: Secondary | ICD-10-CM

## 2021-12-08 ENCOUNTER — Other Ambulatory Visit: Payer: Self-pay

## 2021-12-10 NOTE — Progress Notes (Signed)
[facility] Note  12/11/2021     CHIEF COMPLAINT Patient presents for Retina Follow Up    HISTORY OF PRESENT ILLNESS: Adrienne Lane is a 55 y.o. female who presents to the clinic today for:  HPI     Retina Follow Up   Patient presents with  Diabetic Retinopathy.  In both eyes.  This started 3 days ago.  Severity is moderate.  Duration of 3 days.  Since onset it is gradually improving.  I, the attending physician,  performed the HPI with the patient and updated documentation appropriately.        Comments   Pt presents with fogginess and floaters in OD, started on Tuesday. She states it was worse when it started than it is now. She reports floaters looking like strings or vessels. She does report similar symptoms appearing the day before she had a stroke in Jan of this year. No changes reported OS. No flashes of light. Last BS she reported was Monday at 132.       Last edited by Bernarda Caffey, MD on 12/11/2021 12:54 PM.     Pt states she noticed new floaters in her right eye on Tuesday, she states sine then it has improved "slightly", she states left eye feels better today    Referring physician: [facility], [facility] [place],  [place] [address]  HISTORICAL INFORMATION:   Selected notes from the MEDICAL RECORD NUMBER Referred by Dr. Wyatt Portela for concern of VH   CURRENT MEDICATIONS: Current Outpatient Medications (Ophthalmic Drugs)  Medication Sig   Bromfenac Sodium (PROLENSA) 0.07 % SOLN Place 1 drop into the left eye 4 (four) times daily.   ketorolac (ACULAR) 0.5 % ophthalmic solution Place 1 drop into the left eye 4 (four) times daily.   prednisoLONE acetate (PRED FORTE) 1 % ophthalmic suspension Place 1 drop into the left eye 4 (four) times daily.   No current facility-administered medications for this visit. (Ophthalmic Drugs)   Current Outpatient Medications (Other)  Medication Sig    acetaminophen (TYLENOL) 500 MG tablet Take 500 mg by mouth every 6 (six) hours as needed for moderate pain or headache.   amLODipine (NORVASC) 10 MG tablet Take 1 tablet (10 mg total) by mouth daily.   aspirin 81 MG chewable tablet Chew 1 tablet (81 mg total) by mouth daily.   atorvastatin (LIPITOR) 80 MG tablet take 1 tablet (80 mg) by oral route once daily at bedtime for 90 days   carvedilol (COREG) 6.25 MG tablet Take 1 tablet (6.25 mg total) by mouth 2 (two) times daily with a meal.   cholecalciferol (VITAMIN D3) 25 MCG (1000 UNIT) tablet Take 1,000 Units by mouth daily.   dabigatran (PRADAXA) 150 MG CAPS capsule TAKE 1 CAPSULE (150 MG TOTAL) BY MOUTH EVERY 12 HOURS.   dapagliflozin propanediol (FARXIGA) 10 MG TABS tablet Take 1 tablet (10 mg total) by mouth daily before breakfast.   glucose blood (TRUE METRIX BLOOD GLUCOSE TEST) test strip Use as instructed   hydrALAZINE (APRESOLINE) 100 MG tablet Take 1 tablet (100 mg total) by mouth 3 (three) times daily.   insulin aspart (NOVOLOG) 100 UNIT/ML injection Use as directed sliding scale insulin TID. (Patient taking differently: Inject 2-5 Units into the skin daily. Use as directed sliding scale insulin TID.)   insulin glargine (LANTUS) 100 UNIT/ML injection INJECT 0.22 MLS (22 UNITS TOTAL) INTO THE SKIN EVERY MORNING.   Insulin Syringe-Needle U-100 (  TRUEPLUS INSULIN SYRINGE) 30G X 5/16" 0.3 ML MISC USE AS DIRECTED 3 TIMES DAILY.   Insulin Syringe-Needle U-100 30G X 5/16" 0.5 ML MISC Use as directed.   isosorbide mononitrate (IMDUR) 60 MG 24 hr tablet Take 1 tablet (60 mg total) by mouth daily.   Lancets (ONETOUCH ULTRASOFT) lancets Check CBG twice a day   mexiletine (MEXITIL) 150 MG capsule TAKE 2 CAPSULES (300 MG TOTAL) BY MOUTH EVERY 12 (TWELVE) HOURS.   Multiple Vitamins-Minerals (WOMENS MULTI PO) Take 1 tablet by mouth daily.   potassium chloride (KLOR-CON) 10 MEQ tablet Take 1 tablet (10 mEq total) by mouth daily.   sacubitril-valsartan  (ENTRESTO) 24-26 MG Take 1 tablet by mouth 2 (two) times daily.   TRUEplus Lancets 28G MISC CHECK BLOOD GLUCOSE TWICE A DAY   No current facility-administered medications for this visit. (Other)   REVIEW OF SYSTEMS: ROS   Positive for: Endocrine, Cardiovascular, Eyes Negative for: Constitutional, Gastrointestinal, Neurological, Skin, Genitourinary, Musculoskeletal, HENT, Respiratory, Psychiatric, Allergic/Imm, Heme/Lymph Last edited by Kingsley Spittle, COT on 12/11/2021  8:31 AM.      ALLERGIES Allergies  Allergen Reactions   Adhesive [Tape]     Tears skin, can tolerate paper tape   PAST MEDICAL HISTORY Past Medical History:  Diagnosis Date   Atrial fibrillation with RVR ([place])    [place] 01/2020   CHF (congestive heart failure) ([place]) 10/20/2019   CKD (chronic kidney disease), stage IV (HCC)    Diabetes mellitus without complication ([place])    Diabetic retinopathy ([place])    DOE (dyspnea on exertion)    walking upstairs or up hill resolves in one minute   Dysrhythmia    a-fib - on pradaxa   Fibroids    History of kidney stones    History of recent blood transfusion 02/26/2020   Hypertension    Iron deficiency anemia    Non-ischemic cardiomyopathy ([place])    tachycardia induced   Obese    Peripheral edema    Premature ventricular contractions (PVCs) ([facility])    Sleep apnea    does not use cpap   Stroke ([place]) 89/3734   Umbilical hernia    Wears glasses    Past Surgical History:  Procedure Laterality Date   CHOLECYSTECTOMY     CYSTOSCOPY W/ URETERAL STENT PLACEMENT Bilateral 05/24/2020   Procedure: CYSTOSCOPY WITH RETROGRADE PYELOGRAM/URETERAL STENT PLACEMENT;  Surgeon: Robley Fries, MD;  Location: [facility];  Service: Urology;  Laterality: Bilateral;   CYSTOSCOPY/RETROGRADE/URETEROSCOPY Bilateral 04/03/2020   Procedure: CYSTOSCOPY/RETROGRADE/URETEROSCOPY;  Surgeon: Ardis Hughs, MD;  Location: [facility];  Service: Urology;  Laterality: Bilateral;   EYE SURGERY  Left 08/2021   cataract removed   HYSTERECTOMY ABDOMINAL WITH SALPINGO-OOPHORECTOMY  07/21/2020   Procedure: HYSTERECTOMY ABDOMINAL WITH BILATERAL SALPINGO-OOPHORECTOMY;  Surgeon: Sanjuana Kava, MD;  Location: [facility];  Service: Gynecology;;   INJECTION OF SILICONE OIL Left 28/76/8115   Procedure: INJECTION OF SILICONE OIL;  Surgeon: Bernarda Caffey, MD;  Location: [facility];  Service: Ophthalmology;  Laterality: Left;   IR FLUORO GUIDE CV LINE RIGHT  02/21/2020   IR US GUIDE VASC ACCESS RIGHT  02/21/2020   MEMBRANE PEEL Left 10/08/2021   Procedure: MEMBRANE PEEL;  Surgeon: Bernarda Caffey, MD;  Location: [facility];  Service: Ophthalmology;  Laterality: Left;   NEPHROLITHOTOMY Left 02/14/2020   Procedure: NEPHROLITHOTOMY PERCUTANEOUS/ SURGEON ACCESS/ LEFT PERCUTANEOUS NEPHROSTOMY TUBE PLACEMENT;  Surgeon: Ardis Hughs, MD;  Location: [facility];  Service: Urology;  Laterality: Left;   NEPHROLITHOTOMY Left 02/21/2020  Procedure: NEPHROLITHOTOMY PERCUTANEOUS SECOND LOOK;  Surgeon: Ardis Hughs, MD;  Location: [facility];  Service: Urology;  Laterality: Left;   NEPHROLITHOTOMY Left 02/26/2020   Procedure: NEPHROLITHOTOMY PERCUTANEOUS;  Surgeon: Ceasar Mons, MD;  Location: [facility];  Service: Urology;  Laterality: Left;  NEED 150 MIN   NEPHROLITHOTOMY Right 03/24/2020   Procedure: NEPHROLITHOTOMY PERCUTANEOUS WITH ACCESS LEFT STENT REMOVAL;  Surgeon: Ardis Hughs, MD;  Location: [facility];  Service: Urology;  Laterality: Right;   PARS PLANA VITRECTOMY Left 10/08/2021   Procedure: PARS PLANA VITRECTOMY WITH 25 GAUGE;  Surgeon: Bernarda Caffey, MD;  Location: [facility];  Service: Ophthalmology;  Laterality: Left;   PHOTOCOAGULATION WITH LASER Left 10/08/2021   Procedure: PHOTOCOAGULATION WITH LASER;  Surgeon: Bernarda Caffey, MD;  Location: [facility];  Service: Ophthalmology;  Laterality: Left;   RIGHT HEART CATH N/A 11/09/2019   Procedure: RIGHT HEART CATH;  Surgeon: Jolaine Artist, MD;  Location:  [facility];  Service: Cardiovascular;  Laterality: N/A;   WISDOM TOOTH EXTRACTION     FAMILY HISTORY Family History  Problem Relation Age of Onset   Atrial fibrillation Mother    Hypertension Mother    Stroke Mother 27   Diabetes Mother    Diabetes Father    Hypertension Father    Diabetes Sister    Hypertension Sister    Diabetes Brother    Hypertension Brother    Diabetes Brother    Heart attack Brother    Stroke Maternal Grandmother 98   SOCIAL HISTORY Social History   Tobacco Use   Smoking status: Never   Smokeless tobacco: Never  Vaping Use   Vaping Use: Never used  Substance Use Topics   Alcohol use: Never   Drug use: Never       OPHTHALMIC EXAM:  Base Eye Exam     Visual Acuity (Snellen - Linear)       Right Left   Dist cc 20/40 +2 CF at 3'   Dist ph cc NI NI    Correction: Glasses         Tonometry (Tonopen, 8:42 AM)       Right Left   Pressure 14 22         Pupils       Dark Light Shape React APD   Right 4 3 Round Minimal None   Left 4 4 Round None None         Visual Fields       Left Right     Full   Restrictions Total superior temporal, superior nasal, inferior nasal deficiencies; Partial outer inferior temporal deficiency          Extraocular Movement       Right Left    Full, Ortho Full, Ortho         Neuro/Psych     Oriented x3: Yes   Mood/Affect: Normal         Dilation     Both eyes: 1.0% Mydriacyl, 2.5% Phenylephrine @ 8:44 AM           Slit Lamp and Fundus Exam     External Exam       Right Left   External Normal Normal         Slit Lamp Exam       Right Left   Lids/Lashes Dermatochalasis - upper lid Dermatochalasis - upper lid   Conjunctiva/Sclera Melanosis sutures dissolved, mild melanosis   Cornea mild arcus, trace PEE  mild arcus, well healed cataract wound, 1+ Punctate epithelial erosions   Anterior Chamber Deep and clear Deep and quiet, 1-2+ cell and pigment   Iris  Round and dilated, No NVI Round and dilated, No NVI   Lens 1-2+ Nuclear sclerosis, 1-2+ Cortical cataract, fine pigment deposition on posterior capsule PC IOL in good position, pigment deposition on optic, 1-2+PCO   Anterior Vitreous Vitreous syneresis, fine RBC, blood stained vitreous condensations post vitrectomy, good oil fill, +pigment         Fundus Exam       Right Left   Disc Pink and Sharp, +fibrosis, +NVD - regressing, mild PPP, Compact 1+pallor, sharp nasal rim, +temporal edema - improved   C/D Ratio 0.2 0.2   Macula Flat, Blunted foveal reflex, scattered MA, RPE mottling Flat under oil, fibrosis improved, scattered IRH/DBH, residual fibrosis and thickening nasal macula -- improving   Vessels attenuated, Tortuous, peripheral sclerosis Severe attenuation / sclerosis, +fibrosis improved, tortuousity   Periphery Hazy view, Attached, scattered patches of fibrosis and NVE, small flap tear at 0130 equator--no SRF, old VH settled inferiorly - improving; PRP laser changes Attached, Scattered DBH; good PRP changes 360; scattered fibrosis           Refraction     Wearing Rx       Sphere Cylinder Axis   Right -2.50 +1.75 178   Left -2.50 +1.25 018         Manifest Refraction       Sphere Cylinder Axis Dist VA   Right -3.00 +1.00 180 20/40   Left def               IMAGING AND PROCEDURES  Imaging and Procedures for 12/11/2021  OCT, Retina - OU - Both Eyes       Right Eye Quality was good. Central Foveal Thickness: 214. Progression has worsened. Findings include normal foveal contour, no IRF, no SRF, intraretinal hyper-reflective material (Trace cystic changes, partial PVD and disc fibrosis visible on widefield, interval increase in vitreous opacities).   Left Eye Quality was good. Central Foveal Thickness: 507. Progression has improved. Findings include abnormal foveal contour, intraretinal fluid, no SRF, outer retinal atrophy, inner retinal atrophy, macular pucker  (retina attached under oil; diffuse atrophy; mild interval improvement in IRF nasal macula).   Notes *Images captured and stored on drive  Diagnosis / Impression:  OD: Trace cystic changes, partial PVD and disc fibrosis visible on widefield, interval increase in vitreous opacities OS: retina attached under oil; diffuse atrophy; mild interval improvement in IRF nasal macula  Clinical management:  See below  Abbreviations: NFP - Normal foveal profile. CME - cystoid macular edema. PED - pigment epithelial detachment. IRF - intraretinal fluid. SRF - subretinal fluid. EZ - ellipsoid zone. ERM - epiretinal membrane. ORA - outer retinal atrophy. ORT - outer retinal tubulation. SRHM - subretinal hyper-reflective material. IRHM - intraretinal hyper-reflective material      Intravitreal Injection, Pharmacologic Agent - OD - Right Eye       Time Out 12/11/2021. 9:10 AM. Confirmed correct patient, procedure, site, and patient consented.   Anesthesia Topical anesthesia was used. Anesthetic medications included Lidocaine 2%, Lidocaine 4%, Proparacaine 0.5%.   Procedure Preparation included 5% betadine to ocular surface, eyelid speculum. A supplied needle was used.   Injection: 1.25 mg Bevacizumab 1.25mg /0.16ml   Route: Intravitreal, Site: Right Eye   NDC: H061816, Lot: [id number] , Expiration date: 01/26/2022, Waste: 0 mL   Post-op Post injection exam found visual acuity  of at least counting fingers. The patient tolerated the procedure well. There were no complications. The patient received written and verbal post procedure care education. Post injection medications were not given.             ASSESSMENT/PLAN:    ICD-10-CM   1. Vitreous hemorrhage of right eye (HCC)  H43.11 Intravitreal Injection, Pharmacologic Agent - OD - Right Eye    Bevacizumab (AVASTIN) SOLN 1.25 mg    2. 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 Vitreous hemorrhage of left eye (HCC)  H43.12 OCT, Retina - OU - Both Eyes    4. Traction detachment of left retina  H33.42     5. Retinal tear of right eye  H33.311     6. Essential hypertension  I10     7. Hypertensive retinopathy of both eyes  H35.033     8. Combined forms of age-related cataract of right eye  H25.811     9. Pseudophakia  Z96.1       VH OD - Pt presents acutely today for new floaters and decreased vision OD since Tuesday (12.20.22) upon waking,  - pt reports slight improvement in vision since then - exam shows blood stained vit condensations without RT/RD - likely related to history of PDR and HTN  - recommend IVA OD #1 today, 12.23.22  - pt in agreement  - RBA of procedure discussed, questions answered - IVA informed consent obtained and signed, 12.23.22 (OD) - see procedure note - f/u as scheduled on December 23, 2021  2-4. Proliferative diabetic retinopathy OU  - s/p PRP OD (09.19.22)             - OD: w/ +NVD  - OS: with chronic, diffuse VH and underlying TRD - exam shows +NVD and scattered fibrosis OD, OS no view posteriorly due to dense VH - bscan OS 9.14.22 and 10.10.22 shows diffuse VH OS, hyperechoic membranes, +TRD             - POW9 s/p PPV/MP/EL/FAX/silicone oil (5784 cs) OS 10.20.22             - intra-op -- retina beneath chronic, dense VH was severely ischemic, florid fibrosis w/ +360 posterior TRD w/ stretch holes             - good oil fill, retina attached under oil; fibrosis improved, and good laser changes in place  - BCVA remains CF OS             - IOP slightly elevated at 22  - OCT shows retina attached under oil; diffuse atrophy; mild interval improvement in IRF nasal macula             - cont PF  QID OS    Ketorolac QID OS PSO ung QID OS             - avoid laying flat on back              - post op drop and positioning instructions reviewed              - F/u as scheduled, DFE, OCT possible injection  5. Retinal  tear, OD - small flap tear located at 0130 equator, no SRF - lasered with PRP OD as above on 9.19.22  6,7. Hypertensive retinopathy OU - discussed importance of tight BP control - monitor   8. Mixed Cataract OD - The symptoms of cataract, surgical options, and treatments and risks were discussed with patient.  - discussed diagnosis and progression - under the expert management of Dr. Wyatt Portela  9. Pseudophakia OS  - s/p CE/IOL (Dr. Katy Fitch)  - IOL in good position, doing well  - monitor   Ophthalmic Meds Ordered this visit:  Meds ordered this encounter  Medications   Bevacizumab (AVASTIN) SOLN 1.25 mg      Return in about 12 days (around 12/23/2021) for POV as scheduled.  There are no Patient Instructions on file for this visit.  This document serves as a record of services personally performed by Gardiner Sleeper, MD, PhD. It was created on their behalf by Leonie Douglas, an ophthalmic technician. The creation of this record is the provider's dictation and/or activities during the visit.    Electronically signed by: Leonie Douglas COA, 12/11/21  12:59 PM  This document serves as a record of services personally performed by Gardiner Sleeper, MD, PhD. It was created on their behalf by San Jetty. Owens Shark, OA an ophthalmic technician. The creation of this record is the provider's dictation and/or activities during the visit.    Electronically signed by: San Jetty. Owens Shark, OA @TODAY @ 12:59 PM  Gardiner Sleeper, M.D., Ph.D. Diseases & Surgery of [facility] [place] 12/11/2021  I have reviewed the above documentation for accuracy and completeness, and I agree with the above. Gardiner Sleeper, M.D., Ph.D. 12/11/21 12:59  PM   Abbreviations: M myopia (nearsighted); A astigmatism; H hyperopia (farsighted); P presbyopia; Mrx spectacle prescription;  CTL contact lenses; OD right eye; OS left eye; OU both eyes  XT exotropia; ET esotropia; PEK punctate epithelial keratitis; PEE punctate epithelial erosions; DES dry eye syndrome; MGD meibomian gland dysfunction; ATs artificial tears; PFAT's preservative free artificial tears; [place] nuclear sclerotic cataract; PSC posterior subcapsular cataract; ERM epi-retinal membrane; PVD posterior vitreous detachment; RD retinal detachment; DM diabetes mellitus; DR diabetic retinopathy; NPDR non-proliferative diabetic retinopathy; PDR proliferative diabetic retinopathy; CSME clinically significant macular edema; DME diabetic macular edema; dbh dot blot hemorrhages; CWS cotton wool spot; POAG primary open angle glaucoma; C/D cup-to-disc ratio; HVF humphrey visual field; GVF goldmann visual field; OCT optical coherence tomography; IOP intraocular pressure; BRVO Branch retinal vein occlusion; CRVO central retinal vein occlusion; CRAO central retinal artery occlusion; BRAO branch retinal artery occlusion; RT retinal tear; SB scleral buckle; PPV pars plana vitrectomy; VH Vitreous hemorrhage; PRP panretinal laser photocoagulation; IVK intravitreal kenalog; VMT vitreomacular traction; MH Macular hole;  NVD neovascularization of the disc; NVE neovascularization elsewhere; AREDS age related eye disease study; ARMD age related macular degeneration; POAG primary open angle glaucoma; EBMD epithelial/anterior basement membrane dystrophy; ACIOL anterior chamber intraocular lens; IOL intraocular lens; PCIOL posterior chamber intraocular lens; Phaco/IOL phacoemulsification with intraocular lens placement; [place] photorefractive keratectomy; LASIK laser assisted in situ keratomileusis; HTN hypertension; DM diabetes mellitus; COPD chronic obstructive pulmonary disease

## 2021-12-11 ENCOUNTER — Encounter (INDEPENDENT_AMBULATORY_CARE_PROVIDER_SITE_OTHER): Payer: Self-pay | Admitting: Ophthalmology

## 2021-12-11 ENCOUNTER — Other Ambulatory Visit: Payer: Self-pay

## 2021-12-11 ENCOUNTER — Ambulatory Visit (INDEPENDENT_AMBULATORY_CARE_PROVIDER_SITE_OTHER): Payer: MEDICAID | Admitting: Ophthalmology

## 2021-12-11 VITALS — BP 139/76 | HR 38

## 2021-12-11 DIAGNOSIS — H35033 Hypertensive retinopathy, bilateral: Secondary | ICD-10-CM

## 2021-12-11 DIAGNOSIS — H33311 Horseshoe tear of retina without detachment, right eye: Secondary | ICD-10-CM

## 2021-12-11 DIAGNOSIS — Z961 Presence of intraocular lens: Secondary | ICD-10-CM

## 2021-12-11 DIAGNOSIS — H4311 Vitreous hemorrhage, right eye: Secondary | ICD-10-CM

## 2021-12-11 DIAGNOSIS — H3342 Traction detachment of retina, left eye: Secondary | ICD-10-CM

## 2021-12-11 DIAGNOSIS — H4312 Vitreous hemorrhage, left eye: Secondary | ICD-10-CM

## 2021-12-11 DIAGNOSIS — E113593 Type 2 diabetes mellitus with proliferative diabetic retinopathy without macular edema, bilateral: Secondary | ICD-10-CM

## 2021-12-11 DIAGNOSIS — I10 Essential (primary) hypertension: Secondary | ICD-10-CM

## 2021-12-11 DIAGNOSIS — H25811 Combined forms of age-related cataract, right eye: Secondary | ICD-10-CM

## 2021-12-11 MED ORDER — BEVACIZUMAB CHEMO INJECTION 1.25MG/0.05ML SYRINGE FOR KALEIDOSCOPE
1.2500 mg | INTRAVITREAL | Status: AC | PRN
  Administered 2021-12-11: 13:00:00 1.25 mg via INTRAVITREAL

## 2021-12-17 NOTE — Progress Notes (Shared)
[facility] Note  12/23/2021     CHIEF COMPLAINT Patient presents for No chief complaint on file.    HISTORY OF PRESENT ILLNESS: Adrienne Lane is a 55 y.o. female who presents to the clinic today for:       Referring physician: Inc, [facility] [place],  [place] [address]  HISTORICAL INFORMATION:   Selected notes from the MEDICAL RECORD NUMBER Referred by Dr. Wyatt Portela for concern of VH   CURRENT MEDICATIONS: Current Outpatient Medications (Ophthalmic Drugs)  Medication Sig   Bromfenac Sodium (PROLENSA) 0.07 % SOLN Place 1 drop into the left eye 4 (four) times daily.   ketorolac (ACULAR) 0.5 % ophthalmic solution Place 1 drop into the left eye 4 (four) times daily.   prednisoLONE acetate (PRED FORTE) 1 % ophthalmic suspension Place 1 drop into the left eye 4 (four) times daily.   No current facility-administered medications for this visit. (Ophthalmic Drugs)   Current Outpatient Medications (Other)  Medication Sig   acetaminophen (TYLENOL) 500 MG tablet Take 500 mg by mouth every 6 (six) hours as needed for moderate pain or headache.   amLODipine (NORVASC) 10 MG tablet Take 1 tablet (10 mg total) by mouth daily.   aspirin 81 MG chewable tablet Chew 1 tablet (81 mg total) by mouth daily.   atorvastatin (LIPITOR) 80 MG tablet take 1 tablet (80 mg) by oral route once daily at bedtime for 90 days   carvedilol (COREG) 6.25 MG tablet Take 1 tablet (6.25 mg total) by mouth 2 (two) times daily with a meal.   cholecalciferol (VITAMIN D3) 25 MCG (1000 UNIT) tablet Take 1,000 Units by mouth daily.   dabigatran (PRADAXA) 150 MG CAPS capsule TAKE 1 CAPSULE (150 MG TOTAL) BY MOUTH EVERY 12 HOURS.   dapagliflozin propanediol (FARXIGA) 10 MG TABS tablet Take 1 tablet (10 mg total) by mouth daily before breakfast.   glucose blood (TRUE METRIX BLOOD GLUCOSE TEST) test strip Use as instructed   hydrALAZINE (APRESOLINE) 100  MG tablet Take 1 tablet (100 mg total) by mouth 3 (three) times daily.   insulin aspart (NOVOLOG) 100 UNIT/ML injection Use as directed sliding scale insulin TID. (Patient taking differently: Inject 2-5 Units into the skin daily. Use as directed sliding scale insulin TID.)   insulin glargine (LANTUS) 100 UNIT/ML injection INJECT 0.22 MLS (22 UNITS TOTAL) INTO THE SKIN EVERY MORNING.   Insulin Syringe-Needle U-100 (TRUEPLUS INSULIN SYRINGE) 30G X 5/16" 0.3 ML MISC USE AS DIRECTED 3 TIMES DAILY.   Insulin Syringe-Needle U-100 30G X 5/16" 0.5 ML MISC Use as directed.   isosorbide mononitrate (IMDUR) 60 MG 24 hr tablet Take 1 tablet (60 mg total) by mouth daily.   Lancets (ONETOUCH ULTRASOFT) lancets Check CBG twice a day   mexiletine (MEXITIL) 150 MG capsule TAKE 2 CAPSULES (300 MG TOTAL) BY MOUTH EVERY 12 (TWELVE) HOURS.   Multiple Vitamins-Minerals (WOMENS MULTI PO) Take 1 tablet by mouth daily.   potassium chloride (KLOR-CON) 10 MEQ tablet Take 1 tablet (10 mEq total) by mouth daily.   sacubitril-valsartan (ENTRESTO) 24-26 MG Take 1 tablet by mouth 2 (two) times daily.   TRUEplus Lancets 28G MISC CHECK BLOOD GLUCOSE TWICE A DAY   No current facility-administered medications for this visit. (Other)   REVIEW OF SYSTEMS:    ALLERGIES Allergies  Allergen Reactions   Adhesive [Tape]     Tears skin, can tolerate paper tape   PAST MEDICAL  HISTORY Past Medical History:  Diagnosis Date   Atrial fibrillation with RVR ([place])    [place] 01/2020   CHF (congestive heart failure) ([place]) 10/20/2019   CKD (chronic kidney disease), stage IV (HCC)    Diabetes mellitus without complication (HCC)    Diabetic retinopathy ([place])    DOE (dyspnea on exertion)    walking upstairs or up hill resolves in one minute   Dysrhythmia    a-fib - on pradaxa   Fibroids    History of kidney stones    History of recent blood transfusion 02/26/2020   Hypertension    Iron deficiency anemia    Non-ischemic  cardiomyopathy (HCC)    tachycardia induced   Obese    Peripheral edema    Premature ventricular contractions (PVCs) (VPCs)    Sleep apnea    does not use cpap   Stroke ([place]) 67/6720   Umbilical hernia    Wears glasses    Past Surgical History:  Procedure Laterality Date   CHOLECYSTECTOMY     CYSTOSCOPY W/ URETERAL STENT PLACEMENT Bilateral 05/24/2020   Procedure: CYSTOSCOPY WITH RETROGRADE PYELOGRAM/URETERAL STENT PLACEMENT;  Surgeon: Robley Fries, MD;  Location: [facility];  Service: Urology;  Laterality: Bilateral;   CYSTOSCOPY/RETROGRADE/URETEROSCOPY Bilateral 04/03/2020   Procedure: CYSTOSCOPY/RETROGRADE/URETEROSCOPY;  Surgeon: Ardis Hughs, MD;  Location: [facility];  Service: Urology;  Laterality: Bilateral;   EYE SURGERY Left 08/2021   cataract removed   HYSTERECTOMY ABDOMINAL WITH SALPINGO-OOPHORECTOMY  07/21/2020   Procedure: HYSTERECTOMY ABDOMINAL WITH BILATERAL SALPINGO-OOPHORECTOMY;  Surgeon: Sanjuana Kava, MD;  Location: [facility];  Service: Gynecology;;   INJECTION OF SILICONE OIL Left 94/70/9628   Procedure: INJECTION OF SILICONE OIL;  Surgeon: Bernarda Caffey, MD;  Location: [facility];  Service: Ophthalmology;  Laterality: Left;   IR FLUORO GUIDE CV LINE RIGHT  02/21/2020   IR US GUIDE VASC ACCESS RIGHT  02/21/2020   MEMBRANE PEEL Left 10/08/2021   Procedure: MEMBRANE PEEL;  Surgeon: Bernarda Caffey, MD;  Location: [facility];  Service: Ophthalmology;  Laterality: Left;   NEPHROLITHOTOMY Left 02/14/2020   Procedure: NEPHROLITHOTOMY PERCUTANEOUS/ SURGEON ACCESS/ LEFT PERCUTANEOUS NEPHROSTOMY TUBE PLACEMENT;  Surgeon: Ardis Hughs, MD;  Location: [facility];  Service: Urology;  Laterality: Left;   NEPHROLITHOTOMY Left 02/21/2020   Procedure: NEPHROLITHOTOMY PERCUTANEOUS SECOND LOOK;  Surgeon: Ardis Hughs, MD;  Location: [facility];  Service: Urology;  Laterality: Left;   NEPHROLITHOTOMY Left 02/26/2020   Procedure: NEPHROLITHOTOMY PERCUTANEOUS;  Surgeon: Ceasar Mons, MD;  Location: [facility];  Service: Urology;  Laterality: Left;  NEED 150 MIN   NEPHROLITHOTOMY Right 03/24/2020   Procedure: NEPHROLITHOTOMY PERCUTANEOUS WITH ACCESS LEFT STENT REMOVAL;  Surgeon: Ardis Hughs, MD;  Location: [facility];  Service: Urology;  Laterality: Right;   PARS PLANA VITRECTOMY Left 10/08/2021   Procedure: PARS PLANA VITRECTOMY WITH 25 GAUGE;  Surgeon: Bernarda Caffey, MD;  Location: [facility];  Service: Ophthalmology;  Laterality: Left;   PHOTOCOAGULATION WITH LASER Left 10/08/2021   Procedure: PHOTOCOAGULATION WITH LASER;  Surgeon: Bernarda Caffey, MD;  Location: [facility];  Service: Ophthalmology;  Laterality: Left;   RIGHT HEART CATH N/A 11/09/2019   Procedure: RIGHT HEART CATH;  Surgeon: Jolaine Artist, MD;  Location: [facility];  Service: Cardiovascular;  Laterality: N/A;   WISDOM TOOTH EXTRACTION     FAMILY HISTORY Family History  Problem Relation Age of Onset   Atrial fibrillation Mother    Hypertension Mother    Stroke Mother 39   Diabetes  Mother    Diabetes Father    Hypertension Father    Diabetes Sister    Hypertension Sister    Diabetes Brother    Hypertension Brother    Diabetes Brother    Heart attack Brother    Stroke Maternal Grandmother 37   SOCIAL HISTORY Social History   Tobacco Use   Smoking status: Never   Smokeless tobacco: Never  Vaping Use   Vaping Use: Never used  Substance Use Topics   Alcohol use: Never   Drug use: Never       OPHTHALMIC EXAM:  Not recorded     IMAGING AND PROCEDURES  Imaging and Procedures for 12/23/2021           ASSESSMENT/PLAN:  No diagnosis found.   VH OD - Pt presents acutely today for new floaters and decreased vision OD since Tuesday (12.20.22) upon waking,  - pt reports slight improvement in vision since then - exam shows blood stained vit condensations without RT/RD - likely related to history of PDR and HTN - s/p IVA OD #1 (12.23.22),  - recommend IVA OD #2 today,  01.04.23  - pt in agreement  - RBA of procedure discussed, questions answered - IVA informed consent obtained and signed, 12.23.22 (OD) - see procedure note - f/u as scheduled on December 23, 2021  2-4. Proliferative diabetic retinopathy OU  - s/p PRP OD (09.19.22)             - OD: w/ +NVD  - OS: with chronic, diffuse VH and underlying TRD - exam shows +NVD and scattered fibrosis OD, OS no view posteriorly due to dense VH - bscan OS 9.14.22 and 10.10.22 shows diffuse VH OS, hyperechoic membranes, +TRD             - POW9 s/p PPV/MP/EL/FAX/silicone oil (2595 cs) OS 10.20.22             - intra-op -- retina beneath chronic, dense VH was severely ischemic, florid fibrosis w/ +360 posterior TRD w/ stretch holes             - good oil fill, retina attached under oil; fibrosis improved, and good laser changes in place  - BCVA remains CF OS             - IOP slightly elevated at 22  - OCT shows retina attached under oil; diffuse atrophy; mild interval improvement in IRF nasal macula             - cont PF QID OS    Ketorolac QID OS PSO ung QID OS             - avoid laying flat on back             - post op drop and positioning instructions reviewed              - F/u as scheduled, DFE, OCT possible injection  5. Retinal tear, OD - small flap tear located at 0130 equator, no SRF - lasered with PRP OD as above on 9.19.22  6,7. Hypertensive retinopathy OU - discussed importance of tight BP control - monitor   8. Mixed Cataract OD - The symptoms of cataract, surgical options, and treatments and risks were discussed with patient.  - discussed diagnosis and progression - under the expert management of Dr. Wyatt Portela  9. Pseudophakia OS  - s/p CE/IOL (Dr. Katy Fitch)  - IOL in good position, doing well  - monitor  Ophthalmic Meds Ordered this visit:  No orders of the defined types were placed in this encounter.     No follow-ups on file.  There are no Patient Instructions on file for  this visit.  This document serves as a record of services personally performed by Gardiner Sleeper, MD, PhD. It was created on their behalf by Orvan Falconer, an ophthalmic technician. The creation of this record is the provider's dictation and/or activities during the visit.    Electronically signed by: Orvan Falconer, OA, 12/17/21  11:14 AM   Gardiner Sleeper, M.D., Ph.D. Diseases & Surgery of the Retina and Bivalve 12/11/2021  I have reviewed the above documentation for accuracy and completeness, and I agree with the above. Gardiner Sleeper, M.D., Ph.D. 12/11/21 11:14 AM   Abbreviations: M myopia (nearsighted); A astigmatism; H hyperopia (farsighted); P presbyopia; Mrx spectacle prescription;  CTL contact lenses; OD right eye; OS left eye; OU both eyes  XT exotropia; ET esotropia; PEK punctate epithelial keratitis; PEE punctate epithelial erosions; DES dry eye syndrome; MGD meibomian gland dysfunction; ATs artificial tears; PFAT's preservative free artificial tears; [place] nuclear sclerotic cataract; PSC posterior subcapsular cataract; ERM epi-retinal membrane; PVD posterior vitreous detachment; RD retinal detachment; DM diabetes mellitus; DR diabetic retinopathy; NPDR non-proliferative diabetic retinopathy; PDR proliferative diabetic retinopathy; CSME clinically significant macular edema; DME diabetic macular edema; dbh dot blot hemorrhages; CWS cotton wool spot; POAG primary open angle glaucoma; C/D cup-to-disc ratio; HVF humphrey visual field; GVF goldmann visual field; OCT optical coherence tomography; IOP intraocular pressure; BRVO Branch retinal vein occlusion; CRVO central retinal vein occlusion; CRAO central retinal artery occlusion; BRAO branch retinal artery occlusion; RT retinal tear; SB scleral buckle; PPV pars plana vitrectomy; VH Vitreous hemorrhage; PRP panretinal laser photocoagulation; IVK intravitreal kenalog; VMT vitreomacular traction; MH  Macular hole;  NVD neovascularization of the disc; NVE neovascularization elsewhere; AREDS age related eye disease study; ARMD age related macular degeneration; POAG primary open angle glaucoma; EBMD epithelial/anterior basement membrane dystrophy; ACIOL anterior chamber intraocular lens; IOL intraocular lens; PCIOL posterior chamber intraocular lens; Phaco/IOL phacoemulsification with intraocular lens placement; [place] photorefractive keratectomy; LASIK laser assisted in situ keratomileusis; HTN hypertension; DM diabetes mellitus; COPD chronic obstructive pulmonary disease

## 2021-12-23 ENCOUNTER — Other Ambulatory Visit: Payer: Self-pay | Admitting: Internal Medicine

## 2021-12-23 ENCOUNTER — Encounter (INDEPENDENT_AMBULATORY_CARE_PROVIDER_SITE_OTHER): Payer: MEDICAID | Admitting: Ophthalmology

## 2021-12-23 DIAGNOSIS — Z794 Long term (current) use of insulin: Secondary | ICD-10-CM

## 2021-12-23 DIAGNOSIS — E1165 Type 2 diabetes mellitus with hyperglycemia: Secondary | ICD-10-CM

## 2021-12-24 ENCOUNTER — Other Ambulatory Visit (HOSPITAL_COMMUNITY): Payer: Self-pay

## 2021-12-24 ENCOUNTER — Other Ambulatory Visit: Payer: Self-pay

## 2021-12-25 ENCOUNTER — Other Ambulatory Visit: Payer: Self-pay

## 2021-12-25 ENCOUNTER — Other Ambulatory Visit (HOSPITAL_COMMUNITY): Payer: Self-pay

## 2021-12-28 ENCOUNTER — Other Ambulatory Visit: Payer: Self-pay | Admitting: Internal Medicine

## 2021-12-28 ENCOUNTER — Other Ambulatory Visit (HOSPITAL_COMMUNITY): Payer: Self-pay

## 2021-12-28 ENCOUNTER — Other Ambulatory Visit (HOSPITAL_BASED_OUTPATIENT_CLINIC_OR_DEPARTMENT_OTHER): Payer: Self-pay

## 2021-12-28 ENCOUNTER — Other Ambulatory Visit: Payer: Self-pay

## 2021-12-28 DIAGNOSIS — E1165 Type 2 diabetes mellitus with hyperglycemia: Secondary | ICD-10-CM

## 2021-12-28 DIAGNOSIS — Z794 Long term (current) use of insulin: Secondary | ICD-10-CM

## 2021-12-28 MED ORDER — INSULIN GLARGINE 100 UNIT/ML ~~LOC~~ SOLN
SUBCUTANEOUS | 0 refills | Status: DC
  Filled 2021-12-28: qty 10, 28d supply, fill #0

## 2021-12-28 MED ORDER — "INSULIN SYRINGE-NEEDLE U-100 30G X 5/16"" 0.3 ML MISC"
1 refills | Status: AC
  Filled 2021-12-28: qty 100, 33d supply, fill #0
  Filled 2022-07-26: qty 100, 33d supply, fill #1

## 2021-12-29 ENCOUNTER — Other Ambulatory Visit: Payer: Self-pay

## 2021-12-30 ENCOUNTER — Other Ambulatory Visit: Payer: Self-pay

## 2022-01-12 NOTE — Progress Notes (Addendum)
[facility] Note  01/15/2022     CHIEF COMPLAINT Patient presents for Retina Follow Up    HISTORY OF PRESENT ILLNESS: Adrienne Lane is a 56 y.o. female who presents to the clinic today for:  HPI     Retina Follow Up   Patient presents with  Other.  In right eye.  This started 5 weeks ago.  Severity is moderate.  Duration of 5 weeks.  Since onset it is stable.  I, the attending physician,  performed the HPI with the patient and updated documentation appropriately.        Comments   56 y/o female pt here for 5 wk f/u for [facility] OD/PDR OU.  No change in [place] OD noticed.  Can now see better out of her peripheral vision OS.  Denies pain, FOL, floaters.  BS 122 this a.m.  A1C 7.2 1 wk ago.  Pred QID OS Ketorolac QID OS "Clear bottle" BID OD      Last edited by Bernarda Caffey, MD on 01/15/2022  1:56 PM.       Referring physician: Debbra Riding, MD [address] [place],  [place] [address]  HISTORICAL INFORMATION:   Selected notes from the MEDICAL RECORD NUMBER Referred by Dr. Wyatt Portela for concern of VH   CURRENT MEDICATIONS: Current Outpatient Medications (Ophthalmic Drugs)  Medication Sig   Bromfenac Sodium (PROLENSA) 0.07 % SOLN Place 1 drop into the left eye 4 (four) times daily.   ketorolac (ACULAR) 0.5 % ophthalmic solution Place 1 drop into the left eye 4 (four) times daily.   prednisoLONE acetate (PRED FORTE) 1 % ophthalmic suspension Place 1 drop into the left eye 4 (four) times daily.   No current facility-administered medications for this visit. (Ophthalmic Drugs)   Current Outpatient Medications (Other)  Medication Sig   acetaminophen (TYLENOL) 500 MG tablet Take 500 mg by mouth every 6 (six) hours as needed for moderate pain or headache.   amLODipine (NORVASC) 10 MG tablet Take 1 tablet (10 mg total) by mouth daily.   atorvastatin (LIPITOR) 80 MG tablet Take 1 tablet (80 mg total) by mouth daily.   carvedilol (COREG) 6.25  MG tablet Take 1 tablet (6.25 mg total) by mouth 2 (two) times daily with a meal.   cholecalciferol (VITAMIN D3) 25 MCG (1000 UNIT) tablet Take 1,000 Units by mouth daily.   dabigatran (PRADAXA) 150 MG CAPS capsule TAKE 1 CAPSULE (150 MG TOTAL) BY MOUTH EVERY 12 HOURS.   dapagliflozin propanediol (FARXIGA) 10 MG TABS tablet Take 1 tablet (10 mg total) by mouth daily before breakfast.   glucose blood (TRUE METRIX BLOOD GLUCOSE TEST) test strip Use as instructed   hydrALAZINE (APRESOLINE) 100 MG tablet Take 1 tablet (100 mg total) by mouth 3 (three) times daily.   insulin aspart (NOVOLOG) 100 UNIT/ML injection Use as directed sliding scale insulin TID. (Patient taking differently: Inject 2-5 Units into the skin daily. Use as directed sliding scale insulin TID.)   insulin glargine (LANTUS) 100 UNIT/ML injection inject 22 by subcutaneous route as per insulin protocol for 30 days   Insulin Syringe-Needle U-100 (TRUEPLUS INSULIN SYRINGE) 30G X 5/16" 0.3 ML MISC USE AS DIRECTED 3 TIMES DAILY.   Insulin Syringe-Needle U-100 30G X 5/16" 0.5 ML MISC Use as directed.   isosorbide mononitrate (IMDUR) 60 MG 24 hr tablet Take 1 tablet (60 mg total) by mouth daily.   Lancets (ONETOUCH ULTRASOFT) lancets Check CBG twice a  day   mexiletine (MEXITIL) 150 MG capsule TAKE 2 CAPSULES (300 MG TOTAL) BY MOUTH EVERY 12 (TWELVE) HOURS.   Multiple Vitamins-Minerals (WOMENS MULTI PO) Take 1 tablet by mouth daily.   potassium chloride (KLOR-CON) 10 MEQ tablet Take 1 tablet (10 mEq total) by mouth daily.   sacubitril-valsartan (ENTRESTO) 24-26 MG Take 1 tablet by mouth 2 (two) times daily.   TRUEplus Lancets 28G MISC CHECK BLOOD GLUCOSE TWICE A DAY   No current facility-administered medications for this visit. (Other)   REVIEW OF SYSTEMS: ROS   Positive for: Genitourinary, Endocrine, Cardiovascular, Eyes, Respiratory Negative for: Constitutional, Gastrointestinal, Neurological, Skin, Musculoskeletal, HENT, Psychiatric,  Allergic/Imm, Heme/Lymph Last edited by Matthew Folks, COA on 01/15/2022  9:47 AM.       ALLERGIES Allergies  Allergen Reactions   Adhesive [Tape]     Tears skin, can tolerate paper tape   PAST MEDICAL HISTORY Past Medical History:  Diagnosis Date   Atrial fibrillation with RVR ([place])    Bigeminy 01/2020   Cataract    CHF (congestive heart failure) ([place]) 10/20/2019   CKD (chronic kidney disease), stage IV (HCC)    Diabetes mellitus without complication ([place])    Diabetic retinopathy ([place])    DOE (dyspnea on exertion)    walking upstairs or up hill resolves in one minute   Dysrhythmia    a-fib - on pradaxa   Fibroids    History of kidney stones    History of recent blood transfusion 02/26/2020   Hypertension    Hypertensive retinopathy    Iron deficiency anemia    Non-ischemic cardiomyopathy ([place])    tachycardia induced   Obese    Peripheral edema    Premature ventricular contractions (PVCs) (V[facility])    Sleep apnea    does not use cpap   Stroke ([place]) 54/2706   Umbilical hernia    Wears glasses    Past Surgical History:  Procedure Laterality Date   CATARACT EXTRACTION     CHOLECYSTECTOMY     CYSTOSCOPY W/ URETERAL STENT PLACEMENT Bilateral 05/24/2020   Procedure: CYSTOSCOPY WITH RETROGRADE PYELOGRAM/URETERAL STENT PLACEMENT;  Surgeon: Robley Fries, MD;  Location: [facility];  Service: Urology;  Laterality: Bilateral;   CYSTOSCOPY/RETROGRADE/URETEROSCOPY Bilateral 04/03/2020   Procedure: CYSTOSCOPY/RETROGRADE/URETEROSCOPY;  Surgeon: Ardis Hughs, MD;  Location: [facility];  Service: Urology;  Laterality: Bilateral;   EYE SURGERY Left 08/2021   cataract removed   HYSTERECTOMY ABDOMINAL WITH SALPINGO-OOPHORECTOMY  07/21/2020   Procedure: HYSTERECTOMY ABDOMINAL WITH BILATERAL SALPINGO-OOPHORECTOMY;  Surgeon: Sanjuana Kava, MD;  Location: [place];  Service: Gynecology;;   INJECTION OF SILICONE OIL Left 23/76/2831   Procedure: INJECTION OF SILICONE OIL;  Surgeon: Bernarda Caffey, MD;  Location: [place];  Service: Ophthalmology;  Laterality: Left;   IR FLUORO GUIDE CV LINE RIGHT  02/21/2020   IR US GUIDE VASC ACCESS RIGHT  02/21/2020   MEMBRANE PEEL Left 10/08/2021   Procedure: MEMBRANE PEEL;  Surgeon: Bernarda Caffey, MD;  Location: [place];  Service: Ophthalmology;  Laterality: Left;   NEPHROLITHOTOMY Left 02/14/2020   Procedure: NEPHROLITHOTOMY PERCUTANEOUS/ SURGEON ACCESS/ LEFT PERCUTANEOUS NEPHROSTOMY TUBE PLACEMENT;  Surgeon: Ardis Hughs, MD;  Location: [facility];  Service: Urology;  Laterality: Left;   NEPHROLITHOTOMY Left 02/21/2020   Procedure: NEPHROLITHOTOMY PERCUTANEOUS SECOND LOOK;  Surgeon: Ardis Hughs, MD;  Location: [facility];  Service: Urology;  Laterality: Left;   NEPHROLITHOTOMY Left 02/26/2020   Procedure: NEPHROLITHOTOMY PERCUTANEOUS;  Surgeon: Ceasar Mons, MD;  Location: [facility];  Service: Urology;  Laterality: Left;  NEED 150 MIN   NEPHROLITHOTOMY Right 03/24/2020   Procedure: NEPHROLITHOTOMY PERCUTANEOUS WITH ACCESS LEFT STENT REMOVAL;  Surgeon: Ardis Hughs, MD;  Location: [facility];  Service: Urology;  Laterality: Right;   PARS PLANA VITRECTOMY Left 10/08/2021   Procedure: PARS PLANA VITRECTOMY WITH 25 GAUGE;  Surgeon: Bernarda Caffey, MD;  Location: [place];  Service: Ophthalmology;  Laterality: Left;   PHOTOCOAGULATION WITH LASER Left 10/08/2021   Procedure: PHOTOCOAGULATION WITH LASER;  Surgeon: Bernarda Caffey, MD;  Location: [place];  Service: Ophthalmology;  Laterality: Left;   RIGHT HEART CATH N/A 11/09/2019   Procedure: RIGHT HEART CATH;  Surgeon: Jolaine Artist, MD;  Location: [place] [facility];  Service: Cardiovascular;  Laterality: N/A;   WISDOM TOOTH EXTRACTION     FAMILY HISTORY Family History  Problem Relation Age of Onset   Atrial fibrillation Mother    Hypertension Mother    Stroke Mother 85   Diabetes Mother    Diabetes Father    Hypertension Father    Diabetes Sister    Hypertension Sister     Diabetes Brother    Hypertension Brother    Diabetes Brother    Heart attack Brother    Stroke Maternal Grandmother 51   SOCIAL HISTORY Social History   Tobacco Use   Smoking status: Never   Smokeless tobacco: Never  Vaping Use   Vaping Use: Never used  Substance Use Topics   Alcohol use: Never   Drug use: Never       OPHTHALMIC EXAM:  Base Eye Exam     Visual Acuity (Snellen - Linear)       Right Left   Dist cc 20/25 -2 CF @ 3'   Dist ph cc NI NI    Correction: Glasses         Tonometry (Tonopen, 9:52 AM)       Right Left   Pressure 12 18         Pupils       Dark Light Shape React APD   Right 3 2 Round Brisk None   Left 4 3 Round Brisk None         Visual Fields (Counting fingers)       Left Right     Full   Restrictions Total superior nasal, inferior nasal deficiencies; Partial outer superior temporal, inferior temporal deficiencies          Extraocular Movement       Right Left    Full, Ortho Full, Ortho         Neuro/Psych     Oriented x3: Yes   Mood/Affect: Normal         Dilation     Both eyes: 1.0% Mydriacyl, 2.5% Phenylephrine @ 9:52 AM           Slit Lamp and Fundus Exam     External Exam       Right Left   External Normal Normal         Slit Lamp Exam       Right Left   Lids/Lashes Dermatochalasis - upper lid Dermatochalasis - upper lid   Conjunctiva/Sclera Melanosis sutures dissolved, mild melanosis   Cornea mild arcus, trace PEE mild arcus, well healed cataract wound, 1+ Punctate epithelial erosions   Anterior Chamber Deep and clear Deep and quiet, 1-2+ cell and pigment   Iris Round and dilated, No NVI Round and dilated, No NVI   Lens  1-2+ Nuclear sclerosis, 1-2+ Cortical cataract, fine pigment deposition on posterior capsule PC IOL in good position, pigment deposition on optic, 1-2+PCO   Anterior Vitreous Vitreous syneresis, fine RBC, blood stained vitreous condensations clearing and settling  inferiorly--turning white post vitrectomy, good oil fill, +pigment         Fundus Exam       Right Left   Disc Pink and Sharp, +fibrosis, +NVD - regressing, mild PPP, Compact 1+pallor, sharp nasal rim, +temporal edema - improved   C/D Ratio 0.2 0.2   Macula Flat, Blunted foveal reflex, scattered MA, RPE mottling Flat under oil, fibrosis improved, scattered IRH/DBH, residual fibrosis and thickening nasal macula -- persistent   Vessels attenuated, Tortuous, peripheral sclerosis Severe attenuation / sclerosis, +fibrosis improved, tortuousity   Periphery Attached, scattered patches of fibrosis and NVE, small flap tear at 0130 equator--no SRF, good laser surrounding, old VH settled inferiorly - improving; PRP laser changes Attached, Scattered DBH; good PRP changes 360; scattered fibrosis            IMAGING AND PROCEDURES  Imaging and Procedures for 01/15/2022  OCT, Retina - OU - Both Eyes       Right Eye Quality was good. Central Foveal Thickness: 212. Progression has improved. Findings include normal foveal contour, no IRF, no SRF, intraretinal hyper-reflective material (Interval improvement in vitreous opacities. Persistent fibrosis of nasal disc.).   Left Eye Quality was good. Central Foveal Thickness: 542. Progression has worsened. Findings include abnormal foveal contour, intraretinal fluid, no SRF, outer retinal atrophy, inner retinal atrophy, macular pucker (Retina attached under oil; diffuse atrophy; mild interval increase in IRF nasal macula).   Notes *Images captured and stored on drive  Diagnosis / Impression:  OD: Interval improvement in vitreous opacities.  Persistent fibrosis of nasal disc. OS: Retina attached under oil; diffuse atrophy; mild interval increase in IRF nasal macula  Clinical management:  See below  Abbreviations: NFP - Normal foveal profile. CME - cystoid macular edema. PED - pigment epithelial detachment. IRF - intraretinal fluid. SRF - subretinal  fluid. EZ - ellipsoid zone. ERM - epiretinal membrane. ORA - outer retinal atrophy. ORT - outer retinal tubulation. SRHM - subretinal hyper-reflective material. IRHM - intraretinal hyper-reflective material      Intravitreal Injection, Pharmacologic Agent - OD - Right Eye       Time Out 01/15/2022. 10:37 AM. Confirmed correct patient, procedure, site, and patient consented.   Anesthesia Topical anesthesia was used. Anesthetic medications included Lidocaine 2%, Lidocaine 4%, Proparacaine 0.5%.   Procedure Preparation included 5% betadine to ocular surface, eyelid speculum. A supplied needle was used.   Injection: 1.25 mg Bevacizumab 1.25mg /0.56ml   Route: Intravitreal, Site: Right Eye   NDC: H061816, Lot: [id number] , Expiration date: 03/03/2022, Waste: 0 mL   Post-op Post injection exam found visual acuity of at least counting fingers. The patient tolerated the procedure well. There were no complications. The patient received written and verbal post procedure care education. Post injection medications were not given.      Intravitreal Injection, Pharmacologic Agent - OS - Left Eye       Time Out 01/15/2022. 10:44 AM. Confirmed correct patient, procedure, site, and patient consented.   Anesthesia Topical anesthesia was used. Anesthetic medications included Lidocaine 2%, Proparacaine 0.5%.   Procedure Preparation included 5% betadine to ocular surface, eyelid speculum.   Injection: 1.25 mg Bevacizumab 1.25mg /0.33ml   Route: Intravitreal, Site: Left Eye   NDC: 80m, Lot: [id number], Expiration date: 03/01/2022, Waste: 0.05 mL  Post-op Post injection exam found visual acuity of at least counting fingers, no retinal detachment. The patient tolerated the procedure well. There were no complications. The patient received written and verbal post procedure care education. Post injection medications were not given.              ASSESSMENT/PLAN:    ICD-10-CM   1.  Vitreous hemorrhage of right eye (HCC)  H43.11 OCT, Retina - OU - Both Eyes    Intravitreal Injection, Pharmacologic Agent - OD - Right Eye    Bevacizumab (AVASTIN) SOLN 1.25 mg    2. 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 Vitreous hemorrhage of left eye (HCC)  H43.12     4. Traction detachment of left retina  H33.42     5. Retinal tear of right eye  H33.311     6. Essential hypertension  I10     7. Hypertensive retinopathy of both eyes  H35.033     8. Combined forms of age-related cataract of right eye  H25.811     9. Pseudophakia  Z96.1      VH OD - New floaters noticed on 12.20.22 - exam shows blood stained vit condensations without RT/RD - likely related to history of PDR and HTN  - s/p IVA OD #1 (12.23.22)  - interval improvement in VH -- clearing and settling infierorly -- residual VH turning white - recommend IVA OD #2 today, 01.27.23  - pt in agreement  - RBA of procedure discussed, questions answered - IVA informed consent obtained and signed, 12.23.22 (OD) - see procedure note - f/u 4 wks w/DFE/OCT/likely IVA OD  2-4. Proliferative diabetic retinopathy OU  - s/p PRP OD (09.19.22)             - OD: w/ +NVD  - OS: with chronic, diffuse VH and underlying TRD - exam shows +NVD and scattered fibrosis OD,              - s/p PPV/MP/EL/FAX/silicone oil (7106 cs) OS 10.20.22             - intra-op -- retina beneath chronic, dense VH was severely ischemic, florid fibrosis w/ +360 posterior TRD w/ stretch holes             - good oil fill, retina attached under oil; fibrosis improved, and good laser changes in place  - BCVA remains CF OS             -  IOP good at 12, 18  - OCT shows retina attached under oil; diffuse atrophy; mild interval increase in IRF nasal macula              - cont PF QID OS    Ketorolac QID OS             - avoid laying flat on back              - post op drop and positioning instructions reviewed             - Recommend IVA OS #1 today, 1.27.23 for persistent DME             -  Risks and benefits of tx discussed w/pt             - Pt desires to proceed w/tx today             - See procedure note             - F/u 4 wks w/DFE/OCT/poss. IVA OS  5. Retinal tear, OD - small flap tear located at 0130 equator, no SRF - lasered with PRP OD as above on 9.19.22  6,7. Hypertensive retinopathy OU - discussed importance of tight BP control - monitor   8. Mixed Cataract OD - The symptoms of cataract, surgical options, and treatments and risks were discussed with patient.  - discussed diagnosis and progression - under the expert management of Dr. Wyatt Portela  9. Pseudophakia OS  - s/p CE/IOL (Dr. Katy Fitch)  - IOL in good position, doing well  - monitor   Ophthalmic Meds Ordered this visit:  Meds ordered this encounter  Medications   Bevacizumab (AVASTIN) SOLN 1.25 mg   Bevacizumab (AVASTIN) SOLN 1.25 mg      Return in about 4 weeks (around 02/12/2022) for PDR OU w/DFE/OCT/likely IVA OU.  There are no Patient Instructions on file for this visit.  This document serves as a record of services personally performed by Gardiner Sleeper, MD, PhD. It was created on their behalf by Leonie Douglas, an ophthalmic technician. The creation of this record is the provider's dictation and/or activities during the visit.    Electronically signed by: Leonie Douglas COA, 01/15/22  2:05 PM  This document serves as a record of services personally performed by Gardiner Sleeper, MD, PhD. It was created on their behalf by San Jetty. Owens Shark, OA an ophthalmic technician. The creation of this record is the provider's dictation and/or activities  during the visit.    Electronically signed by: San Jetty. Owens Shark, [place] 01.27.2023 2:05 PM  This document serves as a record of services personally performed by Gardiner Sleeper, MD, PhD. It was created on their behalf by Estill Bakes, COT an ophthalmic technician. The creation of this record is the provider's dictation and/or activities during the visit.    Electronically signed by: Estill Bakes, COT 1.27.23 @ 2:05 PM   Gardiner Sleeper, M.D., Ph.D. Diseases & Surgery of the Retina and [place] 01/15/2022  I have reviewed the above documentation for accuracy and completeness, and I agree with the above. Gardiner Sleeper, M.D., Ph.D. 01/15/22 2:05 PM   Abbreviations: M myopia (nearsighted); A astigmatism; H hyperopia (farsighted); P presbyopia; Mrx spectacle prescription;  CTL contact lenses; OD right eye; OS left eye; OU both eyes  XT exotropia; ET esotropia; PEK punctate epithelial keratitis; PEE punctate epithelial erosions; DES dry eye syndrome; MGD meibomian gland dysfunction; ATs artificial tears; PFAT's preservative free artificial tears; [place] nuclear sclerotic cataract; PSC posterior subcapsular cataract; ERM epi-retinal membrane; PVD posterior vitreous detachment; RD retinal detachment; DM diabetes mellitus; DR diabetic retinopathy; NPDR non-proliferative diabetic retinopathy; PDR proliferative diabetic retinopathy; CSME clinically significant macular edema; DME diabetic macular edema; dbh dot blot hemorrhages; CWS cotton wool spot; POAG primary open angle glaucoma; C/D cup-to-disc ratio; HVF humphrey visual field; GVF goldmann visual field; OCT optical coherence tomography; IOP intraocular pressure; BRVO Branch retinal vein occlusion; CRVO central retinal vein occlusion; CRAO central retinal artery occlusion; BRAO branch retinal artery occlusion; RT retinal tear; SB scleral buckle; PPV pars plana vitrectomy; VH Vitreous hemorrhage; PRP panretinal laser  photocoagulation;  IVK intravitreal kenalog; VMT vitreomacular traction; MH Macular hole;  NVD neovascularization of the disc; NVE neovascularization elsewhere; AREDS age related eye disease study; ARMD age related macular degeneration; POAG primary open angle glaucoma; EBMD epithelial/anterior basement membrane dystrophy; ACIOL anterior chamber intraocular lens; IOL intraocular lens; PCIOL posterior chamber intraocular lens; Phaco/IOL phacoemulsification with intraocular lens placement; [place] photorefractive keratectomy; LASIK laser assisted in situ keratomileusis; HTN hypertension; DM diabetes mellitus; COPD chronic obstructive pulmonary disease

## 2022-01-15 ENCOUNTER — Other Ambulatory Visit: Payer: Self-pay

## 2022-01-15 ENCOUNTER — Ambulatory Visit (INDEPENDENT_AMBULATORY_CARE_PROVIDER_SITE_OTHER): Admitting: Ophthalmology

## 2022-01-15 ENCOUNTER — Other Ambulatory Visit (HOSPITAL_BASED_OUTPATIENT_CLINIC_OR_DEPARTMENT_OTHER): Payer: Self-pay

## 2022-01-15 ENCOUNTER — Other Ambulatory Visit (HOSPITAL_COMMUNITY): Payer: Self-pay | Admitting: *Deleted

## 2022-01-15 ENCOUNTER — Encounter (INDEPENDENT_AMBULATORY_CARE_PROVIDER_SITE_OTHER): Payer: Self-pay | Admitting: Ophthalmology

## 2022-01-15 ENCOUNTER — Other Ambulatory Visit (HOSPITAL_COMMUNITY): Payer: Self-pay

## 2022-01-15 DIAGNOSIS — H4311 Vitreous hemorrhage, right eye: Secondary | ICD-10-CM | POA: Diagnosis not present

## 2022-01-15 DIAGNOSIS — E113593 Type 2 diabetes mellitus with proliferative diabetic retinopathy without macular edema, bilateral: Secondary | ICD-10-CM | POA: Diagnosis not present

## 2022-01-15 DIAGNOSIS — H4312 Vitreous hemorrhage, left eye: Secondary | ICD-10-CM | POA: Diagnosis not present

## 2022-01-15 DIAGNOSIS — I10 Essential (primary) hypertension: Secondary | ICD-10-CM

## 2022-01-15 DIAGNOSIS — H3342 Traction detachment of retina, left eye: Secondary | ICD-10-CM | POA: Diagnosis not present

## 2022-01-15 DIAGNOSIS — H35033 Hypertensive retinopathy, bilateral: Secondary | ICD-10-CM

## 2022-01-15 DIAGNOSIS — H33311 Horseshoe tear of retina without detachment, right eye: Secondary | ICD-10-CM

## 2022-01-15 DIAGNOSIS — H25811 Combined forms of age-related cataract, right eye: Secondary | ICD-10-CM

## 2022-01-15 DIAGNOSIS — Z961 Presence of intraocular lens: Secondary | ICD-10-CM

## 2022-01-15 MED ORDER — ATORVASTATIN CALCIUM 80 MG PO TABS
80.0000 mg | ORAL_TABLET | Freq: Every day | ORAL | 3 refills | Status: DC
  Filled 2022-01-15: qty 30, 30d supply, fill #0
  Filled 2022-03-11: qty 30, 30d supply, fill #1

## 2022-01-15 MED ORDER — BEVACIZUMAB CHEMO INJECTION 1.25MG/0.05ML SYRINGE FOR KALEIDOSCOPE
1.2500 mg | INTRAVITREAL | Status: AC | PRN
  Administered 2022-01-15: 1.25 mg via INTRAVITREAL

## 2022-01-24 ENCOUNTER — Encounter (HOSPITAL_COMMUNITY): Payer: Self-pay | Admitting: Emergency Medicine

## 2022-01-24 ENCOUNTER — Emergency Department (HOSPITAL_COMMUNITY): Payer: Self-pay

## 2022-01-24 ENCOUNTER — Other Ambulatory Visit: Payer: Self-pay

## 2022-01-24 ENCOUNTER — Emergency Department (HOSPITAL_COMMUNITY)
Admission: EM | Admit: 2022-01-24 | Discharge: 2022-01-25 | Disposition: A | Discharge status: Home / Self Care | Payer: Self-pay | Attending: Emergency Medicine | Admitting: Emergency Medicine

## 2022-01-24 DIAGNOSIS — M2392 Unspecified internal derangement of left knee: Secondary | ICD-10-CM | POA: Insufficient documentation

## 2022-01-24 DIAGNOSIS — Z7901 Long term (current) use of anticoagulants: Secondary | ICD-10-CM | POA: Insufficient documentation

## 2022-01-24 DIAGNOSIS — W010XXA Fall on same level from slipping, tripping and stumbling without subsequent striking against object, initial encounter: Secondary | ICD-10-CM | POA: Insufficient documentation

## 2022-01-24 DIAGNOSIS — E876 Hypokalemia: Secondary | ICD-10-CM

## 2022-01-24 DIAGNOSIS — N1832 Chronic kidney disease, stage 3b: Secondary | ICD-10-CM

## 2022-01-24 DIAGNOSIS — Z20822 Contact with and (suspected) exposure to covid-19: Secondary | ICD-10-CM | POA: Insufficient documentation

## 2022-01-24 DIAGNOSIS — I129 Hypertensive chronic kidney disease with stage 1 through stage 4 chronic kidney disease, or unspecified chronic kidney disease: Secondary | ICD-10-CM | POA: Insufficient documentation

## 2022-01-24 DIAGNOSIS — R531 Weakness: Secondary | ICD-10-CM | POA: Insufficient documentation

## 2022-01-24 DIAGNOSIS — M25062 Hemarthrosis, left knee: Secondary | ICD-10-CM

## 2022-01-24 DIAGNOSIS — W19XXXA Unspecified fall, initial encounter: Secondary | ICD-10-CM

## 2022-01-24 DIAGNOSIS — S8002XA Contusion of left knee, initial encounter: Secondary | ICD-10-CM

## 2022-01-24 DIAGNOSIS — I10 Essential (primary) hypertension: Secondary | ICD-10-CM

## 2022-01-24 DIAGNOSIS — N189 Chronic kidney disease, unspecified: Secondary | ICD-10-CM | POA: Insufficient documentation

## 2022-01-24 DIAGNOSIS — Z79899 Other long term (current) drug therapy: Secondary | ICD-10-CM | POA: Insufficient documentation

## 2022-01-24 DIAGNOSIS — L609 Nail disorder, unspecified: Secondary | ICD-10-CM | POA: Insufficient documentation

## 2022-01-24 DIAGNOSIS — Z794 Long term (current) use of insulin: Secondary | ICD-10-CM | POA: Insufficient documentation

## 2022-01-24 DIAGNOSIS — E1122 Type 2 diabetes mellitus with diabetic chronic kidney disease: Secondary | ICD-10-CM | POA: Insufficient documentation

## 2022-01-24 LAB — CBC WITH DIFFERENTIAL/PLATELET
Abs Immature Granulocytes: 0.04 10*3/uL (ref 0.00–0.07)
Basophils Absolute: 0 10*3/uL (ref 0.0–0.1)
Basophils Relative: 0 %
Eosinophils Absolute: 0.1 10*3/uL (ref 0.0–0.5)
Eosinophils Relative: 1 %
HCT: 39 % (ref 36.0–46.0)
Hemoglobin: 12.6 g/dL (ref 12.0–15.0)
Immature Granulocytes: 0 %
Lymphocytes Relative: 9 %
Lymphs Abs: 0.9 10*3/uL (ref 0.7–4.0)
MCH: 28.7 pg (ref 26.0–34.0)
MCHC: 32.3 g/dL (ref 30.0–36.0)
MCV: 88.8 fL (ref 80.0–100.0)
Monocytes Absolute: 0.5 10*3/uL (ref 0.1–1.0)
Monocytes Relative: 5 %
Neutro Abs: 8.2 10*3/uL — ABNORMAL HIGH (ref 1.7–7.7)
Neutrophils Relative %: 85 %
Platelets: 163 10*3/uL (ref 150–400)
RBC: 4.39 MIL/uL (ref 3.87–5.11)
RDW: 13.2 % (ref 11.5–15.5)
WBC: 9.7 10*3/uL (ref 4.0–10.5)
nRBC: 0 % (ref 0.0–0.2)

## 2022-01-24 LAB — URINALYSIS, ROUTINE W REFLEX MICROSCOPIC
Bilirubin Urine: NEGATIVE
Glucose, UA: >=500 mg/dL — AB
Ketones, ur: NEGATIVE mg/dL
Leukocytes,Ua: NEGATIVE
Nitrite: NEGATIVE
Protein, ur: >300 mg/dL — AB
Specific Gravity, Urine: 1.015 (ref 1.005–1.030)
pH: 7.5 (ref 5.0–8.0)

## 2022-01-24 LAB — BASIC METABOLIC PANEL
Anion gap: 11 (ref 5–15)
BUN: 32 mg/dL — ABNORMAL HIGH (ref 6–20)
CO2: 22 mmol/L (ref 22–32)
Calcium: 9.1 mg/dL (ref 8.9–10.3)
Chloride: 111 mmol/L (ref 98–111)
Creatinine, Ser: 1.9 mg/dL — ABNORMAL HIGH (ref 0.44–1.00)
GFR, Estimated: 31 mL/min — ABNORMAL LOW (ref >60)
Glucose, Bld: 240 mg/dL — ABNORMAL HIGH (ref 70–99)
Potassium: 3.2 mmol/L — ABNORMAL LOW (ref 3.5–5.1)
Sodium: 144 mmol/L (ref 135–145)

## 2022-01-24 LAB — URINALYSIS, MICROSCOPIC (REFLEX)

## 2022-01-24 LAB — RESP PANEL BY RT-PCR (FLU A&B, COVID) ARPGX2
Influenza A by PCR: NEGATIVE
Influenza B by PCR: NEGATIVE
SARS Coronavirus 2 by RT PCR: NEGATIVE

## 2022-01-24 MED ORDER — HYDROCODONE-ACETAMINOPHEN 5-325 MG PO TABS
1.0000 | ORAL_TABLET | Freq: Once | ORAL | Status: AC
  Administered 2022-01-24: 1 via ORAL
  Filled 2022-01-24: qty 1

## 2022-01-24 MED ORDER — POTASSIUM CHLORIDE CRYS ER 20 MEQ PO TBCR
40.0000 meq | EXTENDED_RELEASE_TABLET | Freq: Once | ORAL | Status: AC
  Administered 2022-01-24: 40 meq via ORAL
  Filled 2022-01-24: qty 2

## 2022-01-24 MED ORDER — HYDROCODONE-ACETAMINOPHEN 5-325 MG PO TABS
1.0000 | ORAL_TABLET | ORAL | 0 refills | Status: DC | PRN
  Filled 2022-01-24: qty 10, 2d supply, fill #0

## 2022-01-24 MED ORDER — IBUPROFEN 400 MG PO TABS
600.0000 mg | ORAL_TABLET | Freq: Once | ORAL | Status: DC
  Filled 2022-01-24: qty 1

## 2022-01-24 NOTE — ED Notes (Signed)
Pt ambulated with assistive device. Pt was unable to take a step due to the pain. Pt sat back on the bed.

## 2022-01-24 NOTE — Progress Notes (Signed)
Orthopedic Tech Progress Note Patient Details:  Adrienne Lane 1966/03/22 [id number]  Ortho Devices Type of Ortho Device: Knee Immobilizer Ortho Device/Splint Location: lle Ortho Device/Splint Interventions: Ordered, Application, Adjustment   Post Interventions Patient Tolerated: Well Instructions Provided: Care of device, Adjustment of device  Karolee Stamps 01/24/2022, 10:27 PM

## 2022-01-24 NOTE — ED Triage Notes (Signed)
Patient coming from home c/o left knee pain. Reports tripping over her own foot and causing her to fall around noon today. History of stroke with left sided weakness deficits.

## 2022-01-24 NOTE — ED Provider Notes (Signed)
[facility] EMERGENCY DEPARTMENT Provider Note   CSN: [id number] Arrival date & time: 01/24/22  1900     History  Chief Complaint  Patient presents with   Lytle Michaels    Adrienne Lane is a 56 y.o. female.  Pt is a 56 yo bf with a hx of htn, high cholesterol, dm, cva, fibroids, afib with rvr (on pradaxa), and ckd.  CHA2DS2/VAS Stroke Risk Points  Current as of 3 minutes ago     7 >= 2 Points: High Risk  1 - 1.99 Points: Medium Risk  0 Points: Low Risk    Last Change: N/A      Details    This score determines the patient's risk of having a stroke if the  patient has atrial fibrillation.       Points Metrics  1 Has Congestive Heart Failure:  Yes    Current as of 3 minutes ago  1 Has Vascular Disease:  Yes    Current as of 3 minutes ago  1 Has Hypertension:  Yes    Current as of 3 minutes ago  0 Age:  71    Current as of 3 minutes ago  1 Has Diabetes:  Yes    Current as of 3 minutes ago  2 Had Stroke:  Yes  Had TIA:  No  Had Thromboembolism:  No    Current as of 3 minutes ago  1 Female:  Yes    Current as of 3 minutes ago       Pt said she tripped and landed on her left knee.  She has chronic weakness in her left leg after a stroke in in Jan of 2022.  She thinks her legs became tangled up which made her fall.  She has been unable to put any weight on her knee.  She also has some left foot pain. Pt does have a low grade fever here.  She was unaware of the fever.  She denies any dysuria, sob, cough.  She has not taken anything for pain.         Home Medications Prior to Admission medications   Medication Sig Start Date End Date Taking? Authorizing Provider  HYDROcodone-acetaminophen (NORCO/VICODIN) 5-325 MG tablet Take 1 tablet by mouth every 4 (four) hours as needed. 01/24/22  Yes Isla Pence, MD  acetaminophen (TYLENOL) 500 MG tablet Take 500 mg by mouth every 6 (six) hours as needed for moderate pain or headache.    [provider]   amLODipine (NORVASC) 10 MG tablet Take 1 tablet (10 mg total) by mouth daily. 09/29/21   Bensimhon, Shaune Pascal, MD  atorvastatin (LIPITOR) 80 MG tablet Take 1 tablet (80 mg total) by mouth daily. 01/15/22   Bensimhon, Shaune Pascal, MD  Bromfenac Sodium (PROLENSA) 0.07 % SOLN Place 1 drop into the left eye 4 (four) times daily. 10/30/21   Bernarda Caffey, MD  carvedilol (COREG) 6.25 MG tablet Take 1 tablet (6.25 mg total) by mouth 2 (two) times daily with a meal. 09/29/21   Bensimhon, Shaune Pascal, MD  cholecalciferol (VITAMIN D3) 25 MCG (1000 UNIT) tablet Take 1,000 Units by mouth daily.    [provider]  dabigatran (PRADAXA) 150 MG CAPS capsule TAKE 1 CAPSULE (150 MG TOTAL) BY MOUTH EVERY 12 HOURS. 02/06/21 02/06/22  Bensimhon, Shaune Pascal, MD  dapagliflozin propanediol (FARXIGA) 10 MG TABS tablet Take 1 tablet (10 mg total) by mouth daily before breakfast. 09/29/21   Bensimhon, Shaune Pascal, MD  glucose  blood (TRUE METRIX BLOOD GLUCOSE TEST) test strip Use as instructed 04/01/21   Charlott Rakes, MD  hydrALAZINE (APRESOLINE) 100 MG tablet Take 1 tablet (100 mg total) by mouth 3 (three) times daily. 09/29/21   Bensimhon, Shaune Pascal, MD  insulin aspart (NOVOLOG) 100 UNIT/ML injection Use as directed sliding scale insulin TID. Patient taking differently: Inject 2-5 Units into the skin daily. Use as directed sliding scale insulin TID. 05/22/21   Nicolette Bang, MD  insulin glargine (LANTUS) 100 UNIT/ML injection inject 22 by subcutaneous route as per insulin protocol for 30 days 12/28/21     Insulin Syringe-Needle U-100 (TRUEPLUS INSULIN SYRINGE) 30G X 5/16" 0.3 ML MISC USE AS DIRECTED 3 TIMES DAILY. 12/28/21 12/28/22  Dorna Mai, MD  Insulin Syringe-Needle U-100 30G X 5/16" 0.5 ML MISC Use as directed. 04/09/21   Argentina Donovan, PA-C  isosorbide mononitrate (IMDUR) 60 MG 24 hr tablet Take 1 tablet (60 mg total) by mouth daily. 09/29/21   Bensimhon, Shaune Pascal, MD  ketorolac (ACULAR) 0.5 % ophthalmic  solution Place 1 drop into the left eye 4 (four) times daily. 11/09/21 11/09/22  Bernarda Caffey, MD  Lancets [facility] ULTRASOFT) lancets Check CBG twice a day 01/21/21   Nicolette Bang, MD  mexiletine (MEXITIL) 150 MG capsule TAKE 2 CAPSULES (300 MG TOTAL) BY MOUTH EVERY 12 (TWELVE) HOURS. 09/15/21 09/15/22  Bensimhon, Shaune Pascal, MD  Multiple Vitamins-Minerals (WOMENS MULTI PO) Take 1 tablet by mouth daily.    [provider]  potassium chloride (KLOR-CON) 10 MEQ tablet Take 1 tablet (10 mEq total) by mouth daily. 09/29/21   Bensimhon, Shaune Pascal, MD  prednisoLONE acetate (PRED FORTE) 1 % ophthalmic suspension Place 1 drop into the left eye 4 (four) times daily. 10/30/21   Bernarda Caffey, MD  sacubitril-valsartan (ENTRESTO) 24-26 MG Take 1 tablet by mouth 2 (two) times daily. 04/22/21   Bensimhon, Shaune Pascal, MD  ELIQUIS 5 MG TABS tablet TAKE 1 TABLET (5 MG TOTAL) BY MOUTH 2 (TWO) TIMES DAILY. 12/24/20 12/27/20  Bensimhon, Shaune Pascal, MD  potassium chloride (KLOR-CON) 10 MEQ tablet Take 1 tablet (10 mEq total) by mouth daily. 02/06/21 05/22/21  Larey Dresser, MD  torsemide (DEMADEX) 20 MG tablet TAKE 2 TABLETS (40 MG TOTAL) BY MOUTH 2 (TWO) TIMES DAILY. 11/24/20 12/27/20  Conrad [place], NP      Allergies    Adhesive [tape]    Review of Systems   Review of Systems  Constitutional:  Positive for fever.  Musculoskeletal:        Left knee and left foot pain  All other systems reviewed and are negative.  Physical Exam Updated Vital Signs BP (!) 173/60    Pulse (!) 55    Temp 98.9 F (37.2 C) (Oral)    Resp (!) 25    LMP 05/12/2020 (Exact Date)    SpO2 97%  Physical Exam Vitals and nursing note reviewed.  Constitutional:      Appearance: Normal appearance.  HENT:     Head: Normocephalic and atraumatic.     Right Ear: External ear normal.     Left Ear: External ear normal.     Nose: Nose normal.     Mouth/Throat:     Mouth: Mucous membranes are moist.     Pharynx: Oropharynx is clear.   Eyes:     Extraocular Movements: Extraocular movements intact.     Conjunctiva/sclera: Conjunctivae normal.     Pupils: Pupils are equal, round, and reactive to  light.  Cardiovascular:     Rate and Rhythm: Normal rate and regular rhythm.     Pulses: Normal pulses.     Heart sounds: Normal heart sounds.  Pulmonary:     Effort: Pulmonary effort is normal.     Breath sounds: Normal breath sounds.  Abdominal:     General: Abdomen is flat. Bowel sounds are normal.     Palpations: Abdomen is soft.  Musculoskeletal:     Cervical back: Normal range of motion and neck supple.     Left knee: Bony tenderness present. Decreased range of motion.       Legs:  Skin:    General: Skin is warm.     Capillary Refill: Capillary refill takes less than 2 seconds.  Neurological:     Mental Status: She is alert and oriented to person, place, and time.     Comments: Weakness to left leg and arm  Psychiatric:        Mood and Affect: Mood normal.        Behavior: Behavior normal.    ED Results / Procedures / Treatments   Labs (all labs ordered are listed, but only abnormal results are displayed) Labs Reviewed  URINALYSIS, ROUTINE W REFLEX MICROSCOPIC - Abnormal; Notable for the following components:      Result Value   Glucose, UA >=500 (*)    Hgb urine dipstick SMALL (*)    Protein, ur >300 (*)    All other components within normal limits  BASIC METABOLIC PANEL - Abnormal; Notable for the following components:   Potassium 3.2 (*)    Glucose, Bld 240 (*)    BUN 32 (*)    Creatinine, Ser 1.90 (*)    GFR, Estimated 31 (*)    All other components within normal limits  CBC WITH DIFFERENTIAL/PLATELET - Abnormal; Notable for the following components:   Neutro Abs 8.2 (*)    All other components within normal limits  URINALYSIS, MICROSCOPIC (REFLEX) - Abnormal; Notable for the following components:   Bacteria, UA RARE (*)    All other components within normal limits  RESP PANEL BY RT-PCR (FLU  A&B, COVID) [id number]    EKG None  Radiology DG Chest 2 View  Result Date: 01/24/2022 CLINICAL DATA:  Fever EXAM: CHEST - 2 VIEW COMPARISON:  01/20/2021 FINDINGS: The heart size and mediastinal contours are within normal limits. Both lungs are clear. The visualized skeletal structures are unremarkable. IMPRESSION: No active cardiopulmonary disease. Electronically Signed   By: Fidela Salisbury M.D.   On: 01/24/2022 21:10   DG Knee Complete 4 Views Left  Result Date: 01/24/2022 CLINICAL DATA:  Left knee pain.  Fell today. EXAM: LEFT KNEE - COMPLETE 4+ VIEW COMPARISON:  None. FINDINGS: No evidence of fracture. Medial compartment joint space narrowing consistent with osteoarthritis. Some arthritic change also of the patellofemoral joint. No visible joint effusion. IMPRESSION: No acute finding. Medial compartment and patellofemoral compartment osteoarthritis. Electronically Signed   By: Nelson Chimes M.D.   On: 01/24/2022 21:12   DG Foot Complete Left  Result Date: 01/24/2022 CLINICAL DATA:  Fall, left great toe pain EXAM: LEFT FOOT - COMPLETE 3+ VIEW COMPARISON:  None. FINDINGS: The nail of the left great toe appears sclerotic and misshapen, chronic deformity versus is posttraumatic. Osseous structures appear mildly diffusely osteopenic. Normal alignment. No acute fracture or dislocation. Moderate plantar calcaneal spur. Vascular calcifications are noted. IMPRESSION: Sclerotic, misshapen nail of the great toe, chronic versus posttraumatic. No acute fracture or  dislocation. Electronically Signed   By: Fidela Salisbury M.D.   On: 01/24/2022 21:13    Procedures Procedures    Medications Ordered in ED Medications  ibuprofen (ADVIL) tablet 600 mg (600 mg Oral Patient Refused/Not Given 01/24/22 2014)  potassium chloride SA (KLOR-CON M) CR tablet 40 mEq (has no administration in time range)  HYDROcodone-acetaminophen (NORCO/VICODIN) 5-325 MG per tablet 1 tablet (1 tablet Oral Given 01/24/22 1955)    ED Course/  Medical Decision Making/ A&P                           Medical Decision Making Amount and/or Complexity of Data Reviewed Labs: ordered. Radiology: ordered.  Risk Prescription drug management.   This patient presents to the ED for concern of fracture of knee and toe.  Pt also has a low grade fever.  This involves an extensive number of treatment options, and is a complaint that carries with it a high risk of complications and morbidity.  The differential diagnosis includes fracture or sprain and infection.   Co morbidities that complicate the patient evaluation  DM, htn, high cholesterol,cva, fibroids, afib with rvr (on pradaxa), and ckd.   Additional history obtained:  Additional history obtained from epic External records from outside source obtained and reviewed including family   Lab Tests:  I Ordered, and personally interpreted labs.  The pertinent results include:  Covid/flu neg.  CBC nl.  BMP with ckd and mild hypokalemia.  BS is elevated at 240.  Pt is a diabetic.   Imaging Studies ordered:  I ordered imaging studies including knee, chest, and foot xrays  I independently visualized and interpreted imaging which showed nothing acute I agree with the radiologist interpretation   Cardiac Monitoring:  The patient was maintained on a cardiac monitor.  I personally viewed and interpreted the cardiac monitored which showed an underlying rhythm of: nsr   Medicines ordered and prescription drug management:  I ordered medication including lortab  for pain and fever.  Pt unable to take nsaids.  Reevaluation of the patient after these medicines showed that the patient improved I have reviewed the patients home medicines and have made adjustments as needed   Test Considered:  No need for CT scans.  Pt is able to ambulate.   Critical Interventions:  Pain medications.      Problem List / ED Course:  Knee pain after fall:  sprain.  Knee immobilizer applied.  Pt  able to ambulate.  She will need to f/u with ortho. Toe pain after fall:  contusion.  No fx.  Pt's nails are onychomycotic and curling inward.  She is told to f/u with podiatry. Fever.  No source.  Pt looks non toxic.  Likely viral. Hypokalemia:  pt given 40 meq kdur and encouraged to eat more foods higher in K. Hyperglycemia.  Pt is to take bs meds when she gets home.   Reevaluation:  After the interventions noted above, I reevaluated the patient and found that they have :improved   Social Determinants of Health:  Pt going home with her husband.  She has good follow up.   Dispostion:  After consideration of the diagnostic results and the patients response to treatment, I feel that the patent would benefit from discharge with outpatient f/u.Marland Kitchen          Final Clinical Impression(s) / ED Diagnoses Final diagnoses:  Fall, initial encounter  Internal derangement of left knee  Nail abnormalities  Rx / DC Orders ED Discharge Orders          Ordered    HYDROcodone-acetaminophen (NORCO/VICODIN) 5-325 MG tablet  Every 4 hours PRN        01/24/22 2236    For home use only DME 4 wheeled rolling walker with seat        01/24/22 2239              Isla Pence, MD 01/24/22 2239

## 2022-01-25 ENCOUNTER — Emergency Department (HOSPITAL_COMMUNITY): Payer: Self-pay

## 2022-01-25 ENCOUNTER — Other Ambulatory Visit: Payer: Self-pay

## 2022-01-25 MED ORDER — POTASSIUM CHLORIDE CRYS ER 20 MEQ PO TBCR
20.0000 meq | EXTENDED_RELEASE_TABLET | Freq: Every day | ORAL | 0 refills | Status: DC
  Filled 2022-01-25: qty 10, 10d supply, fill #0

## 2022-01-25 MED ORDER — OXYCODONE-ACETAMINOPHEN 5-325 MG PO TABS
1.0000 | ORAL_TABLET | Freq: Once | ORAL | Status: AC
  Administered 2022-01-25: 1 via ORAL
  Filled 2022-01-25: qty 1

## 2022-01-25 MED ORDER — OXYCODONE-ACETAMINOPHEN 5-325 MG PO TABS
1.0000 | ORAL_TABLET | Freq: Four times a day (QID) | ORAL | 0 refills | Status: DC | PRN
  Filled 2022-01-25: qty 20, 3d supply, fill #0

## 2022-01-25 MED ORDER — OXYCODONE-ACETAMINOPHEN 5-325 MG PO TABS
1.0000 | ORAL_TABLET | Freq: Four times a day (QID) | ORAL | 0 refills | Status: DC | PRN
Start: 2022-01-25 — End: 2024-03-27

## 2022-01-25 NOTE — ED Notes (Signed)
Patient transported to X-ray 

## 2022-01-25 NOTE — Discharge Instructions (Addendum)
It was our pleasure to provide your ER care today - we hope that you feel better.  Wear knee immobilizer. Ice/coldpack to sore area. Use walker and/or wheelchair to assist with mobility. Avoid weight bearing on left leg until cleared to do so by orthopedist. Follow up with orthopedist in the next 1-2 weeks - call office today to arrange appointment.  You may take percocet as need for pain. No driving for the next 6 hours or when taking percocet. Also, do not take tylenol or acetaminophen containing medication when taking percocet.   Your ct scan was read as showing:  Small lipohemarthrosis without visible fracture by CT. Occult nondisplaced fracture suspected. Recommend orthopedic surgery consultation with outpatient MRI follow-up. Moderate tricompartmental osteoarthritis. Discuss with orthopedist at follow up.   Your potassium level is mildly low  - eat plenty of fruits and vegetables, take potassium supplement as prescribed, and follow up with primary care doctor in 1-2 weeks. Also have your blood pressure rechecked then, as it is high today.   Return to ER if worse, new symptoms, severe/intractable pain, numbness/weakness, or other concern.

## 2022-01-25 NOTE — ED Provider Notes (Signed)
Patient received in handoff.  Fall on left knee.  At time of signout, attempting pain control and ambulatory trial.  Unfortunately, patient with inability to bear significant amount of weight on the knee despite negative trauma imaging.  Patient will stay in the emergency department for PT evaluation in the morning.   Teressa Lower, MD 01/25/22 [phone]

## 2022-01-25 NOTE — ED Notes (Signed)
Patient transported to CT 

## 2022-01-25 NOTE — ED Provider Notes (Addendum)
Signed out that persistent left knee pain post fall.  CT imaging is pending.   On exam, left knee tenderness anteriorly. Knee is grossly stable. No large effusion noted. No erythema or increased warmth. V mild swelling noted. Compartments of LLE soft, not tense. Distal pulses palp.   Pt denies other c/o, or other pain. No headache. No neck/back pain.   Icepack. Percocet 1 po.   Pt comfortable on recheck.   CT imaging obtained - small lipohemarthrosis and significant OA noted.   Orthopedics on call consulted - discussed pt, imaging, ct, w Ambrose Finland - he indicates knee imm, walker/crutches, outpatient f/u with Dr Doreatha Martin in a couple weeks.        Lajean Saver, MD 01/25/22 [phone]

## 2022-01-25 NOTE — ED Notes (Signed)
Multiple attempts by this RN and NT to ambulate patient with walker. Patient unable to safely ambulate, Provider made aware.

## 2022-01-25 NOTE — ED Notes (Addendum)
Pt transported to Xray. 

## 2022-01-26 ENCOUNTER — Emergency Department (HOSPITAL_COMMUNITY): Payer: Self-pay

## 2022-01-26 ENCOUNTER — Other Ambulatory Visit (HOSPITAL_COMMUNITY): Payer: Self-pay

## 2022-01-26 ENCOUNTER — Encounter (HOSPITAL_COMMUNITY): Payer: Self-pay | Admitting: Emergency Medicine

## 2022-01-26 ENCOUNTER — Other Ambulatory Visit: Payer: Self-pay

## 2022-01-26 ENCOUNTER — Emergency Department (HOSPITAL_COMMUNITY)
Admission: EM | Admit: 2022-01-26 | Discharge: 2022-01-26 | Disposition: A | Discharge status: Home / Self Care | Payer: Self-pay | Attending: Emergency Medicine | Admitting: Emergency Medicine

## 2022-01-26 DIAGNOSIS — Z79899 Other long term (current) drug therapy: Secondary | ICD-10-CM | POA: Insufficient documentation

## 2022-01-26 DIAGNOSIS — Z7901 Long term (current) use of anticoagulants: Secondary | ICD-10-CM | POA: Insufficient documentation

## 2022-01-26 DIAGNOSIS — M25562 Pain in left knee: Secondary | ICD-10-CM | POA: Insufficient documentation

## 2022-01-26 DIAGNOSIS — W1830XA Fall on same level, unspecified, initial encounter: Secondary | ICD-10-CM | POA: Insufficient documentation

## 2022-01-26 DIAGNOSIS — R531 Weakness: Secondary | ICD-10-CM | POA: Insufficient documentation

## 2022-01-26 DIAGNOSIS — I129 Hypertensive chronic kidney disease with stage 1 through stage 4 chronic kidney disease, or unspecified chronic kidney disease: Secondary | ICD-10-CM | POA: Insufficient documentation

## 2022-01-26 DIAGNOSIS — E1122 Type 2 diabetes mellitus with diabetic chronic kidney disease: Secondary | ICD-10-CM | POA: Insufficient documentation

## 2022-01-26 DIAGNOSIS — N189 Chronic kidney disease, unspecified: Secondary | ICD-10-CM | POA: Insufficient documentation

## 2022-01-26 DIAGNOSIS — I4891 Unspecified atrial fibrillation: Secondary | ICD-10-CM | POA: Insufficient documentation

## 2022-01-26 DIAGNOSIS — Z7984 Long term (current) use of oral hypoglycemic drugs: Secondary | ICD-10-CM | POA: Insufficient documentation

## 2022-01-26 DIAGNOSIS — Z7982 Long term (current) use of aspirin: Secondary | ICD-10-CM | POA: Insufficient documentation

## 2022-01-26 DIAGNOSIS — Z794 Long term (current) use of insulin: Secondary | ICD-10-CM | POA: Insufficient documentation

## 2022-01-26 LAB — BASIC METABOLIC PANEL
Anion gap: 9 (ref 5–15)
BUN: 33 mg/dL — ABNORMAL HIGH (ref 6–20)
CO2: 23 mmol/L (ref 22–32)
Calcium: 9.2 mg/dL (ref 8.9–10.3)
Chloride: 112 mmol/L — ABNORMAL HIGH (ref 98–111)
Creatinine, Ser: 1.78 mg/dL — ABNORMAL HIGH (ref 0.44–1.00)
GFR, Estimated: 34 mL/min — ABNORMAL LOW (ref >60)
Glucose, Bld: 143 mg/dL — ABNORMAL HIGH (ref 70–99)
Potassium: 3.4 mmol/L — ABNORMAL LOW (ref 3.5–5.1)
Sodium: 144 mmol/L (ref 135–145)

## 2022-01-26 LAB — CBC WITH DIFFERENTIAL/PLATELET
Abs Immature Granulocytes: 0.03 10*3/uL (ref 0.00–0.07)
Basophils Absolute: 0 10*3/uL (ref 0.0–0.1)
Basophils Relative: 0 %
Eosinophils Absolute: 0.2 10*3/uL (ref 0.0–0.5)
Eosinophils Relative: 3 %
HCT: 39.6 % (ref 36.0–46.0)
Hemoglobin: 12.6 g/dL (ref 12.0–15.0)
Immature Granulocytes: 0 %
Lymphocytes Relative: 19 %
Lymphs Abs: 1.4 10*3/uL (ref 0.7–4.0)
MCH: 28.6 pg (ref 26.0–34.0)
MCHC: 31.8 g/dL (ref 30.0–36.0)
MCV: 89.8 fL (ref 80.0–100.0)
Monocytes Absolute: 0.7 10*3/uL (ref 0.1–1.0)
Monocytes Relative: 9 %
Neutro Abs: 5.1 10*3/uL (ref 1.7–7.7)
Neutrophils Relative %: 69 %
Platelets: 161 10*3/uL (ref 150–400)
RBC: 4.41 MIL/uL (ref 3.87–5.11)
RDW: 13.4 % (ref 11.5–15.5)
WBC: 7.4 10*3/uL (ref 4.0–10.5)
nRBC: 0 % (ref 0.0–0.2)

## 2022-01-26 LAB — CBG MONITORING, ED: Glucose-Capillary: 308 mg/dL — ABNORMAL HIGH (ref 70–99)

## 2022-01-26 MED ORDER — OXYCODONE-ACETAMINOPHEN 5-325 MG PO TABS
1.0000 | ORAL_TABLET | Freq: Once | ORAL | Status: AC
  Administered 2022-01-26: 1 via ORAL
  Filled 2022-01-26: qty 1

## 2022-01-26 NOTE — ED Triage Notes (Signed)
Per EMS-states she had a fall on Sunday injuring left knee-states she has had a previous stroke with left side deficits-patient is having trouble ambulating and wants rehab-patient has contacted [facility] and they have 1 bed available-patient's PCP told her she has to come to office for eval and referral-patient came to ED due to transportation issues

## 2022-01-26 NOTE — ED Notes (Signed)
Patient ambulated with crutches with no difficulties .

## 2022-01-26 NOTE — Progress Notes (Signed)
Received message from [facility] stating that the daughter called and requested that the patient be placed in a SNF now. Patient was discharged home yesterday with recommendation for Outpatient follow up.  TCT daughter Trevor Mace [phone]). Daughter wants patient placed in SNF. I instructed her to contact the Primary Care Physician and discuss these issues with him and he will decide whether or not this is appropiate.  Aneta Mins Transition of Care Supervisor [phone]

## 2022-01-26 NOTE — Discharge Instructions (Addendum)
It was a pleasure taking care of you today. As discussed, your MRI showed arthritis and a torn meniscus. I have included the number of the orthopedic surgeon. Call tomorrow to schedule an appointment for further evaluation. Take your medication as previous prescribed yesterday. Follow-up with PCP within the next week.

## 2022-01-26 NOTE — ED Notes (Signed)
TOC consulted for SNF placement. After chart review CSW found that pt does not have insurance at this time. CSW reached out to pt about insurance status. Pt states that she does not have insurance. CSW inquired as to if pt would be able to pay the private pay cost at the facility. Pt states that she will not be prepared to pay that at this time.   PT informed CSW that she has spoken to someone with [facility] who states she can come to rehab and they will bill her later. CSW explained that this is not typically how the facilities work. CSW reached out to [facility] Admissions for clarification. CSW awaiting return call from admissions. TOC to follow.

## 2022-01-26 NOTE — Progress Notes (Signed)
TOC supervisor spoke with patient's daughter to follow up on [place] conversation as daughter expressed a desire to speak with supervisor.  Daughter states she is aware and understands that placement is not an option.  Requests for outpatient services to be set up in the home.  CM explained that a [facility] referral can be made with no guarantee that the agency will accept and provide services.  Discussed outpatient PT services and explained that patient will need to go to outpatient ortho appointment first and if appropriate a referral can be made from that office.  CM spoke with [employer] rep Anderson Malta and made a referral for [facility].  Anderson Malta reports will need to get this approved through [employer] and will update CM if services are available or not.

## 2022-01-26 NOTE — ED Provider Notes (Signed)
[facility] DEPT Provider Note   CSN: [id number] Arrival date & time: 01/26/22  1223     History  Chief Complaint  Patient presents with   Knee Pain    Adrienne Lane is a 56 y.o. female with a past medical history significant for hypertension, hyperlipidemia, diabetes, history of CVA, fibroids, A-fib with RVR on Pradaxa, and CKD who presents to the ED due to ongoing left knee pain.  Patient has chronic left lower extremity weakness after a CVA in January 2022.  Patient states she fell a 2 days ago when her legs got tangled up causing her to fall.  She has been unable to bear weight on her left leg.  Patient seen in the ED yesterday where CT knee was performed which demonstrated small lipohemarthrosis without visible fracture; however, occult nondisplaced fracture suspected. Orthopedics was consulted yesterday who recommended knee immobilizer, walker/crutches, and outpatient follow-up with Dr. Doreatha Martin. Patient returns today requesting SNF placement due to inability to get around. She has been unable to pick up her pain medication from the pharmacy.        Home Medications Prior to Admission medications   Medication Sig Start Date End Date Taking? Authorizing Provider  acetaminophen (TYLENOL) 500 MG tablet Take 500 mg by mouth every 6 (six) hours as needed for moderate pain or headache.    [provider]  amLODipine (NORVASC) 10 MG tablet Take 1 tablet (10 mg total) by mouth daily. 09/29/21   Bensimhon, Shaune Pascal, MD  aspirin EC 81 MG tablet Take 81 mg by mouth daily. Swallow whole.    [provider]  atorvastatin (LIPITOR) 80 MG tablet Take 1 tablet (80 mg total) by mouth daily. Patient taking differently: Take 80 mg by mouth at bedtime. 01/15/22   Bensimhon, Shaune Pascal, MD  Bromfenac Sodium (PROLENSA) 0.07 % SOLN Place 1 drop into the left eye 4 (four) times daily. Patient not taking: Reported on 01/25/2022 10/30/21   Bernarda Caffey, MD   carvedilol (COREG) 6.25 MG tablet Take 1 tablet (6.25 mg total) by mouth 2 (two) times daily with a meal. 09/29/21   Bensimhon, Shaune Pascal, MD  cholecalciferol (VITAMIN D3) 25 MCG (1000 UNIT) tablet Take 1,000 Units by mouth daily.    [provider]  dabigatran (PRADAXA) 150 MG CAPS capsule TAKE 1 CAPSULE (150 MG TOTAL) BY MOUTH EVERY 12 HOURS. Patient taking differently: Take 150 mg by mouth 2 (two) times daily. 02/06/21 02/06/22  Bensimhon, Shaune Pascal, MD  dapagliflozin propanediol (FARXIGA) 10 MG TABS tablet Take 1 tablet (10 mg total) by mouth daily before breakfast. 09/29/21   Bensimhon, Shaune Pascal, MD  glucose blood (TRUE METRIX BLOOD GLUCOSE TEST) test strip Use as instructed 04/01/21   Charlott Rakes, MD  hydrALAZINE (APRESOLINE) 100 MG tablet Take 1 tablet (100 mg total) by mouth 3 (three) times daily. 09/29/21   Bensimhon, Shaune Pascal, MD  insulin aspart (NOVOLOG) 100 UNIT/ML injection Use as directed sliding scale insulin TID. Patient taking differently: Inject 2-5 Units into the skin daily. 05/22/21   Nicolette Bang, MD  insulin glargine (LANTUS) 100 UNIT/ML injection inject 22 by subcutaneous route as per insulin protocol for 30 days Patient taking differently: Inject 22 Units into the skin daily. 12/28/21     Insulin Syringe-Needle U-100 (TRUEPLUS INSULIN SYRINGE) 30G X 5/16" 0.3 ML MISC USE AS DIRECTED 3 TIMES DAILY. 12/28/21 12/28/22  Dorna Mai, MD  Insulin Syringe-Needle U-100 30G X 5/16" 0.5 ML MISC Use as directed.  04/09/21   Argentina Donovan, PA-C  isosorbide mononitrate (IMDUR) 60 MG 24 hr tablet Take 1 tablet (60 mg total) by mouth daily. 09/29/21   Bensimhon, Shaune Pascal, MD  ketorolac (ACULAR) 0.5 % ophthalmic solution Place 1 drop into the left eye 4 (four) times daily. 11/09/21 11/09/22  Bernarda Caffey, MD  Lancets [facility] ULTRASOFT) lancets Check CBG twice a day 01/21/21   Nicolette Bang, MD  mexiletine (MEXITIL) 150 MG capsule TAKE 2 CAPSULES (300 MG TOTAL)  BY MOUTH EVERY 12 (TWELVE) HOURS. Patient taking differently: Take 300 mg by mouth 2 (two) times daily. 09/15/21 09/15/22  Bensimhon, Shaune Pascal, MD  Multiple Vitamins-Minerals (WOMENS MULTI PO) Take 1 tablet by mouth daily.    [provider]  Multiple Vitamins-Minerals (ZINC PO) Take 1 tablet by mouth daily.    [provider]  oxyCODONE-acetaminophen (PERCOCET/ROXICET) 5-325 MG tablet Take 1-2 tablets by mouth every 6 (six) hours as needed for severe pain. 01/25/22   Lajean Saver, MD  potassium chloride (KLOR-CON) 10 MEQ tablet Take 1 tablet (10 mEq total) by mouth daily. 09/29/21   Bensimhon, Shaune Pascal, MD  potassium chloride SA (KLOR-CON M) 20 MEQ tablet Take 1 tablet (20 mEq total) by mouth daily. 01/25/22   Lajean Saver, MD  prednisoLONE acetate (PRED FORTE) 1 % ophthalmic suspension Place 1 drop into the left eye 4 (four) times daily. 10/30/21   Bernarda Caffey, MD  sacubitril-valsartan (ENTRESTO) 24-26 MG Take 1 tablet by mouth 2 (two) times daily. 04/22/21   Bensimhon, Shaune Pascal, MD  ELIQUIS 5 MG TABS tablet TAKE 1 TABLET (5 MG TOTAL) BY MOUTH 2 (TWO) TIMES DAILY. 12/24/20 12/27/20  Bensimhon, Shaune Pascal, MD  torsemide (DEMADEX) 20 MG tablet TAKE 2 TABLETS (40 MG TOTAL) BY MOUTH 2 (TWO) TIMES DAILY. 11/24/20 12/27/20  Darrick Grinder D, NP      Allergies    Adhesive [tape]    Review of Systems   Review of Systems  Musculoskeletal:  Positive for arthralgias, gait problem and joint swelling.   Physical Exam Updated Vital Signs BP (!) 168/92 (BP Location: Right Arm)    Pulse 77    Temp 98.1 F (36.7 C) (Oral)    Resp 18    LMP 05/12/2020 (Exact Date)    SpO2 96%  Physical Exam Vitals and nursing note reviewed.  Constitutional:      General: She is not in acute distress.    Appearance: She is not ill-appearing.  HENT:     Head: Normocephalic.  Eyes:     Pupils: Pupils are equal, round, and reactive to light.  Cardiovascular:     Rate and Rhythm: Normal rate and regular rhythm.      Pulses: Normal pulses.     Heart sounds: Normal heart sounds. No murmur heard.   No friction rub. No gallop.  Pulmonary:     Effort: Pulmonary effort is normal.     Breath sounds: Normal breath sounds.  Abdominal:     General: Abdomen is flat. There is no distension.     Palpations: Abdomen is soft.     Tenderness: There is no abdominal tenderness. There is no guarding or rebound.  Musculoskeletal:        General: Normal range of motion.     Cervical back: Neck supple.     Comments: Tenderness throughout left knee with edema. Pedal pulses palpable.   Skin:    General: Skin is warm and dry.  Neurological:  General: No focal deficit present.     Mental Status: She is alert.  Psychiatric:        Mood and Affect: Mood normal.        Behavior: Behavior normal.    ED Results / Procedures / Treatments   Labs (all labs ordered are listed, but only abnormal results are displayed) Labs Reviewed  BASIC METABOLIC PANEL - Abnormal; Notable for the following components:      Result Value   Potassium 3.4 (*)    Chloride 112 (*)    Glucose, Bld 143 (*)    BUN 33 (*)    Creatinine, Ser 1.78 (*)    GFR, Estimated 34 (*)    All other components within normal limits  CBG MONITORING, ED - Abnormal; Notable for the following components:   Glucose-Capillary 308 (*)    All other components within normal limits  CBC WITH DIFFERENTIAL/PLATELET    EKG None  Radiology DG Chest 2 View  Result Date: 01/24/2022 CLINICAL DATA:  Fever EXAM: CHEST - 2 VIEW COMPARISON:  01/20/2021 FINDINGS: The heart size and mediastinal contours are within normal limits. Both lungs are clear. The visualized skeletal structures are unremarkable. IMPRESSION: No active cardiopulmonary disease. Electronically Signed   By: Fidela Salisbury M.D.   On: 01/24/2022 21:10   CT Knee Left Wo Contrast  Result Date: 01/25/2022 CLINICAL DATA:  Left knee pain after fall. EXAM: CT OF THE LEFT KNEE WITHOUT CONTRAST TECHNIQUE:  Multidetector CT imaging of the left knee was performed according to the standard protocol. Multiplanar CT image reconstructions were also generated. RADIATION DOSE REDUCTION: This exam was performed according to the departmental dose-optimization program which includes automated exposure control, adjustment of the mA and/or kV according to patient size and/or use of iterative reconstruction technique. COMPARISON:  Left knee x-rays from same day. FINDINGS: Bones/Joint/Cartilage No acute fracture or dislocation. Moderate medial and lateral patellofemoral compartment joint space narrowing. Tricompartmental marginal osteophytes. Small lipohemarthrosis. Ligaments Ligaments are suboptimally evaluated by CT. Muscles and Tendons Grossly intact. Soft tissue No fluid collection or hematoma.  No soft tissue mass. IMPRESSION: 1. Small lipohemarthrosis without visible fracture by CT. Occult nondisplaced fracture suspected. Recommend orthopedic surgery consultation with outpatient MRI follow-up. 2. Moderate tricompartmental osteoarthritis. Electronically Signed   By: Titus Dubin M.D.   On: 01/25/2022 08:42   MR KNEE LEFT WO CONTRAST  Result Date: 01/26/2022 CLINICAL DATA:  Knee trauma.  Occult fracture suspected. EXAM: MRI OF THE LEFT KNEE WITHOUT CONTRAST TECHNIQUE: Multiplanar, multisequence MR imaging of the knee was performed. No intravenous contrast was administered. COMPARISON:  Left knee radiographs 01/24/2022 CT left knee 01/25/2022 FINDINGS: There is high-grade motion artifact on the sagittal T2 weighted image. MENISCI Medial meniscus: There is mild-to-moderate attenuation and are multiple areas of superficial oblique linear tears within the superior articular surface of the peripheral third of the meniscal triangle near the root (sagittal series 9, image 9) and superior and inferior articular surfaces of the central third of the meniscal triangle (sagittal image 7) of the posterior horn of the medial meniscus.  There is high-grade attenuation of the body of the medial meniscus with diffuse chronic tearing of the middle and central thirds of the meniscal triangle. Lateral meniscus: There is focal high-grade intermediate proton density signal within the posterior horn of the lateral meniscus near the root (sagittal image 15 and coronal image 12) likely degenerative change. Otherwise no discrete tear is seen extending through the articular surface of the lateral meniscus. LIGAMENTS  Cruciates: The ACL and PCL are intact. Collaterals: The medial collateral ligament is intact. The fibular collateral ligament, biceps femoris tendon, iliotibial band, and popliteus tendon are intact. CARTILAGE Patellofemoral: Moderate to high-grade thinning of the inferior aspect of the lateral patellar facet cartilage with moderate subchondral cystic change. There is also full-thickness cartilage loss within the superolateral aspect of the lateral trochlea. Focal high-grade partial-thickness cartilage loss within the mid to superior aspect of the junction of the trochlear notch and medial trochlea. Medial: Diffuse high-grade partial to full-thickness cartilage loss throughout the mid to medial aspect of the weight-bearing medial femoral condyle and medial tibial plateau. Mild-to-moderate subchondral marrow edema. Lateral: Moderate thinning of the medial aspect of the weight-bearing lateral femoral condyle cartilage. Full-thickness cartilage loss within the adjacent medial aspect of the lateral tibial plateau cartilage with mild subchondral increased T2 signal. Joint: Largejoint effusion. Normal Hoffa's fat pad. No plical thickening. Popliteal Fossa:  No Baker's cyst. Extensor Mechanism:  Intact quadriceps tendon and patellar tendon. Bones:  No acute fracture or dislocation. Other: None. IMPRESSION:: IMPRESSION: 1. 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 Moderate lateral compartment osteoarthritis. 2. High-grade  degenerative tearing throughout the body of the medial meniscus with multiple superior and inferior articular surface tears of the posterior horn of the medial meniscus. 3. Intrasubstance degeneration within the posterior horn of the lateral meniscus. 4. Large joint effusion. 5. No lipohemarthrosis is visualized. No acute fracture is seen. The questioned fat on yesterday's CT suggesting a "lipohemarthrosis "may represent normal suprapatellar fat which appeared on CT to represent fat floating within the joint fluid. Electronically Signed   By: Yvonne Kendall M.D.   On: 01/26/2022 17:59   DG Knee Complete 4 Views Left  Result Date: 01/24/2022 CLINICAL DATA:  Left knee pain.  Fell today. EXAM: LEFT KNEE - COMPLETE 4+ VIEW COMPARISON:  None. FINDINGS: No evidence of fracture. Medial compartment joint space narrowing consistent with osteoarthritis. Some arthritic change also of the patellofemoral joint. No visible joint effusion. IMPRESSION: No acute finding. Medial compartment and patellofemoral compartment osteoarthritis. Electronically Signed   By: Nelson Chimes M.D.   On: 01/24/2022 21:12   DG Foot Complete Left  Result Date: 01/24/2022 CLINICAL DATA:  Fall, left great toe pain EXAM: LEFT FOOT - COMPLETE 3+ VIEW COMPARISON:  None. FINDINGS: The nail of the left great toe appears sclerotic and misshapen, chronic deformity versus is posttraumatic. Osseous structures appear mildly diffusely osteopenic. Normal alignment. No acute fracture or dislocation. Moderate plantar calcaneal spur. Vascular calcifications are noted. IMPRESSION: Sclerotic, misshapen nail of the great toe, chronic versus posttraumatic. No acute fracture or dislocation. Electronically Signed   By: Fidela Salisbury M.D.   On: 01/24/2022 21:13   DG HIP UNILAT W OR W/O PELVIS 2-3 VIEWS LEFT  Result Date: 01/25/2022 CLINICAL DATA:  56 year old female status post fall yesterday with left hip and leg pain. EXAM: DG HIP (WITH OR WITHOUT PELVIS) 2-3V LEFT  COMPARISON:  CT Abdomen and Pelvis 06/12/2020. FINDINGS: Femoral heads are normally located. Hip joint spaces appear stable since 2021. Intact pelvis. SI joints appear symmetric. Grossly intact proximal right femur. Negative visible lower abdominal and pelvic visceral contours. Incidental pelvic phleboliths. Proximal left femur intact. Calcified left femoral artery atherosclerosis. Chronic lower lumbar disc and endplate degeneration. IMPRESSION: No acute fracture or dislocation identified about the left hip or pelvis. Electronically Signed   By: Genevie Ann M.D.   On: 01/25/2022 07:57    Procedures Procedures    Medications Ordered in  ED Medications  oxyCODONE-acetaminophen (PERCOCET/ROXICET) 5-325 MG per tablet 1 tablet (1 tablet Oral Given 01/26/22 1709)    ED Course/ Medical Decision Making/ A&P                           Medical Decision Making Amount and/or Complexity of Data Reviewed Independent Historian: friend    Details: COusin at bedside provided history Labs: ordered. Decision-making details documented in ED Course. Radiology: ordered and independent interpretation performed. Decision-making details documented in ED Course.  Risk Prescription drug management.  56 year old female presents to the ED due to persistent left knee pain after a fall that occurred 2 days ago.  Patient seen in the ED yesterday where x-rays and CT images of her left knee were performed which was negative for any bony fractures.  X-ray of the patient's left hip and foot were also obtained which were negative for any fractures.  Patient returns today given persistent pain and difficulties ambulating.  No further falls.  Upon arrival, stable vitals.  Patient no acute distress.  Tenderness throughout left knee with mild edema.  Left lower extremity neurovascularly intact with soft compartments.  Low suspicion for compartment syndrome.  No infectious symptoms. Low suspicion for septic joint. No lumbar midline  tenderness.  No tenderness throughout left hip.  Reviewed CT images from yesterday which demonstrates notes suspected fracture due to lipohemarthrosis. Will obtain MRI to rule out fracture. Labs ordered. Pain medication given.  CBC unremarkable.  No leukocytosis and normal hemoglobin.  MRI reviewed which demonstrates: IMPRESSION:  1. Severe medial and lateral patellofemoral compartment  osteoarthritis. Moderate lateral compartment osteoarthritis.  2. High-grade degenerative tearing throughout the body of the medial  meniscus with multiple superior and inferior articular surface tears  of the posterior horn of the medial meniscus.  3. Intrasubstance degeneration within the posterior horn of the  lateral meniscus.  4. Large joint effusion.  5. No lipohemarthrosis is visualized. No acute fracture is seen. The  questioned fat on yesterday's CT suggesting a "lipohemarthrosis "may  represent normal suprapatellar fat which appeared on CT to represent  fat floating within the joint fluid.  I had a long discussion with patient in regards to her results and need to see an orthopedic surgeon. She notes some improvement in pain. Cousin at bedside. Patient requesting another knee immobilizer. Patient given Dr. Tama Headings number yesterday, will give again and advise patient to call tomorrow to schedule an appointment for further evaluation. Patient able to ambulate with crutches here in the ED. Patient prescribed pain medication yesterday and advised to take as prescribed. Strict ED precautions discussed with patient. Patient states understanding and agrees to plan. Patient discharged home in no acute distress and stable vitals  Discussed with Dr. Tomi Bamberger who agrees with assessment and plan.         Final Clinical Impression(s) / ED Diagnoses Final diagnoses:  Acute pain of left knee    Rx / DC Orders ED Discharge Orders     None         Karie Kirks 01/26/22 2031    Dorie Rank, MD 01/27/22 [id number]

## 2022-01-26 NOTE — ED Provider Triage Note (Addendum)
Emergency Medicine Provider Triage Evaluation Note  Adrienne Lane , a 56 y.o. female  was evaluated in triage.  Pt requests skilled nursing facility placement after a fall.  She was seen and evaluated at [facility] 2 days ago after a fall had negative imaging and was ultimately discharged.  Patient still unable to weight-bear on the left leg and patient and family requesting referral to SNF.  Review of Systems  Positive:  Negative: See above   Physical Exam  BP (!) 153/78 (BP Location: Right Arm)    Pulse 65    Temp 98.2 F (36.8 C) (Oral)    Resp 16    LMP 05/12/2020 (Exact Date)    SpO2 99%  Gen:   Awake, no distress   Resp:  Normal effort  MSK:   Moves extremities without difficulty  Other:    Medical Decision Making  Medically screening exam initiated at 12:42 PM.  Appropriate orders placed.  Shilpa Bushee was informed that the remainder of the evaluation will be completed by another provider, this initial triage assessment does not replace that evaluation, and the importance of remaining in the ED until their evaluation is complete.  I spoke with social work who evaluated the patient at bedside.  Patient does not have insurance nor does she have money to pay for a rehab facility.  Social work Glass blower/designer who said that she would not be an adequate candidate.   Hendricks Limes, PA-C 01/26/22 1244    Myna Bright M, PA-C 01/26/22 1406

## 2022-01-26 NOTE — Progress Notes (Signed)
Orthopedic Tech Progress Note Patient Details:  Adrienne Lane 03-13-1966 [id number]  Ortho Devices Type of Ortho Device: Crutches, Knee Immobilizer Ortho Device/Splint Location: left Ortho Device/Splint Interventions: Ordered, Application, Adjustment   Post Interventions Patient Tolerated: Well Instructions Provided: Care of device, Adjustment of device  Charline Bills Jalayna Josten 01/26/2022, 8:26 PM Applied knee immobilizer. Delivered and adjusted crutches and worked with patient on using them.

## 2022-02-05 NOTE — Progress Notes (Signed)
[facility] Note  02/12/2022     CHIEF COMPLAINT Patient presents for Retina Follow Up  HISTORY OF PRESENT ILLNESS: Adrienne Lane is a 56 y.o. female who presents to the clinic today for:  HPI     Retina Follow Up   Patient presents with  Diabetic Retinopathy.  In both eyes.  Duration of 4 weeks.  Since onset it is gradually worsening.  I, the attending physician,  performed the HPI with the patient and updated documentation appropriately.        Comments   4 week follow up VH OD, PDR OU- She has had blurred vision and floaters for 1 1/2 weeks OD.  Patient fell about 3 weeks ago (did not hit her head).  She has been in the bed a lot because of her knee.  This is why she has not called for a sooner appt. Using Prednisolone QID and Ketorolac QID OS.   BS 123 yesterday, A1C 7.2       Last edited by Bernarda Caffey, MD on 02/12/2022 12:04 PM.    She states BP and blood sugar have been good except for a couple weeks ago when she fell and had to go to the hospital, vision is stable    Referring physician: [facility] [place],  [place] [address]  HISTORICAL INFORMATION:   Selected notes from the MEDICAL RECORD NUMBER Referred by Dr. Wyatt Portela for concern of VH   CURRENT MEDICATIONS: Current Outpatient Medications (Ophthalmic Drugs)  Medication Sig   ketorolac (ACULAR) 0.5 % ophthalmic solution Place 1 drop into the left eye 4 (four) times daily.   prednisoLONE acetate (PRED FORTE) 1 % ophthalmic suspension Place 1 drop into the left eye 4 (four) times daily.   Bromfenac Sodium (PROLENSA) 0.07 % SOLN Place 1 drop into the left eye 4 (four) times daily. (Patient not taking: Reported on 01/25/2022)   No current facility-administered medications for this visit. (Ophthalmic Drugs)   Current Outpatient Medications (Other)  Medication Sig   acetaminophen (TYLENOL) 500 MG tablet Take 500 mg by mouth every 6 (six)  hours as needed for moderate pain or headache.   amLODipine (NORVASC) 10 MG tablet Take 1 tablet (10 mg total) by mouth daily.   aspirin EC 81 MG tablet Take 81 mg by mouth daily. Swallow whole.   atorvastatin (LIPITOR) 80 MG tablet Take 1 tablet (80 mg total) by mouth daily. (Patient taking differently: Take 80 mg by mouth at bedtime.)   carvedilol (COREG) 6.25 MG tablet Take 1 tablet (6.25 mg total) by mouth 2 (two) times daily with a meal.   cholecalciferol (VITAMIN D3) 25 MCG (1000 UNIT) tablet Take 1,000 Units by mouth daily.   dapagliflozin propanediol (FARXIGA) 10 MG TABS tablet Take 1 tablet (10 mg total) by mouth daily before breakfast.   hydrALAZINE (APRESOLINE) 100 MG tablet Take 1 tablet (100 mg total) by mouth 3 (three) times daily.   insulin aspart (NOVOLOG) 100 UNIT/ML injection Use as directed sliding scale insulin TID. (Patient taking differently: Inject 2-5 Units into the skin daily.)   insulin glargine (LANTUS) 100 UNIT/ML injection inject 22 by subcutaneous route as per insulin protocol for 30 days (Patient taking differently: Inject 22 Units into the skin daily.)   isosorbide mononitrate (IMDUR) 60 MG 24 hr tablet Take 1 tablet (60 mg total) by mouth daily.   mexiletine (MEXITIL) 150 MG capsule TAKE 2 CAPSULES (300 MG  TOTAL) BY MOUTH EVERY 12 (TWELVE) HOURS. (Patient taking differently: Take 300 mg by mouth 2 (two) times daily.)   Multiple Vitamins-Minerals (WOMENS MULTI PO) Take 1 tablet by mouth daily.   Multiple Vitamins-Minerals (ZINC PO) Take 1 tablet by mouth daily.   potassium chloride (KLOR-CON) 10 MEQ tablet Take 1 tablet (10 mEq total) by mouth daily.   potassium chloride SA (KLOR-CON M) 20 MEQ tablet Take 1 tablet (20 mEq total) by mouth daily.   sacubitril-valsartan (ENTRESTO) 24-26 MG Take 1 tablet by mouth 2 (two) times daily.   TRUEplus Lancets 28G MISC Use to check fasting blood glucose 3 times daily   dabigatran (PRADAXA) 150 MG CAPS capsule TAKE 1 CAPSULE (150  MG TOTAL) BY MOUTH EVERY 12 HOURS. (Patient taking differently: Take 150 mg by mouth 2 (two) times daily.)   glucose blood (TRUE METRIX BLOOD GLUCOSE TEST) test strip Use to check fasting blood glucose 3 times daily   Insulin Syringe-Needle U-100 (TRUEPLUS INSULIN SYRINGE) 30G X 5/16" 0.3 ML MISC USE AS DIRECTED 3 TIMES DAILY.   Insulin Syringe-Needle U-100 30G X 5/16" 0.5 ML MISC Use as directed.   Lancets (ONETOUCH ULTRASOFT) lancets Check CBG twice a day   oxyCODONE-acetaminophen (PERCOCET/ROXICET) 5-325 MG tablet Take 1-2 tablets by mouth every 6 (six) hours as needed for severe pain. (Patient not taking: Reported on 02/12/2022)   No current facility-administered medications for this visit. (Other)   REVIEW OF SYSTEMS: ROS   Positive for: Genitourinary, Endocrine, Cardiovascular, Eyes, Respiratory Negative for: Constitutional, Gastrointestinal, Neurological, Skin, Musculoskeletal, HENT, Psychiatric, Allergic/Imm, Heme/Lymph Last edited by Leonie Douglas, COA on 02/12/2022  9:23 AM.     ALLERGIES Allergies  Allergen Reactions   Adhesive [Tape]     Tears skin, can tolerate paper tape   PAST MEDICAL HISTORY Past Medical History:  Diagnosis Date   Atrial fibrillation with RVR ([place])    Bigeminy 01/2020   Cataract    CHF (congestive heart failure) ([place]) 10/20/2019   CKD (chronic kidney disease), stage IV (HCC)    Diabetes mellitus without complication ([place])    Diabetic retinopathy ([place])    DOE (dyspnea on exertion)    walking upstairs or up hill resolves in one minute   Dysrhythmia    a-fib - on pradaxa   Fibroids    History of kidney stones    History of recent blood transfusion 02/26/2020   Hypertension    Hypertensive retinopathy    Iron deficiency anemia    Non-ischemic cardiomyopathy ([place])    tachycardia induced   Obese    Peripheral edema    Premature ventricular contractions (PVCs) ([facility])    Sleep apnea    does not use cpap   Stroke ([place]) 53/6144   Umbilical hernia     Wears glasses    Past Surgical History:  Procedure Laterality Date   CATARACT EXTRACTION     CHOLECYSTECTOMY     CYSTOSCOPY W/ URETERAL STENT PLACEMENT Bilateral 05/24/2020   Procedure: CYSTOSCOPY WITH RETROGRADE PYELOGRAM/URETERAL STENT PLACEMENT;  Surgeon: Robley Fries, MD;  Location: [facility];  Service: Urology;  Laterality: Bilateral;   CYSTOSCOPY/RETROGRADE/URETEROSCOPY Bilateral 04/03/2020   Procedure: CYSTOSCOPY/RETROGRADE/URETEROSCOPY;  Surgeon: Ardis Hughs, MD;  Location: [facility];  Service: Urology;  Laterality: Bilateral;   EYE SURGERY Left 08/2021   cataract removed   HYSTERECTOMY ABDOMINAL WITH SALPINGO-OOPHORECTOMY  07/21/2020   Procedure: HYSTERECTOMY ABDOMINAL WITH BILATERAL SALPINGO-OOPHORECTOMY;  Surgeon: Sanjuana Kava, MD;  Location: [place];  Service: Gynecology;;   INJECTION OF  SILICONE OIL Left 21/30/8657   Procedure: INJECTION OF SILICONE OIL;  Surgeon: Bernarda Caffey, MD;  Location: [place];  Service: Ophthalmology;  Laterality: Left;   IR FLUORO GUIDE CV LINE RIGHT  02/21/2020   IR US GUIDE VASC ACCESS RIGHT  02/21/2020   MEMBRANE PEEL Left 10/08/2021   Procedure: MEMBRANE PEEL;  Surgeon: Bernarda Caffey, MD;  Location: [place];  Service: Ophthalmology;  Laterality: Left;   NEPHROLITHOTOMY Left 02/14/2020   Procedure: NEPHROLITHOTOMY PERCUTANEOUS/ SURGEON ACCESS/ LEFT PERCUTANEOUS NEPHROSTOMY TUBE PLACEMENT;  Surgeon: Ardis Hughs, MD;  Location: [facility];  Service: Urology;  Laterality: Left;   NEPHROLITHOTOMY Left 02/21/2020   Procedure: NEPHROLITHOTOMY PERCUTANEOUS SECOND LOOK;  Surgeon: Ardis Hughs, MD;  Location: [facility];  Service: Urology;  Laterality: Left;   NEPHROLITHOTOMY Left 02/26/2020   Procedure: NEPHROLITHOTOMY PERCUTANEOUS;  Surgeon: Ceasar Mons, MD;  Location: [facility];  Service: Urology;  Laterality: Left;  NEED 150 MIN   NEPHROLITHOTOMY Right 03/24/2020   Procedure: NEPHROLITHOTOMY PERCUTANEOUS WITH ACCESS LEFT STENT  REMOVAL;  Surgeon: Ardis Hughs, MD;  Location: [facility];  Service: Urology;  Laterality: Right;   PARS PLANA VITRECTOMY Left 10/08/2021   Procedure: PARS PLANA VITRECTOMY WITH 25 GAUGE;  Surgeon: Bernarda Caffey, MD;  Location: [place];  Service: Ophthalmology;  Laterality: Left;   PHOTOCOAGULATION WITH LASER Left 10/08/2021   Procedure: PHOTOCOAGULATION WITH LASER;  Surgeon: Bernarda Caffey, MD;  Location: [place];  Service: Ophthalmology;  Laterality: Left;   RIGHT HEART CATH N/A 11/09/2019   Procedure: RIGHT HEART CATH;  Surgeon: Jolaine Artist, MD;  Location: [facility];  Service: Cardiovascular;  Laterality: N/A;   WISDOM TOOTH EXTRACTION     FAMILY HISTORY Family History  Problem Relation Age of Onset   Atrial fibrillation Mother    Hypertension Mother    Stroke Mother 3   Diabetes Mother    Diabetes Father    Hypertension Father    Diabetes Sister    Hypertension Sister    Diabetes Brother    Hypertension Brother    Diabetes Brother    Heart attack Brother    Stroke Maternal Grandmother 38   SOCIAL HISTORY Social History   Tobacco Use   Smoking status: Never   Smokeless tobacco: Never  Vaping Use   Vaping Use: Never used  Substance Use Topics   Alcohol use: Never   Drug use: Never       OPHTHALMIC EXAM:  Base Eye Exam     Visual Acuity (Snellen - Linear)       Right Left   Dist cc 20/30 CF 3'   Dist ph cc NI     Correction: Glasses         Tonometry (Tonopen, 9:34 AM)       Right Left   Pressure 13 16         Pupils       Dark Light Shape React APD   Right 3 2 Round Brisk None   Left 4 5 Round Brisk +2         Visual Fields (Counting fingers)   Poor fixation superiorly on both eyes.  Questionable defect OU.         Extraocular Movement       Right Left    Full Full         Neuro/Psych     Oriented x3: Yes   Mood/Affect: Normal         Dilation  Both eyes: 1.0% Mydriacyl, 2.5% Phenylephrine @ 9:34 AM            Slit Lamp and Fundus Exam     External Exam       Right Left   External Normal Normal         Slit Lamp Exam       Right Left   Lids/Lashes Dermatochalasis - upper lid Dermatochalasis - upper lid   Conjunctiva/Sclera Melanosis sutures dissolved, mild melanosis   Cornea mild arcus, trace PEE mild arcus, well healed cataract wound, 1+ Punctate epithelial erosions   Anterior Chamber Deep and clear Deep and quiet, 1-2+ cell and pigment   Iris Round and dilated, No NVI Round and dilated, No NVI   Lens 1-2+ Nuclear sclerosis, 1-2+ Cortical cataract, fine pigment deposition on posterior capsule PC IOL in good position, pigment deposition on optic, 1-2+PCO   Anterior Vitreous Vitreous syneresis, +RBC, blood stained vitreous condensations increased/worse post vitrectomy, good oil fill, +pigment         Fundus Exam       Right Left   Disc Hazy view, perfused 1+pallor, sharp nasal rim, +temporal edema - improved   C/D Ratio 0.4 0.2   Macula Flat, Blunted foveal reflex, scattered MA, RPE mottling Flat under oil, fibrosis improved, scattered IRH/DBH, residual fibrosis and thickening nasal macula -- improved   Vessels attenuated, Tortuous, peripheral sclerosis and fibrosis Severe attenuation / sclerosis, +fibrosis improved, tortuousity   Periphery Attached, scattered patches of fibrosis and NVE, small flap tear at 0130 equator--no SRF, good laser surrounding, old VH settled inferiorly - improving; PRP laser changes Attached, Scattered DBH; good PRP changes 360; scattered fibrosis           Refraction     Wearing Rx       Sphere Cylinder Axis   Right -2.50 +1.75 178   Left -2.50 +1.25 018         Manifest Refraction       Sphere Cylinder Axis Dist VA   Right -2.00 +1.25 175 20/30   Left               IMAGING AND PROCEDURES  Imaging and Procedures for 02/12/2022  OCT, Retina - OU - Both Eyes       Right Eye Quality was good. Central Foveal Thickness:  207. Progression has worsened. Findings include normal foveal contour, no IRF, no SRF, intraretinal hyper-reflective material (Interval re-development of vitreous opacities / VH, Persistent fibrosis of nasal disc; macula and fovea stable).   Left Eye Quality was good. Central Foveal Thickness: 433. Progression has improved. Findings include abnormal foveal contour, intraretinal fluid, no SRF, outer retinal atrophy, inner retinal atrophy, macular pucker (Retina attached under oil; diffuse atrophy; mild interval improvement in IRF / edema nasal macula).   Notes *Images captured and stored on drive  Diagnosis / Impression:  OD: Interval re-development of vitreous opacities / VH, Persistent fibrosis of nasal disc; macula and fovea stable QZ:RAQTMA attached under oil; diffuse atrophy; mild interval improvement in IRF / edema nasal macula   Clinical management:  See below  Abbreviations: NFP - Normal foveal profile. CME - cystoid macular edema. PED - pigment epithelial detachment. IRF - intraretinal fluid. SRF - subretinal fluid. EZ - ellipsoid zone. ERM - epiretinal membrane. ORA - outer retinal atrophy. ORT - outer retinal tubulation. SRHM - subretinal hyper-reflective material. IRHM - intraretinal hyper-reflective material      Intravitreal Injection, Pharmacologic Agent - OD - Right Eye  Time Out 02/12/2022. 10:28 AM. Confirmed correct patient, procedure, site, and patient consented.   Anesthesia Topical anesthesia was used. Anesthetic medications included Lidocaine 2%, Lidocaine 4%, Proparacaine 0.5%.   Procedure Preparation included 5% betadine to ocular surface, eyelid speculum. A supplied needle was used.   Injection: 1.25 mg Bevacizumab 1.25mg /0.42ml   Route: Intravitreal, Site: Right Eye   NDC: H061816, Lot: [id number] , Expiration date: 03/14/2022   Post-op Post injection exam found visual acuity of at least counting fingers. The patient tolerated the procedure well.  There were no complications. The patient received written and verbal post procedure care education. Post injection medications were not given.      Intravitreal Injection, Pharmacologic Agent - OS - Left Eye       Time Out 02/12/2022. 10:30 AM. Confirmed correct patient, procedure, site, and patient consented.   Anesthesia Topical anesthesia was used. Anesthetic medications included Lidocaine 2%, Proparacaine 0.5%.   Procedure Preparation included 5% betadine to ocular surface, eyelid speculum. A (32g) needle was used.   Injection: 1.25 mg Bevacizumab 1.25mg /0.64ml   Route: Intravitreal, Site: Left Eye   NDC: 80m, [id number], Expiration date: 03/15/2022   Post-op Post injection exam found visual acuity of at least counting fingers, no retinal detachment. The patient tolerated the procedure well. There were no complications. The patient received written and verbal post procedure care education. Post injection medications were not given.            ASSESSMENT/PLAN:    ICD-10-CM   1. Vitreous hemorrhage of right eye (HCC)  H43.11 OCT, Retina - OU - Both Eyes    2. Proliferative diabetic retinopathy of both eyes with macular edema associated with type 2 diabetes mellitus (HCC)  E11.3513 OCT, Retina - OU - Both Eyes    Intravitreal Injection, Pharmacologic Agent - OD - Right Eye    Intravitreal Injection, Pharmacologic Agent - OS - Left Eye    Bevacizumab (AVASTIN) SOLN 1.25 mg    Bevacizumab (AVASTIN) SOLN 1.25 mg    3. Vitreous hemorrhage of left eye (HCC)  H43.12     4. Traction detachment of left retina  H33.42     5. Retinal tear of right eye  H33.311     6. Essential hypertension  I10     7. Hypertensive retinopathy of both eyes  H35.033     8. Combined forms of age-related cataract of right eye  H25.811     9. Pseudophakia  Z96.1       VH OD - New floaters noticed on 12.23.22 exam - likely related to history of PDR and HTN  - s/p IVA OD #1  (12.23.22), #2 (01.27.23)  - today, interval increase in VH and BCVA down to 20/30 OD - recommend IVA OD #3 today, 02.24.23 for persistent/worse VH  - pt in agreement  - RBA of procedure discussed, questions answered - IVA informed consent obtained and signed, 12.23.22 (OD) - see procedure note - f/u 4 wks w/DFE/OCT/likely IVA OD  2-4. Proliferative diabetic retinopathy OU  - s/p PRP OD (09.19.22)  - s/p IVA OS #1 (01.27.23)             - OD: w/ +NVD  - OS: with chronic, diffuse VH and underlying TRD - exam shows +NVD and scattered fibrosis OD,              - s/p PPV/MP/EL/FAX/silicone oil (02.09.23 cs) OS 10.20.22             -  intra-op -- retina beneath chronic, dense VH was severely ischemic, florid fibrosis w/ +360 posterior TRD w/ stretch holes             - good oil fill, retina attached under oil; fibrosis improved, and good laser changes in place  - BCVA remains CF OS             - IOP good at 13,16  - OCT shows OD: Interval re-development of vitreous opacities / VH, Persistent fibrosis of nasal disc, macular and fovea stable; EY:CXKGYJ attached under oil; diffuse atrophy; mild interval improvement in IRF / edema nasal macula              - cont PF QID OS    Ketorolac QID OS             - avoid laying flat on back              - post op drop and positioning instructions reviewed             - Recommend IVA OS #2 today, 02.24.23 for persistent DME            - pt wishes to proceed  - RBA of procedure discussed, questions answered - informed consent obtained and signed - see procedure note             - F/u 4 wks w/DFE/OCT/poss. IVA OS  5. Retinal tear, OD - small flap tear located at 0130 equator, no SRF - lasered with PRP OD as above on 9.19.22  6,7. Hypertensive retinopathy OU - discussed importance of tight BP control - monitor   8. Mixed Cataract OD - The symptoms of cataract, surgical options, and treatments and risks were discussed with patient.  - discussed  diagnosis and progression - under the expert management of Dr. Wyatt Portela  9. Pseudophakia OS  - s/p CE/IOL (Dr. Katy Fitch)  - IOL in good position, doing well  - monitor   Ophthalmic Meds Ordered this visit:  Meds ordered this encounter  Medications   Bevacizumab (AVASTIN) SOLN 1.25 mg   Bevacizumab (AVASTIN) SOLN 1.25 mg     Return in about 4 weeks (around 03/12/2022) for f/u PDR OU, DFE, OCT.  There are no Patient Instructions on file for this visit.  This document serves as a record of services personally performed by Gardiner Sleeper, MD, PhD. It was created on their behalf by Leonie Douglas, an ophthalmic technician. The creation of this record is the provider's dictation and/or activities during the visit.    Electronically signed by: Leonie Douglas COA, 02/14/22  2:27 AM  This document serves as a record of services personally performed by Gardiner Sleeper, MD, PhD. It was created on their behalf by San Jetty. Owens Shark, OA an ophthalmic technician. The creation of this record is the provider's dictation and/or activities during the visit.    Electronically signed by: San Jetty. [place], [place] 02.24.2023 2:27 AM  Gardiner Sleeper, M.D., Ph.D. Diseases & Surgery of the Retina and Vitreous [facility]  I have reviewed the above documentation for accuracy and completeness, and I agree with the above. Gardiner Sleeper, M.D., Ph.D. 02/14/22 2:30 AM   Abbreviations: M myopia (nearsighted); A astigmatism; H hyperopia (farsighted); P presbyopia; Mrx spectacle prescription;  CTL contact lenses; OD right eye; OS left eye; OU both eyes  XT exotropia; ET esotropia; PEK punctate epithelial keratitis; PEE punctate epithelial erosions; DES dry eye  syndrome; MGD meibomian gland dysfunction; ATs artificial tears; PFAT's preservative free artificial tears; [place] nuclear sclerotic cataract; PSC posterior subcapsular cataract; ERM epi-retinal membrane; PVD posterior vitreous detachment; RD  retinal detachment; DM diabetes mellitus; DR diabetic retinopathy; NPDR non-proliferative diabetic retinopathy; PDR proliferative diabetic retinopathy; CSME clinically significant macular edema; DME diabetic macular edema; dbh dot blot hemorrhages; CWS cotton wool spot; POAG primary open angle glaucoma; C/D cup-to-disc ratio; HVF humphrey visual field; GVF goldmann visual field; OCT optical coherence tomography; IOP intraocular pressure; BRVO Branch retinal vein occlusion; CRVO central retinal vein occlusion; CRAO central retinal artery occlusion; BRAO branch retinal artery occlusion; RT retinal tear; SB scleral buckle; PPV pars plana vitrectomy; VH Vitreous hemorrhage; PRP panretinal laser photocoagulation; IVK intravitreal kenalog; VMT vitreomacular traction; MH Macular hole;  NVD neovascularization of the disc; NVE neovascularization elsewhere; AREDS age related eye disease study; ARMD age related macular degeneration; POAG primary open angle glaucoma; EBMD epithelial/anterior basement membrane dystrophy; ACIOL anterior chamber intraocular lens; IOL intraocular lens; PCIOL posterior chamber intraocular lens; Phaco/IOL phacoemulsification with intraocular lens placement; [place] photorefractive keratectomy; LASIK laser assisted in situ keratomileusis; HTN hypertension; DM diabetes mellitus; COPD chronic obstructive pulmonary disease

## 2022-02-08 ENCOUNTER — Other Ambulatory Visit (HOSPITAL_COMMUNITY): Payer: Self-pay

## 2022-02-08 ENCOUNTER — Other Ambulatory Visit (HOSPITAL_BASED_OUTPATIENT_CLINIC_OR_DEPARTMENT_OTHER): Payer: Self-pay

## 2022-02-08 MED ORDER — TRUEPLUS LANCETS 28G MISC: 3 refills | Status: AC

## 2022-02-08 MED ORDER — TRUE METRIX BLOOD GLUCOSE TEST VI STRP: ORAL_STRIP | 6 refills | Status: AC

## 2022-02-11 ENCOUNTER — Other Ambulatory Visit (HOSPITAL_COMMUNITY): Payer: Self-pay

## 2022-02-12 ENCOUNTER — Encounter (INDEPENDENT_AMBULATORY_CARE_PROVIDER_SITE_OTHER): Payer: Self-pay | Admitting: Ophthalmology

## 2022-02-12 ENCOUNTER — Ambulatory Visit (INDEPENDENT_AMBULATORY_CARE_PROVIDER_SITE_OTHER): Admitting: Ophthalmology

## 2022-02-12 ENCOUNTER — Other Ambulatory Visit: Payer: Self-pay

## 2022-02-12 DIAGNOSIS — E113513 Type 2 diabetes mellitus with proliferative diabetic retinopathy with macular edema, bilateral: Secondary | ICD-10-CM

## 2022-02-12 DIAGNOSIS — Z961 Presence of intraocular lens: Secondary | ICD-10-CM

## 2022-02-12 DIAGNOSIS — H35033 Hypertensive retinopathy, bilateral: Secondary | ICD-10-CM | POA: Diagnosis not present

## 2022-02-12 DIAGNOSIS — H3342 Traction detachment of retina, left eye: Secondary | ICD-10-CM

## 2022-02-12 DIAGNOSIS — H25811 Combined forms of age-related cataract, right eye: Secondary | ICD-10-CM

## 2022-02-12 DIAGNOSIS — H4312 Vitreous hemorrhage, left eye: Secondary | ICD-10-CM

## 2022-02-12 DIAGNOSIS — H33311 Horseshoe tear of retina without detachment, right eye: Secondary | ICD-10-CM | POA: Diagnosis not present

## 2022-02-12 DIAGNOSIS — H4313 Vitreous hemorrhage, bilateral: Secondary | ICD-10-CM

## 2022-02-12 DIAGNOSIS — I10 Essential (primary) hypertension: Secondary | ICD-10-CM | POA: Diagnosis not present

## 2022-02-12 DIAGNOSIS — E113593 Type 2 diabetes mellitus with proliferative diabetic retinopathy without macular edema, bilateral: Secondary | ICD-10-CM

## 2022-02-12 DIAGNOSIS — H4311 Vitreous hemorrhage, right eye: Secondary | ICD-10-CM

## 2022-02-14 DIAGNOSIS — E113513 Type 2 diabetes mellitus with proliferative diabetic retinopathy with macular edema, bilateral: Secondary | ICD-10-CM | POA: Diagnosis not present

## 2022-02-14 MED ORDER — BEVACIZUMAB CHEMO INJECTION 1.25MG/0.05ML SYRINGE FOR KALEIDOSCOPE
1.2500 mg | INTRAVITREAL | Status: AC | PRN
  Administered 2022-02-14: 1.25 mg via INTRAVITREAL

## 2022-02-18 ENCOUNTER — Other Ambulatory Visit: Payer: Self-pay

## 2022-02-19 ENCOUNTER — Other Ambulatory Visit: Payer: Self-pay

## 2022-02-22 ENCOUNTER — Other Ambulatory Visit: Payer: Self-pay

## 2022-02-25 ENCOUNTER — Other Ambulatory Visit (HOSPITAL_BASED_OUTPATIENT_CLINIC_OR_DEPARTMENT_OTHER): Payer: Self-pay

## 2022-03-01 ENCOUNTER — Other Ambulatory Visit (HOSPITAL_COMMUNITY): Payer: Self-pay

## 2022-03-01 ENCOUNTER — Other Ambulatory Visit: Payer: Self-pay

## 2022-03-01 MED ORDER — INSULIN ASPART 100 UNIT/ML FLEXPEN
PEN_INJECTOR | SUBCUTANEOUS | 6 refills | Status: DC
  Filled 2022-03-01: qty 3, 30d supply, fill #0

## 2022-03-01 MED ORDER — INSULIN GLARGINE 100 UNIT/ML ~~LOC~~ SOLN
SUBCUTANEOUS | 6 refills | Status: DC
  Filled 2022-03-01: qty 3, 13d supply, fill #0

## 2022-03-02 ENCOUNTER — Other Ambulatory Visit: Payer: Self-pay

## 2022-03-02 ENCOUNTER — Other Ambulatory Visit (HOSPITAL_BASED_OUTPATIENT_CLINIC_OR_DEPARTMENT_OTHER): Payer: Self-pay

## 2022-03-02 ENCOUNTER — Ambulatory Visit: Payer: MEDICAID | Admitting: Adult Health

## 2022-03-02 ENCOUNTER — Encounter: Payer: Self-pay | Admitting: Adult Health

## 2022-03-02 MED ORDER — INSULIN ASPART 100 UNIT/ML IJ SOLN
INTRAMUSCULAR | 6 refills | Status: DC
  Filled 2022-03-02: qty 10, fill #0

## 2022-03-02 MED ORDER — INSULIN GLARGINE 100 UNIT/ML ~~LOC~~ SOLN
SUBCUTANEOUS | 6 refills | Status: DC
  Filled 2022-03-02: qty 10, 28d supply, fill #0

## 2022-03-02 NOTE — Progress Notes (Deleted)
?[facility] ?[address] ?[place]. [place] [address] ?[phone] ? ?     STROKE FOLLOW UP NOTE ? ?[name] ?Date of Birth:  Dec 03, 1966 ?Medical Record Number:  [id number]  ? ?Reason for Referral: stroke follow up ? ? ? ?SUBJECTIVE: ? ? ?CHIEF COMPLAINT:  ?No chief complaint on file. ? ? ? ?HPI:  ? ?Update 03/02/2022 JM: 56 year old female with history of stroke returns for 34-monthfollow-up.  Overall stable from stroke standpoint without new stroke/TIA symptoms.  Chronic residual left hemiparesis stable without worsening.  Compliant on aspirin, Pradaxa and atorvastatin, denies side effects.  Blood pressure today ***.  Prior visit with PCP 05/2021 - she did not f/u as advised.  ? ? Unfortunately, she did suffer a fall on 2/5, evaluated in ED for left knee and foot pain and dx'd with a sprain as CT knee and x-rays negative for fracture and advised Ortho follow-up, returned on 2/7 with persistent pain and difficulty with ambulation. MR knee showed high-grade medial meniscus tearing, compartment osteoarthritis and large joint effusion -was referred to orthopedic surgeon and placed in knee immobilizer. ? ? ? ? ? ? ? ?History provided for reference purposes only ?Update 09/01/2021 JM: Mrs. VVannatterreturns for stroke follow-up after prior visit 6 months ago.  Completed PT/OT in 03/2021 reporting residual occasional imbalance and lightheadedness but overall improving.  Also continues to complain of fatigue.  Reports improvement of memory. She is ambulating without assistive device.  She did present to ED 03/09/2021 with blurred vision left eye and worsening left hand weakness. MR brain negative for acute stroke and all lab work stable compared to baseline. Continues to have limited vision of OS. Was seen by ophthalmologist Dr. GKaty Fitchand told "blood" in her eye (unable to personally view via epic) and referred to retina specialist with initial visit scheduled tomorrow. Also has occasional right eye  floaters. Denies new stroke/TIA symptoms since that time.  Compliant with aspirin and Pradaxa as well as atorvastatin.  Blood pressures today 123/71.  Recent A1c 8.0.  PCP increased Lantus dose as well as continuation of NovoLog and FIran  She was found to have LLE superficial clot on 06/14/2021 with a mechanical fall 8 days prior.  Hematology eval in ED without change to regimen with aspirin and Pradaxa.  She routinely follows with cardiology for atrial fibrillation and CHF. She has not yet been approved for medicaid as she is still pending disability.  No further concerns at this time. ? ?Initial visit 03/03/2021 JM: [name] being seen for hospital follow-up unaccompanied. ? ?Reports residual left sided weakness, left shoulder pain, lightheadedness/dizziness, occasional blurred vision (especialy when fatigued), short term memory loss and delayed word finding difficulty.  Also complains of excessive daytime fatigue ?Reported dizziness/lightheadedness appears to be more closely related to imbalance and disequilibrium -reports good days and bad days ?Use of cane outside of home otherwise no AD in the home ?Currently working with neuro rehab PT/OT ?Denies new stroke/TIA symptoms ? ?She was screened for sleep apnea during admission as part of sleep smart study where she was found to have sleep apnea but had difficulty tolerating full face mask ?She has not pursued outpatient sleep study as she is currently insured - currently medicaid pending  ? ?Compliant on aspirin and Pradaxa -denies side effects ?Compliant on atorvastatin 80 mg daily -denies side effects ?Blood pressure today 121/73 - monitors at home typically ranging 130s/80-90s ?Glucose levels stable typically 120s-130s occasionally fluctuates  ?Plans on f/u  with PCP next month for repeat lab work ? ?No further concerns at this time ? ? ?Stroke admission 12/24/2020 ?Adrienne Lane is a 56 y.o. female with PMHx of DM, Afib on Eliquis, CKD stage 4, HFpEF who  presented to [facility] ED on 12/24/2020 with floaters on OU in peripheral vision along with LUE & LLE weakness and left facial droop.  Personally reviewed hospitalization pertinent progress notes, lab work and imaging with summary provided.  Evaluated by Dr. Leonie Man with stroke work-up revealing right pontine lacunar infarct likely secondary from small vessel disease as well as additional small acute infarct in the left frontal subcortical white matter and small remote lacunar infarcts in the right corona radiata and left parietal white matter.  History of atrial fibrillation compliant on Eliquis and aspirin 81 mg daily with recommended continuation of aspirin 81 mg daily and transition Eliquis to Pradaxa 150 mg twice daily. Hx of HTN stable on amlodipine, carvedilol and hydralazine.  History of HLD on atorvastatin 40 mg daily with LDL 173 therefore increased dosage to 80 mg daily.  Uncontrolled DM with A1c 8.0.  Other stroke risk factors include obesity, CAD, OSA (evaluated by sleep smart trial) and CHF.  Evaluated by therapies who recommended discharge to CIR for ongoing therapy needs on 12/27/2020.  On 01/03/2021 during CIR admission, found to have COVID with low-grade fever, cough and sore throat therefore discharged to acute services for ongoing care and treatment.  She was eventually discharged home on 01/06/2021 (as all family members in household positive for COVID and mild symptoms) as she was unable to return back to CIR unless she had a negative Covid test (which could take 14-21 days).  ? ?Stroke - right pontine lacunar infarct likely from small vessel disease, the patient has a history of atrial fibrillation and was compliant with her Eliquis ?CT Head: chronic microvascular ischemic disease or age or indeterminate lacunar infarct, Additional age indeterminate lacunar, infarct within the right corona radiata, favored remote ?MRI : Acute infarct in the right hemipons. Additional small acute infarct in the left frontal  subcortical white matter. Small remote lacunar infarcts in the right corona radiata and left periatrial white matter. ?MR angio Head - neg ?Carotid US unremarkable ?2D Echo: EF 45 to 50%. ?LDL 173 ?HgbA1c 8.0 ?VTE prophylaxis - On pradaxa ?Eliquis (apixaban) daily prior to admission, now on ASA 81 and Pradaxa 150 mg BID. Continue on discharge ?Therapy recommendations:  CIR ?Disposition:  CIR ? ? ? ? ? ?ROS:   ?14 system review of systems performed and negative with exception of those listed in HPI ? ?PMH:  ?Past Medical History:  ?Diagnosis Date  ? Atrial fibrillation with RVR ([place])   ? Bigeminy 01/2020  ? Cataract   ? CHF (congestive heart failure) ([place]) 10/20/2019  ? CKD (chronic kidney disease), stage IV ([place])   ? Diabetes mellitus without complication ([place])   ? Diabetic retinopathy ([place])   ? DOE (dyspnea on exertion)   ? walking upstairs or up hill resolves in one minute  ? Dysrhythmia   ? a-fib - on pradaxa  ? Fibroids   ? History of kidney stones   ? History of recent blood transfusion 02/26/2020  ? Hypertension   ? Hypertensive retinopathy   ? Iron deficiency anemia   ? Non-ischemic cardiomyopathy ([place])   ? tachycardia induced  ? Obese   ? Peripheral edema   ? Premature ventricular contractions (PVCs) (VPCs)   ? Sleep apnea   ? does not use  cpap  ? Stroke [facility]) 12/2020  ? Umbilical hernia   ? Wears glasses   ? ? ?PSH:  ?Past Surgical History:  ?Procedure Laterality Date  ? CATARACT EXTRACTION    ? CHOLECYSTECTOMY    ? CYSTOSCOPY W/ URETERAL STENT PLACEMENT Bilateral 05/24/2020  ? Procedure: CYSTOSCOPY WITH RETROGRADE PYELOGRAM/URETERAL STENT PLACEMENT;  Surgeon: Robley Fries, MD;  [facility]: [facility];  Service: Urology;  Laterality: Bilateral;  ? CYSTOSCOPY/RETROGRADE/URETEROSCOPY Bilateral 04/03/2020  ? Procedure: CYSTOSCOPY/RETROGRADE/URETEROSCOPY;  Surgeon: Ardis Hughs, MD;  [facility];  Service: Urology;  Laterality: Bilateral;  ? EYE SURGERY Left 08/2021  ? cataract removed  ?  HYSTERECTOMY ABDOMINAL WITH SALPINGO-OOPHORECTOMY  07/21/2020  ? Procedure: HYSTERECTOMY ABDOMINAL WITH BILATERAL SALPINGO-OOPHORECTOMY;  Surgeon: Sanjuana Kava, MD;  Location: [place];  Service: Gynecology;;  ? IN

## 2022-03-05 NOTE — Progress Notes (Signed)
?[facility] Note ? ?03/12/2022 ? ?  ? ?CHIEF COMPLAINT ?Patient presents for Retina Follow Up ? ?HISTORY OF PRESENT ILLNESS: ?Adrienne Lane is a 56 y.o. female who presents to the clinic today for:  ?HPI   ? ? Retina Follow Up   ?Patient presents with  Diabetic Retinopathy.  In both eyes.  This started 4 weeks ago.  I, the attending physician,  performed the HPI with the patient and updated documentation appropriately. ? ?  ?  ? ? Comments   ?Patient here for 4 weeks retina follow up for PDR OU. Patient states vision doing pretty good. 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 Doesn't have foggy vision anymore. No eye pain. Added turmeric to medicines. ? ?  ?  ?Last edited by Bernarda Caffey, MD on 03/12/2022 12:57 PM.  ?  ? ?She states vision is improving OD, still using drops as directed  ? ?  ?Referring physician: ?[facility] ?[address] ?[place],  [place] [address] ? ?HISTORICAL INFORMATION:  ? ?Selected notes from the [place] ?Referred by Dr. Wyatt Portela for concern of VH  ? ?CURRENT MEDICATIONS: ?Current Outpatient Medications (Ophthalmic Drugs)  ?Medication Sig  ? ketorolac (ACULAR) 0.5 % ophthalmic solution Place 1 drop into the left eye 4 (four) times daily.  ? prednisoLONE acetate (PRED FORTE) 1 % ophthalmic suspension Place 1 drop into the left eye 4 (four) times daily.  ? Bromfenac Sodium (PROLENSA) 0.07 % SOLN Place 1 drop into the left eye 4 (four) times daily. (Patient not taking: Reported on 01/25/2022)  ? ?No current facility-administered medications for this visit. (Ophthalmic Drugs)  ? ?Current Outpatient Medications (Other)  ?Medication Sig  ? acetaminophen (TYLENOL) 500 MG tablet Take 500 mg by mouth every 6 (six) hours as needed for moderate pain or headache.  ? amLODipine (NORVASC) 10 MG tablet Take 1 tablet (10 mg total) by mouth daily.  ? aspirin EC 81 MG tablet Take 81 mg by mouth daily. Swallow whole.  ? atorvastatin (LIPITOR) 80 MG  tablet Take 1 tablet (80 mg total) by mouth daily. (Patient taking differently: Take 80 mg by mouth at bedtime.)  ? carvedilol (COREG) 6.25 MG tablet Take 1 tablet (6.25 mg total) by mouth 2 (two) times daily with a meal.  ? cholecalciferol (VITAMIN D3) 25 MCG (1000 UNIT) tablet Take 1,000 Units by mouth daily.  ? dapagliflozin propanediol (FARXIGA) 10 MG TABS tablet Take 1 tablet (10 mg total) by mouth daily before breakfast.  ? glucose blood (TRUE METRIX BLOOD GLUCOSE TEST) test strip Use to check fasting blood glucose 3 times daily  ? hydrALAZINE (APRESOLINE) 100 MG tablet Take 1 tablet (100 mg total) by mouth 3 (three) times daily.  ? insulin aspart (NOVOLOG) 100 UNIT/ML injection Use as directed using sliding scale three times daily.  ? insulin aspart (NOVOLOG) 100 UNIT/ML injection inject by subcutaneous route per prescriber's instructions. Insulin dosing requires individualization. for 30 days  ? insulin glargine (LANTUS) 100 UNIT/ML injection inject 22 by subcutaneous route as per insulin protocol for 30 days (Patient taking differently: Inject 22 Units into the skin daily.)  ? insulin glargine (LANTUS) 100 UNIT/ML injection inject 22 units subcutaneously for 30 days  ? insulin glargine (LANTUS) 100 UNIT/ML injection inject 22 by subcutaneous route as per insulin protocol for 30 days  ? Insulin Syringe-Needle U-100 (TRUEPLUS INSULIN SYRINGE) 30G X 5/16" 0.3 ML MISC USE AS DIRECTED 3 TIMES DAILY.  ?  Insulin Syringe-Needle U-100 30G X 5/16" 0.5 ML MISC Use as directed.  ? isosorbide mononitrate (IMDUR) 60 MG 24 hr tablet Take 1 tablet (60 mg total) by mouth daily.  ? Lancets (ONETOUCH ULTRASOFT) lancets Check CBG twice a day  ? mexiletine (MEXITIL) 150 MG capsule TAKE 2 CAPSULES (300 MG TOTAL) BY MOUTH EVERY 12 (TWELVE) HOURS. (Patient taking differently: Take 300 mg by mouth 2 (two) times daily.)  ? Multiple Vitamins-Minerals (WOMENS MULTI PO) Take 1 tablet by mouth daily.  ? Multiple Vitamins-Minerals (ZINC  PO) Take 1 tablet by mouth daily.  ? potassium chloride (KLOR-CON) 10 MEQ tablet Take 1 tablet (10 mEq total) by mouth daily.  ? potassium chloride SA (KLOR-CON M) 20 MEQ tablet Take 1 tablet (20 mEq total) by mouth daily.  ? sacubitril-valsartan (ENTRESTO) 24-26 MG Take 1 tablet by mouth 2 (two) times daily.  ? TRUEplus Lancets 28G MISC Use to check fasting blood glucose 3 times daily  ? dabigatran (PRADAXA) 150 MG CAPS capsule TAKE 1 CAPSULE (150 MG TOTAL) BY MOUTH EVERY 12 HOURS. (Patient taking differently: Take 150 mg by mouth 2 (two) times daily.)  ? oxyCODONE-acetaminophen (PERCOCET/ROXICET) 5-325 MG tablet Take 1-2 tablets by mouth every 6 (six) hours as needed for severe pain. (Patient not taking: Reported on 02/12/2022)  ? ?No current facility-administered medications for this visit. (Other)  ? ?REVIEW OF SYSTEMS: ?ROS   ?Positive for: Genitourinary, Endocrine, Cardiovascular, Eyes, Respiratory ?Negative for: Constitutional, Gastrointestinal, Neurological, Skin, Musculoskeletal, HENT, Psychiatric, Allergic/Imm, Heme/Lymph ?Last edited by Theodore Demark, COA on 03/12/2022  9:39 AM.  ?  ? ? ?ALLERGIES ?Allergies  ?Allergen Reactions  ? Adhesive [Tape]   ?  Tears skin, can tolerate paper tape  ? ?PAST MEDICAL HISTORY ?Past Medical History:  ?Diagnosis Date  ? Atrial fibrillation with RVR ([place])   ? Bigeminy 01/2020  ? Cataract   ? CHF (congestive heart failure) ([place]) 10/20/2019  ? CKD (chronic kidney disease), stage IV ([place])   ? Diabetes mellitus without complication ([place])   ? Diabetic retinopathy ([place])   ? DOE (dyspnea on exertion)   ? walking upstairs or up hill resolves in one minute  ? Dysrhythmia   ? a-fib - on pradaxa  ? Fibroids   ? History of kidney stones   ? History of recent blood transfusion 02/26/2020  ? Hypertension   ? Hypertensive retinopathy   ? Iron deficiency anemia   ? Non-ischemic cardiomyopathy ([place])   ? tachycardia induced  ? Obese   ? Peripheral edema   ? Premature ventricular  contractions (PVCs) (V[facility])   ? Sleep apnea   ? does not use cpap  ? Stroke [facility]) 12/2020  ? Umbilical hernia   ? Wears glasses   ? ?Past Surgical History:  ?Procedure Laterality Date  ? CATARACT EXTRACTION    ? CHOLECYSTECTOMY    ? CYSTOSCOPY W/ URETERAL STENT PLACEMENT Bilateral 05/24/2020  ? Procedure: CYSTOSCOPY WITH RETROGRADE PYELOGRAM/URETERAL STENT PLACEMENT;  Surgeon: Robley Fries, MD;  [facility];  Service: Urology;  Laterality: Bilateral;  ? CYSTOSCOPY/RETROGRADE/URETEROSCOPY Bilateral 04/03/2020  ? Procedure: CYSTOSCOPY/RETROGRADE/URETEROSCOPY;  Surgeon: Ardis Hughs, MD;  [facility];  Service: Urology;  Laterality: Bilateral;  ? EYE SURGERY Left 08/2021  ? cataract removed  ? HYSTERECTOMY ABDOMINAL WITH SALPINGO-OOPHORECTOMY  07/21/2020  ? Procedure: HYSTERECTOMY ABDOMINAL WITH BILATERAL SALPINGO-OOPHORECTOMY;  Surgeon: Sanjuana Kava, MD;  Location: [place];  Service: Gynecology;;  ? INJECTION OF SILICONE OIL Left 62/70/3500  ? Procedure: INJECTION OF  SILICONE OIL;  Surgeon: Bernarda Caffey, MD;  Location: [place];  Service: Ophthalmology;  Laterality: Left;  ? IR FLUORO GUIDE CV LINE RIGHT  02/21/2020  ? IR US GUIDE VASC ACCESS RIGHT  02/21/2020  ? MEMBRANE PEEL Left 10/08/2021  ? Procedure: MEMBRANE PEEL;  Surgeon: Bernarda Caffey, MD;  Location: [place];  Service: Ophthalmology;  Laterality: Left;  ? NEPHROLITHOTOMY Left 02/14/2020  ? Procedure: NEPHROLITHOTOMY PERCUTANEOUS/ SURGEON ACCESS/ LEFT PERCUTANEOUS NEPHROSTOMY TUBE PLACEMENT;  Surgeon: Ardis Hughs, MD;  [facility];  Service: Urology;  Laterality: Left;  ? NEPHROLITHOTOMY Left 02/21/2020  ? Procedure: NEPHROLITHOTOMY PERCUTANEOUS SECOND LOOK;  Surgeon: Ardis Hughs, MD;  [facility];  Service: Urology;  Laterality: Left;  ? NEPHROLITHOTOMY Left 02/26/2020  ? Procedure: NEPHROLITHOTOMY PERCUTANEOUS;  Surgeon: Ceasar Mons, MD;  [facility];  Service: Urology;  Laterality: Left;  NEED 150  MIN  ? NEPHROLITHOTOMY Right 03/24/2020  ? Procedure: NEPHROLITHOTOMY PERCUTANEOUS WITH ACCESS LEFT STENT REMOVAL;  Surgeon: Ardis Hughs, MD;  [facility];  Service: Urology;  Laterality: Right;  ?

## 2022-03-11 ENCOUNTER — Other Ambulatory Visit (HOSPITAL_COMMUNITY): Payer: Self-pay

## 2022-03-12 ENCOUNTER — Encounter (INDEPENDENT_AMBULATORY_CARE_PROVIDER_SITE_OTHER): Payer: Self-pay | Admitting: Ophthalmology

## 2022-03-12 ENCOUNTER — Ambulatory Visit (INDEPENDENT_AMBULATORY_CARE_PROVIDER_SITE_OTHER): Admitting: Ophthalmology

## 2022-03-12 ENCOUNTER — Other Ambulatory Visit: Payer: Self-pay

## 2022-03-12 DIAGNOSIS — H4312 Vitreous hemorrhage, left eye: Secondary | ICD-10-CM

## 2022-03-12 DIAGNOSIS — E113513 Type 2 diabetes mellitus with proliferative diabetic retinopathy with macular edema, bilateral: Secondary | ICD-10-CM | POA: Diagnosis not present

## 2022-03-12 DIAGNOSIS — H3342 Traction detachment of retina, left eye: Secondary | ICD-10-CM

## 2022-03-12 DIAGNOSIS — H33311 Horseshoe tear of retina without detachment, right eye: Secondary | ICD-10-CM | POA: Diagnosis not present

## 2022-03-12 DIAGNOSIS — H25811 Combined forms of age-related cataract, right eye: Secondary | ICD-10-CM

## 2022-03-12 DIAGNOSIS — H35033 Hypertensive retinopathy, bilateral: Secondary | ICD-10-CM

## 2022-03-12 DIAGNOSIS — H4313 Vitreous hemorrhage, bilateral: Secondary | ICD-10-CM

## 2022-03-12 DIAGNOSIS — Z961 Presence of intraocular lens: Secondary | ICD-10-CM

## 2022-03-12 DIAGNOSIS — I10 Essential (primary) hypertension: Secondary | ICD-10-CM | POA: Diagnosis not present

## 2022-03-12 DIAGNOSIS — H4311 Vitreous hemorrhage, right eye: Secondary | ICD-10-CM

## 2022-03-12 MED ORDER — BEVACIZUMAB CHEMO INJECTION 1.25MG/0.05ML SYRINGE FOR KALEIDOSCOPE
1.2500 mg | INTRAVITREAL | Status: AC | PRN
  Administered 2022-03-12: 1.25 mg via INTRAVITREAL

## 2022-03-22 ENCOUNTER — Other Ambulatory Visit: Payer: Self-pay

## 2022-03-22 ENCOUNTER — Other Ambulatory Visit (HOSPITAL_COMMUNITY): Payer: Self-pay

## 2022-03-24 ENCOUNTER — Other Ambulatory Visit: Payer: Self-pay

## 2022-03-25 ENCOUNTER — Other Ambulatory Visit (HOSPITAL_COMMUNITY): Payer: Self-pay

## 2022-03-26 ENCOUNTER — Other Ambulatory Visit: Payer: Self-pay

## 2022-04-01 DIAGNOSIS — R296 Repeated falls: Secondary | ICD-10-CM | POA: Insufficient documentation

## 2022-04-01 DIAGNOSIS — R2689 Other abnormalities of gait and mobility: Secondary | ICD-10-CM | POA: Insufficient documentation

## 2022-04-02 ENCOUNTER — Other Ambulatory Visit (HOSPITAL_BASED_OUTPATIENT_CLINIC_OR_DEPARTMENT_OTHER): Payer: Self-pay

## 2022-04-02 ENCOUNTER — Other Ambulatory Visit (HOSPITAL_COMMUNITY): Payer: Self-pay

## 2022-04-02 ENCOUNTER — Other Ambulatory Visit: Payer: Self-pay

## 2022-04-02 MED ORDER — NIFEDIPINE ER 60 MG PO TB24
ORAL_TABLET | ORAL | 0 refills | Status: DC
  Filled 2022-04-05: qty 30, 30d supply, fill #0

## 2022-04-05 ENCOUNTER — Other Ambulatory Visit: Payer: Self-pay

## 2022-04-05 ENCOUNTER — Other Ambulatory Visit (HOSPITAL_COMMUNITY): Payer: Self-pay | Admitting: Emergency Medicine

## 2022-04-05 ENCOUNTER — Other Ambulatory Visit (HOSPITAL_COMMUNITY): Payer: Self-pay

## 2022-04-05 ENCOUNTER — Other Ambulatory Visit (HOSPITAL_BASED_OUTPATIENT_CLINIC_OR_DEPARTMENT_OTHER): Payer: Self-pay

## 2022-04-05 MED ORDER — METRONIDAZOLE 500 MG PO TABS
ORAL_TABLET | ORAL | 0 refills | Status: DC
  Filled 2022-04-05: qty 14, 7d supply, fill #0

## 2022-04-07 ENCOUNTER — Other Ambulatory Visit (HOSPITAL_COMMUNITY): Payer: Self-pay

## 2022-04-08 ENCOUNTER — Other Ambulatory Visit (HOSPITAL_COMMUNITY): Payer: Self-pay

## 2022-04-08 NOTE — Progress Notes (Signed)
?[facility] Note ? ?04/09/2022 ? ?  ? ?CHIEF COMPLAINT ?Patient presents for Retina Follow Up ? ?HISTORY OF PRESENT ILLNESS: ?Adrienne Lane is a 56 y.o. female who presents to the clinic today for:  ?HPI   ? ? Retina Follow Up   ?Patient presents with  Diabetic Retinopathy (PDR OU IVA 2.24.23 ?VH OD ?RT OD  ?HTN RET).  In both eyes.  This started months ago.  Duration of 4 weeks.  Since onset it is stable.  I, the attending physician,  performed the HPI with the patient and updated documentation appropriately. ? ?  ?  ? ? Comments   ?Patient feels that the vision has remained stable since her last visit 4 weeks ago. She feels that the floaters and haziness has gotten better. Her blood sugar was 134 this morning. Her A1C is 7.7.  ? ?  ?  ?Last edited by Bernarda Caffey, MD on 04/10/2022  2:06 AM.  ?  ? ?Referring physician: ?[facility] ?[address] ?[place],  [place] [id number] ? ?HISTORICAL INFORMATION:  ? ?Selected notes from the [facility] ?Referred by Dr. Wyatt Portela for concern of VH  ? ?CURRENT MEDICATIONS: ?Current Outpatient Medications (Ophthalmic Drugs)  ?Medication Sig  ? Bromfenac Sodium (PROLENSA) 0.07 % SOLN Place 1 drop into the left eye 4 (four) times daily. (Patient not taking: Reported on 01/25/2022)  ? ketorolac (ACULAR) 0.5 % ophthalmic solution Place 1 drop into the left eye 4 (four) times daily.  ? prednisoLONE acetate (PRED FORTE) 1 % ophthalmic suspension Place 1 drop into the left eye 4 (four) times daily.  ? ?No current facility-administered medications for this visit. (Ophthalmic Drugs)  ? ?Current Outpatient Medications (Other)  ?Medication Sig  ? acetaminophen (TYLENOL) 500 MG tablet Take 500 mg by mouth every 6 (six) hours as needed for moderate pain or headache. (Patient not taking: Reported on 04/09/2022)  ? amLODipine (NORVASC) 10 MG tablet Take 1 tablet (10 mg total) by mouth daily.  ? aspirin EC 81 MG tablet Take 81 mg  by mouth daily. Swallow whole.  ? atorvastatin (LIPITOR) 80 MG tablet Take 1 tablet (80 mg total) by mouth daily. (Patient taking differently: Take 80 mg by mouth at bedtime.)  ? carvedilol (COREG) 6.25 MG tablet Take 1 tablet (6.25 mg total) by mouth 2 (two) times daily with a meal.  ? cholecalciferol (VITAMIN D3) 25 MCG (1000 UNIT) tablet Take 1,000 Units by mouth daily.  ? dabigatran (PRADAXA) 150 MG CAPS capsule TAKE 1 CAPSULE (150 MG TOTAL) BY MOUTH EVERY 12 HOURS. (Patient taking differently: Take 150 mg by mouth 2 (two) times daily.)  ? dapagliflozin propanediol (FARXIGA) 10 MG TABS tablet Take 1 tablet (10 mg total) by mouth daily before breakfast.  ? glucose blood (TRUE METRIX BLOOD GLUCOSE TEST) test strip Use to check fasting blood glucose 3 times daily  ? hydrALAZINE (APRESOLINE) 100 MG tablet Take 1 tablet (100 mg total) by mouth 3 (three) times daily.  ? insulin aspart (NOVOLOG) 100 UNIT/ML injection Use as directed using sliding scale three times daily.  ? insulin aspart (NOVOLOG) 100 UNIT/ML injection inject by subcutaneous route per prescriber's instructions. Insulin dosing requires individualization. for 30 days  ? insulin glargine (LANTUS) 100 UNIT/ML injection inject 22 by subcutaneous route as per insulin protocol for 30 days (Patient taking differently: Inject 22 Units into the skin daily.)  ? insulin glargine (LANTUS) 100 UNIT/ML injection inject  22 units subcutaneously for 30 days  ? insulin glargine (LANTUS) 100 UNIT/ML injection inject 22 by subcutaneous route as per insulin protocol for 30 days  ? Insulin Syringe-Needle U-100 (TRUEPLUS INSULIN SYRINGE) 30G X 5/16" 0.3 ML MISC USE AS DIRECTED 3 TIMES DAILY.  ? Insulin Syringe-Needle U-100 30G X 5/16" 0.5 ML MISC Use as directed.  ? isosorbide mononitrate (IMDUR) 60 MG 24 hr tablet Take 1 tablet (60 mg total) by mouth daily.  ? Lancets (ONETOUCH ULTRASOFT) lancets Check CBG twice a day  ? metroNIDAZOLE (FLAGYL) 500 MG tablet Take 1 tablet  (500 mg) by mouth every 12 hours for 7 days  ? metroNIDAZOLE (FLAGYL) 500 MG tablet take 1 tablet (500 mg) by oral route every 12 hours for 7 days  ? mexiletine (MEXITIL) 150 MG capsule TAKE 2 CAPSULES (300 MG TOTAL) BY MOUTH EVERY 12 (TWELVE) HOURS. (Patient taking differently: Take 300 mg by mouth 2 (two) times daily.)  ? Multiple Vitamins-Minerals (WOMENS MULTI PO) Take 1 tablet by mouth daily.  ? Multiple Vitamins-Minerals (ZINC PO) Take 1 tablet by mouth daily.  ? NIFEdipine (ADALAT CC) 60 MG 24 hr tablet take 1 tablet (60 mg) by oral route once daily for 30 days  ? oxyCODONE-acetaminophen (PERCOCET/ROXICET) 5-325 MG tablet Take 1-2 tablets by mouth every 6 (six) hours as needed for severe pain. (Patient not taking: Reported on 02/12/2022)  ? potassium chloride (KLOR-CON) 10 MEQ tablet Take 1 tablet (10 mEq total) by mouth daily.  ? potassium chloride SA (KLOR-CON M) 20 MEQ tablet Take 1 tablet (20 mEq total) by mouth daily.  ? sacubitril-valsartan (ENTRESTO) 24-26 MG Take 1 tablet by mouth 2 (two) times daily.  ? TRUEplus Lancets 28G MISC Use to check fasting blood glucose 3 times daily  ? ?No current facility-administered medications for this visit. (Other)  ? ?REVIEW OF SYSTEMS: ?ROS   ?Positive for: Genitourinary, Endocrine, Cardiovascular, Eyes, Respiratory ?Negative for: Constitutional, Gastrointestinal, Neurological, Skin, Musculoskeletal, HENT, Psychiatric, Allergic/Imm, Heme/Lymph ?Last edited by Annie Paras, COT on 04/09/2022  9:24 AM.  ?  ? ?ALLERGIES ?Allergies  ?Allergen Reactions  ? Adhesive [Tape]   ?  Tears skin, can tolerate paper tape  ? ?PAST MEDICAL HISTORY ?Past Medical History:  ?Diagnosis Date  ? Atrial fibrillation with RVR ([place])   ? [place] 01/2020  ? Cataract   ? CHF (congestive heart failure) ([place]) 10/20/2019  ? CKD (chronic kidney disease), stage IV ([place])   ? Diabetes mellitus without complication ([place])   ? Diabetic retinopathy ([place])   ? DOE (dyspnea on exertion)   ? walking  upstairs or up hill resolves in one minute  ? Dysrhythmia   ? a-fib - on pradaxa  ? Fibroids   ? History of kidney stones   ? History of recent blood transfusion 02/26/2020  ? Hypertension   ? Hypertensive retinopathy   ? Iron deficiency anemia   ? Non-ischemic cardiomyopathy ([place])   ? tachycardia induced  ? Obese   ? Peripheral edema   ? Premature ventricular contractions (PVCs) ([facility])   ? Sleep apnea   ? does not use cpap  ? Stroke [facility]) 12/2020  ? Umbilical hernia   ? Wears glasses   ? ?Past Surgical History:  ?Procedure Laterality Date  ? CATARACT EXTRACTION    ? CHOLECYSTECTOMY    ? CYSTOSCOPY W/ URETERAL STENT PLACEMENT Bilateral 05/24/2020  ? Procedure: CYSTOSCOPY WITH RETROGRADE PYELOGRAM/URETERAL STENT PLACEMENT;  Surgeon: Robley Fries, MD;  Location: [facility];  Service:  Urology;  Laterality: Bilateral;  ? CYSTOSCOPY/RETROGRADE/URETEROSCOPY Bilateral 04/03/2020  ? Procedure: CYSTOSCOPY/RETROGRADE/URETEROSCOPY;  Surgeon: Ardis Hughs, MD;  Location: [facility];  Service: Urology;  Laterality: Bilateral;  ? EYE SURGERY Left 08/2021  ? cataract removed  ? HYSTERECTOMY ABDOMINAL WITH SALPINGO-OOPHORECTOMY  07/21/2020  ? Procedure: HYSTERECTOMY ABDOMINAL WITH BILATERAL SALPINGO-OOPHORECTOMY;  Surgeon: Sanjuana Kava, MD;  Location: [facility];  Service: Gynecology;;  ? INJECTION OF SILICONE OIL Left 72/55/0016  ? Procedure: INJECTION OF SILICONE OIL;  Surgeon: Bernarda Caffey, MD;  Location: [facility];  Service: Ophthalmology;  Laterality: Left;  ? IR FLUORO GUIDE CV LINE RIGHT  02/21/2020  ? IR US GUIDE VASC ACCESS RIGHT  02/21/2020  ? MEMBRANE PEEL Left 10/08/2021  ? Procedure: MEMBRANE PEEL;  Surgeon: Bernarda Caffey, MD;  Location: [facility];  Service: Ophthalmology;  Laterality: Left;  ? NEPHROLITHOTOMY Left 02/14/2020  ? Procedure: NEPHROLITHOTOMY PERCUTANEOUS/ SURGEON ACCESS/ LEFT PERCUTANEOUS NEPHROSTOMY TUBE PLACEMENT;  Surgeon: Ardis Hughs, MD;  Location: [facility];  Service: Urology;  Laterality: Left;  ?  NEPHROLITHOTOMY Left 02/21/2020  ? Procedure: NEPHROLITHOTOMY PERCUTANEOUS SECOND LOOK;  Surgeon: Ardis Hughs, MD;  Location: [facility];  Service: Urology;  Laterality: Left;  ? NEPHROLITHOTOMY Left 0

## 2022-04-09 ENCOUNTER — Encounter (INDEPENDENT_AMBULATORY_CARE_PROVIDER_SITE_OTHER): Payer: Self-pay | Admitting: Ophthalmology

## 2022-04-09 ENCOUNTER — Ambulatory Visit (INDEPENDENT_AMBULATORY_CARE_PROVIDER_SITE_OTHER): Admitting: Ophthalmology

## 2022-04-09 DIAGNOSIS — H4312 Vitreous hemorrhage, left eye: Secondary | ICD-10-CM | POA: Diagnosis not present

## 2022-04-09 DIAGNOSIS — E113513 Type 2 diabetes mellitus with proliferative diabetic retinopathy with macular edema, bilateral: Secondary | ICD-10-CM

## 2022-04-09 DIAGNOSIS — H3342 Traction detachment of retina, left eye: Secondary | ICD-10-CM | POA: Diagnosis not present

## 2022-04-09 DIAGNOSIS — Z961 Presence of intraocular lens: Secondary | ICD-10-CM

## 2022-04-09 DIAGNOSIS — H33311 Horseshoe tear of retina without detachment, right eye: Secondary | ICD-10-CM

## 2022-04-09 DIAGNOSIS — I10 Essential (primary) hypertension: Secondary | ICD-10-CM

## 2022-04-09 DIAGNOSIS — H4311 Vitreous hemorrhage, right eye: Secondary | ICD-10-CM

## 2022-04-09 DIAGNOSIS — H35033 Hypertensive retinopathy, bilateral: Secondary | ICD-10-CM | POA: Diagnosis not present

## 2022-04-09 DIAGNOSIS — H25811 Combined forms of age-related cataract, right eye: Secondary | ICD-10-CM

## 2022-04-10 ENCOUNTER — Encounter (INDEPENDENT_AMBULATORY_CARE_PROVIDER_SITE_OTHER): Payer: Self-pay | Admitting: Ophthalmology

## 2022-04-10 MED ORDER — BEVACIZUMAB CHEMO INJECTION 1.25MG/0.05ML SYRINGE FOR KALEIDOSCOPE
1.2500 mg | INTRAVITREAL | Status: AC | PRN
  Administered 2022-04-10: 1.25 mg via INTRAVITREAL

## 2022-04-12 ENCOUNTER — Other Ambulatory Visit (HOSPITAL_COMMUNITY): Payer: Self-pay

## 2022-04-12 ENCOUNTER — Other Ambulatory Visit: Payer: Self-pay

## 2022-04-16 ENCOUNTER — Other Ambulatory Visit: Payer: Self-pay

## 2022-04-16 ENCOUNTER — Other Ambulatory Visit (HOSPITAL_COMMUNITY): Payer: Self-pay

## 2022-04-16 ENCOUNTER — Other Ambulatory Visit (HOSPITAL_BASED_OUTPATIENT_CLINIC_OR_DEPARTMENT_OTHER): Payer: Self-pay

## 2022-04-16 MED ORDER — INSULIN GLARGINE 100 UNIT/ML ~~LOC~~ SOLN
SUBCUTANEOUS | 6 refills | Status: DC
  Filled 2022-04-16: qty 10, 30d supply, fill #0
  Filled 2022-06-07: qty 10, 30d supply, fill #1
  Filled 2022-06-07: qty 10, 28d supply, fill #1

## 2022-04-20 ENCOUNTER — Other Ambulatory Visit: Payer: Self-pay

## 2022-04-22 ENCOUNTER — Other Ambulatory Visit (HOSPITAL_COMMUNITY): Payer: Self-pay | Admitting: *Deleted

## 2022-04-22 ENCOUNTER — Other Ambulatory Visit (HOSPITAL_COMMUNITY): Payer: Self-pay

## 2022-04-22 DIAGNOSIS — I10 Essential (primary) hypertension: Secondary | ICD-10-CM

## 2022-04-22 MED ORDER — POTASSIUM CHLORIDE ER 10 MEQ PO TBCR
10.0000 meq | EXTENDED_RELEASE_TABLET | Freq: Every day | ORAL | 6 refills | Status: DC
  Filled 2022-04-22 – 2022-05-04 (×2): qty 30, 30d supply, fill #0
  Filled 2022-06-07: qty 30, 30d supply, fill #1

## 2022-04-29 ENCOUNTER — Other Ambulatory Visit: Payer: Self-pay

## 2022-04-30 ENCOUNTER — Other Ambulatory Visit: Payer: Self-pay

## 2022-04-30 ENCOUNTER — Telehealth (HOSPITAL_COMMUNITY): Payer: Self-pay | Admitting: Pharmacy Technician

## 2022-04-30 ENCOUNTER — Other Ambulatory Visit (HOSPITAL_COMMUNITY): Payer: Self-pay

## 2022-04-30 NOTE — Telephone Encounter (Signed)
Advanced Heart Failure Patient Advocate Encounter ? ?Patient called in requesting help with renewing Pradaxa patient assistance through [facility]. The company has taken this medication off of their covered list. We cannot renew this assistance.  ? ?Called and updated the patient. Advised her that I would reach out to [facility] on her behalf to see if it is possible to get the medication through her visits there. Sent Cheryle Horsfall (CPhT) a message with this inquiry. ? ?Charlann Boxer, CPhT ? ?

## 2022-05-03 NOTE — Telephone Encounter (Signed)
Claiborne Billings (CPhT) with [facility] stated that is not a medication that they can provide a discount for. Sent the provider a message regarding switching the medication to either Xarelto or Warfarin. Called and spoke with the patient. Will call back once advised on which medication to proceed with. ? ?Charlann Boxer, CPhT ? ?

## 2022-05-03 NOTE — Progress Notes (Addendum)
[facility] Note  05/07/2022     CHIEF COMPLAINT Patient presents for Retina Follow Up  HISTORY OF PRESENT ILLNESS: Adrienne Lane is a 56 y.o. female who presents to the clinic today for:  HPI     Retina Follow Up   Patient presents with  Other.  In right eye.  Severity is moderate.  Duration of 4 weeks.  Since onset it is stable.  I, the attending physician,  performed the HPI with the patient and updated documentation appropriately.        Comments   Pt here for 4 wk f/u VH OD, PDR OU. Pt states VA is the same. No change.       Last edited by Bernarda Caffey, MD on 05/10/2022  1:35 PM.    Pt not feeling well this morning, nauseous and light headed, BP was 106/66 and blood sugar was 42, pt states she has foggy vision  Referring physician: [facility] [place],  [place] [address]  HISTORICAL INFORMATION:   Selected notes from the MEDICAL RECORD NUMBER Referred by Dr. Wyatt Portela for concern of VH   CURRENT MEDICATIONS: Current Outpatient Medications (Ophthalmic Drugs)  Medication Sig   Bromfenac Sodium (PROLENSA) 0.07 % SOLN Place 1 drop into the left eye 4 (four) times daily.   ketorolac (ACULAR) 0.5 % ophthalmic solution Place 1 drop into the left eye 4 (four) times daily.   prednisoLONE acetate (PRED FORTE) 1 % ophthalmic suspension Place 1 drop into the left eye 4 (four) times daily.   No current facility-administered medications for this visit. (Ophthalmic Drugs)   Current Outpatient Medications (Other)  Medication Sig   acetaminophen (TYLENOL) 500 MG tablet Take 500 mg by mouth every 6 (six) hours as needed for moderate pain or headache.   amLODipine (NORVASC) 10 MG tablet Take 1 tablet (10 mg total) by mouth daily.   aspirin EC 81 MG tablet Take 81 mg by mouth daily. Swallow whole.   atorvastatin (LIPITOR) 80 MG tablet Take 1 tablet (80 mg total) by mouth daily. (Patient taking differently:  Take 80 mg by mouth at bedtime.)   carvedilol (COREG) 6.25 MG tablet Take 1 tablet (6.25 mg total) by mouth 2 (two) times daily with a meal.   cholecalciferol (VITAMIN D3) 25 MCG (1000 UNIT) tablet Take 1,000 Units by mouth daily.   dapagliflozin propanediol (FARXIGA) 10 MG TABS tablet Take 1 tablet (10 mg total) by mouth daily before breakfast.   glucose blood (TRUE METRIX BLOOD GLUCOSE TEST) test strip Use to check fasting blood glucose 3 times daily   hydrALAZINE (APRESOLINE) 100 MG tablet Take 1 tablet (100 mg total) by mouth 3 (three) times daily.   insulin aspart (NOVOLOG) 100 UNIT/ML injection Use as directed using sliding scale three times daily.   insulin aspart (NOVOLOG) 100 UNIT/ML injection inject by subcutaneous route per prescriber's instructions. Insulin dosing requires individualization. for 30 days   insulin glargine (LANTUS) 100 UNIT/ML injection inject 22 by subcutaneous route as per insulin protocol for 30 days (Patient taking differently: Inject 22 Units into the skin daily.)   insulin glargine (LANTUS) 100 UNIT/ML injection inject 22 units subcutaneously for 30 days   insulin glargine (LANTUS) 100 UNIT/ML injection inject 22 by subcutaneous route as per insulin protocol for 30 days   insulin glargine (LANTUS) 100 UNIT/ML injection inject 25 by subcutaneous route as per insulin protocol for 30 days   Insulin Syringe-Needle  U-100 (TRUEPLUS INSULIN SYRINGE) 30G X 5/16" 0.3 ML MISC USE AS DIRECTED 3 TIMES DAILY.   Insulin Syringe-Needle U-100 30G X 5/16" 0.5 ML MISC Use as directed.   isosorbide mononitrate (IMDUR) 60 MG 24 hr tablet Take 1 tablet (60 mg total) by mouth daily.   Lancets (ONETOUCH ULTRASOFT) lancets Check CBG twice a day   metroNIDAZOLE (FLAGYL) 500 MG tablet Take 1 tablet (500 mg) by mouth every 12 hours for 7 days   metroNIDAZOLE (FLAGYL) 500 MG tablet take 1 tablet (500 mg) by oral route every 12 hours for 7 days   mexiletine (MEXITIL) 150 MG capsule TAKE 2  CAPSULES (300 MG TOTAL) BY MOUTH EVERY 12 (TWELVE) HOURS. (Patient taking differently: Take 300 mg by mouth 2 (two) times daily.)   Multiple Vitamins-Minerals (WOMENS MULTI PO) Take 1 tablet by mouth daily.   Multiple Vitamins-Minerals (ZINC PO) Take 1 tablet by mouth daily.   NIFEdipine (ADALAT CC) 60 MG 24 hr tablet take 1 tablet (60 mg) by oral route once daily for 30 days   oxyCODONE-acetaminophen (PERCOCET/ROXICET) 5-325 MG tablet Take 1-2 tablets by mouth every 6 (six) hours as needed for severe pain.   potassium chloride (KLOR-CON) 10 MEQ tablet Take 1 tablet (10 mEq total) by mouth daily.   potassium chloride SA (KLOR-CON M) 20 MEQ tablet Take 1 tablet (20 mEq total) by mouth daily.   rivaroxaban (XARELTO) 20 MG TABS tablet Take 1 tablet (20 mg total) by mouth daily with supper.   sacubitril-valsartan (ENTRESTO) 24-26 MG Take 1 tablet by mouth 2 (two) times daily.   TRUEplus Lancets 28G MISC Use to check fasting blood glucose 3 times daily   No current facility-administered medications for this visit. (Other)   REVIEW OF SYSTEMS: ROS   Positive for: Genitourinary, Endocrine, Cardiovascular, Eyes, Respiratory Negative for: Constitutional, Gastrointestinal, Neurological, Skin, Musculoskeletal, HENT, Psychiatric, Allergic/Imm, Heme/Lymph Last edited by Kingsley Spittle, COT on 05/07/2022  9:54 AM.     ALLERGIES Allergies  Allergen Reactions   Adhesive [Tape]     Tears skin, can tolerate paper tape   PAST MEDICAL HISTORY Past Medical History:  Diagnosis Date   Atrial fibrillation with RVR ([place])    Bigeminy 01/2020   Cataract    CHF (congestive heart failure) ([place]) 10/20/2019   CKD (chronic kidney disease), stage IV (HCC)    Diabetes mellitus without complication ([place])    Diabetic retinopathy ([place])    DOE (dyspnea on exertion)    walking upstairs or up hill resolves in one minute   Dysrhythmia    a-fib - on pradaxa   Fibroids    History of kidney stones    History of  recent blood transfusion 02/26/2020   Hypertension    Hypertensive retinopathy    Iron deficiency anemia    Non-ischemic cardiomyopathy ([place])    tachycardia induced   Obese    Peripheral edema    Premature ventricular contractions (PVCs) ([facility])    Sleep apnea    does not use cpap   Stroke ([place]) 40/9735   Umbilical hernia    Wears glasses    Past Surgical History:  Procedure Laterality Date   CATARACT EXTRACTION     CHOLECYSTECTOMY     CYSTOSCOPY W/ URETERAL STENT PLACEMENT Bilateral 05/24/2020   Procedure: CYSTOSCOPY WITH RETROGRADE PYELOGRAM/URETERAL STENT PLACEMENT;  Surgeon: Robley Fries, MD;  Location: [facility];  Service: Urology;  Laterality: Bilateral;   CYSTOSCOPY/RETROGRADE/URETEROSCOPY Bilateral 04/03/2020   Procedure: CYSTOSCOPY/RETROGRADE/URETEROSCOPY;  Surgeon: Louis Meckel  W, MD;  Location: [facility];  Service: Urology;  Laterality: Bilateral;   EYE SURGERY Left 08/2021   cataract removed   HYSTERECTOMY ABDOMINAL WITH SALPINGO-OOPHORECTOMY  07/21/2020   Procedure: HYSTERECTOMY ABDOMINAL WITH BILATERAL SALPINGO-OOPHORECTOMY;  Surgeon: Sanjuana Kava, MD;  Location: [facility];  Service: Gynecology;;   INJECTION OF SILICONE OIL Left 35/46/5681   Procedure: INJECTION OF SILICONE OIL;  Surgeon: Bernarda Caffey, MD;  Location: [place];  Service: Ophthalmology;  Laterality: Left;   IR FLUORO GUIDE CV LINE RIGHT  02/21/2020   IR US GUIDE VASC ACCESS RIGHT  02/21/2020   MEMBRANE PEEL Left 10/08/2021   Procedure: MEMBRANE PEEL;  Surgeon: Bernarda Caffey, MD;  Location: [facility];  Service: Ophthalmology;  Laterality: Left;   NEPHROLITHOTOMY Left 02/14/2020   Procedure: NEPHROLITHOTOMY PERCUTANEOUS/ SURGEON ACCESS/ LEFT PERCUTANEOUS NEPHROSTOMY TUBE PLACEMENT;  Surgeon: Ardis Hughs, MD;  Location: [facility];  Service: Urology;  Laterality: Left;   NEPHROLITHOTOMY Left 02/21/2020   Procedure: NEPHROLITHOTOMY PERCUTANEOUS SECOND LOOK;  Surgeon: Ardis Hughs, MD;  Location: [facility];   Service: Urology;  Laterality: Left;   NEPHROLITHOTOMY Left 02/26/2020   Procedure: NEPHROLITHOTOMY PERCUTANEOUS;  Surgeon: Ceasar Mons, MD;  Location: [facility];  Service: Urology;  Laterality: Left;  NEED 150 MIN   NEPHROLITHOTOMY Right 03/24/2020   Procedure: NEPHROLITHOTOMY PERCUTANEOUS WITH ACCESS LEFT STENT REMOVAL;  Surgeon: Ardis Hughs, MD;  Location[facility];  Service: Urology;  Laterality: Right;   PARS PLANA VITRECTOMY Left 10/08/2021   Procedure: PARS PLANA VITRECTOMY WITH 25 GAUGE;  Surgeon: Bernarda Caffey, MD;  Location: [facility];  Service: Ophthalmology;  Laterality: Left;   PHOTOCOAGULATION WITH LASER Left 10/08/2021   Procedure: PHOTOCOAGULATION WITH LASER;  Surgeon: Bernarda Caffey, MD;  Location: [facility];  Service: Ophthalmology;  Laterality: Left;   RIGHT HEART CATH N/A 11/09/2019   Procedure: RIGHT HEART CATH;  Surgeon: Jolaine Artist, MD;  Location: [facility];  Service: Cardiovascular;  Laterality: N/A;   WISDOM TOOTH EXTRACTION     FAMILY HISTORY Family History  Problem Relation Age of Onset   Atrial fibrillation Mother    Hypertension Mother    Stroke Mother 62   Diabetes Mother    Diabetes Father    Hypertension Father    Diabetes Sister    Hypertension Sister    Diabetes Brother    Hypertension Brother    Diabetes Brother    Heart attack Brother    Stroke Maternal Grandmother 21   SOCIAL HISTORY Social History   Tobacco Use   Smoking status: Never   Smokeless tobacco: Never  Vaping Use   Vaping Use: Never used  Substance Use Topics   Alcohol use: Never   Drug use: Never       OPHTHALMIC EXAM:  Base Eye Exam     Visual Acuity (Snellen - Linear)       Right Left   Dist cc 20/25 -2 CF at 3'   Dist ph cc NI NI    Correction: Glasses         Tonometry (Tonopen, 10:01 AM)       Right Left   Pressure 10 14         Pupils       Dark Light Shape React APD   Right 3 2 Round Brisk None   Left 3 2 Round  Brisk +1         Visual Fields       Left Right  Restrictions Partial outer superior temporal, inferior temporal, superior nasal, inferior nasal deficiencies          Extraocular Movement       Right Left    Full, Ortho Full, Ortho         Neuro/Psych     Oriented x3: Yes   Mood/Affect: Normal         Dilation     Both eyes: 1.0% Mydriacyl, 2.5% Phenylephrine @ 10:01 AM           Slit Lamp and Fundus Exam     External Exam       Right Left   External Normal Normal         Slit Lamp Exam       Right Left   Lids/Lashes Dermatochalasis - upper lid Dermatochalasis - upper lid   Conjunctiva/Sclera Melanosis sutures dissolved, mild melanosis   Cornea mild arcus, trace PEE mild arcus, well healed cataract wound, 1+ Punctate epithelial erosions   Anterior Chamber Deep and clear Deep and quiet, 1-2+ cell and pigment   Iris Round and dilated, No NVI Round and dilated, No NVI   Lens 1-2+ Nuclear sclerosis, 1-2+ Cortical cataract, fine pigment deposition on posterior capsule PC IOL in good position, pigment deposition on optic, 1-2+PCO   Anterior Vitreous Vitreous syneresis, +RBC, blood stained vitreous condensations - slightly increased post vitrectomy, good oil fill, +pigment         Fundus Exam       Right Left   Disc hazy view, perfused 1+pallor, sharp nasal rim, +temporal edema - improved   C/D Ratio 0.5 0.2   Macula hazy view, grossly flat Flat under oil, fibrosis improved, scattered IRH/DBH, residual fibrosis and thickening nasal macula -- slightly improved   Vessels attenuated, Tortuous, peripheral sclerosis and fibrosis Severe attenuation / sclerosis, +fibrosis improved, tortuosity   Periphery Attached, scattered patches of fibrosis and NVE, small flap tear at 0130 equator--no SRF, good laser surrounding, old VH settled inferiorly - improving; PRP laser changes Attached, Scattered DBH; good PRP changes 360; scattered fibrosis -- improved            IMAGING AND PROCEDURES  Imaging and Procedures for 05/07/2022  OCT, Retina - OU - Both Eyes       Right Eye Quality was good. Central Foveal Thickness: 220. Progression has improved. Findings include normal foveal contour, no IRF, no SRF, intraretinal hyper-reflective material (Persistent vitreous opacities / VH, Persistent fibrosis of nasal disc; macula and fovea stable).   Left Eye Quality was good. Central Foveal Thickness: 360. Progression has improved. Findings include abnormal foveal contour, intraretinal fluid, no SRF, outer retinal atrophy, inner retinal atrophy, macular pucker (Retina attached under oil; diffuse atrophy; mild interval improvement in IRF / edema nasal macula).   Notes *Images captured and stored on drive  Diagnosis / Impression:  OD: Persistent vitreous opacities / VH, Persistent fibrosis of nasal disc; macula and fovea stable OS: Retina attached under oil; diffuse atrophy; mild interval improvement in IRF / edema nasal macula   Clinical management:  See below  Abbreviations: NFP - Normal foveal profile. CME - cystoid macular edema. PED - pigment epithelial detachment. IRF - intraretinal fluid. SRF - subretinal fluid. EZ - ellipsoid zone. ERM - epiretinal membrane. ORA - outer retinal atrophy. ORT - outer retinal tubulation. SRHM - subretinal hyper-reflective material. IRHM - intraretinal hyper-reflective material            ASSESSMENT/PLAN:    ICD-10-CM   1.  Vitreous hemorrhage of right eye (HCC)  H43.11     2. Proliferative diabetic retinopathy of both eyes with macular edema associated with type 2 diabetes mellitus ([place])  E11.3513 OCT, Retina - OU - Both Eyes    3. Vitreous hemorrhage of left eye (HCC)  H43.12     4. Traction detachment of left retina  H33.42     5. Retinal tear of right eye  H33.311     6. Essential hypertension  I10     7. Hypertensive retinopathy of both eyes  H35.033     8. Combined forms of age-related cataract of  right eye  H25.811     9. Pseudophakia  Z96.1      **examination and OCT show mild interval improvement in IRF, but pt too weak to undergo intravitreal injections - pt with nausea, lightheadedness and somnolence in exam room - BG found to be 42 -- given some glucose gel, crackers and juice and BG began to increase - f/u in 1 wk for intravitreal injections  VH OD - New floaters noticed on 12.23.22 exam - likely related to history of PDR and HTN  - s/p IVA OD #1 (12.23.22), #2 (01.27.23), #3 (02.24.23), #4 (03.24.23), #5 (04.21.23)  - today, interval improvement in VH and BCVA 20/25 OD - stable - recommend IVA OD #6 today, 05.19.23 for PDR, persistent VH  - will hold off on injection today due to pt not feeling well - IVA informed consent obtained and signed, 12.23.22 (OD) - see procedure note - f/u 1 weeks w/DFE/OCT/likely injection  2-4. Proliferative diabetic retinopathy OU  - s/p PRP OD (09.19.22)  - s/p IVA OS #1 (01.27.23), #2 (02.24.23), #3 (03.24.23), #4 (04.21.23)             - OD: w/ +NVD  - OS: with chronic, diffuse VH and underlying TRD - exam shows +NVD and scattered fibrosis OD,              - s/p PPV/MP/EL/FAX/silicone oil (9390 cs) OS 10.20.22             - intra-op -- retina beneath chronic, dense VH was severely ischemic, florid fibrosis w/ +360 posterior TRD w/ stretch holes             - good oil fill, retina attached under oil; fibrosis improved, and good laser changes in place  - BCVA remains CF OS             - IOP good at 10,14  - OCT shows OD: mild Interval improvement of vitreous opacities / VH, Persistent fibrosis of nasal disc, macular and fovea stable; ZE:SPQZRA attached under oil; diffuse atrophy; mild interval improvement in IRF / edema nasal macula              - cont PF QID OS    Ketorolac QID OS             - drop instructions reviewed             - Recommend IVA OS #5 today, 05.19.23 for persistent DME            - will hold off on injection today  due to pt not feeling well - informed consent obtained and signed, 01.27.23 (OS)             - F/u 1 wks w/DFE/OCT/poss. injection  5. Retinal tear, OD - small flap tear located at 0130 equator, no SRF - lasered with  PRP OD as above on 09.19.22  6,7. Hypertensive retinopathy OU - discussed importance of tight BP control - monitor    8. Mixed Cataract OD - The symptoms of cataract, surgical options, and treatments and risks were discussed with patient.  - discussed diagnosis and progression - under the expert management of Dr. Wyatt Portela  9. Pseudophakia OS  - s/p CE/IOL (Dr. Katy Fitch)  - IOL in good position, doing well  - monitor   Ophthalmic Meds Ordered this visit:  No orders of the defined types were placed in this encounter.    Return in about 5 days (around 05/12/2022) for f/u PDR OU, DFE, FA.  There are no Patient Instructions on file for this visit.    This document serves as a record of services personally performed by Gardiner Sleeper, MD, PhD. It was created on their behalf by Leonie Douglas, an ophthalmic technician. The creation of this record is the provider's dictation and/or activities during the visit.    Electronically signed by: Leonie Douglas COA, 05/10/22  10:27 PM  This document serves as a record of services personally performed by Gardiner Sleeper, MD, PhD. It was created on their behalf by San Jetty. Owens Shark, OA an ophthalmic technician. The creation of this record is the provider's dictation and/or activities during the visit.    Electronically signed by: San Jetty. Owens Shark, OA '@TODAY'$ @ 10:27 PM  Gardiner Sleeper, M.D., Ph.D. Diseases & Surgery of the Retina and Vitreous [facility]  I have reviewed the above documentation for accuracy and completeness, and I agree with the above. Gardiner Sleeper, M.D., Ph.D. 05/10/22 10:31 PM   Abbreviations: M myopia (nearsighted); A astigmatism; H hyperopia (farsighted); P presbyopia; Mrx spectacle  prescription;  CTL contact lenses; OD right eye; OS left eye; OU both eyes  XT exotropia; ET esotropia; PEK punctate epithelial keratitis; PEE punctate epithelial erosions; DES dry eye syndrome; MGD meibomian gland dysfunction; ATs artificial tears; PFAT's preservative free artificial tears; [place] nuclear sclerotic cataract; PSC posterior subcapsular cataract; ERM epi-retinal membrane; PVD posterior vitreous detachment; RD retinal detachment; DM diabetes mellitus; DR diabetic retinopathy; NPDR non-proliferative diabetic retinopathy; PDR proliferative diabetic retinopathy; CSME clinically significant macular edema; DME diabetic macular edema; dbh dot blot hemorrhages; CWS cotton wool spot; POAG primary open angle glaucoma; C/D cup-to-disc ratio; HVF humphrey visual field; GVF goldmann visual field; OCT optical coherence tomography; IOP intraocular pressure; BRVO Branch retinal vein occlusion; CRVO central retinal vein occlusion; CRAO central retinal artery occlusion; BRAO branch retinal artery occlusion; RT retinal tear; SB scleral buckle; PPV pars plana vitrectomy; VH Vitreous hemorrhage; PRP panretinal laser photocoagulation; IVK intravitreal kenalog; VMT vitreomacular traction; MH Macular hole;  NVD neovascularization of the disc; NVE neovascularization elsewhere; AREDS age related eye disease study; ARMD age related macular degeneration; POAG primary open angle glaucoma; EBMD epithelial/anterior basement membrane dystrophy; ACIOL anterior chamber intraocular lens; IOL intraocular lens; PCIOL posterior chamber intraocular lens; Phaco/IOL phacoemulsification with intraocular lens placement; [place] photorefractive keratectomy; LASIK laser assisted in situ keratomileusis; HTN hypertension; DM diabetes mellitus; COPD chronic obstructive pulmonary disease

## 2022-05-04 ENCOUNTER — Other Ambulatory Visit (HOSPITAL_COMMUNITY): Payer: Self-pay

## 2022-05-04 ENCOUNTER — Other Ambulatory Visit: Payer: Self-pay

## 2022-05-04 ENCOUNTER — Telehealth (HOSPITAL_COMMUNITY): Payer: Self-pay | Admitting: Pharmacist

## 2022-05-04 MED ORDER — RIVAROXABAN 20 MG PO TABS
20.0000 mg | ORAL_TABLET | Freq: Every day | ORAL | 11 refills | Status: DC
  Filled 2022-05-04: qty 30, 30d supply, fill #0
  Filled 2022-05-31: qty 30, 30d supply, fill #1
  Filled 2022-07-05: qty 30, 30d supply, fill #2
  Filled 2022-08-11: qty 30, 30d supply, fill #3
  Filled 2022-09-10: qty 30, 30d supply, fill #4
  Filled 2022-10-27: qty 30, 30d supply, fill #5
  Filled 2022-12-12: qty 30, 30d supply, fill #6
  Filled 2023-02-26: qty 30, 30d supply, fill #7

## 2022-05-04 NOTE — Telephone Encounter (Signed)
Patient is currently taking Pradaxa and receives through [facility] patient assistance.  The company has taken Pradaxa off of their covered list. We cannot renew this assistance.  ? ?Spoke with Dr. [name]. Patient will need to be changed from Pradaxa to either Xarelto or warfarin (previously failed Eliquis, CVA). Counseled patient on risks/benefits of both Xarelto and warfarin. Patient wishes to try Xarelto. Will send in Xarelto prescription to [facility] under [facility] Fund. Patient will take last dose of Pradaxa in the morning, then start Xarelto 20 mg daily (CrCl 59 mL/min using actual body weight) in the evening. Patient expressed understanding. ? ?Audry Riles, PharmD, BCPS, BCCP, CPP ?[facility] Failure Clinic Pharmacist ?[phone] ? ?  ?

## 2022-05-05 ENCOUNTER — Other Ambulatory Visit (HOSPITAL_COMMUNITY): Payer: Self-pay

## 2022-05-06 ENCOUNTER — Other Ambulatory Visit (HOSPITAL_COMMUNITY): Payer: Self-pay

## 2022-05-07 ENCOUNTER — Encounter (INDEPENDENT_AMBULATORY_CARE_PROVIDER_SITE_OTHER): Payer: Self-pay | Admitting: Ophthalmology

## 2022-05-07 ENCOUNTER — Other Ambulatory Visit (HOSPITAL_COMMUNITY): Payer: Self-pay

## 2022-05-07 ENCOUNTER — Ambulatory Visit (INDEPENDENT_AMBULATORY_CARE_PROVIDER_SITE_OTHER): Admitting: Ophthalmology

## 2022-05-07 VITALS — BP 106/66

## 2022-05-07 DIAGNOSIS — H4311 Vitreous hemorrhage, right eye: Secondary | ICD-10-CM

## 2022-05-07 DIAGNOSIS — E113513 Type 2 diabetes mellitus with proliferative diabetic retinopathy with macular edema, bilateral: Secondary | ICD-10-CM

## 2022-05-07 DIAGNOSIS — H33311 Horseshoe tear of retina without detachment, right eye: Secondary | ICD-10-CM | POA: Diagnosis not present

## 2022-05-07 DIAGNOSIS — I10 Essential (primary) hypertension: Secondary | ICD-10-CM | POA: Diagnosis not present

## 2022-05-07 DIAGNOSIS — H35033 Hypertensive retinopathy, bilateral: Secondary | ICD-10-CM

## 2022-05-07 DIAGNOSIS — H25811 Combined forms of age-related cataract, right eye: Secondary | ICD-10-CM

## 2022-05-07 DIAGNOSIS — H3342 Traction detachment of retina, left eye: Secondary | ICD-10-CM

## 2022-05-07 DIAGNOSIS — H4313 Vitreous hemorrhage, bilateral: Secondary | ICD-10-CM | POA: Diagnosis not present

## 2022-05-07 DIAGNOSIS — Z961 Presence of intraocular lens: Secondary | ICD-10-CM

## 2022-05-07 DIAGNOSIS — H4312 Vitreous hemorrhage, left eye: Secondary | ICD-10-CM

## 2022-05-10 ENCOUNTER — Other Ambulatory Visit (HOSPITAL_COMMUNITY): Payer: Self-pay

## 2022-05-10 ENCOUNTER — Encounter (INDEPENDENT_AMBULATORY_CARE_PROVIDER_SITE_OTHER): Payer: Self-pay | Admitting: Ophthalmology

## 2022-05-11 NOTE — Progress Notes (Signed)
[facility] Note  05/12/2022     CHIEF COMPLAINT Patient presents for Retina Follow Up  HISTORY OF PRESENT ILLNESS: Adrienne Lane is a 56 y.o. female who presents to the clinic today for:  HPI     Retina Follow Up   Patient presents with  Other.  In right eye.  This started months ago.  Severity is moderate.  Duration of 5 days.  Since onset it is stable.  I, the attending physician,  performed the HPI with the patient and updated documentation appropriately.        Comments   Patient feels that the vision is slightly better since her last visit 5 days ago. The vision is clearer.  She has been sleeping elevated as instructed. Her blood sugar was 133 this morning.       Last edited by Bernarda Caffey, MD on 05/12/2022 12:20 PM.     Pt here for IVA OU  Referring physician: [facility] [place],  [place] [address]  HISTORICAL INFORMATION:   Selected notes from the MEDICAL RECORD NUMBER Referred by Dr. Wyatt Portela for concern of VH   CURRENT MEDICATIONS: Current Outpatient Medications (Ophthalmic Drugs)  Medication Sig   Bromfenac Sodium (PROLENSA) 0.07 % SOLN Place 1 drop into the left eye 4 (four) times daily.   ketorolac (ACULAR) 0.5 % ophthalmic solution Place 1 drop into the left eye 4 (four) times daily.   prednisoLONE acetate (PRED FORTE) 1 % ophthalmic suspension Place 1 drop into the left eye 4 (four) times daily.   No current facility-administered medications for this visit. (Ophthalmic Drugs)   Current Outpatient Medications (Other)  Medication Sig   acetaminophen (TYLENOL) 500 MG tablet Take 500 mg by mouth every 6 (six) hours as needed for moderate pain or headache.   amLODipine (NORVASC) 10 MG tablet Take 1 tablet (10 mg total) by mouth daily.   aspirin EC 81 MG tablet Take 81 mg by mouth daily. Swallow whole.   atorvastatin (LIPITOR) 80 MG tablet Take 1 tablet (80 mg total) by mouth  daily. (Patient taking differently: Take 80 mg by mouth at bedtime.)   carvedilol (COREG) 6.25 MG tablet Take 1 tablet (6.25 mg total) by mouth 2 (two) times daily with a meal.   cholecalciferol (VITAMIN D3) 25 MCG (1000 UNIT) tablet Take 1,000 Units by mouth daily.   dapagliflozin propanediol (FARXIGA) 10 MG TABS tablet Take 1 tablet (10 mg total) by mouth daily before breakfast.   glucose blood (TRUE METRIX BLOOD GLUCOSE TEST) test strip Use to check fasting blood glucose 3 times daily   hydrALAZINE (APRESOLINE) 100 MG tablet Take 1 tablet (100 mg total) by mouth 3 (three) times daily.   insulin aspart (NOVOLOG) 100 UNIT/ML injection Use as directed using sliding scale three times daily.   insulin aspart (NOVOLOG) 100 UNIT/ML injection inject by subcutaneous route per prescriber's instructions. Insulin dosing requires individualization. for 30 days   insulin glargine (LANTUS) 100 UNIT/ML injection inject 22 by subcutaneous route as per insulin protocol for 30 days (Patient taking differently: Inject 22 Units into the skin daily.)   insulin glargine (LANTUS) 100 UNIT/ML injection inject 22 units subcutaneously for 30 days   insulin glargine (LANTUS) 100 UNIT/ML injection inject 22 by subcutaneous route as per insulin protocol for 30 days   insulin glargine (LANTUS) 100 UNIT/ML injection inject 25 by subcutaneous route as per insulin protocol for 30 days  Insulin Syringe-Needle U-100 (TRUEPLUS INSULIN SYRINGE) 30G X 5/16" 0.3 ML MISC USE AS DIRECTED 3 TIMES DAILY.   Insulin Syringe-Needle U-100 30G X 5/16" 0.5 ML MISC Use as directed.   isosorbide mononitrate (IMDUR) 60 MG 24 hr tablet Take 1 tablet (60 mg total) by mouth daily.   Lancets (ONETOUCH ULTRASOFT) lancets Check CBG twice a day   metroNIDAZOLE (FLAGYL) 500 MG tablet Take 1 tablet (500 mg) by mouth every 12 hours for 7 days   metroNIDAZOLE (FLAGYL) 500 MG tablet take 1 tablet (500 mg) by oral route every 12 hours for 7 days   mexiletine  (MEXITIL) 150 MG capsule TAKE 2 CAPSULES (300 MG TOTAL) BY MOUTH EVERY 12 (TWELVE) HOURS. (Patient taking differently: Take 300 mg by mouth 2 (two) times daily.)   Multiple Vitamins-Minerals (WOMENS MULTI PO) Take 1 tablet by mouth daily.   Multiple Vitamins-Minerals (ZINC PO) Take 1 tablet by mouth daily.   NIFEdipine (ADALAT CC) 60 MG 24 hr tablet take 1 tablet (60 mg) by oral route once daily for 30 days   oxyCODONE-acetaminophen (PERCOCET/ROXICET) 5-325 MG tablet Take 1-2 tablets by mouth every 6 (six) hours as needed for severe pain.   potassium chloride (KLOR-CON) 10 MEQ tablet Take 1 tablet (10 mEq total) by mouth daily.   potassium chloride SA (KLOR-CON M) 20 MEQ tablet Take 1 tablet (20 mEq total) by mouth daily.   rivaroxaban (XARELTO) 20 MG TABS tablet Take 1 tablet (20 mg total) by mouth daily with supper.   sacubitril-valsartan (ENTRESTO) 24-26 MG Take 1 tablet by mouth 2 (two) times daily.   TRUEplus Lancets 28G MISC Use to check fasting blood glucose 3 times daily   No current facility-administered medications for this visit. (Other)   REVIEW OF SYSTEMS: ROS   Positive for: Genitourinary, Endocrine, Cardiovascular, Eyes, Respiratory Negative for: Constitutional, Gastrointestinal, Neurological, Skin, Musculoskeletal, HENT, Psychiatric, Allergic/Imm, Heme/Lymph Last edited by Annie Paras, COT on 05/12/2022  9:31 AM.     ALLERGIES Allergies  Allergen Reactions   Adhesive [Tape]     Tears skin, can tolerate paper tape   PAST MEDICAL HISTORY Past Medical History:  Diagnosis Date   Atrial fibrillation with RVR ([place])    Bigeminy 01/2020   Cataract    CHF (congestive heart failure) ([place]) 10/20/2019   CKD (chronic kidney disease), stage IV (HCC)    Diabetes mellitus without complication ([place])    Diabetic retinopathy ([place])    DOE (dyspnea on exertion)    walking upstairs or up hill resolves in one minute   Dysrhythmia    a-fib - on pradaxa   Fibroids    History  of kidney stones    History of recent blood transfusion 02/26/2020   Hypertension    Hypertensive retinopathy    Iron deficiency anemia    Non-ischemic cardiomyopathy ([place])    tachycardia induced   Obese    Peripheral edema    Premature ventricular contractions (PVCs) ([facility])    Sleep apnea    does not use cpap   Stroke ([place]) 28/3151   Umbilical hernia    Wears glasses    Past Surgical History:  Procedure Laterality Date   CATARACT EXTRACTION     CHOLECYSTECTOMY     CYSTOSCOPY W/ URETERAL STENT PLACEMENT Bilateral 05/24/2020   Procedure: CYSTOSCOPY WITH RETROGRADE PYELOGRAM/URETERAL STENT PLACEMENT;  Surgeon: Robley Fries, MD;  Location: [facility];  Service: Urology;  Laterality: Bilateral;   CYSTOSCOPY/RETROGRADE/URETEROSCOPY Bilateral 04/03/2020   Procedure: CYSTOSCOPY/RETROGRADE/URETEROSCOPY;  Surgeon:  Ardis Hughs, MD;  Location: [facility];  Service: Urology;  Laterality: Bilateral;   EYE SURGERY Left 08/2021   cataract removed   HYSTERECTOMY ABDOMINAL WITH SALPINGO-OOPHORECTOMY  07/21/2020   Procedure: HYSTERECTOMY ABDOMINAL WITH BILATERAL SALPINGO-OOPHORECTOMY;  Surgeon: Sanjuana Kava, MD;  Location: [facility];  Service: Gynecology;;   INJECTION OF SILICONE OIL Left 29/79/8921   Procedure: INJECTION OF SILICONE OIL;  Surgeon: Bernarda Caffey, MD;  Location: [facility];  Service: Ophthalmology;  Laterality: Left;   IR FLUORO GUIDE CV LINE RIGHT  02/21/2020   IR US GUIDE VASC ACCESS RIGHT  02/21/2020   MEMBRANE PEEL Left 10/08/2021   Procedure: MEMBRANE PEEL;  Surgeon: Bernarda Caffey, MD;  Location: [facility];  Service: Ophthalmology;  Laterality: Left;   NEPHROLITHOTOMY Left 02/14/2020   Procedure: NEPHROLITHOTOMY PERCUTANEOUS/ SURGEON ACCESS/ LEFT PERCUTANEOUS NEPHROSTOMY TUBE PLACEMENT;  Surgeon: Ardis Hughs, MD;  Location: [facility];  Service: Urology;  Laterality: Left;   NEPHROLITHOTOMY Left 02/21/2020   Procedure: NEPHROLITHOTOMY PERCUTANEOUS SECOND LOOK;  Surgeon: Ardis Hughs, MD;  Location: [facility];  Service: Urology;  Laterality: Left;   NEPHROLITHOTOMY Left 02/26/2020   Procedure: NEPHROLITHOTOMY PERCUTANEOUS;  Surgeon: Ceasar Mons, MD;  Location: [facility];  Service: Urology;  Laterality: Left;  NEED 150 MIN   NEPHROLITHOTOMY Right 03/24/2020   Procedure: NEPHROLITHOTOMY PERCUTANEOUS WITH ACCESS LEFT STENT REMOVAL;  Surgeon: Ardis Hughs, MD;  Location: [facility];  Service: Urology;  Laterality: Right;   PARS PLANA VITRECTOMY Left 10/08/2021   Procedure: PARS PLANA VITRECTOMY WITH 25 GAUGE;  Surgeon: Bernarda Caffey, MD;  Location: [facility];  Service: Ophthalmology;  Laterality: Left;   PHOTOCOAGULATION WITH LASER Left 10/08/2021   Procedure: PHOTOCOAGULATION WITH LASER;  Surgeon: Bernarda Caffey, MD;  Location: [facility];  Service: Ophthalmology;  Laterality: Left;   RIGHT HEART CATH N/A 11/09/2019   Procedure: RIGHT HEART CATH;  Surgeon: Jolaine Artist, MD;  Location: [facility];  Service: Cardiovascular;  Laterality: N/A;   WISDOM TOOTH EXTRACTION     FAMILY HISTORY Family History  Problem Relation Age of Onset   Atrial fibrillation Mother    Hypertension Mother    Stroke Mother 85   Diabetes Mother    Diabetes Father    Hypertension Father    Diabetes Sister    Hypertension Sister    Diabetes Brother    Hypertension Brother    Diabetes Brother    Heart attack Brother    Stroke Maternal Grandmother 7   SOCIAL HISTORY Social History   Tobacco Use   Smoking status: Never   Smokeless tobacco: Never  Vaping Use   Vaping Use: Never used  Substance Use Topics   Alcohol use: Never   Drug use: Never       OPHTHALMIC EXAM:  Base Eye Exam     Visual Acuity (Snellen - Linear)       Right Left   Dist cc 20/25 CF at 3'    Correction: Glasses         Tonometry (Tonopen, 9:35 AM)       Right Left   Pressure 11 13         Pupils       Dark Light Shape React APD   Right 3 2 Round Brisk None   Left 3 2  Round Brisk +1         Visual Fields       Left Right     Full  Restrictions Partial outer superior temporal, inferior temporal, superior nasal, inferior nasal deficiencies          Extraocular Movement       Right Left    Full, Ortho Full, Ortho         Neuro/Psych     Oriented x3: Yes   Mood/Affect: Normal         Dilation     Both eyes: 1.0% Mydriacyl, 2.5% Phenylephrine @ 9:33 AM           Slit Lamp and Fundus Exam     External Exam       Right Left   External Normal Normal         Slit Lamp Exam       Right Left   Lids/Lashes Dermatochalasis - upper lid Dermatochalasis - upper lid   Conjunctiva/Sclera Melanosis sutures dissolved, mild melanosis   Cornea mild arcus, trace PEE mild arcus, well healed cataract wound, 1+ Punctate epithelial erosions   Anterior Chamber Deep and clear Deep and quiet, 1-2+ cell and pigment   Iris Round and dilated, No NVI Round and dilated, No NVI   Lens 1-2+ Nuclear sclerosis, 1-2+ Cortical cataract, fine pigment deposition on posterior capsule PC IOL in good position, pigment deposition on optic, 1-2+PCO   Anterior Vitreous Vitreous syneresis, +RBC, blood stained vitreous condensations - slightly increased post vitrectomy, good oil fill, +pigment         Fundus Exam       Right Left   Disc hazy view, perfused 1+pallor, sharp nasal rim, +temporal edema - improved   C/D Ratio 0.5 0.2   Macula hazy view, grossly flat Flat under oil, fibrosis improved, scattered IRH/DBH, residual fibrosis and thickening nasal macula -- slightly improved   Vessels attenuated, Tortuous, peripheral sclerosis and fibrosis Severe attenuation / sclerosis, +fibrosis improved, tortuosity   Periphery Attached, scattered patches of fibrosis and NVE, small flap tear at 0130 equator--no SRF, good laser surrounding, old VH settled inferiorly - improving; PRP laser changes Attached, Scattered DBH; good PRP changes 360; scattered fibrosis --  improved           Refraction     Wearing Rx       Sphere Cylinder Axis   Right -2.50 +1.75 178   Left -2.50 +1.25 018           IMAGING AND PROCEDURES  Imaging and Procedures for 05/12/2022  Intravitreal Injection, Pharmacologic Agent - OD - Right Eye       Time Out 05/12/2022. 9:45 AM. Confirmed correct patient, procedure, site, and patient consented.   Anesthesia Topical anesthesia was used. Anesthetic medications included Lidocaine 2%, Lidocaine 4%, Proparacaine 0.5%.   Procedure Preparation included 5% betadine to ocular surface, eyelid speculum. A supplied (33g) needle was used.   Injection: 1.25 mg Bevacizumab 1.'25mg'$ /0.65m   Route: Intravitreal, Site: Right Eye   NDC: 5H061816 Lot: [id number]'@3'$ , Expiration date: 06/30/2022   Post-op Post injection exam found visual acuity of at least counting fingers. The patient tolerated the procedure well. There were no complications. The patient received written and verbal post procedure care education. Post injection medications were not given.      Intravitreal Injection, Pharmacologic Agent - OS - Left Eye       Time Out 05/12/2022. 9:49 AM. Confirmed correct patient, procedure, site, and patient consented.   Anesthesia Topical anesthesia was used. Anesthetic medications included Lidocaine 2%, Proparacaine 0.5%.   Procedure Preparation included 5% betadine to ocular surface, eyelid  speculum. A (32g) needle was used.   Injection: 1.25 mg Bevacizumab 1.'25mg'$ /0.31m   Route: Intravitreal, Site: Left Eye   NDC: 5H061816 Lot:: [id number] Expiration date: 07/04/2022   Post-op Post injection exam found visual acuity of at least counting fingers, no retinal detachment. The patient tolerated the procedure well. There were no complications. The patient received written and verbal post procedure care education. Post injection medications were not given.            ASSESSMENT/PLAN:    ICD-10-CM   1.  Proliferative diabetic retinopathy of both eyes with macular edema associated with type 2 diabetes mellitus (HCC)  EP53.7482Intravitreal Injection, Pharmacologic Agent - OD - Right Eye    Intravitreal Injection, Pharmacologic Agent - OS - Left Eye    Bevacizumab (AVASTIN) SOLN 1.25 mg    Bevacizumab (AVASTIN) SOLN 1.25 mg    CANCELED: Intravitreal Injection, Pharmacologic Agent - OD - Right Eye    2. Vitreous hemorrhage of right eye ([place]  H43.11     3. Vitreous hemorrhage of left eye (HCC)  H43.12     4. Traction detachment of left retina  H33.42     5. Retinal tear of right eye  H33.311     6. Essential hypertension  I10     7. Hypertensive retinopathy of both eyes  H35.033     8. Combined forms of age-related cataract of right eye  H25.811     9. Pseudophakia  Z96.1      1-4. Proliferative diabetic retinopathy OU  - s/p PRP OD (09.19.22)  - s/p IVA OD #1 (12.23.22), #2 (01.27.23), #3 (02.24.23), #4 (03.24.23), #5 (04.21.23)  - s/p IVA OS #1 (01.27.23), #2 (02.24.23), #3 (03.24.23), #4 (04.21.23)             - OD: w/ +NVD  - OS: with chronic, diffuse VH and underlying TRD - exam shows +NVD and scattered fibrosis OD,              - s/p PPV/MP/EL/FAX/silicone oil ([id number]) OS 10.20.22             - intra-op -- retina beneath chronic, dense VH was severely ischemic, florid fibrosis w/ +360 posterior TRD w/ stretch holes             - good oil fill, retina attached under oil; fibrosis improved, and good laser changes in place  - BCVA remains CF OS             - IOP good at 11,13  - OCT images not obtained today             - cont PF QID OS    Ketorolac QID OS             - drop instructions reviewed             - Recommend IVA OD #6 and OS #5 today, 05.24.23 for persistent DME            - pt wishes to proceed with injection  - RBA of procedure discussed, questions answered - informed consent obtained and signed - see procedure note - IVA informed consent obtained and  signed, 12.23.22 (OD) - informed consent obtained and signed, 01.27.23 (OS)             - F/u 4 wks w/DFE/OCT/poss. Injection  2.VH OD - New floaters noticed on 12.23.22 exam - likely related to history of PDR and HTN  - s/p  IVA OD #1 (12.23.22), #2 (01.27.23), #3 (02.24.23), #4 (03.24.23), #5 (04.21.23)  - today, stable improvement in VH and BCVA 20/25 OD - recommend IVA OD #6 today, 05.24.23 for PDR as above - IVA informed consent obtained and signed, 12.23.22 (OD) - see procedure note - f/u 4 weeks w/DFE/OCT/likely injection  5. Retinal tear, OD - small flap tear located at 0130 equator, no SRF - lasered with PRP OD as above on 09.19.22  6,7. Hypertensive retinopathy OU - discussed importance of tight BP control - monitor    8. Mixed Cataract OD - The symptoms of cataract, surgical options, and treatments and risks were discussed with patient.  - discussed diagnosis and progression - under the expert management of Dr. Wyatt Portela  9. Pseudophakia OS  - s/p CE/IOL (Dr. Katy Fitch)  - IOL in good position, doing well  - monitor   Ophthalmic Meds Ordered this visit:  Meds ordered this encounter  Medications   Bevacizumab (AVASTIN) SOLN 1.25 mg   Bevacizumab (AVASTIN) SOLN 1.25 mg     Return in about 4 weeks (around 06/09/2022) for PDR OU, Dilated Exam, OCT, Possible Injxn.  There are no Patient Instructions on file for this visit.  This document serves as a record of services personally performed by Gardiner Sleeper, MD, PhD. It was created on their behalf by Leonie Douglas, an ophthalmic technician. The creation of this record is the provider's dictation and/or activities during the visit.    Electronically signed by: Leonie Douglas COA, 05/12/22  12:55 PM  This document serves as a record of services personally performed by Gardiner Sleeper, MD, PhD. It was created on their behalf by San Jetty. Owens Shark, OA an ophthalmic technician. The creation of this record is the provider's  dictation and/or activities during the visit.    Electronically signed by: San Jetty. Owens Shark, [place] 05.24.2023 12:55 PM  Gardiner Sleeper, M.D., Ph.D. Diseases & Surgery of the Retina and Vitreous [facility]  I have reviewed the above documentation for accuracy and completeness, and I agree with the above. Gardiner Sleeper, M.D., Ph.D. 05/12/22 12:56 PM   Abbreviations: M myopia (nearsighted); A astigmatism; H hyperopia (farsighted); P presbyopia; Mrx spectacle prescription;  CTL contact lenses; OD right eye; OS left eye; OU both eyes  XT exotropia; ET esotropia; PEK punctate epithelial keratitis; PEE punctate epithelial erosions; DES dry eye syndrome; MGD meibomian gland dysfunction; ATs artificial tears; PFAT's preservative free artificial tears; [place] nuclear sclerotic cataract; PSC posterior subcapsular cataract; ERM epi-retinal membrane; PVD posterior vitreous detachment; RD retinal detachment; DM diabetes mellitus; DR diabetic retinopathy; NPDR non-proliferative diabetic retinopathy; PDR proliferative diabetic retinopathy; CSME clinically significant macular edema; DME diabetic macular edema; dbh dot blot hemorrhages; CWS cotton wool spot; POAG primary open angle glaucoma; C/D cup-to-disc ratio; HVF humphrey visual field; GVF goldmann visual field; OCT optical coherence tomography; IOP intraocular pressure; BRVO Branch retinal vein occlusion; CRVO central retinal vein occlusion; CRAO central retinal artery occlusion; BRAO branch retinal artery occlusion; RT retinal tear; SB scleral buckle; PPV pars plana vitrectomy; VH Vitreous hemorrhage; PRP panretinal laser photocoagulation; IVK intravitreal kenalog; VMT vitreomacular traction; MH Macular hole;  NVD neovascularization of the disc; NVE neovascularization elsewhere; AREDS age related eye disease study; ARMD age related macular degeneration; POAG primary open angle glaucoma; EBMD epithelial/anterior basement membrane dystrophy; ACIOL  anterior chamber intraocular lens; IOL intraocular lens; PCIOL posterior chamber intraocular lens; Phaco/IOL phacoemulsification with intraocular lens placement; PRK photorefractive keratectomy; LASIK laser assisted in  situ keratomileusis; HTN hypertension; DM diabetes mellitus; COPD chronic obstructive pulmonary disease

## 2022-05-12 ENCOUNTER — Ambulatory Visit (INDEPENDENT_AMBULATORY_CARE_PROVIDER_SITE_OTHER): Admitting: Ophthalmology

## 2022-05-12 ENCOUNTER — Encounter (INDEPENDENT_AMBULATORY_CARE_PROVIDER_SITE_OTHER): Payer: Self-pay | Admitting: Ophthalmology

## 2022-05-12 DIAGNOSIS — H4311 Vitreous hemorrhage, right eye: Secondary | ICD-10-CM

## 2022-05-12 DIAGNOSIS — E113513 Type 2 diabetes mellitus with proliferative diabetic retinopathy with macular edema, bilateral: Secondary | ICD-10-CM

## 2022-05-12 DIAGNOSIS — H3342 Traction detachment of retina, left eye: Secondary | ICD-10-CM

## 2022-05-12 DIAGNOSIS — H33311 Horseshoe tear of retina without detachment, right eye: Secondary | ICD-10-CM

## 2022-05-12 DIAGNOSIS — H35033 Hypertensive retinopathy, bilateral: Secondary | ICD-10-CM

## 2022-05-12 DIAGNOSIS — H4312 Vitreous hemorrhage, left eye: Secondary | ICD-10-CM

## 2022-05-12 DIAGNOSIS — I10 Essential (primary) hypertension: Secondary | ICD-10-CM

## 2022-05-12 DIAGNOSIS — H25811 Combined forms of age-related cataract, right eye: Secondary | ICD-10-CM

## 2022-05-12 DIAGNOSIS — Z961 Presence of intraocular lens: Secondary | ICD-10-CM

## 2022-05-12 MED ORDER — BEVACIZUMAB CHEMO INJECTION 1.25MG/0.05ML SYRINGE FOR KALEIDOSCOPE
1.2500 mg | INTRAVITREAL | Status: AC | PRN
  Administered 2022-05-12: 1.25 mg via INTRAVITREAL

## 2022-05-14 ENCOUNTER — Other Ambulatory Visit (HOSPITAL_BASED_OUTPATIENT_CLINIC_OR_DEPARTMENT_OTHER): Payer: Self-pay

## 2022-05-14 MED ORDER — ISOSORBIDE MONONITRATE ER 60 MG PO TB24
ORAL_TABLET | ORAL | 3 refills | Status: DC
  Filled 2022-05-18: qty 90, 90d supply, fill #0

## 2022-05-14 MED ORDER — ATORVASTATIN CALCIUM 80 MG PO TABS
ORAL_TABLET | ORAL | 3 refills | Status: DC
  Filled 2022-05-14 – 2022-05-18 (×2): qty 90, 90d supply, fill #0
  Filled 2022-05-18: qty 30, 30d supply, fill #0

## 2022-05-14 MED ORDER — NIFEDIPINE ER OSMOTIC RELEASE 60 MG PO TB24
60.0000 mg | ORAL_TABLET | Freq: Every day | ORAL | 3 refills | Status: DC
  Filled 2022-05-18: qty 30, 30d supply, fill #0
  Filled 2022-05-18: qty 90, 90d supply, fill #0
  Filled 2022-07-05: qty 30, 30d supply, fill #1
  Filled 2022-08-11: qty 30, 30d supply, fill #2
  Filled 2022-09-14: qty 30, 30d supply, fill #3
  Filled 2022-10-27: qty 30, 30d supply, fill #4
  Filled 2022-12-12: qty 30, 30d supply, fill #5
  Filled 2023-01-31: qty 30, 30d supply, fill #6

## 2022-05-18 ENCOUNTER — Other Ambulatory Visit: Payer: Self-pay

## 2022-05-18 ENCOUNTER — Other Ambulatory Visit (HOSPITAL_COMMUNITY): Payer: Self-pay

## 2022-05-19 ENCOUNTER — Other Ambulatory Visit (HOSPITAL_COMMUNITY): Payer: Self-pay

## 2022-05-21 ENCOUNTER — Other Ambulatory Visit (HOSPITAL_COMMUNITY): Payer: Self-pay

## 2022-05-21 ENCOUNTER — Other Ambulatory Visit (HOSPITAL_COMMUNITY): Payer: Self-pay | Admitting: *Deleted

## 2022-05-21 MED ORDER — ISOSORBIDE MONONITRATE ER 60 MG PO TB24
60.0000 mg | ORAL_TABLET | Freq: Every day | ORAL | 3 refills | Status: AC
  Filled 2022-05-21: qty 30, 30d supply, fill #0
  Filled 2022-07-05: qty 30, 30d supply, fill #1
  Filled 2022-08-11: qty 30, 30d supply, fill #2

## 2022-05-21 MED ORDER — ATORVASTATIN CALCIUM 80 MG PO TABS
80.0000 mg | ORAL_TABLET | Freq: Every day | ORAL | 3 refills | Status: DC
  Filled 2022-05-21: qty 30, 30d supply, fill #0
  Filled 2022-07-05: qty 30, 30d supply, fill #1
  Filled 2022-08-11: qty 30, 30d supply, fill #2
  Filled 2022-09-10: qty 30, 30d supply, fill #3

## 2022-05-25 DIAGNOSIS — R269 Unspecified abnormalities of gait and mobility: Secondary | ICD-10-CM | POA: Insufficient documentation

## 2022-05-31 ENCOUNTER — Other Ambulatory Visit: Payer: Self-pay

## 2022-05-31 ENCOUNTER — Other Ambulatory Visit (INDEPENDENT_AMBULATORY_CARE_PROVIDER_SITE_OTHER): Payer: Self-pay

## 2022-05-31 ENCOUNTER — Other Ambulatory Visit (HOSPITAL_COMMUNITY): Payer: Self-pay

## 2022-05-31 MED ORDER — PREDNISOLONE ACETATE 1 % OP SUSP
1.0000 [drp] | Freq: Four times a day (QID) | OPHTHALMIC | 3 refills | Status: DC
  Filled 2022-05-31: qty 5, 20d supply, fill #0

## 2022-05-31 MED ORDER — KETOROLAC TROMETHAMINE 0.5 % OP SOLN
1.0000 [drp] | Freq: Four times a day (QID) | OPHTHALMIC | 3 refills | Status: DC
  Filled 2022-05-31: qty 5, 20d supply, fill #0

## 2022-06-03 ENCOUNTER — Other Ambulatory Visit: Payer: Self-pay

## 2022-06-07 ENCOUNTER — Other Ambulatory Visit: Payer: Self-pay

## 2022-06-07 ENCOUNTER — Other Ambulatory Visit (HOSPITAL_COMMUNITY): Payer: Self-pay

## 2022-06-07 ENCOUNTER — Other Ambulatory Visit (HOSPITAL_COMMUNITY): Payer: Self-pay | Admitting: Internal Medicine

## 2022-06-07 MED ORDER — DAPAGLIFLOZIN PROPANEDIOL 10 MG PO TABS
10.0000 mg | ORAL_TABLET | Freq: Every day | ORAL | 6 refills | Status: DC
  Filled 2022-06-07: qty 30, 30d supply, fill #0
  Filled 2022-07-05: qty 30, 30d supply, fill #1
  Filled 2022-08-12: qty 30, 30d supply, fill #2
  Filled 2022-09-14: qty 30, 30d supply, fill #3

## 2022-06-09 ENCOUNTER — Encounter (INDEPENDENT_AMBULATORY_CARE_PROVIDER_SITE_OTHER): Admitting: Ophthalmology

## 2022-06-09 DIAGNOSIS — H4312 Vitreous hemorrhage, left eye: Secondary | ICD-10-CM

## 2022-06-09 DIAGNOSIS — E113513 Type 2 diabetes mellitus with proliferative diabetic retinopathy with macular edema, bilateral: Secondary | ICD-10-CM

## 2022-06-09 DIAGNOSIS — H33311 Horseshoe tear of retina without detachment, right eye: Secondary | ICD-10-CM

## 2022-06-09 DIAGNOSIS — H4311 Vitreous hemorrhage, right eye: Secondary | ICD-10-CM

## 2022-06-09 DIAGNOSIS — I10 Essential (primary) hypertension: Secondary | ICD-10-CM

## 2022-06-09 DIAGNOSIS — H3342 Traction detachment of retina, left eye: Secondary | ICD-10-CM

## 2022-06-09 DIAGNOSIS — E113593 Type 2 diabetes mellitus with proliferative diabetic retinopathy without macular edema, bilateral: Secondary | ICD-10-CM

## 2022-06-09 DIAGNOSIS — H35033 Hypertensive retinopathy, bilateral: Secondary | ICD-10-CM

## 2022-06-09 DIAGNOSIS — Z961 Presence of intraocular lens: Secondary | ICD-10-CM

## 2022-06-09 DIAGNOSIS — H25811 Combined forms of age-related cataract, right eye: Secondary | ICD-10-CM

## 2022-06-10 ENCOUNTER — Other Ambulatory Visit (HOSPITAL_BASED_OUTPATIENT_CLINIC_OR_DEPARTMENT_OTHER): Payer: Self-pay

## 2022-06-10 MED ORDER — INSULIN GLARGINE 100 UNIT/ML ~~LOC~~ SOLN: SUBCUTANEOUS | 1 refills | Status: DC

## 2022-06-15 ENCOUNTER — Other Ambulatory Visit (HOSPITAL_COMMUNITY): Payer: Self-pay

## 2022-06-15 ENCOUNTER — Telehealth (HOSPITAL_COMMUNITY): Payer: Self-pay | Admitting: *Deleted

## 2022-06-15 MED ORDER — SPIRONOLACTONE 25 MG PO TABS
25.0000 mg | ORAL_TABLET | Freq: Every day | ORAL | 0 refills | Status: DC
  Filled 2022-06-15: qty 30, 30d supply, fill #0

## 2022-06-15 NOTE — Telephone Encounter (Signed)
Per Dr Marisue Humble (nephrology), he would like pt to start Adrienne Lane 25 mg/day but pt request it be sent in under [facility], ok per Dr Gala Romney to send in under him, RX sent in with 30 day supply, advised to keep appt 7/14 for further refills.

## 2022-06-23 ENCOUNTER — Other Ambulatory Visit: Payer: Self-pay

## 2022-06-23 ENCOUNTER — Other Ambulatory Visit (HOSPITAL_COMMUNITY): Payer: Self-pay

## 2022-06-23 ENCOUNTER — Encounter (HOSPITAL_COMMUNITY): Payer: Self-pay

## 2022-06-23 ENCOUNTER — Emergency Department (HOSPITAL_COMMUNITY): Payer: Medicare Other

## 2022-06-23 ENCOUNTER — Emergency Department (HOSPITAL_COMMUNITY)
Admission: EM | Admit: 2022-06-23 | Discharge: 2022-06-23 | Disposition: A | Discharge status: Home / Self Care | Payer: Medicare Other | Attending: Emergency Medicine | Admitting: Emergency Medicine

## 2022-06-23 DIAGNOSIS — Z7984 Long term (current) use of oral hypoglycemic drugs: Secondary | ICD-10-CM | POA: Diagnosis not present

## 2022-06-23 DIAGNOSIS — E876 Hypokalemia: Secondary | ICD-10-CM | POA: Diagnosis not present

## 2022-06-23 DIAGNOSIS — I4891 Unspecified atrial fibrillation: Secondary | ICD-10-CM | POA: Diagnosis not present

## 2022-06-23 DIAGNOSIS — U071 COVID-19: Secondary | ICD-10-CM | POA: Insufficient documentation

## 2022-06-23 DIAGNOSIS — Z7901 Long term (current) use of anticoagulants: Secondary | ICD-10-CM | POA: Diagnosis not present

## 2022-06-23 DIAGNOSIS — Z7982 Long term (current) use of aspirin: Secondary | ICD-10-CM | POA: Diagnosis not present

## 2022-06-23 DIAGNOSIS — Z794 Long term (current) use of insulin: Secondary | ICD-10-CM | POA: Diagnosis not present

## 2022-06-23 DIAGNOSIS — E109 Type 1 diabetes mellitus without complications: Secondary | ICD-10-CM | POA: Insufficient documentation

## 2022-06-23 DIAGNOSIS — J029 Acute pharyngitis, unspecified: Secondary | ICD-10-CM | POA: Diagnosis present

## 2022-06-23 DIAGNOSIS — I10 Essential (primary) hypertension: Secondary | ICD-10-CM

## 2022-06-23 LAB — BASIC METABOLIC PANEL WITH GFR
Anion gap: 15 (ref 5–15)
BUN: 23 mg/dL — ABNORMAL HIGH (ref 6–20)
CO2: 22 mmol/L (ref 22–32)
Calcium: 9.1 mg/dL (ref 8.9–10.3)
Chloride: 107 mmol/L (ref 98–111)
Creatinine, Ser: 1.94 mg/dL — ABNORMAL HIGH (ref 0.44–1.00)
GFR, Estimated: 30 mL/min — ABNORMAL LOW
Glucose, Bld: 121 mg/dL — ABNORMAL HIGH (ref 70–99)
Potassium: 3.3 mmol/L — ABNORMAL LOW (ref 3.5–5.1)
Sodium: 144 mmol/L (ref 135–145)

## 2022-06-23 LAB — CBC
HCT: 35.8 % — ABNORMAL LOW (ref 36.0–46.0)
Hemoglobin: 12.3 g/dL (ref 12.0–15.0)
MCH: 29.3 pg (ref 26.0–34.0)
MCHC: 34.4 g/dL (ref 30.0–36.0)
MCV: 85.2 fL (ref 80.0–100.0)
Platelets: 161 10*3/uL (ref 150–400)
RBC: 4.2 MIL/uL (ref 3.87–5.11)
RDW: 13.5 % (ref 11.5–15.5)
WBC: 3.1 10*3/uL — ABNORMAL LOW (ref 4.0–10.5)
nRBC: 0 % (ref 0.0–0.2)

## 2022-06-23 LAB — CBG MONITORING, ED: Glucose-Capillary: 107 mg/dL — ABNORMAL HIGH (ref 70–99)

## 2022-06-23 LAB — SARS CORONAVIRUS 2 BY RT PCR: SARS Coronavirus 2 by RT PCR: POSITIVE — AB

## 2022-06-23 MED ORDER — POTASSIUM CHLORIDE 20 MEQ PO PACK
20.0000 meq | PACK | Freq: Two times a day (BID) | ORAL | Status: DC
  Administered 2022-06-23: 20 meq via ORAL
  Filled 2022-06-23: qty 1

## 2022-06-23 MED ORDER — POTASSIUM CHLORIDE ER 10 MEQ PO TBCR
10.0000 meq | EXTENDED_RELEASE_TABLET | Freq: Every day | ORAL | 6 refills | Status: DC
  Filled 2022-06-23 – 2022-07-14 (×2): qty 30, 30d supply, fill #0

## 2022-06-23 MED ORDER — POTASSIUM CHLORIDE ER 10 MEQ PO TBCR
10.0000 meq | EXTENDED_RELEASE_TABLET | Freq: Every day | ORAL | 0 refills | Status: DC
  Filled 2022-06-23: qty 5, 5d supply, fill #0

## 2022-06-23 NOTE — ED Triage Notes (Signed)
COmplains of covid like symptoms for a few days with body aches and malaise.  Also reporting right eye has been blurry for 2 days and having floats. Hx of retinal detachment in left eye.  No distress noted in triage and pupil equal and reactive.   Also DM and complaining of high blood sugar and having trouble refilling insulin with pharmacy.

## 2022-06-23 NOTE — Discharge Instructions (Signed)
You have been diagnosed with COVID.  You are on day 7 of your symptoms.  If symptoms are improving you may be out of quarantine [place]. Please take potassium as prescribed for the next 5 days have your potassium rechecked with your primary care doctor Follow-up with your eye doctor for blurry vision and immediately of any changes in vision right eye

## 2022-06-23 NOTE — ED Provider Notes (Signed)
[facility] EMERGENCY DEPARTMENT Provider Note   CSN: [id number] Arrival date & time: 06/23/22  0623     History  Chief Complaint  Patient presents with   URI   Medication Refill   Blurred Vision    Adrienne Lane is a 56 y.o. female.  HPI 56 year old female type 1 diabetes, on insulin, history of A-fib, presents today complaining of sore throat and congestion.  She states symptoms began last Thursday.  She has had ongoing nasal congestion, cough, and sore throat.  She denies nausea, vomiting, diarrhea.  She states that she is concerned that she has COVID.  She does not have any known exposures to COVID.  Did not notice fever or chills.  She states she is having some blurriness in her right eye although no definite loss of visual field.  She has been followed by Dr. 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  Patient reports that her blood sugars have been running high.  She has been out of her insulin for the past 1/2 weeks.  She states this is due to inability to have a refill called in.  She called her primary care office today told her they were calling it in but she has not actually received the medication.    Home Medications Prior to Admission medications   Medication Sig Start Date End Date Taking? Authorizing Provider  acetaminophen (TYLENOL) 500 MG tablet Take 500 mg by mouth every 6 (six) hours as needed for moderate pain or headache.   Yes [provider]  amLODipine (NORVASC) 10 MG tablet Take 1 tablet (10 mg total) by mouth daily. 09/29/21  Yes Bensimhon, Shaune Pascal, MD  aspirin EC 81 MG tablet Take 81 mg by mouth daily. Swallow whole.   Yes [provider]  atorvastatin (LIPITOR) 80 MG tablet Take 1 tablet (80 mg total) by mouth at bedtime. 05/21/22  Yes Bensimhon, Shaune Pascal, MD  Bromfenac Sodium (PROLENSA) 0.07 % SOLN Place 1 drop into the left eye 4 (four) times  daily. 10/30/21  Yes Bernarda Caffey, MD  carvedilol (COREG) 6.25 MG tablet Take 1 tablet (6.25 mg total) by mouth 2 (two) times daily with a meal. 09/29/21  Yes Bensimhon, Shaune Pascal, MD  cholecalciferol (VITAMIN D3) 25 MCG (1000 UNIT) tablet Take 1,000 Units by mouth daily.   Yes [provider]  dapagliflozin propanediol (FARXIGA) 10 MG TABS tablet Take 1 tablet (10 mg total) by mouth daily before breakfast. 06/07/22  Yes Bensimhon, Shaune Pascal, MD  hydrALAZINE (APRESOLINE) 100 MG tablet Take 1 tablet (100 mg total) by mouth 3 (three) times daily. 09/29/21  Yes Bensimhon, Shaune Pascal, MD  insulin aspart (NOVOLOG) 100 UNIT/ML injection Use as directed using sliding scale three times daily. Patient taking differently: Inject 2-5 Units into the skin 3 (three) times daily with meals. Use as directed using sliding scale three times daily. 2 units to 5 units 05/22/21  Yes Nicolette Bang, MD  insulin glargine (LANTUS) 100 UNIT/ML injection inject 25 by subcutaneous route as per insulin protocol for 30 days 04/16/22  Yes   isosorbide mononitrate (IMDUR) 60 MG 24 hr tablet Take 1 tablet (60 mg total) by mouth daily. 05/21/22  Yes Bensimhon, Shaune Pascal, MD  ketorolac (ACULAR) 0.5 % ophthalmic solution Place 1 drop into the left eye 4 (four) times daily. 05/31/22 05/31/23 Yes Bernarda Caffey, MD  mexiletine (MEXITIL) 150  MG capsule TAKE 2 CAPSULES (300 MG TOTAL) BY MOUTH EVERY 12 (TWELVE) HOURS. Patient taking differently: Take 300 mg by mouth 2 (two) times daily. 09/15/21 09/15/22 Yes Bensimhon, Shaune Pascal, MD  Multiple Vitamins-Minerals (WOMENS MULTI PO) Take 1 tablet by mouth daily.   Yes [provider]  Multiple Vitamins-Minerals (ZINC PO) Take 1 capsule by mouth daily.   Yes [provider]  NIFEdipine (PROCARDIA XL/NIFEDICAL XL) 60 MG 24 hr tablet Take 1 tablet (60 mg total) by mouth daily. 05/14/22  Yes   potassium chloride (KLOR-CON) 10 MEQ tablet Take 1 tablet (10 mEq total) by mouth  daily. 06/23/22  Yes Pattricia Boss, MD  rivaroxaban (XARELTO) 20 MG TABS tablet Take 1 tablet (20 mg total) by mouth daily with supper. 05/04/22  Yes Bensimhon, Shaune Pascal, MD  sacubitril-valsartan (ENTRESTO) 24-26 MG Take 1 tablet by mouth 2 (two) times daily. 04/22/21  Yes Bensimhon, Shaune Pascal, MD  spironolactone (ALDACTONE) 25 MG tablet Take 1 tablet (25 mg total) by mouth daily. 06/15/22  Yes Bensimhon, Shaune Pascal, MD  Turmeric (QC TUMERIC COMPLEX PO) Take 1 capsule by mouth daily.   Yes [provider]  glucose blood (TRUE METRIX BLOOD GLUCOSE TEST) test strip Use to check fasting blood glucose 3 times daily 02/07/22     Insulin Syringe-Needle U-100 (TRUEPLUS INSULIN SYRINGE) 30G X 5/16" 0.3 ML MISC USE AS DIRECTED 3 TIMES DAILY. 12/28/21 12/28/22  Dorna Mai, MD  Insulin Syringe-Needle U-100 30G X 5/16" 0.5 ML MISC Use as directed. 04/09/21   Argentina Donovan, PA-C  Lancets [facility] ULTRASOFT) lancets Check CBG twice a day 01/21/21   Nicolette Bang, MD  metroNIDAZOLE (FLAGYL) 500 MG tablet take 1 tablet (500 mg) by oral route every 12 hours for 7 days Patient not taking: Reported on 06/23/2022 04/05/22     NIFEdipine (ADALAT CC) 60 MG 24 hr tablet take 1 tablet (60 mg) by oral route once daily for 30 days Patient not taking: Reported on 06/23/2022 04/01/22     oxyCODONE-acetaminophen (PERCOCET/ROXICET) 5-325 MG tablet Take 1-2 tablets by mouth every 6 (six) hours as needed for severe pain. Patient not taking: Reported on 06/23/2022 01/25/22   Lajean Saver, MD  potassium chloride (KLOR-CON) 10 MEQ tablet Take 1 tablet (10 mEq total) by mouth daily. 06/23/22   Pattricia Boss, MD  prednisoLONE acetate (PRED FORTE) 1 % ophthalmic suspension Place 1 drop into the left eye 4 (four) times daily. Patient not taking: Reported on 06/23/2022 05/31/22   Bernarda Caffey, MD  TRUEplus Lancets 28G MISC Use to check fasting blood glucose 3 times daily 02/07/22     ELIQUIS 5 MG TABS tablet TAKE 1 TABLET (5 MG TOTAL) BY  MOUTH 2 (TWO) TIMES DAILY. 12/24/20 12/27/20  Bensimhon, Shaune Pascal, MD  torsemide (DEMADEX) 20 MG tablet TAKE 2 TABLETS (40 MG TOTAL) BY MOUTH 2 (TWO) TIMES DAILY. 11/24/20 12/27/20  Darrick Grinder D, NP      Allergies    Adhesive [tape]    Review of Systems   Review of Systems  Physical Exam Updated Vital Signs BP (!) 142/70   Pulse 68   Temp 98.6 F (37 C) (Oral)   Resp 17   Ht 1.727 m ('5\' 8"'$ )   Wt 108.9 kg   LMP 05/12/2020 (Exact Date)   SpO2 100%   BMI 36.49 kg/m  Physical Exam Vitals and nursing note reviewed.  Constitutional:      General: She is not in acute distress.  Appearance: Normal appearance. She is obese. She is not ill-appearing.  HENT:     Head: Normocephalic and atraumatic.     Right Ear: External ear normal.     Left Ear: External ear normal.     Nose: Nose normal.     Mouth/Throat:     Mouth: Mucous membranes are moist.     Pharynx: Oropharynx is clear.     Comments: No exudate or erythema noted Eyes:     Extraocular Movements: Extraocular movements intact.     Conjunctiva/sclera: Conjunctivae normal.     Pupils: Pupils are equal, round, and reactive to light.     Visual Fields: Right eye visual fields normal and left eye visual fields normal.  Cardiovascular:     Rate and Rhythm: Normal rate. Rhythm irregular.  Pulmonary:     Effort: Pulmonary effort is normal.     Breath sounds: Normal breath sounds.  Abdominal:     General: Abdomen is flat.     Palpations: Abdomen is soft.  Musculoskeletal:     Cervical back: Normal range of motion.  Neurological:     Mental Status: She is alert.     ED Results / Procedures / Treatments   Labs (all labs ordered are listed, but only abnormal results are displayed) Labs Reviewed  SARS CORONAVIRUS 2 BY RT PCR - Abnormal; Notable for the following components:      Result Value   SARS Coronavirus 2 by RT PCR POSITIVE (*)    All other components within normal limits  CBC - Abnormal; Notable for the following  components:   WBC 3.1 (*)    HCT 35.8 (*)    All other components within normal limits  BASIC METABOLIC PANEL - Abnormal; Notable for the following components:   Potassium 3.3 (*)    Glucose, Bld 121 (*)    BUN 23 (*)    Creatinine, Ser 1.94 (*)    GFR, Estimated 30 (*)    All other components within normal limits  CBG MONITORING, ED - Abnormal; Notable for the following components:   Glucose-Capillary 107 (*)    All other components within normal limits    EKG EKG Interpretation  Date/Time:  Wednesday June 23 2022 09:34:48 EDT Ventricular Rate:  82 PR Interval:  170 QRS Duration: 98 QT Interval:  417 QTC Calculation: 487 R Axis:   5 Text Interpretation: Sinus rhythm  frequent pvcs Probable left ventricular hypertrophy Confirmed by Pattricia Boss [phone]) on 06/23/2022 9:52:54 AM  Radiology DG Chest Port 1 View  Result Date: 06/23/2022 CLINICAL DATA:  cough EXAM: PORTABLE CHEST 1 VIEW COMPARISON:  January 24, 2022 FINDINGS: The heart size and mediastinal contours are within normal limits. Both lungs are clear. The visualized skeletal structures are unremarkable. IMPRESSION: No active disease. Electronically Signed   By: Frazier Richards M.D.   On: 06/23/2022 10:11    Procedures Procedures    Medications Ordered in ED Medications  potassium chloride (KLOR-CON) packet 20 mEq (has no administration in time range)    ED Course/ Medical Decision Making/ A&P Clinical Course as of 06/23/22 1223  Wed Jun 23, 2022  1023 Chest x-Hien Cunliffe reviewed interpreted within normal limits radiologist interpretation concurs [DR]  1131 CBC(!) CBC reviewed interpreted mild leukopenia white blood cell count 3100 hemoglobin and platelets normal [DR]  1937 Metabolic panel reviewed interpreted and improved mild hypokalemia and CKD stable [DR]    Clinical Course User Index [DR] Pattricia Boss, MD  Medical Decision Making 56 yo female with multiple health problems presents today  with cc of uri 1-cough congestion-DDX covid vs other viral infections vs bacterial vs pneumonia Covid, cbc, cxr ordered Patient not candidate for paxlovid based on being on day 7 of symptoms  2- right eye blurry- no visual field cut, patient followed by Dr. [name] will advise close f/u 3- hyperglycemia- noncompliance with medication regimen due to no refills Covid postiive on day 7 of symptoms   Amount and/or Complexity of Data Reviewed Labs: ordered. Decision-making details documented in ED Course. Radiology: ordered and independent interpretation performed. Decision-making details documented in ED Course.           Final Clinical Impression(s) / ED Diagnoses Final diagnoses:  COVID  Hypokalemia    Rx / DC Orders ED Discharge Orders          Ordered    potassium chloride (KLOR-CON) 10 MEQ tablet  Daily        06/23/22 1221    potassium chloride (KLOR-CON) 10 MEQ tablet  Daily       Note to Pharmacy: HF FUND   06/23/22 1221              Pattricia Boss, MD 06/23/22 1223

## 2022-06-23 NOTE — ED Notes (Signed)
Phlebotomy asked to draw blood.

## 2022-06-24 ENCOUNTER — Other Ambulatory Visit: Payer: Self-pay

## 2022-06-24 ENCOUNTER — Encounter (INDEPENDENT_AMBULATORY_CARE_PROVIDER_SITE_OTHER): Admitting: Ophthalmology

## 2022-06-24 DIAGNOSIS — H35033 Hypertensive retinopathy, bilateral: Secondary | ICD-10-CM

## 2022-06-24 DIAGNOSIS — E113513 Type 2 diabetes mellitus with proliferative diabetic retinopathy with macular edema, bilateral: Secondary | ICD-10-CM

## 2022-06-24 DIAGNOSIS — H3342 Traction detachment of retina, left eye: Secondary | ICD-10-CM

## 2022-06-24 DIAGNOSIS — H25811 Combined forms of age-related cataract, right eye: Secondary | ICD-10-CM

## 2022-06-24 DIAGNOSIS — H4311 Vitreous hemorrhage, right eye: Secondary | ICD-10-CM

## 2022-06-24 DIAGNOSIS — H4312 Vitreous hemorrhage, left eye: Secondary | ICD-10-CM

## 2022-06-24 DIAGNOSIS — Z961 Presence of intraocular lens: Secondary | ICD-10-CM

## 2022-06-24 DIAGNOSIS — I10 Essential (primary) hypertension: Secondary | ICD-10-CM

## 2022-06-24 DIAGNOSIS — H33311 Horseshoe tear of retina without detachment, right eye: Secondary | ICD-10-CM

## 2022-06-25 ENCOUNTER — Other Ambulatory Visit (HOSPITAL_COMMUNITY): Payer: Self-pay

## 2022-06-30 ENCOUNTER — Encounter (INDEPENDENT_AMBULATORY_CARE_PROVIDER_SITE_OTHER): Admitting: Ophthalmology

## 2022-07-02 ENCOUNTER — Encounter (HOSPITAL_COMMUNITY): Payer: Self-pay

## 2022-07-05 ENCOUNTER — Other Ambulatory Visit (HOSPITAL_COMMUNITY): Payer: Self-pay

## 2022-07-05 ENCOUNTER — Encounter (INDEPENDENT_AMBULATORY_CARE_PROVIDER_SITE_OTHER): Admitting: Ophthalmology

## 2022-07-05 ENCOUNTER — Other Ambulatory Visit (HOSPITAL_COMMUNITY): Payer: Self-pay | Admitting: Internal Medicine

## 2022-07-05 ENCOUNTER — Other Ambulatory Visit: Payer: Self-pay

## 2022-07-05 MED ORDER — MEXILETINE HCL 150 MG PO CAPS
ORAL_CAPSULE | ORAL | 6 refills | Status: DC
  Filled 2022-07-05: qty 120, 30d supply, fill #0
  Filled 2022-07-26: qty 120, 30d supply, fill #1
  Filled 2022-09-14 – 2022-10-01 (×2): qty 120, 30d supply, fill #2
  Filled 2022-11-19: qty 120, 30d supply, fill #3
  Filled 2023-01-31 – 2023-02-15 (×2): qty 120, 30d supply, fill #4
  Filled 2023-02-15: qty 120, 30d supply, fill #0
  Filled 2023-05-02: qty 120, 30d supply, fill #1
  Filled 2023-06-21: qty 120, 30d supply, fill #2

## 2022-07-06 ENCOUNTER — Other Ambulatory Visit: Payer: Self-pay

## 2022-07-14 ENCOUNTER — Other Ambulatory Visit (HOSPITAL_COMMUNITY): Payer: Self-pay | Admitting: Internal Medicine

## 2022-07-14 ENCOUNTER — Encounter (INDEPENDENT_AMBULATORY_CARE_PROVIDER_SITE_OTHER): Admitting: Ophthalmology

## 2022-07-14 ENCOUNTER — Emergency Department (HOSPITAL_COMMUNITY): Payer: Medicare Other

## 2022-07-14 ENCOUNTER — Encounter (INDEPENDENT_AMBULATORY_CARE_PROVIDER_SITE_OTHER): Payer: MEDICAID | Admitting: Ophthalmology

## 2022-07-14 ENCOUNTER — Other Ambulatory Visit (HOSPITAL_COMMUNITY): Payer: Self-pay

## 2022-07-14 ENCOUNTER — Other Ambulatory Visit: Payer: Self-pay

## 2022-07-14 ENCOUNTER — Emergency Department (HOSPITAL_COMMUNITY)
Admission: EM | Admit: 2022-07-14 | Discharge: 2022-07-14 | Disposition: A | Discharge status: Home / Self Care | Payer: Medicare Other | Attending: Emergency Medicine | Admitting: Emergency Medicine

## 2022-07-14 ENCOUNTER — Encounter (HOSPITAL_COMMUNITY): Payer: Self-pay

## 2022-07-14 DIAGNOSIS — Z79899 Other long term (current) drug therapy: Secondary | ICD-10-CM | POA: Diagnosis not present

## 2022-07-14 DIAGNOSIS — Z961 Presence of intraocular lens: Secondary | ICD-10-CM

## 2022-07-14 DIAGNOSIS — I10 Essential (primary) hypertension: Secondary | ICD-10-CM

## 2022-07-14 DIAGNOSIS — Z794 Long term (current) use of insulin: Secondary | ICD-10-CM | POA: Insufficient documentation

## 2022-07-14 DIAGNOSIS — H25811 Combined forms of age-related cataract, right eye: Secondary | ICD-10-CM

## 2022-07-14 DIAGNOSIS — H3342 Traction detachment of retina, left eye: Secondary | ICD-10-CM

## 2022-07-14 DIAGNOSIS — U071 COVID-19: Secondary | ICD-10-CM | POA: Insufficient documentation

## 2022-07-14 DIAGNOSIS — N189 Chronic kidney disease, unspecified: Secondary | ICD-10-CM | POA: Insufficient documentation

## 2022-07-14 DIAGNOSIS — Z7982 Long term (current) use of aspirin: Secondary | ICD-10-CM | POA: Diagnosis not present

## 2022-07-14 DIAGNOSIS — Z7984 Long term (current) use of oral hypoglycemic drugs: Secondary | ICD-10-CM | POA: Insufficient documentation

## 2022-07-14 DIAGNOSIS — I130 Hypertensive heart and chronic kidney disease with heart failure and stage 1 through stage 4 chronic kidney disease, or unspecified chronic kidney disease: Secondary | ICD-10-CM | POA: Insufficient documentation

## 2022-07-14 DIAGNOSIS — Z7901 Long term (current) use of anticoagulants: Secondary | ICD-10-CM | POA: Insufficient documentation

## 2022-07-14 DIAGNOSIS — E113593 Type 2 diabetes mellitus with proliferative diabetic retinopathy without macular edema, bilateral: Secondary | ICD-10-CM

## 2022-07-14 DIAGNOSIS — I509 Heart failure, unspecified: Secondary | ICD-10-CM | POA: Insufficient documentation

## 2022-07-14 DIAGNOSIS — H4311 Vitreous hemorrhage, right eye: Secondary | ICD-10-CM

## 2022-07-14 DIAGNOSIS — U099 Post covid-19 condition, unspecified: Secondary | ICD-10-CM

## 2022-07-14 DIAGNOSIS — H35033 Hypertensive retinopathy, bilateral: Secondary | ICD-10-CM

## 2022-07-14 DIAGNOSIS — R059 Cough, unspecified: Secondary | ICD-10-CM | POA: Diagnosis present

## 2022-07-14 DIAGNOSIS — E113513 Type 2 diabetes mellitus with proliferative diabetic retinopathy with macular edema, bilateral: Secondary | ICD-10-CM

## 2022-07-14 DIAGNOSIS — H4312 Vitreous hemorrhage, left eye: Secondary | ICD-10-CM

## 2022-07-14 DIAGNOSIS — H33311 Horseshoe tear of retina without detachment, right eye: Secondary | ICD-10-CM

## 2022-07-14 LAB — CBC
HCT: 36.9 % (ref 36.0–46.0)
Hemoglobin: 12.1 g/dL (ref 12.0–15.0)
MCH: 28.6 pg (ref 26.0–34.0)
MCHC: 32.8 g/dL (ref 30.0–36.0)
MCV: 87.2 fL (ref 80.0–100.0)
Platelets: 200 10*3/uL (ref 150–400)
RBC: 4.23 MIL/uL (ref 3.87–5.11)
RDW: 14.5 % (ref 11.5–15.5)
WBC: 4.6 10*3/uL (ref 4.0–10.5)
nRBC: 0 % (ref 0.0–0.2)

## 2022-07-14 LAB — BASIC METABOLIC PANEL
Anion gap: 7 (ref 5–15)
BUN: 23 mg/dL — ABNORMAL HIGH (ref 6–20)
CO2: 22 mmol/L (ref 22–32)
Calcium: 8.9 mg/dL (ref 8.9–10.3)
Chloride: 112 mmol/L — ABNORMAL HIGH (ref 98–111)
Creatinine, Ser: 1.72 mg/dL — ABNORMAL HIGH (ref 0.44–1.00)
GFR, Estimated: 35 mL/min — ABNORMAL LOW (ref >60)
Glucose, Bld: 166 mg/dL — ABNORMAL HIGH (ref 70–99)
Potassium: 3.3 mmol/L — ABNORMAL LOW (ref 3.5–5.1)
Sodium: 141 mmol/L (ref 135–145)

## 2022-07-14 MED ORDER — BENZONATATE 100 MG PO CAPS
100.0000 mg | ORAL_CAPSULE | Freq: Three times a day (TID) | ORAL | 0 refills | Status: DC
  Filled 2022-07-14: qty 21, 7d supply, fill #0

## 2022-07-14 MED ORDER — BENZONATATE 100 MG PO CAPS
100.0000 mg | ORAL_CAPSULE | Freq: Once | ORAL | Status: AC
  Administered 2022-07-14: 100 mg via ORAL
  Filled 2022-07-14: qty 1

## 2022-07-14 MED ORDER — CARVEDILOL 6.25 MG PO TABS
6.2500 mg | ORAL_TABLET | Freq: Two times a day (BID) | ORAL | 3 refills | Status: DC
  Filled 2022-07-14 – 2022-07-23 (×2): qty 60, 30d supply, fill #0
  Filled 2022-09-02: qty 60, 30d supply, fill #1
  Filled 2022-10-27: qty 60, 30d supply, fill #2
  Filled 2022-12-12: qty 60, 30d supply, fill #3
  Filled 2023-02-26: qty 60, 30d supply, fill #4
  Filled 2023-05-05: qty 60, 30d supply, fill #5
  Filled 2023-06-21: qty 60, 30d supply, fill #6

## 2022-07-14 NOTE — Discharge Instructions (Signed)
You have been evaluated for your symptoms.  Fortunately chest x-ray obtained today did not show any signs of pneumonia.  Your labs are reassuring.  Your ongoing cough is likely a residual effect of recent COVID infection.  You may take cough medication as prescribed and follow-up with your doctor for further care.  Return if you have any concern.

## 2022-07-14 NOTE — ED Triage Notes (Signed)
Pt states she was positive for COVID a couple weeks ago and was seen at [facility]. Pt reports continued diarrhea, productive cough, denies fever

## 2022-07-14 NOTE — ED Provider Notes (Signed)
[facility] EMERGENCY DEPARTMENT Provider Note   CSN: [id number] Arrival date & time: 07/14/22  1045     History  Chief Complaint  Patient presents with   Covid Positive    Adrienne Lane is a 56 y.o. female.  The history is provided by the patient and medical records. No language interpreter was used.    56 year old female significant history of diabetes, A-fib on Xarelto, obesity, hypertension, CKD previously tested positive for COVID presenting with complaint of cough.  Patient reports she was diagnosed positive for COVID 3 weeks ago.  Since then she still endorsing ongoing occasional productive cough with some sputum and some loose stools.  Symptoms seem to be improving but not fully resolved with concerns today.  She does not endorse any fever no significant shortness of breath no significant pain no body aches and no hemoptysis.  She has had 2 prior COVID-vaccine.  She is compliant with all of her medication.  Home Medications Prior to Admission medications   Medication Sig Start Date End Date Taking? Authorizing Provider  acetaminophen (TYLENOL) 500 MG tablet Take 500 mg by mouth every 6 (six) hours as needed for moderate pain or headache.    [provider]  amLODipine (NORVASC) 10 MG tablet Take 1 tablet (10 mg total) by mouth daily. 09/29/21   Bensimhon, Shaune Pascal, MD  aspirin EC 81 MG tablet Take 81 mg by mouth daily. Swallow whole.    [provider]  atorvastatin (LIPITOR) 80 MG tablet Take 1 tablet (80 mg total) by mouth at bedtime. 05/21/22   Bensimhon, Shaune Pascal, MD  Bromfenac Sodium (PROLENSA) 0.07 % SOLN Place 1 drop into the left eye 4 (four) times daily. 10/30/21   Bernarda Caffey, MD  carvedilol (COREG) 6.25 MG tablet Take 1 tablet (6.25 mg total) by mouth 2 (two) times daily with a meal. 07/14/22   Bensimhon, Shaune Pascal, MD  cholecalciferol (VITAMIN D3) 25 MCG (1000 UNIT) tablet Take 1,000 Units by mouth daily.    [provider]  dapagliflozin propanediol (FARXIGA) 10 MG TABS tablet Take 1 tablet (10 mg total) by mouth daily before breakfast. 06/07/22   Bensimhon, Shaune Pascal, MD  glucose blood (TRUE METRIX BLOOD GLUCOSE TEST) test strip Use to check fasting blood glucose 3 times daily 02/07/22     hydrALAZINE (APRESOLINE) 100 MG tablet Take 1 tablet (100 mg total) by mouth 3 (three) times daily. 09/29/21   Bensimhon, Shaune Pascal, MD  insulin aspart (NOVOLOG) 100 UNIT/ML injection Use as directed using sliding scale three times daily. Patient taking differently: Inject 2-5 Units into the skin 3 (three) times daily with meals. Use as directed using sliding scale three times daily. 2 units to 5 units 05/22/21   Nicolette Bang, MD  insulin glargine (LANTUS) 100 UNIT/ML injection inject 25 by subcutaneous route as per insulin protocol for 30 days 04/16/22     Insulin Syringe-Needle U-100 (TRUEPLUS INSULIN SYRINGE) 30G X 5/16" 0.3 ML MISC USE AS DIRECTED 3 TIMES DAILY. 12/28/21 12/28/22  Dorna Mai, MD  Insulin Syringe-Needle U-100 30G X 5/16" 0.5 ML MISC Use as directed. 04/09/21   Argentina Donovan, PA-C  isosorbide mononitrate (IMDUR) 60 MG 24 hr tablet Take 1 tablet (60 mg total) by mouth daily. 05/21/22   Bensimhon, Shaune Pascal, MD  ketorolac (ACULAR) 0.5 % ophthalmic solution Place 1 drop into the left eye 4 (four) times daily. 05/31/22 05/31/23  Bernarda Caffey, MD  Lancets [facility] ULTRASOFT) lancets Check CBG  twice a day 01/21/21   Nicolette Bang, MD  metroNIDAZOLE (FLAGYL) 500 MG tablet take 1 tablet (500 mg) by oral route every 12 hours for 7 days Patient not taking: Reported on 06/23/2022 04/05/22     mexiletine (MEXITIL) 150 MG capsule TAKE 2 CAPSULES (300 MG TOTAL) BY MOUTH EVERY 12 (TWELVE) HOURS. 07/05/22 07/05/23  Bensimhon, Shaune Pascal, MD  Multiple Vitamins-Minerals (WOMENS MULTI PO) Take 1 tablet by mouth daily.    [provider]  Multiple Vitamins-Minerals (ZINC PO) Take 1 capsule by mouth daily.     [provider]  NIFEdipine (ADALAT CC) 60 MG 24 hr tablet take 1 tablet (60 mg) by oral route once daily for 30 days Patient not taking: Reported on 06/23/2022 04/01/22     NIFEdipine (PROCARDIA XL/NIFEDICAL XL) 60 MG 24 hr tablet Take 1 tablet (60 mg total) by mouth daily. 05/14/22     oxyCODONE-acetaminophen (PERCOCET/ROXICET) 5-325 MG tablet Take 1-2 tablets by mouth every 6 (six) hours as needed for severe pain. Patient not taking: Reported on 06/23/2022 01/25/22   Lajean Saver, MD  potassium chloride (KLOR-CON) 10 MEQ tablet Take 1 tablet (10 mEq total) by mouth daily. 06/23/22   Pattricia Boss, MD  prednisoLONE acetate (PRED FORTE) 1 % ophthalmic suspension Place 1 drop into the left eye 4 (four) times daily. Patient not taking: Reported on 06/23/2022 05/31/22   Bernarda Caffey, MD  rivaroxaban (XARELTO) 20 MG TABS tablet Take 1 tablet (20 mg total) by mouth daily with supper. 05/04/22   Bensimhon, Shaune Pascal, MD  sacubitril-valsartan (ENTRESTO) 24-26 MG Take 1 tablet by mouth 2 (two) times daily. 04/22/21   Bensimhon, Shaune Pascal, MD  spironolactone (ALDACTONE) 25 MG tablet Take 1 tablet (25 mg total) by mouth daily. 06/15/22   Bensimhon, Shaune Pascal, MD  TRUEplus Lancets 28G MISC Use to check fasting blood glucose 3 times daily 02/07/22     Turmeric (QC TUMERIC COMPLEX PO) Take 1 capsule by mouth daily.    [provider]  ELIQUIS 5 MG TABS tablet TAKE 1 TABLET (5 MG TOTAL) BY MOUTH 2 (TWO) TIMES DAILY. 12/24/20 12/27/20  Bensimhon, Shaune Pascal, MD  torsemide (DEMADEX) 20 MG tablet TAKE 2 TABLETS (40 MG TOTAL) BY MOUTH 2 (TWO) TIMES DAILY. 11/24/20 12/27/20  Conrad Clifton Hill, NP      Allergies    Adhesive [tape]    Review of Systems   Review of Systems  All other systems reviewed and are negative.   Physical Exam Updated Vital Signs BP (!) 141/111 (BP Location: Left Arm)   Pulse 91   Temp 97.8 F (36.6 C) (Oral)   Resp 16   Ht '5\' 8"'$  (1.727 m)   Wt 106.6 kg   LMP 05/12/2020 (Exact Date)   SpO2  100%   BMI 35.73 kg/m  Physical Exam Vitals and nursing note reviewed.  Constitutional:      General: She is not in acute distress.    Appearance: She is well-developed.  HENT:     Head: Atraumatic.  Eyes:     Conjunctiva/sclera: Conjunctivae normal.  Cardiovascular:     Rate and Rhythm: Normal rate and regular rhythm.  Pulmonary:     Effort: Pulmonary effort is normal.     Breath sounds: No wheezing, rhonchi or rales.  Abdominal:     General: Abdomen is flat.     Palpations: Abdomen is soft.     Tenderness: There is no abdominal tenderness.  Musculoskeletal:  Cervical back: Neck supple.  Skin:    Findings: No rash.  Neurological:     Mental Status: She is alert.  Psychiatric:        Mood and Affect: Mood normal.     ED Results / Procedures / Treatments   Labs (all labs ordered are listed, but only abnormal results are displayed) Labs Reviewed  BASIC METABOLIC PANEL - Abnormal; Notable for the following components:      Result Value   Potassium 3.3 (*)    Chloride 112 (*)    Glucose, Bld 166 (*)    BUN 23 (*)    Creatinine, Ser 1.72 (*)    GFR, Estimated 35 (*)    All other components within normal limits  CBC    EKG None  Radiology DG Chest 2 View  Result Date: 07/14/2022 CLINICAL DATA:  Shortness of breath and cough. History of recent COVID. EXAM: CHEST - 2 VIEW COMPARISON:  06/23/2022 FINDINGS: The cardiac silhouette, mediastinal and hilar contours are normal. The lungs are clear with an acute process. No infiltrates or effusions. The bony thorax is intact. IMPRESSION: No acute cardiopulmonary findings. Electronically Signed   By: Marijo Sanes M.D.   On: 07/14/2022 11:37    Procedures Procedures    Medications Ordered in ED Medications  benzonatate (TESSALON) capsule 100 mg (has no administration in time range)    ED Course/ Medical Decision Making/ A&P                           Medical Decision Making  BP (!) 141/111 (BP Location: Left  Arm)   Pulse 91   Temp 97.8 F (36.6 C) (Oral)   Resp 16   Ht '5\' 8"'$  (1.727 m)   Wt 106.6 kg   LMP 05/12/2020 (Exact Date)   SpO2 100%   BMI 35.73 kg/m   12:43 PM  This is a 56 year old female with multiple comorbidities which include CHF, A-fib on Xarelto, diabetes, hypertension, previously diagnosed positive for COVID-19 approximately 3 weeks ago presenting with residual COVID symptoms.  She endorses still having some occasional bouts of loose stools, cough occasional productive with some sputum and was concerns if this is the expected course of her disease.  She does not endorse any significant fever, body aches, worsening shortness of breath, nausea or vomiting.  She has had 2 prior COVID-vaccine.  On exam, patient is resting comfortably appears to be in no acute discomfort.  She is afebrile  and vital signs are reassuring.  Labs and imaging obtained independently viewed interpreted by me.  She has normal WBC, no anemia, chest x-ray reviewed showing no acute cardiopulmonary findings.  Patient without hypoxia to suggest PE.  Furthermore she is currently on Xarelto for her A-fib. Her creatinine is 1.72, similar to prior.  This patient presents to the ED for concern of cough, this involves an extensive number of treatment options, and is a complaint that carries with it a high risk of complications and morbidity.  The differential diagnosis includes residual covid, pna, pe, asthma, copd exacerbation, bronchiectasis.   Co morbidities that complicate the patient evaluation chf  Afib  dm Additional history obtained:  Additional history obtained from pt External records from outside source obtained and reviewed including prior EMR including labs and imaging  Lab Tests:  I Ordered, and personally interpreted labs.  The pertinent results include:  as above  Imaging Studies ordered:  I ordered imaging studies including  cxr I independently visualized and interpreted imaging which showed  no pna I agree with the radiologist interpretation  Cardiac Monitoring:  The patient was maintained on a cardiac monitor.  I personally viewed and interpreted the cardiac monitored which showed an underlying rhythm of: NSR  Medicines ordered and prescription drug management:  I ordered medication including tessalone  for cough Reevaluation of the patient after these medicines showed that the patient improved I have reviewed the patients home medicines and have made adjustments as needed  Test Considered: as above  Critical Interventions: none   Problem List / ED Course: cough  Covid infection  Reevaluation:  After the interventions noted above, I reevaluated the patient and found that they have :improved  Social Determinants of Health: none  Dispostion:  After consideration of the diagnostic results and the patients response to treatment, I feel that the patent would benefit from outpt f/u.         Final Clinical Impression(s) / ED Diagnoses Final diagnoses:  None    Rx / DC Orders ED Discharge Orders     None         Domenic Moras, PA-C 07/14/22 1307    Noemi Chapel, MD 07/15/22 [phone]

## 2022-07-19 ENCOUNTER — Other Ambulatory Visit (HOSPITAL_COMMUNITY): Payer: Self-pay

## 2022-07-19 NOTE — Progress Notes (Signed)
[facility] Note  07/23/2022     CHIEF COMPLAINT Patient presents for Retina Follow Up  HISTORY OF PRESENT ILLNESS: Adrienne Lane is a 56 y.o. female who presents to the clinic today for:  HPI     Retina Follow Up   Patient presents with  Diabetic Retinopathy.  In both eyes.  This started 6 weeks ago.  I, the attending physician,  performed the HPI with the patient and updated documentation appropriately.        Comments   Patient here for 6 weeks (10 weeks) retina follow up for PDR OU. Patient states vision seems ok. Seeing better. Not foggy anymore. No floaters anymore. No eye pain. They get dry. Uses drops. Ran out of gray top drops and very little left of white top drops.      Last edited by Bernarda Caffey, MD on 07/23/2022  1:13 PM.    Pt is delayed to follow up from 6 weeks to 10 weeks due to having to re-apply for benefits and having covid, pt states she is completely out of ketorolac and also most out of PF  Referring physician: [facility], [facility] [place],  [place] [id number]  HISTORICAL INFORMATION:   Selected notes from the MEDICAL RECORD NUMBER Referred by Dr. Wyatt Portela for concern of VH   CURRENT MEDICATIONS: Current Outpatient Medications (Ophthalmic Drugs)  Medication Sig   Bromfenac Sodium (PROLENSA) 0.07 % SOLN Place 1 drop into the left eye 4 (four) times daily.   ketorolac (ACULAR) 0.5 % ophthalmic solution Place 1 drop into the left eye 4 (four) times daily.   prednisoLONE acetate (PRED FORTE) 1 % ophthalmic suspension Place 1 drop into the left eye 4 (four) times daily.   No current facility-administered medications for this visit. (Ophthalmic Drugs)   Current Outpatient Medications (Other)  Medication Sig   acetaminophen (TYLENOL) 500 MG tablet Take 500 mg by mouth every 6 (six) hours as needed for moderate pain or headache.   amLODipine (NORVASC) 10 MG tablet Take 1 tablet (10 mg  total) by mouth daily.   aspirin EC 81 MG tablet Take 81 mg by mouth daily. Swallow whole.   atorvastatin (LIPITOR) 80 MG tablet Take 1 tablet (80 mg total) by mouth at bedtime.   benzonatate (TESSALON) 100 MG capsule Take 1 capsule (100 mg total) by mouth every 8 (eight) hours.   carvedilol (COREG) 6.25 MG tablet Take 1 tablet (6.25 mg total) by mouth 2 (two) times daily with a meal.   cholecalciferol (VITAMIN D3) 25 MCG (1000 UNIT) tablet Take 1,000 Units by mouth daily.   dapagliflozin propanediol (FARXIGA) 10 MG TABS tablet Take 1 tablet (10 mg total) by mouth daily before breakfast.   glucose blood (TRUE METRIX BLOOD GLUCOSE TEST) test strip Use to check fasting blood glucose 3 times daily   hydrALAZINE (APRESOLINE) 100 MG tablet Take 1 tablet (100 mg total) by mouth 3 (three) times daily.   insulin aspart (NOVOLOG) 100 UNIT/ML injection Use as directed using sliding scale three times daily. (Patient taking differently: Inject 2-5 Units into the skin 3 (three) times daily with meals. Use as directed using sliding scale three times daily. 2 units to 5 units)   insulin glargine (LANTUS) 100 UNIT/ML injection inject 25 by subcutaneous route as per insulin protocol for 30 days   Insulin Syringe-Needle U-100 (TRUEPLUS INSULIN SYRINGE) 30G X 5/16" 0.3 ML MISC USE AS DIRECTED 3 TIMES  DAILY.   Insulin Syringe-Needle U-100 30G X 5/16" 0.5 ML MISC Use as directed.   isosorbide mononitrate (IMDUR) 60 MG 24 hr tablet Take 1 tablet (60 mg total) by mouth daily.   Lancets (ONETOUCH ULTRASOFT) lancets Check CBG twice a day   mexiletine (MEXITIL) 150 MG capsule TAKE 2 CAPSULES (300 MG TOTAL) BY MOUTH EVERY 12 (TWELVE) HOURS.   Multiple Vitamins-Minerals (WOMENS MULTI PO) Take 1 tablet by mouth daily.   Multiple Vitamins-Minerals (ZINC PO) Take 1 capsule by mouth daily.   NIFEdipine (PROCARDIA XL/NIFEDICAL XL) 60 MG 24 hr tablet Take 1 tablet (60 mg total) by mouth daily.   potassium chloride (KLOR-CON) 10  MEQ tablet Take 1 tablet (10 mEq total) by mouth daily.   rivaroxaban (XARELTO) 20 MG TABS tablet Take 1 tablet (20 mg total) by mouth daily with supper.   sacubitril-valsartan (ENTRESTO) 24-26 MG Take 1 tablet by mouth 2 (two) times daily.   spironolactone (ALDACTONE) 25 MG tablet Take 1 tablet (25 mg total) by mouth daily.   TRUEplus Lancets 28G MISC Use to check fasting blood glucose 3 times daily   Turmeric (QC TUMERIC COMPLEX PO) Take 1 capsule by mouth daily.   metroNIDAZOLE (FLAGYL) 500 MG tablet take 1 tablet (500 mg) by oral route every 12 hours for 7 days (Patient not taking: Reported on 06/23/2022)   NIFEdipine (ADALAT CC) 60 MG 24 hr tablet take 1 tablet (60 mg) by oral route once daily for 30 days (Patient not taking: Reported on 06/23/2022)   oxyCODONE-acetaminophen (PERCOCET/ROXICET) 5-325 MG tablet Take 1-2 tablets by mouth every 6 (six) hours as needed for severe pain. (Patient not taking: Reported on 06/23/2022)   No current facility-administered medications for this visit. (Other)   REVIEW OF SYSTEMS: ROS   Positive for: Genitourinary, Endocrine, Cardiovascular, Eyes, Respiratory Negative for: Constitutional, Gastrointestinal, Neurological, Skin, Musculoskeletal, HENT, Psychiatric, Allergic/Imm, Heme/Lymph Last edited by Theodore Demark, COA on 07/23/2022  9:43 AM.     ALLERGIES Allergies  Allergen Reactions   Adhesive [Tape]     Tears skin, can tolerate paper tape   PAST MEDICAL HISTORY Past Medical History:  Diagnosis Date   Atrial fibrillation with RVR ([place])    Bigeminy 01/2020   Cataract    CHF (congestive heart failure) ([place]) 10/20/2019   CKD (chronic kidney disease), stage IV (HCC)    Diabetes mellitus without complication ([place])    Diabetic retinopathy ([place])    DOE (dyspnea on exertion)    walking upstairs or up hill resolves in one minute   Dysrhythmia    a-fib - on pradaxa   Fibroids    History of kidney stones    History of recent blood transfusion  02/26/2020   Hypertension    Hypertensive retinopathy    Iron deficiency anemia    Non-ischemic cardiomyopathy ([place])    tachycardia induced   Obese    Peripheral edema    Premature ventricular contractions (PVCs) ([facility])    Sleep apnea    does not use cpap   Stroke ([place]) 16/1096   Umbilical hernia    Wears glasses    Past Surgical History:  Procedure Laterality Date   CATARACT EXTRACTION     CHOLECYSTECTOMY     CYSTOSCOPY W/ URETERAL STENT PLACEMENT Bilateral 05/24/2020   Procedure: CYSTOSCOPY WITH RETROGRADE PYELOGRAM/URETERAL STENT PLACEMENT;  Surgeon: Robley Fries, MD;  Location: [facility];  Service: Urology;  Laterality: Bilateral;   CYSTOSCOPY/RETROGRADE/URETEROSCOPY Bilateral 04/03/2020   Procedure: CYSTOSCOPY/RETROGRADE/URETEROSCOPY;  Surgeon: Louis Meckel,  Viona Gilmore, MD;  Location: [facility];  Service: Urology;  Laterality: Bilateral;   EYE SURGERY Left 08/2021   cataract removed   HYSTERECTOMY ABDOMINAL WITH SALPINGO-OOPHORECTOMY  07/21/2020   Procedure: HYSTERECTOMY ABDOMINAL WITH BILATERAL SALPINGO-OOPHORECTOMY;  Surgeon: Sanjuana Kava, MD;  Location: [facility];  Service: Gynecology;;   INJECTION OF SILICONE OIL Left 93/26/7124   Procedure: INJECTION OF SILICONE OIL;  Surgeon: Bernarda Caffey, MD;  Location: [facility];  Service: Ophthalmology;  Laterality: Left;   IR FLUORO GUIDE CV LINE RIGHT  02/21/2020   IR US GUIDE VASC ACCESS RIGHT  02/21/2020   MEMBRANE PEEL Left 10/08/2021   Procedure: MEMBRANE PEEL;  Surgeon: Bernarda Caffey, MD;  Location: [facility];  Service: Ophthalmology;  Laterality: Left;   NEPHROLITHOTOMY Left 02/14/2020   Procedure: NEPHROLITHOTOMY PERCUTANEOUS/ SURGEON ACCESS/ LEFT PERCUTANEOUS NEPHROSTOMY TUBE PLACEMENT;  Surgeon: Ardis Hughs, MD;  Location: [facility];  Service: Urology;  Laterality: Left;   NEPHROLITHOTOMY Left 02/21/2020   Procedure: NEPHROLITHOTOMY PERCUTANEOUS SECOND LOOK;  Surgeon: Ardis Hughs, MD;  Location: [facility];  Service: Urology;   Laterality: Left;   NEPHROLITHOTOMY Left 02/26/2020   Procedure: NEPHROLITHOTOMY PERCUTANEOUS;  Surgeon: Ceasar Mons, MD;  Location: [facility];  Service: Urology;  Laterality: Left;  NEED 150 MIN   NEPHROLITHOTOMY Right 03/24/2020   Procedure: NEPHROLITHOTOMY PERCUTANEOUS WITH ACCESS LEFT STENT REMOVAL;  Surgeon: Ardis Hughs, MD;  Location[facility];  Service: Urology;  Laterality: Right;   PARS PLANA VITRECTOMY Left 10/08/2021   Procedure: PARS PLANA VITRECTOMY WITH 25 GAUGE;  Surgeon: Bernarda Caffey, MD;  Location: [facility];  Service: Ophthalmology;  Laterality: Left;   PHOTOCOAGULATION WITH LASER Left 10/08/2021   Procedure: PHOTOCOAGULATION WITH LASER;  Surgeon: Bernarda Caffey, MD;  Location: [facility];  Service: Ophthalmology;  Laterality: Left;   RIGHT HEART CATH N/A 11/09/2019   Procedure: RIGHT HEART CATH;  Surgeon: Jolaine Artist, MD;  Location: [facility];  Service: Cardiovascular;  Laterality: N/A;   WISDOM TOOTH EXTRACTION     FAMILY HISTORY Family History  Problem Relation Age of Onset   Atrial fibrillation Mother    Hypertension Mother    Stroke Mother 18   Diabetes Mother    Diabetes Father    Hypertension Father    Diabetes Sister    Hypertension Sister    Diabetes Brother    Hypertension Brother    Diabetes Brother    Heart attack Brother    Stroke Maternal Grandmother 42   SOCIAL HISTORY Social History   Tobacco Use   Smoking status: Never   Smokeless tobacco: Never  Vaping Use   Vaping Use: Never used  Substance Use Topics   Alcohol use: Never   Drug use: Never       OPHTHALMIC EXAM:  Base Eye Exam     Visual Acuity (Snellen - Linear)       Right Left   Dist cc 20/20 -1 CF at 3'   Dist ph cc  20/400 -1    Correction: Glasses         Tonometry (Tonopen, 9:39 AM)       Right Left   Pressure 13 21         Pupils       Dark Light Shape React APD   Right 3 2 Round Brisk None   Left 3 2 Round Brisk +1          Visual Fields (Counting fingers)  Left Right     Full   Restrictions Partial outer superior temporal, inferior temporal, superior nasal, inferior nasal deficiencies          Extraocular Movement       Right Left    Full, Ortho Full, Ortho         Neuro/Psych     Oriented x3: Yes   Mood/Affect: Normal         Dilation     Both eyes: 1.0% Mydriacyl, 2.5% Phenylephrine @ 9:39 AM           Slit Lamp and Fundus Exam     External Exam       Right Left   External Normal Normal         Slit Lamp Exam       Right Left   Lids/Lashes Dermatochalasis - upper lid Dermatochalasis - upper lid   Conjunctiva/Sclera Melanosis sutures dissolved, mild melanosis   Cornea mild arcus, trace PEE mild arcus, well healed cataract wound, 1+ Punctate epithelial erosions   Anterior Chamber Deep and clear Deep and quiet, 1-2+ cell and pigment   Iris Round and dilated, No NVI Round and dilated, No NVI   Lens 1-2+ Nuclear sclerosis, 1-2+ Cortical cataract, fine pigment deposition on posterior capsule PC IOL in good position, pigment deposition on optic, 1-2+PCO   Anterior Vitreous Vitreous syneresis, +RBC, blood stained vitreous condensations - slightly improved and settling inferiorly post vitrectomy, good oil fill, +pigment         Fundus Exam       Right Left   Disc hazy view - improved, Pink and Sharp, Compact 1+pallor, sharp nasal rim, +temporal edema - improved   C/D Ratio 0.5 0.2   Macula hazy view - improved, Good foveal reflex, rare MA Flat under oil, fibrosis improved, scattered IRH/DBH, residual fibrosis and thickening nasal macula -- slightly increased   Vessels attenuated, Tortuous, peripheral sclerosis and fibrosis Severe attenuation / sclerosis, +fibrosis improved, tortuosity   Periphery Attached, scattered patches of fibrosis and NVE, small flap tear at 0130 equator--no SRF, good laser surrounding, old VH settled inferiorly - improving; PRP laser changes  Attached, Scattered DBH; good PRP changes 360; scattered fibrosis -- improved           Refraction     Wearing Rx       Sphere Cylinder Axis   Right -2.50 +1.75 178   Left -2.50 +1.25 018           IMAGING AND PROCEDURES  Imaging and Procedures for 07/23/2022  OCT, Retina - OU - Both Eyes       Right Eye Quality was good. Central Foveal Thickness: 220. Progression has been stable. Findings include normal foveal contour, no IRF, no SRF, intraretinal hyper-reflective material (Mild interval improvement in vitreous opacities / VH, Persistent fibrosis of nasal disc; macula and fovea stable).   Left Eye Quality was good. Central Foveal Thickness: 388. Progression has worsened. Findings include no SRF, abnormal foveal contour, intraretinal fluid, macular pucker, inner retinal atrophy, outer retinal atrophy (Retina attached under oil; diffuse atrophy; mild interval increase in IRF / edema nasal macula).   Notes *Images captured and stored on drive  Diagnosis / Impression:  OD: Mild interval improvement in vitreous opacities / VH, Persistent fibrosis of nasal disc; macula and fovea stable OS: Retina attached under oil; diffuse atrophy; mild interval increase in IRF / edema nasal macula  Clinical management:  See below  Abbreviations: NFP - Normal foveal  profile. CME - cystoid macular edema. PED - pigment epithelial detachment. IRF - intraretinal fluid. SRF - subretinal fluid. EZ - ellipsoid zone. ERM - epiretinal membrane. ORA - outer retinal atrophy. ORT - outer retinal tubulation. SRHM - subretinal hyper-reflective material. IRHM - intraretinal hyper-reflective material      Intravitreal Injection, Pharmacologic Agent - OD - Right Eye       Time Out 07/23/2022. 10:20 AM. Confirmed correct patient, procedure, site, and patient consented.   Anesthesia Topical anesthesia was used. Anesthetic medications included Lidocaine 2%, Lidocaine 4%, Proparacaine 0.5%.    Procedure Preparation included 5% betadine to ocular surface, eyelid speculum. A supplied (33g) needle was used.   Injection: 1.25 mg Bevacizumab 1.'25mg'$ /0.46m   Route: Intravitreal, Site: Right Eye   NDC: [id number] Lot:: [id number] Expiration date: 10/22/2022   Post-op Post injection exam found visual acuity of at least counting fingers. The patient tolerated the procedure well. There were no complications. The patient received written and verbal post procedure care education. Post injection medications were not given.      Intravitreal Injection, Pharmacologic Agent - OS - Left Eye       Time Out 07/23/2022. 10:20 AM. Confirmed correct patient, procedure, site, and patient consented.   Anesthesia Topical anesthesia was used. Anesthetic medications included Lidocaine 2%, Proparacaine 0.5%.   Procedure Preparation included 5% betadine to ocular surface, eyelid speculum. A (32g) needle was used.   Injection: 1.25 mg Bevacizumab 1.'25mg'$ /0.032m  Route: Intravitreal, Site: Left Eye   NDC: [id number]: : [id number] date: 09/07/2022   Post-op Post injection exam found visual acuity of at least counting fingers, no retinal detachment. The patient tolerated the procedure well. There were no complications. The patient received written and verbal post procedure care education. Post injection medications were not given.            ASSESSMENT/PLAN:    ICD-10-CM   1. Proliferative diabetic retinopathy of both eyes with macular edema associated with type 2 diabetes mellitus (HCC)  E11.3513 OCT, Retina - OU - Both Eyes    Intravitreal Injection, Pharmacologic Agent - OD - Right Eye    Intravitreal Injection, Pharmacologic Agent - OS - Left Eye    2. Vitreous hemorrhage of right eye ([place] H43.11     3. Vitreous hemorrhage of left eye (HCC)  H43.12     4. Traction detachment of left retina  H33.42     5. Retinal tear of right eye  H33.311     6. Essential  hypertension  I10     7. Hypertensive retinopathy of both eyes  H35.033     8. Combined forms of age-related cataract of right eye  H25.811     9. Pseudophakia  Z96.1      1-4. Proliferative diabetic retinopathy OU  - delayed f/u -- 10 wks instead of 6  - s/p PRP OD (09.19.22)  - s/p IVA OD #1 (12.23.22), #2 (01.27.23), #3 (02.24.23), #4 (03.24.23), #5 (04.21.23), #6 (05.24.23)  - s/p IVA OS #1 (01.27.23), #2 (02.24.23), #3 (03.24.23), #4 (04.21.23), #5 (05.24.23)             - OD: w/ +NVD  - OS: with chronic, diffuse VH and underlying TRD - exam shows +NVD and scattered fibrosis OD,              - s/p PPV/MP/EL/FAX/silicone oil ([id number]) OS 10.20.22             - intra-op -- retina beneath  chronic, dense VH was severely ischemic, florid fibrosis w/ +360 posterior TRD w/ stretch holes             - good oil fill, retina attached under oil; fibrosis improved, and good laser changes in place  - BCVA remains CF OS             - IOP 13,21  - OCT shows OD: Mild interval improvement in vitreous opacities / VH, Persistent fibrosis of nasal disc; macula and fovea stable; OS: Retina attached under oil; diffuse atrophy; mild interval increase in IRF / edema nasal macula             - cont PF QID OS    Ketorolac QID OS  - samples of Lotemax SM and Prolensa given today             - drop instructions reviewed             - Recommend IVA OD #7 and OS #6 today, 08.04.23 for persistent DME            - pt wishes to proceed with injection  - RBA of procedure discussed, questions answered - informed consent obtained and signed - see procedure note - IVA informed consent obtained and signed, 12.23.22 (OD) - informed consent obtained and signed, 01.27.23 (OS)             - F/u 6 wks w/DFE/OCT/poss. Injection  2.VH OD - New floaters noticed on 12.23.22 exam - likely related to history of PDR and HTN  - s/p IVA OD #1 (12.23.22), #2 (01.27.23), #3 (02.24.23), #4 (03.24.23), #5 (04.21.23), #6  (05.24.23)  - today, stable improvement in VH and BCVA 20/20 OD - recommend IVA OD #7 today, 08.04.23 for PDR as above - IVA informed consent obtained and signed, 12.23.22 (OD) - see procedure note - f/u 4 weeks w/DFE/OCT/likely injection  5. Retinal tear, OD - small flap tear located at 0130 equator, no SRF - lasered with PRP OD as above on 09.19.22  6,7. Hypertensive retinopathy OU - discussed importance of tight BP control - monitor    8. Mixed Cataract OD - The symptoms of cataract, surgical options, and treatments and risks were discussed with patient.  - discussed diagnosis and progression - under the expert management of Dr. Wyatt Portela  9. Pseudophakia OS  - s/p CE/IOL (Dr. Katy Fitch)  - IOL in good position, doing well  - monitor   Ophthalmic Meds Ordered this visit:  Meds ordered this encounter  Medications   prednisoLONE acetate (PRED FORTE) 1 % ophthalmic suspension    Sig: Place 1 drop into the left eye 4 (four) times daily.    Dispense:  15 mL    Refill:  3   ketorolac (ACULAR) 0.5 % ophthalmic solution    Sig: Place 1 drop into the left eye 4 (four) times daily.    Dispense:  10 mL    Refill:  3     Return in about 6 weeks (around 09/03/2022) for f/u PDR OU, DFE, OCT.  There are no Patient Instructions on file for this visit.  This document serves as a record of services personally performed by Gardiner Sleeper, MD, PhD. It was created on their behalf by San Jetty. Owens Shark, OA an ophthalmic technician. The creation of this record is the provider's dictation and/or activities during the visit.    Electronically signed by: San Jetty. [place], [place] 07.31.2023 1:13 PM   Gardiner Sleeper,  M.D., Ph.D. Diseases & Surgery of the Retina and [place]  I have reviewed the above documentation for accuracy and completeness, and I agree with the above. Gardiner Sleeper, M.D., Ph.D. 07/23/22 1:15 PM   Abbreviations: M myopia (nearsighted); A  astigmatism; H hyperopia (farsighted); P presbyopia; Mrx spectacle prescription;  CTL contact lenses; OD right eye; OS left eye; OU both eyes  XT exotropia; ET esotropia; PEK punctate epithelial keratitis; PEE punctate epithelial erosions; DES dry eye syndrome; MGD meibomian gland dysfunction; ATs artificial tears; PFAT's preservative free artificial tears; [place] nuclear sclerotic cataract; PSC posterior subcapsular cataract; ERM epi-retinal membrane; PVD posterior vitreous detachment; RD retinal detachment; DM diabetes mellitus; DR diabetic retinopathy; NPDR non-proliferative diabetic retinopathy; PDR proliferative diabetic retinopathy; CSME clinically significant macular edema; DME diabetic macular edema; dbh dot blot hemorrhages; CWS cotton wool spot; POAG primary open angle glaucoma; C/D cup-to-disc ratio; HVF humphrey visual field; GVF goldmann visual field; OCT optical coherence tomography; IOP intraocular pressure; BRVO Branch retinal vein occlusion; CRVO central retinal vein occlusion; CRAO central retinal artery occlusion; BRAO branch retinal artery occlusion; RT retinal tear; SB scleral buckle; PPV pars plana vitrectomy; VH Vitreous hemorrhage; PRP panretinal laser photocoagulation; IVK intravitreal kenalog; VMT vitreomacular traction; MH Macular hole;  NVD neovascularization of the disc; NVE neovascularization elsewhere; AREDS age related eye disease study; ARMD age related macular degeneration; POAG primary open angle glaucoma; EBMD epithelial/anterior basement membrane dystrophy; ACIOL anterior chamber intraocular lens; IOL intraocular lens; PCIOL posterior chamber intraocular lens; Phaco/IOL phacoemulsification with intraocular lens placement; [place] photorefractive keratectomy; LASIK laser assisted in situ keratomileusis; HTN hypertension; DM diabetes mellitus; COPD chronic obstructive pulmonary disease

## 2022-07-21 ENCOUNTER — Other Ambulatory Visit: Payer: Self-pay

## 2022-07-22 ENCOUNTER — Other Ambulatory Visit (HOSPITAL_COMMUNITY): Payer: Self-pay

## 2022-07-23 ENCOUNTER — Other Ambulatory Visit (HOSPITAL_COMMUNITY): Payer: Self-pay

## 2022-07-23 ENCOUNTER — Encounter (INDEPENDENT_AMBULATORY_CARE_PROVIDER_SITE_OTHER): Payer: Self-pay | Admitting: Ophthalmology

## 2022-07-23 ENCOUNTER — Ambulatory Visit (INDEPENDENT_AMBULATORY_CARE_PROVIDER_SITE_OTHER): Admitting: Ophthalmology

## 2022-07-23 ENCOUNTER — Other Ambulatory Visit: Payer: Self-pay

## 2022-07-23 DIAGNOSIS — H3342 Traction detachment of retina, left eye: Secondary | ICD-10-CM

## 2022-07-23 DIAGNOSIS — I10 Essential (primary) hypertension: Secondary | ICD-10-CM | POA: Diagnosis not present

## 2022-07-23 DIAGNOSIS — H35033 Hypertensive retinopathy, bilateral: Secondary | ICD-10-CM

## 2022-07-23 DIAGNOSIS — H4313 Vitreous hemorrhage, bilateral: Secondary | ICD-10-CM | POA: Diagnosis not present

## 2022-07-23 DIAGNOSIS — H4311 Vitreous hemorrhage, right eye: Secondary | ICD-10-CM

## 2022-07-23 DIAGNOSIS — H33311 Horseshoe tear of retina without detachment, right eye: Secondary | ICD-10-CM | POA: Diagnosis not present

## 2022-07-23 DIAGNOSIS — Z961 Presence of intraocular lens: Secondary | ICD-10-CM

## 2022-07-23 DIAGNOSIS — E113513 Type 2 diabetes mellitus with proliferative diabetic retinopathy with macular edema, bilateral: Secondary | ICD-10-CM

## 2022-07-23 DIAGNOSIS — H4312 Vitreous hemorrhage, left eye: Secondary | ICD-10-CM

## 2022-07-23 DIAGNOSIS — H25811 Combined forms of age-related cataract, right eye: Secondary | ICD-10-CM

## 2022-07-23 MED ORDER — BEVACIZUMAB CHEMO INJECTION 1.25MG/0.05ML SYRINGE FOR KALEIDOSCOPE
1.2500 mg | INTRAVITREAL | Status: AC | PRN
  Administered 2022-07-23: 1.25 mg via INTRAVITREAL

## 2022-07-23 MED ORDER — KETOROLAC TROMETHAMINE 0.5 % OP SOLN
1.0000 [drp] | Freq: Four times a day (QID) | OPHTHALMIC | 3 refills | Status: DC
  Filled 2022-07-23: qty 10, 40d supply, fill #0
  Filled 2022-07-26: qty 10, 50d supply, fill #0

## 2022-07-23 MED ORDER — PREDNISOLONE ACETATE 1 % OP SUSP
1.0000 [drp] | Freq: Four times a day (QID) | OPHTHALMIC | 3 refills | Status: DC
  Filled 2022-07-23: qty 10, 40d supply, fill #0

## 2022-07-26 ENCOUNTER — Other Ambulatory Visit: Payer: Self-pay | Admitting: Physician Assistant

## 2022-07-26 ENCOUNTER — Other Ambulatory Visit (INDEPENDENT_AMBULATORY_CARE_PROVIDER_SITE_OTHER): Payer: Self-pay

## 2022-07-26 ENCOUNTER — Other Ambulatory Visit: Payer: Self-pay

## 2022-07-26 MED ORDER — KETOROLAC TROMETHAMINE 0.5 % OP SOLN: 1.0000 [drp] | Freq: Four times a day (QID) | OPHTHALMIC | 3 refills | Status: DC

## 2022-07-26 MED ORDER — PREDNISOLONE ACETATE 1 % OP SUSP: 1.0000 [drp] | Freq: Four times a day (QID) | OPHTHALMIC | 3 refills | Status: DC

## 2022-07-27 ENCOUNTER — Other Ambulatory Visit: Payer: Self-pay

## 2022-07-27 NOTE — Telephone Encounter (Signed)
Requested medication (s) are due for refill today -expired Rx  Requested medication (s) are on the active medication list -yes  Future visit scheduled -no  Last refill: 04/09/21 #100 11RF  Notes to clinic: expired Rx, patient may no longer be current at office  Requested Prescriptions  Pending Prescriptions Disp Refills   Insulin Syringe-Needle U-100 (ULTICARE INSULIN SYRINGE) 30G X 5/16" 0.5 ML MISC 100 each 11    Sig: Use as directed.     There is no refill protocol information for this order       Requested Prescriptions  Pending Prescriptions Disp Refills   Insulin Syringe-Needle U-100 (ULTICARE INSULIN SYRINGE) 30G X 5/16" 0.5 ML MISC 100 each 11    Sig: Use as directed.     There is no refill protocol information for this order      c

## 2022-07-28 ENCOUNTER — Other Ambulatory Visit: Payer: Self-pay

## 2022-08-09 ENCOUNTER — Encounter (HOSPITAL_COMMUNITY): Payer: Self-pay

## 2022-08-09 ENCOUNTER — Ambulatory Visit (HOSPITAL_COMMUNITY)
Admission: RE | Admit: 2022-08-09 | Discharge: 2022-08-09 | Disposition: A | Discharge status: Home / Self Care | Payer: Medicare Other | Source: Ambulatory Visit | Attending: Family Medicine | Admitting: Family Medicine

## 2022-08-09 ENCOUNTER — Other Ambulatory Visit (HOSPITAL_COMMUNITY): Payer: Self-pay

## 2022-08-09 VITALS — BP 126/88 | HR 71 | Ht 68.0 in | Wt 241.0 lb

## 2022-08-09 DIAGNOSIS — D259 Leiomyoma of uterus, unspecified: Secondary | ICD-10-CM | POA: Insufficient documentation

## 2022-08-09 DIAGNOSIS — Z7984 Long term (current) use of oral hypoglycemic drugs: Secondary | ICD-10-CM | POA: Diagnosis not present

## 2022-08-09 DIAGNOSIS — I493 Ventricular premature depolarization: Secondary | ICD-10-CM | POA: Insufficient documentation

## 2022-08-09 DIAGNOSIS — N184 Chronic kidney disease, stage 4 (severe): Secondary | ICD-10-CM | POA: Insufficient documentation

## 2022-08-09 DIAGNOSIS — I3139 Other pericardial effusion (noninflammatory): Secondary | ICD-10-CM | POA: Diagnosis not present

## 2022-08-09 DIAGNOSIS — Z794 Long term (current) use of insulin: Secondary | ICD-10-CM

## 2022-08-09 DIAGNOSIS — I5022 Chronic systolic (congestive) heart failure: Secondary | ICD-10-CM | POA: Diagnosis not present

## 2022-08-09 DIAGNOSIS — I480 Paroxysmal atrial fibrillation: Secondary | ICD-10-CM | POA: Insufficient documentation

## 2022-08-09 DIAGNOSIS — E1122 Type 2 diabetes mellitus with diabetic chronic kidney disease: Secondary | ICD-10-CM | POA: Insufficient documentation

## 2022-08-09 DIAGNOSIS — N132 Hydronephrosis with renal and ureteral calculous obstruction: Secondary | ICD-10-CM | POA: Insufficient documentation

## 2022-08-09 DIAGNOSIS — D219 Benign neoplasm of connective and other soft tissue, unspecified: Secondary | ICD-10-CM

## 2022-08-09 DIAGNOSIS — Z79899 Other long term (current) drug therapy: Secondary | ICD-10-CM | POA: Insufficient documentation

## 2022-08-09 DIAGNOSIS — I6350 Cerebral infarction due to unspecified occlusion or stenosis of unspecified cerebral artery: Secondary | ICD-10-CM

## 2022-08-09 DIAGNOSIS — I10 Essential (primary) hypertension: Secondary | ICD-10-CM

## 2022-08-09 DIAGNOSIS — R29818 Other symptoms and signs involving the nervous system: Secondary | ICD-10-CM

## 2022-08-09 DIAGNOSIS — E1136 Type 2 diabetes mellitus with diabetic cataract: Secondary | ICD-10-CM | POA: Insufficient documentation

## 2022-08-09 DIAGNOSIS — I130 Hypertensive heart and chronic kidney disease with heart failure and stage 1 through stage 4 chronic kidney disease, or unspecified chronic kidney disease: Secondary | ICD-10-CM | POA: Diagnosis not present

## 2022-08-09 DIAGNOSIS — N183 Chronic kidney disease, stage 3 unspecified: Secondary | ICD-10-CM

## 2022-08-09 DIAGNOSIS — I471 Supraventricular tachycardia: Secondary | ICD-10-CM | POA: Insufficient documentation

## 2022-08-09 DIAGNOSIS — R7989 Other specified abnormal findings of blood chemistry: Secondary | ICD-10-CM | POA: Diagnosis not present

## 2022-08-09 DIAGNOSIS — Z7901 Long term (current) use of anticoagulants: Secondary | ICD-10-CM | POA: Insufficient documentation

## 2022-08-09 LAB — BASIC METABOLIC PANEL
Anion gap: 11 (ref 5–15)
BUN: 24 mg/dL — ABNORMAL HIGH (ref 6–20)
CO2: 22 mmol/L (ref 22–32)
Calcium: 9.2 mg/dL (ref 8.9–10.3)
Chloride: 108 mmol/L (ref 98–111)
Creatinine, Ser: 1.81 mg/dL — ABNORMAL HIGH (ref 0.44–1.00)
GFR, Estimated: 33 mL/min — ABNORMAL LOW (ref >60)
Glucose, Bld: 220 mg/dL — ABNORMAL HIGH (ref 70–99)
Potassium: 3.6 mmol/L (ref 3.5–5.1)
Sodium: 141 mmol/L (ref 135–145)

## 2022-08-09 MED ORDER — SPIRONOLACTONE 25 MG PO TABS
12.5000 mg | ORAL_TABLET | Freq: Every day | ORAL | 0 refills | Status: DC
  Filled 2022-08-09: qty 15, 30d supply, fill #0
  Filled 2022-09-10: qty 15, 30d supply, fill #1
  Filled 2022-10-05: qty 15, 30d supply, fill #2

## 2022-08-09 NOTE — Progress Notes (Signed)
Advanced Heart Failure Clinic Note PCP: Dr. Wynetta Emery  Primary Cardiologist: Dr. Gardiner Rhyme HF MD: Dr. [name] Nephorology: Dr. Joelyn Oms   HPI: Ms Shippee is a 56 y.o. woman with morbid obesity, HTN, DM2, PAF, PVCs, CKD3b and chronic systolic HF.   Admitted to [facility] 10/20 after a mechanical fall. ECHO EF 20-25% with severe RV dysfunction as well. 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 Renal function worsened. CT Renal Stone showed bilateral staghorn calculi without hydronephrosis and severe anasarca. Also found to have uterine mass felt to be large fibroid. Diuresed 90 pounds. Placed on carvedilol and mexiletine due to high PVC burden. Discharged on 11/12/19 with weight 255 pounds.   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 Saw Dr. Lovena Le who was considering PVC ablation. Doubted mexilitene is helping much.  Admitted 6/21 with AKI on CKD. CT scan of the abdomen which showed bilateral hydronephrosis and large fibroid uterus.  Creatinine on presentation was 3.24. Bilateral ureteral stent placement with bilateral retrograde pyelography. Creatinine improved.   07/2020 underwent total abdominal hysterectomy and bilateral salpingoopherectomy  Admitted 1/22 for acute CVA. Presented w/ facial droop and blurry vision. She presented outside of window for TPA, it was not given.  Seen by neurology/stroke team.  Completed work-up for CVA. MRI brain showed acute infarct in the right hemipons, additional small acute infarct in the left frontal subcortical white matter.  Mild associated edema in the right pons without substantial mass-effect. Echo EF 45-50%. Eliquis was switched to Pradaxa. She was discharged to CIR. While in CIR she developed upper respiratory symptoms and tested + for COVID and was readmitted to [facility] under [place].   Follow up 5/22, NYHA II-early III. Zio showed frequent PVCs but EF stable and  asymptomatic. Continued on mexiltiene.    Today she returns for HF follow up with her husband. Overall feeling fine. She has SOB going up and down stairs or walking up inclines. Occasional dizziness but no recent falls. Denies palpitations, CP, abnormal bleeding, edema, or PND. Chronically sleeps reclined due to eye pressure issues. Appetite ok. No fever or chills. Weight at home 235 pounds. Taking all medications. She has disability hearing next month.   Cardiac Studies:  - Zio (4/22): 740 runs SVT, PVCs 21%. PACs 5.6%  - Echo (1/22): EF 45-50%. RV mildly reduced  - Echo (2/21); EF 40-45% hard to assess with PVCs. (read formally as 50-55%) RV mildly down No effusion.   - Echo (11/20: EF 20-25%, pericardial effusion, mild LVH, RA/LA dilated.  RV down.   - RHC 11/12/19  RA = 11 RV = 47/15 PA = 49/17 (32) PCW = 20 Fick cardiac output/index = 8.8/3.8 PVR = 1.4 WU FA sat = 97% PA sat = 70%, 72% High SVC = 68%  ROS: All systems negative except as listed in HPI, PMH and Problem List.  SH:  Social History   Socioeconomic History   Marital status: Married    Spouse name: Not on file   Number of children: 0   Years of education: Not on file   Highest education level: Associate degree: occupational, Hotel manager, or vocational program  Occupational History   Occupation: unemployed  Tobacco Use   Smoking status: Never   Smokeless tobacco: Never  Vaping Use   Vaping Use: Never used  Substance and Sexual Activity   Alcohol use: Never   Drug use: Never   Sexual activity: Yes    Birth  control/protection: Surgical    Comment: Hysterectomy  Other Topics Concern   Not on file  Social History Narrative   Not on file   Social Determinants of Health   Financial Resource Strain: Not on file  Food Insecurity: Not on file  Transportation Needs: No Transportation Needs (05/20/2020)   PRAPARE - Hydrologist (Medical): No    Lack of Transportation  (Non-Medical): No  Physical Activity: Not on file  Stress: Not on file  Social Connections: Not on file  Intimate Partner Violence: Not on file    FH:  Family History  Problem Relation Age of Onset   Atrial fibrillation Mother    Hypertension Mother    Stroke Mother 25   Diabetes Mother    Diabetes Father    Hypertension Father    Diabetes Sister    Hypertension Sister    Diabetes Brother    Hypertension Brother    Diabetes Brother    Heart attack Brother    Stroke Maternal Grandmother 74    Past Medical History:  Diagnosis Date   Atrial fibrillation with RVR ([place])    Bigeminy 01/2020   Cataract    CHF (congestive heart failure) ([place]) 10/20/2019   CKD (chronic kidney disease), stage IV (HCC)    Diabetes mellitus without complication (HCC)    Diabetic retinopathy ([place])    DOE (dyspnea on exertion)    walking upstairs or up hill resolves in one minute   Dysrhythmia    a-fib - on pradaxa   Fibroids    History of kidney stones    History of recent blood transfusion 02/26/2020   Hypertension    Hypertensive retinopathy    Iron deficiency anemia    Non-ischemic cardiomyopathy (HCC)    tachycardia induced   Obese    Peripheral edema    Premature ventricular contractions (PVCs) ([facility])    Sleep apnea    does not use cpap   Stroke ([place]) 18/8416   Umbilical hernia    Wears glasses     Current Outpatient Medications  Medication Sig Dispense Refill   acetaminophen (TYLENOL) 500 MG tablet Take 500 mg by mouth every 6 (six) hours as needed for moderate pain or headache.     amLODipine (NORVASC) 10 MG tablet Take 1 tablet (10 mg total) by mouth daily. 30 tablet 6   aspirin EC 81 MG tablet Take 81 mg by mouth daily. Swallow whole.     atorvastatin (LIPITOR) 80 MG tablet Take 1 tablet (80 mg total) by mouth at bedtime. 30 tablet 3   Bromfenac Sodium (PROLENSA) 0.07 % SOLN Place 1 drop into the left eye 4 (four) times daily. 3 mL 3   carvedilol (COREG) 6.25 MG tablet Take  1 tablet (6.25 mg total) by mouth 2 (two) times daily with a meal. 180 tablet 3   cholecalciferol (VITAMIN D3) 25 MCG (1000 UNIT) tablet Take 1,000 Units by mouth daily.     dapagliflozin propanediol (FARXIGA) 10 MG TABS tablet Take 1 tablet (10 mg total) by mouth daily before breakfast. 30 tablet 6   glucose blood (TRUE METRIX BLOOD GLUCOSE TEST) test strip Use to check fasting blood glucose 3 times daily 50 strip 6   hydrALAZINE (APRESOLINE) 100 MG tablet Take 1 tablet (100 mg total) by mouth 3 (three) times daily. 90 tablet 6   insulin aspart (NOVOLOG) 100 UNIT/ML injection Use as directed using sliding scale three times daily. (Patient taking differently: Inject 2-5 Units  into the skin 3 (three) times daily with meals. Use as directed using sliding scale three times daily. 2 units to 5 units) 10 mL PRN   insulin glargine (LANTUS) 100 UNIT/ML injection inject 25 by subcutaneous route as per insulin protocol for 30 days 10 mL 6   Insulin Syringe-Needle U-100 (TRUEPLUS INSULIN SYRINGE) 30G X 5/16" 0.3 ML MISC USE AS DIRECTED 3 TIMES DAILY. 90 each 1   isosorbide mononitrate (IMDUR) 60 MG 24 hr tablet Take 1 tablet (60 mg total) by mouth daily. 30 tablet 3   ketorolac (ACULAR) 0.5 % ophthalmic solution Place 1 drop into the left eye 4 (four) times daily. 10 mL 3   Lancets (ONETOUCH ULTRASOFT) lancets Check CBG twice a day 60 each 5   mexiletine (MEXITIL) 150 MG capsule TAKE 2 CAPSULES (300 MG TOTAL) BY MOUTH EVERY 12 (TWELVE) HOURS. 120 capsule 6   Multiple Vitamins-Minerals (WOMENS MULTI PO) Take 1 tablet by mouth daily.     Multiple Vitamins-Minerals (ZINC PO) Take 1 capsule by mouth daily.     NIFEdipine (PROCARDIA XL/NIFEDICAL XL) 60 MG 24 hr tablet Take 1 tablet (60 mg total) by mouth daily. 90 tablet 3   potassium chloride (KLOR-CON) 10 MEQ tablet Take 1 tablet (10 mEq total) by mouth daily. 30 tablet 6   rivaroxaban (XARELTO) 20 MG TABS tablet Take 1 tablet (20 mg total) by mouth daily with  supper. 30 tablet 11   sacubitril-valsartan (ENTRESTO) 24-26 MG Take 1 tablet by mouth 2 (two) times daily. 60 tablet 11   TRUEplus Lancets 28G MISC Use to check fasting blood glucose 3 times daily 100 each 3   Turmeric (QC TUMERIC COMPLEX PO) Take 1 capsule by mouth daily.     benzonatate (TESSALON) 100 MG capsule Take 1 capsule (100 mg total) by mouth every 8 (eight) hours. (Patient not taking: Reported on 08/09/2022) 21 capsule 0   Insulin Syringe-Needle U-100 30G X 5/16" 0.5 ML MISC Use as directed. 100 each 11   oxyCODONE-acetaminophen (PERCOCET/ROXICET) 5-325 MG tablet Take 1-2 tablets by mouth every 6 (six) hours as needed for severe pain. (Patient not taking: Reported on 06/23/2022) 15 tablet 0   No current facility-administered medications for this encounter.   BP 126/88   Pulse 71   Ht '5\' 8"'$  (1.727 m)   Wt 109.3 kg (241 lb)   LMP 05/12/2020 (Exact Date)   SpO2 97%   BMI 36.64 kg/m   Wt Readings from Last 3 Encounters:  08/09/22 109.3 kg (241 lb)  07/14/22 106.6 kg (235 lb)  06/23/22 108.9 kg (240 lb)   PHYSICAL EXAM: General:  NAD. No resp difficulty HEENT: Normal Neck: Supple. No JVD. Carotids 2+ bilat; no bruits. No lymphadenopathy or thryomegaly appreciated. Cor: PMI nondisplaced. Regular rate & rhythm. No rubs, gallops or murmurs. Lungs: Clear Abdomen: Soft, nontender, nondistended. No hepatosplenomegaly. No bruits or masses. Good bowel sounds. Extremities: No cyanosis, clubbing, rash, edema Neuro: Alert & oriented x 3, cranial nerves grossly intact. Moves all 4 extremities w/o difficulty. Affect pleasant.  ECG (personally reviewed): SR 65 with LVH, PVCs   ASSESSMENT & PLAN: 1. Chronic Systolic Heart Failure - Echo (11/20):  EF 20-25%, pericardial effusion, mild LVH, RA/LA dilated.  RV . Possible Tachy Mediated cardiomyopathy verus PVC. She has not had LHC due to elevated creatinine.  - Had RHC (11/20) with preserved cardiac output.  - Echo (2/21): EF 40-45% (Hard  to assess with PVCs) RV mild HK. No effusion - Echo (1/22):  EF 45-50%. RV mildly reduced  - Stable NYHA II, volume looks good today. - Start spiro 12.5 mg daily and stop KCL. - Continue Entresto 24/26 mg bid. - Continue carvedilol 6.25 mg bid - Continue Farxiga 10 mg daily  - Continue hydralazine 100 mg tid + imdur 60 mg daily.  - Labs today. Repeat BMET in 1 week. - Update echo to ensure EF stability.  2. Frequent PVCs  - Failed amio - On mexilitene 300 mg bid. - Zio Patch 11/2019 Frequent PVCs (19.0%, 42278) with two predominant morphologies (9.8 & 9.3%, respectively).  - Zio 4/22 740 runs SVT, PVCs 21%. PACs 5.6% - Followed by Dr. Lovena Le. Needs follow up, I asked her to call and schedule.   3. PAF  - Maintaining NSR w/ PVCs on ECG  - Continue carvedilol 6.25 mg bid.  - Had CVA w/ Eliquis, now on Xarelto. No bleeding issues.   4. CKD 3b - Baseline SCr 1.72 - Follows with Nephrology - Continue SGLT2i.  - labs today.  5. HTN  - Stable. - GDMT as above.    6. Type 2 DM - Continue Farxiga.   7. Uterine Fibroids  - s/p total abdominal hysterectomy and bilateral salpingoopherectomy  8. Possible OSA with snoring - Needs sleep study - complicated by lack of health insurance - can order when she gets Medicaid (currenlty pending coverage)  - Engage HFSW to help with resources.  9. CVA - CVA 1/21 in setting of Afib - Failed Eliquis, now on Xarelto. - Much improved. Followed by Dr. Leonie Man.  Follow up in 3-4 months with Dr. [name] + echo.  [place] FNP-BC  9:28 AM

## 2022-08-09 NOTE — Progress Notes (Signed)
H&V Care Navigation CSW Progress Note  Clinical Social Worker met with patient to discuss certain lack of insurance.  Pt reports she has been uninsured for several years as she has tried to get disability/medicaid (has been 3 years)- has a hearing mid September for disability -encouraged her to call social security to confirm what she needs to do to be prepared for this hearing.  Pt reports no other concerns at this time.  Lives with spouse who has disability which supports them. CSW mailing out CAFA to assist with [facility] bills   SDOH Screenings   Alcohol Screen: Not on file  Depression (PHQ2-9): Low Risk  (03/02/2021)   Depression (PHQ2-9)    PHQ-2 Score: 0  Financial Resource Strain: Not on file  Food Insecurity: No Food Insecurity (08/09/2022)   Hunger Vital Sign    Worried About Running Out of Food in the Last Year: Never true    Ran Out of Food in the Last Year: Never true  Housing: Low Risk  (08/09/2022)   Housing    Last Housing Risk Score: 0  Physical Activity: Not on file  Social Connections: Not on file  Stress: Not on file  Tobacco Use: Low Risk  (08/09/2022)   Patient History    Smoking Tobacco Use: Never    Smokeless Tobacco Use: Never    Passive Exposure: Not on file  Transportation Needs: No Transportation Needs (08/09/2022)   PRAPARE - Transportation    Lack of Transportation (Medical): No    Lack of Transportation (Non-Medical): No   Jorge Ny, LCSW Clinical Social Worker Advanced Heart Failure Clinic Desk#: [phone] Cell#: [phone]

## 2022-08-09 NOTE — Patient Instructions (Addendum)
Thank you for coming in today  Labs were done today, if any labs are abnormal the clinic will call you No news is good news  START Spironolactone 12.5 mg 1/2 tablet daily   STOP Potassium  Please follow up with Dr. Lovena Le  Your physician recommends that you schedule a follow-up appointment in:  4 months with echocardiogram with Dr. [name]  Your physician has requested that you have an echocardiogram. Echocardiography is a painless test that uses sound waves to create images of your heart. It provides your doctor with information about the size and shape of your heart and how well your heart's chambers and valves are working. This procedure takes approximately one hour. There are no restrictions for this procedure.     Do the following things EVERYDAY: Weigh yourself in the morning before breakfast. Write it down and keep it in a log. Take your medicines as prescribed Eat low salt foods--Limit salt (sodium) to 2000 mg per day.  Stay as active as you can everyday Limit all fluids for the day to less than 2 liters  At the [facility], you and your health needs are our priority. As part of our continuing mission to provide you with exceptional heart care, we have created designated Provider Care Teams. These Care Teams include your primary Cardiologist (physician) and Advanced Practice Providers (APPs- Physician Assistants and Nurse Practitioners) who all work together to provide you with the care you need, when you need it.   You may see any of the following providers on your designated Care Team at your next follow up: Dr [name] Dr Haynes Kerns, NP Lyda Jester, [place] [facility] [place], [place] Audry Riles, PharmD   Please be sure to bring in all your medications bottles to every appointment.   If you have any questions or concerns before your next appointment please send [place] a message through [place] or call our office at  [phone].    TO LEAVE A MESSAGE FOR THE NURSE SELECT OPTION 2, PLEASE LEAVE A MESSAGE INCLUDING: YOUR NAME DATE OF BIRTH CALL BACK NUMBER REASON FOR CALL**this is important as we prioritize the call backs  YOU WILL RECEIVE A CALL BACK THE SAME DAY AS LONG AS YOU CALL BEFORE 4:00 PM

## 2022-08-11 ENCOUNTER — Other Ambulatory Visit (HOSPITAL_COMMUNITY): Payer: Self-pay

## 2022-08-12 ENCOUNTER — Other Ambulatory Visit (HOSPITAL_COMMUNITY): Payer: Self-pay

## 2022-08-13 ENCOUNTER — Other Ambulatory Visit: Payer: Self-pay

## 2022-08-13 ENCOUNTER — Other Ambulatory Visit (HOSPITAL_BASED_OUTPATIENT_CLINIC_OR_DEPARTMENT_OTHER): Payer: Self-pay

## 2022-08-13 MED ORDER — INSULIN PEN NEEDLE 31G X 5 MM MISC
1.0000 | Freq: Every day | 3 refills | Status: AC
Start: 2022-08-13 — End: ?

## 2022-08-13 MED ORDER — INSULIN GLARGINE 100 UNIT/ML SOLOSTAR PEN
22.0000 [IU] | PEN_INJECTOR | Freq: Every morning | SUBCUTANEOUS | 3 refills | Status: DC
Start: 2022-08-13 — End: 2023-05-06
  Filled 2022-08-16: qty 6, 27d supply, fill #0
  Filled 2022-10-01: qty 6, 27d supply, fill #1
  Filled 2022-11-19: qty 6, 27d supply, fill #2
  Filled 2023-01-16 – 2023-01-26 (×2): qty 6, 27d supply, fill #3

## 2022-08-15 ENCOUNTER — Other Ambulatory Visit: Payer: Self-pay

## 2022-08-15 ENCOUNTER — Emergency Department (HOSPITAL_COMMUNITY)
Admission: EM | Admit: 2022-08-15 | Discharge: 2022-08-15 | Disposition: A | Discharge status: Home / Self Care | Payer: Medicare Other | Attending: Emergency Medicine | Admitting: Emergency Medicine

## 2022-08-15 DIAGNOSIS — Z79899 Other long term (current) drug therapy: Secondary | ICD-10-CM | POA: Diagnosis not present

## 2022-08-15 DIAGNOSIS — Z794 Long term (current) use of insulin: Secondary | ICD-10-CM | POA: Diagnosis not present

## 2022-08-15 DIAGNOSIS — Z7982 Long term (current) use of aspirin: Secondary | ICD-10-CM | POA: Diagnosis not present

## 2022-08-15 DIAGNOSIS — E1122 Type 2 diabetes mellitus with diabetic chronic kidney disease: Secondary | ICD-10-CM | POA: Insufficient documentation

## 2022-08-15 DIAGNOSIS — I130 Hypertensive heart and chronic kidney disease with heart failure and stage 1 through stage 4 chronic kidney disease, or unspecified chronic kidney disease: Secondary | ICD-10-CM | POA: Insufficient documentation

## 2022-08-15 DIAGNOSIS — I509 Heart failure, unspecified: Secondary | ICD-10-CM | POA: Insufficient documentation

## 2022-08-15 DIAGNOSIS — N184 Chronic kidney disease, stage 4 (severe): Secondary | ICD-10-CM | POA: Insufficient documentation

## 2022-08-15 DIAGNOSIS — Z7901 Long term (current) use of anticoagulants: Secondary | ICD-10-CM | POA: Diagnosis not present

## 2022-08-15 DIAGNOSIS — R21 Rash and other nonspecific skin eruption: Secondary | ICD-10-CM | POA: Insufficient documentation

## 2022-08-15 LAB — PROTIME-INR
INR: 1.3 — ABNORMAL HIGH (ref 0.8–1.2)
Prothrombin Time: 16 seconds — ABNORMAL HIGH (ref 11.4–15.2)

## 2022-08-15 LAB — BASIC METABOLIC PANEL
Anion gap: 9 (ref 5–15)
BUN: 28 mg/dL — ABNORMAL HIGH (ref 6–20)
CO2: 21 mmol/L — ABNORMAL LOW (ref 22–32)
Calcium: 9.1 mg/dL (ref 8.9–10.3)
Chloride: 108 mmol/L (ref 98–111)
Creatinine, Ser: 1.9 mg/dL — ABNORMAL HIGH (ref 0.44–1.00)
GFR, Estimated: 31 mL/min — ABNORMAL LOW (ref >60)
Glucose, Bld: 183 mg/dL — ABNORMAL HIGH (ref 70–99)
Potassium: 3.3 mmol/L — ABNORMAL LOW (ref 3.5–5.1)
Sodium: 138 mmol/L (ref 135–145)

## 2022-08-15 LAB — CBC WITH DIFFERENTIAL/PLATELET
Abs Immature Granulocytes: 0.01 10*3/uL (ref 0.00–0.07)
Basophils Absolute: 0 10*3/uL (ref 0.0–0.1)
Basophils Relative: 1 %
Eosinophils Absolute: 0.2 10*3/uL (ref 0.0–0.5)
Eosinophils Relative: 3 %
HCT: 37.7 % (ref 36.0–46.0)
Hemoglobin: 12.6 g/dL (ref 12.0–15.0)
Immature Granulocytes: 0 %
Lymphocytes Relative: 27 %
Lymphs Abs: 1.5 10*3/uL (ref 0.7–4.0)
MCH: 29.2 pg (ref 26.0–34.0)
MCHC: 33.4 g/dL (ref 30.0–36.0)
MCV: 87.3 fL (ref 80.0–100.0)
Monocytes Absolute: 0.4 10*3/uL (ref 0.1–1.0)
Monocytes Relative: 7 %
Neutro Abs: 3.5 10*3/uL (ref 1.7–7.7)
Neutrophils Relative %: 62 %
Platelets: 194 10*3/uL (ref 150–400)
RBC: 4.32 MIL/uL (ref 3.87–5.11)
RDW: 14.4 % (ref 11.5–15.5)
WBC: 5.6 10*3/uL (ref 4.0–10.5)
nRBC: 0 % (ref 0.0–0.2)

## 2022-08-15 NOTE — Discharge Instructions (Signed)
You were seen in the emergency room today due to a painless discoloration on your leg.  We do not think this is a large hematoma that requires further attention.  If your lesion becomes painful or if you start to experience symptoms in your left leg please see your primary care physician.  Please keep your upcoming appointment with your cardiologist.

## 2022-08-15 NOTE — ED Triage Notes (Addendum)
Pt here from home after noticing a hematoma to L shin. Pt denies falls/injuries, denies pain. Pt is on xarelto. Pt reports gait abnormality at baseline due to previous CVA.

## 2022-08-15 NOTE — ED Provider Notes (Signed)
[facility] EMERGENCY DEPARTMENT Provider Note   CSN: [id number] Arrival date & time: 08/15/22  1429     History  Chief Complaint  Patient presents with   hematoma to leg    Adrienne Lane is a 56 y.o. female with past medical history of atrial fibrillation on Xarelto, diabetes, CHF with a last ejection fraction of 20 to 25%, CKD, prior CVA who presents to the emergency room due to concern for bruise on her left shin.  The patient states that her husband had noticed a bruise on her left shin and he is unaware of how she had gotten that and this is what prompted presentation to the emergency department.  The patient states that she does not recall striking her leg on anything and had noticed a small approximate 3 x 3 cm area of hyperpigmentation without any associated pain, pruritus or other symptoms.  She specifically denies fevers or chills.  She notes that she has been compliant with her Xarelto therapy.  She reports no symptoms at this time.  HPI Past Medical History:  Diagnosis Date   Atrial fibrillation with RVR ([place])    Bigeminy 01/2020   Cataract    CHF (congestive heart failure) ([place]) 10/20/2019   CKD (chronic kidney disease), stage IV (H[facility])    Diabetes mellitus without complication ([place])    Diabetic retinopathy ([place])    DOE (dyspnea on exertion)    walking upstairs or up hill resolves in one minute   Dysrhythmia    a-fib - on pradaxa   Fibroids    History of kidney stones    History of recent blood transfusion 02/26/2020   Hypertension    Hypertensive retinopathy    Iron deficiency anemia    Non-ischemic cardiomyopathy (HCC)    tachycardia induced   Obese    Peripheral edema    Premature ventricular contractions (PVCs) ([facility])    Sleep apnea    does not use cpap   Stroke ([place]) 17/5102   Umbilical hernia    Wears glasses        Home Medications Prior to Admission medications   Medication Sig Start Date End Date Taking? Authorizing Provider   acetaminophen (TYLENOL) 500 MG tablet Take 500 mg by mouth every 6 (six) hours as needed for moderate pain or headache.    [provider]  amLODipine (NORVASC) 10 MG tablet Take 1 tablet (10 mg total) by mouth daily. Patient not taking: Reported on 08/09/2022 09/29/21   Bensimhon, Shaune Pascal, MD  aspirin EC 81 MG tablet Take 81 mg by mouth daily. Swallow whole.    [provider]  atorvastatin (LIPITOR) 80 MG tablet Take 1 tablet (80 mg total) by mouth at bedtime. 05/21/22   Bensimhon, Shaune Pascal, MD  benzonatate (TESSALON) 100 MG capsule Take 1 capsule (100 mg total) by mouth every 8 (eight) hours. Patient not taking: Reported on 08/09/2022 07/14/22   Domenic Moras, PA-C  Bromfenac Sodium (PROLENSA) 0.07 % SOLN Place 1 drop into the left eye 4 (four) times daily. 10/30/21   Bernarda Caffey, MD  carvedilol (COREG) 6.25 MG tablet Take 1 tablet (6.25 mg total) by mouth 2 (two) times daily with a meal. 07/14/22   Bensimhon, Shaune Pascal, MD  cholecalciferol (VITAMIN D3) 25 MCG (1000 UNIT) tablet Take 1,000 Units by mouth daily.    [provider]  dapagliflozin propanediol (FARXIGA) 10 MG TABS tablet Take 1 tablet (10 mg total) by mouth daily before breakfast. 06/07/22  Bensimhon, Shaune Pascal, MD  glucose blood (TRUE METRIX BLOOD GLUCOSE TEST) test strip Use to check fasting blood glucose 3 times daily 02/07/22     hydrALAZINE (APRESOLINE) 100 MG tablet Take 1 tablet (100 mg total) by mouth 3 (three) times daily. 09/29/21   Bensimhon, Shaune Pascal, MD  insulin aspart (NOVOLOG) 100 UNIT/ML injection Use as directed using sliding scale three times daily. Patient taking differently: Inject 2-5 Units into the skin 3 (three) times daily with meals. Use as directed using sliding scale three times daily. 2 units to 5 units 05/22/21   Nicolette Bang, MD  insulin glargine (LANTUS) 100 UNIT/ML injection inject 25 by subcutaneous route as per insulin protocol for 30 days 04/16/22     insulin glargine  (LANTUS) 100 UNIT/ML Solostar Pen Inject 22 Units into the skin every morning. 08/13/22     Insulin Pen Needle 31G X 5 MM MISC Inject 1 each into the skin daily. 08/13/22     Insulin Syringe-Needle U-100 (TRUEPLUS INSULIN SYRINGE) 30G X 5/16" 0.3 ML MISC USE AS DIRECTED 3 TIMES DAILY. 12/28/21 12/28/22  Dorna Mai, MD  Insulin Syringe-Needle U-100 30G X 5/16" 0.5 ML MISC Use as directed. 04/09/21   Argentina Donovan, PA-C  isosorbide mononitrate (IMDUR) 60 MG 24 hr tablet Take 1 tablet (60 mg total) by mouth daily. 05/21/22   Bensimhon, Shaune Pascal, MD  ketorolac (ACULAR) 0.5 % ophthalmic solution Place 1 drop into the left eye 4 (four) times daily. 07/26/22 07/26/23  Bernarda Caffey, MD  Lancets [facility] ULTRASOFT) lancets Check CBG twice a day 01/21/21   Nicolette Bang, MD  mexiletine (MEXITIL) 150 MG capsule TAKE 2 CAPSULES (300 MG TOTAL) BY MOUTH EVERY 12 (TWELVE) HOURS. 07/05/22 07/05/23  Bensimhon, Shaune Pascal, MD  Multiple Vitamins-Minerals (WOMENS MULTI PO) Take 1 tablet by mouth daily.    [provider]  Multiple Vitamins-Minerals (ZINC PO) Take 1 capsule by mouth daily.    [provider]  NIFEdipine (PROCARDIA XL/NIFEDICAL XL) 60 MG 24 hr tablet Take 1 tablet (60 mg total) by mouth daily. 05/14/22     oxyCODONE-acetaminophen (PERCOCET/ROXICET) 5-325 MG tablet Take 1-2 tablets by mouth every 6 (six) hours as needed for severe pain. Patient not taking: Reported on 06/23/2022 01/25/22   Lajean Saver, MD  rivaroxaban (XARELTO) 20 MG TABS tablet Take 1 tablet (20 mg total) by mouth daily with supper. 05/04/22   Bensimhon, Shaune Pascal, MD  sacubitril-valsartan (ENTRESTO) 24-26 MG Take 1 tablet by mouth 2 (two) times daily. 04/22/21   Bensimhon, Shaune Pascal, MD  spironolactone (ALDACTONE) 25 MG tablet Take 0.5 tablets (12.5 mg total) by mouth daily. 08/09/22 08/04/23  Rafael Bihari, FNP  TRUEplus Lancets 28G MISC Use to check fasting blood glucose 3 times daily 02/07/22     Turmeric (QC TUMERIC  COMPLEX PO) Take 1 capsule by mouth daily.    [provider]  ELIQUIS 5 MG TABS tablet TAKE 1 TABLET (5 MG TOTAL) BY MOUTH 2 (TWO) TIMES DAILY. 12/24/20 12/27/20  Bensimhon, Shaune Pascal, MD  torsemide (DEMADEX) 20 MG tablet TAKE 2 TABLETS (40 MG TOTAL) BY MOUTH 2 (TWO) TIMES DAILY. 11/24/20 12/27/20  Darrick Grinder D, NP      Allergies    Adhesive [tape]    Review of Systems   Review of Systems  Constitutional:  Negative for chills and fever.  HENT:  Negative for ear pain and sore throat.   Eyes:  Negative for pain and visual disturbance.  Respiratory:  Negative for cough and shortness of breath.   Cardiovascular:  Negative for chest pain and palpitations.  Gastrointestinal:  Negative for abdominal pain and vomiting.  Genitourinary:  Negative for dysuria and hematuria.  Musculoskeletal:  Negative for arthralgias and back pain.  Skin:  Positive for color change. Negative for rash.  Neurological:  Negative for seizures and syncope.  All other systems reviewed and are negative.   Physical Exam Updated Vital Signs BP (!) 202/80 (BP Location: Right Arm)   Pulse (!) 53   Temp 98.5 F (36.9 C) (Oral)   Resp 13   LMP 05/12/2020 (Exact Date)   SpO2 98%  Physical Exam Vitals and nursing note reviewed.  Constitutional:      General: She is not in acute distress.    Appearance: She is well-developed.     Comments: Upon entering the exam room, the patient is lying in bed awake and alert she is in no acute distress  HENT:     Head: Normocephalic and atraumatic.  Eyes:     Conjunctiva/sclera: Conjunctivae normal.  Cardiovascular:     Rate and Rhythm: Normal rate and regular rhythm.     Heart sounds: No murmur heard. Pulmonary:     Effort: Pulmonary effort is normal. No respiratory distress.     Breath sounds: Normal breath sounds.  Abdominal:     Palpations: Abdomen is soft.     Tenderness: There is no abdominal tenderness.  Musculoskeletal:        General: No swelling.     Cervical  back: Neck supple.  Skin:    General: Skin is warm and dry.     Capillary Refill: Capillary refill takes less than 2 seconds.     Comments: Photodocumentation below which reveals the patient's nontender ill-defined rash without any underlying induration or surrounding erythema or warmth.  Distal pulses intact.  Neurological:     Mental Status: She is alert.  Psychiatric:        Mood and Affect: Mood normal.      ED Results / Procedures / Treatments   Labs (all labs ordered are listed, but only abnormal results are displayed) Labs Reviewed  PROTIME-INR - Abnormal; Notable for the following components:      Result Value   Prothrombin Time 16.0 (*)    INR 1.3 (*)    All other components within normal limits  BASIC METABOLIC PANEL - Abnormal; Notable for the following components:   Potassium 3.3 (*)    CO2 21 (*)    Glucose, Bld 183 (*)    BUN 28 (*)    Creatinine, Ser 1.90 (*)    GFR, Estimated 31 (*)    All other components within normal limits  CBC WITH DIFFERENTIAL/PLATELET    EKG None  Radiology No results found.  Procedures Procedures    Medications Ordered in ED Medications - No data to display  ED Course/ Medical Decision Making/ A&P                           Medical Decision Making  Patient presents the emergency department with asymptomatic rash as depicted above.  Do not suspect serious trauma requiring further work-up as the patient does not recall any traumatic injuries nor is there any underlying tenderness.  No underlying fluctuance or surrounding erythema or induration to suggest infectious process.  Skin lesion is approximately 50 to 52 days old per patient.  Does not appear  to be melanoma.  Patient's laboratory work-up is at baseline for her.  Feel the patient is appropriate for discharge at this time.  Patient was instructed to follow-up with the primary care physician moving forward and if the lesion expands in size or become symptomatic she was  informed that she must seek additional assessment        Final Clinical Impression(s) / ED Diagnoses Final diagnoses:  Rash    Rx / DC Orders ED Discharge Orders     None         Levie Heritage, MD 08/15/22 2120    Noemi Chapel, MD 08/15/22 2226

## 2022-08-15 NOTE — ED Provider Triage Note (Signed)
Emergency Medicine Provider Triage Evaluation Note  Yvonda Fouty , a 56 y.o. female  was evaluated in triage.  Pt complains of bruise to her left shin. Has come up in the past few days. No trauma she is aware on. On blood thinners.  Review of Systems  Positive: See above Negative:   Physical Exam  BP (!) 202/80 (BP Location: Right Arm)   Pulse (!) 53   Temp 98.5 F (36.9 C) (Oral)   Resp 13   LMP 05/12/2020 (Exact Date)   SpO2 98%  Gen:   Awake, no distress   Resp:  Normal effort  MSK:   Moves extremities without difficulty  Other:  Discoloration to the left shin   Medical Decision Making  Medically screening exam initiated at 4:34 PM.  Appropriate orders placed.  Anjuli Gemmill was informed that the remainder of the evaluation will be completed by another provider, this initial triage assessment does not replace that evaluation, and the importance of remaining in the ED until their evaluation is complete.     Mickie Hillier, PA-C 08/15/22 1635

## 2022-08-16 ENCOUNTER — Ambulatory Visit (HOSPITAL_COMMUNITY)
Admission: RE | Admit: 2022-08-16 | Discharge: 2022-08-16 | Disposition: A | Discharge status: Home / Self Care | Payer: Medicare Other | Source: Ambulatory Visit | Attending: Cardiology | Admitting: Cardiology

## 2022-08-16 ENCOUNTER — Other Ambulatory Visit: Payer: Self-pay

## 2022-08-16 DIAGNOSIS — I5022 Chronic systolic (congestive) heart failure: Secondary | ICD-10-CM | POA: Diagnosis present

## 2022-08-16 LAB — BASIC METABOLIC PANEL
Anion gap: 8 (ref 5–15)
BUN: 25 mg/dL — ABNORMAL HIGH (ref 6–20)
CO2: 23 mmol/L (ref 22–32)
Calcium: 9 mg/dL (ref 8.9–10.3)
Chloride: 112 mmol/L — ABNORMAL HIGH (ref 98–111)
Creatinine, Ser: 1.83 mg/dL — ABNORMAL HIGH (ref 0.44–1.00)
GFR, Estimated: 32 mL/min — ABNORMAL LOW (ref >60)
Glucose, Bld: 170 mg/dL — ABNORMAL HIGH (ref 70–99)
Potassium: 3.9 mmol/L (ref 3.5–5.1)
Sodium: 143 mmol/L (ref 135–145)

## 2022-08-27 ENCOUNTER — Other Ambulatory Visit (HOSPITAL_COMMUNITY): Payer: Self-pay

## 2022-08-27 ENCOUNTER — Telehealth (HOSPITAL_COMMUNITY): Payer: Self-pay

## 2022-08-27 NOTE — Telephone Encounter (Signed)
Advanced Heart Failure Patient Advocate Encounter  Spoke to patient regarding re-enrollment of Entresto assistance with [employer]. Sent copy of Docusign for patient to review and sign.  Will fax in once all signatures are obtained.   Clista Bernhardt, CPhT Rx Patient Advocate Phone: [phone]

## 2022-09-02 ENCOUNTER — Other Ambulatory Visit (HOSPITAL_COMMUNITY): Payer: Self-pay

## 2022-09-03 ENCOUNTER — Encounter (INDEPENDENT_AMBULATORY_CARE_PROVIDER_SITE_OTHER): Payer: MEDICAID | Admitting: Ophthalmology

## 2022-09-06 ENCOUNTER — Encounter (INDEPENDENT_AMBULATORY_CARE_PROVIDER_SITE_OTHER): Admitting: Ophthalmology

## 2022-09-06 DIAGNOSIS — H35033 Hypertensive retinopathy, bilateral: Secondary | ICD-10-CM

## 2022-09-06 DIAGNOSIS — I10 Essential (primary) hypertension: Secondary | ICD-10-CM

## 2022-09-06 DIAGNOSIS — H4311 Vitreous hemorrhage, right eye: Secondary | ICD-10-CM

## 2022-09-06 DIAGNOSIS — H4312 Vitreous hemorrhage, left eye: Secondary | ICD-10-CM

## 2022-09-06 DIAGNOSIS — H3342 Traction detachment of retina, left eye: Secondary | ICD-10-CM

## 2022-09-06 DIAGNOSIS — H33311 Horseshoe tear of retina without detachment, right eye: Secondary | ICD-10-CM

## 2022-09-06 DIAGNOSIS — Z961 Presence of intraocular lens: Secondary | ICD-10-CM

## 2022-09-06 DIAGNOSIS — E113513 Type 2 diabetes mellitus with proliferative diabetic retinopathy with macular edema, bilateral: Secondary | ICD-10-CM

## 2022-09-06 DIAGNOSIS — H25811 Combined forms of age-related cataract, right eye: Secondary | ICD-10-CM

## 2022-09-11 ENCOUNTER — Other Ambulatory Visit (HOSPITAL_COMMUNITY): Payer: Self-pay

## 2022-09-13 ENCOUNTER — Other Ambulatory Visit (HOSPITAL_COMMUNITY): Payer: Self-pay

## 2022-09-14 ENCOUNTER — Other Ambulatory Visit (HOSPITAL_COMMUNITY): Payer: Self-pay

## 2022-09-17 ENCOUNTER — Other Ambulatory Visit: Payer: Self-pay

## 2022-09-20 ENCOUNTER — Other Ambulatory Visit (HOSPITAL_COMMUNITY): Payer: Self-pay

## 2022-09-20 MED ORDER — ENTRESTO 24-26 MG PO TABS: 1.0000 | ORAL_TABLET | Freq: Two times a day (BID) | ORAL | 5 refills | Status: DC

## 2022-09-22 NOTE — Telephone Encounter (Signed)
Advanced Heart Failure Patient Advocate Encounter  Patient did not complete previous DocuSign and is unable to find the email. Current assistance is scheduled to run out on 10/05/22, but patient has recently received refill. Sent new DocuSign for patient to complete.  Clista Bernhardt, CPhT Rx Patient Advocate Phone: [phone]

## 2022-09-23 ENCOUNTER — Telehealth (HOSPITAL_COMMUNITY): Payer: Self-pay

## 2022-09-23 ENCOUNTER — Other Ambulatory Visit (HOSPITAL_COMMUNITY): Payer: Self-pay

## 2022-09-23 MED ORDER — ENTRESTO 24-26 MG PO TABS: 1.0000 | ORAL_TABLET | Freq: Two times a day (BID) | ORAL | 3 refills | Status: DC

## 2022-09-23 NOTE — Telephone Encounter (Signed)
Advanced Heart Failure Patient Advocate Encounter  Review of patient chart due to changes with HF Fund.  Patient has been taking [name], will apply for Jardiance [employer]) Assistance, and if approved, patient is ok to start [place].  Spoke to patient on phone, sent DocuSign.  Clista Bernhardt, CPhT Rx Patient Advocate Phone: [phone]

## 2022-09-23 NOTE — Telephone Encounter (Addendum)
Advanced Heart Failure Patient Advocate Encounter  Faxed completed application to [employer]. Application scanned to patient chart.  Clista Bernhardt, CPhT Rx Patient Advocate Phone: [phone]

## 2022-09-24 NOTE — Telephone Encounter (Signed)
Advanced Heart Failure Patient Advocate Encounter  Application for Jardiance faxed to [facility] on 09/24/2022. Application form attached to patient chart.  Clista Bernhardt, CPhT Rx Patient Advocate Phone: [phone]

## 2022-09-27 ENCOUNTER — Other Ambulatory Visit (HOSPITAL_COMMUNITY): Payer: Self-pay

## 2022-09-27 MED ORDER — SPIRONOLACTONE 25 MG PO TABS
25.0000 mg | ORAL_TABLET | Freq: Every day | ORAL | 3 refills | Status: DC
  Filled 2022-09-27: qty 30, 30d supply, fill #0

## 2022-09-27 NOTE — Telephone Encounter (Signed)
Advanced Heart Failure Patient Advocate Encounter  Received request from [employer] requesting clarification as [name] is listed on current med list. Left message with [employer] [phone]) confirming this is a therapy change and not a duplicate therapy.  Will update encounter when more information is received.  Clista Bernhardt, CPhT Rx Patient Advocate Phone: [phone]

## 2022-09-27 NOTE — Telephone Encounter (Signed)
Advanced Heart Failure Patient Advocate Encounter  Entresto [employer]) is requesting POI before making a determination. Contacted patient by phone, provided number for [employer]. Patient will call to request Income Attestation Letter.  Clista Bernhardt, CPhT Rx Patient Advocate Phone: [phone]

## 2022-09-28 ENCOUNTER — Other Ambulatory Visit (HOSPITAL_COMMUNITY): Payer: Self-pay | Admitting: *Deleted

## 2022-09-28 MED ORDER — EMPAGLIFLOZIN 10 MG PO TABS: 10.0000 mg | ORAL_TABLET | Freq: Every day | ORAL | 3 refills | Status: DC

## 2022-09-28 NOTE — Telephone Encounter (Signed)
Advanced Heart Failure Patient Advocate Encounter  Jardiance ([employer]) Patient Assistance is APPROVED Effective: 09/24/2022 - 09/24/2023 Determination letter has been added to patient chart. Contacted patient to expect initial delivery call.  Clista Bernhardt, CPhT Rx Patient Advocate Phone: [phone]

## 2022-10-01 ENCOUNTER — Other Ambulatory Visit: Payer: Self-pay

## 2022-10-05 ENCOUNTER — Other Ambulatory Visit: Payer: Self-pay

## 2022-10-05 ENCOUNTER — Other Ambulatory Visit (HOSPITAL_COMMUNITY): Payer: Self-pay

## 2022-10-06 ENCOUNTER — Other Ambulatory Visit (HOSPITAL_COMMUNITY): Payer: Self-pay | Admitting: Internal Medicine

## 2022-10-06 ENCOUNTER — Other Ambulatory Visit (HOSPITAL_COMMUNITY): Payer: Self-pay

## 2022-10-06 ENCOUNTER — Other Ambulatory Visit: Payer: Self-pay | Admitting: Family Medicine

## 2022-10-06 MED ORDER — FREESTYLE TEST VI STRP: ORAL_STRIP | 11 refills | Status: DC

## 2022-10-07 ENCOUNTER — Other Ambulatory Visit (HOSPITAL_COMMUNITY): Payer: Self-pay

## 2022-10-08 ENCOUNTER — Other Ambulatory Visit (HOSPITAL_COMMUNITY): Payer: Self-pay

## 2022-10-11 NOTE — Telephone Encounter (Signed)
Advanced Heart Failure Patient Advocate Encounter  Called for update. Attestation Letter was mailed out on 09/28/22, patient states letter was not received. Advised patient to call back and request a new copy of income letter.  Clista Bernhardt, CPhT Rx Patient Advocate Phone: [phone]

## 2022-10-19 ENCOUNTER — Other Ambulatory Visit (INDEPENDENT_AMBULATORY_CARE_PROVIDER_SITE_OTHER): Payer: Self-pay

## 2022-10-19 ENCOUNTER — Other Ambulatory Visit: Payer: Self-pay

## 2022-10-19 MED ORDER — KETOROLAC TROMETHAMINE 0.5 % OP SOLN
1.0000 [drp] | Freq: Four times a day (QID) | OPHTHALMIC | 3 refills | Status: AC
  Filled 2022-10-19: qty 5, 25d supply, fill #0
  Filled 2023-05-20: qty 5, 25d supply, fill #1
  Filled 2023-09-18: qty 5, 25d supply, fill #2

## 2022-10-19 MED ORDER — PREDNISOLONE ACETATE 1 % OP SUSP
1.0000 [drp] | Freq: Four times a day (QID) | OPHTHALMIC | 0 refills | Status: DC
  Filled 2022-10-19: qty 5, 25d supply, fill #0
  Filled 2023-05-20: qty 5, 25d supply, fill #1

## 2022-10-19 NOTE — Progress Notes (Signed)
Pt requested refills of ketorolac and PF for QID OS. Refilled to [facility]. [place]

## 2022-10-19 NOTE — Telephone Encounter (Signed)
Advanced Heart Failure Patient Surveyor, minerals for update. Patient has been approved to receive Entresto from [place] effective 10/16/22 to 10/16/2023.  Called patient about approval, confirmed patient has contact information for [employer].  Clista Bernhardt, CPhT Rx Patient Advocate Phone: [phone]

## 2022-10-22 ENCOUNTER — Other Ambulatory Visit: Payer: Self-pay

## 2022-10-27 ENCOUNTER — Other Ambulatory Visit (HOSPITAL_COMMUNITY): Payer: Self-pay | Admitting: Internal Medicine

## 2022-10-27 ENCOUNTER — Other Ambulatory Visit (HOSPITAL_COMMUNITY): Payer: Self-pay

## 2022-10-27 MED ORDER — ATORVASTATIN CALCIUM 80 MG PO TABS
80.0000 mg | ORAL_TABLET | Freq: Every day | ORAL | 3 refills | Status: DC
  Filled 2022-10-27: qty 30, 30d supply, fill #0

## 2022-10-28 ENCOUNTER — Other Ambulatory Visit (HOSPITAL_COMMUNITY): Payer: Self-pay

## 2022-10-28 NOTE — Progress Notes (Signed)
[facility] Note  10/29/2022     CHIEF COMPLAINT Patient presents for Retina Follow Up  HISTORY OF PRESENT ILLNESS: Adrienne Lane is a 56 y.o. female who presents to the clinic today for:  HPI     Retina Follow Up   Patient presents with  Diabetic Retinopathy.  In both eyes.  This started months ago.  Duration of 14 weeks.  I, the attending physician,  performed the HPI with the patient and updated documentation appropriately.        Comments   Patient feels that the vision is the same since her last visit 14 weeks ago. She states that she is using the drops as instructed. She has not been checking her sugar and her A1C is 6.6.      Last edited by Bernarda Caffey, MD on 10/29/2022  1:19 PM.    Patient states that the vision is the same.   Referring physician: Jorge Ny, PA-C [place],  [place] [address]  HISTORICAL INFORMATION:   Selected notes from the MEDICAL RECORD NUMBER Referred by Dr. Wyatt Portela for concern of VH   CURRENT MEDICATIONS: Current Outpatient Medications (Ophthalmic Drugs)  Medication Sig   Bromfenac Sodium (PROLENSA) 0.07 % SOLN Place 1 drop into the left eye 4 (four) times daily.   ketorolac (ACULAR) 0.5 % ophthalmic solution Place 1 drop into the left eye 4 (four) times daily.   prednisoLONE acetate (PRED FORTE) 1 % ophthalmic suspension Place 1 drop into the left eye 4 (four) times daily.   No current facility-administered medications for this visit. (Ophthalmic Drugs)   Current Outpatient Medications (Other)  Medication Sig   acetaminophen (TYLENOL) 500 MG tablet Take 500 mg by mouth every 6 (six) hours as needed for moderate pain or headache.   amLODipine (NORVASC) 10 MG tablet Take 1 tablet (10 mg total) by mouth daily. (Patient not taking: Reported on 08/09/2022)   aspirin EC 81 MG tablet Take 81 mg by mouth daily. Swallow whole.   atorvastatin (LIPITOR) 80 MG tablet Take 1 tablet (80 mg total)  by mouth at bedtime.   benzonatate (TESSALON) 100 MG capsule Take 1 capsule (100 mg total) by mouth every 8 (eight) hours. (Patient not taking: Reported on 08/09/2022)   carvedilol (COREG) 6.25 MG tablet Take 1 tablet (6.25 mg total) by mouth 2 (two) times daily with a meal.   cholecalciferol (VITAMIN D3) 25 MCG (1000 UNIT) tablet Take 1,000 Units by mouth daily.   empagliflozin (JARDIANCE) 10 MG TABS tablet Take 1 tablet (10 mg total) by mouth daily before breakfast.   glucose blood (FREESTYLE TEST STRIPS) test strip Use 1 strip 3 (three) times daily   glucose blood (TRUE METRIX BLOOD GLUCOSE TEST) test strip Use to check fasting blood glucose 3 times daily   hydrALAZINE (APRESOLINE) 100 MG tablet Take 1 tablet (100 mg total) by mouth 3 (three) times daily.   insulin aspart (NOVOLOG) 100 UNIT/ML injection Use as directed using sliding scale three times daily. (Patient taking differently: Inject 2-5 Units into the skin 3 (three) times daily with meals. Use as directed using sliding scale three times daily. 2 units to 5 units)   insulin glargine (LANTUS) 100 UNIT/ML injection inject 25 by subcutaneous route as per insulin protocol for 30 days   insulin glargine (LANTUS) 100 UNIT/ML Solostar Pen Inject 22 Units into the skin every morning.   Insulin Pen Needle 31G X 5 MM MISC Inject 1  each into the skin daily.   Insulin Syringe-Needle U-100 (TRUEPLUS INSULIN SYRINGE) 30G X 5/16" 0.3 ML MISC USE AS DIRECTED 3 TIMES DAILY.   Insulin Syringe-Needle U-100 30G X 5/16" 0.5 ML MISC Use as directed.   isosorbide mononitrate (IMDUR) 60 MG 24 hr tablet Take 1 tablet (60 mg total) by mouth daily.   Lancets (ONETOUCH ULTRASOFT) lancets Check CBG twice a day   mexiletine (MEXITIL) 150 MG capsule TAKE 2 CAPSULES (300 MG TOTAL) BY MOUTH EVERY 12 (TWELVE) HOURS.   Multiple Vitamins-Minerals (WOMENS MULTI PO) Take 1 tablet by mouth daily.   Multiple Vitamins-Minerals (ZINC PO) Take 1 capsule by mouth daily.    NIFEdipine (PROCARDIA XL/NIFEDICAL XL) 60 MG 24 hr tablet Take 1 tablet (60 mg total) by mouth daily.   oxyCODONE-acetaminophen (PERCOCET/ROXICET) 5-325 MG tablet Take 1-2 tablets by mouth every 6 (six) hours as needed for severe pain. (Patient not taking: Reported on 06/23/2022)   rivaroxaban (XARELTO) 20 MG TABS tablet Take 1 tablet (20 mg total) by mouth daily with supper.   sacubitril-valsartan (ENTRESTO) 24-26 MG Take 1 tablet by mouth 2 (two) times daily.   spironolactone (ALDACTONE) 25 MG tablet Take 0.5 tablets (12.5 mg total) by mouth daily.   spironolactone (ALDACTONE) 25 MG tablet Take 1 tablet (25 mg total) by mouth daily.   TRUEplus Lancets 28G MISC Use to check fasting blood glucose 3 times daily   Turmeric (QC TUMERIC COMPLEX PO) Take 1 capsule by mouth daily.   No current facility-administered medications for this visit. (Other)   REVIEW OF SYSTEMS: ROS   Positive for: Genitourinary, Endocrine, Cardiovascular, Eyes, Respiratory Negative for: Constitutional, Gastrointestinal, Neurological, Skin, Musculoskeletal, HENT, Psychiatric, Allergic/Imm, Heme/Lymph Last edited by Annie Paras, COT on 10/29/2022  9:04 AM.      ALLERGIES Allergies  Allergen Reactions   Adhesive [Tape]     Tears skin, can tolerate paper tape   PAST MEDICAL HISTORY Past Medical History:  Diagnosis Date   Atrial fibrillation with RVR ([place])    Bigeminy 01/2020   Cataract    CHF (congestive heart failure) ([place]) 10/20/2019   CKD (chronic kidney disease), stage IV (HCC)    Diabetes mellitus without complication ([place])    Diabetic retinopathy ([place])    DOE (dyspnea on exertion)    walking upstairs or up hill resolves in one minute   Dysrhythmia    a-fib - on pradaxa   Fibroids    History of kidney stones    History of recent blood transfusion 02/26/2020   Hypertension    Hypertensive retinopathy    Iron deficiency anemia    Non-ischemic cardiomyopathy ([place])    tachycardia induced    Obese    Peripheral edema    Premature ventricular contractions (PVCs) ([facility])    Sleep apnea    does not use cpap   Stroke ([place]) 37/6283   Umbilical hernia    Wears glasses    Past Surgical History:  Procedure Laterality Date   CATARACT EXTRACTION     CHOLECYSTECTOMY     CYSTOSCOPY W/ URETERAL STENT PLACEMENT Bilateral 05/24/2020   Procedure: CYSTOSCOPY WITH RETROGRADE PYELOGRAM/URETERAL STENT PLACEMENT;  Surgeon: Robley Fries, MD;  Location: [facility];  Service: Urology;  Laterality: Bilateral;   CYSTOSCOPY/RETROGRADE/URETEROSCOPY Bilateral 04/03/2020   Procedure: CYSTOSCOPY/RETROGRADE/URETEROSCOPY;  Surgeon: Ardis Hughs, MD;  [facility]: [facility];  Service: Urology;  Laterality: Bilateral;   EYE SURGERY Left 08/2021   cataract removed   HYSTERECTOMY ABDOMINAL WITH SALPINGO-OOPHORECTOMY  07/21/2020  Procedure: HYSTERECTOMY ABDOMINAL WITH BILATERAL SALPINGO-OOPHORECTOMY;  Surgeon: Sanjuana Kava, MD;  Location: [facility];  Service: Gynecology;;   INJECTION OF SILICONE OIL Left 76/16/0737   Procedure: INJECTION OF SILICONE OIL;  Surgeon: Bernarda Caffey, MD;  Location: [facility];  Service: Ophthalmology;  Laterality: Left;   IR FLUORO GUIDE CV LINE RIGHT  02/21/2020   IR US GUIDE VASC ACCESS RIGHT  02/21/2020   MEMBRANE PEEL Left 10/08/2021   Procedure: MEMBRANE PEEL;  Surgeon: Bernarda Caffey, MD;  Location: [facility];  Service: Ophthalmology;  Laterality: Left;   NEPHROLITHOTOMY Left 02/14/2020   Procedure: NEPHROLITHOTOMY PERCUTANEOUS/ SURGEON ACCESS/ LEFT PERCUTANEOUS NEPHROSTOMY TUBE PLACEMENT;  Surgeon: Ardis Hughs, MD;  [facility];  Service: Urology;  Laterality: Left;   NEPHROLITHOTOMY Left 02/21/2020   Procedure: NEPHROLITHOTOMY PERCUTANEOUS SECOND LOOK;  Surgeon: Ardis Hughs, MD;  [facility];  Service: Urology;  Laterality: Left;   NEPHROLITHOTOMY Left 02/26/2020   Procedure: NEPHROLITHOTOMY PERCUTANEOUS;  Surgeon: Ceasar Mons, MD;  [facility];  Service: Urology;  Laterality: Left;  NEED 150 MIN   NEPHROLITHOTOMY Right 03/24/2020   Procedure: NEPHROLITHOTOMY PERCUTANEOUS WITH ACCESS LEFT STENT REMOVAL;  Surgeon: Ardis Hughs, MD;  [facility];  Service: Urology;  Laterality: Right;   PARS PLANA VITRECTOMY Left 10/08/2021   Procedure: PARS PLANA VITRECTOMY WITH 25 GAUGE;  Surgeon: Bernarda Caffey, MD;  Location: [facility];  Service: Ophthalmology;  Laterality: Left;   PHOTOCOAGULATION WITH LASER Left 10/08/2021   Procedure: PHOTOCOAGULATION WITH LASER;  Surgeon: Bernarda Caffey, MD;  Location: [facility];  Service: Ophthalmology;  Laterality: Left;   RIGHT HEART CATH N/A 11/09/2019   Procedure: RIGHT HEART CATH;  Surgeon: Jolaine Artist, MD;  Location: [facility];  Service: Cardiovascular;  Laterality: N/A;   WISDOM TOOTH EXTRACTION     FAMILY HISTORY Family History  Problem Relation Age of Onset   Atrial fibrillation Mother    Hypertension Mother    Stroke Mother 58   Diabetes Mother    Diabetes Father    Hypertension Father    Diabetes Sister    Hypertension Sister    Diabetes Brother    Hypertension Brother    Diabetes Brother    Heart attack Brother    Stroke Maternal Grandmother 76   SOCIAL HISTORY Social History   Tobacco Use   Smoking status: Never   Smokeless tobacco: Never  Vaping Use   Vaping Use: Never used  Substance Use Topics   Alcohol use: Never   Drug use: Never       OPHTHALMIC EXAM:  Base Eye Exam     Visual Acuity (Snellen - Linear)       Right Left   Dist cc 20/20 CF at 3'   Dist ph cc  NI    Correction: Glasses         Tonometry (Tonopen, 9:08 AM)       Right Left   Pressure 19 17         Pupils       Dark Light Shape React APD   Right 3 2 Round Brisk None   Left 3 2 Round Brisk +1         Visual Fields       Left Right     Full   Restrictions Partial outer superior temporal, inferior temporal, superior nasal, inferior nasal deficiencies           Extraocular Movement  Right Left    Full, Ortho Full, Ortho         Neuro/Psych     Oriented x3: Yes   Mood/Affect: Normal         Dilation     Both eyes: 1.0% Mydriacyl, 2.5% Phenylephrine @ 9:06 AM           Slit Lamp and Fundus Exam     External Exam       Right Left   External Normal Normal         Slit Lamp Exam       Right Left   Lids/Lashes Dermatochalasis - upper lid Dermatochalasis - upper lid   Conjunctiva/Sclera Melanosis sutures dissolved, mild melanosis   Cornea mild arcus, trace PEE mild arcus, well healed cataract wound, 1+ Punctate epithelial erosions   Anterior Chamber Deep and clear Deep and quiet, 1-2+ cell and pigment   Iris Round and dilated, No NVI Round and dilated, No NVI   Lens 1-2+ Nuclear sclerosis, 1-2+ Cortical cataract, fine pigment deposition on posterior capsule PC IOL in good position, pigment deposition on optic, 1-2+PCO   Anterior Vitreous Vitreous syneresis, +RBC, blood stained vitreous condensations - slightly improved and settling inferiorly, turning white post vitrectomy, good oil fill, +pigment         Fundus Exam       Right Left   Disc hazy view - improved, Pink and Sharp, Compact, mild fibrosis nasal, mild PPP temoparly 1+pallor, sharp nasal rim, +temporal edema - improved   C/D Ratio 0.5 0.2   Macula Good foveal reflex, rare MA Flat under oil, fibrosis improved, scattered IRH/DBH, residual fibrosis and thickening nasal macula -- slightly improved   Vessels attenuated, Tortuous, peripheral sclerosis and fibrosis Severe attenuation / sclerosis, +fibrosis improved, tortuosity   Periphery Attached, scattered patches of fibrosis and NVE, small flap tear at 0130 equator--no SRF, good laser surrounding, old VH settled inferiorly - improving; PRP laser changes Attached, Scattered DBH; good PRP changes 360; scattered fibrosis -- improved           Refraction     Wearing Rx       Sphere Cylinder Axis    Right -2.50 +1.75 178   Left -2.50 +1.25 018           IMAGING AND PROCEDURES  Imaging and Procedures for 10/29/2022  OCT, Retina - OU - Both Eyes       Right Eye Quality was good. Central Foveal Thickness: 228. Progression has improved. Findings include normal foveal contour, no IRF, no SRF, intraretinal hyper-reflective material (Interval improvement in vitreous opacities / VH, Persistent fibrosis of nasal disc; macula and fovea stable).   Left Eye Quality was good. Central Foveal Thickness: 349. Progression has improved. Findings include no SRF, abnormal foveal contour, intraretinal fluid, macular pucker, inner retinal atrophy, outer retinal atrophy (Retina attached under oil; diffuse atrophy; mild interval improvment in IRF / edema nasal macula).   Notes *Images captured and stored on drive  Diagnosis / Impression:  OD: interval improvement in vitreous opacities / VH, Persistent fibrosis of nasal disc; macula and fovea stable OS: Retina attached under oil; diffuse atrophy; mild interval increase in IRF / edema nasal macula  Clinical management:  See below  Abbreviations: NFP - Normal foveal profile. CME - cystoid macular edema. PED - pigment epithelial detachment. IRF - intraretinal fluid. SRF - subretinal fluid. EZ - ellipsoid zone. ERM - epiretinal membrane. ORA - outer retinal atrophy. ORT - outer retinal tubulation.  SRHM - subretinal hyper-reflective material. IRHM - intraretinal hyper-reflective material      Intravitreal Injection, Pharmacologic Agent - OD - Right Eye       Time Out 10/29/2022. 9:55 AM. Confirmed correct patient, procedure, site, and patient consented.   Anesthesia Topical anesthesia was used. Anesthetic medications included Lidocaine 2%, Lidocaine 4%, Proparacaine 0.5%.   Procedure Preparation included 5% betadine to ocular surface, eyelid speculum. A supplied (33g) needle was used.   Injection: 1.25 mg Bevacizumab 1.'25mg'$ /0.83m   Route:  Intravitreal, Site: Right Eye   NDC: 5H061816 Lot:: [id number] Expiration date: 12/13/2022   Post-op Post injection exam found visual acuity of at least counting fingers. The patient tolerated the procedure well. There were no complications. The patient received written and verbal post procedure care education. Post injection medications were not given.      Intravitreal Injection, Pharmacologic Agent - OS - Left Eye       Time Out 10/29/2022. 9:56 AM. Confirmed correct patient, procedure, site, and patient consented.   Anesthesia Topical anesthesia was used. Anesthetic medications included Lidocaine 2%, Proparacaine 0.5%.   Procedure Preparation included 5% betadine to ocular surface, eyelid speculum. A (32g) needle was used.   Injection: 1.25 mg Bevacizumab 1.'25mg'$ /0.077m  Route: Intravitreal, Site: Left Eye   NDC: 50H061816Lot: : [id number] date: 11/17/2022   Post-op Post injection exam found visual acuity of at least counting fingers, no retinal detachment. The patient tolerated the procedure well. There were no complications. The patient received written and verbal post procedure care education. Post injection medications were not given.            ASSESSMENT/PLAN:    ICD-10-CM   1. Proliferative diabetic retinopathy of both eyes with macular edema associated with type 2 diabetes mellitus (HCC)  E11.3513 OCT, Retina - OU - Both Eyes    Intravitreal Injection, Pharmacologic Agent - OD - Right Eye    Intravitreal Injection, Pharmacologic Agent - OS - Left Eye    Bevacizumab (AVASTIN) SOLN 1.25 mg    Bevacizumab (AVASTIN) SOLN 1.25 mg    2. Vitreous hemorrhage of right eye ([facility] H43.11     3. Vitreous hemorrhage of left eye (HCC)  H43.12     4. Traction detachment of left retina  H33.42     5. Retinal tear of right eye  H33.311     6. Essential hypertension  I10     7. Hypertensive retinopathy of both eyes  H35.033     8. Combined forms of  age-related cataract of right eye  H25.811     9. Pseudophakia  Z96.1       1-4. Proliferative diabetic retinopathy OU  - delayed f/u -- 10 wks instead of 6  - s/p PRP OD (09.19.22)  - s/p IVA OD #1 (12.23.22), #2 (01.27.23), #3 (02.24.23), #4 (03.24.23), #5 (04.21.23), #6 (05.24.23), #7 (08.04.23)  - s/p IVA OS #1 (01.27.23), #2 (02.24.23), #3 (03.24.23), #4 (04.21.23), #5 (05.24.23), #6 (08.04.23)             - OD: w/ +NVD  - OS: with chronic, diffuse VH and underlying TRD - exam shows +NVD and scattered fibrosis OD,              - s/p PPV/MP/EL/FAX/silicone oil (101914s) OS 10.20.22             - intra-op -- retina beneath chronic, dense VH was severely ischemic, florid fibrosis w/ +360 posterior TRD w/ stretch holes             -  good oil fill, retina attached under oil; fibrosis improved, and good laser changes in place  - BCVA remains CF OS             - IOP 19,17  - OCT shows OD: interval improvement in vitreous opacities / VH, Persistent fibrosis of nasal disc; macula and fovea stable; OS: Retina attached under oil; diffuse atrophy; mild interval increase in IRF / edema nasal macula             - cont PF QID OS    Ketorolac QID OS   - drop instructions reviewed             - Recommend IVA OD #8 and OS #7 today, 11.09.23 for persistent DME            - pt wishes to proceed with injection  - RBA of procedure discussed, questions answered - informed consent obtained and signed - see procedure note  - IVA informed consent obtained and signed, 12.23.22 (OD) - informed consent obtained and signed, 01.27.23 (OS)             - F/u 8 wks w/DFE/OCT/poss. Injection  2.VH OD - New floaters noticed on 12.23.22 exam - likely related to history of PDR and HTN  - s/p IVA OD #1 (12.23.22), #2 (01.27.23), #3 (02.24.23), #4 (03.24.23), #5 (04.21.23), #6 (05.24.23), #7 (08.04.23)  - today, stable improvement in VH and BCVA 20/20 OD - recommend IVA OD #8 today,11.09.23 for PDR as above - IVA  informed consent obtained and signed, 12.23.22 (OD) - see procedure note  - f/u 8 weeks w/DFE/OCT/likely injection  5. Retinal tear, OD - small flap tear located at 0130 equator, no SRF - lasered with PRP OD as above on 09.19.22  6,7. Hypertensive retinopathy OU - discussed importance of tight BP control - monitor    8. Mixed Cataract OD - The symptoms of cataract, surgical options, and treatments and risks were discussed with patient.  - discussed diagnosis and progression - under the expert management of Dr. Wyatt Portela  9. Pseudophakia OS  - s/p CE/IOL (Dr. Katy Fitch)  - IOL in good position, doing well  - monitor   Ophthalmic Meds Ordered this visit:  Meds ordered this encounter  Medications   Bevacizumab (AVASTIN) SOLN 1.25 mg   Bevacizumab (AVASTIN) SOLN 1.25 mg     Return in about 8 weeks (around 12/24/2022) for f/u PDR OU, DFE, OCT, Possible, IVA, OU.  There are no Patient Instructions on file for this visit.  This document serves as a record of services personally performed by Gardiner Sleeper, MD, PhD. It was created on their behalf by Orvan Falconer, an ophthalmic technician. The creation of this record is the provider's dictation and/or activities during the visit.    Electronically signed by: Orvan Falconer, OA, 10/29/22  1:26 PM  This document serves as a record of services personally performed by Gardiner Sleeper, MD, PhD. It was created on their behalf by San Jetty. Owens Shark, OA an ophthalmic technician. The creation of this record is the provider's dictation and/or activities during the visit.    Electronically signed by: San Jetty. Owens Shark, [place] 11.10.2023 1:26 PM  This document serves as a record of services personally performed by Gardiner Sleeper, MD, PhD. It was created on their behalf by Renaldo Reel, [place] an ophthalmic technician. The creation of this record is the provider's dictation and/or activities during the visit.    Electronically signed by:  Renaldo Reel, COT  11.10.23 1:26 PM  Gardiner Sleeper, M.D., Ph.D. Diseases & Surgery of the Retina and Vitreous [facility]  I have reviewed the above documentation for accuracy and completeness, and I agree with the above. Gardiner Sleeper, M.D., Ph.D. 10/29/22 1:27 PM  Abbreviations: M myopia (nearsighted); A astigmatism; H hyperopia (farsighted); P presbyopia; Mrx spectacle prescription;  CTL contact lenses; OD right eye; OS left eye; OU both eyes  XT exotropia; ET esotropia; PEK punctate epithelial keratitis; PEE punctate epithelial erosions; DES dry eye syndrome; MGD meibomian gland dysfunction; ATs artificial tears; PFAT's preservative free artificial tears; [place] nuclear sclerotic cataract; PSC posterior subcapsular cataract; ERM epi-retinal membrane; PVD posterior vitreous detachment; RD retinal detachment; DM diabetes mellitus; DR diabetic retinopathy; NPDR non-proliferative diabetic retinopathy; PDR proliferative diabetic retinopathy; CSME clinically significant macular edema; DME diabetic macular edema; dbh dot blot hemorrhages; CWS cotton wool spot; POAG primary open angle glaucoma; C/D cup-to-disc ratio; HVF humphrey visual field; GVF goldmann visual field; OCT optical coherence tomography; IOP intraocular pressure; BRVO Branch retinal vein occlusion; CRVO central retinal vein occlusion; CRAO central retinal artery occlusion; BRAO branch retinal artery occlusion; RT retinal tear; SB scleral buckle; PPV pars plana vitrectomy; VH Vitreous hemorrhage; PRP panretinal laser photocoagulation; IVK intravitreal kenalog; VMT vitreomacular traction; MH Macular hole;  NVD neovascularization of the disc; NVE neovascularization elsewhere; AREDS age related eye disease study; ARMD age related macular degeneration; POAG primary open angle glaucoma; EBMD epithelial/anterior basement membrane dystrophy; ACIOL anterior chamber intraocular lens; IOL intraocular lens; PCIOL posterior  chamber intraocular lens; Phaco/IOL phacoemulsification with intraocular lens placement; [place] photorefractive keratectomy; LASIK laser assisted in situ keratomileusis; HTN hypertension; DM diabetes mellitus; COPD chronic obstructive pulmonary disease

## 2022-10-29 ENCOUNTER — Ambulatory Visit (INDEPENDENT_AMBULATORY_CARE_PROVIDER_SITE_OTHER): Admitting: Ophthalmology

## 2022-10-29 ENCOUNTER — Encounter (INDEPENDENT_AMBULATORY_CARE_PROVIDER_SITE_OTHER): Payer: Self-pay | Admitting: Ophthalmology

## 2022-10-29 DIAGNOSIS — H4313 Vitreous hemorrhage, bilateral: Secondary | ICD-10-CM

## 2022-10-29 DIAGNOSIS — I10 Essential (primary) hypertension: Secondary | ICD-10-CM | POA: Diagnosis not present

## 2022-10-29 DIAGNOSIS — H4312 Vitreous hemorrhage, left eye: Secondary | ICD-10-CM

## 2022-10-29 DIAGNOSIS — H3342 Traction detachment of retina, left eye: Secondary | ICD-10-CM

## 2022-10-29 DIAGNOSIS — H25811 Combined forms of age-related cataract, right eye: Secondary | ICD-10-CM

## 2022-10-29 DIAGNOSIS — H4311 Vitreous hemorrhage, right eye: Secondary | ICD-10-CM

## 2022-10-29 DIAGNOSIS — H33311 Horseshoe tear of retina without detachment, right eye: Secondary | ICD-10-CM | POA: Diagnosis not present

## 2022-10-29 DIAGNOSIS — E113513 Type 2 diabetes mellitus with proliferative diabetic retinopathy with macular edema, bilateral: Secondary | ICD-10-CM | POA: Diagnosis not present

## 2022-10-29 DIAGNOSIS — H35033 Hypertensive retinopathy, bilateral: Secondary | ICD-10-CM | POA: Diagnosis not present

## 2022-10-29 DIAGNOSIS — Z961 Presence of intraocular lens: Secondary | ICD-10-CM

## 2022-10-29 MED ORDER — BEVACIZUMAB CHEMO INJECTION 1.25MG/0.05ML SYRINGE FOR KALEIDOSCOPE
1.2500 mg | INTRAVITREAL | Status: AC | PRN
  Administered 2022-10-29: 1.25 mg via INTRAVITREAL

## 2022-11-01 ENCOUNTER — Encounter (INDEPENDENT_AMBULATORY_CARE_PROVIDER_SITE_OTHER): Payer: MEDICAID | Admitting: Ophthalmology

## 2022-11-19 ENCOUNTER — Other Ambulatory Visit (HOSPITAL_COMMUNITY): Payer: Self-pay | Admitting: Internal Medicine

## 2022-11-19 ENCOUNTER — Other Ambulatory Visit: Payer: Self-pay

## 2022-11-19 MED ORDER — HYDRALAZINE HCL 100 MG PO TABS
100.0000 mg | ORAL_TABLET | Freq: Three times a day (TID) | ORAL | 3 refills | Status: DC
  Filled 2022-11-19: qty 90, 30d supply, fill #0
  Filled 2023-02-26: qty 90, 30d supply, fill #1
  Filled 2023-05-27 – 2023-06-02 (×2): qty 90, 30d supply, fill #2
  Filled 2023-08-01: qty 90, 30d supply, fill #3

## 2022-11-22 ENCOUNTER — Other Ambulatory Visit: Payer: Self-pay

## 2022-11-23 ENCOUNTER — Telehealth (HOSPITAL_COMMUNITY): Payer: Self-pay | Admitting: Licensed Clinical Social Worker

## 2022-11-23 NOTE — Telephone Encounter (Signed)
H&V Care Navigation CSW Progress Note  Clinical Social Worker reached out to pt to discuss potential insurance options for pt who has been utilizing the HF fund to get medications.  CSW explained new Medicaid expansion to pt which took effect 12/1 and pt reports she is below the required income level so might be eligible.  CSW mailing out flyer to patient explaining new criteria and how to apply.   SDOH Screenings   Food Insecurity: No Food Insecurity (08/09/2022)  Housing: Low Risk  (08/09/2022)  Transportation Needs: No Transportation Needs (08/09/2022)  Depression (PHQ2-9): Low Risk  (03/02/2021)  Tobacco Use: Low Risk  (10/29/2022)    Jorge Ny, [facility] Desk#: [phone] Cell#: [phone]

## 2022-11-26 ENCOUNTER — Other Ambulatory Visit (HOSPITAL_COMMUNITY): Payer: Self-pay

## 2022-11-26 ENCOUNTER — Other Ambulatory Visit: Payer: Self-pay

## 2022-12-07 ENCOUNTER — Other Ambulatory Visit: Payer: Self-pay

## 2022-12-14 ENCOUNTER — Other Ambulatory Visit (HOSPITAL_COMMUNITY): Payer: Self-pay

## 2022-12-24 ENCOUNTER — Encounter (INDEPENDENT_AMBULATORY_CARE_PROVIDER_SITE_OTHER): Payer: MEDICAID | Admitting: Ophthalmology

## 2022-12-30 NOTE — Progress Notes (Shared)
[facility] Note  01/05/2023     CHIEF COMPLAINT Patient presents for No chief complaint on file.  HISTORY OF PRESENT ILLNESS: Adrienne Lane is a 57 y.o. female who presents to the clinic today for:     Referring physician: Jorge Ny, PA-C [place],  [place] [address]  HISTORICAL INFORMATION:   Selected notes from the MEDICAL RECORD NUMBER Referred by Dr. Wyatt Portela for concern of VH   CURRENT MEDICATIONS: Current Outpatient Medications (Ophthalmic Drugs)  Medication Sig   Bromfenac Sodium (PROLENSA) 0.07 % SOLN Place 1 drop into the left eye 4 (four) times daily.   ketorolac (ACULAR) 0.5 % ophthalmic solution Place 1 drop into the left eye 4 (four) times daily.   prednisoLONE acetate (PRED FORTE) 1 % ophthalmic suspension Place 1 drop into the left eye 4 (four) times daily.   No current facility-administered medications for this visit. (Ophthalmic Drugs)   Current Outpatient Medications (Other)  Medication Sig   acetaminophen (TYLENOL) 500 MG tablet Take 500 mg by mouth every 6 (six) hours as needed for moderate pain or headache.   amLODipine (NORVASC) 10 MG tablet Take 1 tablet (10 mg total) by mouth daily. (Patient not taking: Reported on 08/09/2022)   aspirin EC 81 MG tablet Take 81 mg by mouth daily. Swallow whole.   atorvastatin (LIPITOR) 80 MG tablet Take 1 tablet (80 mg total) by mouth at bedtime.   benzonatate (TESSALON) 100 MG capsule Take 1 capsule (100 mg total) by mouth every 8 (eight) hours. (Patient not taking: Reported on 08/09/2022)   carvedilol (COREG) 6.25 MG tablet Take 1 tablet (6.25 mg total) by mouth 2 (two) times daily with a meal.   cholecalciferol (VITAMIN D3) 25 MCG (1000 UNIT) tablet Take 1,000 Units by mouth daily.   empagliflozin (JARDIANCE) 10 MG TABS tablet Take 1 tablet (10 mg total) by mouth daily before breakfast.   glucose blood (FREESTYLE TEST STRIPS) test strip Use 1 strip 3 (three) times  daily   glucose blood (TRUE METRIX BLOOD GLUCOSE TEST) test strip Use to check fasting blood glucose 3 times daily   hydrALAZINE (APRESOLINE) 100 MG tablet Take 1 tablet (100 mg total) by mouth 3 (three) times daily.   insulin aspart (NOVOLOG) 100 UNIT/ML injection Use as directed using sliding scale three times daily. (Patient taking differently: Inject 2-5 Units into the skin 3 (three) times daily with meals. Use as directed using sliding scale three times daily. 2 units to 5 units)   insulin glargine (LANTUS) 100 UNIT/ML injection inject 25 by subcutaneous route as per insulin protocol for 30 days   insulin glargine (LANTUS) 100 UNIT/ML Solostar Pen Inject 22 Units into the skin every morning.   Insulin Pen Needle 31G X 5 MM MISC Inject 1 each into the skin daily.   Insulin Syringe-Needle U-100 30G X 5/16" 0.5 ML MISC Use as directed.   isosorbide mononitrate (IMDUR) 60 MG 24 hr tablet Take 1 tablet (60 mg total) by mouth daily.   Lancets (ONETOUCH ULTRASOFT) lancets Check CBG twice a day   mexiletine (MEXITIL) 150 MG capsule TAKE 2 CAPSULES (300 MG TOTAL) BY MOUTH EVERY 12 (TWELVE) HOURS.   Multiple Vitamins-Minerals (WOMENS MULTI PO) Take 1 tablet by mouth daily.   Multiple Vitamins-Minerals (ZINC PO) Take 1 capsule by mouth daily.   NIFEdipine (PROCARDIA XL/NIFEDICAL XL) 60 MG 24 hr tablet Take 1 tablet (60 mg total) by mouth daily.   oxyCODONE-acetaminophen (PERCOCET/ROXICET)  5-325 MG tablet Take 1-2 tablets by mouth every 6 (six) hours as needed for severe pain. (Patient not taking: Reported on 06/23/2022)   rivaroxaban (XARELTO) 20 MG TABS tablet Take 1 tablet (20 mg total) by mouth daily with supper.   sacubitril-valsartan (ENTRESTO) 24-26 MG Take 1 tablet by mouth 2 (two) times daily.   spironolactone (ALDACTONE) 25 MG tablet Take 0.5 tablets (12.5 mg total) by mouth daily.   spironolactone (ALDACTONE) 25 MG tablet Take 1 tablet (25 mg total) by mouth daily.   TRUEplus Lancets 28G MISC  Use to check fasting blood glucose 3 times daily   Turmeric (QC TUMERIC COMPLEX PO) Take 1 capsule by mouth daily.   No current facility-administered medications for this visit. (Other)   REVIEW OF SYSTEMS:    ALLERGIES Allergies  Allergen Reactions   Adhesive [Tape]     Tears skin, can tolerate paper tape   PAST MEDICAL HISTORY Past Medical History:  Diagnosis Date   Atrial fibrillation with RVR ([place])    Bigeminy 01/2020   Cataract    CHF (congestive heart failure) ([place]) 10/20/2019   CKD (chronic kidney disease), stage IV (HCC)    Diabetes mellitus without complication ([place])    Diabetic retinopathy ([place])    DOE (dyspnea on exertion)    walking upstairs or up hill resolves in one minute   Dysrhythmia    a-fib - on pradaxa   Fibroids    History of kidney stones    History of recent blood transfusion 02/26/2020   Hypertension    Hypertensive retinopathy    Iron deficiency anemia    Non-ischemic cardiomyopathy (HCC)    tachycardia induced   Obese    Peripheral edema    Premature ventricular contractions (PVCs) ([facility])    Sleep apnea    does not use cpap   Stroke ([place]) 61/4431   Umbilical hernia    Wears glasses    Past Surgical History:  Procedure Laterality Date   CATARACT EXTRACTION     CHOLECYSTECTOMY     CYSTOSCOPY W/ URETERAL STENT PLACEMENT Bilateral 05/24/2020   Procedure: CYSTOSCOPY WITH RETROGRADE PYELOGRAM/URETERAL STENT PLACEMENT;  Surgeon: Robley Fries, MD;  Location: [facility];  Service: Urology;  Laterality: Bilateral;   CYSTOSCOPY/RETROGRADE/URETEROSCOPY Bilateral 04/03/2020   Procedure: CYSTOSCOPY/RETROGRADE/URETEROSCOPY;  Surgeon: Ardis Hughs, MD;  Location: [facility];  Service: Urology;  Laterality: Bilateral;   EYE SURGERY Left 08/2021   cataract removed   HYSTERECTOMY ABDOMINAL WITH SALPINGO-OOPHORECTOMY  07/21/2020   Procedure: HYSTERECTOMY ABDOMINAL WITH BILATERAL SALPINGO-OOPHORECTOMY;  Surgeon: Sanjuana Kava, MD;  Location: [facility];   Service: Gynecology;;   INJECTION OF SILICONE OIL Left 54/00/8676   Procedure: INJECTION OF SILICONE OIL;  Surgeon: Bernarda Caffey, MD;  Location: [facility];  Service: Ophthalmology;  Laterality: Left;   IR FLUORO GUIDE CV LINE RIGHT  02/21/2020   IR US GUIDE VASC ACCESS RIGHT  02/21/2020   MEMBRANE PEEL Left 10/08/2021   Procedure: MEMBRANE PEEL;  Surgeon: Bernarda Caffey, MD;  Location: [facility];  Service: Ophthalmology;  Laterality: Left;   NEPHROLITHOTOMY Left 02/14/2020   Procedure: NEPHROLITHOTOMY PERCUTANEOUS/ SURGEON ACCESS/ LEFT PERCUTANEOUS NEPHROSTOMY TUBE PLACEMENT;  Surgeon: Ardis Hughs, MD;  Location: [facility];  Service: Urology;  Laterality: Left;   NEPHROLITHOTOMY Left 02/21/2020   Procedure: NEPHROLITHOTOMY PERCUTANEOUS SECOND LOOK;  Surgeon: Ardis Hughs, MD;  Location: [facility];  Service: Urology;  Laterality: Left;   NEPHROLITHOTOMY Left 02/26/2020   Procedure: NEPHROLITHOTOMY PERCUTANEOUS;  Surgeon: Ceasar Mons, MD;  Location: [facility];  Service: Urology;  Laterality: Left;  NEED 150 MIN   NEPHROLITHOTOMY Right 03/24/2020   Procedure: NEPHROLITHOTOMY PERCUTANEOUS WITH ACCESS LEFT STENT REMOVAL;  Surgeon: Ardis Hughs, MD;  Location: [facility];  Service: Urology;  Laterality: Right;   PARS PLANA VITRECTOMY Left 10/08/2021   Procedure: PARS PLANA VITRECTOMY WITH 25 GAUGE;  Surgeon: Bernarda Caffey, MD;  Location: [place] [facility];  Service: Ophthalmology;  Laterality: Left;   PHOTOCOAGULATION WITH LASER Left 10/08/2021   Procedure: PHOTOCOAGULATION WITH LASER;  Surgeon: Bernarda Caffey, MD;  Location: [place];  Service: Ophthalmology;  Laterality: Left;   RIGHT HEART CATH N/A 11/09/2019   Procedure: RIGHT HEART CATH;  Surgeon: Jolaine Artist, MD;  Location: [facility];  Service: Cardiovascular;  Laterality: N/A;   WISDOM TOOTH EXTRACTION     FAMILY HISTORY Family History  Problem Relation Age of Onset   Atrial fibrillation Mother    Hypertension Mother     Stroke Mother 48   Diabetes Mother    Diabetes Father    Hypertension Father    Diabetes Sister    Hypertension Sister    Diabetes Brother    Hypertension Brother    Diabetes Brother    Heart attack Brother    Stroke Maternal Grandmother 39   SOCIAL HISTORY Social History   Tobacco Use   Smoking status: Never   Smokeless tobacco: Never  Vaping Use   Vaping Use: Never used  Substance Use Topics   Alcohol use: Never   Drug use: Never       OPHTHALMIC EXAM:  Not recorded    IMAGING AND PROCEDURES  Imaging and Procedures for 01/05/2023          ASSESSMENT/PLAN:  No diagnosis found.   1-4. Proliferative diabetic retinopathy OU  - delayed f/u -- 10 wks instead of 6  - s/p PRP OD (09.19.22)  - s/p IVA OD #1 (12.23.22), #2 (01.27.23), #3 (02.24.23), #4 (03.24.23), #5 (04.21.23), #6 (05.24.23), #7 (08.04.23), #8 (11.09.23)  - s/p IVA OS #1 (01.27.23), #2 (02.24.23), #3 (03.24.23), #4 (04.21.23), #5 (05.24.23), #6 (08.04.23) #7 (11.09.23)             - OD: w/ +NVD  - OS: with chronic, diffuse VH and underlying TRD - exam shows +NVD and scattered fibrosis OD,              - s/p PPV/MP/EL/FAX/silicone oil (2694 cs) OS 10.20.22             - intra-op -- retina beneath chronic, dense VH was severely ischemic, florid fibrosis w/ +360 posterior TRD w/ stretch holes             - good oil fill, retina attached under oil; fibrosis improved, and good laser changes in place  - BCVA remains CF OS             - IOP 19,17  - OCT shows OD: interval improvement in vitreous opacities / VH, Persistent fibrosis of nasal disc; macula and fovea stable; OS: Retina attached under oil; diffuse atrophy; mild interval increase in IRF / edema nasal macula             - cont PF QID OS    Ketorolac QID OS   - drop instructions reviewed             - Recommend IVA OD #9 and OS #8 today, 01.17.24 for persistent DME            -  pt wishes to proceed with injection  - RBA of procedure discussed,  questions answered - informed consent obtained and signed - see procedure note  - IVA informed consent obtained and signed, 12.23.22 (OD) - informed consent obtained and signed, 01.27.23 (OS)             - F/u 8 wks w/DFE/OCT/poss. Injection  2.VH OD - New floaters noticed on 12.23.22 exam - likely related to history of PDR and HTN  - s/p IVA OD #1 (12.23.22), #2 (01.27.23), #3 (02.24.23), #4 (03.24.23), #5 (04.21.23), #6 (05.24.23), #7 (08.04.23)  - today, stable improvement in VH and BCVA 20/20 OD - recommend IVA OD #8 today,11.09.23 for PDR as above - IVA informed consent obtained and signed, 12.23.22 (OD) - see procedure note  - f/u 8 weeks w/DFE/OCT/likely injection  5. Retinal tear, OD - small flap tear located at 0130 equator, no SRF - lasered with PRP OD as above on 09.19.22  6,7. Hypertensive retinopathy OU - discussed importance of tight BP control - monitor    8. Mixed Cataract OD - The symptoms of cataract, surgical options, and treatments and risks were discussed with patient.  - discussed diagnosis and progression - under the expert management of Dr. Wyatt Portela  9. Pseudophakia OS  - s/p CE/IOL (Dr. Katy Fitch)  - IOL in good position, doing well  - monitor   Ophthalmic Meds Ordered this visit:  No orders of the defined types were placed in this encounter.    No follow-ups on file.  There are no Patient Instructions on file for this visit.  This document serves as a record of services personally performed by Gardiner Sleeper, MD, PhD. It was created on their behalf by Orvan Falconer, an ophthalmic technician. The creation of this record is the provider's dictation and/or activities during the visit.    Electronically signed by: Orvan Falconer, OA, 12/30/22  2:11 PM   Gardiner Sleeper, M.D., Ph.D. Diseases & Surgery of the Retina and Vitreous [facility]  I have reviewed the above documentation for accuracy and completeness, and I  agree with the above. Gardiner Sleeper, M.D., Ph.D. 10/29/22 2:11 PM  Abbreviations: M myopia (nearsighted); A astigmatism; H hyperopia (farsighted); P presbyopia; Mrx spectacle prescription;  CTL contact lenses; OD right eye; OS left eye; OU both eyes  XT exotropia; ET esotropia; PEK punctate epithelial keratitis; PEE punctate epithelial erosions; DES dry eye syndrome; MGD meibomian gland dysfunction; ATs artificial tears; PFAT's preservative free artificial tears; [place] nuclear sclerotic cataract; PSC posterior subcapsular cataract; ERM epi-retinal membrane; PVD posterior vitreous detachment; RD retinal detachment; DM diabetes mellitus; DR diabetic retinopathy; NPDR non-proliferative diabetic retinopathy; PDR proliferative diabetic retinopathy; CSME clinically significant macular edema; DME diabetic macular edema; dbh dot blot hemorrhages; CWS cotton wool spot; POAG primary open angle glaucoma; C/D cup-to-disc ratio; HVF humphrey visual field; GVF goldmann visual field; OCT optical coherence tomography; IOP intraocular pressure; BRVO Branch retinal vein occlusion; CRVO central retinal vein occlusion; CRAO central retinal artery occlusion; BRAO branch retinal artery occlusion; RT retinal tear; SB scleral buckle; PPV pars plana vitrectomy; VH Vitreous hemorrhage; PRP panretinal laser photocoagulation; IVK intravitreal kenalog; VMT vitreomacular traction; MH Macular hole;  NVD neovascularization of the disc; NVE neovascularization elsewhere; AREDS age related eye disease study; ARMD age related macular degeneration; POAG primary open angle glaucoma; EBMD epithelial/anterior basement membrane dystrophy; ACIOL anterior chamber intraocular lens; IOL intraocular lens; PCIOL posterior chamber intraocular lens; Phaco/IOL phacoemulsification with intraocular lens placement;  [place] photorefractive keratectomy; LASIK laser assisted in situ keratomileusis; HTN hypertension; DM diabetes mellitus; COPD chronic obstructive pulmonary  disease

## 2023-01-05 ENCOUNTER — Encounter (INDEPENDENT_AMBULATORY_CARE_PROVIDER_SITE_OTHER): Payer: MEDICAID | Admitting: Ophthalmology

## 2023-01-17 ENCOUNTER — Other Ambulatory Visit: Payer: Self-pay

## 2023-01-24 ENCOUNTER — Other Ambulatory Visit: Payer: Self-pay

## 2023-01-26 ENCOUNTER — Other Ambulatory Visit: Payer: Self-pay

## 2023-02-01 ENCOUNTER — Other Ambulatory Visit: Payer: Self-pay

## 2023-02-01 ENCOUNTER — Other Ambulatory Visit (HOSPITAL_COMMUNITY): Payer: Self-pay

## 2023-02-02 ENCOUNTER — Other Ambulatory Visit (HOSPITAL_COMMUNITY): Payer: Self-pay

## 2023-02-14 ENCOUNTER — Other Ambulatory Visit (HOSPITAL_COMMUNITY): Payer: Self-pay

## 2023-02-15 ENCOUNTER — Other Ambulatory Visit (HOSPITAL_COMMUNITY): Payer: Self-pay

## 2023-02-15 ENCOUNTER — Other Ambulatory Visit: Payer: Self-pay

## 2023-02-28 ENCOUNTER — Other Ambulatory Visit: Payer: Self-pay

## 2023-04-08 ENCOUNTER — Other Ambulatory Visit (HOSPITAL_COMMUNITY): Payer: Self-pay

## 2023-04-08 MED ORDER — INSULIN ASPART 100 UNIT/ML IJ SOLN
INTRAMUSCULAR | 1 refills | Status: DC
  Filled 2023-04-08: qty 10, 30d supply, fill #0

## 2023-04-08 MED ORDER — BASAGLAR KWIKPEN 100 UNIT/ML ~~LOC~~ SOPN
22.0000 [IU] | PEN_INJECTOR | Freq: Every morning | SUBCUTANEOUS | 1 refills | Status: DC
  Filled 2023-04-08: qty 6, 27d supply, fill #0
  Filled 2023-06-07: qty 6, 27d supply, fill #1

## 2023-04-08 MED ORDER — INSULIN ASPART 100 UNIT/ML IJ SOLN
2.0000 [IU] | Freq: Three times a day (TID) | INTRAMUSCULAR | 1 refills | Status: DC
  Filled 2023-04-08: qty 10, 28d supply, fill #0

## 2023-04-08 MED ORDER — JARDIANCE 10 MG PO TABS
10.0000 mg | ORAL_TABLET | Freq: Every day | ORAL | 1 refills | Status: DC
  Filled 2023-04-08: qty 90, 90d supply, fill #0
  Filled 2023-08-01: qty 90, 90d supply, fill #1

## 2023-04-08 MED ORDER — NIFEDIPINE ER OSMOTIC RELEASE 60 MG PO TB24
60.0000 mg | ORAL_TABLET | Freq: Every day | ORAL | 1 refills | Status: DC
  Filled 2023-04-08: qty 90, 90d supply, fill #0

## 2023-04-09 ENCOUNTER — Other Ambulatory Visit: Payer: Self-pay | Admitting: Physician Assistant

## 2023-04-09 DIAGNOSIS — Z1231 Encounter for screening mammogram for malignant neoplasm of breast: Secondary | ICD-10-CM

## 2023-04-11 ENCOUNTER — Other Ambulatory Visit (HOSPITAL_COMMUNITY): Payer: Self-pay

## 2023-04-11 MED ORDER — INSULIN ASPART 100 UNIT/ML IJ SOLN
2.0000 [IU] | Freq: Three times a day (TID) | INTRAMUSCULAR | 1 refills | Status: DC
  Filled 2023-04-11: qty 10, 67d supply, fill #0

## 2023-04-11 MED ORDER — ATORVASTATIN CALCIUM 80 MG PO TABS
80.0000 mg | ORAL_TABLET | Freq: Every day | ORAL | 2 refills | Status: DC
  Filled 2023-04-11: qty 90, 90d supply, fill #0
  Filled 2023-08-01: qty 90, 90d supply, fill #1
  Filled 2023-11-22: qty 90, 90d supply, fill #2

## 2023-04-15 ENCOUNTER — Ambulatory Visit
Admission: RE | Admit: 2023-04-15 | Discharge: 2023-04-15 | Disposition: A | Discharge status: Home / Self Care | Payer: Medicare Other | Source: Ambulatory Visit | Attending: Physician Assistant | Admitting: Physician Assistant

## 2023-04-15 DIAGNOSIS — Z1231 Encounter for screening mammogram for malignant neoplasm of breast: Secondary | ICD-10-CM

## 2023-04-19 ENCOUNTER — Other Ambulatory Visit: Payer: Self-pay | Admitting: Physician Assistant

## 2023-04-19 DIAGNOSIS — R928 Other abnormal and inconclusive findings on diagnostic imaging of breast: Secondary | ICD-10-CM

## 2023-04-22 ENCOUNTER — Ambulatory Visit (INDEPENDENT_AMBULATORY_CARE_PROVIDER_SITE_OTHER): Payer: Medicare Other | Admitting: Podiatry

## 2023-04-22 ENCOUNTER — Encounter: Payer: Self-pay | Admitting: Podiatry

## 2023-04-22 DIAGNOSIS — E1165 Type 2 diabetes mellitus with hyperglycemia: Secondary | ICD-10-CM | POA: Diagnosis not present

## 2023-04-22 DIAGNOSIS — Z794 Long term (current) use of insulin: Secondary | ICD-10-CM

## 2023-04-22 NOTE — Addendum Note (Signed)
Addended by: Helane Gunther on: 04/22/2023 10:31 AM   Modules accepted: Level of Service

## 2023-04-22 NOTE — Progress Notes (Addendum)
This patient presents to the office for diabetic foot exam   This patient says there is no pain or discomfort in her feet.  No history of infection or drainage.  This patient presents to the office for foot exam due to having a history of diabetes.  She has history 84f CVA and CKD.  Vascular  Dorsalis pedis and posterior tibial pulses are palpable  B/L.  Capillary return  WNL.  Temperature gradient is  WNL.  Skin turgor  WNL  Sensorium  Senn Weinstein monofilament wire  WNL. Normal tactile sensation.  Nail Exam  Patient has normal nails with no evidence of bacterial or fungal infection.  Orthopedic  Exam  Muscle tone and muscle strength  WNL.  No limitations of motion feet  B/L.  No crepitus or joint effusion noted.  Foot type is unremarkable and digits show no abnormalities. HAV  B/L.  Pes planus  B/L.  Skin  No open lesions.  Normal skin texture and turgor.   Diabetes with no complications  Diabetic foot exam was performed.  There is no evidence of vascular or neurologic pathology.  RTC  6 weeks for nail care.   Helane Gunther DPM

## 2023-05-02 ENCOUNTER — Other Ambulatory Visit (HOSPITAL_COMMUNITY): Payer: Self-pay

## 2023-05-03 ENCOUNTER — Other Ambulatory Visit (HOSPITAL_COMMUNITY): Payer: Self-pay

## 2023-05-05 ENCOUNTER — Other Ambulatory Visit: Payer: Self-pay

## 2023-05-05 ENCOUNTER — Other Ambulatory Visit (HOSPITAL_COMMUNITY): Payer: Self-pay | Admitting: Internal Medicine

## 2023-05-05 ENCOUNTER — Other Ambulatory Visit (HOSPITAL_COMMUNITY): Payer: Self-pay

## 2023-05-05 MED ORDER — RIVAROXABAN 20 MG PO TABS
20.0000 mg | ORAL_TABLET | Freq: Every day | ORAL | 11 refills | Status: DC
  Filled 2023-05-05: qty 30, 30d supply, fill #0
  Filled 2023-07-04: qty 30, 30d supply, fill #1
  Filled 2023-08-01: qty 30, 30d supply, fill #2
  Filled 2023-11-02: qty 30, 30d supply, fill #3
  Filled 2023-12-19: qty 30, 30d supply, fill #4
  Filled 2024-03-16: qty 90, 90d supply, fill #5

## 2023-05-06 ENCOUNTER — Ambulatory Visit (HOSPITAL_COMMUNITY)
Admission: RE | Admit: 2023-05-06 | Discharge: 2023-05-06 | Disposition: A | Discharge status: Home / Self Care | Payer: Medicare Other | Source: Ambulatory Visit | Attending: Physician Assistant | Admitting: Physician Assistant

## 2023-05-06 ENCOUNTER — Encounter (HOSPITAL_COMMUNITY): Payer: Self-pay

## 2023-05-06 ENCOUNTER — Ambulatory Visit (HOSPITAL_BASED_OUTPATIENT_CLINIC_OR_DEPARTMENT_OTHER)
Admission: RE | Admit: 2023-05-06 | Discharge: 2023-05-06 | Disposition: A | Discharge status: Home / Self Care | Payer: Medicare Other | Source: Ambulatory Visit | Attending: Physician Assistant | Admitting: Physician Assistant

## 2023-05-06 VITALS — BP 144/90 | HR 68 | Wt 247.0 lb

## 2023-05-06 DIAGNOSIS — I493 Ventricular premature depolarization: Secondary | ICD-10-CM | POA: Insufficient documentation

## 2023-05-06 DIAGNOSIS — I480 Paroxysmal atrial fibrillation: Secondary | ICD-10-CM

## 2023-05-06 DIAGNOSIS — I130 Hypertensive heart and chronic kidney disease with heart failure and stage 1 through stage 4 chronic kidney disease, or unspecified chronic kidney disease: Secondary | ICD-10-CM | POA: Diagnosis not present

## 2023-05-06 DIAGNOSIS — G4730 Sleep apnea, unspecified: Secondary | ICD-10-CM | POA: Diagnosis not present

## 2023-05-06 DIAGNOSIS — Z7901 Long term (current) use of anticoagulants: Secondary | ICD-10-CM | POA: Insufficient documentation

## 2023-05-06 DIAGNOSIS — N1832 Chronic kidney disease, stage 3b: Secondary | ICD-10-CM

## 2023-05-06 DIAGNOSIS — I5022 Chronic systolic (congestive) heart failure: Secondary | ICD-10-CM

## 2023-05-06 DIAGNOSIS — I4891 Unspecified atrial fibrillation: Secondary | ICD-10-CM | POA: Diagnosis not present

## 2023-05-06 DIAGNOSIS — N184 Chronic kidney disease, stage 4 (severe): Secondary | ICD-10-CM | POA: Insufficient documentation

## 2023-05-06 DIAGNOSIS — R0683 Snoring: Secondary | ICD-10-CM

## 2023-05-06 DIAGNOSIS — Z8673 Personal history of transient ischemic attack (TIA), and cerebral infarction without residual deficits: Secondary | ICD-10-CM | POA: Diagnosis present

## 2023-05-06 DIAGNOSIS — I10 Essential (primary) hypertension: Secondary | ICD-10-CM

## 2023-05-06 LAB — BASIC METABOLIC PANEL
Anion gap: 8 (ref 5–15)
BUN: 41 mg/dL — ABNORMAL HIGH (ref 6–20)
CO2: 23 mmol/L (ref 22–32)
Calcium: 9.1 mg/dL (ref 8.9–10.3)
Chloride: 111 mmol/L (ref 98–111)
Creatinine, Ser: 2.14 mg/dL — ABNORMAL HIGH (ref 0.44–1.00)
GFR, Estimated: 27 mL/min — ABNORMAL LOW (ref >60)
Glucose, Bld: 149 mg/dL — ABNORMAL HIGH (ref 70–99)
Potassium: 4.6 mmol/L (ref 3.5–5.1)
Sodium: 142 mmol/L (ref 135–145)

## 2023-05-06 LAB — ECHOCARDIOGRAM COMPLETE
AR max vel: 1.86 cm2
AV Area VTI: 1.7 cm2
AV Area mean vel: 1.91 cm2
AV Mean grad: 3.5 mmHg
AV Peak grad: 7.2 mmHg
Ao pk vel: 1.34 m/s
Area-P 1/2: 3.34 cm2
Calc EF: 56.3 %
S' Lateral: 3.3 cm
Single Plane A2C EF: 56.6 %
Single Plane A4C EF: 56.5 %

## 2023-05-06 LAB — BRAIN NATRIURETIC PEPTIDE: B Natriuretic Peptide: 84.4 pg/mL (ref 0.0–100.0)

## 2023-05-06 NOTE — Patient Instructions (Addendum)
Thank you for coming in today  EKG was done today  If you had labs drawn today, any labs that are abnormal the clinic will call you No news is good news  You have been referred to  [facility] for sleep study , their office will call you for further appointment details    Medications: No changes    Follow up appointments:  Your physician recommends that you schedule a follow-up appointment in:  4 months in clinic    Do the following things EVERYDAY: Weigh yourself in the morning before breakfast. Write it down and keep it in a log. Take your medicines as prescribed Eat low salt foods--Limit salt (sodium) to 2000 mg per day.  Stay as active as you can everyday Limit all fluids for the day to less than 2 liters   At the [facility], you and your health needs are our priority. As part of our continuing mission to provide you with exceptional heart care, we have created designated Provider Care Teams. These Care Teams include your primary Cardiologist (physician) and Advanced Practice Providers (APPs- Physician Assistants and Nurse Practitioners) who all work together to provide you with the care you need, when you need it.   You may see any of the following providers on your designated Care Team at your next follow up: Dr Arvilla Meres Dr Marca Ancona Dr. Marcos Eke, NP Robbie Lis, [place] [facility] [place], [place] Brynda Peon, NP Karle Plumber, PharmD   Please be sure to bring in all your medications bottles to every appointment.    Thank you for choosing [facility]  If you have any questions or concerns before your next appointment please send [place] a message through [place] or call our office at [phone].    TO LEAVE A MESSAGE FOR THE NURSE SELECT OPTION 2, PLEASE LEAVE A MESSAGE INCLUDING: YOUR NAME DATE OF BIRTH CALL BACK NUMBER REASON FOR CALL**this is important  as we prioritize the call backs  YOU WILL RECEIVE A CALL BACK THE SAME DAY AS LONG AS YOU CALL BEFORE 4:00 PM

## 2023-05-06 NOTE — Progress Notes (Addendum)
Advanced Heart Failure Clinic Note PCP: Dr. Laural Benes  Primary Cardiologist: Dr. Bjorn Pippin HF MD: Dr. Gala Romney Nephorology: Dr. Marisue Humble   HPI: Adrienne Lane is a 57 y.o. woman with morbid obesity, HTN, DM2, PAF, PVCs, CKD3b and chronic systolic HF.   Admitted to [facility] 10/20 after a mechanical fall. ECHO EF 20-25% with severe RV dysfunction . 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 Renal function worsened. CT showed bilateral staghorn calculi without hydronephrosis and severe anasarca. Also found to have uterine mass felt to be large fibroid. Diuresed 90 pounds. Placed on carvedilol and mexiletine due to high PVC burden.   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 Saw Dr. Ladona Ridgel who was considering PVC ablation. Doubted mexilitene is helping much.  Admitted 6/21 with AKI on CKD. CT scan of the abdomen which showed bilateral hydronephrosis and large fibroid uterus.  Creatinine on presentation was 3.24. Bilateral ureteral stent placement with bilateral retrograde pyelography. Creatinine improved.   07/2020 underwent total abdominal hysterectomy and bilateral salpingoopherectomy  Admitted 1/22 for acute CVA. Presented w/ facial droop and blurry vision. She presented outside of window for TPA.  Seen by neurology/stroke team.  MRI brain showed acute infarct in the right hemipons, additional small acute infarct in the left frontal subcortical white matter.  Mild associated edema in the right pons without substantial mass-effect. Echo EF 45-50%. Eliquis was switched to Pradaxa. She was discharged to CIR. While in CIR she developed upper respiratory symptoms and tested + for COVID and was readmitted to [facility] under TRH.   Zio 04/22 showed frequent PVCs but EF stable and asymptomatic. Continued on mexiltiene.    She is here for overdue follow-up and echo.  She has been doing well from HF perspective. Weight up 6 lb from last visit but does not feel it is fluid.  No dyspnea, orthopnea, PND or lower extremity edema. Denies CP, palpitations and dizziness. For the most part has been taking all medications as prescribed. Ran out of Entresto a few days ago and restarted Rx this am, has been out of Coreg a few days and going to pick up today. Missed several doses of Xarelto.   Recently had mammogram which was abnormal and has to go for additional testing.  Cardiac Studies:  - Zio (4/22): 740 runs SVT, PVCs 21%. PACs 5.6%  - Echo (1/22): EF 45-50%. RV mildly reduced  - Echo (2/21); EF 40-45% hard to assess with PVCs. (read formally as 50-55%) RV mildly down No effusion.   - Echo (11/20: EF 20-25%, pericardial effusion, mild LVH, RA/LA dilated.  RV down.   - RHC 11/12/19  RA = 11 RV = 47/15 PA = 49/17 (32) PCW = 20 Fick cardiac output/index = 8.8/3.8 PVR = 1.4 WU FA sat = 97% PA sat = 70%, 72% High SVC = 68%  ROS: All systems negative except as listed in HPI, PMH and Problem List.  SH:  Social History   Socioeconomic History   Marital status: Married    Spouse name: Not on file   Number of children: 0   Years of education: Not on file   Highest education level: Associate degree: occupational, Scientist, product/process development, or vocational program  Occupational History   Occupation: unemployed  Tobacco Use   Smoking status: Never   Smokeless tobacco: Never  Vaping Use   Vaping Use: Never used  Substance and Sexual Activity   Alcohol use: Never   Drug use: Never   Sexual activity: Yes  Birth control/protection: Surgical    Comment: Hysterectomy  Other Topics Concern   Not on file  Social History Narrative   Not on file   Social Determinants of Health   Financial Resource Strain: Not on file  Food Insecurity: No Food Insecurity (08/09/2022)   Hunger Vital Sign    Worried About Running Out of Food in the Last Year: Never true    Ran Out of Food in the Last Year: Never true  Transportation Needs: No Transportation Needs (08/09/2022)   PRAPARE -  Administrator, Civil Service (Medical): No    Lack of Transportation (Non-Medical): No  Physical Activity: Not on file  Stress: Not on file  Social Connections: Not on file  Intimate Partner Violence: Not on file    FH:  Family History  Problem Relation Age of Onset   Atrial fibrillation Mother    Hypertension Mother    Stroke Mother 8   Diabetes Mother    Diabetes Father    Hypertension Father    Diabetes Sister    Hypertension Sister    Diabetes Brother    Hypertension Brother    Diabetes Brother    Heart attack Brother    Stroke Maternal Grandmother 75    Past Medical History:  Diagnosis Date   Atrial fibrillation with RVR (HCC)    Bigeminy 01/2020   Cataract    CHF (congestive heart failure) (HCC) 10/20/2019   CKD (chronic kidney disease), stage IV (HCC)    Diabetes mellitus without complication (HCC)    Diabetic retinopathy (HCC)    DOE (dyspnea on exertion)    walking upstairs or up hill resolves in one minute   Dysrhythmia    a-fib - on pradaxa   Fibroids    History of kidney stones    History of recent blood transfusion 02/26/2020   Hypertension    Hypertensive retinopathy    Iron deficiency anemia    Non-ischemic cardiomyopathy (HCC)    tachycardia induced   Obese    Peripheral edema    Premature ventricular contractions (PVCs) (VPCs)    Sleep apnea    does not use cpap   Stroke (HCC) 12/2020   Umbilical hernia    Wears glasses     Current Outpatient Medications  Medication Sig Dispense Refill   acetaminophen (TYLENOL) 500 MG tablet Take 500 mg by mouth every 6 (six) hours as needed for moderate pain or headache.     amLODipine (NORVASC) 10 MG tablet Take 1 tablet (10 mg total) by mouth daily. (Patient not taking: Reported on 08/09/2022) 30 tablet 6   aspirin EC 81 MG tablet Take 81 mg by mouth daily. Swallow whole.     atorvastatin (LIPITOR) 80 MG tablet Take 1 tablet (80 mg total) by mouth at bedtime. 30 tablet 3   atorvastatin  (LIPITOR) 80 MG tablet Take 1 tablet (80 mg total) by mouth at bedtime. 90 tablet 2   benzonatate (TESSALON) 100 MG capsule Take 1 capsule (100 mg total) by mouth every 8 (eight) hours. (Patient not taking: Reported on 08/09/2022) 21 capsule 0   Bromfenac Sodium (PROLENSA) 0.07 % SOLN Place 1 drop into the left eye 4 (four) times daily. 3 mL 3   carvedilol (COREG) 6.25 MG tablet Take 1 tablet (6.25 mg total) by mouth 2 (two) times daily with a meal. 180 tablet 3   cholecalciferol (VITAMIN D3) 25 MCG (1000 UNIT) tablet Take 1,000 Units by mouth daily.  empagliflozin (JARDIANCE) 10 MG TABS tablet Take 1 tablet (10 mg total) by mouth daily before breakfast. 90 tablet 3   empagliflozin (JARDIANCE) 10 MG TABS tablet Take 1 Tablet by mouth daily. 90 tablet 1   glucose blood (FREESTYLE TEST STRIPS) test strip Use 1 strip 3 (three) times daily 100 each 11   glucose blood (TRUE METRIX BLOOD GLUCOSE TEST) test strip Use to check fasting blood glucose 3 times daily 50 strip 6   hydrALAZINE (APRESOLINE) 100 MG tablet Take 1 tablet (100 mg total) by mouth 3 (three) times daily. 90 tablet 3   insulin aspart (NOVOLOG) 100 UNIT/ML injection Use as directed using sliding scale three times daily. (Patient taking differently: Inject 2-5 Units into the skin 3 (three) times daily with meals. Use as directed using sliding scale three times daily. 2 units to 5 units) 10 mL PRN   insulin aspart (NOVOLOG) 100 UNIT/ML injection Inject 2-5 Units into the skin 3 (three) times daily before meals per sliding scale.  Not to exceed 20 units a day 10 mL 1   Insulin Glargine (BASAGLAR KWIKPEN) 100 UNIT/ML Inject 22 Units into the skin in the morning. 18 mL 1   insulin glargine (LANTUS) 100 UNIT/ML injection inject 25 by subcutaneous route as per insulin protocol for 30 days 10 mL 6   insulin glargine (LANTUS) 100 UNIT/ML Solostar Pen Inject 22 Units into the skin every morning. 21 mL 3   Insulin Pen Needle 31G X 5 MM MISC Inject 1  each into the skin daily. 100 each 3   Insulin Syringe-Needle U-100 30G X 5/16" 0.5 ML MISC Use as directed. 100 each 11   isosorbide mononitrate (IMDUR) 60 MG 24 hr tablet Take 1 tablet (60 mg total) by mouth daily. 30 tablet 3   ketorolac (ACULAR) 0.5 % ophthalmic solution Place 1 drop into the left eye 4 (four) times daily. 10 mL 3   Lancets (ONETOUCH ULTRASOFT) lancets Check CBG twice a day 60 each 5   mexiletine (MEXITIL) 150 MG capsule TAKE 2 CAPSULES (300 MG TOTAL) BY MOUTH EVERY 12 (TWELVE) HOURS. 120 capsule 6   Multiple Vitamins-Minerals (WOMENS MULTI PO) Take 1 tablet by mouth daily.     Multiple Vitamins-Minerals (ZINC PO) Take 1 capsule by mouth daily.     NIFEdipine (PROCARDIA XL/NIFEDICAL XL) 60 MG 24 hr tablet Take 1 tablet (60 mg total) by mouth daily. 90 tablet 3   NIFEdipine (PROCARDIA XL/NIFEDICAL XL) 60 MG 24 hr tablet Take 1 tablet (60 mg total) by mouth daily. 90 tablet 1   oxyCODONE-acetaminophen (PERCOCET/ROXICET) 5-325 MG tablet Take 1-2 tablets by mouth every 6 (six) hours as needed for severe pain. (Patient not taking: Reported on 06/23/2022) 15 tablet 0   prednisoLONE acetate (PRED FORTE) 1 % ophthalmic suspension Place 1 drop into the left eye 4 (four) times daily. 10 mL 0   rivaroxaban (XARELTO) 20 MG TABS tablet Take 1 tablet (20 mg total) by mouth daily with supper. 30 tablet 11   sacubitril-valsartan (ENTRESTO) 24-26 MG Take 1 tablet by mouth 2 (two) times daily. 180 tablet 3   spironolactone (ALDACTONE) 25 MG tablet Take 0.5 tablets (12.5 mg total) by mouth daily. 90 tablet 0   spironolactone (ALDACTONE) 25 MG tablet Take 1 tablet (25 mg total) by mouth daily. 30 tablet 3   TRUEplus Lancets 28G MISC Use to check fasting blood glucose 3 times daily 100 each 3   Turmeric (QC TUMERIC COMPLEX PO) Take 1 capsule  by mouth daily.     No current facility-administered medications for this visit.   LMP 05/12/2020 (Exact Date)   Wt Readings from Last 3 Encounters:   08/09/22 109.3 kg (241 lb)  07/14/22 106.6 kg (235 lb)  06/23/22 108.9 kg (240 lb)   PHYSICAL EXAM: General:  NAD. No resp difficulty HEENT: Normal Neck: Supple. No JVD. Carotids 2+ bilat; no bruits. No lymphadenopathy or thryomegaly appreciated. Cor: PMI nondisplaced. Regular rate & rhythm with ectopy. No rubs, gallops or murmurs. Lungs: Clear Abdomen: Obese, soft, nontender, nondistended.  Extremities: No cyanosis, clubbing, rash, edema Neuro: Alert & oriented x 3. Moves all 4 extremities w/o difficulty. Affect pleasant.  ECG (personally reviewed): SR with PVCs 76 bpm  ASSESSMENT & PLAN: 1. Chronic Systolic Heart Failure - Echo (11/20):  EF 20-25%, pericardial effusion, mild LVH, RA/LA dilated.  RV . Possible Tachy Mediated cardiomyopathy verus PVC. She has not had LHC due to elevated creatinine.  - Had RHC (11/20) with preserved cardiac output.  - Echo (2/21): EF 40-45% (Hard to assess with PVCs) RV mild HK. No effusion - Echo (1/22): EF 45-50%. RV mildly reduced  - Stable NYHA II, not requiring loop diuretic - Continue spiro 25 daily - Continue Entresto 24/26 mg bid. - Continue coreg 6.25 mg BID - Continue Jardiance 10 mg daily  - Continue hydralazine 100 mg tid + imdur 60 mg daily.  - BP elevated but has missed several doses of medications. Just restarted Entresto and out of Coreg several days (states she is picking up from pharmacy) - Awaiting repeat echo - Labs today  2. Frequent PVCs  - Failed amio - On mexilitene 300 mg bid. - Zio Patch 11/2019 Frequent PVCs (19.0%, 42278) with two predominant morphologies (9.8 & 9.3%, respectively).  - Zio 4/22 740 runs SVT, PVCs 21%. PACs 5.6% - Previously followed by Dr. Ladona Ridgel.    3. PAF  - Maintaining NSR w/ PVCs on ECG  - Continue carvedilol 6.25 mg bid.  - Had CVA w/ Eliquis, now on Xarelto. No bleeding issues. Stressed importance of adherence with medication.   4. CKD 3b - Baseline SCr 1.8-2 - Follows with Nephrology.  Will request records from visit. - Continue SGLT2i.  - labs today.  5. HTN  - BP elevated but no changes as above.  - Needs to restart meds. - GDMT as above.    6. Type 2 DM - Continue Jardiance   7. Uterine Fibroids  - s/p total abdominal hysterectomy and bilateral salpingoopherectomy  8. Possible OSA with snoring - Refer for sleep study, now has medicaid  9. CVA - CVA 1/21 in setting of Afib - Failed Eliquis, now on Xarelto. - Followed by Dr. Pearlean Brownie.   Follow up 4 months  Tamirah George N PA-C  7:16 AM

## 2023-05-13 ENCOUNTER — Ambulatory Visit
Admission: RE | Admit: 2023-05-13 | Discharge: 2023-05-13 | Disposition: A | Discharge status: Home / Self Care | Payer: Medicare Other | Source: Ambulatory Visit | Attending: Physician Assistant | Admitting: Physician Assistant

## 2023-05-13 DIAGNOSIS — R928 Other abnormal and inconclusive findings on diagnostic imaging of breast: Secondary | ICD-10-CM

## 2023-05-20 ENCOUNTER — Other Ambulatory Visit: Payer: Self-pay

## 2023-06-02 ENCOUNTER — Other Ambulatory Visit: Payer: Self-pay

## 2023-06-02 ENCOUNTER — Other Ambulatory Visit (HOSPITAL_COMMUNITY): Payer: Self-pay

## 2023-06-06 ENCOUNTER — Ambulatory Visit (INDEPENDENT_AMBULATORY_CARE_PROVIDER_SITE_OTHER): Payer: Medicare Other | Admitting: Podiatry

## 2023-06-06 ENCOUNTER — Ambulatory Visit (HOSPITAL_BASED_OUTPATIENT_CLINIC_OR_DEPARTMENT_OTHER): Payer: Medicare Other | Attending: Physician Assistant | Admitting: Cardiology

## 2023-06-06 ENCOUNTER — Encounter: Payer: Self-pay | Admitting: Podiatry

## 2023-06-06 VITALS — Ht 68.0 in | Wt 240.0 lb

## 2023-06-06 DIAGNOSIS — B351 Tinea unguium: Secondary | ICD-10-CM

## 2023-06-06 DIAGNOSIS — I493 Ventricular premature depolarization: Secondary | ICD-10-CM | POA: Diagnosis not present

## 2023-06-06 DIAGNOSIS — E1165 Type 2 diabetes mellitus with hyperglycemia: Secondary | ICD-10-CM

## 2023-06-06 DIAGNOSIS — I110 Hypertensive heart disease with heart failure: Secondary | ICD-10-CM | POA: Insufficient documentation

## 2023-06-06 DIAGNOSIS — Z794 Long term (current) use of insulin: Secondary | ICD-10-CM | POA: Diagnosis not present

## 2023-06-06 DIAGNOSIS — M79675 Pain in left toe(s): Secondary | ICD-10-CM | POA: Diagnosis not present

## 2023-06-06 DIAGNOSIS — I5022 Chronic systolic (congestive) heart failure: Secondary | ICD-10-CM | POA: Insufficient documentation

## 2023-06-06 DIAGNOSIS — R0683 Snoring: Secondary | ICD-10-CM | POA: Diagnosis not present

## 2023-06-06 DIAGNOSIS — M79674 Pain in right toe(s): Secondary | ICD-10-CM

## 2023-06-06 NOTE — Progress Notes (Signed)
This patient returns to my office for at risk foot care.  This patient requires this care by a professional since this patient will be at risk due to having diabetes. And CKD.  This patient is unable to cut nails herself since the patient cannot reach her nails.These nails are painful walking and wearing shoes.  This patient presents for at risk foot care today.  General Appearance  Alert, conversant and in no acute stress.  Vascular  Dorsalis pedis and posterior tibial  pulses are palpable  bilaterally.  Capillary return is within normal limits  bilaterally. Temperature is within normal limits  bilaterally.  Neurologic  Senn-Weinstein monofilament wire test within normal limits  bilaterally. Muscle power within normal limits bilaterally.  Nails Thick disfigured discolored nails with subungual debris  from hallux to fifth toes bilaterally. No evidence of bacterial infection or drainage bilaterally.  Orthopedic  No limitations of motion  feet .  No crepitus or effusions noted.  HAV  B/L.  Skin  normotropic skin with no porokeratosis noted bilaterally.  No signs of infections or ulcers noted.     Onychomycosis  Pain in right toes  Pain in left toes  Consent was obtained for treatment procedures.   Mechanical debridement of nails 1-5  bilaterally performed with a nail nipper.  Filed with dremel without incident.    Return office visit     9 weeks                 Told patient to return for periodic foot care and evaluation due to potential at risk complications.   Helane Gunther DPM

## 2023-06-08 NOTE — Procedures (Signed)
   Patient Name: Keyia, Shayne Date: 06/06/2023 Gender: Female D.O.B: [birth year] Age (years): 57 Referring Provider: Maximino Sarin PA-C Height (inches): 68 Interpreting Physician: Armanda Magic MD, ABSM Weight (lbs): 240 RPSGT: Ulyess Mort BMI: 36 MRN: [id number] Neck Size: 14.00  CLINICAL INFORMATION Sleep Study Type: NPSG  Indication for sleep study: Congestive Heart Failure, Daytime Fatigue, Diabetes, Hypertension, Morbid Obesity, Obesity, Snoring  Epworth Sleepiness Score: 18  SLEEP STUDY TECHNIQUE As per the AASM Manual for the Scoring of Sleep and Associated Events v2.3 (April 2016) with a hypopnea requiring 4% desaturations.  The channels recorded and monitored were frontal, central and occipital EEG, electrooculogram (EOG), submentalis EMG (chin), nasal and oral airflow, thoracic and abdominal wall motion, anterior tibialis EMG, snore microphone, electrocardiogram, and pulse oximetry.  MEDICATIONS Medications self-administered by patient taken the night of the study : ATORVASTATIN, COREG, entresto, HYDRALAZINE, mexiletine, xarelto  SLEEP ARCHITECTURE The study was initiated at 10:53:08 PM and ended at 4:56:58 AM.  Sleep onset time was 53.9 minutes and the sleep efficiency was 74.2%. The total sleep time was 270 minutes.  Stage REM latency was 111.0 minutes.  The patient spent 9.6% of the night in stage N1 sleep, 72.6% in stage N2 sleep, 0.0% in stage N3 and 17.8% in REM.  Alpha intrusion was absent.  Supine sleep was 0.00%.  RESPIRATORY PARAMETERS The overall apnea/hypopnea index (AHI) was 1.8 per hour. There were 0 total apneas, including 0 obstructive, 0 central and 0 mixed apneas. There were 8 hypopneas and 49 RERAs.  The AHI during Stage REM sleep was 1.3 per hour.  AHI while supine was N/A per hour.  The mean oxygen saturation was 93.1%. The minimum SpO2 during sleep was 90.0%.  moderate snoring was noted during this study.  CARDIAC  DATA The 2 lead EKG demonstrated sinus rhythm. The mean heart rate was 65.7 beats per minute. Other EKG findings include: Freqeunt PACs and PVCs.  LEG MOVEMENT DATA The total PLMS were 0 with a resulting PLMS index of 0.0. Associated arousal with leg movement index was 0.4 .  IMPRESSIONS - No significant obstructive sleep apnea occurred during this study (AHI = 1.8/h). - The patient had minimal or no oxygen desaturation during the study (Min O2 = 90.0%) - The patient snored with moderate snoring volume. - Frequent PACs and PVCs were noted during this study. - Clinically significant periodic limb movements did not occur during sleep. No significant associated arousals.  DIAGNOSIS - Normal Study - PVCs - PACs  RECOMMENDATIONS - Avoid alcohol, sedatives and other CNS depressants that may worsen sleep apnea and disrupt normal sleep architecture. - Sleep hygiene should be reviewed to assess factors that may improve sleep quality. - Weight management and regular exercise should be initiated or continued if appropriate.  [Electronically signed] 06/08/2023 08:42 AM  Armanda Magic MD, ABSM

## 2023-06-09 ENCOUNTER — Other Ambulatory Visit (HOSPITAL_COMMUNITY): Payer: Self-pay

## 2023-06-09 ENCOUNTER — Other Ambulatory Visit: Payer: Self-pay

## 2023-06-09 ENCOUNTER — Telehealth (HOSPITAL_COMMUNITY): Payer: Self-pay

## 2023-06-09 NOTE — Telephone Encounter (Signed)
Received Cardiac Clearance form from [facility] requesting patient be cleared for the following procedure Colonoscopy with propofol. Form was placed in APP folder on 20/Jun/2024 for signature. Will update chart once clearance has been reviewed and signed by provider.

## 2023-06-10 ENCOUNTER — Other Ambulatory Visit (HOSPITAL_COMMUNITY): Payer: Self-pay

## 2023-06-10 MED ORDER — INSULIN GLARGINE SOLOSTAR 100 UNIT/ML ~~LOC~~ SOPN
22.0000 [IU] | PEN_INJECTOR | Freq: Every day | SUBCUTANEOUS | 1 refills | Status: DC
  Filled 2023-06-10: qty 18, 81d supply, fill #0
  Filled 2023-09-19: qty 18, 81d supply, fill #1

## 2023-06-10 NOTE — Telephone Encounter (Signed)
Medical clearance form was signed by Prince Rome, NP and successfully faxed to [phone] on Friday, June 21,. Form will be scanned into patients chart.

## 2023-06-21 ENCOUNTER — Other Ambulatory Visit (HOSPITAL_COMMUNITY): Payer: Self-pay

## 2023-07-06 ENCOUNTER — Other Ambulatory Visit (HOSPITAL_COMMUNITY): Payer: Self-pay

## 2023-07-12 ENCOUNTER — Other Ambulatory Visit (HOSPITAL_COMMUNITY): Payer: Self-pay

## 2023-07-12 MED ORDER — PEG 3350-KCL-NA BICARB-NACL 420 G PO SOLR
ORAL | 0 refills | Status: DC
  Filled 2023-07-12: qty 4000, 2d supply, fill #0

## 2023-07-27 ENCOUNTER — Telehealth: Payer: Self-pay | Admitting: *Deleted

## 2023-07-27 NOTE — Telephone Encounter (Signed)
The patient has been notified of the result and verbalized understanding.  All questions (if any) were answered. Latrelle Dodrill, CMA 07/27/2023 1:29 PM    Pt is agreeable to normal results.

## 2023-07-27 NOTE — Telephone Encounter (Signed)
-----   Message from Armanda Magic sent at 06/08/2023  8:44 AM EDT ----- Please let patient know that sleep study showed no significant sleep apnea.

## 2023-08-01 ENCOUNTER — Other Ambulatory Visit: Payer: Self-pay

## 2023-08-01 ENCOUNTER — Other Ambulatory Visit (HOSPITAL_COMMUNITY): Payer: Self-pay | Admitting: Internal Medicine

## 2023-08-01 ENCOUNTER — Other Ambulatory Visit (HOSPITAL_COMMUNITY): Payer: Self-pay

## 2023-08-01 DIAGNOSIS — I10 Essential (primary) hypertension: Secondary | ICD-10-CM

## 2023-08-02 ENCOUNTER — Other Ambulatory Visit (HOSPITAL_COMMUNITY): Payer: Self-pay

## 2023-08-02 MED ORDER — CARVEDILOL 6.25 MG PO TABS
6.2500 mg | ORAL_TABLET | Freq: Two times a day (BID) | ORAL | 3 refills | Status: DC
Start: 2023-08-02 — End: 2024-10-08
  Filled 2023-08-02: qty 180, 90d supply, fill #0
  Filled 2024-01-20: qty 180, 90d supply, fill #1
  Filled 2024-05-24: qty 180, 90d supply, fill #2

## 2023-08-02 MED ORDER — MEXILETINE HCL 150 MG PO CAPS
300.0000 mg | ORAL_CAPSULE | Freq: Two times a day (BID) | ORAL | 6 refills | Status: DC
  Filled 2023-08-02: qty 120, 30d supply, fill #0
  Filled 2023-09-27: qty 120, 30d supply, fill #1
  Filled 2023-12-19: qty 120, 30d supply, fill #2
  Filled 2024-02-28: qty 120, 30d supply, fill #3
  Filled 2024-05-01: qty 120, 30d supply, fill #4
  Filled 2024-06-07: qty 120, 30d supply, fill #5

## 2023-08-04 ENCOUNTER — Other Ambulatory Visit (HOSPITAL_COMMUNITY): Payer: Self-pay | Admitting: Cardiology

## 2023-08-04 ENCOUNTER — Other Ambulatory Visit (HOSPITAL_COMMUNITY): Payer: Self-pay

## 2023-08-04 MED ORDER — NIFEDIPINE ER OSMOTIC RELEASE 60 MG PO TB24
60.0000 mg | ORAL_TABLET | Freq: Every day | ORAL | 3 refills | Status: DC
  Filled 2023-08-04: qty 90, 90d supply, fill #0
  Filled 2023-12-19: qty 90, 90d supply, fill #1
  Filled 2024-03-21: qty 90, 90d supply, fill #2
  Filled 2024-04-25 – 2024-07-15 (×2): qty 90, 90d supply, fill #3

## 2023-08-12 ENCOUNTER — Encounter: Payer: Self-pay | Admitting: Podiatry

## 2023-08-12 ENCOUNTER — Ambulatory Visit (INDEPENDENT_AMBULATORY_CARE_PROVIDER_SITE_OTHER): Payer: Medicare Other | Admitting: Podiatry

## 2023-08-12 DIAGNOSIS — B351 Tinea unguium: Secondary | ICD-10-CM | POA: Diagnosis not present

## 2023-08-12 DIAGNOSIS — M79675 Pain in left toe(s): Secondary | ICD-10-CM | POA: Diagnosis not present

## 2023-08-12 DIAGNOSIS — E1165 Type 2 diabetes mellitus with hyperglycemia: Secondary | ICD-10-CM | POA: Diagnosis not present

## 2023-08-12 DIAGNOSIS — Z794 Long term (current) use of insulin: Secondary | ICD-10-CM

## 2023-08-12 DIAGNOSIS — M79674 Pain in right toe(s): Secondary | ICD-10-CM

## 2023-08-12 NOTE — Progress Notes (Signed)
This patient returns to my office for at risk foot care.  This patient requires this care by a professional since this patient will be at risk due to having diabetes. And CKD.  This patient is unable to cut nails herself since the patient cannot reach her nails.These nails are painful walking and wearing shoes.  This patient presents for at risk foot care today.  General Appearance  Alert, conversant and in no acute stress.  Vascular  Dorsalis pedis and posterior tibial  pulses are palpable  bilaterally.  Capillary return is within normal limits  bilaterally. Temperature is within normal limits  bilaterally.  Neurologic  Senn-Weinstein monofilament wire test within normal limits  bilaterally. Muscle power within normal limits bilaterally.  Nails Thick disfigured discolored nails with subungual debris  from hallux to fifth toes bilaterally. No evidence of bacterial infection or drainage bilaterally.  Orthopedic  No limitations of motion  feet .  No crepitus or effusions noted.  HAV  B/L.  Skin  normotropic skin with no porokeratosis noted bilaterally.  No signs of infections or ulcers noted.     Onychomycosis  Pain in right toes  Pain in left toes  Consent was obtained for treatment procedures.   Mechanical debridement of nails 1-5  bilaterally performed with a nail nipper.  Filed with dremel without incident.    Return office visit     9 weeks                 Told patient to return for periodic foot care and evaluation due to potential at risk complications.   Helane Gunther DPM

## 2023-09-02 NOTE — Progress Notes (Signed)
Advanced Heart Failure Clinic Note PCP: Dr. Laural Benes  Primary Cardiologist: Dr. Bjorn Pippin HF MD: Dr. Gala Romney Nephorology: Dr. Marisue Humble   HPI: Adrienne Lane is a 57 y.o. woman with morbid obesity, HTN, DM2, PAF, PVCs, CKD3b and chronic systolic HF.   Admitted to [facility] 10/20 after a mechanical fall. ECHO EF 20-25% with severe RV dysfunction . 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 Renal function worsened. CT showed bilateral staghorn calculi without hydronephrosis and severe anasarca. Also found to have uterine mass felt to be large fibroid. Diuresed 90 pounds. Placed on carvedilol and mexiletine due to high PVC burden.   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 Saw Dr. Ladona Ridgel who was considering PVC ablation. Doubted mexilitene is helping much.  Admitted 6/21 with AKI on CKD. CT scan of the abdomen which showed bilateral hydronephrosis and large fibroid uterus.  Creatinine on presentation was 3.24. Bilateral ureteral stent placement with bilateral retrograde pyelography. Creatinine improved.   S/p total abdominal hysterectomy and bilateral salpingoopherectomy 8/21  Admitted 1/22 for acute CVA. Presented w/ facial droop and blurry vision. She presented outside of window for TPA.  Seen by neurology/stroke team.  MRI brain showed acute infarct in the right hemipons, additional small acute infarct in the left frontal subcortical white matter.  Mild associated edema in the right pons without substantial mass-effect. Echo EF 45-50%. Eliquis was switched to Pradaxa. She was discharged to CIR. While in CIR she developed upper respiratory symptoms and tested + for COVID and was readmitted to [facility] under TRH.   Zio 4/22 showed frequent PVCs but EF stable and asymptomatic. Continued on mexiltiene.    Echo 5/24 showed EF improved 55-60%  PSG 6/24 no significant OSA  Today she returns for HF follow up. Overall feeling fine. She has SOB walking up > 2  flights of steps but otherwise no dyspnea. She has occasional positional dizziness, no falls. Denies palpitations, abnormal bleeding, CP, edema, or PND/Orthopnea. Appetite ok. No fever or chills. Weight at home 246 pounds. Taking all medications, asking if she can stop some of her medications. Considering bariatric surgery   Cardiac Studies:   - Echo (5/24): EF 55-60%, RV ok  - Zio (4/22): 740 runs SVT, PVCs 21%. PACs 5.6%  - Echo (1/22): EF 45-50%. RV mildly reduced  - Echo (2/21); EF 40-45% hard to assess with PVCs. (read formally as 50-55%) RV mildly down No effusion.   - Echo (11/20: EF 20-25%, pericardial effusion, mild LVH, RA/LA dilated.  RV down.   - RHC 11/12/19  RA = 11 RV = 47/15 PA = 49/17 (32) PCW = 20 Fick cardiac output/index = 8.8/3.8 PVR = 1.4 WU FA sat = 97% PA sat = 70%, 72% High SVC = 68%  ROS: All systems negative except as listed in HPI, PMH and Problem List.  SH:  Social History   Socioeconomic History   Marital status: Married    Spouse name: Not on file   Number of children: 0   Years of education: Not on file   Highest education level: Associate degree: occupational, Scientist, product/process development, or vocational program  Occupational History   Occupation: unemployed  Tobacco Use   Smoking status: Never   Smokeless tobacco: Never  Vaping Use   Vaping status: Never Used  Substance and Sexual Activity   Alcohol use: Never   Drug use: Never   Sexual activity: Yes    Birth control/protection: Surgical    Comment: Hysterectomy  Other Topics  Concern   Not on file  Social History Narrative   Not on file   Social Determinants of Health   Financial Resource Strain: Not on File (04/08/2022)   Received from [employer], [place]   Financial Resource Strain    Financial Resource Strain: 0  Food Insecurity: Not on file (08/24/2023)  Transportation Needs: No Transportation Needs (08/09/2022)   PRAPARE - Administrator, Civil Service (Medical): No    Lack of  Transportation (Non-Medical): No  Physical Activity: Not on File (04/08/2022)   Received from [place], [place]   Physical Activity    Physical Activity: 0  Stress: Not on File (04/08/2022)   Received from [facility], [place]   Stress    Stress: 0  Social Connections: Not on File (09/02/2023)   Received from [facility]   Social Connections    Connectedness: 0  Intimate Partner Violence: Unknown (03/25/2022)   Received from [employer], [facility]   HITS    Physically Hurt: Not on file    Insult or Talk Down To: Not on file    Threaten Physical Harm: Not on file    Scream or Curse: Not on file   FH:  Family History  Problem Relation Age of Onset   Atrial fibrillation Mother    Hypertension Mother    Stroke Mother 42   Diabetes Mother    Diabetes Father    Hypertension Father    Diabetes Sister    Hypertension Sister    Diabetes Brother    Hypertension Brother    Diabetes Brother    Heart attack Brother    Stroke Maternal Grandmother 45   Past Medical History:  Diagnosis Date   Atrial fibrillation with RVR (HCC)    Bigeminy 01/2020   Cataract    CHF (congestive heart failure) (HCC) 10/20/2019   CKD (chronic kidney disease), stage IV (HCC)    Diabetes mellitus without complication (HCC)    Diabetic retinopathy (HCC)    DOE (dyspnea on exertion)    walking upstairs or up hill resolves in one minute   Dysrhythmia    a-fib - on pradaxa   Fibroids    History of kidney stones    History of recent blood transfusion 02/26/2020   Hypertension    Hypertensive retinopathy    Iron deficiency anemia    Non-ischemic cardiomyopathy (HCC)    tachycardia induced   Obese    Peripheral edema    Premature ventricular contractions (PVCs) (VPCs)    Sleep apnea    does not use cpap   Stroke (HCC) 12/2020   Umbilical hernia    Wears glasses    Current Outpatient Medications  Medication Sig Dispense Refill   acetaminophen (TYLENOL) 500 MG tablet Take 500 mg by mouth every 6 (six) hours as  needed for moderate pain or headache.     amLODipine (NORVASC) 10 MG tablet Take 1 tablet (10 mg total) by mouth daily. 30 tablet 6   aspirin EC 81 MG tablet Take 81 mg by mouth daily. Swallow whole.     atorvastatin (LIPITOR) 80 MG tablet Take 1 tablet (80 mg total) by mouth at bedtime. 90 tablet 2   Bromfenac Sodium (PROLENSA) 0.07 % SOLN Place 1 drop into the left eye 4 (four) times daily. 3 mL 3   carvedilol (COREG) 6.25 MG tablet Take 1 tablet (6.25 mg total) by mouth 2 (two) times daily with a meal. 180 tablet 3   empagliflozin (JARDIANCE) 10 MG TABS tablet  Take 1 Tablet by mouth daily. 90 tablet 1   glucose blood (FREESTYLE TEST STRIPS) test strip Use 1 strip 3 (three) times daily 100 each 11   glucose blood (TRUE METRIX BLOOD GLUCOSE TEST) test strip Use to check fasting blood glucose 3 times daily 50 strip 6   hydrALAZINE (APRESOLINE) 100 MG tablet Take 1 tablet (100 mg total) by mouth 3 (three) times daily. 90 tablet 3   Insulin Glargine Solostar (LANTUS) 100 UNIT/ML Solostar Pen Inject 22 Units into the skin daily. 18 mL 1   Insulin Pen Needle 31G X 5 MM MISC Inject 1 each into the skin daily. 100 each 3   Insulin Syringe-Needle U-100 30G X 5/16" 0.5 ML MISC Use as directed. 100 each 11   isosorbide mononitrate (IMDUR) 60 MG 24 hr tablet Take 1 tablet (60 mg total) by mouth daily. 30 tablet 3   ketorolac (ACULAR) 0.5 % ophthalmic solution Place 1 drop into the left eye 4 (four) times daily. 10 mL 3   L-LYSINE PO Take 1 tablet by mouth every morning.     Lancets (ONETOUCH ULTRASOFT) lancets Check CBG twice a day 60 each 5   mexiletine (MEXITIL) 150 MG capsule Take 2 capsules (300 mg total) by mouth every 12 (twelve) hours. 120 capsule 6   Multiple Vitamins-Minerals (WOMENS MULTI PO) Take 1 tablet by mouth daily.     Multiple Vitamins-Minerals (ZINC PO) Take 1 capsule by mouth daily.     NIFEdipine (PROCARDIA XL/NIFEDICAL XL) 60 MG 24 hr tablet Take 1 tablet (60 mg total) by mouth daily.  90 tablet 3   oxyCODONE-acetaminophen (PERCOCET/ROXICET) 5-325 MG tablet Take 1-2 tablets by mouth every 6 (six) hours as needed for severe pain. 15 tablet 0   prednisoLONE acetate (PRED FORTE) 1 % ophthalmic suspension Place 1 drop into the left eye 4 (four) times daily. 10 mL 0   rivaroxaban (XARELTO) 20 MG TABS tablet Take 1 tablet (20 mg total) by mouth daily with supper. 30 tablet 11   sacubitril-valsartan (ENTRESTO) 24-26 MG Take 1 tablet by mouth 2 (two) times daily. 180 tablet 3   spironolactone (ALDACTONE) 25 MG tablet Take 25 mg by mouth daily.     TRUEplus Lancets 28G MISC Use to check fasting blood glucose 3 times daily 100 each 3   Turmeric (QC TUMERIC COMPLEX PO) Take 1 capsule by mouth daily.     UNABLE TO FIND Med Name: Milk thistle. Patient takes 1 capsule by mouth in the evening.     Vitamin D-Vitamin K (VITAMIN K2-VITAMIN D3 PO) Take 1 tablet by mouth daily.     insulin aspart (NOVOLOG) 100 UNIT/ML injection Use as directed using sliding scale three times daily. (Patient taking differently: Inject 2-5 Units into the skin 3 (three) times daily with meals. Use as directed using sliding scale three times daily. 2 units to 5 units as needed) 10 mL PRN   No current facility-administered medications for this encounter.   BP 128/70   Pulse 76   Wt 114.2 kg (251 lb 12.8 oz)   LMP 05/12/2020 (Exact Date)   SpO2 99%   BMI 38.29 kg/m   Wt Readings from Last 3 Encounters:  09/05/23 114.2 kg (251 lb 12.8 oz)  06/06/23 108.9 kg (240 lb)  05/06/23 112 kg (247 lb)   PHYSICAL EXAM: General:  NAD. No resp difficulty, walked into clinic HEENT: Normal Neck: Supple. No JVD. Carotids 2+ bilat; no bruits. No lymphadenopathy or thryomegaly appreciated. Cor: PMI  nondisplaced. Regular rate & rhythm. No rubs, gallops or murmurs. Lungs: Clear Abdomen: Soft, obese, nontender, nondistended. No hepatosplenomegaly. No bruits or masses. Good bowel sounds. Extremities: No cyanosis, clubbing, rash,  edema Neuro: Alert & oriented x 3, cranial nerves grossly intact. Moves all 4 extremities w/o difficulty. Affect pleasant.  ASSESSMENT & PLAN: 1. Chronic Systolic Heart Failure - Echo (11/20):  EF 20-25%, pericardial effusion, mild LVH, RA/LA dilated.  RV . Possible Tachy Mediated cardiomyopathy verus PVC. She has not had LHC due to elevated creatinine.  - Had RHC (11/20) with preserved cardiac output.  - Echo (2/21): EF 40-45% (Hard to assess with PVCs) RV mild HK. No effusion - Echo (1/22): EF 45-50%. RV mildly reduced  - Echo (5/24): EF 55-60%, RV ok - Stable NYHA II, volume OK. Not requiring loop diuretic - Continue spiro 25 mg daily - Continue Entresto 24/26 mg bid. - Continue Coreg 6.25 mg bid - Continue Jardiance 10 mg daily  - Continue hydralazine 100 mg tid + Imdur 60 mg daily.  - Labs today  2. Frequent PVCs  - Failed amio - Zio Patch 12/20 Frequent PVCs (19.0%, 42278) with two predominant morphologies (9.8 & 9.3%, respectively).  - Zio 4/22 740 runs SVT, PVCs 21%, PACs 5.6% - On mexilitene 300 mg bid. - She is asking about stopping mexiletine. I asked her to discussed further with Dr. Ladona Ridgel regarding risks/ benefits stopping med and having a plan in place should PVCs recur and reduce EF (? Ablation)   3. PAF  - Regular on exam today. - Continue carvedilol 6.25 mg bid.  - Had CVA w/ Eliquis, now on Xarelto. No bleeding issues.    4. CKD 3b - Baseline SCr 1.8-2 - Follows with Nephrology.  - Continue SGLT2i.  - labs today.  5. HTN  - BP stable - GDMT as above.  - I asked her to check BP daily and log. If dizziness worsens, would pull back on hydral   6. Type 2 DM - Continue Jardiance - She in on insulin - Per PCP   7. Uterine Fibroids  - s/p total abdominal hysterectomy and bilateral salpingoopherectomy  8. Snoring - no significant OSA on PSG 6/24  9. CVA - CVA 1/21 in setting of Afib - Failed Eliquis, now on Xarelto. - Followed by Dr. Pearlean Brownie.  10.  Obesity - Body mass index is 38.29 kg/m. - Not interested in GLP1RAs - Has referral for bariatric surgeon - She would be at an acceptable cardiac risk for elective surgery  Follow up 6 months with Dr. Gillie Manners FNP-BC  10:18 AM

## 2023-09-05 ENCOUNTER — Encounter (HOSPITAL_COMMUNITY): Payer: Self-pay

## 2023-09-05 ENCOUNTER — Other Ambulatory Visit (HOSPITAL_COMMUNITY): Payer: Self-pay

## 2023-09-05 ENCOUNTER — Ambulatory Visit (HOSPITAL_COMMUNITY)
Admission: RE | Admit: 2023-09-05 | Discharge: 2023-09-05 | Disposition: A | Discharge status: Home / Self Care | Payer: Medicare Other | Source: Ambulatory Visit | Attending: Family Medicine | Admitting: Family Medicine

## 2023-09-05 VITALS — BP 128/70 | HR 76 | Wt 251.8 lb

## 2023-09-05 DIAGNOSIS — Z7901 Long term (current) use of anticoagulants: Secondary | ICD-10-CM | POA: Diagnosis not present

## 2023-09-05 DIAGNOSIS — N1832 Chronic kidney disease, stage 3b: Secondary | ICD-10-CM | POA: Diagnosis present

## 2023-09-05 DIAGNOSIS — E1122 Type 2 diabetes mellitus with diabetic chronic kidney disease: Secondary | ICD-10-CM | POA: Insufficient documentation

## 2023-09-05 DIAGNOSIS — D219 Benign neoplasm of connective and other soft tissue, unspecified: Secondary | ICD-10-CM

## 2023-09-05 DIAGNOSIS — N183 Chronic kidney disease, stage 3 unspecified: Secondary | ICD-10-CM

## 2023-09-05 DIAGNOSIS — R0683 Snoring: Secondary | ICD-10-CM | POA: Insufficient documentation

## 2023-09-05 DIAGNOSIS — I130 Hypertensive heart and chronic kidney disease with heart failure and stage 1 through stage 4 chronic kidney disease, or unspecified chronic kidney disease: Secondary | ICD-10-CM | POA: Insufficient documentation

## 2023-09-05 DIAGNOSIS — Z6838 Body mass index (BMI) 38.0-38.9, adult: Secondary | ICD-10-CM | POA: Insufficient documentation

## 2023-09-05 DIAGNOSIS — I480 Paroxysmal atrial fibrillation: Secondary | ICD-10-CM | POA: Insufficient documentation

## 2023-09-05 DIAGNOSIS — I5022 Chronic systolic (congestive) heart failure: Secondary | ICD-10-CM | POA: Diagnosis present

## 2023-09-05 DIAGNOSIS — E669 Obesity, unspecified: Secondary | ICD-10-CM

## 2023-09-05 DIAGNOSIS — I6350 Cerebral infarction due to unspecified occlusion or stenosis of unspecified cerebral artery: Secondary | ICD-10-CM

## 2023-09-05 DIAGNOSIS — Z8673 Personal history of transient ischemic attack (TIA), and cerebral infarction without residual deficits: Secondary | ICD-10-CM | POA: Insufficient documentation

## 2023-09-05 DIAGNOSIS — I10 Essential (primary) hypertension: Secondary | ICD-10-CM

## 2023-09-05 DIAGNOSIS — Z794 Long term (current) use of insulin: Secondary | ICD-10-CM

## 2023-09-05 DIAGNOSIS — I493 Ventricular premature depolarization: Secondary | ICD-10-CM | POA: Diagnosis not present

## 2023-09-05 LAB — BASIC METABOLIC PANEL
Anion gap: 8 (ref 5–15)
BUN: 29 mg/dL — ABNORMAL HIGH (ref 6–20)
CO2: 21 mmol/L — ABNORMAL LOW (ref 22–32)
Calcium: 8.8 mg/dL — ABNORMAL LOW (ref 8.9–10.3)
Chloride: 113 mmol/L — ABNORMAL HIGH (ref 98–111)
Creatinine, Ser: 1.88 mg/dL — ABNORMAL HIGH (ref 0.44–1.00)
GFR, Estimated: 31 mL/min — ABNORMAL LOW (ref >60)
Glucose, Bld: 142 mg/dL — ABNORMAL HIGH (ref 70–99)
Potassium: 3.5 mmol/L (ref 3.5–5.1)
Sodium: 142 mmol/L (ref 135–145)

## 2023-09-05 NOTE — Addendum Note (Signed)
Encounter addended by: Faythe Casa, CMA on: 09/05/2023 11:36 AM  Actions taken: Charge Capture section accepted

## 2023-09-05 NOTE — Progress Notes (Deleted)
ReDS Vest / Clip - 09/05/23 1100       ReDS Vest / Clip   Station Marker C    Ruler Value 27    ReDS Value Range Low volume    ReDS Actual Value 33

## 2023-09-05 NOTE — Patient Instructions (Addendum)
No change in medications. Labs today - will call you if abnormal. Continue to check your blood pressure daily at home and notify clinic if your dizziness worsens. Return to see Dr. Gala Romney in 6 months. Our office should call and schedule this appointment or you can call us in January to schedule. 5.   Please call Dr. Ladona Ridgel to schedule your follow up at:  Dr. Lewayne Bunting Address: [address], [place], [place] [address] Phone: [phone]  6. Please call us at [phone] if any issues or concerns prior to your next visit with [place].

## 2023-09-18 ENCOUNTER — Other Ambulatory Visit (INDEPENDENT_AMBULATORY_CARE_PROVIDER_SITE_OTHER): Payer: Self-pay | Admitting: Ophthalmology

## 2023-09-19 ENCOUNTER — Other Ambulatory Visit (HOSPITAL_COMMUNITY): Payer: Self-pay

## 2023-09-19 ENCOUNTER — Other Ambulatory Visit: Payer: Self-pay

## 2023-09-21 ENCOUNTER — Other Ambulatory Visit (INDEPENDENT_AMBULATORY_CARE_PROVIDER_SITE_OTHER): Payer: Self-pay

## 2023-09-21 ENCOUNTER — Other Ambulatory Visit: Payer: Self-pay

## 2023-09-21 MED ORDER — PREDNISOLONE ACETATE 1 % OP SUSP
1.0000 [drp] | Freq: Four times a day (QID) | OPHTHALMIC | 0 refills | Status: DC
  Filled 2023-09-21: qty 15, 75d supply, fill #0

## 2023-09-27 NOTE — Progress Notes (Addendum)
[facility] - Clinic Note  10/05/2023     CHIEF COMPLAINT Patient presents for Retina Follow Up  HISTORY OF PRESENT ILLNESS: Adrienne Lane is a 57 y.o. female who presents to the clinic today for:  HPI     Retina Follow Up   Patient presents with  Diabetic Retinopathy.  In both eyes.  This started 11 months ago.  I, the attending physician,  performed the HPI with the patient and updated documentation appropriately.        Comments   Patient here for 8 weeks (11 months) retina follow up for PDR OU. Patient states vision doing pretty good. No eye pain. They get dry and has to blink a lot to moisten them up. Has new glasses. Using gray top once a day OS and Pink top once a day OS instead of QID.      Last edited by Rennis Chris, MD on 10/08/2023  1:33 AM.    Patient has been lost to follow up from 8 weeks to 11 months (11.10.23-10.16.24), pt is still using PF and Ketorolac once a day in the left eye, her left eye vision has improved, she states she is no longer seeing floaters and does not have hazy vision anymore  Referring physician: Olivia Canter, MD [address] [place],  [place] [address]  HISTORICAL INFORMATION:   Selected notes from the MEDICAL RECORD NUMBER Referred by Dr. Fabian Sharp for concern of VH   CURRENT MEDICATIONS: Current Outpatient Medications (Ophthalmic Drugs)  Medication Sig   Bromfenac Sodium (PROLENSA) 0.07 % SOLN Place 1 drop into the left eye 4 (four) times daily.   ketorolac (ACULAR) 0.5 % ophthalmic solution Place 1 drop into the left eye 4 (four) times daily.   prednisoLONE acetate (PRED FORTE) 1 % ophthalmic suspension Place 1 drop into the left eye 4 (four) times daily.   No current facility-administered medications for this visit. (Ophthalmic Drugs)   Current Outpatient Medications (Other)  Medication Sig   acetaminophen (TYLENOL) 500 MG tablet Take 500 mg by mouth every 6 (six) hours as needed for moderate  pain or headache.   amLODipine (NORVASC) 10 MG tablet Take 1 tablet (10 mg total) by mouth daily.   aspirin EC 81 MG tablet Take 81 mg by mouth daily. Swallow whole.   atorvastatin (LIPITOR) 80 MG tablet Take 1 tablet (80 mg total) by mouth at bedtime.   carvedilol (COREG) 6.25 MG tablet Take 1 tablet (6.25 mg total) by mouth 2 (two) times daily with a meal.   empagliflozin (JARDIANCE) 10 MG TABS tablet Take 1 Tablet by mouth daily.   glucose blood (FREESTYLE TEST STRIPS) test strip Use 1 strip 3 (three) times daily   glucose blood (TRUE METRIX BLOOD GLUCOSE TEST) test strip Use to check fasting blood glucose 3 times daily   hydrALAZINE (APRESOLINE) 100 MG tablet Take 1 tablet (100 mg total) by mouth 3 (three) times daily.   insulin aspart (NOVOLOG) 100 UNIT/ML injection Use as directed using sliding scale three times daily. (Patient taking differently: Inject 2-5 Units into the skin 3 (three) times daily with meals. Use as directed using sliding scale three times daily. 2 units to 5 units as needed)   Insulin Glargine Solostar (LANTUS) 100 UNIT/ML Solostar Pen Inject 22 Units into the skin daily.   Insulin Pen Needle 31G X 5 MM MISC Inject 1 each into the skin daily.   Insulin Syringe-Needle U-100 30G X 5/16"  0.5 ML MISC Use as directed.   isosorbide mononitrate (IMDUR) 60 MG 24 hr tablet Take 1 tablet (60 mg total) by mouth daily.   L-LYSINE PO Take 1 tablet by mouth every morning.   Lancets (ONETOUCH ULTRASOFT) lancets Check CBG twice a day   mexiletine (MEXITIL) 150 MG capsule Take 2 capsules (300 mg total) by mouth every 12 (twelve) hours.   Multiple Vitamins-Minerals (WOMENS MULTI PO) Take 1 tablet by mouth daily.   Multiple Vitamins-Minerals (ZINC PO) Take 1 capsule by mouth daily.   NIFEdipine (PROCARDIA XL/NIFEDICAL XL) 60 MG 24 hr tablet Take 1 tablet (60 mg total) by mouth daily.   oxyCODONE-acetaminophen (PERCOCET/ROXICET) 5-325 MG tablet Take 1-2 tablets by mouth every 6 (six) hours  as needed for severe pain.   rivaroxaban (XARELTO) 20 MG TABS tablet Take 1 tablet (20 mg total) by mouth daily with supper.   sacubitril-valsartan (ENTRESTO) 24-26 MG Take 1 tablet by mouth 2 (two) times daily.   spironolactone (ALDACTONE) 25 MG tablet Take 25 mg by mouth daily.   TRUEplus Lancets 28G MISC Use to check fasting blood glucose 3 times daily   Turmeric (QC TUMERIC COMPLEX PO) Take 1 capsule by mouth daily.   UNABLE TO FIND Med Name: Milk thistle. Patient takes 1 capsule by mouth in the evening.   Vitamin D-Vitamin K (VITAMIN K2-VITAMIN D3 PO) Take 1 tablet by mouth daily.   No current facility-administered medications for this visit. (Other)   REVIEW OF SYSTEMS: ROS   Positive for: Genitourinary, Endocrine, Cardiovascular, Eyes, Respiratory Negative for: Constitutional, Gastrointestinal, Neurological, Skin, Musculoskeletal, HENT, Psychiatric, Allergic/Imm, Heme/Lymph Last edited by Laddie Aquas, COA on 10/05/2023  9:58 AM.       ALLERGIES Allergies  Allergen Reactions   Adhesive [Tape]     Tears skin, can tolerate paper tape   PAST MEDICAL HISTORY Past Medical History:  Diagnosis Date   Atrial fibrillation with RVR (HCC)    Bigeminy 01/2020   Cataract    CHF (congestive heart failure) (HCC) 10/20/2019   CKD (chronic kidney disease), stage IV (HCC)    Diabetes mellitus without complication (HCC)    Diabetic retinopathy (HCC)    DOE (dyspnea on exertion)    walking upstairs or up hill resolves in one minute   Dysrhythmia    a-fib - on pradaxa   Fibroids    History of kidney stones    History of recent blood transfusion 02/26/2020   Hypertension    Hypertensive retinopathy    Iron deficiency anemia    Non-ischemic cardiomyopathy (HCC)    tachycardia induced   Obese    Peripheral edema    Premature ventricular contractions (PVCs) (VPCs)    Sleep apnea    does not use cpap   Stroke (HCC) 12/2020   Umbilical hernia    Wears glasses    Past  Surgical History:  Procedure Laterality Date   CATARACT EXTRACTION     CHOLECYSTECTOMY     CYSTOSCOPY W/ URETERAL STENT PLACEMENT Bilateral 05/24/2020   Procedure: CYSTOSCOPY WITH RETROGRADE PYELOGRAM/URETERAL STENT PLACEMENT;  Surgeon: Noel Christmas, MD;  Location: [facility];  Service: Urology;  Laterality: Bilateral;   CYSTOSCOPY/RETROGRADE/URETEROSCOPY Bilateral 04/03/2020   Procedure: CYSTOSCOPY/RETROGRADE/URETEROSCOPY;  Surgeon: Crist Fat, MD;  Location: [facility];  Service: Urology;  Laterality: Bilateral;   EYE SURGERY Left 08/2021   cataract removed   HYSTERECTOMY ABDOMINAL WITH SALPINGO-OOPHORECTOMY  07/21/2020   Procedure: HYSTERECTOMY ABDOMINAL WITH BILATERAL SALPINGO-OOPHORECTOMY;  Surgeon: Essie Hart, MD;  Location: [facility] OR;  Service: Gynecology;;   INJECTION OF SILICONE OIL Left 10/08/2021   Procedure: INJECTION OF SILICONE OIL;  Surgeon: Rennis Chris, MD;  Location: [facility] OR;  Service: Ophthalmology;  Laterality: Left;   IR FLUORO GUIDE CV LINE RIGHT  02/21/2020   IR US GUIDE VASC ACCESS RIGHT  02/21/2020   MEMBRANE PEEL Left 10/08/2021   Procedure: MEMBRANE PEEL;  Surgeon: Rennis Chris, MD;  Location: [facility] OR;  Service: Ophthalmology;  Laterality: Left;   NEPHROLITHOTOMY Left 02/14/2020   Procedure: NEPHROLITHOTOMY PERCUTANEOUS/ SURGEON ACCESS/ LEFT PERCUTANEOUS NEPHROSTOMY TUBE PLACEMENT;  Surgeon: Crist Fat, MD;  Location: [facility];  Service: Urology;  Laterality: Left;   NEPHROLITHOTOMY Left 02/21/2020   Procedure: NEPHROLITHOTOMY PERCUTANEOUS SECOND LOOK;  Surgeon: Crist Fat, MD;  Location: [facility];  Service: Urology;  Laterality: Left;   NEPHROLITHOTOMY Left 02/26/2020   Procedure: NEPHROLITHOTOMY PERCUTANEOUS;  Surgeon: Rene Paci, MD;  Location: [facility];  Service: Urology;  Laterality: Left;  NEED 150 MIN   NEPHROLITHOTOMY Right 03/24/2020   Procedure: NEPHROLITHOTOMY PERCUTANEOUS WITH ACCESS LEFT STENT REMOVAL;  Surgeon: Crist Fat, MD;  Location: [facility];  Service: Urology;  Laterality: Right;   PARS PLANA VITRECTOMY Left 10/08/2021   Procedure: PARS PLANA VITRECTOMY WITH 25 GAUGE;  Surgeon: Rennis Chris, MD;  Location: [facility] OR;  Service: Ophthalmology;  Laterality: Left;   PHOTOCOAGULATION WITH LASER Left 10/08/2021   Procedure: PHOTOCOAGULATION WITH LASER;  Surgeon: Rennis Chris, MD;  Location: [facility] OR;  Service: Ophthalmology;  Laterality: Left;   RIGHT HEART CATH N/A 11/09/2019   Procedure: RIGHT HEART CATH;  Surgeon: Dolores Patty, MD;  Location: [facility];  Service: Cardiovascular;  Laterality: N/A;   WISDOM TOOTH EXTRACTION     FAMILY HISTORY Family History  Problem Relation Age of Onset   Atrial fibrillation Mother    Hypertension Mother    Stroke Mother 60   Diabetes Mother    Diabetes Father    Hypertension Father    Diabetes Sister    Hypertension Sister    Diabetes Brother    Hypertension Brother    Diabetes Brother    Heart attack Brother    Stroke Maternal Grandmother 56   SOCIAL HISTORY Social History   Tobacco Use   Smoking status: Never   Smokeless tobacco: Never  Vaping Use   Vaping status: Never Used  Substance Use Topics   Alcohol use: Never   Drug use: Never       OPHTHALMIC EXAM:  Base Eye Exam     Visual Acuity (Snellen - Linear)       Right Left   Dist cc 20/20 20/400 -1   Dist ph cc  20/200 -2    Correction: Glasses         Tonometry (Tonopen, 9:51 AM)       Right Left   Pressure 14 24,25  Brimonidine and Cosopt given at 9:50 am        Pupils       Dark Light Shape React APD   Right 3 2 Round Brisk None   Left 4 4 Round Minimal None         Visual Fields (Counting fingers)       Left Right     Full   Restrictions Partial outer superior temporal, inferior temporal, superior nasal, inferior nasal deficiencies          Neuro/Psych     Oriented x3: Yes  Mood/Affect: Normal         Dilation     Both eyes:  1.0% Mydriacyl, 2.5% Phenylephrine @ 9:51 AM           Slit Lamp and Fundus Exam     External Exam       Right Left   External Normal Normal         Slit Lamp Exam       Right Left   Lids/Lashes Dermatochalasis - upper lid Dermatochalasis - upper lid   Conjunctiva/Sclera mild melanosis melanosis   Cornea mild arcus, trace PEE mild arcus, well healed cataract wound   Anterior Chamber Deep and clear deep and clear   Iris Round and dilated, No NVI Round and dilated, No NVI   Lens 2+ Nuclear sclerosis, 2+ Cortical cataract PC IOL in good position, 1-2+PCO   Anterior Vitreous Vitreous syneresis, scattered, white blood stained vitreous condensations post vitrectomy, good oil fill, +pigment         Fundus Exam       Right Left   Disc Pink and Sharp, Compact, mild fibrosis nasal, mild PPP temporally 1+pallor, sharp nasal rim, +temporal edema - improved   C/D Ratio 0.4 0.3   Macula Good foveal reflex, rare MA, no edema Flat under oil, fibrosis improved, scattered IRH/DBH, residual fibrosis and thickening nasal macula -- slightly improved   Vessels attenuated, Tortuous, peripheral sclerosis and fibrosis, sclerotic vessels temporal mac Severe attenuation / sclerosis, +fibrosis improved, Tortuous   Periphery Attached, scattered patches of fibrosis and NVE, small flap tear at 0130 equator -- no SRF, good laser surrounding, old VH settled inferiorly -- improved; PRP laser changes Attached, Scattered DBH; good PRP changes 360; scattered fibrosis -- improved           Refraction     Wearing Rx       Sphere Cylinder Axis Add   Right -2.25 +1.00 162 +2.25   Left -2.00 +0.75 012 +2.25           IMAGING AND PROCEDURES  Imaging and Procedures for 10/05/2023  OCT, Retina - OU - Both Eyes       Right Eye Quality was good. Central Foveal Thickness: 216. Progression has improved. Findings include normal foveal contour, no IRF, no SRF, intraretinal hyper-reflective material  (Mild interval improvement in vitreous opacities / VH, Persistent fibrosis of nasal disc; macula and fovea stable).   Left Eye Quality was good. Central Foveal Thickness: 350. Progression has improved. Findings include no SRF, abnormal foveal contour, intraretinal fluid, macular pucker, inner retinal atrophy, outer retinal atrophy (Retina attached under oil; diffuse atrophy; persistent cystic changes / edema nasal macula).   Notes *Images captured and stored on drive  Diagnosis / Impression:  OD: interval improvement in vitreous opacities / VH, Persistent fibrosis of nasal disc; macula and fovea stable OS: Retina attached under oil; diffuse atrophy; persistent cystic changes / edema nasal macula  Clinical management:  See below  Abbreviations: NFP - Normal foveal profile. CME - cystoid macular edema. PED - pigment epithelial detachment. IRF - intraretinal fluid. SRF - subretinal fluid. EZ - ellipsoid zone. ERM - epiretinal membrane. ORA - outer retinal atrophy. ORT - outer retinal tubulation. SRHM - subretinal hyper-reflective material. IRHM - intraretinal hyper-reflective material            ASSESSMENT/PLAN:    ICD-10-CM   1. Proliferative diabetic retinopathy of both eyes with macular edema associated with type 2 diabetes mellitus (HCC)  E11.3513 OCT, Retina -  OU - Both Eyes    2. Vitreous hemorrhage of right eye (HCC)  H43.11     3. Vitreous hemorrhage of left eye (HCC)  H43.12     4. Traction detachment of left retina  H33.42     5. Retinal tear of right eye  H33.311     6. Essential hypertension  I10     7. Hypertensive retinopathy of both eyes  H35.033     8. Combined forms of age-related cataract of right eye  H25.811     9. Pseudophakia  Z96.1      1-4. Proliferative diabetic retinopathy OU  - delayed f/u -- 11 months instead of 8 weeks (11.10.23-10.16.24)  - A1c was 7.3 on 09.04.24  - s/p PRP OD (09.19.22)  - s/p IVA OD #1 (12.23.22), #2 (01.27.23), #3  (02.24.23), #4 (03.24.23), #5 (04.21.23), #6 (05.24.23), #7 (08.04.23), #8 (11.09.23)  - s/p IVA OS #1 (01.27.23), #2 (02.24.23), #3 (03.24.23), #4 (04.21.23), #5 (05.24.23), #6 (08.04.23), #7 (11.09.23)             - OD: w/ +NVD  - OS: with chronic, diffuse VH and underlying TRD - exam shows +NVD and scattered fibrosis OD,              - s/p PPV/MP/EL/FAX/silicone oil (1000 cs) OS 10.20.22             - intra-op -- retina beneath chronic, dense VH was severely ischemic, florid fibrosis w/ +360 posterior TRD w/ stretch holes             - good oil fill, retina attached under oil; fibrosis improved, and good laser changes in place  - BCVA improved to 20/200 from CF OS             - IOP 24 OS  - OCT shows OD: interval improvement in vitreous opacities / VH, Persistent fibrosis of nasal disc; macula and fovea stable; OS: Retina attached under oil; diffuse atrophy; mild interval increase in IRF / edema nasal macula - pt using PF and Ketorolac once daily OS -- okay to stop  - drop instructions reviewed - Recommend holding injection OU today - pt in agreement - informed consent obtained and signed, 01.27.23 (OS) - f/u 2-3 months, w/DFE/OCT/FA, poss injection  2.VH OD - New floaters noticed on 12.23.22 exam - likely related to history of PDR and HTN  - s/p IVA OD #1 (12.23.22), #2 (01.27.23), #3 (02.24.23), #4 (03.24.23), #5 (04.21.23), #6 (05.24.23), #7 (08.04.23)  - today, stable improvement in VH and BCVA 20/20 OD - recommend holding injections today as above - IVA informed consent obtained and signed, 12.23.22 (OD) - f/u 2-3 mos - DFE/OCT/FA, possible injection  5. Retinal tear, OD - small flap tear located at 0130 equator, no SRF - lasered with PRP OD as above on 09.19.22  6,7. Hypertensive retinopathy OU - discussed importance of tight BP control - monitor    8. Mixed Cataract OD - The symptoms of cataract, surgical options, and treatments and risks were discussed with patient.  -  discussed diagnosis and progression - under the expert management of Dr. Fabian Sharp  9. Pseudophakia OS  - s/p CE/IOL (Dr. Dione Booze)  - IOL in good position, doing well  - monitor   Ophthalmic Meds Ordered this visit:  No orders of the defined types were placed in this encounter.    Return for f/u 2-3 months, PDR OU, DFE, OCT, FA.  There are no Patient  Instructions on file for this visit.  This document serves as a record of services personally performed by Karie Chimera, MD, PhD. It was created on their behalf by De Blanch, an ophthalmic technician. The creation of this record is the provider's dictation and/or activities during the visit.    Electronically signed by: De Blanch, OA, 10/08/23  1:40 AM  This document serves as a record of services personally performed by Karie Chimera, MD, PhD. It was created on their behalf by Glee Arvin. Manson Passey, OA an ophthalmic technician. The creation of this record is the provider's dictation and/or activities during the visit.    Electronically signed by: Glee Arvin. Manson Passey, OA 10/08/23 1:40 AM  Karie Chimera, M.D., Ph.D. Diseases & Surgery of the Retina and Vitreous [facility]  I have reviewed the above documentation for accuracy and completeness, and I agree with the above. Karie Chimera, M.D., Ph.D. 10/08/23 1:40 AM   Abbreviations: M myopia (nearsighted); A astigmatism; H hyperopia (farsighted); P presbyopia; Mrx spectacle prescription;  CTL contact lenses; OD right eye; OS left eye; OU both eyes  XT exotropia; ET esotropia; PEK punctate epithelial keratitis; PEE punctate epithelial erosions; DES dry eye syndrome; MGD meibomian gland dysfunction; ATs artificial tears; PFAT's preservative free artificial tears; NSC nuclear sclerotic cataract; PSC posterior subcapsular cataract; ERM epi-retinal membrane; PVD posterior vitreous detachment; RD retinal detachment; DM diabetes mellitus; DR diabetic retinopathy;  NPDR non-proliferative diabetic retinopathy; PDR proliferative diabetic retinopathy; CSME clinically significant macular edema; DME diabetic macular edema; dbh dot blot hemorrhages; CWS cotton wool spot; POAG primary open angle glaucoma; C/D cup-to-disc ratio; HVF humphrey visual field; GVF goldmann visual field; OCT optical coherence tomography; IOP intraocular pressure; BRVO Branch retinal vein occlusion; CRVO central retinal vein occlusion; CRAO central retinal artery occlusion; BRAO branch retinal artery occlusion; RT retinal tear; SB scleral buckle; PPV pars plana vitrectomy; VH Vitreous hemorrhage; PRP panretinal laser photocoagulation; IVK intravitreal kenalog; VMT vitreomacular traction; MH Macular hole;  NVD neovascularization of the disc; NVE neovascularization elsewhere; AREDS age related eye disease study; ARMD age related macular degeneration; POAG primary open angle glaucoma; EBMD epithelial/anterior basement membrane dystrophy; ACIOL anterior chamber intraocular lens; IOL intraocular lens; PCIOL posterior chamber intraocular lens; Phaco/IOL phacoemulsification with intraocular lens placement; PRK photorefractive keratectomy; LASIK laser assisted in situ keratomileusis; HTN hypertension; DM diabetes mellitus; COPD chronic obstructive pulmonary disease

## 2023-09-28 ENCOUNTER — Other Ambulatory Visit (HOSPITAL_COMMUNITY): Payer: Self-pay

## 2023-09-28 ENCOUNTER — Other Ambulatory Visit: Payer: Self-pay

## 2023-10-04 ENCOUNTER — Other Ambulatory Visit (HOSPITAL_COMMUNITY): Payer: Self-pay

## 2023-10-05 ENCOUNTER — Encounter (INDEPENDENT_AMBULATORY_CARE_PROVIDER_SITE_OTHER): Payer: Self-pay | Admitting: Ophthalmology

## 2023-10-05 ENCOUNTER — Ambulatory Visit (INDEPENDENT_AMBULATORY_CARE_PROVIDER_SITE_OTHER): Payer: Medicare Other | Admitting: Ophthalmology

## 2023-10-05 DIAGNOSIS — H3342 Traction detachment of retina, left eye: Secondary | ICD-10-CM | POA: Diagnosis not present

## 2023-10-05 DIAGNOSIS — E113513 Type 2 diabetes mellitus with proliferative diabetic retinopathy with macular edema, bilateral: Secondary | ICD-10-CM

## 2023-10-05 DIAGNOSIS — H4313 Vitreous hemorrhage, bilateral: Secondary | ICD-10-CM | POA: Diagnosis not present

## 2023-10-05 DIAGNOSIS — H33311 Horseshoe tear of retina without detachment, right eye: Secondary | ICD-10-CM

## 2023-10-05 DIAGNOSIS — I10 Essential (primary) hypertension: Secondary | ICD-10-CM | POA: Diagnosis not present

## 2023-10-05 DIAGNOSIS — H25811 Combined forms of age-related cataract, right eye: Secondary | ICD-10-CM

## 2023-10-05 DIAGNOSIS — H4312 Vitreous hemorrhage, left eye: Secondary | ICD-10-CM

## 2023-10-05 DIAGNOSIS — H4311 Vitreous hemorrhage, right eye: Secondary | ICD-10-CM

## 2023-10-05 DIAGNOSIS — Z961 Presence of intraocular lens: Secondary | ICD-10-CM

## 2023-10-05 DIAGNOSIS — H35033 Hypertensive retinopathy, bilateral: Secondary | ICD-10-CM | POA: Diagnosis not present

## 2023-10-08 ENCOUNTER — Encounter (INDEPENDENT_AMBULATORY_CARE_PROVIDER_SITE_OTHER): Payer: Self-pay | Admitting: Ophthalmology

## 2023-10-12 ENCOUNTER — Encounter: Payer: Self-pay | Admitting: Internal Medicine

## 2023-10-12 ENCOUNTER — Ambulatory Visit: Payer: Medicare Other | Attending: Internal Medicine | Admitting: Internal Medicine

## 2023-10-12 VITALS — BP 120/72 | HR 72 | Ht 68.0 in | Wt 254.2 lb

## 2023-10-12 DIAGNOSIS — I493 Ventricular premature depolarization: Secondary | ICD-10-CM | POA: Diagnosis not present

## 2023-10-12 NOTE — Progress Notes (Signed)
HPI Adrienne Lane returns today for followup. She is a pleasant 57 yo woman with a h/o PVC's, non-ischemic CM, chronic renal failure, in part due to stones, stroke on Pradaxa. She is diabetic. She had previously been on amiodarone but did not appear to have much improvement though this was tried in the hospital when she was quite ill. She has worn cardiac monitors in the past with PVC burden of 12-21%. She does not feel her palpitations. Her EF has improved on medical therapy from 20 to most recently 50%. No syncope. She had a stroke resulting in weakness which has improved remarkably. She requests that we cut back on her dose of mextil. Allergies  Allergen Reactions   Adhesive [Tape]     Tears skin, can tolerate paper tape     Current Outpatient Medications  Medication Sig Dispense Refill   acetaminophen (TYLENOL) 500 MG tablet Take 500 mg by mouth every 6 (six) hours as needed for moderate pain or headache.     amLODipine (NORVASC) 10 MG tablet Take 1 tablet (10 mg total) by mouth daily. 30 tablet 6   aspirin EC 81 MG tablet Take 81 mg by mouth daily. Swallow whole.     atorvastatin (LIPITOR) 80 MG tablet Take 1 tablet (80 mg total) by mouth at bedtime. 90 tablet 2   Bromfenac Sodium (PROLENSA) 0.07 % SOLN Place 1 drop into the left eye 4 (four) times daily. 3 mL 3   Calcium Fructoborate POWD Take 1 tablet by mouth daily.     carvedilol (COREG) 6.25 MG tablet Take 1 tablet (6.25 mg total) by mouth 2 (two) times daily with a meal. 180 tablet 3   empagliflozin (JARDIANCE) 10 MG TABS tablet Take 1 Tablet by mouth daily. 90 tablet 1   glucose blood (FREESTYLE TEST STRIPS) test strip Use 1 strip 3 (three) times daily 100 each 11   glucose blood (TRUE METRIX BLOOD GLUCOSE TEST) test strip Use to check fasting blood glucose 3 times daily 50 strip 6   hydrALAZINE (APRESOLINE) 100 MG tablet Take 1 tablet (100 mg total) by mouth 3 (three) times daily. 90 tablet 3   insulin aspart (NOVOLOG) 100  UNIT/ML injection Use as directed using sliding scale three times daily. (Patient taking differently: Inject 2-5 Units into the skin 3 (three) times daily with meals. Use as directed using sliding scale three times daily. 2 units to 5 units as needed) 10 mL PRN   Insulin Glargine Solostar (LANTUS) 100 UNIT/ML Solostar Pen Inject 22 Units into the skin daily. 18 mL 1   Insulin Pen Needle 31G X 5 MM MISC Inject 1 each into the skin daily. 100 each 3   Insulin Syringe-Needle U-100 30G X 5/16" 0.5 ML MISC Use as directed. 100 each 11   isosorbide mononitrate (IMDUR) 60 MG 24 hr tablet Take 1 tablet (60 mg total) by mouth daily. 30 tablet 3   ketorolac (ACULAR) 0.5 % ophthalmic solution Place 1 drop into the left eye 4 (four) times daily. 10 mL 3   L-LYSINE PO Take 1 tablet by mouth every morning.     Lancets (ONETOUCH ULTRASOFT) lancets Check CBG twice a day 60 each 5   Lysine HCl 1000 MG TABS Take by mouth.     mexiletine (MEXITIL) 150 MG capsule Take 2 capsules (300 mg total) by mouth every 12 (twelve) hours. 120 capsule 6   MILK THISTLE EXTRACT PO Take by mouth.     Multiple  Vitamins-Minerals (WOMENS MULTI PO) Take 1 tablet by mouth daily.     Multiple Vitamins-Minerals (ZINC PO) Take 1 capsule by mouth daily.     NIFEdipine (PROCARDIA XL/NIFEDICAL XL) 60 MG 24 hr tablet Take 1 tablet (60 mg total) by mouth daily. 90 tablet 3   oxyCODONE-acetaminophen (PERCOCET/ROXICET) 5-325 MG tablet Take 1-2 tablets by mouth every 6 (six) hours as needed for severe pain. 15 tablet 0   prednisoLONE acetate (PRED FORTE) 1 % ophthalmic suspension Place 1 drop into the left eye 4 (four) times daily. 15 mL 0   rivaroxaban (XARELTO) 20 MG TABS tablet Take 1 tablet (20 mg total) by mouth daily with supper. 30 tablet 11   sacubitril-valsartan (ENTRESTO) 24-26 MG Take 1 tablet by mouth 2 (two) times daily. 180 tablet 3   spironolactone (ALDACTONE) 25 MG tablet Take 25 mg by mouth daily.     TRUEplus Lancets 28G MISC Use  to check fasting blood glucose 3 times daily 100 each 3   Turmeric (QC TUMERIC COMPLEX PO) Take 1 capsule by mouth daily.     UNABLE TO FIND Med Name: Milk thistle. Patient takes 1 capsule by mouth in the evening.     Vitamin D-Vitamin K (VITAMIN K2-VITAMIN D3 PO) Take 1 tablet by mouth daily.     No current facility-administered medications for this visit.     Past Medical History:  Diagnosis Date   Atrial fibrillation with RVR (HCC)    Bigeminy 01/2020   Cataract    CHF (congestive heart failure) (HCC) 10/20/2019   CKD (chronic kidney disease), stage IV (HCC)    Diabetes mellitus without complication (HCC)    Diabetic retinopathy (HCC)    DOE (dyspnea on exertion)    walking upstairs or up hill resolves in one minute   Dysrhythmia    a-fib - on pradaxa   Fibroids    History of kidney stones    History of recent blood transfusion 02/26/2020   Hypertension    Hypertensive retinopathy    Iron deficiency anemia    Non-ischemic cardiomyopathy (HCC)    tachycardia induced   Obese    Peripheral edema    Premature ventricular contractions (PVCs) (VPCs)    Sleep apnea    does not use cpap   Stroke (HCC) 12/2020   Umbilical hernia    Wears glasses     ROS:   All systems reviewed and negative except as noted in the HPI.   Past Surgical History:  Procedure Laterality Date   CATARACT EXTRACTION     CHOLECYSTECTOMY     CYSTOSCOPY W/ URETERAL STENT PLACEMENT Bilateral 05/24/2020   Procedure: CYSTOSCOPY WITH RETROGRADE PYELOGRAM/URETERAL STENT PLACEMENT;  Surgeon: Noel Christmas, MD;  Location: [facility];  Service: Urology;  Laterality: Bilateral;   CYSTOSCOPY/RETROGRADE/URETEROSCOPY Bilateral 04/03/2020   Procedure: CYSTOSCOPY/RETROGRADE/URETEROSCOPY;  Surgeon: Crist Fat, MD;  Location: [facility];  Service: Urology;  Laterality: Bilateral;   EYE SURGERY Left 08/2021   cataract removed   HYSTERECTOMY ABDOMINAL WITH SALPINGO-OOPHORECTOMY  07/21/2020   Procedure:  HYSTERECTOMY ABDOMINAL WITH BILATERAL SALPINGO-OOPHORECTOMY;  Surgeon: Essie Hart, MD;  Location: [facility] OR;  Service: Gynecology;;   INJECTION OF SILICONE OIL Left 10/08/2021   Procedure: INJECTION OF SILICONE OIL;  Surgeon: Rennis Chris, MD;  Location: [facility] OR;  Service: Ophthalmology;  Laterality: Left;   IR FLUORO GUIDE CV LINE RIGHT  02/21/2020   IR US GUIDE VASC ACCESS RIGHT  02/21/2020   MEMBRANE PEEL Left 10/08/2021   Procedure: MEMBRANE  PEEL;  Surgeon: Rennis Chris, MD;  Location: [facility] OR;  Service: Ophthalmology;  Laterality: Left;   NEPHROLITHOTOMY Left 02/14/2020   Procedure: NEPHROLITHOTOMY PERCUTANEOUS/ SURGEON ACCESS/ LEFT PERCUTANEOUS NEPHROSTOMY TUBE PLACEMENT;  Surgeon: Crist Fat, MD;  Location: [facility];  Service: Urology;  Laterality: Left;   NEPHROLITHOTOMY Left 02/21/2020   Procedure: NEPHROLITHOTOMY PERCUTANEOUS SECOND LOOK;  Surgeon: Crist Fat, MD;  Location: [facility];  Service: Urology;  Laterality: Left;   NEPHROLITHOTOMY Left 02/26/2020   Procedure: NEPHROLITHOTOMY PERCUTANEOUS;  Surgeon: Rene Paci, MD;  Location: [facility];  Service: Urology;  Laterality: Left;  NEED 150 MIN   NEPHROLITHOTOMY Right 03/24/2020   Procedure: NEPHROLITHOTOMY PERCUTANEOUS WITH ACCESS LEFT STENT REMOVAL;  Surgeon: Crist Fat, MD;  Location: [facility];  Service: Urology;  Laterality: Right;   PARS PLANA VITRECTOMY Left 10/08/2021   Procedure: PARS PLANA VITRECTOMY WITH 25 GAUGE;  Surgeon: Rennis Chris, MD;  Location: [facility] OR;  Service: Ophthalmology;  Laterality: Left;   PHOTOCOAGULATION WITH LASER Left 10/08/2021   Procedure: PHOTOCOAGULATION WITH LASER;  Surgeon: Rennis Chris, MD;  Location: [facility] OR;  Service: Ophthalmology;  Laterality: Left;   RIGHT HEART CATH N/A 11/09/2019   Procedure: RIGHT HEART CATH;  Surgeon: Dolores Patty, MD;  Location: [facility];  Service: Cardiovascular;  Laterality: N/A;   WISDOM TOOTH EXTRACTION       Family  History  Problem Relation Age of Onset   Atrial fibrillation Mother    Hypertension Mother    Stroke Mother 16   Diabetes Mother    Diabetes Father    Hypertension Father    Diabetes Sister    Hypertension Sister    Diabetes Brother    Hypertension Brother    Diabetes Brother    Heart attack Brother    Stroke Maternal Grandmother 42     Social History   Socioeconomic History   Marital status: Married    Spouse name: Not on file   Number of children: 0   Years of education: Not on file   Highest education level: Associate degree: occupational, Scientist, product/process development, or vocational program  Occupational History   Occupation: unemployed  Tobacco Use   Smoking status: Never   Smokeless tobacco: Never  Vaping Use   Vaping status: Never Used  Substance and Sexual Activity   Alcohol use: Never   Drug use: Never   Sexual activity: Yes    Birth control/protection: Surgical    Comment: Hysterectomy  Other Topics Concern   Not on file  Social History Narrative   Not on file   Social Determinants of Health   Financial Resource Strain: Not on File (04/08/2022)   Received from [employer], [employer]    Financial Resource Strain: 0  Food Insecurity: Not on File (09/15/2023)   Received from [facility] Insecurity    Food: 0  Transportation Needs: No Transportation Needs (08/09/2022)   PRAPARE - Administrator, Civil Service (Medical): No    Lack of Transportation (Non-Medical): No  Physical Activity: Not on File (04/08/2022)   Received from [place], [place]   Physical Activity    Physical Activity: 0  Stress: Not on File (04/08/2022)   Received from [facility], [place]   Stress    Stress: 0  Social Connections: Not on File (09/02/2023)   Received from [facility]   Social Connections    Connectedness: 0  Intimate Partner Violence: Unknown (03/25/2022)   Received from [facility]   HITS    Physically Hurt: Not on file    Insult or Talk Down To: Not on  file    Threaten Physical Harm: Not on file    Scream or Curse: Not on file     LMP 05/12/2020 (Exact Date)   Physical Exam:  obese appearing middle aged woman, NAD HEENT: Unremarkable Neck:  No JVD, no thyromegally Lymphatics:  No adenopathy Back:  No CVA tenderness Lungs:  Clear with no wheezes HEART:  IRegular rate rhythm, no murmurs, no rubs, no clicks Abd:  soft, positive bowel sounds, no organomegally, no rebound, no guarding Ext:  2 plus pulses, no edema, no cyanosis, no clubbing Skin:  No rashes no nodules Neuro:  CN II through XII intact, motor grossly intact   Assess/Plan: 1. Frequent PVC's - she is asymptomatic and her EF is improved. Her PVC's are left sided probably around the LV OT, possibly in the root. She will decrease her dose of mexitil. I asked her to call us if she has worsening symptoms.  2. Chronic systolic heart failure - her symptoms are class 2A and she feels better. Continue medical therapy under the direction of Dr. Dorthea [name].  3. HTN - her bp is minimally elevated.  4. Obesity - she is encouraged to lose weight.   Adrienne Gowda Adebayo Ensminger,MD

## 2023-10-12 NOTE — Patient Instructions (Addendum)
Medication Instructions:  Your physician has recommended you make the following change in your medication:  Decrease mexiletine to 2 tablets by mouth in the morning and 1 tablet by mouth in the evening.  Lab Work: None ordered.  If you have labs (blood work) drawn today and your tests are completely normal, you will receive your results only by: MyChart Message (if you have MyChart) OR A paper copy in the mail If you have any lab test that is abnormal or we need to change your treatment, we will call you to review the results.  Testing/Procedures: None ordered.  Follow-Up: At [facility], you and your health needs are our priority.  As part of our continuing mission to provide you with exceptional heart care, we have created designated Provider Care Teams.  These Care Teams include your primary Cardiologist (physician) and Advanced Practice Providers (APPs -  Physician Assistants and Nurse Practitioners) who all work together to provide you with the care you need, when you need it.   Your next appointment:   1 year(s)  The format for your next appointment:   In Person  Provider:   Lewayne Bunting, MD{or one of the following Advanced Practice Providers on your designated Care Team:   Francis Dowse, [place] Casimiro Needle "Mardelle Matte" [place], [place] Earnest Rosier, NP  Remote monitoring is used to monitor your Pacemaker/ ICD from home. This monitoring reduces the number of office visits required to check your device to one time per year. It allows [place] to keep an eye on the functioning of your device to ensure it is working properly.  Important Information About Sugar

## 2023-10-14 ENCOUNTER — Ambulatory Visit: Payer: Medicare Other | Admitting: Podiatry

## 2023-10-31 ENCOUNTER — Ambulatory Visit: Payer: Medicare Other | Admitting: Podiatry

## 2023-11-02 ENCOUNTER — Other Ambulatory Visit (HOSPITAL_COMMUNITY): Payer: Self-pay | Admitting: Internal Medicine

## 2023-11-03 ENCOUNTER — Other Ambulatory Visit: Payer: Self-pay

## 2023-11-03 ENCOUNTER — Other Ambulatory Visit (HOSPITAL_COMMUNITY): Payer: Self-pay

## 2023-11-03 MED ORDER — HYDRALAZINE HCL 100 MG PO TABS
100.0000 mg | ORAL_TABLET | Freq: Three times a day (TID) | ORAL | 3 refills | Status: DC
  Filled 2023-11-03: qty 90, 30d supply, fill #0
  Filled 2024-01-20: qty 90, 30d supply, fill #1
  Filled 2024-03-23: qty 90, 30d supply, fill #2
  Filled 2024-05-24: qty 90, 30d supply, fill #3

## 2023-11-22 ENCOUNTER — Other Ambulatory Visit (HOSPITAL_COMMUNITY): Payer: Self-pay

## 2023-11-25 ENCOUNTER — Other Ambulatory Visit (HOSPITAL_COMMUNITY): Payer: Self-pay

## 2023-11-25 MED ORDER — JARDIANCE 10 MG PO TABS
10.0000 mg | ORAL_TABLET | Freq: Every day | ORAL | 1 refills | Status: DC
  Filled 2023-11-25 – 2024-02-04 (×3): qty 90, 90d supply, fill #0

## 2023-11-25 MED ORDER — DICLOFENAC SODIUM 3 % EX GEL
1.0000 | Freq: Two times a day (BID) | CUTANEOUS | 1 refills | Status: DC
  Filled 2023-11-25: qty 100, 30d supply, fill #0

## 2023-11-25 MED ORDER — INSULIN GLARGINE SOLOSTAR 100 UNIT/ML ~~LOC~~ SOPN
22.0000 [IU] | PEN_INJECTOR | Freq: Every day | SUBCUTANEOUS | 1 refills | Status: DC
  Filled 2023-11-25 – 2024-02-04 (×2): qty 18, 81d supply, fill #0

## 2023-12-02 ENCOUNTER — Ambulatory Visit (INDEPENDENT_AMBULATORY_CARE_PROVIDER_SITE_OTHER): Payer: Medicare Other | Admitting: Family Medicine

## 2023-12-02 ENCOUNTER — Other Ambulatory Visit: Payer: Self-pay

## 2023-12-02 VITALS — BP 138/66 | Ht 68.0 in | Wt 255.0 lb

## 2023-12-02 DIAGNOSIS — M25561 Pain in right knee: Secondary | ICD-10-CM | POA: Diagnosis not present

## 2023-12-02 NOTE — Patient Instructions (Signed)
You have significant osteoarthritis in both of your knees. We know the left knee has issues as I have reviewed the CT and MRI done previously. Today we performed an [place] on the right knee which shows chronic meniscal issues (the shock absorbers of the knee), some osteophytes (which indicate osteoarthritis) and some joint fluid with debris floating inside, The fluid indicates an unhappy joint and the debris indicates it has been fairly long standing.  It is unclear to me why the compression socks would have "caused" this, but certainly sounds temporally related.  We discussed options including aspiration/injection with corticosteroid, oral anti-inflammatories and watchful waiting which is the most conservative management.  Should you right knee get worse, please call and make an appointment to see one of [place] back in clinic. It was great to meet you! Have a Happy Holiday!

## 2023-12-03 NOTE — Progress Notes (Signed)
  Adrienne Lane - 57 y.o. female MRN [id number]  Date of birth: 1966-09-11    SUBJECTIVE:      Chief Complaint:/ HPI:    Right knee pain times several days.  Started after she wore some compression stockings on her lower legs.  They were knee-high compression hose.  When she took them off she noticed that her right knee was hurting quite a bit.  Is gotten some better since then.  She has been taking over-the-counter Arnica which seems to help as well as a vitamin complex.  History of severe arthritis in the left knee but has not really had problems with the right knee before.  Seems like it is stiff and a little swollen has not been hot, she has had no locking or giving way.   OBJECTIVE: BP 138/66   Ht 5\' 8"  (1.727 m)   Wt 255 lb (115.7 kg)   LMP 05/12/2020 (Exact Date)   BMI 38.77 kg/m   Physical Exam:  Vital signs are reviewed. GENERAL: Well-developed, no acute distress KNEES: Right knee: Full extension and flexion although she has some stiffness with the last 10 to 15 degrees of extension.  Ligamentously intact to varus and valgus stress.  Mild tenderness to palpation over the anterior and medial joint line.  Popliteal space is soft.  Calf is soft. ULTRASOUND: Knee, right.  Significant amount of fluid in the suprapatellar pouch with some debris floating there.  The quadricep and patellar tendon are intact without any sign of defect.  She has osteophytes at both the medial and lateral joint line.  The medial meniscus is slightly protruded and indistinct.  The right lateral meniscus is also indistinct but not protruded.  ASSESSMENT & PLAN: Right knee pain: Given the extent of tricompartmental arthritis in her left knee, I suspect the right has some significant arthritis as well.  I cannot figure out what would have happened with the compression socks, and even though they are temporally related, I suspect it was some other over work of that leg rather than the compression socks.  She is  improving.  We discussed options.  She has a small but significant amount of fluid but I do not think it would be beneficial to aspirate that.  I offered corticosteroid injection which she declined.  Should she not improve with continued conservative management, she will return to clinic. See problem based charting & AVS for pt instructions. No problem-specific Assessment & Plan notes found for this encounter.

## 2023-12-09 ENCOUNTER — Other Ambulatory Visit (HOSPITAL_COMMUNITY): Payer: Self-pay

## 2023-12-09 ENCOUNTER — Other Ambulatory Visit: Payer: Self-pay

## 2023-12-26 ENCOUNTER — Other Ambulatory Visit (HOSPITAL_COMMUNITY): Payer: Self-pay

## 2024-01-02 NOTE — Progress Notes (Addendum)
 [facility] - Clinic Note  01/04/2024     CHIEF COMPLAINT Patient presents for Retina Follow Up  HISTORY OF PRESENT ILLNESS: Adrienne Lane is a 58 y.o. female who presents to the clinic today for:  HPI     Retina Follow Up   Patient presents with  Diabetic Retinopathy.  In both eyes.  This started 3 months ago.  Duration of 3 months.  Since onset it is stable.  I, the attending physician,  performed the HPI with the patient and updated documentation appropriately.        Comments   3 month retina follow up PDR OU pt is reporting no vision changes noticed she denies any flashes or floaters her last reading was140 A1C 7.3       Last edited by Ronelle Coffee, MD on 01/08/2024 12:14 AM.     Patient feels the vision is the same.   Referring physician: Sidra Dredge, MD [address] [place],  [place] [address]  HISTORICAL INFORMATION:   Selected notes from the MEDICAL RECORD NUMBER Referred by Dr. Diedre Fox for concern of VH   CURRENT MEDICATIONS: Current Outpatient Medications (Ophthalmic Drugs)  Medication Sig   dorzolamide -timolol  (COSOPT ) 2-0.5 % ophthalmic solution Place 1 drop into the left eye 2 (two) times daily.   Bromfenac  Sodium (PROLENSA ) 0.07 % SOLN Place 1 drop into the left eye 4 (four) times daily.   prednisoLONE  acetate (PRED FORTE ) 1 % ophthalmic suspension Place 1 drop into the left eye 4 (four) times daily.   No current facility-administered medications for this visit. (Ophthalmic Drugs)   Current Outpatient Medications (Other)  Medication Sig   acetaminophen  (TYLENOL ) 500 MG tablet Take 500 mg by mouth every 6 (six) hours as needed for moderate pain or headache.   amLODipine  (NORVASC ) 10 MG tablet Take 1 tablet (10 mg total) by mouth daily.   aspirin  EC 81 MG tablet Take 81 mg by mouth daily. Swallow whole.   atorvastatin  (LIPITOR ) 80 MG tablet Take 1 tablet (80 mg total) by mouth at bedtime.   Calcium  Fructoborate POWD  Take 1 tablet by mouth daily.   carvedilol  (COREG ) 6.25 MG tablet Take 1 tablet (6.25 mg total) by mouth 2 (two) times daily with a meal.   Diclofenac  Sodium 3 % GEL Apply topically 2 (two) times daily   empagliflozin  (JARDIANCE ) 10 MG TABS tablet Take 1 Tablet by mouth daily.   empagliflozin  (JARDIANCE ) 10 MG TABS tablet Take 1 tablet (10 mg total) by mouth daily.   glucose blood (FREESTYLE TEST STRIPS) test strip Use 1 strip 3 (three) times daily   glucose blood (TRUE METRIX BLOOD GLUCOSE TEST) test strip Use to check fasting blood glucose 3 times daily   hydrALAZINE  (APRESOLINE ) 100 MG tablet Take 1 tablet (100 mg total) by mouth 3 (three) times daily.   insulin  aspart (NOVOLOG ) 100 UNIT/ML injection Use as directed using sliding scale three times daily. (Patient taking differently: Inject 2-5 Units into the skin 3 (three) times daily with meals. Use as directed using sliding scale three times daily. 2 units to 5 units as needed)   Insulin  Glargine Solostar (LANTUS ) 100 UNIT/ML Solostar Pen Inject 22 Units into the skin daily.   Insulin  Pen Needle 31G X 5 MM MISC Inject 1 each into the skin daily.   Insulin  Syringe-Needle U-100 30G X 5/16" 0.5 ML MISC Use as directed.   isosorbide  mononitrate (IMDUR ) 60 MG 24 hr tablet Take  1 tablet (60 mg total) by mouth daily.   L-LYSINE PO Take 1 tablet by mouth every morning.   Lancets (ONETOUCH ULTRASOFT) lancets Check CBG twice a day   Lysine HCl 1000 MG TABS Take by mouth.   mexiletine (MEXITIL ) 150 MG capsule Take 2 capsules (300 mg total) by mouth every 12 (twelve) hours.   MILK THISTLE EXTRACT PO Take by mouth.   Multiple Vitamins-Minerals (WOMENS MULTI PO) Take 1 tablet by mouth daily.   Multiple Vitamins-Minerals (ZINC PO) Take 1 capsule by mouth daily.   NIFEdipine  (PROCARDIA  XL/NIFEDICAL XL) 60 MG 24 hr tablet Take 1 tablet (60 mg total) by mouth daily.   oxyCODONE -acetaminophen  (PERCOCET/ROXICET) 5-325 MG tablet Take 1-2 tablets by mouth every  6 (six) hours as needed for severe pain.   rivaroxaban  (XARELTO ) 20 MG TABS tablet Take 1 tablet (20 mg total) by mouth daily with supper.   sacubitril -valsartan  (ENTRESTO ) 24-26 MG Take 1 tablet by mouth 2 (two) times daily.   spironolactone  (ALDACTONE ) 25 MG tablet Take 25 mg by mouth daily.   TRUEplus Lancets 28G MISC Use to check fasting blood glucose 3 times daily   Turmeric (QC TUMERIC COMPLEX PO) Take 1 capsule by mouth daily.   UNABLE TO FIND Med Name: Milk thistle. Patient takes 1 capsule by mouth in the evening.   Vitamin D -Vitamin K (VITAMIN K2-VITAMIN D3 PO) Take 1 tablet by mouth daily.   No current facility-administered medications for this visit. (Other)   REVIEW OF SYSTEMS: ROS   Positive for: Genitourinary, Endocrine, Cardiovascular, Eyes, Respiratory Negative for: Constitutional, Gastrointestinal, Neurological, Skin, Musculoskeletal, HENT, Psychiatric, Allergic/Imm, Heme/Lymph Last edited by Alise Appl, COT on 01/04/2024  9:33 AM.        ALLERGIES Allergies  Allergen Reactions   Adhesive [Tape]     Tears skin, can tolerate paper tape   PAST MEDICAL HISTORY Past Medical History:  Diagnosis Date   Atrial fibrillation with RVR (HCC)    Bigeminy 01/2020   Cataract    CHF (congestive heart failure) (HCC) 10/20/2019   CKD (chronic kidney disease), stage IV (HCC)    Diabetes mellitus without complication (HCC)    Diabetic retinopathy (HCC)    DOE (dyspnea on exertion)    walking upstairs or up hill resolves in one minute   Dysrhythmia    a-fib - on pradaxa    Fibroids    History of kidney stones    History of recent blood transfusion 02/26/2020   Hypertension    Hypertensive retinopathy    Iron deficiency anemia    Non-ischemic cardiomyopathy (HCC)    tachycardia induced   Obese    Peripheral edema    Premature ventricular contractions (PVCs) (VPCs)    Sleep apnea    does not use cpap   Stroke (HCC) 12/2020   Umbilical hernia    Wears  glasses    Past Surgical History:  Procedure Laterality Date   CATARACT EXTRACTION     CHOLECYSTECTOMY     CYSTOSCOPY W/ URETERAL STENT PLACEMENT Bilateral 05/24/2020   Procedure: CYSTOSCOPY WITH RETROGRADE PYELOGRAM/URETERAL STENT PLACEMENT;  Surgeon: Roxane Copp, MD;  Location: [facility];  Service: Urology;  Laterality: Bilateral;   CYSTOSCOPY/RETROGRADE/URETEROSCOPY Bilateral 04/03/2020   Procedure: CYSTOSCOPY/RETROGRADE/URETEROSCOPY;  Surgeon: Andrez Banker, MD;  Location: [facility];  Service: Urology;  Laterality: Bilateral;   EYE SURGERY Left 08/2021   cataract removed   HYSTERECTOMY ABDOMINAL WITH SALPINGO-OOPHORECTOMY  07/21/2020   Procedure: HYSTERECTOMY ABDOMINAL WITH BILATERAL SALPINGO-OOPHORECTOMY;  Surgeon: Matthias Sor,  Nivia Basta, MD;  Location: [facility] OR;  Service: Gynecology;;   INJECTION OF SILICONE OIL Left 10/08/2021   Procedure: INJECTION OF SILICONE OIL;  Surgeon: Ronelle Coffee, MD;  Location: [facility] OR;  Service: Ophthalmology;  Laterality: Left;   IR FLUORO GUIDE CV LINE RIGHT  02/21/2020   IR US  GUIDE VASC ACCESS RIGHT  02/21/2020   MEMBRANE PEEL Left 10/08/2021   Procedure: MEMBRANE PEEL;  Surgeon: Ronelle Coffee, MD;  Location: [facility] OR;  Service: Ophthalmology;  Laterality: Left;   NEPHROLITHOTOMY Left 02/14/2020   Procedure: NEPHROLITHOTOMY PERCUTANEOUS/ SURGEON ACCESS/ LEFT PERCUTANEOUS NEPHROSTOMY TUBE PLACEMENT;  Surgeon: Andrez Banker, MD;  Location: [facility];  Service: Urology;  Laterality: Left;   NEPHROLITHOTOMY Left 02/21/2020   Procedure: NEPHROLITHOTOMY PERCUTANEOUS SECOND LOOK;  Surgeon: Andrez Banker, MD;  Location: [facility];  Service: Urology;  Laterality: Left;   NEPHROLITHOTOMY Left 02/26/2020   Procedure: NEPHROLITHOTOMY PERCUTANEOUS;  Surgeon: Adelbert Homans, MD;  Location: [facility];  Service: Urology;  Laterality: Left;  NEED 150 MIN   NEPHROLITHOTOMY Right 03/24/2020   Procedure: NEPHROLITHOTOMY PERCUTANEOUS WITH ACCESS LEFT STENT REMOVAL;   Surgeon: Andrez Banker, MD;  Location: [facility];  Service: Urology;  Laterality: Right;   PARS PLANA VITRECTOMY Left 10/08/2021   Procedure: PARS PLANA VITRECTOMY WITH 25 GAUGE;  Surgeon: Ronelle Coffee, MD;  Location: [facility] OR;  Service: Ophthalmology;  Laterality: Left;   PHOTOCOAGULATION WITH LASER Left 10/08/2021   Procedure: PHOTOCOAGULATION WITH LASER;  Surgeon: Ronelle Coffee, MD;  Location: [facility] OR;  Service: Ophthalmology;  Laterality: Left;   RIGHT HEART CATH N/A 11/09/2019   Procedure: RIGHT HEART CATH;  Surgeon: Mardell Shade, MD;  Location: [facility];  Service: Cardiovascular;  Laterality: N/A;   WISDOM TOOTH EXTRACTION     FAMILY HISTORY Family History  Problem Relation Age of Onset   Atrial fibrillation Mother    Hypertension Mother    Stroke Mother 55   Diabetes Mother    Diabetes Father    Hypertension Father    Diabetes Sister    Hypertension Sister    Diabetes Brother    Hypertension Brother    Diabetes Brother    Heart attack Brother    Stroke Maternal Grandmother 25   SOCIAL HISTORY Social History   Tobacco Use   Smoking status: Never   Smokeless tobacco: Never  Vaping Use   Vaping status: Never Used  Substance Use Topics   Alcohol use: Never   Drug use: Never       OPHTHALMIC EXAM:  Base Eye Exam     Visual Acuity (Snellen - Linear)       Right Left   Dist [place] 20/40 CF at 3'   Dist ph [place] 20/20 -2 20/300 -1    Correction: Glasses         Tonometry (Tonopen, 9:39 AM)       Right Left   Pressure 17 31  1  gtts dorz/brom OS         Pupils       Dark Light Shape React APD   Right 3 2 Round Brisk None   Left 4 4 Round NR          Visual Fields       Left Right     Full   Restrictions Partial outer superior temporal, inferior temporal, superior nasal, inferior nasal deficiencies          Extraocular Movement       Right  Left    Full, Ortho Full, Ortho         Neuro/Psych     Oriented x3: Yes    Mood/Affect: Normal         Dilation     Both eyes: 2.5% Phenylephrine  @ 9:41 AM           Slit Lamp and Fundus Exam     External Exam       Right Left   External Normal Normal         Slit Lamp Exam       Right Left   Lids/Lashes Dermatochalasis - upper lid Dermatochalasis - upper lid   Conjunctiva/Sclera mild melanosis melanosis   Cornea mild arcus, trace PEE mild arcus, well healed cataract wound, tr. Debris in tear film   Anterior Chamber Deep and clear deep and clear   Iris Round and dilated, No NVI No NVI, Round and moderately dilated   Lens 2+ Nuclear sclerosis, 2+ Cortical cataract PC IOL in good position, 1-2+PCO   Anterior Vitreous Vitreous syneresis, scattered white blood stained vitreous condensations, fibrosis post vitrectomy, good oil fill, +pigment         Fundus Exam       Right Left   Disc Pink and Sharp, Compact, mild fibrosis nasal, mild PPP temporally 1+pallor, sharp nasal rim, +temporal edema - improved   C/D Ratio 0.5 0.3   Macula Good foveal reflex, rare MA, no edema Flat under oil, fibrosis improved, scattered IRH/DBH, residual fibrosis and thickening nasal macula -- slightly improved   Vessels attenuated, Tortuous, peripheral sclerosis and fibrosis, sclerotic vessels temporal mac Severe attenuation / sclerosis, +fibrosis improved, Tortuous   Periphery Attached, scattered patches of fibrosis and NVE, small flap tear at 0130 equator -- no SRF, good laser surrounding, old VH settled inferiorly -- improved; good 360 PRP laser Attached, Scattered DBH; good 360 PRP; scattered fibrosis -- improved           Refraction     Wearing Rx       Sphere Cylinder Axis Add   Right -2.25 +1.00 162 +2.25   Left -2.00 +0.75 012 +2.25           IMAGING AND PROCEDURES  Imaging and Procedures for 01/04/2024  OCT, Retina - OU - Both Eyes       Right Eye Quality was good. Central Foveal Thickness: 216. Progression has been stable. Findings  include normal foveal contour, no IRF, no SRF, intraretinal hyper-reflective material (Stable improvement in vitreous opacities / VH, Persistent fibrosis of nasal disc; macula and fovea stable).   Left Eye Quality was good. Central Foveal Thickness: 336. Progression has improved. Findings include no SRF, abnormal foveal contour, intraretinal fluid, macular pucker, inner retinal atrophy, outer retinal atrophy (Retina attached under oil; diffuse atrophy; persistent cystic changes / edema nasal macula-- slightly improved).   Notes *Images captured and stored on drive  Diagnosis / Impression:  OD: stable improvement in vitreous opacities / VH, Persistent fibrosis of nasal disc; macula and fovea stable OS: Retina attached under oil; diffuse atrophy; persistent cystic changes / edema nasal macula- slightly improved  Clinical management:  See below  Abbreviations: NFP - Normal foveal profile. CME - cystoid macular edema. PED - pigment epithelial detachment. IRF - intraretinal fluid. SRF - subretinal fluid. EZ - ellipsoid zone. ERM - epiretinal membrane. ORA - outer retinal atrophy. ORT - outer retinal tubulation. SRHM - subretinal hyper-reflective material. IRHM - intraretinal hyper-reflective material  ASSESSMENT/PLAN:    ICD-10-CM   1. Proliferative diabetic retinopathy of both eyes with macular edema associated with type 2 diabetes mellitus (HCC)  E11.3513 OCT, Retina - OU - Both Eyes    2. Traction detachment of left retina  H33.42     3. Diabetes mellitus treated with oral medication (HCC)  E11.9    Z79.84     4. Encounter for long-term (current) use of insulin  (HCC)  Z79.4     5. Vitreous hemorrhage of right eye (HCC)  H43.11     6. Vitreous hemorrhage of left eye (HCC)  H43.12     7. Retinal tear of right eye  H33.311     8. Essential hypertension  I10     9. Hypertensive retinopathy of both eyes  H35.033     10. Combined forms of age-related cataract of right  eye  H25.811     11. Pseudophakia  Z96.1     12. 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 Proliferative diabetic retinopathy OU  - delayed f/u -- 11 months instead of 8 weeks (11.10.23-10.16.24)  - A1c was 7.3 (12.06.24), 7.3 (09.04.24)  - s/p PRP OD (09.19.22) - s/p IVA OD #1 (12.23.22), #2 (01.27.23), #3 (02.24.23), #4 (03.24.23), #5 (04.21.23), #6 (05.24.23), #7 (08.04.23), #8 (11.09.23) - s/p IVA OS #1 (01.27.23), #2 (02.24.23), #3 (03.24.23), #4 (04.21.23), #5 (05.24.23), #6 (08.04.23), #7 (11.09.23)             - OD: w/ +NVD  - OS: with chronic, diffuse VH and underlying TRD - exam shows +NVD and scattered fibrosis OD,              - s/p PPV/MP/EL/FAX/silicone oil (1000 cs) OS 10.20.22 for VH and TRD  - intra-op -- retina beneath chronic, dense VH was severely ischemic, florid fibrosis w/ +360 posterior TRD w/ stretch holes   - good oil fill, retina attached under oil; fibrosis improved, and good laser changes in place  - BCVA OS 20/300 from CF              - IOP OD 17, OS 31 - patient instructed to begin Cosopt  OS BID - rx sent to pharmacy on file - OCT shows OD: stable improvement in vitreous opacities / VH, Persistent fibrosis of nasal disc; macula and fovea stable; OS: Retina attached under oil; diffuse atrophy; persistent cystic changes / edema nasal macula- slightly improved - pt using PF and Ketorolac  once daily OS -- okay to stop  - drop instructions reviewed - Recommend holding injection again OU today - pt in agreement - FA attempted but unable to obtain venous access -- will try again next time (pt to hydrate better) - f/u 6 months, w/DFE/OCT/FA, poss injection  5,6.VH OU - New floaters noticed on 12.23.22 exam - likely related to history of PDR and HTN - s/p IVA OD #1 (12.23.22), #2 (01.27.23), #3 (02.24.23), #4 (03.24.23), #5 (04.21.23), #6 (05.24.23), #7 (08.04.23)  - today, stable improvement in VH and BCVA 20/20 OD - recommend holding injections today  as above - IVA informed consent obtained and signed, 12.23.22 (OD) - f/u 6 mos - DFE/OCT/FA, possible injection  7. Retinal tear, OD - small flap tear located at 0130 equator, no SRF - lasered with PRP OD as above on 09.19.22  8,9. Hypertensive retinopathy OU - discussed importance of tight BP control - monitor    10. Mixed Cataract OD - The symptoms of cataract, surgical  options, and treatments and risks were discussed with patient.  - discussed diagnosis and progression - under the expert management of Dr. Diedre Fox  11. Pseudophakia OS  - s/p CE/IOL (Dr. Candi Chafe)  - IOL in good position, doing well  - monitor   12. Ocular hypertension OS  - IOP OS 31 today  - recommend Cosopt  BID OS  Ophthalmic Meds Ordered this visit:  Meds ordered this encounter  Medications   dorzolamide -timolol  (COSOPT ) 2-0.5 % ophthalmic solution    Sig: Place 1 drop into the left eye 2 (two) times daily.    Dispense:  10 mL    Refill:  3     Return in about 6 months (around 07/03/2024) for f/u PDR OU, DFE, OCT, FA - remind patient to drink a bottle of water  before coming into the office.  There are no Patient Instructions on file for this visit.  This document serves as a record of services personally performed by Jeanice Millard, MD, PhD. It was created on their behalf by Morley Arabia. Bevin Bucks, OA an ophthalmic technician. The creation of this record is the provider's dictation and/or activities during the visit.    Electronically signed by: Morley Arabia. Bevin Bucks, OA 01/08/24 12:22 AM  This document serves as a record of services personally performed by Jeanice Millard, MD, PhD. It was created on their behalf by Olene Berne, COT an ophthalmic technician. The creation of this record is the provider's dictation and/or activities during the visit.    Electronically signed by:  Olene Berne, COT  01/08/24 12:22 AM  Jeanice Millard, M.D., Ph.D. Diseases & Surgery of the Retina and  Vitreous [facility]  I have reviewed the above documentation for accuracy and completeness, and I agree with the above. Jeanice Millard, M.D., Ph.D. 01/08/24 12:22 AM  Abbreviations: M myopia (nearsighted); A astigmatism; H hyperopia (farsighted); P presbyopia; Mrx spectacle prescription;  CTL contact lenses; OD right eye; OS left eye; OU both eyes  XT exotropia; ET esotropia; PEK punctate epithelial keratitis; PEE punctate epithelial erosions; DES dry eye syndrome; MGD meibomian gland dysfunction; ATs artificial tears; PFAT's preservative free artificial tears; NSC nuclear sclerotic cataract; PSC posterior subcapsular cataract; ERM epi-retinal membrane; PVD posterior vitreous detachment; RD retinal detachment; DM diabetes mellitus; DR diabetic retinopathy; NPDR non-proliferative diabetic retinopathy; PDR proliferative diabetic retinopathy; CSME clinically significant macular edema; DME diabetic macular edema; dbh dot blot hemorrhages; CWS cotton wool spot; POAG primary open angle glaucoma; C/D cup-to-disc ratio; HVF humphrey visual field; GVF goldmann visual field; OCT optical coherence tomography; IOP intraocular pressure; BRVO Branch retinal vein occlusion; CRVO central retinal vein occlusion; CRAO central retinal artery occlusion; BRAO branch retinal artery occlusion; RT retinal tear; SB scleral buckle; PPV pars plana vitrectomy; VH Vitreous hemorrhage; PRP panretinal laser photocoagulation; IVK intravitreal kenalog ; VMT vitreomacular traction; MH Macular hole;  NVD neovascularization of the disc; NVE neovascularization elsewhere; AREDS age related eye disease study; ARMD age related macular degeneration; POAG primary open angle glaucoma; EBMD epithelial/anterior basement membrane dystrophy; ACIOL anterior chamber intraocular lens; IOL intraocular lens; PCIOL posterior chamber intraocular lens; Phaco/IOL phacoemulsification with intraocular lens placement; PRK photorefractive  keratectomy; LASIK laser assisted in situ keratomileusis; HTN hypertension; DM diabetes mellitus; COPD chronic obstructive pulmonary disease

## 2024-01-04 ENCOUNTER — Ambulatory Visit (INDEPENDENT_AMBULATORY_CARE_PROVIDER_SITE_OTHER): Payer: Medicare Other | Admitting: Ophthalmology

## 2024-01-04 ENCOUNTER — Other Ambulatory Visit (HOSPITAL_COMMUNITY): Payer: Self-pay

## 2024-01-04 ENCOUNTER — Encounter (INDEPENDENT_AMBULATORY_CARE_PROVIDER_SITE_OTHER): Payer: Self-pay | Admitting: Ophthalmology

## 2024-01-04 VITALS — BP 170/100 | HR 64

## 2024-01-04 DIAGNOSIS — E113513 Type 2 diabetes mellitus with proliferative diabetic retinopathy with macular edema, bilateral: Secondary | ICD-10-CM | POA: Diagnosis not present

## 2024-01-04 DIAGNOSIS — Z7984 Long term (current) use of oral hypoglycemic drugs: Secondary | ICD-10-CM

## 2024-01-04 DIAGNOSIS — H4312 Vitreous hemorrhage, left eye: Secondary | ICD-10-CM

## 2024-01-04 DIAGNOSIS — Z794 Long term (current) use of insulin: Secondary | ICD-10-CM | POA: Diagnosis not present

## 2024-01-04 DIAGNOSIS — H4313 Vitreous hemorrhage, bilateral: Secondary | ICD-10-CM | POA: Diagnosis not present

## 2024-01-04 DIAGNOSIS — H35033 Hypertensive retinopathy, bilateral: Secondary | ICD-10-CM | POA: Diagnosis not present

## 2024-01-04 DIAGNOSIS — H40052 Ocular hypertension, left eye: Secondary | ICD-10-CM

## 2024-01-04 DIAGNOSIS — I10 Essential (primary) hypertension: Secondary | ICD-10-CM | POA: Diagnosis not present

## 2024-01-04 DIAGNOSIS — H3342 Traction detachment of retina, left eye: Secondary | ICD-10-CM | POA: Diagnosis not present

## 2024-01-04 DIAGNOSIS — E119 Type 2 diabetes mellitus without complications: Secondary | ICD-10-CM

## 2024-01-04 DIAGNOSIS — H33311 Horseshoe tear of retina without detachment, right eye: Secondary | ICD-10-CM

## 2024-01-04 DIAGNOSIS — H25811 Combined forms of age-related cataract, right eye: Secondary | ICD-10-CM

## 2024-01-04 DIAGNOSIS — Z961 Presence of intraocular lens: Secondary | ICD-10-CM

## 2024-01-04 DIAGNOSIS — H4311 Vitreous hemorrhage, right eye: Secondary | ICD-10-CM

## 2024-01-04 MED ORDER — DORZOLAMIDE HCL-TIMOLOL MAL 2-0.5 % OP SOLN
1.0000 [drp] | Freq: Two times a day (BID) | OPHTHALMIC | 3 refills | Status: DC
  Filled 2024-01-04: qty 10, 90d supply, fill #0

## 2024-01-06 ENCOUNTER — Ambulatory Visit: Payer: Medicare Other | Admitting: Internal Medicine

## 2024-01-08 ENCOUNTER — Encounter (INDEPENDENT_AMBULATORY_CARE_PROVIDER_SITE_OTHER): Payer: Self-pay | Admitting: Ophthalmology

## 2024-01-30 ENCOUNTER — Telehealth (HOSPITAL_COMMUNITY): Payer: Self-pay

## 2024-01-30 ENCOUNTER — Encounter: Payer: Self-pay | Admitting: Podiatry

## 2024-01-30 ENCOUNTER — Ambulatory Visit (INDEPENDENT_AMBULATORY_CARE_PROVIDER_SITE_OTHER): Payer: Medicare Other | Admitting: Podiatry

## 2024-01-30 ENCOUNTER — Other Ambulatory Visit (HOSPITAL_COMMUNITY): Payer: Self-pay

## 2024-01-30 DIAGNOSIS — M79675 Pain in left toe(s): Secondary | ICD-10-CM

## 2024-01-30 DIAGNOSIS — E1165 Type 2 diabetes mellitus with hyperglycemia: Secondary | ICD-10-CM | POA: Diagnosis not present

## 2024-01-30 DIAGNOSIS — M79674 Pain in right toe(s): Secondary | ICD-10-CM | POA: Diagnosis not present

## 2024-01-30 DIAGNOSIS — Z794 Long term (current) use of insulin: Secondary | ICD-10-CM

## 2024-01-30 DIAGNOSIS — B351 Tinea unguium: Secondary | ICD-10-CM

## 2024-01-30 NOTE — Progress Notes (Signed)
 This patient returns to my office for at risk foot care.  This patient requires this care by a professional since this patient will be at risk due to having diabetes. And CKD.  This patient is unable to cut nails herself since the patient cannot reach her nails.These nails are painful walking and wearing shoes.  This patient presents for at risk foot care today.  General Appearance  Alert, conversant and in no acute stress.  Vascular  Dorsalis pedis and posterior tibial  pulses are palpable  bilaterally.  Capillary return is within normal limits  bilaterally. Temperature is within normal limits  bilaterally.  Neurologic  Senn-Weinstein monofilament wire test within normal limits  bilaterally. Muscle power within normal limits bilaterally.  Nails Thick disfigured discolored nails with subungual debris  from hallux to fifth toes bilaterally. No evidence of bacterial infection or drainage bilaterally.  Orthopedic  No limitations of motion  feet .  No crepitus or effusions noted.  HAV  B/L.  Skin  normotropic skin with no porokeratosis noted bilaterally.  No signs of infections or ulcers noted.     Onychomycosis  Pain in right toes  Pain in left toes  Consent was obtained for treatment procedures.   Mechanical debridement of nails 1-5  bilaterally performed with a nail nipper.  Filed with dremel without incident.    Return office visit     3 months                 Told patient to return for periodic foot care and evaluation due to potential at risk complications.   Ruffin Cotton DPM

## 2024-01-30 NOTE — Telephone Encounter (Signed)
 Advanced Heart Failure Patient Advocate Encounter  Returned patient call regarding Entresto . Patient is due for a refill and previous approval with [employer] has ended.   Review of patients chart shows new coverage for 2025; test claim for Entresto  confirms $12.15 copay for 90 days. Patient is not eligible for [employer] assistance based on this information.  Spoke with patient by phone, she expressed understanding and is comfortable picking up Entresto  from [facility] for this year. Messaged triage to have new rx sent to [facility].   Patient will contact me if cost concerns arise.  Kennis Peacock, CPhT Rx Patient Advocate Phone: [phone]

## 2024-01-31 ENCOUNTER — Other Ambulatory Visit (HOSPITAL_COMMUNITY): Payer: Self-pay

## 2024-02-04 ENCOUNTER — Other Ambulatory Visit: Payer: Self-pay

## 2024-02-05 ENCOUNTER — Other Ambulatory Visit (HOSPITAL_COMMUNITY): Payer: Self-pay

## 2024-02-06 ENCOUNTER — Other Ambulatory Visit (HOSPITAL_COMMUNITY): Payer: Self-pay

## 2024-02-10 LAB — CBC AND DIFFERENTIAL
HCT: 39 (ref 36–46)
Hemoglobin: 12.8 (ref 12.0–16.0)
Platelets: 208 10*3/uL (ref 150–400)
WBC: 6.3

## 2024-02-10 LAB — BASIC METABOLIC PANEL WITH GFR
BUN: 26 — AB (ref 4–21)
CO2: 23 — AB (ref 13–22)
Chloride: 106 (ref 99–108)
Creatinine: 1.6 — AB (ref 0.5–1.1)
Glucose: 215
Potassium: 3.9 meq/L (ref 3.5–5.1)
Sodium: 142 (ref 137–147)

## 2024-02-10 LAB — COMPREHENSIVE METABOLIC PANEL WITH GFR
Albumin: 3.9 (ref 3.5–5.0)
Calcium: 8.7 (ref 8.7–10.7)
eGFR: 38

## 2024-02-10 LAB — CBC: RBC: 4.5 (ref 3.87–5.11)

## 2024-02-16 ENCOUNTER — Other Ambulatory Visit (HOSPITAL_COMMUNITY): Payer: Self-pay

## 2024-02-16 MED ORDER — SPIRONOLACTONE 25 MG PO TABS
12.5000 mg | ORAL_TABLET | Freq: Every day | ORAL | 3 refills | Status: DC
  Filled 2024-02-16: qty 45, 90d supply, fill #0

## 2024-02-24 ENCOUNTER — Other Ambulatory Visit: Payer: Self-pay

## 2024-02-24 ENCOUNTER — Other Ambulatory Visit (HOSPITAL_COMMUNITY): Payer: Self-pay

## 2024-02-24 ENCOUNTER — Other Ambulatory Visit: Payer: Self-pay | Admitting: Physician Assistant

## 2024-02-24 DIAGNOSIS — Z1231 Encounter for screening mammogram for malignant neoplasm of breast: Secondary | ICD-10-CM

## 2024-02-24 MED ORDER — ATORVASTATIN CALCIUM 80 MG PO TABS
80.0000 mg | ORAL_TABLET | Freq: Every evening | ORAL | 3 refills | Status: AC
  Filled 2024-02-24: qty 90, 90d supply, fill #0
  Filled 2024-06-07: qty 90, 90d supply, fill #1

## 2024-02-24 MED ORDER — INSULIN GLARGINE SOLOSTAR 100 UNIT/ML ~~LOC~~ SOPN
22.0000 [IU] | PEN_INJECTOR | Freq: Every day | SUBCUTANEOUS | 1 refills | Status: DC
  Filled 2024-02-24 – 2024-04-09 (×3): qty 18, 81d supply, fill #0
  Filled 2024-08-24 – 2024-09-10 (×2): qty 18, 81d supply, fill #1
  Filled 2024-11-30: qty 3, 13d supply, fill #2

## 2024-02-24 MED ORDER — JARDIANCE 25 MG PO TABS
25.0000 mg | ORAL_TABLET | Freq: Every day | ORAL | 1 refills | Status: DC
  Filled 2024-02-24: qty 90, 90d supply, fill #0
  Filled 2024-06-07: qty 90, 90d supply, fill #1

## 2024-02-29 ENCOUNTER — Other Ambulatory Visit (HOSPITAL_COMMUNITY): Payer: Self-pay

## 2024-03-16 ENCOUNTER — Other Ambulatory Visit (HOSPITAL_COMMUNITY): Payer: Self-pay

## 2024-03-16 ENCOUNTER — Other Ambulatory Visit (HOSPITAL_COMMUNITY): Payer: Self-pay | Admitting: Internal Medicine

## 2024-03-16 ENCOUNTER — Other Ambulatory Visit (HOSPITAL_COMMUNITY): Payer: Self-pay | Admitting: Pharmacist

## 2024-03-16 MED ORDER — ENTRESTO 24-26 MG PO TABS
1.0000 | ORAL_TABLET | Freq: Two times a day (BID) | ORAL | 1 refills | Status: DC
  Filled 2024-03-16: qty 180, 90d supply, fill #0
  Filled 2024-06-29: qty 180, 90d supply, fill #1

## 2024-03-19 ENCOUNTER — Other Ambulatory Visit (HOSPITAL_COMMUNITY): Payer: Self-pay

## 2024-03-19 MED ORDER — ENTRESTO 24-26 MG PO TABS
1.0000 | ORAL_TABLET | Freq: Two times a day (BID) | ORAL | 0 refills | Status: DC
  Filled 2024-03-19: qty 60, 30d supply, fill #0

## 2024-03-21 ENCOUNTER — Other Ambulatory Visit: Payer: Self-pay

## 2024-03-21 ENCOUNTER — Emergency Department (HOSPITAL_BASED_OUTPATIENT_CLINIC_OR_DEPARTMENT_OTHER)

## 2024-03-21 ENCOUNTER — Encounter (HOSPITAL_COMMUNITY): Payer: Self-pay | Admitting: Emergency Medicine

## 2024-03-21 ENCOUNTER — Emergency Department (HOSPITAL_COMMUNITY)

## 2024-03-21 ENCOUNTER — Emergency Department (HOSPITAL_COMMUNITY)
Admission: EM | Admit: 2024-03-21 | Discharge: 2024-03-21 | Disposition: A | Discharge status: Home / Self Care | Attending: Emergency Medicine | Admitting: Emergency Medicine

## 2024-03-21 DIAGNOSIS — I509 Heart failure, unspecified: Secondary | ICD-10-CM | POA: Insufficient documentation

## 2024-03-21 DIAGNOSIS — Z7982 Long term (current) use of aspirin: Secondary | ICD-10-CM | POA: Diagnosis not present

## 2024-03-21 DIAGNOSIS — I130 Hypertensive heart and chronic kidney disease with heart failure and stage 1 through stage 4 chronic kidney disease, or unspecified chronic kidney disease: Secondary | ICD-10-CM | POA: Insufficient documentation

## 2024-03-21 DIAGNOSIS — M7989 Other specified soft tissue disorders: Secondary | ICD-10-CM

## 2024-03-21 DIAGNOSIS — Z794 Long term (current) use of insulin: Secondary | ICD-10-CM | POA: Diagnosis not present

## 2024-03-21 DIAGNOSIS — E1122 Type 2 diabetes mellitus with diabetic chronic kidney disease: Secondary | ICD-10-CM | POA: Insufficient documentation

## 2024-03-21 DIAGNOSIS — Z7901 Long term (current) use of anticoagulants: Secondary | ICD-10-CM | POA: Diagnosis not present

## 2024-03-21 DIAGNOSIS — M79604 Pain in right leg: Secondary | ICD-10-CM | POA: Diagnosis present

## 2024-03-21 DIAGNOSIS — N189 Chronic kidney disease, unspecified: Secondary | ICD-10-CM | POA: Diagnosis not present

## 2024-03-21 LAB — CBC WITH DIFFERENTIAL/PLATELET
Abs Immature Granulocytes: 0.02 10*3/uL (ref 0.00–0.07)
Basophils Absolute: 0 10*3/uL (ref 0.0–0.1)
Basophils Relative: 1 %
Eosinophils Absolute: 0.2 10*3/uL (ref 0.0–0.5)
Eosinophils Relative: 3 %
HCT: 41.9 % (ref 36.0–46.0)
Hemoglobin: 13.4 g/dL (ref 12.0–15.0)
Immature Granulocytes: 0 %
Lymphocytes Relative: 27 %
Lymphs Abs: 1.6 10*3/uL (ref 0.7–4.0)
MCH: 28.3 pg (ref 26.0–34.0)
MCHC: 32 g/dL (ref 30.0–36.0)
MCV: 88.6 fL (ref 80.0–100.0)
Monocytes Absolute: 0.4 10*3/uL (ref 0.1–1.0)
Monocytes Relative: 7 %
Neutro Abs: 3.5 10*3/uL (ref 1.7–7.7)
Neutrophils Relative %: 62 %
Platelets: 225 10*3/uL (ref 150–400)
RBC: 4.73 MIL/uL (ref 3.87–5.11)
RDW: 14.5 % (ref 11.5–15.5)
WBC: 5.7 10*3/uL (ref 4.0–10.5)
nRBC: 0 % (ref 0.0–0.2)

## 2024-03-21 LAB — BASIC METABOLIC PANEL WITH GFR
Anion gap: 10 (ref 5–15)
BUN: 38 mg/dL — ABNORMAL HIGH (ref 6–20)
CO2: 22 mmol/L (ref 22–32)
Calcium: 9.4 mg/dL (ref 8.9–10.3)
Chloride: 108 mmol/L (ref 98–111)
Creatinine, Ser: 1.78 mg/dL — ABNORMAL HIGH (ref 0.44–1.00)
GFR, Estimated: 33 mL/min — ABNORMAL LOW (ref >60)
Glucose, Bld: 108 mg/dL — ABNORMAL HIGH (ref 70–99)
Potassium: 3.7 mmol/L (ref 3.5–5.1)
Sodium: 140 mmol/L (ref 135–145)

## 2024-03-21 NOTE — Progress Notes (Signed)
 Venous duplex lower ext  has been completed. Refer to [facility] under chart review to view preliminary results.   03/21/2024  7:11 PM Adrienne Lane, Gerarda Gunther

## 2024-03-21 NOTE — ED Triage Notes (Signed)
 Patient presents with right leg pain. Area is warm to the touch, red and swollen. Patient is concerned for blood clot. She also complains of left leg pain. Some swelling is also noted on this leg as well.

## 2024-03-21 NOTE — ED Provider Notes (Addendum)
 [place] [facility]ERGENC[facility] DEPARTMENT AT [facility] Provider Note   CSN: [id number] Arrival date & time: 03/21/24  1507     History  Chief Complaint  Patient presents with   Leg Pain    Adrienne Lane is a 58 y.o. female.   Leg Pain Patient is a 58 year old female presents the ED today complaining of increased shortness of breath even at rest, bilateral lower leg swelling and right sided point tenderness and swelling over.  Reports taking Xarelto but has been frequently missing doses, missing at least 2 a week.  Seen by PCP earlier in the month with leg swelling and shortness of breath.  However notes that symptoms have increased and the lump on her right lower calf has been present x 9 days.  Denies fevers, headache, vision changes, chest pain, abdominal pain, nausea, vomiting, diarrhea, dysuria, melena, hematochezia.     Home Medications Prior to Admission medications   Medication Sig Start Date End Date Taking? Authorizing Provider  acetaminophen (TYLENOL) 500 MG tablet Take 500 mg by mouth every 6 (six) hours as needed for moderate pain or headache.    [provider]  amLODipine (NORVASC) 10 MG tablet Take 1 tablet (10 mg total) by mouth daily. 09/29/21   Bensimhon, Bevelyn Buckles, MD  aspirin EC 81 MG tablet Take 81 mg by mouth daily. Swallow whole.    [provider]  atorvastatin (LIPITOR) 80 MG tablet Take 1 tablet (80 mg total) by mouth at bedtime. 04/11/23     atorvastatin (LIPITOR) 80 MG tablet Take 1 tablet (80 mg total) by mouth at bedtime. 02/24/24     Bromfenac Sodium (PROLENSA) 0.07 % SOLN Place 1 drop into the left eye 4 (four) times daily. 10/30/21   Rennis Chris, MD  Calcium Fructoborate POWD Take 1 tablet by mouth daily.    [provider]  carvedilol (COREG) 6.25 MG tablet Take 1 tablet (6.25 mg total) by mouth 2 (two) times daily with a meal. 08/02/23   Bensimhon, Bevelyn Buckles, MD  dorzolamide-timolol (COSOPT) 2-0.5 % ophthalmic solution  Place 1 drop into the left eye 2 (two) times daily. 01/04/24 01/03/25  Rennis Chris, MD  empagliflozin (JARDIANCE) 10 MG TABS tablet Take 1 Tablet by mouth daily. 04/08/23     empagliflozin (JARDIANCE) 25 MG TABS tablet Take 1 tablet (25 mg total) by mouth daily. 02/24/24     glucose blood (FREESTYLE TEST STRIPS) test strip Use 1 strip 3 (three) times daily 10/06/22     glucose blood (TRUE METRIX BLOOD GLUCOSE TEST) test strip Use to check fasting blood glucose 3 times daily 02/07/22     hydrALAZINE (APRESOLINE) 100 MG tablet Take 1 tablet (100 mg total) by mouth 3 (three) times daily. 11/03/23   Bensimhon, Bevelyn Buckles, MD  insulin aspart (NOVOLOG) 100 UNIT/ML injection Use as directed using sliding scale three times daily. Patient taking differently: Inject 2-5 Units into the skin 3 (three) times daily with meals. Use as directed using sliding scale three times daily. 2 units to 5 units as needed 05/22/21   Arvilla Market, MD  Insulin Glargine Solostar (LANTUS) 100 UNIT/ML Solostar Pen Inject 22 Units into the skin daily. 11/25/23     Insulin Glargine Solostar (LANTUS) 100 UNIT/ML Solostar Pen Inject 22 Units into the skin daily. 02/24/24     Insulin Pen Needle 31G X 5 MM MISC Inject 1 each into the skin daily. 08/13/22     Insulin Syringe-Needle U-100 30G X 5/16" 0.5 ML  MISC Use as directed. 04/09/21   Anders Simmonds, PA-C  isosorbide mononitrate (IMDUR) 60 MG 24 hr tablet Take 1 tablet (60 mg total) by mouth daily. 05/21/22   Bensimhon, Bevelyn Buckles, MD  L-LYSINE PO Take 1 tablet by mouth every morning.    [provider]  Lancets [facility] ULTRASOFT) lancets Check CBG twice a day 01/21/21   Arvilla Market, MD  Lysine HCl 1000 MG TABS Take by mouth.    [provider]  mexiletine (MEXITIL) 150 MG capsule Take 2 capsules (300 mg total) by mouth every 12 (twelve) hours. 08/02/23   Bensimhon, Bevelyn Buckles, MD  MILK THISTLE EXTRACT PO Take by mouth.    [provider]   Multiple Vitamins-Minerals (WOMENS MULTI PO) Take 1 tablet by mouth daily.    [provider]  Multiple Vitamins-Minerals (ZINC PO) Take 1 capsule by mouth daily.    [provider]  NIFEdipine (PROCARDIA XL/NIFEDICAL XL) 60 MG 24 hr tablet Take 1 tablet (60 mg total) by mouth daily. 08/04/23   Laurey Morale, MD  oxyCODONE-acetaminophen (PERCOCET/ROXICET) 5-325 MG tablet Take 1-2 tablets by mouth every 6 (six) hours as needed for severe pain. 01/25/22   Cathren Laine, MD  prednisoLONE acetate (PRED FORTE) 1 % ophthalmic suspension Place 1 drop into the left eye 4 (four) times daily. 09/21/23   Rennis Chris, MD  rivaroxaban (XARELTO) 20 MG TABS tablet Take 1 tablet (20 mg total) by mouth daily with supper. 05/05/23   Bensimhon, Bevelyn Buckles, MD  sacubitril-valsartan (ENTRESTO) 24-26 MG Take 1 tablet by mouth 2 (two) times daily. NEEDS FOLLOW UP APPOINTMENT FOR ANYMORE REFILLS 03/19/24   Bensimhon, Bevelyn Buckles, MD  sacubitril-valsartan (ENTRESTO) 24-26 MG Take 1 tablet by mouth 2 (two) times daily. 03/16/24   Bensimhon, Bevelyn Buckles, MD  spironolactone (ALDACTONE) 25 MG tablet Take 25 mg by mouth daily.    [provider]  spironolactone (ALDACTONE) 25 MG tablet Take 1/2 tablet (12.5 mg total) by mouth daily for blood pressure, heart, kidneys 02/16/24     TRUEplus Lancets 28G MISC Use to check fasting blood glucose 3 times daily 02/07/22     Turmeric (QC TUMERIC COMPLEX PO) Take 1 capsule by mouth daily.    [provider]  UNABLE TO FIND Med Name: Milk thistle. Patient takes 1 capsule by mouth in the evening.    [provider]  Vitamin D-Vitamin K (VITAMIN K2-VITAMIN D3 PO) Take 1 tablet by mouth daily.    [provider]  ELIQUIS 5 MG TABS tablet TAKE 1 TABLET (5 MG TOTAL) BY MOUTH 2 (TWO) TIMES DAILY. 12/24/20 12/27/20  Bensimhon, Bevelyn Buckles, MD  torsemide (DEMADEX) 20 MG tablet TAKE 2 TABLETS (40 MG TOTAL) BY MOUTH 2 (TWO) TIMES DAILY. 11/24/20 12/27/20  Tonye Becket D,  NP      Allergies    Adhesive [tape]    Review of Systems   Review of Systems  Respiratory:  Positive for shortness of breath.   Cardiovascular:  Positive for leg swelling.  All other systems reviewed and are negative.   Physical Exam Updated Vital Signs BP (!) 161/92 (BP Location: Left Arm)   Pulse 76   Temp 97.8 F (36.6 C) (Oral)   Resp 18   LMP 05/12/2020 (Exact Date)   SpO2 100%  Physical Exam Vitals and nursing note reviewed.  Constitutional:      General: She is not in acute distress.    Appearance: Normal appearance. She is not  ill-appearing.  HENT:     Head: Normocephalic and atraumatic.     Mouth/Throat:     Mouth: Mucous membranes are moist.     Pharynx: Oropharynx is clear. No oropharyngeal exudate or posterior oropharyngeal erythema.  Eyes:     General: No scleral icterus.       Right eye: No discharge.        Left eye: No discharge.     Extraocular Movements: Extraocular movements intact.     Conjunctiva/sclera: Conjunctivae normal.  Cardiovascular:     Rate and Rhythm: Normal rate. Rhythm irregular.     Pulses: Normal pulses.     Heart sounds: Normal heart sounds. No murmur heard.    No friction rub. No gallop.  Pulmonary:     Effort: Pulmonary effort is normal. No respiratory distress.     Breath sounds: Normal breath sounds. No stridor. No wheezing, rhonchi or rales.  Abdominal:     General: Abdomen is flat. There is no distension.     Palpations: Abdomen is soft.     Tenderness: There is no abdominal tenderness. There is no right CVA tenderness, left CVA tenderness or guarding.  Musculoskeletal:        General: Tenderness (Mild point tenderness noted over the medial aspect of the right calf approximately 2 cm x 2 cm, palpable nodule noted underneath.) present. No deformity.     Right lower leg: Edema present.     Left lower leg: Edema present.  Skin:    General: Skin is warm and dry.     Coloration: Skin is not pale.     Findings: No  bruising, erythema, lesion or rash.  Neurological:     General: No focal deficit present.     Mental Status: She is alert and oriented to person, place, and time. Mental status is at baseline.     Sensory: No sensory deficit.     Motor: No weakness.     Gait: Gait normal.  Psychiatric:        Mood and Affect: Mood normal.     ED Results / Procedures / Treatments   Labs (all labs ordered are listed, but only abnormal results are displayed) Labs Reviewed  BASIC METABOLIC PANEL WITH GFR - Abnormal; Notable for the following components:      Result Value   Glucose, Bld 108 (*)    BUN 38 (*)    Creatinine, Ser 1.78 (*)    GFR, Estimated 33 (*)    All other components within normal limits  CBC WITH DIFFERENTIAL/PLATELET    EKG None  Radiology DG Chest 2 View Result Date: 03/21/2024 CLINICAL DATA:  Increased shortness of breath EXAM: CHEST - 2 VIEW COMPARISON:  07/14/2022 FINDINGS: The heart size and mediastinal contours are within normal limits. Both lungs are clear. The visualized skeletal structures are unremarkable. IMPRESSION: No active cardiopulmonary disease. Electronically Signed   By: Sharlet Salina M.D.   On: 03/21/2024 19:51   VAS [place] LOWER EXTREMITY VENOUS (DVT) Result Date: 03/21/2024  Lower Venous DVT Study Patient Name:  Adrienne Lane  Date of Exam:   03/21/2024 Medical Rec #: [id number]       Accession #:    [id number] Date of Birth: [birth year]       Patient Gender: F Patient Age:   84 years Exam Location:  [facility] Procedure:      VAS [place] LOWER EXTREMITY VENOUS (DVT) Referring Phys: Baldo Ash --------------------------------------------------------------------------------  Indications: Patient presents with right  leg pain. Warm to touch, red and swollen area of concern at the medial ankle.  Comparison Study: No priors. Performing Technologist: [place] Sink Sturdivant-Jones RDMS, RVT  Examination Guidelines: A complete evaluation includes B-mode imaging, spectral Doppler,  color Doppler, and power Doppler as needed of all accessible portions of each vessel. Bilateral testing is considered an integral part of a complete examination. Limited examinations for reoccurring indications may be performed as noted. The reflux portion of the exam is performed with the patient in reverse Trendelenburg.  +---------+---------------+---------+-----------+----------+--------------+ RIGHT    CompressibilityPhasicitySpontaneityPropertiesThrombus Aging +---------+---------------+---------+-----------+----------+--------------+ CFV      Full           Yes      Yes                                 +---------+---------------+---------+-----------+----------+--------------+ SFJ      Full                                                        +---------+---------------+---------+-----------+----------+--------------+ FV Prox  Full                                                        +---------+---------------+---------+-----------+----------+--------------+ FV Mid   Full                                                        +---------+---------------+---------+-----------+----------+--------------+ FV DistalFull                                                        +---------+---------------+---------+-----------+----------+--------------+ PFV      Full                                                        +---------+---------------+---------+-----------+----------+--------------+ POP      Full           Yes      Yes                                 +---------+---------------+---------+-----------+----------+--------------+ PTV      Full                                                        +---------+---------------+---------+-----------+----------+--------------+ PERO     Full                                                        +---------+---------------+---------+-----------+----------+--------------+    +---------+---------------+---------+-----------+----------+--------------+  LEFT     CompressibilityPhasicitySpontaneityPropertiesThrombus Aging +---------+---------------+---------+-----------+----------+--------------+ CFV      Full           Yes      Yes                                 +---------+---------------+---------+-----------+----------+--------------+ SFJ      Full                                                        +---------+---------------+---------+-----------+----------+--------------+ FV Prox  Full                                                        +---------+---------------+---------+-----------+----------+--------------+ FV Mid   Full                                                        +---------+---------------+---------+-----------+----------+--------------+ FV DistalFull                                                        +---------+---------------+---------+-----------+----------+--------------+ PFV      Full                                                        +---------+---------------+---------+-----------+----------+--------------+ POP      Full           Yes      Yes                                 +---------+---------------+---------+-----------+----------+--------------+ PTV      Full                                                        +---------+---------------+---------+-----------+----------+--------------+ PERO     Full                                                        +---------+---------------+---------+-----------+----------+--------------+     Summary: BILATERAL: - No evidence of deep vein thrombosis seen in the lower extremities, bilaterally. - No evidence of superficial venous thrombosis in the lower extremities, bilaterally. -No evidence of popliteal cyst, bilaterally.   *See table(s) above for measurements and observations. Electronically signed  by Coral Else MD on 03/21/2024 at  7:39:33 PM.    Final     Procedures .Ultrasound ED Soft Tissue  Date/Time: 03/21/2024 8:23 PM  Performed by: Lunette Stands, PA-C Authorized by: Lunette Stands, PA-C   Procedure details:    Indications: limb pain, evaluate for cellulitis and evaluate for foreign body     Transverse view:  Visualized   Longitudinal view:  Visualized   Images: not archived     Limitations:  Body habitus Location:    Location: lower extremity     Side:  Right Findings:     no abscess present    no cellulitis present    no foreign body present     Medications Ordered in ED Medications - No data to display  ED Course/ Medical Decision Making/ A&P                                 Medical Decision Making Amount and/or Complexity of Data Reviewed Labs: ordered. Radiology: ordered. ECG/medicine tests: ordered.   This patient is a 58 year old female  who presents to the ED for concern of bump on the right lower leg x 9 days, bilateral lower leg swelling, shortness of breath has been increasing in severity.   Abdominal exam, patient was noted to be in no distress, speaking in full sentences, alert results 4, afebrile, nonhypoxic, nontachypneic, nontachycardic.  Noted to have bilateral lower leg edema.  Also noted to have a 2 x 2 cm bump on the medial aspect of the right calf.  No overlying erythema, no area of fluctuance.  Negative Homans' sign.  Suspect DVT versus abscess versus cyst.  Due to her missing doses frequently Xarelto and increase of breath and swelling, will have her evaluated for DVT via ultrasound as well as baseline labs and chest x-ray.  Labs are at baseline or unremarkable and chest x-ray was also unremarkable.  DVT study was unremarkable showing no DVTs.  EKG was ordered as well but with these labs having come back and discussed with patient, patient wished to not be further worked up at this time and will instead pursue close cardiology follow-up.  Patient then further clarifies  that the shortness of breath that she has been having is only mild and is not as if she is tachypneic but instead just feels like she has to "take a deeper breath every now and then."  With this in mind, suspect that the bump on her leg is most likely a bite and the leg swelling and shortness of breath is related to her heart failure.  No pleural effusion noted today.  I have low suspicion for ACS, PE, AAS at this time.  Ultrasound was done of her leg and no abscess or cellulitis was noted on ultrasound.  Patient now is expressing desire to go home without further workup.  Patient vital signs remained stable to course of her time here.  She is speaking in full sentences and does not appear to be fluid overloaded.  I believe patient safe to discharge at this time and followed up in outpatient setting urgently.  Patient expressed agreement and understanding of plan.  Patient with very strict return to ED precautions.  Will have her follow-up with cardiology.  I believe patient safe to be discharged at this time.  Differential diagnoses prior to evaluation: The emergent differential diagnosis includes, but is not limited to, DVT, PE,  ACS, cellulitis, abscess, cyst, tumor,. This is not an exhaustive differential.   Past Medical History / Co-morbidities / Social History: A-fib, CHF, type 2 diabetes, HTN, status post hysterectomy, CKD  Additional history: Chart reviewed. Pertinent results include:   Last seen by PCP on 02/24/2024, patient is worried about increased fluids with pitting edema noted at that time.  And inability to lie flat.  Last seen by cardiology on 09/05/2023 with last echo done on 05/06/2023 Noting EF of 55 to 60%.  With mild concentric left ventricular hypertrophy   Lab Tests/Imaging studies: I personally interpreted labs/imaging and the pertinent results include: CBC unremarkable BMP baseline or improved.  Chest x-ray unremarkable DVT study shows no evidence of DVTs or SVTs or  popliteal cyst spine I agree with the radiologist interpretation.  Cardiac monitoring: Patient declined getting EKG at this time.   Medications: No medications needed at this time.  I have reviewed the patients home medicines and have made adjustments as needed.  Disposition: After consideration of the diagnostic results and the patients response to treatment, I feel that the patient would benefit from discharge and treatment as above.   emergency department workup does not suggest an emergent condition requiring admission or immediate intervention beyond what has been performed at this time. The plan is: Follow-up with cardiologist, follow-up with PCP, return for new or worsening symptoms. The patient is safe for discharge and has been instructed to return immediately for worsening symptoms, change in symptoms or any other concerns.  Final Clinical Impression(s) / ED Diagnoses Final diagnoses:  Right leg pain    Rx / DC Orders ED Discharge Orders     None         Lunette Stands, PA-C 03/21/24 2027    Lavonia Drafts 03/21/24 2027    Wynetta Fines, MD 03/21/24 2216

## 2024-03-21 NOTE — Discharge Instructions (Addendum)
 You were seen today for right leg pain and shortness of breath with lower leg swelling on both legs.  Suspect that the bump on your right leg is most likely a bug bite as I did not see any sign of abscess or cellulitis at this time.  DVT study was also done and shown to be negative.  Your labs were also reassuring with your basic metabolic panel being baseline for you.  However with your shortness of breath even though you described as mild, will have you call cardiologist office and schedule a visit for further workup within the week.  Will also have you follow-up with PCP for further management.  Also recommend he continue to take your Xarelto regularly and schedule an alarm for you to make sure that you do not forget any doses.  Return to the ED if you begin to have any new or worsening symptoms including chest pain, shortness of breath, abdominal pain, confusion, one-sided weakness,.

## 2024-03-23 ENCOUNTER — Telehealth (HOSPITAL_COMMUNITY): Payer: Self-pay | Admitting: Cardiology

## 2024-03-23 NOTE — Telephone Encounter (Signed)
 Patient called to request an appt for weight gain (no weights to reports-currently driving) Mild SOB and swelling  Add on 4/8 # 2

## 2024-03-27 ENCOUNTER — Other Ambulatory Visit (HOSPITAL_COMMUNITY): Payer: Self-pay

## 2024-03-27 ENCOUNTER — Encounter (HOSPITAL_COMMUNITY): Payer: Self-pay

## 2024-03-27 ENCOUNTER — Ambulatory Visit (HOSPITAL_COMMUNITY)
Admission: RE | Admit: 2024-03-27 | Discharge: 2024-03-27 | Disposition: A | Discharge status: Home / Self Care | Source: Ambulatory Visit | Attending: Family Medicine | Admitting: Family Medicine

## 2024-03-27 VITALS — BP 140/76 | HR 73 | Wt 258.0 lb

## 2024-03-27 DIAGNOSIS — I10 Essential (primary) hypertension: Secondary | ICD-10-CM

## 2024-03-27 DIAGNOSIS — I493 Ventricular premature depolarization: Secondary | ICD-10-CM | POA: Diagnosis not present

## 2024-03-27 DIAGNOSIS — Z6839 Body mass index (BMI) 39.0-39.9, adult: Secondary | ICD-10-CM | POA: Insufficient documentation

## 2024-03-27 DIAGNOSIS — I480 Paroxysmal atrial fibrillation: Secondary | ICD-10-CM

## 2024-03-27 DIAGNOSIS — E669 Obesity, unspecified: Secondary | ICD-10-CM

## 2024-03-27 DIAGNOSIS — Z833 Family history of diabetes mellitus: Secondary | ICD-10-CM | POA: Insufficient documentation

## 2024-03-27 DIAGNOSIS — R0683 Snoring: Secondary | ICD-10-CM

## 2024-03-27 DIAGNOSIS — Z8249 Family history of ischemic heart disease and other diseases of the circulatory system: Secondary | ICD-10-CM | POA: Insufficient documentation

## 2024-03-27 DIAGNOSIS — Z823 Family history of stroke: Secondary | ICD-10-CM | POA: Insufficient documentation

## 2024-03-27 DIAGNOSIS — I491 Atrial premature depolarization: Secondary | ICD-10-CM | POA: Insufficient documentation

## 2024-03-27 DIAGNOSIS — Z8673 Personal history of transient ischemic attack (TIA), and cerebral infarction without residual deficits: Secondary | ICD-10-CM | POA: Diagnosis not present

## 2024-03-27 DIAGNOSIS — I6350 Cerebral infarction due to unspecified occlusion or stenosis of unspecified cerebral artery: Secondary | ICD-10-CM

## 2024-03-27 DIAGNOSIS — Z7901 Long term (current) use of anticoagulants: Secondary | ICD-10-CM | POA: Diagnosis not present

## 2024-03-27 DIAGNOSIS — Z794 Long term (current) use of insulin: Secondary | ICD-10-CM | POA: Diagnosis not present

## 2024-03-27 DIAGNOSIS — I5022 Chronic systolic (congestive) heart failure: Secondary | ICD-10-CM | POA: Diagnosis present

## 2024-03-27 DIAGNOSIS — Z7984 Long term (current) use of oral hypoglycemic drugs: Secondary | ICD-10-CM | POA: Insufficient documentation

## 2024-03-27 DIAGNOSIS — N184 Chronic kidney disease, stage 4 (severe): Secondary | ICD-10-CM | POA: Diagnosis not present

## 2024-03-27 DIAGNOSIS — I130 Hypertensive heart and chronic kidney disease with heart failure and stage 1 through stage 4 chronic kidney disease, or unspecified chronic kidney disease: Secondary | ICD-10-CM | POA: Diagnosis not present

## 2024-03-27 DIAGNOSIS — E1122 Type 2 diabetes mellitus with diabetic chronic kidney disease: Secondary | ICD-10-CM | POA: Insufficient documentation

## 2024-03-27 DIAGNOSIS — N183 Chronic kidney disease, stage 3 unspecified: Secondary | ICD-10-CM

## 2024-03-27 DIAGNOSIS — Z79899 Other long term (current) drug therapy: Secondary | ICD-10-CM | POA: Diagnosis not present

## 2024-03-27 LAB — BASIC METABOLIC PANEL WITH GFR
Anion gap: 9 (ref 5–15)
BUN: 31 mg/dL — ABNORMAL HIGH (ref 6–20)
CO2: 21 mmol/L — ABNORMAL LOW (ref 22–32)
Calcium: 8.8 mg/dL — ABNORMAL LOW (ref 8.9–10.3)
Chloride: 111 mmol/L (ref 98–111)
Creatinine, Ser: 2.03 mg/dL — ABNORMAL HIGH (ref 0.44–1.00)
GFR, Estimated: 28 mL/min — ABNORMAL LOW (ref >60)
Glucose, Bld: 162 mg/dL — ABNORMAL HIGH (ref 70–99)
Potassium: 3.7 mmol/L (ref 3.5–5.1)
Sodium: 141 mmol/L (ref 135–145)

## 2024-03-27 LAB — BRAIN NATRIURETIC PEPTIDE: B Natriuretic Peptide: 72.4 pg/mL (ref 0.0–100.0)

## 2024-03-27 MED ORDER — SPIRONOLACTONE 25 MG PO TABS
25.0000 mg | ORAL_TABLET | Freq: Every day | ORAL | 3 refills | Status: AC
  Filled 2024-03-27 – 2024-05-02 (×5): qty 90, 90d supply, fill #0
  Filled 2024-08-08: qty 90, 90d supply, fill #1
  Filled 2024-10-29: qty 90, 90d supply, fill #2
  Filled 2024-12-01: qty 90, 90d supply, fill #3

## 2024-03-27 MED ORDER — FUROSEMIDE 20 MG PO TABS
20.0000 mg | ORAL_TABLET | ORAL | 3 refills | Status: DC
  Filled 2024-03-27: qty 12, 28d supply, fill #0

## 2024-03-27 NOTE — Progress Notes (Signed)
 [facility] Note PCP: Quita Skye, PA-C Primary Cardiologist: Dr. Bjorn Pippin HF MD: Dr. Gala Romney Nephorology: Dr. Marisue Humble   HPI: Adrienne Lane is a 58 y.o. woman with morbid obesity, HTN, DM2, PAF, PVCs, CKD3b and chronic systolic HF.   Admitted to [facility] 10/20 after a mechanical fall. ECHO EF 20-25% with severe RV dysfunction . 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 Renal function worsened. CT showed bilateral staghorn calculi without hydronephrosis and severe anasarca. Also found to have uterine mass felt to be large fibroid. Diuresed 90 pounds. Placed on carvedilol and mexiletine due to high PVC burden.   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 Saw Dr. Ladona Ridgel who was considering PVC ablation. Doubted mexilitene is helping much.  Admitted 6/21 with AKI on CKD. CT scan of the abdomen which showed bilateral hydronephrosis and large fibroid uterus.  Creatinine on presentation was 3.24. Bilateral ureteral stent placement with bilateral retrograde pyelography. Creatinine improved.   S/p total abdominal hysterectomy and bilateral salpingoopherectomy 8/21  Admitted 1/22 for acute CVA. Presented w/ facial droop and blurry vision. She presented outside of window for TPA.  Seen by neurology/stroke team.  MRI brain showed acute infarct in the right hemipons, additional small acute infarct in the left frontal subcortical white matter.  Mild associated edema in the right pons without substantial mass-effect. Echo EF 45-50%. Eliquis was switched to Pradaxa. She was discharged to CIR. While in CIR she developed upper respiratory symptoms and tested + for COVID and was readmitted to [facility] under TRH.   Zio 4/22 showed frequent PVCs but EF stable and asymptomatic. Continued on mexiltiene.    Echo 5/24 showed EF improved 55-60%.  PSG 6/24 no significant OSA.  Seen in ED 03/21/24 with LEE and SOB. LE dopplers negative for DVT and CXR stable.  Felt swelling 2/2 to CHF vs cellultis or bug bite. She was discharged home.  Today she returns for HF follow up. Last seen 9/24. Overall feeling fine. Has LEE x 1 week. Sometimes missed nighttime dose of meds. She has mild SOB walking on flat ground. Denies palpitations, abnormal bleeding, CP, dizziness, or PND/Orthopnea. Appetite ok. No fever or chills. Weight at home 253 pounds. Taking all medications. Undecided about bariatric surgery. Not checking BP at home. Open to idea of GLP1.  Cardiac Studies:   - Echo (5/24): EF 55-60%, RV ok  - Zio (4/22): 740 runs SVT, PVCs 21%. PACs 5.6%  - Echo (1/22): EF 45-50%. RV mildly reduced  - Echo (2/21): EF 40-45% hard to assess with PVCs. (read formally as 50-55%) RV mildly down No effusion.   - Echo (11/20): EF 20-25%, pericardial effusion, mild LVH, RA/LA dilated.  RV down.   - RHC 11/12/19  RA = 11 RV = 47/15 PA = 49/17 (32) PCW = 20 Fick cardiac output/index = 8.8/3.8 PVR = 1.4 WU FA sat = 97% PA sat = 70%, 72% High SVC = 68%  ROS: All systems negative except as listed in HPI, PMH and Problem List.  SH:  Social History   Socioeconomic History   Marital status: Married    Spouse name: Not on file   Number of children: 0   Years of education: Not on file   Highest education level: Associate degree: occupational, Scientist, product/process development, or vocational program  Occupational History   Occupation: unemployed  Tobacco Use   Smoking status: Never   Smokeless tobacco: Never  Vaping Use   Vaping status: Never Used  Substance and Sexual Activity   Alcohol use: Never   Drug use: Never   Sexual activity: Yes    Birth control/protection: Surgical    Comment: Hysterectomy  Other Topics Concern   Not on file  Social History Narrative   Not on file   Social Drivers of Health   Financial Resource Strain: Not at Risk (02/24/2024)   Received from [employer]    Financial Resource Strain: 1  Food Insecurity: Not at Risk  (02/24/2024)   Received from [facility] Insecurity    Food: 1  Transportation Needs: Not at Risk (02/24/2024)   Received from [employer] Needs    Transportation: 1  Physical Activity: Not on File (04/08/2022)   Received from [place], [place]   Physical Activity    Physical Activity: 0  Stress: Not on File (04/08/2022)   Received from [facility], [place]   Stress    Stress: 0  Social Connections: Not on File (09/02/2023)   Received from [facility]   Social Connections    Connectedness: 0  Intimate Partner Violence: Unknown (03/25/2022)   Received from [employer], [facility]   HITS    Physically Hurt: Not on file    Insult or Talk Down To: Not on file    Threaten Physical Harm: Not on file    Scream or Curse: Not on file   FH:  Family History  Problem Relation Age of Onset   Atrial fibrillation Mother    Hypertension Mother    Stroke Mother 73   Diabetes Mother    Diabetes Father    Hypertension Father    Diabetes Sister    Hypertension Sister    Diabetes Brother    Hypertension Brother    Diabetes Brother    Heart attack Brother    Stroke Maternal Grandmother 45   Past Medical History:  Diagnosis Date   Atrial fibrillation with RVR (HCC)    Bigeminy 01/2020   Cataract    CHF (congestive heart failure) (HCC) 10/20/2019   CKD (chronic kidney disease), stage IV (HCC)    Diabetes mellitus without complication (HCC)    Diabetic retinopathy (HCC)    DOE (dyspnea on exertion)    walking upstairs or up hill resolves in one minute   Dysrhythmia    a-fib - on pradaxa   Fibroids    History of kidney stones    History of recent blood transfusion 02/26/2020   Hypertension    Hypertensive retinopathy    Iron deficiency anemia    Non-ischemic cardiomyopathy (HCC)    tachycardia induced   Obese    Peripheral edema    Premature ventricular contractions (PVCs) (VPCs)    Sleep apnea    does not use cpap   Stroke (HCC) 12/2020   Umbilical hernia    Wears glasses     Current Outpatient Medications  Medication Sig Dispense Refill   acetaminophen (TYLENOL) 500 MG tablet Take 500 mg by mouth every 6 (six) hours as needed for moderate pain or headache.     amLODipine (NORVASC) 10 MG tablet Take 1 tablet (10 mg total) by mouth daily. 30 tablet 6   aspirin EC 81 MG tablet Take 81 mg by mouth daily. Swallow whole.     atorvastatin (LIPITOR) 80 MG tablet Take 1 tablet (80 mg total) by mouth at bedtime. 90 tablet 3   carvedilol (COREG) 6.25 MG tablet Take 1 tablet (6.25 mg total) by mouth 2 (  two) times daily with a meal. 180 tablet 3   empagliflozin (JARDIANCE) 25 MG TABS tablet Take 1 tablet (25 mg total) by mouth daily. 90 tablet 1   glucose blood (FREESTYLE TEST STRIPS) test strip Use 1 strip 3 (three) times daily 100 each 11   glucose blood (TRUE METRIX BLOOD GLUCOSE TEST) test strip Use to check fasting blood glucose 3 times daily 50 strip 6   hydrALAZINE (APRESOLINE) 100 MG tablet Take 1 tablet (100 mg total) by mouth 3 (three) times daily. 90 tablet 3   insulin aspart (NOVOLOG) 100 UNIT/ML injection Use as directed using sliding scale three times daily. (Patient taking differently: Inject 2-5 Units into the skin 3 (three) times daily with meals. Use as directed using sliding scale three times daily. 2 units to 5 units as needed) 10 mL PRN   Insulin Glargine Solostar (LANTUS) 100 UNIT/ML Solostar Pen Inject 22 Units into the skin daily. 19.8 mL 1   Insulin Pen Needle 31G X 5 MM MISC Inject 1 each into the skin daily. 100 each 3   Insulin Syringe-Needle U-100 30G X 5/16" 0.5 ML MISC Use as directed. 100 each 11   isosorbide mononitrate (IMDUR) 60 MG 24 hr tablet Take 1 tablet (60 mg total) by mouth daily. 30 tablet 3   L-LYSINE PO Take 1 tablet by mouth every morning.     Lancets (ONETOUCH ULTRASOFT) lancets Check CBG twice a day 60 each 5   mexiletine (MEXITIL) 150 MG capsule Take 2 capsules (300 mg total) by mouth every 12 (twelve) hours. 120 capsule 6    MILK THISTLE EXTRACT PO Take 1 tablet by mouth daily.     Multiple Vitamins-Minerals (WOMENS MULTI PO) Take 1 tablet by mouth daily.     Multiple Vitamins-Minerals (ZINC PO) Take 1 capsule by mouth daily.     NIFEdipine (PROCARDIA XL/NIFEDICAL XL) 60 MG 24 hr tablet Take 1 tablet (60 mg total) by mouth daily. 90 tablet 3   rivaroxaban (XARELTO) 20 MG TABS tablet Take 1 tablet (20 mg total) by mouth daily with supper. 30 tablet 11   sacubitril-valsartan (ENTRESTO) 24-26 MG Take 1 tablet by mouth 2 (two) times daily. 180 tablet 1   spironolactone (ALDACTONE) 25 MG tablet Take 1/2 tablet (12.5 mg total) by mouth daily for blood pressure, heart, kidneys 45 tablet 3   TRUEplus Lancets 28G MISC Use to check fasting blood glucose 3 times daily 100 each 3   Turmeric (QC TUMERIC COMPLEX PO) Take 1 capsule by mouth daily.     UNABLE TO FIND Med Name: Milk thistle. Patient takes 1 capsule by mouth in the evening.     Vitamin D-Vitamin K (VITAMIN K2-VITAMIN D3 PO) Take 1 tablet by mouth daily.     No current facility-administered medications for this encounter.   BP (!) 140/76   Pulse 73   Wt 117 kg (258 lb)   LMP 05/12/2020 (Exact Date)   SpO2 99%   BMI 39.23 kg/m   Wt Readings from Last 3 Encounters:  03/27/24 117 kg (258 lb)  12/02/23 115.7 kg (255 lb)  10/12/23 115.3 kg (254 lb 3.2 oz)   PHYSICAL EXAM: General:  NAD. No resp difficulty, walked into clinic HEENT: Normal Neck: Supple. No JVD. Cor: Irregular rate & rhythm . No rubs, gallops or murmurs. Lungs: Clear Abdomen: Soft, obese, nontender, nondistended.  Extremities: No cyanosis, clubbing, rash, 1+ pre-tibial BLE edema Neuro: Alert & oriented x 3, moves all 4 extremities w/o difficulty.  Affect pleasant.  ECG (personally reviewed): NSR with PVCs and PACs  ReDs reading: 38%, abnormal  ASSESSMENT & PLAN: 1. Chronic Systolic Heart Failure - Echo (11/20):  EF 20-25%, pericardial effusion, mild LVH, RA/LA dilated.  RV .  - Possible  Tachy Mediated cardiomyopathy verus PVC. She has not had LHC due to elevated creatinine.  - Had RHC (11/20) with preserved cardiac output.  - Echo (2/21): EF 40-45% (Hard to assess with PVCs) RV mild HK. No effusion - Echo (1/22): EF 45-50%. RV mildly reduced  - Echo (5/24): EF 55-60%, RV ok - Stable NYHA II, volume up a bit on exam, ReDs up a bit 38%. - Increase spiro to 25 mg daily. - Start Lasix 20 mg MWF. - Continue Entresto 24/26 mg bid. - Continue Coreg 6.25 mg bid. - Continue Jardiance 25 mg daily.  - Continue hydralazine 100 mg tid + Imdur 60 mg daily.  - Labs today. Repeat BMET in 1 week. - Update echo - Given Rx for compression hose.  2. Frequent PVCs  - Failed amio - Zio Patch 12/20 Frequent PVCs (19.0%, 42278) with two predominant morphologies (9.8 & 9.3%, respectively).  - Zio 4/22 740 runs SVT, PVCs 21%, PACs 5.6% - On mexilitene 300 mg bid. - PVCs on ECG today. - Follows with Dr. Ladona Ridgel, not ablation candidate.   3. PAF  - NSR on ECG today. - Continue carvedilol 6.25 mg bid.  - Had CVA w/ Eliquis, now on Xarelto. No bleeding issues.    4. CKD 3b - Baseline SCr 1.8-2 - Follows with Nephrology.  - Continue SGLT2i.  - Labs today.  5. HTN  - BP stable - GDMT as above.  - I asked her to check BP daily and log.     6. Type 2 DM - Continue Jardiance - She in on insulin - Last A1C 7.6 - Per PCP - Interested in GLP1 (see below)   7. Uterine Fibroids  - s/p total abdominal hysterectomy and bilateral salpingoopherectomy  8. Snoring - no significant OSA on PSG 6/24  9. CVA - CVA 1/21 in setting of Afib - Failed Eliquis, now on Xarelto. - Followed by Dr. Pearlean Brownie.  10. Obesity - Body mass index is 39.23 kg/m. - She has been considering bariatric surgery - We discussed GLP1 meds today, she is agreeable to trial. - Refer to PharmD for semaglutide vs tirzepatide. If approved, I will notify her PCP who manages her insulin.  Follow up in 3 months with Dr.  Gala Romney + echo  Anderson [facility] FNP-BC  2:00 PM

## 2024-03-27 NOTE — Patient Instructions (Signed)
 Medication Changes:  INCREASE SPIRONOLACTONE 25MG  ONCE DAILY   START: LASIX (FUROSEMIDE) 20MG  ON MONDAY, WEDNESDAY, AND FRIDAY   Lab Work:  Labs done today, your results will be available in MyChart, we will contact you for abnormal readings.  THEN LABS AGAIN IN 1 WEEK AS SCHEDULED   Referrals:  YOU HAVE BEEN REFERRED TO PHARMD THEY WILL REACH OUT TO YOU OR CALL TO ARRANGE THIS. PLEASE CALL us WITH ANY CONCERNS   Follow-Up in: 3 MONTHS WITH AN ECHO PLEASE CALL OUR OFFICE AROUND JUNE TO GET SCHEDULED FOR YOUR APPOINTMENT. PHONE NUMBER IS [phone] OPTION 2   At the [facility], you and your health needs are our priority. We have a designated team specialized in the treatment of Heart Failure. This Care Team includes your primary Heart Failure Specialized Cardiologist (physician), Advanced Practice Providers (APPs- Physician Assistants and Nurse Practitioners), and Pharmacist who all work together to provide you with the care you need, when you need it.   You may see any of the following providers on your designated Care Team at your next follow up:  Dr. Arvilla Meres Dr. Marca Ancona Dr. Dorthula Nettles Dr. Theresia Bough Tonye Becket, NP Robbie Lis, [place] [facility] [place], [place] Brynda Peon, NP [place] Lee, NP Karle Plumber, PharmD   Please be sure to bring in all your medications bottles to every appointment.   Need to Contact us:  If you have any questions or concerns before your next appointment please send [place] a message through [place] or call our office at [phone].    TO LEAVE A MESSAGE FOR THE NURSE SELECT OPTION 2, PLEASE LEAVE A MESSAGE INCLUDING: YOUR NAME DATE OF BIRTH CALL BACK NUMBER REASON FOR CALL**this is important as we prioritize the call backs  YOU WILL RECEIVE A CALL BACK THE SAME DAY AS LONG AS YOU CALL BEFORE 4:00 PM

## 2024-03-27 NOTE — Progress Notes (Signed)
 ReDS Vest / Clip - 03/27/24 1400       ReDS Vest / Clip   Station Marker C    Ruler Value 28.5    ReDS Value Range Moderate volume overload    ReDS Actual Value 38

## 2024-03-28 ENCOUNTER — Other Ambulatory Visit (HOSPITAL_COMMUNITY): Payer: Self-pay

## 2024-03-28 ENCOUNTER — Telehealth (HOSPITAL_COMMUNITY): Payer: Self-pay | Admitting: *Deleted

## 2024-03-28 DIAGNOSIS — I5022 Chronic systolic (congestive) heart failure: Secondary | ICD-10-CM

## 2024-03-28 MED ORDER — FUROSEMIDE 20 MG PO TABS
20.0000 mg | ORAL_TABLET | ORAL | 3 refills | Status: DC | PRN
  Filled 2024-03-28: qty 30, fill #0
  Filled 2024-04-10 – 2024-04-17 (×3): qty 30, 70d supply, fill #0

## 2024-03-28 NOTE — Telephone Encounter (Signed)
 Called patient per Adrienne Rome, NP with following lab results and instructions:  "Renal function up a bit. OK to keep spiro at 25 mg daily, but change Lasix to MWF x 1 week, then back to PRN. She has repeat labs to follow kidney function"  Pt verbalized understanding of same. She has scheduled lab appointment and plans on keeping the appointment.

## 2024-04-06 ENCOUNTER — Ambulatory Visit (HOSPITAL_COMMUNITY)
Admission: RE | Admit: 2024-04-06 | Discharge: 2024-04-06 | Disposition: A | Discharge status: Home / Self Care | Source: Ambulatory Visit | Attending: Cardiology | Admitting: Cardiology

## 2024-04-06 DIAGNOSIS — I5022 Chronic systolic (congestive) heart failure: Secondary | ICD-10-CM | POA: Insufficient documentation

## 2024-04-06 LAB — BASIC METABOLIC PANEL WITH GFR
Anion gap: 9 (ref 5–15)
BUN: 39 mg/dL — ABNORMAL HIGH (ref 6–20)
CO2: 21 mmol/L — ABNORMAL LOW (ref 22–32)
Calcium: 8.7 mg/dL — ABNORMAL LOW (ref 8.9–10.3)
Chloride: 113 mmol/L — ABNORMAL HIGH (ref 98–111)
Creatinine, Ser: 1.95 mg/dL — ABNORMAL HIGH (ref 0.44–1.00)
GFR, Estimated: 30 mL/min — ABNORMAL LOW (ref >60)
Glucose, Bld: 178 mg/dL — ABNORMAL HIGH (ref 70–99)
Potassium: 4.2 mmol/L (ref 3.5–5.1)
Sodium: 143 mmol/L (ref 135–145)

## 2024-04-09 ENCOUNTER — Other Ambulatory Visit (HOSPITAL_COMMUNITY): Payer: Self-pay

## 2024-04-11 ENCOUNTER — Other Ambulatory Visit (HOSPITAL_COMMUNITY): Payer: Self-pay

## 2024-04-11 ENCOUNTER — Encounter (HOSPITAL_COMMUNITY): Payer: Self-pay | Admitting: Pharmacist

## 2024-04-16 ENCOUNTER — Other Ambulatory Visit (HOSPITAL_COMMUNITY): Payer: Self-pay

## 2024-04-17 ENCOUNTER — Other Ambulatory Visit: Payer: Self-pay

## 2024-04-17 ENCOUNTER — Other Ambulatory Visit (HOSPITAL_COMMUNITY): Payer: Self-pay

## 2024-04-24 ENCOUNTER — Ambulatory Visit: Admission: EM | Admit: 2024-04-24 | Discharge: 2024-04-24 | Discharge status: Against Medical Advice

## 2024-04-24 DIAGNOSIS — Z5329 Procedure and treatment not carried out because of patient's decision for other reasons: Secondary | ICD-10-CM

## 2024-04-24 NOTE — ED Triage Notes (Signed)
 Patient not in waiting area when called.  Phoned patient/family member (by patient access), "she went to another urgent care".

## 2024-04-24 NOTE — ED Provider Notes (Signed)
 Erroneous encounter    Buena Carmine, NP 04/24/24 2000

## 2024-04-26 ENCOUNTER — Other Ambulatory Visit (HOSPITAL_COMMUNITY): Payer: Self-pay

## 2024-04-30 ENCOUNTER — Ambulatory Visit: Payer: Medicare Other | Admitting: Podiatry

## 2024-04-30 ENCOUNTER — Other Ambulatory Visit (HOSPITAL_COMMUNITY): Payer: Self-pay

## 2024-04-30 MED ORDER — FREESTYLE LIBRE 2 SENSOR MISC
3 refills | Status: DC
  Filled 2024-04-30: qty 6, 84d supply, fill #0

## 2024-05-01 ENCOUNTER — Other Ambulatory Visit (HOSPITAL_COMMUNITY): Payer: Self-pay

## 2024-05-01 MED ORDER — DOXYCYCLINE HYCLATE 100 MG PO TABS
100.0000 mg | ORAL_TABLET | Freq: Two times a day (BID) | ORAL | 0 refills | Status: DC
  Filled 2024-05-01: qty 14, 7d supply, fill #0

## 2024-05-02 ENCOUNTER — Other Ambulatory Visit (HOSPITAL_COMMUNITY): Payer: Self-pay

## 2024-05-02 ENCOUNTER — Other Ambulatory Visit: Payer: Self-pay

## 2024-05-09 ENCOUNTER — Other Ambulatory Visit (HOSPITAL_COMMUNITY): Payer: Self-pay

## 2024-05-10 ENCOUNTER — Telehealth (HOSPITAL_COMMUNITY): Payer: Self-pay | Admitting: Cardiology

## 2024-05-10 DIAGNOSIS — I5022 Chronic systolic (congestive) heart failure: Secondary | ICD-10-CM

## 2024-05-10 NOTE — Telephone Encounter (Signed)
 Adrienne Finger, NP with [facility] Left VM on triage line, reports BLE despite lasix  three times weekly -referral was placed to vascular at appt -if additional changes are needed please reach out to the patient directly, no need for return call however if provider would like to speak with Tiffany NP [phone]  Returned call to patient -denies SOB,CP -reports some relief with starting lasix , however swelling is still present  -weight today 252, weight normally 248-252 -pt reports compliance with compression socks -reports compliance with sodium and fluid restrictions  -vitals today 128/76 -lasix  20 MWF  Please advise

## 2024-05-11 ENCOUNTER — Other Ambulatory Visit (HOSPITAL_COMMUNITY): Payer: Self-pay

## 2024-05-11 MED ORDER — FUROSEMIDE 20 MG PO TABS
20.0000 mg | ORAL_TABLET | Freq: Every day | ORAL | 3 refills | Status: AC
Start: 2024-05-11 — End: ?
  Filled 2024-05-11: qty 30, 30d supply, fill #0

## 2024-05-11 NOTE — Telephone Encounter (Signed)
 Please call. Increase lasix  20 mg daily. Needs BMET in 7 days.   Shenika Quint NP-C   7:56 AM

## 2024-05-11 NOTE — Telephone Encounter (Signed)
Pt aware.

## 2024-05-11 NOTE — Addendum Note (Signed)
 Addended by: Edris Gowers on: 05/11/2024 02:18 PM   Modules accepted: Orders

## 2024-05-22 ENCOUNTER — Other Ambulatory Visit (HOSPITAL_COMMUNITY): Payer: Self-pay

## 2024-05-23 ENCOUNTER — Ambulatory Visit (HOSPITAL_COMMUNITY): Payer: Self-pay | Admitting: Adult Health

## 2024-05-23 ENCOUNTER — Ambulatory Visit (HOSPITAL_COMMUNITY)
Admission: RE | Admit: 2024-05-23 | Discharge: 2024-05-23 | Disposition: A | Discharge status: Home / Self Care | Source: Ambulatory Visit | Attending: Cardiology | Admitting: Cardiology

## 2024-05-23 ENCOUNTER — Other Ambulatory Visit (HOSPITAL_COMMUNITY)

## 2024-05-23 DIAGNOSIS — I5022 Chronic systolic (congestive) heart failure: Secondary | ICD-10-CM | POA: Insufficient documentation

## 2024-05-23 LAB — BASIC METABOLIC PANEL WITH GFR
Anion gap: 9 (ref 5–15)
BUN: 40 mg/dL — ABNORMAL HIGH (ref 6–20)
CO2: 21 mmol/L — ABNORMAL LOW (ref 22–32)
Calcium: 9.3 mg/dL (ref 8.9–10.3)
Chloride: 112 mmol/L — ABNORMAL HIGH (ref 98–111)
Creatinine, Ser: 2.24 mg/dL — ABNORMAL HIGH (ref 0.44–1.00)
GFR, Estimated: 25 mL/min — ABNORMAL LOW (ref >60)
Glucose, Bld: 156 mg/dL — ABNORMAL HIGH (ref 70–99)
Potassium: 4.1 mmol/L (ref 3.5–5.1)
Sodium: 142 mmol/L (ref 135–145)

## 2024-05-23 MED ORDER — FUROSEMIDE 20 MG PO TABS
20.0000 mg | ORAL_TABLET | ORAL | Status: AC
Start: 2024-05-23 — End: ?

## 2024-05-23 NOTE — Telephone Encounter (Addendum)
 Pt aware, agreeable, and verbalized understanding  Med list updated  ----- Message from Nieves Bars sent at 05/23/2024  4:15 PM EDT ----- Renal function elevated. Would hold lasix  x 2 days then start lasix  20 mg every other day.

## 2024-05-30 ENCOUNTER — Ambulatory Visit: Attending: Cardiovascular Disease | Admitting: Pharmacist Clinician (PhC)/ Clinical Pharmacy Specialist

## 2024-05-30 ENCOUNTER — Telehealth: Payer: Self-pay | Admitting: Pharmacist Clinician (PhC)/ Clinical Pharmacy Specialist

## 2024-05-30 ENCOUNTER — Other Ambulatory Visit (HOSPITAL_COMMUNITY): Payer: Self-pay

## 2024-05-30 ENCOUNTER — Encounter: Payer: Self-pay | Admitting: Pharmacist Clinician (PhC)/ Clinical Pharmacy Specialist

## 2024-05-30 ENCOUNTER — Telehealth: Payer: Self-pay

## 2024-05-30 VITALS — Ht 67.0 in | Wt 251.8 lb

## 2024-05-30 DIAGNOSIS — Z794 Long term (current) use of insulin: Secondary | ICD-10-CM | POA: Diagnosis not present

## 2024-05-30 DIAGNOSIS — E119 Type 2 diabetes mellitus without complications: Secondary | ICD-10-CM

## 2024-05-30 DIAGNOSIS — E669 Obesity, unspecified: Secondary | ICD-10-CM | POA: Diagnosis not present

## 2024-05-30 NOTE — Assessment & Plan Note (Signed)
 Patient with DM2, currently on Jardiance  and lantus  insulin  (20 u per day).  She has Freestyle Libre 2, but has been unable to start, as could not figure out how to use.  Chris Pavero PharmD came into the room and showed her a video of how to apply the sensor.  She is aware to call with any questions or concerns.   She will continue with 20 U Lantus  daily, but knows to watch for any signs of low blood sugar or need to decrease dose.  Last A1c in March was 7.6.

## 2024-05-30 NOTE — Telephone Encounter (Signed)
 New Encounter has been or will be created for follow up. For additional info see Pharmacy Prior Auth telephone encounter from 05/30/24.

## 2024-05-30 NOTE — Progress Notes (Signed)
 Office Visit    Patient Name: Adrienne Lane Date of Encounter: 05/30/2024  Primary Care Provider:  Leanora Prophet, PA-C Primary Cardiologist:  Jules Oar, MD  Chief Complaint    Weight management  Significant Past Medical History   HTN Elevated at last visit 140/76; on nifedipine , hydralazine   DM2 3/25 A1c 7.6, on Jardiance , basal insulin   CHF 10/20 EF at 20-25%. Most recent 5/24 recovered to 55-60%; on GDMT  HLD LDL on atorvastatin  80  CKD Stage 4, GFR 25 (6/25)  CVA 12/2020 - acute infarct; Eliquis  switched to Pradaxa , now on Xarelto   AF Paroxysmal, on carvedilol , Xarelto , CHADS2-VASc = 6    Allergies  Allergen Reactions   Adhesive [Tape]     Tears skin, can tolerate paper tape   Latex     Other Reaction(s) from [facility]: Hives, Itching    History of Present Illness    Adrienne Lane is a 58 y.o. female patient of Dr Julane Ny, in the office today to discuss options for weight management.  She also has DM2, currently on basal insulin  and empagliflozin .  Has not been checking her blood sugars, as she was recently prescribed a [facility] 2, but has had trouble getting the app on her phone and actually applying the sensor.    If diabetic and on insulin /sulfonylurea, can consider reducing dose to reduce risk of hypoglycemia:  Lantus  20 U daily, patient aware to monitor blood sugar  Current weight management medications: none  Baseline weight/BMI: 251.8 lb // 39.43  Insurance payor:  Managed Medicaid/Medicare  Diet: breakfast is usually egg, [place] bacon and orange; limits proteins to chicken and fish, plenty of vegetables/salad during the day, tries to eat apple for afternoon fruit.   Exercise: none regularly, tries to walk when can, some limitations in mobility   Accessory Clinical Findings    Lab Results  Component Value Date   CREATININE 2.24 (H) 05/23/2024   BUN 40 (H) 05/23/2024   NA 142 05/23/2024   K 4.1 05/23/2024   CL 112 (H)  05/23/2024   CO2 21 (L) 05/23/2024   Lab Results  Component Value Date   ALT 21 04/22/2021   AST 25 04/22/2021   ALKPHOS 106 04/22/2021   BILITOT 0.1 (L) 04/22/2021   Lab Results  Component Value Date   HGBA1C 8.0 (A) 05/22/2021      Home Medications/Allergies    Current Outpatient Medications  Medication Sig Dispense Refill   acetaminophen  (TYLENOL ) 500 MG tablet Take 500 mg by mouth every 6 (six) hours as needed for moderate pain or headache.     amLODipine  (NORVASC ) 10 MG tablet Take 1 tablet (10 mg total) by mouth daily. 30 tablet 6   aspirin  EC 81 MG tablet Take 81 mg by mouth daily. Swallow whole.     atorvastatin  (LIPITOR ) 80 MG tablet Take 1 tablet (80 mg total) by mouth at bedtime. 90 tablet 3   carvedilol  (COREG ) 6.25 MG tablet Take 1 tablet (6.25 mg total) by mouth 2 (two) times daily with a meal. 180 tablet 3   Continuous Glucose Sensor (FREESTYLE LIBRE 2 SENSOR) MISC Combine applicator and sensor to apply to back of arm. Replace after 14 days. 6 each 3   doxycycline  (VIBRA -TABS) 100 MG tablet Take 1 Tablet by mouth 2 (two) times daily for 7 days 14 tablet 0   empagliflozin  (JARDIANCE ) 25 MG TABS tablet Take 1 tablet (25 mg total) by mouth daily. 90 tablet 1   empagliflozin  (JARDIANCE ) 25 MG  TABS tablet Take 1 tablet by mouth daily.     furosemide  (LASIX ) 20 MG tablet Take 1 tablet (20 mg total) by mouth every other day.     glucose blood (FREESTYLE TEST STRIPS) test strip Use 1 strip 3 (three) times daily 100 each 11   glucose blood (TRUE METRIX BLOOD GLUCOSE TEST) test strip Use to check fasting blood glucose 3 times daily 50 strip 6   hydrALAZINE  (APRESOLINE ) 100 MG tablet Take 1 tablet (100 mg total) by mouth 3 (three) times daily. 90 tablet 3   insulin  aspart (NOVOLOG ) 100 UNIT/ML injection Use as directed using sliding scale three times daily. (Patient taking differently: Inject 2-5 Units into the skin 3 (three) times daily with meals. Use as directed using sliding  scale three times daily. 2 units to 5 units as needed) 10 mL PRN   Insulin  Glargine Solostar (LANTUS ) 100 UNIT/ML Solostar Pen Inject 22 Units into the skin daily. 19.8 mL 1   Insulin  Pen Needle 31G X 5 MM MISC Inject 1 each into the skin daily. 100 each 3   Insulin  Syringe-Needle U-100 30G X 5/16 0.5 ML MISC Use as directed. 100 each 11   isosorbide  mononitrate (IMDUR ) 60 MG 24 hr tablet Take 1 tablet (60 mg total) by mouth daily. 30 tablet 3   L-LYSINE PO Take 1 tablet by mouth every morning.     Lancets (ONETOUCH ULTRASOFT) lancets Check CBG twice a day 60 each 5   Lancets MISC Check CBG twice a day     metoprolol  succinate (TOPROL -XL) 50 MG 24 hr tablet Take 50 mg by mouth daily.     mexiletine (MEXITIL ) 150 MG capsule Take 2 capsules (300 mg total) by mouth every 12 (twelve) hours. 120 capsule 6   MILK THISTLE EXTRACT PO Take 1 tablet by mouth daily.     Misc. Devices (ROLLATOR ULTRA-LIGHT) MISC Use to support gait and prevent falls     Multiple Vitamin (MULTI-VITAMIN) tablet Take 1 tablet by mouth daily.     Multiple Vitamins-Minerals (WOMENS MULTI PO) Take 1 tablet by mouth daily.     Multiple Vitamins-Minerals (ZINC PO) Take 1 capsule by mouth daily.     NIFEdipine  (PROCARDIA  XL/NIFEDICAL XL) 60 MG 24 hr tablet Take 1 tablet (60 mg total) by mouth daily. 90 tablet 3   rivaroxaban  (XARELTO ) 20 MG TABS tablet Take 1 tablet (20 mg total) by mouth daily with supper. 30 tablet 11   sacubitril -valsartan  (ENTRESTO ) 24-26 MG Take 1 tablet by mouth 2 (two) times daily. 180 tablet 1   spironolactone  (ALDACTONE ) 25 MG tablet Take 1 tablet (25 mg total) by mouth daily. 90 tablet 3   TRUEplus Lancets 28G MISC Use to check fasting blood glucose 3 times daily 100 each 3   Turmeric (QC TUMERIC COMPLEX PO) Take 1 capsule by mouth daily.     Turmeric 400 MG CAPS 1 capsule.     UNABLE TO FIND Med Name: Milk thistle. Patient takes 1 capsule by mouth in the evening.     Vitamin D -Vitamin K (VITAMIN  K2-VITAMIN D3 PO) Take 1 tablet by mouth daily.     No current facility-administered medications for this visit.     Allergies  Allergen Reactions   Adhesive [Tape]     Tears skin, can tolerate paper tape   Latex     Other Reaction(s) from [facility]: Hives, Itching    Assessment & Plan    Obesity (BMI 30-39.9)  Patient has not  met goal of at least 5% of body weight loss with comprehensive lifestyle modifications alone in the past 3-6 months. Pharmacotherapy is appropriate to pursue as augmentation. Will start Mounjaro.   Advised patient on common side effects including nausea, diarrhea, dyspepsia, decreased appetite, and fatigue. Counseled patient on reducing meal size and how to titrate medication to minimize side effects. Patient aware to call if intolerable side effects or if experiencing dehydration, abdominal pain, or dizziness. Patient will adhere to dietary modifications and will target at least 150 minutes of moderate intensity exercise weekly.   Injection technique reviewed at today's visit.    Titration Plan:  Will plan to follow the titration plan as below, pending patient is tolerating each dose before increasing to the next. Can slow titration if needed for tolerability.    -Month 1: Inject 2.5 mg SQ once weekly x 4 weeks Patient to reach out after 4 weeks to determine changes in DM, weight and eating habits.  Will then determine dose increase  Follow up monthly via MyChart, in person as needed.     Diabetes mellitus type 2, insulin  dependent (HCC) Patient with DM2, currently on Jardiance  and lantus  insulin  (20 u per day).  She has Freestyle Libre 2, but has been unable to start, as could not figure out how to use.  Chris Pavero PharmD came into the room and showed her a video of how to apply the sensor.  She is aware to call with any questions or concerns.   She will continue with 20 U Lantus  daily, but knows to watch for any signs of low blood sugar or need to  decrease dose.  Last A1c in March was 7.6.     Shyler Hamill PharmD CPP [facility]  [address] [place], [place] [address] [phone]

## 2024-05-30 NOTE — Telephone Encounter (Signed)
 Please do PA for Mounjaro (dx DM), if not on formulary then try Ozempic.  Thank you!

## 2024-05-30 NOTE — Patient Instructions (Signed)
 We will start the prior authorization process to get Mounjaro (or Ozempic) covered by your insurance.   TIPS FOR SUCCESS Write down the reasons why you want to lose weight and post it in a place where you'll see it often. Start small and work your way up. Keep in mind that it takes time to achieve goals, and small steps add up. Any additional movements help to burn calories. Taking the stairs rather than the elevator and parking at the far end of your parking lot are easy ways to start. Brisk walking for at least 30 minutes 4 or more days of the week is an excellent goal to work toward  [employer] WHAT IT MEANS TO FEEL FULL Did you know that it can take 15 minutes or more for your brain to receive the message that you've eaten? That means that, if you eat less food, but consume it slower, you may still feel satisfied. Eating a lot of fruits and vegetables can also help you feel fuller. Eat off of smaller plates so that moderate portions don't seem too small  TITRATION PLAN Start with 2.5 mg once weekly for 4 weeks  Reach out to me on MyChart after the 4th dose to determine how you're doing and if we need to stay on the same dose or increase  If you have any questions or concerns, please reach out to me via MyChart or phone [phone])  THANK YOU FOR CHOOSING [facility]

## 2024-05-30 NOTE — Telephone Encounter (Signed)
 Pharmacy Patient Advocate Encounter  Insurance verification completed.   The patient is insured through [employer] claim for [employer]. Currently a quantity of 2 ML is a 28 day supply and the co-pay is $0 . The current 28 day co-pay is, $0.  No PA needed at this time.  This test claim was processed through [facility]- copay amounts may vary at other pharmacies due to pharmacy/plan contracts, or as the patient moves through the different stages of their insurance plan.

## 2024-05-30 NOTE — Assessment & Plan Note (Signed)
  Patient has not met goal of at least 5% of body weight loss with comprehensive lifestyle modifications alone in the past 3-6 months. Pharmacotherapy is appropriate to pursue as augmentation. Will start Mounjaro.   Advised patient on common side effects including nausea, diarrhea, dyspepsia, decreased appetite, and fatigue. Counseled patient on reducing meal size and how to titrate medication to minimize side effects. Patient aware to call if intolerable side effects or if experiencing dehydration, abdominal pain, or dizziness. Patient will adhere to dietary modifications and will target at least 150 minutes of moderate intensity exercise weekly.   Injection technique reviewed at today's visit.    Titration Plan:  Will plan to follow the titration plan as below, pending patient is tolerating each dose before increasing to the next. Can slow titration if needed for tolerability.    -Month 1: Inject 2.5 mg SQ once weekly x 4 weeks Patient to reach out after 4 weeks to determine changes in DM, weight and eating habits.  Will then determine dose increase  Follow up monthly via MyChart, in person as needed.

## 2024-05-31 ENCOUNTER — Other Ambulatory Visit (HOSPITAL_COMMUNITY): Payer: Self-pay

## 2024-05-31 MED ORDER — MOUNJARO 2.5 MG/0.5ML ~~LOC~~ SOAJ
2.5000 mg | SUBCUTANEOUS | 0 refills | Status: AC
  Filled 2024-05-31: qty 2, 28d supply, fill #0

## 2024-05-31 NOTE — Addendum Note (Signed)
 Addended by: Damany Eastman L on: 05/31/2024 01:39 PM   Modules accepted: Orders

## 2024-06-04 ENCOUNTER — Other Ambulatory Visit (HOSPITAL_COMMUNITY): Payer: Self-pay

## 2024-06-04 MED ORDER — FUROSEMIDE 40 MG PO TABS
40.0000 mg | ORAL_TABLET | Freq: Every day | ORAL | 1 refills | Status: DC
  Filled 2024-06-04: qty 90, 90d supply, fill #0
  Filled 2024-09-08: qty 90, 90d supply, fill #1

## 2024-06-11 ENCOUNTER — Ambulatory Visit (INDEPENDENT_AMBULATORY_CARE_PROVIDER_SITE_OTHER): Admitting: Podiatry

## 2024-06-11 ENCOUNTER — Encounter: Payer: Self-pay | Admitting: Podiatry

## 2024-06-11 DIAGNOSIS — M79675 Pain in left toe(s): Secondary | ICD-10-CM

## 2024-06-11 DIAGNOSIS — M79674 Pain in right toe(s): Secondary | ICD-10-CM | POA: Diagnosis not present

## 2024-06-11 DIAGNOSIS — B351 Tinea unguium: Secondary | ICD-10-CM

## 2024-06-11 DIAGNOSIS — N183 Chronic kidney disease, stage 3 unspecified: Secondary | ICD-10-CM | POA: Diagnosis not present

## 2024-06-11 DIAGNOSIS — E1165 Type 2 diabetes mellitus with hyperglycemia: Secondary | ICD-10-CM

## 2024-06-11 DIAGNOSIS — E1122 Type 2 diabetes mellitus with diabetic chronic kidney disease: Secondary | ICD-10-CM

## 2024-06-11 DIAGNOSIS — Z794 Long term (current) use of insulin: Secondary | ICD-10-CM

## 2024-06-11 NOTE — Progress Notes (Signed)
 This patient returns to my office for at risk foot care.  This patient requires this care by a professional since this patient will be at risk due to having diabetes. 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 This patient is unable to cut nails herself since the patient cannot reach her nails.These nails are painful walking and wearing shoes.  This patient presents for at risk foot care today.  General Appearance  Alert, conversant and in no acute stress.  Vascular  Dorsalis pedis and posterior tibial  pulses are palpable  bilaterally.  Capillary return is within normal limits  bilaterally. Temperature is within normal limits  bilaterally.  Neurologic  Senn-Weinstein monofilament wire test within normal limits  bilaterally. Muscle power within normal limits bilaterally.  Nails Thick disfigured discolored nails with subungual debris  from hallux to fifth toes bilaterally. No evidence of bacterial infection or drainage bilaterally.  Orthopedic  No limitations of motion  feet .  No crepitus or effusions noted.  HAV  B/L.  Skin  normotropic skin with no porokeratosis noted bilaterally.  No signs of infections or ulcers noted.     Onychomycosis  Pain in right toes  Pain in left toes  Consent was obtained for treatment procedures.   Mechanical debridement of nails 1-5  bilaterally performed with a nail nipper.  Filed with dremel without incident.    Return office visit     3 months                 Told patient to return for periodic foot care and evaluation due to potential at risk complications.   Cordella Bold DPM

## 2024-06-12 NOTE — Telephone Encounter (Signed)
 Spoke with the patient regarding an issue with their CGM sensor. Attempted to troubleshoot the problem, but the issue persisted despite efforts.  Advised the patient to contact [employer] using the phone number provided on the device box to request a replacement sensor.

## 2024-06-12 NOTE — Telephone Encounter (Signed)
 Pt calling requesting cb.

## 2024-06-20 NOTE — Progress Notes (Signed)
 [facility] - Clinic Note  07/04/2024     CHIEF COMPLAINT Patient presents for Retina Follow Up  HISTORY OF PRESENT ILLNESS: Adrienne Lane is a 58 y.o. female who presents to the clinic today for:  HPI     Retina Follow Up   Patient presents with  Diabetic Retinopathy.  In both eyes.  Severity is moderate.  Duration of 6 months.  Since onset it is stable.  I, the attending physician,  performed the HPI with the patient and updated documentation appropriately.        Comments   6 month Retina eval. Patient states no vision changes. Blood sugar 116      Last edited by Valdemar Rogue, MD on 07/08/2024  1:11 AM.    Patient states her vision is doing well, she is not seeing any floaters  Referring physician: Octavia Charlie Hamilton, MD [address] [place],  [place] [address]  HISTORICAL INFORMATION:   Selected notes from the MEDICAL RECORD NUMBER Referred by Dr. Hamilton Octavia for concern of VH   CURRENT MEDICATIONS: No current outpatient medications on file. (Ophthalmic Drugs)   No current facility-administered medications for this visit. (Ophthalmic Drugs)   Current Outpatient Medications (Other)  Medication Sig   acetaminophen  (TYLENOL ) 500 MG tablet Take 500 mg by mouth every 6 (six) hours as needed for moderate pain or headache.   amLODipine  (NORVASC ) 10 MG tablet Take 1 tablet (10 mg total) by mouth daily.   aspirin  EC 81 MG tablet Take 81 mg by mouth daily. Swallow whole.   atorvastatin  (LIPITOR ) 80 MG tablet Take 1 tablet (80 mg total) by mouth at bedtime.   carvedilol  (COREG ) 6.25 MG tablet Take 1 tablet (6.25 mg total) by mouth 2 (two) times daily with a meal.   Continuous Glucose Sensor (FREESTYLE LIBRE 2 SENSOR) MISC Combine applicator and sensor to apply to back of arm. Replace after 14 days.   doxycycline  (VIBRA -TABS) 100 MG tablet Take 1 Tablet by mouth 2 (two) times daily for 7 days   empagliflozin  (JARDIANCE ) 25 MG TABS tablet Take 1  tablet (25 mg total) by mouth daily.   empagliflozin  (JARDIANCE ) 25 MG TABS tablet Take 1 tablet by mouth daily.   furosemide  (LASIX ) 20 MG tablet Take 1 tablet (20 mg total) by mouth every other day.   furosemide  (LASIX ) 40 MG tablet Take 1 tablet (40 mg total) by mouth daily.   glucose blood (FREESTYLE TEST STRIPS) test strip Use 1 strip 3 (three) times daily   glucose blood (TRUE METRIX BLOOD GLUCOSE TEST) test strip Use to check fasting blood glucose 3 times daily   hydrALAZINE  (APRESOLINE ) 100 MG tablet Take 1 tablet (100 mg total) by mouth 3 (three) times daily.   Insulin  Glargine Solostar (LANTUS ) 100 UNIT/ML Solostar Pen Inject 22 Units into the skin daily.   Insulin  Pen Needle 31G X 5 MM MISC Inject 1 each into the skin daily.   Insulin  Syringe-Needle U-100 30G X 5/16 0.5 ML MISC Use as directed.   isosorbide  mononitrate (IMDUR ) 60 MG 24 hr tablet Take 1 tablet (60 mg total) by mouth daily.   L-LYSINE PO Take 1 tablet by mouth every morning.   Lancets (ONETOUCH ULTRASOFT) lancets Check CBG twice a day   Lancets MISC Check CBG twice a day   metoprolol  succinate (TOPROL -XL) 50 MG 24 hr tablet Take 50 mg by mouth daily.   mexiletine (MEXITIL ) 150 MG capsule Take 2 capsules (300  mg total) by mouth every 12 (twelve) hours.   MILK THISTLE EXTRACT PO Take 1 tablet by mouth daily.   Misc. Devices (ROLLATOR ULTRA-LIGHT) MISC Use to support gait and prevent falls   Multiple Vitamin (MULTI-VITAMIN) tablet Take 1 tablet by mouth daily.   Multiple Vitamins-Minerals (WOMENS MULTI PO) Take 1 tablet by mouth daily.   Multiple Vitamins-Minerals (ZINC PO) Take 1 capsule by mouth daily.   NIFEdipine  (PROCARDIA  XL/NIFEDICAL XL) 60 MG 24 hr tablet Take 1 tablet (60 mg total) by mouth daily.   rivaroxaban  (XARELTO ) 20 MG TABS tablet Take 1 tablet (20 mg total) by mouth daily with supper.   sacubitril -valsartan  (ENTRESTO ) 24-26 MG Take 1 tablet by mouth 2 (two) times daily.   spironolactone  (ALDACTONE ) 25  MG tablet Take 1 tablet (25 mg total) by mouth daily.   tirzepatide  (MOUNJARO ) 2.5 MG/0.5ML Pen Inject 2.5 mg into the skin once a week.   TRUEplus Lancets 28G MISC Use to check fasting blood glucose 3 times daily   Turmeric (QC TUMERIC COMPLEX PO) Take 1 capsule by mouth daily.   Turmeric 400 MG CAPS 1 capsule.   Vitamin D -Vitamin K (VITAMIN K2-VITAMIN D3 PO) Take 1 tablet by mouth daily.   insulin  aspart (NOVOLOG ) 100 UNIT/ML injection Use as directed using sliding scale three times daily. (Patient taking differently: Inject 2-5 Units into the skin 3 (three) times daily with meals. Use as directed using sliding scale three times daily. 2 units to 5 units as needed)   UNABLE TO FIND Med Name: Milk thistle. Patient takes 1 capsule by mouth in the evening.   No current facility-administered medications for this visit. (Other)   REVIEW OF SYSTEMS: ROS   Positive for: Genitourinary, Endocrine, Cardiovascular, Eyes, Respiratory Negative for: Constitutional, Gastrointestinal, Neurological, Skin, Musculoskeletal, HENT, Psychiatric, Allergic/Imm, Heme/Lymph Last edited by German Olam BRAVO, COT on 07/04/2024  9:55 AM.     ALLERGIES Allergies  Allergen Reactions   Adhesive [Tape]     Tears skin, can tolerate paper tape   Latex     Other Reaction(s) from [facility]: Hives, Itching   PAST MEDICAL HISTORY Past Medical History:  Diagnosis Date   Atrial fibrillation with RVR (HCC)    Bigeminy 01/2020   Cataract    CHF (congestive heart failure) (HCC) 10/20/2019   CKD (chronic kidney disease), stage IV (HCC)    Diabetes mellitus without complication (HCC)    Diabetic retinopathy (HCC)    DOE (dyspnea on exertion)    walking upstairs or up hill resolves in one minute   Dysrhythmia    a-fib - on pradaxa    Fibroids    History of kidney stones    History of recent blood transfusion 02/26/2020   Hypertension    Hypertensive retinopathy    Iron deficiency anemia    Non-ischemic  cardiomyopathy (HCC)    tachycardia induced   Obese    Peripheral edema    Premature ventricular contractions (PVCs) (VPCs)    Sleep apnea    does not use cpap   Stroke (HCC) 12/2020   Umbilical hernia    Wears glasses    Past Surgical History:  Procedure Laterality Date   CATARACT EXTRACTION     CHOLECYSTECTOMY     CYSTOSCOPY W/ URETERAL STENT PLACEMENT Bilateral 05/24/2020   Procedure: CYSTOSCOPY WITH RETROGRADE PYELOGRAM/URETERAL STENT PLACEMENT;  Surgeon: Elisabeth Valli BIRCH, MD;  Location: [facility];  Service: Urology;  Laterality: Bilateral;   CYSTOSCOPY/RETROGRADE/URETEROSCOPY Bilateral 04/03/2020   Procedure: CYSTOSCOPY/RETROGRADE/URETEROSCOPY;  Surgeon:  Cam Morene ORN, MD;  Location: [facility];  Service: Urology;  Laterality: Bilateral;   EYE SURGERY Left 08/2021   cataract removed   HYSTERECTOMY ABDOMINAL WITH SALPINGO-OOPHORECTOMY  07/21/2020   Procedure: HYSTERECTOMY ABDOMINAL WITH BILATERAL SALPINGO-OOPHORECTOMY;  Surgeon: Bettina Muskrat, MD;  Location: [facility] OR;  Service: Gynecology;;   INJECTION OF SILICONE OIL Left 10/08/2021   Procedure: INJECTION OF SILICONE OIL;  Surgeon: Valdemar Rogue, MD;  Location: [facility] OR;  Service: Ophthalmology;  Laterality: Left;   IR FLUORO GUIDE CV LINE RIGHT  02/21/2020   IR US  GUIDE VASC ACCESS RIGHT  02/21/2020   MEMBRANE PEEL Left 10/08/2021   Procedure: MEMBRANE PEEL;  Surgeon: Valdemar Rogue, MD;  Location: [facility];  Service: Ophthalmology;  Laterality: Left;   NEPHROLITHOTOMY Left 02/14/2020   Procedure: NEPHROLITHOTOMY PERCUTANEOUS/ SURGEON ACCESS/ LEFT PERCUTANEOUS NEPHROSTOMY TUBE PLACEMENT;  Surgeon: Cam Morene ORN, MD;  Location: [facility];  Service: Urology;  Laterality: Left;   NEPHROLITHOTOMY Left 02/21/2020   Procedure: NEPHROLITHOTOMY PERCUTANEOUS SECOND LOOK;  Surgeon: Cam Morene ORN, MD;  Location: [facility];  Service: Urology;  Laterality: Left;   NEPHROLITHOTOMY Left 02/26/2020   Procedure: NEPHROLITHOTOMY PERCUTANEOUS;   Surgeon: Devere Lonni Righter, MD;  Location: [facility];  Service: Urology;  Laterality: Left;  NEED 150 MIN   NEPHROLITHOTOMY Right 03/24/2020   Procedure: NEPHROLITHOTOMY PERCUTANEOUS WITH ACCESS LEFT STENT REMOVAL;  Surgeon: Cam Morene ORN, MD;  Location: [facility];  Service: Urology;  Laterality: Right;   PARS PLANA VITRECTOMY Left 10/08/2021   Procedure: PARS PLANA VITRECTOMY WITH 25 GAUGE;  Surgeon: Valdemar Rogue, MD;  Location: [facility] OR;  Service: Ophthalmology;  Laterality: Left;   PHOTOCOAGULATION WITH LASER Left 10/08/2021   Procedure: PHOTOCOAGULATION WITH LASER;  Surgeon: Valdemar Rogue, MD;  Location: [facility] OR;  Service: Ophthalmology;  Laterality: Left;   RIGHT HEART CATH N/A 11/09/2019   Procedure: RIGHT HEART CATH;  Surgeon: Cherrie Toribio SAUNDERS, MD;  Location: [facility];  Service: Cardiovascular;  Laterality: N/A;   WISDOM TOOTH EXTRACTION     FAMILY HISTORY Family History  Problem Relation Age of Onset   Atrial fibrillation Mother    Hypertension Mother    Stroke Mother 51   Diabetes Mother    Diabetes Father    Hypertension Father    Diabetes Sister    Hypertension Sister    Diabetes Brother    Hypertension Brother    Diabetes Brother    Heart attack Brother    Stroke Maternal Grandmother 54   SOCIAL HISTORY Social History   Tobacco Use   Smoking status: Never   Smokeless tobacco: Never  Vaping Use   Vaping status: Never Used  Substance Use Topics   Alcohol use: Never   Drug use: Never       OPHTHALMIC EXAM:  Base Eye Exam     Visual Acuity (Snellen - Linear)       Right Left   Dist cc 20/40 20/CF   Dist ph cc 20/25 -1 20/NI    Correction: Glasses         Tonometry (Tonopen, 10:04 AM)       Right Left   Pressure 14 21         Pupils       Dark Light Shape React APD   Right 3 2 Round Brisk None   Left 4 3 Round NR Trace         Visual Fields (Counting fingers)       Left Right  Full   Restrictions Partial outer  superior temporal, inferior temporal, superior nasal, inferior nasal deficiencies          Extraocular Movement       Right Left    Full, Ortho Full, Ortho         Neuro/Psych     Oriented x3: Yes   Mood/Affect: Normal         Dilation     Both eyes: 1.0% Mydriacyl , 2.5% Phenylephrine  @ 10:04 AM           Slit Lamp and Fundus Exam     External Exam       Right Left   External Normal Normal         Slit Lamp Exam       Right Left   Lids/Lashes Dermatochalasis - upper lid Dermatochalasis - upper lid   Conjunctiva/Sclera mild melanosis melanosis   Cornea mild arcus, trace PEE mild arcus, well healed cataract wound, tr. Debris in tear film   Anterior Chamber Deep and clear deep and clear   Iris Round and dilated, No NVI No NVI, Round and moderately dilated   Lens 2+ Nuclear sclerosis, 2+ Cortical cataract PC IOL in good position, 1-2+PCO   Anterior Vitreous Vitreous syneresis, scattered white blood stained vitreous condensations, fibrosis post vitrectomy, good oil fill, +pigment         Fundus Exam       Right Left   Disc Pink and Sharp, Compact, mild fibrosis nasal, mild PPP temporally 1+pallor, sharp nasal rim, +temporal edema - improved   C/D Ratio 0.5 0.3   Macula Good foveal reflex, rare MA, no edema Flat under oil, fibrosis improved, scattered IRH/DBH, residual fibrosis and thickening nasal macula inferior macula   Vessels attenuated, Tortuous, peripheral sclerosis and fibrosis, sclerotic vessels temporal mac Severe attenuation / sclerosis, +fibrosis improved, Tortuous   Periphery Attached, scattered patches of fibrosis and NVE -- regressed, small flap tear at 0130 equator -- no SRF, good laser surrounding, old VH settled inferiorly -- improved; good 360 PRP laser with room for temporal posterior fill in Attached, Scattered DBH; good 360 PRP; scattered fibrosis -- improved           Refraction     Wearing Rx       Sphere Cylinder Axis Add    Right -2.25 +1.00 162 +2.25   Left -2.00 +0.75 012 +2.25           IMAGING AND PROCEDURES  Imaging and Procedures for 07/04/2024  OCT, Retina - OU - Both Eyes       Right Eye Quality was good. Central Foveal Thickness: 216. Progression has been stable. Findings include normal foveal contour, no IRF, no SRF, intraretinal hyper-reflective material (Stable improvement in vitreous opacities / VH, Persistent fibrosis of nasal disc; macula and fovea stable).   Left Eye Quality was good. Central Foveal Thickness: 336. Progression has improved. Findings include no SRF, abnormal foveal contour, intraretinal fluid, macular pucker, inner retinal atrophy, outer retinal atrophy (Retina attached under oil; diffuse atrophy; persistent cystic changes / edema nasal macula-- slightly improved).   Notes *Images captured and stored on drive  Diagnosis / Impression:  OD: stable improvement in vitreous opacities / VH, Persistent fibrosis of nasal disc; macula and fovea stable OS: Retina attached under oil; diffuse atrophy; persistent cystic changes / edema nasal macula- slightly improved  Clinical management:  See below  Abbreviations: NFP - Normal foveal profile. CME - cystoid macular edema. PED -  pigment epithelial detachment. IRF - intraretinal fluid. SRF - subretinal fluid. EZ - ellipsoid zone. ERM - epiretinal membrane. ORA - outer retinal atrophy. ORT - outer retinal tubulation. SRHM - subretinal hyper-reflective material. IRHM - intraretinal hyper-reflective material      Fluorescein  Angiography Optos (Transit OD)       Right Eye Progression has no prior data. Early phase findings include delayed filling, staining, microaneurysm, vascular perfusion defect. Mid/Late phase findings include leakage, staining, microaneurysm, vascular perfusion defect (Prominent patch of vascular non perfusion temporal macula with +perivascular leakage).   Left Eye Progression has no prior data. Early phase  findings include staining, window defect. Mid/Late phase findings include leakage, staining, window defect (Mild perivascular leakage superior midzone).   Notes **Images stored on drive**  Impression: PDR OU OD: Prominent patch of vascular non perfusion temporal macula with +perivascular leakage OS: Mild perivascular leakage superior midzone            ASSESSMENT/PLAN:    ICD-10-CM   1. Proliferative diabetic retinopathy of both eyes with macular edema associated with type 2 diabetes mellitus (HCC)  E11.3513 OCT, Retina - OU - Both Eyes    Fluorescein  Angiography Optos (Transit OD)    2. Traction detachment of left retina  H33.42     3. Diabetes mellitus treated with oral medication (HCC)  E11.9    Z79.84     4. Encounter for long-term (current) use of insulin  (HCC)  Z79.4     5. Vitreous hemorrhage of right eye (HCC)  H43.11     6. Vitreous hemorrhage of left eye (HCC)  H43.12     7. Retinal tear of right eye  H33.311     8. Essential hypertension  I10     9. Hypertensive retinopathy of both eyes  H35.033 Fluorescein  Angiography Optos (Transit OD)    10. Combined forms of age-related cataract of right eye  H25.811     11. Pseudophakia  Z96.1      1-4. Proliferative diabetic retinopathy OU  - delayed f/u -- 11 months instead of 8 weeks (11.10.23-10.16.24)  - A1c was 7.6 (03.07.25)  - s/p PRP OD (09.19.22) - s/p IVA OD #1 (12.23.22), #2 (01.27.23), #3 (02.24.23), #4 (03.24.23), #5 (04.21.23), #6 (05.24.23), #7 (08.04.23), #8 (11.09.23) - s/p IVA OS #1 (01.27.23), #2 (02.24.23), #3 (03.24.23), #4 (04.21.23), #5 (05.24.23), #6 (08.04.23), #7 (11.09.23)             - OD: w/ +NVD  - OS: with chronic, diffuse VH and underlying TRD - exam shows +NVD and scattered fibrosis OD,              - s/p PPV/MP/EL/FAX/silicone oil (1000 cs) OS 10.20.22 for VH and TRD  - intra-op -- retina beneath chronic, dense VH was severely ischemic, florid fibrosis w/ +360 posterior TRD w/  stretch holes  - good oil fill, retina attached under oil; fibrosis improved, and good laser changes in place  - BCVA OS CF from 20/300              - IOP OD 14, OS 21 - patient instructed to begin Cosopt  OS BID - rx sent to pharmacy on file  - FA (07.16.25) shows NI:[id number] patch of vascular non perfusion temporal macula with +perivascular leakage; OS: Mild perivascular leakage superior midzone -- pt would benefit from PRP fill in OD - OCT shows OD: stable improvement in vitreous opacities / VH, Persistent fibrosis of nasal disc; macula and fovea stable; OS: Retina attached  under oil; diffuse atrophy; persistent cystic changes / edema nasal macula- slightly improved - pt using PF and Ketorolac  once daily OS -- okay to stop  - drop instructions reviewed - Recommend holding injection again OU today - pt in agreement - f/u 7.28.25 for PRP fill in OD  5,6.VH OU - New floaters noticed on 12.23.22 exam - likely related to history of PDR and HTN - s/p IVA OD #1 (12.23.22), #2 (01.27.23), #3 (02.24.23), #4 (03.24.23), #5 (04.21.23), #6 (05.24.23), #7 (08.04.23)  - today, stable improvement in VH and BCVA 20/20 OD - recommend holding injections today as above - IVA informed consent obtained and signed, 12.23.22 (OD) - f/u July 28th at 945 - DFE/OCT, PRP fill in OD  7. Retinal tear, OD - small flap tear located at 0130 equator, no SRF - lasered with PRP OD as above on 09.19.22  8,9. Hypertensive retinopathy OU - discussed importance of tight BP control - monitor    10. Mixed Cataract OD - The symptoms of cataract, surgical options, and treatments and risks were discussed with patient.  - discussed diagnosis and progression - under the expert management of Dr. Glendia Gaudy  11. Pseudophakia OS  - s/p CE/IOL (Dr. Gaudy)  - IOL in good position, doing well  - monitor   12. Ocular hypertension OS  - IOP OS 21 today  - recommend Cosopt  BID OS  Ophthalmic Meds Ordered this visit:  No  orders of the defined types were placed in this encounter.    Return in about 12 days (around 07/16/2024) for PDR OU, DFE, OCT, Laser PRP fill in OD.  There are no Patient Instructions on file for this visit.  This document serves as a record of services personally performed by Redell JUDITHANN Hans, MD, PhD. It was created on their behalf by Almetta Pesa, an ophthalmic technician. The creation of this record is the provider's dictation and/or activities during the visit.    Electronically signed by: Almetta Pesa, OA, 07/08/24  1:13 AM  This document serves as a record of services personally performed by Redell JUDITHANN Hans, MD, PhD. It was created on their behalf by Alan PARAS. Delores, OA an ophthalmic technician. The creation of this record is the provider's dictation and/or activities during the visit.    Electronically signed by: Alan PARAS. Delores, OA 07/08/24 1:13 AM  Redell JUDITHANN Hans, M.D., Ph.D. Diseases & Surgery of the Retina and Vitreous [facility]  I have reviewed the above documentation for accuracy and completeness, and I agree with the above. Redell JUDITHANN Hans, M.D., Ph.D. 07/08/24 1:16 AM   Abbreviations: M myopia (nearsighted); A astigmatism; H hyperopia (farsighted); P presbyopia; Mrx spectacle prescription;  CTL contact lenses; OD right eye; OS left eye; OU both eyes  XT exotropia; ET esotropia; PEK punctate epithelial keratitis; PEE punctate epithelial erosions; DES dry eye syndrome; MGD meibomian gland dysfunction; ATs artificial tears; PFAT's preservative free artificial tears; NSC nuclear sclerotic cataract; PSC posterior subcapsular cataract; ERM epi-retinal membrane; PVD posterior vitreous detachment; RD retinal detachment; DM diabetes mellitus; DR diabetic retinopathy; NPDR non-proliferative diabetic retinopathy; PDR proliferative diabetic retinopathy; CSME clinically significant macular edema; DME diabetic macular edema; dbh dot blot hemorrhages; CWS  cotton wool spot; POAG primary open angle glaucoma; C/D cup-to-disc ratio; HVF humphrey visual field; GVF goldmann visual field; OCT optical coherence tomography; IOP intraocular pressure; BRVO Branch retinal vein occlusion; CRVO central retinal vein occlusion; CRAO central retinal artery occlusion; BRAO branch retinal artery occlusion;  RT retinal tear; SB scleral buckle; PPV pars plana vitrectomy; VH Vitreous hemorrhage; PRP panretinal laser photocoagulation; IVK intravitreal kenalog ; VMT vitreomacular traction; MH Macular hole;  NVD neovascularization of the disc; NVE neovascularization elsewhere; AREDS age related eye disease study; ARMD age related macular degeneration; POAG primary open angle glaucoma; EBMD epithelial/anterior basement membrane dystrophy; ACIOL anterior chamber intraocular lens; IOL intraocular lens; PCIOL posterior chamber intraocular lens; Phaco/IOL phacoemulsification with intraocular lens placement; PRK photorefractive keratectomy; LASIK laser assisted in situ keratomileusis; HTN hypertension; DM diabetes mellitus; COPD chronic obstructive pulmonary disease

## 2024-07-04 ENCOUNTER — Encounter (INDEPENDENT_AMBULATORY_CARE_PROVIDER_SITE_OTHER): Payer: Self-pay | Admitting: Ophthalmology

## 2024-07-04 ENCOUNTER — Ambulatory Visit (INDEPENDENT_AMBULATORY_CARE_PROVIDER_SITE_OTHER): Payer: Medicare Other | Admitting: Ophthalmology

## 2024-07-04 DIAGNOSIS — Z961 Presence of intraocular lens: Secondary | ICD-10-CM

## 2024-07-04 DIAGNOSIS — Z7984 Long term (current) use of oral hypoglycemic drugs: Secondary | ICD-10-CM

## 2024-07-04 DIAGNOSIS — E119 Type 2 diabetes mellitus without complications: Secondary | ICD-10-CM

## 2024-07-04 DIAGNOSIS — Z794 Long term (current) use of insulin: Secondary | ICD-10-CM

## 2024-07-04 DIAGNOSIS — E113513 Type 2 diabetes mellitus with proliferative diabetic retinopathy with macular edema, bilateral: Secondary | ICD-10-CM

## 2024-07-04 DIAGNOSIS — H35033 Hypertensive retinopathy, bilateral: Secondary | ICD-10-CM

## 2024-07-04 DIAGNOSIS — I10 Essential (primary) hypertension: Secondary | ICD-10-CM | POA: Diagnosis not present

## 2024-07-04 DIAGNOSIS — H4311 Vitreous hemorrhage, right eye: Secondary | ICD-10-CM

## 2024-07-04 DIAGNOSIS — H25811 Combined forms of age-related cataract, right eye: Secondary | ICD-10-CM

## 2024-07-04 DIAGNOSIS — H3342 Traction detachment of retina, left eye: Secondary | ICD-10-CM

## 2024-07-04 DIAGNOSIS — H4313 Vitreous hemorrhage, bilateral: Secondary | ICD-10-CM

## 2024-07-04 DIAGNOSIS — H33311 Horseshoe tear of retina without detachment, right eye: Secondary | ICD-10-CM

## 2024-07-04 DIAGNOSIS — H4312 Vitreous hemorrhage, left eye: Secondary | ICD-10-CM

## 2024-07-08 ENCOUNTER — Encounter (INDEPENDENT_AMBULATORY_CARE_PROVIDER_SITE_OTHER): Payer: Self-pay | Admitting: Ophthalmology

## 2024-07-12 ENCOUNTER — Other Ambulatory Visit: Payer: Self-pay | Admitting: *Deleted

## 2024-07-12 DIAGNOSIS — M7989 Other specified soft tissue disorders: Secondary | ICD-10-CM

## 2024-07-12 NOTE — Progress Notes (Signed)
 [facility] - Clinic Note  07/16/2024     CHIEF COMPLAINT Patient presents for Retina Follow Up  HISTORY OF PRESENT ILLNESS: Adrienne Lane is a 58 y.o. female who presents to the clinic today for:  HPI     Retina Follow Up   Patient presents with  Diabetic Retinopathy.  In both eyes.  This started 12 days ago.  I, the attending physician,  performed the HPI with the patient and updated documentation appropriately.        Comments   Patient here for 12 days retina follow up for PDR OU. Patient states vision is ok. About the same. No eye pain.       Last edited by Valdemar Rogue, MD on 07/16/2024 11:57 AM.     Patient is here for [facility] OD  Referring physician: Octavia Charlie Hamilton, MD [address] [place],  [place] [address]  HISTORICAL INFORMATION:   Selected notes from the MEDICAL RECORD NUMBER Referred by Dr. Hamilton Octavia for concern of VH   CURRENT MEDICATIONS: No current outpatient medications on file. (Ophthalmic Drugs)   No current facility-administered medications for this visit. (Ophthalmic Drugs)   Current Outpatient Medications (Other)  Medication Sig   acetaminophen  (TYLENOL ) 500 MG tablet Take 500 mg by mouth every 6 (six) hours as needed for moderate pain or headache.   amLODipine  (NORVASC ) 10 MG tablet Take 1 tablet (10 mg total) by mouth daily.   aspirin  EC 81 MG tablet Take 81 mg by mouth daily. Swallow whole.   atorvastatin  (LIPITOR ) 80 MG tablet Take 1 tablet (80 mg total) by mouth at bedtime.   carvedilol  (COREG ) 6.25 MG tablet Take 1 tablet (6.25 mg total) by mouth 2 (two) times daily with a meal.   Continuous Glucose Sensor (FREESTYLE LIBRE 2 SENSOR) MISC Combine applicator and sensor to apply to back of arm. Replace after 14 days.   doxycycline  (VIBRA -TABS) 100 MG tablet Take 1 Tablet by mouth 2 (two) times daily for 7 days   empagliflozin  (JARDIANCE ) 25 MG TABS tablet Take 1 tablet (25 mg total) by mouth daily.    empagliflozin  (JARDIANCE ) 25 MG TABS tablet Take 1 tablet by mouth daily.   furosemide  (LASIX ) 20 MG tablet Take 1 tablet (20 mg total) by mouth every other day.   furosemide  (LASIX ) 40 MG tablet Take 1 tablet (40 mg total) by mouth daily.   glucose blood (FREESTYLE TEST STRIPS) test strip Use 1 strip 3 (three) times daily   glucose blood (TRUE METRIX BLOOD GLUCOSE TEST) test strip Use to check fasting blood glucose 3 times daily   hydrALAZINE  (APRESOLINE ) 100 MG tablet Take 1 tablet (100 mg total) by mouth 3 (three) times daily.   insulin  aspart (NOVOLOG ) 100 UNIT/ML injection Use as directed using sliding scale three times daily.   Insulin  Glargine Solostar (LANTUS ) 100 UNIT/ML Solostar Pen Inject 22 Units into the skin daily.   Insulin  Pen Needle 31G X 5 MM MISC Inject 1 each into the skin daily.   Insulin  Syringe-Needle U-100 30G X 5/16 0.5 ML MISC Use as directed.   isosorbide  mononitrate (IMDUR ) 60 MG 24 hr tablet Take 1 tablet (60 mg total) by mouth daily.   L-LYSINE PO Take 1 tablet by mouth every morning.   Lancets (ONETOUCH ULTRASOFT) lancets Check CBG twice a day   Lancets MISC Check CBG twice a day   metoprolol  succinate (TOPROL -XL) 50 MG 24 hr tablet Take 50 mg by mouth  daily.   mexiletine (MEXITIL ) 150 MG capsule Take 2 capsules (300 mg total) by mouth every 12 (twelve) hours.   MILK THISTLE EXTRACT PO Take 1 tablet by mouth daily.   Misc. Devices (ROLLATOR ULTRA-LIGHT) MISC Use to support gait and prevent falls   Multiple Vitamin (MULTI-VITAMIN) tablet Take 1 tablet by mouth daily.   Multiple Vitamins-Minerals (WOMENS MULTI PO) Take 1 tablet by mouth daily.   Multiple Vitamins-Minerals (ZINC PO) Take 1 capsule by mouth daily.   NIFEdipine  (PROCARDIA  XL/NIFEDICAL XL) 60 MG 24 hr tablet Take 1 tablet (60 mg total) by mouth daily.   rivaroxaban  (XARELTO ) 20 MG TABS tablet Take 1 tablet (20 mg total) by mouth daily with supper.   sacubitril -valsartan  (ENTRESTO ) 24-26 MG Take 1  tablet by mouth 2 (two) times daily.   spironolactone  (ALDACTONE ) 25 MG tablet Take 1 tablet (25 mg total) by mouth daily.   tirzepatide  (MOUNJARO ) 2.5 MG/0.5ML Pen Inject 2.5 mg into the skin once a week.   TRUEplus Lancets 28G MISC Use to check fasting blood glucose 3 times daily   Turmeric (QC TUMERIC COMPLEX PO) Take 1 capsule by mouth daily.   Turmeric 400 MG CAPS 1 capsule.   UNABLE TO FIND Med Name: Milk thistle. Patient takes 1 capsule by mouth in the evening.   Vitamin D -Vitamin K (VITAMIN K2-VITAMIN D3 PO) Take 1 tablet by mouth daily.   No current facility-administered medications for this visit. (Other)   REVIEW OF SYSTEMS: ROS   Positive for: Genitourinary, Endocrine, Cardiovascular, Eyes, Respiratory Negative for: Constitutional, Gastrointestinal, Neurological, Skin, Musculoskeletal, HENT, Psychiatric, Allergic/Imm, Heme/Lymph Last edited by Orval Asberry RAMAN, COA on 07/16/2024  9:44 AM.      ALLERGIES Allergies  Allergen Reactions   Adhesive [Tape]     Tears skin, can tolerate paper tape   Latex     Other Reaction(s) from [facility]: Hives, Itching   PAST MEDICAL HISTORY Past Medical History:  Diagnosis Date   Atrial fibrillation with RVR (HCC)    Bigeminy 01/2020   Cataract    CHF (congestive heart failure) (HCC) 10/20/2019   CKD (chronic kidney disease), stage IV (HCC)    Diabetes mellitus without complication (HCC)    Diabetic retinopathy (HCC)    DOE (dyspnea on exertion)    walking upstairs or up hill resolves in one minute   Dysrhythmia    a-fib - on pradaxa    Fibroids    History of kidney stones    History of recent blood transfusion 02/26/2020   Hypertension    Hypertensive retinopathy    Iron deficiency anemia    Non-ischemic cardiomyopathy (HCC)    tachycardia induced   Obese    Peripheral edema    Premature ventricular contractions (PVCs) (VPCs)    Sleep apnea    does not use cpap   Stroke (HCC) 12/2020   Umbilical hernia    Wears  glasses    Past Surgical History:  Procedure Laterality Date   CATARACT EXTRACTION     CHOLECYSTECTOMY     CYSTOSCOPY W/ URETERAL STENT PLACEMENT Bilateral 05/24/2020   Procedure: CYSTOSCOPY WITH RETROGRADE PYELOGRAM/URETERAL STENT PLACEMENT;  Surgeon: Elisabeth Valli BIRCH, MD;  Location: [facility];  Service: Urology;  Laterality: Bilateral;   CYSTOSCOPY/RETROGRADE/URETEROSCOPY Bilateral 04/03/2020   Procedure: CYSTOSCOPY/RETROGRADE/URETEROSCOPY;  Surgeon: Cam Morene ORN, MD;  Location: [facility];  Service: Urology;  Laterality: Bilateral;   EYE SURGERY Left 08/2021   cataract removed   HYSTERECTOMY ABDOMINAL WITH SALPINGO-OOPHORECTOMY  07/21/2020   Procedure:  HYSTERECTOMY ABDOMINAL WITH BILATERAL SALPINGO-OOPHORECTOMY;  Surgeon: Bettina Muskrat, MD;  Location: [facility] OR;  Service: Gynecology;;   INJECTION OF SILICONE OIL Left 10/08/2021   Procedure: INJECTION OF SILICONE OIL;  Surgeon: Valdemar Rogue, MD;  Location: [facility] OR;  Service: Ophthalmology;  Laterality: Left;   IR FLUORO GUIDE CV LINE RIGHT  02/21/2020   IR US  GUIDE VASC ACCESS RIGHT  02/21/2020   MEMBRANE PEEL Left 10/08/2021   Procedure: MEMBRANE PEEL;  Surgeon: Valdemar Rogue, MD;  Location: [facility] OR;  Service: Ophthalmology;  Laterality: Left;   NEPHROLITHOTOMY Left 02/14/2020   Procedure: NEPHROLITHOTOMY PERCUTANEOUS/ SURGEON ACCESS/ LEFT PERCUTANEOUS NEPHROSTOMY TUBE PLACEMENT;  Surgeon: Cam Morene ORN, MD;  Location: [facility];  Service: Urology;  Laterality: Left;   NEPHROLITHOTOMY Left 02/21/2020   Procedure: NEPHROLITHOTOMY PERCUTANEOUS SECOND LOOK;  Surgeon: Cam Morene ORN, MD;  Location: [facility];  Service: Urology;  Laterality: Left;   NEPHROLITHOTOMY Left 02/26/2020   Procedure: NEPHROLITHOTOMY PERCUTANEOUS;  Surgeon: Devere Lonni Righter, MD;  Location: [facility];  Service: Urology;  Laterality: Left;  NEED 150 MIN   NEPHROLITHOTOMY Right 03/24/2020   Procedure: NEPHROLITHOTOMY PERCUTANEOUS WITH ACCESS LEFT STENT REMOVAL;   Surgeon: Cam Morene ORN, MD;  Location: [facility];  Service: Urology;  Laterality: Right;   PARS PLANA VITRECTOMY Left 10/08/2021   Procedure: PARS PLANA VITRECTOMY WITH 25 GAUGE;  Surgeon: Valdemar Rogue, MD;  Location: [facility] OR;  Service: Ophthalmology;  Laterality: Left;   PHOTOCOAGULATION WITH LASER Left 10/08/2021   Procedure: PHOTOCOAGULATION WITH LASER;  Surgeon: Valdemar Rogue, MD;  Location: [facility] OR;  Service: Ophthalmology;  Laterality: Left;   RIGHT HEART CATH N/A 11/09/2019   Procedure: RIGHT HEART CATH;  Surgeon: Cherrie Toribio SAUNDERS, MD;  Location: [facility];  Service: Cardiovascular;  Laterality: N/A;   WISDOM TOOTH EXTRACTION     FAMILY HISTORY Family History  Problem Relation Age of Onset   Atrial fibrillation Mother    Hypertension Mother    Stroke Mother 4   Diabetes Mother    Diabetes Father    Hypertension Father    Diabetes Sister    Hypertension Sister    Diabetes Brother    Hypertension Brother    Diabetes Brother    Heart attack Brother    Stroke Maternal Grandmother 64   SOCIAL HISTORY Social History   Tobacco Use   Smoking status: Never   Smokeless tobacco: Never  Vaping Use   Vaping status: Never Used  Substance Use Topics   Alcohol use: Never   Drug use: Never       OPHTHALMIC EXAM:  Base Eye Exam     Visual Acuity (Snellen - Linear)       Right Left   Dist cc 20/20 -2 20/400   Dist ph cc  20/300 -2    Correction: Glasses         Tonometry (Tonopen, 9:38 AM)       Right Left   Pressure 14 21         Pupils       Dark Light Shape React APD   Right 3 2 Round Brisk None   Left 4 3 Round Minimal Trace         Visual Fields (Counting fingers)       Left Right     Full   Restrictions Partial outer superior temporal, inferior temporal, superior nasal, inferior nasal deficiencies          Extraocular Movement  Right Left    Full, Ortho Full, Ortho         Neuro/Psych     Oriented x3: Yes    Mood/Affect: Normal         Dilation     Both eyes: 1.0% Mydriacyl , 2.5% Phenylephrine  @ 9:38 AM           Slit Lamp and Fundus Exam     External Exam       Right Left   External Normal Normal         Slit Lamp Exam       Right Left   Lids/Lashes Dermatochalasis - upper lid Dermatochalasis - upper lid   Conjunctiva/Sclera mild melanosis melanosis   Cornea mild arcus, trace PEE mild arcus, well healed cataract wound, tr. Debris in tear film   Anterior Chamber Deep and clear deep and clear   Iris Round and dilated, No NVI No NVI, Round and moderately dilated   Lens 2+ Nuclear sclerosis, 2+ Cortical cataract PC IOL in good position, 1-2+PCO   Anterior Vitreous Vitreous syneresis, scattered white blood stained vitreous condensations, fibrosis post vitrectomy, good oil fill, +pigment         Fundus Exam       Right Left   Disc Pink and Sharp, Compact, mild fibrosis nasal, mild PPP temporally 1+pallor, sharp nasal rim, +temporal edema - improved   C/D Ratio 0.5 0.3   Macula Good foveal reflex, rare MA, no edema Flat under oil, fibrosis improved, scattered IRH/DBH, residual fibrosis and thickening nasal macula inferior macula   Vessels attenuated, Tortuous, peripheral sclerosis and fibrosis, sclerotic vessels temporal mac Severe attenuation / sclerosis, +fibrosis improved, Tortuous   Periphery Attached, scattered patches of fibrosis and NVE -- regressed, small flap tear at 0130 equator -- no SRF, good laser surrounding, old VH settled inferiorly -- improved; good 360 PRP laser with room for temporal posterior fill in Attached, Scattered DBH; good 360 PRP; scattered fibrosis -- improved           Refraction     Wearing Rx       Sphere Cylinder Axis Add   Right -2.25 +1.00 162 +2.50   Left -2.25 +1.00 020 +2.50           IMAGING AND PROCEDURES  Imaging and Procedures for 07/16/2024  OCT, Retina - OU - Both Eyes       Right Eye Quality was good. Central  Foveal Thickness: 210. Progression has been stable. Findings include normal foveal contour, no IRF, no SRF, intraretinal hyper-reflective material (Stable improvement in vitreous opacities / VH, Persistent fibrosis of nasal disc; macula and fovea stable).   Left Eye Quality was good. Central Foveal Thickness: 329. Progression has been stable. Findings include no SRF, abnormal foveal contour, intraretinal fluid, macular pucker, inner retinal atrophy, outer retinal atrophy (Retina attached under oil; diffuse atrophy; persistent cystic changes / edema nasal macula ).   Notes *Images captured and stored on drive  Diagnosis / Impression:  OD: stable improvement in vitreous opacities / VH, Persistent fibrosis of nasal disc; macula and fovea stable OS: Retina attached under oil; diffuse atrophy; persistent cystic changes / edema nasal macula  Clinical management:  See below  Abbreviations: NFP - Normal foveal profile. CME - cystoid macular edema. PED - pigment epithelial detachment. IRF - intraretinal fluid. SRF - subretinal fluid. EZ - ellipsoid zone. ERM - epiretinal membrane. ORA - outer retinal atrophy. ORT - outer retinal tubulation. SRHM - subretinal hyper-reflective material.  IRHM - intraretinal hyper-reflective material      Panretinal Photocoagulation - OD - Right Eye       LASER PROCEDURE NOTE  Diagnosis:   Proliferative Diabetic Retinopathy, RIGHT EYE  Procedure:  Pan-retinal photocoagulation using slit lamp laser, RIGHT EYE, fill in  Anesthesia:  Topical  Surgeon: Redell Hans, MD, PhD   Informed consent obtained, operative eye marked, and time out performed prior to initiation of laser.   Lumenis [id number] slit lamp laser Pattern: 2x2 square Power: 280 mW Duration: 30 msec  Spot size: 300 microns  # spots: 474 - posterior fill in, greatest temporal quadrant  Complications: None.  RTC: 3 wks -- DFE/OCT  Patient tolerated the procedure well and received written and  verbal post-procedure care information/education.             ASSESSMENT/PLAN:    ICD-10-CM   1. Proliferative diabetic retinopathy of both eyes with macular edema associated with type 2 diabetes mellitus (HCC)  E11.3513 OCT, Retina - OU - Both Eyes    Panretinal Photocoagulation - OD - Right Eye    2. Traction detachment of left retina  H33.42     3. Diabetes mellitus treated with oral medication (HCC)  E11.9    Z79.84     4. Encounter for long-term (current) use of insulin  (HCC)  Z79.4     5. Vitreous hemorrhage of right eye (HCC)  H43.11     6. Vitreous hemorrhage of left eye (HCC)  H43.12     7. Retinal tear of right eye  H33.311     8. Essential hypertension  I10     9. Hypertensive retinopathy of both eyes  H35.033     10. Combined forms of age-related cataract of right eye  H25.811     11. Pseudophakia  Z96.1     12. 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 Proliferative diabetic retinopathy OU  - delayed f/u -- 11 months instead of 8 weeks (11.10.23-10.16.24)  - A1c was 7.6 (03.07.25)  - s/p PRP OD (09.19.22) - s/p IVA OD #1 (12.23.22), #2 (01.27.23), #3 (02.24.23), #4 (03.24.23), #5 (04.21.23), #6 (05.24.23), #7 (08.04.23), #8 (11.09.23) - s/p IVA OS #1 (01.27.23), #2 (02.24.23), #3 (03.24.23), #4 (04.21.23), #5 (05.24.23), #6 (08.04.23), #7 (11.09.23)             - OD: w/ +NVD  - OS: with chronic, diffuse VH and underlying TRD - exam shows +NVD and scattered fibrosis OD,              - s/p PPV/MP/EL/FAX/silicone oil (1000 cs) OS 10.20.22 for VH and TRD  - intra-op -- retina beneath chronic, dense VH was severely ischemic, florid fibrosis w/ +360 posterior TRD w/ stretch holes  - good oil fill, retina attached under oil; fibrosis improved, and good laser changes in place  - BCVA OS 20/300 from CF              - IOP OD 14, OS 21 -- on Cosopt  BID OS  - FA (07.16.25) shows NI:[id number] patch of vascular non perfusion temporal macula with  +perivascular leakage; OS: Mild perivascular leakage superior midzone -- pt would benefit from PRP fill in OD - OCT shows OD: stable improvement in vitreous opacities / VH, Persistent fibrosis of nasal disc; macula and fovea stable; OS: Retina attached under oil; diffuse atrophy; persistent cystic changes / edema nasal macula- slightly improved - no longer using PF or Ketorolac  once  daily OS  - drop instructions reviewed - Recommend PRP OD today, 07.28.25 - pt wishes to proceed with laser - RBA of procedure discussed, questions answered - informed consent obtained and signed - see procedure note - start PF QID OD x7 days  - f/u 8 weeks, DFE, OCT  5,6.VH OU - New floaters noticed on 12.23.22 exam - likely related to history of PDR and HTN - s/p IVA OD #1 (12.23.22), #2 (01.27.23), #3 (02.24.23), #4 (03.24.23), #5 (04.21.23), #6 (05.24.23), #7 (08.04.23)  - today, stable improvement in VH and BCVA 20/20 OD - recommend holding injections today as above - IVA informed consent obtained and signed, 12.23.22 (OD) - monitor  7. Retinal tear, OD - small flap tear located at 0130 equator, no SRF - lasered with PRP OD as above on 09.19.22 - monitor  8,9. Hypertensive retinopathy OU - discussed importance of tight BP control - monitor   10. Mixed Cataract OD - The symptoms of cataract, surgical options, and treatments and risks were discussed with patient.  - discussed diagnosis and progression - under the expert management of Dr. Glendia Gaudy  11. Pseudophakia OS  - s/p CE/IOL (Dr. Gaudy)  - IOL in good position, doing well  - monitor  12. Ocular hypertension OS  - IOP OS 21 today  - cont Cosopt  BID OS  Ophthalmic Meds Ordered this visit:  No orders of the defined types were placed in this encounter.    Return in about 8 weeks (around 09/10/2024) for f/u PDR OU, DFE, OCT.  There are no Patient Instructions on file for this visit.  This document serves as a record of services  personally performed by Redell JUDITHANN Hans, MD, PhD. It was created on their behalf by Avelina Pereyra, COA an ophthalmic technician. The creation of this record is the provider's dictation and/or activities during the visit.   Electronically signed by: Avelina GORMAN Pereyra, COT  07/16/24  11:59 AM   This document serves as a record of services personally performed by Redell JUDITHANN Hans, MD, PhD. It was created on their behalf by Alan PARAS. Delores, OA an ophthalmic technician. The creation of this record is the provider's dictation and/or activities during the visit.    Electronically signed by: Alan PARAS. Delores, OA 07/16/24 11:59 AM  Redell JUDITHANN Hans, M.D., Ph.D. Diseases & Surgery of the Retina and Vitreous [facility]  I have reviewed the above documentation for accuracy and completeness, and I agree with the above. Redell JUDITHANN Hans, M.D., Ph.D. 07/16/24 12:03 PM   Abbreviations: M myopia (nearsighted); A astigmatism; H hyperopia (farsighted); P presbyopia; Mrx spectacle prescription;  CTL contact lenses; OD right eye; OS left eye; OU both eyes  XT exotropia; ET esotropia; PEK punctate epithelial keratitis; PEE punctate epithelial erosions; DES dry eye syndrome; MGD meibomian gland dysfunction; ATs artificial tears; PFAT's preservative free artificial tears; NSC nuclear sclerotic cataract; PSC posterior subcapsular cataract; ERM epi-retinal membrane; PVD posterior vitreous detachment; RD retinal detachment; DM diabetes mellitus; DR diabetic retinopathy; NPDR non-proliferative diabetic retinopathy; PDR proliferative diabetic retinopathy; CSME clinically significant macular edema; DME diabetic macular edema; dbh dot blot hemorrhages; CWS cotton wool spot; POAG primary open angle glaucoma; C/D cup-to-disc ratio; HVF humphrey visual field; GVF goldmann visual field; OCT optical coherence tomography; IOP intraocular pressure; BRVO Branch retinal vein occlusion; CRVO central retinal vein occlusion;  CRAO central retinal artery occlusion; BRAO branch retinal artery occlusion; RT retinal tear; SB scleral buckle; PPV pars plana vitrectomy;  VH Vitreous hemorrhage; PRP panretinal laser photocoagulation; IVK intravitreal kenalog ; VMT vitreomacular traction; MH Macular hole;  NVD neovascularization of the disc; NVE neovascularization elsewhere; AREDS age related eye disease study; ARMD age related macular degeneration; POAG primary open angle glaucoma; EBMD epithelial/anterior basement membrane dystrophy; ACIOL anterior chamber intraocular lens; IOL intraocular lens; PCIOL posterior chamber intraocular lens; Phaco/IOL phacoemulsification with intraocular lens placement; PRK photorefractive keratectomy; LASIK laser assisted in situ keratomileusis; HTN hypertension; DM diabetes mellitus; COPD chronic obstructive pulmonary disease

## 2024-07-15 ENCOUNTER — Other Ambulatory Visit (HOSPITAL_COMMUNITY): Payer: Self-pay | Admitting: Internal Medicine

## 2024-07-16 ENCOUNTER — Other Ambulatory Visit: Payer: Self-pay

## 2024-07-16 ENCOUNTER — Ambulatory Visit (INDEPENDENT_AMBULATORY_CARE_PROVIDER_SITE_OTHER): Admitting: Ophthalmology

## 2024-07-16 ENCOUNTER — Encounter (INDEPENDENT_AMBULATORY_CARE_PROVIDER_SITE_OTHER): Payer: Self-pay | Admitting: Ophthalmology

## 2024-07-16 DIAGNOSIS — H35033 Hypertensive retinopathy, bilateral: Secondary | ICD-10-CM | POA: Diagnosis not present

## 2024-07-16 DIAGNOSIS — I10 Essential (primary) hypertension: Secondary | ICD-10-CM

## 2024-07-16 DIAGNOSIS — E113513 Type 2 diabetes mellitus with proliferative diabetic retinopathy with macular edema, bilateral: Secondary | ICD-10-CM

## 2024-07-16 DIAGNOSIS — H4312 Vitreous hemorrhage, left eye: Secondary | ICD-10-CM

## 2024-07-16 DIAGNOSIS — Z961 Presence of intraocular lens: Secondary | ICD-10-CM

## 2024-07-16 DIAGNOSIS — H4311 Vitreous hemorrhage, right eye: Secondary | ICD-10-CM

## 2024-07-16 DIAGNOSIS — H3342 Traction detachment of retina, left eye: Secondary | ICD-10-CM

## 2024-07-16 DIAGNOSIS — Z794 Long term (current) use of insulin: Secondary | ICD-10-CM | POA: Diagnosis not present

## 2024-07-16 DIAGNOSIS — H25811 Combined forms of age-related cataract, right eye: Secondary | ICD-10-CM

## 2024-07-16 DIAGNOSIS — H33311 Horseshoe tear of retina without detachment, right eye: Secondary | ICD-10-CM

## 2024-07-16 DIAGNOSIS — E119 Type 2 diabetes mellitus without complications: Secondary | ICD-10-CM

## 2024-07-16 DIAGNOSIS — H40052 Ocular hypertension, left eye: Secondary | ICD-10-CM

## 2024-07-17 ENCOUNTER — Other Ambulatory Visit (HOSPITAL_COMMUNITY): Payer: Self-pay

## 2024-07-17 MED ORDER — RIVAROXABAN 20 MG PO TABS
20.0000 mg | ORAL_TABLET | Freq: Every day | ORAL | 11 refills | Status: AC
  Filled 2024-07-17 – 2024-08-08 (×2): qty 30, 30d supply, fill #0
  Filled 2024-09-08: qty 30, 30d supply, fill #1
  Filled 2024-10-22: qty 30, 30d supply, fill #2

## 2024-07-17 MED ORDER — HYDRALAZINE HCL 100 MG PO TABS
100.0000 mg | ORAL_TABLET | Freq: Three times a day (TID) | ORAL | 3 refills | Status: AC
  Filled 2024-07-17 – 2024-08-08 (×3): qty 90, 30d supply, fill #0
  Filled 2024-10-08: qty 90, 30d supply, fill #1
  Filled 2024-12-01: qty 90, 30d supply, fill #2

## 2024-07-19 ENCOUNTER — Other Ambulatory Visit (HOSPITAL_COMMUNITY): Payer: Self-pay

## 2024-07-19 ENCOUNTER — Other Ambulatory Visit (HOSPITAL_COMMUNITY): Payer: Self-pay | Admitting: Nephrology

## 2024-07-25 ENCOUNTER — Ambulatory Visit

## 2024-07-25 ENCOUNTER — Ambulatory Visit (HOSPITAL_COMMUNITY)

## 2024-07-26 ENCOUNTER — Other Ambulatory Visit (HOSPITAL_COMMUNITY): Payer: Self-pay

## 2024-07-27 ENCOUNTER — Other Ambulatory Visit (HOSPITAL_COMMUNITY): Payer: Self-pay

## 2024-08-07 ENCOUNTER — Other Ambulatory Visit (HOSPITAL_COMMUNITY): Payer: Self-pay

## 2024-08-08 ENCOUNTER — Other Ambulatory Visit (HOSPITAL_COMMUNITY): Payer: Self-pay | Admitting: Nephrology

## 2024-08-08 ENCOUNTER — Other Ambulatory Visit (HOSPITAL_COMMUNITY): Payer: Self-pay

## 2024-08-08 ENCOUNTER — Other Ambulatory Visit (HOSPITAL_COMMUNITY): Payer: Self-pay | Admitting: Cardiology

## 2024-08-08 ENCOUNTER — Other Ambulatory Visit: Payer: Self-pay

## 2024-08-08 MED ORDER — NIFEDIPINE ER OSMOTIC RELEASE 60 MG PO TB24
60.0000 mg | ORAL_TABLET | Freq: Every day | ORAL | 3 refills | Status: AC
  Filled 2024-08-08: qty 90, 90d supply, fill #0
  Filled 2024-11-15: qty 90, 90d supply, fill #1

## 2024-08-21 ENCOUNTER — Other Ambulatory Visit (HOSPITAL_COMMUNITY): Payer: Self-pay

## 2024-08-24 ENCOUNTER — Other Ambulatory Visit (HOSPITAL_COMMUNITY): Payer: Self-pay

## 2024-08-25 ENCOUNTER — Other Ambulatory Visit (HOSPITAL_COMMUNITY): Payer: Self-pay

## 2024-08-27 ENCOUNTER — Other Ambulatory Visit (HOSPITAL_COMMUNITY): Payer: Self-pay

## 2024-09-05 ENCOUNTER — Other Ambulatory Visit (HOSPITAL_COMMUNITY): Payer: Self-pay

## 2024-09-06 NOTE — Progress Notes (Signed)
 [facility] - Clinic Note  09/12/2024     CHIEF COMPLAINT Patient presents for Retina Follow Up  HISTORY OF PRESENT ILLNESS: Adrienne Lane is a 58 y.o. female who presents to the clinic today for:  HPI     Retina Follow Up   Patient presents with  Diabetic Retinopathy.  In both eyes.  Severity is moderate.  Duration of 8 weeks.  Since onset it is stable.  I, the attending physician,  performed the HPI with the patient and updated documentation appropriately.        Comments   8 week Retina eval. Patient states no vision changes. Blood sugar 114       Last edited by Valdemar Rogue, MD on 09/16/2024  2:28 AM.     Patient states the vision is about the same.  Referring physician: Octavia Charlie Hamilton, MD [address] [place],  [place] [address]  HISTORICAL INFORMATION:   Selected notes from the MEDICAL RECORD NUMBER Referred by Dr. Hamilton Octavia for concern of VH   CURRENT MEDICATIONS: No current outpatient medications on file. (Ophthalmic Drugs)   No current facility-administered medications for this visit. (Ophthalmic Drugs)   Current Outpatient Medications (Other)  Medication Sig   acetaminophen  (TYLENOL ) 500 MG tablet Take 500 mg by mouth every 6 (six) hours as needed for moderate pain or headache.   amLODipine  (NORVASC ) 10 MG tablet Take 1 tablet (10 mg total) by mouth daily.   aspirin  EC 81 MG tablet Take 81 mg by mouth daily. Swallow whole.   atorvastatin  (LIPITOR ) 80 MG tablet Take 1 tablet (80 mg total) by mouth at bedtime.   carvedilol  (COREG ) 6.25 MG tablet Take 1 tablet (6.25 mg total) by mouth 2 (two) times daily with a meal.   Continuous Glucose Sensor (FREESTYLE LIBRE 2 SENSOR) MISC Combine applicator and sensor to apply to back of arm. Replace after 14 days.   doxycycline  (VIBRA -TABS) 100 MG tablet Take 1 Tablet by mouth 2 (two) times daily for 7 days   empagliflozin  (JARDIANCE ) 25 MG TABS tablet Take 1 tablet (25 mg total) by mouth  daily.   empagliflozin  (JARDIANCE ) 25 MG TABS tablet Take 1 tablet by mouth daily.   furosemide  (LASIX ) 20 MG tablet Take 1 tablet (20 mg total) by mouth every other day.   furosemide  (LASIX ) 40 MG tablet Take 1 tablet (40 mg total) by mouth daily.   glucose blood (FREESTYLE TEST STRIPS) test strip Use 1 strip 3 (three) times daily   glucose blood (TRUE METRIX BLOOD GLUCOSE TEST) test strip Use to check fasting blood glucose 3 times daily   hydrALAZINE  (APRESOLINE ) 100 MG tablet Take 1 tablet (100 mg total) by mouth 3 (three) times daily.   insulin  aspart (NOVOLOG ) 100 UNIT/ML injection Use as directed using sliding scale three times daily.   Insulin  Glargine Solostar (LANTUS ) 100 UNIT/ML Solostar Pen Inject 22 Units into the skin daily.   Insulin  Pen Needle 31G X 5 MM MISC Inject 1 each into the skin daily.   Insulin  Syringe-Needle U-100 30G X 5/16 0.5 ML MISC Use as directed.   isosorbide  mononitrate (IMDUR ) 60 MG 24 hr tablet Take 1 tablet (60 mg total) by mouth daily.   L-LYSINE PO Take 1 tablet by mouth every morning.   Lancets (ONETOUCH ULTRASOFT) lancets Check CBG twice a day   Lancets MISC Check CBG twice a day   metoprolol  succinate (TOPROL -XL) 50 MG 24 hr tablet Take 50 mg  by mouth daily.   mexiletine (MEXITIL ) 150 MG capsule Take 2 capsules (300 mg total) by mouth every 12 (twelve) hours.   MILK THISTLE EXTRACT PO Take 1 tablet by mouth daily.   Misc. Devices (ROLLATOR ULTRA-LIGHT) MISC Use to support gait and prevent falls   Multiple Vitamin (MULTI-VITAMIN) tablet Take 1 tablet by mouth daily.   Multiple Vitamins-Minerals (WOMENS MULTI PO) Take 1 tablet by mouth daily.   Multiple Vitamins-Minerals (ZINC PO) Take 1 capsule by mouth daily.   NIFEdipine  (PROCARDIA  XL/NIFEDICAL XL) 60 MG 24 hr tablet Take 1 tablet (60 mg total) by mouth daily.   rivaroxaban  (XARELTO ) 20 MG TABS tablet Take 1 tablet (20 mg total) by mouth daily with supper.   sacubitril -valsartan  (ENTRESTO ) 24-26 MG  Take 1 tablet by mouth 2 (two) times daily.   spironolactone  (ALDACTONE ) 25 MG tablet Take 1 tablet (25 mg total) by mouth daily.   tirzepatide  (MOUNJARO ) 2.5 MG/0.5ML Pen Inject 2.5 mg into the skin once a week.   TRUEplus Lancets 28G MISC Use to check fasting blood glucose 3 times daily   Turmeric (QC TUMERIC COMPLEX PO) Take 1 capsule by mouth daily.   Turmeric 400 MG CAPS 1 capsule.   UNABLE TO FIND Med Name: Milk thistle. Patient takes 1 capsule by mouth in the evening.   Vitamin D -Vitamin K (VITAMIN K2-VITAMIN D3 PO) Take 1 tablet by mouth daily.   glucose blood (FREESTYLE TEST STRIPS) test strip Use 1 strip to test blood sugar levels 2 (two) times daily.   Insulin  Glargine Solostar (LANTUS ) 100 UNIT/ML Solostar Pen Inject 18 Units into the skin every morning.   No current facility-administered medications for this visit. (Other)   REVIEW OF SYSTEMS: ROS   Positive for: Genitourinary, Endocrine, Cardiovascular, Eyes, Respiratory Negative for: Constitutional, Gastrointestinal, Neurological, Skin, Musculoskeletal, HENT, Psychiatric, Allergic/Imm, Heme/Lymph Last edited by German Olam BRAVO, COT on 09/12/2024  9:08 AM.     ALLERGIES Allergies  Allergen Reactions   Adhesive [Tape]     Tears skin, can tolerate paper tape   Latex     Other Reaction(s) from [facility]: Hives, Itching   PAST MEDICAL HISTORY Past Medical History:  Diagnosis Date   Atrial fibrillation with RVR (HCC)    Bigeminy 01/2020   Cataract    CHF (congestive heart failure) (HCC) 10/20/2019   CKD (chronic kidney disease), stage IV (HCC)    Diabetes mellitus without complication (HCC)    Diabetic retinopathy (HCC)    DOE (dyspnea on exertion)    walking upstairs or up hill resolves in one minute   Dysrhythmia    a-fib - on pradaxa    Fibroids    History of kidney stones    History of recent blood transfusion 02/26/2020   Hypertension    Hypertensive retinopathy    Iron deficiency anemia    Non-ischemic  cardiomyopathy (HCC)    tachycardia induced   Obese    Peripheral edema    Premature ventricular contractions (PVCs) (VPCs)    Sleep apnea    does not use cpap   Stroke (HCC) 12/2020   Umbilical hernia    Wears glasses    Past Surgical History:  Procedure Laterality Date   CATARACT EXTRACTION     CHOLECYSTECTOMY     CYSTOSCOPY W/ URETERAL STENT PLACEMENT Bilateral 05/24/2020   Procedure: CYSTOSCOPY WITH RETROGRADE PYELOGRAM/URETERAL STENT PLACEMENT;  Surgeon: Elisabeth Valli BIRCH, MD;  Location: [facility];  Service: Urology;  Laterality: Bilateral;   CYSTOSCOPY/RETROGRADE/URETEROSCOPY Bilateral 04/03/2020  Procedure: CYSTOSCOPY/RETROGRADE/URETEROSCOPY;  Surgeon: Cam Morene ORN, MD;  Location: [facility];  Service: Urology;  Laterality: Bilateral;   EYE SURGERY Left 08/2021   cataract removed   HYSTERECTOMY ABDOMINAL WITH SALPINGO-OOPHORECTOMY  07/21/2020   Procedure: HYSTERECTOMY ABDOMINAL WITH BILATERAL SALPINGO-OOPHORECTOMY;  Surgeon: Bettina Muskrat, MD;  Location: [facility] OR;  Service: Gynecology;;   INJECTION OF SILICONE OIL Left 10/08/2021   Procedure: INJECTION OF SILICONE OIL;  Surgeon: Valdemar Rogue, MD;  Location[facility];  Service: Ophthalmology;  Laterality: Left;   IR FLUORO GUIDE CV LINE RIGHT  02/21/2020   IR US  GUIDE VASC ACCESS RIGHT  02/21/2020   MEMBRANE PEEL Left 10/08/2021   Procedure: MEMBRANE PEEL;  Surgeon: Valdemar Rogue, MD;  Location: [facility] OR;  Service: Ophthalmology;  Laterality: Left;   NEPHROLITHOTOMY Left 02/14/2020   Procedure: NEPHROLITHOTOMY PERCUTANEOUS/ SURGEON ACCESS/ LEFT PERCUTANEOUS NEPHROSTOMY TUBE PLACEMENT;  Surgeon: Cam Morene ORN, MD;  Location: [facility];  Service: Urology;  Laterality: Left;   NEPHROLITHOTOMY Left 02/21/2020   Procedure: NEPHROLITHOTOMY PERCUTANEOUS SECOND LOOK;  Surgeon: Cam Morene ORN, MD;  Location: [facility];  Service: Urology;  Laterality: Left;   NEPHROLITHOTOMY Left 02/26/2020   Procedure: NEPHROLITHOTOMY PERCUTANEOUS;   Surgeon: Devere Lonni Righter, MD;  Location: [facility];  Service: Urology;  Laterality: Left;  NEED 150 MIN   NEPHROLITHOTOMY Right 03/24/2020   Procedure: NEPHROLITHOTOMY PERCUTANEOUS WITH ACCESS LEFT STENT REMOVAL;  Surgeon: Cam Morene ORN, MD;  Location: [facility];  Service: Urology;  Laterality: Right;   PARS PLANA VITRECTOMY Left 10/08/2021   Procedure: PARS PLANA VITRECTOMY WITH 25 GAUGE;  Surgeon: Valdemar Rogue, MD;  Location: [facility] OR;  Service: Ophthalmology;  Laterality: Left;   PHOTOCOAGULATION WITH LASER Left 10/08/2021   Procedure: PHOTOCOAGULATION WITH LASER;  Surgeon: Valdemar Rogue, MD;  Location: [facility] OR;  Service: Ophthalmology;  Laterality: Left;   RIGHT HEART CATH N/A 11/09/2019   Procedure: RIGHT HEART CATH;  Surgeon: Cherrie Toribio SAUNDERS, MD;  Location: [facility];  Service: Cardiovascular;  Laterality: N/A;   WISDOM TOOTH EXTRACTION     FAMILY HISTORY Family History  Problem Relation Age of Onset   Atrial fibrillation Mother    Hypertension Mother    Stroke Mother 87   Diabetes Mother    Diabetes Father    Hypertension Father    Diabetes Sister    Hypertension Sister    Diabetes Brother    Hypertension Brother    Diabetes Brother    Heart attack Brother    Stroke Maternal Grandmother 37   SOCIAL HISTORY Social History   Tobacco Use   Smoking status: Never   Smokeless tobacco: Never  Vaping Use   Vaping status: Never Used  Substance Use Topics   Alcohol use: Never   Drug use: Never       OPHTHALMIC EXAM:  Base Eye Exam     Visual Acuity (Snellen - Linear)       Right Left   Dist cc 20/25 20/CF   Dist ph cc 20/NI 20/200    Correction: Glasses         Tonometry (Tonopen, 9:20 AM)       Right Left   Pressure 11 22         Pupils       Dark Light Shape React APD   Right 3 2 Round Brisk None   Left 4 3 Round Minimal Trace         Visual Fields       Left  Right     Full   Restrictions Partial outer superior temporal,  inferior temporal, superior nasal, inferior nasal deficiencies          Extraocular Movement       Right Left    Full, Ortho Full, Ortho         Neuro/Psych     Oriented x3: Yes   Mood/Affect: Normal         Dilation     Both eyes: 1.0% Mydriacyl , 2.5% Phenylephrine  @ 9:20 AM           Slit Lamp and Fundus Exam     External Exam       Right Left   External Normal Normal         Slit Lamp Exam       Right Left   Lids/Lashes Dermatochalasis - upper lid Dermatochalasis - upper lid   Conjunctiva/Sclera mild melanosis melanosis   Cornea mild arcus, trace PEE mild arcus, well healed cataract wound, tr. Debris in tear film   Anterior Chamber Deep and clear deep and clear   Iris Round and dilated, No NVI No NVI, Round and moderately dilated   Lens 2+ Nuclear sclerosis, 2-3+ Cortical cataract PC IOL in good position, 1-2+PCO   Anterior Vitreous Vitreous syneresis, scattered white blood stained vitreous condensations, fibrosis- improving post vitrectomy, good oil fill, +pigment         Fundus Exam       Right Left   Disc Pink and Sharp, Compact, mild fibrosis nasal, mild PPP temporally 1+pallor, sharp nasal rim, +temporal edema - improved   C/D Ratio 0.5 0.3   Macula Good foveal reflex, rare MA, no edema Flat under oil, fibrosis improved, scattered IRH/DBH, residual fibrosis and thickening nasal macula inferior macula   Vessels attenuated, Tortuous, peripheral sclerosis and fibrosis, sclerotic vessels temporal mac Severe attenuation / sclerosis, +fibrosis improved, Tortuous   Periphery Attached, scattered patches of fibrosis and NVE -- regressed, small flap tear at 0130 equator -- no SRF, good laser surrounding, old VH settled inferiorly -- improved; good 360 PRP laser with good fill in changes Attached, Scattered DBH; good 360 PRP; scattered fibrosis -- improved           Refraction     Wearing Rx       Sphere Cylinder Axis Add   Right -2.25 +1.00 162  +2.50   Left -2.25 +1.00 020 +2.50         Manifest Refraction (Auto)       Sphere Cylinder Axis Dist VA   Right       Left +2.75 +1.25 022 20/200-2           IMAGING AND PROCEDURES  Imaging and Procedures for 09/12/2024  OCT, Retina - OU - Both Eyes       Right Eye Quality was good. Central Foveal Thickness: 215. Progression has been stable. Findings include normal foveal contour, no IRF, no SRF, intraretinal hyper-reflective material (Stable improvement in vitreous opacities / VH, Persistent fibrosis of nasal disc; macula and fovea stable).   Left Eye Quality was good. Central Foveal Thickness: 356. Progression has been stable. Findings include no SRF, abnormal foveal contour, intraretinal fluid, macular pucker, inner retinal atrophy, outer retinal atrophy (Retina attached under oil; diffuse atrophy; persistent cystic changes / edema nasal macula ).   Notes *Images captured and stored on drive  Diagnosis / Impression:  OD: stable improvement in vitreous opacities / VH, Persistent fibrosis of nasal disc;  macula and fovea stable OS: Retina attached under oil; diffuse atrophy; persistent cystic changes / edema nasal macula  Clinical management:  See below  Abbreviations: NFP - Normal foveal profile. CME - cystoid macular edema. PED - pigment epithelial detachment. IRF - intraretinal fluid. SRF - subretinal fluid. EZ - ellipsoid zone. ERM - epiretinal membrane. ORA - outer retinal atrophy. ORT - outer retinal tubulation. SRHM - subretinal hyper-reflective material. IRHM - intraretinal hyper-reflective material            ASSESSMENT/PLAN:    ICD-10-CM   1. Proliferative diabetic retinopathy of both eyes with macular edema associated with type 2 diabetes mellitus (HCC)  E11.3513 OCT, Retina - OU - Both Eyes    2. Traction detachment of left retina  H33.42     3. Diabetes mellitus treated with oral medication (HCC)  E11.9    Z79.84     4. Encounter for long-term  (current) use of insulin  (HCC)  Z79.4     5. Vitreous hemorrhage of right eye (HCC)  H43.11     6. Vitreous hemorrhage of left eye (HCC)  H43.12     7. Retinal tear of right eye  H33.311     8. Essential hypertension  I10     9. Hypertensive retinopathy of both eyes  H35.033     10. Combined forms of age-related cataract of right eye  H25.811     11. Pseudophakia  Z96.1     12. 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 Proliferative diabetic retinopathy OU  - delayed f/u -- 11 months instead of 8 weeks (11.10.23-10.16.24)  - A1c was 7.6 (03.07.25)  - s/p PRP OD (09.19.22), (07.28.25) - s/p IVA OD #1 (12.23.22), #2 (01.27.23), #3 (02.24.23), #4 (03.24.23), #5 (04.21.23), #6 (05.24.23), #7 (08.04.23), #8 (11.09.23) - s/p IVA OS #1 (01.27.23), #2 (02.24.23), #3 (03.24.23), #4 (04.21.23), #5 (05.24.23), #6 (08.04.23), #7 (11.09.23)             - OD: w/ +NVD  - OS: with chronic, diffuse VH and underlying TRD - exam shows +NVD and scattered fibrosis OD,   - s/p PPV/MP/EL/FAX/silicone oil (1000 cs) OS 10.20.22 for VH and TRD  - intra-op -- retina beneath chronic, dense VH was severely ischemic, florid fibrosis w/ +360 posterior TRD w/ stretch holes  - good oil fill, retina attached under oil; fibrosis improved, and good laser changes in place  - BCVA OS 20/200 from 20/300             - IOP OD 11, OS 22 -- on Cosopt  BID OS - FA (07.16.25) shows NI:Emnfpwzwu patch of vascular non perfusion temporal macula with +perivascular leakage; OS: Mild perivascular leakage superior midzone -- pt would benefit from PRP fill in OD-- completed 07.28.25 - OCT shows OD: stable improvement in vitreous opacities / VH, Persistent fibrosis of nasal disc; macula and fovea stable; OS: Retina attached under oil; diffuse atrophy; persistent cystic changes / edema nasal macula - no longer using PF or Ketorolac  once daily OS  - drop instructions reviewed  - f/u 4-6 months, DFE, OCT, FA transit  OD  5,6.VH OU - New floaters noticed on 12.23.22 exam - likely related to history of PDR and HTN - s/p IVA OD #1 (12.23.22), #2 (01.27.23), #3 (02.24.23), #4 (03.24.23), #5 (04.21.23), #6 (05.24.23), #7 (08.04.23)  - today, stable improvement in VH and BCVA 20/25 OD - recommend holding injections today as above - IVA informed consent  obtained and signed, 12.23.22 (OD) - monitor  7. Retinal tear, OD - small flap tear located at 0130 equator, no SRF - lasered with PRP OD as above on 09.19.22 - monitor  8,9. Hypertensive retinopathy OU - discussed importance of tight BP control - monitor   10. Mixed Cataract OD - The symptoms of cataract, surgical options, and treatments and risks were discussed with patient.  - discussed diagnosis and progression - under the expert management of Dr. Glendia Gaudy  11. Pseudophakia OS  - s/p CE/IOL (Dr. Gaudy)  - IOL in good position, doing well  - monitor  12. Ocular hypertension OS  - IOP OS 22 today  - cont Cosopt  BID OS  Ophthalmic Meds Ordered this visit:  No orders of the defined types were placed in this encounter.    Return in about 6 months (around 03/12/2025) for f/u, PDR, DFE, OCT, FA, Transit OD.  There are no Patient Instructions on file for this visit.  This document serves as a record of services personally performed by Redell JUDITHANN Hans, MD, PhD. It was created on their behalf by Almetta Pesa, an ophthalmic technician. The creation of this record is the provider's dictation and/or activities during the visit.    Electronically signed by: Almetta Pesa, OA, 09/16/24  2:31 AM  This document serves as a record of services personally performed by Redell JUDITHANN Hans, MD, PhD. It was created on their behalf by Wanda GEANNIE Keens, COT an ophthalmic technician. The creation of this record is the provider's dictation and/or activities during the visit.    Electronically signed by:  Wanda GEANNIE Keens, COT  09/16/24 2:31  AM  Redell JUDITHANN Hans, M.D., Ph.D. Diseases & Surgery of the Retina and Vitreous [facility]  I have reviewed the above documentation for accuracy and completeness, and I agree with the above. Redell JUDITHANN Hans, M.D., Ph.D. 09/16/24 2:33 AM   Abbreviations: M myopia (nearsighted); A astigmatism; H hyperopia (farsighted); P presbyopia; Mrx spectacle prescription;  CTL contact lenses; OD right eye; OS left eye; OU both eyes  XT exotropia; ET esotropia; PEK punctate epithelial keratitis; PEE punctate epithelial erosions; DES dry eye syndrome; MGD meibomian gland dysfunction; ATs artificial tears; PFAT's preservative free artificial tears; NSC nuclear sclerotic cataract; PSC posterior subcapsular cataract; ERM epi-retinal membrane; PVD posterior vitreous detachment; RD retinal detachment; DM diabetes mellitus; DR diabetic retinopathy; NPDR non-proliferative diabetic retinopathy; PDR proliferative diabetic retinopathy; CSME clinically significant macular edema; DME diabetic macular edema; dbh dot blot hemorrhages; CWS cotton wool spot; POAG primary open angle glaucoma; C/D cup-to-disc ratio; HVF humphrey visual field; GVF goldmann visual field; OCT optical coherence tomography; IOP intraocular pressure; BRVO Branch retinal vein occlusion; CRVO central retinal vein occlusion; CRAO central retinal artery occlusion; BRAO branch retinal artery occlusion; RT retinal tear; SB scleral buckle; PPV pars plana vitrectomy; VH Vitreous hemorrhage; PRP panretinal laser photocoagulation; IVK intravitreal kenalog ; VMT vitreomacular traction; MH Macular hole;  NVD neovascularization of the disc; NVE neovascularization elsewhere; AREDS age related eye disease study; ARMD age related macular degeneration; POAG primary open angle glaucoma; EBMD epithelial/anterior basement membrane dystrophy; ACIOL anterior chamber intraocular lens; IOL intraocular lens; PCIOL posterior chamber intraocular lens; Phaco/IOL  phacoemulsification with intraocular lens placement; PRK photorefractive keratectomy; LASIK laser assisted in situ keratomileusis; HTN hypertension; DM diabetes mellitus; COPD chronic obstructive pulmonary disease

## 2024-09-08 ENCOUNTER — Other Ambulatory Visit (HOSPITAL_COMMUNITY): Payer: Self-pay | Admitting: Internal Medicine

## 2024-09-10 ENCOUNTER — Encounter: Payer: Self-pay | Admitting: Podiatry

## 2024-09-10 ENCOUNTER — Ambulatory Visit (INDEPENDENT_AMBULATORY_CARE_PROVIDER_SITE_OTHER): Admitting: Podiatry

## 2024-09-10 ENCOUNTER — Other Ambulatory Visit: Payer: Self-pay

## 2024-09-10 ENCOUNTER — Other Ambulatory Visit (HOSPITAL_COMMUNITY): Payer: Self-pay

## 2024-09-10 DIAGNOSIS — M79674 Pain in right toe(s): Secondary | ICD-10-CM | POA: Diagnosis not present

## 2024-09-10 DIAGNOSIS — M79675 Pain in left toe(s): Secondary | ICD-10-CM | POA: Diagnosis not present

## 2024-09-10 DIAGNOSIS — N183 Chronic kidney disease, stage 3 unspecified: Secondary | ICD-10-CM

## 2024-09-10 DIAGNOSIS — E1122 Type 2 diabetes mellitus with diabetic chronic kidney disease: Secondary | ICD-10-CM | POA: Diagnosis not present

## 2024-09-10 DIAGNOSIS — B351 Tinea unguium: Secondary | ICD-10-CM

## 2024-09-10 DIAGNOSIS — Z794 Long term (current) use of insulin: Secondary | ICD-10-CM

## 2024-09-10 DIAGNOSIS — E1165 Type 2 diabetes mellitus with hyperglycemia: Secondary | ICD-10-CM

## 2024-09-10 MED ORDER — MEXILETINE HCL 150 MG PO CAPS
300.0000 mg | ORAL_CAPSULE | Freq: Two times a day (BID) | ORAL | 6 refills | Status: AC
  Filled 2024-09-10 – 2024-09-20 (×2): qty 120, 30d supply, fill #0
  Filled 2024-11-23: qty 120, 30d supply, fill #1
  Filled 2024-12-24: qty 120, 30d supply, fill #2

## 2024-09-10 NOTE — Progress Notes (Signed)
 This patient returns to my office for at risk foot care.  This patient requires this care by a professional since this patient will be at risk due to having diabetes. 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 This patient is unable to cut nails herself since the patient cannot reach her nails.These nails are painful walking and wearing shoes.  This patient presents for at risk foot care today.  General Appearance  Alert, conversant and in no acute stress.  Vascular  Dorsalis pedis and posterior tibial  pulses are palpable  bilaterally.  Capillary return is within normal limits  bilaterally. Temperature is within normal limits  bilaterally.  Neurologic  Senn-Weinstein monofilament wire test within normal limits  bilaterally. Muscle power within normal limits bilaterally.  Nails Thick disfigured discolored nails with subungual debris  from hallux to fifth toes bilaterally. No evidence of bacterial infection or drainage bilaterally.  Orthopedic  No limitations of motion  feet .  No crepitus or effusions noted.  HAV  B/L.  Skin  normotropic skin with no porokeratosis noted bilaterally.  No signs of infections or ulcers noted.     Onychomycosis  Pain in right toes  Pain in left toes  Consent was obtained for treatment procedures.   Mechanical debridement of nails 1-5  bilaterally performed with a nail nipper.  Filed with dremel without incident.    Return office visit     3 months                 Told patient to return for periodic foot care and evaluation due to potential at risk complications.   Cordella Bold DPM

## 2024-09-12 ENCOUNTER — Ambulatory Visit (INDEPENDENT_AMBULATORY_CARE_PROVIDER_SITE_OTHER): Admitting: Ophthalmology

## 2024-09-12 ENCOUNTER — Encounter (INDEPENDENT_AMBULATORY_CARE_PROVIDER_SITE_OTHER): Payer: Self-pay | Admitting: Ophthalmology

## 2024-09-12 DIAGNOSIS — I10 Essential (primary) hypertension: Secondary | ICD-10-CM

## 2024-09-12 DIAGNOSIS — H4312 Vitreous hemorrhage, left eye: Secondary | ICD-10-CM

## 2024-09-12 DIAGNOSIS — Z961 Presence of intraocular lens: Secondary | ICD-10-CM

## 2024-09-12 DIAGNOSIS — H35033 Hypertensive retinopathy, bilateral: Secondary | ICD-10-CM

## 2024-09-12 DIAGNOSIS — H3342 Traction detachment of retina, left eye: Secondary | ICD-10-CM

## 2024-09-12 DIAGNOSIS — Z794 Long term (current) use of insulin: Secondary | ICD-10-CM

## 2024-09-12 DIAGNOSIS — E113513 Type 2 diabetes mellitus with proliferative diabetic retinopathy with macular edema, bilateral: Secondary | ICD-10-CM | POA: Diagnosis not present

## 2024-09-12 DIAGNOSIS — H4313 Vitreous hemorrhage, bilateral: Secondary | ICD-10-CM

## 2024-09-12 DIAGNOSIS — Z7984 Long term (current) use of oral hypoglycemic drugs: Secondary | ICD-10-CM

## 2024-09-12 DIAGNOSIS — H4311 Vitreous hemorrhage, right eye: Secondary | ICD-10-CM

## 2024-09-12 DIAGNOSIS — H25811 Combined forms of age-related cataract, right eye: Secondary | ICD-10-CM

## 2024-09-12 DIAGNOSIS — E119 Type 2 diabetes mellitus without complications: Secondary | ICD-10-CM

## 2024-09-12 DIAGNOSIS — H40052 Ocular hypertension, left eye: Secondary | ICD-10-CM

## 2024-09-12 DIAGNOSIS — H33311 Horseshoe tear of retina without detachment, right eye: Secondary | ICD-10-CM

## 2024-09-13 ENCOUNTER — Encounter (HOSPITAL_COMMUNITY): Payer: Self-pay

## 2024-09-13 ENCOUNTER — Other Ambulatory Visit (HOSPITAL_COMMUNITY): Payer: Self-pay

## 2024-09-13 MED ORDER — INSULIN GLARGINE SOLOSTAR 100 UNIT/ML ~~LOC~~ SOPN
18.0000 [IU] | PEN_INJECTOR | Freq: Every morning | SUBCUTANEOUS | 1 refills | Status: AC
  Filled 2024-12-01: qty 15, 83d supply, fill #0

## 2024-09-13 MED ORDER — FREESTYLE TEST VI STRP
1.0000 | ORAL_STRIP | Freq: Two times a day (BID) | 3 refills | Status: AC
  Filled 2024-09-13: qty 200, 90d supply, fill #0

## 2024-09-16 ENCOUNTER — Encounter (INDEPENDENT_AMBULATORY_CARE_PROVIDER_SITE_OTHER): Payer: Self-pay | Admitting: Ophthalmology

## 2024-09-19 ENCOUNTER — Other Ambulatory Visit (HOSPITAL_COMMUNITY): Payer: Self-pay

## 2024-09-20 ENCOUNTER — Other Ambulatory Visit (HOSPITAL_COMMUNITY): Payer: Self-pay

## 2024-09-21 ENCOUNTER — Other Ambulatory Visit (HOSPITAL_COMMUNITY): Payer: Self-pay

## 2024-09-27 ENCOUNTER — Encounter (INDEPENDENT_AMBULATORY_CARE_PROVIDER_SITE_OTHER): Payer: Self-pay

## 2024-09-28 ENCOUNTER — Other Ambulatory Visit (HOSPITAL_COMMUNITY): Payer: Self-pay

## 2024-10-08 ENCOUNTER — Other Ambulatory Visit (HOSPITAL_COMMUNITY): Payer: Self-pay

## 2024-10-08 ENCOUNTER — Other Ambulatory Visit (HOSPITAL_COMMUNITY): Payer: Self-pay | Admitting: Internal Medicine

## 2024-10-08 ENCOUNTER — Other Ambulatory Visit: Payer: Self-pay

## 2024-10-08 DIAGNOSIS — I10 Essential (primary) hypertension: Secondary | ICD-10-CM

## 2024-10-08 MED ORDER — CARVEDILOL 6.25 MG PO TABS
6.2500 mg | ORAL_TABLET | Freq: Two times a day (BID) | ORAL | 3 refills | Status: AC
  Filled 2024-10-08: qty 180, 90d supply, fill #0

## 2024-10-15 ENCOUNTER — Other Ambulatory Visit (HOSPITAL_COMMUNITY): Payer: Self-pay

## 2024-10-30 ENCOUNTER — Other Ambulatory Visit (HOSPITAL_COMMUNITY): Payer: Self-pay

## 2024-10-30 ENCOUNTER — Other Ambulatory Visit: Payer: Self-pay

## 2024-11-06 ENCOUNTER — Other Ambulatory Visit (HOSPITAL_COMMUNITY): Payer: Self-pay | Admitting: Internal Medicine

## 2024-11-06 ENCOUNTER — Other Ambulatory Visit (HOSPITAL_COMMUNITY): Payer: Self-pay

## 2024-11-06 MED ORDER — SACUBITRIL-VALSARTAN 24-26 MG PO TABS
1.0000 | ORAL_TABLET | Freq: Two times a day (BID) | ORAL | 1 refills | Status: AC
  Filled 2024-11-06: qty 60, 30d supply, fill #0
  Filled 2024-12-31: qty 60, 30d supply, fill #1

## 2024-11-28 LAB — BASIC METABOLIC PANEL WITH GFR
BUN: 66 — AB (ref 4–21)
CO2: 21 (ref 13–22)
Chloride: 109 — AB (ref 99–108)
Creatinine: 2.9 — AB (ref 0.5–1.1)
Glucose: 114
Potassium: 4.2 meq/L (ref 3.5–5.1)
Sodium: 144 (ref 137–147)

## 2024-11-28 LAB — COMPREHENSIVE METABOLIC PANEL WITH GFR
Albumin: 4.2 (ref 3.5–5.0)
Calcium: 9.3 (ref 8.7–10.7)
eGFR: 19

## 2024-11-28 LAB — PROTEIN / CREATININE RATIO, URINE
Albumin, U: 40.9
Creatinine, Urine: 28.5

## 2024-11-28 LAB — MICROALBUMIN / CREATININE URINE RATIO: Microalb Creat Ratio: 144

## 2024-11-28 LAB — CBC AND DIFFERENTIAL: Hemoglobin: 12 (ref 12.0–16.0)

## 2024-11-30 ENCOUNTER — Ambulatory Visit

## 2024-11-30 ENCOUNTER — Ambulatory Visit (HOSPITAL_COMMUNITY)
Admission: RE | Admit: 2024-11-30 | Discharge: 2024-11-30 | Attending: Physician Assistant | Admitting: Physician Assistant

## 2024-11-30 VITALS — BP 125/83 | HR 71 | Temp 98.3°F | Wt 250.5 lb

## 2024-11-30 DIAGNOSIS — M7989 Other specified soft tissue disorders: Secondary | ICD-10-CM

## 2024-11-30 DIAGNOSIS — I8002 Phlebitis and thrombophlebitis of superficial vessels of left lower extremity: Secondary | ICD-10-CM | POA: Diagnosis present

## 2024-11-30 NOTE — Progress Notes (Signed)
 Office Note     CC:  follow up Requesting Provider:  Duwaine Annabella SAILOR, FNP  HPI: Adrienne Lane is a 58 y.o. ([birth year]) female who presents for evaluation of left lower extremity edema and painful area in left medial lower leg.  She describes a palpable cord in her left medial lower leg that became very painful in March of this year.  The area in question has decreased in size and is no longer painful.  She denies any history of DVT, venous ulcerations, trauma, or prior vascular interventions.  She wears compression on a regular basis and focuses on periodic leg elevation during the day which she believes really helps manage her edema.  She denies tobacco use.  She is on Eliquis  for atrial fibrillation.   Past Medical History:  Diagnosis Date   Atrial fibrillation with RVR (HCC)    Bigeminy 01/2020   Cataract    CHF (congestive heart failure) (HCC) 10/20/2019   CKD (chronic kidney disease), stage IV (HCC)    Diabetes mellitus without complication (HCC)    Diabetic retinopathy (HCC)    DOE (dyspnea on exertion)    walking upstairs or up hill resolves in one minute   Dysrhythmia    a-fib - on pradaxa    Fibroids    History of kidney stones    History of recent blood transfusion 02/26/2020   Hypertension    Hypertensive retinopathy    Iron deficiency anemia    Non-ischemic cardiomyopathy (HCC)    tachycardia induced   Obese    Peripheral edema    Premature ventricular contractions (PVCs) (VPCs)    Sleep apnea    does not use cpap   Stroke (HCC) 12/2020   Umbilical hernia    Wears glasses     Past Surgical History:  Procedure Laterality Date   CATARACT EXTRACTION     CHOLECYSTECTOMY     CYSTOSCOPY W/ URETERAL STENT PLACEMENT Bilateral 05/24/2020   Procedure: CYSTOSCOPY WITH RETROGRADE PYELOGRAM/URETERAL STENT PLACEMENT;  Surgeon: Elisabeth Valli BIRCH, MD;  Location: [facility];  Service: Urology;  Laterality: Bilateral;   CYSTOSCOPY/RETROGRADE/URETEROSCOPY Bilateral 04/03/2020    Procedure: CYSTOSCOPY/RETROGRADE/URETEROSCOPY;  Surgeon: Cam Morene ORN, MD;  Location: [facility];  Service: Urology;  Laterality: Bilateral;   EYE SURGERY Left 08/2021   cataract removed   HYSTERECTOMY ABDOMINAL WITH SALPINGO-OOPHORECTOMY  07/21/2020   Procedure: HYSTERECTOMY ABDOMINAL WITH BILATERAL SALPINGO-OOPHORECTOMY;  Surgeon: Bettina Muskrat, MD;  Location: [facility] OR;  Service: Gynecology;;   INJECTION OF SILICONE OIL Left 10/08/2021   Procedure: INJECTION OF SILICONE OIL;  Surgeon: Valdemar Rogue, MD;  Location: [facility] OR;  Service: Ophthalmology;  Laterality: Left;   IR FLUORO GUIDE CV LINE RIGHT  02/21/2020   IR US  GUIDE VASC ACCESS RIGHT  02/21/2020   MEMBRANE PEEL Left 10/08/2021   Procedure: MEMBRANE PEEL;  Surgeon: Valdemar Rogue, MD;  Location: [facility] OR;  Service: Ophthalmology;  Laterality: Left;   NEPHROLITHOTOMY Left 02/14/2020   Procedure: NEPHROLITHOTOMY PERCUTANEOUS/ SURGEON ACCESS/ LEFT PERCUTANEOUS NEPHROSTOMY TUBE PLACEMENT;  Surgeon: Cam Morene ORN, MD;  Location: [facility];  Service: Urology;  Laterality: Left;   NEPHROLITHOTOMY Left 02/21/2020   Procedure: NEPHROLITHOTOMY PERCUTANEOUS SECOND LOOK;  Surgeon: Cam Morene ORN, MD;  Location: [facility];  Service: Urology;  Laterality: Left;   NEPHROLITHOTOMY Left 02/26/2020   Procedure: NEPHROLITHOTOMY PERCUTANEOUS;  Surgeon: Devere Lonni Righter, MD;  Location: [facility];  Service: Urology;  Laterality: Left;  NEED 150 MIN   NEPHROLITHOTOMY Right 03/24/2020   Procedure: NEPHROLITHOTOMY PERCUTANEOUS WITH ACCESS  LEFT STENT REMOVAL;  Surgeon: Cam Morene ORN, MD;  Location: [facility];  Service: Urology;  Laterality: Right;   PARS PLANA VITRECTOMY Left 10/08/2021   Procedure: PARS PLANA VITRECTOMY WITH 25 GAUGE;  Surgeon: Valdemar Rogue, MD;  Location: [facility] OR;  Service: Ophthalmology;  Laterality: Left;   PHOTOCOAGULATION WITH LASER Left 10/08/2021   Procedure: PHOTOCOAGULATION WITH LASER;  Surgeon: Valdemar Rogue, MD;  Location: [facility]  OR;  Service: Ophthalmology;  Laterality: Left;   RIGHT HEART CATH N/A 11/09/2019   Procedure: RIGHT HEART CATH;  Surgeon: Cherrie Toribio SAUNDERS, MD;  Location: [facility];  Service: Cardiovascular;  Laterality: N/A;   WISDOM TOOTH EXTRACTION      Social History   Socioeconomic History   Marital status: Married    Spouse name: Not on file   Number of children: 0   Years of education: Not on file   Highest education level: Associate degree: occupational, scientist, product/process development, or vocational program  Occupational History   Occupation: unemployed  Tobacco Use   Smoking status: Never   Smokeless tobacco: Never  Vaping Use   Vaping status: Never Used  Substance and Sexual Activity   Alcohol use: Never   Drug use: Never   Sexual activity: Yes    Birth control/protection: Surgical    Comment: Hysterectomy  Other Topics Concern   Not on file  Social History Narrative   Not on file   Social Drivers of Health   Tobacco Use: Low Risk (11/30/2024)   Patient History    Smoking Tobacco Use: Never    Smokeless Tobacco Use: Never    Passive Exposure: Not on file  Financial Resource Strain: Not at Risk (02/24/2024)   Received from [employer]    How hard is it for you to pay for the very basics like food, housing, heating, medical care, and medications?: 1  Food Insecurity: Not at Risk (02/24/2024)   Received from [facility] Insecurity    Within the past 12 months, you worried that your food would run out before you got money to buy more.: 1  Transportation Needs: Not at Risk (02/24/2024)   Received from [facility] Needs    In the past 12 months, has lack of transportation kept you from medical appointments, meetings, work or from getting things needed for daily living?: 1  Physical Activity: Not on File (04/08/2022)   Received from [facility]   Physical Activity    Physical Activity: 0  Stress: Not on File (04/08/2022)   Received from [facility]   Stress     Stress: 0  Social Connections: Not on File (09/02/2023)   Received from [facility]   Social Connections    Connectedness: 0  Intimate Partner Violence: Unknown (03/25/2022)   Received from [facility]   HITS    Physically Hurt: Not on file    Insult or Talk Down To: Not on file    Threaten Physical Harm: Not on file    Scream or Curse: Not on file  Depression (EYV7-0): Not on file  Alcohol Screen: Not on file  Housing: Low Risk (08/09/2022)   Housing    Last Housing Risk Score: 0  Utilities: Not on file  Health Literacy: Not on file    Family History  Problem Relation Age of Onset   Atrial fibrillation Mother    Hypertension Mother    Stroke Mother 72   Diabetes Mother    Diabetes Father  Hypertension Father    Diabetes Sister    Hypertension Sister    Diabetes Brother    Hypertension Brother    Diabetes Brother    Heart attack Brother    Stroke Maternal Grandmother 45    Current Outpatient Medications  Medication Sig Dispense Refill   acetaminophen  (TYLENOL ) 500 MG tablet Take 500 mg by mouth every 6 (six) hours as needed for moderate pain or headache.     amLODipine  (NORVASC ) 10 MG tablet Take 1 tablet (10 mg total) by mouth daily. 30 tablet 6   aspirin  EC 81 MG tablet Take 81 mg by mouth daily. Swallow whole.     atorvastatin  (LIPITOR ) 80 MG tablet Take 1 tablet (80 mg total) by mouth at bedtime. 90 tablet 3   carvedilol  (COREG ) 6.25 MG tablet Take 1 tablet (6.25 mg total) by mouth 2 (two) times daily with a meal. 180 tablet 3   Continuous Glucose Sensor (FREESTYLE LIBRE 2 SENSOR) MISC Combine applicator and sensor to apply to back of arm. Replace after 14 days. 6 each 3   doxycycline  (VIBRA -TABS) 100 MG tablet Take 1 Tablet by mouth 2 (two) times daily for 7 days 14 tablet 0   empagliflozin  (JARDIANCE ) 25 MG TABS tablet Take 1 tablet (25 mg total) by mouth daily. 90 tablet 1   empagliflozin  (JARDIANCE ) 25 MG TABS tablet Take 1 tablet by mouth daily.     furosemide   (LASIX ) 20 MG tablet Take 1 tablet (20 mg total) by mouth every other day.     furosemide  (LASIX ) 40 MG tablet Take 1 tablet (40 mg total) by mouth daily. 90 tablet 1   glucose blood (FREESTYLE TEST STRIPS) test strip Use 1 strip 3 (three) times daily 100 each 11   glucose blood (FREESTYLE TEST STRIPS) test strip Use 1 strip to test blood sugar levels 2 (two) times daily. 200 each 3   glucose blood (TRUE METRIX BLOOD GLUCOSE TEST) test strip Use to check fasting blood glucose 3 times daily 50 strip 6   hydrALAZINE  (APRESOLINE ) 100 MG tablet Take 1 tablet (100 mg total) by mouth 3 (three) times daily. 90 tablet 3   insulin  aspart (NOVOLOG ) 100 UNIT/ML injection Use as directed using sliding scale three times daily. 10 mL PRN   Insulin  Glargine Solostar (LANTUS ) 100 UNIT/ML Solostar Pen Inject 22 Units into the skin daily. 19.8 mL 1   Insulin  Glargine Solostar (LANTUS ) 100 UNIT/ML Solostar Pen Inject 18 Units into the skin every morning. 15 mL 1   Insulin  Pen Needle 31G X 5 MM MISC Inject 1 each into the skin daily. 100 each 3   Insulin  Syringe-Needle U-100 30G X 5/16 0.5 ML MISC Use as directed. 100 each 11   isosorbide  mononitrate (IMDUR ) 60 MG 24 hr tablet Take 1 tablet (60 mg total) by mouth daily. 30 tablet 3   L-LYSINE PO Take 1 tablet by mouth every morning.     Lancets (ONETOUCH ULTRASOFT) lancets Check CBG twice a day 60 each 5   Lancets MISC Check CBG twice a day     metoprolol  succinate (TOPROL -XL) 50 MG 24 hr tablet Take 50 mg by mouth daily.     mexiletine (MEXITIL ) 150 MG capsule Take 2 capsules (300 mg total) by mouth every 12 (twelve) hours. 120 capsule 6   MILK THISTLE EXTRACT PO Take 1 tablet by mouth daily.     Misc. Devices (ROLLATOR ULTRA-LIGHT) MISC Use to support gait and prevent falls  Multiple Vitamin (MULTI-VITAMIN) tablet Take 1 tablet by mouth daily.     Multiple Vitamins-Minerals (WOMENS MULTI PO) Take 1 tablet by mouth daily.     Multiple Vitamins-Minerals (ZINC  PO) Take 1 capsule by mouth daily.     NIFEdipine  (PROCARDIA  XL/NIFEDICAL XL) 60 MG 24 hr tablet Take 1 tablet (60 mg total) by mouth daily. 90 tablet 3   rivaroxaban  (XARELTO ) 20 MG TABS tablet Take 1 tablet (20 mg total) by mouth daily with supper. 30 tablet 11   sacubitril -valsartan  (ENTRESTO ) 24-26 MG Take 1 tablet by mouth 2 (two) times daily. PLEASE SCHEDULE APPOINTMENT FOR MORE REFILLS [id number] OPTION 2 180 tablet 1   spironolactone  (ALDACTONE ) 25 MG tablet Take 1 tablet (25 mg total) by mouth daily. 90 tablet 3   tirzepatide  (MOUNJARO ) 2.5 MG/0.5ML Pen Inject 2.5 mg into the skin once a week. 2 mL 0   TRUEplus Lancets 28G MISC Use to check fasting blood glucose 3 times daily 100 each 3   Turmeric (QC TUMERIC COMPLEX PO) Take 1 capsule by mouth daily.     Turmeric 400 MG CAPS 1 capsule.     UNABLE TO FIND Med Name: Milk thistle. Patient takes 1 capsule by mouth in the evening.     Vitamin D -Vitamin K (VITAMIN K2-VITAMIN D3 PO) Take 1 tablet by mouth daily.     No current facility-administered medications for this visit.    Allergies[1]   REVIEW OF SYSTEMS:  Negative unless noted in HPI [X]  denotes positive finding, [ ]  denotes negative finding Cardiac  Comments:  Chest pain or chest pressure:    Shortness of breath upon exertion:    Short of breath when lying flat:    Irregular heart rhythm:        Vascular    Pain in calf, thigh, or hip brought on by ambulation:    Pain in feet at night that wakes you up from your sleep:     Blood clot in your veins:    Leg swelling:         Pulmonary    Oxygen at home:    Productive cough:     Wheezing:         Neurologic    Sudden weakness in arms or legs:     Sudden numbness in arms or legs:     Sudden onset of difficulty speaking or slurred speech:    Temporary loss of vision in one eye:     Problems with dizziness:         Gastrointestinal    Blood in stool:     Vomited blood:         Genitourinary    Burning when  urinating:     Blood in urine:        Psychiatric    Major depression:         Hematologic    Bleeding problems:    Problems with blood clotting too easily:        Skin    Rashes or ulcers:        Constitutional    Fever or chills:      PHYSICAL EXAMINATION:  Vitals:   11/30/24 1118  BP: 125/83  Pulse: 71  Temp: 98.3 F (36.8 C)  TempSrc: Temporal  Weight: 250 lb 8 oz (113.6 kg)    General:  WDWN in NAD; vital signs documented above Gait: Not observed HENT: WNL, normocephalic Pulmonary: normal non-labored breathing Cardiac: regular HR Abdomen: soft,  NT, no masses Skin: without rashes Vascular Exam/Pulses: Palpable DP pulses bilaterally Extremities: without ischemic changes, without Gangrene , without cellulitis; without open wounds; firm collection of varicosities in the left medial lower leg Musculoskeletal: no muscle wasting or atrophy  Neurologic: A&O X 3 Psychiatric:  The pt has Normal affect.   Non-Invasive Vascular Imaging:   Left lower extremity venous reflux study was negative for DVT  Negative for superficial venous reflux Negative for deep venous reflux    ASSESSMENT/PLAN:: 58 y.o. female here for evaluation of edema and tender area in her left medial lower leg  Ms. Cheese is a 58 year old female who was referred to clinic for evaluation of left lower extremity edema and tender to touch area in her left medial lower leg.  She describes a painful episode in this area in question which sounds like an episode of superficial thrombophlebitis.  Reassured her that this area will continue to heal over time.  Congratulated her on developing habits of regular compression sock use and periodic leg elevation.  She should continue these to help manage edema and help prevent superficial thrombophlebitis in the future.  Left lower extremity venous reflux study was negative for DVT.  Duplex was also negative for any deep or superficial venous reflux.  She would not be  a candidate for laser ablation therapy.  I also would not repeat reflux study unless she had a significant change in exam.  For now she will follow-up on an as-needed basis.   Donnice Sender, PA-C [facility] [phone]  Clinic MD:   [place]     [1]  Allergies Allergen Reactions   Adhesive [Tape]     Tears skin, can tolerate paper tape   Latex     Other Reaction(s) from [facility]: Hives, Itching

## 2024-11-30 NOTE — Patient Instructions (Signed)

## 2024-12-01 ENCOUNTER — Other Ambulatory Visit (HOSPITAL_COMMUNITY): Payer: Self-pay

## 2024-12-03 ENCOUNTER — Other Ambulatory Visit (HOSPITAL_COMMUNITY): Payer: Self-pay

## 2024-12-04 ENCOUNTER — Other Ambulatory Visit (HOSPITAL_COMMUNITY): Payer: Self-pay

## 2024-12-05 ENCOUNTER — Other Ambulatory Visit (HOSPITAL_COMMUNITY): Payer: Self-pay

## 2024-12-08 ENCOUNTER — Other Ambulatory Visit (HOSPITAL_COMMUNITY): Payer: Self-pay

## 2024-12-10 ENCOUNTER — Ambulatory Visit: Admitting: Podiatry

## 2024-12-14 ENCOUNTER — Other Ambulatory Visit (HOSPITAL_COMMUNITY): Payer: Self-pay

## 2024-12-14 MED ORDER — JARDIANCE 25 MG PO TABS
25.0000 mg | ORAL_TABLET | Freq: Every day | ORAL | 1 refills | Status: AC
  Filled 2024-12-14: qty 90, 90d supply, fill #0

## 2024-12-17 ENCOUNTER — Other Ambulatory Visit (HOSPITAL_COMMUNITY): Payer: Self-pay

## 2024-12-17 MED ORDER — FUROSEMIDE 40 MG PO TABS
40.0000 mg | ORAL_TABLET | Freq: Every day | ORAL | 1 refills | Status: AC
  Filled 2024-12-17: qty 90, 90d supply, fill #0

## 2024-12-18 ENCOUNTER — Encounter (INDEPENDENT_AMBULATORY_CARE_PROVIDER_SITE_OTHER): Payer: Self-pay | Admitting: Adult Health

## 2024-12-18 ENCOUNTER — Ambulatory Visit (INDEPENDENT_AMBULATORY_CARE_PROVIDER_SITE_OTHER): Admitting: Adult Health

## 2024-12-18 VITALS — BP 122/68 | HR 60 | Temp 97.7°F | Ht 66.5 in | Wt 247.0 lb

## 2024-12-18 DIAGNOSIS — I10 Essential (primary) hypertension: Secondary | ICD-10-CM

## 2024-12-18 DIAGNOSIS — I5022 Chronic systolic (congestive) heart failure: Secondary | ICD-10-CM

## 2024-12-18 DIAGNOSIS — I110 Hypertensive heart disease with heart failure: Secondary | ICD-10-CM

## 2024-12-18 DIAGNOSIS — E1142 Type 2 diabetes mellitus with diabetic polyneuropathy: Secondary | ICD-10-CM

## 2024-12-18 DIAGNOSIS — E782 Mixed hyperlipidemia: Secondary | ICD-10-CM

## 2024-12-18 DIAGNOSIS — E669 Obesity, unspecified: Secondary | ICD-10-CM

## 2024-12-18 DIAGNOSIS — Z794 Long term (current) use of insulin: Secondary | ICD-10-CM

## 2024-12-18 DIAGNOSIS — Z7985 Long-term (current) use of injectable non-insulin antidiabetic drugs: Secondary | ICD-10-CM | POA: Diagnosis not present

## 2024-12-18 DIAGNOSIS — Z6839 Body mass index (BMI) 39.0-39.9, adult: Secondary | ICD-10-CM

## 2024-12-18 DIAGNOSIS — E119 Type 2 diabetes mellitus without complications: Secondary | ICD-10-CM

## 2024-12-18 DIAGNOSIS — E559 Vitamin D deficiency, unspecified: Secondary | ICD-10-CM

## 2024-12-18 DIAGNOSIS — I4891 Unspecified atrial fibrillation: Secondary | ICD-10-CM

## 2024-12-18 NOTE — Progress Notes (Signed)
 " Office: [phone]  /  Fax: [phone]   Initial Visit    Adrienne Lane was seen in clinic today to evaluate for obesity. She is interested in losing weight to improve overall health and reduce the risk of weight related complications. She presents today to review program treatment options, initial physical assessment, and evaluation.     She was referred by: Friend or Family  When asked what else they would like to accomplish? She states: Adopt a healthier eating pattern and lifestyle, Improve energy levels and physical activity, Improve existing medical conditions, Reduce number of medications, Improve quality of life, and Feel Better.  When asked how has your weight affected you? She states: Contributed to medical problems, Contributed to orthopedic problems or mobility issues, Having fatigue, Having poor endurance, and Problems with eating patterns  Weight history: Elevated BMI since age 50  Highest weight: 341 lbs  Some associated conditions: Hypertension, Diabetes, and Heart Failure  Contributing factors: family history of obesity, disruption of circadian rhythm / sleep disordered breathing, consumption of processed foods, use of obesogenic medications: Beta-blockers and Obesogenic diabetes medications, and reduced physical activity  Weight promoting medications identified: Beta-blockers and Obesogenic diabetes medications  Prior weight loss attempts: Low Carb and Low Na+  Current nutrition plan: Low-carb and Other: Low Na+  Current level of physical activity: Walking 30 minutes, twice a week  Current or previous pharmacotherapy: GLP-1 + GIP  Response to medication: Currently on Mounjaro  2.5mg - unable to increase due to GI upset   Past medical history includes:   Past Medical History:  Diagnosis Date   Atrial fibrillation with RVR (HCC)    Bigeminy 01/2020   Cataract    CHF (congestive heart failure) (HCC) 10/20/2019   CKD (chronic kidney disease), stage IV  (HCC)    Diabetes mellitus without complication (HCC)    Diabetic retinopathy (HCC)    DOE (dyspnea on exertion)    walking upstairs or up hill resolves in one minute   Dysrhythmia    a-fib - on pradaxa    Fibroids    History of kidney stones    History of recent blood transfusion 02/26/2020   Hypertension    Hypertensive retinopathy    Iron deficiency anemia    Non-ischemic cardiomyopathy (HCC)    tachycardia induced   Obese    Peripheral edema    Premature ventricular contractions (PVCs) (VPCs)    Sleep apnea    does not use cpap   Stroke (HCC) 12/2020   Umbilical hernia    Wears glasses      Objective    BP 122/68   Pulse 60   Temp 97.7 F (36.5 C)   Ht 5' 6.5 (1.689 m)   Wt 247 lb (112 kg)   LMP 05/12/2020   SpO2 98%   BMI 39.27 kg/m  She was weighed on the bioimpedance scale: Body mass index is 39.27 kg/m.  Body Fat%:51.7, Visceral Fat Rating:16, Weight trend over the last 12 months: Increasing  General:  Alert, oriented and cooperative. Patient is in no acute distress.  Respiratory: Normal respiratory effort, no problems with respiration noted   Gait: able to ambulate independently  Mental Status: Normal mood and affect. Normal behavior. Normal judgment and thought content.   DIAGNOSTIC DATA REVIEWED:  BMET    Component Value Date/Time   NA 142 05/23/2024 1159   NA 147 (H) 01/20/2021 1041   K 4.1 05/23/2024 1159   CL 112 (H) 05/23/2024 1159   CO2 21 (L)  05/23/2024 1159   GLUCOSE 156 (H) 05/23/2024 1159   BUN 40 (H) 05/23/2024 1159   BUN 13 01/20/2021 1041   CREATININE 2.24 (H) 05/23/2024 1159   CALCIUM  9.3 05/23/2024 1159   GFRNONAA 25 (L) 05/23/2024 1159   GFRAA 49 (L) 01/20/2021 1041   Lab Results  Component Value Date   HGBA1C 8.0 (A) 05/22/2021   HGBA1C 11.7 (H) 10/20/2019   No results found for: INSULIN  CBC    Component Value Date/Time   WBC 5.7 03/21/2024 1803   RBC 4.73 03/21/2024 1803   HGB 13.4 03/21/2024 1803   HGB 11.9  01/20/2021 1041   HCT 41.9 03/21/2024 1803   HCT 35.8 01/20/2021 1041   PLT 225 03/21/2024 1803   PLT 198 01/20/2021 1041   MCV 88.6 03/21/2024 1803   MCV 87 01/20/2021 1041   MCH 28.3 03/21/2024 1803   MCHC 32.0 03/21/2024 1803   RDW 14.5 03/21/2024 1803   RDW 14.4 01/20/2021 1041   Iron/TIBC/Ferritin/ %Sat    Component Value Date/Time   IRON 44 10/21/2019 0703   TIBC 355 10/21/2019 0703   FERRITIN 20 10/21/2019 0703   IRONPCTSAT 12 10/21/2019 0703   Lipid Panel     Component Value Date/Time   CHOL 260 (H) 12/25/2020 0202   CHOL 283 (H) 10/23/2020 1109   TRIG 141 12/25/2020 0202   HDL 59 12/25/2020 0202   HDL 75 10/23/2020 1109   CHOLHDL 4.4 12/25/2020 0202   VLDL 28 12/25/2020 0202   LDLCALC 173 (H) 12/25/2020 0202   LDLCALC 187 (H) 10/23/2020 1109   Hepatic Function Panel     Component Value Date/Time   PROT 7.1 04/22/2021 1153   PROT 7.2 10/23/2020 1109   ALBUMIN  3.3 (L) 04/22/2021 1153   ALBUMIN  3.9 10/23/2020 1109   AST 25 04/22/2021 1153   ALT 21 04/22/2021 1153   ALKPHOS 106 04/22/2021 1153   BILITOT 0.1 (L) 04/22/2021 1153   BILITOT 0.4 10/23/2020 1109      Component Value Date/Time   TSH 1.712 12/25/2020 0202   TSH 1.490 10/23/2020 1109     Assessment and Plan   Chronic systolic congestive heart failure (HCC)  Atrial fibrillation with RVR (HCC)  Essential hypertension  Diabetes mellitus type 2, insulin  dependent (HCC)  Poorly controlled type 2 diabetes mellitus with peripheral neuropathy (HCC)  Mixed hyperlipidemia  Vitamin D  deficiency    Assessment and Plan         ESTABLISH WITH [facility]WW   Obesity Treatment / Action Plan:  Patient will work on garnering support from family and friends to begin weight loss journey. Will work on eliminating or reducing the presence of highly palatable, calorie dense foods in the home. Will complete provided nutritional and psychosocial assessment questionnaire before the next appointment. Will be  scheduled for indirect calorimetry to determine resting energy expenditure in a fasting state.  This will allow us  to create a reduced calorie, high-protein meal plan to promote loss of fat mass while preserving muscle mass. Counseled on the health benefits of losing 5%-15% of total body weight. Was counseled on nutritional approaches to weight loss and benefits of reducing processed foods and consuming plant-based foods and high quality protein as part of nutritional weight management. Was counseled on pharmacotherapy and role as an adjunct in weight management.   Obesity Education Performed Today:  She was weighed on the bioimpedance scale and results were discussed and documented in the synopsis.  We discussed obesity as a disease and  the importance of a more detailed evaluation of all the factors contributing to the disease.  We discussed the importance of long term lifestyle changes which include nutrition, exercise and behavioral modifications as well as the importance of customizing this to her specific health and social needs.  We discussed the benefits of reaching a healthier weight to alleviate the symptoms of existing conditions and reduce the risks of the biomechanical, metabolic and psychological effects of obesity.  We reviewed the four pillars of obesity medicine and importance of using a multimodal approach.  We reviewed the basic principles in weight management.   Adrienne Lane appears to be in the action stage of change and states they are ready to start intensive lifestyle modifications and behavioral modifications.  I have spent 30 minutes in the care of the patient today including: 4 minutes before the visit reviewing and preparing the chart. 20 minutes face-to-face assessing and reviewing listed medical problems as outlined in obesity care plan, providing nutritional and behavioral counseling on topics outlined in the obesity care plan, independently interpreting test  results and goals of care, as described in assessment and plan, and reviewing and discussing biometric information and progress 6 minutes after the visit updating chart and documentation of encounter.  Reviewed by clinician on day of visit: allergies, medications, problem list, medical history, surgical history, family history, social history, and previous encounter notes pertinent to obesity diagnosis.  Adrienne Lane d. Che Rachal, NP-C "

## 2024-12-24 ENCOUNTER — Other Ambulatory Visit (HOSPITAL_COMMUNITY): Payer: Self-pay

## 2024-12-25 ENCOUNTER — Other Ambulatory Visit (HOSPITAL_COMMUNITY): Payer: Self-pay

## 2024-12-25 DIAGNOSIS — Z0289 Encounter for other administrative examinations: Secondary | ICD-10-CM

## 2024-12-31 ENCOUNTER — Institutional Professional Consult (permissible substitution) (INDEPENDENT_AMBULATORY_CARE_PROVIDER_SITE_OTHER): Admitting: Nurse Practitioner

## 2025-01-03 ENCOUNTER — Other Ambulatory Visit (HOSPITAL_COMMUNITY): Payer: Self-pay

## 2025-01-09 ENCOUNTER — Encounter (INDEPENDENT_AMBULATORY_CARE_PROVIDER_SITE_OTHER): Payer: Self-pay | Admitting: Family Medicine

## 2025-01-09 ENCOUNTER — Ambulatory Visit (INDEPENDENT_AMBULATORY_CARE_PROVIDER_SITE_OTHER): Admitting: Family Medicine

## 2025-01-09 VITALS — BP 159/91 | HR 67 | Temp 98.2°F | Ht 66.5 in | Wt 246.0 lb

## 2025-01-09 DIAGNOSIS — I4891 Unspecified atrial fibrillation: Secondary | ICD-10-CM

## 2025-01-09 DIAGNOSIS — R5383 Other fatigue: Secondary | ICD-10-CM | POA: Diagnosis not present

## 2025-01-09 DIAGNOSIS — E119 Type 2 diabetes mellitus without complications: Secondary | ICD-10-CM

## 2025-01-09 DIAGNOSIS — I5022 Chronic systolic (congestive) heart failure: Secondary | ICD-10-CM

## 2025-01-09 DIAGNOSIS — Z6839 Body mass index (BMI) 39.0-39.9, adult: Secondary | ICD-10-CM

## 2025-01-09 DIAGNOSIS — E559 Vitamin D deficiency, unspecified: Secondary | ICD-10-CM | POA: Diagnosis not present

## 2025-01-09 DIAGNOSIS — I130 Hypertensive heart and chronic kidney disease with heart failure and stage 1 through stage 4 chronic kidney disease, or unspecified chronic kidney disease: Secondary | ICD-10-CM | POA: Diagnosis not present

## 2025-01-09 DIAGNOSIS — E782 Mixed hyperlipidemia: Secondary | ICD-10-CM

## 2025-01-09 DIAGNOSIS — Z1331 Encounter for screening for depression: Secondary | ICD-10-CM

## 2025-01-09 DIAGNOSIS — R0602 Shortness of breath: Secondary | ICD-10-CM

## 2025-01-09 DIAGNOSIS — I10 Essential (primary) hypertension: Secondary | ICD-10-CM

## 2025-01-09 DIAGNOSIS — E669 Obesity, unspecified: Secondary | ICD-10-CM

## 2025-01-09 DIAGNOSIS — N184 Chronic kidney disease, stage 4 (severe): Secondary | ICD-10-CM

## 2025-01-09 NOTE — Progress Notes (Signed)
 "  Office: [phone]  /  Fax: [phone]  WEIGHT SUMMARY AND BIOMETRICS  Anthropometric Measurements Height: 5' 6.5 (1.689 m) Weight: 246 lb (111.6 kg) BMI (Calculated): 39.12 Starting Weight: 246 lb Peak Weight: 341 lb Waist Measurement : 56 inches   Body Composition  Body Fat %: 51.1 % Fat Mass (lbs): 126 lbs Muscle Mass (lbs): 114.6 lbs Total Body Water  (lbs): 94 lbs Visceral Fat Rating : 15   Other Clinical Data RMR: 1411 Fasting: yes Labs: yes Today's Visit #: 1 Starting Date: 01/09/25    Chief Complaint: OBESITY   History of Present Illness Adrienne Lane is a 59 year old female with obesity who presents for obesity management and fatigue evaluation.  She is starting the workup for obesity management. Her weight has fluctuated significantly, reaching up to 360 pounds in the past. Currently, she weighs 247 pounds and aims to reach 180 pounds within a year. She has not undergone weight loss surgery but has considered it to help control her glucose levels. Her diet includes eating out and fast food four times a week. She does most of the grocery shopping and cooking. She enjoys vegetables but struggles with portion control and emotional eating, often eating quickly and while distracted. She previously lost weight using the Fat Smash Diet, which she found effective.  She experiences fatigue and low energy levels, which she associates with weight gain. She has shortness of breath with exertion, worsening with weight gain. She is trying to exercise twice a week, walking and stretching for about 20 minutes each session. She generally gets only five hours of sleep per night and has a positive Epworth sleep score of 11.  Her medical history includes type 2 diabetes, hypertension, congestive heart failure, chronic kidney disease, hyperlipidemia, and atrial fibrillation. She was diagnosed with atrial fibrillation in 2020, which is managed with medication as it is not  constant. She is concerned about her chronic kidney disease, noting a change from stage 3 to stage 4, and is interested in dietary guidance to manage her kidney health.  She has a positive PHQ-9 score of 5, indicating mild depressive symptoms. Her EKG shows a heart rate of 64 with normal sinus rhythm and two PVCs. An indirect calorimeter test today showed a low resting energy expenditure of 1411, with a basal metabolic rate of 8268. She uses [employer] for energy and drinks regular Powerade.      PHYSICAL EXAM:  Blood pressure (!) 159/91, pulse 67, temperature 98.2 F (36.8 C), height 5' 6.5 (1.689 m), weight 246 lb (111.6 kg), last menstrual period 05/12/2020, SpO2 97%. Body mass index is 39.11 kg/m.  DIAGNOSTIC DATA REVIEWED BY MYSELF TODAY:  BMET    Component Value Date/Time   NA 142 05/23/2024 1159   NA 147 (H) 01/20/2021 1041   K 4.1 05/23/2024 1159   CL 112 (H) 05/23/2024 1159   CO2 21 (L) 05/23/2024 1159   GLUCOSE 156 (H) 05/23/2024 1159   BUN 40 (H) 05/23/2024 1159   BUN 13 01/20/2021 1041   CREATININE 2.24 (H) 05/23/2024 1159   CALCIUM  9.3 05/23/2024 1159   GFRNONAA 25 (L) 05/23/2024 1159   GFRAA 49 (L) 01/20/2021 1041   Lab Results  Component Value Date   HGBA1C 8.0 (A) 05/22/2021   HGBA1C 11.7 (H) 10/20/2019   No results found for: INSULIN  Lab Results  Component Value Date   TSH 1.712 12/25/2020   CBC    Component Value Date/Time   WBC 5.7 03/21/2024 1803  RBC 4.73 03/21/2024 1803   HGB 13.4 03/21/2024 1803   HGB 11.9 01/20/2021 1041   HCT 41.9 03/21/2024 1803   HCT 35.8 01/20/2021 1041   PLT 225 03/21/2024 1803   PLT 198 01/20/2021 1041   MCV 88.6 03/21/2024 1803   MCV 87 01/20/2021 1041   MCH 28.3 03/21/2024 1803   MCHC 32.0 03/21/2024 1803   RDW 14.5 03/21/2024 1803   RDW 14.4 01/20/2021 1041   Iron Studies    Component Value Date/Time   IRON 44 10/21/2019 0703   TIBC 355 10/21/2019 0703   FERRITIN 20 10/21/2019 0703   IRONPCTSAT 12  10/21/2019 0703   Lipid Panel     Component Value Date/Time   CHOL 260 (H) 12/25/2020 0202   CHOL 283 (H) 10/23/2020 1109   TRIG 141 12/25/2020 0202   HDL 59 12/25/2020 0202   HDL 75 10/23/2020 1109   CHOLHDL 4.4 12/25/2020 0202   VLDL 28 12/25/2020 0202   LDLCALC 173 (H) 12/25/2020 0202   LDLCALC 187 (H) 10/23/2020 1109   Hepatic Function Panel     Component Value Date/Time   PROT 7.1 04/22/2021 1153   PROT 7.2 10/23/2020 1109   ALBUMIN  3.3 (L) 04/22/2021 1153   ALBUMIN  3.9 10/23/2020 1109   AST 25 04/22/2021 1153   ALT 21 04/22/2021 1153   ALKPHOS 106 04/22/2021 1153   BILITOT 0.1 (L) 04/22/2021 1153   BILITOT 0.4 10/23/2020 1109      Component Value Date/Time   TSH 1.712 12/25/2020 0202   TSH 1.490 10/23/2020 1109   Nutritional Lab Results  Component Value Date   VD25OH 21.6 (L) 10/23/2020     Assessment and Plan Assessment & Plan Obesity Current weight of 247 pounds, height of 5'6, and a BMI indicating obesity. Previous weight loss with the Fat Smash program, but weight regained. Current weight loss goal is 180 pounds within a year. Low resting energy expenditure and basal metabolic rate noted. Emotional eating behaviors and portion control issues present. No history of weight loss surgery. Positive Epworth sleep score and PHQ-9 indicating sleep and mood concerns. - Initiated Cat 2 structured eating plan with specific portion sizes and food choices for two weeks. - Provided food scale for accurate portion measurement. - Monitor hunger and cravings during the eating plan. - Will adjust eating plan based on feedback and lab results. - Encouraged continued exercise and weight loss efforts.  Type 2 diabetes mellitus Type 2 diabetes mellitus. Weight loss is a goal to help control glucose levels. - check labs today - Start Cat 2 eating plan - Stop drinking liquids with calories. - Check glucose levels and bring in results.   Essential hypertension Essential  hypertension, currently uncontrolled with blood pressure readings of 158/67 and 159/91. Weight loss and lifestyle modifications are part of the management plan. - Continue to monitor blood pressure regularly. - Implement lifestyle modifications to aid in blood pressure control.  Chronic systolic congestive heart failure Chronic systolic congestive heart failure, well-managed with annual cardiology follow-ups. Weight loss may improve heart failure symptoms. - Continue annual cardiology follow-ups. - Check labs today - Start Cat 2 low sodium eating plan - Stop drinking electrolyte drinks  Chronic kidney disease, stage 4 Chronic kidney disease, stage 4, with recent change from stage 3 to stage 4. Concerns about dietary management for kidney health. Previous labs from [facility] need to be reviewed. - Contacted [facility] to obtain previous lab results. - Will review and update dietary recommendations  based on kidney function. - Monitor kidney function regularly - Start low sodium Cat 2 eating plan - Stop electrolyte drinks.   Mixed hyperlipidemia Ready to work on diet exercise and weight loss to improve glucose levels - Check labs - Start low cholesterol Cat 2 plan  Atrial fibrillation Atrial fibrillation, diagnosed in 2020, currently well-controlled with medication. Weight loss may reduce frequency of episodes. - Continue current medication regimen for atrial fibrillation. - Encouraged weight loss to potentially reduce AFib episodes. - Will factor afib into medication options  Fatigue and shortness of breath on exertion Fatigue and shortness of breath on exertion, possibly related to obesity. Differential diagnosis includes thyroid dysfunction and anemia. Low resting energy expenditure noted. - Conducted workup for non-obesity causes such as thyroid dysfunction and anemia - Monitor fatigue and shortness of breath symptoms. - Work on healthy weight loss to improve symptoms        Patients who are on anti-obesity medications are counseled on the importance of maintaining healthy lifestyle habits, including balanced nutrition, regular physical activity, and behavioral modifications,  Medication is an adjunct to, not a replacement for, lifestyle changes and that the long-term success and weight maintenance depend on continued adherence to these strategies.   Bana was informed of the importance of frequent follow up visits to maximize her success with intensive lifestyle modifications for her obesity and obesity related health conditions as recommended by USPSTF and CMS guidelines  I personally spent a total of 40 minutes in the care of the patient today including preparing to see the patient, reviewing separately obtained history, performing a medically appropriate evaluation of current problems, placing orders in the EMR, documenting clinical information in the EMR, customized nutritional counseling for their specific health and social needs, independently interpreting results, explaining the pathophysiology of obesity and how it is significantly more complex than eat less and exercise more, and discussing emotional eating behaviors and how to make strategies to change these behaviors.  Louann Penton, MD   "

## 2025-01-10 LAB — COMPREHENSIVE METABOLIC PANEL WITH GFR
ALT: 10 IU/L (ref 0–32)
AST: 13 IU/L (ref 0–40)
Albumin: 4.3 g/dL (ref 3.8–4.9)
Alkaline Phosphatase: 95 IU/L (ref 49–135)
BUN/Creatinine Ratio: 17 (ref 9–23)
BUN: 38 mg/dL — ABNORMAL HIGH (ref 6–24)
Bilirubin Total: 0.4 mg/dL (ref 0.0–1.2)
CO2: 20 mmol/L (ref 20–29)
Calcium: 9.4 mg/dL (ref 8.7–10.2)
Chloride: 104 mmol/L (ref 96–106)
Creatinine, Ser: 2.21 mg/dL — ABNORMAL HIGH (ref 0.57–1.00)
Globulin, Total: 3 g/dL (ref 1.5–4.5)
Glucose: 84 mg/dL (ref 70–99)
Potassium: 4.7 mmol/L (ref 3.5–5.2)
Sodium: 141 mmol/L (ref 134–144)
Total Protein: 7.3 g/dL (ref 6.0–8.5)
eGFR: 26 mL/min/1.73 — ABNORMAL LOW

## 2025-01-10 LAB — CBC WITH DIFFERENTIAL/PLATELET
Basophils Absolute: 0 x10E3/uL (ref 0.0–0.2)
Basos: 1 %
EOS (ABSOLUTE): 0.1 x10E3/uL (ref 0.0–0.4)
Eos: 3 %
Hematocrit: 40.4 % (ref 34.0–46.6)
Hemoglobin: 12.8 g/dL (ref 11.1–15.9)
Immature Grans (Abs): 0 x10E3/uL (ref 0.0–0.1)
Immature Granulocytes: 0 %
Lymphocytes Absolute: 1.2 x10E3/uL (ref 0.7–3.1)
Lymphs: 22 %
MCH: 28.8 pg (ref 26.6–33.0)
MCHC: 31.7 g/dL (ref 31.5–35.7)
MCV: 91 fL (ref 79–97)
Monocytes Absolute: 0.4 x10E3/uL (ref 0.1–0.9)
Monocytes: 7 %
Neutrophils Absolute: 3.6 x10E3/uL (ref 1.4–7.0)
Neutrophils: 67 %
Platelets: 193 x10E3/uL (ref 150–450)
RBC: 4.44 x10E6/uL (ref 3.77–5.28)
RDW: 13.6 % (ref 11.7–15.4)
WBC: 5.3 x10E3/uL (ref 3.4–10.8)

## 2025-01-10 LAB — LIPID PANEL
Chol/HDL Ratio: 3.5 ratio (ref 0.0–4.4)
Cholesterol, Total: 234 mg/dL — ABNORMAL HIGH (ref 100–199)
HDL: 66 mg/dL
LDL Chol Calc (NIH): 154 mg/dL — ABNORMAL HIGH (ref 0–99)
Triglycerides: 82 mg/dL (ref 0–149)
VLDL Cholesterol Cal: 14 mg/dL (ref 5–40)

## 2025-01-10 LAB — TSH: TSH: 1.71 u[IU]/mL (ref 0.450–4.500)

## 2025-01-10 LAB — VITAMIN D 25 HYDROXY (VIT D DEFICIENCY, FRACTURES): Vit D, 25-Hydroxy: 40.8 ng/mL (ref 30.0–100.0)

## 2025-01-10 LAB — VITAMIN B12: Vitamin B-12: 632 pg/mL (ref 232–1245)

## 2025-01-10 LAB — HEMOGLOBIN A1C
Est. average glucose Bld gHb Est-mCnc: 143 mg/dL
Hgb A1c MFr Bld: 6.6 % — ABNORMAL HIGH (ref 4.8–5.6)

## 2025-01-10 LAB — T3: T3, Total: 122 ng/dL (ref 71–180)

## 2025-01-10 LAB — FOLATE: Folate: 12.3 ng/mL

## 2025-01-10 LAB — T4, FREE: Free T4: 1.31 ng/dL (ref 0.82–1.77)

## 2025-01-10 LAB — INSULIN, RANDOM: INSULIN: 9.9 u[IU]/mL (ref 2.6–24.9)

## 2025-01-17 ENCOUNTER — Telehealth (INDEPENDENT_AMBULATORY_CARE_PROVIDER_SITE_OTHER): Payer: Self-pay

## 2025-01-17 NOTE — Telephone Encounter (Signed)
 Call to [facility] to obtain most recent labs.  Spoke to Deaver, she stated she was sending the labs over right away.

## 2025-01-21 ENCOUNTER — Other Ambulatory Visit (INDEPENDENT_AMBULATORY_CARE_PROVIDER_SITE_OTHER): Payer: Self-pay

## 2025-01-21 NOTE — Telephone Encounter (Signed)
Labs received and abstracted.

## 2025-01-23 ENCOUNTER — Encounter (INDEPENDENT_AMBULATORY_CARE_PROVIDER_SITE_OTHER): Payer: Self-pay | Admitting: Family Medicine

## 2025-01-23 ENCOUNTER — Other Ambulatory Visit (HOSPITAL_COMMUNITY): Payer: Self-pay

## 2025-01-23 ENCOUNTER — Ambulatory Visit (INDEPENDENT_AMBULATORY_CARE_PROVIDER_SITE_OTHER): Admitting: Family Medicine

## 2025-01-23 VITALS — BP 135/71 | HR 66 | Temp 98.7°F | Ht 66.5 in | Wt 243.0 lb

## 2025-01-23 DIAGNOSIS — E1169 Type 2 diabetes mellitus with other specified complication: Secondary | ICD-10-CM

## 2025-01-23 DIAGNOSIS — E559 Vitamin D deficiency, unspecified: Secondary | ICD-10-CM

## 2025-01-23 DIAGNOSIS — I5022 Chronic systolic (congestive) heart failure: Secondary | ICD-10-CM

## 2025-01-23 DIAGNOSIS — E119 Type 2 diabetes mellitus without complications: Secondary | ICD-10-CM

## 2025-01-23 DIAGNOSIS — I10 Essential (primary) hypertension: Secondary | ICD-10-CM

## 2025-01-23 DIAGNOSIS — E669 Obesity, unspecified: Secondary | ICD-10-CM

## 2025-01-23 DIAGNOSIS — Z6838 Body mass index (BMI) 38.0-38.9, adult: Secondary | ICD-10-CM

## 2025-01-23 MED ORDER — DEXCOM G7 15 DAY SENSOR MISC
1.0000 [IU] | 1 refills | Status: AC
  Filled 2025-01-23: qty 6, 90d supply, fill #0

## 2025-01-23 MED ORDER — DEXCOM G7 RECEIVER DEVI
1.0000 [IU] | 0 refills | Status: AC
  Filled 2025-01-23: qty 1, 90d supply, fill #0

## 2025-01-23 NOTE — Progress Notes (Unsigned)
 "  SUBJECTIVE:  Chief Complaint: Obesity  Interim History: Patient mentions the meal plan was alright but not ideal for her.  She got sick of eating eggs after 3 days. Eating the same thing everyday was not the best for her.  Wondering what other options there are for meals or meal plans.  She did make protein pancakes for herself using pancake mix and protein powder.  She is wondering about how to incorporate more nutritious options when she eats out at a local [employer]. Wondering about some of the options she has seen on online as substitution for one of the meals.  Willis is here to discuss her progress with her obesity treatment plan. She is on the Category 2 Plan and states she is following her eating plan approximately 80 % of the time. She states she {CHL AMB IS/IS NOT:[id number]} exercising *** minutes *** times per week.   OBJECTIVE: Visit Diagnoses: Problem List Items Addressed This Visit       Cardiovascular and Mediastinum   CHF (congestive heart failure) (HCC)   Essential hypertension     Endocrine   Diabetes mellitus type 2, insulin  dependent (HCC) - Primary   Relevant Medications   Continuous Glucose Sensor (DEXCOM G7 15 DAY SENSOR) MISC   Continuous Glucose Receiver (DEXCOM G7 RECEIVER) DEVI     Other   Vitamin D  deficiency   Other Visit Diagnoses       Hyperlipidemia associated with type 2 diabetes mellitus (HCC)         BMI 38.0-38.9,adult         Obesity, starting BMI 39.11           Vitals Temp: 98.7 F (37.1 C) BP: 135/71 Pulse Rate: 66 SpO2: 99 %   Anthropometric Measurements Height: 5' 6.5 (1.689 m) Weight: 243 lb (110.2 kg) BMI (Calculated): 38.64 Weight at Last Visit: 246 lb Weight Lost Since Last Visit: 3 Weight Gained Since Last Visit: 0 Starting Weight: 246 lb Total Weight Loss (lbs): 3 lb (1.361 kg) Peak Weight: 341 lb   Body Composition  Body Fat %: 50.8 % Fat Mass (lbs): 123.8 lbs Muscle Mass (lbs): 114  lbs Total Body Water  (lbs): 90.8 lbs Visceral Fat Rating : 15   Other Clinical Data Today's Visit #: 2 Starting Date: 01/09/25 Comments: Cat 2     ASSESSMENT AND PLAN: Assessment & Plan Diabetes mellitus type 2, insulin  dependent (HCC)  Essential hypertension  Hyperlipidemia associated with type 2 diabetes mellitus (HCC)  Chronic systolic congestive heart failure (HCC)  Vitamin D  deficiency  BMI 38.0-38.9,adult  Obesity, starting BMI 39.11    Diet: Maryln is currently in the action stage of change. As such, her goal is to continue with weight loss efforts and has agreed to the Category 2 Plan.   Exercise:  All adults should avoid inactivity. Some activity is better than none, and adults who participate in any amount of physical activity, gain some health benefits.  Behavior Modification:  We discussed the following Behavioral Modification Strategies today: {HWW Behavior Modification:[id number]}. We discussed various medication options to help Jalexia with her weight loss efforts and we both agreed to ***.  Return in about 3 weeks (around 02/13/2025).   She was informed of the importance of frequent follow up visits to maximize her success with intensive lifestyle modifications for her multiple health conditions.  Attestation Statements:   Reviewed by clinician on day of visit: allergies, medications, problem list, medical history, surgical history, family history, social  history, and previous encounter notes.   Time spent on visit including pre-visit chart review and post-visit care and charting was *** minutes  Adelita Cho, MD "

## 2025-01-24 ENCOUNTER — Other Ambulatory Visit: Payer: Self-pay

## 2025-01-24 ENCOUNTER — Other Ambulatory Visit (HOSPITAL_COMMUNITY): Payer: Self-pay

## 2025-02-11 ENCOUNTER — Ambulatory Visit (INDEPENDENT_AMBULATORY_CARE_PROVIDER_SITE_OTHER): Admitting: Family Medicine

## 2025-03-13 ENCOUNTER — Encounter (INDEPENDENT_AMBULATORY_CARE_PROVIDER_SITE_OTHER): Admitting: Ophthalmology
